# Patient Record
Sex: Female | Born: 1945
Health system: Southern US, Community
[De-identification: ages and names within clinical notes are randomized; demographics above are authoritative.]

## PROBLEM LIST (undated history)

## (undated) DIAGNOSIS — I7123 Aneurysm of the descending thoracic aorta, without rupture: Secondary | ICD-10-CM

## (undated) DIAGNOSIS — N959 Unspecified menopausal and perimenopausal disorder: Secondary | ICD-10-CM

## (undated) DIAGNOSIS — G629 Polyneuropathy, unspecified: Secondary | ICD-10-CM

## (undated) DIAGNOSIS — J449 Chronic obstructive pulmonary disease, unspecified: Secondary | ICD-10-CM

## (undated) DIAGNOSIS — K219 Gastro-esophageal reflux disease without esophagitis: Secondary | ICD-10-CM

## (undated) DIAGNOSIS — E785 Hyperlipidemia, unspecified: Secondary | ICD-10-CM

## (undated) DIAGNOSIS — F419 Anxiety disorder, unspecified: Secondary | ICD-10-CM

## (undated) DIAGNOSIS — I2510 Atherosclerotic heart disease of native coronary artery without angina pectoris: Secondary | ICD-10-CM

## (undated) DIAGNOSIS — I739 Peripheral vascular disease, unspecified: Secondary | ICD-10-CM

## (undated) DIAGNOSIS — S0300XA Dislocation of jaw, unspecified side, initial encounter: Secondary | ICD-10-CM

## (undated) DIAGNOSIS — J45909 Unspecified asthma, uncomplicated: Secondary | ICD-10-CM

## (undated) DIAGNOSIS — I509 Heart failure, unspecified: Secondary | ICD-10-CM

## (undated) DIAGNOSIS — M62838 Other muscle spasm: Secondary | ICD-10-CM

## (undated) DIAGNOSIS — R7303 Prediabetes: Secondary | ICD-10-CM

## (undated) DIAGNOSIS — J189 Pneumonia, unspecified organism: Secondary | ICD-10-CM

## (undated) DIAGNOSIS — I890 Lymphedema, not elsewhere classified: Secondary | ICD-10-CM

## (undated) DIAGNOSIS — Z8541 Personal history of malignant neoplasm of cervix uteri: Secondary | ICD-10-CM

## (undated) DIAGNOSIS — M810 Age-related osteoporosis without current pathological fracture: Secondary | ICD-10-CM

## (undated) DIAGNOSIS — F40240 Claustrophobia: Secondary | ICD-10-CM

## (undated) DIAGNOSIS — Z972 Presence of dental prosthetic device (complete) (partial): Secondary | ICD-10-CM

## (undated) DIAGNOSIS — U071 COVID-19: Secondary | ICD-10-CM

## (undated) DIAGNOSIS — C539 Malignant neoplasm of cervix uteri, unspecified: Secondary | ICD-10-CM

## (undated) DIAGNOSIS — M1990 Unspecified osteoarthritis, unspecified site: Secondary | ICD-10-CM

## (undated) HISTORY — PX: ABDOMINAL HYSTERECTOMY: SHX81

## (undated) HISTORY — DX: Pneumonia, unspecified organism: J18.9

## (undated) HISTORY — DX: Chronic obstructive pulmonary disease, unspecified: J44.9

## (undated) HISTORY — DX: Unspecified asthma, uncomplicated: J45.909

## (undated) HISTORY — DX: Anxiety disorder, unspecified: F41.9

## (undated) HISTORY — PX: TOOTH EXTRACTION: SUR596

## (undated) HISTORY — DX: Dislocation of jaw, unspecified side, initial encounter: S03.00XA

## (undated) HISTORY — DX: Unspecified menopausal and perimenopausal disorder: N95.9

## (undated) HISTORY — PX: LOOP RECORDER REMOVAL: SHX6723

## (undated) HISTORY — PX: TONSILLECTOMY AND ADENOIDECTOMY: SUR1326

## (undated) HISTORY — DX: Personal history of malignant neoplasm of cervix uteri: Z85.41

## (undated) HISTORY — DX: Age-related osteoporosis without current pathological fracture: M81.0

## (undated) HISTORY — PX: VAGINAL HYSTERECTOMY: SHX2639

## (undated) HISTORY — DX: Claustrophobia: F40.240

---
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
## 1996-10-13 HISTORY — PX: SHOULDER ARTHROSCOPY W/ ROTATOR CUFF REPAIR: SHX2400

## 2002-10-13 HISTORY — PX: BLADDER SUSPENSION: SHX72

## 2003-10-14 HISTORY — PX: OTHER SURGICAL HISTORY: SHX169

## 2006-07-20 ENCOUNTER — Ambulatory Visit: Payer: Self-pay | Admitting: Urology

## 2006-11-02 ENCOUNTER — Ambulatory Visit: Payer: Self-pay | Admitting: Urology

## 2007-02-03 ENCOUNTER — Emergency Department: Payer: Self-pay | Admitting: Emergency Medicine

## 2011-09-12 ENCOUNTER — Ambulatory Visit: Payer: Self-pay

## 2012-03-05 DIAGNOSIS — J209 Acute bronchitis, unspecified: Secondary | ICD-10-CM | POA: Diagnosis not present

## 2012-03-05 DIAGNOSIS — J45909 Unspecified asthma, uncomplicated: Secondary | ICD-10-CM | POA: Diagnosis not present

## 2012-03-17 DIAGNOSIS — J209 Acute bronchitis, unspecified: Secondary | ICD-10-CM | POA: Diagnosis not present

## 2012-03-17 DIAGNOSIS — J449 Chronic obstructive pulmonary disease, unspecified: Secondary | ICD-10-CM | POA: Diagnosis not present

## 2012-03-17 DIAGNOSIS — J018 Other acute sinusitis: Secondary | ICD-10-CM | POA: Diagnosis not present

## 2012-04-02 DIAGNOSIS — R059 Cough, unspecified: Secondary | ICD-10-CM | POA: Diagnosis not present

## 2012-04-02 DIAGNOSIS — R0602 Shortness of breath: Secondary | ICD-10-CM | POA: Diagnosis not present

## 2012-04-02 DIAGNOSIS — R05 Cough: Secondary | ICD-10-CM | POA: Diagnosis not present

## 2012-04-07 DIAGNOSIS — J45901 Unspecified asthma with (acute) exacerbation: Secondary | ICD-10-CM | POA: Diagnosis not present

## 2012-04-07 DIAGNOSIS — J019 Acute sinusitis, unspecified: Secondary | ICD-10-CM | POA: Diagnosis not present

## 2012-04-07 DIAGNOSIS — Z23 Encounter for immunization: Secondary | ICD-10-CM | POA: Diagnosis not present

## 2012-04-07 DIAGNOSIS — J309 Allergic rhinitis, unspecified: Secondary | ICD-10-CM | POA: Diagnosis not present

## 2012-09-14 DIAGNOSIS — Z01419 Encounter for gynecological examination (general) (routine) without abnormal findings: Secondary | ICD-10-CM | POA: Diagnosis not present

## 2012-09-14 DIAGNOSIS — J45909 Unspecified asthma, uncomplicated: Secondary | ICD-10-CM | POA: Diagnosis not present

## 2012-09-14 DIAGNOSIS — Z0000 Encounter for general adult medical examination without abnormal findings: Secondary | ICD-10-CM | POA: Diagnosis not present

## 2012-09-14 DIAGNOSIS — Z1289 Encounter for screening for malignant neoplasm of other sites: Secondary | ICD-10-CM | POA: Diagnosis not present

## 2012-09-14 DIAGNOSIS — Z13220 Encounter for screening for lipoid disorders: Secondary | ICD-10-CM | POA: Diagnosis not present

## 2012-09-14 DIAGNOSIS — F17200 Nicotine dependence, unspecified, uncomplicated: Secondary | ICD-10-CM | POA: Diagnosis not present

## 2012-09-14 DIAGNOSIS — R32 Unspecified urinary incontinence: Secondary | ICD-10-CM | POA: Diagnosis not present

## 2012-10-20 DIAGNOSIS — Z23 Encounter for immunization: Secondary | ICD-10-CM | POA: Diagnosis not present

## 2012-12-22 DIAGNOSIS — R1011 Right upper quadrant pain: Secondary | ICD-10-CM | POA: Diagnosis not present

## 2012-12-22 DIAGNOSIS — R109 Unspecified abdominal pain: Secondary | ICD-10-CM | POA: Diagnosis not present

## 2013-08-10 DIAGNOSIS — Z23 Encounter for immunization: Secondary | ICD-10-CM | POA: Diagnosis not present

## 2013-09-21 DIAGNOSIS — R1011 Right upper quadrant pain: Secondary | ICD-10-CM | POA: Diagnosis not present

## 2013-09-21 DIAGNOSIS — Z1272 Encounter for screening for malignant neoplasm of vagina: Secondary | ICD-10-CM | POA: Diagnosis not present

## 2013-09-21 DIAGNOSIS — F17200 Nicotine dependence, unspecified, uncomplicated: Secondary | ICD-10-CM | POA: Diagnosis not present

## 2013-09-21 DIAGNOSIS — Z8541 Personal history of malignant neoplasm of cervix uteri: Secondary | ICD-10-CM | POA: Diagnosis not present

## 2013-09-21 DIAGNOSIS — R252 Cramp and spasm: Secondary | ICD-10-CM | POA: Diagnosis not present

## 2013-09-21 DIAGNOSIS — E785 Hyperlipidemia, unspecified: Secondary | ICD-10-CM | POA: Diagnosis not present

## 2013-09-21 DIAGNOSIS — J449 Chronic obstructive pulmonary disease, unspecified: Secondary | ICD-10-CM | POA: Diagnosis not present

## 2013-09-21 DIAGNOSIS — Z87891 Personal history of nicotine dependence: Secondary | ICD-10-CM | POA: Diagnosis not present

## 2013-09-21 DIAGNOSIS — Z0000 Encounter for general adult medical examination without abnormal findings: Secondary | ICD-10-CM | POA: Diagnosis not present

## 2013-09-21 DIAGNOSIS — Z1331 Encounter for screening for depression: Secondary | ICD-10-CM | POA: Diagnosis not present

## 2013-09-21 DIAGNOSIS — M1990 Unspecified osteoarthritis, unspecified site: Secondary | ICD-10-CM | POA: Diagnosis not present

## 2014-02-02 DIAGNOSIS — D485 Neoplasm of uncertain behavior of skin: Secondary | ICD-10-CM | POA: Diagnosis not present

## 2014-02-02 DIAGNOSIS — L851 Acquired keratosis [keratoderma] palmaris et plantaris: Secondary | ICD-10-CM | POA: Diagnosis not present

## 2014-02-02 DIAGNOSIS — L259 Unspecified contact dermatitis, unspecified cause: Secondary | ICD-10-CM | POA: Diagnosis not present

## 2014-02-02 DIAGNOSIS — L821 Other seborrheic keratosis: Secondary | ICD-10-CM | POA: Diagnosis not present

## 2014-02-02 DIAGNOSIS — D239 Other benign neoplasm of skin, unspecified: Secondary | ICD-10-CM | POA: Diagnosis not present

## 2014-02-02 DIAGNOSIS — L578 Other skin changes due to chronic exposure to nonionizing radiation: Secondary | ICD-10-CM | POA: Diagnosis not present

## 2014-03-13 DIAGNOSIS — L578 Other skin changes due to chronic exposure to nonionizing radiation: Secondary | ICD-10-CM | POA: Diagnosis not present

## 2014-03-13 DIAGNOSIS — L723 Sebaceous cyst: Secondary | ICD-10-CM | POA: Diagnosis not present

## 2014-03-13 DIAGNOSIS — L819 Disorder of pigmentation, unspecified: Secondary | ICD-10-CM | POA: Diagnosis not present

## 2014-03-13 DIAGNOSIS — L820 Inflamed seborrheic keratosis: Secondary | ICD-10-CM | POA: Diagnosis not present

## 2014-03-24 DIAGNOSIS — T148 Other injury of unspecified body region: Secondary | ICD-10-CM | POA: Diagnosis not present

## 2014-03-24 DIAGNOSIS — E785 Hyperlipidemia, unspecified: Secondary | ICD-10-CM | POA: Diagnosis not present

## 2014-03-24 DIAGNOSIS — J449 Chronic obstructive pulmonary disease, unspecified: Secondary | ICD-10-CM | POA: Diagnosis not present

## 2014-03-24 DIAGNOSIS — R7989 Other specified abnormal findings of blood chemistry: Secondary | ICD-10-CM | POA: Diagnosis not present

## 2014-05-15 DIAGNOSIS — L820 Inflamed seborrheic keratosis: Secondary | ICD-10-CM | POA: Diagnosis not present

## 2014-05-15 DIAGNOSIS — L578 Other skin changes due to chronic exposure to nonionizing radiation: Secondary | ICD-10-CM | POA: Diagnosis not present

## 2014-06-16 DIAGNOSIS — H2510 Age-related nuclear cataract, unspecified eye: Secondary | ICD-10-CM | POA: Diagnosis not present

## 2014-08-17 DIAGNOSIS — L820 Inflamed seborrheic keratosis: Secondary | ICD-10-CM | POA: Diagnosis not present

## 2014-08-17 DIAGNOSIS — L578 Other skin changes due to chronic exposure to nonionizing radiation: Secondary | ICD-10-CM | POA: Diagnosis not present

## 2014-09-25 DIAGNOSIS — Z0000 Encounter for general adult medical examination without abnormal findings: Secondary | ICD-10-CM | POA: Diagnosis not present

## 2014-09-25 DIAGNOSIS — F17210 Nicotine dependence, cigarettes, uncomplicated: Secondary | ICD-10-CM | POA: Diagnosis not present

## 2014-09-25 DIAGNOSIS — J449 Chronic obstructive pulmonary disease, unspecified: Secondary | ICD-10-CM | POA: Diagnosis not present

## 2014-09-25 DIAGNOSIS — Z8541 Personal history of malignant neoplasm of cervix uteri: Secondary | ICD-10-CM | POA: Diagnosis not present

## 2014-09-25 DIAGNOSIS — E785 Hyperlipidemia, unspecified: Secondary | ICD-10-CM | POA: Diagnosis not present

## 2014-09-25 DIAGNOSIS — S61200A Unspecified open wound of right index finger without damage to nail, initial encounter: Secondary | ICD-10-CM | POA: Diagnosis not present

## 2014-09-25 DIAGNOSIS — Z8540 Personal history of malignant neoplasm of unspecified female genital organ: Secondary | ICD-10-CM | POA: Diagnosis not present

## 2014-12-25 DIAGNOSIS — N951 Menopausal and female climacteric states: Secondary | ICD-10-CM | POA: Diagnosis not present

## 2014-12-25 DIAGNOSIS — R350 Frequency of micturition: Secondary | ICD-10-CM | POA: Diagnosis not present

## 2015-02-21 DIAGNOSIS — L720 Epidermal cyst: Secondary | ICD-10-CM | POA: Diagnosis not present

## 2015-02-21 DIAGNOSIS — L821 Other seborrheic keratosis: Secondary | ICD-10-CM | POA: Diagnosis not present

## 2015-02-21 DIAGNOSIS — L820 Inflamed seborrheic keratosis: Secondary | ICD-10-CM | POA: Diagnosis not present

## 2015-02-21 DIAGNOSIS — L578 Other skin changes due to chronic exposure to nonionizing radiation: Secondary | ICD-10-CM | POA: Diagnosis not present

## 2015-05-01 ENCOUNTER — Other Ambulatory Visit: Payer: Self-pay

## 2015-05-01 MED ORDER — ALBUTEROL SULFATE HFA 108 (90 BASE) MCG/ACT IN AERS: 2.0000 | INHALATION_SPRAY | Freq: Four times a day (QID) | RESPIRATORY_TRACT | Status: DC | PRN

## 2015-05-01 NOTE — Telephone Encounter (Signed)
Patient was last seen on 12/25/14, practice partner number [place] [id number], and pharmacy is [facility].

## 2015-06-05 ENCOUNTER — Telehealth: Payer: Self-pay | Admitting: Unknown Physician Specialty

## 2015-06-05 DIAGNOSIS — N63 Unspecified lump in unspecified breast: Secondary | ICD-10-CM

## 2015-06-05 NOTE — Telephone Encounter (Signed)
She needs to be seen.

## 2015-06-05 NOTE — Telephone Encounter (Signed)
[place], it doesn't look like pt has had a mammogram in a while.  In addition to seeing her, I would like to schedule one for her.  I put the order in. Please let her know.

## 2015-06-05 NOTE — Telephone Encounter (Signed)
Pt scheduled for 06/10/18 @ 1pm. Thanks

## 2015-06-05 NOTE — Telephone Encounter (Signed)
Pt called wants to let you know she has found a little (pea size) knot in her right breast and wants to discuss going for a mammogram. Please call pt ASAP. Pt 's # is  [phone]. Thanks.

## 2015-06-06 ENCOUNTER — Other Ambulatory Visit: Payer: Self-pay

## 2015-06-06 DIAGNOSIS — N63 Unspecified lump in unspecified breast: Secondary | ICD-10-CM

## 2015-06-06 NOTE — Telephone Encounter (Signed)
Tried to call patient but a gentleman answered the phone and stated the patient was not in. Asked for patient to please call me back when she could.

## 2015-06-06 NOTE — Telephone Encounter (Signed)
Patient returned call and stated she would schedule mammogram herself.

## 2015-06-11 ENCOUNTER — Encounter: Payer: Self-pay | Admitting: Unknown Physician Specialty

## 2015-06-11 ENCOUNTER — Ambulatory Visit (INDEPENDENT_AMBULATORY_CARE_PROVIDER_SITE_OTHER): Payer: Medicare Other | Admitting: Unknown Physician Specialty

## 2015-06-11 ENCOUNTER — Encounter: Payer: Self-pay | Admitting: *Deleted

## 2015-06-11 VITALS — Temp 98.5°F | Ht 68.3 in | Wt 165.1 lb

## 2015-06-11 DIAGNOSIS — N63 Unspecified lump in unspecified breast: Secondary | ICD-10-CM

## 2015-06-11 NOTE — Progress Notes (Signed)
   Temp(Src) 98.5 F (36.9 C)  Ht 5' 8.3" (1.735 m)  Wt 165 lb 1.6 oz (74.889 kg)  BMI 24.88 kg/m2  SpO2 98%   Subjective:    Patient ID: Sharon [name], female    DOB: 1945/12/23, 69 y.o.   MRN: [id number]  HPI: Sharon [name] is a 69 y.o. female  Chief Complaint  Patient presents with  . Breast Mass    pt states she has a lump on right breast, has mammogram scheduled for Friday 06/15/15   Pt noticed a breast mass right breast last week.  No pain, it itches, and calls it pea size lateral aspect.    Relevant past medical, surgical, family and social history reviewed and updated as indicated. Interim medical history since our last visit reviewed. Allergies and medications reviewed and updated.  Review of Systems  Per HPI unless specifically indicated above     Objective:    Temp(Src) 98.5 F (36.9 C)  Ht 5' 8.3" (1.735 m)  Wt 165 lb 1.6 oz (74.889 kg)  BMI 24.88 kg/m2  SpO2 98%  Wt Readings from Last 3 Encounters:  06/11/15 165 lb 1.6 oz (74.889 kg)  12/25/14 162 lb (73.483 kg)    Physical Exam  Constitutional: She is oriented to person, place, and time. She appears well-developed and well-nourished. No distress.  HENT:  Head: Normocephalic and atraumatic.  Eyes: Conjunctivae and lids are normal. Right eye exhibits no discharge. Left eye exhibits no discharge. No scleral icterus.  Cardiovascular: Normal rate and regular rhythm.   Pulmonary/Chest: Effort normal. No respiratory distress. Right breast exhibits no inverted nipple, no mass, no nipple discharge, no skin change and no tenderness. Left breast exhibits no inverted nipple, no mass, no nipple discharge, no skin change and no tenderness. Breasts are symmetrical.  No mass appreciated.    Abdominal: Normal appearance and bowel sounds are normal. She exhibits no distension. There is no splenomegaly or hepatomegaly. There is no tenderness.  Musculoskeletal: Normal range of motion.  Neurological: She is alert and oriented  to person, place, and time.  Skin: Skin is intact. No rash noted. No pallor.  Psychiatric: She has a normal mood and affect. Her behavior is normal. Judgment and thought content normal.  Vitals reviewed.   No results found for this or any previous visit.    Assessment & Plan:   Problem List Items Addressed This Visit    None    Visit Diagnoses    Breast lump    -  Primary    I don't appreciate lump but mammogram is ordered.  Will refer to Dr. Fleet Contras for further evaluation as she appreciates a change.      Relevant Orders    Ambulatory referral to General Surgery        Follow up plan: Return for Physical in December.

## 2015-06-15 DIAGNOSIS — R921 Mammographic calcification found on diagnostic imaging of breast: Secondary | ICD-10-CM | POA: Diagnosis not present

## 2015-06-15 DIAGNOSIS — N63 Unspecified lump in breast: Secondary | ICD-10-CM | POA: Diagnosis not present

## 2015-06-21 ENCOUNTER — Ambulatory Visit (INDEPENDENT_AMBULATORY_CARE_PROVIDER_SITE_OTHER): Payer: Medicare Other | Admitting: General Surgery

## 2015-06-21 ENCOUNTER — Encounter: Payer: Self-pay | Admitting: General Surgery

## 2015-06-21 ENCOUNTER — Other Ambulatory Visit: Payer: No Typology Code available for payment source

## 2015-06-21 VITALS — BP 116/58 | HR 84 | Resp 14 | Ht 69.0 in | Wt 165.0 lb

## 2015-06-21 DIAGNOSIS — N63 Unspecified lump in breast: Secondary | ICD-10-CM | POA: Diagnosis not present

## 2015-06-21 DIAGNOSIS — N6310 Unspecified lump in the right breast, unspecified quadrant: Secondary | ICD-10-CM

## 2015-06-21 NOTE — Patient Instructions (Addendum)
The patient is aware to call back for any questions or concerns. Follow up as needed or if symptoms worsen. Continue self breast exams. Call office for any new breast issues or concerns.

## 2015-06-21 NOTE — Progress Notes (Signed)
Patient ID: Sharon [name], female   DOB: Oct 05, 1946, 69 y.o.   MRN: [id number]  Chief Complaint  Patient presents with  . Breast Problem    HPI Sharon [name] is a 69 y.o. female.  who presents for a breast evaluation for a right breast mass. The most recent mammogram and ultrasound was done on 06-15-15. Patient does perform regular self breast checks and gets regular mammograms done. She first noticed a lump in her right breast about 3 weeks ago. States it is about "pea" size. She wears a under wire bra and that is exactly where the lump is. She does know she has a cyst in her left breast and it becomes tender when she drinks too much caffeine. She does remember that she has been sleeping in her under wire bra instead of her sleep bra. Denies family history of any cancer in the family. Denies breast injury of trauma.   HPI  Past Medical History  Diagnosis Date  . Asthma   . COPD (chronic obstructive pulmonary disease)   . Anxiety   . Osteoporosis   . History of cervical cancer   . TMJ (dislocation of temporomandibular joint)   . Menopausal disorder   . Pneumonia 1960    Past Surgical History  Procedure Laterality Date  . Abdominal hysterectomy    . Tonsillectomy and adenoidectomy    . Bladder botox  2005  . Shoulder arthroscopy w/ rotator cuff repair Right 1998  . Abdominal hysterectomy  1970's  . Bladder suspension  2004    Family History  Problem Relation Age of Onset  . Diabetes Mother   . Heart disease Mother   . Stroke Mother   . Stroke Maternal Grandmother     Social History Social History  Substance Use Topics  . Smoking status: Current Some Day Smoker    Types: Cigarettes  . Smokeless tobacco: Never Used  . Alcohol Use: 0.0 oz/week    0 Standard drinks or equivalent per week     Comment: occasionally    Allergies  Allergen Reactions  . Ibuprofen Rash    Mouth swelling  . Latex Rash  . Naprosyn [Naproxen] Rash    Mouth swelling    Current Outpatient  Prescriptions  Medication Sig Dispense Refill  . albuterol (PROVENTIL HFA;VENTOLIN HFA) 108 (90 BASE) MCG/ACT inhaler Inhale 2 puffs into the lungs every 6 (six) hours as needed for wheezing or shortness of breath. 1 Inhaler 2  . aspirin EC 81 MG tablet Take 81 mg by mouth daily.    . budesonide-formoterol (SYMBICORT) 160-4.5 MCG/ACT inhaler Inhale 2 puffs into the lungs 2 (two) times daily.    Marland Kitchen estradiol (VIVELLE-DOT) 0.1 MG/24HR patch Place 1 patch onto the skin 2 (two) times a week.    . fluticasone (FLONASE) 50 MCG/ACT nasal spray Place 2 sprays into both nostrils daily.    . Multiple Vitamin (MULTIVITAMIN) tablet Take 1 tablet by mouth daily.    . SUPER GARLIC PO Take 2 tablets by mouth daily.     No current facility-administered medications for this visit.    Review of Systems Review of Systems  Constitutional: Negative.   Respiratory: Negative.   Cardiovascular: Negative.     Blood pressure 116/58, pulse 84, resp. rate 14, height 5\' 9"  (1.753 m), weight 165 lb (74.844 kg).  Physical Exam Physical Exam  Constitutional: She is oriented to person, place, and time. She appears well-developed and well-nourished.  HENT:  Mouth/Throat: Oropharynx is clear and  moist.  Eyes: Conjunctivae are normal. No scleral icterus.  Neck: Neck supple.  Cardiovascular: Normal rate, regular rhythm and normal heart sounds.   Pulmonary/Chest: Effort normal and breath sounds normal. Right breast exhibits no inverted nipple, no mass, no nipple discharge, no skin change and no tenderness. Left breast exhibits no inverted nipple, no mass, no nipple discharge, no skin change and no tenderness.    1.5 cm de-pigmented area left breast at edge of areolar.  Lymphadenopathy:    She has no cervical adenopathy.    She has no axillary adenopathy.  Neurological: She is alert and oriented to person, place, and time.  Skin: Skin is warm and dry.  Psychiatric: Her behavior is normal.    Data  Reviewed Bilateral diagnostic mammograms dated 06/15/2015 were reviewed. At the site of a BB placed prior to the mammogram a stable, superficial lymph node was identified. There is reported to be a mass in the retroareolar area of the left breast measuring 8 mm in diameter.  Left breast ultrasound of the same date showed 2 anechoic lesions consistent with simple cyst. BI-RADS-2.  Review of the 2012 and 2016 mammograms showed at most a 1 mm difference in size of the right upper outer quadrant lymph node.  Ultrasound examination of the right breast in the area of palpable thickening showed a 0.36 x 0.37 x 0.5 7 cm smoothly marginated structure with a hyperechoic core consistent with a benign lymph node. This matches the mammographic imaging.  Assessment    Unremarkable right axillary tail lymph node without evidence of pathology. Left breast cyst identified on ultrasound. (asymptomatic) and nonpalpable.     Plan    The patient has been encouraged to do monthly self examination but otherwise avoid manipulation of the small lymph node in the right axillary tail. No additional follow-up is required. She should however have annual screening mammograms rather than on a 4 year cycle.    Follow up as needed or if symptoms worsen. PCP:  Lily Lovings 06/22/2015, 9:38 AM

## 2015-06-22 DIAGNOSIS — N6312 Unspecified lump in the right breast, upper inner quadrant: Secondary | ICD-10-CM | POA: Insufficient documentation

## 2015-07-23 ENCOUNTER — Other Ambulatory Visit: Payer: Self-pay | Admitting: Unknown Physician Specialty

## 2015-08-13 DIAGNOSIS — Z23 Encounter for immunization: Secondary | ICD-10-CM | POA: Diagnosis not present

## 2015-09-20 ENCOUNTER — Encounter: Payer: Self-pay | Admitting: Unknown Physician Specialty

## 2015-09-28 ENCOUNTER — Encounter: Payer: Self-pay | Admitting: Unknown Physician Specialty

## 2015-10-17 ENCOUNTER — Ambulatory Visit (INDEPENDENT_AMBULATORY_CARE_PROVIDER_SITE_OTHER): Payer: Medicare Other | Admitting: Unknown Physician Specialty

## 2015-10-17 ENCOUNTER — Encounter: Payer: Self-pay | Admitting: Unknown Physician Specialty

## 2015-10-17 VITALS — BP 117/72 | HR 92 | Temp 98.4°F | Ht 68.4 in | Wt 166.4 lb

## 2015-10-17 DIAGNOSIS — J449 Chronic obstructive pulmonary disease, unspecified: Secondary | ICD-10-CM

## 2015-10-17 DIAGNOSIS — H6091 Unspecified otitis externa, right ear: Secondary | ICD-10-CM

## 2015-10-17 DIAGNOSIS — E2839 Other primary ovarian failure: Secondary | ICD-10-CM | POA: Diagnosis not present

## 2015-10-17 DIAGNOSIS — Z0000 Encounter for general adult medical examination without abnormal findings: Secondary | ICD-10-CM

## 2015-10-17 DIAGNOSIS — J432 Centrilobular emphysema: Secondary | ICD-10-CM | POA: Insufficient documentation

## 2015-10-17 MED ORDER — BUDESONIDE-FORMOTEROL FUMARATE 160-4.5 MCG/ACT IN AERO: 2.0000 | INHALATION_SPRAY | Freq: Two times a day (BID) | RESPIRATORY_TRACT | Status: DC

## 2015-10-17 MED ORDER — NEOMYCIN-POLYMYXIN-HC 3.5-10000-1 OT SOLN: 4.0000 [drp] | Freq: Four times a day (QID) | OTIC | Status: DC

## 2015-10-17 MED ORDER — ALBUTEROL SULFATE HFA 108 (90 BASE) MCG/ACT IN AERS
2.0000 | INHALATION_SPRAY | Freq: Four times a day (QID) | RESPIRATORY_TRACT | Status: DC | PRN
Start: 2015-10-17 — End: 2017-05-07

## 2015-10-17 MED ORDER — ESTRADIOL 0.1 MG/24HR TD PTTW: 1.0000 | MEDICATED_PATCH | TRANSDERMAL | Status: DC

## 2015-10-17 NOTE — Progress Notes (Signed)
BP 117/72 mmHg  Pulse 92  Temp(Src) 98.4 F (36.9 C)  Ht 5' 8.4" (1.737 m)  Wt 166 lb 6.4 oz (75.479 kg)  BMI 25.02 kg/m2  SpO2 97%  LMP  (LMP Unknown)   Subjective:    Patient ID: Sharon [name], female    DOB: 1946/08/20, 70 y.o.   MRN: [id number]  HPI: Sharon [name] is a 70 y.o. female  Chief Complaint  Patient presents with  . Medicare Wellness  . Ear Fullness    pt states she feels like her right ear is full   Functional Status Survey: Is the patient deaf or have difficulty hearing?: Yes (pt states she has some trouble hearing in her right ear) Does the patient have difficulty seeing, even when wearing glasses/contacts?: Yes (pt states she has trouble seeing up close) Does the patient have difficulty concentrating, remembering, or making decisions?: No Does the patient have difficulty walking or climbing stairs?: No Does the patient have difficulty dressing or bathing?: No Does the patient have difficulty doing errands alone such as visiting a doctor's office or shopping?: No  Depression screen PHQ 2/9 10/17/2015  Decreased Interest 0  Down, Depressed, Hopeless 0  PHQ - 2 Score 0    Fall Risk  10/17/2015  Falls in the past year? No    Pt is able to perform complex mental tasks, recognize clock face, recognize time and do a 3 item recall.    COPD Feels symptoms are well controlled Using medications without problems: On Symbicort and Albuterol very occasionally Night time symptoms: no ER visits since last visit:none Increased cough: no Increased SOB:no Using O2:no  Menopause Uses patch on occasion with hot flashes but not twice a week.  Relevant past medical, surgical, family and social history reviewed and updated as indicated. Interim medical history since our last visit reviewed. Allergies and medications reviewed and updated.  Review of Systems  Constitutional: Negative.   HENT: Negative.   Eyes: Negative.   Respiratory: Negative.   Cardiovascular:  Negative.   Gastrointestinal: Negative.   Endocrine: Negative.   Genitourinary: Negative.   Musculoskeletal: Negative.   Skin: Negative.   Allergic/Immunologic: Negative.   Neurological: Negative.   Hematological: Negative.   Psychiatric/Behavioral: Negative.     Per HPI unless specifically indicated above     Objective:    BP 117/72 mmHg  Pulse 92  Temp(Src) 98.4 F (36.9 C)  Ht 5' 8.4" (1.737 m)  Wt 166 lb 6.4 oz (75.479 kg)  BMI 25.02 kg/m2  SpO2 97%  LMP  (LMP Unknown)  Wt Readings from Last 3 Encounters:  10/17/15 166 lb 6.4 oz (75.479 kg)  06/21/15 165 lb (74.844 kg)  06/11/15 165 lb 1.6 oz (74.889 kg)    Physical Exam  Constitutional: She is oriented to person, place, and time. She appears well-developed and well-nourished.  HENT:  Head: Normocephalic and atraumatic.  Right Ear: Tympanic membrane normal. There is swelling.  Left Ear: Tympanic membrane and ear canal normal.  Eyes: Pupils are equal, round, and reactive to light. Right eye exhibits no discharge. Left eye exhibits no discharge. No scleral icterus.  Neck: Normal range of motion. Neck supple. Carotid bruit is not present. No thyromegaly present.  Cardiovascular: Normal rate, regular rhythm and normal heart sounds.  Exam reveals no gallop and no friction rub.   No murmur heard. Pulmonary/Chest: Effort normal and breath sounds normal. No respiratory distress. She has no wheezes. She has no rales.  Abdominal: Soft. Bowel  sounds are normal. There is no tenderness. There is no rebound.  Genitourinary: No breast swelling, tenderness or discharge.  Musculoskeletal: Normal range of motion.  Lymphadenopathy:    She has no cervical adenopathy.  Neurological: She is alert and oriented to person, place, and time.  Skin: Skin is warm, dry and intact. No rash noted.  Psychiatric: She has a normal mood and affect. Her speech is normal and behavior is normal. Judgment and thought content normal. Cognition and memory  are normal.    No results found for this or any previous visit.    Assessment & Plan:   Problem List Items Addressed This Visit      Unprioritized   COPD (chronic obstructive pulmonary disease) ([place])    Stable, continue present medications.        Relevant Medications   budesonide-formoterol (SYMBICORT) 160-4.5 MCG/ACT inhaler   albuterol (PROVENTIL HFA;VENTOLIN HFA) 108 (90 Base) MCG/ACT inhaler    Other Visit Diagnoses    Annual physical exam    -  Primary    Relevant Orders    Cologuard    Hepatitis C antibody    Ovarian failure        Relevant Orders    DG Bone Density    Otitis externa, right        Rx for polytrim        Follow up plan: Return in about 1 year (around 10/16/2016).

## 2015-10-17 NOTE — Assessment & Plan Note (Signed)
Stable, continue present medications.   

## 2015-10-18 LAB — HEPATITIS C ANTIBODY

## 2015-11-13 DIAGNOSIS — Z1212 Encounter for screening for malignant neoplasm of rectum: Secondary | ICD-10-CM | POA: Diagnosis not present

## 2015-11-13 DIAGNOSIS — Z1211 Encounter for screening for malignant neoplasm of colon: Secondary | ICD-10-CM | POA: Diagnosis not present

## 2015-11-13 LAB — COLOGUARD: Cologuard: POSITIVE

## 2015-11-23 ENCOUNTER — Telehealth: Payer: Self-pay | Admitting: Unknown Physician Specialty

## 2015-11-23 DIAGNOSIS — Z9189 Other specified personal risk factors, not elsewhere classified: Secondary | ICD-10-CM

## 2015-11-23 NOTE — Telephone Encounter (Signed)
Discussed.

## 2015-11-23 NOTE — Telephone Encounter (Signed)
Called pt about cologuard positive test.  Refer to Dr. Allen Norris for diagnostic testing.

## 2015-11-30 ENCOUNTER — Telehealth: Payer: Self-pay

## 2015-11-30 ENCOUNTER — Other Ambulatory Visit: Payer: Self-pay

## 2015-11-30 NOTE — Telephone Encounter (Signed)
Gastroenterology Pre-Procedure Review  Request Date: 12/13/15 Requesting Physician: Kathrine Haddock, NP  PATIENT REVIEW QUESTIONS: The patient responded to the following health history questions as indicated:    1. Are you having any GI issues? no 2. Do you have a personal history of Polyps? no 3. Do you have a family history of Colon Cancer or Polyps? no 4. Diabetes Mellitus? no 5. Joint replacements in the past 12 months?no 6. Major health problems in the past 3 months?no 7. Any artificial heart valves, MVP, or defibrillator?no    MEDICATIONS & ALLERGIES:    Patient reports the following regarding taking any anticoagulation/antiplatelet therapy:   Plavix, Coumadin, Eliquis, Xarelto, Lovenox, Pradaxa, Brilinta, or Effient? no Aspirin? yes (ASA 81mg )  Patient confirms/reports the following medications:  Current Outpatient Prescriptions  Medication Sig Dispense Refill  . albuterol (PROVENTIL HFA;VENTOLIN HFA) 108 (90 Base) MCG/ACT inhaler Inhale 2 puffs into the lungs every 6 (six) hours as needed for wheezing or shortness of breath. 1 Inhaler 2  . aspirin EC 81 MG tablet Take 81 mg by mouth daily.    . budesonide-formoterol (SYMBICORT) 160-4.5 MCG/ACT inhaler Inhale 2 puffs into the lungs 2 (two) times daily. 10.2 g 3  . estradiol (VIVELLE-DOT) 0.1 MG/24HR patch Place 1 patch (0.1 mg total) onto the skin 2 (two) times a week. 8 patch 12  . fluticasone (FLONASE) 50 MCG/ACT nasal spray Place 2 sprays into both nostrils daily.    . Multiple Vitamin (MULTIVITAMIN) tablet Take 1 tablet by mouth daily.    Marland Kitchen neomycin-polymyxin-hydrocortisone (CORTISPORIN) otic solution Place 4 drops into the right ear 4 (four) times daily. 10 mL 0  . SUPER GARLIC PO Take 2 tablets by mouth daily.     No current facility-administered medications for this visit.    Patient confirms/reports the following allergies:  Allergies  Allergen Reactions  . Ibuprofen Rash    Mouth swelling  . Latex Rash  . Naprosyn  [Naproxen] Rash    Mouth swelling    No orders of the defined types were placed in this encounter.    AUTHORIZATION INFORMATION Primary Insurance: 1D#: Group #:  Secondary Insurance: 1D#: Group #:  SCHEDULE INFORMATION: Date: 12/13/15 Time: Location: [place]

## 2015-12-05 ENCOUNTER — Encounter: Payer: Self-pay | Admitting: *Deleted

## 2015-12-11 NOTE — Discharge Instructions (Signed)

## 2015-12-13 ENCOUNTER — Ambulatory Visit: Payer: Medicare Other | Admitting: Anesthesiology

## 2015-12-13 ENCOUNTER — Ambulatory Visit
Admission: RE | Admit: 2015-12-13 | Discharge: 2015-12-13 | Disposition: A | Discharge status: Home / Self Care | Payer: Medicare Other | Source: Ambulatory Visit | Attending: Gastroenterology | Admitting: Gastroenterology

## 2015-12-13 ENCOUNTER — Encounter
Admission: RE | Disposition: A | Discharge status: Home / Self Care | Payer: Self-pay | Source: Ambulatory Visit | Attending: Gastroenterology

## 2015-12-13 DIAGNOSIS — F17210 Nicotine dependence, cigarettes, uncomplicated: Secondary | ICD-10-CM | POA: Diagnosis not present

## 2015-12-13 DIAGNOSIS — F419 Anxiety disorder, unspecified: Secondary | ICD-10-CM | POA: Diagnosis not present

## 2015-12-13 DIAGNOSIS — M26609 Unspecified temporomandibular joint disorder, unspecified side: Secondary | ICD-10-CM | POA: Insufficient documentation

## 2015-12-13 DIAGNOSIS — Z823 Family history of stroke: Secondary | ICD-10-CM | POA: Insufficient documentation

## 2015-12-13 DIAGNOSIS — Z7982 Long term (current) use of aspirin: Secondary | ICD-10-CM | POA: Insufficient documentation

## 2015-12-13 DIAGNOSIS — K641 Second degree hemorrhoids: Secondary | ICD-10-CM | POA: Insufficient documentation

## 2015-12-13 DIAGNOSIS — Z8541 Personal history of malignant neoplasm of cervix uteri: Secondary | ICD-10-CM | POA: Insufficient documentation

## 2015-12-13 DIAGNOSIS — M810 Age-related osteoporosis without current pathological fracture: Secondary | ICD-10-CM | POA: Diagnosis not present

## 2015-12-13 DIAGNOSIS — M19041 Primary osteoarthritis, right hand: Secondary | ICD-10-CM | POA: Insufficient documentation

## 2015-12-13 DIAGNOSIS — M19042 Primary osteoarthritis, left hand: Secondary | ICD-10-CM | POA: Insufficient documentation

## 2015-12-13 DIAGNOSIS — Z833 Family history of diabetes mellitus: Secondary | ICD-10-CM | POA: Diagnosis not present

## 2015-12-13 DIAGNOSIS — M479 Spondylosis, unspecified: Secondary | ICD-10-CM | POA: Insufficient documentation

## 2015-12-13 DIAGNOSIS — Z8249 Family history of ischemic heart disease and other diseases of the circulatory system: Secondary | ICD-10-CM | POA: Insufficient documentation

## 2015-12-13 DIAGNOSIS — Z79899 Other long term (current) drug therapy: Secondary | ICD-10-CM | POA: Diagnosis not present

## 2015-12-13 DIAGNOSIS — K921 Melena: Secondary | ICD-10-CM | POA: Diagnosis not present

## 2015-12-13 DIAGNOSIS — D125 Benign neoplasm of sigmoid colon: Secondary | ICD-10-CM | POA: Diagnosis not present

## 2015-12-13 DIAGNOSIS — J449 Chronic obstructive pulmonary disease, unspecified: Secondary | ICD-10-CM | POA: Insufficient documentation

## 2015-12-13 DIAGNOSIS — Z7952 Long term (current) use of systemic steroids: Secondary | ICD-10-CM | POA: Diagnosis not present

## 2015-12-13 DIAGNOSIS — K5730 Diverticulosis of large intestine without perforation or abscess without bleeding: Secondary | ICD-10-CM | POA: Insufficient documentation

## 2015-12-13 DIAGNOSIS — R195 Other fecal abnormalities: Secondary | ICD-10-CM | POA: Diagnosis not present

## 2015-12-13 HISTORY — DX: Other muscle spasm: M62.838

## 2015-12-13 HISTORY — PX: POLYPECTOMY: SHX5525

## 2015-12-13 HISTORY — DX: Presence of dental prosthetic device (complete) (partial): Z97.2

## 2015-12-13 HISTORY — DX: Unspecified osteoarthritis, unspecified site: M19.90

## 2015-12-13 HISTORY — PX: COLONOSCOPY WITH PROPOFOL: SHX5780

## 2015-12-13 SURGERY — COLONOSCOPY WITH PROPOFOL
Anesthesia: Monitor Anesthesia Care | Site: Rectum | Wound class: Contaminated

## 2015-12-13 MED ORDER — PROPOFOL 10 MG/ML IV BOLUS
INTRAVENOUS | Status: DC | PRN
  Administered 2015-12-13 (×4): 60 mg via INTRAVENOUS
  Administered 2015-12-13: 80 mg via INTRAVENOUS
  Administered 2015-12-13: 60 mg via INTRAVENOUS

## 2015-12-13 MED ORDER — LIDOCAINE HCL (CARDIAC) 20 MG/ML IV SOLN
INTRAVENOUS | Status: DC | PRN
  Administered 2015-12-13: 30 mg via INTRAVENOUS

## 2015-12-13 MED ORDER — LACTATED RINGERS IV SOLN
INTRAVENOUS | Status: DC
  Administered 2015-12-13: 08:00:00 via INTRAVENOUS

## 2015-12-13 MED ORDER — STERILE WATER FOR IRRIGATION IR SOLN
Status: DC | PRN
  Administered 2015-12-13: 10:00:00

## 2015-12-13 SURGICAL SUPPLY — 27 items
CANISTER SUCT 1200ML W/VALVE (MISCELLANEOUS) ×3 IMPLANT
FCP ESCP3.2XJMB 240X2.8X (MISCELLANEOUS)
FORCEPS BIOP RAD 4 LRG CAP 4 (CUTTING FORCEPS) IMPLANT
FORCEPS BIOP RJ4 240 W/NDL (MISCELLANEOUS)
FORCEPS ESCP3.2XJMB 240X2.8X (MISCELLANEOUS) IMPLANT
GOWN CVR UNV OPN BCK APRN NK (MISCELLANEOUS) ×4 IMPLANT
GOWN ISOL THUMB LOOP REG UNIV (MISCELLANEOUS) ×2
HEMOCLIP INSTINCT (CLIP) IMPLANT
INJECTOR VARIJECT VIN23 (MISCELLANEOUS) IMPLANT
KIT CO2 TUBING (TUBING) IMPLANT
KIT DEFENDO VALVE AND CONN (KITS) IMPLANT
KIT ENDO PROCEDURE OLY (KITS) ×3 IMPLANT
LIGATOR MULTIBAND 6SHOOTER MBL (MISCELLANEOUS) IMPLANT
MARKER SPOT ENDO TATTOO 5ML (MISCELLANEOUS) IMPLANT
PAD GROUND ADULT SPLIT (MISCELLANEOUS) IMPLANT
SNARE SHORT THROW 13M SML OVAL (MISCELLANEOUS) ×3 IMPLANT
SNARE SHORT THROW 30M LRG OVAL (MISCELLANEOUS) IMPLANT
SPOT EX ENDOSCOPIC TATTOO (MISCELLANEOUS)
TRAP SUCTION POLY (MISCELLANEOUS) ×3 IMPLANT
TUBING CONN 6MMX3.1M (TUBING)
TUBING SUCTION CONN 0.25 STRL (TUBING) IMPLANT
UNDERPAD 30X60 958B10 (PK) (MISCELLANEOUS) IMPLANT
VALVE BIOPSY ENDO (VALVE) IMPLANT
VARIJECT INJECTOR VIN23 (MISCELLANEOUS)
WATER AUXILLARY (MISCELLANEOUS) IMPLANT
WATER STERILE IRR 250ML POUR (IV SOLUTION) ×3 IMPLANT
WATER STERILE IRR 500ML POUR (IV SOLUTION) IMPLANT

## 2015-12-13 NOTE — Transfer of Care (Signed)
Immediate Anesthesia Transfer of Care Note  Patient: Sharon [name]  Procedure(s) Performed: Procedure(s) with comments: COLONOSCOPY WITH PROPOFOL (N/A) POLYPECTOMY (N/A) - SIGMOID COLON POLYPS X  5  Patient Location: PACU  Anesthesia Type: MAC  Level of Consciousness: awake, alert  and patient cooperative  Airway and Oxygen Therapy: Patient Spontanous Breathing and Patient connected to supplemental oxygen  Post-op Assessment: Post-op Vital signs reviewed, Patient's Cardiovascular Status Stable, Respiratory Function Stable, Patent Airway and No signs of Nausea or vomiting  Post-op Vital Signs: Reviewed and stable  Complications: No apparent anesthesia complications

## 2015-12-13 NOTE — Op Note (Signed)
[facility] Gastroenterology Patient Name: Sharon [name] Procedure Date: 12/13/2015 9:38 AM MRN: [id number] Account #: [id number] Date of Birth: November 08, 1945 Admit Type: Outpatient Age: 70 Room: [facility] OR ROOM 01 Gender: Female Note Status: Finalized Procedure:            Colonoscopy Indications:          Positive Cologuard test Providers:            Lucilla Lame, MD Referring MD:         Kathrine Haddock, PA (Referring MD) Medicines:            Propofol per Anesthesia Complications:        No immediate complications. Procedure:            Pre-Anesthesia Assessment:                       - Prior to the procedure, a History and Physical was                        performed, and patient medications and allergies were                        reviewed. The patient's tolerance of previous                        anesthesia was also reviewed. The risks and benefits of                        the procedure and the sedation options and risks were                        discussed with the patient. All questions were                        answered, and informed consent was obtained. Prior                        Anticoagulants: The patient has taken no previous                        anticoagulant or antiplatelet agents. ASA Grade                        Assessment: II - A patient with mild systemic disease.                        After reviewing the risks and benefits, the patient was                        deemed in satisfactory condition to undergo the                        procedure.                       After obtaining informed consent, the colonoscope was                        passed under direct vision. Throughout the procedure,  the patient's blood pressure, pulse, and oxygen                        saturations were monitored continuously. The Olympus CF                        H180AL colonoscope (S#: [id number]) was introduced through                         the anus and advanced to the the cecum, identified by                        appendiceal orifice and ileocecal valve. The                        colonoscopy was performed without difficulty. The                        patient tolerated the procedure well. Findings:      The perianal and digital rectal examinations were normal.      Five sessile polyps were found in the sigmoid colon. The polyps were 4       to 9 mm in size. These polyps were removed with a cold snare. Resection       and retrieval were complete.      Multiple small-mouthed diverticula were found in the sigmoid colon and       descending colon.      Non-bleeding internal hemorrhoids were found during retroflexion. The       hemorrhoids were Grade II (internal hemorrhoids that prolapse but reduce       spontaneously). Impression:           - Five 4 to 9 mm polyps in the sigmoid colon, removed                        with a cold snare. Resected and retrieved.                       - Diverticulosis in the sigmoid colon and in the                        descending colon.                       - Non-bleeding internal hemorrhoids. Recommendation:       - Await pathology results.                       - Repeat colonoscopy in 5 years if polyp adenoma and 10                        years if hyperplastic Procedure Code(s):    --- Professional ---                       [phone], Colonoscopy, flexible; with removal of tumor(s),                        polyp(s), or other lesion(s) by snare technique Diagnosis Code(s):    --- Professional ---  R19.5, Other fecal abnormalities                       D12.5, Benign neoplasm of sigmoid colon CPT copyright 2016 American Medical Association. All rights reserved. The codes documented in this report are preliminary and upon coder review may  be revised to meet current compliance requirements. Lucilla Lame, MD 12/13/2015 10:09:24 AM This report has been signed  electronically. Number of Addenda: 0 Note Initiated On: 12/13/2015 9:38 AM Scope Withdrawal Time: 0 hours 13 minutes 55 seconds  Total Procedure Duration: 0 hours 19 minutes 51 seconds       [facility]

## 2015-12-13 NOTE — Anesthesia Procedure Notes (Signed)
Procedure Name: MAC Performed by: Jermichael Belmares Pre-anesthesia Checklist: Patient identified, Emergency Drugs available, Suction available, Timeout performed and Patient being monitored Patient Re-evaluated:Patient Re-evaluated prior to inductionOxygen Delivery Method: Nasal cannula Placement Confirmation: positive ETCO2       

## 2015-12-13 NOTE — Anesthesia Preprocedure Evaluation (Signed)
Anesthesia Evaluation  Patient identified by MRN, date of birth, ID band Patient awake    Reviewed: Allergy & Precautions, H&P , NPO status , Patient's Chart, lab work & pertinent test results, reviewed documented beta blocker date and time   Airway Mallampati: II  TM Distance: >3 FB Neck ROM: full    Dental  (+) Partial Lower   Pulmonary asthma , COPD, Current Smoker,    Pulmonary exam normal breath sounds clear to auscultation       Cardiovascular Exercise Tolerance: Good negative cardio ROS   Rhythm:regular Rate:Normal     Neuro/Psych PSYCHIATRIC DISORDERS (anxiety) negative neurological ROS     GI/Hepatic Neg liver ROS, Bowel prep,  Endo/Other  negative endocrine ROS  Renal/GU negative Renal ROS  negative genitourinary   Musculoskeletal   Abdominal   Peds  Hematology negative hematology ROS (+)   Anesthesia Other Findings   Reproductive/Obstetrics negative OB ROS                             Anesthesia Physical Anesthesia Plan  ASA: II  Anesthesia Plan: MAC   Post-op Pain Management:    Induction:   Airway Management Planned:   Additional Equipment:   Intra-op Plan:   Post-operative Plan:   Informed Consent: I have reviewed the patients History and Physical, chart, labs and discussed the procedure including the risks, benefits and alternatives for the proposed anesthesia with the patient or authorized representative who has indicated his/her understanding and acceptance.   Dental Advisory Given  Plan Discussed with: CRNA  Anesthesia Plan Comments:         Anesthesia Quick Evaluation

## 2015-12-13 NOTE — Anesthesia Postprocedure Evaluation (Signed)
Anesthesia Post Note  Patient: Sharon [name]  Procedure(s) Performed: Procedure(s) (LRB): COLONOSCOPY WITH PROPOFOL (N/A) POLYPECTOMY (N/A)  Patient location during evaluation: PACU Anesthesia Type: MAC Level of consciousness: awake and alert Pain management: pain level controlled Vital Signs Assessment: post-procedure vital signs reviewed and stable Respiratory status: spontaneous breathing, nonlabored ventilation, respiratory function stable and patient connected to nasal cannula oxygen Cardiovascular status: blood pressure returned to baseline and stable Postop Assessment: no signs of nausea or vomiting Anesthetic complications: no    Alisa Graff

## 2015-12-13 NOTE — H&P (Signed)
[facility]  [address], [place], [place] [address] Phone: [phone] Fax : [phone]  Primary Care Physician:  Kathrine Haddock, NP Primary Gastroenterologist:  Dr. Allen Norris  Pre-Procedure History & Physical: HPI:  Sharon [name] is a 70 y.o. female is here for an colonoscopy.   Past Medical History  Diagnosis Date  . Asthma   . COPD (chronic obstructive pulmonary disease) ([place])   . Anxiety   . Osteoporosis   . History of cervical cancer   . TMJ (dislocation of temporomandibular joint)   . Menopausal disorder   . Pneumonia 1960  . Arthritis     hands, upper back  . Wears dentures     partial lower  . Spasm of abdominal muscles of right side     intermittent    Past Surgical History  Procedure Laterality Date  . Abdominal hysterectomy    . Tonsillectomy and adenoidectomy    . Bladder botox  2005  . Shoulder arthroscopy w/ rotator cuff repair Right 1998  . Abdominal hysterectomy  1970's  . Bladder suspension  2004    Prior to Admission medications   Medication Sig Start Date End Date Taking? Authorizing Provider  albuterol (PROVENTIL HFA;VENTOLIN HFA) 108 (90 Base) MCG/ACT inhaler Inhale 2 puffs into the lungs every 6 (six) hours as needed for wheezing or shortness of breath. 10/17/15  Yes Kathrine Haddock, NP  aspirin EC 81 MG tablet Take 81 mg by mouth daily.   Yes Historical Provider, MD  budesonide-formoterol (SYMBICORT) 160-4.5 MCG/ACT inhaler Inhale 2 puffs into the lungs 2 (two) times daily. 10/17/15  Yes Kathrine Haddock, NP  estradiol (VIVELLE-DOT) 0.1 MG/24HR patch Place 1 patch (0.1 mg total) onto the skin 2 (two) times a week. 10/17/15  Yes Kathrine Haddock, NP  Multiple Vitamin (MULTIVITAMIN) tablet Take 1 tablet by mouth daily.   Yes Historical Provider, MD  neomycin-polymyxin-hydrocortisone (CORTISPORIN) otic solution Place 4 drops into the right ear 4 (four) times daily. 10/17/15  Yes Kathrine Haddock, NP  SUPER GARLIC PO Take 2 tablets by mouth daily.    Yes Historical Provider, MD  fluticasone (FLONASE) 50 MCG/ACT nasal spray Place 2 sprays into both nostrils daily.    Historical Provider, MD    Allergies as of 11/30/2015 - Review Complete 11/30/2015  Allergen Reaction Noted  . Ibuprofen Rash 02/08/2015  . Latex Rash 06/21/2015  . Naprosyn [naproxen] Rash 02/08/2015    Family History  Problem Relation Age of Onset  . Diabetes Mother   . Heart disease Mother   . Stroke Mother   . Stroke Maternal Grandmother     Social History   Social History  . Marital Status: Married    Spouse Name: N/A  . Number of Children: N/A  . Years of Education: N/A   Occupational History  . Not on file.   Social History Main Topics  . Smoking status: Current Every Day Smoker -- 0.50 packs/day for 50 years    Types: Cigarettes  . Smokeless tobacco: Never Used     Comment: has smoked off and on  . Alcohol Use: 0.0 oz/week    0 Standard drinks or equivalent per week     Comment: occasionally - special occasions  . Drug Use: No  . Sexual Activity: Not Currently    Birth Control/ Protection: Post-menopausal   Other Topics Concern  . Not on file   Social History Narrative    Review of Systems: See HPI, otherwise negative ROS  Physical Exam:  BP 128/52 mmHg  Pulse 83  Temp(Src) 97.5 F (36.4 C) (Temporal)  Resp 16  Ht 5\' 8"  (1.727 m)  Wt 163 lb (73.936 kg)  BMI 24.79 kg/m2  SpO2 100%  LMP  (LMP Unknown) General:   Alert,  pleasant and cooperative in NAD Head:  Normocephalic and atraumatic. Neck:  Supple; no masses or thyromegaly. Lungs:  Clear throughout to auscultation.    Heart:  Regular rate and rhythm. Abdomen:  Soft, nontender and nondistended. Normal bowel sounds, without guarding, and without rebound.   Neurologic:  Alert and  oriented x4;  grossly normal neurologically.  Impression/Plan: Sharon [name] is here for an colonoscopy to be performed for abnormal stool test.  Risks, benefits, limitations, and alternatives  regarding  colonoscopy have been reviewed with the patient.  Questions have been answered.  All parties agreeable.   Ollen Bowl, MD  12/13/2015, 8:16 AM

## 2015-12-14 ENCOUNTER — Encounter: Payer: Self-pay | Admitting: Gastroenterology

## 2015-12-17 ENCOUNTER — Encounter: Payer: Self-pay | Admitting: Gastroenterology

## 2015-12-18 ENCOUNTER — Encounter: Payer: Self-pay | Admitting: Gastroenterology

## 2016-03-25 ENCOUNTER — Other Ambulatory Visit: Payer: Self-pay | Admitting: Unknown Physician Specialty

## 2016-03-25 ENCOUNTER — Telehealth: Payer: Self-pay

## 2016-03-25 MED ORDER — BUDESONIDE-FORMOTEROL FUMARATE 160-4.5 MCG/ACT IN AERO: 2.0000 | INHALATION_SPRAY | Freq: Two times a day (BID) | RESPIRATORY_TRACT | Status: DC

## 2016-03-25 NOTE — Telephone Encounter (Signed)
Correct is 2 puffs twice a day.  I will rewrite it

## 2016-03-25 NOTE — Telephone Encounter (Signed)
Pharmacy sent a fax questioning directions on patient's symbicort. The rx we wrote states two puffs daily and pharmacy states that the patient has been doing 2 puffs twice daily. They want to know which it is.

## 2016-06-09 ENCOUNTER — Other Ambulatory Visit: Payer: Self-pay | Admitting: Gastroenterology

## 2016-06-18 DIAGNOSIS — Z23 Encounter for immunization: Secondary | ICD-10-CM | POA: Diagnosis not present

## 2016-06-18 DIAGNOSIS — Z1231 Encounter for screening mammogram for malignant neoplasm of breast: Secondary | ICD-10-CM | POA: Diagnosis not present

## 2016-06-20 ENCOUNTER — Encounter: Payer: Self-pay | Admitting: Family Medicine

## 2016-09-08 ENCOUNTER — Ambulatory Visit (INDEPENDENT_AMBULATORY_CARE_PROVIDER_SITE_OTHER): Payer: Medicare Other | Admitting: Family Medicine

## 2016-09-08 ENCOUNTER — Encounter: Payer: Self-pay | Admitting: Family Medicine

## 2016-09-08 VITALS — BP 113/72 | HR 74 | Temp 98.7°F | Wt 174.0 lb

## 2016-09-08 DIAGNOSIS — S81812A Laceration without foreign body, left lower leg, initial encounter: Secondary | ICD-10-CM

## 2016-09-08 MED ORDER — SULFAMETHOXAZOLE-TRIMETHOPRIM 800-160 MG PO TABS: 1.0000 | ORAL_TABLET | Freq: Two times a day (BID) | ORAL | 0 refills | Status: DC

## 2016-09-08 NOTE — Patient Instructions (Signed)
Follow up in one week

## 2016-09-08 NOTE — Progress Notes (Signed)
   BP 113/72   Pulse 74   Temp 98.7 F (37.1 C)   Wt 174 lb (78.9 kg)   LMP  (LMP Unknown)   SpO2 98%   BMI 26.46 kg/m    Subjective:    Patient ID: Sharon [name], female    DOB: 02/13/1946, 70 y.o.   MRN: [id number]  HPI: Sharon [name] is a 70 y.o. female  Chief Complaint  Patient presents with  . Leg Injury    left leg, happened last night, bumped and tripped into a box and busted a hole in her leg.    Patient presents with left leg laceration x 1 day. States she ran into the sharp corner of the litter box stumbling in the dark after a light bulb went out at home. Immediately flushed the wound with peroxide and dressed it with neosporin and covered it with bandaging. UTD on tetanus shot. No fever, chills, sweats overnight.   Relevant past medical, surgical, family and social history reviewed and updated as indicated. Interim medical history since our last visit reviewed. Allergies and medications reviewed and updated.  Review of Systems  Constitutional: Negative.   HENT: Negative.   Respiratory: Negative.   Cardiovascular: Negative.   Gastrointestinal: Negative.   Genitourinary: Negative.   Musculoskeletal: Negative.   Skin: Positive for wound.  Neurological: Negative.   Psychiatric/Behavioral: Negative.     Per HPI unless specifically indicated above     Objective:    BP 113/72   Pulse 74   Temp 98.7 F (37.1 C)   Wt 174 lb (78.9 kg)   LMP  (LMP Unknown)   SpO2 98%   BMI 26.46 kg/m   Wt Readings from Last 3 Encounters:  09/08/16 174 lb (78.9 kg)  12/13/15 163 lb (73.9 kg)  10/17/15 166 lb 6.4 oz (75.5 kg)    Physical Exam  Constitutional: She is oriented to person, place, and time. She appears well-developed and well-nourished.  HENT:  Head: Atraumatic.  Eyes: Conjunctivae are normal. No scleral icterus.  Neck: Normal range of motion. Neck supple.  Cardiovascular: Normal rate.   Pulmonary/Chest: Effort normal. No respiratory distress.    Musculoskeletal: Normal range of motion.  Neurological: She is alert and oriented to person, place, and time.  Skin: Skin is warm and dry.  Deep wound of lower left extremity. Area is free of debris, mild surrounding erythema without streaking. No active bleeding or drainage.   Psychiatric: She has a normal mood and affect. Her behavior is normal.  Nursing note and vitals reviewed.     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 Continue wound care as before, frequent application of neosporin    Discussed elevating leg as much as possible.   Follow up plan: Return in about 1 week (around 09/15/2016) for Wound check.

## 2016-09-15 ENCOUNTER — Ambulatory Visit (INDEPENDENT_AMBULATORY_CARE_PROVIDER_SITE_OTHER): Payer: Medicare Other | Admitting: Family Medicine

## 2016-09-15 ENCOUNTER — Encounter: Payer: Self-pay | Admitting: Family Medicine

## 2016-09-15 VITALS — BP 128/59 | HR 81 | Temp 98.4°F | Wt 173.0 lb

## 2016-09-15 DIAGNOSIS — S81812D Laceration without foreign body, left lower leg, subsequent encounter: Secondary | ICD-10-CM | POA: Diagnosis not present

## 2016-09-15 MED ORDER — AMOXICILLIN-POT CLAVULANATE 875-125 MG PO TABS: 1.0000 | ORAL_TABLET | Freq: Two times a day (BID) | ORAL | 0 refills | Status: DC

## 2016-09-15 NOTE — Progress Notes (Signed)
BP (!) 128/59   Pulse 81   Temp 98.4 F (36.9 C)   Wt 173 lb (78.5 kg)   LMP  (LMP Unknown)   SpO2 98%   BMI 26.30 kg/m    Subjective:    Patient ID: Sharon [name], female    DOB: [birth year], 70 y.o.   MRN: [id number]  HPI: Sharon [name] is a 70 y.o. female  Chief Complaint  Patient presents with  . Laceration    recheck leg laceration, started having GI upset with the Bactrim and hasn't taken it since yest. morning   Patient presents for 1 week follow up of a left lower leg laceration. Completed 6/7 days on bactrim, but began to have N/V so did not take the last 2 doses. Has been dressing with neosporin and rinsing with peroxide regularly. Pain is some improved, but states there is increasing redness and edema surrounding wound. No fever, chills, body aches, weakness of leg reported.   Relevant past medical, surgical, family and social history reviewed and updated as indicated. Interim medical history since our last visit reviewed. Allergies and medications reviewed and updated.  Review of Systems  Constitutional: Negative.  Negative for fever.  HENT: Negative.   Respiratory: Negative.   Cardiovascular: Negative.   Genitourinary: Negative.   Skin: Positive for wound.  Neurological: Negative.   Psychiatric/Behavioral: Negative.     Per HPI unless specifically indicated above     Objective:    BP (!) 128/59   Pulse 81   Temp 98.4 F (36.9 C)   Wt 173 lb (78.5 kg)   LMP  (LMP Unknown)   SpO2 98%   BMI 26.30 kg/m   Wt Readings from Last 3 Encounters:  09/15/16 173 lb (78.5 kg)  09/08/16 174 lb (78.9 kg)  12/13/15 163 lb (73.9 kg)    Physical Exam  Constitutional: She is oriented to person, place, and time. She appears well-developed and well-nourished.  HENT:  Head: Atraumatic.  Eyes: Conjunctivae are normal. Pupils are equal, round, and reactive to light.  Neck: Normal range of motion. Neck supple.  Cardiovascular: Normal rate and normal heart sounds.     Pulmonary/Chest: Effort normal and breath sounds normal.  Musculoskeletal: Normal range of motion. She exhibits edema (localized edema left lower leg surrounding wound) and tenderness (ttp over wound site).  Neurological: She is alert and oriented to person, place, and time.  Left LE neurovascularly intact  Skin: Skin is warm and dry.  Psychiatric: She has a normal mood and affect. Her behavior is normal.  Nursing note and vitals reviewed.  Procedure: Left LE wound debridement Area was prepped with betadine and numbed using 5 cc of lidocaine with epinephrine. Wound was flushed with saline, then eschar was debrided fully with forceps. Wound was dressed with neosporin and nonstick gauze, then wrapped with ace bandaging. Wound care was discussed at length. Patient tolerated procedure well with no immediate complications noted.       Assessment & Plan:   Problem List Items Addressed This Visit    None    Visit Diagnoses    Leg laceration, left, subsequent encounter    -  Primary   Afebrile, with good pain control and no evidence of local infection. Wound was debrided and dressed today. Wound care discussed. Augmentin sent.    Relevant Medications   amoxicillin-clavulanate (AUGMENTIN) 875-125 MG tablet   Other Relevant Orders   Wound culture       Follow up plan: Return  in about 1 week (around 09/22/2016) for Wound recheck.

## 2016-09-15 NOTE — Patient Instructions (Signed)
Follow up in 1 week for wound recheck

## 2016-09-18 LAB — WOUND CULTURE: Organism ID, Bacteria: NONE SEEN

## 2016-09-22 ENCOUNTER — Ambulatory Visit (INDEPENDENT_AMBULATORY_CARE_PROVIDER_SITE_OTHER): Payer: Medicare Other | Admitting: Family Medicine

## 2016-09-22 ENCOUNTER — Encounter: Payer: Self-pay | Admitting: Family Medicine

## 2016-09-22 VITALS — BP 117/78 | HR 80 | Temp 98.5°F | Wt 175.0 lb

## 2016-09-22 DIAGNOSIS — S81812D Laceration without foreign body, left lower leg, subsequent encounter: Secondary | ICD-10-CM

## 2016-09-22 NOTE — Patient Instructions (Signed)
hibiclens (chlorhexidine) pink antiseptic

## 2016-09-22 NOTE — Progress Notes (Signed)
BP 117/78   Pulse 80   Temp 98.5 F (36.9 C)   Wt 175 lb (79.4 kg)   LMP  (LMP Unknown)   SpO2 96%   BMI 26.61 kg/m    Subjective:    Patient ID: Sharon [name], female    DOB: 25-Sep-1946, 70 y.o.   MRN: [id number]  HPI: Sharon [name] is a 70 y.o. female  Chief Complaint  Patient presents with  . Recheck    1 week recheck on leg laceration. The antibiotic hurts her stomach so she's been taking  1/2 tablet 4 times a day.  . Leg Injury    Friday night she hit her other leg with the vaccumn canister.    Patient presents for wound recheck of left lower leg laceration. Has been having some GI upset with the amoxicillin as well, so been taking half tablet 4 times daily. Has been eating lots of yogurt to try and help. No fever, chills, sweats, or thick drainage from wound. Has been diligent with wound care, dressing with neosporin and bandages as recommended. Does note that she ran into her vacuum cleaner with lateral right lower extremity over the weekend and has a linear abrasion now on that side that she has been dressing as well. Pain is under modest control, but she will not take anything stronger than tylenol and states she would rather tough it out.   Relevant past medical, surgical, family and social history reviewed and updated as indicated. Interim medical history since our last visit reviewed. Allergies and medications reviewed and updated.  Review of Systems  Constitutional: Negative.   HENT: Negative.   Eyes: Negative.   Respiratory: Negative.   Cardiovascular: Negative.   Gastrointestinal: Negative.   Endocrine: Negative.   Genitourinary: Negative.   Skin: Positive for wound.  Neurological: Negative.   Psychiatric/Behavioral: Negative.     Per HPI unless specifically indicated above     Objective:    BP 117/78   Pulse 80   Temp 98.5 F (36.9 C)   Wt 175 lb (79.4 kg)   LMP  (LMP Unknown)   SpO2 96%   BMI 26.61 kg/m   Wt Readings from Last 3 Encounters:    09/22/16 175 lb (79.4 kg)  09/15/16 173 lb (78.5 kg)  09/08/16 174 lb (78.9 kg)    Physical Exam  Constitutional: She is oriented to person, place, and time. She appears well-developed and well-nourished. No distress.  HENT:  Head: Atraumatic.  Eyes: Conjunctivae are normal. Pupils are equal, round, and reactive to light. No scleral icterus.  Neck: Normal range of motion. Neck supple.  Cardiovascular: Normal rate and normal heart sounds.   Pulmonary/Chest: Effort normal. No respiratory distress.  Musculoskeletal: Normal range of motion.  Neurological: She is alert and oriented to person, place, and time.  Skin: Skin is warm and dry.  Left lower leg wound healing well, no purulent drainage, eschar, or streaking present. Wound is filling in nicely.  Right lower leg wound without evidence of infection or drainage.   Psychiatric: She has a normal mood and affect. Her behavior is normal.  Nursing note and vitals reviewed.     Assessment & Plan:   Problem List Items Addressed This Visit    None    Visit Diagnoses    Leg laceration, left, subsequent encounter    -  Primary   Wound healing well, continue wound care as discussed. Monitor closely for worsening sxs, fever, chills, etc.Continue good wound  care on right leg wound        Follow up plan: Return in about 2 weeks (around 10/06/2016). unless worsening or concerned/wanting to have it checked sooner

## 2016-09-29 ENCOUNTER — Encounter: Payer: Self-pay | Admitting: Family Medicine

## 2016-09-29 ENCOUNTER — Ambulatory Visit (INDEPENDENT_AMBULATORY_CARE_PROVIDER_SITE_OTHER): Payer: Medicare Other | Admitting: Family Medicine

## 2016-09-29 VITALS — BP 125/70 | HR 75 | Temp 98.0°F | Wt 176.0 lb

## 2016-09-29 DIAGNOSIS — L03116 Cellulitis of left lower limb: Secondary | ICD-10-CM

## 2016-09-29 MED ORDER — ONDANSETRON 4 MG PO TBDP: 4.0000 mg | ORAL_TABLET | Freq: Three times a day (TID) | ORAL | 0 refills | Status: DC | PRN

## 2016-09-29 MED ORDER — PENICILLIN V POTASSIUM 250 MG PO TABS: 250.0000 mg | ORAL_TABLET | Freq: Four times a day (QID) | ORAL | 0 refills | Status: DC

## 2016-09-29 NOTE — Progress Notes (Signed)
BP 125/70 (BP Location: Left Arm, Patient Position: Sitting, Cuff Size: Normal)   Pulse 75   Temp 98 F (36.7 C)   Wt 176 lb (79.8 kg)   LMP  (LMP Unknown)   SpO2 98%   BMI 26.76 kg/m    Subjective:    Patient ID: Sharon [name], female    DOB: July 04, 1946, 70 y.o.   MRN: [id number]  HPI: Sharon [name] is a 70 y.o. female  Chief Complaint  Patient presents with  . Wound Check   Patient presents for 1 week wound follow up. Having increasing pain, redness, swelling over the past week especially with left lower leg wound. Previously, pain was decently controlled with tylenol but now patient is having difficulty getting pain under control. Does not want to take stronger medications at this time. Denies known fever, but states she felt like she may have been feverish over weekend. No fever today. Has still been performing wound care and keeping wounds wrapped. She is very concerned about possible infection and is specifically requesting plain PCN as she feels she does best on this and is very sensitive to most medications. Has previously completed nearly a week on bactrim, and a week on augmentin with 1-2 weeks rest since being on an antibiotic at this time.   Relevant past medical, surgical, family and social history reviewed and updated as indicated. Interim medical history since our last visit reviewed. Allergies and medications reviewed and updated.  Review of Systems  Constitutional: Positive for fever.  HENT: Negative.   Eyes: Negative.   Respiratory: Negative.   Cardiovascular: Negative.   Gastrointestinal: Negative.   Genitourinary: Negative.   Musculoskeletal:       B/l lower leg pain associated with wounds  Skin: Positive for wound.  Neurological: Negative.   Psychiatric/Behavioral: Negative.     Per HPI unless specifically indicated above     Objective:    BP 125/70 (BP Location: Left Arm, Patient Position: Sitting, Cuff Size: Normal)   Pulse 75   Temp 98 F  (36.7 C)   Wt 176 lb (79.8 kg)   LMP  (LMP Unknown)   SpO2 98%   BMI 26.76 kg/m   Wt Readings from Last 3 Encounters:  09/29/16 176 lb (79.8 kg)  09/22/16 175 lb (79.4 kg)  09/15/16 173 lb (78.5 kg)    Physical Exam  Constitutional: She is oriented to person, place, and time. She appears well-developed and well-nourished.  HENT:  Head: Atraumatic.  Eyes: Conjunctivae are normal. No scleral icterus.  Neck: Normal range of motion. Neck supple.  Cardiovascular: Normal rate and normal heart sounds.   Pulmonary/Chest: Effort normal. No respiratory distress.  Musculoskeletal: Normal range of motion.  Neurological: She is oriented to person, place, and time.  Skin: Skin is warm and dry. There is erythema (left lower leg erythematous and edematous surrounding wound).  Left lower leg wound without abscess, granulation tissue present and filling in nicely but increasing warmth, redness, and edema from last week  Right lower leg wound appears to be healing well, minimal surrounding erythema or edema and no purulent drainage present  Psychiatric: She has a normal mood and affect.  Nursing note and vitals reviewed.     Assessment & Plan:   Problem List Items Addressed This Visit    None    Visit Diagnoses    Left leg cellulitis    -  Primary   Some increasing redness and edema from last week. Will start  penicillin as pt states this is the one she tolerates best. Referral to wound care placed.    Relevant Medications   penicillin v potassium (VEETID) 250 MG tablet   Other Relevant Orders   AMB referral to wound care center    Pt will let us know if she feels she needs stronger pain medicine before seeing wound care. Red flag symptoms discussed and patient aware to go to the ER for these or other worsening symptoms.    Follow up plan: Return in about 1 week (around 10/06/2016) for wound follow up unless able to get into wound care before then.

## 2016-09-29 NOTE — Patient Instructions (Signed)
Follow up as needed

## 2016-10-02 ENCOUNTER — Encounter: Payer: Medicare Other | Attending: Internal Medicine | Admitting: Nurse Practitioner

## 2016-10-02 DIAGNOSIS — M810 Age-related osteoporosis without current pathological fracture: Secondary | ICD-10-CM | POA: Diagnosis not present

## 2016-10-02 DIAGNOSIS — J449 Chronic obstructive pulmonary disease, unspecified: Secondary | ICD-10-CM | POA: Insufficient documentation

## 2016-10-02 DIAGNOSIS — Z886 Allergy status to analgesic agent status: Secondary | ICD-10-CM | POA: Diagnosis not present

## 2016-10-02 DIAGNOSIS — Z87891 Personal history of nicotine dependence: Secondary | ICD-10-CM | POA: Diagnosis not present

## 2016-10-02 DIAGNOSIS — M1990 Unspecified osteoarthritis, unspecified site: Secondary | ICD-10-CM | POA: Insufficient documentation

## 2016-10-02 DIAGNOSIS — L97822 Non-pressure chronic ulcer of other part of left lower leg with fat layer exposed: Secondary | ICD-10-CM | POA: Diagnosis not present

## 2016-10-02 DIAGNOSIS — L97811 Non-pressure chronic ulcer of other part of right lower leg limited to breakdown of skin: Secondary | ICD-10-CM | POA: Diagnosis not present

## 2016-10-02 DIAGNOSIS — S81811A Laceration without foreign body, right lower leg, initial encounter: Secondary | ICD-10-CM | POA: Insufficient documentation

## 2016-10-02 DIAGNOSIS — R32 Unspecified urinary incontinence: Secondary | ICD-10-CM | POA: Insufficient documentation

## 2016-10-02 DIAGNOSIS — F419 Anxiety disorder, unspecified: Secondary | ICD-10-CM | POA: Insufficient documentation

## 2016-10-02 DIAGNOSIS — X58XXXA Exposure to other specified factors, initial encounter: Secondary | ICD-10-CM | POA: Diagnosis not present

## 2016-10-03 NOTE — Progress Notes (Addendum)
Baldyga, Latisha H. ([id number]) Visit Report for 10/02/2016 Allergy List Details Patient Name: Sharon [name], Sharon H. Date of Service: 10/02/2016 12:30 PM Medical Record Number: [id number] Patient Account Number: [id number] Date of Birth/Sex: 11/04/1945 (70 y.o. Female) Treating RN: Woody, Kim Primary Care Physician: WICKER, [name] Other Clinician: Referring Physician: LANE, RACHEL Treating Physician/Extender: Coulter, Leah Weeks in Treatment: 0 Allergies Active Allergies ibuprofen latex nut - unspecified Allergy Notes Electronic Signature(s) Signed: 10/02/2016 6:03:08 PM By: Woody, RN, BSN, Kim RN, BSN Entered By: Woody, RN, BSN, Kim on 10/02/2016 13:10:19 Sharon [name], Sharon H. ([id number]) -------------------------------------------------------------------------------- Arrival Information Details Patient Name: Sharon [name], Sharon H. Date of Service: 10/02/2016 12:30 PM Medical Record Patient Account Number: [id number] Number: Treating RN: Woody, Kim 06/20/1946 (70 y.o. Other Clinician: Date of Birth/Sex: Female) Treating ROBSON, MICHAEL Primary Care Physician: WICKER, [name] Physician/Extender: G Referring Physician: LANE, RACHEL Weeks in Treatment: 0 Visit Information Patient Arrived: Ambulatory Arrival Time: 12:54 Accompanied By: self Transfer Assistance: None Patient Identification Verified: Yes Secondary Verification Process Yes Completed: Patient Requires Transmission-Based No Precautions: Patient Has Alerts: No Electronic Signature(s) Signed: 10/02/2016 6:03:08 PM By: Woody, RN, BSN, Kim RN, BSN Entered By: Woody, RN, BSN, Kim on 10/02/2016 12:55:40 Sharon [name], Sharon H. ([id number]) -------------------------------------------------------------------------------- Clinic Level of Care Assessment Details Patient Name: Sharon [name], Sharon H. Date of Service: 10/02/2016 12:30 PM Medical Record Number: [id number] Patient Account Number: [id number] Date of Birth/Sex: 03/15/1946 (70 y.o.  Female) Treating RN: Woody, Kim Primary Care Physician: WICKER, [name] Other Clinician: Referring Physician: LANE, RACHEL Treating Physician/Extender: Coulter, Leah Weeks in Treatment: 0 Clinic Level of Care Assessment Items TOOL 1 Quantity Score [] - Use when EandM and Procedure is performed on INITIAL visit 0 ASSESSMENTS - Nursing Assessment / Reassessment X - General Physical Exam (combine w/ comprehensive assessment (listed just 1 20 below) when performed on new pt. evals) X - Comprehensive Assessment (HX, ROS, Risk Assessments, Wounds Hx, etc.) 1 25 ASSESSMENTS - Wound and Skin Assessment / Reassessment [] - Dermatologic / Skin Assessment (not related to wound area) 0 ASSESSMENTS - Ostomy and/or Continence Assessment and Care [] - Incontinence Assessment and Management 0 [] - Ostomy Care Assessment and Management (repouching, etc.) 0 PROCESS - Coordination of Care X - Simple Patient / Family Education for ongoing care 1 15 [] - Complex (extensive) Patient / Family Education for ongoing care 0 X - Staff obtains Consents, Records, Test Results / Process Orders 1 10 [] - Staff telephones HHA, Nursing Homes / Clarify orders / etc 0 [] - Routine Transfer to another Facility (non-emergent condition) 0 [] - Routine Hospital Admission (non-emergent condition) 0 X - New Admissions / Insurance Authorizations / Ordering NPWT, Apligraf, etc. 1 15 [] - Emergency Hospital Admission (emergent condition) 0 PROCESS - Special Needs [] - Pediatric / Minor Patient Management 0 [] - Isolation Patient Management 0 Sharon [name], Sharon H. ([id number]) [] - Hearing / Language / Visual special needs 0 [] - Assessment of Community assistance (transportation, D/C planning, etc.) 0 [] - Additional assistance / Altered mentation 0 [] - Support Surface(s) Assessment (bed, cushion, seat, etc.) 0 INTERVENTIONS - Miscellaneous [] - External ear exam 0 [] - Patient Transfer (multiple staff / Hoyer Lift / Similar  devices) 0 [] - Simple Staple / Suture removal (25 or less) 0 [] - Complex Staple / Suture removal (26 or more) 0 [] - Hypo/Hyperglycemic Management (do not check if billed separately) 0 X - Ankle / Brachial Index (ABI) - do not check if billed separately 1 15   Has the patient been seen at the hospital within the last three years: Yes Total Score: 100 Level Of Care: New/Established - Level 3 Electronic Signature(s) Signed: 10/02/2016 6:03:08 PM By: Gretta Cool, RN, BSN, Kim RN, BSN Entered By: Gretta Cool, RN, BSN, Kim on 10/02/2016 14:36:26 Sharon [name], Sharon [name] ([id number]) -------------------------------------------------------------------------------- Encounter Discharge Information Details Patient Name: Sharon [name] Date of Service: 10/02/2016 12:30 PM Medical Record Number: [id number] Patient Account Number: [id number] Date of Birth/Sex: 1945/10/22 (70 y.o. Female) Treating RN: Cornell Barman Primary Care Physician: Kathrine Haddock Other Clinician: Referring Physician: Merrie Roof Treating Physician/Extender: Cathie Olden in Treatment: 0 Encounter Discharge Information Items Schedule Follow-up Appointment: No Medication Reconciliation completed and provided to Patient/Care No Loni Abdon: Provided on Clinical Summary of Care: 10/02/2016 Form Type Recipient Paper Patient MM Electronic Signature(s) Signed: 10/02/2016 2:28:01 PM By: Ruthine Dose Entered By: Ruthine Dose on 10/02/2016 14:28:01 Sharon [name] ([id number]) -------------------------------------------------------------------------------- Lower Extremity Assessment Details Patient Name: Sharon [name] Date of Service: 10/02/2016 12:30 PM Medical Record Number: [id number] Patient Account Number: [id number] Date of Birth/Sex: [birth year] (70 y.o. Female) Treating RN: Cornell Barman Primary Care Physician: Kathrine Haddock Other Clinician: Referring Physician: Merrie Roof Treating Physician/Extender: Cathie Olden in  Treatment: 0 Edema Assessment Assessed: [Left: Yes] [Right: Yes] Edema: [Left: No] [Right: No] Vascular Assessment Claudication: Claudication Assessment [Left:None] [Right:None] Pulses: Dorsalis Pedis Palpable: [Left:Yes] [Right:Yes] Doppler Audible: [Left:Inaudible] [Right:Yes] Posterior Tibial Palpable: [Left:Yes] [Right:Yes] Doppler Audible: [Left:Yes] [Right:Yes] Extremity colors, hair growth, and conditions: Extremity Color: [Left:Normal] [Right:Normal] Hair Growth on Extremity: [Left:Yes] [Right:Yes] Temperature of Extremity: [Left:Warm] [Right:Warm] Capillary Refill: [Left:< 3 seconds] [Right:< 3 seconds] Dependent Rubor: [Left:No] [Right:No] Blanched when Elevated: [Left:No] [Right:No] Lipodermatosclerosis: [Left:No] [Right:No] Blood Pressure: Brachial: [Left:142] [Right:138] Dorsalis Pedis: [Left:Dorsalis Pedis: 148] Ankle: Posterior Tibial: 110 [Left:Posterior Tibial: 138 0.77] [Right:1.04] Toe Nail Assessment Left: Right: Thick: No No Discolored: No No Deformed: No No Improper Length and Hygiene: No No Electronic Signature(s) Sharon [name], Sharon [name] ([id number]) Signed: 10/02/2016 6:03:08 PM By: Gretta Cool, RN, BSN, Kim RN, BSN Entered By: Gretta Cool, RN, BSN, Kim on 10/02/2016 13:30:51 Sharon [name], Sharon [name] ([id number]) -------------------------------------------------------------------------------- Multi Wound Chart Details Patient Name: Sharon [name] Date of Service: 10/02/2016 12:30 PM Medical Record Number: [id number] Patient Account Number: [id number] Date of Birth/Sex: 08-20-1946 (70 y.o. Female) Treating RN: Cornell Barman Primary Care Physician: Kathrine Haddock Other Clinician: Referring Physician: Merrie Roof Treating Physician/Extender: Cathie Olden in Treatment: 0 Vital Signs Height(in): 68 Pulse(bpm): 68 Weight(lbs): 174.5 Blood Pressure 130/58 (mmHg): Body Mass Index(BMI): 27 Temperature(F): 98.4 Respiratory Rate 16 (breaths/min): Photos: [1:No  Photos] [2:No Photos] [N/A:N/A] Wound Location: [1:Left Lower Leg] [2:Right Lower Leg] [N/A:N/A] Wounding Event: [1:Trauma] [2:Trauma] [N/A:N/A] Primary Etiology: [1:Trauma, Other] [2:Trauma, Other] [N/A:N/A] Comorbid History: [1:Asthma, Chronic Obstructive Pulmonary Disease (COPD), Osteoarthritis] [2:Asthma, Chronic Obstructive Pulmonary Disease (COPD), Osteoarthritis] [N/A:N/A] Date Acquired: [1:09/06/2016] [2:09/13/2016] [N/A:N/A] Weeks of Treatment: [1:0] [2:0] [N/A:N/A] Wound Status: [1:Open] [2:Open] [N/A:N/A] Measurements L x W x D 2.8x1x0.1 [2:0.9x0.5x0.1] [N/A:N/A] (cm) Area (cm) : [1:2.199] [2:0.353] [N/A:N/A] Volume (cm) : [1:0.22] [2:0.035] [N/A:N/A] Classification: [1:Full Thickness Without Exposed Support Structures] [2:Full Thickness Without Exposed Support Structures] [N/A:N/A] Exudate Amount: [1:Medium] [2:None Present] [N/A:N/A] Exudate Type: [1:Serous] [2:N/A] [N/A:N/A] Exudate Color: [1:amber] [2:N/A] [N/A:N/A] Wound Margin: [1:Flat and Intact] [2:Flat and Intact] [N/A:N/A] Granulation Amount: [1:Small (1-33%)] [2:None Present (0%)] [N/A:N/A] Granulation Quality: [1:Pink] [2:N/A] [N/A:N/A] Necrotic Amount: [1:Large (67-100%)] [2:Large (67-100%)] [N/A:N/A] Necrotic Tissue: [1:Eschar, Adherent Slough] [2:Eschar] [N/A:N/A] Exposed Structures: [1:Fat: Yes Fascia: No Tendon: No Muscle: No] [2:Fascia: No Fat: No Tendon: No Muscle: No] [  N/A:N/A] Joint: No Joint: No Bone: No Bone: No Limited to Skin Breakdown Epithelialization: Small (1-33%) None N/A Periwound Skin Texture: Edema: No Edema: No N/A Excoriation: No Excoriation: No Induration: No Induration: No Callus: No Callus: No Crepitus: No Crepitus: No Fluctuance: No Fluctuance: No Friable: No Friable: No Rash: No Rash: No Scarring: No Scarring: No Periwound Skin Maceration: No Dry/Scaly: Yes N/A Moisture: Moist: No Maceration: No Dry/Scaly: No Moist: No Periwound Skin Color: Ecchymosis: Yes  Atrophie Blanche: No N/A Atrophie Blanche: No Cyanosis: No Cyanosis: No Ecchymosis: No Erythema: No Erythema: No Hemosiderin Staining: No Hemosiderin Staining: No Mottled: No Mottled: No Pallor: No Pallor: No Rubor: No Rubor: No Temperature: No Abnormality N/A N/A Tenderness on Yes No N/A Palpation: Wound Preparation: Ulcer Cleansing: Ulcer Cleansing: N/A Rinsed/Irrigated with Rinsed/Irrigated with Saline Saline Topical Anesthetic Topical Anesthetic Applied: Other: liociane Applied: Other: lidocaine 4% 4% Treatment Notes Electronic Signature(s) Signed: 10/02/2016 6:03:08 PM By: Gretta Cool, RN, BSN, Kim RN, BSN Entered By: Gretta Cool, RN, BSN, Kim on 10/02/2016 14:00:12 Sharon [name], Sharon [name] ([id number]) -------------------------------------------------------------------------------- Multi-Disciplinary Care Plan Details Patient Name: Sharon [name] Date of Service: 10/02/2016 12:30 PM Medical Record Number: [id number] Patient Account Number: [id number] Date of Birth/Sex: [birth year] (70 y.o. Female) Treating RN: Cornell Barman Primary Care Physician: Kathrine Haddock Other Clinician: Referring Physician: Merrie Roof Treating Physician/Extender: Cathie Olden in Treatment: 0 Active Inactive Orientation to the Wound Care Program Nursing Diagnoses: Knowledge deficit related to the wound healing center program Goals: Patient/caregiver will verbalize understanding of the [place] Program Date Initiated: 10/02/2016 Goal Status: Active Interventions: Provide education on orientation to the wound center Notes: Pain, Acute or Chronic Nursing Diagnoses: Pain Management - Non-cyclic Acute (Procedural) Goals: Patient/caregiver will verbalize comfort level met Date Initiated: 10/02/2016 Goal Status: Active Interventions: Encourage patient to take pain medications as prescribed Implement pain control techniques (non-pharmaceutical) Reposition patient for comfort Treatment  Activities: Administer pain control measures as ordered : 10/02/2016 Notes: Wound/Skin Impairment Nursing Diagnoses: Sharon [name], Sharon [name] ([id number]) Impaired tissue integrity Goals: Patient/caregiver will verbalize understanding of skin care regimen Date Initiated: 10/02/2016 Goal Status: Active Interventions: Assess patient/caregiver ability to obtain necessary supplies Notes: Electronic Signature(s) Signed: 10/02/2016 6:03:08 PM By: Gretta Cool, RN, BSN, Kim RN, BSN Entered By: Gretta Cool, RN, BSN, Kim on 10/02/2016 13:59:29 Sharon [name], Sharon [name] ([id number]) -------------------------------------------------------------------------------- Pain Assessment Details Patient Name: Sharon [name] Date of Service: 10/02/2016 12:30 PM Medical Record Patient Account Number: [id number] Number: Treating RN: Cornell Barman 05-14-1946 (70 y.o. Other Clinician: Date of Birth/Sex: Female) Treating ROBSON, MICHAEL Primary Care Physician: Kathrine Haddock Physician/Extender: G Referring Physician: Merrie Roof Weeks in Treatment: 0 Active Problems Location of Pain Severity and Description of Pain Patient Has Paino No Site Locations With Dressing Change: No Pain Management and Medication Current Pain Management: Electronic Signature(s) Signed: 10/02/2016 6:03:08 PM By: Gretta Cool, RN, BSN, Kim RN, BSN Entered By: Gretta Cool, RN, BSN, Kim on 10/02/2016 12:56:01 JULIETTA, BATTERMAN ([id number]) -------------------------------------------------------------------------------- Wound Assessment Details Patient Name: Sharon [name] Date of Service: 10/02/2016 12:30 PM Medical Record Number: [id number] Patient Account Number: [id number] Date of Birth/Sex: March 23, 1946 (70 y.o. Female) Treating RN: Cornell Barman Primary Care Physician: Kathrine Haddock Other Clinician: Referring Physician: Merrie Roof Treating Physician/Extender: Lawanda Cousins Weeks in Treatment: 0 Wound Status Wound Number: 1 Primary Trauma,  Other Etiology: Wound Location: Left Lower Leg Wound Open Wounding Event: Trauma Status: Date Acquired: 09/06/2016 Comorbid Asthma, Chronic Obstructive Weeks Of Treatment: 0 History: Pulmonary Disease (COPD), Clustered Wound: No Osteoarthritis Photos Wound Measurements Length: (cm)  2.8 Width: (cm) 1 Depth: (cm) 0.1 Area: (cm²) 2.199 Volume: (cm³) 0.22 % Reduction in Area: 0% % Reduction in Volume: 0% Epithelialization: Small (1-33%) Tunneling: No Undermining: No Wound Description Full Thickness Without Exposed Classification: Support Structures Wound Margin: Flat and Intact Exudate None Present Amount: Foul Odor After Cleansing: No Wound Bed Granulation Amount: Small (1-33%) Exposed Structure Granulation Quality: Pink Fascia Exposed: No Necrotic Amount: Large (67-100%) Fat Layer Exposed: Yes Necrotic Quality: Eschar, Adherent Slough Tendon Exposed: No Muscle Exposed: No Joint Exposed: No Bone Exposed: No Mccarn, Latoyia H. ([id number]) Periwound Skin Texture Texture Color No Abnormalities Noted: No No Abnormalities Noted: No Callus: No Atrophie Blanche: No Crepitus: No Cyanosis: No Excoriation: No Ecchymosis: Yes Fluctuance: No Erythema: No Friable: No Hemosiderin Staining: No Induration: No Mottled: No Localized Edema: No Pallor: No Rash: No Rubor: No Scarring: No Temperature / Pain Moisture Temperature: No Abnormality No Abnormalities Noted: No Tenderness on Palpation: Yes Dry / Scaly: No Maceration: No Moist: No Wound Preparation Ulcer Cleansing: Rinsed/Irrigated with Saline Topical Anesthetic Applied: Other: liociane 4%, Electronic Signature(s) Signed: 10/02/2016 6:03:08 PM By: Woody, RN, BSN, Kim RN, BSN Entered By: Woody, RN, BSN, Kim on 10/02/2016 17:02:19 Hu, Aerika H. ([id number]) -------------------------------------------------------------------------------- Wound Assessment Details Patient Name: Ellena, Amara H. Date of  Service: 10/02/2016 12:30 PM Medical Record Number: [id number] Patient Account Number: [id number] Date of Birth/Sex: 10/25/1945 (70 y.o. Female) Treating RN: Woody, Kim Primary Care Physician: WICKER, [name] Other Clinician: Referring Physician: LANE, RACHEL Treating Physician/Extender: Coulter, Leah Weeks in Treatment: 0 Wound Status Wound Number: 2 Primary Trauma, Other Etiology: Wound Location: Right Lower Leg Wound Open Wounding Event: Trauma Status: Date Acquired: 09/13/2016 Comorbid Asthma, Chronic Obstructive Weeks Of Treatment: 0 History: Pulmonary Disease (COPD), Clustered Wound: No Osteoarthritis Photos Photo Uploaded By: Woody, RN, BSN, Kim on 10/02/2016 16:57:57 Wound Measurements Length: (cm) 0.9 Width: (cm) 0.5 Depth: (cm) 0.1 Area: (cm²) 0.353 Volume: (cm³) 0.035 % Reduction in Area: % Reduction in Volume: Epithelialization: None Tunneling: No Undermining: No Wound Description Full Thickness Without Exposed Classification: Support Structures Wound Margin: Flat and Intact Exudate None Present Amount: Foul Odor After Cleansing: No Wound Bed Granulation Amount: None Present (0%) Exposed Structure Necrotic Amount: Large (67-100%) Fascia Exposed: No Necrotic Quality: Eschar Fat Layer Exposed: No Tendon Exposed: No Muscle Exposed: No Joint Exposed: No Branch, Stellar H. ([id number]) Bone Exposed: No Limited to Skin Breakdown Periwound Skin Texture Texture Color No Abnormalities Noted: No No Abnormalities Noted: No Callus: No Atrophie Blanche: No Crepitus: No Cyanosis: No Excoriation: No Ecchymosis: No Fluctuance: No Erythema: No Friable: No Hemosiderin Staining: No Induration: No Mottled: No Localized Edema: No Pallor: No Rash: No Rubor: No Scarring: No Moisture No Abnormalities Noted: No Dry / Scaly: Yes Maceration: No Moist: No Wound Preparation Ulcer Cleansing: Rinsed/Irrigated with Saline Topical Anesthetic Applied: Other:  lidocaine 4%, Electronic Signature(s) Signed: 10/02/2016 6:03:08 PM By: Woody, RN, BSN, Kim RN, BSN Entered By: Woody, RN, BSN, Kim on 10/02/2016 13:36:26 Califf, Ayven H. ([id number]) -------------------------------------------------------------------------------- Vitals Details Patient Name: Tijerino, Zully H. Date of Service: 10/02/2016 12:30 PM Medical Record Patient Account Number: [id number] Number: Treating RN: Woody, Kim 06/03/1946 (70 y.o. Other Clinician: Date of Birth/Sex: Female) Treating ROBSON, MICHAEL Primary Care Physician: WICKER, [name] Physician/Extender: G Referring Physician: LANE, RACHEL Weeks in Treatment: 0 Vital Signs Time Taken: 12:57 Temperature (°F): 98.4 Height (in): 68 Pulse (bpm): 68 Source: Stated Respiratory Rate (breaths/min): 16 Weight (lbs): 174.5 Blood Pressure (mmHg): 130/58 Source: Measured Reference Range: 80 - 120 mg /   dl Body Mass Index (BMI): 26.5 Electronic Signature(s) Signed: 10/02/2016 6:03:08 PM By: Woody, RN, BSN, Kim RN, BSN Entered By: Woody, RN, BSN, Kim on 10/02/2016 12:58:21 

## 2016-10-03 NOTE — Progress Notes (Signed)
Sharon [name], Sharon [name] ([id number]) Visit Report for 10/02/2016 Abuse/Suicide Risk Screen Details Patient Name: Sharon [name], Sharon [name] 10/02/2016 12:30 Date of Service: PM Medical Record [id number] Number: Patient Account Number: [id number] June 08, 1946 (70 y.o. Treating RN: Cornell Barman Date of Birth/Sex: Female) Other Clinician: Primary Care Physician: Kathrine Haddock Treating Coulter, Leah Referring Physician: Merrie Roof Physician/Extender: Suella Grove in Treatment: 0 Abuse/Suicide Risk Screen Items Answer ABUSE/SUICIDE RISK SCREEN: Has anyone close to you tried to hurt or harm you recentlyo No Do you feel uncomfortable with anyone in your familyo No Has anyone forced you do things that you didnot want to doo No Do you have any thoughts of harming yourselfo No Patient displays signs or symptoms of abuse and/or neglect. No Electronic Signature(s) Signed: 10/02/2016 6:03:08 PM By: Gretta Cool, RN, BSN, Kim RN, BSN Entered By: Gretta Cool, RN, BSN, Kim on 10/02/2016 13:07:46 Sharon [name], Sharon [name] ([id number]) -------------------------------------------------------------------------------- Activities of Daily Living Details Patient Name: Sharon [name], Sharon [name] 10/02/2016 12:30 Date of Service: PM Medical Record [id number] Number: Patient Account Number: [id number] [birth year] (70 y.o. Treating RN: Cornell Barman Date of Birth/Sex: Female) Other Clinician: Primary Care Physician: Kathrine Haddock Treating Coulter, Leah Referring Physician: Merrie Roof Physician/Extender: Suella Grove in Treatment: 0 Activities of Daily Living Items Answer Activities of Daily Living (Please select one for each item) Drive Automobile Completely Able Take Medications Completely Able Use Telephone Completely Able Care for Appearance Completely Able Use Toilet Completely Able Bath / Shower Completely Able Dress Self Completely Able Feed Self Completely Able Walk Completely Able Get In / Out Bed Completely Able Housework Completely Able Prepare  Meals Completely [place] for Self Completely Able Electronic Signature(s) Signed: 10/02/2016 6:03:08 PM By: Gretta Cool, RN, BSN, Kim RN, BSN Entered By: Gretta Cool, RN, BSN, Kim on 10/02/2016 13:07:57 Sharon [name], Sharon [name] ([id number]) -------------------------------------------------------------------------------- Education Assessment Details Patient Name: Sharon [name], Sharon [name] 10/02/2016 12:30 Date of Service: PM Medical Record [id number] Number: Patient Account Number: [id number] 27-Nov-1945 (70 y.o. Treating RN: Cornell Barman Date of Birth/Sex: Female) Other Clinician: Primary Care Physician: Kathrine Haddock Treating Coulter, Leah Referring Physician: Merrie Roof Physician/Extender: Suella Grove in Treatment: 0 Primary Learner Assessed: Patient Learning Preferences/Education Level/Primary Language Learning Preference: Explanation, Demonstration Highest Education Level: High School Preferred Language: English Cognitive Barrier Assessment/Beliefs Language Barrier: No Translator Needed: No Memory Deficit: No Emotional Barrier: No Cultural/Religious Beliefs Affecting Medical No Care: Physical Barrier Assessment Impaired Vision: Yes Glasses Impaired Hearing: No Decreased Hand dexterity: No Knowledge/Comprehension Assessment Knowledge Level: High Comprehension Level: High Ability to understand written High instructions: Ability to understand verbal High instructions: Motivation Assessment Anxiety Level: Calm Cooperation: Cooperative Education Importance: Acknowledges Need Interest in Health Problems: Asks Questions Perception: Coherent Willingness to Engage in Self- High Management Activities: Readiness to Engage in Self- High Management Activities: Sharon [name], Sharon [name] ([id number]) Electronic Signature(s) Signed: 10/02/2016 6:03:08 PM By: Gretta Cool, RN, BSN, Kim RN, BSN Entered By: Gretta Cool, RN, BSN, Kim on 10/02/2016 13:08:31 Sharon [name], Sharon [name]  ([id number]) -------------------------------------------------------------------------------- Fall Risk Assessment Details Patient Name: Sharon [name], Sharon [name] 10/02/2016 12:30 Date of Service: PM Medical Record [id number] Number: Patient Account Number: [id number] 01/02/1946 (70 y.o. Treating RN: Cornell Barman Date of Birth/Sex: Female) Other Clinician: Primary Care Physician: Kathrine Haddock Treating Coulter, Leah Referring Physician: Merrie Roof Physician/Extender: Suella Grove in Treatment: 0 Fall Risk Assessment Items Have you had 2 or more falls in the last 12 monthso 0 No Have you had any fall that resulted in injury in the last 12 monthso 0 No FALL RISK ASSESSMENT: History of falling - immediate or within  3 months 0 No Secondary diagnosis 0 No Ambulatory aid None/bed rest/wheelchair/nurse 0 Yes Crutches/cane/walker 0 No Furniture 0 No IV Access/Saline Lock 0 No Gait/Training Normal/bed rest/immobile 0 Yes Weak 0 No Impaired 0 No Mental Status Oriented to own ability 0 Yes Electronic Signature(s) Signed: 10/02/2016 6:03:08 PM By: Gretta Cool, RN, BSN, Kim RN, BSN Entered By: Gretta Cool, RN, BSN, Kim on 10/02/2016 13:08:49 Sharon [name], Sharon [name] ([id number]) -------------------------------------------------------------------------------- Nutrition Risk Assessment Details Patient Name: Sharon [name], Sharon [name] 10/02/2016 12:30 Date of Service: PM Medical Record [id number] Number: Patient Account Number: [id number] May 15, 1946 (70 y.o. Treating RN: Cornell Barman Date of Birth/Sex: Female) Other Clinician: Primary Care Physician: Kathrine Haddock Treating Coulter, Leah Referring Physician: Merrie Roof Physician/Extender: Suella Grove in Treatment: 0 Height (in): 68 Weight (lbs): 174.5 Body Mass Index (BMI): 26.5 Nutrition Risk Assessment Items NUTRITION RISK SCREEN: I have an illness or condition that made me change the kind and/or 0 No amount of food I eat I eat fewer than two meals per day 0 No I eat few  fruits and vegetables, or milk products 0 No I have three or more drinks of beer, liquor or wine almost every day 0 No I have tooth or mouth problems that make it hard for me to eat 0 No I don't always have enough money to buy the food I need 0 No I eat alone most of the time 0 No I take three or more different prescribed or over-the-counter drugs a 1 Yes day Without wanting to, I have lost or gained 10 pounds in the last six 0 No months I am not always physically able to shop, cook and/or feed myself 0 No Nutrition Protocols Good Risk Protocol 0 No interventions needed Moderate Risk Protocol Electronic Signature(s) Signed: 10/02/2016 6:03:08 PM By: Gretta Cool, RN, BSN, Kim RN, BSN Entered By: Gretta Cool, RN, BSN, Kim on 10/02/2016 13:09:08

## 2016-10-07 ENCOUNTER — Ambulatory Visit: Payer: Medicare Other | Admitting: Internal Medicine

## 2016-10-07 NOTE — Progress Notes (Signed)
Sharon [name], Sharon [name] ([id number]) Visit Report for 10/02/2016 Chief Complaint Document Details Patient Name: Sharon [name], Sharon [name] 10/02/2016 12:30 Date of Service: PM Medical Record [id number] Number: Patient Account Number: [id number] Dec 06, 1945 (70 y.o. Treating RN: Cornell Barman Date of Birth/Sex: Female) Other Clinician: Primary Care Physician: Baltazar Apo, Chari Parmenter Referring Physician: Merrie Roof Physician/Extender: Suella Grove in Treatment: 0 Information Obtained from: Patient Chief Complaint Mrs. Padley presents today for evaluation of bilateral lower extremity traumatic ulcerations Electronic Signature(s) Signed: 10/03/2016 9:50:03 AM By: Rene Kocher, NP, Elliannah Wayment Entered By: Rene Kocher, NP, Aiman Noe on 10/03/2016 09:50:03 Sharon [name], Sharon [name] ([id number]) -------------------------------------------------------------------------------- Debridement Details Patient Name: Sharon [name], Sharon [name] 10/02/2016 12:30 Date of Service: PM Medical Record [id number] Number: Patient Account Number: [id number] [birth year] (70 y.o. Treating RN: Cornell Barman Date of Birth/Sex: Female) Other Clinician: Primary Care Physician: Kathrine Haddock Treating Loghan Kurtzman Referring Physician: Merrie Roof Physician/Extender: Suella Grove in Treatment: 0 Debridement Performed for Wound #1 Left Lower Leg Assessment: Performed By: Physician Lawanda Cousins, NP Debridement: Open Wound/Selective Debridement Selective Description: Pre-procedure Yes - 13:59 Verification/Time Out Taken: Start Time: 14:00 Pain Control: Lidocaine 4% Topical Solution Total Area Debrided (L x 2.8 (cm) x 1 (cm) = 2.8 (cm) W): Tissue and other Non-Viable, Fibrin/Slough, Subcutaneous material debrided: Instrument: Curette Bleeding: Minimum Hemostasis Achieved: Pressure End Time: 14:05 Procedural Pain: 3 Post Procedural Pain: 1 Response to Treatment: Procedure was tolerated well Post Debridement Measurements of Total Wound Character of Wound/Ulcer  Post Requires Further Debridement Debridement: Severity of Tissue Post Debridement: Fat layer exposed Post Procedure Diagnosis Same as Pre-procedure Electronic Signature(s) Signed: 10/03/2016 9:49:28 AM By: Rene Kocher, NP, Alyha Marines Signed: 10/03/2016 10:52:44 AM By: Gretta Cool RN, BSN, Kim RN, BSN Previous Signature: 10/02/2016 6:03:08 PM Version By: Gretta Cool, RN, BSN, Kim RN, BSN Entered By: Rene Kocher, NP, Daire Okimoto on 10/03/2016 09:49:28 Sharon [name], Sharon [name] ([id number], Sharon [name] ([id number]) -------------------------------------------------------------------------------- HPI Details Patient Name: Sharon [name], Sharon [name] 10/02/2016 12:30 Date of Service: PM Medical Record [id number] Number: Patient Account Number: [id number] [birth year] (70 y.o. Treating RN: Cornell Barman Date of Birth/Sex: Female) Other Clinician: Primary Care Physician: Kathrine Haddock Treating Kristoffer Bala Referring Physician: Merrie Roof Physician/Extender: Suella Grove in Treatment: 0 History of Present Illness Location: left lower extremity and right lower extremity Quality: Patient reports experiencing a sharp pain to left leg ulcer more so than right Severity: minimal at rest, 7-8 with manipulation Duration: pain is intermittent Context: both wounds are results of traumatic injury HPI Description: 10/02/16 - Mrs. Scites presents today for evaluation of a left lower extremity ulceration that has been present since Thanksgiving (2017). She states that this is a traumatic injury against a cardboard box. She originally presented to [facility], saw Merrie Roof, [place], on 11/27 at which time she was prescribed Bactrim and to continue with triple antibiotic ointment. She returned on 12/4 at which time a culture was obtained and she was prescribed a two-week course of Augmentin. Culture results were no gross. She again returned to that office on 12/18 with concerns of infection secondary to redness and at that time she was prescribed  penicillin 250 mg which she is currently taking. In the interim she did abrade her right lateral leg on a vacuum cleaner on 09/19/2016. She has continued to apply triple antibiotic ointment, covered with a Band-Aid to both wounds. She denies any systemic effects of fever, chills, or overall ill feeling. She does carry a history of, but not limited to, asthma, COPD, anxiety, osteoporosis, cervical cancer, TMJ, postmenopausal, pneumonia, arthritis, abdominal hysterectomy, tonsillectomy and adenoidectomy, bladder  Botox, shoulder arthroscopy with rotator cuff repair, bladder suspension. She is not on any systemic anti- inflammatory or immunosuppressive therapy. She admits to smoking less than a quarter pack a day off and on over the last 50 years and quit September 2017. She denies any vascular intervention to either leg. Electronic Signature(s) Signed: 10/03/2016 9:37:56 AM By: Rene Kocher, NP, Tamitha Norell Entered By: Rene Kocher, NP, Brissa Asante on 10/03/2016 09:37:56 Alazay, Wandell Chauncey Cruel ([id number]) -------------------------------------------------------------------------------- Physical Exam Details Patient Name: Sharon [name], Sharon [name] 10/02/2016 12:30 Date of Service: PM Medical Record [id number] Number: Patient Account Number: [id number] [birth year] (70 y.o. Treating RN: Cornell Barman Date of Birth/Sex: Female) Other Clinician: Primary Care Physician: Kathrine Haddock Treating Agron Swiney Referring Physician: Merrie Roof Physician/Extender: Suella Grove in Treatment: 0 Constitutional BP within normal limits. afebrile. well nourished; well developed; appears stated age;Marland Kitchen Respiratory non-labored respiratory effort. clear to all fields. Cardiovascular S1 S2 with regular rate and rhythm. non-palpable DP and PT(both audible with Doppler), palpable popliteal. warm extremities; no edema present; cap refill less than or equal to 3 seconds. Gastrointestinal (GI) soft, non-tender, denies nausea vomiting or  diarrhea. Musculoskeletal ambulated without assistance; steady gait. Integumentary (Hair, Skin) LLE - ulcer is dry with necrotic debris, remains dry even after debridement with minimal bleeding, no periwound erythema no periwound thermal changes; RLE - ulcer is much more superficial with dry exudate easily removed revealing a red wound base. no induration, no fluctuance, minimal pain with debridement otherwise none at rest. Psychiatric appears to make sound judgement and have accurate insight regarding healthcare. oriented to time, place, person and situation. slightly anxious, pleasant, conversive. Electronic Signature(s) Signed: 10/03/2016 9:45:43 AM By: Rene Kocher, NP, Lizzeth Meder Entered By: Rene Kocher, NP, Belvia Gotschall on 10/03/2016 09:45:42 Sharon [name], Sharon [name] ([id number]) -------------------------------------------------------------------------------- Physician Orders Details Patient Name: Sharon [name], Sharon [name] 10/02/2016 12:30 Date of Service: PM Medical Record [id number] Number: Patient Account Number: [id number] 1946-09-13 (70 y.o. Treating RN: Cornell Barman Date of Birth/Sex: Female) Other Clinician: Primary Care Physician: Kathrine Haddock Treating Clerance Umland Referring Physician: Merrie Roof Physician/Extender: Suella Grove in Treatment: 0 Verbal / Phone Orders: Yes Clinician: Cornell Barman Read Back and Verified: Yes Diagnosis Coding Wound Cleansing Wound #1 Left Lower Leg o Clean wound with wound cleanser. Wound #2 Right Lower Leg o Clean wound with wound cleanser. Anesthetic Wound #1 Left Lower Leg o Topical Lidocaine 4% cream applied to wound bed prior to debridement Wound #2 Right Lower Leg o Topical Lidocaine 4% cream applied to wound bed prior to debridement Primary Wound Dressing Wound #1 Left Lower Leg o Hydrogel Secondary Dressing Wound #1 Left Lower Leg o Other - telfa isand Wound #2 Right Lower Leg o Other - telfa isand Dressing Change Frequency Wound #1 Left Lower  Leg o Change dressing every day. Wound #2 Right Lower Leg o Change dressing every day. Follow-up Appointments Wound #1 Left Lower Leg Teaster, Milanie H. ([id number]) o Return Appointment in 1 week. Wound #2 Right Lower Leg o Return Appointment in 1 week. Additional Orders / Instructions Wound #1 Left Lower Leg o Increase protein intake. Wound #2 Right Lower Leg o Increase protein intake. Medications-please add to medication list. Wound #1 Left Lower Leg o P.O. Antibiotics - continue antibiotics Wound #2 Right Lower Leg o P.O. Antibiotics - continue antibiotics Services and Therapies o Arterial Studies- Unilateral - Left Electronic Signature(s) Signed: 10/03/2016 9:45:59 AM By: Rene Kocher, NP, Buzz Axel Previous Signature: 10/02/2016 6:03:08 PM Version By: Gretta Cool, RN, BSN, Kim RN, BSN Entered By: Rene Kocher, NP, Jacaden Forbush on 10/03/2016 09:45:59 Sharon [name], Chauncey Cruel ([id number]) -------------------------------------------------------------------------------- Problem  List Details Patient Name: SHAQUANDA, SEIN 10/02/2016 12:30 Date of Service: PM Medical Record [id number] Number: Patient Account Number: [id number] 03/25/1946 (70 y.o. Treating RN: Cornell Barman Date of Birth/Sex: Female) Other Clinician: Primary Care Physician: Kathrine Haddock Treating Lamonte Hartt Referring Physician: Merrie Roof Physician/Extender: Suella Grove in Treatment: 0 Active Problems ICD-10 Encounter Code Description Active Date Diagnosis L97.822 Non-pressure chronic ulcer of other part of left lower leg 10/03/2016 Yes with fat layer exposed S81.811A Laceration without foreign body, right lower leg, initial 10/03/2016 Yes encounter Inactive Problems Resolved Problems Electronic Signature(s) Signed: 10/03/2016 9:53:38 AM By: Rene Kocher, NP, Charlsey Moragne Entered By: Rene Kocher, NP, Ayomikun Starling on 10/03/2016 09:53:38 Dadamo, Chauncey Cruel  ([id number]) -------------------------------------------------------------------------------- Progress Note Details Patient Name: [name]. 10/02/2016 12:30 Date of Service: PM Medical Record [id number] Number: Patient Account Number: [id number] [birth year] (70 y.o. Treating RN: Cornell Barman Date of Birth/Sex: Female) Other Clinician: Primary Care Physician: Baltazar Apo, Dezire Turk Referring Physician: Merrie Roof Physician/Extender: Suella Grove in Treatment: 0 Subjective Chief Complaint Information obtained from Patient Mrs. Tussing presents today for evaluation of bilateral lower extremity traumatic ulcerations History of Present Illness (HPI) The following HPI elements were documented for the patient's wound: Location: left lower extremity and right lower extremity Quality: Patient reports experiencing a sharp pain to left leg ulcer more so than right Severity: minimal at rest, 7-8 with manipulation Duration: pain is intermittent Context: both wounds are results of traumatic injury 10/02/16 - Mrs. Dejoseph presents today for evaluation of a left lower extremity ulceration that has been present since Thanksgiving (2017). She states that this is a traumatic injury against a cardboard box. She originally presented to [facility], saw Merrie Roof, [place], on 11/27 at which time she was prescribed Bactrim and to continue with triple antibiotic ointment. She returned on 12/4 at which time a culture was obtained and she was prescribed a two-week course of Augmentin. Culture results were no gross. She again returned to that office on 12/18 with concerns of infection secondary to redness and at that time she was prescribed penicillin 250 mg which she is currently taking. In the interim she did abrade her right lateral leg on a vacuum cleaner on 09/19/2016. She has continued to apply triple antibiotic ointment, covered with a Band-Aid to both wounds. She denies any  systemic effects of fever, chills, or overall ill feeling. She does carry a history of, but not limited to, asthma, COPD, anxiety, osteoporosis, cervical cancer, TMJ, postmenopausal, pneumonia, arthritis, abdominal hysterectomy, tonsillectomy and adenoidectomy, bladder Botox, shoulder arthroscopy with rotator cuff repair, bladder suspension. She is not on any systemic anti- inflammatory or immunosuppressive therapy. She admits to smoking less than a quarter pack a day off and on over the last 50 years and quit September 2017. She denies any vascular intervention to either leg. Wound History Patient presents with 2 open wounds that have been present for approximately 4 weeks. Patient has been treating wounds in the following manner: peroxide, neosporin, ABX. Laboratory tests have not been performed in the last month. Patient reportedly has not tested positive for an antibiotic resistant organism. Patient reportedly has not tested positive for osteomyelitis. Patient reportedly has not had testing performed to evaluate circulation in the legs. LANAISHA, RAFFA ([id number]) Patient History Information obtained from Patient. Allergies ibuprofen, latex, nut - unspecified Family History Diabetes - Mother, Heart Disease - Mother, Stroke - Maternal Grandparents, No family history of Cancer, Hypertension, Kidney Disease, Lung Disease, Seizures, Thyroid Problems, Tuberculosis. Social History Former smoker - Quit  Sept 2017, Marital Status - Married, Alcohol Use - Rarely, Drug Use - No History, Caffeine Use - Daily. Medical History Eyes Denies history of Cataracts, Glaucoma Ear/Nose/Mouth/Throat Denies history of Chronic sinus problems/congestion, Middle ear problems Hematologic/Lymphatic Denies history of Anemia, Hemophilia, Human Immunodeficiency Virus, Lymphedema, Sickle Cell Disease Respiratory Patient has history of Asthma, Chronic Obstructive Pulmonary Disease (COPD) Denies history of  Aspiration, Pneumothorax, Sleep Apnea, Tuberculosis Cardiovascular Denies history of Angina, Arrhythmia, Congestive Heart Failure, Coronary Artery Disease, Deep Vein Thrombosis, Hypertension, Hypotension, Myocardial Infarction, Peripheral Arterial Disease, Peripheral Venous Disease, Phlebitis, Vasculitis Gastrointestinal Denies history of Cirrhosis , Colitis, Crohn s, Hepatitis A, Hepatitis B, Hepatitis C Endocrine Denies history of Type I Diabetes, Type II Diabetes Genitourinary Denies history of End Stage Renal Disease Immunological Denies history of Lupus Erythematosus, Raynaud s, Scleroderma Integumentary (Skin) Denies history of History of Burn, History of pressure wounds Musculoskeletal Patient has history of Osteoarthritis - hands Denies history of Gout, Rheumatoid Arthritis, Osteomyelitis Neurologic Denies history of Dementia, Neuropathy, Quadriplegia, Paraplegia, Seizure Disorder Oncologic Denies history of Received Chemotherapy, Received Radiation Psychiatric Denies history of Anorexia/bulimia, Confinement Anxiety Review of Systems (ROS) Gilleland, Taleya H. ([id number]) Constitutional Symptoms (General Health) The patient has no complaints or symptoms. Eyes Complains or has symptoms of Dry Eyes, Glasses / Contacts. Denies complaints or symptoms of Vision Changes. Ear/Nose/Mouth/Throat The patient has no complaints or symptoms. Hematologic/Lymphatic The patient has no complaints or symptoms. Respiratory The patient has no complaints or symptoms. Cardiovascular The patient has no complaints or symptoms. Gastrointestinal The patient has no complaints or symptoms. Endocrine Denies complaints or symptoms of Hepatitis, Thyroid disease, Polydypsia (Excessive Thirst). Genitourinary Complains or has symptoms of Incontinence/dribbling - wears pads. Denies complaints or symptoms of Kidney failure/ Dialysis. Immunological Denies complaints or symptoms of Hives,  Itching. Integumentary (Skin) Complains or has symptoms of Wounds, Bleeding or bruising tendency. Denies complaints or symptoms of Breakdown, Swelling. Musculoskeletal The patient has no complaints or symptoms. Neurologic The patient has no complaints or symptoms. Oncologic The patient has no complaints or symptoms, Cervical cancer in 1970's Psychiatric Complains or has symptoms of Claustrophobia. Denies complaints or symptoms of Anxiety. Objective Constitutional BP within normal limits. afebrile. well nourished; well developed; appears stated age;Marland Kitchen Vitals Time Taken: 12:57 PM, Height: 68 in, Source: Stated, Weight: 174.5 lbs, Source: Measured, BMI: 26.5, Temperature: 98.4 F, Pulse: 68 bpm, Respiratory Rate: 16 breaths/min, Blood Pressure: 130/58 mmHg. RIKIA, TRINKLE ([id number]) Respiratory non-labored respiratory effort. clear to all fields. Cardiovascular S1 S2 with regular rate and rhythm. non-palpable DP and PT(both audible with Doppler), palpable popliteal. warm extremities; no edema present; cap refill less than or equal to 3 seconds. Gastrointestinal (GI) soft, non-tender, denies nausea vomiting or diarrhea. Musculoskeletal ambulated without assistance; steady gait. Psychiatric appears to make sound judgement and have accurate insight regarding healthcare. oriented to time, place, person and situation. slightly anxious, pleasant, conversive. Integumentary (Hair, Skin) LLE - ulcer is dry with necrotic debris, remains dry even after debridement with minimal bleeding, no periwound erythema no periwound thermal changes; RLE - ulcer is much more superficial with dry exudate easily removed revealing a red wound base. no induration, no fluctuance, minimal pain with debridement otherwise none at rest. Wound #1 status is Open. Original cause of wound was Trauma. The wound is located on the Left Lower Leg. The wound measures 2.8cm length x 1cm width x 0.1cm depth; 2.199cm^2 area  and 0.22cm^3 volume. There is fat exposed. There is no tunneling or undermining noted. There is a none  present amount of drainage noted. The wound margin is flat and intact. There is small (1-33%) pink granulation within the wound bed. There is a large (67-100%) amount of necrotic tissue within the wound bed including Eschar and Adherent Slough. The periwound skin appearance exhibited: Ecchymosis. The periwound skin appearance did not exhibit: Callus, Crepitus, Excoriation, Fluctuance, Friable, Induration, Localized Edema, Rash, Scarring, Dry/Scaly, Maceration, Moist, Atrophie Blanche, Cyanosis, Hemosiderin Staining, Mottled, Pallor, Rubor, Erythema. Periwound temperature was noted as No Abnormality. The periwound has tenderness on palpation. Wound #2 status is Open. Original cause of wound was Trauma. The wound is located on the Right Lower Leg. The wound measures 0.9cm length x 0.5cm width x 0.1cm depth; 0.353cm^2 area and 0.035cm^3 volume. The wound is limited to skin breakdown. There is no tunneling or undermining noted. There is a none present amount of drainage noted. The wound margin is flat and intact. There is no granulation within the wound bed. There is a large (67-100%) amount of necrotic tissue within the wound bed including Eschar. The periwound skin appearance exhibited: Dry/Scaly. The periwound skin appearance did not exhibit: Callus, Crepitus, Excoriation, Fluctuance, Friable, Induration, Localized Edema, Rash, Scarring, Maceration, Moist, Atrophie Blanche, Cyanosis, Ecchymosis, Hemosiderin Staining, Mottled, Pallor, Rubor, Erythema. NIKIRA, WILLETS ([id number]) Assessment Active Problems ICD-10 [id number] - Non-pressure chronic ulcer of other part of left lower leg with fat layer exposed S81.811A - Laceration without foreign body, right lower leg, initial encounter Procedures Wound #1 Wound #1 is a Trauma, Other located on the Left Lower Leg . There was an Open Wound  debridement with total area of 2.8 sq cm performed by Lawanda Cousins, NP. with the following instrument(s): Curette to remove Non-Viable tissue/material including Fibrin/Slough and Subcutaneous after achieving pain control using Lidocaine 4% Topical Solution. A time out was conducted at 13:59, prior to the start of the procedure. A Minimum amount of bleeding was controlled with Pressure. The procedure was tolerated well with a pain level of 3 throughout and a pain level of 1 following the procedure. Character of Wound/Ulcer Post Debridement requires further debridement. Severity of Tissue Post Debridement is: Fat layer exposed. Post procedure Diagnosis Wound #1: Same as Pre-Procedure Plan Wound Cleansing: Wound #1 Left Lower Leg: Clean wound with wound cleanser. Wound #2 Right Lower Leg: Clean wound with wound cleanser. Anesthetic: Wound #1 Left Lower Leg: Topical Lidocaine 4% cream applied to wound bed prior to debridement Wound #2 Right Lower Leg: Topical Lidocaine 4% cream applied to wound bed prior to debridement Primary Wound Dressing: Wound #1 Left Lower Leg: Hydrogel Secondary Dressing: Wound #1 Left Lower Leg: Other - telfa isand Holderman, Sharon H. ([id number]) Wound #2 Right Lower Leg: Other - telfa island Dressing Change Frequency: Wound #1 Left Lower Leg: Change dressing every day. Wound #2 Right Lower Leg: Change dressing every day. Follow-up Appointments: Wound #1 Left Lower Leg: Return Appointment in 1 week. Wound #2 Right Lower Leg: Return Appointment in 1 week. Additional Orders / Instructions: Wound #1 Left Lower Leg: Increase protein intake. Wound #2 Right Lower Leg: Increase protein intake. Medications-please add to medication list.: Wound #1 Left Lower Leg: P.O. Antibiotics - continue antibiotics Wound #2 Right Lower Leg: P.O. Antibiotics - continue antibiotics Services and Therapies ordered were: Arterial Studies- Unilateral - Left Follow-Up  Appointments: A Patient Clinical Summary of Care was provided to MM 1. hydrogel to both ulcerations, covered with dry sterile dressing, change every other day 2. obtain arterial studies of the left leg in light of a decreased  ABI, dry wound base, and history of smoking 3. follow-up next week 4. Maintain adequate to increase protein intake and vitamin supplementation Electronic Signature(s) Signed: 10/03/2016 9:53:52 AM By: Rene Kocher, NP, Shyann Hefner Previous Signature: 10/03/2016 9:50:15 AM Version By: Rene Kocher, NP, Loretta Doutt Previous Signature: 10/03/2016 9:48:09 AM Version By: Rene Kocher, NP, Pink Maye Entered By: Rene Kocher, NP, Kerryann Allaire on 10/03/2016 09:53:51 Gintz, Chauncey Cruel ([id number]) BESA, LOWTHER ([id number]) -------------------------------------------------------------------------------- ROS/PFSH Details Patient Name: [name] Date of Service: 10/02/2016 12:30 PM Medical Record Patient Account Number: [id number] Number: Treating RN: Cornell Barman October 19, 1945 (70 y.o. Other Clinician: Date of Birth/Sex: Female) Treating ROBSON, MICHAEL Primary Care Physician/Extender: Melford Aase, [place] Physician: Referring Physician: Merrie Roof Weeks in Treatment: 0 Information Obtained From Patient Wound History Do you currently have one or more open woundso Yes How many open wounds do you currently haveo 2 Approximately how long have you had your woundso 4 weeks How have you been treating your wound(s) until nowo peroxide, neosporin, ABX Has your wound(s) ever healed and then re-openedo No Have you had any lab work done in the past montho No Have you tested positive for an antibiotic resistant organism (MRSA, No VRE)o Have you tested positive for osteomyelitis (bone infection)o No Have you had any tests for circulation on your legso No Eyes Complaints and Symptoms: Positive for: Dry Eyes; Glasses / Contacts Negative for: Vision Changes Medical History: Negative for: Cataracts;  Glaucoma Endocrine Complaints and Symptoms: Negative for: Hepatitis; Thyroid disease; Polydypsia (Excessive Thirst) Medical History: Negative for: Type I Diabetes; Type II Diabetes Genitourinary Complaints and Symptoms: Positive for: Incontinence/dribbling - wears pads Negative for: Kidney failure/ Dialysis Medical HistoryCAMERA, HAKER ([id number]) Negative for: End Stage Renal Disease Immunological Complaints and Symptoms: Negative for: Hives; Itching Medical History: Negative for: Lupus Erythematosus; Raynaudos; Scleroderma Integumentary (Skin) Complaints and Symptoms: Positive for: Wounds; Bleeding or bruising tendency Negative for: Breakdown; Swelling Medical History: Negative for: History of Burn; History of pressure wounds Neurologic Complaints and Symptoms: No Complaints or Symptoms Complaints and Symptoms: Negative for: Numbness/parasthesias; Focal/Weakness Medical History: Negative for: Dementia; Neuropathy; Quadriplegia; Paraplegia; Seizure Disorder Psychiatric Complaints and Symptoms: Positive for: Claustrophobia Negative for: Anxiety Medical History: Negative for: Anorexia/bulimia; Confinement Anxiety Constitutional Symptoms (General Health) Complaints and Symptoms: No Complaints or Symptoms Ear/Nose/Mouth/Throat Complaints and Symptoms: No Complaints or Symptoms Medical History: Negative for: Chronic sinus problems/congestion; Middle ear problems Tapia, Jayleene H. ([id number]) Hematologic/Lymphatic Complaints and Symptoms: No Complaints or Symptoms Medical History: Negative for: Anemia; Hemophilia; Human Immunodeficiency Virus; Lymphedema; Sickle Cell Disease Respiratory Complaints and Symptoms: No Complaints or Symptoms Medical History: Positive for: Asthma; Chronic Obstructive Pulmonary Disease (COPD) Negative for: Aspiration; Pneumothorax; Sleep Apnea; Tuberculosis Cardiovascular Complaints and Symptoms: No Complaints or Symptoms Medical  History: Negative for: Angina; Arrhythmia; Congestive Heart Failure; Coronary Artery Disease; Deep Vein Thrombosis; Hypertension; Hypotension; Myocardial Infarction; Peripheral Arterial Disease; Peripheral Venous Disease; Phlebitis; Vasculitis Gastrointestinal Complaints and Symptoms: No Complaints or Symptoms Medical History: Negative for: Cirrhosis ; Colitis; Crohnos; Hepatitis A; Hepatitis B; Hepatitis C Musculoskeletal Complaints and Symptoms: No Complaints or Symptoms Medical History: Positive for: Osteoarthritis - hands Negative for: Gout; Rheumatoid Arthritis; Osteomyelitis Oncologic Complaints and Symptoms: No Complaints or Symptoms Complaints and Symptoms: Review of System Notes: Cervical cancer in 1970's CARLEISHA, NEWBERY ([id number]) Medical History: Negative for: Received Chemotherapy; Received Radiation Immunizations Pneumococcal Vaccine: Received Pneumococcal Vaccination: Yes Family and Social History Cancer: No; Diabetes: Yes - Mother; Heart Disease: Yes - Mother; Hypertension: No; Kidney Disease: No; Lung Disease: No; Seizures: No; Stroke: Yes -  Maternal Grandparents; Thyroid Problems: No; Tuberculosis: No; Former smoker - Quit Sept 2017; Marital Status - Married; Alcohol Use: Rarely; Drug Use: No History; Caffeine Use: Daily; Financial Concerns: No; Food, Clothing or Shelter Needs: No; Support System Lacking: No; Transportation Concerns: No; Advanced Directives: No; Patient does not want information on Advanced Directives; Do not resuscitate: No; Living Will: No; Medical Power of Attorney: No Electronic Signature(s) Signed: 10/02/2016 6:03:08 PM By: Gretta Cool RN, BSN, Kim RN, BSN Signed: 10/07/2016 3:32:00 PM By: Linton Ham MD Entered By: Gretta Cool RN, BSN, Kim on 10/02/2016 13:07:26 ASALEE, SYMINGTON ([id number]) -------------------------------------------------------------------------------- SuperBill Details Patient Name: [name] Date of Service:  10/02/2016 Medical Record Number: [id number] Patient Account Number: [id number] Date of Birth/Sex: 16-Apr-1946 (70 y.o. Female) Treating RN: Cornell Barman Primary Care Physician: Kathrine Haddock Other Clinician: Referring Physician: Merrie Roof Treating Physician/Extender: Cathie Olden in Treatment: 0 Diagnosis Coding ICD-10 Codes Code Description [phone] Non-pressure chronic ulcer of other part of left lower leg with fat layer exposed S81.811A Laceration without foreign body, right lower leg, initial encounter Facility Procedures CPT4: Description Modifier Quantity Code [id number] - WOUND CARE VISIT-LEV 3 EST PT 1 CPT4: [id number] - DEBRIDE WOUND 1ST 20 SQ CM OR < 1 ICD-10 Description Diagnosis L97.822 Non-pressure chronic ulcer of other part of left lower leg with fat layer exposed Physician Procedures CPT4: Description Modifier Quantity Code [id number] - WC PHYS LEVEL 3 - EST PT 25 1 ICD-10 Description Diagnosis L97.822 Non-pressure chronic ulcer of other part of left lower leg with fat layer exposed S81.811A Laceration without foreign body, right  lower leg, initial encounter CPT4: [id number] - WC PHYS DEBR WO ANESTH 20 SQ CM 1 ICD-10 Description Diagnosis L97.822 Non-pressure chronic ulcer of other part of left lower leg with fat layer exposed Electronic Signature(s) Signed: 10/03/2016 9:54:27 AM By: Rene Kocher, NP, Jennette Banker, Chauncey Cruel ([id number]) Previous Signature: 10/03/2016 9:54:09 AM Version By: Rene Kocher, NP, Izick Gasbarro Entered By: Rene Kocher, NP, Braven Wolk on 10/03/2016 09:54:26

## 2016-10-08 ENCOUNTER — Other Ambulatory Visit: Payer: Self-pay | Admitting: Internal Medicine

## 2016-10-08 DIAGNOSIS — S81809D Unspecified open wound, unspecified lower leg, subsequent encounter: Secondary | ICD-10-CM

## 2016-10-09 ENCOUNTER — Encounter: Payer: Medicare Other | Admitting: Surgery

## 2016-10-09 DIAGNOSIS — F419 Anxiety disorder, unspecified: Secondary | ICD-10-CM | POA: Diagnosis not present

## 2016-10-09 DIAGNOSIS — S81811A Laceration without foreign body, right lower leg, initial encounter: Secondary | ICD-10-CM | POA: Diagnosis not present

## 2016-10-09 DIAGNOSIS — L97822 Non-pressure chronic ulcer of other part of left lower leg with fat layer exposed: Secondary | ICD-10-CM | POA: Diagnosis not present

## 2016-10-09 DIAGNOSIS — M810 Age-related osteoporosis without current pathological fracture: Secondary | ICD-10-CM | POA: Diagnosis not present

## 2016-10-09 DIAGNOSIS — Z87891 Personal history of nicotine dependence: Secondary | ICD-10-CM | POA: Diagnosis not present

## 2016-10-09 DIAGNOSIS — J449 Chronic obstructive pulmonary disease, unspecified: Secondary | ICD-10-CM | POA: Diagnosis not present

## 2016-10-09 NOTE — Progress Notes (Signed)
Sharon [name], Sharon [name] ([id number]) Visit Report for 10/09/2016 Arrival Information Details Patient Name: Sharon [name], Sharon [name] Date of Service: 10/09/2016 10:30 AM Medical Record Number: [id number] Patient Account Number: [id number] Date of Birth/Sex: [birth year] (70 y.o. Female) Treating RN: Cornell Barman Primary Care Physician: Kathrine Haddock Other Clinician: Referring Physician: Ricard Dillon Treating Physician/Extender: Frann Rider in Treatment: 1 Visit Information History Since Last Visit Added or deleted any medications: No Patient Arrived: Ambulatory Any new allergies or adverse reactions: No Arrival Time: 10:20 Had a fall or experienced change in No Accompanied By: self activities of daily living that may affect Transfer Assistance: None risk of falls: Patient Identification Verified: Yes Signs or symptoms of abuse/neglect since last No Secondary Verification Process Yes visito Completed: Hospitalized since last visit: No Patient Requires Transmission-Based No Has Dressing in Place as Prescribed: Yes Precautions: Pain Present Now: No Patient Has Alerts: No Electronic Signature(s) Signed: 10/09/2016 2:11:50 PM By: Gretta Cool, RN, BSN, Kim RN, BSN Entered By: Gretta Cool, RN, BSN, Kim on 10/09/2016 10:20:58 Sharon [name] ([id number]) -------------------------------------------------------------------------------- Encounter Discharge Information Details Patient Name: Sharon [name] Date of Service: 10/09/2016 10:30 AM Medical Record Number: [id number] Patient Account Number: [id number] Date of Birth/Sex: 10-11-1946 (70 y.o. Female) Treating RN: Cornell Barman Primary Care Physician: Kathrine Haddock Other Clinician: Referring Physician: Ricard Dillon Treating Physician/Extender: Frann Rider in Treatment: 1 Encounter Discharge Information Items Discharge Pain Level: 0 Discharge Condition: Stable Ambulatory Status: Ambulatory Discharge Destination: Home Transportation:  Ambulance Accompanied By: self Schedule Follow-up Appointment: Yes Medication Reconciliation completed and provided to Patient/Care Yes Johann Gascoigne: Provided on Clinical Summary of Care: 10/09/2016 Form Type Recipient Paper Patient MM Electronic Signature(s) Signed: 10/09/2016 10:50:33 AM By: Ruthine Dose Entered By: Ruthine Dose on 10/09/2016 10:50:33 Sharon [name] ([id number]) -------------------------------------------------------------------------------- Lower Extremity Assessment Details Patient Name: Sharon [name] Date of Service: 10/09/2016 10:30 AM Medical Record Number: [id number] Patient Account Number: [id number] Date of Birth/Sex: 1946/02/22 (70 y.o. Female) Treating RN: Cornell Barman Primary Care Physician: Kathrine Haddock Other Clinician: Referring Physician: Ricard Dillon Treating Physician/Extender: Frann Rider in Treatment: 1 Vascular Assessment Pulses: Dorsalis Pedis Palpable: [Left:Yes] [Right:Yes] Posterior Tibial Extremity colors, hair growth, and conditions: Extremity Color: [Left:Hyperpigmented] [Right:Hyperpigmented] Hair Growth on Extremity: [Left:Yes] [Right:Yes] Temperature of Extremity: [Left:Warm] [Right:Warm] Capillary Refill: [Left:< 3 seconds] [Right:< 3 seconds] Dependent Rubor: [Left:No] [Right:No] Blanched when Elevated: [Left:No] [Right:No] Lipodermatosclerosis: [Left:No] [Right:No] Electronic Signature(s) Signed: 10/09/2016 2:11:50 PM By: Gretta Cool, RN, BSN, Kim RN, BSN Entered By: Gretta Cool, RN, BSN, Kim on 10/09/2016 10:26:11 Sharon [name], Sharon [name] ([id number]) -------------------------------------------------------------------------------- Multi Wound Chart Details Patient Name: Sharon [name] Date of Service: 10/09/2016 10:30 AM Medical Record Number: [id number] Patient Account Number: [id number] Date of Birth/Sex: 03-22-1946 (70 y.o. Female) Treating RN: Cornell Barman Primary Care Physician: Kathrine Haddock Other Clinician: Referring  Physician: Ricard Dillon Treating Physician/Extender: Frann Rider in Treatment: 1 Vital Signs Height(in): 68 Pulse(bpm): 79 Weight(lbs): 174.5 Blood Pressure 127/55 (mmHg): Body Mass Index(BMI): 27 Temperature(F): 98 Respiratory Rate 16 (breaths/min): Photos: [N/A:N/A] Wound Location: Left Lower Leg Right Lower Leg N/A Wounding Event: Trauma Trauma N/A Primary Etiology: Trauma, Other Trauma, Other N/A Comorbid History: Asthma, Chronic Asthma, Chronic N/A Obstructive Pulmonary Obstructive Pulmonary Disease (COPD), Disease (COPD), Osteoarthritis Osteoarthritis Date Acquired: 09/06/2016 09/13/2016 N/A Weeks of Treatment: 1 1 N/A Wound Status: Open Open N/A Measurements L x W x D 1x1.1x0.2 0.4x0.2x0.1 N/A (cm) Area (cm) : 0.864 0.063 N/A Volume (cm) : 0.173 0.006 N/A % Reduction in Area: 60.70% 82.20% N/A % Reduction  in Volume: 21.40% 82.90% N/A Classification: Full Thickness Without Full Thickness Without N/A Exposed Support Exposed Support Structures Structures Exudate Amount: Medium Medium N/A Exudate Type: Serous Serous N/A Exudate Color: amber amber N/A Wound Margin: Flat and Intact Flat and Intact N/A Granulation Amount: Small (1-33%) Medium (34-66%) N/A Liddell, Jajaira H. ([id number]) Granulation Quality: Pink Pink N/A Necrotic Amount: Large (67-100%) Small (1-33%) N/A Necrotic Tissue: Eschar, Adherent Slough Adherent Slough N/A Exposed Structures: Fat: Yes Fat: Yes N/A Fascia: No Fascia: No Tendon: No Tendon: No Muscle: No Muscle: No Joint: No Joint: No Bone: No Bone: No Epithelialization: None None N/A Periwound Skin Texture: Edema: No Edema: No N/A Excoriation: No Excoriation: No Induration: No Induration: No Callus: No Callus: No Crepitus: No Crepitus: No Fluctuance: No Fluctuance: No Friable: No Friable: No Rash: No Rash: No Scarring: No Scarring: No Periwound Skin Moist: Yes Moist: Yes N/A Moisture: Maceration:  No Maceration: No Dry/Scaly: No Dry/Scaly: No Periwound Skin Color: Erythema: Yes Atrophie Blanche: No N/A Atrophie Blanche: No Cyanosis: No Cyanosis: No Ecchymosis: No Ecchymosis: No Erythema: No Hemosiderin Staining: No Hemosiderin Staining: No Mottled: No Mottled: No Pallor: No Pallor: No Rubor: No Rubor: No Erythema Location: Circumferential N/A N/A Temperature: No Abnormality N/A N/A Tenderness on Yes No N/A Palpation: Wound Preparation: Ulcer Cleansing: Ulcer Cleansing: N/A Rinsed/Irrigated with Rinsed/Irrigated with Saline Saline Topical Anesthetic Topical Anesthetic Applied: Other: liociane Applied: Other: lidocaine 4% 4% Treatment Notes Electronic Signature(s) Signed: 10/09/2016 2:11:50 PM By: Gretta Cool, RN, BSN, Kim RN, BSN Entered By: Gretta Cool, RN, BSN, Kim on 10/09/2016 10:30:18 Sharon [name], Sharon [name] ([id number]) -------------------------------------------------------------------------------- Multi-Disciplinary Care Plan Details Patient Name: Sharon [name] Date of Service: 10/09/2016 10:30 AM Medical Record Number: [id number] Patient Account Number: [id number] Date of Birth/Sex: [birth year] (70 y.o. Female) Treating RN: Cornell Barman Primary Care Physician: Kathrine Haddock Other Clinician: Referring Physician: Ricard Dillon Treating Physician/Extender: Frann Rider in Treatment: 1 Active Inactive Orientation to the Wound Care Program Nursing Diagnoses: Knowledge deficit related to the wound healing center program Goals: Patient/caregiver will verbalize understanding of the [facility] Program Date Initiated: 10/02/2016 Goal Status: Active Interventions: Provide education on orientation to the wound center Notes: Pain, Acute or Chronic Nursing Diagnoses: Pain Management - Non-cyclic Acute (Procedural) Goals: Patient/caregiver will verbalize comfort level met Date Initiated: 10/02/2016 Goal Status: Active Interventions: Encourage patient  to take pain medications as prescribed Implement pain control techniques (non-pharmaceutical) Reposition patient for comfort Treatment Activities: Administer pain control measures as ordered : 10/02/2016 Notes: Wound/Skin Impairment Nursing Diagnoses: Sharon [name], Sharon [name] ([id number]) Impaired tissue integrity Goals: Patient/caregiver will verbalize understanding of skin care regimen Date Initiated: 10/02/2016 Goal Status: Active Interventions: Assess patient/caregiver ability to obtain necessary supplies Notes: Electronic Signature(s) Signed: 10/09/2016 2:11:50 PM By: Gretta Cool, RN, BSN, Kim RN, BSN Entered By: Gretta Cool, RN, BSN, Kim on 10/09/2016 10:30:11 Sharon [name], Sharon [name] ([id number]) -------------------------------------------------------------------------------- Pain Assessment Details Patient Name: Sharon [name] Date of Service: 10/09/2016 10:30 AM Medical Record Number: [id number] Patient Account Number: [id number] Date of Birth/Sex: 05-21-1946 (70 y.o. Female) Treating RN: Cornell Barman Primary Care Physician: Kathrine Haddock Other Clinician: Referring Physician: Ricard Dillon Treating Physician/Extender: Frann Rider in Treatment: 1 Active Problems Location of Pain Severity and Description of Pain Patient Has Paino No Site Locations With Dressing Change: No Pain Management and Medication Current Pain Management: Electronic Signature(s) Signed: 10/09/2016 2:11:50 PM By: Gretta Cool, RN, BSN, Kim RN, BSN Entered By: Gretta Cool, RN, BSN, Kim on 10/09/2016 10:21:04 Sharon [name] ([id number]) -------------------------------------------------------------------------------- Patient/Caregiver Education Details Patient  Name: Sharon [name], Sharon [name] Date of Service: 10/09/2016 10:30 AM Medical Record Number: [id number] Patient Account Number: [id number] Date of Birth/Gender: [birth year] (70 y.o. Female) Treating RN: Cornell Barman Primary Care Physician: Kathrine Haddock Other Clinician: Referring  Physician: Ricard Dillon Treating Physician/Extender: Frann Rider in Treatment: 1 Education Assessment Education Provided To: Patient Education Topics Provided Wound/Skin Impairment: Handouts: Caring for Your Ulcer, Other: patient to buy 15-20 compression Methods: Explain/Verbal Responses: State content correctly Electronic Signature(s) Signed: 10/09/2016 2:11:50 PM By: Gretta Cool, RN, BSN, Kim RN, BSN Entered By: Gretta Cool, RN, BSN, Kim on 10/09/2016 10:47:32 Sharon [name], Sharon [name] ([id number]) -------------------------------------------------------------------------------- Wound Assessment Details Patient Name: Sharon [name] Date of Service: 10/09/2016 10:30 AM Medical Record Number: [id number] Patient Account Number: [id number] Date of Birth/Sex: 15-Nov-1945 (70 y.o. Female) Treating RN: Cornell Barman Primary Care Physician: Kathrine Haddock Other Clinician: Referring Physician: Ricard Dillon Treating Physician/Extender: Frann Rider in Treatment: 1 Wound Status Wound Number: 1 Primary Trauma, Other Etiology: Wound Location: Left Lower Leg Wound Open Wounding Event: Trauma Status: Date Acquired: 09/06/2016 Comorbid Asthma, Chronic Obstructive Weeks Of Treatment: 1 History: Pulmonary Disease (COPD), Clustered Wound: No Osteoarthritis Photos Wound Measurements Length: (cm) 1 Width: (cm) 1.1 Depth: (cm) 0.2 Area: (cm) 0.864 Volume: (cm) 0.173 % Reduction in Area: 60.7% % Reduction in Volume: 21.4% Epithelialization: None Tunneling: No Undermining: No Wound Description Full Thickness Without Exposed Classification: Support Structures Wound Margin: Flat and Intact Exudate Medium Amount: Exudate Type: Serous Exudate Color: amber Foul Odor After Cleansing: No Wound Bed Granulation Amount: Small (1-33%) Exposed Structure Granulation Quality: Pink Fascia Exposed: No Necrotic Amount: Large (67-100%) Fat Layer Exposed: Yes Necrotic Quality: Eschar,  Adherent Slough Tendon Exposed: No Muscle Exposed: No Sharon [name], Sharon H. ([id number]) Joint Exposed: No Bone Exposed: No Periwound Skin Texture Texture Color No Abnormalities Noted: No No Abnormalities Noted: No Callus: No Atrophie Blanche: No Crepitus: No Cyanosis: No Excoriation: No Ecchymosis: No Fluctuance: No Erythema: Yes Friable: No Erythema Location: Circumferential Induration: No Hemosiderin Staining: No Localized Edema: No Mottled: No Rash: No Pallor: No Scarring: No Rubor: No Moisture Temperature / Pain No Abnormalities Noted: No Temperature: No Abnormality Dry / Scaly: No Tenderness on Palpation: Yes Maceration: No Moist: Yes Wound Preparation Ulcer Cleansing: Rinsed/Irrigated with Saline Topical Anesthetic Applied: Other: liociane 4%, Treatment Notes Wound #1 (Left Lower Leg) 1. Cleansed with: Clean wound with Normal Saline 2. Anesthetic Topical Lidocaine 4% cream to wound bed prior to debridement 4. Dressing Applied: Aquacel Ag 5. Secondary [place] Signature(s) Signed: 10/09/2016 2:11:50 PM By: Gretta Cool, RN, BSN, Kim RN, BSN Entered By: Gretta Cool, RN, BSN, Kim on 10/09/2016 10:28:58 Sharon [name], Sharon [name] ([id number]) -------------------------------------------------------------------------------- Wound Assessment Details Patient Name: Sharon [name] Date of Service: 10/09/2016 10:30 AM Medical Record Number: [id number] Patient Account Number: [id number] Date of Birth/Sex: 10/03/1946 (70 y.o. Female) Treating RN: Cornell Barman Primary Care Physician: Kathrine Haddock Other Clinician: Referring Physician: Ricard Dillon Treating Physician/Extender: Frann Rider in Treatment: 1 Wound Status Wound Number: 2 Primary Trauma, Other Etiology: Wound Location: Right Lower Leg Wound Open Wounding Event: Trauma Status: Date Acquired: 09/13/2016 Comorbid Asthma, Chronic Obstructive Weeks Of Treatment: 1 History: Pulmonary  Disease (COPD), Clustered Wound: No Osteoarthritis Photos Wound Measurements Length: (cm) 0.4 Width: (cm) 0.2 Depth: (cm) 0.1 Area: (cm) 0.063 Volume: (cm) 0.006 % Reduction in Area: 82.2% % Reduction in Volume: 82.9% Epithelialization: None Tunneling: No Undermining: No Wound Description Full Thickness Without Exposed Classification: Support Structures Wound Margin: Flat and Intact Exudate Medium  Amount: Exudate Type: Serous Exudate Color: amber Foul Odor After Cleansing: No Wound Bed Granulation Amount: Medium (34-66%) Exposed Structure Granulation Quality: Pink Fascia Exposed: No Necrotic Amount: Small (1-33%) Fat Layer Exposed: Yes Necrotic Quality: Adherent Slough Tendon Exposed: No Muscle Exposed: No Kopper, Heavenleigh H. ([id number]) Joint Exposed: No Bone Exposed: No Periwound Skin Texture Texture Color No Abnormalities Noted: No No Abnormalities Noted: No Callus: No Atrophie Blanche: No Crepitus: No Cyanosis: No Excoriation: No Ecchymosis: No Fluctuance: No Erythema: No Friable: No Hemosiderin Staining: No Induration: No Mottled: No Localized Edema: No Pallor: No Rash: No Rubor: No Scarring: No Moisture No Abnormalities Noted: No Dry / Scaly: No Maceration: No Moist: Yes Wound Preparation Ulcer Cleansing: Rinsed/Irrigated with Saline Topical Anesthetic Applied: Other: lidocaine 4%, Treatment Notes Wound #2 (Right Lower Leg) 1. Cleansed with: Clean wound with Normal Saline 2. Anesthetic Topical Lidocaine 4% cream to wound bed prior to debridement 4. Dressing Applied: Aquacel Ag 5. Secondary [place] Signature(s) Signed: 10/09/2016 2:11:50 PM By: Gretta Cool, RN, BSN, Kim RN, BSN Entered By: Gretta Cool, RN, BSN, Kim on 10/09/2016 10:30:03 CARLO, LORSON ([id number]) -------------------------------------------------------------------------------- [place] Details Patient Name: Sharon [name] Date of Service:  10/09/2016 10:30 AM Medical Record Number: [id number] Patient Account Number: [id number] Date of Birth/Sex: 1946-09-26 (70 y.o. Female) Treating RN: Cornell Barman Primary Care Physician: Kathrine Haddock Other Clinician: Referring Physician: Ricard Dillon Treating Physician/Extender: Frann Rider in Treatment: 1 Vital Signs Time Taken: 10:21 Temperature (F): 98 Height (in): 68 Pulse (bpm): 79 Weight (lbs): 174.5 Respiratory Rate (breaths/min): 16 Body Mass Index (BMI): 26.5 Blood Pressure (mmHg): 127/55 Reference Range: 80 - 120 mg / dl Electronic Signature(s) Signed: 10/09/2016 2:11:50 PM By: Gretta Cool, RN, BSN, Kim RN, BSN Entered By: Gretta Cool, RN, BSN, Kim on 10/09/2016 10:21:42

## 2016-10-10 NOTE — Progress Notes (Signed)
Sharon [name], Sharon [name] ([id number]) Visit Report for 10/09/2016 Chief Complaint Document Details Patient Name: Sharon [name], Sharon [name] 10/09/2016 10:30 Date of Service: AM Medical Record [id number] Number: Patient Account Number: [id number] Mar 04, 1946 (71 y.o. Treating RN: Cornell Barman Date of Birth/Sex: Female) Other Clinician: Primary Care Physician: Kathrine Haddock Treating Christin Fudge Referring Physician: Ricard Dillon Physician/Extender: Suella Grove in Treatment: 1 Information Obtained from: Patient Chief Complaint Sharon [name] presents today for evaluation of bilateral lower extremity traumatic ulcerations Electronic Signature(s) Signed: 10/09/2016 11:13:24 AM By: Christin Fudge MD, FACS Entered By: Christin Fudge on 10/09/2016 11:13:24 Sharon [name], Sharon [name] ([id number]) -------------------------------------------------------------------------------- Debridement Details Patient Name: Sharon [name], Sharon [name] 10/09/2016 10:30 Date of Service: AM Medical Record [id number] Number: Patient Account Number: [id number] [birth year] (70 y.o. Treating RN: Cornell Barman Date of Birth/Sex: Female) Other Clinician: Primary Care Physician: Kathrine Haddock Treating Zebulen Simonis Referring Physician: Ricard Dillon Physician/Extender: Suella Grove in Treatment: 1 Debridement Performed for Wound #1 Left Lower Leg Assessment: Performed By: Physician Christin Fudge, MD Debridement: Debridement Pre-procedure Yes - 10:34 Verification/Time Out Taken: Start Time: 10:35 Pain Control: Other : lidocaine 4% Level: Skin/Subcutaneous Tissue Total Area Debrided (L x 1 (cm) x 1.1 (cm) = 1.1 (cm) W): Tissue and other Viable, Non-Viable, Exudate, Fibrin/Slough, Subcutaneous material debrided: Instrument: Curette Bleeding: Minimum Hemostasis Achieved: Pressure End Time: 10:36 Procedural Pain: 3 Post Procedural Pain: 0 Response to Treatment: Procedure was tolerated well Post Debridement Measurements of Total Wound Length: (cm)  1 Width: (cm) 1.1 Depth: (cm) 0.3 Volume: (cm) 0.259 Character of Wound/Ulcer Post Requires Further Debridement Debridement: Severity of Tissue Post Debridement: Fat layer exposed Post Procedure Diagnosis Same as Pre-procedure Electronic Signature(s) Signed: 10/09/2016 11:13:19 AM By: Christin Fudge MD, FACS Signed: 10/09/2016 2:11:50 PM By: Gretta Cool RN, BSN, Kim RN, BSN [name], [name] Entered By: Christin Fudge on 10/09/2016 11:13:19 Sharon [name], Sharon [name] ([id number]) -------------------------------------------------------------------------------- HPI Details Patient Name: Sharon [name], Sharon [name] 10/09/2016 10:30 Date of Service: AM Medical Record [id number] Number: Patient Account Number: [id number] 12-27-1945 (70 y.o. Treating RN: Cornell Barman Date of Birth/Sex: Female) Other Clinician: Primary Care Physician: Kathrine Haddock Treating Shayne Diguglielmo Referring Physician: Ricard Dillon Physician/Extender: Suella Grove in Treatment: 1 History of Present Illness Location: left lower extremity and right lower extremity Quality: Patient reports experiencing a sharp pain to left leg ulcer more so than right Severity: minimal at rest, 7-8 with manipulation Duration: pain is intermittent Context: both wounds are results of traumatic injury HPI Description: 10/02/16 - Sharon [name] presents today for evaluation of a left lower extremity ulceration that has been present since Thanksgiving (2017). She states that this is a traumatic injury against a cardboard box. She originally presented to [facility], saw Merrie Roof, [place], on 11/27 at which time she was prescribed Bactrim and to continue with triple antibiotic ointment. She returned on 12/4 at which time a culture was obtained and she was prescribed a two-week course of Augmentin. Culture results were no gross. She again returned to that office on 12/18 with concerns of infection secondary to redness and at that time she was  prescribed penicillin 250 mg which she is currently taking. In the interim she did abrade her right lateral leg on a vacuum cleaner on 09/19/2016. She has continued to apply triple antibiotic ointment, covered with a Band-Aid to both wounds. She denies any systemic effects of fever, chills, or overall ill feeling. She does carry a history of, but not limited to, asthma, COPD, anxiety, osteoporosis, cervical cancer, TMJ, postmenopausal, pneumonia, arthritis, abdominal hysterectomy, tonsillectomy  and adenoidectomy, bladder Botox, shoulder arthroscopy with rotator cuff repair, bladder suspension. She is not on any systemic anti- inflammatory or immunosuppressive therapy. She admits to smoking less than a quarter pack a day off and on over the last 50 years and quit September 2017. She denies any vascular intervention to either leg. 10/09/2016 -- her arterial studies are still pending. She does have a setup for varicose veins from her clinical examination but we will not do this study as yet. If the wound is stalled and not proceeding well with healing we may need to do a venous duplex study for reflux Electronic Signature(s) Signed: 10/09/2016 11:14:06 AM By: Christin Fudge MD, FACS Entered By: Christin Fudge on 10/09/2016 11:14:06 Sharon [name] ([id number]) -------------------------------------------------------------------------------- Physical Exam Details Patient Name: Sharon [name], Sharon [name] 10/09/2016 10:30 Date of Service: AM Medical Record [id number] Number: Patient Account Number: [id number] [birth year] (70 y.o. Treating RN: Cornell Barman Date of Birth/Sex: Female) Other Clinician: Primary Care Physician: Kathrine Haddock Treating Christin Fudge Referring Physician: Ricard Dillon Physician/Extender: Weeks in Treatment: 1 Constitutional . Pulse regular. Respirations normal and unlabored. Afebrile. . Eyes Nonicteric. Reactive to light. Ears, Nose, Mouth, and Throat Lips, teeth, and gums  WNL.Marland Kitchen Moist mucosa without lesions. Neck supple and nontender. No palpable supraclavicular or cervical adenopathy. Normal sized without goiter. Respiratory WNL. No retractions.. Breath sounds WNL, No rubs, rales, rhonchi, or wheeze.. Cardiovascular Heart rhythm and rate regular, no murmur or gallop.. Pedal Pulses WNL. No clubbing, cyanosis or edema. Lymphatic No adneopathy. No adenopathy. No adenopathy. Musculoskeletal Adexa without tenderness or enlargement.. Digits and nails w/o clubbing, cyanosis, infection, petechiae, ischemia, or inflammatory conditions.. Integumentary (Hair, Skin) No suspicious lesions. No crepitus or fluctuance. No peri-wound warmth or erythema. No masses.Marland Kitchen Psychiatric Judgement and insight Intact.. No evidence of depression, anxiety, or agitation.. Notes the wound on the left lower extremity had some necrotic debris and this was sharply removed with a #3 curet and bleeding controlled with pressure Electronic Signature(s) Signed: 10/09/2016 11:14:28 AM By: Christin Fudge MD, FACS Entered By: Christin Fudge on 10/09/2016 11:14:27 Sharon [name] ([id number]) -------------------------------------------------------------------------------- Physician Orders Details Patient Name: Sharon [name], Sharon [name] 10/09/2016 10:30 Date of Service: AM Medical Record [id number] Number: Patient Account Number: [id number] 10/19/1945 (70 y.o. Treating RN: Cornell Barman Date of Birth/Sex: Female) Other Clinician: Primary Care Physician: Kathrine Haddock Treating Christin Fudge Referring Physician: Ricard Dillon Physician/Extender: Suella Grove in Treatment: 1 Verbal / Phone Orders: Yes Clinician: Cornell Barman Read Back and Verified: Yes Diagnosis Coding Wound Cleansing Wound #1 Left Lower Leg o Clean wound with wound cleanser. Wound #2 Right Lower Leg o Clean wound with wound cleanser. Anesthetic Wound #1 Left Lower Leg o Topical Lidocaine 4% cream applied to wound bed prior to  debridement Wound #2 Right Lower Leg o Topical Lidocaine 4% cream applied to wound bed prior to debridement Primary Wound Dressing Wound #1 Left Lower Leg o Aquacel Ag Wound #2 Right Lower Leg o Aquacel Ag Secondary Dressing Wound #1 Left Lower Leg o Other - telfa isand Wound #2 Right Lower Leg o Other - telfa isand Dressing Change Frequency Wound #1 Left Lower Leg o Change dressing every other day. Wound #2 Right Lower Leg o Change dressing every other day. KAYANNE, GOW ([id number]) Follow-up Appointments Wound #1 Left Lower Leg o Return Appointment in 1 week. Wound #2 Right Lower Leg o Return Appointment in 1 week. Additional Orders / Instructions Wound #1 Left Lower Leg o Increase protein intake. Wound #2 Right Lower Leg o  Increase protein intake. Medications-please add to medication list. Wound #1 Left Lower Leg o P.O. Antibiotics - continue antibiotics Wound #2 Right Lower Leg o P.O. Antibiotics - continue antibiotics Electronic Signature(s) Signed: 10/09/2016 2:11:50 PM By: Gretta Cool RN, BSN, Kim RN, BSN Signed: 10/09/2016 4:37:15 PM By: Christin Fudge MD, FACS Entered By: Gretta Cool RN, BSN, Kim on 10/09/2016 10:40:06 Sharon [name], Sharon [name] ([id number]) -------------------------------------------------------------------------------- Problem List Details Patient Name: Sharon [name], Sharon [name] 10/09/2016 10:30 Date of Service: AM Medical Record [id number] Number: Patient Account Number: [id number] 1946-06-01 (70 y.o. Treating RN: Cornell Barman Date of Birth/Sex: Female) Other Clinician: Primary Care Physician: Kathrine Haddock Treating Christin Fudge Referring Physician: Ricard Dillon Physician/Extender: Suella Grove in Treatment: 1 Active Problems ICD-10 Encounter Code Description Active Date Diagnosis L97.822 Non-pressure chronic ulcer of other part of left lower leg 10/03/2016 Yes with fat layer exposed S81.811A Laceration without foreign body, right  lower leg, initial 10/03/2016 Yes encounter Inactive Problems Resolved Problems Electronic Signature(s) Signed: 10/09/2016 11:12:56 AM By: Christin Fudge MD, FACS Entered By: Christin Fudge on 10/09/2016 11:12:56 Sharon [name] ([id number]) -------------------------------------------------------------------------------- Progress Note Details Patient Name: Sharon [name]. 10/09/2016 10:30 Date of Service: AM Medical Record [id number] Number: Patient Account Number: [id number] December 29, 1945 (70 y.o. Treating RN: Cornell Barman Date of Birth/Sex: Female) Other Clinician: Primary Care Physician: Kathrine Haddock Treating Christin Fudge Referring Physician: Ricard Dillon Physician/Extender: Suella Grove in Treatment: 1 Subjective Chief Complaint Information obtained from Patient Mrs. Druschel presents today for evaluation of bilateral lower extremity traumatic ulcerations History of Present Illness (HPI) The following HPI elements were documented for the patient's wound: Location: left lower extremity and right lower extremity Quality: Patient reports experiencing a sharp pain to left leg ulcer more so than right Severity: minimal at rest, 7-8 with manipulation Duration: pain is intermittent Context: both wounds are results of traumatic injury 10/02/16 - Mrs. Padovano presents today for evaluation of a left lower extremity ulceration that has been present since Thanksgiving (2017). She states that this is a traumatic injury against a cardboard box. She originally presented to [facility], saw Merrie Roof, [place], on 11/27 at which time she was prescribed Bactrim and to continue with triple antibiotic ointment. She returned on 12/4 at which time a culture was obtained and she was prescribed a two-week course of Augmentin. Culture results were no gross. She again returned to that office on 12/18 with concerns of infection secondary to redness and at that time she was prescribed penicillin 250  mg which she is currently taking. In the interim she did abrade her right lateral leg on a vacuum cleaner on 09/19/2016. She has continued to apply triple antibiotic ointment, covered with a Band-Aid to both wounds. She denies any systemic effects of fever, chills, or overall ill feeling. She does carry a history of, but not limited to, asthma, COPD, anxiety, osteoporosis, cervical cancer, TMJ, postmenopausal, pneumonia, arthritis, abdominal hysterectomy, tonsillectomy and adenoidectomy, bladder Botox, shoulder arthroscopy with rotator cuff repair, bladder suspension. She is not on any systemic anti- inflammatory or immunosuppressive therapy. She admits to smoking less than a quarter pack a day off and on over the last 50 years and quit September 2017. She denies any vascular intervention to either leg. 10/09/2016 -- her arterial studies are still pending. She does have a setup for varicose veins from her clinical examination but we will not do this study as yet. If the wound is stalled and not proceeding well with healing we may need to do a venous duplex study for  reflux Sharon [name], Sharon [name]. ([id number]) Objective Constitutional Pulse regular. Respirations normal and unlabored. Afebrile. Vitals Time Taken: 10:21 AM, Height: 68 in, Weight: 174.5 lbs, BMI: 26.5, Temperature: 98 F, Pulse: 79 bpm, Respiratory Rate: 16 breaths/min, Blood Pressure: 127/55 mmHg. Eyes Nonicteric. Reactive to light. Ears, Nose, Mouth, and Throat Lips, teeth, and gums WNL.Marland Kitchen Moist mucosa without lesions. Neck supple and nontender. No palpable supraclavicular or cervical adenopathy. Normal sized without goiter. Respiratory WNL. No retractions.. Breath sounds WNL, No rubs, rales, rhonchi, or wheeze.. Cardiovascular Heart rhythm and rate regular, no murmur or gallop.. Pedal Pulses WNL. No clubbing, cyanosis or edema. Lymphatic No adneopathy. No adenopathy. No adenopathy. Musculoskeletal Adexa without tenderness or  enlargement.. Digits and nails w/o clubbing, cyanosis, infection, petechiae, ischemia, or inflammatory conditions.Marland Kitchen Psychiatric Judgement and insight Intact.. No evidence of depression, anxiety, or agitation.. General Notes: the wound on the left lower extremity had some necrotic debris and this was sharply removed with a #3 curet and bleeding controlled with pressure Integumentary (Hair, Skin) No suspicious lesions. No crepitus or fluctuance. No peri-wound warmth or erythema. No masses.. Wound #1 status is Open. Original cause of wound was Trauma. The wound is located on the Left Lower Leg. The wound measures 1cm length x 1.1cm width x 0.2cm depth; 0.864cm^2 area and 0.173cm^3 volume. There is fat exposed. There is no tunneling or undermining noted. There is a medium amount of serous drainage noted. The wound margin is flat and intact. There is small (1-33%) pink granulation within the wound bed. There is a large (67-100%) amount of necrotic tissue within the wound bed including Eschar and Adherent Slough. The periwound skin appearance exhibited: Moist, Erythema. The periwound skin appearance did not exhibit: Callus, Crepitus, Excoriation, Fluctuance, Friable, Induration, Localized Edema, Rash, Scarring, Dry/Scaly, Maceration, Atrophie Blanche, Cyanosis, Ecchymosis, Hemosiderin Staining, Graumann, Brittini [name]. ([id number]) Mottled, Pallor, Rubor. The surrounding wound skin color is noted with erythema which is circumferential. Periwound temperature was noted as No Abnormality. The periwound has tenderness on palpation. Wound #2 status is Open. Original cause of wound was Trauma. The wound is located on the Right Lower Leg. The wound measures 0.4cm length x 0.2cm width x 0.1cm depth; 0.063cm^2 area and 0.006cm^3 volume. There is fat exposed. There is no tunneling or undermining noted. There is a medium amount of serous drainage noted. The wound margin is flat and intact. There is medium (34-66%) pink  granulation within the wound bed. There is a small (1-33%) amount of necrotic tissue within the wound bed including Adherent Slough. The periwound skin appearance exhibited: Moist. The periwound skin appearance did not exhibit: Callus, Crepitus, Excoriation, Fluctuance, Friable, Induration, Localized Edema, Rash, Scarring, Dry/Scaly, Maceration, Atrophie Blanche, Cyanosis, Ecchymosis, Hemosiderin Staining, Mottled, Pallor, Rubor, Erythema. Assessment Active Problems ICD-10 [phone] - Non-pressure chronic ulcer of other part of left lower leg with fat layer exposed S81.811A - Laceration without foreign body, right lower leg, initial encounter Procedures Wound #1 Wound #1 is a Trauma, Other located on the Left Lower Leg . There was a Skin/Subcutaneous Tissue Debridement [id number]) debridement with total area of 1.1 sq cm performed by Christin Fudge, MD. with the following instrument(s): Curette to remove Viable and Non-Viable tissue/material including Exudate, Fibrin/Slough, and Subcutaneous after achieving pain control using Other (lidocaine 4%). A time out was conducted at 10:34, prior to the start of the procedure. A Minimum amount of bleeding was controlled with Pressure. The procedure was tolerated well with a pain level of 3 throughout and a pain level of 0 following  the procedure. Post Debridement Measurements: 1cm length x 1.1cm width x 0.3cm depth; 0.259cm^3 volume. Character of Wound/Ulcer Post Debridement requires further debridement. Severity of Tissue Post Debridement is: Fat layer exposed. Post procedure Diagnosis Wound #1: Same as Pre-Procedure Sharon [name], Sharon [name]. ([id number]) Plan Wound Cleansing: Wound #1 Left Lower Leg: Clean wound with wound cleanser. Wound #2 Right Lower Leg: Clean wound with wound cleanser. Anesthetic: Wound #1 Left Lower Leg: Topical Lidocaine 4% cream applied to wound bed prior to debridement Wound #2 Right Lower Leg: Topical Lidocaine 4% cream  applied to wound bed prior to debridement Primary Wound Dressing: Wound #1 Left Lower Leg: Aquacel Ag Wound #2 Right Lower Leg: Aquacel Ag Secondary Dressing: Wound #1 Left Lower Leg: Other - telfa isand Wound #2 Right Lower Leg: Other - telfa isand Dressing Change Frequency: Wound #1 Left Lower Leg: Change dressing every other day. Wound #2 Right Lower Leg: Change dressing every other day. Follow-up Appointments: Wound #1 Left Lower Leg: Return Appointment in 1 week. Wound #2 Right Lower Leg: Return Appointment in 1 week. Additional Orders / Instructions: Wound #1 Left Lower Leg: Increase protein intake. Wound #2 Right Lower Leg: Increase protein intake. Medications-please add to medication list.: Wound #1 Left Lower Leg: P.O. Antibiotics - continue antibiotics Wound #2 Right Lower Leg: P.O. Antibiotics - continue antibiotics Sharon [name], Sharon [name]. ([id number]) After debridement and review I have recommended Aquacel Ag with a bordered foam to be changed every other day. We have discussed appropriate dressing changes and have discussed the rational for her arterial and possible venous studies at a later date. She has had all questions answered. She will see [place] back next week. Electronic Signature(s) Signed: 10/09/2016 11:16:12 AM By: Christin Fudge MD, FACS Entered By: Christin Fudge on 10/09/2016 11:16:11 Sharon [name] ([id number]) -------------------------------------------------------------------------------- SuperBill Details Patient Name: Sharon [name] Date of Service: 10/09/2016 Medical Record Number: [id number] Patient Account Number: [id number] Date of Birth/Sex: 02/22/1946 (70 y.o. Female) Treating RN: Cornell Barman Primary Care Physician: Kathrine Haddock Other Clinician: Referring Physician: Ricard Dillon Treating Physician/Extender: Frann Rider in Treatment: 1 Diagnosis Coding ICD-10 Codes Code Description [phone] Non-pressure chronic ulcer of other  part of left lower leg with fat layer exposed S81.811A Laceration without foreign body, right lower leg, initial encounter Facility Procedures CPT4: Description Modifier Quantity Code [id number] - DEB SUBQ TISSUE 20 SQ CM/< 1 ICD-10 Description Diagnosis L97.822 Non-pressure chronic ulcer of other part of left lower leg with fat layer exposed S81.811A Laceration without foreign body, right  lower leg, initial encounter Physician Procedures CPT4: Description Modifier Quantity Code [id number] - WC PHYS SUBQ TISS 20 SQ CM 1 ICD-10 Description Diagnosis L97.822 Non-pressure chronic ulcer of other part of left lower leg with fat layer exposed S81.811A Laceration without foreign body, right  lower leg, initial encounter Electronic Signature(s) Signed: 10/09/2016 11:16:25 AM By: Christin Fudge MD, FACS Entered By: Christin Fudge on 10/09/2016 11:16:25

## 2016-10-14 ENCOUNTER — Ambulatory Visit: Payer: Medicare Other

## 2016-10-14 DIAGNOSIS — S81809D Unspecified open wound, unspecified lower leg, subsequent encounter: Secondary | ICD-10-CM

## 2016-10-14 DIAGNOSIS — L98499 Non-pressure chronic ulcer of skin of other sites with unspecified severity: Secondary | ICD-10-CM | POA: Diagnosis not present

## 2016-10-16 ENCOUNTER — Ambulatory Visit: Payer: Medicare Other | Admitting: Surgery

## 2016-10-17 ENCOUNTER — Encounter: Payer: Self-pay | Admitting: Unknown Physician Specialty

## 2016-10-17 ENCOUNTER — Ambulatory Visit (INDEPENDENT_AMBULATORY_CARE_PROVIDER_SITE_OTHER): Payer: Medicare Other | Admitting: Unknown Physician Specialty

## 2016-10-17 VITALS — BP 127/73 | HR 84 | Temp 98.1°F | Ht 68.5 in | Wt 175.6 lb

## 2016-10-17 DIAGNOSIS — Z7189 Other specified counseling: Secondary | ICD-10-CM

## 2016-10-17 DIAGNOSIS — J449 Chronic obstructive pulmonary disease, unspecified: Secondary | ICD-10-CM | POA: Diagnosis not present

## 2016-10-17 DIAGNOSIS — J029 Acute pharyngitis, unspecified: Secondary | ICD-10-CM

## 2016-10-17 DIAGNOSIS — Z0001 Encounter for general adult medical examination with abnormal findings: Secondary | ICD-10-CM

## 2016-10-17 DIAGNOSIS — R300 Dysuria: Secondary | ICD-10-CM

## 2016-10-17 DIAGNOSIS — Z0000 Encounter for general adult medical examination without abnormal findings: Secondary | ICD-10-CM

## 2016-10-17 LAB — UA/M W/RFLX CULTURE, ROUTINE
Bilirubin, UA: NEGATIVE
GLUCOSE, UA: NEGATIVE
Ketones, UA: NEGATIVE
Leukocytes, UA: NEGATIVE
NITRITE UA: NEGATIVE
PROTEIN UA: NEGATIVE
RBC UA: NEGATIVE
SPEC GRAV UA: 1.01 (ref 1.005–1.030)
Urobilinogen, Ur: 0.2 mg/dL (ref 0.2–1.0)
pH, UA: 7 (ref 5.0–7.5)

## 2016-10-17 MED ORDER — FLUCONAZOLE 150 MG PO TABS: 150.0000 mg | ORAL_TABLET | Freq: Once | ORAL | 0 refills | Status: AC

## 2016-10-17 MED ORDER — PREDNISONE 20 MG PO TABS: 40.0000 mg | ORAL_TABLET | Freq: Every day | ORAL | 0 refills | Status: DC

## 2016-10-17 MED ORDER — DOXYCYCLINE HYCLATE 100 MG PO TABS: 100.0000 mg | ORAL_TABLET | Freq: Two times a day (BID) | ORAL | 0 refills | Status: DC

## 2016-10-17 MED ORDER — FIRST-DUKES MOUTHWASH MT SUSP: 5.0000 mL | Freq: Four times a day (QID) | OROMUCOSAL | 1 refills | Status: DC

## 2016-10-17 NOTE — Assessment & Plan Note (Signed)
Pt wants her son to be her health care decision maker.  Documents were given

## 2016-10-17 NOTE — Assessment & Plan Note (Signed)
Stable but AECB.  Will treat with Doxycycline.   Rx for Diflucan

## 2016-10-17 NOTE — Patient Instructions (Addendum)
[place]  [phone]  Home Instead

## 2016-10-17 NOTE — Progress Notes (Signed)
BP 127/73 (BP Location: Left Arm, Patient Position: Sitting, Cuff Size: Large)   Pulse 84   Temp 98.1 F (36.7 C)   Ht 5' 8.5" (1.74 m)   Wt 175 lb 9.6 oz (79.7 kg)   LMP  (LMP Unknown)   SpO2 98%   BMI 26.31 kg/m    Subjective:    Patient ID: Sharon [name], female    DOB: 1946/07/01, 71 y.o.   MRN: [id number]  HPI: Sharon [name] is a 71 y.o. female  Chief Complaint  Patient presents with  . Medicare Wellness  . Sore Throat    pt states she has had a sore throat since Wednesday, states she also has yellow/green phlegm, cough  . Urinary Tract Infection    pt states she has been having some burning with urination for about a week now    Functional Status Survey: Is the patient deaf or have difficulty hearing?: Yes (right ear) Does the patient have difficulty seeing, even when wearing glasses/contacts?: Yes Does the patient have difficulty concentrating, remembering, or making decisions?: No Does the patient have difficulty walking or climbing stairs?: No Does the patient have difficulty dressing or bathing?: No Does the patient have difficulty doing errands alone such as visiting a doctor's office or shopping?: No  Fall Risk  10/17/2016 10/17/2015  Falls in the past year? No No   Depression screen [facility] 2/9 10/17/2016 10/17/2015  Decreased Interest 0 0  Down, Depressed, Hopeless 1 0  PHQ - 2 Score 1 0    Mini cog negative  COPD Quit smoking Feels symptoms are well controlled Using medications without problems Night time symptoms:none ER visits since last visit:none Increased SOB: No Using O2:No  Sore Throat   This is a new problem. Episode onset: 2 days ago. The problem has been gradually worsening. There has been no fever. Associated symptoms include congestion, coughing and headaches. Associated symptoms comments: nausea. She has had no exposure to strep. She has tried NSAIDs for the symptoms. The treatment provided no relief.  Urinary Tract Infection   This is a  chronic problem. The problem occurs every urination. The quality of the pain is described as burning. She is not sexually active. There is no history of pyelonephritis. Associated symptoms include urgency. She has tried nothing for the symptoms.    Relevant past medical, surgical, family and social history reviewed and updated as indicated. Interim medical history since our last visit reviewed. Allergies and medications reviewed and updated.  Review of Systems  HENT: Positive for congestion.   Respiratory: Positive for cough.   Genitourinary: Positive for urgency.  Neurological: Positive for headaches.    Per HPI unless specifically indicated above     Objective:    BP 127/73 (BP Location: Left Arm, Patient Position: Sitting, Cuff Size: Large)   Pulse 84   Temp 98.1 F (36.7 C)   Ht 5' 8.5" (1.74 m)   Wt 175 lb 9.6 oz (79.7 kg)   LMP  (LMP Unknown)   SpO2 98%   BMI 26.31 kg/m   Wt Readings from Last 3 Encounters:  10/17/16 175 lb 9.6 oz (79.7 kg)  09/29/16 176 lb (79.8 kg)  09/22/16 175 lb (79.4 kg)    Physical Exam  Constitutional: She is oriented to person, place, and time. She appears well-developed and well-nourished. No distress.  HENT:  Head: Normocephalic and atraumatic.  Right Ear: Tympanic membrane and ear canal normal.  Left Ear: Tympanic membrane and ear canal normal.  Nose: Rhinorrhea present. Right sinus exhibits no maxillary sinus tenderness and no frontal sinus tenderness. Left sinus exhibits no maxillary sinus tenderness and no frontal sinus tenderness.  Mouth/Throat: Mucous membranes are normal. Posterior oropharyngeal erythema present.  Eyes: Conjunctivae and lids are normal. Pupils are equal, round, and reactive to light. Right eye exhibits no discharge. Left eye exhibits no discharge. No scleral icterus.  Neck: Normal range of motion. Neck supple. Carotid bruit is not present. No thyromegaly present.  Cardiovascular: Normal rate, regular rhythm and  normal heart sounds.  Exam reveals no gallop and no friction rub.   No murmur heard. Pulmonary/Chest: Effort normal and breath sounds normal. No respiratory distress. She has no wheezes. She has no rales.  Abdominal: Soft. Normal appearance and bowel sounds are normal. There is no splenomegaly or hepatomegaly. There is no tenderness. There is no rebound.  Genitourinary: No breast swelling, tenderness or discharge.  Musculoskeletal: Normal range of motion.  Lymphadenopathy:    She has no cervical adenopathy.  Neurological: She is alert and oriented to person, place, and time.  Skin: Skin is warm, dry and intact. No rash noted. No pallor.  Psychiatric: She has a normal mood and affect. Her speech is normal and behavior is normal. Judgment and thought content normal. Cognition and memory are normal.    Results for orders placed or performed in visit on 09/15/16  Wound culture  Result Value Ref Range   Gram Stain Result Final report    Result 1 Comment    RESULT 2 No organisms seen    Aerobic Bacterial Culture Final report    Result 1 Comment       Assessment & Plan:   Problem List Items Addressed This Visit      Unprioritized   Advanced care planning/counseling discussion    Pt wants her son to be her health care decision maker.  Documents were given      COPD (chronic obstructive pulmonary disease) (HCC)    Stable but AECB.  Will treat with Doxycycline.   Rx for Diflucan      Relevant Medications   predniSONE (DELTASONE) 20 MG tablet    Other Visit Diagnoses    Sore throat    -  Primary   Relevant Orders   Rapid strep screen (not at [facility])   Burning with urination       Relevant Orders   UA/M w/rflx Culture, Routine   Annual physical exam           Follow up plan: Return in about 1 year (around 10/17/2017), or if symptoms worsen or fail to improve.

## 2016-10-20 ENCOUNTER — Ambulatory Visit: Payer: Medicare Other | Admitting: Cardiovascular Disease

## 2016-10-20 ENCOUNTER — Encounter: Payer: Medicare Other | Attending: Surgery | Admitting: Surgery

## 2016-10-20 DIAGNOSIS — F419 Anxiety disorder, unspecified: Secondary | ICD-10-CM | POA: Diagnosis not present

## 2016-10-20 DIAGNOSIS — J449 Chronic obstructive pulmonary disease, unspecified: Secondary | ICD-10-CM | POA: Insufficient documentation

## 2016-10-20 DIAGNOSIS — Z886 Allergy status to analgesic agent status: Secondary | ICD-10-CM | POA: Diagnosis not present

## 2016-10-20 DIAGNOSIS — M1990 Unspecified osteoarthritis, unspecified site: Secondary | ICD-10-CM | POA: Diagnosis not present

## 2016-10-20 DIAGNOSIS — X58XXXA Exposure to other specified factors, initial encounter: Secondary | ICD-10-CM | POA: Diagnosis not present

## 2016-10-20 DIAGNOSIS — S81811A Laceration without foreign body, right lower leg, initial encounter: Secondary | ICD-10-CM | POA: Insufficient documentation

## 2016-10-20 DIAGNOSIS — L97822 Non-pressure chronic ulcer of other part of left lower leg with fat layer exposed: Secondary | ICD-10-CM | POA: Diagnosis not present

## 2016-10-20 DIAGNOSIS — L97819 Non-pressure chronic ulcer of other part of right lower leg with unspecified severity: Secondary | ICD-10-CM | POA: Diagnosis not present

## 2016-10-20 DIAGNOSIS — R32 Unspecified urinary incontinence: Secondary | ICD-10-CM | POA: Diagnosis not present

## 2016-10-20 DIAGNOSIS — M810 Age-related osteoporosis without current pathological fracture: Secondary | ICD-10-CM | POA: Insufficient documentation

## 2016-10-20 DIAGNOSIS — Z87891 Personal history of nicotine dependence: Secondary | ICD-10-CM | POA: Diagnosis not present

## 2016-10-20 NOTE — Progress Notes (Signed)
LIZMARIE, WITTERS ([id number]) Visit Report for 10/20/2016 Arrival Information Details Patient Name: TAILER, VOLKERT Date of Service: 10/20/2016 10:45 AM Medical Record Number: [id number] Patient Account Number: [id number] Date of Birth/Sex: 1946-01-06 (71 y.o. Female) Treating RN: Ahmed Prima Primary Care Physician: Kathrine Haddock Other Clinician: Referring Physician: Kathrine Haddock Treating Physician/Extender: Frann Rider in Treatment: 2 Visit Information History Since Last Visit All ordered tests and consults were completed: No Patient Arrived: Ambulatory Added or deleted any medications: No Arrival Time: 10:59 Any new allergies or adverse reactions: No Accompanied By: self Had a fall or experienced change in No Transfer Assistance: None activities of daily living that may affect Patient Identification Verified: Yes risk of falls: Secondary Verification Process Yes Signs or symptoms of abuse/neglect since last No Completed: visito Patient Requires Transmission-Based No Hospitalized since last visit: No Precautions: Has Dressing in Place as Prescribed: Yes Patient Has Alerts: No Pain Present Now: No Electronic Signature(s) Signed: 10/20/2016 3:33:30 PM By: Alric Quan Entered By: Alric Quan on 10/20/2016 11:00:27 [name] ([id number]) -------------------------------------------------------------------------------- Encounter Discharge Information Details Patient Name: [name] Date of Service: 10/20/2016 10:45 AM Medical Record Number: [id number] Patient Account Number: [id number] Date of Birth/Sex: 01/25/1946 (71 y.o. Female) Treating RN: Carolyne Fiscal, Debi Primary Care Physician: Kathrine Haddock Other Clinician: Referring Physician: Kathrine Haddock Treating Physician/Extender: Frann Rider in Treatment: 2 Encounter Discharge Information Items Discharge Pain Level: 0 Discharge Condition: Stable Ambulatory Status: Ambulatory Discharge  Destination: Home Transportation: Private Auto Accompanied By: self Schedule Follow-up Appointment: Yes Medication Reconciliation completed and provided to Patient/Care Yes Ondria Oswald: Provided on Clinical Summary of Care: 10/20/2016 Form Type Recipient Paper Patient MM Electronic Signature(s) Signed: 10/20/2016 11:26:11 AM By: Ruthine Dose Entered By: Ruthine Dose on 10/20/2016 11:26:11 [name] ([id number]) -------------------------------------------------------------------------------- Lower Extremity Assessment Details Patient Name: [name] Date of Service: 10/20/2016 10:45 AM Medical Record Number: [id number] Patient Account Number: [id number] Date of Birth/Sex: [birth year] (70 y.o. Female) Treating RN: Carolyne Fiscal, Debi Primary Care Physician: Kathrine Haddock Other Clinician: Referring Physician: Kathrine Haddock Treating Physician/Extender: Frann Rider in Treatment: 2 Vascular Assessment Pulses: Dorsalis Pedis Palpable: [Left:Yes] [Right:Yes] Posterior Tibial Extremity colors, hair growth, and conditions: Extremity Color: [Left:Mottled] [Right:Mottled] Temperature of Extremity: [Left:Warm] [Right:Warm] Capillary Refill: [Left:< 3 seconds] Toe Nail Assessment Left: Right: Thick: No No Discolored: No No Deformed: No No Improper Length and Hygiene: No No Electronic Signature(s) Signed: 10/20/2016 3:33:30 PM By: Alric Quan Entered By: Alric Quan on 10/20/2016 11:15:06 [name] ([id number]) -------------------------------------------------------------------------------- Multi Wound Chart Details Patient Name: [name] Date of Service: 10/20/2016 10:45 AM Medical Record Number: [id number] Patient Account Number: [id number] Date of Birth/Sex: [birth year] (71 y.o. Female) Treating RN: Carolyne Fiscal, Debi Primary Care Physician: Kathrine Haddock Other Clinician: Referring Physician: Kathrine Haddock Treating Physician/Extender: Frann Rider in Treatment: 2 Vital Signs Height(in): 68 Pulse(bpm): 77 Weight(lbs): 174.5 Blood Pressure 112/56 (mmHg): Body Mass Index(BMI): 27 Temperature(F): 97.8 Respiratory Rate 18 (breaths/min): Photos: [1:No Photos] [2:No Photos] [N/A:N/A] Wound Location: [1:Left Lower Leg] [2:Right Lower Leg] [N/A:N/A] Wounding Event: [1:Trauma] [2:Trauma] [N/A:N/A] Primary Etiology: [1:Trauma, Other] [2:Trauma, Other] [N/A:N/A] Comorbid History: [1:Asthma, Chronic Obstructive Pulmonary Disease (COPD), Osteoarthritis] [2:N/A] [N/A:N/A] Date Acquired: [1:09/06/2016] [2:09/13/2016] [N/A:N/A] Weeks of Treatment: [1:2] [2:2] [N/A:N/A] Wound Status: [1:Open] [2:Healed - Epithelialized] [N/A:N/A] Measurements L x W x D 0.7x0.6x0.1 [2:0x0x0] [N/A:N/A] (cm) Area (cm) : [1:0.33] [2:0] [N/A:N/A] Volume (cm) : [1:0.033] [2:0] [N/A:N/A] % Reduction in Area: [1:85.00%] [2:100.00%] [N/A:N/A] % Reduction in Volume: 85.00% [2:100.00%] [N/A:N/A] Classification: [1:Full Thickness  Without Exposed Support Structures] [2:Full Thickness Without Exposed Support Structures] [N/A:N/A] Exudate Amount: [1:Medium] [2:N/A] [N/A:N/A] Exudate Type: [1:Serosanguineous] [2:N/A] [N/A:N/A] Exudate Color: [1:red, brown] [2:N/A] [N/A:N/A] Wound Margin: [1:Flat and Intact] [2:N/A] [N/A:N/A] Granulation Amount: [1:Large (67-100%)] [2:N/A] [N/A:N/A] Granulation Quality: [1:Red, Pink] [2:N/A] [N/A:N/A] Necrotic Amount: [1:Small (1-33%)] [2:N/A] [N/A:N/A] Exposed Structures: [1:Fat: Yes Fascia: No Tendon: No] [2:N/A] [N/A:N/A] Muscle: No Joint: No Bone: No Epithelialization: None N/A N/A Periwound Skin Texture: Edema: No No Abnormalities Noted N/A Excoriation: No Induration: No Callus: No Crepitus: No Fluctuance: No Friable: No Rash: No Scarring: No Periwound Skin Moist: Yes No Abnormalities Noted N/A Moisture: Maceration: No Dry/Scaly: No Periwound Skin Color: Erythema: Yes No Abnormalities Noted  N/A Atrophie Blanche: No Cyanosis: No Ecchymosis: No Hemosiderin Staining: No Mottled: No Pallor: No Rubor: No Erythema Location: Circumferential N/A N/A Temperature: No Abnormality N/A N/A Tenderness on Yes No N/A Palpation: Wound Preparation: Ulcer Cleansing: N/A N/A Rinsed/Irrigated with Saline Topical Anesthetic Applied: Other: liociane 4% Treatment Notes Wound #1 (Left Lower Leg) 1. Cleansed with: Clean wound with Normal Saline 2. Anesthetic Topical Lidocaine 4% cream to wound bed prior to debridement 4. Dressing Applied: Prisma Ag 5. Secondary Dressing Applied Dry Gauze [address], [place]. ([id number]) Electronic Signature(s) Signed: 10/20/2016 11:23:22 AM By: Evlyn Kanner MD, FACS Entered By: Evlyn Kanner on 10/20/2016 11:23:21 DORSIE, SETHI ([id number]) -------------------------------------------------------------------------------- Multi-Disciplinary Care Plan Details Patient Name: [name] Date of Service: 10/20/2016 10:45 AM Medical Record Number: [id number] Patient Account Number: [id number] Date of Birth/Sex: [birth year] (71 y.o. Female) Treating RN: Ashok Cordia, Debi Primary Care Physician: Gabriel Cirri Other Clinician: Referring Physician: Gabriel Cirri Treating Physician/Extender: Rudene Re in Treatment: 2 Active Inactive Orientation to the Wound Care Program Nursing Diagnoses: Knowledge deficit related to the wound healing center program Goals: Patient/caregiver will verbalize understanding of the Wound Healing Center Program Date Initiated: 10/02/2016 Goal Status: Active Interventions: Provide education on orientation to the wound center Notes: Pain, Acute or Chronic Nursing Diagnoses: Pain Management - Non-cyclic Acute (Procedural) Goals: Patient/caregiver will verbalize comfort level met Date Initiated: 10/02/2016 Goal Status: Active Interventions: Encourage patient to take pain medications as  prescribed Implement pain control techniques (non-pharmaceutical) Reposition patient for comfort Treatment Activities: Administer pain control measures as ordered : 10/02/2016 Notes: Wound/Skin Impairment Nursing Diagnoses: SAYDIE, GERDTS ([id number]) Impaired tissue integrity Goals: Patient/caregiver will verbalize understanding of skin care regimen Date Initiated: 10/02/2016 Goal Status: Active Interventions: Assess patient/caregiver ability to obtain necessary supplies Notes: Electronic Signature(s) Signed: 10/20/2016 3:33:30 PM By: Alejandro Mulling Entered By: Alejandro Mulling on 10/20/2016 11:15:13 [name] ([id number]) -------------------------------------------------------------------------------- Pain Assessment Details Patient Name: [name] Date of Service: 10/20/2016 10:45 AM Medical Record Number: [id number] Patient Account Number: [id number] Date of Birth/Sex: 1945-11-07 (70 y.o. Female) Treating RN: Ashok Cordia, Debi Primary Care Physician: Gabriel Cirri Other Clinician: Referring Physician: Gabriel Cirri Treating Physician/Extender: Rudene Re in Treatment: 2 Active Problems Location of Pain Severity and Description of Pain Patient Has Paino No Site Locations With Dressing Change: No Pain Management and Medication Current Pain Management: Electronic Signature(s) Signed: 10/20/2016 3:33:30 PM By: Alejandro Mulling Entered By: Alejandro Mulling on 10/20/2016 11:00:59 [name] ([id number]) -------------------------------------------------------------------------------- Patient/Caregiver Education Details Patient Name: [name] Date of Service: 10/20/2016 10:45 AM Medical Record Number: [id number] Patient Account Number: [id number] Date of Birth/Gender: 11-22-1945 (71 y.o. Female) Treating RN: Ashok Cordia, Debi Primary Care Physician: Gabriel Cirri Other Clinician: Referring Physician: Gabriel Cirri Treating Physician/Extender:  Rudene Re in Treatment: 2 Education Assessment Education  Provided To: Patient Education Topics Provided Wound/Skin Impairment: Handouts: Other: change dressing as ordered Methods: Demonstration, Explain/Verbal Responses: State content correctly Electronic Signature(s) Signed: 10/20/2016 3:33:30 PM By: Alric Quan Entered By: Alric Quan on 10/20/2016 11:19:48 [name] ([id number]) -------------------------------------------------------------------------------- Wound Assessment Details Patient Name: [name] Date of Service: 10/20/2016 10:45 AM Medical Record Number: [id number] Patient Account Number: [id number] Date of Birth/Sex: 04-11-1946 (71 y.o. Female) Treating RN: Carolyne Fiscal, Debi Primary Care Physician: Kathrine Haddock Other Clinician: Referring Physician: Kathrine Haddock Treating Physician/Extender: Frann Rider in Treatment: 2 Wound Status Wound Number: 1 Primary Trauma, Other Etiology: Wound Location: Left Lower Leg Wound Open Wounding Event: Trauma Status: Date Acquired: 09/06/2016 Comorbid Asthma, Chronic Obstructive Weeks Of Treatment: 2 History: Pulmonary Disease (COPD), Clustered Wound: No Osteoarthritis Photos Photo Uploaded By: Alric Quan on 10/20/2016 15:20:08 Wound Measurements Length: (cm) 0.7 Width: (cm) 0.6 Depth: (cm) 0.1 Area: (cm) 0.33 Volume: (cm) 0.033 % Reduction in Area: 85% % Reduction in Volume: 85% Epithelialization: None Tunneling: No Undermining: No Wound Description Full Thickness Without Exposed Classification: Support Structures Wound Margin: Flat and Intact Exudate Medium Amount: Exudate Type: Serosanguineous Exudate Color: red, brown Foul Odor After Cleansing: No Wound Bed Granulation Amount: Large (67-100%) Exposed Structure Granulation Quality: Red, Pink Fascia Exposed: No Gargis, [place] H. ([id number]) Necrotic Amount: Small (1-33%) Fat Layer Exposed: Yes Necrotic  Quality: Adherent Slough Tendon Exposed: No Muscle Exposed: No Joint Exposed: No Bone Exposed: No Periwound Skin Texture Texture Color No Abnormalities Noted: No No Abnormalities Noted: No Callus: No Atrophie Blanche: No Crepitus: No Cyanosis: No Excoriation: No Ecchymosis: No Fluctuance: No Erythema: Yes Friable: No Erythema Location: Circumferential Induration: No Hemosiderin Staining: No Localized Edema: No Mottled: No Rash: No Pallor: No Scarring: No Rubor: No Moisture Temperature / Pain No Abnormalities Noted: No Temperature: No Abnormality Dry / Scaly: No Tenderness on Palpation: Yes Maceration: No Moist: Yes Wound Preparation Ulcer Cleansing: Rinsed/Irrigated with Saline Topical Anesthetic Applied: Other: liociane 4%, Treatment Notes Wound #1 (Left Lower Leg) 1. Cleansed with: Clean wound with Normal Saline 2. Anesthetic Topical Lidocaine 4% cream to wound bed prior to debridement 4. Dressing Applied: Prisma Ag 5. Secondary Dressing Applied Dry [employer] Signature(s) Signed: 10/20/2016 3:33:30 PM By: Alric Quan Entered By: Alric Quan on 10/20/2016 11:09:31 MATTIA, OSTERMAN ([id number]) -------------------------------------------------------------------------------- Wound Assessment Details Patient Name: [name] Date of Service: 10/20/2016 10:45 AM Medical Record Number: [id number] Patient Account Number: [id number] Date of Birth/Sex: 1946-08-20 (71 y.o. Female) Treating RN: Carolyne Fiscal, Debi Primary Care Physician: Kathrine Haddock Other Clinician: Referring Physician: Kathrine Haddock Treating Physician/Extender: Frann Rider in Treatment: 2 Wound Status Wound Number: 2 Primary Etiology: Trauma, Other Wound Location: Right Lower Leg Wound Status: Healed - Epithelialized Wounding Event: Trauma Date Acquired: 09/13/2016 Weeks Of Treatment: 2 Clustered Wound: No Photos Photo Uploaded By: Alric Quan  on 10/20/2016 15:20:33 Wound Measurements Length: (cm) 0 % Reducti Width: (cm) 0 % Reducti Depth: (cm) 0 Area: (cm) 0 Volume: (cm) 0 on in Area: 100% on in Volume: 100% Wound Description Full Thickness Without Exposed Classification: Support Structures Periwound Skin Texture Texture Color No Abnormalities Noted: No No Abnormalities Noted: No Moisture No Abnormalities Noted: No Electronic Signature(s) Signed: 10/20/2016 3:33:30 PM By: Mertie Clause, Chauncey Cruel ([id number]) Entered By: Alric Quan on 10/20/2016 11:14:07 [name] ([id number]) -------------------------------------------------------------------------------- Vitals Details Patient Name: [name] Date of Service: 10/20/2016 10:45 AM Medical Record Number: [id number] Patient Account Number: [id number] Date of Birth/Sex: [birth year] (70 y.o.  Female) Treating RN: Carolyne Fiscal, Debi Primary Care Physician: Kathrine Haddock Other Clinician: Referring Physician: Kathrine Haddock Treating Physician/Extender: Frann Rider in Treatment: 2 Vital Signs Time Taken: 11:01 Temperature (F): 97.8 Height (in): 68 Pulse (bpm): 77 Weight (lbs): 174.5 Respiratory Rate (breaths/min): 18 Body Mass Index (BMI): 26.5 Blood Pressure (mmHg): 112/56 Reference Range: 80 - 120 mg / dl Electronic Signature(s) Signed: 10/20/2016 3:33:30 PM By: Alric Quan Entered By: Alric Quan on 10/20/2016 11:02:54

## 2016-10-20 NOTE — Progress Notes (Signed)
KOBEE, SIKKEMA ([id number]) Visit Report for 10/20/2016 Chief Complaint Document Details Patient Name: Sharon [name], Sharon [name] Date of Service: 10/20/2016 10:45 AM Medical Record Number: [id number] Patient Account Number: [id number] Date of Birth/Sex: [birth year] (71 y.o. Female) Treating RN: Montey Hora Primary Care Physician: Kathrine Haddock Other Clinician: Referring Physician: Kathrine Haddock Treating Physician/Extender: Frann Rider in Treatment: 2 Information Obtained from: Patient Chief Complaint Mrs. Tay presents today for evaluation of bilateral lower extremity traumatic ulcerations Electronic Signature(s) Signed: 10/20/2016 11:23:28 AM By: Christin Fudge MD, FACS Entered By: Christin Fudge on 10/20/2016 11:23:28 ILAINA, BOULER ([id number]) -------------------------------------------------------------------------------- HPI Details Patient Name: Sharon [name] Date of Service: 10/20/2016 10:45 AM Medical Record Number: [id number] Patient Account Number: [id number] Date of Birth/Sex: Feb 20, 1946 (71 y.o. Female) Treating RN: Montey Hora Primary Care Physician: Kathrine Haddock Other Clinician: Referring Physician: Kathrine Haddock Treating Physician/Extender: Frann Rider in Treatment: 2 History of Present Illness Location: left lower extremity and right lower extremity Quality: Patient reports experiencing a sharp pain to left leg ulcer more so than right Severity: minimal at rest, 7-8 with manipulation Duration: pain is intermittent Context: both wounds are results of traumatic injury HPI Description: 10/02/16 - Mrs. Comiskey presents today for evaluation of a left lower extremity ulceration that has been present since Thanksgiving (2017). She states that this is a traumatic injury against a cardboard box. She originally presented to [facility], saw Merrie Roof, [place], on 11/27 at which time she was prescribed Bactrim and to continue with triple antibiotic  ointment. She returned on 12/4 at which time a culture was obtained and she was prescribed a two-week course of Augmentin. Culture results were no gross. She again returned to that office on 12/18 with concerns of infection secondary to redness and at that time she was prescribed penicillin 250 mg which she is currently taking. In the interim she did abrade her right lateral leg on a vacuum cleaner on 09/19/2016. She has continued to apply triple antibiotic ointment, covered with a Band-Aid to both wounds. She denies any systemic effects of fever, chills, or overall ill feeling. She does carry a history of, but not limited to, asthma, COPD, anxiety, osteoporosis, cervical cancer, TMJ, postmenopausal, pneumonia, arthritis, abdominal hysterectomy, tonsillectomy and adenoidectomy, bladder Botox, shoulder arthroscopy with rotator cuff repair, bladder suspension. She is not on any systemic anti- inflammatory or immunosuppressive therapy. She admits to smoking less than a quarter pack a day off and on over the last 50 years and quit September 2017. She denies any vascular intervention to either leg. 10/09/2016 -- her arterial studies are still pending. She does have a setup for varicose veins from her clinical examination but we will not do this study as yet. If the wound is stalled and not proceeding well with healing we may need to do a venous duplex study for reflux Electronic Signature(s) Signed: 10/20/2016 11:23:34 AM By: Christin Fudge MD, FACS Entered By: Christin Fudge on 10/20/2016 11:23:33 Sharon [name] ([id number]) -------------------------------------------------------------------------------- Physical Exam Details Patient Name: Sharon [name] Date of Service: 10/20/2016 10:45 AM Medical Record Number: [id number] Patient Account Number: [id number] Date of Birth/Sex: 1946-09-12 (70 y.o. Female) Treating RN: Montey Hora Primary Care Physician: Kathrine Haddock Other Clinician: Referring  Physician: Kathrine Haddock Treating Physician/Extender: Frann Rider in Treatment: 2 Constitutional . Pulse regular. Respirations normal and unlabored. Afebrile. . Eyes Nonicteric. Reactive to light. Ears, Nose, Mouth, and Throat Lips, teeth, and gums WNL.Marland Kitchen Moist mucosa without lesions. Neck supple and nontender. No palpable supraclavicular  or cervical adenopathy. Normal sized without goiter. Respiratory WNL. No retractions.. Cardiovascular Pedal Pulses WNL. No clubbing, cyanosis or edema. Chest Breasts symmetical and no nipple discharge.. Breast tissue WNL, no masses, lumps, or tenderness.. Lymphatic No adneopathy. No adenopathy. No adenopathy. Musculoskeletal Adexa without tenderness or enlargement.. Digits and nails w/o clubbing, cyanosis, infection, petechiae, ischemia, or inflammatory conditions.. Integumentary (Hair, Skin) No suspicious lesions. No crepitus or fluctuance. No peri-wound warmth or erythema. No masses.Marland Kitchen Psychiatric Judgement and insight Intact.. No evidence of depression, anxiety, or agitation.. Notes the right lower extremity is completely healed. The wound on the left lower extremity has superficial eschar and no sharp debridement was required today. The eschar was washed out with moist saline gauze and healthy granulation tissue is seen below this. Electronic Signature(s) Signed: 10/20/2016 11:24:40 AM By: Christin Fudge MD, FACS Entered By: Christin Fudge on 10/20/2016 11:24:40 Sharon [name] ([id number]) -------------------------------------------------------------------------------- Physician Orders Details Patient Name: Sharon [name] Date of Service: 10/20/2016 10:45 AM Medical Record Number: [id number] Patient Account Number: [id number] Date of Birth/Sex: January 21, 1946 (71 y.o. Female) Treating RN: Carolyne Fiscal, Debi Primary Care Physician: Kathrine Haddock Other Clinician: Referring Physician: Kathrine Haddock Treating Physician/Extender: Frann Rider in Treatment: 2 Verbal / Phone Orders: Yes Clinician: Pinkerton, Debi Read Back and Verified: Yes Diagnosis Coding Wound Cleansing Wound #1 Left Lower Leg o Clean wound with wound cleanser. Anesthetic Wound #1 Left Lower Leg o Topical Lidocaine 4% cream applied to wound bed prior to debridement Primary Wound Dressing Wound #1 Left Lower Leg o Prisma Ag - moisten with saline Secondary Dressing Wound #1 Left Lower Leg o Dry Gauze o Other - telfa isand Dressing Change Frequency Wound #1 Left Lower Leg o Change dressing every other day. Follow-up Appointments Wound #1 Left Lower Leg o Return Appointment in 1 week. Additional Orders / Instructions Wound #1 Left Lower Leg o Increase protein intake. Medications-please add to medication list. Wound #1 Left Lower Leg o P.O. Antibiotics - continue antibiotics AMIKA, REPPUCCI ([id number]) Electronic Signature(s) Signed: 10/20/2016 2:05:36 PM By: Christin Fudge MD, FACS Signed: 10/20/2016 3:33:30 PM By: Alric Quan Entered By: Alric Quan on 10/20/2016 11:18:18 SARAHI, OKERSON ([id number]) -------------------------------------------------------------------------------- Problem List Details Patient Name: Sharon [name] Date of Service: 10/20/2016 10:45 AM Medical Record Number: [id number] Patient Account Number: [id number] Date of Birth/Sex: [birth year] (71 y.o. Female) Treating RN: Montey Hora Primary Care Physician: Kathrine Haddock Other Clinician: Referring Physician: Kathrine Haddock Treating Physician/Extender: Frann Rider in Treatment: 2 Active Problems ICD-10 Encounter Code Description Active Date Diagnosis L97.822 Non-pressure chronic ulcer of other part of left lower leg 10/03/2016 Yes with fat layer exposed S81.811A Laceration without foreign body, right lower leg, initial 10/03/2016 Yes encounter Inactive Problems Resolved Problems Electronic Signature(s) Signed: 10/20/2016  11:23:16 AM By: Christin Fudge MD, FACS Entered By: Christin Fudge on 10/20/2016 11:23:15 Sharon [name] ([id number]) -------------------------------------------------------------------------------- Progress Note Details Patient Name: Sharon [name] Date of Service: 10/20/2016 10:45 AM Medical Record Number: [id number] Patient Account Number: [id number] Date of Birth/Sex: 05-31-1946 (71 y.o. Female) Treating RN: Montey Hora Primary Care Physician: Kathrine Haddock Other Clinician: Referring Physician: Kathrine Haddock Treating Physician/Extender: Frann Rider in Treatment: 2 Subjective Chief Complaint Information obtained from Patient Mrs. Rogel presents today for evaluation of bilateral lower extremity traumatic ulcerations History of Present Illness (HPI) The following HPI elements were documented for the patient's wound: Location: left lower extremity and right lower extremity Quality: Patient reports experiencing a sharp pain to left leg ulcer more so than right  Severity: minimal at rest, 7-8 with manipulation Duration: pain is intermittent Context: both wounds are results of traumatic injury 10/02/16 - Mrs. Zamor presents today for evaluation of a left lower extremity ulceration that has been present since Thanksgiving (2017). She states that this is a traumatic injury against a cardboard box. She originally presented to [facility], saw Merrie Roof, [place], on 11/27 at which time she was prescribed Bactrim and to continue with triple antibiotic ointment. She returned on 12/4 at which time a culture was obtained and she was prescribed a two-week course of Augmentin. Culture results were no gross. She again returned to that office on 12/18 with concerns of infection secondary to redness and at that time she was prescribed penicillin 250 mg which she is currently taking. In the interim she did abrade her right lateral leg on a vacuum cleaner on 09/19/2016. She has  continued to apply triple antibiotic ointment, covered with a Band-Aid to both wounds. She denies any systemic effects of fever, chills, or overall ill feeling. She does carry a history of, but not limited to, asthma, COPD, anxiety, osteoporosis, cervical cancer, TMJ, postmenopausal, pneumonia, arthritis, abdominal hysterectomy, tonsillectomy and adenoidectomy, bladder Botox, shoulder arthroscopy with rotator cuff repair, bladder suspension. She is not on any systemic anti- inflammatory or immunosuppressive therapy. She admits to smoking less than a quarter pack a day off and on over the last 50 years and quit September 2017. She denies any vascular intervention to either leg. 10/09/2016 -- her arterial studies are still pending. She does have a setup for varicose veins from her clinical examination but we will not do this study as yet. If the wound is stalled and not proceeding well with healing we may need to do a venous duplex study for reflux Objective Matteucci, Osiris H. ([id number]) Constitutional Pulse regular. Respirations normal and unlabored. Afebrile. Vitals Time Taken: 11:01 AM, Height: 68 in, Weight: 174.5 lbs, BMI: 26.5, Temperature: 97.8 F, Pulse: 77 bpm, Respiratory Rate: 18 breaths/min, Blood Pressure: 112/56 mmHg. Eyes Nonicteric. Reactive to light. Ears, Nose, Mouth, and Throat Lips, teeth, and gums WNL.Marland Kitchen Moist mucosa without lesions. Neck supple and nontender. No palpable supraclavicular or cervical adenopathy. Normal sized without goiter. Respiratory WNL. No retractions.. Cardiovascular Pedal Pulses WNL. No clubbing, cyanosis or edema. Chest Breasts symmetical and no nipple discharge.. Breast tissue WNL, no masses, lumps, or tenderness.. Lymphatic No adneopathy. No adenopathy. No adenopathy. Musculoskeletal Adexa without tenderness or enlargement.. Digits and nails w/o clubbing, cyanosis, infection, petechiae, ischemia, or inflammatory  conditions.Marland Kitchen Psychiatric Judgement and insight Intact.. No evidence of depression, anxiety, or agitation.. General Notes: the right lower extremity is completely healed. The wound on the left lower extremity has superficial eschar and no sharp debridement was required today. The eschar was washed out with moist saline gauze and healthy granulation tissue is seen below this. Integumentary (Hair, Skin) No suspicious lesions. No crepitus or fluctuance. No peri-wound warmth or erythema. No masses.. Wound #1 status is Open. Original cause of wound was Trauma. The wound is located on the Left Lower Leg. The wound measures 0.7cm length x 0.6cm width x 0.1cm depth; 0.33cm^2 area and 0.033cm^3 volume. There is fat exposed. There is no tunneling or undermining noted. There is a medium amount of serosanguineous drainage noted. The wound margin is flat and intact. There is large (67-100%) red, pink granulation within the wound bed. There is a small (1-33%) amount of necrotic tissue within the wound bed including Adherent Slough. The periwound skin appearance exhibited:  Moist, Erythema. The periwound skin appearance did not exhibit: Callus, Crepitus, Excoriation, Fluctuance, Friable, Induration, Localized Hockenbury, Tessi H. ([id number]) Edema, Rash, Scarring, Dry/Scaly, Maceration, Atrophie Blanche, Cyanosis, Ecchymosis, Hemosiderin Staining, Mottled, Pallor, Rubor. The surrounding wound skin color is noted with erythema which is circumferential. Periwound temperature was noted as No Abnormality. The periwound has tenderness on palpation. Wound #2 status is Healed - Epithelialized. Original cause of wound was Trauma. The wound is located on the Right Lower Leg. The wound measures 0cm length x 0cm width x 0cm depth; 0cm^2 area and 0cm^3 volume. Assessment Active Problems ICD-10 L97.822 - Non-pressure chronic ulcer of other part of left lower leg with fat layer exposed S81.811A - Laceration without foreign  body, right lower leg, initial encounter Plan Wound Cleansing: Wound #1 Left Lower Leg: Clean wound with wound cleanser. Anesthetic: Wound #1 Left Lower Leg: Topical Lidocaine 4% cream applied to wound bed prior to debridement Primary Wound Dressing: Wound #1 Left Lower Leg: Prisma Ag - moisten with saline Secondary Dressing: Wound #1 Left Lower Leg: Dry Gauze Other - telfa isand Dressing Change Frequency: Wound #1 Left Lower Leg: Change dressing every other day. Follow-up Appointments: Wound #1 Left Lower Leg: Return Appointment in 1 week. Additional Orders / Instructions: Wound #1 Left Lower Leg: Increase protein intake. Medications-please add to medication list.: VINIE, ZILINSKI. ([id number]) Wound #1 Left Lower Leg: P.O. Antibiotics - continue antibiotics I have recommended Prisma Ag with a bordered foam to be changed every other day. We have discussed appropriate dressing changes and have discussed the rational for her arterial and possible venous studies at a later date. She has had all questions answered. She will see [place] back next week. Electronic Signature(s) Signed: 10/20/2016 11:25:31 AM By: Christin Fudge MD, FACS Entered By: Christin Fudge on 10/20/2016 11:25:31 ESCARLETT, STEGE ([id number]) -------------------------------------------------------------------------------- SuperBill Details Patient Name: Sharon [name] Date of Service: 10/20/2016 Medical Record Number: [id number] Patient Account Number: [id number] Date of Birth/Sex: 23-Oct-1945 (71 y.o. Female) Treating RN: Montey Hora Primary Care Physician: Kathrine Haddock Other Clinician: Referring Physician: Kathrine Haddock Treating Physician/Extender: Frann Rider in Treatment: 2 Diagnosis Coding ICD-10 Codes Code Description [phone] Non-pressure chronic ulcer of other part of left lower leg with fat layer exposed S81.811A Laceration without foreign body, right lower leg, initial encounter Physician  Procedures CPT4: Description Modifier Quantity Code [id number] - WC PHYS LEVEL 3 - EST PT 1 ICD-10 Description Diagnosis L97.822 Non-pressure chronic ulcer of other part of left lower leg with fat layer exposed S81.811A Laceration without foreign body, right  lower leg, initial encounter Electronic Signature(s) Signed: 10/20/2016 11:25:41 AM By: Christin Fudge MD, FACS Entered By: Christin Fudge on 10/20/2016 11:25:40

## 2016-10-27 ENCOUNTER — Encounter: Payer: Medicare Other | Admitting: Surgery

## 2016-10-27 DIAGNOSIS — S81811A Laceration without foreign body, right lower leg, initial encounter: Secondary | ICD-10-CM | POA: Diagnosis not present

## 2016-10-27 DIAGNOSIS — M810 Age-related osteoporosis without current pathological fracture: Secondary | ICD-10-CM | POA: Diagnosis not present

## 2016-10-27 DIAGNOSIS — Z87891 Personal history of nicotine dependence: Secondary | ICD-10-CM | POA: Diagnosis not present

## 2016-10-27 DIAGNOSIS — J449 Chronic obstructive pulmonary disease, unspecified: Secondary | ICD-10-CM | POA: Diagnosis not present

## 2016-10-27 DIAGNOSIS — F419 Anxiety disorder, unspecified: Secondary | ICD-10-CM | POA: Diagnosis not present

## 2016-10-27 DIAGNOSIS — L97822 Non-pressure chronic ulcer of other part of left lower leg with fat layer exposed: Secondary | ICD-10-CM | POA: Diagnosis not present

## 2016-10-27 DIAGNOSIS — L97829 Non-pressure chronic ulcer of other part of left lower leg with unspecified severity: Secondary | ICD-10-CM | POA: Diagnosis not present

## 2016-10-27 NOTE — Progress Notes (Addendum)
Sharon, [name] ([id number]) Visit Report for 10/27/2016 Chief Complaint Document Details Patient Name: Sharon [name], Sharon [name] 10/27/2016 10:15 Date of Service: AM Medical Record [id number] Number: Patient Account Number: [id number] December 31, 1945 (71 y.o. Treating RN: Montey Hora Date of Birth/Sex: Female) Other Clinician: Primary Care Physician: Kathrine Haddock Treating Christin Fudge Referring Physician: Kathrine Haddock Physician/Extender: Suella Grove in Treatment: 3 Information Obtained from: Patient Chief Complaint Mrs. Hotchkiss presents today for evaluation of bilateral lower extremity traumatic ulcerations Electronic Signature(s) Signed: 10/27/2016 10:48:02 AM By: Christin Fudge MD, FACS Entered By: Christin Fudge on 10/27/2016 10:48:02 SIOMARA, CHARTON ([id number]) -------------------------------------------------------------------------------- HPI Details Patient Name: Sharon, SOCK 10/27/2016 10:15 Date of Service: AM Medical Record [id number] Number: Patient Account Number: [id number] 1945/11/05 (71 y.o. Treating RN: Montey Hora Date of Birth/Sex: Female) Other Clinician: Primary Care Physician: Kathrine Haddock Treating Charmion Hapke Referring Physician: Kathrine Haddock Physician/Extender: Weeks in Treatment: 3 History of Present Illness Location: left lower extremity and right lower extremity Quality: Patient reports experiencing a sharp pain to left leg ulcer more so than right Severity: minimal at rest, 7-8 with manipulation Duration: pain is intermittent Context: both wounds are results of traumatic injury HPI Description: 10/02/16 - Mrs. Cavasos presents today for evaluation of a left lower extremity ulceration that has been present since Thanksgiving (2017). She states that this is a traumatic injury against a cardboard box. She originally presented to [facility], saw Merrie Roof, [place], on 11/27 at which time she was prescribed Bactrim and to continue with triple  antibiotic ointment. She returned on 12/4 at which time a culture was obtained and she was prescribed a two-week course of Augmentin. Culture results were no gross. She again returned to that office on 12/18 with concerns of infection secondary to redness and at that time she was prescribed penicillin 250 mg which she is currently taking. In the interim she did abrade her right lateral leg on a vacuum cleaner on 09/19/2016. She has continued to apply triple antibiotic ointment, covered with a Band-Aid to both wounds. She denies any systemic effects of fever, chills, or overall ill feeling. She does carry a history of, but not limited to, asthma, COPD, anxiety, osteoporosis, cervical cancer, TMJ, postmenopausal, pneumonia, arthritis, abdominal hysterectomy, tonsillectomy and adenoidectomy, bladder Botox, shoulder arthroscopy with rotator cuff repair, bladder suspension. She is not on any systemic anti- inflammatory or immunosuppressive therapy. She admits to smoking less than a quarter pack a day off and on over the last 50 years and quit September 2017. She denies any vascular intervention to either leg. 10/09/2016 -- her arterial studies are still pending. She does have a setup for varicose veins from her clinical examination but we will not do this study as yet. If the wound is stalled and not proceeding well with healing we may need to do a venous duplex study for reflux. 10/27/2016 -- -- had a arterial duplex examination which showed a right ABI of 1.1 left ABI of 1.0 with a right TBI of 0.71 and a left ABI of 0.87 Electronic Signature(s) Signed: 10/27/2016 10:48:08 AM By: Christin Fudge MD, FACS Previous Signature: 10/27/2016 10:26:00 AM Version By: Christin Fudge MD, FACS Previous Signature: 10/27/2016 10:24:49 AM Version By: Christin Fudge MD, FACS Entered By: Christin Fudge on 10/27/2016 10:48:08 ADIBA, MONTANARO  ([id number]) -------------------------------------------------------------------------------- Physical Exam Details Patient Name: Sharon, [name] 10/27/2016 10:15 Date of Service: AM Medical Record [id number] Number: Patient Account Number: [id number] 10-29-1945 (71 y.o. Treating RN: Montey Hora Date of Birth/Sex: Female) Other Clinician:  Primary Care Physician: Kathrine Haddock Treating Christin Fudge Referring Physician: Kathrine Haddock Physician/Extender: Suella Grove in Treatment: 3 Constitutional . Pulse regular. Respirations normal and unlabored. Afebrile. . Eyes Nonicteric. Reactive to light. Ears, Nose, Mouth, and Throat Lips, teeth, and gums WNL.Marland Kitchen Moist mucosa without lesions. Neck supple and nontender. No palpable supraclavicular or cervical adenopathy. Normal sized without goiter. Respiratory WNL. No retractions.. Breath sounds WNL, No rubs, rales, rhonchi, or wheeze.. Cardiovascular Heart rhythm and rate regular, no murmur or gallop.. Pedal Pulses WNL. No clubbing, cyanosis or edema. Chest Breasts symmetical and no nipple discharge.. Breast tissue WNL, no masses, lumps, or tenderness.. Lymphatic No adneopathy. No adenopathy. No adenopathy. Musculoskeletal Adexa without tenderness or enlargement.. Digits and nails w/o clubbing, cyanosis, infection, petechiae, ischemia, or inflammatory conditions.. Integumentary (Hair, Skin) No suspicious lesions. No crepitus or fluctuance. No peri-wound warmth or erythema. No masses.Marland Kitchen Psychiatric Judgement and insight Intact.. No evidence of depression, anxiety, or agitation.. Notes the wound of the left lower extremity is also completely healed and she has supple scar tissue. Electronic Signature(s) Signed: 10/27/2016 10:48:32 AM By: Christin Fudge MD, FACS Entered By: Christin Fudge on 10/27/2016 10:48:31 [name], [name] ([id number]) -------------------------------------------------------------------------------- Physician Orders  Details Patient Name: Sharon, [name] 10/27/2016 10:15 Date of Service: AM Medical Record [id number] Number: Patient Account Number: [id number] 01/03/1946 (71 y.o. Treating RN: Montey Hora Date of Birth/Sex: Female) Other Clinician: Primary Care Physician: Kathrine Haddock Treating Christin Fudge Referring Physician: Kathrine Haddock Physician/Extender: Suella Grove in Treatment: 3 Verbal / Phone Orders: Yes Clinician: Montey Hora Read Back and Verified: Yes Diagnosis Coding Discharge From [facility] Services o Discharge from [facility] Signature(s) Signed: 10/27/2016 3:40:08 PM By: Christin Fudge MD, FACS Signed: 10/27/2016 3:52:38 PM By: Montey Hora Entered By: Montey Hora on 10/27/2016 10:31:29 ZYION, REMER ([id number]) -------------------------------------------------------------------------------- Problem List Details Patient Name: ROSARY, MANWELL 10/27/2016 10:15 Date of Service: AM Medical Record [id number] Number: Patient Account Number: [id number] Sep 17, 1946 (71 y.o. 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 10/27/2016 10:15 Date of Service: AM Medical Record [id number] Number: Patient  Account Number: [id number] 17-Feb-1946 (71 y.o. Treating RN: Montey Hora Date of Birth/Sex: Female) Other Clinician: Primary Care Physician: Kathrine Haddock Treating Christin Fudge Referring Physician: Kathrine Haddock Physician/Extender: Suella Grove in Treatment: 3 Subjective Chief Complaint Information obtained from Patient Mrs. Ramberg presents today for evaluation of bilateral lower extremity traumatic ulcerations History of Present Illness (HPI) The following HPI elements were documented for the patient's wound: Location: left lower extremity and right lower extremity Quality: Patient reports experiencing a sharp pain to left leg ulcer more so than right Severity: minimal at rest, 7-8 with manipulation Duration: pain is intermittent Context: both wounds are results of traumatic injury 10/02/16 - Mrs. Houg presents today for evaluation of a left lower extremity ulceration that has been present since Thanksgiving (2017). She states that this is a traumatic injury against a cardboard box. She originally presented to [facility], saw Merrie Roof, PA-C, on  11/27 at which time she was prescribed Bactrim and to continue with triple antibiotic ointment. She returned on 12/4 at which time a culture was obtained and she was prescribed a two-week course of Augmentin. Culture results were no gross. She again returned to that office on 12/18 with concerns of infection secondary to redness and at that time she was prescribed penicillin 250 mg which she is currently taking. In the interim she did abrade her right lateral leg on a vacuum cleaner on 09/19/2016. She has continued to apply triple antibiotic ointment, covered with a Band-Aid to both wounds. She denies any systemic effects of fever, chills, or overall ill feeling. She does carry a history of, but not limited to, asthma, COPD, anxiety, osteoporosis, cervical cancer, TMJ, postmenopausal, pneumonia, arthritis, abdominal hysterectomy,  tonsillectomy and adenoidectomy, bladder Botox, shoulder arthroscopy with rotator cuff repair, bladder suspension. She is not on any systemic anti- inflammatory or immunosuppressive therapy. She admits to smoking less than a quarter pack a day off and on over the last 50 years and quit September 2017. She denies any vascular intervention to either leg. 10/09/2016 -- her arterial studies are still pending. She does have a setup for varicose veins from her clinical examination but we will not do this study as yet. If the wound is stalled and not proceeding well with healing we may need to do a venous duplex study for reflux. 10/27/2016 -- -- had a arterial duplex examination which showed a right ABI of 1.1 left ABI of 1.0 with a right TBI of 0.71 and a left ABI of 0.87 Berens, Graciemae H. ([id number]) Objective Constitutional Pulse regular. Respirations normal and unlabored. Afebrile. Vitals Time Taken: 10:15 AM, Height: 68 in, Weight: 174.5 lbs, BMI: 26.5, Temperature: 98.0 F, Pulse: 74 bpm, Respiratory Rate: 16 breaths/min, Blood Pressure: 122/50 mmHg. Eyes Nonicteric. Reactive to light. Ears, Nose, Mouth, and Throat Lips, teeth, and gums WNL.Marland Kitchen Moist mucosa without lesions. Neck supple and nontender. No palpable supraclavicular or cervical adenopathy. Normal sized without goiter. Respiratory WNL. No retractions.. Breath sounds WNL, No rubs, rales, rhonchi, or wheeze.. Cardiovascular Heart rhythm and rate regular, no murmur or gallop.. Pedal Pulses WNL. No clubbing, cyanosis or edema. Chest Breasts symmetical and no nipple discharge.. Breast tissue WNL, no masses, lumps, or tenderness.. Lymphatic No adneopathy. No adenopathy. No adenopathy. Musculoskeletal Adexa without tenderness or enlargement.. Digits and nails w/o clubbing, cyanosis, infection, petechiae, ischemia, or inflammatory conditions.Marland Kitchen Psychiatric Judgement and insight Intact.. No evidence of depression, anxiety, or  agitation.. General Notes: the wound of the left lower extremity is also completely healed and she has supple scar tissue. Integumentary (Hair, Skin) No suspicious lesions. No crepitus or fluctuance. No peri-wound warmth or erythema. No masses.. Wound #1 status is Healed - Epithelialized. Original cause of wound was Trauma. The wound is located on the Left Lower Leg. The wound measures 0cm length x 0cm width x 0cm depth; 0cm^2 area and 0cm^3 volume. There is fat exposed. There is a none present amount of drainage noted. The wound margin is flat Gruen, Shakeeta H. ([id number]) and intact. There is no granulation within the wound bed. There is no necrotic tissue within the wound bed. The periwound skin appearance exhibited: Moist, Erythema. The periwound skin appearance did not exhibit: Callus, Crepitus, Excoriation, Fluctuance, Friable, Induration, Localized Edema, Rash, Scarring, Dry/Scaly, Maceration, Atrophie Blanche, Cyanosis, Ecchymosis, Hemosiderin Staining, Mottled, Pallor, Rubor. The surrounding wound skin color is noted with erythema which is circumferential. Periwound temperature was noted as No Abnormality. The  periwound has tenderness on palpation. Assessment Active Problems ICD-10 L97.822 - Non-pressure chronic ulcer of other part of left lower leg with fat layer exposed S81.811A - Laceration without foreign body, right lower leg, initial encounter Plan Discharge From [facility] Services: Discharge from [facility] The wound having completely healed, I have asked her to support this with some foam border and continue to wear her compression. Now that she has normal arterial studies, compressing her legs with 20-30 mm compression will be no problem. She is discharged from the wound care services and will be seen back as needed Electronic Signature(s) Signed: 10/27/2016 10:49:44 AM By: Christin Fudge MD, FACS Entered By: Christin Fudge on 10/27/2016 10:49:44 [name]  ([id number]) -------------------------------------------------------------------------------- SuperBill Details Patient Name: [name] Date of Service: 10/27/2016 Medical Record Number: [id number] Patient Account Number: [id number] Date of Birth/Sex: Oct 18, 1945 (70 y.o. Female) Treating RN: Montey Hora Primary Care Physician: Kathrine Haddock Other Clinician: Referring Physician: Kathrine Haddock Treating Physician/Extender: Frann Rider in Treatment: 3 Diagnosis Coding ICD-10 Codes Code Description [phone] Non-pressure chronic ulcer of other part of left lower leg with fat layer exposed S81.811A Laceration without foreign body, right lower leg, initial encounter Facility Procedures CPT4 Code: [id number] Description: [phone] - WOUND CARE VISIT-LEV 2 EST PT Modifier: Quantity: 1 Physician Procedures CPT4: Description Modifier Quantity Code [id number] - WC PHYS LEVEL 3 - EST PT 1 ICD-10 Description Diagnosis L97.822 Non-pressure chronic ulcer of other part of left lower leg with fat layer exposed S81.811A Laceration without foreign body, right  lower leg, initial encounter Electronic Signature(s) Signed: 10/27/2016 10:49:58 AM By: Christin Fudge MD, FACS Entered By: Christin Fudge on 10/27/2016 10:49:58

## 2016-10-27 NOTE — Progress Notes (Signed)
Sharon, [name] ([id number]) Visit Report for 10/27/2016 Arrival Information Details Patient Name: Sharon [name], Sharon [name] Date of Service: 10/27/2016 10:15 AM Medical Record Number: [id number] Patient Account Number: [id number] Date of Birth/Sex: Feb 23, 1946 (71 y.o. Female) Treating RN: Montey Hora Primary Care Physician: Kathrine Haddock Other Clinician: Referring Physician: Kathrine Haddock Treating Physician/Extender: Frann Rider in Treatment: 3 Visit Information History Since Last Visit Added or deleted any medications: No Patient Arrived: Ambulatory Any new allergies or adverse reactions: No Arrival Time: 10:14 Had a fall or experienced change in No Accompanied By: self activities of daily living that may affect Transfer Assistance: None risk of falls: Patient Identification Verified: Yes Signs or symptoms of abuse/neglect since last No Secondary Verification Process Yes visito Completed: Hospitalized since last visit: No Patient Requires Transmission-Based No Pain Present Now: Yes Precautions: Patient Has Alerts: No Electronic Signature(s) Signed: 10/27/2016 3:52:38 PM By: Montey Hora Entered By: Montey Hora on 10/27/2016 10:14:52 Sharon [name] ([id number]) -------------------------------------------------------------------------------- Clinic Level of Care Assessment Details Patient Name: Sharon [name] Date of Service: 10/27/2016 10:15 AM Medical Record Number: [id number] Patient Account Number: [id number] Date of Birth/Sex: 22-May-1946 (71 y.o. Female) Treating RN: Montey Hora Primary Care Physician: Kathrine Haddock Other Clinician: Referring Physician: Kathrine Haddock Treating Physician/Extender: Frann Rider in Treatment: 3 Clinic Level of Care Assessment Items TOOL 4 Quantity Score []  - Use when only an EandM is performed on FOLLOW-UP visit 0 ASSESSMENTS - Nursing Assessment / Reassessment X - Reassessment of Co-morbidities (includes updates in  patient status) 1 10 X - Reassessment of Adherence to Treatment Plan 1 5 ASSESSMENTS - Wound and Skin Assessment / Reassessment X - Simple Wound Assessment / Reassessment - one wound 1 5 []  - Complex Wound Assessment / Reassessment - multiple wounds 0 []  - Dermatologic / Skin Assessment (not related to wound area) 0 ASSESSMENTS - Focused Assessment []  - Circumferential Edema Measurements - multi extremities 0 []  - Nutritional Assessment / Counseling / Intervention 0 X - Lower Extremity Assessment (monofilament, tuning fork, pulses) 1 5 []  - Peripheral Arterial Disease Assessment (using hand held doppler) 0 ASSESSMENTS - Ostomy and/or Continence Assessment and Care []  - Incontinence Assessment and Management 0 []  - Ostomy Care Assessment and Management (repouching, etc.) 0 PROCESS - Coordination of Care X - Simple Patient / Family Education for ongoing care 1 15 []  - Complex (extensive) Patient / Family Education for ongoing care 0 []  - Staff obtains Programmer, systems, Records, Test Results / Process Orders 0 []  - Staff telephones HHA, Nursing Homes / Clarify orders / etc 0 []  - Routine Transfer to another Facility (non-emergent condition) 0 Smaldone, Gianella H. ([id number]) []  - Routine Hospital Admission (non-emergent condition) 0 []  - New Admissions / Biomedical engineer / Ordering NPWT, Apligraf, etc. 0 []  - Emergency Hospital Admission (emergent condition) 0 X - Simple Discharge Coordination 1 10 []  - Complex (extensive) Discharge Coordination 0 PROCESS - Special Needs []  - Pediatric / Minor Patient Management 0 []  - Isolation Patient Management 0 []  - Hearing / Language / Visual special needs 0 []  - Assessment of Community assistance (transportation, D/C planning, etc.) 0 []  - Additional assistance / Altered mentation 0 []  - Support Surface(s) Assessment (bed, cushion, seat, etc.) 0 INTERVENTIONS - Wound Cleansing / Measurement X - Simple Wound Cleansing - one wound 1 5 []  - Complex  Wound Cleansing - multiple wounds 0 X - Wound Imaging (photographs - any number of wounds) 1 5 []  - Wound Tracing (instead of photographs) 0 X -  Simple Wound Measurement - one wound 1 5 []  - Complex Wound Measurement - multiple wounds 0 INTERVENTIONS - Wound Dressings []  - Small Wound Dressing one or multiple wounds 0 []  - Medium Wound Dressing one or multiple wounds 0 []  - Large Wound Dressing one or multiple wounds 0 []  - Application of Medications - topical 0 []  - Application of Medications - injection 0 INTERVENTIONS - Miscellaneous []  - External ear exam 0 Boxer, Annaleigha H. ([id number]) []  - Specimen Collection (cultures, biopsies, blood, body fluids, etc.) 0 []  - Specimen(s) / Culture(s) sent or taken to Lab for analysis 0 []  - Patient Transfer (multiple staff / Harrel Lemon Lift / Similar devices) 0 []  - Simple Staple / Suture removal (25 or less) 0 []  - Complex Staple / Suture removal (26 or more) 0 []  - Hypo / Hyperglycemic Management (close monitor of Blood Glucose) 0 []  - Ankle / Brachial Index (ABI) - do not check if billed separately 0 X - Vital Signs 1 5 Has the patient been seen at the hospital within the last three years: Yes Total Score: 70 Level Of Care: New/Established - Level 2 Electronic Signature(s) Signed: 10/27/2016 3:52:38 PM By: Montey Hora Entered By: Montey Hora on 10/27/2016 10:31:53 Sharon [name] ([id number]) -------------------------------------------------------------------------------- Encounter Discharge Information Details Patient Name: Sharon [name] Date of Service: 10/27/2016 10:15 AM Medical Record Number: [id number] Patient Account Number: [id number] Date of Birth/Sex: Jan 25, 1946 (71 y.o. Female) Treating RN: Montey Hora Primary Care Physician: Kathrine Haddock Other Clinician: Referring Physician: Kathrine Haddock Treating Physician/Extender: Frann Rider in Treatment: 3 Encounter Discharge Information Items Discharge Pain Level:  0 Discharge Condition: Stable Ambulatory Status: Ambulatory Discharge Destination: Home Transportation: Private Auto Accompanied By: self Schedule Follow-up Appointment: No Medication Reconciliation completed No and provided to Patient/Care Heydi Swango: Patient Clinical Summary of Care: Declined Electronic Signature(s) Signed: 10/27/2016 10:43:09 AM By: Sharon Mt Entered By: Sharon Mt on 10/27/2016 10:43:09 Sharon [name] ([id number]) -------------------------------------------------------------------------------- Lower Extremity Assessment Details Patient Name: Sharon [name] Date of Service: 10/27/2016 10:15 AM Medical Record Number: [id number] Patient Account Number: [id number] Date of Birth/Sex: 1946/10/11 (71 y.o. Female) Treating RN: Montey Hora Primary Care Physician: Kathrine Haddock Other Clinician: Referring Physician: Kathrine Haddock Treating Physician/Extender: Frann Rider in Treatment: 3 Edema Assessment Assessed: [Left: No] [Right: No] Edema: [Left: N] [Right: o] Vascular Assessment Pulses: Dorsalis Pedis Palpable: [Left:Yes] Posterior Tibial Extremity colors, hair growth, and conditions: Extremity Color: [Left:Normal] Hair Growth on Extremity: [Left:No] Temperature of Extremity: [Left:Warm] Capillary Refill: [Left:< 3 seconds] Electronic Signature(s) Signed: 10/27/2016 3:52:38 PM By: Montey Hora Entered By: Montey Hora on 10/27/2016 10:30:59 Sharon [name] ([id number]) -------------------------------------------------------------------------------- Multi Wound Chart Details Patient Name: Sharon [name] Date of Service: 10/27/2016 10:15 AM Medical Record Number: [id number] Patient Account Number: [id number] Date of Birth/Sex: Oct 24, 1945 (72 y.o. Female) Treating RN: Montey Hora Primary Care Physician: Kathrine Haddock Other Clinician: Referring Physician: Kathrine Haddock Treating Physician/Extender: Frann Rider in Treatment:  3 Vital Signs Height(in): 68 Pulse(bpm): 74 Weight(lbs): 174.5 Blood Pressure 122/50 (mmHg): Body Mass Index(BMI): 27 Temperature(F): 98.0 Respiratory Rate 16 (breaths/min): Photos: [N/A:N/A] Wound Location: Left Lower Leg N/A N/A Wounding Event: Trauma N/A N/A Primary Etiology: Trauma, Other N/A N/A Comorbid History: Asthma, Chronic N/A N/A Obstructive Pulmonary Disease (COPD), Osteoarthritis Date Acquired: 09/06/2016 N/A N/A Weeks of Treatment: 3 N/A N/A Wound Status: Healed - Epithelialized N/A N/A Measurements L x W x D 0x0x0 N/A N/A (cm) Area (cm) : 0 N/A N/A Volume (cm) : 0 N/A N/A %  Reduction in Area: 100.00% N/A N/A % Reduction in Volume: 100.00% N/A N/A Classification: Full Thickness Without N/A N/A Exposed Support Structures Exudate Amount: None Present N/A N/A Wound Margin: Flat and Intact N/A N/A Granulation Amount: None Present (0%) N/A N/A Necrotic Amount: None Present (0%) N/A N/A Lakatos, Abigail H. ([id number]) Exposed Structures: Fat: Yes N/A N/A Fascia: No Tendon: No Muscle: No Joint: No Bone: No Epithelialization: Large (67-100%) N/A N/A Periwound Skin Texture: Edema: No N/A N/A Excoriation: No Induration: No Callus: No Crepitus: No Fluctuance: No Friable: No Rash: No Scarring: No Periwound Skin Moist: Yes N/A N/A Moisture: Maceration: No Dry/Scaly: No Periwound Skin Color: Erythema: Yes N/A N/A Atrophie Blanche: No Cyanosis: No Ecchymosis: No Hemosiderin Staining: No Mottled: No Pallor: No Rubor: No Erythema Location: Circumferential N/A N/A Temperature: No Abnormality N/A N/A Tenderness on Yes N/A N/A Palpation: Wound Preparation: Ulcer Cleansing: N/A N/A Rinsed/Irrigated with Saline Topical Anesthetic Applied: None Treatment Notes Electronic Signature(s) Signed: 10/27/2016 10:47:56 AM By: Christin Fudge MD, FACS Entered By: Christin Fudge on 10/27/2016 10:47:56 Sharon [name]  ([id number]) -------------------------------------------------------------------------------- Multi-Disciplinary Care Plan Details Patient Name: Sharon [name] Date of Service: 10/27/2016 10:15 AM Medical Record Number: [id number] Patient Account Number: [id number] Date of Birth/Sex: September 17, 1946 (71 y.o. Female) Treating RN: Montey Hora Primary Care Physician: Kathrine Haddock Other Clinician: Referring Physician: Kathrine Haddock Treating Physician/Extender: Frann Rider in Treatment: 3 Active Inactive Electronic Signature(s) Signed: 10/27/2016 3:52:38 PM By: Montey Hora Entered By: Montey Hora on 10/27/2016 10:31:09 Sharon [name] ([id number]) -------------------------------------------------------------------------------- Pain Assessment Details Patient Name: Sharon [name] Date of Service: 10/27/2016 10:15 AM Medical Record Number: [id number] Patient Account Number: [id number] Date of Birth/Sex: 1946-03-08 (71 y.o. Female) Treating RN: Montey Hora Primary Care Physician: Kathrine Haddock Other Clinician: Referring Physician: Kathrine Haddock Treating Physician/Extender: Frann Rider in Treatment: 3 Active Problems Location of Pain Severity and Description of Pain Patient Has Paino Yes Site Locations Pain Location: Generalized Pain With Dressing Change: No Duration of the Pain. Constant / Intermittento Constant Pain Management and Medication Current Pain Management: Notes Topical or injectable lidocaine is offered to patient for acute pain when surgical debridement is performed. If needed, Patient is instructed to use over the counter pain medication for the following 24-48 hours after debridement. Wound care MDs do not prescribed pain medications. Patient has chronic pain or uncontrolled pain. Patient has been instructed to make an appointment with their Primary Care Physician for pain management. Electronic Signature(s) Signed: 10/27/2016 3:52:38 PM By:  Montey Hora Entered By: Montey Hora on 10/27/2016 10:15:10 Sharon [name] ([id number]) -------------------------------------------------------------------------------- Patient/Caregiver Education Details Patient Name: Sharon [name] Date of Service: 10/27/2016 10:15 AM Medical Record Number: [id number] Patient Account Number: [id number] Date of Birth/Gender: [birth year] (71 y.o. Female) Treating RN: Montey Hora Primary Care Physician: Kathrine Haddock Other Clinician: Referring Physician: Kathrine Haddock Treating Physician/Extender: Frann Rider in Treatment: 3 Education Assessment Education Provided To: Patient Education Topics Provided Basic Hygiene: Handouts: Other: care of newly healed ulcer site Methods: Explain/Verbal Responses: State content correctly Electronic Signature(s) Signed: 10/27/2016 3:52:38 PM By: Montey Hora Entered By: Montey Hora on 10/27/2016 10:32:28 Sharon [name] ([id number]) -------------------------------------------------------------------------------- Wound Assessment Details Patient Name: Sharon [name] Date of Service: 10/27/2016 10:15 AM Medical Record Number: [id number] Patient Account Number: [id number] Date of Birth/Sex: Apr 01, 1946 (71 y.o. Female) Treating RN: Montey Hora Primary Care Physician: Kathrine Haddock Other Clinician: Referring Physician: Kathrine Haddock Treating Physician/Extender: Frann Rider in Treatment: 3 Wound Status Wound Number: 1 Primary Trauma, Other  Etiology: Wound Location: Left Lower Leg Wound Healed - Epithelialized Wounding Event: Trauma Status: Date Acquired: 09/06/2016 Comorbid Asthma, Chronic Obstructive Weeks Of Treatment: 3 History: Pulmonary Disease (COPD), Clustered Wound: No Osteoarthritis Photos Wound Measurements Length: (cm) 0 Width: (cm) 0 Depth: (cm) 0 Area: (cm) 0 Volume: (cm) 0 % Reduction in Area: 100% % Reduction in Volume: 100% Epithelialization: Large  (67-100%) Wound Description Full Thickness Without Exposed Classification: Support Structures Wound Margin: Flat and Intact Exudate None Present Amount: Foul Odor After Cleansing: No Wound Bed Granulation Amount: None Present (0%) Exposed Structure Necrotic Amount: None Present (0%) Fascia Exposed: No Fat Layer Exposed: Yes Tendon Exposed: No Muscle Exposed: No Quinteros, Bambi H. ([id number]) Joint Exposed: No Bone Exposed: No Periwound Skin Texture Texture Color No Abnormalities Noted: No No Abnormalities Noted: No Callus: No Atrophie Blanche: No Crepitus: No Cyanosis: No Excoriation: No Ecchymosis: No Fluctuance: No Erythema: Yes Friable: No Erythema Location: Circumferential Induration: No Hemosiderin Staining: No Localized Edema: No Mottled: No Rash: No Pallor: No Scarring: No Rubor: No Moisture Temperature / Pain No Abnormalities Noted: No Temperature: No Abnormality Dry / Scaly: No Tenderness on Palpation: Yes Maceration: No Moist: Yes Wound Preparation Ulcer Cleansing: Rinsed/Irrigated with Saline Topical Anesthetic Applied: None Electronic Signature(s) Signed: 10/27/2016 3:52:38 PM By: Montey Hora Entered By: Montey Hora on 10/27/2016 10:30:37 Sharon [name] ([id number]) -------------------------------------------------------------------------------- Vitals Details Patient Name: Sharon [name] Date of Service: 10/27/2016 10:15 AM Medical Record Number: [id number] Patient Account Number: [id number] Date of Birth/Sex: 15-Aug-1946 (71 y.o. Female) Treating RN: Montey Hora Primary Care Physician: Kathrine Haddock Other Clinician: Referring Physician: Kathrine Haddock Treating Physician/Extender: Frann Rider in Treatment: 3 Vital Signs Time Taken: 10:15 Temperature (F): 98.0 Height (in): 68 Pulse (bpm): 74 Weight (lbs): 174.5 Respiratory Rate (breaths/min): 16 Body Mass Index (BMI): 26.5 Blood Pressure (mmHg): 122/50 Reference  Range: 80 - 120 mg / dl Electronic Signature(s) Signed: 10/27/2016 3:52:38 PM By: Montey Hora Entered By: Montey Hora on 10/27/2016 10:17:12

## 2016-11-07 ENCOUNTER — Telehealth: Payer: Self-pay | Admitting: Unknown Physician Specialty

## 2016-11-07 NOTE — Telephone Encounter (Signed)
Called and left patient a VM asking for her to please return my call.  

## 2016-11-10 NOTE — Telephone Encounter (Signed)
Patient returned my call. She stated that her right ear is really bothering her. She states she would like to go see whoever Malachy Mood recommends at ENT. Patient also states she is still not feeling well. She states that her throat is still bothering her, no taste, and phlegm. States she took all of the prednisone and doxycycline she was given the other week. States her mouthwash did not help either and she is really concerned about her mouth.

## 2016-11-10 NOTE — Telephone Encounter (Signed)
Called and a man answered the phone and said that the patient was not available right now. I asked for the patient to please return my call when she could and he stated that he would tell her.

## 2016-11-10 NOTE — Telephone Encounter (Signed)
I would like to see both her ear and mouth.  Thanks

## 2016-11-11 NOTE — Telephone Encounter (Signed)
Called and schedule the patient an appointment for 11/14/16.

## 2016-11-12 LAB — CULTURE, GROUP A STREP

## 2016-11-12 LAB — RAPID STREP SCREEN (MED CTR MEBANE ONLY): STREP GP A AG, IA W/REFLEX: NEGATIVE

## 2016-11-14 ENCOUNTER — Ambulatory Visit (INDEPENDENT_AMBULATORY_CARE_PROVIDER_SITE_OTHER): Payer: Medicare Other | Admitting: Cardiovascular Disease

## 2016-11-14 ENCOUNTER — Ambulatory Visit (INDEPENDENT_AMBULATORY_CARE_PROVIDER_SITE_OTHER): Payer: Medicare Other | Admitting: Unknown Physician Specialty

## 2016-11-14 ENCOUNTER — Other Ambulatory Visit: Payer: Self-pay | Admitting: Unknown Physician Specialty

## 2016-11-14 ENCOUNTER — Encounter: Payer: Self-pay | Admitting: Cardiovascular Disease

## 2016-11-14 ENCOUNTER — Encounter: Payer: Self-pay | Admitting: Unknown Physician Specialty

## 2016-11-14 VITALS — BP 131/73 | HR 74 | Temp 98.5°F | Wt 175.0 lb

## 2016-11-14 VITALS — BP 128/64 | HR 74 | Ht 69.0 in | Wt 175.0 lb

## 2016-11-14 DIAGNOSIS — S81809S Unspecified open wound, unspecified lower leg, sequela: Secondary | ICD-10-CM

## 2016-11-14 DIAGNOSIS — H9201 Otalgia, right ear: Secondary | ICD-10-CM | POA: Diagnosis not present

## 2016-11-14 DIAGNOSIS — R5383 Other fatigue: Secondary | ICD-10-CM | POA: Diagnosis not present

## 2016-11-14 DIAGNOSIS — J069 Acute upper respiratory infection, unspecified: Secondary | ICD-10-CM | POA: Diagnosis not present

## 2016-11-14 DIAGNOSIS — K140 Glossitis: Secondary | ICD-10-CM | POA: Diagnosis not present

## 2016-11-14 DIAGNOSIS — B9789 Other viral agents as the cause of diseases classified elsewhere: Secondary | ICD-10-CM

## 2016-11-14 MED ORDER — FLUCONAZOLE 100 MG PO TABS: 100.0000 mg | ORAL_TABLET | Freq: Every day | ORAL | 0 refills | Status: DC

## 2016-11-14 NOTE — Progress Notes (Signed)
Cardiology Office Note   Date:  11/14/2016   ID:  Ragena, Dovale 12/09/1945, MRN [id number]  PCP:  Kathrine Haddock, NP  Cardiologist:   Kathlyn Sacramento, MD   Chief Complaint  Patient presents with  . other    Ref by the wound care center for evaluation of PAD and follow up from Vas u/s of LE. Meds reviewed by the pt. verbally.       History of Present Illness: Sharon [name] is a 71 y.o. female who was referred by Dr. Con Memos for evaluation of possible peripheral arterial disease. She developed 2 wounds on the lower extremity . The first one was on the left shin after she hit a box. The other one was on the right ankle as the vacuum cleaner fell on her leg. These happened in November and they were slow to heal. They have healed completely now and she was discharged from the wound clinic. She was referred as she was noted to have diminished distal pulses and she is a previous smoker. She denies any claudication. No prior cardiac history and she denies any chest pain. She describes chronic exertional dyspnea which she attributes to prior smoking. She had an ABI done last month which was normal bilaterally with normal triphasic waveforms and normal toe pressures.    Past Medical History:  Diagnosis Date  . Anxiety   . Arthritis    hands, upper back  . Asthma   . COPD (chronic obstructive pulmonary disease) ([place])   . History of cervical cancer   . Menopausal disorder   . Osteoporosis   . Pneumonia 1960  . Spasm of abdominal muscles of right side    intermittent  . TMJ (dislocation of temporomandibular joint)   . Wears dentures    partial lower    Past Surgical History:  Procedure Laterality Date  . ABDOMINAL HYSTERECTOMY    . ABDOMINAL HYSTERECTOMY  1970's  . bladder botox  2005  . BLADDER SUSPENSION  2004  . COLONOSCOPY WITH PROPOFOL N/A 12/13/2015   Procedure: COLONOSCOPY WITH PROPOFOL;  Surgeon: Lucilla Lame, MD;  Location: [place];  Service: Endoscopy;   Laterality: N/A;  . POLYPECTOMY N/A 12/13/2015   Procedure: POLYPECTOMY;  Surgeon: Lucilla Lame, MD;  Location: [place];  Service: Endoscopy;  Laterality: N/A;  SIGMOID COLON POLYPS X  5  . SHOULDER ARTHROSCOPY W/ ROTATOR CUFF REPAIR Right 1998  . TONSILLECTOMY AND ADENOIDECTOMY       Current Outpatient Prescriptions  Medication Sig Dispense Refill  . albuterol (PROVENTIL HFA;VENTOLIN HFA) 108 (90 Base) MCG/ACT inhaler Inhale 2 puffs into the lungs every 6 (six) hours as needed for wheezing or shortness of breath. 1 Inhaler 2  . aspirin EC 81 MG tablet Take 81 mg by mouth daily.    . budesonide-formoterol (SYMBICORT) 160-4.5 MCG/ACT inhaler Inhale 2 puffs into the lungs 2 (two) times daily. 10.2 g 12  . Diphenhyd-Hydrocort-Nystatin (FIRST-DUKES MOUTHWASH) SUSP Use as directed 5 mLs in the mouth or throat 4 (four) times daily. 237 mL 1  . estradiol (VIVELLE-DOT) 0.1 MG/24HR patch Place 1 patch (0.1 mg total) onto the skin 2 (two) times a week. 8 patch 12  . fluconazole (DIFLUCAN) 100 MG tablet Take 1 tablet (100 mg total) by mouth daily. 7 tablet 0  . Multiple Vitamin (MULTIVITAMIN) tablet Take 1 tablet by mouth daily.    . SUPER GARLIC PO Take 2 tablets by mouth daily.     No current  facility-administered medications for this visit.     Allergies:   Bactrim [sulfamethoxazole-trimethoprim]; Ibuprofen; Latex; Naprosyn [naproxen]; and Other    Social History:  The patient  reports that she quit smoking about 4 months ago. Her smoking use included Cigarettes. She has a 25.00 pack-year smoking history. She has never used smokeless tobacco. She reports that she drinks alcohol. She reports that she does not use drugs.   Family History:  The patient's family history includes Diabetes in her mother; Heart disease in her mother; Stroke in her maternal grandmother and mother.    ROS:  Please see the history of present illness.   Otherwise, review of systems are positive for none.   All  other systems are reviewed and negative.    PHYSICAL EXAM: VS:  BP 128/64 (BP Location: Right Arm, Patient Position: Sitting, Cuff Size: Normal)   Pulse 74   Ht 5\' 9"  (1.753 m)   Wt 175 lb (79.4 kg)   LMP  (LMP Unknown)   BMI 25.84 kg/m  , BMI Body mass index is 25.84 kg/m. GEN: Well nourished, well developed, in no acute distress  HEENT: normal  Neck: no JVD, carotid bruits, or masses Cardiac: RRR; no murmurs, rubs, or gallops,no edema  Respiratory:  clear to auscultation bilaterally, normal work of breathing GI: soft, nontender, nondistended, + BS MS: no deformity or atrophy  Skin: warm and dry, no rash Neuro:  Strength and sensation are intact Psych: euthymic mood, full affect Distal pulses are diminished but palpable.  EKG:  EKG is ordered today. The ekg ordered today demonstrates normal sinus rhythm with no significant ST or T wave changes.   Recent Labs: No results found for requested labs within last 8760 hours.    Lipid Panel No results found for: CHOL, TRIG, HDL, CHOLHDL, VLDL, LDLCALC, LDLDIRECT    Wt Readings from Last 3 Encounters:  11/14/16 175 lb (79.4 kg)  11/14/16 175 lb (79.4 kg)  10/17/16 175 lb 9.6 oz (79.7 kg)      PAD Screen 11/14/2016  Previous PAD dx? No  Previous surgical procedure? No  Pain with walking? No  Feet/toe relief with dangling? No  Painful, non-healing ulcers? No  Extremities discolored? No      ASSESSMENT AND PLAN:  1.  Lower extremity traumatic wounds: These have healed completely with wound care. I do not see evidence of peripheral arterial disease. Although her distal pulses are diminished, her ABI was completely normal with normal waveform in normal to pressure. No further workup is recommended.  Disposition:   FU with me as needed.   Signed,  Kathlyn Sacramento, MD  11/14/2016 2:40 PM    [facility]

## 2016-11-14 NOTE — Patient Instructions (Signed)
Medication Instructions: No changes.   Labwork: None.   Procedures/Testing: None.   Follow-Up: As needed with Dr. Arida.   Any Additional Special Instructions Will Be Listed Below (If Applicable).     If you need a refill on your cardiac medications before your next appointment, please call your pharmacy.   

## 2016-11-14 NOTE — Progress Notes (Signed)
BP 131/73 (BP Location: Left Arm, Patient Position: Sitting, Cuff Size: Large)   Pulse 74   Temp 98.5 F (36.9 C)   Wt 175 lb (79.4 kg)   LMP  (LMP Unknown)   SpO2 97%   BMI 26.22 kg/m    Subjective:    Patient ID: Sharon [name], female    DOB: 12-03-1945, 71 y.o.   MRN: [id number]  HPI: Sharon [name] is a 71 y.o. female  Chief Complaint  Patient presents with  . Oral Pain    pt states she has been having mouth issues, wonders if it could be from symbicort  . Ear Pain    right ear, states she has had issues with this ear for years  . Labs Only    pt requests to have labs done, states she did not have any at physical last month    Ear pain Pt is here for complaints of right ear pain.  States she is having problems with her right ear ever since young.    Mouth pain States she is having thrush type symptoms in mouth and throat.  This has been going on since November.  She states she also quit smoking.  [facility] miracle mouth wash has not been effective.    URI   Chronicity: Symptoms started last Saturday.  Seems to be getting better. The problem has been gradually improving. Associated symptoms include congestion, coughing and nausea.   Fatigue Reports a lot of fatigue.  Plrobably for caregiven reponsibilities Relevant past medical, surgical, family and social history reviewed and updated as indicated. Interim medical history since our last visit reviewed. Allergies and medications reviewed and updated.  Review of Systems  HENT: Positive for congestion.   Respiratory: Positive for cough.   Gastrointestinal: Positive for nausea.    Per HPI unless specifically indicated above     Objective:    BP 131/73 (BP Location: Left Arm, Patient Position: Sitting, Cuff Size: Large)   Pulse 74   Temp 98.5 F (36.9 C)   Wt 175 lb (79.4 kg)   LMP  (LMP Unknown)   SpO2 97%   BMI 26.22 kg/m   Wt Readings from Last 3 Encounters:  11/14/16 175 lb (79.4 kg)  10/17/16 175 lb 9.6  oz (79.7 kg)  09/29/16 176 lb (79.8 kg)    Physical Exam  Constitutional: She is oriented to person, place, and time. She appears well-developed and well-nourished. No distress.  HENT:  Head: Normocephalic and atraumatic.  Right Ear: Tympanic membrane and ear canal normal.  Left Ear: Tympanic membrane and ear canal normal.  Nose: Rhinorrhea present. Right sinus exhibits no maxillary sinus tenderness and no frontal sinus tenderness. Left sinus exhibits no maxillary sinus tenderness and no frontal sinus tenderness.  Mouth/Throat: Mucous membranes are normal. Posterior oropharyngeal erythema present.  Tongue geopraphic and inflammed  Eyes: Conjunctivae and lids are normal. Right eye exhibits no discharge. Left eye exhibits no discharge. No scleral icterus.  Cardiovascular: Normal rate and regular rhythm.   Pulmonary/Chest: Effort normal and breath sounds normal. No respiratory distress.  Abdominal: Normal appearance. There is no splenomegaly or hepatomegaly.  Musculoskeletal: Normal range of motion.  Neurological: She is alert and oriented to person, place, and time.  Skin: Skin is intact. No rash noted. No pallor.  Psychiatric: She has a normal mood and affect. Her behavior is normal. Judgment and thought content normal.    Results for orders placed or performed in visit on 10/17/16  Rapid  strep screen (not at [facility])  Result Value Ref Range   Strep Gp A Ag, IA W/Reflex Negative Negative  Culture, Group A Strep  Result Value Ref Range   Strep A Culture CANCELED (A)   UA/M w/rflx Culture, Routine  Result Value Ref Range   Specific Gravity, UA 1.010 1.005 - 1.030   pH, UA 7.0 5.0 - 7.5   Color, UA Yellow Yellow   Appearance Ur Clear Clear   Leukocytes, UA Negative Negative   Protein, UA Negative Negative/Trace   Glucose, UA Negative Negative   Ketones, UA Negative Negative   RBC, UA Negative Negative   Bilirubin, UA Negative Negative   Urobilinogen, Ur 0.2 0.2 - 1.0 mg/dL    Nitrite, UA Negative Negative      Assessment & Plan:   Problem List Items Addressed This Visit      Unprioritized   Ear pain, right   Relevant Orders   Ambulatory referral to ENT   Glossitis   Relevant Medications   fluconazole (DIFLUCAN) 100 MG tablet    Other Visit Diagnoses    Viral upper respiratory tract infection    -  Primary   Relevant Medications   fluconazole (DIFLUCAN) 100 MG tablet   Fatigue, unspecified type       Relevant Orders   CBC with Differential/Platelet   Comprehensive metabolic panel   TSH       Follow up plan: Return if symptoms worsen or fail to improve.

## 2016-11-15 LAB — CBC WITH DIFFERENTIAL/PLATELET
BASOS ABS: 0 10*3/uL (ref 0.0–0.2)
Basos: 0 %
EOS (ABSOLUTE): 0.1 10*3/uL (ref 0.0–0.4)
Eos: 1 %
Hematocrit: 43.6 % (ref 34.0–46.6)
Hemoglobin: 14.6 g/dL (ref 11.1–15.9)
Immature Grans (Abs): 0.1 10*3/uL (ref 0.0–0.1)
Immature Granulocytes: 1 %
LYMPHS ABS: 1.5 10*3/uL (ref 0.7–3.1)
Lymphs: 16 %
MCH: 31 pg (ref 26.6–33.0)
MCHC: 33.5 g/dL (ref 31.5–35.7)
MCV: 93 fL (ref 79–97)
Monocytes Absolute: 0.9 10*3/uL (ref 0.1–0.9)
Monocytes: 10 %
Neutrophils Absolute: 6.8 10*3/uL (ref 1.4–7.0)
Neutrophils: 72 %
PLATELETS: 222 10*3/uL (ref 150–379)
RBC: 4.71 x10E6/uL (ref 3.77–5.28)
RDW: 13.4 % (ref 12.3–15.4)
WBC: 9.3 10*3/uL (ref 3.4–10.8)

## 2016-11-15 LAB — TSH: TSH: 1.03 u[IU]/mL (ref 0.450–4.500)

## 2016-11-15 LAB — COMPREHENSIVE METABOLIC PANEL
ALK PHOS: 80 IU/L (ref 39–117)
ALT: 25 IU/L (ref 0–32)
AST: 21 IU/L (ref 0–40)
Albumin/Globulin Ratio: 1.7 (ref 1.2–2.2)
Albumin: 4.1 g/dL (ref 3.5–4.8)
BUN/Creatinine Ratio: 15 (ref 12–28)
BUN: 13 mg/dL (ref 8–27)
Bilirubin Total: 0.5 mg/dL (ref 0.0–1.2)
CO2: 23 mmol/L (ref 18–29)
CREATININE: 0.84 mg/dL (ref 0.57–1.00)
Calcium: 9 mg/dL (ref 8.7–10.3)
Chloride: 100 mmol/L (ref 96–106)
GFR calc Af Amer: 81 mL/min/{1.73_m2} (ref >59)
GFR calc non Af Amer: 71 mL/min/{1.73_m2} (ref >59)
GLOBULIN, TOTAL: 2.4 g/dL (ref 1.5–4.5)
Glucose: 116 mg/dL — ABNORMAL HIGH (ref 65–99)
Potassium: 5 mmol/L (ref 3.5–5.2)
SODIUM: 140 mmol/L (ref 134–144)
Total Protein: 6.5 g/dL (ref 6.0–8.5)

## 2016-11-17 DIAGNOSIS — H6980 Other specified disorders of Eustachian tube, unspecified ear: Secondary | ICD-10-CM | POA: Diagnosis not present

## 2016-11-17 DIAGNOSIS — J301 Allergic rhinitis due to pollen: Secondary | ICD-10-CM | POA: Diagnosis not present

## 2016-12-03 ENCOUNTER — Telehealth: Payer: Self-pay

## 2016-12-03 DIAGNOSIS — K140 Glossitis: Secondary | ICD-10-CM

## 2016-12-03 NOTE — Telephone Encounter (Signed)
Patient called in stating that she could not see her lab results on mychart from last visit. Labs printed and sent to patient. While on the phone, patient stated that she is still having problems with her mouth. She stated that she is still having tingling of her tongue and no taste. Patient wants to know if she needs take some vitamin B12 or if she has some yeast or something. She states that she took all of the diflucan that was given and states the problem got better but did not go away completely. She states she has tried cough drops that have helped some as well. Patient stated that she does not want to come back for another appointment.

## 2016-12-05 ENCOUNTER — Other Ambulatory Visit: Payer: Self-pay

## 2016-12-05 NOTE — Telephone Encounter (Signed)
Refer back to ENT 

## 2016-12-05 NOTE — Telephone Encounter (Signed)
Called and let patient know that referral was entered. Patient stated that she was not going back to ENT. She stated that she mentioned her mouth issue to them before and stated that the doctor said that he did not see anything wrong. Patient also stated that it cost her an arm and a leg to go there and she was not going to pay that again. I offered to send this message back to [facility] to see if there was something else we could do on our end but the patient asked for me not to do that and stated that we had done what we could already. I asked for the patient to give [place] a call if she needed anything from [place].

## 2016-12-05 NOTE — Telephone Encounter (Signed)
Patient called in and stated that she may change her mind and go to ENT. She stated that her husband told her that she should go. Patient stated that she would think about it and decide when they call.

## 2016-12-18 DIAGNOSIS — K140 Glossitis: Secondary | ICD-10-CM | POA: Diagnosis not present

## 2016-12-18 DIAGNOSIS — K219 Gastro-esophageal reflux disease without esophagitis: Secondary | ICD-10-CM | POA: Diagnosis not present

## 2017-01-30 ENCOUNTER — Telehealth: Payer: Self-pay

## 2017-01-30 MED ORDER — DOXYCYCLINE HYCLATE 100 MG PO TABS: 100.0000 mg | ORAL_TABLET | Freq: Two times a day (BID) | ORAL | 0 refills | Status: DC

## 2017-01-30 NOTE — Telephone Encounter (Signed)
Called and let patient know that doxycycline was sent in for her.

## 2017-01-30 NOTE — Telephone Encounter (Signed)
[phone]  Patient found tick in th bend of knee, unsure of how long it has been there. Area is red but pt is unsure if that is due to recent removal or not. Pt did get the head out as well. No pain. Pt wants to know if she need to be seen or have something to take. Please advise.

## 2017-01-30 NOTE — Telephone Encounter (Signed)
Patient called in and left me a VM wanting to know where we sent here a few years ago for a breast issue. Patient called again and I spoke with her. She states that she has found a knot in her breast and she remembered the doctor who we sent her to before, Dr. Bary Castilla. Patient states that she called them and has an appointment scheduled with them on 02/10/17. Patient also wanted to let us know that she is going to see a specialist next week for the issues she has been having with her mouth.

## 2017-01-30 NOTE — Telephone Encounter (Signed)
Watch for Fatigue, rash, headache, muscle or joint pain, fever.  If she got an illness from the tick, these symptoms will come in 1-3 weeks and ask her to come in at that time to be tested.

## 2017-01-30 NOTE — Telephone Encounter (Signed)
Called and let patient know what Malachy Mood said. She states that she is unsure of how long the tick has been there and would like some doxycycline sent in to [facility]. Patient states Malachy Mood has always given her the doxycycline in the past.

## 2017-02-10 ENCOUNTER — Encounter: Payer: Self-pay | Admitting: General Surgery

## 2017-02-10 ENCOUNTER — Ambulatory Visit (INDEPENDENT_AMBULATORY_CARE_PROVIDER_SITE_OTHER): Payer: Medicare Other | Admitting: General Surgery

## 2017-02-10 ENCOUNTER — Inpatient Hospital Stay: Payer: Self-pay

## 2017-02-10 VITALS — BP 132/64 | HR 80 | Resp 12 | Ht 68.5 in | Wt 177.0 lb

## 2017-02-10 DIAGNOSIS — N6312 Unspecified lump in the right breast, upper inner quadrant: Secondary | ICD-10-CM

## 2017-02-10 NOTE — Progress Notes (Signed)
Patient ID: Sharon [name], female   DOB: 1946/10/08, 71 y.o.   MRN: [id number]  Chief Complaint  Patient presents with  . Breast Problem    HPI Sharon [name] is a 71 y.o. female.who presents for a breast evaluation. The most recent mammogram was done in 2017. She is having right breast pain > left breat pain. The pain comes and goes lasting about several hours for about 3 weeks. She states it is worse if she sleeps on that side. Aspirin does help. She feels like the left breast is her cyst flaring up. She admits to increasing her caffeine intake. She states she can feel two knots or 'little kernel". Patient does perform regular self breast checks and gets regular mammograms done.   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 .  She states she has been busy with her husband and caring for him last year. He will be starting dialysis and is going on the kidney transplant list.  HPI  Past Medical History:  Diagnosis Date  . Anxiety   . Arthritis    hands, upper back  . Asthma   . COPD (chronic obstructive pulmonary disease) ([place])   . History of cervical cancer   . Menopausal disorder   . Osteoporosis   . Pneumonia 1960  . Spasm of abdominal muscles of right side    intermittent  . TMJ (dislocation of temporomandibular joint)   . Wears dentures    partial lower    Past Surgical History:  Procedure Laterality Date  . ABDOMINAL HYSTERECTOMY    . ABDOMINAL HYSTERECTOMY  1970's  . bladder botox  2005  . BLADDER SUSPENSION  2004  . COLONOSCOPY WITH PROPOFOL N/A 12/13/2015   Procedure: COLONOSCOPY WITH PROPOFOL;  Surgeon: Lucilla Lame, MD;  Location: [place];  Service: Endoscopy;  Laterality: N/A;  . POLYPECTOMY N/A 12/13/2015   Procedure: POLYPECTOMY;  Surgeon: Lucilla Lame, MD;  Location: [facility];  Service: Endoscopy;  Laterality: N/A;  SIGMOID COLON POLYPS X  5  . SHOULDER ARTHROSCOPY W/ ROTATOR  CUFF REPAIR Right 1998  . TONSILLECTOMY AND ADENOIDECTOMY      Family History  Problem Relation Age of Onset  . Diabetes Mother   . Heart disease Mother   . Stroke Mother   . Stroke Maternal Grandmother     Social History Social History  Substance Use Topics  . Smoking status: Former Smoker    Packs/day: 0.50    Years: 50.00    Types: Cigarettes    Quit date: 07/14/2016  . Smokeless tobacco: Never Used     Comment: has smoked off and on  . Alcohol use 0.0 oz/week     Comment: occasionally - special occasions    Allergies  Allergen Reactions  . Bactrim [Sulfamethoxazole-Trimethoprim] Nausea And Vomiting  . Ibuprofen Rash    Mouth swelling  . Latex Rash    Some bandaids, some gloves  . Naprosyn [Naproxen] Rash    Mouth swelling  . Other Rash    [place] nuts - mouth swelling    Current Outpatient Prescriptions  Medication Sig Dispense Refill  . albuterol (PROAIR HFA) 108 (90 Base) MCG/ACT inhaler Inhale 2 puffs into the lungs every 6 (six) hours.    Marland Kitchen albuterol (PROVENTIL HFA;VENTOLIN HFA) 108 (90 Base) MCG/ACT inhaler Inhale 2 puffs into the lungs every 6 (six) hours as needed for wheezing or shortness of breath. 1 Inhaler 2  .  aspirin EC 81 MG tablet Take 81 mg by mouth daily.    Marland Kitchen b complex vitamins capsule Take 1 capsule by mouth daily.    Marland Kitchen estradiol (VIVELLE-DOT) 0.1 MG/24HR patch APPLY 1 PATCH ONTO THE SKIN 2 TIMES A WEEK 8 patch 12  . Multiple Vitamin (MULTIVITAMIN) tablet Take 1 tablet by mouth daily.    Marland Kitchen omeprazole (PRILOSEC) 20 MG capsule Take 20 mg by mouth daily.    . SUPER GARLIC PO Take 2 tablets by mouth daily.    . budesonide-formoterol (SYMBICORT) 160-4.5 MCG/ACT inhaler Inhale 2 puffs into the lungs 2 (two) times daily. (Patient not taking: Reported on 02/10/2017) 10.2 g 12   No current facility-administered medications for this visit.     Review of Systems Review of Systems  Constitutional: Negative.   Respiratory: Negative.   Cardiovascular:  Negative.     Blood pressure 132/64, pulse 80, resp. rate 12, height 5' 8.5" (1.74 m), weight 177 lb (80.3 kg).  Physical Exam Physical Exam  Constitutional: She is oriented to person, place, and time. She appears well-developed and well-nourished.  HENT:  Mouth/Throat: Oropharynx is clear and moist.  Eyes: Conjunctivae are normal. No scleral icterus.  Neck: Neck supple.  Cardiovascular: Normal rate, regular rhythm and normal heart sounds.   Pulmonary/Chest: Effort normal and breath sounds normal. Right breast exhibits mass. Right breast exhibits no inverted nipple, no nipple discharge, no skin change and no tenderness. Left breast exhibits no inverted nipple, no mass, no nipple discharge, no skin change and no tenderness.  2 mm nodule, variably palpable during the exam. Likely subcutaneous. 3 o'clock 9 CFN right breast.  Lymphadenopathy:    She has no cervical adenopathy.    She has no axillary adenopathy.  Neurological: She is alert and oriented to person, place, and time.  Skin: Skin is warm and dry.  Psychiatric: Her behavior is normal.    Data Reviewed Bilateral screening mammograms completed at [facility] dated 06/18/2016 were reviewed. Scattered fibroglandular tissue. BI-RADS-2.  Limited ultrasound examination of the right breast was completed in the area of clinical concern. No cystic or solid lesions were identified. No images. BI-RADS-2.    Assessment    Focal fibrocystic changes related to increased caffeine consumption.  No clinical suspicion for malignancy.    Plan    Caffeine consumption should be moderated.       The patient has been asked to return to the office in four months with a bilateral breast screening mammogram at [facility].   HPI, Physical Exam, Assessment and Plan have been scribed under the direction and in the presence of Robert Bellow, MD.  Karie Fetch, RN  I have completed the exam and reviewed the above documentation for accuracy  and completeness.  I agree with the above.  Haematologist has been used and any errors in dictation or transcription are unintentional.  [name], M.D., F.A.C.S.  Robert Bellow 02/11/2017, 3:41 PM

## 2017-02-10 NOTE — Patient Instructions (Addendum)
The patient is aware to call back for any questions or concerns. The patient has been asked to return to the office in four months with a Bilateral breast screening mammogram.

## 2017-03-11 ENCOUNTER — Telehealth: Payer: Self-pay

## 2017-03-11 NOTE — Telephone Encounter (Signed)
Patient called in and left me a VM wanting me to return her call to discuss her medications. Tried calling patient back, but she did not answer so I left her a VM asking for her to please return my call.

## 2017-03-11 NOTE — Telephone Encounter (Signed)
Patient returned my call. She states that she has been seeing a periodontist for her mouth. She states that she has been diagnosed with burning mouth disease that may or not go away. She state that she has been given different medications for her mouth that have not worked and she is being given a new mouth wash today. She states that she has not been taking her symbicort but feels that she needs to be back on it. Patient wants to know if she can take the mouth wash and the symbicort together. I asked the patient if the periodontist said it was OK for her to take both medications and she stated that they said it was but that she is going to check with the pharmacist as well. Patient is going to call back with the name of the mouth wash when she picks it up.

## 2017-03-13 ENCOUNTER — Encounter: Payer: Self-pay | Admitting: Unknown Physician Specialty

## 2017-03-13 ENCOUNTER — Ambulatory Visit (INDEPENDENT_AMBULATORY_CARE_PROVIDER_SITE_OTHER): Payer: Medicare Other | Admitting: Unknown Physician Specialty

## 2017-03-13 DIAGNOSIS — J449 Chronic obstructive pulmonary disease, unspecified: Secondary | ICD-10-CM

## 2017-03-13 DIAGNOSIS — K140 Glossitis: Secondary | ICD-10-CM | POA: Diagnosis not present

## 2017-03-13 DIAGNOSIS — F419 Anxiety disorder, unspecified: Secondary | ICD-10-CM | POA: Diagnosis not present

## 2017-03-13 MED ORDER — CLONAZEPAM 0.5 MG PO TABS: 0.5000 mg | ORAL_TABLET | Freq: Every day | ORAL | 1 refills | Status: DC | PRN

## 2017-03-13 MED ORDER — CYANOCOBALAMIN 1000 MCG/ML IJ SOLN
1000.0000 ug | INTRAMUSCULAR | Status: AC
  Administered 2017-03-13 – 2018-02-16 (×12): 1000 ug via INTRAMUSCULAR

## 2017-03-13 NOTE — Assessment & Plan Note (Addendum)
Seeing peridontist.  Restart inhalers.  Try a B12 shot and see if it helps symptoms.

## 2017-03-13 NOTE — Progress Notes (Signed)
BP 124/69   Pulse 99   Temp 97.9 F (36.6 C)   Wt 180 lb (81.6 kg) Comment: pt had shoes on  LMP  (LMP Unknown)   SpO2 96%   BMI 26.97 kg/m    Subjective:    Patient ID: Sharon [name], female    DOB: Aug 06, 1946, 71 y.o.   MRN: [id number]  HPI: Sharon [name] is a 71 y.o. female  Chief Complaint  Patient presents with  . Anxiety    pt states she had a panic attack on Tuesday and would like a prescription for klonopin. States she took this about 8 years ago.   . Medication Problem    pt states that she has been given lidocaine mouthwash for burning mouth syndrome, and wants to know if she can use her albuterol and symbicort with the mouthwash   Anxiety Pt states she has a panic attack because she was upset due to traffic.  State she gets like this when she is out in traffic.  About 10 years ago she had a prescription for Clonazepam that she took on occasion.    Glossitis Treated by ENT.  She would like to restart her inhalers.  Tried Librium which caused dizzyness  Relevant past medical, surgical, family and social history reviewed and updated as indicated. Interim medical history since our last visit reviewed. Allergies and medications reviewed and updated.  Review of Systems  Per HPI unless specifically indicated above     Objective:    BP 124/69   Pulse 99   Temp 97.9 F (36.6 C)   Wt 180 lb (81.6 kg) Comment: pt had shoes on  LMP  (LMP Unknown)   SpO2 96%   BMI 26.97 kg/m   Wt Readings from Last 3 Encounters:  03/13/17 180 lb (81.6 kg)  02/10/17 177 lb (80.3 kg)  11/14/16 175 lb (79.4 kg)    Physical Exam  Constitutional: She is oriented to person, place, and time. She appears well-developed and well-nourished. No distress.  HENT:  Head: Normocephalic and atraumatic.  Eyes: Conjunctivae and lids are normal. Right eye exhibits no discharge. Left eye exhibits no discharge. No scleral icterus.  Neck: Normal range of motion. Neck supple. No JVD present.  Carotid bruit is not present.  Cardiovascular: Normal rate, regular rhythm and normal heart sounds.   Pulmonary/Chest: Effort normal and breath sounds normal.  Abdominal: Normal appearance. There is no splenomegaly or hepatomegaly.  Musculoskeletal: Normal range of motion.  Neurological: She is alert and oriented to person, place, and time.  Skin: Skin is warm, dry and intact. No rash noted. No pallor.  Psychiatric: She has a normal mood and affect. Her behavior is normal. Judgment and thought content normal.    Results for orders placed or performed in visit on 11/14/16  CBC with Differential/Platelet  Result Value Ref Range   WBC 9.3 3.4 - 10.8 x10E3/uL   RBC 4.71 3.77 - 5.28 x10E6/uL   Hemoglobin 14.6 11.1 - 15.9 g/dL   Hematocrit 43.6 34.0 - 46.6 %   MCV 93 79 - 97 fL   MCH 31.0 26.6 - 33.0 pg   MCHC 33.5 31.5 - 35.7 g/dL   RDW 13.4 12.3 - 15.4 %   Platelets 222 150 - 379 x10E3/uL   Neutrophils 72 Not Estab. %   Lymphs 16 Not Estab. %   Monocytes 10 Not Estab. %   Eos 1 Not Estab. %   Basos 0 Not Estab. %  Neutrophils Absolute 6.8 1.4 - 7.0 x10E3/uL   Lymphocytes Absolute 1.5 0.7 - 3.1 x10E3/uL   Monocytes Absolute 0.9 0.1 - 0.9 x10E3/uL   EOS (ABSOLUTE) 0.1 0.0 - 0.4 x10E3/uL   Basophils Absolute 0.0 0.0 - 0.2 x10E3/uL   Immature Granulocytes 1 Not Estab. %   Immature Grans (Abs) 0.1 0.0 - 0.1 x10E3/uL  Comprehensive metabolic panel  Result Value Ref Range   Glucose 116 (H) 65 - 99 mg/dL   BUN 13 8 - 27 mg/dL   Creatinine, Ser 0.84 0.57 - 1.00 mg/dL   GFR calc non Af Amer 71 >59 mL/min/1.73   GFR calc Af Amer 81 >59 mL/min/1.73   BUN/Creatinine Ratio 15 12 - 28   Sodium 140 134 - 144 mmol/L   Potassium 5.0 3.5 - 5.2 mmol/L   Chloride 100 96 - 106 mmol/L   CO2 23 18 - 29 mmol/L   Calcium 9.0 8.7 - 10.3 mg/dL   Total Protein 6.5 6.0 - 8.5 g/dL   Albumin 4.1 3.5 - 4.8 g/dL   Globulin, Total 2.4 1.5 - 4.5 g/dL   Albumin/Globulin Ratio 1.7 1.2 - 2.2   Bilirubin  Total 0.5 0.0 - 1.2 mg/dL   Alkaline Phosphatase 80 39 - 117 IU/L   AST 21 0 - 40 IU/L   ALT 25 0 - 32 IU/L  TSH  Result Value Ref Range   TSH 1.030 0.450 - 4.500 uIU/mL  +    Assessment & Plan:   Problem List Items Addressed This Visit      Unprioritized   Anxiety    OK for occasional use when a passenger in a car.        COPD (chronic obstructive pulmonary disease) (H[facility])    Restart inhalers      Glossitis    Seeing peridontist.  Restart inhalers.  Try a B12 shot and see if it helps symptoms.        Relevant Medications   cyanocobalamin ((VITAMIN B-12)) injection 1,000 mcg (Start on 03/13/2017 11:30 AM)       Follow up plan: Return if symptoms worsen or fail to improve.

## 2017-03-13 NOTE — Assessment & Plan Note (Signed)
OK for occasional use when a passenger in a car.

## 2017-03-13 NOTE — Assessment & Plan Note (Signed)
Restart inhalers 

## 2017-03-13 NOTE — Telephone Encounter (Signed)
Patient returned my call. She states that the mouth wash is called lidocaine. Patient also asked about getting a prescription for clonazepam. She states that she has had this in the past for anxiety. Patient states that she believes that this medication will also help with the burning mouth syndrome. Patient on the schedule to see Malachy Mood today. If patient comes to appointment, this will be discussed then. If not, will route to provider.

## 2017-03-13 NOTE — Telephone Encounter (Signed)
Patient came in for appointment, see OV note from today.

## 2017-03-16 ENCOUNTER — Encounter: Payer: Self-pay | Admitting: *Deleted

## 2017-03-16 ENCOUNTER — Telehealth: Payer: Self-pay | Admitting: *Deleted

## 2017-03-16 DIAGNOSIS — Z87891 Personal history of nicotine dependence: Secondary | ICD-10-CM

## 2017-03-16 NOTE — Telephone Encounter (Signed)
Received referral for initial lung cancer screening scan. Contacted patient and obtained smoking history,(former, quit 07/14/16, 33.6 pack year) as well as answering questions related to screening process. Patient denies signs of lung cancer such as weight loss or hemoptysis. Patient denies comorbidity that would prevent curative treatment if lung cancer were found. Patient is scheduled for shared decision making visit and CT scan on 03/31/17.

## 2017-03-17 ENCOUNTER — Telehealth: Payer: Self-pay

## 2017-03-17 NOTE — Telephone Encounter (Signed)
Patient called in asking about her B12 injection. Patient wants to know if she is supposed to come back every 30 days for an injection or if it was just a one time thing. (Tried looking at OV note, but could not find an answer)

## 2017-03-17 NOTE — Telephone Encounter (Signed)
For now, it's a one time thing to see if it helps.  If it does, we can make it monthly

## 2017-03-17 NOTE — Telephone Encounter (Signed)
Called and spoke to patient. I let her know what Sharon [name] said and she said she has felt better since getting her first injection. Patient states she will call me back and let me know if she continues to feel well or not.

## 2017-03-31 ENCOUNTER — Ambulatory Visit
Admission: RE | Admit: 2017-03-31 | Discharge: 2017-03-31 | Disposition: A | Discharge status: Home / Self Care | Payer: Medicare Other | Source: Ambulatory Visit | Attending: Oncology | Admitting: Oncology

## 2017-03-31 ENCOUNTER — Inpatient Hospital Stay: Payer: Medicare Other | Attending: Oncology | Admitting: Oncology

## 2017-03-31 DIAGNOSIS — I700 Atherosclerosis of aorta: Secondary | ICD-10-CM | POA: Diagnosis not present

## 2017-03-31 DIAGNOSIS — Z87891 Personal history of nicotine dependence: Secondary | ICD-10-CM | POA: Diagnosis not present

## 2017-03-31 DIAGNOSIS — I712 Thoracic aortic aneurysm, without rupture: Secondary | ICD-10-CM | POA: Insufficient documentation

## 2017-03-31 DIAGNOSIS — I2510 Atherosclerotic heart disease of native coronary artery without angina pectoris: Secondary | ICD-10-CM | POA: Insufficient documentation

## 2017-03-31 DIAGNOSIS — F17210 Nicotine dependence, cigarettes, uncomplicated: Secondary | ICD-10-CM | POA: Diagnosis not present

## 2017-03-31 DIAGNOSIS — Z122 Encounter for screening for malignant neoplasm of respiratory organs: Secondary | ICD-10-CM

## 2017-04-01 DIAGNOSIS — Z87891 Personal history of nicotine dependence: Secondary | ICD-10-CM | POA: Insufficient documentation

## 2017-04-01 NOTE — Progress Notes (Signed)
In accordance with CMS guidelines, patient has met eligibility criteria including age, absence of signs or symptoms of lung cancer.  Social History  Substance Use Topics  . Smoking status: Former Smoker    Packs/day: 0.60    Years: 56.00    Types: Cigarettes    Quit date: 07/14/2016  . Smokeless tobacco: Never Used     Comment: has smoked off and on  . Alcohol use 0.0 oz/week     Comment: occasionally - special occasions     A shared decision-making session was conducted prior to the performance of CT scan. This includes one or more decision aids, includes benefits and harms of screening, follow-up diagnostic testing, over-diagnosis, false positive rate, and total radiation exposure.  Counseling on the importance of adherence to annual lung cancer LDCT screening, impact of co-morbidities, and ability or willingness to undergo diagnosis and treatment is imperative for compliance of the program.  Counseling on the importance of continued smoking cessation for former smokers; the importance of smoking cessation for current smokers, and information about tobacco cessation interventions have been given to patient including [place] [facility] and [employer] quit [place] programs.  Written order for lung cancer screening with LDCT has been given to the patient and any and all questions have been answered to the best of my abilities.   Yearly follow up will be coordinated by Shawn Perkins, Thoracic Navigator.  

## 2017-04-02 ENCOUNTER — Encounter: Payer: Self-pay | Admitting: *Deleted

## 2017-04-03 ENCOUNTER — Other Ambulatory Visit: Payer: Self-pay | Admitting: Unknown Physician Specialty

## 2017-04-03 NOTE — Telephone Encounter (Signed)
Your patient 

## 2017-04-07 ENCOUNTER — Telehealth: Payer: Self-pay

## 2017-04-07 NOTE — Telephone Encounter (Signed)
Called and spoke to patient. I let her know what Malachy Mood said and patient states that she would like to come by for another injection and I told the patient that that was fine, and that she could just stop by. Order already in.

## 2017-04-07 NOTE — Telephone Encounter (Signed)
If it has helped, she can come in for monthly injections

## 2017-04-07 NOTE — Telephone Encounter (Signed)
Patient called and asked about the b12 injections. Patient states that the burning in her mouth is beginning to start again and wonders if she still needs the b12 injections. Patient's last injection was 03/13/17. Can patient come in for another injection? ( Looks like there is an order in the chart for one)

## 2017-04-13 ENCOUNTER — Ambulatory Visit (INDEPENDENT_AMBULATORY_CARE_PROVIDER_SITE_OTHER): Payer: Medicare Other

## 2017-04-13 DIAGNOSIS — K140 Glossitis: Secondary | ICD-10-CM

## 2017-04-29 ENCOUNTER — Ambulatory Visit (INDEPENDENT_AMBULATORY_CARE_PROVIDER_SITE_OTHER): Payer: Medicare Other | Admitting: Family Medicine

## 2017-04-29 ENCOUNTER — Encounter: Payer: Self-pay | Admitting: Family Medicine

## 2017-04-29 ENCOUNTER — Telehealth: Payer: Self-pay

## 2017-04-29 VITALS — BP 125/72 | HR 84 | Temp 98.0°F | Wt 179.0 lb

## 2017-04-29 DIAGNOSIS — J069 Acute upper respiratory infection, unspecified: Secondary | ICD-10-CM

## 2017-04-29 MED ORDER — GUAIFENESIN ER 600 MG PO TB12: 600.0000 mg | ORAL_TABLET | Freq: Two times a day (BID) | ORAL | 0 refills | Status: DC

## 2017-04-29 MED ORDER — AZITHROMYCIN 250 MG PO TABS: ORAL_TABLET | ORAL | 0 refills | Status: DC

## 2017-04-29 MED ORDER — HYDROCOD POLST-CPM POLST ER 10-8 MG/5ML PO SUER: 5.0000 mL | Freq: Two times a day (BID) | ORAL | 0 refills | Status: DC | PRN

## 2017-04-29 MED ORDER — BENZONATATE 100 MG PO CAPS
200.0000 mg | ORAL_CAPSULE | Freq: Three times a day (TID) | ORAL | 0 refills | Status: DC | PRN
Start: 2017-04-29 — End: 2017-06-01

## 2017-04-29 NOTE — Telephone Encounter (Signed)
Patient called and stated that she has been feeling bad since Friday. States she had a fever that is now gone, green mucous, and chest congestion. She wanted to know if she should be seen or if she could have something called in. I asked Malachy Mood about this and she said that she would like for the patient to be seen. Scheduled patient to see Apolonio Schneiders this morning at 10:00.

## 2017-04-29 NOTE — Progress Notes (Signed)
   BP 125/72 (BP Location: Left Arm, Patient Position: Sitting, Cuff Size: Normal)   Pulse 84   Temp 98 F (36.7 C) (Oral)   Wt 179 lb (81.2 kg)   LMP  (LMP Unknown)   SpO2 97%   BMI 26.82 kg/m    Subjective:    Patient ID: Sharon [name], female    DOB: 10-10-1946, 70 y.o.   MRN: [id number]  HPI: Sharon [name] is a 71 y.o. female  Chief Complaint  Patient presents with  . URI    Since since about July 6th.    Fever, chills, sweats, productive cough, congestion, facial pain x 2+ weeks. Sxs worsening the past few days. Tried OTC robitussin and tylenol with minimal relief. Denies SOB, N/V/D. Husband was sick with similar sxs earlier this month.   Relevant past medical, surgical, family and social history reviewed and updated as indicated. Interim medical history since our last visit reviewed. Allergies and medications reviewed and updated.  Review of Systems  Constitutional: Positive for chills, fatigue and fever.  HENT: Positive for congestion, sinus pain and sinus pressure.   Respiratory: Positive for cough.   Gastrointestinal: Negative.   Musculoskeletal: Negative.   Neurological: Negative.   Psychiatric/Behavioral: Negative.    Per HPI unless specifically indicated above     Objective:    BP 125/72 (BP Location: Left Arm, Patient Position: Sitting, Cuff Size: Normal)   Pulse 84   Temp 98 F (36.7 C) (Oral)   Wt 179 lb (81.2 kg)   LMP  (LMP Unknown)   SpO2 97%   BMI 26.82 kg/m   Wt Readings from Last 3 Encounters:  04/29/17 179 lb (81.2 kg)  03/31/17 180 lb (81.6 kg)  03/13/17 180 lb (81.6 kg)    Physical Exam  Constitutional: She is oriented to person, place, and time. She appears well-developed and well-nourished. No distress.  HENT:  Head: Atraumatic.  Right Ear: External ear normal.  Left Ear: External ear normal.  B/l maxillary sinuses ttp Nasal mucosa erythematous with thick drainage present  Eyes: Pupils are equal, round, and reactive to light.  Conjunctivae are normal.  Neck: Normal range of motion. Neck supple.  Cardiovascular: Normal rate and normal heart sounds.   Pulmonary/Chest: Effort normal and breath sounds normal. No respiratory distress. She has no wheezes.  Musculoskeletal: Normal range of motion.  Lymphadenopathy:    She has no cervical adenopathy.  Neurological: She is alert and oriented to person, place, and time.  Skin: Skin is warm and dry.  Psychiatric: She has a normal mood and affect. Her behavior is normal.  Nursing note and vitals reviewed.     Assessment & Plan:   Problem List Items Addressed This Visit    None    Visit Diagnoses    Upper respiratory tract infection, unspecified type    -  Primary   Zpack, tessalon, mucinex, and tussionex sent. Precautions and supportive care reviewed. F/u if worsening or no improvement   Relevant Medications   azithromycin (ZITHROMAX) 250 MG tablet       Follow up plan: Return if symptoms worsen or fail to improve.

## 2017-05-07 ENCOUNTER — Other Ambulatory Visit: Payer: Self-pay | Admitting: Unknown Physician Specialty

## 2017-05-12 ENCOUNTER — Ambulatory Visit (INDEPENDENT_AMBULATORY_CARE_PROVIDER_SITE_OTHER): Payer: Medicare Other

## 2017-05-12 DIAGNOSIS — K140 Glossitis: Secondary | ICD-10-CM

## 2017-05-12 DIAGNOSIS — E538 Deficiency of other specified B group vitamins: Secondary | ICD-10-CM

## 2017-06-01 ENCOUNTER — Encounter: Payer: Self-pay | Admitting: Unknown Physician Specialty

## 2017-06-01 ENCOUNTER — Ambulatory Visit (INDEPENDENT_AMBULATORY_CARE_PROVIDER_SITE_OTHER): Payer: Medicare Other | Admitting: Unknown Physician Specialty

## 2017-06-01 VITALS — BP 127/78 | HR 76 | Temp 98.4°F | Wt 183.0 lb

## 2017-06-01 DIAGNOSIS — M778 Other enthesopathies, not elsewhere classified: Secondary | ICD-10-CM

## 2017-06-01 NOTE — Progress Notes (Signed)
BP 127/78   Pulse 76   Temp 98.4 F (36.9 C)   Wt 183 lb (83 kg)   LMP  (LMP Unknown)   SpO2 97%   BMI 27.42 kg/m    Subjective:    Patient ID: Sharon [name], female    DOB: 10-10-1946, 71 y.o.   MRN: [id number]  HPI: Sharon [name] is a 71 y.o. female  Chief Complaint  Patient presents with  . Wrist Pain    pt states that her right wrist has been hurting for a while, states she has noticed 2 knots on it and the pain goes up into her palm   Pt is here for complaints of right wrist pain.  State she is having a couple of knots in right wrist which is sore and tender.  She is using a flexible brace which does not immobilize wrist.     Relevant past medical, surgical, family and social history reviewed and updated as indicated. Interim medical history since our last visit reviewed. Allergies and medications reviewed and updated.  Review of Systems  Per HPI unless specifically indicated above     Objective:    BP 127/78   Pulse 76   Temp 98.4 F (36.9 C)   Wt 183 lb (83 kg)   LMP  (LMP Unknown)   SpO2 97%   BMI 27.42 kg/m   Wt Readings from Last 3 Encounters:  06/01/17 183 lb (83 kg)  04/29/17 179 lb (81.2 kg)  03/31/17 180 lb (81.6 kg)    Physical Exam  Constitutional: She is oriented to person, place, and time. She appears well-developed and well-nourished. No distress.  HENT:  Head: Normocephalic and atraumatic.  Eyes: Conjunctivae and lids are normal. Right eye exhibits no discharge. Left eye exhibits no discharge. No scleral icterus.  Cardiovascular: Normal rate.   Pulmonary/Chest: Effort normal.  Abdominal: Normal appearance. There is no splenomegaly or hepatomegaly.  Musculoskeletal: Normal range of motion.       Right wrist: She exhibits tenderness and bony tenderness. She exhibits normal range of motion.  Pt with positive Quervan's sighn  Neurological: She is alert and oriented to person, place, and time.  Skin: Skin is intact. No rash noted. No  pallor.  Psychiatric: She has a normal mood and affect. Her behavior is normal. Judgment and thought content normal.    Results for orders placed or performed in visit on 11/14/16  CBC with Differential/Platelet  Result Value Ref Range   WBC 9.3 3.4 - 10.8 x10E3/uL   RBC 4.71 3.77 - 5.28 x10E6/uL   Hemoglobin 14.6 11.1 - 15.9 g/dL   Hematocrit 43.6 34.0 - 46.6 %   MCV 93 79 - 97 fL   MCH 31.0 26.6 - 33.0 pg   MCHC 33.5 31.5 - 35.7 g/dL   RDW 13.4 12.3 - 15.4 %   Platelets 222 150 - 379 x10E3/uL   Neutrophils 72 Not Estab. %   Lymphs 16 Not Estab. %   Monocytes 10 Not Estab. %   Eos 1 Not Estab. %   Basos 0 Not Estab. %   Neutrophils Absolute 6.8 1.4 - 7.0 x10E3/uL   Lymphocytes Absolute 1.5 0.7 - 3.1 x10E3/uL   Monocytes Absolute 0.9 0.1 - 0.9 x10E3/uL   EOS (ABSOLUTE) 0.1 0.0 - 0.4 x10E3/uL   Basophils Absolute 0.0 0.0 - 0.2 x10E3/uL   Immature Granulocytes 1 Not Estab. %   Immature Grans (Abs) 0.1 0.0 - 0.1 x10E3/uL  Comprehensive metabolic panel  Result Value Ref Range   Glucose 116 (H) 65 - 99 mg/dL   BUN 13 8 - 27 mg/dL   Creatinine, Ser 0.84 0.57 - 1.00 mg/dL   GFR calc non Af Amer 71 >59 mL/min/1.73   GFR calc Af Amer 81 >59 mL/min/1.73   BUN/Creatinine Ratio 15 12 - 28   Sodium 140 134 - 144 mmol/L   Potassium 5.0 3.5 - 5.2 mmol/L   Chloride 100 96 - 106 mmol/L   CO2 23 18 - 29 mmol/L   Calcium 9.0 8.7 - 10.3 mg/dL   Total Protein 6.5 6.0 - 8.5 g/dL   Albumin 4.1 3.5 - 4.8 g/dL   Globulin, Total 2.4 1.5 - 4.5 g/dL   Albumin/Globulin Ratio 1.7 1.2 - 2.2   Bilirubin Total 0.5 0.0 - 1.2 mg/dL   Alkaline Phosphatase 80 39 - 117 IU/L   AST 21 0 - 40 IU/L   ALT 25 0 - 32 IU/L  TSH  Result Value Ref Range   TSH 1.030 0.450 - 4.500 uIU/mL      Assessment & Plan:   Problem List Items Addressed This Visit    None    Visit Diagnoses    Tendonitis of wrist, right    -  Primary   Quervan's tendonitis right wrist.  recommended spica and may need full wrist  spint.  Did not tolerate splint in the office.         Follow up plan: Return in about 2 weeks (around 06/15/2017).

## 2017-06-01 NOTE — Patient Instructions (Signed)
Right thumb spica splint

## 2017-06-05 ENCOUNTER — Telehealth: Payer: Self-pay

## 2017-06-05 MED ORDER — MELOXICAM 15 MG PO TABS: 15.0000 mg | ORAL_TABLET | Freq: Every day | ORAL | 0 refills | Status: DC

## 2017-06-05 NOTE — Telephone Encounter (Signed)
Called and let patient know about meloxicam being sent in for her.

## 2017-06-05 NOTE — Telephone Encounter (Signed)
Patient called and would like to have something for inflammation sent into [facility] for her wrist. She states that she found the brace that Lynn recommended but had to order it.

## 2017-06-11 ENCOUNTER — Ambulatory Visit (INDEPENDENT_AMBULATORY_CARE_PROVIDER_SITE_OTHER): Payer: Medicare Other

## 2017-06-11 DIAGNOSIS — K140 Glossitis: Secondary | ICD-10-CM | POA: Diagnosis not present

## 2017-06-11 DIAGNOSIS — E538 Deficiency of other specified B group vitamins: Secondary | ICD-10-CM

## 2017-06-19 ENCOUNTER — Ambulatory Visit (INDEPENDENT_AMBULATORY_CARE_PROVIDER_SITE_OTHER): Payer: Medicare Other | Admitting: Unknown Physician Specialty

## 2017-06-19 ENCOUNTER — Encounter: Payer: Self-pay | Admitting: Unknown Physician Specialty

## 2017-06-19 VITALS — BP 131/74 | HR 88 | Temp 98.7°F | Wt 187.6 lb

## 2017-06-19 DIAGNOSIS — M778 Other enthesopathies, not elsewhere classified: Secondary | ICD-10-CM | POA: Diagnosis not present

## 2017-06-19 DIAGNOSIS — N393 Stress incontinence (female) (male): Secondary | ICD-10-CM | POA: Diagnosis not present

## 2017-06-19 DIAGNOSIS — M779 Enthesopathy, unspecified: Secondary | ICD-10-CM

## 2017-06-19 NOTE — Assessment & Plan Note (Signed)
Pt will see if anyone in [place] does PCNS.  I am happy to make the referral if needed

## 2017-06-19 NOTE — Progress Notes (Signed)
BP 131/74   Pulse 88   Temp 98.7 F (37.1 C)   Wt 187 lb 9.6 oz (85.1 kg)   LMP  (LMP Unknown)   SpO2 96%   BMI 28.11 kg/m    Subjective:    Patient ID: Sharon [name], female    DOB: Mar 01, 1946, 71 y.o.   MRN: [id number]  HPI: Sharon [name] is a 71 y.o. female  Chief Complaint  Patient presents with  . Wrist Pain    2 week f.up, pt states that the brace has helped her wrist   Right wrist and thumb tendonitis.  Pt is wearing a right wrist splint which really helps but needs to take it off a lt to do things.  Pt states she is still swelling and there is still a knot there.   Bladder urgency She is wondering if someone in the county does percutaneous nerve stimulation.    Relevant past medical, surgical, family and social history reviewed and updated as indicated. Interim medical history since our last visit reviewed. Allergies and medications reviewed and updated.  Review of Systems  Per HPI unless specifically indicated above     Objective:    BP 131/74   Pulse 88   Temp 98.7 F (37.1 C)   Wt 187 lb 9.6 oz (85.1 kg)   LMP  (LMP Unknown)   SpO2 96%   BMI 28.11 kg/m   Wt Readings from Last 3 Encounters:  06/19/17 187 lb 9.6 oz (85.1 kg)  06/01/17 183 lb (83 kg)  04/29/17 179 lb (81.2 kg)    Physical Exam  Constitutional: She is oriented to person, place, and time. She appears well-developed and well-nourished. No distress.  HENT:  Head: Normocephalic and atraumatic.  Eyes: Conjunctivae and lids are normal. Right eye exhibits no discharge. Left eye exhibits no discharge. No scleral icterus.  Abdominal: Normal appearance. There is no splenomegaly or hepatomegaly.  Musculoskeletal: Normal range of motion.       Right wrist: She exhibits tenderness and swelling.  Neurological: She is alert and oriented to person, place, and time.  Skin: Skin is intact. No rash noted. No pallor.  Psychiatric: She has a normal mood and affect. Her behavior is normal. Judgment and  thought content normal.    Results for orders placed or performed in visit on 11/14/16  CBC with Differential/Platelet  Result Value Ref Range   WBC 9.3 3.4 - 10.8 x10E3/uL   RBC 4.71 3.77 - 5.28 x10E6/uL   Hemoglobin 14.6 11.1 - 15.9 g/dL   Hematocrit 43.6 34.0 - 46.6 %   MCV 93 79 - 97 fL   MCH 31.0 26.6 - 33.0 pg   MCHC 33.5 31.5 - 35.7 g/dL   RDW 13.4 12.3 - 15.4 %   Platelets 222 150 - 379 x10E3/uL   Neutrophils 72 Not Estab. %   Lymphs 16 Not Estab. %   Monocytes 10 Not Estab. %   Eos 1 Not Estab. %   Basos 0 Not Estab. %   Neutrophils Absolute 6.8 1.4 - 7.0 x10E3/uL   Lymphocytes Absolute 1.5 0.7 - 3.1 x10E3/uL   Monocytes Absolute 0.9 0.1 - 0.9 x10E3/uL   EOS (ABSOLUTE) 0.1 0.0 - 0.4 x10E3/uL   Basophils Absolute 0.0 0.0 - 0.2 x10E3/uL   Immature Granulocytes 1 Not Estab. %   Immature Grans (Abs) 0.1 0.0 - 0.1 x10E3/uL  Comprehensive metabolic panel  Result Value Ref Range   Glucose 116 (H) 65 - 99 mg/dL  BUN 13 8 - 27 mg/dL   Creatinine, Ser 0.84 0.57 - 1.00 mg/dL   GFR calc non Af Amer 71 >59 mL/min/1.73   GFR calc Af Amer 81 >59 mL/min/1.73   BUN/Creatinine Ratio 15 12 - 28   Sodium 140 134 - 144 mmol/L   Potassium 5.0 3.5 - 5.2 mmol/L   Chloride 100 96 - 106 mmol/L   CO2 23 18 - 29 mmol/L   Calcium 9.0 8.7 - 10.3 mg/dL   Total Protein 6.5 6.0 - 8.5 g/dL   Albumin 4.1 3.5 - 4.8 g/dL   Globulin, Total 2.4 1.5 - 4.5 g/dL   Albumin/Globulin Ratio 1.7 1.2 - 2.2   Bilirubin Total 0.5 0.0 - 1.2 mg/dL   Alkaline Phosphatase 80 39 - 117 IU/L   AST 21 0 - 40 IU/L   ALT 25 0 - 32 IU/L  TSH  Result Value Ref Range   TSH 1.030 0.450 - 4.500 uIU/mL      Assessment & Plan:   Problem List Items Addressed This Visit      Unprioritized   Stress incontinence    Pt will see if anyone in [place] does PCNS.  I am happy to make the referral if needed       Other Visit Diagnoses    Wrist tendonitis    -  Primary   Improving.  Continue present therapy   Thumb  tendonitis       as above      Offered PT referral.  Pt refused.    Follow up plan: Return in about 4 weeks (around 07/17/2017), or if symptoms worsen or fail to improve.

## 2017-06-23 DIAGNOSIS — Z1231 Encounter for screening mammogram for malignant neoplasm of breast: Secondary | ICD-10-CM | POA: Diagnosis not present

## 2017-06-24 ENCOUNTER — Encounter: Payer: Self-pay | Admitting: General Surgery

## 2017-07-01 ENCOUNTER — Encounter: Payer: Self-pay | Admitting: General Surgery

## 2017-07-01 ENCOUNTER — Ambulatory Visit (INDEPENDENT_AMBULATORY_CARE_PROVIDER_SITE_OTHER): Payer: Medicare Other | Admitting: General Surgery

## 2017-07-01 VITALS — BP 130/76 | HR 68 | Resp 12 | Ht 69.0 in | Wt 184.0 lb

## 2017-07-01 DIAGNOSIS — N6312 Unspecified lump in the right breast, upper inner quadrant: Secondary | ICD-10-CM | POA: Diagnosis not present

## 2017-07-01 NOTE — Progress Notes (Signed)
Patient ID: Sharon [name], female   DOB: Sep 13, 1946, 71 y.o.   MRN: [id number]  Chief Complaint  Patient presents with  . Follow-up    HPI Sharon [name] is a 71 y.o. female who presents for a breast evaluation. The most recent mammogram was done on 06/23/2017.  Patient does perform regular self breast checks and gets regular mammograms done.    HPI  Past Medical History:  Diagnosis Date  . Anxiety   . Arthritis    hands, upper back  . Asthma   . COPD (chronic obstructive pulmonary disease) ([place])   . History of cervical cancer   . Menopausal disorder   . Osteoporosis   . Pneumonia 1960  . Spasm of abdominal muscles of right side    intermittent  . TMJ (dislocation of temporomandibular joint)   . Wears dentures    partial lower    Past Surgical History:  Procedure Laterality Date  . ABDOMINAL HYSTERECTOMY  1970's  . bladder botox  2005  . BLADDER SUSPENSION  2004  . COLONOSCOPY WITH PROPOFOL N/A 12/13/2015   Procedure: COLONOSCOPY WITH PROPOFOL;  Surgeon: Lucilla Lame, MD;  Location: [facility];  Service: Endoscopy;  Laterality: N/A;  . POLYPECTOMY N/A 12/13/2015   Procedure: POLYPECTOMY;  Surgeon: Lucilla Lame, MD;  Location: [facility];  Service: Endoscopy;  Laterality: N/A;  SIGMOID COLON POLYPS X  5  . SHOULDER ARTHROSCOPY W/ ROTATOR CUFF REPAIR Right 1998  . TONSILLECTOMY AND ADENOIDECTOMY      Family History  Problem Relation Age of Onset  . Diabetes Mother   . Heart disease Mother   . Stroke Mother   . Stroke Maternal Grandmother     Social History Social History  Substance Use Topics  . Smoking status: Former Smoker    Packs/day: 0.60    Years: 56.00    Types: Cigarettes    Quit date: 07/14/2016  . Smokeless tobacco: Never Used     Comment: has smoked off and on  . Alcohol use 0.0 oz/week     Comment: occasionally - special occasions    Allergies  Allergen Reactions  . Librium [Chlordiazepoxide] Itching    Dizziness   . Bactrim  [Sulfamethoxazole-Trimethoprim] Nausea And Vomiting  . Ibuprofen Rash    Mouth swelling  . Latex Rash    Some bandaids, some gloves  . Naprosyn [Naproxen] Rash    Mouth swelling  . Other Rash    [place] nuts - mouth swelling    Current Outpatient Prescriptions  Medication Sig Dispense Refill  . aspirin EC 81 MG tablet Take 81 mg by mouth daily.    Marland Kitchen b complex vitamins capsule Take 1 capsule by mouth daily.    . clonazePAM (KLONOPIN) 0.5 MG tablet Take 1 tablet (0.5 mg total) by mouth daily as needed for anxiety. 20 tablet 1  . estradiol (VIVELLE-DOT) 0.1 MG/24HR patch APPLY 1 PATCH ONTO THE SKIN 2 TIMES A WEEK 8 patch 12  . guaiFENesin (MUCINEX) 600 MG 12 hr tablet Take 1 tablet (600 mg total) by mouth 2 (two) times daily. 30 tablet 0  . meloxicam (MOBIC) 15 MG tablet Take 1 tablet (15 mg total) by mouth daily. 30 tablet 0  . Multiple Vitamin (MULTIVITAMIN) tablet Take 1 tablet by mouth daily.    Marland Kitchen omeprazole (PRILOSEC) 40 MG capsule Take 40 mg by mouth daily.    Marland Kitchen PROAIR HFA 108 (90 Base) MCG/ACT inhaler USE 2 PUFFS EVERY SIX HOURS AS NEEDED FOR  WHEEZING OR SHORTNESS OF BREATH 8.5 Inhaler 12  . SUPER GARLIC PO Take 2 tablets by mouth daily.    . SYMBICORT 160-4.5 MCG/ACT inhaler USE 2 PUFFS TWICE DAILY 10.2 Inhaler 12   Current Facility-Administered Medications  Medication Dose Route Frequency Provider Last Rate Last Dose  . cyanocobalamin ((VITAMIN B-12)) injection 1,000 mcg  1,000 mcg Intramuscular Q30 days Kathrine Haddock, NP   1,000 mcg at 06/11/17 1020    Review of Systems Review of Systems  Constitutional: Negative.   Respiratory: Negative.   Cardiovascular: Negative.     Blood pressure 130/76, pulse 68, resp. rate 12, height 5\' 9"  (1.753 m), weight 184 lb (83.5 kg).  Physical Exam Physical Exam  Constitutional: She is oriented to person, place, and time. She appears well-developed and well-nourished.  Eyes: Conjunctivae are normal. No scleral icterus.  Neck: Neck  supple.  Cardiovascular: Normal rate, regular rhythm and normal heart sounds.   Pulmonary/Chest: Effort normal and breath sounds normal. Right breast exhibits no inverted nipple, no mass, no nipple discharge, no skin change and no tenderness. Left breast exhibits no inverted nipple, no mass, no nipple discharge, no skin change and no tenderness.    Lymphadenopathy:    She has no cervical adenopathy.  Neurological: She is alert and oriented to person, place, and time.  Skin: Skin is warm and dry.    Data Reviewed 06/23/2017 bilateral screening mammograms completed at [facility] were reviewed. No interval change. BI-RADS-2.  Assessment    Benign breast exam.    Plan    Yearly screening mammograms and clinical breast exam with her PCP.    Patient to return as needed. The patient is aware to call back for any questions or concerns.  HPI, Physical Exam, Assessment and Plan have been scribed under the direction and in the presence of [name], MD.  Gaspar Cola, CMA  I have completed the exam and reviewed the above documentation for accuracy and completeness.  I agree with the above.  Haematologist has been used and any errors in dictation or transcription are unintentional.  [name], M.D., F.A.C.S.  Robert Bellow 07/02/2017, 2:53 PM

## 2017-07-01 NOTE — Patient Instructions (Signed)
Return as needed.The patient is aware to call back for any questions or concerns.  

## 2017-07-09 ENCOUNTER — Other Ambulatory Visit: Payer: Self-pay

## 2017-07-10 MED ORDER — ALBUTEROL SULFATE HFA 108 (90 BASE) MCG/ACT IN AERS: 2.0000 | INHALATION_SPRAY | Freq: Four times a day (QID) | RESPIRATORY_TRACT | 12 refills | Status: DC | PRN

## 2017-07-13 ENCOUNTER — Ambulatory Visit (INDEPENDENT_AMBULATORY_CARE_PROVIDER_SITE_OTHER): Payer: Medicare Other

## 2017-07-13 DIAGNOSIS — E538 Deficiency of other specified B group vitamins: Secondary | ICD-10-CM

## 2017-07-13 DIAGNOSIS — K140 Glossitis: Secondary | ICD-10-CM | POA: Diagnosis not present

## 2017-07-15 ENCOUNTER — Other Ambulatory Visit: Payer: Self-pay | Admitting: Unknown Physician Specialty

## 2017-07-17 ENCOUNTER — Ambulatory Visit (INDEPENDENT_AMBULATORY_CARE_PROVIDER_SITE_OTHER): Payer: Medicare Other | Admitting: Unknown Physician Specialty

## 2017-07-17 ENCOUNTER — Encounter: Payer: Self-pay | Admitting: Unknown Physician Specialty

## 2017-07-17 VITALS — BP 125/72 | HR 83 | Temp 98.3°F | Wt 185.4 lb

## 2017-07-17 DIAGNOSIS — M654 Radial styloid tenosynovitis [de Quervain]: Secondary | ICD-10-CM | POA: Diagnosis not present

## 2017-07-17 DIAGNOSIS — E2839 Other primary ovarian failure: Secondary | ICD-10-CM

## 2017-07-17 MED ORDER — GUAIFENESIN ER 600 MG PO TB12: 600.0000 mg | ORAL_TABLET | Freq: Two times a day (BID) | ORAL | 0 refills | Status: DC

## 2017-07-17 NOTE — Patient Instructions (Signed)
Thumb spica splint
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
## 2017-07-17 NOTE — Assessment & Plan Note (Signed)
Another round of Meloxicam.  Will get another splint.

## 2017-07-27 DIAGNOSIS — Z23 Encounter for immunization: Secondary | ICD-10-CM | POA: Diagnosis not present

## 2017-08-05 DIAGNOSIS — Z780 Asymptomatic menopausal state: Secondary | ICD-10-CM | POA: Diagnosis not present

## 2017-08-05 DIAGNOSIS — E2839 Other primary ovarian failure: Secondary | ICD-10-CM | POA: Diagnosis not present

## 2017-08-05 DIAGNOSIS — M8580 Other specified disorders of bone density and structure, unspecified site: Secondary | ICD-10-CM | POA: Diagnosis not present

## 2017-08-05 DIAGNOSIS — M8588 Other specified disorders of bone density and structure, other site: Secondary | ICD-10-CM | POA: Diagnosis not present

## 2017-08-07 ENCOUNTER — Telehealth: Payer: Self-pay

## 2017-08-07 NOTE — Telephone Encounter (Signed)
Received bone density results from Sharon [name]. Malachy Mood wrote a note on the fax that states "Please tell patient this is OK. Walk and take Vitamin D OTC. I don't recommend bone density meds". Called and left patient a VM letting her know [name]'s instructions. Asked for her to call back with any questions or concerns.

## 2017-08-12 ENCOUNTER — Ambulatory Visit (INDEPENDENT_AMBULATORY_CARE_PROVIDER_SITE_OTHER): Payer: Medicare Other

## 2017-08-12 DIAGNOSIS — K140 Glossitis: Secondary | ICD-10-CM

## 2017-08-12 DIAGNOSIS — E538 Deficiency of other specified B group vitamins: Secondary | ICD-10-CM

## 2017-08-14 ENCOUNTER — Encounter: Payer: Self-pay | Admitting: Unknown Physician Specialty

## 2017-08-14 ENCOUNTER — Ambulatory Visit (INDEPENDENT_AMBULATORY_CARE_PROVIDER_SITE_OTHER): Payer: Medicare Other | Admitting: Unknown Physician Specialty

## 2017-08-14 DIAGNOSIS — M654 Radial styloid tenosynovitis [de Quervain]: Secondary | ICD-10-CM | POA: Diagnosis not present

## 2017-08-14 DIAGNOSIS — Z780 Asymptomatic menopausal state: Secondary | ICD-10-CM | POA: Diagnosis not present

## 2017-08-14 DIAGNOSIS — R635 Abnormal weight gain: Secondary | ICD-10-CM

## 2017-08-14 MED ORDER — ESTRADIOL 1 MG PO TABS: 1.0000 mg | ORAL_TABLET | Freq: Every day | ORAL | 3 refills | Status: DC

## 2017-08-14 NOTE — Assessment & Plan Note (Signed)
Discussed weight watchers on-line

## 2017-08-14 NOTE — Assessment & Plan Note (Signed)
Gradual improving

## 2017-08-14 NOTE — Progress Notes (Signed)
BP 136/78   Pulse 87   Temp 98.5 F (36.9 C)   Wt 188 lb 9.6 oz (85.5 kg)   LMP  (LMP Unknown)   SpO2 97%   BMI 27.85 kg/m    Subjective:    Patient ID: Sharon [name], female    DOB: [birth year], 71 y.o.   MRN: [id number]  HPI: Sharon [name] is a 71 y.o. female  Chief Complaint  Patient presents with  . Follow-up  . Medication Problem    pt wants to discuss switching from estadiol patches to tablets   . Loosing Weight    pt wants to discuss loosing weight    Right quervan's  Gradual improvement.    Menopause Wants to switch from patches to pills due to costs  Weight gain Pt states she has never weighed this much.  She states she has a good diet.    Relevant past medical, surgical, family and social history reviewed and updated as indicated. Interim medical history since our last visit reviewed. Allergies and medications reviewed and updated.  Review of Systems  Per HPI unless specifically indicated above     Objective:    BP 136/78   Pulse 87   Temp 98.5 F (36.9 C)   Wt 188 lb 9.6 oz (85.5 kg)   LMP  (LMP Unknown)   SpO2 97%   BMI 27.85 kg/m   Wt Readings from Last 3 Encounters:  08/14/17 188 lb 9.6 oz (85.5 kg)  07/17/17 185 lb 6.4 oz (84.1 kg)  07/01/17 184 lb (83.5 kg)    Physical Exam  Constitutional: She is oriented to person, place, and time. She appears well-developed and well-nourished. No distress.  HENT:  Head: Normocephalic and atraumatic.  Eyes: Conjunctivae and lids are normal. Right eye exhibits no discharge. Left eye exhibits no discharge. No scleral icterus.  Cardiovascular: Normal rate.   Pulmonary/Chest: Effort normal.  Abdominal: Normal appearance. There is no splenomegaly or hepatomegaly.  Musculoskeletal: Normal range of motion.  Neurological: She is alert and oriented to person, place, and time.  Skin: Skin is intact. No rash noted. No pallor.  Psychiatric: She has a normal mood and affect. Her behavior is normal. Judgment  and thought content normal.    Results for orders placed or performed in visit on 11/14/16  CBC with Differential/Platelet  Result Value Ref Range   WBC 9.3 3.4 - 10.8 x10E3/uL   RBC 4.71 3.77 - 5.28 x10E6/uL   Hemoglobin 14.6 11.1 - 15.9 g/dL   Hematocrit 43.6 34.0 - 46.6 %   MCV 93 79 - 97 fL   MCH 31.0 26.6 - 33.0 pg   MCHC 33.5 31.5 - 35.7 g/dL   RDW 13.4 12.3 - 15.4 %   Platelets 222 150 - 379 x10E3/uL   Neutrophils 72 Not Estab. %   Lymphs 16 Not Estab. %   Monocytes 10 Not Estab. %   Eos 1 Not Estab. %   Basos 0 Not Estab. %   Neutrophils Absolute 6.8 1.4 - 7.0 x10E3/uL   Lymphocytes Absolute 1.5 0.7 - 3.1 x10E3/uL   Monocytes Absolute 0.9 0.1 - 0.9 x10E3/uL   EOS (ABSOLUTE) 0.1 0.0 - 0.4 x10E3/uL   Basophils Absolute 0.0 0.0 - 0.2 x10E3/uL   Immature Granulocytes 1 Not Estab. %   Immature Grans (Abs) 0.1 0.0 - 0.1 x10E3/uL  Comprehensive metabolic panel  Result Value Ref Range   Glucose 116 (H) 65 - 99 mg/dL   BUN 13 8 -  27 mg/dL   Creatinine, Ser 0.84 0.57 - 1.00 mg/dL   GFR calc non Af Amer 71 >59 mL/min/1.73   GFR calc Af Amer 81 >59 mL/min/1.73   BUN/Creatinine Ratio 15 12 - 28   Sodium 140 134 - 144 mmol/L   Potassium 5.0 3.5 - 5.2 mmol/L   Chloride 100 96 - 106 mmol/L   CO2 23 18 - 29 mmol/L   Calcium 9.0 8.7 - 10.3 mg/dL   Total Protein 6.5 6.0 - 8.5 g/dL   Albumin 4.1 3.5 - 4.8 g/dL   Globulin, Total 2.4 1.5 - 4.5 g/dL   Albumin/Globulin Ratio 1.7 1.2 - 2.2   Bilirubin Total 0.5 0.0 - 1.2 mg/dL   Alkaline Phosphatase 80 39 - 117 IU/L   AST 21 0 - 40 IU/L   ALT 25 0 - 32 IU/L  TSH  Result Value Ref Range   TSH 1.030 0.450 - 4.500 uIU/mL      Assessment & Plan:   Problem List Items Addressed This Visit      Unprioritized   De Quervain's tenosynovitis, right    Gradual improving      Menopause    Switch from estradiol patches to pills due to cost      Weight gain    Discussed weight watchers on-line          Follow up plan: Return  in about 2 months (around 10/14/2017) for physical.

## 2017-08-14 NOTE — Assessment & Plan Note (Signed)
Switch from estradiol patches to pills due to cost

## 2017-08-28 ENCOUNTER — Other Ambulatory Visit: Payer: Self-pay | Admitting: Unknown Physician Specialty

## 2017-09-11 ENCOUNTER — Ambulatory Visit (INDEPENDENT_AMBULATORY_CARE_PROVIDER_SITE_OTHER): Payer: Medicare Other

## 2017-09-11 DIAGNOSIS — E538 Deficiency of other specified B group vitamins: Secondary | ICD-10-CM

## 2017-09-11 DIAGNOSIS — K140 Glossitis: Secondary | ICD-10-CM | POA: Diagnosis not present

## 2017-10-02 ENCOUNTER — Other Ambulatory Visit: Payer: Self-pay | Admitting: *Deleted

## 2017-10-02 ENCOUNTER — Other Ambulatory Visit: Payer: Self-pay | Admitting: Unknown Physician Specialty

## 2017-10-02 NOTE — Telephone Encounter (Signed)
Wasn't sure whether Dr. Julian Hy wanted this approved or not.   Could not tell from the notes.

## 2017-10-02 NOTE — Telephone Encounter (Signed)
Routing to provider  

## 2017-10-12 ENCOUNTER — Ambulatory Visit (INDEPENDENT_AMBULATORY_CARE_PROVIDER_SITE_OTHER): Payer: Medicare Other

## 2017-10-12 DIAGNOSIS — K140 Glossitis: Secondary | ICD-10-CM

## 2017-10-13 DIAGNOSIS — I639 Cerebral infarction, unspecified: Secondary | ICD-10-CM

## 2017-10-13 HISTORY — DX: Cerebral infarction, unspecified: I63.9

## 2017-10-19 ENCOUNTER — Telehealth: Payer: Self-pay | Admitting: Unknown Physician Specialty

## 2017-10-19 MED ORDER — AMOXICILLIN 875 MG PO TABS: 875.0000 mg | ORAL_TABLET | Freq: Two times a day (BID) | ORAL | 0 refills | Status: DC

## 2017-10-19 NOTE — Telephone Encounter (Signed)
Copied from [place] #[id number]. Topic: Quick Communication - Rx Refill/Question >> Oct 19, 2017  1:26 PM Scherrie Gerlach wrote: Pt states she called [facility] phone this am to request a rx be sent in for her upper resp issue going on. Pt has cough, congestion, difficult to talk. Pt was a little upset that I told her I did not see anything from a call this am. She says all she wants is something for this crud she has.  Pt states she is at the hospital with her husband who was admitted 12/27. Pt has been with him every since.  [facility] [address], [place] [phone] (Phone) [phone] (Fax)

## 2017-10-19 NOTE — Telephone Encounter (Signed)
Routing to provider  

## 2017-10-19 NOTE — Telephone Encounter (Signed)
Called and let patient know that a prescription was sent in for her.

## 2017-10-19 NOTE — Telephone Encounter (Signed)
I will rx her Amoxil

## 2017-10-23 ENCOUNTER — Encounter: Payer: Medicare Other | Admitting: Unknown Physician Specialty

## 2017-10-30 ENCOUNTER — Ambulatory Visit: Payer: Self-pay

## 2017-10-30 NOTE — Telephone Encounter (Signed)
Pt. Called to report she just finished a 10 day Rx of Amoxicillin, and is not feeling any better.  Reported continued congested nasal passages and productive cough.  C/o post nasal drip, especially at night.  Reported her secretions have changed from green to almost clear.  Stated the cough at night is very bothersome; c/o intermittent wheezing.  Stated she is not really worse, but feels she is not getting better. Stated she had intermittent  fever within past 12 days; denied fever at this time.  Reported she uses Inhalers for mild emphysema, and does not feel she has labored breathing, or shortness of breath.  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  Per protocol, scheduled an office visit for 11/02/17.  Care advice given per protocol.  Verbalized understanding.    Reason for Disposition . [1] Nasal discharge AND [2] present > 10 days  Answer Assessment - Initial Assessment Questions 1. ONSET: "When did the cough begin?"      10/17/17 2. SEVERITY: "How bad is the cough today?"      Mild to Moderate 3. RESPIRATORY DISTRESS: "Describe your breathing."      Using Inhalers regularly; feels like breathing is okay 4. FEVER: "Do you have a fever?" If so, ask: "What is your temperature, how was it measured, and when did it start?"     Intermittent fever about within past 12 days; afebrile the past few days. 5. SPUTUM: "Describe the color of your sputum" (clear, white, yellow, green)    Less colored as it was; more clear, but thick 6. HEMOPTYSIS: "Are you coughing up any blood?" If so ask: "How much?" (flecks, streaks, tablespoons, etc.)     no 7. CARDIAC HISTORY: "Do you have any history of heart disease?" (e.g., heart attack, congestive heart failure)      no 8. LUNG HISTORY: "Do you have any history of lung disease?"  (e.g., pulmonary embolus, asthma, emphysema)     emphysema  9. PE RISK FACTORS: "Do you have a history of blood clots?" (or:  recent major surgery, recent prolonged travel, bedridden )     no 10. OTHER SYMPTOMS: "Do you have any other symptoms?" (e.g., runny nose, wheezing, chest pain)       Nasal drainage, productive cough, intermittent wheezing; denied chest discomfort  11. PREGNANCY: "Is there any chance you are pregnant?" "When was your last menstrual period?"       No 12. TRAVEL: "Have you traveled out of the country in the last month?" (e.g., travel history, exposures)       No  Protocols used: [place]

## 2017-11-02 ENCOUNTER — Ambulatory Visit (INDEPENDENT_AMBULATORY_CARE_PROVIDER_SITE_OTHER): Payer: Medicare Other | Admitting: Family Medicine

## 2017-11-02 ENCOUNTER — Encounter: Payer: Self-pay | Admitting: Family Medicine

## 2017-11-02 VITALS — BP 124/69 | HR 71 | Temp 98.5°F | Wt 184.2 lb

## 2017-11-02 DIAGNOSIS — J069 Acute upper respiratory infection, unspecified: Secondary | ICD-10-CM

## 2017-11-02 MED ORDER — GUAIFENESIN ER 600 MG PO TB12: 600.0000 mg | ORAL_TABLET | Freq: Two times a day (BID) | ORAL | 0 refills | Status: DC

## 2017-11-02 MED ORDER — DOXYCYCLINE HYCLATE 100 MG PO TABS: 100.0000 mg | ORAL_TABLET | Freq: Two times a day (BID) | ORAL | 0 refills | Status: DC

## 2017-11-02 NOTE — Progress Notes (Signed)
BP 124/69 (BP Location: Right Arm, Patient Position: Sitting, Cuff Size: Normal)   Pulse 71   Temp 98.5 F (36.9 C) (Oral)   Wt 184 lb 3.2 oz (83.6 kg)   LMP  (LMP Unknown)   SpO2 98%   BMI 27.20 kg/m    Subjective:    Patient ID: Sharon [name], female    DOB: [birth year], 72 y.o.   MRN: [id number]  HPI: Sharon [name] is a 72 y.o. female  Chief Complaint  Patient presents with  . Cough    A little better, coughing up green. Ongoing since January 10/17/17. Not sleeping at night.   . Nasal Congestion   Ongoing productive cough, congestion, fatigue x 2.5 weeks. Did have fevers last week but nothing recently. Denies CP, SOB, N/V. Was given amoxil initially which she states hasn't even touched it. Taking mucinex and using regular inhaler regimen for her COPD of prn albuterol and BID symbicort.  Husband recently hospitalized with pneumonia.   Past Medical History:  Diagnosis Date  . Anxiety   . Arthritis    hands, upper back  . Asthma   . COPD (chronic obstructive pulmonary disease) ([place])   . History of cervical cancer   . Menopausal disorder   . Osteoporosis   . Pneumonia 1960  . Spasm of abdominal muscles of right side    intermittent  . TMJ (dislocation of temporomandibular joint)   . Wears dentures    partial lower   Social History   Socioeconomic History  . Marital status: Married    Spouse name: Not on file  . Number of children: Not on file  . Years of education: Not on file  . Highest education level: Not on file  Social Needs  . Financial resource strain: Not on file  . Food insecurity - worry: Not on file  . Food insecurity - inability: Not on file  . Transportation needs - medical: Not on file  . Transportation needs - non-medical: Not on file  Occupational History  . Not on file  Tobacco Use  . Smoking status: Former Smoker    Packs/day: 0.60    Years: 56.00    Pack years: 33.60    Types: Cigarettes    Last attempt to quit: 07/14/2016    Years  since quitting: 1.3  . Smokeless tobacco: Never Used  . Tobacco comment: has smoked off and on  Substance and Sexual Activity  . Alcohol use: Yes    Alcohol/week: 0.0 oz    Comment: occasionally - special occasions  . Drug use: No  . Sexual activity: Not Currently    Birth control/protection: Post-menopausal  Other Topics Concern  . Not on file  Social History Narrative  . Not on file   Relevant past medical, surgical, family and social history reviewed and updated as indicated. Interim medical history since our last visit reviewed. Allergies and medications reviewed and updated.  Review of Systems  Constitutional: Positive for fatigue and fever.  HENT: Positive for congestion.   Eyes: Negative.   Respiratory: Positive for cough.   Cardiovascular: Negative.   Gastrointestinal: Negative.   Genitourinary: Negative.   Musculoskeletal: Negative.   Skin: Negative.   Neurological: Negative.   Psychiatric/Behavioral: Negative.     Per HPI unless specifically indicated above     Objective:    BP 124/69 (BP Location: Right Arm, Patient Position: Sitting, Cuff Size: Normal)   Pulse 71   Temp 98.5 F (36.9 C) (Oral)  Wt 184 lb 3.2 oz (83.6 kg)   LMP  (LMP Unknown)   SpO2 98%   BMI 27.20 kg/m   Wt Readings from Last 3 Encounters:  11/02/17 184 lb 3.2 oz (83.6 kg)  08/14/17 188 lb 9.6 oz (85.5 kg)  07/17/17 185 lb 6.4 oz (84.1 kg)    Physical Exam  Constitutional: She is oriented to person, place, and time. She appears well-developed and well-nourished. No distress.  HENT:  Head: Atraumatic.  Nasal mucosa and oropharynx erythematous with thick drainage present  Eyes: Conjunctivae are normal. Pupils are equal, round, and reactive to light. No scleral icterus.  Neck: Normal range of motion. Neck supple.  Cardiovascular: Normal rate, regular rhythm and normal heart sounds.  Pulmonary/Chest: Effort normal and breath sounds normal. No respiratory distress. She has no  wheezes. She has no rales.  Musculoskeletal: Normal range of motion.  Neurological: She is alert and oriented to person, place, and time.  Skin: Skin is warm and dry.  Psychiatric: She has a normal mood and affect. Her behavior is normal.  Nursing note and vitals reviewed.  Results for orders placed or performed in visit on 11/14/16  CBC with Differential/Platelet  Result Value Ref Range   WBC 9.3 3.4 - 10.8 x10E3/uL   RBC 4.71 3.77 - 5.28 x10E6/uL   Hemoglobin 14.6 11.1 - 15.9 g/dL   Hematocrit 43.6 34.0 - 46.6 %   MCV 93 79 - 97 fL   MCH 31.0 26.6 - 33.0 pg   MCHC 33.5 31.5 - 35.7 g/dL   RDW 13.4 12.3 - 15.4 %   Platelets 222 150 - 379 x10E3/uL   Neutrophils 72 Not Estab. %   Lymphs 16 Not Estab. %   Monocytes 10 Not Estab. %   Eos 1 Not Estab. %   Basos 0 Not Estab. %   Neutrophils Absolute 6.8 1.4 - 7.0 x10E3/uL   Lymphocytes Absolute 1.5 0.7 - 3.1 x10E3/uL   Monocytes Absolute 0.9 0.1 - 0.9 x10E3/uL   EOS (ABSOLUTE) 0.1 0.0 - 0.4 x10E3/uL   Basophils Absolute 0.0 0.0 - 0.2 x10E3/uL   Immature Granulocytes 1 Not Estab. %   Immature Grans (Abs) 0.1 0.0 - 0.1 x10E3/uL  Comprehensive metabolic panel  Result Value Ref Range   Glucose 116 (H) 65 - 99 mg/dL   BUN 13 8 - 27 mg/dL   Creatinine, Ser 0.84 0.57 - 1.00 mg/dL   GFR calc non Af Amer 71 >59 mL/min/1.73   GFR calc Af Amer 81 >59 mL/min/1.73   BUN/Creatinine Ratio 15 12 - 28   Sodium 140 134 - 144 mmol/L   Potassium 5.0 3.5 - 5.2 mmol/L   Chloride 100 96 - 106 mmol/L   CO2 23 18 - 29 mmol/L   Calcium 9.0 8.7 - 10.3 mg/dL   Total Protein 6.5 6.0 - 8.5 g/dL   Albumin 4.1 3.5 - 4.8 g/dL   Globulin, Total 2.4 1.5 - 4.5 g/dL   Albumin/Globulin Ratio 1.7 1.2 - 2.2   Bilirubin Total 0.5 0.0 - 1.2 mg/dL   Alkaline Phosphatase 80 39 - 117 IU/L   AST 21 0 - 40 IU/L   ALT 25 0 - 32 IU/L  TSH  Result Value Ref Range   TSH 1.030 0.450 - 4.500 uIU/mL      Assessment & Plan:   Problem List Items Addressed This Visit      None    Visit Diagnoses    Upper respiratory tract infection, unspecified type    -  Primary   Will treat with doxycycline and mucinex. Continue regular inhaler regimen. 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

## 2017-11-02 NOTE — Patient Instructions (Signed)
Follow up as needed

## 2017-11-13 ENCOUNTER — Ambulatory Visit (INDEPENDENT_AMBULATORY_CARE_PROVIDER_SITE_OTHER): Payer: Medicare Other

## 2017-11-13 DIAGNOSIS — K140 Glossitis: Secondary | ICD-10-CM | POA: Diagnosis not present

## 2017-11-13 DIAGNOSIS — E538 Deficiency of other specified B group vitamins: Secondary | ICD-10-CM

## 2017-11-25 ENCOUNTER — Ambulatory Visit (INDEPENDENT_AMBULATORY_CARE_PROVIDER_SITE_OTHER): Payer: Medicare Other | Admitting: Unknown Physician Specialty

## 2017-11-25 ENCOUNTER — Encounter: Payer: Self-pay | Admitting: Unknown Physician Specialty

## 2017-11-25 VITALS — BP 137/75 | HR 71 | Temp 98.2°F | Ht 67.0 in | Wt 184.1 lb

## 2017-11-25 DIAGNOSIS — R079 Chest pain, unspecified: Secondary | ICD-10-CM

## 2017-11-25 DIAGNOSIS — G5702 Lesion of sciatic nerve, left lower limb: Secondary | ICD-10-CM

## 2017-11-25 DIAGNOSIS — M654 Radial styloid tenosynovitis [de Quervain]: Secondary | ICD-10-CM | POA: Diagnosis not present

## 2017-11-25 DIAGNOSIS — Z636 Dependent relative needing care at home: Secondary | ICD-10-CM

## 2017-11-25 DIAGNOSIS — J449 Chronic obstructive pulmonary disease, unspecified: Secondary | ICD-10-CM | POA: Diagnosis not present

## 2017-11-25 DIAGNOSIS — Z7189 Other specified counseling: Secondary | ICD-10-CM

## 2017-11-25 DIAGNOSIS — I2510 Atherosclerotic heart disease of native coronary artery without angina pectoris: Secondary | ICD-10-CM | POA: Diagnosis not present

## 2017-11-25 MED ORDER — TIOTROPIUM BROMIDE MONOHYDRATE 2.5 MCG/ACT IN AERS: 2.0000 | INHALATION_SPRAY | Freq: Every day | RESPIRATORY_TRACT | 12 refills | Status: DC

## 2017-11-25 NOTE — Assessment & Plan Note (Signed)
Discussed and demonstrated exercises.  Refer to PT if no benefit

## 2017-11-25 NOTE — Assessment & Plan Note (Addendum)
Symbicort causing problems.  Change to anti-cholinergic only with Proair prn.

## 2017-11-25 NOTE — Assessment & Plan Note (Signed)
A voluntary discussion about advance care planning including the explanation and discussion of advance directives was extensively discussed  with the patient.  Explanation about the health care proxy and Living will was reviewed and packet with forms with explanation of how to fill them out was given.  During this discussion, the patient was able to identify a health care proxy as her son and plans to fill out the paperwork required.  Patient was offered a separate [place] visit for further assistance with forms.  Time spent: 20 minutes   Individuals present: self

## 2017-11-25 NOTE — Assessment & Plan Note (Signed)
Pt is frustrated and unable to wear brace.  She would like a referral to Orthopedics to see if anything else can be done

## 2017-11-25 NOTE — Assessment & Plan Note (Addendum)
On CT 2018. Saw Dr. Fletcher Anon with negative workout.  Take an ASA/day.  Check lipid panel . Low threshhold for starting statins.

## 2017-11-25 NOTE — Progress Notes (Addendum)
BP 137/75 (BP Location: Left Arm, Patient Position: Sitting, Cuff Size: Normal)   Pulse 71   Temp 98.2 F (36.8 C)   Ht 5\' 7"  (1.702 m)   Wt 184 lb 2 oz (83.5 kg)   LMP  (LMP Unknown)   SpO2 98%   BMI 28.84 kg/m    Subjective:    Patient ID: Sharon [name], female    DOB: [birth year], 72 y.o.   MRN: [id number]  HPI: Sharon [name] is a 72 y.o. female  Chief Complaint  Patient presents with  . Annual Exam   Due for AWV this spring.  Regular office visit today  Chief complaint:   Having a lot of stress with care giving responsibilities for her husband.  What is frustrating to her is that her husband is not compliant.    Depression screen [facility] 2/9 11/25/2017 03/13/2017 10/17/2016 10/17/2015  Decreased Interest 0 0 0 0  Down, Depressed, Hopeless 1 1 1  0  PHQ - 2 Score 1 1 1  0  Altered sleeping 1 - - -  Tired, decreased energy 0 - - -  Change in appetite 0 - - -  Feeling bad or failure about yourself  0 - - -  Trouble concentrating 0 - - -  Moving slowly or fidgety/restless 0 - - -  Suicidal thoughts 0 - - -  PHQ-9 Score 2 - - -   COPD Feels symptoms are well controlled.  Sometimes feels sick after taking the Symbicort.  Sometimes makes her chest hurt.   Using medications without problems Night time symptoms: none Increased cough:no Increased SOB: No Quit smoking 2 years ago  Hip pain Pt with hip pain Points to the back of her hip.  Walking and going up steps make it worse.    Quervan's tendonitis This has been a problem for her.  Unable to wear her splint due to care giving duties.    Relevant past medical, surgical, family and social history reviewed and updated as indicated. Interim medical history since our last visit reviewed. Allergies and medications reviewed and updated.  Review of Systems  Constitutional: Negative.   HENT:       Hoarseness and coughing phlegm on occasion  Eyes: Negative.   Cardiovascular: Positive for chest pain.       Chest pain comes and  goes.  Thinks it's related to stress  Gastrointestinal: Negative.   Psychiatric/Behavioral: Negative.     Per HPI unless specifically indicated above     Objective:    BP 137/75 (BP Location: Left Arm, Patient Position: Sitting, Cuff Size: Normal)   Pulse 71   Temp 98.2 F (36.8 C)   Ht 5\' 7"  (1.702 m)   Wt 184 lb 2 oz (83.5 kg)   LMP  (LMP Unknown)   SpO2 98%   BMI 28.84 kg/m   Wt Readings from Last 3 Encounters:  11/25/17 184 lb 2 oz (83.5 kg)  11/02/17 184 lb 3.2 oz (83.6 kg)  08/14/17 188 lb 9.6 oz (85.5 kg)    Physical Exam  Constitutional: She is oriented to person, place, and time. She appears well-developed and well-nourished.  HENT:  Head: Normocephalic and atraumatic.  Eyes: Pupils are equal, round, and reactive to light. Right eye exhibits no discharge. Left eye exhibits no discharge. No scleral icterus.  Neck: Normal range of motion. Neck supple. Carotid bruit is not present. No thyromegaly present.  Cardiovascular: Normal rate, regular rhythm and normal heart sounds. Exam reveals no  gallop and no friction rub.  No murmur heard. Pulmonary/Chest: Effort normal and breath sounds normal. No respiratory distress. She has no wheezes. She has no rales.  Abdominal: Soft. Bowel sounds are normal. There is no tenderness. There is no rebound.  Genitourinary: No breast swelling, tenderness or discharge.  Musculoskeletal: Normal range of motion.  Lymphadenopathy:    She has no cervical adenopathy.  Neurological: She is alert and oriented to person, place, and time.  Skin: Skin is warm, dry and intact. No rash noted.  Psychiatric: She has a normal mood and affect. Her speech is normal and behavior is normal. Judgment and thought content normal. Cognition and memory are normal.   EKG: Normal sinus rhythm.  No acute changes  Results for orders placed or performed in visit on 11/14/16  CBC with Differential/Platelet  Result Value Ref Range   WBC 9.3 3.4 - 10.8 x10E3/uL    RBC 4.71 3.77 - 5.28 x10E6/uL   Hemoglobin 14.6 11.1 - 15.9 g/dL   Hematocrit 43.6 34.0 - 46.6 %   MCV 93 79 - 97 fL   MCH 31.0 26.6 - 33.0 pg   MCHC 33.5 31.5 - 35.7 g/dL   RDW 13.4 12.3 - 15.4 %   Platelets 222 150 - 379 x10E3/uL   Neutrophils 72 Not Estab. %   Lymphs 16 Not Estab. %   Monocytes 10 Not Estab. %   Eos 1 Not Estab. %   Basos 0 Not Estab. %   Neutrophils Absolute 6.8 1.4 - 7.0 x10E3/uL   Lymphocytes Absolute 1.5 0.7 - 3.1 x10E3/uL   Monocytes Absolute 0.9 0.1 - 0.9 x10E3/uL   EOS (ABSOLUTE) 0.1 0.0 - 0.4 x10E3/uL   Basophils Absolute 0.0 0.0 - 0.2 x10E3/uL   Immature Granulocytes 1 Not Estab. %   Immature Grans (Abs) 0.1 0.0 - 0.1 x10E3/uL  Comprehensive metabolic panel  Result Value Ref Range   Glucose 116 (H) 65 - 99 mg/dL   BUN 13 8 - 27 mg/dL   Creatinine, Ser 0.84 0.57 - 1.00 mg/dL   GFR calc non Af Amer 71 >59 mL/min/1.73   GFR calc Af Amer 81 >59 mL/min/1.73   BUN/Creatinine Ratio 15 12 - 28   Sodium 140 134 - 144 mmol/L   Potassium 5.0 3.5 - 5.2 mmol/L   Chloride 100 96 - 106 mmol/L   CO2 23 18 - 29 mmol/L   Calcium 9.0 8.7 - 10.3 mg/dL   Total Protein 6.5 6.0 - 8.5 g/dL   Albumin 4.1 3.5 - 4.8 g/dL   Globulin, Total 2.4 1.5 - 4.5 g/dL   Albumin/Globulin Ratio 1.7 1.2 - 2.2   Bilirubin Total 0.5 0.0 - 1.2 mg/dL   Alkaline Phosphatase 80 39 - 117 IU/L   AST 21 0 - 40 IU/L   ALT 25 0 - 32 IU/L  TSH  Result Value Ref Range   TSH 1.030 0.450 - 4.500 uIU/mL      Assessment & Plan:   Problem List Items Addressed This Visit      Unprioritized   Advanced care planning/counseling discussion    A voluntary discussion about advance care planning including the explanation and discussion of advance directives was extensively discussed  with the patient.  Explanation about the health care proxy and Living will was reviewed and packet with forms with explanation of how to fill them out was given.  During this discussion, the patient was able to identify a  health care proxy as her son and  plans to fill out the paperwork required.  Patient was offered a separate [place] visit for further assistance with forms.  Time spent: 20 minutes   Individuals present: self       CAD (coronary artery disease)    On CT 2018. Saw Dr. Fletcher Anon with negative workout.  Take an ASA/day.  Check lipid panel . Low threshhold for starting statins.        Relevant Orders   EKG 12-Lead (Completed)   Lipid Panel w/o Chol/HDL Ratio   Caregiver stress    Discussed relationship of stress and physical health.  Discussed ways to reduce guilt and mental burdens.        COPD (chronic obstructive pulmonary disease) ([facility])    Symbicort causing problems.  Change to anti-cholinergic only with Proair prn.        Relevant Medications   Tiotropium Bromide Monohydrate (SPIRIVA RESPIMAT) 2.5 MCG/ACT AERS   De Quervain's tenosynovitis, right    Pt is frustrated and unable to wear brace.  She would like a referral to Orthopedics to see if anything else can be done      Relevant Orders   Ambulatory referral to Orthopedic Surgery   Piriformis syndrome, left    Discussed and demonstrated exercises.  Refer to PT if no benefit       Other Visit Diagnoses    Chest pain, unspecified type    -  Primary   Relevant Orders   EKG 12-Lead (Completed)   Comprehensive metabolic panel   Lipid Panel w/o Chol/HDL Ratio      Greater than 50% of this  60 minute visit was spent in counseling/coordination of care regarding stress and demonstration of physical exercises.      Follow up plan: Return in about 4 weeks (around 12/23/2017).

## 2017-11-25 NOTE — Assessment & Plan Note (Signed)
Discussed relationship of stress and physical health.  Discussed ways to reduce guilt and mental burdens.

## 2017-11-26 LAB — COMPREHENSIVE METABOLIC PANEL
ALK PHOS: 70 IU/L (ref 39–117)
ALT: 14 IU/L (ref 0–32)
AST: 21 IU/L (ref 0–40)
Albumin/Globulin Ratio: 1.7 (ref 1.2–2.2)
Albumin: 4.4 g/dL (ref 3.5–4.8)
BILIRUBIN TOTAL: 0.4 mg/dL (ref 0.0–1.2)
BUN / CREAT RATIO: 12 (ref 12–28)
BUN: 10 mg/dL (ref 8–27)
CHLORIDE: 100 mmol/L (ref 96–106)
CO2: 26 mmol/L (ref 20–29)
CREATININE: 0.82 mg/dL (ref 0.57–1.00)
Calcium: 9.2 mg/dL (ref 8.7–10.3)
GFR calc Af Amer: 83 mL/min/{1.73_m2} (ref >59)
GFR calc non Af Amer: 72 mL/min/{1.73_m2} (ref >59)
GLUCOSE: 101 mg/dL — AB (ref 65–99)
Globulin, Total: 2.6 g/dL (ref 1.5–4.5)
Potassium: 4.3 mmol/L (ref 3.5–5.2)
Sodium: 140 mmol/L (ref 134–144)
Total Protein: 7 g/dL (ref 6.0–8.5)

## 2017-11-26 LAB — LIPID PANEL W/O CHOL/HDL RATIO
CHOLESTEROL TOTAL: 191 mg/dL (ref 100–199)
HDL: 63 mg/dL (ref >39)
LDL CALC: 109 mg/dL — AB (ref 0–99)
TRIGLYCERIDES: 94 mg/dL (ref 0–149)
VLDL CHOLESTEROL CAL: 19 mg/dL (ref 5–40)

## 2017-11-26 NOTE — Progress Notes (Signed)
Normal labs.  Pt notified through mychart

## 2017-12-09 DIAGNOSIS — M19031 Primary osteoarthritis, right wrist: Secondary | ICD-10-CM | POA: Diagnosis not present

## 2017-12-09 DIAGNOSIS — M19039 Primary osteoarthritis, unspecified wrist: Secondary | ICD-10-CM | POA: Diagnosis not present

## 2017-12-15 ENCOUNTER — Ambulatory Visit (INDEPENDENT_AMBULATORY_CARE_PROVIDER_SITE_OTHER): Payer: Medicare Other

## 2017-12-15 DIAGNOSIS — K140 Glossitis: Secondary | ICD-10-CM | POA: Diagnosis not present

## 2017-12-18 DIAGNOSIS — H2513 Age-related nuclear cataract, bilateral: Secondary | ICD-10-CM | POA: Diagnosis not present

## 2017-12-23 ENCOUNTER — Ambulatory Visit: Payer: Medicare Other | Admitting: Unknown Physician Specialty

## 2017-12-31 DIAGNOSIS — M19031 Primary osteoarthritis, right wrist: Secondary | ICD-10-CM | POA: Diagnosis not present

## 2018-01-04 ENCOUNTER — Other Ambulatory Visit: Payer: Self-pay

## 2018-01-04 ENCOUNTER — Encounter: Payer: Self-pay | Admitting: Neurology

## 2018-01-04 ENCOUNTER — Emergency Department: Payer: Medicare Other

## 2018-01-04 ENCOUNTER — Observation Stay (HOSPITAL_BASED_OUTPATIENT_CLINIC_OR_DEPARTMENT_OTHER)
Admission: EM | Admit: 2018-01-04 | Discharge: 2018-01-06 | Disposition: A | Discharge status: Home / Self Care | Payer: Medicare Other | Source: Home / Self Care | Attending: Emergency Medicine | Admitting: Emergency Medicine

## 2018-01-04 ENCOUNTER — Encounter: Payer: Self-pay | Admitting: Internal Medicine

## 2018-01-04 DIAGNOSIS — Z7982 Long term (current) use of aspirin: Secondary | ICD-10-CM

## 2018-01-04 DIAGNOSIS — Z91040 Latex allergy status: Secondary | ICD-10-CM

## 2018-01-04 DIAGNOSIS — I6310 Cerebral infarction due to embolism of unspecified precerebral artery: Secondary | ICD-10-CM

## 2018-01-04 DIAGNOSIS — M1990 Unspecified osteoarthritis, unspecified site: Secondary | ICD-10-CM

## 2018-01-04 DIAGNOSIS — F419 Anxiety disorder, unspecified: Secondary | ICD-10-CM | POA: Insufficient documentation

## 2018-01-04 DIAGNOSIS — R260 Ataxic gait: Secondary | ICD-10-CM | POA: Insufficient documentation

## 2018-01-04 DIAGNOSIS — Z87891 Personal history of nicotine dependence: Secondary | ICD-10-CM | POA: Insufficient documentation

## 2018-01-04 DIAGNOSIS — Z7951 Long term (current) use of inhaled steroids: Secondary | ICD-10-CM

## 2018-01-04 DIAGNOSIS — R4781 Slurred speech: Secondary | ICD-10-CM | POA: Insufficient documentation

## 2018-01-04 DIAGNOSIS — R29810 Facial weakness: Secondary | ICD-10-CM | POA: Insufficient documentation

## 2018-01-04 DIAGNOSIS — M810 Age-related osteoporosis without current pathological fracture: Secondary | ICD-10-CM | POA: Insufficient documentation

## 2018-01-04 DIAGNOSIS — G8324 Monoplegia of upper limb affecting left nondominant side: Secondary | ICD-10-CM | POA: Insufficient documentation

## 2018-01-04 DIAGNOSIS — G459 Transient cerebral ischemic attack, unspecified: Secondary | ICD-10-CM | POA: Diagnosis present

## 2018-01-04 DIAGNOSIS — K140 Glossitis: Secondary | ICD-10-CM | POA: Diagnosis not present

## 2018-01-04 DIAGNOSIS — I69354 Hemiplegia and hemiparesis following cerebral infarction affecting left non-dominant side: Secondary | ICD-10-CM | POA: Diagnosis not present

## 2018-01-04 DIAGNOSIS — Z882 Allergy status to sulfonamides status: Secondary | ICD-10-CM

## 2018-01-04 DIAGNOSIS — I69320 Aphasia following cerebral infarction: Secondary | ICD-10-CM | POA: Diagnosis not present

## 2018-01-04 DIAGNOSIS — I2510 Atherosclerotic heart disease of native coronary artery without angina pectoris: Secondary | ICD-10-CM | POA: Insufficient documentation

## 2018-01-04 DIAGNOSIS — J449 Chronic obstructive pulmonary disease, unspecified: Secondary | ICD-10-CM

## 2018-01-04 DIAGNOSIS — N179 Acute kidney failure, unspecified: Secondary | ICD-10-CM | POA: Diagnosis not present

## 2018-01-04 DIAGNOSIS — I69398 Other sequelae of cerebral infarction: Secondary | ICD-10-CM | POA: Diagnosis not present

## 2018-01-04 DIAGNOSIS — Z8673 Personal history of transient ischemic attack (TIA), and cerebral infarction without residual deficits: Secondary | ICD-10-CM

## 2018-01-04 LAB — COMPREHENSIVE METABOLIC PANEL
ALBUMIN: 4.2 g/dL (ref 3.5–5.0)
ALK PHOS: 67 U/L (ref 38–126)
ALT: 30 U/L (ref 14–54)
ANION GAP: 10 (ref 5–15)
AST: 27 U/L (ref 15–41)
BILIRUBIN TOTAL: 0.6 mg/dL (ref 0.3–1.2)
BUN: 21 mg/dL — ABNORMAL HIGH (ref 6–20)
CALCIUM: 9.1 mg/dL (ref 8.9–10.3)
CO2: 23 mmol/L (ref 22–32)
Chloride: 103 mmol/L (ref 101–111)
Creatinine, Ser: 0.94 mg/dL (ref 0.44–1.00)
GFR calc non Af Amer: 59 mL/min — ABNORMAL LOW (ref >60)
GLUCOSE: 128 mg/dL — AB (ref 65–99)
Potassium: 4.2 mmol/L (ref 3.5–5.1)
SODIUM: 136 mmol/L (ref 135–145)
TOTAL PROTEIN: 7.4 g/dL (ref 6.5–8.1)

## 2018-01-04 LAB — DIFFERENTIAL
Basophils Absolute: 0 10*3/uL (ref 0–0.1)
Basophils Relative: 0 %
EOS ABS: 0.1 10*3/uL (ref 0–0.7)
EOS PCT: 1 %
LYMPHS PCT: 17 %
Lymphs Abs: 1.7 10*3/uL (ref 1.0–3.6)
MONO ABS: 0.9 10*3/uL (ref 0.2–0.9)
Monocytes Relative: 9 %
NEUTROS PCT: 73 %
Neutro Abs: 7.2 10*3/uL — ABNORMAL HIGH (ref 1.4–6.5)

## 2018-01-04 LAB — PROTIME-INR
INR: 0.89
PROTHROMBIN TIME: 12 s (ref 11.4–15.2)

## 2018-01-04 LAB — CBC
HCT: 42.4 % (ref 35.0–47.0)
Hemoglobin: 14.4 g/dL (ref 12.0–16.0)
MCH: 31.1 pg (ref 26.0–34.0)
MCHC: 33.9 g/dL (ref 32.0–36.0)
MCV: 91.7 fL (ref 80.0–100.0)
PLATELETS: 237 10*3/uL (ref 150–440)
RBC: 4.63 MIL/uL (ref 3.80–5.20)
RDW: 13.6 % (ref 11.5–14.5)
WBC: 10 10*3/uL (ref 3.6–11.0)

## 2018-01-04 LAB — TROPONIN I: Troponin I: <0.03 ng/mL (ref <0.03)

## 2018-01-04 LAB — GLUCOSE, CAPILLARY: GLUCOSE-CAPILLARY: 114 mg/dL — AB (ref 65–99)

## 2018-01-04 LAB — APTT: aPTT: 25 seconds (ref 24–36)

## 2018-01-04 MED ORDER — ACETAMINOPHEN 160 MG/5ML PO SOLN
650.0000 mg | ORAL | Status: DC | PRN
  Filled 2018-01-04: qty 20.3

## 2018-01-04 MED ORDER — ACETAMINOPHEN 650 MG RE SUPP: 650.0000 mg | RECTAL | Status: DC | PRN

## 2018-01-04 MED ORDER — ASPIRIN 300 MG RE SUPP: 300.0000 mg | Freq: Every day | RECTAL | Status: DC

## 2018-01-04 MED ORDER — ENOXAPARIN SODIUM 40 MG/0.4ML ~~LOC~~ SOLN
40.0000 mg | SUBCUTANEOUS | Status: DC
  Administered 2018-01-04: 40 mg via SUBCUTANEOUS
  Filled 2018-01-04 (×2): qty 0.4

## 2018-01-04 MED ORDER — RENA-VITE PO TABS
1.0000 | ORAL_TABLET | Freq: Every day | ORAL | Status: DC
  Administered 2018-01-05: 11:00:00 1 via ORAL
  Filled 2018-01-04 (×2): qty 1

## 2018-01-04 MED ORDER — PANTOPRAZOLE SODIUM 40 MG PO TBEC
40.0000 mg | DELAYED_RELEASE_TABLET | Freq: Every day | ORAL | Status: DC
  Administered 2018-01-05: 11:00:00 40 mg via ORAL
  Filled 2018-01-04: qty 1

## 2018-01-04 MED ORDER — ALBUTEROL SULFATE (2.5 MG/3ML) 0.083% IN NEBU: 2.5000 mg | INHALATION_SOLUTION | Freq: Four times a day (QID) | RESPIRATORY_TRACT | Status: DC | PRN

## 2018-01-04 MED ORDER — CYANOCOBALAMIN 1000 MCG/ML IJ SOLN
1000.0000 ug | INTRAMUSCULAR | Status: DC
  Administered 2018-01-04: 1000 ug via INTRAMUSCULAR
  Filled 2018-01-04: qty 1

## 2018-01-04 MED ORDER — ASPIRIN EC 325 MG PO TBEC
325.0000 mg | DELAYED_RELEASE_TABLET | Freq: Every day | ORAL | Status: DC
  Administered 2018-01-04 – 2018-01-05 (×2): 325 mg via ORAL
  Filled 2018-01-04 (×2): qty 1

## 2018-01-04 MED ORDER — STROKE: EARLY STAGES OF RECOVERY BOOK
Freq: Once | Status: AC
  Administered 2018-01-04: 21:00:00

## 2018-01-04 MED ORDER — ACETAMINOPHEN 325 MG PO TABS: 650.0000 mg | ORAL_TABLET | ORAL | Status: DC | PRN

## 2018-01-04 MED ORDER — MOMETASONE FURO-FORMOTEROL FUM 200-5 MCG/ACT IN AERO
2.0000 | INHALATION_SPRAY | Freq: Two times a day (BID) | RESPIRATORY_TRACT | Status: DC
  Administered 2018-01-04 – 2018-01-05 (×3): 2 via RESPIRATORY_TRACT
  Filled 2018-01-04: qty 8.8

## 2018-01-04 MED ORDER — GUAIFENESIN ER 600 MG PO TB12
600.0000 mg | ORAL_TABLET | Freq: Two times a day (BID) | ORAL | Status: DC
  Administered 2018-01-04 – 2018-01-05 (×3): 600 mg via ORAL
  Filled 2018-01-04 (×3): qty 1

## 2018-01-04 MED ORDER — HYDROXYZINE HCL 50 MG/ML IM SOLN
25.0000 mg | Freq: Once | INTRAMUSCULAR | Status: AC
  Administered 2018-01-04: 25 mg via INTRAMUSCULAR
  Filled 2018-01-04: qty 0.5

## 2018-01-04 MED ORDER — VITAMIN D 1000 UNITS PO TABS
2000.0000 [IU] | ORAL_TABLET | Freq: Every day | ORAL | Status: DC
  Administered 2018-01-05: 11:00:00 2000 [IU] via ORAL
  Filled 2018-01-04: qty 2

## 2018-01-04 MED ORDER — LORAZEPAM 2 MG/ML IJ SOLN
1.0000 mg | INTRAMUSCULAR | Status: AC
  Administered 2018-01-05: 03:00:00 1 mg via INTRAVENOUS
  Filled 2018-01-04: qty 1

## 2018-01-04 NOTE — Code Documentation (Signed)
Pt arrives POV, states at 1520 she had suddenly lost control of her left arm, states she then devloped a headache and left facial tingling, code stroke activated in triage, pt taken from triage to Ct for non-con head CT and then to room 1,upon arrival to room 1 pt states feeling somewhat better, NIHSS 1, no tPA given, symptoms waxing and wanning, symptoms came and went again during tele-neuro eval, report off to [employer]

## 2018-01-04 NOTE — ED Notes (Signed)
This RN taking to inpatient room to do bedside report.

## 2018-01-04 NOTE — Progress Notes (Signed)
   01/04/18 1830  Clinical Encounter Type  Visited With Patient and family together  Visit Type Initial  Referral From Nurse  Consult/Referral To Chaplain  Spiritual Encounters  Spiritual Needs Prayer;Emotional   [place] followed up on a code stroke PG. Pt and her husband were waiting for further test results. [place] prayed with PT and administered ministry of encouragement. [place] will follow up as needed.

## 2018-01-04 NOTE — ED Notes (Addendum)
Pt arrives from CT via wheelchair. States looked at clock at 3:20 and then noticed L sided facial numbness. States slurred speech but at this time pt speech is clear. Pt knows month. Pt states she was eating and her L hand slapped her R hand, states "it was uncontrollable." states then she felt numbness on L side and posterior HA that started before numbness. Pt states tingling in bilat hands that began while she was driving over here. Pt describes it as if hand fell asleep. Pt states her L arm feels weaker than R arm. Denies blood thinner use. Wearing glasses. Took 2 ASA PTA. Normally pt doesn't have peripheral vision. Pt following commands, answering all questions appropriately. Pt states facial numbness is getting better since arriving.   Minette Brine, stroke RN and Dr. Cinda Quest at bedside.    CBG 114

## 2018-01-04 NOTE — ED Notes (Signed)
Pt ambulatory to toilet with 1 assist. No distress noted.

## 2018-01-04 NOTE — ED Notes (Signed)
telecart in room. Awaiting neurologist.

## 2018-01-04 NOTE — ED Notes (Signed)
Admitting at bedside 

## 2018-01-04 NOTE — ED Provider Notes (Signed)
[facility] Emergency Department Provider Note   ____________________________________________   First MD Initiated Contact with Patient 01/04/18 1656     (approximate)  I have reviewed the triage vital signs and the nursing notes.   HISTORY  Chief Complaint Dizziness    HPI Sharon [name] is a 72 y.o. female Patient reports that about 1530 she began feeling dizzy she felt like she was slurring her words. She said her left hand came up and slapped her right hand uncontrollably. She felt numbness and her face worse on the left. She says her left arm feels weaker than the right. She reports she normally doesn't have peripheral vision. She took 2 aspirin prior to arrival.patient also reports a funny feeling in the back of her head that's best described as a headache.   Past Medical History:  Diagnosis Date  . Anxiety   . Arthritis    hands, upper back  . Asthma   . COPD (chronic obstructive pulmonary disease) ([place])   . History of cervical cancer   . Menopausal disorder   . Osteoporosis   . Pneumonia 1960  . Spasm of abdominal muscles of right side    intermittent  . TMJ (dislocation of temporomandibular joint)   . Wears dentures    partial lower    Patient Active Problem List   Diagnosis Date Noted  . Piriformis syndrome, left 11/25/2017  . CAD (coronary artery disease) 11/25/2017  . Caregiver stress 11/25/2017  . Menopause 08/14/2017  . Weight gain 08/14/2017  . De Quervain's tenosynovitis, right 07/17/2017  . Stress incontinence 06/19/2017  . Personal history of tobacco use, presenting hazards to health 04/01/2017  . Anxiety 03/13/2017  . Glossitis 11/14/2016  . Advanced care planning/counseling discussion 10/17/2016  . Other fecal abnormalities   . Benign neoplasm of sigmoid colon   . COPD (chronic obstructive pulmonary disease) ([place]) 10/17/2015  . Mass of upper inner quadrant of right breast 06/22/2015    Past Surgical History:    Procedure Laterality Date  . ABDOMINAL HYSTERECTOMY  1970's  . bladder botox  2005  . BLADDER SUSPENSION  2004  . COLONOSCOPY WITH PROPOFOL N/A 12/13/2015   Procedure: COLONOSCOPY WITH PROPOFOL;  Surgeon: Lucilla Lame, MD;  Location: [place];  Service: Endoscopy;  Laterality: N/A;  . POLYPECTOMY N/A 12/13/2015   Procedure: POLYPECTOMY;  Surgeon: Lucilla Lame, MD;  Location: [place];  Service: Endoscopy;  Laterality: N/A;  SIGMOID COLON POLYPS X  5  . SHOULDER ARTHROSCOPY W/ ROTATOR CUFF REPAIR Right 1998  . TONSILLECTOMY AND ADENOIDECTOMY      Prior to Admission medications   Medication Sig Start Date End Date Taking? Authorizing Provider  albuterol (PROAIR HFA) 108 (90 Base) MCG/ACT inhaler Inhale 2 puffs into the lungs every 6 (six) hours as needed for wheezing or shortness of breath. 07/10/17   Kathrine Haddock, NP  amoxicillin-clavulanate (AUGMENTIN) 500-125 MG tablet  11/12/17   [provider]  aspirin EC 81 MG tablet Take 81 mg by mouth daily.    [provider]  b complex vitamins capsule Take 1 capsule by mouth daily.    [provider]  Cholecalciferol (VITAMIN D) 2000 units tablet Take 2,000 Units by mouth daily.    [provider]  clonazePAM (KLONOPIN) 0.5 MG tablet Take 1 tablet (0.5 mg total) by mouth daily as needed for anxiety. Patient not taking: Reported on 11/02/2017 03/13/17   Kathrine Haddock, NP  estradiol (ESTRACE) 1 MG tablet Take  1 tablet (1 mg total) by mouth daily. 08/14/17   Kathrine Haddock, NP  guaiFENesin (MUCINEX) 600 MG 12 hr tablet Take 1 tablet (600 mg total) by mouth 2 (two) times daily. 11/02/17   Volney American, PA-C  HYDROcodone-acetaminophen (NORCO/VICODIN) 5-325 MG tablet  11/12/17   [provider]  Multiple Vitamin (MULTIVITAMIN) tablet Take 1 tablet by mouth daily.    [provider]  omeprazole (PRILOSEC) 40 MG capsule Take 40 mg by mouth daily. 03/02/17   [provider]   SUPER GARLIC PO Take 2 tablets by mouth daily.    [provider]  Tiotropium Bromide Monohydrate (SPIRIVA RESPIMAT) 2.5 MCG/ACT AERS Inhale 2 puffs into the lungs daily. 11/25/17   Kathrine Haddock, NP    Allergies Librium [chlordiazepoxide]; Bactrim [sulfamethoxazole-trimethoprim]; Ibuprofen; Latex; Naprosyn [naproxen]; and Other  Family History  Problem Relation Age of Onset  . Diabetes Mother   . Heart disease Mother   . Stroke Mother   . Stroke Maternal Grandmother     Social History Social History   Tobacco Use  . Smoking status: Former Smoker    Packs/day: 0.60    Years: 56.00    Pack years: 33.60    Types: Cigarettes    Last attempt to quit: 07/14/2016    Years since quitting: 1.4  . Smokeless tobacco: Never Used  . Tobacco comment: has smoked off and on  Substance Use Topics  . Alcohol use: Yes    Alcohol/week: 0.0 oz    Comment: occasionally - special occasions  . Drug use: No    Review of Systems  Constitutional: No fever/chills Eyes: No visual changes. ENT: No sore throat. Cardiovascular: Denies chest pain. Respiratory: Denies shortness of breath. Gastrointestinal: No abdominal pain.  No nausea, no vomiting.  No diarrhea.  No constipation. Genitourinary: Negative for dysuria. Musculoskeletal: Negative for back pain. Skin: Negative for rash. Neurological:see history of present illness .  ____________________________________________   PHYSICAL EXAM:  VITAL SIGNS: ED Triage Vitals  Enc Vitals Group     BP 01/04/18 1638 (!) 164/71     Pulse Rate 01/04/18 1638 78     Resp 01/04/18 1638 20     Temp 01/04/18 1638 98.1 F (36.7 C)     Temp Source 01/04/18 1638 Oral     SpO2 01/04/18 1638 99 %     Weight 01/04/18 1639 190 lb (86.2 kg)     Height 01/04/18 1639 5\' 9"  (1.753 m)     Head Circumference --      Peak Flow --      Pain Score 01/04/18 1639 0     Pain Loc --      Pain Edu? --      Excl. in [place]? --     Constitutional: Alert and  oriented. Well appearing and in no acute distress. Eyes: Conjunctivae are normal. PER. EOMI. Head: Atraumatic. Nose: No congestion/rhinnorhea. Mouth/Throat: Mucous membranes are moist.  Oropharynx non-erythematous. Neck: No stridor.  Cardiovascular: Normal rate, regular rhythm. Grossly normal heart sounds.  Good peripheral circulation. Respiratory: Normal respiratory effort.  No retractions. Lungs CTAB. Gastrointestinal: Soft and nontender. No distention. No abdominal bruits. No CVA tenderness. Musculoskeletal: No lower extremity tenderness nor edema.  s. Neurologic:  Normal speech and language. patient has a slight deficit in the smile on her left side tongue drift slightly to the left minutes per treated and rapid alternating movements in the hands are slightly slower on the left. She does not have palmar drift  in the legs appear to be strong bilaterally Skin:  Skin is warm, dry and intact. No rash noted. Psychiatric: Mood and affect are normal. Speech and behavior are normal.  ____________________________________________   LABS (all labs ordered are listed, but only abnormal results are displayed)  Labs Reviewed  DIFFERENTIAL - Abnormal; Notable for the following components:      Result Value   Neutro Abs 7.2 (*)    All other components within normal limits  COMPREHENSIVE METABOLIC PANEL - Abnormal; Notable for the following components:   Glucose, Bld 128 (*)    BUN 21 (*)    GFR calc non Af Amer 59 (*)    All other components within normal limits  GLUCOSE, CAPILLARY - Abnormal; Notable for the following components:   Glucose-Capillary 114 (*)    All other components within normal limits  PROTIME-INR  APTT  CBC  TROPONIN I  CBG MONITORING, ED   ____________________________________________  EKG  KG read and interpreted by me shows normal sinus rhythm rate of 81 normal axis there is one PAC with pause no acute ST-T wave  changes ____________________________________________  RADIOLOGY  ED MD interpretation: radiologist calls and says the head CT is negative  Official radiology report(s): Ct Head Code Stroke Wo Contrast  Result Date: 01/04/2018 CLINICAL DATA:  Code stroke.  Slurred speech.  Facial numbness. EXAM: CT HEAD WITHOUT CONTRAST TECHNIQUE: Contiguous axial images were obtained from the base of the skull through the vertex without intravenous contrast. COMPARISON:  None. FINDINGS: Brain: No evidence of acute infarction, hemorrhage, hydrocephalus, extra-axial collection or mass lesion/mass effect. Normal for age cerebral volume. No significant white matter disease. Vascular: Calcification of the cavernous internal carotid arteries consistent with cerebrovascular atherosclerotic disease. No signs of intracranial large vessel occlusion. Skull: Normal. Negative for fracture or focal lesion. Sinuses/Orbits: No acute finding. Other: None. ASPECTS [facility] Stroke Program Early CT Score) - Ganglionic level infarction (caudate, lentiform nuclei, internal capsule, insula, M1-M3 cortex): 7 - Supraganglionic infarction (M4-M6 cortex): 3 Total score (0-10 with 10 being normal): 10 IMPRESSION: 1. Unremarkable CT head without contrast. 2. ASPECTS is 10. A call was placed to the emergency department by myself on 01/04/2018 at 4:58 p.m. Findings were conveyed to Dr. Cinda Quest. Electronically Signed   By: Staci Righter M.D.   On: 01/04/2018 17:01    ____________________________________________   PROCEDURES  Procedure(s) performed:   Procedures  Critical Care performed:   ____________________________________________   INITIAL IMPRESSION / ASSESSMENT AND PLAN / ED COURSE  telephone neurology sees the patientand recommends admission.         ____________________________________________   FINAL CLINICAL IMPRESSION(S) / ED DIAGNOSES  Final diagnoses:  TIA (transient ischemic attack)     ED Discharge  Orders    None       Note:  This document was prepared using Dragon voice recognition software and may include unintentional dictation errors.    Nena Polio, MD 01/04/18 Bosie Helper

## 2018-01-04 NOTE — Progress Notes (Signed)
Advanced care plan.  Purpose of the Encounter: CODE STATUS  Parties in Attendance: Patient and family  Patient's Decision Capacity: Good  Subjective/Patient's story: presented with slurred speech, left hand weakness    Objective/Medical story : Initial CT head ok Stroke w/u with mri brain, carotid USD and echo    Goals of care determination: Advanced directives discussed Patient wants cardiac resuscitation, intubation and ventilator if the need arises    CODE STATUS: Full code   Time spent discussing advanced care planning: 16 minutes

## 2018-01-04 NOTE — H&P (Signed)
[place] [facility] [place] NAME: Sharon [name]    MR#:  [id number]  DATE OF BIRTH:  [birth year]  DATE OF ADMISSION:  01/04/2018  PRIMARY CARE PHYSICIAN: Kathrine Haddock, NP   REQUESTING/REFERRING PHYSICIAN:   CHIEF COMPLAINT:   Chief Complaint  Patient presents with  . Dizziness    HISTORY OF PRESENT ILLNESS: Sharon [name]  is a 72 y.o. female with a known history of COPD, osteoporosis, anxiety disorder presented to the emergency room for slurred speech and weakness in the index finger and thumb of left hand.  Patient noticed elbow symptoms later this morning.  Speech is improved after presentation to the emergency room.  She also says she had a facial droop in the morning but that has resolved after presentation to the emergency room.  Patient has difficulty in balancing and gait which is new.  She was evaluated with CT head in the emergency room which showed no acute intracranial abnormality.  No no history of any witnessed seizure.  No headache.  Had some dizziness.  Hospitalist service was consulted for further care.  PAST MEDICAL HISTORY:   Past Medical History:  Diagnosis Date  . Anxiety   . Arthritis    hands, upper back  . Asthma   . COPD (chronic obstructive pulmonary disease) ([place])   . History of cervical cancer   . Menopausal disorder   . Osteoporosis   . Pneumonia 1960  . Spasm of abdominal muscles of right side    intermittent  . TMJ (dislocation of temporomandibular joint)   . Wears dentures    partial lower    PAST SURGICAL HISTORY:  Past Surgical History:  Procedure Laterality Date  . ABDOMINAL HYSTERECTOMY  1970's  . bladder botox  2005  . BLADDER SUSPENSION  2004  . COLONOSCOPY WITH PROPOFOL N/A 12/13/2015   Procedure: COLONOSCOPY WITH PROPOFOL;  Surgeon: Lucilla Lame, MD;  Location: [place];  Service: Endoscopy;  Laterality: N/A;  . POLYPECTOMY N/A 12/13/2015   Procedure: POLYPECTOMY;  Surgeon: Lucilla Lame, MD;  Location: [place];  Service: Endoscopy;  Laterality: N/A;  SIGMOID COLON POLYPS X  5  . SHOULDER ARTHROSCOPY W/ ROTATOR CUFF REPAIR Right 1998  . TONSILLECTOMY AND ADENOIDECTOMY      SOCIAL HISTORY:  Social History   Tobacco Use  . Smoking status: Former Smoker    Packs/day: 0.60    Years: 56.00    Pack years: 33.60    Types: Cigarettes    Last attempt to quit: 07/14/2016    Years since quitting: 1.4  . Smokeless tobacco: Never Used  . Tobacco comment: has smoked off and on  Substance Use Topics  . Alcohol use: Yes    Alcohol/week: 0.0 oz    Comment: occasionally - special occasions    FAMILY HISTORY:  Family History  Problem Relation Age of Onset  . Diabetes Mother   . Heart disease Mother   . Stroke Mother   . Stroke Maternal Grandmother     DRUG ALLERGIES:  Allergies  Allergen Reactions  . Librium [Chlordiazepoxide] Itching    Dizziness   . Bactrim [Sulfamethoxazole-Trimethoprim] Nausea And Vomiting  . Ibuprofen Rash    Mouth swelling  . Latex Rash    Some bandaids, some gloves  . Naprosyn [Naproxen] Rash    Mouth swelling  . Other Rash    [place] nuts - mouth swelling    REVIEW OF SYSTEMS:   CONSTITUTIONAL: No fever,  fatigue or weakness.  EYES: No blurred or double vision.  EARS, NOSE, AND THROAT: No tinnitus or ear pain.  RESPIRATORY: No cough, shortness of breath, wheezing or hemoptysis.  CARDIOVASCULAR: No chest pain, orthopnea, edema.  GASTROINTESTINAL: No nausea, vomiting, diarrhea or abdominal pain.  GENITOURINARY: No dysuria, hematuria.  ENDOCRINE: No polyuria, nocturia,  HEMATOLOGY: No anemia, easy bruising or bleeding SKIN: No rash or lesion. MUSCULOSKELETAL: No joint pain or arthritis.   NEUROLOGIC: No tingling, numbness,  Had weakness left hand Had dizziness Had slurred speech PSYCHIATRY: No anxiety or depression.   MEDICATIONS AT HOME:  Prior to Admission medications   Medication Sig Start Date End Date  Taking? Authorizing Provider  albuterol (PROAIR HFA) 108 (90 Base) MCG/ACT inhaler Inhale 2 puffs into the lungs every 6 (six) hours as needed for wheezing or shortness of breath. 07/10/17  Yes Kathrine Haddock, NP  aspirin EC 81 MG tablet Take 81 mg by mouth every other day.    Yes [provider]  b complex vitamins capsule Take 1 capsule by mouth daily.   Yes [provider]  budesonide-formoterol (SYMBICORT) 160-4.5 MCG/ACT inhaler Inhale 2 puffs into the lungs 2 (two) times daily.   Yes [provider]  Cholecalciferol (VITAMIN D) 2000 units tablet Take 2,000 Units by mouth daily.   Yes [provider]  Cyanocobalamin (B-12 PO) Take 1 tablet by mouth daily.   Yes [provider]  Cyanocobalamin 1000 MCG/ML KIT Inject 1,000 mcg as directed every 30 (thirty) days.   Yes [provider]  estradiol (CLIMARA - DOSED IN MG/24 HR) 0.1 mg/24hr patch Place 0.1 mg onto the skin 2 (two) times a week.   Yes [provider]  guaiFENesin (MUCINEX) 600 MG 12 hr tablet Take 1 tablet (600 mg total) by mouth 2 (two) times daily. 11/02/17  Yes Volney American, PA-C  MELOXICAM PO Take 1 tablet by mouth daily.   Yes [provider]  Multiple Vitamin (MULTIVITAMIN) tablet Take 2 tablets by mouth daily.    Yes [provider]  omeprazole (PRILOSEC) 40 MG capsule Take 40 mg by mouth daily. 03/02/17  Yes [provider]  SUPER GARLIC PO Take 2 tablets by mouth daily.   Yes [provider]  clonazePAM (KLONOPIN) 0.5 MG tablet Take 1 tablet (0.5 mg total) by mouth daily as needed for anxiety. Patient not taking: Reported on 11/02/2017 03/13/17   Kathrine Haddock, NP  estradiol (ESTRACE) 1 MG tablet Take 1 tablet (1 mg total) by mouth daily. Patient not taking: Reported on 01/04/2018 08/14/17   Kathrine Haddock, NP  Tiotropium Bromide Monohydrate (SPIRIVA RESPIMAT) 2.5 MCG/ACT AERS Inhale 2 puffs into the lungs daily. Patient not  taking: Reported on 01/04/2018 11/25/17   Kathrine Haddock, NP      PHYSICAL EXAMINATION:   VITAL SIGNS: Blood pressure (!) 126/59, pulse 87, temperature 97.8 F (36.6 C), resp. rate 18, height 5' 9"  (1.753 m), weight 86.2 kg (190 lb), SpO2 96 %.  GENERAL:  72 y.o.-year-old patient lying in the bed with no acute distress.  EYES: Pupils equal, round, reactive to light and accommodation. No scleral icterus. Extraocular muscles intact.  HEENT: Head atraumatic, normocephalic. Oropharynx and nasopharynx clear.  NECK:  Supple, no jugular venous distention. No thyroid enlargement, no tenderness.  LUNGS: Normal breath sounds bilaterally, no wheezing, rales,rhonchi or crepitation. No use of accessory muscles of respiration.  CARDIOVASCULAR: S1, S2 normal. No murmurs, rubs, or gallops.  ABDOMEN: Soft, nontender, nondistended. Bowel sounds  present. No organomegaly or mass.  EXTREMITIES: No pedal edema, cyanosis, or clubbing.  NEUROLOGIC: Cranial nerves II through XII are intact. Muscle strength 5/5 in all extremities except 4/5 left upper extremity. Sensation intact. Gait not checked.  PSYCHIATRIC: The patient is alert and oriented x 3.  SKIN: No obvious rash, lesion, or ulcer.   LABORATORY PANEL:   CBC Recent Labs  Lab 01/04/18 1655  WBC 10.0  HGB 14.4  HCT 42.4  PLT 237  MCV 91.7  MCH 31.1  MCHC 33.9  RDW 13.6  LYMPHSABS 1.7  MONOABS 0.9  EOSABS 0.1  BASOSABS 0.0   ------------------------------------------------------------------------------------------------------------------  Chemistries  Recent Labs  Lab 01/04/18 1655  NA 136  K 4.2  CL 103  CO2 23  GLUCOSE 128*  BUN 21*  CREATININE 0.94  CALCIUM 9.1  AST 27  ALT 30  ALKPHOS 67  BILITOT 0.6   ------------------------------------------------------------------------------------------------------------------ estimated creatinine clearance is 63.4 mL/min (by C-G formula based on SCr of 0.94  mg/dL). ------------------------------------------------------------------------------------------------------------------ No results for input(s): TSH, T4TOTAL, T3FREE, THYROIDAB in the last 72 hours.  Invalid input(s): FREET3   Coagulation profile Recent Labs  Lab 01/04/18 1655  INR 0.89   ------------------------------------------------------------------------------------------------------------------- No results for input(s): DDIMER in the last 72 hours. -------------------------------------------------------------------------------------------------------------------  Cardiac Enzymes Recent Labs  Lab 01/04/18 1655  TROPONINI <0.03   ------------------------------------------------------------------------------------------------------------------ Invalid input(s): POCBNP  ---------------------------------------------------------------------------------------------------------------  Urinalysis    Component Value Date/Time   APPEARANCEUR Clear 10/17/2016 1421   GLUCOSEU Negative 10/17/2016 1421   BILIRUBINUR Negative 10/17/2016 1421   PROTEINUR Negative 10/17/2016 1421   NITRITE Negative 10/17/2016 1421   LEUKOCYTESUR Negative 10/17/2016 1421     RADIOLOGY: Ct Head Code Stroke Wo Contrast  Result Date: 01/04/2018 CLINICAL DATA:  Code stroke.  Slurred speech.  Facial numbness. EXAM: CT HEAD WITHOUT CONTRAST TECHNIQUE: Contiguous axial images were obtained from the base of the skull through the vertex without intravenous contrast. COMPARISON:  None. FINDINGS: Brain: No evidence of acute infarction, hemorrhage, hydrocephalus, extra-axial collection or mass lesion/mass effect. Normal for age cerebral volume. No significant white matter disease. Vascular: Calcification of the cavernous internal carotid arteries consistent with cerebrovascular atherosclerotic disease. No signs of intracranial large vessel occlusion. Skull: Normal. Negative for fracture or focal lesion.  Sinuses/Orbits: No acute finding. Other: None. ASPECTS [facility] Stroke Program Early CT Score) - Ganglionic level infarction (caudate, lentiform nuclei, internal capsule, insula, M1-M3 cortex): 7 - Supraganglionic infarction (M4-M6 cortex): 3 Total score (0-10 with 10 being normal): 10 IMPRESSION: 1. Unremarkable CT head without contrast. 2. ASPECTS is 10. A call was placed to the emergency department by myself on 01/04/2018 at 4:58 p.m. Findings were conveyed to Dr. Cinda Quest. Electronically Signed   By: Staci Righter M.D.   On: 01/04/2018 17:01    EKG: Orders placed or performed during the hospital encounter of 01/04/18  . ED EKG  . ED EKG  . EKG 12-Lead  . EKG 12-Lead    IMPRESSION AND PLAN:  72 year old elderly female patient with history of osteoporosis, emphysema, anxiety disorder presented to the emergency room with weakness in the left hand and slurred speech.  1.  Transient ischemic attack Admit patient to observation bed medical floor Aspirin orally 325 mg Check MRI and MRA brain Carotid ultrasound to rule out obstruction Echocardiogram Neurology consultation  2.  Emphysema Stable  3.  Osteoporosis Calcium supplements  4.  DVT prophylaxis with Lovenox subcutaneously  5.  Gait ataxia MRI and MRA brain  All the records are reviewed  and case discussed with ED provider. Management plans discussed with the patient, family and they are in agreement.  CODE STATUS:Full code    TOTAL TIME TAKING CARE OF THIS PATIENT: 47 minutes.    Saundra Shelling M.D on 01/04/2018 at 7:37 PM  Between 7am to 6pm - Pager - [phone]  After 6pm go to [email or web address] - password [email or web address] [email or web address] [facility]  Office  [phone]  CC: Primary care physician; Kathrine Haddock, NP

## 2018-01-04 NOTE — Progress Notes (Signed)
Regarding code stroke: RN Anda Kraft and Elige Ko informed please let pharmacist know if I can be of assistance.    Thomasenia Sales, PharmD, MBA, [facility]

## 2018-01-04 NOTE — ED Notes (Signed)
While testing pt sensory pt states "I feel like something's not right. My asked this RN if speech was slurred." speech sounds like if pt had something in mouth or if she had been given numbing medicine in one side of face. Denies feeling hot. Appears anxious during this time.

## 2018-01-04 NOTE — ED Triage Notes (Signed)
States began feeling dizzy approx 1530. States feels like she is slurring her words. Words are clear and coherent without hesitation or slur at this point. Smile is symmetrical. States mouth feels numb like when "you go to a dentist and your mouth starts to wake up. Grips and leg strength are equal. States while she was eating earlier that her left hand came over and hit her R hand without her doing it on purpose.

## 2018-01-04 NOTE — ED Notes (Signed)
Neurologist on screen. Pt states that slurred speech to her lasted 3-4 minutes at home. No slurred speech noted at current. Pt still feels numbness to L side of face (but is getting better) and states numbness in hands still. Pt states during slurred speech at home she felt like it was hard to find her words. Pt is talking in complete sentences.

## 2018-01-05 ENCOUNTER — Observation Stay: Payer: Medicare Other

## 2018-01-05 ENCOUNTER — Observation Stay
Admit: 2018-01-05 | Discharge: 2018-01-05 | Disposition: A | Discharge status: Home / Self Care | Payer: Medicare Other | Attending: Internal Medicine | Admitting: Internal Medicine

## 2018-01-05 ENCOUNTER — Other Ambulatory Visit: Payer: Self-pay

## 2018-01-05 DIAGNOSIS — I6310 Cerebral infarction due to embolism of unspecified precerebral artery: Secondary | ICD-10-CM

## 2018-01-05 DIAGNOSIS — I639 Cerebral infarction, unspecified: Secondary | ICD-10-CM

## 2018-01-05 DIAGNOSIS — Z8673 Personal history of transient ischemic attack (TIA), and cerebral infarction without residual deficits: Secondary | ICD-10-CM

## 2018-01-05 DIAGNOSIS — I6523 Occlusion and stenosis of bilateral carotid arteries: Secondary | ICD-10-CM | POA: Diagnosis not present

## 2018-01-05 DIAGNOSIS — J449 Chronic obstructive pulmonary disease, unspecified: Secondary | ICD-10-CM | POA: Diagnosis not present

## 2018-01-05 DIAGNOSIS — R4781 Slurred speech: Secondary | ICD-10-CM | POA: Diagnosis not present

## 2018-01-05 DIAGNOSIS — R260 Ataxic gait: Secondary | ICD-10-CM | POA: Diagnosis not present

## 2018-01-05 DIAGNOSIS — M810 Age-related osteoporosis without current pathological fracture: Secondary | ICD-10-CM | POA: Diagnosis not present

## 2018-01-05 DIAGNOSIS — G459 Transient cerebral ischemic attack, unspecified: Secondary | ICD-10-CM | POA: Diagnosis not present

## 2018-01-05 LAB — ECHOCARDIOGRAM COMPLETE
Height: 69 in
WEIGHTICAEL: 3040 [oz_av]

## 2018-01-05 LAB — LIPID PANEL
CHOL/HDL RATIO: 3.5 ratio
Cholesterol: 191 mg/dL (ref 0–200)
HDL: 55 mg/dL (ref >40)
LDL CALC: 125 mg/dL — AB (ref 0–99)
Triglycerides: 54 mg/dL (ref <150)
VLDL: 11 mg/dL (ref 0–40)

## 2018-01-05 MED ORDER — ATORVASTATIN CALCIUM 20 MG PO TABS
40.0000 mg | ORAL_TABLET | Freq: Every day | ORAL | Status: DC
  Administered 2018-01-05: 17:00:00 40 mg via ORAL
  Filled 2018-01-05: qty 2

## 2018-01-05 MED ORDER — ASPIRIN EC 81 MG PO TBEC: 81.0000 mg | DELAYED_RELEASE_TABLET | Freq: Every day | ORAL | Status: DC

## 2018-01-05 MED ORDER — SODIUM CHLORIDE 0.9 % IV SOLN: INTRAVENOUS | Status: DC

## 2018-01-05 MED ORDER — CLOPIDOGREL BISULFATE 75 MG PO TABS: 75.0000 mg | ORAL_TABLET | Freq: Every day | ORAL | Status: DC

## 2018-01-05 NOTE — Progress Notes (Signed)
[place] [facility] NAME: Sharon [name]    MR#:  [id number]  DATE OF BIRTH:  Apr 08, 1946  SUBJECTIVE:  CHIEF COMPLAINT:   Chief Complaint  Patient presents with  . Dizziness   Patient symptoms have improved significantly.  Husband at bedside.  Afebrile.  REVIEW OF SYSTEMS:    Review of Systems  Constitutional: Positive for malaise/fatigue. Negative for chills and fever.  HENT: Negative for sore throat.   Eyes: Negative for blurred vision, double vision and pain.  Respiratory: Negative for cough, hemoptysis, shortness of breath and wheezing.   Cardiovascular: Negative for chest pain, palpitations, orthopnea and leg swelling.  Gastrointestinal: Negative for abdominal pain, constipation, diarrhea, heartburn, nausea and vomiting.  Genitourinary: Negative for dysuria and hematuria.  Musculoskeletal: Negative for back pain and joint pain.  Skin: Negative for rash.  Neurological: Negative for sensory change, speech change, focal weakness and headaches.  Endo/Heme/Allergies: Does not bruise/bleed easily.  Psychiatric/Behavioral: Negative for depression. The patient is not nervous/anxious.     DRUG ALLERGIES:   Allergies  Allergen Reactions  . Librium [Chlordiazepoxide] Itching    Dizziness   . Bactrim [Sulfamethoxazole-Trimethoprim] Nausea And Vomiting  . Ibuprofen Rash    Mouth swelling  . Latex Rash    Some bandaids, some gloves  . Naprosyn [Naproxen] Rash    Mouth swelling  . Other Rash    [place] nuts - mouth swelling    VITALS:  Blood pressure (!) 108/52, pulse 70, temperature 98.1 F (36.7 C), temperature source Oral, resp. rate 20, height 5\' 9"  (1.753 m), weight 86.2 kg (190 lb), SpO2 96 %.  PHYSICAL EXAMINATION:   Physical Exam  GENERAL:  72 y.o.-year-old patient lying in the bed with no acute distress.  EYES: Pupils equal, round, reactive to light and accommodation. No scleral icterus. Extraocular muscles intact.   HEENT: Head atraumatic, normocephalic. Oropharynx and nasopharynx clear.  NECK:  Supple, no jugular venous distention. No thyroid enlargement, no tenderness.  LUNGS: Normal breath sounds bilaterally, no wheezing, rales, rhonchi. No use of accessory muscles of respiration.  CARDIOVASCULAR: S1, S2 normal. No murmurs, rubs, or gallops.  ABDOMEN: Soft, nontender, nondistended. Bowel sounds present. No organomegaly or mass.  EXTREMITIES: No cyanosis, clubbing or edema b/l.    NEUROLOGIC: Cranial nerves II through XII are intact. No focal Motor or sensory deficits b/l.   PSYCHIATRIC: The patient is alert and oriented x 3.  SKIN: No obvious rash, lesion, or ulcer.   LABORATORY PANEL:   CBC Recent Labs  Lab 01/04/18 1655  WBC 10.0  HGB 14.4  HCT 42.4  PLT 237   ------------------------------------------------------------------------------------------------------------------ Chemistries  Recent Labs  Lab 01/04/18 1655  NA 136  K 4.2  CL 103  CO2 23  GLUCOSE 128*  BUN 21*  CREATININE 0.94  CALCIUM 9.1  AST 27  ALT 30  ALKPHOS 67  BILITOT 0.6   ------------------------------------------------------------------------------------------------------------------  Cardiac Enzymes Recent Labs  Lab 01/04/18 1655  TROPONINI <0.03   ------------------------------------------------------------------------------------------------------------------  RADIOLOGY:  Mr Brain Wo Contrast  Result Date: 01/05/2018 CLINICAL DATA:  Slurred speech and left hand weakness EXAM: MRI HEAD WITHOUT CONTRAST MRA HEAD WITHOUT CONTRAST TECHNIQUE: Multiplanar, multiecho pulse sequences of the brain and surrounding structures were obtained without intravenous contrast. Angiographic images of the head were obtained using MRA technique without contrast. COMPARISON:  Head CT 01/04/2018 FINDINGS: MRI HEAD FINDINGS Brain: The midline structures are normal. There is multifocal abnormal diffusion restriction within  the right frontal lobe,  including the precentral gyrus and superior frontal gyrus/anterior paracentral gyrus. The latter focus is in the anterior cerebral artery distribution. The others are in the MCA distribution. Very mild multifocal white matter hyperintensity. Some of these foci related to the areas of acute ischemia. Others are nonspecific and commonly seen at this age. No mass lesion. No chronic microhemorrhage or cerebral amyloid angiopathy. No hydrocephalus, age advanced atrophy or lobar predominant volume loss. No dural abnormality or extra-axial collection. Skull and upper cervical spine: The visualized skull base, calvarium, upper cervical spine and extracranial soft tissues are normal. Sinuses/Orbits: Right mastoid effusion. Paranasal sinuses are clear. Normal orbits. MRA HEAD FINDINGS Intracranial internal carotid arteries: Normal. Anterior cerebral arteries: Normal. Middle cerebral arteries: Normal right MCA. There is severe proximal stenosis of the most anterior M2 branch of the left MCA (series 7 image 62), with normal distal flow-related enhancement. No clear correlate for this is seen on the maximum intensity projections. Posterior communicating arteries: Absent Posterior cerebral arteries: Normal. Basilar artery: Normal. Vertebral arteries: Left dominant. Normal. Superior cerebellar arteries: Normal. Anterior inferior cerebellar arteries: Normal. Posterior inferior cerebellar arteries: Normal. IMPRESSION: 1. Multiple punctate foci of acute ischemia within the right frontal lobe, including the precentral gyrus, which is in the right MCA territory, and the left superior frontal 4 anterior paracentral gyrus, both of which are in the anterior cerebral artery distribution. The involvement of multiple vascular territories on the right suggest a right-sided embolic source. 2. No hemorrhage or mass effect. 3. No emergent large vessel occlusion. 4. Severe stenosis of the anterior most left M2 branch of  the MCA, with normal distal flow-related enhancement. Electronically Signed   By: Ulyses Jarred M.D.   On: 01/05/2018 03:56   US Carotid Bilateral (at [facility] Only)  Result Date: 01/05/2018 CLINICAL DATA:  TIA symptoms EXAM: BILATERAL CAROTID DUPLEX ULTRASOUND TECHNIQUE: Pearline Cables scale imaging, color Doppler and duplex ultrasound were performed of bilateral carotid and vertebral arteries in the neck. COMPARISON:  01/05/2018 MRI FINDINGS: Criteria: Quantification of carotid stenosis is based on velocity parameters that correlate the residual internal carotid diameter with NASCET-based stenosis levels, using the diameter of the distal internal carotid lumen as the denominator for stenosis measurement. The following velocity measurements were obtained: RIGHT ICA:  63/11 cm/sec CCA:  00/9 cm/sec SYSTOLIC ICA/CCA RATIO:  0.8 DIASTOLIC ICA/CCA RATIO:  1.2 ECA:  71 cm/sec LEFT ICA:  75/20 cm/sec CCA:  38/18 cm/sec SYSTOLIC ICA/CCA RATIO:  0.9 DIASTOLIC ICA/CCA RATIO:  1.7 ECA:  102 cm/sec RIGHT CAROTID ARTERY: Minor echogenic shadowing plaque formation. No hemodynamically significant right ICA stenosis, velocity elevation, or turbulent flow. Degree of narrowing less than 50%. RIGHT VERTEBRAL ARTERY:  Antegrade LEFT CAROTID ARTERY: Similar scattered minor echogenic plaque formation. No hemodynamically significant left ICA stenosis, velocity elevation, or turbulent flow. LEFT VERTEBRAL ARTERY:  Antegrade IMPRESSION: Minor carotid atherosclerosis. No hemodynamically significant ICA stenosis. Degree of narrowing less than 50% bilaterally by ultrasound criteria. Patent antegrade vertebral flow bilaterally Electronically Signed   By: Jerilynn Mages.  Shick M.D.   On: 01/05/2018 10:19   Mr Jodene Nam Head/brain EX Cm  Result Date: 01/05/2018 CLINICAL DATA:  Slurred speech and left hand weakness EXAM: MRI HEAD WITHOUT CONTRAST MRA HEAD WITHOUT CONTRAST TECHNIQUE: Multiplanar, multiecho pulse sequences of the brain and surrounding structures  were obtained without intravenous contrast. Angiographic images of the head were obtained using MRA technique without contrast. COMPARISON:  Head CT 01/04/2018 FINDINGS: MRI HEAD FINDINGS Brain: The midline structures are normal. There is multifocal  abnormal diffusion restriction within the right frontal lobe, including the precentral gyrus and superior frontal gyrus/anterior paracentral gyrus. The latter focus is in the anterior cerebral artery distribution. The others are in the MCA distribution. Very mild multifocal white matter hyperintensity. Some of these foci related to the areas of acute ischemia. Others are nonspecific and commonly seen at this age. No mass lesion. No chronic microhemorrhage or cerebral amyloid angiopathy. No hydrocephalus, age advanced atrophy or lobar predominant volume loss. No dural abnormality or extra-axial collection. Skull and upper cervical spine: The visualized skull base, calvarium, upper cervical spine and extracranial soft tissues are normal. Sinuses/Orbits: Right mastoid effusion. Paranasal sinuses are clear. Normal orbits. MRA HEAD FINDINGS Intracranial internal carotid arteries: Normal. Anterior cerebral arteries: Normal. Middle cerebral arteries: Normal right MCA. There is severe proximal stenosis of the most anterior M2 branch of the left MCA (series 7 image 62), with normal distal flow-related enhancement. No clear correlate for this is seen on the maximum intensity projections. Posterior communicating arteries: Absent Posterior cerebral arteries: Normal. Basilar artery: Normal. Vertebral arteries: Left dominant. Normal. Superior cerebellar arteries: Normal. Anterior inferior cerebellar arteries: Normal. Posterior inferior cerebellar arteries: Normal. IMPRESSION: 1. Multiple punctate foci of acute ischemia within the right frontal lobe, including the precentral gyrus, which is in the right MCA territory, and the left superior frontal 4 anterior paracentral gyrus, both of  which are in the anterior cerebral artery distribution. The involvement of multiple vascular territories on the right suggest a right-sided embolic source. 2. No hemorrhage or mass effect. 3. No emergent large vessel occlusion. 4. Severe stenosis of the anterior most left M2 branch of the MCA, with normal distal flow-related enhancement. Electronically Signed   By: Ulyses Jarred M.D.   On: 01/05/2018 03:56   Ct Head Code Stroke Wo Contrast  Result Date: 01/04/2018 CLINICAL DATA:  Code stroke.  Slurred speech.  Facial numbness. EXAM: CT HEAD WITHOUT CONTRAST TECHNIQUE: Contiguous axial images were obtained from the base of the skull through the vertex without intravenous contrast. COMPARISON:  None. FINDINGS: Brain: No evidence of acute infarction, hemorrhage, hydrocephalus, extra-axial collection or mass lesion/mass effect. Normal for age cerebral volume. No significant white matter disease. Vascular: Calcification of the cavernous internal carotid arteries consistent with cerebrovascular atherosclerotic disease. No signs of intracranial large vessel occlusion. Skull: Normal. Negative for fracture or focal lesion. Sinuses/Orbits: No acute finding. Other: None. ASPECTS [facility] Stroke Program Early CT Score) - Ganglionic level infarction (caudate, lentiform nuclei, internal capsule, insula, M1-M3 cortex): 7 - Supraganglionic infarction (M4-M6 cortex): 3 Total score (0-10 with 10 being normal): 10 IMPRESSION: 1. Unremarkable CT head without contrast. 2. ASPECTS is 10. A call was placed to the emergency department by myself on 01/04/2018 at 4:58 p.m. Findings were conveyed to Dr. Cinda Quest. Electronically Signed   By: Staci Righter M.D.   On: 01/04/2018 17:01     ASSESSMENT AND PLAN:   *Acute embolic CVA.  Symptoms are improved.  Echocardiogram with no source.  Carotid Dopplers with no acute findings.  Telemetry with no arrhythmias.  Discussed with Dr. Doy Mince of neurology.  Patient will be on aspirin and  statins.  Continue telemetry monitoring.  Advised TEE. Discussed with Dr. Saunders Revel of cardiology.  TEE in the morning.  N.p.o. after midnight.  *COPD stable.  Nebulizers as needed  *DVT prophylaxis with Lovenox  All the records are reviewed and case discussed with Care Management/Social Worker Management plans discussed with the patient, family and they are in agreement.  CODE  STATUS: FULL CODE  DVT Prophylaxis: SCDs  TOTAL TIME TAKING CARE OF THIS PATIENT: 30 minutes.   POSSIBLE D/C IN 1-2 DAYS, DEPENDING ON CLINICAL CONDITION.  Leia Alf Neale Marzette M.D on 01/05/2018 at 3:22 PM  Between 7am to 6pm - Pager - [phone]  After 6pm go to [email or web address] - password [email or web address] [place] [facility]  Office  [phone]  CC: Primary care physician; Kathrine Haddock, NP  Note: This dictation was prepared with Dragon dictation along with smaller phrase technology. Any transcriptional errors that result from this process are unintentional.

## 2018-01-05 NOTE — Care Management Note (Signed)
Case Management Note  Patient Details  Name: Sharon [name] MRN: [id number] Date of Birth: Mar 01, 1946  Subjective/Objective: Admitted to [facility] with the diagnosis of TIA under observation status. Lives with husband, [name] [phone]). Last appointment with Kathrine Haddock was 12/15/16, next appointment is 01/12/18. Prescriptions are filled at [facility]. No home health. No skilled nursing. No home oxygen. Rolling walker in the home, if needed. Takes care of all basic and instrumental activities of daily living herself, drives. No falls. Good appetite. Husband will transport                   Action/Plan: Physical therapy evaluation completed. Recommending home with home health and physical therapy. Would like [facility] who is now seeing her husband in the home. Will update Kristian Covey, [facility] representative.   Expected Discharge Date:                  Expected Discharge Plan:     In-House Referral:   yes  Discharge planning Services   yes  Post Acute Care Choice:    Choice offered to:     DME Arranged:    DME Agency:     HH Arranged:   yes HH Agency:    [facility] Status of Service:     If discussed at [place] of Stay Meetings, dates discussed:    Additional Comments:  Shelbie Ammons, RN MSN CCM Care Management [phone] 01/05/2018, 11:39 AM

## 2018-01-05 NOTE — Progress Notes (Signed)
*  PRELIMINARY RESULTS* Echocardiogram 2D Echocardiogram has been performed.  Sharon [name] 01/05/2018, 11:47 AM

## 2018-01-05 NOTE — Care Management Obs Status (Signed)
[place] NOTIFICATION   Patient Details  Name: Sharon [name] MRN: [id number] Date of Birth: 1945/12/08   Medicare Observation Status Notification Given:  Yes    Shelbie Ammons, RN 01/05/2018, 11:35 AM

## 2018-01-05 NOTE — Evaluation (Signed)
Physical Therapy Evaluation Patient Details Name: Sharon [name] MRN: [id number] DOB: [birth year] Today's Date: 01/05/2018   History of Present Illness  Pt is a 72 year old female admitted s/p TIA and glossitis following c/o L side weakness.  PMH includes TMD, pneumonia, osteoporosis, COPD, asthma and anxiety.  Clinical Impression  Pt is a 72 year old female who lives in a one story home with her husband.  Pt is independent at baseline with no use of AD.  She is in bed upon PT arrival and able to perform bed mobility and STS transfer independently.  Pt presents with WNL strength of UE/LE with mildly decreased strength of R UE due to pt reported wrist pain.  Pt was able to ambulate 60 ft with RW, reporting fatigue starting at 30-40 ft.  PT advised pt to use RW during first few days of recovery process when walking greater distances.  Pt expressed understanding.  Pt demonstrated independence with balance prior to walking and becoming fatigued.  PT provided education concerning safe use of RW and pt was able to follow directions.  She presented with mildly decreased coordination of BUE's during finger to nose test and was unable to perform heel to shin due to flexibility deficits.  Pt will continue to benefit from skilled PT with focus on tolerance to activity, safe use of AD, balance and coordination.    Follow Up Recommendations Home health PT    Equipment Recommendations       Recommendations for Other Services       Precautions / Restrictions Precautions Precautions: Fall Restrictions Weight Bearing Restrictions: No      Mobility  Bed Mobility Overal bed mobility: Independent                Transfers Overall transfer level: Independent                  Ambulation/Gait Ambulation/Gait assistance: Supervision Ambulation Distance (Feet): 60 Feet Assistive device: Rolling walker (2 wheeled)     Gait velocity interpretation: at or above normal speed for  age/gender General Gait Details: Pt able to ambulate with RW and VC's for safe use.  Pt was able to follow directions on first command.  Presented with moderate foot clearance and no assymetry of gait.  Stairs            Wheelchair Mobility    Modified Rankin (Stroke Patients Only)       Balance Overall balance assessment: Modified Independent(Pt preferred RW for support due to fatigue after 20-30 ft of ambulation.  Able to stand without assistance for balance.  PT advised pt to use RW at home during recovery process and gradually wean to [facility] after endurance has returned.)                                           Pertinent Vitals/Pain Pain Assessment: No/denies pain    Home Living Family/patient expects to be discharged to:: Private residence Living Arrangements: Spouse/significant other Available Help at Discharge: Family;Available PRN/intermittently Type of Home: House Home Access: Stairs to enter Entrance Stairs-Rails: Can reach both Entrance Stairs-Number of Steps: 10 Home Layout: Two level;Laundry or work area in [employer]: Toilet riser;Tub bench;Walker - 2 wheels;Cane - single point      Prior Function Level of Independence: Independent         Comments: Able to drive,  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     Hand Dominance   Dominant Hand: Right(R wrist has arthritis and was told that she may need surgery last week.)    Extremity/Trunk Assessment   Upper Extremity Assessment Upper Extremity Assessment: Overall WFL for tasks assessed(Presents with slightly decreased strength of R UE due to pt reported joint pain in wrist.)    Lower Extremity Assessment Lower Extremity Assessment: Overall WFL for tasks assessed([place] 4/5 bilaterally.)    Cervical / Trunk Assessment Cervical / Trunk Assessment: Normal  Communication   Communication: No difficulties   Cognition Arousal/Alertness: Awake/alert Behavior During Therapy: WFL for tasks assessed/performed Overall Cognitive Status: Within Functional Limits for tasks assessed                                        General Comments General comments (skin integrity, edema, etc.): Coordination testing: finger to nose: L: 10 sec., R: 8 sec    Exercises     Assessment/Plan    PT Assessment Patient needs continued PT services  PT Problem List Decreased strength;Decreased mobility;Decreased activity tolerance;Decreased knowledge of use of DME       PT Treatment Interventions DME instruction;Functional mobility training;Balance training;Patient/family education;Gait training;Therapeutic activities;Neuromuscular re-education;Therapeutic exercise;Stair training    PT Goals (Current goals can be found in the Care Plan section)  Acute Rehab PT Goals Patient Stated Goal: To return home and to her active lifestyle. PT Goal Formulation: With patient Time For Goal Achievement: 01/19/18 Potential to Achieve Goals: Good    Frequency Min 2X/week   Barriers to discharge        Co-evaluation               AM-PAC PT "6 Clicks" Daily Activity  Outcome Measure Difficulty turning over in bed (including adjusting bedclothes, sheets and blankets)?: None Difficulty moving from lying on back to sitting on the side of the bed? : None Difficulty sitting down on and standing up from a chair with arms (e.g., wheelchair, bedside commode, etc,.)?: None Help needed moving to and from a bed to chair (including a wheelchair)?: A Little Help needed walking in hospital room?: A Little Help needed climbing 3-5 steps with a railing? : A Little 6 Click Score: 21    End of Session Equipment Utilized During Treatment: Gait belt Activity Tolerance: Patient tolerated treatment well;Patient limited by fatigue Patient left: in chair;with chair alarm set;with call bell/phone within reach;with  nursing/sitter in room Nurse Communication: Mobility status PT Visit Diagnosis: Muscle weakness (generalized) (M62.81);Unsteadiness on feet (R26.81)    Time: 7858-8502 PT Time Calculation (min) (ACUTE ONLY): 25 min   Charges:   PT Evaluation $PT Eval Low Complexity: 1 Low PT Treatments $Gait Training: 8-22 mins   PT G Codes:   PT G-Codes **NOT FOR INPATIENT CLASS** Functional Assessment Tool Used: AM-PAC 6 Clicks Basic Mobility    Roxanne Gates, PT, DPT   Roxanne Gates 01/05/2018, 9:00 AM

## 2018-01-05 NOTE — Progress Notes (Signed)
SLP Cancellation Note  Patient Details Name: Sharon [name] MRN: [id number] DOB: February 09, 1946   Cancelled treatment:       Reason Eval/Treat Not Completed: SLP screened, no needs identified, will sign off(chart reviewed; consulted NSG then met w/ pt/husband in room). Pt denied any difficulty swallowing and is currently on a regular diet; tolerates swallowing pills w/ water per NSG. Pt conversed at conversational level w/out overt deficits noted; pt and family denied any speech-language deficits - pt stated she still feels "numb" in her Left corner of mouth/face/eye area. No facial/labial droop noted. Educated pt on increasing her speech and ROM during such to increased movement and blood flow to the area as the tingling/numbness continues to wear off. As speech is 100% intelligible and no dysarthria was noted, no further skilled ST services indicated as pt appears to be returning to her baseline. Pt and husband agreed. NSG to reconsult if any change in status.    Orinda Kenner, MS, CCC-SLP Watson,Katherine 01/05/2018, 12:14 PM

## 2018-01-05 NOTE — Progress Notes (Signed)
Pt received ativan for claustrophobia during MRI.  Pt taken by wheelchair to MRI. Dorna Bloom RN

## 2018-01-05 NOTE — Evaluation (Signed)
Occupational Therapy Evaluation Patient Details Name: Sharon [name] MRN: [id number] DOB: 1945-11-07 Today's Date: 01/05/2018    History of Present Illness Pt is a 72 year old female admitted s/p TIA and glossitis following c/o L side weakness.  PMH includes TMD, pneumonia, osteoporosis, COPD, asthma and anxiety.   Clinical Impression   Pt seen for OT evaluation this date. Prior to hospital admission, pt was independent and eager to return to PLOF.  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  Currently pt is near baseline independence with mild activity tolerance, L grip/pinch deficits and L FMC deficits noted with testing. Pt/spouse instructed in compensatory strategies and fine motor coordination and strengthening exercises for L hand to maximize return to PLOF. Pt able to return demo proper technique. No additional skilled OT needs at this time. Will sign off. Please re-consult if additional needs arise.     Follow Up Recommendations  No OT follow up    Equipment Recommendations  None recommended by OT    Recommendations for Other Services       Precautions / Restrictions Precautions Precautions: Fall Precaution Comments: pt noted with non-yellow grippy socks, bed alarm off, no formal falls precautions Restrictions Weight Bearing Restrictions: No      Mobility Bed Mobility Overal bed mobility: Independent                Transfers Overall transfer level: Independent Equipment used: None                  Balance Overall balance assessment: No apparent balance deficits (not formally assessed)                                         ADL either performed or assessed with clinical judgement   ADL Overall ADL's : Modified independent                                       General ADL Comments: pt in bathroom upon OT's arrival, performed independently and ambulated with no AD in room back to EOB. Pt  notes mild difficulty with fine motor tasks such as managing toothpaste, jar lids due to decreased grip strength bilaterally but otherwise near baseline     Vision Baseline Vision/History: Wears glasses Wears Glasses: At all times Patient Visual Report: No change from baseline       Perception     Praxis      Pertinent Vitals/Pain Pain Assessment: No/denies pain     Hand Dominance Right   Extremity/Trunk Assessment Upper Extremity Assessment Upper Extremity Assessment: Overall WFL for tasks assessed;LUE deficits/detail(R grip impaired by wrist arthritis (being followed by MD), otherwise [facility]) LUE Deficits / Details: grossly WFL, L thumb and index finger impairing grip strength and coordination with finger to nose and thumb opposition testing, sensation intact   Lower Extremity Assessment Lower Extremity Assessment: Overall WFL for tasks assessed   Cervical / Trunk Assessment Cervical / Trunk Assessment: Normal   Communication Communication Communication: No difficulties   Cognition Arousal/Alertness: Awake/alert Behavior During Therapy: WFL for tasks assessed/performed Overall Cognitive Status: Within Functional Limits for tasks assessed  General Comments       Exercises Other Exercises Other Exercises: Pt instructed in fine motor coordination and grip/pinch strengthening exercises for L hand to maximize safety and functional use. Pt able to return demo.   Shoulder Instructions      Home Living Family/patient expects to be discharged to:: Private residence Living Arrangements: Spouse/significant other Available Help at Discharge: Family;Available PRN/intermittently Type of Home: House Home Access: Stairs to enter [employer] of Steps: 10 Entrance Stairs-Rails: Can reach both Home Layout: Two level;Laundry or work area in Building surveyor of Steps: 12 Alternate Level Stairs-Rails:  Can reach both Bathroom Shower/Tub: Occupational psychologist: Standard(w/ riser)     Home Equipment: Toilet riser;Tub bench;Walker - 2 wheels;Cane - single point          Prior Functioning/Environment Level of Independence: Independent        Comments: Able to drive, 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 No falls.        OT Problem List:        OT Treatment/Interventions:      OT Goals(Current goals can be found in the care plan section) Acute Rehab OT Goals Patient Stated Goal: To return home and to her active lifestyle. OT Goal Formulation: All assessment and education complete, DC therapy  OT Frequency:     Barriers to D/C:            Co-evaluation              AM-PAC PT "6 Clicks" Daily Activity     Outcome Measure Help from another person eating meals?: None Help from another person taking care of personal grooming?: None Help from another person toileting, which includes using toliet, bedpan, or urinal?: None Help from another person bathing (including washing, rinsing, drying)?: None Help from another person to put on and taking off regular upper body clothing?: None Help from another person to put on and taking off regular lower body clothing?: None 6 Click Score: 24   End of Session    Activity Tolerance: Patient tolerated treatment well Patient left: in bed;with call bell/phone within reach;with family/visitor present  OT Visit Diagnosis: Muscle weakness (generalized) (M62.81);Other abnormalities of gait and mobility (R26.89)                Time: 1610-9604 OT Time Calculation (min): 18 min Charges:  OT General Charges $OT Visit: 1 Visit OT Evaluation $OT Eval Low Complexity: 1 Low OT Treatments $Self Care/Home Management : 8-22 mins  Jeni Salles, MPH, MS, OTR/L ascom [phone] 01/05/18, 3:10 PM

## 2018-01-05 NOTE — Consult Note (Addendum)
Referring Physician: Sudini    Chief Complaint: Facial droop and difficulty with speech  HPI: Sharon [name] is an 72 y.o. female who presented to the ED on yesterday for slurred speech and dizziness.  Patient noticed dizziness initially and slurring of her words. She said her left hand came up and slapped her right hand uncontrollably. She felt numbness and her face worse on the left and her left arm was weaker.  Patient presented for evaluation.  Initial NIHSS of 3.   Date last known well: Date: 01/04/2018 Time last known well: Time: 15:30 tPA Given: No: Improvement in symptoms  Past Medical History:  Diagnosis Date  . Anxiety   . Arthritis    hands, upper back  . Asthma   . COPD (chronic obstructive pulmonary disease) ([place])   . History of cervical cancer   . Menopausal disorder   . Osteoporosis   . Pneumonia 1960  . Spasm of abdominal muscles of right side    intermittent  . TMJ (dislocation of temporomandibular joint)   . Wears dentures    partial lower    Past Surgical History:  Procedure Laterality Date  . ABDOMINAL HYSTERECTOMY  1970's  . bladder botox  2005  . BLADDER SUSPENSION  2004  . COLONOSCOPY WITH PROPOFOL N/A 12/13/2015   Procedure: COLONOSCOPY WITH PROPOFOL;  Surgeon: Lucilla Lame, MD;  Location: [facility];  Service: Endoscopy;  Laterality: N/A;  . POLYPECTOMY N/A 12/13/2015   Procedure: POLYPECTOMY;  Surgeon: Lucilla Lame, MD;  Location: [facility];  Service: Endoscopy;  Laterality: N/A;  SIGMOID COLON POLYPS X  5  . SHOULDER ARTHROSCOPY W/ ROTATOR CUFF REPAIR Right 1998  . TONSILLECTOMY AND ADENOIDECTOMY      Family History  Problem Relation Age of Onset  . Diabetes Mother   . Heart disease Mother   . Stroke Mother   . Stroke Maternal Grandmother    Social History:  reports that she quit smoking about 17 months ago. Her smoking use included cigarettes. She has a 33.60 pack-year smoking history. She has never used smokeless tobacco. She  reports that she drinks alcohol. She reports that she does not use drugs.  Allergies:  Allergies  Allergen Reactions  . Librium [Chlordiazepoxide] Itching    Dizziness   . Bactrim [Sulfamethoxazole-Trimethoprim] Nausea And Vomiting  . Ibuprofen Rash    Mouth swelling  . Latex Rash    Some bandaids, some gloves  . Naprosyn [Naproxen] Rash    Mouth swelling  . Other Rash    [place] nuts - mouth swelling    Medications:  I have reviewed the patient's current medications. Prior to Admission:  Facility-Administered Medications Prior to Admission  Medication Dose Route Frequency Provider Last Rate Last Dose  . cyanocobalamin ((VITAMIN B-12)) injection 1,000 mcg  1,000 mcg Intramuscular Q30 days Kathrine Haddock, NP   1,000 mcg at 12/15/17 1410   Medications Prior to Admission  Medication Sig Dispense Refill Last Dose  . albuterol (PROAIR HFA) 108 (90 Base) MCG/ACT inhaler Inhale 2 puffs into the lungs every 6 (six) hours as needed for wheezing or shortness of breath. 8.5 Inhaler 12 PRN at PRN  . aspirin EC 81 MG tablet Take 81 mg by mouth every other day.    01/04/2018 at 1530  . b complex vitamins capsule Take 1 capsule by mouth daily.   01/04/2018 at 0800  . budesonide-formoterol (SYMBICORT) 160-4.5 MCG/ACT inhaler Inhale 2 puffs into the lungs 2 (two) times daily.   01/04/2018  at 0800  . Cholecalciferol (VITAMIN D) 2000 units tablet Take 2,000 Units by mouth daily.   01/04/2018 at 0800  . Cyanocobalamin (B-12 PO) Take 1 tablet by mouth daily.   01/04/2018 at 0730  . Cyanocobalamin 1000 MCG/ML KIT Inject 1,000 mcg as directed every 30 (thirty) days.   Past Month at Unknown time  . estradiol (CLIMARA - DOSED IN MG/24 HR) 0.1 mg/24hr patch Place 0.1 mg onto the skin 2 (two) times a week.   PRN at PRN  . guaiFENesin (MUCINEX) 600 MG 12 hr tablet Take 1 tablet (600 mg total) by mouth 2 (two) times daily. 30 tablet 0 01/04/2018 at 0800  . MELOXICAM PO Take 1 tablet by mouth daily.   Past Week at  Unknown time  . Multiple Vitamin (MULTIVITAMIN) tablet Take 2 tablets by mouth daily.    01/04/2018 at 0800  . omeprazole (PRILOSEC) 40 MG capsule Take 40 mg by mouth daily.   01/03/2018 at 1800  . SUPER GARLIC PO Take 2 tablets by mouth daily.   01/04/2018 at 0800  . clonazePAM (KLONOPIN) 0.5 MG tablet Take 1 tablet (0.5 mg total) by mouth daily as needed for anxiety. (Patient not taking: Reported on 11/02/2017) 20 tablet 1 Not Taking at Unknown time  . estradiol (ESTRACE) 1 MG tablet Take 1 tablet (1 mg total) by mouth daily. (Patient not taking: Reported on 01/04/2018) 90 tablet 3 Not Taking at Unknown time  . Tiotropium Bromide Monohydrate (SPIRIVA RESPIMAT) 2.5 MCG/ACT AERS Inhale 2 puffs into the lungs daily. (Patient not taking: Reported on 01/04/2018) 1 Inhaler 12 Not Taking at Unknown time   Scheduled: . aspirin  300 mg Rectal Daily   Or  . aspirin EC  325 mg Oral Daily  . atorvastatin  40 mg Oral q1800  . cholecalciferol  2,000 Units Oral Daily  . cyanocobalamin  1,000 mcg Intramuscular Q30 days  . enoxaparin (LOVENOX) injection  40 mg Subcutaneous Q24H  . guaiFENesin  600 mg Oral BID  . mometasone-formoterol  2 puff Inhalation BID  . multivitamin  1 tablet Oral Daily  . pantoprazole  40 mg Oral Daily    ROS: History obtained from the patient  General ROS: negative for - chills, fatigue, fever, night sweats, weight gain or weight loss Psychological ROS: negative for - behavioral disorder, hallucinations, memory difficulties, mood swings or suicidal ideation Ophthalmic ROS: poor peripheral vision ENT ROS: negative for - epistaxis, nasal discharge, oral lesions, sore throat, tinnitus  Allergy and Immunology ROS: negative for - hives or itchy/watery eyes Hematological and Lymphatic ROS: negative for - bleeding problems, bruising or swollen lymph nodes Endocrine ROS: negative for - galactorrhea, hair pattern changes, polydipsia/polyuria or temperature intolerance Respiratory ROS:  negative for - cough, hemoptysis, shortness of breath or wheezing Cardiovascular ROS: negative for - chest pain, dyspnea on exertion, edema or irregular heartbeat Gastrointestinal ROS: negative for - abdominal pain, diarrhea, hematemesis, nausea/vomiting or stool incontinence Genito-Urinary ROS: negative for - dysuria, hematuria, incontinence or urinary frequency/urgency Musculoskeletal ROS: negative for - joint swelling or muscular weakness Neurological ROS: as noted in HPI Dermatological ROS: negative for rash and skin lesion changes  Physical Examination: Blood pressure (!) 113/45, pulse 71, temperature 98.2 F (36.8 C), temperature source Oral, resp. rate 14, height 5' 9"  (1.753 m), weight 86.2 kg (190 lb), SpO2 97 %.  HEENT-  Normocephalic, no lesions, without obvious abnormality.  Normal external eye and conjunctiva.  Normal TM's bilaterally.  Normal auditory canals and external ears.  Normal external nose, mucus membranes and septum.  Normal pharynx. Cardiovascular- S1, S2 normal, pulses palpable throughout   Lungs- chest clear, no wheezing, rales, normal symmetric air entry Abdomen- soft, non-tender; bowel sounds normal; no masses,  no organomegaly Extremities- no edema Lymph-no adenopathy palpable Musculoskeletal-no joint tenderness, deformity or swelling Skin-warm and dry, no hyperpigmentation, vitiligo, or suspicious lesions  Neurological Examination   Mental Status: Alert, oriented, thought content appropriate.  Speech fluent without evidence of aphasia.  Able to follow 3 step commands without difficulty. Cranial Nerves: II: Discs flat bilaterally; Visual fields grossly normal, pupils equal, round, reactive to light and accommodation III,IV, VI: ptosis not present, extra-ocular motions intact bilaterally V,VII: smile symmetric, facial light touch sensation decreased on the left VIII: hearing normal bilaterally IX,X: gag reflex present XI: bilateral shoulder shrug XII:  midline tongue extension Motor: Right : Upper extremity   5/5    Left:     Upper extremity   5/5  Lower extremity   5/5     Lower extremity   5-/5 Tone and bulk:normal tone throughout; no atrophy noted Sensory: Pinprick and light touch intact throughout, bilaterally Deep Tendon Reflexes: 2+ and symmetric with absent AJ's bilaterally Plantars: Right: downgoing   Left: upgoing Cerebellar: Dysmetria with finger-to-nose on the left, normal heel-to-shin testing Gait: not tested due to safety concerns   Laboratory Studies:  Basic Metabolic Panel: Recent Labs  Lab 01/04/18 1655  NA 136  K 4.2  CL 103  CO2 23  GLUCOSE 128*  BUN 21*  CREATININE 0.94  CALCIUM 9.1    Liver Function Tests: Recent Labs  Lab 01/04/18 1655  AST 27  ALT 30  ALKPHOS 67  BILITOT 0.6  PROT 7.4  ALBUMIN 4.2   No results for input(s): LIPASE, AMYLASE in the last 168 hours. No results for input(s): AMMONIA in the last 168 hours.  CBC: Recent Labs  Lab 01/04/18 1655  WBC 10.0  NEUTROABS 7.2*  HGB 14.4  HCT 42.4  MCV 91.7  PLT 237    Cardiac Enzymes: Recent Labs  Lab 01/04/18 1655  TROPONINI <0.03    BNP: Invalid input(s): POCBNP  CBG: Recent Labs  Lab 01/04/18 1702  GLUCAP 114*    Microbiology: Results for orders placed or performed in visit on 10/17/16  Rapid strep screen (not at [facility])     Status: None   Collection Time: 10/17/16  2:21 PM  Result Value Ref Range Status   Strep Gp A Ag, IA W/Reflex Negative Negative Final  Culture, Group A Strep     Status: Abnormal   Collection Time: 10/17/16  2:21 PM  Result Value Ref Range Status   Strep A Culture CANCELED (A)      Comment: Test not performed. Specimen could not be located. notified Loma Sender  Result canceled by the ancillary     Coagulation Studies: Recent Labs    01/04/18 1655  LABPROT 12.0  INR 0.89    Urinalysis: No results for input(s): COLORURINE, LABSPEC, PHURINE, GLUCOSEU, HGBUR, BILIRUBINUR,  KETONESUR, PROTEINUR, UROBILINOGEN, NITRITE, LEUKOCYTESUR in the last 168 hours.  Invalid input(s): APPERANCEUR  Lipid Panel:    Component Value Date/Time   CHOL 191 01/05/2018 0629   CHOL 191 11/25/2017 1038   TRIG 54 01/05/2018 0629   HDL 55 01/05/2018 0629   HDL 63 11/25/2017 1038   CHOLHDL 3.5 01/05/2018 0629   VLDL 11 01/05/2018 0629   LDLCALC 125 (H) 01/05/2018 0629   LDLCALC 109 (H) 11/25/2017 1038  HgbA1C: No results found for: HGBA1C  Urine Drug Screen:  No results found for: LABOPIA, COCAINSCRNUR, LABBENZ, AMPHETMU, THCU, LABBARB  Alcohol Level: No results for input(s): ETH in the last 168 hours.  Other results: EKG: sinus rhythm at 81 bpm.  Imaging: Mr Brain Wo Contrast  Result Date: 01/05/2018 CLINICAL DATA:  Slurred speech and left hand weakness EXAM: MRI HEAD WITHOUT CONTRAST MRA HEAD WITHOUT CONTRAST TECHNIQUE: Multiplanar, multiecho pulse sequences of the brain and surrounding structures were obtained without intravenous contrast. Angiographic images of the head were obtained using MRA technique without contrast. COMPARISON:  Head CT 01/04/2018 FINDINGS: MRI HEAD FINDINGS Brain: The midline structures are normal. There is multifocal abnormal diffusion restriction within the right frontal lobe, including the precentral gyrus and superior frontal gyrus/anterior paracentral gyrus. The latter focus is in the anterior cerebral artery distribution. The others are in the MCA distribution. Very mild multifocal white matter hyperintensity. Some of these foci related to the areas of acute ischemia. Others are nonspecific and commonly seen at this age. No mass lesion. No chronic microhemorrhage or cerebral amyloid angiopathy. No hydrocephalus, age advanced atrophy or lobar predominant volume loss. No dural abnormality or extra-axial collection. Skull and upper cervical spine: The visualized skull base, calvarium, upper cervical spine and extracranial soft tissues are normal.  Sinuses/Orbits: Right mastoid effusion. Paranasal sinuses are clear. Normal orbits. MRA HEAD FINDINGS Intracranial internal carotid arteries: Normal. Anterior cerebral arteries: Normal. Middle cerebral arteries: Normal right MCA. There is severe proximal stenosis of the most anterior M2 branch of the left MCA (series 7 image 62), with normal distal flow-related enhancement. No clear correlate for this is seen on the maximum intensity projections. Posterior communicating arteries: Absent Posterior cerebral arteries: Normal. Basilar artery: Normal. Vertebral arteries: Left dominant. Normal. Superior cerebellar arteries: Normal. Anterior inferior cerebellar arteries: Normal. Posterior inferior cerebellar arteries: Normal. IMPRESSION: 1. Multiple punctate foci of acute ischemia within the right frontal lobe, including the precentral gyrus, which is in the right MCA territory, and the left superior frontal 4 anterior paracentral gyrus, both of which are in the anterior cerebral artery distribution. The involvement of multiple vascular territories on the right suggest a right-sided embolic source. 2. No hemorrhage or mass effect. 3. No emergent large vessel occlusion. 4. Severe stenosis of the anterior most left M2 branch of the MCA, with normal distal flow-related enhancement. Electronically Signed   By: Ulyses Jarred M.D.   On: 01/05/2018 03:56   US Carotid Bilateral (at [facility] And [facility] Only)  Result Date: 01/05/2018 CLINICAL DATA:  TIA symptoms EXAM: BILATERAL CAROTID DUPLEX ULTRASOUND TECHNIQUE: Pearline Cables scale imaging, color Doppler and duplex ultrasound were performed of bilateral carotid and vertebral arteries in the neck. COMPARISON:  01/05/2018 MRI FINDINGS: Criteria: Quantification of carotid stenosis is based on velocity parameters that correlate the residual internal carotid diameter with NASCET-based stenosis levels, using the diameter of the distal internal carotid lumen as the denominator for stenosis  measurement. The following velocity measurements were obtained: RIGHT ICA:  63/11 cm/sec CCA:  23/3 cm/sec SYSTOLIC ICA/CCA RATIO:  0.8 DIASTOLIC ICA/CCA RATIO:  1.2 ECA:  71 cm/sec LEFT ICA:  75/20 cm/sec CCA:  43/56 cm/sec SYSTOLIC ICA/CCA RATIO:  0.9 DIASTOLIC ICA/CCA RATIO:  1.7 ECA:  102 cm/sec RIGHT CAROTID ARTERY: Minor echogenic shadowing plaque formation. No hemodynamically significant right ICA stenosis, velocity elevation, or turbulent flow. Degree of narrowing less than 50%. RIGHT VERTEBRAL ARTERY:  Antegrade LEFT CAROTID ARTERY: Similar scattered minor echogenic plaque formation. No hemodynamically significant left  ICA stenosis, velocity elevation, or turbulent flow. LEFT VERTEBRAL ARTERY:  Antegrade IMPRESSION: Minor carotid atherosclerosis. No hemodynamically significant ICA stenosis. Degree of narrowing less than 50% bilaterally by ultrasound criteria. Patent antegrade vertebral flow bilaterally Electronically Signed   By: Jerilynn Mages.  Shick M.D.   On: 01/05/2018 10:19   Mr Jodene Nam Head/brain KZ Cm  Result Date: 01/05/2018 CLINICAL DATA:  Slurred speech and left hand weakness EXAM: MRI HEAD WITHOUT CONTRAST MRA HEAD WITHOUT CONTRAST TECHNIQUE: Multiplanar, multiecho pulse sequences of the brain and surrounding structures were obtained without intravenous contrast. Angiographic images of the head were obtained using MRA technique without contrast. COMPARISON:  Head CT 01/04/2018 FINDINGS: MRI HEAD FINDINGS Brain: The midline structures are normal. There is multifocal abnormal diffusion restriction within the right frontal lobe, including the precentral gyrus and superior frontal gyrus/anterior paracentral gyrus. The latter focus is in the anterior cerebral artery distribution. The others are in the MCA distribution. Very mild multifocal white matter hyperintensity. Some of these foci related to the areas of acute ischemia. Others are nonspecific and commonly seen at this age. No mass lesion. No chronic  microhemorrhage or cerebral amyloid angiopathy. No hydrocephalus, age advanced atrophy or lobar predominant volume loss. No dural abnormality or extra-axial collection. Skull and upper cervical spine: The visualized skull base, calvarium, upper cervical spine and extracranial soft tissues are normal. Sinuses/Orbits: Right mastoid effusion. Paranasal sinuses are clear. Normal orbits. MRA HEAD FINDINGS Intracranial internal carotid arteries: Normal. Anterior cerebral arteries: Normal. Middle cerebral arteries: Normal right MCA. There is severe proximal stenosis of the most anterior M2 branch of the left MCA (series 7 image 62), with normal distal flow-related enhancement. No clear correlate for this is seen on the maximum intensity projections. Posterior communicating arteries: Absent Posterior cerebral arteries: Normal. Basilar artery: Normal. Vertebral arteries: Left dominant. Normal. Superior cerebellar arteries: Normal. Anterior inferior cerebellar arteries: Normal. Posterior inferior cerebellar arteries: Normal. IMPRESSION: 1. Multiple punctate foci of acute ischemia within the right frontal lobe, including the precentral gyrus, which is in the right MCA territory, and the left superior frontal 4 anterior paracentral gyrus, both of which are in the anterior cerebral artery distribution. The involvement of multiple vascular territories on the right suggest a right-sided embolic source. 2. No hemorrhage or mass effect. 3. No emergent large vessel occlusion. 4. Severe stenosis of the anterior most left M2 branch of the MCA, with normal distal flow-related enhancement. Electronically Signed   By: Ulyses Jarred M.D.   On: 01/05/2018 03:56   Ct Head Code Stroke Wo Contrast  Result Date: 01/04/2018 CLINICAL DATA:  Code stroke.  Slurred speech.  Facial numbness. EXAM: CT HEAD WITHOUT CONTRAST TECHNIQUE: Contiguous axial images were obtained from the base of the skull through the vertex without intravenous contrast.  COMPARISON:  None. FINDINGS: Brain: No evidence of acute infarction, hemorrhage, hydrocephalus, extra-axial collection or mass lesion/mass effect. Normal for age cerebral volume. No significant white matter disease. Vascular: Calcification of the cavernous internal carotid arteries consistent with cerebrovascular atherosclerotic disease. No signs of intracranial large vessel occlusion. Skull: Normal. Negative for fracture or focal lesion. Sinuses/Orbits: No acute finding. Other: None. ASPECTS [facility] Stroke Program Early CT Score) - Ganglionic level infarction (caudate, lentiform nuclei, internal capsule, insula, M1-M3 cortex): 7 - Supraganglionic infarction (M4-M6 cortex): 3 Total score (0-10 with 10 being normal): 10 IMPRESSION: 1. Unremarkable CT head without contrast. 2. ASPECTS is 10. A call was placed to the emergency department by myself on 01/04/2018 at 4:58 p.m. Findings were conveyed to  Dr. Cinda Quest. Electronically Signed   By: Staci Righter M.D.   On: 01/04/2018 17:01    Assessment: 72 y.o. female presenting with slurred speech and left sided complaints.  MRI of the brain reviewed and shows multiple small right ACA and MCA territory infarcts.  Etiology likely embolic.  MRA notes severe left M2 stenosis.  Patient on ASA at home.  Carotid dopplers show no evidence of hemodynamically significant stenosis.  Echocardiogram shows no cardiac source of emboli with an EF of 70%.  LDL 125.  Stroke Risk Factors - hyperlipidemia  Plan: 1. HgbA1c 2. PT consult, OT consult, Speech consult 3. TEE 4. Prophylactic therapy-ASA 51m and Plavix 741mdaily 5. NPO until RN stroke swallow screen 6. Telemetry monitoring 7. Frequent neuro checks 8. Statin for lipid management with target LDL<70.   LeAlexis GoodellMD Neurology [phone]/26/2019, 1:28 PM

## 2018-01-05 NOTE — Consult Note (Signed)
Cardiology Consultation:   Patient ID: Sharon [name]; [id number]; 05-15-1946   Admit date: 01/04/2018 Date of Consult: 01/05/2018  Primary Care Provider: Kathrine Haddock, NP Primary Cardiologist: Kathlyn Sacramento, MD Primary Electrophysiologist:  None   Patient Profile:   Sharon [name] is a 72 y.o. female with a hx of asthma/COPD, cervical cancer, and anxiety who is being seen today for the evaluation of embolic stroke at the request of Dr. Darvin Neighbours.  History of Present Illness:   Sharon [name] presented to the emergency department yesterday afternoon with slurred speech and weakness involving the left hand.  She also noted a facial droop in the morning, though this had resolved by the time that she presented to the emergency department.  She also noticed some difficulty with balance.  Workup during this admission was notable for multiple right cerebral infarcts concerning for embolic phenomenon.  Currently, [name] reports that she still has a little bit of paresthesias along the left side of her mouth.  She also feels as though her balance is still off a bit.  [name] denies any palpitations in the past.  She also has not experienced chest pain.  She has chronic exertional dyspnea as well as 2 pillow orthopnea that she attributes to COPD.  She stopped smoking about 3 years ago.  She denies history of blood clots in the past.  This is her first stroke.  Past Medical History:  Diagnosis Date  . Anxiety   . Arthritis    hands, upper back  . Asthma   . COPD (chronic obstructive pulmonary disease) ([place])   . History of cervical cancer   . Menopausal disorder   . Osteoporosis   . Pneumonia 1960  . Spasm of abdominal muscles of right side    intermittent  . TMJ (dislocation of temporomandibular joint)   . Wears dentures    partial lower    Past Surgical History:  Procedure Laterality Date  . ABDOMINAL HYSTERECTOMY  1970's  . bladder botox  2005  . BLADDER SUSPENSION  2004  .  COLONOSCOPY WITH PROPOFOL N/A 12/13/2015   Procedure: COLONOSCOPY WITH PROPOFOL;  Surgeon: Lucilla Lame, MD;  Location: [facility];  Service: Endoscopy;  Laterality: N/A;  . POLYPECTOMY N/A 12/13/2015   Procedure: POLYPECTOMY;  Surgeon: Lucilla Lame, MD;  Location: [facility];  Service: Endoscopy;  Laterality: N/A;  SIGMOID COLON POLYPS X  5  . SHOULDER ARTHROSCOPY W/ ROTATOR CUFF REPAIR Right 1998  . TONSILLECTOMY AND ADENOIDECTOMY       Home Medications:  Prior to Admission medications   Medication Sig Start Date Judah Chevere Date Taking? Authorizing Provider  albuterol (PROAIR HFA) 108 (90 Base) MCG/ACT inhaler Inhale 2 puffs into the lungs every 6 (six) hours as needed for wheezing or shortness of breath. 07/10/17  Yes Kathrine Haddock, NP  aspirin EC 81 MG tablet Take 81 mg by mouth every other day.    Yes [provider]  b complex vitamins capsule Take 1 capsule by mouth daily.   Yes [provider]  budesonide-formoterol (SYMBICORT) 160-4.5 MCG/ACT inhaler Inhale 2 puffs into the lungs 2 (two) times daily.   Yes [provider]  Cholecalciferol (VITAMIN D) 2000 units tablet Take 2,000 Units by mouth daily.   Yes [provider]  Cyanocobalamin (B-12 PO) Take 1 tablet by mouth daily.   Yes [provider]  Cyanocobalamin 1000 MCG/ML KIT Inject 1,000 mcg as directed every 30 (thirty) days.   Yes [provider]  estradiol (CLIMARA - DOSED IN MG/24 HR) 0.1 mg/24hr patch Place 0.1 mg onto the skin 2 (two) times a week.   Yes [provider]  guaiFENesin (MUCINEX) 600 MG 12 hr tablet Take 1 tablet (600 mg total) by mouth 2 (two) times daily. 11/02/17  Yes Volney American, PA-C  MELOXICAM PO Take 1 tablet by mouth daily.   Yes [provider]  Multiple Vitamin (MULTIVITAMIN) tablet Take 2 tablets by mouth daily.    Yes [provider]  omeprazole (PRILOSEC) 40 MG capsule Take 40 mg by mouth daily. 03/02/17   Yes [provider]  SUPER GARLIC PO Take 2 tablets by mouth daily.   Yes [provider]  clonazePAM (KLONOPIN) 0.5 MG tablet Take 1 tablet (0.5 mg total) by mouth daily as needed for anxiety. Patient not taking: Reported on 11/02/2017 03/13/17   Kathrine Haddock, NP  estradiol (ESTRACE) 1 MG tablet Take 1 tablet (1 mg total) by mouth daily. Patient not taking: Reported on 01/04/2018 08/14/17   Kathrine Haddock, NP  Tiotropium Bromide Monohydrate (SPIRIVA RESPIMAT) 2.5 MCG/ACT AERS Inhale 2 puffs into the lungs daily. Patient not taking: Reported on 01/04/2018 11/25/17   Kathrine Haddock, NP    Inpatient Medications: Scheduled Meds: . [START ON 01/06/2018] aspirin EC  81 mg Oral Daily  . atorvastatin  40 mg Oral q1800  . cholecalciferol  2,000 Units Oral Daily  . [START ON 01/06/2018] clopidogrel  75 mg Oral Daily  . cyanocobalamin  1,000 mcg Intramuscular Q30 days  . enoxaparin (LOVENOX) injection  40 mg Subcutaneous Q24H  . guaiFENesin  600 mg Oral BID  . mometasone-formoterol  2 puff Inhalation BID  . multivitamin  1 tablet Oral Daily  . pantoprazole  40 mg Oral Daily   Continuous Infusions:  PRN Meds: acetaminophen **OR** acetaminophen (TYLENOL) oral liquid 160 mg/5 mL **OR** acetaminophen, albuterol  Allergies:    Allergies  Allergen Reactions  . Librium [Chlordiazepoxide] Itching    Dizziness   . Bactrim [Sulfamethoxazole-Trimethoprim] Nausea And Vomiting  . Ibuprofen Rash    Mouth swelling  . Latex Rash    Some bandaids, some gloves  . Naprosyn [Naproxen] Rash    Mouth swelling  . Other Rash    [place] nuts - mouth swelling    Social History:   Social History   Socioeconomic History  . Marital status: Married    Spouse name: Not on file  . Number of children: Not on file  . Years of education: Not on file  . Highest education level: Not on file  Occupational History  . Occupation: retired  Scientific laboratory technician  . Financial resource strain: Not hard at all    . Food insecurity:    Worry: Never true    Inability: Never true  . Transportation needs:    Medical: No    Non-medical: No  Tobacco Use  . Smoking status: Former Smoker    Packs/day: 0.60    Years: 56.00    Pack years: 33.60    Types: Cigarettes    Last attempt to quit: 07/14/2016    Years since quitting: 1.4  . Smokeless tobacco: Never Used  . Tobacco comment: has smoked off and on  Substance and Sexual Activity  . Alcohol use: Yes    Alcohol/week: 0.0 oz    Comment: occasionally - special occasions  . Drug use: No  . Sexual activity: Not Currently    Birth control/protection: Post-menopausal  Lifestyle  . Physical  activity:    Days per week: 7 days    Minutes per session: 10 min  . Stress: Very much  Relationships  . Social connections:    Talks on phone: More than three times a week    Gets together: Once a week    Attends religious service: 1 to 4 times per year    Active member of club or organization: No    Attends meetings of clubs or organizations: Not on file    Relationship status: Married  . Intimate partner violence:    Fear of current or ex partner: No    Emotionally abused: No    Physically abused: No    Forced sexual activity: No  Other Topics Concern  . 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 Diabetes Mother   . Heart disease Mother   . Stroke Mother   . Stroke Maternal Grandmother      ROS:  A 12-system review of systems was performed and was negative, as except as noted in the HPI.  Physical Exam/Data:   Vitals:   01/05/18 0437 01/05/18 0546 01/05/18 1322 01/05/18 1325  BP: (!) 108/43 (!) 113/45 (!) 77/38 (!) 108/52  Pulse: 73 71 77 70  Resp: 14  20   Temp: 98.2 F (36.8 C)  98.1 F (36.7 C)   TempSrc: Oral  Oral   SpO2: 97%  96%   Weight:      Height:        Intake/Output Summary (Last 24 hours) at 01/05/2018 1716 Last  data filed at 01/05/2018 1500 Gross per 24 hour  Intake 240 ml  Output -  Net 240 ml   Filed Weights   01/04/18 1639  Weight: 190 lb (86.2 kg)   Body mass index is 28.06 kg/m.  General:  Well nourished, well developed, in no acute distress.  Her son and husband are at the bedside. HEENT: normal Lymph: no adenopathy Neck: no JVD Endocrine:  No thryomegaly Vascular: No carotid bruits; 2+ radial pulses.  1+ pedal pulses bilaterally. Cardiac:  normal S1, S2; regular rate and rhythm without murmurs or rubs. Lungs:  clear to auscultation bilaterally, no wheezing, rhonchi or rales  Abd: soft, nontender, no hepatomegaly.  Bowel sounds present. Ext: no edema Musculoskeletal:  No deformities, BUE and BLE strength normal and equal Skin: warm and dry  Neuro: 4/out of 5 left hand grip strength.  Otherwise, 5/5 strength in upper and lower extremities.  Symmetric smile. Psych:  Normal affect   EKG:  The EKG was personally reviewed and demonstrates: Sinus rhythm with isolated PAC and borderline left atrial enlargement. Telemetry:  Telemetry was personally reviewed and demonstrates: Normal sinus rhythm and sinus tachycardia with isolated PACs.  Relevant CV Studies: Transthoracic echocardiogram (01/05/18)-personally reviewed [place] difficult study.  Normal LV size.  LVEF 65-70% with normal wall motion.  Normal RV size and function.  Mild TR.  Laboratory Data:  Chemistry Recent Labs  Lab 01/04/18 1655  NA 136  K 4.2  CL 103  CO2 23  GLUCOSE 128*  BUN 21*  CREATININE 0.94  CALCIUM 9.1  GFRNONAA 59*  GFRAA >60  ANIONGAP 10    Recent Labs  Lab 01/04/18 1655  PROT 7.4  ALBUMIN 4.2  AST 27  ALT 30  ALKPHOS 67  BILITOT 0.6   Hematology Recent Labs  Lab  01/04/18 1655  WBC 10.0  RBC 4.63  HGB 14.4  HCT 42.4  MCV 91.7  MCH 31.1  MCHC 33.9  RDW 13.6  PLT 237   Cardiac Enzymes Recent Labs  Lab 01/04/18 1655  TROPONINI <0.03   No results for input(s): TROPIPOC in the  last 168 hours.  BNPNo results for input(s): BNP, PROBNP in the last 168 hours.  DDimer No results for input(s): DDIMER in the last 168 hours.  Radiology/Studies:  Mr Brain Wo Contrast  Result Date: 01/05/2018 CLINICAL DATA:  Slurred speech and left hand weakness EXAM: MRI HEAD WITHOUT CONTRAST MRA HEAD WITHOUT CONTRAST TECHNIQUE: Multiplanar, multiecho pulse sequences of the brain and surrounding structures were obtained without intravenous contrast. Angiographic images of the head were obtained using MRA technique without contrast. COMPARISON:  Head CT 01/04/2018 FINDINGS: MRI HEAD FINDINGS Brain: The midline structures are normal. There is multifocal abnormal diffusion restriction within the right frontal lobe, including the precentral gyrus and superior frontal gyrus/anterior paracentral gyrus. The latter focus is in the anterior cerebral artery distribution. The others are in the MCA distribution. Very mild multifocal white matter hyperintensity. Some of these foci related to the areas of acute ischemia. Others are nonspecific and commonly seen at this age. No mass lesion. No chronic microhemorrhage or cerebral amyloid angiopathy. No hydrocephalus, age advanced atrophy or lobar predominant volume loss. No dural abnormality or extra-axial collection. Skull and upper cervical spine: The visualized skull base, calvarium, upper cervical spine and extracranial soft tissues are normal. Sinuses/Orbits: Right mastoid effusion. Paranasal sinuses are clear. Normal orbits. MRA HEAD FINDINGS Intracranial internal carotid arteries: Normal. Anterior cerebral arteries: Normal. Middle cerebral arteries: Normal right MCA. There is severe proximal stenosis of the most anterior M2 branch of the left MCA (series 7 image 62), with normal distal flow-related enhancement. No clear correlate for this is seen on the maximum intensity projections. Posterior communicating arteries: Absent Posterior cerebral arteries: Normal.  Basilar artery: Normal. Vertebral arteries: Left dominant. Normal. Superior cerebellar arteries: Normal. Anterior inferior cerebellar arteries: Normal. Posterior inferior cerebellar arteries: Normal. IMPRESSION: 1. Multiple punctate foci of acute ischemia within the right frontal lobe, including the precentral gyrus, which is in the right MCA territory, and the left superior frontal 4 anterior paracentral gyrus, both of which are in the anterior cerebral artery distribution. The involvement of multiple vascular territories on the right suggest a right-sided embolic source. 2. No hemorrhage or mass effect. 3. No emergent large vessel occlusion. 4. Severe stenosis of the anterior most left M2 branch of the MCA, with normal distal flow-related enhancement. Electronically Signed   By: Ulyses Jarred M.D.   On: 01/05/2018 03:56   US Carotid Bilateral (at [facility] And [facility] Only)  Result Date: 01/05/2018 CLINICAL DATA:  TIA symptoms EXAM: BILATERAL CAROTID DUPLEX ULTRASOUND TECHNIQUE: Pearline Cables scale imaging, color Doppler and duplex ultrasound were performed of bilateral carotid and vertebral arteries in the neck. COMPARISON:  01/05/2018 MRI FINDINGS: Criteria: Quantification of carotid stenosis is based on velocity parameters that correlate the residual internal carotid diameter with NASCET-based stenosis levels, using the diameter of the distal internal carotid lumen as the denominator for stenosis measurement. The following velocity measurements were obtained: RIGHT ICA:  63/11 cm/sec CCA:  16/1 cm/sec SYSTOLIC ICA/CCA RATIO:  0.8 DIASTOLIC ICA/CCA RATIO:  1.2 ECA:  71 cm/sec LEFT ICA:  75/20 cm/sec CCA:  09/60 cm/sec SYSTOLIC ICA/CCA RATIO:  0.9 DIASTOLIC ICA/CCA RATIO:  1.7 ECA:  102 cm/sec RIGHT CAROTID ARTERY: Minor echogenic shadowing plaque  formation. No hemodynamically significant right ICA stenosis, velocity elevation, or turbulent flow. Degree of narrowing less than 50%. RIGHT VERTEBRAL ARTERY:  Antegrade LEFT CAROTID  ARTERY: Similar scattered minor echogenic plaque formation. No hemodynamically significant left ICA stenosis, velocity elevation, or turbulent flow. LEFT VERTEBRAL ARTERY:  Antegrade IMPRESSION: Minor carotid atherosclerosis. No hemodynamically significant ICA stenosis. Degree of narrowing less than 50% bilaterally by ultrasound criteria. Patent antegrade vertebral flow bilaterally Electronically Signed   By: Jerilynn Mages.  Shick M.D.   On: 01/05/2018 10:19   Mr Jodene Nam Head/brain JQ Cm  Result Date: 01/05/2018 CLINICAL DATA:  Slurred speech and left hand weakness EXAM: MRI HEAD WITHOUT CONTRAST MRA HEAD WITHOUT CONTRAST TECHNIQUE: Multiplanar, multiecho pulse sequences of the brain and surrounding structures were obtained without intravenous contrast. Angiographic images of the head were obtained using MRA technique without contrast. COMPARISON:  Head CT 01/04/2018 FINDINGS: MRI HEAD FINDINGS Brain: The midline structures are normal. There is multifocal abnormal diffusion restriction within the right frontal lobe, including the precentral gyrus and superior frontal gyrus/anterior paracentral gyrus. The latter focus is in the anterior cerebral artery distribution. The others are in the MCA distribution. Very mild multifocal white matter hyperintensity. Some of these foci related to the areas of acute ischemia. Others are nonspecific and commonly seen at this age. No mass lesion. No chronic microhemorrhage or cerebral amyloid angiopathy. No hydrocephalus, age advanced atrophy or lobar predominant volume loss. No dural abnormality or extra-axial collection. Skull and upper cervical spine: The visualized skull base, calvarium, upper cervical spine and extracranial soft tissues are normal. Sinuses/Orbits: Right mastoid effusion. Paranasal sinuses are clear. Normal orbits. MRA HEAD FINDINGS Intracranial internal carotid arteries: Normal. Anterior cerebral arteries: Normal. Middle cerebral arteries: Normal right MCA. There is severe  proximal stenosis of the most anterior M2 branch of the left MCA (series 7 image 62), with normal distal flow-related enhancement. No clear correlate for this is seen on the maximum intensity projections. Posterior communicating arteries: Absent Posterior cerebral arteries: Normal. Basilar artery: Normal. Vertebral arteries: Left dominant. Normal. Superior cerebellar arteries: Normal. Anterior inferior cerebellar arteries: Normal. Posterior inferior cerebellar arteries: Normal. IMPRESSION: 1. Multiple punctate foci of acute ischemia within the right frontal lobe, including the precentral gyrus, which is in the right MCA territory, and the left superior frontal 4 anterior paracentral gyrus, both of which are in the anterior cerebral artery distribution. The involvement of multiple vascular territories on the right suggest a right-sided embolic source. 2. No hemorrhage or mass effect. 3. No emergent large vessel occlusion. 4. Severe stenosis of the anterior most left M2 branch of the MCA, with normal distal flow-related enhancement. Electronically Signed   By: Ulyses Jarred M.D.   On: 01/05/2018 03:56   Ct Head Code Stroke Wo Contrast  Result Date: 01/04/2018 CLINICAL DATA:  Code stroke.  Slurred speech.  Facial numbness. EXAM: CT HEAD WITHOUT CONTRAST TECHNIQUE: Contiguous axial images were obtained from the base of the skull through the vertex without intravenous contrast. COMPARISON:  None. FINDINGS: Brain: No evidence of acute infarction, hemorrhage, hydrocephalus, extra-axial collection or mass lesion/mass effect. Normal for age cerebral volume. No significant white matter disease. Vascular: Calcification of the cavernous internal carotid arteries consistent with cerebrovascular atherosclerotic disease. No signs of intracranial large vessel occlusion. Skull: Normal. Negative for fracture or focal lesion. Sinuses/Orbits: No acute finding. Other: None. ASPECTS [facility] Stroke Program Early CT Score) -  Ganglionic level infarction (caudate, lentiform nuclei, internal capsule, insula, M1-M3 cortex): 7 - Supraganglionic infarction (M4-M6 cortex): 3 Total  score (0-10 with 10 being normal): 10 IMPRESSION: 1. Unremarkable CT head without contrast. 2. ASPECTS is 10. A call was placed to the emergency department by myself on 01/04/2018 at 4:58 p.m. Findings were conveyed to Dr. Cinda Quest. Electronically Signed   By: Staci Righter M.D.   On: 01/04/2018 17:01    Assessment and Plan:   Stroke Ms. Bartley presented with acute stroke and still has some residual left-sided deficits.  MRI showed multiple right cerebral infarcts, concerning for embolic phenomenon.  Transthoracic echocardiogram was technically difficult but without obvious structural abnormalities.  I think it is reasonable to obtain a transesophageal echocardiogram for further assessment for potential cardioembolic sources.  I have discussed transesophageal echocardiogram with moderate sedation with Ms. Fretz and her family, including the risks and benefits of the procedure.  If the TEE does not show a cause for her stroke, ambulatory event monitor or implantable loop recorder will need to be considered following discharge to assess for paroxysmal atrial fibrillation.  In the meantime, I agree with continuation of antiplatelet and statin therapy for secondary prevention.  For questions or updates, please contact [place] Please consult [email or web address] for contact info under [facility] cardiology.   Signed, Nelva Bush, MD  01/05/2018 5:16 PM

## 2018-01-06 ENCOUNTER — Encounter
Admission: EM | Disposition: A | Discharge status: Home / Self Care | Payer: Self-pay | Source: Home / Self Care | Attending: Emergency Medicine

## 2018-01-06 ENCOUNTER — Observation Stay (HOSPITAL_BASED_OUTPATIENT_CLINIC_OR_DEPARTMENT_OTHER)
Admit: 2018-01-06 | Discharge: 2018-01-06 | Disposition: A | Discharge status: Home / Self Care | Payer: Medicare Other | Attending: Internal Medicine | Admitting: Internal Medicine

## 2018-01-06 DIAGNOSIS — G459 Transient cerebral ischemic attack, unspecified: Secondary | ICD-10-CM | POA: Diagnosis not present

## 2018-01-06 DIAGNOSIS — I639 Cerebral infarction, unspecified: Secondary | ICD-10-CM | POA: Diagnosis not present

## 2018-01-06 DIAGNOSIS — M810 Age-related osteoporosis without current pathological fracture: Secondary | ICD-10-CM | POA: Diagnosis not present

## 2018-01-06 DIAGNOSIS — I6389 Other cerebral infarction: Secondary | ICD-10-CM | POA: Diagnosis not present

## 2018-01-06 DIAGNOSIS — J449 Chronic obstructive pulmonary disease, unspecified: Secondary | ICD-10-CM | POA: Diagnosis not present

## 2018-01-06 DIAGNOSIS — I6310 Cerebral infarction due to embolism of unspecified precerebral artery: Secondary | ICD-10-CM | POA: Diagnosis not present

## 2018-01-06 DIAGNOSIS — R260 Ataxic gait: Secondary | ICD-10-CM | POA: Diagnosis not present

## 2018-01-06 DIAGNOSIS — I700 Atherosclerosis of aorta: Secondary | ICD-10-CM

## 2018-01-06 HISTORY — PX: TEE WITHOUT CARDIOVERSION: SHX5443

## 2018-01-06 LAB — HEMOGLOBIN A1C
HEMOGLOBIN A1C: 5.8 % — AB (ref 4.8–5.6)
MEAN PLASMA GLUCOSE: 119.76 mg/dL

## 2018-01-06 SURGERY — ECHOCARDIOGRAM, TRANSESOPHAGEAL
Anesthesia: Moderate Sedation

## 2018-01-06 MED ORDER — FENTANYL CITRATE (PF) 100 MCG/2ML IJ SOLN
INTRAMUSCULAR | Status: AC
  Filled 2018-01-06: qty 2

## 2018-01-06 MED ORDER — ATORVASTATIN CALCIUM 40 MG PO TABS: 40.0000 mg | ORAL_TABLET | Freq: Every day | ORAL | 0 refills | Status: DC

## 2018-01-06 MED ORDER — BUTAMBEN-TETRACAINE-BENZOCAINE 2-2-14 % EX AERO
INHALATION_SPRAY | CUTANEOUS | Status: AC
  Filled 2018-01-06: qty 5

## 2018-01-06 MED ORDER — LIDOCAINE VISCOUS 2 % MT SOLN
OROMUCOSAL | Status: AC
  Filled 2018-01-06: qty 15

## 2018-01-06 MED ORDER — MIDAZOLAM HCL 2 MG/2ML IJ SOLN
INTRAMUSCULAR | Status: AC | PRN
  Administered 2018-01-06 (×4): 1 mg via INTRAVENOUS

## 2018-01-06 MED ORDER — MIDAZOLAM HCL 5 MG/5ML IJ SOLN
INTRAMUSCULAR | Status: AC
  Filled 2018-01-06: qty 5

## 2018-01-06 MED ORDER — FENTANYL CITRATE (PF) 100 MCG/2ML IJ SOLN
INTRAMUSCULAR | Status: AC | PRN
  Administered 2018-01-06 (×4): 25 ug via INTRAVENOUS

## 2018-01-06 MED ORDER — SODIUM CHLORIDE FLUSH 0.9 % IV SOLN
INTRAVENOUS | Status: AC
  Filled 2018-01-06: qty 10

## 2018-01-06 MED ORDER — CLOPIDOGREL BISULFATE 75 MG PO TABS: 75.0000 mg | ORAL_TABLET | Freq: Every day | ORAL | 0 refills | Status: DC

## 2018-01-06 NOTE — Care Management (Signed)
Telephone call to Kristian Covey, [facility] representative. Discharge date given Shelbie Ammons RN MSN CCM Care Management [phone]

## 2018-01-06 NOTE — Progress Notes (Signed)
    Spoke with patient and family regarding ILR. Patient will return to the [place] at 8 AM on 01/07/2018 for implantation of ILR by Dr. Caryl Comes later that morning. Risks and benefits discussed. Patient and family agree to proceed.

## 2018-01-06 NOTE — Plan of Care (Signed)
Pt. Progressing adequately. Passed swallow screen. Advanced to regular diet. Plan to discharge transitioning home with home health.

## 2018-01-06 NOTE — Progress Notes (Addendum)
Pt demonstrated swallowing effectively of ice chips and cool water. Per MD verbal order diet advanced.

## 2018-01-06 NOTE — Progress Notes (Signed)
Progress Note  Patient Name: Sharon [name] Date of Encounter: 01/06/2018  Primary Cardiologist: New to [facility]  Subjective   No complaints, Dramatic improvement in neurologic symptoms TEE complete this AM  Inpatient Medications    Scheduled Meds: . [MAR Hold] aspirin EC  81 mg Oral Daily  . [MAR Hold] atorvastatin  40 mg Oral q1800  . butamben-tetracaine-benzocaine      . [MAR Hold] cholecalciferol  2,000 Units Oral Daily  . [MAR Hold] clopidogrel  75 mg Oral Daily  . [MAR Hold] cyanocobalamin  1,000 mcg Intramuscular Q30 days  . [MAR Hold] enoxaparin (LOVENOX) injection  40 mg Subcutaneous Q24H  . fentaNYL      . [MAR Hold] guaiFENesin  600 mg Oral BID  . lidocaine      . midazolam      . [MAR Hold] mometasone-formoterol  2 puff Inhalation BID  . [MAR Hold] multivitamin  1 tablet Oral Daily  . [MAR Hold] pantoprazole  40 mg Oral Daily  . sodium chloride flush       Continuous Infusions:  PRN Meds: [MAR Hold] acetaminophen **OR** [MAR Hold] acetaminophen (TYLENOL) oral liquid 160 mg/5 mL **OR** [MAR Hold] acetaminophen, [MAR Hold] albuterol   Vital Signs    Vitals:   01/06/18 0950 01/06/18 1000 01/06/18 1015 01/06/18 1024  BP: (!) 112/55 (!) 115/46 (!) 115/51 (!) 112/49  Pulse: 68 64 66 63  Resp: 12 11 15 14   Temp:      TempSrc:      SpO2: 97% 96% 95% 95%  Weight:      Height:        Intake/Output Summary (Last 24 hours) at 01/06/2018 1215 Last data filed at 01/05/2018 1700 Gross per 24 hour  Intake 600 ml  Output -  Net 600 ml   Filed Weights   01/04/18 1639  Weight: 190 lb (86.2 kg)    Telemetry    NSR - Personally Reviewed  ECG      Physical Exam   GEN: No acute distress.   Neck: No JVD Cardiac: RRR, no murmurs, rubs, or gallops.  Respiratory: Clear to auscultation bilaterally. GI: Soft, nontender, non-distended  MS: No edema; No deformity. Neuro:  Nonfocal  Psych: Normal affect   Labs    Chemistry Recent Labs  Lab 01/04/18 1655    NA 136  K 4.2  CL 103  CO2 23  GLUCOSE 128*  BUN 21*  CREATININE 0.94  CALCIUM 9.1  PROT 7.4  ALBUMIN 4.2  AST 27  ALT 30  ALKPHOS 67  BILITOT 0.6  GFRNONAA 59*  GFRAA >60  ANIONGAP 10     Hematology Recent Labs  Lab 01/04/18 1655  WBC 10.0  RBC 4.63  HGB 14.4  HCT 42.4  MCV 91.7  MCH 31.1  MCHC 33.9  RDW 13.6  PLT 237    Cardiac Enzymes Recent Labs  Lab 01/04/18 1655  TROPONINI <0.03   No results for input(s): TROPIPOC in the last 168 hours.   BNPNo results for input(s): BNP, PROBNP in the last 168 hours.   DDimer No results for input(s): DDIMER in the last 168 hours.   Radiology    Mr Brain Wo Contrast  Result Date: 01/05/2018 CLINICAL DATA:  Slurred speech and left hand weakness EXAM: MRI HEAD WITHOUT CONTRAST MRA HEAD WITHOUT CONTRAST TECHNIQUE: Multiplanar, multiecho pulse sequences of the brain and surrounding structures were obtained without intravenous contrast. Angiographic images of the head were obtained using MRA technique  without contrast. COMPARISON:  Head CT 01/04/2018 FINDINGS: MRI HEAD FINDINGS Brain: The midline structures are normal. There is multifocal abnormal diffusion restriction within the right frontal lobe, including the precentral gyrus and superior frontal gyrus/anterior paracentral gyrus. The latter focus is in the anterior cerebral artery distribution. The others are in the MCA distribution. Very mild multifocal white matter hyperintensity. Some of these foci related to the areas of acute ischemia. Others are nonspecific and commonly seen at this age. No mass lesion. No chronic microhemorrhage or cerebral amyloid angiopathy. No hydrocephalus, age advanced atrophy or lobar predominant volume loss. No dural abnormality or extra-axial collection. Skull and upper cervical spine: The visualized skull base, calvarium, upper cervical spine and extracranial soft tissues are normal. Sinuses/Orbits: Right mastoid effusion. Paranasal sinuses are  clear. Normal orbits. MRA HEAD FINDINGS Intracranial internal carotid arteries: Normal. Anterior cerebral arteries: Normal. Middle cerebral arteries: Normal right MCA. There is severe proximal stenosis of the most anterior M2 branch of the left MCA (series 7 image 62), with normal distal flow-related enhancement. No clear correlate for this is seen on the maximum intensity projections. Posterior communicating arteries: Absent Posterior cerebral arteries: Normal. Basilar artery: Normal. Vertebral arteries: Left dominant. Normal. Superior cerebellar arteries: Normal. Anterior inferior cerebellar arteries: Normal. Posterior inferior cerebellar arteries: Normal. IMPRESSION: 1. Multiple punctate foci of acute ischemia within the right frontal lobe, including the precentral gyrus, which is in the right MCA territory, and the left superior frontal 4 anterior paracentral gyrus, both of which are in the anterior cerebral artery distribution. The involvement of multiple vascular territories on the right suggest a right-sided embolic source. 2. No hemorrhage or mass effect. 3. No emergent large vessel occlusion. 4. Severe stenosis of the anterior most left M2 branch of the MCA, with normal distal flow-related enhancement. Electronically Signed   By: Ulyses Jarred M.D.   On: 01/05/2018 03:56   US Carotid Bilateral (at [facility] Only)  Result Date: 01/05/2018 CLINICAL DATA:  TIA symptoms EXAM: BILATERAL CAROTID DUPLEX ULTRASOUND TECHNIQUE: Pearline Cables scale imaging, color Doppler and duplex ultrasound were performed of bilateral carotid and vertebral arteries in the neck. COMPARISON:  01/05/2018 MRI FINDINGS: Criteria: Quantification of carotid stenosis is based on velocity parameters that correlate the residual internal carotid diameter with NASCET-based stenosis levels, using the diameter of the distal internal carotid lumen as the denominator for stenosis measurement. The following velocity measurements were obtained: RIGHT  ICA:  63/11 cm/sec CCA:  82/9 cm/sec SYSTOLIC ICA/CCA RATIO:  0.8 DIASTOLIC ICA/CCA RATIO:  1.2 ECA:  71 cm/sec LEFT ICA:  75/20 cm/sec CCA:  93/71 cm/sec SYSTOLIC ICA/CCA RATIO:  0.9 DIASTOLIC ICA/CCA RATIO:  1.7 ECA:  102 cm/sec RIGHT CAROTID ARTERY: Minor echogenic shadowing plaque formation. No hemodynamically significant right ICA stenosis, velocity elevation, or turbulent flow. Degree of narrowing less than 50%. RIGHT VERTEBRAL ARTERY:  Antegrade LEFT CAROTID ARTERY: Similar scattered minor echogenic plaque formation. No hemodynamically significant left ICA stenosis, velocity elevation, or turbulent flow. LEFT VERTEBRAL ARTERY:  Antegrade IMPRESSION: Minor carotid atherosclerosis. No hemodynamically significant ICA stenosis. Degree of narrowing less than 50% bilaterally by ultrasound criteria. Patent antegrade vertebral flow bilaterally Electronically Signed   By: Jerilynn Mages.  Shick M.D.   On: 01/05/2018 10:19   Mr Jodene Nam Head/brain IR Cm  Result Date: 01/05/2018 CLINICAL DATA:  Slurred speech and left hand weakness EXAM: MRI HEAD WITHOUT CONTRAST MRA HEAD WITHOUT CONTRAST TECHNIQUE: Multiplanar, multiecho pulse sequences of the brain and surrounding structures were obtained without intravenous contrast. Angiographic images  of the head were obtained using MRA technique without contrast. COMPARISON:  Head CT 01/04/2018 FINDINGS: MRI HEAD FINDINGS Brain: The midline structures are normal. There is multifocal abnormal diffusion restriction within the right frontal lobe, including the precentral gyrus and superior frontal gyrus/anterior paracentral gyrus. The latter focus is in the anterior cerebral artery distribution. The others are in the MCA distribution. Very mild multifocal white matter hyperintensity. Some of these foci related to the areas of acute ischemia. Others are nonspecific and commonly seen at this age. No mass lesion. No chronic microhemorrhage or cerebral amyloid angiopathy. No hydrocephalus, age  advanced atrophy or lobar predominant volume loss. No dural abnormality or extra-axial collection. Skull and upper cervical spine: The visualized skull base, calvarium, upper cervical spine and extracranial soft tissues are normal. Sinuses/Orbits: Right mastoid effusion. Paranasal sinuses are clear. Normal orbits. MRA HEAD FINDINGS Intracranial internal carotid arteries: Normal. Anterior cerebral arteries: Normal. Middle cerebral arteries: Normal right MCA. There is severe proximal stenosis of the most anterior M2 branch of the left MCA (series 7 image 62), with normal distal flow-related enhancement. No clear correlate for this is seen on the maximum intensity projections. Posterior communicating arteries: Absent Posterior cerebral arteries: Normal. Basilar artery: Normal. Vertebral arteries: Left dominant. Normal. Superior cerebellar arteries: Normal. Anterior inferior cerebellar arteries: Normal. Posterior inferior cerebellar arteries: Normal. IMPRESSION: 1. Multiple punctate foci of acute ischemia within the right frontal lobe, including the precentral gyrus, which is in the right MCA territory, and the left superior frontal 4 anterior paracentral gyrus, both of which are in the anterior cerebral artery distribution. The involvement of multiple vascular territories on the right suggest a right-sided embolic source. 2. No hemorrhage or mass effect. 3. No emergent large vessel occlusion. 4. Severe stenosis of the anterior most left M2 branch of the MCA, with normal distal flow-related enhancement. Electronically Signed   By: Ulyses Jarred M.D.   On: 01/05/2018 03:56   Ct Head Code Stroke Wo Contrast  Result Date: 01/04/2018 CLINICAL DATA:  Code stroke.  Slurred speech.  Facial numbness. EXAM: CT HEAD WITHOUT CONTRAST TECHNIQUE: Contiguous axial images were obtained from the base of the skull through the vertex without intravenous contrast. COMPARISON:  None. FINDINGS: Brain: No evidence of acute infarction,  hemorrhage, hydrocephalus, extra-axial collection or mass lesion/mass effect. Normal for age cerebral volume. No significant white matter disease. Vascular: Calcification of the cavernous internal carotid arteries consistent with cerebrovascular atherosclerotic disease. No signs of intracranial large vessel occlusion. Skull: Normal. Negative for fracture or focal lesion. Sinuses/Orbits: No acute finding. Other: None. ASPECTS [facility] Stroke Program Early CT Score) - Ganglionic level infarction (caudate, lentiform nuclei, internal capsule, insula, M1-M3 cortex): 7 - Supraganglionic infarction (M4-M6 cortex): 3 Total score (0-10 with 10 being normal): 10 IMPRESSION: 1. Unremarkable CT head without contrast. 2. ASPECTS is 10. A call was placed to the emergency department by myself on 01/04/2018 at 4:58 p.m. Findings were conveyed to Dr. Cinda Quest. Electronically Signed   By: Staci Righter M.D.   On: 01/04/2018 17:01    Cardiac Studies   Echo, normal EF TEE: no thrombus, normal EF  Patient Profile     GEAN LAURSEN is a 72 y.o. female with a hx of asthma/COPD, cervical cancer, and anxiety who is being seen today for the evaluation of embolic stroke     Assessment & Plan    Stroke Concern for embolic based on MRI TEE this AM no sig significant thrombus/LA or appendage Discussed with Dr. Doy Mince, We  will arrange outpt placement of reveal device/loop monitor for CVA continuation of antiplatelet and statin therapy for secondary prevention for now  discussed with patients son concerning above, need to rule out other causes of CVA, such as atrial fibrillation   Total encounter time more than 25 minutes  Greater than 50% was spent in counseling and coordination of care with the patient   For questions or updates, please contact [place] Please consult [email or web address] for contact info under Cardiology/STEMI.      Signed, Ida Rogue, MD  01/06/2018, 12:15 PM

## 2018-01-06 NOTE — Procedures (Signed)
Transesophageal Echocardiogram :  Indication: CVA, embolic Requesting/ordering  physician:   Procedure: Benzocaine spray x2 and 2 mls x 2 of viscous lidocaine were given orally to provide local anesthesia to the oropharynx. The patient was positioned supine on the left side, bite block provided. The patient was moderately sedated with the doses of versed and fentanyl as detailed below.  Using digital technique an omniplane probe was advanced into the distal esophagus without incident.   Moderate sedation: 1. Sedation used:  Versed: 4 mg, Fentanyl: 100 ug 2. Time administered:     Time when patient started recovery: 3. I was face to face during this time  See report in EPIC  for complete details: In brief, transgastric imaging revealed normal LV function with no RWMAs and no mural apical thrombus.  .  Estimated ejection fraction was 55%.  Right sided cardiac chambers were normal with no evidence of pulmonary hypertension.  Imaging of the septum showed no ASD or VSD Bubble study was negative for shunt 2D and color flow confirmed no PFO  The LA was well visualized in orthogonal views.  There was no spontaneous contrast and no thrombus in the LA and LA appendage   The descending thoracic aorta had mild  mural aortic debris/atherosclerosis with no evidence of aneurysmal dilation or disection   Ida Rogue 01/06/2018 10:03 AM

## 2018-01-06 NOTE — Progress Notes (Signed)
*  PRELIMINARY RESULTS* Echocardiogram 2D Echocardiogram has been performed.  Sharon [name] 01/06/2018, 9:54 AM

## 2018-01-06 NOTE — H&P (View-Only) (Signed)
    Spoke with patient and family regarding ILR. Patient will return to the [facility] at 8 AM on 01/07/2018 for implantation of ILR by Dr. Caryl Comes later that morning. Risks and benefits discussed. Patient and family agree to proceed.

## 2018-01-06 NOTE — Progress Notes (Signed)
Subjective: No new neurological complaints.  TEE today.    Objective: Current vital signs: BP (!) 112/49   Pulse 63   Temp (!) 97.5 F (36.4 C) (Oral)   Resp 14   Ht 5\' 9"  (1.753 m)   Wt 86.2 kg (190 lb)   LMP  (LMP Unknown)   SpO2 95%   BMI 28.06 kg/m  Vital signs in last 24 hours: Temp:  [97.5 F (36.4 C)-98 F (36.7 C)] 97.5 F (36.4 C) (03/27 0841) Pulse Rate:  [63-79] 63 (03/27 1024) Resp:  [11-21] 14 (03/27 1024) BP: (97-149)/(44-82) 112/49 (03/27 1024) SpO2:  [93 %-99 %] 95 % (03/27 1024)  Intake/Output from previous day: 03/26 0701 - 03/27 0700 In: 600 [P.O.:600] Out: -  Intake/Output this shift: No intake/output data recorded. Nutritional status: Fall precautions Diet regular Room service appropriate? Yes; Fluid consistency: Thin  Neurologic Exam: Mental Status: Lethargic, oriented, thought content appropriate.  Speech fluent without evidence of aphasia.  Able to follow 3 step commands without difficulty. Cranial Nerves: II: Discs flat bilaterally; Visual fields grossly normal, pupils equal, round, reactive to light and accommodation III,IV, VI: ptosis not present, extra-ocular motions intact bilaterally V,VII: smile symmetric, facial light touch sensation decreased on the left VIII: hearing normal bilaterally IX,X: gag reflex present XI: bilateral shoulder shrug XII: midline tongue extension Motor: Right :  Upper extremity   5/5                                      Left:     Upper extremity   5/5             Lower extremity   5/5                                                  Lower extremity   5-/5   Lab Results: Basic Metabolic Panel: Recent Labs  Lab 01/04/18 1655  NA 136  K 4.2  CL 103  CO2 23  GLUCOSE 128*  BUN 21*  CREATININE 0.94  CALCIUM 9.1    Liver Function Tests: Recent Labs  Lab 01/04/18 1655  AST 27  ALT 30  ALKPHOS 67  BILITOT 0.6  PROT 7.4  ALBUMIN 4.2   No results for input(s): LIPASE, AMYLASE in the last 168  hours. No results for input(s): AMMONIA in the last 168 hours.  CBC: Recent Labs  Lab 01/04/18 1655  WBC 10.0  NEUTROABS 7.2*  HGB 14.4  HCT 42.4  MCV 91.7  PLT 237    Cardiac Enzymes: Recent Labs  Lab 01/04/18 1655  TROPONINI <0.03    Lipid Panel: Recent Labs  Lab 01/05/18 0629  CHOL 191  TRIG 54  HDL 55  CHOLHDL 3.5  VLDL 11  LDLCALC 125*    CBG: Recent Labs  Lab 01/04/18 1702  GLUCAP 114*    Microbiology: Results for orders placed or performed in visit on 10/17/16  Rapid strep screen (not at [facility])     Status: None   Collection Time: 10/17/16  2:21 PM  Result Value Ref Range Status   Strep Gp A Ag, IA W/Reflex Negative Negative Final  Culture, Group A Strep     Status: Abnormal   Collection Time: 10/17/16  2:21 PM  Result Value Ref Range Status   Strep A Culture CANCELED (A)      Comment: Test not performed. Specimen could not be located. notified Loma Sender  Result canceled by the ancillary     Coagulation Studies: Recent Labs    01/04/18 1655  LABPROT 12.0  INR 0.89    Imaging: Mr Brain Wo Contrast  Result Date: 01/05/2018 CLINICAL DATA:  Slurred speech and left hand weakness EXAM: MRI HEAD WITHOUT CONTRAST MRA HEAD WITHOUT CONTRAST TECHNIQUE: Multiplanar, multiecho pulse sequences of the brain and surrounding structures were obtained without intravenous contrast. Angiographic images of the head were obtained using MRA technique without contrast. COMPARISON:  Head CT 01/04/2018 FINDINGS: MRI HEAD FINDINGS Brain: The midline structures are normal. There is multifocal abnormal diffusion restriction within the right frontal lobe, including the precentral gyrus and superior frontal gyrus/anterior paracentral gyrus. The latter focus is in the anterior cerebral artery distribution. The others are in the MCA distribution. Very mild multifocal white matter hyperintensity. Some of these foci related to the areas of acute ischemia. Others are  nonspecific and commonly seen at this age. No mass lesion. No chronic microhemorrhage or cerebral amyloid angiopathy. No hydrocephalus, age advanced atrophy or lobar predominant volume loss. No dural abnormality or extra-axial collection. Skull and upper cervical spine: The visualized skull base, calvarium, upper cervical spine and extracranial soft tissues are normal. Sinuses/Orbits: Right mastoid effusion. Paranasal sinuses are clear. Normal orbits. MRA HEAD FINDINGS Intracranial internal carotid arteries: Normal. Anterior cerebral arteries: Normal. Middle cerebral arteries: Normal right MCA. There is severe proximal stenosis of the most anterior M2 branch of the left MCA (series 7 image 62), with normal distal flow-related enhancement. No clear correlate for this is seen on the maximum intensity projections. Posterior communicating arteries: Absent Posterior cerebral arteries: Normal. Basilar artery: Normal. Vertebral arteries: Left dominant. Normal. Superior cerebellar arteries: Normal. Anterior inferior cerebellar arteries: Normal. Posterior inferior cerebellar arteries: Normal. IMPRESSION: 1. Multiple punctate foci of acute ischemia within the right frontal lobe, including the precentral gyrus, which is in the right MCA territory, and the left superior frontal 4 anterior paracentral gyrus, both of which are in the anterior cerebral artery distribution. The involvement of multiple vascular territories on the right suggest a right-sided embolic source. 2. No hemorrhage or mass effect. 3. No emergent large vessel occlusion. 4. Severe stenosis of the anterior most left M2 branch of the MCA, with normal distal flow-related enhancement. Electronically Signed   By: Ulyses Jarred M.D.   On: 01/05/2018 03:56   US Carotid Bilateral (at [facility] Only)  Result Date: 01/05/2018 CLINICAL DATA:  TIA symptoms EXAM: BILATERAL CAROTID DUPLEX ULTRASOUND TECHNIQUE: Pearline Cables scale imaging, color Doppler and duplex ultrasound  were performed of bilateral carotid and vertebral arteries in the neck. COMPARISON:  01/05/2018 MRI FINDINGS: Criteria: Quantification of carotid stenosis is based on velocity parameters that correlate the residual internal carotid diameter with NASCET-based stenosis levels, using the diameter of the distal internal carotid lumen as the denominator for stenosis measurement. The following velocity measurements were obtained: RIGHT ICA:  63/11 cm/sec CCA:  37/1 cm/sec SYSTOLIC ICA/CCA RATIO:  0.8 DIASTOLIC ICA/CCA RATIO:  1.2 ECA:  71 cm/sec LEFT ICA:  75/20 cm/sec CCA:  69/67 cm/sec SYSTOLIC ICA/CCA RATIO:  0.9 DIASTOLIC ICA/CCA RATIO:  1.7 ECA:  102 cm/sec RIGHT CAROTID ARTERY: Minor echogenic shadowing plaque formation. No hemodynamically significant right ICA stenosis, velocity elevation, or turbulent flow. Degree of narrowing less than 50%. RIGHT VERTEBRAL ARTERY:  Antegrade  LEFT CAROTID ARTERY: Similar scattered minor echogenic plaque formation. No hemodynamically significant left ICA stenosis, velocity elevation, or turbulent flow. LEFT VERTEBRAL ARTERY:  Antegrade IMPRESSION: Minor carotid atherosclerosis. No hemodynamically significant ICA stenosis. Degree of narrowing less than 50% bilaterally by ultrasound criteria. Patent antegrade vertebral flow bilaterally Electronically Signed   By: Jerilynn Mages.  Shick M.D.   On: 01/05/2018 10:19   Mr Jodene Nam Head/brain XF Cm  Result Date: 01/05/2018 CLINICAL DATA:  Slurred speech and left hand weakness EXAM: MRI HEAD WITHOUT CONTRAST MRA HEAD WITHOUT CONTRAST TECHNIQUE: Multiplanar, multiecho pulse sequences of the brain and surrounding structures were obtained without intravenous contrast. Angiographic images of the head were obtained using MRA technique without contrast. COMPARISON:  Head CT 01/04/2018 FINDINGS: MRI HEAD FINDINGS Brain: The midline structures are normal. There is multifocal abnormal diffusion restriction within the right frontal lobe, including the precentral  gyrus and superior frontal gyrus/anterior paracentral gyrus. The latter focus is in the anterior cerebral artery distribution. The others are in the MCA distribution. Very mild multifocal white matter hyperintensity. Some of these foci related to the areas of acute ischemia. Others are nonspecific and commonly seen at this age. No mass lesion. No chronic microhemorrhage or cerebral amyloid angiopathy. No hydrocephalus, age advanced atrophy or lobar predominant volume loss. No dural abnormality or extra-axial collection. Skull and upper cervical spine: The visualized skull base, calvarium, upper cervical spine and extracranial soft tissues are normal. Sinuses/Orbits: Right mastoid effusion. Paranasal sinuses are clear. Normal orbits. MRA HEAD FINDINGS Intracranial internal carotid arteries: Normal. Anterior cerebral arteries: Normal. Middle cerebral arteries: Normal right MCA. There is severe proximal stenosis of the most anterior M2 branch of the left MCA (series 7 image 62), with normal distal flow-related enhancement. No clear correlate for this is seen on the maximum intensity projections. Posterior communicating arteries: Absent Posterior cerebral arteries: Normal. Basilar artery: Normal. Vertebral arteries: Left dominant. Normal. Superior cerebellar arteries: Normal. Anterior inferior cerebellar arteries: Normal. Posterior inferior cerebellar arteries: Normal. IMPRESSION: 1. Multiple punctate foci of acute ischemia within the right frontal lobe, including the precentral gyrus, which is in the right MCA territory, and the left superior frontal 4 anterior paracentral gyrus, both of which are in the anterior cerebral artery distribution. The involvement of multiple vascular territories on the right suggest a right-sided embolic source. 2. No hemorrhage or mass effect. 3. No emergent large vessel occlusion. 4. Severe stenosis of the anterior most left M2 branch of the MCA, with normal distal flow-related  enhancement. Electronically Signed   By: Ulyses Jarred M.D.   On: 01/05/2018 03:56   Ct Head Code Stroke Wo Contrast  Result Date: 01/04/2018 CLINICAL DATA:  Code stroke.  Slurred speech.  Facial numbness. EXAM: CT HEAD WITHOUT CONTRAST TECHNIQUE: Contiguous axial images were obtained from the base of the skull through the vertex without intravenous contrast. COMPARISON:  None. FINDINGS: Brain: No evidence of acute infarction, hemorrhage, hydrocephalus, extra-axial collection or mass lesion/mass effect. Normal for age cerebral volume. No significant white matter disease. Vascular: Calcification of the cavernous internal carotid arteries consistent with cerebrovascular atherosclerotic disease. No signs of intracranial large vessel occlusion. Skull: Normal. Negative for fracture or focal lesion. Sinuses/Orbits: No acute finding. Other: None. ASPECTS [facility] Stroke Program Early CT Score) - Ganglionic level infarction (caudate, lentiform nuclei, internal capsule, insula, M1-M3 cortex): 7 - Supraganglionic infarction (M4-M6 cortex): 3 Total score (0-10 with 10 being normal): 10 IMPRESSION: 1. Unremarkable CT head without contrast. 2. ASPECTS is 10. A call was placed to the  emergency department by myself on 01/04/2018 at 4:58 p.m. Findings were conveyed to Dr. Cinda Quest. Electronically Signed   By: Staci Righter M.D.   On: 01/04/2018 17:01    Medications:  I have reviewed the patient's current medications. Scheduled: . [MAR Hold] aspirin EC  81 mg Oral Daily  . [MAR Hold] atorvastatin  40 mg Oral q1800  . butamben-tetracaine-benzocaine      . [MAR Hold] cholecalciferol  2,000 Units Oral Daily  . [MAR Hold] clopidogrel  75 mg Oral Daily  . [MAR Hold] cyanocobalamin  1,000 mcg Intramuscular Q30 days  . [MAR Hold] enoxaparin (LOVENOX) injection  40 mg Subcutaneous Q24H  . fentaNYL      . [MAR Hold] guaiFENesin  600 mg Oral BID  . lidocaine      . midazolam      . [MAR Hold] mometasone-formoterol  2 puff  Inhalation BID  . [MAR Hold] multivitamin  1 tablet Oral Daily  . [MAR Hold] pantoprazole  40 mg Oral Daily  . sodium chloride flush        Assessment/Plan: Patient s/p TEE.  TEE unremarkable.  A1c 5.8.  Recommendations: 1.  Patient to continue ASA and Plavix 2.  Patient to be evaluated on an outpatient basis for prolonged cardiac monitoring   LOS: 0 days   Alexis Goodell, MD Neurology [phone] 01/06/2018  1:30 PM

## 2018-01-06 NOTE — Progress Notes (Signed)
Discharge instruction provided and reviewed with pt and family. No questions at time of discharge. Pt is discharging in care of Husband and son to transition home with home health care.

## 2018-01-07 ENCOUNTER — Inpatient Hospital Stay
Admission: EM | Admit: 2018-01-07 | Discharge: 2018-01-09 | DRG: 041 | Disposition: A | Discharge status: Home Health | Payer: Medicare Other | Attending: Internal Medicine | Admitting: Internal Medicine

## 2018-01-07 ENCOUNTER — Encounter
Admission: RE | Disposition: A | Discharge status: Home / Self Care | Payer: Self-pay | Source: Ambulatory Visit | Attending: Internal Medicine

## 2018-01-07 ENCOUNTER — Ambulatory Visit
Admission: RE | Admit: 2018-01-07 | Discharge: 2018-01-07 | Disposition: A | Discharge status: Home / Self Care | Payer: Medicare Other | Source: Ambulatory Visit | Attending: Emergency Medicine | Admitting: Emergency Medicine

## 2018-01-07 ENCOUNTER — Ambulatory Visit
Admission: RE | Admit: 2018-01-07 | Discharge: 2018-01-07 | Disposition: A | Discharge status: Home / Self Care | Payer: Medicare Other | Source: Ambulatory Visit | Attending: Internal Medicine | Admitting: Internal Medicine

## 2018-01-07 ENCOUNTER — Other Ambulatory Visit: Payer: Self-pay

## 2018-01-07 ENCOUNTER — Emergency Department: Payer: Medicare Other

## 2018-01-07 ENCOUNTER — Telehealth: Payer: Self-pay | Admitting: Cardiovascular Disease

## 2018-01-07 ENCOUNTER — Encounter: Payer: Self-pay | Admitting: *Deleted

## 2018-01-07 DIAGNOSIS — R29701 NIHSS score 1: Secondary | ICD-10-CM | POA: Diagnosis present

## 2018-01-07 DIAGNOSIS — K140 Glossitis: Secondary | ICD-10-CM

## 2018-01-07 DIAGNOSIS — Z823 Family history of stroke: Secondary | ICD-10-CM | POA: Insufficient documentation

## 2018-01-07 DIAGNOSIS — R200 Anesthesia of skin: Secondary | ICD-10-CM

## 2018-01-07 DIAGNOSIS — N289 Disorder of kidney and ureter, unspecified: Secondary | ICD-10-CM

## 2018-01-07 DIAGNOSIS — I639 Cerebral infarction, unspecified: Secondary | ICD-10-CM

## 2018-01-07 DIAGNOSIS — M19041 Primary osteoarthritis, right hand: Secondary | ICD-10-CM | POA: Diagnosis present

## 2018-01-07 DIAGNOSIS — Z972 Presence of dental prosthetic device (complete) (partial): Secondary | ICD-10-CM

## 2018-01-07 DIAGNOSIS — R531 Weakness: Secondary | ICD-10-CM

## 2018-01-07 DIAGNOSIS — J449 Chronic obstructive pulmonary disease, unspecified: Secondary | ICD-10-CM | POA: Diagnosis present

## 2018-01-07 DIAGNOSIS — M26609 Unspecified temporomandibular joint disorder, unspecified side: Secondary | ICD-10-CM | POA: Insufficient documentation

## 2018-01-07 DIAGNOSIS — E860 Dehydration: Secondary | ICD-10-CM | POA: Diagnosis present

## 2018-01-07 DIAGNOSIS — Z833 Family history of diabetes mellitus: Secondary | ICD-10-CM

## 2018-01-07 DIAGNOSIS — F419 Anxiety disorder, unspecified: Secondary | ICD-10-CM | POA: Diagnosis present

## 2018-01-07 DIAGNOSIS — Z888 Allergy status to other drugs, medicaments and biological substances status: Secondary | ICD-10-CM | POA: Insufficient documentation

## 2018-01-07 DIAGNOSIS — R202 Paresthesia of skin: Secondary | ICD-10-CM

## 2018-01-07 DIAGNOSIS — F411 Generalized anxiety disorder: Secondary | ICD-10-CM | POA: Diagnosis present

## 2018-01-07 DIAGNOSIS — M810 Age-related osteoporosis without current pathological fracture: Secondary | ICD-10-CM

## 2018-01-07 DIAGNOSIS — Z7982 Long term (current) use of aspirin: Secondary | ICD-10-CM | POA: Diagnosis not present

## 2018-01-07 DIAGNOSIS — Z8541 Personal history of malignant neoplasm of cervix uteri: Secondary | ICD-10-CM

## 2018-01-07 DIAGNOSIS — Z87891 Personal history of nicotine dependence: Secondary | ICD-10-CM | POA: Insufficient documentation

## 2018-01-07 DIAGNOSIS — Z8249 Family history of ischemic heart disease and other diseases of the circulatory system: Secondary | ICD-10-CM | POA: Insufficient documentation

## 2018-01-07 DIAGNOSIS — Z90710 Acquired absence of both cervix and uterus: Secondary | ICD-10-CM | POA: Diagnosis not present

## 2018-01-07 DIAGNOSIS — Z882 Allergy status to sulfonamides status: Secondary | ICD-10-CM | POA: Diagnosis not present

## 2018-01-07 DIAGNOSIS — Z91018 Allergy to other foods: Secondary | ICD-10-CM

## 2018-01-07 DIAGNOSIS — Z7902 Long term (current) use of antithrombotics/antiplatelets: Secondary | ICD-10-CM | POA: Diagnosis not present

## 2018-01-07 DIAGNOSIS — Z886 Allergy status to analgesic agent status: Secondary | ICD-10-CM | POA: Insufficient documentation

## 2018-01-07 DIAGNOSIS — M19042 Primary osteoarthritis, left hand: Secondary | ICD-10-CM | POA: Insufficient documentation

## 2018-01-07 DIAGNOSIS — Z791 Long term (current) use of non-steroidal anti-inflammatories (NSAID): Secondary | ICD-10-CM | POA: Diagnosis not present

## 2018-01-07 DIAGNOSIS — I69354 Hemiplegia and hemiparesis following cerebral infarction affecting left non-dominant side: Principal | ICD-10-CM

## 2018-01-07 DIAGNOSIS — I6389 Other cerebral infarction: Secondary | ICD-10-CM | POA: Diagnosis not present

## 2018-01-07 DIAGNOSIS — Z79899 Other long term (current) drug therapy: Secondary | ICD-10-CM

## 2018-01-07 DIAGNOSIS — Z881 Allergy status to other antibiotic agents status: Secondary | ICD-10-CM

## 2018-01-07 DIAGNOSIS — Z91040 Latex allergy status: Secondary | ICD-10-CM | POA: Insufficient documentation

## 2018-01-07 DIAGNOSIS — I69398 Other sequelae of cerebral infarction: Secondary | ICD-10-CM

## 2018-01-07 DIAGNOSIS — M1990 Unspecified osteoarthritis, unspecified site: Secondary | ICD-10-CM

## 2018-01-07 DIAGNOSIS — I69320 Aphasia following cerebral infarction: Secondary | ICD-10-CM

## 2018-01-07 DIAGNOSIS — G459 Transient cerebral ischemic attack, unspecified: Secondary | ICD-10-CM | POA: Diagnosis present

## 2018-01-07 DIAGNOSIS — N179 Acute kidney failure, unspecified: Secondary | ICD-10-CM | POA: Diagnosis present

## 2018-01-07 DIAGNOSIS — I10 Essential (primary) hypertension: Secondary | ICD-10-CM | POA: Diagnosis not present

## 2018-01-07 DIAGNOSIS — Z7951 Long term (current) use of inhaled steroids: Secondary | ICD-10-CM

## 2018-01-07 HISTORY — PX: LOOP RECORDER INSERTION: EP1214

## 2018-01-07 LAB — COMPREHENSIVE METABOLIC PANEL
ALBUMIN: 3.8 g/dL (ref 3.5–5.0)
ALT: 21 U/L (ref 14–54)
AST: 22 U/L (ref 15–41)
Alkaline Phosphatase: 72 U/L (ref 38–126)
Anion gap: 9 (ref 5–15)
BUN: 23 mg/dL — AB (ref 6–20)
CALCIUM: 8.8 mg/dL — AB (ref 8.9–10.3)
CO2: 25 mmol/L (ref 22–32)
CREATININE: 1.37 mg/dL — AB (ref 0.44–1.00)
Chloride: 103 mmol/L (ref 101–111)
GFR, EST AFRICAN AMERICAN: 43 mL/min — AB (ref >60)
GFR, EST NON AFRICAN AMERICAN: 38 mL/min — AB (ref >60)
Glucose, Bld: 125 mg/dL — ABNORMAL HIGH (ref 65–99)
Potassium: 4 mmol/L (ref 3.5–5.1)
SODIUM: 137 mmol/L (ref 135–145)
Total Bilirubin: 0.5 mg/dL (ref 0.3–1.2)
Total Protein: 7 g/dL (ref 6.5–8.1)

## 2018-01-07 LAB — CBC
HCT: 43.6 % (ref 35.0–47.0)
Hemoglobin: 14.4 g/dL (ref 12.0–16.0)
MCH: 30.4 pg (ref 26.0–34.0)
MCHC: 33.1 g/dL (ref 32.0–36.0)
MCV: 92 fL (ref 80.0–100.0)
PLATELETS: 229 10*3/uL (ref 150–440)
RBC: 4.73 MIL/uL (ref 3.80–5.20)
RDW: 13.6 % (ref 11.5–14.5)
WBC: 9.9 10*3/uL (ref 3.6–11.0)

## 2018-01-07 LAB — DIFFERENTIAL
Basophils Absolute: 0 10*3/uL (ref 0–0.1)
Basophils Relative: 0 %
EOS ABS: 0.1 10*3/uL (ref 0–0.7)
EOS PCT: 1 %
Lymphocytes Relative: 19 %
Lymphs Abs: 1.9 10*3/uL (ref 1.0–3.6)
Monocytes Absolute: 1 10*3/uL — ABNORMAL HIGH (ref 0.2–0.9)
Monocytes Relative: 10 %
NEUTROS PCT: 70 %
Neutro Abs: 6.9 10*3/uL — ABNORMAL HIGH (ref 1.4–6.5)

## 2018-01-07 LAB — APTT: aPTT: 26 seconds (ref 24–36)

## 2018-01-07 LAB — PROTIME-INR
INR: 0.92
Prothrombin Time: 12.3 seconds (ref 11.4–15.2)

## 2018-01-07 LAB — TROPONIN I

## 2018-01-07 SURGERY — LOOP RECORDER INSERTION
Anesthesia: LOCAL

## 2018-01-07 MED ORDER — LORAZEPAM 2 MG/ML IJ SOLN
0.5000 mg | Freq: Once | INTRAMUSCULAR | Status: AC
  Administered 2018-01-07: 0.5 mg via INTRAVENOUS

## 2018-01-07 MED ORDER — LORAZEPAM 2 MG/ML IJ SOLN
INTRAMUSCULAR | Status: AC
  Filled 2018-01-07: qty 1

## 2018-01-07 MED ORDER — SODIUM CHLORIDE 0.9 % IV BOLUS
1000.0000 mL | Freq: Once | INTRAVENOUS | Status: AC
  Administered 2018-01-07: 1000 mL via INTRAVENOUS

## 2018-01-07 SURGICAL SUPPLY — 2 items
LOOP REVEAL LINQSYS (Prosthesis & Implant Heart) ×2 IMPLANT
PACK LOOP INSERTION (CUSTOM PROCEDURE TRAY) ×2 IMPLANT

## 2018-01-07 NOTE — Telephone Encounter (Signed)
LVM for call back to discuss scheduling ILR wound check in [place].

## 2018-01-07 NOTE — Telephone Encounter (Signed)
Patient calling in regards to loop monitor  Patient will need a wound check and was told she would be able to be checked at the same time of Dr Rockey Situ appt on 4/3 Stated we do wound checks at [facility] office - patient states she will be unable to go to [place] Please advise

## 2018-01-07 NOTE — Consult Note (Signed)
[facility] Tele[facility]urology Consult Services   Asked to see this patient in telemedicine consultation. Consultation was performed with assistance of ancillary / medical staff at bedside. ?   Verbal consent to perform the examination with telemedicine was obtained. Patient and family agreed to proceed with the consultation.   Impression: Stroke  Recent admission for punctate R hemisphere strokes  Fluctuating L side symptoms since then, escalated at 1900 tonight  Currently NIHSS 1 for sensory changes  CT head no acute process, MRI brain from 2 days ago reviewed with + punctate acute strokes  LINQ monitor placed yesterday  Not a tpa candidate due to:  Recent stroke  Not an NIR candidate due to:  Recent stroke  Differential Diagnosis:   Stroke  TIA  Seizure  Migraine   Metrics:  Arrival time:  2009  [facility] contacted:  2039  [facility] at bedside:  2045  NIHSS assessment time:  2048  Recommendations:    Antiplatelet therapy with ASA + Plavix OK  DVT Prophylaxis  Dysphagia Screen  Inpatient diagnostic testing to consider includes:  Repeat MRI brain  Inpatient neurology consultation  Discussed with ED MD     CC:  Stroke Alert  Date of Consult:  01/07/18    HPI:  Admission earlier this week due to LUE symptoms  Initially involuntary movements, then numbness>weakness  Work-up showed multiple R brain punctate strokes  Work-up unrevealing, LINQ cardiac monitor placed yesterday and discharged yesterday evening  Stable today until 1900 when L side numbness escalated  This prompted her to return to ED for further evaluation  Arrived at 2009  Call to teleneuro at 2039  Connected at 2045  eval at 2048  NIHSS 1  No tPA or intervention as acute stroke earlier this week (within last 90 days)   Past Medical History:  Diagnosis Date  . Anxiety   . Arthritis    hands, upper back  . Asthma   . COPD (chronic  obstructive pulmonary disease) ([place])   . History of cervical cancer   . Menopausal disorder   . Osteoporosis   . Pneumonia 1960  . Spasm of abdominal muscles of right side    intermittent  . TMJ (dislocation of temporomandibular joint)   . Wears dentures    partial lower    Prior to Admission medications   Medication Sig Start Date End Date Taking? Authorizing Provider  albuterol (PROAIR HFA) 108 (90 Base) MCG/ACT inhaler Inhale 2 puffs into the lungs every 6 (six) hours as needed for wheezing or shortness of breath. 07/10/17   Kathrine Haddock, NP  aspirin EC 81 MG tablet Take 81 mg by mouth every other day.     [provider]  atorvastatin (LIPITOR) 40 MG tablet Take 1 tablet (40 mg total) by mouth daily at 6 PM. 01/06/18   Hillary Bow, MD  b complex vitamins capsule Take 1 capsule by mouth daily.    [provider]  budesonide-formoterol (SYMBICORT) 160-4.5 MCG/ACT inhaler Inhale 2 puffs into the lungs 2 (two) times daily.    [provider]  Cholecalciferol (VITAMIN D) 2000 units tablet Take 2,000 Units by mouth daily.    [provider]  clopidogrel (PLAVIX) 75 MG tablet Take 1 tablet (75 mg total) by mouth daily. 01/06/18 02/05/18  Hillary Bow, MD  Cyanocobalamin (B-12 PO) Take 1 tablet by mouth daily.    [provider]  Cyanocobalamin 1000 MCG/ML KIT Inject 1,000 mcg as directed every 30 (thirty) days.  [provider]  estradiol (CLIMARA - DOSED IN MG/24 HR) 0.1 mg/24hr patch Place 0.1 mg onto the skin 2 (two) times a week.    [provider]  guaiFENesin (MUCINEX) 600 MG 12 hr tablet Take 1 tablet (600 mg total) by mouth 2 (two) times daily. 11/02/17   Lane, Rachel Elizabeth, PA-C  MELOXICAM PO Take 1 tablet by mouth daily.    [provider]  Multiple Vitamin (MULTIVITAMIN) tablet Take 2 tablets by mouth daily.     [provider]  omeprazole (PRILOSEC) 40 MG capsule Take 40 mg by mouth daily.  03/02/17   [provider]  SUPER GARLIC PO Take 2 tablets by mouth daily.    [provider]  Tiotropium Bromide Monohydrate (SPIRIVA RESPIMAT) 2.5 MCG/ACT AERS Inhale 2 puffs into the lungs daily. Patient not taking: Reported on 01/04/2018 11/25/17   Wicker, [name], NP     Allergies  Allergen Reactions  . Librium [Chlordiazepoxide] Itching    Dizziness   . Bactrim [Sulfamethoxazole-Trimethoprim] Nausea And Vomiting  . Ibuprofen Rash    Mouth swelling  . Latex Rash    Some bandaids, some gloves  . Naprosyn [Naproxen] Rash    Mouth swelling  . Other Rash    Brazil nuts - mouth swelling  . Strawberry Extract Rash    Social History   Tobacco Use  . Smoking status: Former Smoker    Packs/day: 0.60    Years: 56.00    Pack years: 33.60    Types: Cigarettes    Last attempt to quit: 07/14/2016    Years since quitting: 1.4  . Smokeless tobacco: Never Used  . Tobacco comment: has smoked off and on  Substance Use Topics  . Alcohol use: Yes    Alcohol/week: 0.0 oz    Comment: occasionally - special occasions  . Drug use: No    Family History  Problem Relation Age of Onset  . Diabetes Mother   . Heart disease Mother   . Stroke Mother   . Stroke Maternal Grandmother         Physical Exam  Vitals:   01/07/18 0842 01/07/18 2041  BP: 126/61 (!) 119/57  Pulse: 79 82  Resp: 14 18  Temp: 98.1 F (36.7 C) 98.6 F (37 C)  SpO2: 98% 97%     NIHSS  LOC - 0  LOC questions - 0  LOC commands - 0  EOM - 0  VF - 0  Face - 0  Motor Upper Ext - 0  Motor Lower Ext - 0  Coordination - 0  Sensory - 1  Language - 0  Speech - 0  Extinction - 0  NIHSS total - 1   Lab/Data Review  CT Head - reviewed - no acute process      Medical Decision Making:  - Extensive number of diagnosis or management options are considered above.   - Extensive amount of complex data reviewed.   - High risk of complication and/or morbidity or mortality  are associated with differential diagnostic considerations above.  - There may be Uncertain outcome and increased probability of prolonged functional impairment or high probability of severe prolonged functional impairment associated with some of these differential diagnosis.  Medical Data Reviewed:  1.Data reviewed include clinical labs, radiology,  Medical Tests;   2.Tests results discussed w/performing or interpreting physician;   3.Obtaining/reviewing old medical records;  4.Obtaining case history from another source;  5.Independent review of image, tracing or specimen.       

## 2018-01-07 NOTE — Interval H&P Note (Signed)
History and Physical Interval Note:  01/07/2018 8:58 AM  Sharon [name]  has presented today for surgery, with the diagnosis of Loop Recorder Insertion  Bedside  [employer] Rep  cc: Brayton El  The various methods of treatment have been discussed with the patient and family. After consideration of risks, benefits and other options for treatment, the patient has consented to  Procedure(s): LOOP RECORDER INSERTION (N/A) as a surgical intervention .  The patient's history has been reviewed, patient examined, no change in status, stable for surgery.  I have reviewed the patient's chart and labs.  Questions were answered to the patient's satisfaction.     Virl Axe  Pt examined and plan reviewed

## 2018-01-07 NOTE — ED Triage Notes (Signed)
Pt brought in by husband.  Pt last known well 1927 today.  Pt stating she is having L sided weakness.  Pt was discharged from hospital last night from recent stroke.  Pt also had loop recorded place this morning.

## 2018-01-07 NOTE — ED Triage Notes (Signed)
Patient c/o left-side numbness. Patient has decreased grip left, and left-side droop. Patient just released from hospital from [facility] for TIA yesterday. Patient had internal cardiac monitor placed yesterday.

## 2018-01-07 NOTE — ED Notes (Signed)
Pt given 0.5 mg of ativan IV per EDP request to help with anxiety.  Originally charted in wrong case number, only one dose given to patient.

## 2018-01-07 NOTE — Telephone Encounter (Signed)
To [facility] to review.  

## 2018-01-07 NOTE — Progress Notes (Signed)
[place] responded to code to emergency room. [place] inquired about family. [facility] provided silent prayer. [place] spoke with EMS and nurse. Medical staff in room with patient. [place] provided silent prayer and is available for follow-up per request.

## 2018-01-07 NOTE — ED Provider Notes (Signed)
[facility] Emergency Department Provider Note  ____________________________________________  Time seen: Approximately 8:41 PM  I have reviewed the triage vital signs and the nursing notes.   HISTORY  Chief Complaint Code Stroke    HPI Sharon [name] is a 72 y.o. female only discharged on aspirin and Plavix after CVA presenting for worsening left upper extremity weakness, left lower extremity heaviness.  The patient reports that 1 hour prior to arrival, she noted that her left upper extremity was tingly and numb and that she had to use her right arm to move her left arm around.  She also notes heaviness in the left leg.  She reports numbness in the left face up to the eyeball, left upper extremity and left lower extremity numbness.  She has a mild posterior "discomfort" in her head.  She has had blurred vision in the left eye since her admission and this is unchanged.  She has not had any nausea or vomiting, dysuria, fever or chills, or trauma.  At home, the patient took aspirin and has had her Plavix today.  The patient states that she is feeling very anxious.    Past Medical History:  Diagnosis Date  . Anxiety   . Arthritis    hands, upper back  . Asthma   . COPD (chronic obstructive pulmonary disease) ([place])   . History of cervical cancer   . Menopausal disorder   . Osteoporosis   . Pneumonia 1960  . Spasm of abdominal muscles of right side    intermittent  . TMJ (dislocation of temporomandibular joint)   . Wears dentures    partial lower    Patient Active Problem List   Diagnosis Date Noted  . Cerebrovascular accident (CVA) due to embolism of precerebral artery ([place])   . TIA (transient ischemic attack) 01/04/2018  . Piriformis syndrome, left 11/25/2017  . CAD (coronary artery disease) 11/25/2017  . Caregiver stress 11/25/2017  . Menopause 08/14/2017  . Weight gain 08/14/2017  . De Quervain's tenosynovitis, right 07/17/2017  . Stress incontinence  06/19/2017  . Personal history of tobacco use, presenting hazards to health 04/01/2017  . Anxiety 03/13/2017  . Glossitis 11/14/2016  . Advanced care planning/counseling discussion 10/17/2016  . Other fecal abnormalities   . Benign neoplasm of sigmoid colon   . COPD (chronic obstructive pulmonary disease) ([place]) 10/17/2015  . Mass of upper inner quadrant of right breast 06/22/2015    Past Surgical History:  Procedure Laterality Date  . ABDOMINAL HYSTERECTOMY  1970's  . bladder botox  2005  . BLADDER SUSPENSION  2004  . COLONOSCOPY WITH PROPOFOL N/A 12/13/2015   Procedure: COLONOSCOPY WITH PROPOFOL;  Surgeon: Lucilla Lame, MD;  Location: [facility];  Service: Endoscopy;  Laterality: N/A;  . LOOP RECORDER INSERTION N/A 01/07/2018   Procedure: LOOP RECORDER INSERTION;  Surgeon: Deboraha Sprang, MD;  Location: [facility];  Service: Cardiovascular;  Laterality: N/A;  . POLYPECTOMY N/A 12/13/2015   Procedure: POLYPECTOMY;  Surgeon: Lucilla Lame, MD;  Location: [place];  Service: Endoscopy;  Laterality: N/A;  SIGMOID COLON POLYPS X  5  . SHOULDER ARTHROSCOPY W/ ROTATOR CUFF REPAIR Right 1998  . TEE WITHOUT CARDIOVERSION N/A 01/06/2018   Procedure: TRANSESOPHAGEAL ECHOCARDIOGRAM (TEE);  Surgeon: Minna Merritts, MD;  Location: [facility];  Service: Cardiovascular;  Laterality: N/A;  . TONSILLECTOMY AND ADENOIDECTOMY        Allergies Librium [chlordiazepoxide]; Bactrim [sulfamethoxazole-trimethoprim]; Ibuprofen; Latex; Naprosyn [naproxen]; Other; and Strawberry extract  Family History  Problem Relation Age of Onset  . Diabetes Mother   . Heart disease Mother   . Stroke Mother   . Stroke Maternal Grandmother     Social History Social History   Tobacco Use  . Smoking status: Former Smoker    Packs/day: 0.60    Years: 56.00    Pack years: 33.60    Types: Cigarettes    Last attempt to quit: 07/14/2016    Years since quitting: 1.4  . Smokeless tobacco: Never  Used  . Tobacco comment: has smoked off and on  Substance Use Topics  . Alcohol use: Yes    Alcohol/week: 0.0 oz    Comment: occasionally - special occasions  . Drug use: No    Review of Systems Constitutional: No fever/chills.  No lightheadedness or syncope. Eyes: Continued blurred vision of the left eye. ENT: No sore throat. No congestion or rhinorrhea. Cardiovascular: Denies chest pain. Denies palpitations. Respiratory: Denies shortness of breath.  No cough. Gastrointestinal: No abdominal pain.  No nausea, no vomiting.  No diarrhea.  No constipation. Genitourinary: Negative for dysuria. Musculoskeletal: Negative for back pain. Skin: Negative for rash. Neurological: Positive posterior head discomfort.  Positive left upper extremity and left lower extremity weakness, numbness and tingling.  Positive left face numbness. Psychiatric:Positive anxiety.   ____________________________________________   PHYSICAL EXAM:  VITAL SIGNS: ED Triage Vitals [01/07/18 0842]  Enc Vitals Group     BP 126/61     Pulse Rate 79     Resp 14     Temp 98.1 F (36.7 C)     Temp Source Oral     SpO2 98 %     Weight 190 lb (86.2 kg)     Height 5\' 9"  (1.753 m)     Head Circumference      Peak Flow      Pain Score 0     Pain Loc      Pain Edu?      Excl. in [place]?     Constitutional: Alert and oriented.  She is anxious appearing but nontoxic.  Answers questions appropriately. Eyes: Conjunctivae are normal.  EOMI. PERRLA.  No scleral icterus. Head: Atraumatic. Nose: No congestion/rhinnorhea. Mouth/Throat: Mucous membranes are moist.  Neck: No stridor.  Supple.  No JVD.  No meningismus. Cardiovascular: Normal rate, regular rhythm. No murmurs, rubs or gallops.  Respiratory: Normal respiratory effort.  No accessory muscle use or retractions. Lungs CTAB.  No wheezes, rales or ronchi. Gastrointestinal: Soft, nontender and nondistended.  No guarding or rebound.  No peritoneal  signs. Musculoskeletal: No LE edema. No ttp in the calves or palpable cords.  Negative Homan's sign. Neurologic: Alert and oriented 3. Speech is clear. Naming and repetition are intact. Face and smile symmetric. Tongue is midline. Normal cheek puff.  Normal eyebrow raise.  EOMI and PERRLA.  Positive left pronator drift. 5 out of 5 right grip, bilateral biceps, bilateral triceps, right hip flexors, plantar flexion and dorsiflexion.  4+ out of 5 left grip and left hip flexor decreased sensation to light touch in the left upper and left extremities, and left face. Normal heel-to-shin although this examination is somewhat limited due to the weakness the patient has in her left lower extremity. Skin:  Skin is warm, dry and intact. No rash noted. Psychiatric: Anxious mood and affect ____________________________________________   LABS (all labs ordered are listed, but only abnormal results are displayed)  Labs Reviewed  ETHANOL  PROTIME-INR  APTT  CBC  DIFFERENTIAL  TROPONIN I  URINE DRUG SCREEN, QUALITATIVE ([facility] ONLY)  URINALYSIS, ROUTINE W REFLEX MICROSCOPIC  COMPREHENSIVE METABOLIC PANEL  CBG MONITORING, ED   ____________________________________________  EKG ED ECG REPORT I, Eula Listen, the attending physician, personally viewed and interpreted this ECG.   Date: 01/07/2018  EKG Time: 2040  Rate: 78  Rhythm: normal sinus rhythm; + PAC  Axis: normal  Intervals:none  ST&T Change: No STEMI  ____________________________________________  RADIOLOGY  Ct Head Wo Contrast  Result Date: 01/07/2018 CLINICAL DATA:  Acute presentation with left-sided weakness and numbness. EXAM: CT HEAD WITHOUT CONTRAST TECHNIQUE: Contiguous axial images were obtained from the base of the skull through the vertex without intravenous contrast. COMPARISON:  MRI 2 days ago. FINDINGS: Brain: Appearance of the brain remains normal. No sign of acute infarction, mass lesion, hemorrhage, hydrocephalus or  extra-axial collection. Punctate acute infarctions in the right MCA territory shown on the recent MRI are not visible. Vascular: There is atherosclerotic calcification of the major vessels at the base of the brain. Skull: Normal Sinuses/Orbits: Clear/normal Other: None IMPRESSION: Head CT remains normal for age. MRI 2 days ago showed several punctate foci of acute infarction in the right MCA territory, but these are below the resolution of CT. Electronically Signed   By: Nelson Chimes M.D.   On: 01/07/2018 20:27    ____________________________________________   PROCEDURES  Procedure(s) performed: None  Procedures  Critical Care performed: No ____________________________________________   INITIAL IMPRESSION / ASSESSMENT AND PLAN / ED COURSE  Pertinent labs & imaging results that were available during my care of the patient were reviewed by me and considered in my medical decision making (see chart for details).  72 y.o. female with recent admission for acute CVA presenting with acute worsening of her left upper extremity, left lower extremity weakness and numbness, and left facial numbness.  Overall, the patient does have some deficit and this may be concerning for worsening stroke but other etiologies are possible including intracranial bleed, infection including UTI, electrolyte abnormality, oncologic reasons for her symptoms.  Patient CT head from tonight does not show any acute process and an MRI has been ordered.  Patient will be admitted to the hospital for further evaluation and treatment.   ____________________________________________  FINAL CLINICAL IMPRESSION(S) / ED DIAGNOSES  Final diagnoses:  Acute left-sided weakness  Left sided numbness  Anxiety state         NEW MEDICATIONS STARTED DURING THIS VISIT:  Current Discharge Medication List        Eula Listen, MD 01/07/18 [phone]

## 2018-01-08 ENCOUNTER — Other Ambulatory Visit: Payer: Self-pay

## 2018-01-08 ENCOUNTER — Encounter: Payer: Self-pay | Admitting: *Deleted

## 2018-01-08 DIAGNOSIS — G459 Transient cerebral ischemic attack, unspecified: Secondary | ICD-10-CM

## 2018-01-08 LAB — LIPID PANEL
CHOL/HDL RATIO: 2.9 ratio
CHOLESTEROL: 158 mg/dL (ref 0–200)
HDL: 54 mg/dL (ref >40)
LDL Cholesterol: 95 mg/dL (ref 0–99)
TRIGLYCERIDES: 45 mg/dL (ref <150)
VLDL: 9 mg/dL (ref 0–40)

## 2018-01-08 LAB — HEMOGLOBIN A1C
HEMOGLOBIN A1C: 5.7 % — AB (ref 4.8–5.6)
Mean Plasma Glucose: 116.89 mg/dL

## 2018-01-08 MED ORDER — GUAIFENESIN ER 600 MG PO TB12
600.0000 mg | ORAL_TABLET | Freq: Two times a day (BID) | ORAL | Status: DC
  Administered 2018-01-08 – 2018-01-09 (×3): 600 mg via ORAL
  Filled 2018-01-08 (×3): qty 1

## 2018-01-08 MED ORDER — MOMETASONE FURO-FORMOTEROL FUM 200-5 MCG/ACT IN AERO
2.0000 | INHALATION_SPRAY | Freq: Two times a day (BID) | RESPIRATORY_TRACT | Status: DC
  Administered 2018-01-08 – 2018-01-09 (×3): 2 via RESPIRATORY_TRACT
  Filled 2018-01-08: qty 8.8

## 2018-01-08 MED ORDER — ENOXAPARIN SODIUM 40 MG/0.4ML ~~LOC~~ SOLN
40.0000 mg | SUBCUTANEOUS | Status: DC
  Filled 2018-01-08: qty 0.4

## 2018-01-08 MED ORDER — PANTOPRAZOLE SODIUM 40 MG PO TBEC
40.0000 mg | DELAYED_RELEASE_TABLET | Freq: Every day | ORAL | Status: DC
  Administered 2018-01-08 – 2018-01-09 (×2): 40 mg via ORAL
  Filled 2018-01-08 (×2): qty 1

## 2018-01-08 MED ORDER — CLOPIDOGREL BISULFATE 75 MG PO TABS
75.0000 mg | ORAL_TABLET | Freq: Every day | ORAL | Status: DC
  Administered 2018-01-08 – 2018-01-09 (×2): 75 mg via ORAL
  Filled 2018-01-08 (×2): qty 1

## 2018-01-08 MED ORDER — B COMPLEX VITAMINS PO CAPS: 1.0000 | ORAL_CAPSULE | Freq: Every day | ORAL | Status: DC

## 2018-01-08 MED ORDER — ESTRADIOL 0.1 MG/24HR TD PTWK: 0.1000 mg | MEDICATED_PATCH | TRANSDERMAL | Status: DC

## 2018-01-08 MED ORDER — ALBUTEROL SULFATE (2.5 MG/3ML) 0.083% IN NEBU: 2.5000 mg | INHALATION_SOLUTION | Freq: Four times a day (QID) | RESPIRATORY_TRACT | Status: DC | PRN

## 2018-01-08 MED ORDER — VITAMIN B-12 1000 MCG PO TABS
500.0000 ug | ORAL_TABLET | Freq: Every day | ORAL | Status: DC
  Administered 2018-01-09: 08:00:00 500 ug via ORAL
  Filled 2018-01-08: qty 1

## 2018-01-08 MED ORDER — VITAMIN D 1000 UNITS PO TABS
2000.0000 [IU] | ORAL_TABLET | Freq: Every day | ORAL | Status: DC
  Administered 2018-01-08 – 2018-01-09 (×2): 2000 [IU] via ORAL
  Filled 2018-01-08 (×2): qty 2

## 2018-01-08 MED ORDER — VITAMIN B-12 100 MCG PO TABS
ORAL_TABLET | Freq: Every day | ORAL | Status: DC
  Filled 2018-01-08 (×2): qty 1

## 2018-01-08 MED ORDER — SENNOSIDES-DOCUSATE SODIUM 8.6-50 MG PO TABS: 1.0000 | ORAL_TABLET | Freq: Every evening | ORAL | Status: DC | PRN

## 2018-01-08 MED ORDER — TIOTROPIUM BROMIDE MONOHYDRATE 18 MCG IN CAPS
18.0000 ug | ORAL_CAPSULE | Freq: Every day | RESPIRATORY_TRACT | Status: DC
  Administered 2018-01-08 – 2018-01-09 (×2): 18 ug via RESPIRATORY_TRACT
  Filled 2018-01-08: qty 5

## 2018-01-08 MED ORDER — B COMPLEX-C PO TABS
1.0000 | ORAL_TABLET | Freq: Every day | ORAL | Status: DC
  Administered 2018-01-09: 08:00:00 1 via ORAL
  Filled 2018-01-08 (×3): qty 1

## 2018-01-08 MED ORDER — ACETAMINOPHEN 325 MG PO TABS: 650.0000 mg | ORAL_TABLET | ORAL | Status: DC | PRN

## 2018-01-08 MED ORDER — HEPARIN SODIUM (PORCINE) 5000 UNIT/ML IJ SOLN
5000.0000 [IU] | Freq: Three times a day (TID) | INTRAMUSCULAR | Status: DC
  Filled 2018-01-08: qty 1

## 2018-01-08 MED ORDER — ACETAMINOPHEN 650 MG RE SUPP: 650.0000 mg | RECTAL | Status: DC | PRN

## 2018-01-08 MED ORDER — SODIUM CHLORIDE 0.9 % IV SOLN
Freq: Once | INTRAVENOUS | Status: AC
  Administered 2018-01-08: 01:00:00 via INTRAVENOUS

## 2018-01-08 MED ORDER — ACETAMINOPHEN 160 MG/5ML PO SOLN
650.0000 mg | ORAL | Status: DC | PRN
  Filled 2018-01-08: qty 20.3

## 2018-01-08 MED ORDER — ATORVASTATIN CALCIUM 20 MG PO TABS
40.0000 mg | ORAL_TABLET | Freq: Every day | ORAL | Status: DC
  Administered 2018-01-08: 16:00:00 40 mg via ORAL
  Filled 2018-01-08: qty 2

## 2018-01-08 MED ORDER — ADULT MULTIVITAMIN W/MINERALS CH
2.0000 | ORAL_TABLET | Freq: Every day | ORAL | Status: DC
  Administered 2018-01-08: 09:00:00 1 via ORAL
  Filled 2018-01-08 (×2): qty 2

## 2018-01-08 MED ORDER — STROKE: EARLY STAGES OF RECOVERY BOOK
Freq: Once | Status: AC
  Administered 2018-01-08: 01:00:00

## 2018-01-08 MED ORDER — CYANOCOBALAMIN 1000 MCG/ML IJ SOLN: 1000.0000 ug | INTRAMUSCULAR | Status: DC

## 2018-01-08 MED ORDER — ASPIRIN EC 81 MG PO TBEC
81.0000 mg | DELAYED_RELEASE_TABLET | ORAL | Status: DC
  Administered 2018-01-08 – 2018-01-09 (×2): 81 mg via ORAL
  Filled 2018-01-08 (×2): qty 1

## 2018-01-08 NOTE — Discharge Summary (Signed)
[facility] NAME: Sharon [name]    MR#:  [id number]  DATE OF BIRTH:  1946/09/14  DATE OF ADMISSION:  01/04/2018 ADMITTING PHYSICIAN: Saundra Shelling, MD  DATE OF DISCHARGE: 01/06/2018  5:09 PM  PRIMARY CARE PHYSICIAN: Kathrine Haddock, NP   ADMISSION DIAGNOSIS:  Glossitis [K14.0] TIA (transient ischemic attack) [G45.9]  DISCHARGE DIAGNOSIS:  Active Problems:   TIA (transient ischemic attack)   Cerebrovascular accident (CVA) due to embolism of precerebral artery ([place])   SECONDARY DIAGNOSIS:   Past Medical History:  Diagnosis Date  . Anxiety   . Arthritis    hands, upper back  . Asthma   . COPD (chronic obstructive pulmonary disease) ([place])   . History of cervical cancer   . Menopausal disorder   . Osteoporosis   . Pneumonia 1960  . Spasm of abdominal muscles of right side    intermittent  . TMJ (dislocation of temporomandibular joint)   . Wears dentures    partial lower     ADMITTING HISTORY  HISTORY OF PRESENT ILLNESS: Sharon [name]  is a 72 y.o. female with a known history of COPD, osteoporosis, anxiety disorder presented to the emergency room for slurred speech and weakness in the index finger and thumb of left hand.  Patient noticed elbow symptoms later this morning.  Speech is improved after presentation to the emergency room.  She also says she had a facial droop in the morning but that has resolved after presentation to the emergency room.  Patient has difficulty in balancing and gait which is new.  She was evaluated with CT head in the emergency room which showed no acute intracranial abnormality.  No no history of any witnessed seizure.  No headache.  Had some dizziness.  Hospitalist service was consulted for further care.  HOSPITAL COURSE:   *Acute embolic CVA.  Patient was started on aspirin and Plavix.  Echocardiogram was normal.  This was followed by a TEE which showed no thrombus.  Patient's telemetry monitoring did  not show any arrhythmias in the hospital.  Patient is being referred to outpatient cardiology for a loop recorder. Patient symptoms are much improved.  Worked with physical therapy occupational therapy and speech during the hospital stay. Seen by neurology.  Patient is being discharged home in stable condition with aspirin, Plavix, statin to follow-up with cardiology and neurology.  CONSULTS OBTAINED:  Treatment Team:  Alexis Goodell, MD End, Harrell Gave, MD  DRUG ALLERGIES:   Allergies  Allergen Reactions  . Librium [Chlordiazepoxide] Itching    Dizziness   . Bactrim [Sulfamethoxazole-Trimethoprim] Nausea And Vomiting  . Ibuprofen Rash    Mouth swelling  . Latex Rash    Some bandaids, some gloves  . Naprosyn [Naproxen] Rash    Mouth swelling  . Other Rash    [place] nuts - mouth swelling  . Strawberry Extract Rash    DISCHARGE MEDICATIONS:   Allergies as of 01/06/2018      Reactions   Librium [chlordiazepoxide] Itching   Dizziness   Bactrim [sulfamethoxazole-trimethoprim] Nausea And Vomiting   Ibuprofen Rash   Mouth swelling   Latex Rash   Some bandaids, some gloves   Naprosyn [naproxen] Rash   Mouth swelling   Other Rash   [place] nuts - mouth swelling   Strawberry Extract Rash      Medication List    TAKE these medications   albuterol 108 (90 Base) MCG/ACT inhaler Commonly known as:  PROAIR HFA Inhale 2  puffs into the lungs every 6 (six) hours as needed for wheezing or shortness of breath.   aspirin EC 81 MG tablet Take 81 mg by mouth every other day.   atorvastatin 40 MG tablet Commonly known as:  LIPITOR Take 1 tablet (40 mg total) by mouth daily at 6 PM.   b complex vitamins capsule Take 1 capsule by mouth daily.   B-12 PO Take 1 tablet by mouth daily.   budesonide-formoterol 160-4.5 MCG/ACT inhaler Commonly known as:  SYMBICORT Inhale 2 puffs into the lungs 2 (two) times daily.   clopidogrel 75 MG tablet Commonly known as:  PLAVIX Take 1  tablet (75 mg total) by mouth daily.   Cyanocobalamin 1000 MCG/ML Kit Inject 1,000 mcg as directed every 30 (thirty) days.   estradiol 0.1 mg/24hr patch Commonly known as:  CLIMARA - Dosed in mg/24 hr Place 0.1 mg onto the skin 2 (two) times a week.   guaiFENesin 600 MG 12 hr tablet Commonly known as:  MUCINEX Take 1 tablet (600 mg total) by mouth 2 (two) times daily.   meloxicam 7.5 MG tablet Commonly known as:  MOBIC Take 1 tablet by mouth daily.   multivitamin tablet Take 2 tablets by mouth daily.   omeprazole 40 MG capsule Commonly known as:  PRILOSEC Take 40 mg by mouth daily.   SUPER GARLIC PO Take 2 tablets by mouth daily.   Tiotropium Bromide Monohydrate 2.5 MCG/ACT Aers Commonly known as:  SPIRIVA RESPIMAT Inhale 2 puffs into the lungs daily.   Vitamin D 2000 units tablet Take 2,000 Units by mouth daily.       Today   VITAL SIGNS:  Blood pressure (!) 106/56, pulse 86, temperature 97.7 F (36.5 C), temperature source Oral, resp. rate 16, height 5' 9"  (1.753 m), weight 86.2 kg (190 lb), SpO2 98 %.  I/O:  No intake or output data in the 24 hours ending 01/08/18 1734  PHYSICAL EXAMINATION:  Physical Exam  GENERAL:  72 y.o.-year-old patient lying in the bed with no acute distress.  LUNGS: Normal breath sounds bilaterally, no wheezing, rales,rhonchi or crepitation. No use of accessory muscles of respiration.  CARDIOVASCULAR: S1, S2 normal. No murmurs, rubs, or gallops.  ABDOMEN: Soft, non-tender, non-distended. Bowel sounds present. No organomegaly or mass.  NEUROLOGIC: Moves all 4 extremities. PSYCHIATRIC: The patient is alert and oriented x 3.  SKIN: No obvious rash, lesion, or ulcer.   DATA REVIEW:   CBC Recent Labs  Lab 01/07/18 2030  WBC 9.9  HGB 14.4  HCT 43.6  PLT 229    Chemistries  Recent Labs  Lab 01/07/18 2030  NA 137  K 4.0  CL 103  CO2 25  GLUCOSE 125*  BUN 23*  CREATININE 1.37*  CALCIUM 8.8*  AST 22  ALT 21  ALKPHOS  72  BILITOT 0.5    Cardiac Enzymes Recent Labs  Lab 01/07/18 2030  TROPONINI <0.03    Microbiology Results  Results for orders placed or performed in visit on 10/17/16  Rapid strep screen (not at [facility])     Status: None   Collection Time: 10/17/16  2:21 PM  Result Value Ref Range Status   Strep Gp A Ag, IA W/Reflex Negative Negative Final  Culture, Group A Strep     Status: Abnormal   Collection Time: 10/17/16  2:21 PM  Result Value Ref Range Status   Strep A Culture CANCELED (A)      Comment: Test not performed. Specimen could not be  located. notified Loma Sender  Result canceled by the ancillary     RADIOLOGY:  Ct Head Wo Contrast  Result Date: 01/07/2018 CLINICAL DATA:  Acute presentation with left-sided weakness and numbness. EXAM: CT HEAD WITHOUT CONTRAST TECHNIQUE: Contiguous axial images were obtained from the base of the skull through the vertex without intravenous contrast. COMPARISON:  MRI 2 days ago. FINDINGS: Brain: Appearance of the brain remains normal. No sign of acute infarction, mass lesion, hemorrhage, hydrocephalus or extra-axial collection. Punctate acute infarctions in the right MCA territory shown on the recent MRI are not visible. Vascular: There is atherosclerotic calcification of the major vessels at the base of the brain. Skull: Normal Sinuses/Orbits: Clear/normal Other: None IMPRESSION: Head CT remains normal for age. MRI 2 days ago showed several punctate foci of acute infarction in the right MCA territory, but these are below the resolution of CT. Electronically Signed   By: Nelson Chimes M.D.   On: 01/07/2018 20:27   Mr Brain Wo Contrast  Result Date: 01/07/2018 CLINICAL DATA:  Left facial numbness. Left upper and lower extremity numbness. EXAM: MRI HEAD WITHOUT CONTRAST TECHNIQUE: Multiplanar, multiecho pulse sequences of the brain and surrounding structures were obtained without intravenous contrast. COMPARISON:  Head CT 01/07/2018 and brain MRI  01/05/2018 FINDINGS: BRAIN: Unchanged areas of diffusion restriction in the right frontal lobe. The midline structures are normal. No mass lesion, hydrocephalus, dural abnormality or extra-axial collection. Hyperintense T2-weighted signal at the sites of diffusion restriction. Otherwise normal parenchymal signal of the brain. No age-advanced or lobar predominant atrophy. No chronic microhemorrhage or superficial siderosis. VASCULAR: Major intracranial arterial and venous sinus flow voids are preserved. SKULL AND UPPER CERVICAL SPINE: The visualized skull base, calvarium, upper cervical spine and extracranial soft tissues are normal. SINUSES/ORBITS: No fluid levels or advanced mucosal thickening. No mastoid or middle ear effusion. Normal orbits. IMPRESSION: Unchanged examination compared to 01/05/2018 with persistent foci of subacute ischemia in the right frontal lobe, within the precentral gyrus and paracentral lobule. Electronically Signed   By: Ulyses Jarred M.D.   On: 01/07/2018 22:14    Follow up with PCP in 1 week.  Management plans discussed with the patient, family and they are in agreement.  CODE STATUS:  Code Status History    Date Active Date Inactive Code Status Order ID Comments User Context   01/04/2018 1958 01/06/2018 2015 Full Code [id number]  Saundra Shelling, MD ED      TOTAL TIME TAKING CARE OF THIS PATIENT ON DAY OF DISCHARGE: more than 30 minutes.   Leia Alf Hina Gupta M.D on 01/08/2018 at 5:34 PM  Between 7am to 6pm - Pager - [phone]  After 6pm go to [email or web address] - password [email or web address] [facility]  Office  [phone]  CC: Primary care physician; Kathrine Haddock, NP  Note: This dictation was prepared with Dragon dictation along with smaller phrase technology. Any transcriptional errors that result from this process are unintentional.

## 2018-01-08 NOTE — Progress Notes (Signed)
SLP Cancellation Note  Patient Details Name: TAJ ARTEAGA MRN: [id number] DOB: 01/12/1946   Cancelled treatment:       Reason Eval/Treat Not Completed: SLP screened, no needs identified, will sign off(chart reviewed; NSG consulted then met w/ pt ). Pt denied any difficulty swallowing and is currently on a regular diet; tolerates swallowing pills w/ water per NSG. Pt conversed at conversational level w/out deficits noted; pt talking on the phone w/ her Son upon entering room w/ no deficits noted. Pt stated she had trouble w/ "a word once or twice" - encouraged pt to slow down and take her time during conversation.   No further skilled ST services indicated as pt appears close to/at her baseline. Pt agreed. NSG to reconsult if any change in status.     Orinda Kenner, MS, CCC-SLP Watson,Katherine 01/08/2018, 12:05 PM

## 2018-01-08 NOTE — Evaluation (Signed)
Occupational Therapy Evaluation Patient Details Name: Sharon [name] MRN: [id number] DOB: [birth year] Today's Date: 01/08/2018    History of Present Illness Pt. is a 72 y.o. female who was admitted to [facility] with worsening left sided weakness, and left visual changes. Pt. was hospitalized two days prior to this most recent admission for a CVA with ahpasia. Imaging revealed Punctate Foci of Acute infarction, in the Right MCA Territory., and persistent Foci of sucacute ischemia in the right Frontal Lobe.   Clinical Impression   Pt. Presents with left sided weakness, impaired [place], impaired proprioception, decreased sensation to light touch in the left 2nd digit, and left peripheral visual changes which limit her ability to complete basic ADL and IADL functioning efficiently. Pt. Resides at home with her husband. Pt. Was independent with ADLs, and IADL functioning: including meal preparation, and medication management, driving, tending to her husband who has multiple medical issues/Dialysis and was working on her small farm tending the animals. Pt. Education was provided about LUE [facility] exercises, with a visual handout provided to the pt. Pt. Education was provided about visual compensatory strategies, and scanning to the left within her extrapersonal space, and near space during ADL, and IADL tasks. Pt. Could benefit from OT services for ADL training, A/E training, neuromuscular re-education, visual compensatory strategies, and pt. education about home modification, and DME. Pt. Would benefit from outpatient OT services, however Pt. reports she is unable to have any follow-up therapy at this time, because she has way too much to tend to at home with her husband, and the farm. Pt. Would like exercises she can do at home.    Follow Up Recommendations  No OT follow up    Equipment Recommendations  None recommended by OT    Recommendations for Other Services       Precautions / Restrictions  Precautions Precautions: Fall Restrictions Weight Bearing Restrictions: No      Mobility Bed Mobility Overal bed mobility: Independent             General bed mobility comments: able to easily get to sitting at EOB w/o assist  Transfers Overall transfer level: Independent Equipment used: None             General transfer comment: Pt able to rise and maintain balance w/o issue    Balance Overall balance assessment: Modified Independent;No apparent balance deficits (not formally assessed)                                         ADL either performed or assessed with clinical judgement   ADL Overall ADL's : Needs assistance/impaired Eating/Feeding: Independent   Grooming: Independent(Difficulty opening bottles.)   Upper Body Bathing: Independent   Lower Body Bathing: Independent   Upper Body Dressing : Supervision/safety   Lower Body Dressing: Supervision/safety   Toilet Transfer: Modified Independent   Toileting- Clothing Manipulation and Hygiene: Supervision/safety       Functional mobility during ADLs: Independent General ADL Comments: Pt. has difficulty with left hand Vill[place]ba tasks needed for opening containers, managing buttons, lids.      Vision Baseline Vision/History: Wears glasses Wears Glasses: At all times Patient Visual Report: Blurring of vision;Peripheral vision impairment(Left) Vision Assessment?: Vision impaired- to be further tested in functional context Additional Comments: Pt. is scheduled to have right catarct removed in May.      Perception     Praxis  Pertinent Vitals/Pain Pain Assessment: No/denies pain     Hand Dominance Right   Extremity/Trunk Assessment Upper Extremity Assessment Upper Extremity Assessment: LUE deficits/detail(History of RUE RTC repair, and CTS being followed by [facility] orthopedics. Has recently received 2 injections.) LUE Deficits / Details: LUE shoulder WFL proximally. Impaired  grip strength, pinch strength, coordination, impaired proprioception, and 2nd digit sensation to light touch. LUE Sensation: decreased light touch(to left 2nd digit) LUE Coordination: decreased fine motor     Communication Communication Communication: No difficulties   Cognition Arousal/Alertness: Awake/alert Behavior During Therapy: WFL for tasks assessed/performed Overall Cognitive Status: Within Functional Limits for tasks assessed                                     General Comments       Exercises     Shoulder Instructions      Home Living Family/patient expects to be discharged to:: Private residence Living Arrangements: Spouse/significant other Available Help at Discharge: Family;Available PRN/intermittently Type of Home: House Home Access: Stairs to enter [employer] of Steps: 10 Entrance Stairs-Rails: Right Home Layout: Two level;Laundry or work area in Building surveyor of Steps: 12 Alternate Level Stairs-Rails: Right Bathroom Shower/Tub: Chief Strategy Officer: Toilet riser;Tub bench;Walker - 2 wheels;Cane - single point          Prior Functioning/Environment Level of Independence: Independent        Comments: Pt. was independent with ADLs, IADLs, caregiver for husband with multiple medical issues, and Dialysis, tending a small farm, and driving.        OT Problem List: Decreased strength;Decreased activity tolerance;Decreased range of motion      OT Treatment/Interventions: Therapeutic exercise;Patient/family education;Neuromuscular education    OT Goals(Current goals can be found in the care plan section) Acute Rehab OT Goals Patient Stated Goal: To regain strength in her LUE. Have exercises she can do at home. OT Goal Formulation: With patient Potential to Achieve Goals: Good  OT Frequency: Min 2X/week   Barriers to D/C:            Co-evaluation              AM-PAC  PT "6 Clicks" Daily Activity     Outcome Measure Help from another person eating meals?: None Help from another person taking care of personal grooming?: None Help from another person toileting, which includes using toliet, bedpan, or urinal?: A Little Help from another person bathing (including washing, rinsing, drying)?: None Help from another person to put on and taking off regular upper body clothing?: A Little Help from another person to put on and taking off regular lower body clothing?: A Little 6 Click Score: 21   End of Session    Activity Tolerance: Patient tolerated treatment well Patient left: in bed;with call bell/phone within reach;with family/visitor present  OT Visit Diagnosis: Muscle weakness (generalized) (M62.81);Other abnormalities of gait and mobility (R26.89)                Time: 1100-1130 OT Time Calculation (min): 30 min Charges:  OT General Charges $OT Visit: 1 Visit OT Evaluation $OT Eval Moderate Complexity: 1 Mod G-Codes:     Harrel Carina, MS, OTR/L   Harrel Carina , MS, OTR/L 01/08/2018, 12:31 PM

## 2018-01-08 NOTE — H&P (Signed)
[facility] [place] NAME: Sharon [name]    MR#:  [id number]  DATE OF BIRTH:  1946/04/30  DATE OF ADMISSION:  01/07/2018  PRIMARY CARE PHYSICIAN: Kathrine Haddock, NP   REQUESTING/REFERRING PHYSICIAN:   CHIEF COMPLAINT:   Chief Complaint  Patient presents with  . Cerebrovascular Accident    HISTORY OF PRESENT ILLNESS: Sharon [name]  is a 72 y.o. female with a known history of recent stroke, just 2 days ago, with residual aphasia.  Patient was discharged home on aspirin and Plavix, which she has been compliant with. She presented to the emergency room this time for worsening left upper extremity weakness and left lower extremity heaviness, started suddenly approximately 1 hour before presenting to the emergency room.  Left facial, left upper extremity and left lower extremity tingling and numbness are associated; also blurry vision in the left eye.  Patient admits to being very anxious lately, the past 2 days.  There are no factors that would improve or worsen her symptoms.  No fever or chills, no chest pain, no shortness of breath. Blood test done in the emergency room, including CBC and CMP are essentially unremarkable.  Brain CT and MRI of the brain, reviewed by myself, are noted without any new changes, since 2 days ago.  Patient is admitted for further evaluation and treatment.  PAST MEDICAL HISTORY:   Past Medical History:  Diagnosis Date  . Anxiety   . Arthritis    hands, upper back  . Asthma   . COPD (chronic obstructive pulmonary disease) ([place])   . History of cervical cancer   . Menopausal disorder   . Osteoporosis   . Pneumonia 1960  . Spasm of abdominal muscles of right side    intermittent  . TMJ (dislocation of temporomandibular joint)   . Wears dentures    partial lower    PAST SURGICAL HISTORY:  Past Surgical History:  Procedure Laterality Date  . ABDOMINAL HYSTERECTOMY  1970's  . bladder botox  2005  . BLADDER  SUSPENSION  2004  . COLONOSCOPY WITH PROPOFOL N/A 12/13/2015   Procedure: COLONOSCOPY WITH PROPOFOL;  Surgeon: Lucilla Lame, MD;  Location: [place];  Service: Endoscopy;  Laterality: N/A;  . LOOP RECORDER INSERTION N/A 01/07/2018   Procedure: LOOP RECORDER INSERTION;  Surgeon: Deboraha Sprang, MD;  Location: [place] [facility];  Service: Cardiovascular;  Laterality: N/A;  . POLYPECTOMY N/A 12/13/2015   Procedure: POLYPECTOMY;  Surgeon: Lucilla Lame, MD;  Location: [place];  Service: Endoscopy;  Laterality: N/A;  SIGMOID COLON POLYPS X  5  . SHOULDER ARTHROSCOPY W/ ROTATOR CUFF REPAIR Right 1998  . TEE WITHOUT CARDIOVERSION N/A 01/06/2018   Procedure: TRANSESOPHAGEAL ECHOCARDIOGRAM (TEE);  Surgeon: Minna Merritts, MD;  Location: [facility];  Service: Cardiovascular;  Laterality: N/A;  . TONSILLECTOMY AND ADENOIDECTOMY      SOCIAL HISTORY:  Social History   Tobacco Use  . Smoking status: Former Smoker    Packs/day: 0.60    Years: 56.00    Pack years: 33.60    Types: Cigarettes    Last attempt to quit: 07/14/2016    Years since quitting: 1.4  . Smokeless tobacco: Never Used  . Tobacco comment: has smoked off and on  Substance Use Topics  . Alcohol use: Yes    Alcohol/week: 0.0 oz    Comment: occasionally - special occasions    FAMILY HISTORY:  Family History  Problem Relation Age of  Onset  . Diabetes Mother   . Heart disease Mother   . Stroke Mother   . Stroke Maternal Grandmother     DRUG ALLERGIES:  Allergies  Allergen Reactions  . Librium [Chlordiazepoxide] Itching    Dizziness   . Bactrim [Sulfamethoxazole-Trimethoprim] Nausea And Vomiting  . Ibuprofen Rash    Mouth swelling  . Latex Rash    Some bandaids, some gloves  . Naprosyn [Naproxen] Rash    Mouth swelling  . Other Rash    [place] nuts - mouth swelling  . Strawberry Extract Rash    REVIEW OF SYSTEMS:   CONSTITUTIONAL: No fever, fatigue.  EYES: Positive for left eye blurry vision.   EARS, NOSE, AND THROAT: No tinnitus or ear pain.  RESPIRATORY: No cough, shortness of breath, wheezing or hemoptysis.  CARDIOVASCULAR: No chest pain, orthopnea, edema.  GASTROINTESTINAL: No nausea, vomiting, diarrhea or abdominal pain.  GENITOURINARY: No dysuria, hematuria.  ENDOCRINE: No polyuria, nocturia,  HEMATOLOGY: No anemia, easy bruising or bleeding SKIN: No rash or lesion. MUSCULOSKELETAL: No joint pain or arthritis.   NEUROLOGIC: Positive for left facial, left upper extremity and left lower extremity tingling and numbness.  Positive for left lower extremity weakness.  PSYCHIATRY: Positive for anxiety.   MEDICATIONS AT HOME:  Prior to Admission medications   Medication Sig Start Date End Date Taking? Authorizing Provider  albuterol (PROAIR HFA) 108 (90 Base) MCG/ACT inhaler Inhale 2 puffs into the lungs every 6 (six) hours as needed for wheezing or shortness of breath. 07/10/17  Yes Kathrine Haddock, NP  aspirin EC 81 MG tablet Take 81 mg by mouth every other day.    Yes [provider]  atorvastatin (LIPITOR) 40 MG tablet Take 1 tablet (40 mg total) by mouth daily at 6 PM. 01/06/18  Yes Sudini, Srikar, MD  b complex vitamins capsule Take 1 capsule by mouth daily.   Yes [provider]  budesonide-formoterol (SYMBICORT) 160-4.5 MCG/ACT inhaler Inhale 2 puffs into the lungs 2 (two) times daily.   Yes [provider]  Cholecalciferol (VITAMIN D) 2000 units tablet Take 2,000 Units by mouth daily.   Yes [provider]  clopidogrel (PLAVIX) 75 MG tablet Take 1 tablet (75 mg total) by mouth daily. 01/06/18 02/05/18 Yes Sudini, Alveta Heimlich, MD  Cyanocobalamin (B-12 PO) Take 1 tablet by mouth daily.   Yes [provider]  Cyanocobalamin 1000 MCG/ML KIT Inject 1,000 mcg as directed every 30 (thirty) days.   Yes [provider]  estradiol (CLIMARA - DOSED IN MG/24 HR) 0.1 mg/24hr patch Place 0.1 mg onto the skin 2 (two) times a week.   Yes  [provider]  guaiFENesin (MUCINEX) 600 MG 12 hr tablet Take 1 tablet (600 mg total) by mouth 2 (two) times daily. 11/02/17  Yes Volney American, PA-C  meloxicam (MOBIC) 7.5 MG tablet Take 1 tablet by mouth daily.   Yes [provider]  Multiple Vitamin (MULTIVITAMIN) tablet Take 2 tablets by mouth daily.    Yes [provider]  omeprazole (PRILOSEC) 40 MG capsule Take 40 mg by mouth daily. 03/02/17  Yes [provider]  SUPER GARLIC PO Take 2 tablets by mouth daily.   Yes [provider]  Tiotropium Bromide Monohydrate (SPIRIVA RESPIMAT) 2.5 MCG/ACT AERS Inhale 2 puffs into the lungs daily. Patient not taking: Reported on 01/04/2018 11/25/17   Kathrine Haddock, NP      PHYSICAL EXAMINATION:   VITAL SIGNS: Blood pressure (!) 113/49, pulse 71, temperature 98.3 F (  36.8 C), temperature source Oral, resp. rate 17, weight 86.2 kg (190 lb), SpO2 98 %.  GENERAL:  72 y.o.-year-old patient lying in the bed with no acute distress.  She seems anxious. EYES: Pupils equal, round, reactive to light and accommodation. No scleral icterus. Extraocular muscles intact.  HEENT: Head atraumatic, normocephalic. Oropharynx and nasopharynx clear.  NECK:  Supple, no jugular venous distention. No thyroid enlargement, no tenderness.  LUNGS: Normal breath sounds bilaterally, no wheezing, rales,rhonchi or crepitation. No use of accessory muscles of respiration.  CARDIOVASCULAR: S1, S2 normal. No S3/S4.  ABDOMEN: Soft, nontender, nondistended. Bowel sounds present. No organomegaly or mass.  EXTREMITIES: No pedal edema, cyanosis, or clubbing.  NEUROLOGIC: Speech is clear.  Face is symmetric, tongue is midline.  Positive left pronator drift is noted.  Hand grip is 5/5 on the right and 4/5 on the left.  Left hip flexors 4/5 muscle strength.  There is decreased sensation to light touch in the left upper and left lower extremities, and left face. PSYCHIATRIC: The patient is  alert and oriented x 3.  SKIN: No obvious rash, lesion, or ulcer.   LABORATORY PANEL:   CBC Recent Labs  Lab 01/04/18 1655 01/07/18 2030  WBC 10.0 9.9  HGB 14.4 14.4  HCT 42.4 43.6  PLT 237 229  MCV 91.7 92.0  MCH 31.1 30.4  MCHC 33.9 33.1  RDW 13.6 13.6  LYMPHSABS 1.7 1.9  MONOABS 0.9 1.0*  EOSABS 0.1 0.1  BASOSABS 0.0 0.0   ------------------------------------------------------------------------------------------------------------------  Chemistries  Recent Labs  Lab 01/04/18 1655 01/07/18 2030  NA 136 137  K 4.2 4.0  CL 103 103  CO2 23 25  GLUCOSE 128* 125*  BUN 21* 23*  CREATININE 0.94 1.37*  CALCIUM 9.1 8.8*  AST 27 22  ALT 30 21  ALKPHOS 67 72  BILITOT 0.6 0.5   ------------------------------------------------------------------------------------------------------------------ estimated creatinine clearance is 43.5 mL/min (A) (by C-G formula based on SCr of 1.37 mg/dL (H)). ------------------------------------------------------------------------------------------------------------------ No results for input(s): TSH, T4TOTAL, T3FREE, THYROIDAB in the last 72 hours.  Invalid input(s): FREET3   Coagulation profile Recent Labs  Lab 01/04/18 1655 01/07/18 2030  INR 0.89 0.92   ------------------------------------------------------------------------------------------------------------------- No results for input(s): DDIMER in the last 72 hours. -------------------------------------------------------------------------------------------------------------------  Cardiac Enzymes Recent Labs  Lab 01/04/18 1655 01/07/18 2030  TROPONINI <0.03 <0.03   ------------------------------------------------------------------------------------------------------------------ Invalid input(s): POCBNP  ---------------------------------------------------------------------------------------------------------------  Urinalysis    Component Value Date/Time    APPEARANCEUR Clear 10/17/2016 1421   GLUCOSEU Negative 10/17/2016 1421   BILIRUBINUR Negative 10/17/2016 1421   PROTEINUR Negative 10/17/2016 1421   NITRITE Negative 10/17/2016 1421   LEUKOCYTESUR Negative 10/17/2016 1421     RADIOLOGY: Ct Head Wo Contrast  Result Date: 01/07/2018 CLINICAL DATA:  Acute presentation with left-sided weakness and numbness. EXAM: CT HEAD WITHOUT CONTRAST TECHNIQUE: Contiguous axial images were obtained from the base of the skull through the vertex without intravenous contrast. COMPARISON:  MRI 2 days ago. FINDINGS: Brain: Appearance of the brain remains normal. No sign of acute infarction, mass lesion, hemorrhage, hydrocephalus or extra-axial collection. Punctate acute infarctions in the right MCA territory shown on the recent MRI are not visible. Vascular: There is atherosclerotic calcification of the major vessels at the base of the brain. Skull: Normal Sinuses/Orbits: Clear/normal Other: None IMPRESSION: Head CT remains normal for age. MRI 2 days ago showed several punctate foci of acute infarction in the right MCA territory, but these are below the resolution of CT. Electronically Signed   By: Elta Guadeloupe  Shogry M.D.   On: 01/07/2018 20:27   Mr Brain Wo Contrast  Result Date: 01/07/2018 CLINICAL DATA:  Left facial numbness. Left upper and lower extremity numbness. EXAM: MRI HEAD WITHOUT CONTRAST TECHNIQUE: Multiplanar, multiecho pulse sequences of the brain and surrounding structures were obtained without intravenous contrast. COMPARISON:  Head CT 01/07/2018 and brain MRI 01/05/2018 FINDINGS: BRAIN: Unchanged areas of diffusion restriction in the right frontal lobe. The midline structures are normal. No mass lesion, hydrocephalus, dural abnormality or extra-axial collection. Hyperintense T2-weighted signal at the sites of diffusion restriction. Otherwise normal parenchymal signal of the brain. No age-advanced or lobar predominant atrophy. No chronic microhemorrhage or  superficial siderosis. VASCULAR: Major intracranial arterial and venous sinus flow voids are preserved. SKULL AND UPPER CERVICAL SPINE: The visualized skull base, calvarium, upper cervical spine and extracranial soft tissues are normal. SINUSES/ORBITS: No fluid levels or advanced mucosal thickening. No mastoid or middle ear effusion. Normal orbits. IMPRESSION: Unchanged examination compared to 01/05/2018 with persistent foci of subacute ischemia in the right frontal lobe, within the precentral gyrus and paracentral lobule. Electronically Signed   By: Ulyses Jarred M.D.   On: 01/07/2018 22:14    EKG: Orders placed or performed during the hospital encounter of 01/07/18  . ED EKG  . ED EKG    IMPRESSION AND PLAN:  1.  Left-sided weakness, status recent post acute stroke.  This could be related to expansion of the stroke.  We will have neurology further evaluate the patient.  Continue aspirin and Plavix.  We will start IV fluids to keep the blood pressure on the higher side.  We will stop [place], due to high risk for causing blood clots.  Continue to monitor patient clinically closely.  PT/OT consulted to evaluate and treat the patient. 2.  Acute renal failure, likely prerenal, secondary to dehydration.  We will start gentle IV hydration and monitor kidney function closely.  Avoid nephrotoxic medications. 3.  COPD, stable, restart home maintenance medications.  All the records are reviewed and case discussed with ED provider. Management plans discussed with the patient and she is in agreement.  CODE STATUS:    Code Status Orders  (From admission, onward)        Start     Ordered   01/08/18 0037  Full code  Continuous     01/08/18 0036    Code Status History    Date Active Date Inactive Code Status Order ID Comments User Context   01/04/2018 1958 01/06/2018 2015 Full Code [id number]  Saundra Shelling, MD ED       TOTAL TIME TAKING CARE OF THIS PATIENT: 45 minutes.    Amelia Jo M.D  on 01/08/2018 at 3:13 AM  Between 7am to 6pm - Pager - [phone]  After 6pm go to [email or web address] - password [email or web address] [facility]  Office  [phone]  CC: Primary care physician; Kathrine Haddock, NP

## 2018-01-08 NOTE — Plan of Care (Signed)
  Problem: Education: Goal: Knowledge of General Education information will improve Outcome: Progressing   Problem: Activity: Goal: Risk for activity intolerance will decrease Outcome: Progressing   Problem: Pain Managment: Goal: General experience of comfort will improve Outcome: Progressing   Problem: Safety: Goal: Ability to remain free from injury will improve Outcome: Progressing   Problem: Skin Integrity: Goal: Risk for impaired skin integrity will decrease Outcome: Progressing   Problem: Education: Goal: Knowledge of disease or condition will improve Outcome: Progressing Goal: Knowledge of secondary prevention will improve Outcome: Progressing Goal: Knowledge of patient specific risk factors addressed and post discharge goals established will improve Outcome: Progressing   Problem: Intracerebral Hemorrhage Tissue Perfusion: Goal: Complications of Intracerebral Hemorrhage will be minimized Outcome: Progressing   Problem: Ischemic Stroke/TIA Tissue Perfusion: Goal: Complications of ischemic stroke/TIA will be minimized Outcome: Progressing

## 2018-01-08 NOTE — Progress Notes (Signed)
[facility] NAME: Sharon [name]    MR#:  [id number]  DATE OF BIRTH:  1946/02/15  SUBJECTIVE:  CHIEF COMPLAINT:   Chief Complaint  Patient presents with  . Cerebrovascular Accident   Mild left-sided weakness  REVIEW OF SYSTEMS:    Review of Systems  Constitutional: Negative for chills and fever.  HENT: Negative for sore throat.   Eyes: Negative for blurred vision, double vision and pain.  Respiratory: Negative for cough, hemoptysis, shortness of breath and wheezing.   Cardiovascular: Negative for chest pain, palpitations, orthopnea and leg swelling.  Gastrointestinal: Negative for abdominal pain, constipation, diarrhea, heartburn, nausea and vomiting.  Genitourinary: Negative for dysuria and hematuria.  Musculoskeletal: Negative for back pain and joint pain.  Skin: Negative for rash.  Neurological: Positive for focal weakness. Negative for sensory change, speech change and headaches.  Endo/Heme/Allergies: Does not bruise/bleed easily.  Psychiatric/Behavioral: Negative for depression. The patient is not nervous/anxious.     DRUG ALLERGIES:   Allergies  Allergen Reactions  . Librium [Chlordiazepoxide] Itching    Dizziness   . Bactrim [Sulfamethoxazole-Trimethoprim] Nausea And Vomiting  . Ibuprofen Rash    Mouth swelling  . Latex Rash    Some bandaids, some gloves  . Naprosyn [Naproxen] Rash    Mouth swelling  . Other Rash    [place] nuts - mouth swelling  . Strawberry Extract Rash    VITALS:  Blood pressure (!) 126/54, pulse 74, temperature 98.1 F (36.7 C), temperature source Oral, resp. rate 16, height 5\' 9"  (1.753 m), weight 83.8 kg (184 lb 11.2 oz), SpO2 98 %.  PHYSICAL EXAMINATION:   Physical Exam  GENERAL:  72 y.o.-year-old patient lying in the bed with no acute distress.  EYES: Pupils equal, round, reactive to light and accommodation. No scleral icterus. Extraocular muscles intact.  HEENT: Head atraumatic,  normocephalic. Oropharynx and nasopharynx clear.  NECK:  Supple, no jugular venous distention. No thyroid enlargement, no tenderness.  LUNGS: Normal breath sounds bilaterally, no wheezing, rales, rhonchi. No use of accessory muscles of respiration.  CARDIOVASCULAR: S1, S2 normal. No murmurs, rubs, or gallops.  ABDOMEN: Soft, nontender, nondistended. Bowel sounds present. No organomegaly or mass.  EXTREMITIES: No cyanosis, clubbing or edema b/l.    NEUROLOGIC: Cranial nerves II through XII are intact.  Decreased motor strength on the left side PSYCHIATRIC: The patient is alert and oriented x 3.  SKIN: No obvious rash, lesion, or ulcer.   LABORATORY PANEL:   CBC Recent Labs  Lab 01/07/18 2030  WBC 9.9  HGB 14.4  HCT 43.6  PLT 229   ------------------------------------------------------------------------------------------------------------------ Chemistries  Recent Labs  Lab 01/07/18 2030  NA 137  K 4.0  CL 103  CO2 25  GLUCOSE 125*  BUN 23*  CREATININE 1.37*  CALCIUM 8.8*  AST 22  ALT 21  ALKPHOS 72  BILITOT 0.5   ------------------------------------------------------------------------------------------------------------------  Cardiac Enzymes Recent Labs  Lab 01/07/18 2030  TROPONINI <0.03   ------------------------------------------------------------------------------------------------------------------  RADIOLOGY:  Ct Head Wo Contrast  Result Date: 01/07/2018 CLINICAL DATA:  Acute presentation with left-sided weakness and numbness. EXAM: CT HEAD WITHOUT CONTRAST TECHNIQUE: Contiguous axial images were obtained from the base of the skull through the vertex without intravenous contrast. COMPARISON:  MRI 2 days ago. FINDINGS: Brain: Appearance of the brain remains normal. No sign of acute infarction, mass lesion, hemorrhage, hydrocephalus or extra-axial collection. Punctate acute infarctions in the right MCA territory shown on the recent MRI are not visible.  Vascular: There is atherosclerotic calcification of the major vessels at the base of the brain. Skull: Normal Sinuses/Orbits: Clear/normal Other: None IMPRESSION: Head CT remains normal for age. MRI 2 days ago showed several punctate foci of acute infarction in the right MCA territory, but these are below the resolution of CT. Electronically Signed   By: Nelson Chimes M.D.   On: 01/07/2018 20:27   Mr Brain Wo Contrast  Result Date: 01/07/2018 CLINICAL DATA:  Left facial numbness. Left upper and lower extremity numbness. EXAM: MRI HEAD WITHOUT CONTRAST TECHNIQUE: Multiplanar, multiecho pulse sequences of the brain and surrounding structures were obtained without intravenous contrast. COMPARISON:  Head CT 01/07/2018 and brain MRI 01/05/2018 FINDINGS: BRAIN: Unchanged areas of diffusion restriction in the right frontal lobe. The midline structures are normal. No mass lesion, hydrocephalus, dural abnormality or extra-axial collection. Hyperintense T2-weighted signal at the sites of diffusion restriction. Otherwise normal parenchymal signal of the brain. No age-advanced or lobar predominant atrophy. No chronic microhemorrhage or superficial siderosis. VASCULAR: Major intracranial arterial and venous sinus flow voids are preserved. SKULL AND UPPER CERVICAL SPINE: The visualized skull base, calvarium, upper cervical spine and extracranial soft tissues are normal. SINUSES/ORBITS: No fluid levels or advanced mucosal thickening. No mastoid or middle ear effusion. Normal orbits. IMPRESSION: Unchanged examination compared to 01/05/2018 with persistent foci of subacute ischemia in the right frontal lobe, within the precentral gyrus and paracentral lobule. Electronically Signed   By: Ulyses Jarred M.D.   On: 01/07/2018 22:14     ASSESSMENT AND PLAN:   * Evolving CVA Improved left weakness On ASA and Plavix. No thrombus on TEE Loop recorder to be interrogated today for atrial fibrillation or flutter. Discussed with  Dr. Doy Mince and Dr. Rockey Situ.  *COPD stable.  No wheezing.  Discharge tomorrow if stable symptoms and no atrial fibrillation on LINQ interrogation  All the records are reviewed and case discussed with Care Management/Social Worker Management plans discussed with the patient, family and they are in agreement.  CODE STATUS: FULL CODE  DVT Prophylaxis: SCDs  TOTAL TIME TAKING CARE OF THIS PATIENT: 35 minutes.   POSSIBLE D/C IN 1-2 DAYS, DEPENDING ON CLINICAL CONDITION.  Leia Alf Blaike Newburn M.D on 01/08/2018 at 5:49 PM  Between 7am to 6pm - Pager - [phone]  After 6pm go to [email or web address] - password [email or web address] [facility]  Office  [phone]  CC: Primary care physician; Kathrine Haddock, NP  Note: This dictation was prepared with Dragon dictation along with smaller phrase technology. Any transcriptional errors that result from this process are unintentional.

## 2018-01-08 NOTE — Evaluation (Signed)
Physical Therapy Evaluation Patient Details Name: Sharon [name] MRN: [id number] DOB: [birth year] Today's Date: 01/08/2018   History of Present Illness  72 y/o female here 3 days ago with CVA and L sided weakness.  She was discharged but felt symptoms progressing and returned.  Clinical Impression  Pt did well with PT exam and was able to manage 250 ft of ambulation/stair negotiation and gait training apart from the PT exam.  She did have some L grip strength weakness and finger to nose coordination issues but sensation is WNL for all extremities (still some numbness in L cheek).  Pt overall seems appropriate to return home and she states she is not interested in having HHPT, etc and that staying active with her animals but insuring that she doesn't over do it should be fine for her.  Pt was independent and safe during PT exam and PT agrees that she is safe to return home.  Will keep her on PT caseload for the time that she is here, no needs at d/c.    Follow Up Recommendations (states she stays busy tending the animals, not interested )    Equipment Recommendations       Recommendations for Other Services       Precautions / Restrictions Precautions Precautions: Fall Restrictions Weight Bearing Restrictions: No      Mobility  Bed Mobility Overal bed mobility: Independent             General bed mobility comments: able to easily get to sitting at EOB w/o assist  Transfers Overall transfer level: Independent Equipment used: None             General transfer comment: Pt able to rise and maintain balance w/o issue  Ambulation/Gait Ambulation/Gait assistance: Supervision Ambulation Distance (Feet): 250 Feet Assistive device: None       General Gait Details: Pt did well ambulating w/o AD and did not need more than occasional fleeting UE use on rails for comfort.  She had some fatigue but vitals remained stable and appropriate t/o the effort. No LOBs, no veering, no  overt safety concerns.  She did need some cuing/gait training regarding safety with step length, cadence and balance awareness.  Stairs Stairs: Yes Stairs assistance: Supervision Stair Management: One rail Right Number of Stairs: 6 General stair comments: Pt able to negotiate steps safely and (using rail) relatively confidently.  She reports that she does scoot down on her bottom to the basement most of the time.   Wheelchair Mobility    Modified Rankin (Stroke Patients Only)       Balance Overall balance assessment: Modified Independent;No apparent balance deficits (not formally assessed)                                           Pertinent Vitals/Pain Pain Assessment: No/denies pain    Home Living Family/patient expects to be discharged to:: Private residence Living Arrangements: Spouse/significant other Available Help at Discharge: Family;Available PRN/intermittently Type of Home: House Home Access: Stairs to enter Entrance Stairs-Rails: Right Entrance Stairs-Number of Steps: 10 Home Layout: Two level;Laundry or work area in [employer]: Toilet riser;Tub bench;Walker - 2 wheels;Cane - single point      Prior Function Level of Independence: Independent         Comments: Able to drive, 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  Ambulates at least 300  ft QD to tend to farm. No falls.     Hand Dominance   Dominant Hand: Right    Extremity/Trunk Assessment   Upper Extremity Assessment Upper Extremity Assessment: (R RTC limits elevation, L with decreased grip strength, WFL) LUE Deficits / Details: grossly WFL, L impaired grip strength and coordination with finger to nose, sensation intact    Lower Extremity Assessment Lower Extremity Assessment: Overall WFL for tasks assessed(grossly equal bilaterally)       Communication   Communication: No difficulties  Cognition Arousal/Alertness: Awake/alert Behavior During Therapy: WFL for  tasks assessed/performed Overall Cognitive Status: Within Functional Limits for tasks assessed                                        General Comments      Exercises     Assessment/Plan    PT Assessment Patient needs continued PT services  PT Problem List Decreased strength;Decreased mobility;Decreased activity tolerance;Decreased knowledge of use of DME       PT Treatment Interventions DME instruction;Functional mobility training;Balance training;Patient/family education;Gait training;Therapeutic activities;Neuromuscular re-education;Therapeutic exercise;Stair training    PT Goals (Current goals can be found in the Care Plan section)  Acute Rehab PT Goals Patient Stated Goal: To return home and to her active lifestyle. PT Goal Formulation: With patient Time For Goal Achievement: 01/22/18 Potential to Achieve Goals: Good    Frequency Min 2X/week   Barriers to discharge        Co-evaluation               AM-PAC PT "6 Clicks" Daily Activity  Outcome Measure Difficulty turning over in bed (including adjusting bedclothes, sheets and blankets)?: None Difficulty moving from lying on back to sitting on the side of the bed? : None Difficulty sitting down on and standing up from a chair with arms (e.g., wheelchair, bedside commode, etc,.)?: None Help needed moving to and from a bed to chair (including a wheelchair)?: None Help needed walking in hospital room?: None Help needed climbing 3-5 steps with a railing? : A Little 6 Click Score: 23    End of Session Equipment Utilized During Treatment: Gait belt Activity Tolerance: Patient tolerated treatment well;Patient limited by fatigue Patient left: with chair alarm set;with call bell/phone within reach Nurse Communication: Mobility status PT Visit Diagnosis: Muscle weakness (generalized) (M62.81);Unsteadiness on feet (R26.81)    Time: 3790-2409 PT Time Calculation (min) (ACUTE ONLY): 25  min   Charges:   PT Evaluation $PT Eval Low Complexity: 1 Low PT Treatments $Gait Training: 8-22 mins   PT G Codes:        Kreg Shropshire, DPT 01/08/2018, 10:44 AM

## 2018-01-08 NOTE — Progress Notes (Addendum)
Per [employer] pt had no episode of afib, Dannial Monarch [phone]

## 2018-01-08 NOTE — ED Notes (Signed)
Attempted to call report at this time; unit secretary stated that the "charge nurse needs to look over the pt's chart" and will call back ASAP. This RNs ACSOM # given for return phone call when ready to accept report.

## 2018-01-08 NOTE — Plan of Care (Signed)
Pt report she does not peripheral vision at baseline. Pt NIH = 2. Pt currently does not have any support at home. Pt report husband is sick and takes dialysis 3 days per week. 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  Per pt only 5 days in the last three months she or husband did not have an appointment. Pt son currently [place] of [place]. Pt may benefit from social work consult.

## 2018-01-08 NOTE — Progress Notes (Signed)
    We have contacted [employer] to come out to interrogate the [facility].

## 2018-01-08 NOTE — Progress Notes (Signed)
Subjective: Patient returned to the ED on yesterday with complaints of worsening left sided numbness and weakness.  Symptoms improved today.  Not a tPA or intervention candidate due to recent infarcts.  Remains on ASA and Plavix.  Recent work up including TEE was unremarkable.    Objective: Current vital signs: BP 126/68 (BP Location: Right Arm)   Pulse 68   Temp 98 F (36.7 C) (Oral)   Resp 18   Ht 5\' 9"  (1.753 m)   Wt 83.8 kg (184 lb 11.2 oz)   LMP  (LMP Unknown)   SpO2 97%   BMI 27.28 kg/m  Vital signs in last 24 hours: Temp:  [97.7 F (36.5 C)-98.6 F (37 C)] 98 F (36.7 C) (03/29 7824) Pulse Rate:  [65-93] 68 (03/29 0623) Resp:  [15-18] 18 (03/29 0623) BP: (106-160)/(49-73) 126/68 (03/29 0623) SpO2:  [97 %-100 %] 97 % (03/29 0623) Weight:  [83.8 kg (184 lb 11.2 oz)-86.2 kg (190 lb)] 83.8 kg (184 lb 11.2 oz) (03/29 0036)  Intake/Output from previous day: No intake/output data recorded. Intake/Output this shift: Total I/O In: 620 [P.O.:620] Out: -  Nutritional status: Fall precautions Diet Heart Room service appropriate? Yes; Fluid consistency: Thin  Neurologic Exam: Mental Status: Alert, oriented, thought content appropriate.  Speech fluent without evidence of aphasia.  Able to follow 3 step commands without difficulty. Cranial Nerves: II: Discs flat bilaterally; Visual fields grossly normal, pupils equal, round, reactive to light and accommodation III,IV, VI: ptosis not present, extra-ocular motions intact bilaterally V,VII: smile symmetric, facial light touch sensation decreased on the left VIII: hearing normal bilaterally IX,X: gag reflex present XI: bilateral shoulder shrug XII: midline tongue extension Motor: Right :  Upper extremity   5/5                                      Left:     Upper extremity   5/5             Lower extremity   5/5                                                  Lower extremity   5-/5 Tone and bulk:normal tone throughout; no  atrophy noted Sensory: Pinprick and light touch decreased in the left upper and lower extrermities Deep Tendon Reflexes: 2+ and symmetric with absent AJ's bilaterally Plantars: Right: downgoing                                Left: upgoing Cerebellar: Intact finger to nose and heel to shin testing bilaterally Gait: not tested due to safety concerns    Lab Results: Basic Metabolic Panel: Recent Labs  Lab 01/04/18 1655 01/07/18 2030  NA 136 137  K 4.2 4.0  CL 103 103  CO2 23 25  GLUCOSE 128* 125*  BUN 21* 23*  CREATININE 0.94 1.37*  CALCIUM 9.1 8.8*    Liver Function Tests: Recent Labs  Lab 01/04/18 1655 01/07/18 2030  AST 27 22  ALT 30 21  ALKPHOS 67 72  BILITOT 0.6 0.5  PROT 7.4 7.0  ALBUMIN 4.2 3.8   No results for input(s): LIPASE, AMYLASE in the last 168 hours. No  results for input(s): AMMONIA in the last 168 hours.  CBC: Recent Labs  Lab 01/04/18 1655 01/07/18 2030  WBC 10.0 9.9  NEUTROABS 7.2* 6.9*  HGB 14.4 14.4  HCT 42.4 43.6  MCV 91.7 92.0  PLT 237 229    Cardiac Enzymes: Recent Labs  Lab 01/04/18 1655 01/07/18 2030  TROPONINI <0.03 <0.03    Lipid Panel: Recent Labs  Lab 01/05/18 0629 01/08/18 0406  CHOL 191 158  TRIG 54 45  HDL 55 54  CHOLHDL 3.5 2.9  VLDL 11 9  LDLCALC 125* 95    CBG: Recent Labs  Lab 01/04/18 1702  GLUCAP 114*    Microbiology: Results for orders placed or performed in visit on 10/17/16  Rapid strep screen (not at [facility])     Status: None   Collection Time: 10/17/16  2:21 PM  Result Value Ref Range Status   Strep Gp A Ag, IA W/Reflex Negative Negative Final  Culture, Group A Strep     Status: Abnormal   Collection Time: 10/17/16  2:21 PM  Result Value Ref Range Status   Strep A Culture CANCELED (A)      Comment: Test not performed. Specimen could not be located. notified Loma Sender  Result canceled by the ancillary     Coagulation Studies: Recent Labs    01/07/18 2030  LABPROT 12.3  INR  0.92    Imaging: Ct Head Wo Contrast  Result Date: 01/07/2018 CLINICAL DATA:  Acute presentation with left-sided weakness and numbness. EXAM: CT HEAD WITHOUT CONTRAST TECHNIQUE: Contiguous axial images were obtained from the base of the skull through the vertex without intravenous contrast. COMPARISON:  MRI 2 days ago. FINDINGS: Brain: Appearance of the brain remains normal. No sign of acute infarction, mass lesion, hemorrhage, hydrocephalus or extra-axial collection. Punctate acute infarctions in the right MCA territory shown on the recent MRI are not visible. Vascular: There is atherosclerotic calcification of the major vessels at the base of the brain. Skull: Normal Sinuses/Orbits: Clear/normal Other: None IMPRESSION: Head CT remains normal for age. MRI 2 days ago showed several punctate foci of acute infarction in the right MCA territory, but these are below the resolution of CT. Electronically Signed   By: Nelson Chimes M.D.   On: 01/07/2018 20:27   Mr Brain Wo Contrast  Result Date: 01/07/2018 CLINICAL DATA:  Left facial numbness. Left upper and lower extremity numbness. EXAM: MRI HEAD WITHOUT CONTRAST TECHNIQUE: Multiplanar, multiecho pulse sequences of the brain and surrounding structures were obtained without intravenous contrast. COMPARISON:  Head CT 01/07/2018 and brain MRI 01/05/2018 FINDINGS: BRAIN: Unchanged areas of diffusion restriction in the right frontal lobe. The midline structures are normal. No mass lesion, hydrocephalus, dural abnormality or extra-axial collection. Hyperintense T2-weighted signal at the sites of diffusion restriction. Otherwise normal parenchymal signal of the brain. No age-advanced or lobar predominant atrophy. No chronic microhemorrhage or superficial siderosis. VASCULAR: Major intracranial arterial and venous sinus flow voids are preserved. SKULL AND UPPER CERVICAL SPINE: The visualized skull base, calvarium, upper cervical spine and extracranial soft tissues are  normal. SINUSES/ORBITS: No fluid levels or advanced mucosal thickening. No mastoid or middle ear effusion. Normal orbits. IMPRESSION: Unchanged examination compared to 01/05/2018 with persistent foci of subacute ischemia in the right frontal lobe, within the precentral gyrus and paracentral lobule. Electronically Signed   By: Ulyses Jarred M.D.   On: 01/07/2018 22:14    Medications:  I have reviewed the patient's current medications. Scheduled: . aspirin EC  81 mg  Oral QODAY  . atorvastatin  40 mg Oral q1800  . B-complex with vitamin C  1 tablet Oral Daily  . cholecalciferol  2,000 Units Oral Daily  . clopidogrel  75 mg Oral Daily  . [START ON 02/05/2018] cyanocobalamin  1,000 mcg Intramuscular Q30 days  . guaiFENesin  600 mg Oral BID  . heparin  5,000 Units Subcutaneous Q8H  . mometasone-formoterol  2 puff Inhalation BID  . multivitamin with minerals  2 tablet Oral Daily  . pantoprazole  40 mg Oral Daily  . tiotropium  18 mcg Inhalation Daily  . vitamin B-12  500 mcg Oral Daily    Assessment/Plan: Patient returns with worsening left sided symptoms.  Recent right hemispheric infarcts diagnosed.  Work up including TEE unremarkable.  Would not repeat.  Patient discharged on ASA and Plavix.  Patient has been compliant.  MRI repeated on this admission and shows no new acute infarcts.  Loop monitor in place.  Recommendations: 1.  Therapy to evaluate 2.  Continue ASA and Plavix 3.  Monitor to be interrogated for possible arrhythmias that would dictate change of prophylactic therapy.      LOS: 1 day   Alexis Goodell, MD Neurology [phone] 01/08/2018  11:09 AM

## 2018-01-09 NOTE — Progress Notes (Signed)
Patient discharged with husband. IV removed with catheter in tact, patient verbalized understanding of education. Patient discharged with no complaints.

## 2018-01-09 NOTE — Discharge Summary (Signed)
[facility] NAME: Sharon [name]    MR#:  [id number]  DATE OF BIRTH:  1945/12/18  DATE OF ADMISSION:  01/07/2018 ADMITTING PHYSICIAN: Deboraha Sprang, MD  DATE OF DISCHARGE: 01/09/2018  PRIMARY CARE PHYSICIAN: Kathrine Haddock, NP    ADMISSION DIAGNOSIS:  Glossitis [K14.0] Renal insufficiency [N28.9] Left-sided weakness [R53.1] Left face and left arm tingling [R20.2]  DISCHARGE DIAGNOSIS:  Active Problems: Extension of previous CVA   SECONDARY DIAGNOSIS:   Past Medical History:  Diagnosis Date  . Anxiety   . Arthritis    hands, upper back  . Asthma   . COPD (chronic obstructive pulmonary disease) ([place])   . History of cervical cancer   . Menopausal disorder   . Osteoporosis   . Pneumonia 1960  . Spasm of abdominal muscles of right side    intermittent  . TMJ (dislocation of temporomandibular joint)   . Wears dentures    partial lower    HOSPITAL COURSE:  72 year old female recently discharged with a recent right hemisphere stroke who presents to the emergency room for worsening left upper extremity weakness.  1.  Evolving CVA: Patient did not have a new stroke by MRI.  Patient was evaluated neurology.  She continues to have some numbness and weakness on the left side from residual CVA.  She has a loop recorder implanted.  [employer] did come and interrogate it.  She has no episodes of atrial fibrillation.  She will continue on aspirin, Plavix and statin.  2.  History of COPD without signs of exacerbation   DISCHARGE CONDITIONS AND DIET:   Stable Cardiac diet  CONSULTS OBTAINED:  Treatment Team:  Alexis Goodell, MD  DRUG ALLERGIES:   Allergies  Allergen Reactions  . Librium [Chlordiazepoxide] Itching    Dizziness   . Bactrim [Sulfamethoxazole-Trimethoprim] Nausea And Vomiting  . Ibuprofen Rash    Mouth swelling  . Latex Rash    Some bandaids, some gloves  . Naprosyn [Naproxen] Rash    Mouth swelling   . Other Rash    [place] nuts - mouth swelling  . Strawberry Extract Rash    DISCHARGE MEDICATIONS:   Allergies as of 01/09/2018      Reactions   Librium [chlordiazepoxide] Itching   Dizziness   Bactrim [sulfamethoxazole-trimethoprim] Nausea And Vomiting   Ibuprofen Rash   Mouth swelling   Latex Rash   Some bandaids, some gloves   Naprosyn [naproxen] Rash   Mouth swelling   Other Rash   [place] nuts - mouth swelling   Strawberry Extract Rash      Medication List    TAKE these medications   albuterol 108 (90 Base) MCG/ACT inhaler Commonly known as:  PROAIR HFA Inhale 2 puffs into the lungs every 6 (six) hours as needed for wheezing or shortness of breath.   aspirin EC 81 MG tablet Take 81 mg by mouth every other day.   atorvastatin 40 MG tablet Commonly known as:  LIPITOR Take 1 tablet (40 mg total) by mouth daily at 6 PM.   b complex vitamins capsule Take 1 capsule by mouth daily.   B-12 PO Take 1 tablet by mouth daily.   budesonide-formoterol 160-4.5 MCG/ACT inhaler Commonly known as:  SYMBICORT Inhale 2 puffs into the lungs 2 (two) times daily.   clopidogrel 75 MG tablet Commonly known as:  PLAVIX Take 1 tablet (75 mg total) by mouth daily.   Cyanocobalamin 1000 MCG/ML Kit Inject 1,000 mcg as directed  every 30 (thirty) days.   estradiol 0.1 mg/24hr patch Commonly known as:  CLIMARA - Dosed in mg/24 hr Place 0.1 mg onto the skin 2 (two) times a week.   guaiFENesin 600 MG 12 hr tablet Commonly known as:  MUCINEX Take 1 tablet (600 mg total) by mouth 2 (two) times daily.   meloxicam 7.5 MG tablet Commonly known as:  MOBIC Take 1 tablet by mouth daily.   multivitamin tablet Take 2 tablets by mouth daily.   omeprazole 40 MG capsule Commonly known as:  PRILOSEC Take 40 mg by mouth daily.   SUPER GARLIC PO Take 2 tablets by mouth daily.   Tiotropium Bromide Monohydrate 2.5 MCG/ACT Aers Commonly known as:  SPIRIVA RESPIMAT Inhale 2 puffs into the  lungs daily.   Vitamin D 2000 units tablet Take 2,000 Units by mouth daily.         Today   CHIEF COMPLAINT:  Doing well this am Had a HA from not sleeping well same symptoms no reported new symptoms   VITAL SIGNS:  Blood pressure 118/68, pulse 93, temperature 98.9 F (37.2 C), temperature source Oral, resp. rate 16, height 5' 9"  (1.753 m), weight 83.8 kg (184 lb 11.2 oz), SpO2 98 %.   REVIEW OF SYSTEMS:  Review of Systems  Constitutional: Negative.  Negative for chills, fever and malaise/fatigue.  HENT: Negative.  Negative for ear discharge, ear pain, hearing loss, nosebleeds and sore throat.   Eyes: Negative.  Negative for blurred vision and pain.  Respiratory: Negative.  Negative for cough, hemoptysis, shortness of breath and wheezing.   Cardiovascular: Negative.  Negative for chest pain, palpitations and leg swelling.  Gastrointestinal: Negative.  Negative for abdominal pain, blood in stool, diarrhea, nausea and vomiting.  Genitourinary: Negative.  Negative for dysuria.  Musculoskeletal: Negative.  Negative for back pain.  Skin: Negative.   Neurological: Positive for sensory change and headaches. Negative for dizziness, tremors, speech change, focal weakness and seizures.  Endo/Heme/Allergies: Negative.  Does not bruise/bleed easily.  Psychiatric/Behavioral: Negative.  Negative for depression, hallucinations and suicidal ideas.     PHYSICAL EXAMINATION:  GENERAL:  72 y.o.-year-old patient lying in the bed with no acute distress.  NECK:  Supple, no jugular venous distention. No thyroid enlargement, no tenderness.  LUNGS: Normal breath sounds bilaterally, no wheezing, rales,rhonchi  No use of accessory muscles of respiration.  CARDIOVASCULAR: S1, S2 normal. No murmurs, rubs, or gallops.  ABDOMEN: Soft, non-tender, non-distended. Bowel sounds present. No organomegaly or mass.  EXTREMITIES: No pedal edema, cyanosis, or clubbing.  PSYCHIATRIC: The patient is alert and  oriented x 3.  SKIN: No obvious rash, lesion, or ulcer.  NEURO 5-/5 left UE and decreased sensation Left hand and upper lip DATA REVIEW:   CBC Recent Labs  Lab 01/07/18 2030  WBC 9.9  HGB 14.4  HCT 43.6  PLT 229    Chemistries  Recent Labs  Lab 01/07/18 2030  NA 137  K 4.0  CL 103  CO2 25  GLUCOSE 125*  BUN 23*  CREATININE 1.37*  CALCIUM 8.8*  AST 22  ALT 21  ALKPHOS 72  BILITOT 0.5    Cardiac Enzymes Recent Labs  Lab 01/04/18 1655 01/07/18 2030  TROPONINI <0.03 <0.03    Microbiology Results  @[email or web address] @  RADIOLOGY:  Ct Head Wo Contrast  Result Date: 01/07/2018 CLINICAL DATA:  Acute presentation with left-sided weakness and numbness. EXAM: CT HEAD WITHOUT CONTRAST TECHNIQUE: Contiguous axial images were obtained from the base of the skull  through the vertex without intravenous contrast. COMPARISON:  MRI 2 days ago. FINDINGS: Brain: Appearance of the brain remains normal. No sign of acute infarction, mass lesion, hemorrhage, hydrocephalus or extra-axial collection. Punctate acute infarctions in the right MCA territory shown on the recent MRI are not visible. Vascular: There is atherosclerotic calcification of the major vessels at the base of the brain. Skull: Normal Sinuses/Orbits: Clear/normal Other: None IMPRESSION: Head CT remains normal for age. MRI 2 days ago showed several punctate foci of acute infarction in the right MCA territory, but these are below the resolution of CT. Electronically Signed   By: Nelson Chimes M.D.   On: 01/07/2018 20:27   Mr Brain Wo Contrast  Result Date: 01/07/2018 CLINICAL DATA:  Left facial numbness. Left upper and lower extremity numbness. EXAM: MRI HEAD WITHOUT CONTRAST TECHNIQUE: Multiplanar, multiecho pulse sequences of the brain and surrounding structures were obtained without intravenous contrast. COMPARISON:  Head CT 01/07/2018 and brain MRI 01/05/2018 FINDINGS: BRAIN: Unchanged areas of diffusion restriction in the right  frontal lobe. The midline structures are normal. No mass lesion, hydrocephalus, dural abnormality or extra-axial collection. Hyperintense T2-weighted signal at the sites of diffusion restriction. Otherwise normal parenchymal signal of the brain. No age-advanced or lobar predominant atrophy. No chronic microhemorrhage or superficial siderosis. VASCULAR: Major intracranial arterial and venous sinus flow voids are preserved. SKULL AND UPPER CERVICAL SPINE: The visualized skull base, calvarium, upper cervical spine and extracranial soft tissues are normal. SINUSES/ORBITS: No fluid levels or advanced mucosal thickening. No mastoid or middle ear effusion. Normal orbits. IMPRESSION: Unchanged examination compared to 01/05/2018 with persistent foci of subacute ischemia in the right frontal lobe, within the precentral gyrus and paracentral lobule. Electronically Signed   By: Ulyses Jarred M.D.   On: 01/07/2018 22:14      Allergies as of 01/09/2018      Reactions   Librium [chlordiazepoxide] Itching   Dizziness   Bactrim [sulfamethoxazole-trimethoprim] Nausea And Vomiting   Ibuprofen Rash   Mouth swelling   Latex Rash   Some bandaids, some gloves   Naprosyn [naproxen] Rash   Mouth swelling   Other Rash   [place] nuts - mouth swelling   Strawberry Extract Rash      Medication List    TAKE these medications   albuterol 108 (90 Base) MCG/ACT inhaler Commonly known as:  PROAIR HFA Inhale 2 puffs into the lungs every 6 (six) hours as needed for wheezing or shortness of breath.   aspirin EC 81 MG tablet Take 81 mg by mouth every other day.   atorvastatin 40 MG tablet Commonly known as:  LIPITOR Take 1 tablet (40 mg total) by mouth daily at 6 PM.   b complex vitamins capsule Take 1 capsule by mouth daily.   B-12 PO Take 1 tablet by mouth daily.   budesonide-formoterol 160-4.5 MCG/ACT inhaler Commonly known as:  SYMBICORT Inhale 2 puffs into the lungs 2 (two) times daily.   clopidogrel 75 MG  tablet Commonly known as:  PLAVIX Take 1 tablet (75 mg total) by mouth daily.   Cyanocobalamin 1000 MCG/ML Kit Inject 1,000 mcg as directed every 30 (thirty) days.   estradiol 0.1 mg/24hr patch Commonly known as:  CLIMARA - Dosed in mg/24 hr Place 0.1 mg onto the skin 2 (two) times a week.   guaiFENesin 600 MG 12 hr tablet Commonly known as:  MUCINEX Take 1 tablet (600 mg total) by mouth 2 (two) times daily.   meloxicam 7.5 MG tablet Commonly known  as:  MOBIC Take 1 tablet by mouth daily.   multivitamin tablet Take 2 tablets by mouth daily.   omeprazole 40 MG capsule Commonly known as:  PRILOSEC Take 40 mg by mouth daily.   SUPER GARLIC PO Take 2 tablets by mouth daily.   Tiotropium Bromide Monohydrate 2.5 MCG/ACT Aers Commonly known as:  SPIRIVA RESPIMAT Inhale 2 puffs into the lungs daily.   Vitamin D 2000 units tablet Take 2,000 Units by mouth daily.        Aspirin prescribed at discharge?  Yes High Intensity Statin Prescribed? (Lipitor 40-55m or Crestor 20-4110m: Yes  Management plans discussed with the patient and sh is in agreement. Stable for discharge home  Patient should follow up with pcp  CODE STATUS:     Code Status Orders  (From admission, onward)        Start     Ordered   01/08/18 0037  Full code  Continuous     01/08/18 0036    Code Status History    Date Active Date Inactive Code Status Order ID Comments User Context   01/04/2018 1958 01/06/2018 2015 Full Code [id number] [id number] ED      TOTAL TIME TAKING CARE OF THIS PATIENT: 38 minutes.    Note: This dictation was prepared with Dragon dictation along with smaller phrase technology. Any transcriptional errors that result from this process are unintentional.  Yona Kosek M.D on 01/09/2018 at 8:58 AM  Between 7am to 6pm - Pager - [phone] After 6pm go to [email or web address] - password [email or web address] [facility]  Office  [phone]: Primary care  physician; WiKathrine HaddockNP

## 2018-01-11 ENCOUNTER — Telehealth: Payer: Self-pay | Admitting: Unknown Physician Specialty

## 2018-01-11 DIAGNOSIS — F419 Anxiety disorder, unspecified: Secondary | ICD-10-CM | POA: Diagnosis not present

## 2018-01-11 DIAGNOSIS — Z8541 Personal history of malignant neoplasm of cervix uteri: Secondary | ICD-10-CM | POA: Diagnosis not present

## 2018-01-11 DIAGNOSIS — Z87891 Personal history of nicotine dependence: Secondary | ICD-10-CM | POA: Diagnosis not present

## 2018-01-11 DIAGNOSIS — M1991 Primary osteoarthritis, unspecified site: Secondary | ICD-10-CM | POA: Diagnosis not present

## 2018-01-11 DIAGNOSIS — G5702 Lesion of sciatic nerve, left lower limb: Secondary | ICD-10-CM | POA: Diagnosis not present

## 2018-01-11 DIAGNOSIS — E785 Hyperlipidemia, unspecified: Secondary | ICD-10-CM | POA: Diagnosis not present

## 2018-01-11 DIAGNOSIS — I2510 Atherosclerotic heart disease of native coronary artery without angina pectoris: Secondary | ICD-10-CM | POA: Diagnosis not present

## 2018-01-11 DIAGNOSIS — M6281 Muscle weakness (generalized): Secondary | ICD-10-CM | POA: Diagnosis not present

## 2018-01-11 DIAGNOSIS — Z48812 Encounter for surgical aftercare following surgery on the circulatory system: Secondary | ICD-10-CM | POA: Diagnosis not present

## 2018-01-11 DIAGNOSIS — I69354 Hemiplegia and hemiparesis following cerebral infarction affecting left non-dominant side: Secondary | ICD-10-CM | POA: Diagnosis not present

## 2018-01-11 DIAGNOSIS — K219 Gastro-esophageal reflux disease without esophagitis: Secondary | ICD-10-CM | POA: Diagnosis not present

## 2018-01-11 DIAGNOSIS — M810 Age-related osteoporosis without current pathological fracture: Secondary | ICD-10-CM | POA: Diagnosis not present

## 2018-01-11 DIAGNOSIS — J439 Emphysema, unspecified: Secondary | ICD-10-CM | POA: Diagnosis not present

## 2018-01-11 NOTE — Progress Notes (Signed)
     Date of Service 01/04/2017  [facility] Tele[facility]urology Consult Services  Impression:  RO Acute Ischemic Stroke.   Patient presents with very recent history of acute ischemic infarct, now with exacerbation of baseline deficits. Given the recent history of stroke, patient is not a candidate for TPA thrombolytics.   Presentation is not suggestive of Large Vessel Occlusive Disease. Thrombectomy would not be recommended.  Patient is being admitted for further workup.     Comments:   [facility] contacted: [phone] [facility] at bedside: 1712 NIHSS assessment time: 1715     Discussed with ED MD Please call with questions  Burnett Harry, MD [facility]   -----------------------------------------------------------------------------------------  CC  stroke alert  History of Present Illness   Patient is a pleasant 72 year old woman with history of recent stroke, discharge about two days ago with residual aphasia.  Patient was discharged with dual antithrombotic treatment with aspirin and Plavix. She reported to the emergency department with exacerbation of left upper extremity and left lower extremity weakness. She states that she has had some slurring of her speech as well but again some of this has been her baseline status. Patient also states that some of this appears now to be at her baseline status after her stroke. History is somewhat vague as to there is really new symptoms.  Diagnostic:  Exam: Patient is in no apparent distress. Patient appears as stated age. No obvious acute respiratory or cardiac distress. Patient is well groomed and well-nourished.  1A: Level of Consciousness - Alert; keenly responsive 1B: Ask Month and Age - Both Questions Right 1C: 'Blink Eyes' & 'Squeeze Hands' - Performs Both Tasks 2: Test Horizontal Extraocular Movements - Normal 3: Test Visual Fields - No Visual Loss 4: Test Facial Palsy - Normal symmetry 5A: Test Left  Arm Motor Drift - No Drift for 10 Seconds 5B: Test Right Arm Motor Drift - No Drift for 10 Seconds 6A: Test Left Leg Motor Drift - No Drift for 5 Seconds 6B: Test Right Leg Motor Drift - No Drift for 5 Seconds 7: Test Limb Ataxia - No Ataxia 8: Test Sensatio reduce sensation left upper and lower extremities to pinprick 9: Test Language/Aphasia - Normal; No aphasia 10: Test Dysarthri mild slurring of speech 11: Test Extinction/Inattention - No abnormality  NIHSS 1  Medical Decision Making:  - Extensive number of diagnosis or management options are considered above. - Extensive amount of complex data reviewed. - High risk of complication and/or morbidity or mortality are associated with differential diagnostic considerations above.  - There may be Uncertain outcome and increased probability of prolonged functional impairment or high probability of severe prolonged functional impairment associated with some of these differential diagnosis.  Medical Data Reviewed:  1.Data reviewed include clinical labs, radiology,Medical Tests; 2.Tests results discussed w/performing or interpreting physician; 3.Obtaining/reviewing old medical records;  4.Obtaining case history from another source;  5.Independent review of image, tracing or specimen.    Patient was informed the Neurology Consult would happen via telehealth (remote video) and consented to receiving care in this manner.

## 2018-01-11 NOTE — Telephone Encounter (Signed)
LMOM to return call to schedule follow up for [facility].

## 2018-01-11 NOTE — Telephone Encounter (Signed)
Routing to provider for verbal order. 

## 2018-01-11 NOTE — Telephone Encounter (Signed)
Copied from [place]. Topic: Quick Communication - See Telephone Encounter >> Jan 11, 2018  4:32 PM Percell Belt A wrote: CRM for notification. See Telephone encounter for: 01/11/18. Kathlee Nations with [facility] [phone] Need verbal for skilled nursing and OT Eval

## 2018-01-12 ENCOUNTER — Ambulatory Visit (INDEPENDENT_AMBULATORY_CARE_PROVIDER_SITE_OTHER): Payer: Medicare Other | Admitting: Unknown Physician Specialty

## 2018-01-12 ENCOUNTER — Encounter: Payer: Self-pay | Admitting: Unknown Physician Specialty

## 2018-01-12 DIAGNOSIS — Z95818 Presence of other cardiac implants and grafts: Secondary | ICD-10-CM | POA: Diagnosis not present

## 2018-01-12 DIAGNOSIS — I6310 Cerebral infarction due to embolism of unspecified precerebral artery: Secondary | ICD-10-CM

## 2018-01-12 DIAGNOSIS — R531 Weakness: Secondary | ICD-10-CM | POA: Insufficient documentation

## 2018-01-12 DIAGNOSIS — F419 Anxiety disorder, unspecified: Secondary | ICD-10-CM | POA: Diagnosis not present

## 2018-01-12 DIAGNOSIS — N183 Chronic kidney disease, stage 3 unspecified: Secondary | ICD-10-CM | POA: Insufficient documentation

## 2018-01-12 MED ORDER — CITALOPRAM HYDROBROMIDE 20 MG PO TABS: 20.0000 mg | ORAL_TABLET | Freq: Every day | ORAL | 3 refills | Status: DC

## 2018-01-12 NOTE — Assessment & Plan Note (Addendum)
Persistent left sided weakness.  Refer to PT/OT.  Continue ASA, Plavix, and statin.  Following with Dr. Candis Musa and neurology

## 2018-01-12 NOTE — Progress Notes (Signed)
BP (!) 141/81 (BP Location: Left Arm, Cuff Size: Normal)   Pulse 70   Temp 98.2 F (36.8 C) (Oral)   Ht 5\' 7"  (1.702 m)   Wt 188 lb 12.8 oz (85.6 kg)   LMP  (LMP Unknown)   SpO2 98%   BMI 29.57 kg/m    Subjective:    Patient ID: Sharon [name], female    DOB: 1946-09-26, 72 y.o.   MRN: [id number]  HPI: Sharon [name] is a 72 y.o. female  Chief Complaint  Patient presents with  . Hospitalization Follow-up    pt states she was in the hospital for mini strokes   CVA Chart review:  2 hospital admissions  Admitted 3/25-3/27 For CVA.  Discharged on ASA and Plavix.  Saw Dr. Candis Musa in the hospital.    3/28 - Loop recorder implanted.    Admitted 3/28 and discharged 3/30 for CVA.  Some left sided numbess and weakness. No A-fib noted with implanted loop recorder.  She was placed on aspirin, Plavix, and statin.    Current concerns Loop recorder - Implanted on 3/28 by Dr. Caryl Comes.  Due to see Dr. Candis Musa tomorrow.  She is supposed to go to [place] to have it checked at the end of the month and is concerned as she can't drive there.  She does not like the looks of the wound and wants it checked.    CVA- Generalized weakness but mostly on the left side.  Feels dizzy.    Anxiety - Pt is having a great deal of anxiety, especially when she is alone.  She had been on  Clonazepam in the past as a passenger during travel.   CKD In hospital GFR was 43.  Recheck today   [facility] has seen patient  Relevant past medical, surgical, family and social history reviewed and updated as indicated. Interim medical history since our last visit reviewed. Allergies and medications reviewed and updated.  Review of Systems  Respiratory: Negative.   Gastrointestinal: Negative.     Per HPI unless specifically indicated above     Objective:    BP (!) 141/81 (BP Location: Left Arm, Cuff Size: Normal)   Pulse 70   Temp 98.2 F (36.8 C) (Oral)   Ht 5\' 7"  (1.702 m)   Wt 188 lb 12.8 oz (85.6 kg)   LMP   (LMP Unknown)   SpO2 98%   BMI 29.57 kg/m   Wt Readings from Last 3 Encounters:  01/12/18 188 lb 12.8 oz (85.6 kg)  01/08/18 184 lb 11.2 oz (83.8 kg)  01/07/18 190 lb (86.2 kg)    Physical Exam  Constitutional: She is oriented to person, place, and time. She appears well-developed and well-nourished. No distress.  HENT:  Head: Normocephalic and atraumatic.  Eyes: Conjunctivae and lids are normal. Right eye exhibits no discharge. Left eye exhibits no discharge. No scleral icterus.  Neck: Normal range of motion. Neck supple. No JVD present. Carotid bruit is not present.  Cardiovascular: Normal rate, regular rhythm and normal heart sounds.  Pulmonary/Chest: Effort normal and breath sounds normal.  Abdominal: Normal appearance. There is no splenomegaly or hepatomegaly.  Musculoskeletal: Normal range of motion.  Neurological: She is alert and oriented to person, place, and time. No cranial nerve deficit or sensory deficit.  1 plus strength left side UE.  Normal in right.    Skin: Skin is warm, dry and intact. No rash noted. No pallor.  Psychiatric: She has a normal mood and affect.  Her behavior is normal. Judgment and thought content normal.    Results for orders placed or performed during the hospital encounter of 01/07/18  Protime-INR  Result Value Ref Range   Prothrombin Time 12.3 11.4 - 15.2 seconds   INR 0.92   APTT  Result Value Ref Range   aPTT 26 24 - 36 seconds  CBC  Result Value Ref Range   WBC 9.9 3.6 - 11.0 K/uL   RBC 4.73 3.80 - 5.20 MIL/uL   Hemoglobin 14.4 12.0 - 16.0 g/dL   HCT 43.6 35.0 - 47.0 %   MCV 92.0 80.0 - 100.0 fL   MCH 30.4 26.0 - 34.0 pg   MCHC 33.1 32.0 - 36.0 g/dL   RDW 13.6 11.5 - 14.5 %   Platelets 229 150 - 440 K/uL  Differential  Result Value Ref Range   Neutrophils Relative % 70 %   Neutro Abs 6.9 (H) 1.4 - 6.5 K/uL   Lymphocytes Relative 19 %   Lymphs Abs 1.9 1.0 - 3.6 K/uL   Monocytes Relative 10 %   Monocytes Absolute 1.0 (H) 0.2 -  0.9 K/uL   Eosinophils Relative 1 %   Eosinophils Absolute 0.1 0 - 0.7 K/uL   Basophils Relative 0 %   Basophils Absolute 0.0 0 - 0.1 K/uL  Comprehensive metabolic panel  Result Value Ref Range   Sodium 137 135 - 145 mmol/L   Potassium 4.0 3.5 - 5.1 mmol/L   Chloride 103 101 - 111 mmol/L   CO2 25 22 - 32 mmol/L   Glucose, Bld 125 (H) 65 - 99 mg/dL   BUN 23 (H) 6 - 20 mg/dL   Creatinine, Ser 1.37 (H) 0.44 - 1.00 mg/dL   Calcium 8.8 (L) 8.9 - 10.3 mg/dL   Total Protein 7.0 6.5 - 8.1 g/dL   Albumin 3.8 3.5 - 5.0 g/dL   AST 22 15 - 41 U/L   ALT 21 14 - 54 U/L   Alkaline Phosphatase 72 38 - 126 U/L   Total Bilirubin 0.5 0.3 - 1.2 mg/dL   GFR calc non Af Amer 38 (L) >60 mL/min   GFR calc Af Amer 43 (L) >60 mL/min   Anion gap 9 5 - 15  Troponin I  Result Value Ref Range   Troponin I <0.03 <0.03 ng/mL  Hemoglobin A1c  Result Value Ref Range   Hgb A1c MFr Bld 5.7 (H) 4.8 - 5.6 %   Mean Plasma Glucose 116.89 mg/dL  Lipid panel  Result Value Ref Range   Cholesterol 158 0 - 200 mg/dL   Triglycerides 45 <150 mg/dL   HDL 54 >40 mg/dL   Total CHOL/HDL Ratio 2.9 RATIO   VLDL 9 0 - 40 mg/dL   LDL Cholesterol 95 0 - 99 mg/dL      Assessment & Plan:   Problem List Items Addressed This Visit      Unprioritized   Anxiety    Start Citalopram 20 mg.  To help with anxiety.        Relevant Medications   citalopram (CELEXA) 20 MG tablet   Cerebrovascular accident (CVA) due to embolism of precerebral artery (HCC)    Persistent left sided weakness.  Refer to PT/OT.  Continue ASA, Plavix, and statin.  Following with Dr. Candis Musa and neurology      Relevant Orders   Ambulatory referral to [place] kidney disease, stage 3 [facility])   Relevant Orders   Comprehensive metabolic panel  Left-sided weakness    Refer to home health.  She can't drive at this time      Relevant Orders   Ambulatory referral to [facility] placement of implantable loop recorder    Wound  clean and dry without inflamation.  Steri-strips in place.  Refer to home nursing to monitor.  Discuss with Dr. Candis Musa for alternative location for assessment by those whom implanted recorder.  Unable to drive to [facility]          Follow up plan: Return in about 1 month (around 02/09/2018).

## 2018-01-12 NOTE — Telephone Encounter (Signed)
OK.  Also put in order when saw her today

## 2018-01-12 NOTE — Assessment & Plan Note (Signed)
Refer to home health.  She can't drive at this time

## 2018-01-12 NOTE — Telephone Encounter (Signed)
Called and let Kathlee Nations know that [facility] gave the OK for orders.

## 2018-01-12 NOTE — Assessment & Plan Note (Signed)
Wound clean and dry without inflamation.  Steri-strips in place.  Refer to home nursing to monitor.  Discuss with Dr. Candis Musa for alternative location for assessment by those whom implanted recorder.  Unable to drive to [facility]

## 2018-01-12 NOTE — Progress Notes (Signed)
Cardiology Office Note  Date:  01/13/2018   ID:  Sharon, [name] [birth year], MRN [id number]  PCP:  Kathrine Haddock, NP   Chief Complaint  Patient presents with  . OTHER    1 wk f/u loop recorder c/o soreness and discuss kidney's . Meds reviewed verbally with pt.    HPI:  Sharon [name] is a 72 y.o. female with a hx of  asthma/COPD,  cervical cancer,  Anxiety embolic stroke  January 04, 2018 Transesophageal echo documenting no left atrial appendage or thrombus Placement of reveal/loop device January 07, 2018 Who presents to establish care in the [facility] office  emergency department January 04, 2018  slurred speech and weakness involving the left hand.  facial droop in the morning, though this had resolved by the time that she presented to the emergency department.  difficulty with balance.    multiple right cerebral infarcts concerning for embolic phenomenon.    In follow-up today she reports having 75% left leg strength Still with residual left hand weakness If she moves her head quickly she has vertigo type symptoms  Recovered well from transesophageal echo and reveal device placement  EKG personally reviewed by myself on todays visit Shows normal sinus rhythm with rate 71 bpm no significant ST or T wave changes  Device is a Medtronic reveal LNQ 11  Sn [id number]   PMH:   has a past medical history of Anxiety, Arthritis, Asthma, COPD (chronic obstructive pulmonary disease) ([place]), History of cervical cancer, Menopausal disorder, Osteoporosis, Pneumonia (1960), Spasm of abdominal muscles of right side, TMJ (dislocation of temporomandibular joint), and Wears dentures.  PSH:    Past Surgical History:  Procedure Laterality Date  . ABDOMINAL HYSTERECTOMY  1970's  . bladder botox  2005  . BLADDER SUSPENSION  2004  . COLONOSCOPY WITH PROPOFOL N/A 12/13/2015   Procedure: COLONOSCOPY WITH PROPOFOL;  Surgeon: Lucilla Lame, MD;  Location: [place];  Service: Endoscopy;   Laterality: N/A;  . LOOP RECORDER INSERTION N/A 01/07/2018   Procedure: LOOP RECORDER INSERTION;  Surgeon: Deboraha Sprang, MD;  Location: [place] [facility];  Service: Cardiovascular;  Laterality: N/A;  . POLYPECTOMY N/A 12/13/2015   Procedure: POLYPECTOMY;  Surgeon: Lucilla Lame, MD;  Location: [place];  Service: Endoscopy;  Laterality: N/A;  SIGMOID COLON POLYPS X  5  . SHOULDER ARTHROSCOPY W/ ROTATOR CUFF REPAIR Right 1998  . TEE WITHOUT CARDIOVERSION N/A 01/06/2018   Procedure: TRANSESOPHAGEAL ECHOCARDIOGRAM (TEE);  Surgeon: Minna Merritts, MD;  Location: [facility];  Service: Cardiovascular;  Laterality: N/A;  . TONSILLECTOMY AND ADENOIDECTOMY      Current Outpatient Medications  Medication Sig Dispense Refill  . albuterol (PROAIR HFA) 108 (90 Base) MCG/ACT inhaler Inhale 2 puffs into the lungs every 6 (six) hours as needed for wheezing or shortness of breath. 8.5 Inhaler 12  . aspirin EC 81 MG tablet Take 81 mg by mouth every other day.     Marland Kitchen atorvastatin (LIPITOR) 40 MG tablet Take 1 tablet (40 mg total) by mouth daily at 6 PM. 30 tablet 0  . b complex vitamins capsule Take 1 capsule by mouth daily.    . budesonide-formoterol (SYMBICORT) 160-4.5 MCG/ACT inhaler Inhale 2 puffs into the lungs 2 (two) times daily.    . Cholecalciferol (VITAMIN D) 2000 units tablet Take 2,000 Units by mouth daily.    . citalopram (CELEXA) 20 MG tablet Take 1 tablet (20 mg total) by mouth daily. 30 tablet 3  . clopidogrel (  PLAVIX) 75 MG tablet Take 1 tablet (75 mg total) by mouth daily. 30 tablet 0  . Coenzyme Q10 (CO Q 10 PO) Take 1 tablet by mouth daily.    . Cyanocobalamin (B-12 PO) Take 1 tablet by mouth daily.    . Cyanocobalamin 1000 MCG/ML KIT Inject 1,000 mcg as directed every 30 (thirty) days.    Marland Kitchen estradiol (CLIMARA - DOSED IN MG/24 HR) 0.1 mg/24hr patch Place 0.1 mg onto the skin 2 (two) times a week.    Marland Kitchen guaiFENesin (MUCINEX) 600 MG 12 hr tablet Take 1 tablet (600 mg total) by mouth  2 (two) times daily. 30 tablet 0  . meloxicam (MOBIC) 7.5 MG tablet Take 1 tablet by mouth daily.    . Multiple Vitamin (MULTIVITAMIN) tablet Take 2 tablets by mouth daily.     Marland Kitchen omeprazole (PRILOSEC) 40 MG capsule Take 40 mg by mouth daily.    . SUPER GARLIC PO Take 2 tablets by mouth daily.    . Tiotropium Bromide Monohydrate (SPIRIVA RESPIMAT) 2.5 MCG/ACT AERS Inhale 2 puffs into the lungs daily. 1 Inhaler 12   Current Facility-Administered Medications  Medication Dose Route Frequency Provider Last Rate Last Dose  . cyanocobalamin ((VITAMIN B-12)) injection 1,000 mcg  1,000 mcg Intramuscular Q30 days Kathrine Haddock, NP   1,000 mcg at 12/15/17 1410     Allergies:   Librium [chlordiazepoxide]; Bactrim [sulfamethoxazole-trimethoprim]; Ibuprofen; Latex; Naprosyn [naproxen]; Other; and Strawberry extract   Social History:  The patient  reports that she quit smoking about 18 months ago. Her smoking use included cigarettes. She has a 33.60 pack-year smoking history. She has never used smokeless tobacco. She reports that she drinks alcohol. She reports that she does not use drugs.   Family History:   family history includes Diabetes in her mother; Heart disease in her mother; Stroke in her maternal grandmother and mother.    Review of Systems: Review of Systems  Constitutional: Negative.   Respiratory: Negative.   Cardiovascular: Negative.   Gastrointestinal: Negative.   Musculoskeletal: Negative.        Left leg weakness, weakness of left hand  Neurological: Negative.   Psychiatric/Behavioral: Negative.   All other systems reviewed and are negative.    PHYSICAL EXAM: VS:  BP 120/60 (BP Location: Left Arm, Patient Position: Sitting, Cuff Size: Normal)   Pulse 71   Ht 5' 9"  (1.753 m)   Wt 188 lb 8 oz (85.5 kg)   LMP  (LMP Unknown)   BMI 27.84 kg/m  , BMI Body mass index is 27.84 kg/m. GEN: Well nourished, well developed, in no acute distress  HEENT: normal  Neck: no JVD,  carotid bruits, or masses Cardiac: RRR; no murmurs, rubs, or gallops,no edema  Device site evaluated, left chest , Steri-Strips still in place, no erythema, no discharge, no swelling or significant tenderness Respiratory:  clear to auscultation bilaterally, normal work of breathing GI: soft, nontender, nondistended, + BS MS: no deformity or atrophy  Skin: warm and dry, no rash Neuro:   sensation are grossly intact, full neurological exam not performed Psych: euthymic mood, full affect   Recent Labs: 01/07/2018: ALT 21; BUN 23; Creatinine, Ser 1.37; Hemoglobin 14.4; Platelets 229; Potassium 4.0; Sodium 137    Lipid Panel Lab Results  Component Value Date   CHOL 158 01/08/2018   HDL 54 01/08/2018   LDLCALC 95 01/08/2018   TRIG 45 01/08/2018      Wt Readings from Last 3 Encounters:  01/13/18 188 lb 8  oz (85.5 kg)  01/12/18 188 lb 12.8 oz (85.6 kg)  01/08/18 184 lb 11.2 oz (83.8 kg)     ASSESSMENT AND PLAN:  Cerebrovascular accident (CVA) due to embolism of precerebral artery ([place]) - Plan: EKG 12-Lead Concern for recent embolic stroke TEE did not show left atrial appendage or left atrial thrombus, reveal device was placed  Wound check today, Steri-Strips in place, no signs of wound infection  Chronic obstructive pulmonary disease, unspecified COPD type ([place]) Long history of smoking, quit 2 years ago Breathing stable  Chronic kidney disease, stage 3 ([place]) Most recent creatinine 1.37 in the setting of prerenal state Repeat lab work arranged by primary care  Anxiety Managed by primary care Provided reassurance on today's visit  Status post placement of implantable loop recorder - Plan: EKG 12-Lead Difficulty getting to [facility] secondary to her stroke and husband is in dialysis several days per week.  We will try to have this device interrogated in [place] if possible  Aortic atherosclerosis Seen on transesophageal echo Stressed importance of aggressive cholesterol  management LDL less than 70, aspirin Plavix per neurology  Disposition:   F/U  6 months   Total encounter time more than 25 minutes  Greater than 50% was spent in counseling and coordination of care with the patient    Orders Placed This Encounter  Procedures  . EKG 12-Lead     Signed, Esmond Plants, M.D., Ph.D. 01/13/2018  [place], [place]

## 2018-01-12 NOTE — Assessment & Plan Note (Signed)
Start Citalopram 20 mg.  To help with anxiety.

## 2018-01-13 ENCOUNTER — Encounter (INDEPENDENT_AMBULATORY_CARE_PROVIDER_SITE_OTHER): Payer: Self-pay

## 2018-01-13 ENCOUNTER — Ambulatory Visit (INDEPENDENT_AMBULATORY_CARE_PROVIDER_SITE_OTHER): Payer: Medicare Other | Admitting: Cardiovascular Disease

## 2018-01-13 ENCOUNTER — Encounter: Payer: Self-pay | Admitting: Cardiovascular Disease

## 2018-01-13 VITALS — BP 120/60 | HR 71 | Ht 69.0 in | Wt 188.5 lb

## 2018-01-13 DIAGNOSIS — Z95818 Presence of other cardiac implants and grafts: Secondary | ICD-10-CM | POA: Diagnosis not present

## 2018-01-13 DIAGNOSIS — N183 Chronic kidney disease, stage 3 unspecified: Secondary | ICD-10-CM

## 2018-01-13 DIAGNOSIS — I700 Atherosclerosis of aorta: Secondary | ICD-10-CM | POA: Diagnosis not present

## 2018-01-13 DIAGNOSIS — I6310 Cerebral infarction due to embolism of unspecified precerebral artery: Secondary | ICD-10-CM | POA: Diagnosis not present

## 2018-01-13 DIAGNOSIS — F419 Anxiety disorder, unspecified: Secondary | ICD-10-CM | POA: Diagnosis not present

## 2018-01-13 DIAGNOSIS — J449 Chronic obstructive pulmonary disease, unspecified: Secondary | ICD-10-CM | POA: Diagnosis not present

## 2018-01-13 NOTE — Telephone Encounter (Signed)
Spoke w/ pt and informed her that we currently do not offer wound check appt at the [place] office. Informed her that she will need to come to [facility] for just this one appt. Pt verbalized understanding and explained that she has transportation issues. She agreed to an appt on 01-21-18 at 4:30 PM but may have to reschedule to a later day due to transportation issues.

## 2018-01-13 NOTE — Patient Instructions (Addendum)

## 2018-01-14 ENCOUNTER — Ambulatory Visit (INDEPENDENT_AMBULATORY_CARE_PROVIDER_SITE_OTHER): Payer: Medicare Other

## 2018-01-14 VITALS — BP 138/78 | HR 60 | Temp 97.8°F | Resp 16 | Ht 68.0 in | Wt 190.2 lb

## 2018-01-14 DIAGNOSIS — Z0000 Encounter for general adult medical examination without abnormal findings: Secondary | ICD-10-CM

## 2018-01-14 DIAGNOSIS — N183 Chronic kidney disease, stage 3 unspecified: Secondary | ICD-10-CM

## 2018-01-14 DIAGNOSIS — K140 Glossitis: Secondary | ICD-10-CM

## 2018-01-14 NOTE — Progress Notes (Signed)
Subjective:   Sharon [name] is a 72 y.o. female who presents for Medicare Annual (Subsequent) preventive examination.  Review of Systems:   Cardiac Risk Factors include: hypertension;advanced age (>37mn, >>21women);smoking/ tobacco exposure     Objective:     Vitals: BP 138/78 (BP Location: Left Arm, Patient Position: Sitting)   Pulse 60   Temp 97.8 F (36.6 C) (Temporal)   Resp 16   Ht 5' 8"  (1.727 m)   Wt 190 lb 3.2 oz (86.3 kg)   LMP  (LMP Unknown)   BMI 28.92 kg/m   Body mass index is 28.92 kg/m.  Advanced Directives 01/14/2018 01/08/2018 01/07/2018 01/04/2018 10/17/2016 12/13/2015 10/17/2015  Does Patient Have a Medical Advance Directive? No No No No No No No  Does patient want to make changes to medical advance directive? Yes (MAU/Ambulatory/Procedural Areas - Information given) - - - - - -  Would patient like information on creating a medical advance directive? - No - Patient declined No - Patient declined No - Patient declined - No - patient declined information No - patient declined information    Tobacco Social History   Tobacco Use  Smoking Status Former Smoker  . Packs/day: 0.60  . Years: 56.00  . Pack years: 33.60  . Types: Cigarettes  . Last attempt to quit: 07/14/2016  . Years since quitting: 1.5  Smokeless Tobacco Never Used  Tobacco Comment   has smoked off and on     Counseling given: Not Answered Comment: has smoked off and on   Clinical Intake:  Pre-visit preparation completed: Yes  Pain : No/denies pain     Nutritional Status: BMI 25 -29 Overweight Nutritional Risks: None Diabetes: No  How often do you need to have someone help you when you read instructions, pamphlets, or other written materials from your doctor or pharmacy?: 1 - Never What is the last grade level you completed in school?: some college   Interpreter Needed?: No  Information entered by :: Tiffany Hill,LPN   Past Medical History:  Diagnosis Date  . Anxiety   .  Arthritis    hands, upper back  . Asthma   . COPD [place]   . History of cervical cancer   . Menopausal disorder   . Osteoporosis   . Pneumonia 1960  . Spasm of abdominal muscles of right side    intermittent  . TMJ (dislocation of temporomandibular joint)   . Wears dentures    partial lower   Past Surgical History:  Procedure Laterality Date  . ABDOMINAL HYSTERECTOMY  1970's  . bladder botox  2005  . BLADDER SUSPENSION  2004  . COLONOSCOPY WITH PROPOFOL N/A 12/13/2015   Procedure: COLONOSCOPY WITH PROPOFOL;  Surgeon: DLucilla Lame MD;  [place]  Service: Endoscopy;  Laterality: N/A;  . LOOP RECORDER INSERTION N/A 01/07/2018   Procedure: LOOP RECORDER INSERTION;  Surgeon: KDeboraha Sprang MD;  [facility];  Service: Cardiovascular;  Laterality: N/A;  . POLYPECTOMY N/A 12/13/2015   Procedure: POLYPECTOMY;  Surgeon: DLucilla Lame MD;  [place]  Service: Endoscopy;  Laterality: N/A;  SIGMOID COLON POLYPS X  5  . SHOULDER ARTHROSCOPY W/ ROTATOR CUFF REPAIR Right 1998  . TEE WITHOUT CARDIOVERSION N/A 01/06/2018   Procedure: TRANSESOPHAGEAL ECHOCARDIOGRAM (TEE);  Surgeon: GMinna Merritts MD;  Location: [facility];  Service: Cardiovascular;  Laterality: N/A;  . TONSILLECTOMY AND ADENOIDECTOMY     Family History  Problem Relation Age of Onset  . Diabetes Mother   . Heart disease Mother   . Stroke Mother   . Stroke Maternal Grandmother    Social History   Socioeconomic History  . Marital status: Married    Spouse name: Not on file  . Number of children: Not on file  . Years of education: Not on file  . Highest education level: Not on file  Occupational History  . Occupation: retired  Scientific laboratory technician  . Financial resource strain: Not hard at all  . Food insecurity:    Worry: Never true    Inability: Never true  . Transportation needs:    Medical: No    Non-medical: No  Tobacco Use    . Smoking status: Former Smoker    Packs/day: 0.60    Years: 56.00    Pack years: 33.60    Types: Cigarettes    Last attempt to quit: 07/14/2016    Years since quitting: 1.5  . Smokeless tobacco: Never Used  . Tobacco comment: has smoked off and on  Substance and Sexual Activity  . Alcohol use: Yes    Alcohol/week: 0.0 oz    Comment: occasionally - special occasions  . Drug use: No  . Sexual activity: Not Currently    Birth control/protection: Post-menopausal  Lifestyle  . Physical activity:    Days per week: 7 days    Minutes per session: 10 min  . Stress: Very much  Relationships  . Social connections:    Talks on phone: More than three times a week    Gets together: Once a week    Attends religious service: 1 to 4 times per year    Active member of club or organization: No    Attends meetings of clubs or organizations: Never    Relationship status: Married  Other Topics Concern  . 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 albuterol (PROAIR HFA) 108 (90 Base) MCG/ACT inhaler Inhale 2 puffs into the lungs every 6 (six) hours as needed for wheezing or shortness of breath.  Marland Kitchen aspirin EC 81 MG tablet Take 81 mg by mouth every other day.   Marland Kitchen atorvastatin (LIPITOR) 40 MG tablet Take 1 tablet (40 mg total) by mouth daily at 6 PM.  . b complex vitamins capsule Take 1 capsule by mouth daily.  . budesonide-formoterol (SYMBICORT) 160-4.5 MCG/ACT inhaler Inhale 2 puffs into the lungs 2 (two) times daily.  . Cholecalciferol (VITAMIN D) 2000 units tablet Take 2,000 Units by mouth daily.  . clopidogrel (PLAVIX) 75 MG tablet Take 1 tablet (75 mg total) by mouth daily.  . Coenzyme Q10 (CO Q 10 PO) Take 1 tablet by mouth daily.  . Cyanocobalamin (B-12 PO) Take 1 tablet by mouth daily.  . Cyanocobalamin 1000 MCG/ML KIT Inject 1,000 mcg as directed every 30 (thirty) days.  Marland Kitchen estradiol  (CLIMARA - DOSED IN MG/24 HR) 0.1 mg/24hr patch Place 0.1 mg onto the skin 2 (two) times a week.  Marland Kitchen guaiFENesin (MUCINEX) 600 MG 12 hr tablet Take 1 tablet (600 mg total) by mouth 2 (two) times daily.  . meloxicam (MOBIC) 7.5 MG tablet Take 1 tablet by mouth daily.  . Multiple Vitamin (MULTIVITAMIN) tablet Take 2 tablets by mouth daily.   Marland Kitchen omeprazole (PRILOSEC) 40 MG capsule Take 40 mg by mouth daily.  . SUPER GARLIC PO Take 2  tablets by mouth daily.  . Tiotropium Bromide Monohydrate (SPIRIVA RESPIMAT) 2.5 MCG/ACT AERS Inhale 2 puffs into the lungs daily.  . citalopram (CELEXA) 20 MG tablet Take 1 tablet (20 mg total) by mouth daily. (Patient not taking: Reported on 01/14/2018)   Facility-Administered Encounter Medications as of 01/14/2018  Medication  . cyanocobalamin ((VITAMIN B-12)) injection 1,000 mcg    Activities of Daily Living In your present state of health, do you have any difficulty performing the following activities: 01/14/2018 01/08/2018  Hearing? Y N  Vision? Y N  Difficulty concentrating or making decisions? N N  Walking or climbing stairs? Y N  Dressing or bathing? N Y  Doing errands, shopping? N N  Preparing Food and eating ? N -  Using the Toilet? N -  In the past six months, have you accidently leaked urine? N -  Do you have problems with loss of bowel control? N -  Managing your Medications? N -  Managing your Finances? N -  Housekeeping or managing your Housekeeping? N -  Some recent data might be hidden    Patient Care Team: Kathrine Haddock, NP as PCP - General (Nurse Practitioner) Bary Castilla, Forest Gleason, MD as Consulting Physician (General Surgery) Kathrine Haddock, NP as Nurse Practitioner (Nurse Practitioner) Minna Merritts, MD as Consulting Physician (Cardiology)    Assessment:   This is a routine wellness examination for [facility].  Exercise Activities and Dietary recommendations Current Exercise Habits: Home exercise routine, Type of exercise: stretching, Time  (Minutes): 10, Frequency (Times/Week): 7, Weekly Exercise (Minutes/Week): 70, Intensity: Mild  Goals    . DIET - INCREASE WATER INTAKE     Recommend continue drinking at least 6-8 glasses of water a day        Fall Risk Fall Risk  01/14/2018 03/13/2017 10/17/2016 10/17/2015  Falls in the past year? No No No No   Is the patient's home free of loose throw rugs in walkways, pet beds, electrical cords, etc?   yes      Grab bars in the bathroom? no      Handrails on the stairs?   yes      Adequate lighting?   yes  Timed Get Up and Go performed: Completed in 8 seconds with no use of assistive devices, steady gait. No intervention needed at this time.   Depression Screen PHQ 2/9 Scores 01/14/2018 11/25/2017 03/13/2017 10/17/2016  PHQ - 2 Score 2 1 1 1   PHQ- 9 Score 4 2 - -     Cognitive Function     6CIT Screen 01/14/2018  What Year? 0 points  What month? 0 points  What time? 0 points  Count back from 20 0 points  Months in reverse 0 points  Repeat phrase 0 points  Total Score 0    Immunization History  Administered Date(s) Administered  . Influenza-Unspecified 08/09/2015, 06/18/2016, 07/17/2017  . Pneumococcal Conjugate-13 09/25/2014  . Pneumococcal-Unspecified 04/07/2012  . Td 09/25/2014    Qualifies for Shingles Vaccine?yes, discussed shingrix vaccine   Screening Tests Health Maintenance  Topic Date Due  . INFLUENZA VACCINE  05/13/2018  . MAMMOGRAM  06/24/2019  . COLONOSCOPY  12/12/2020  . TETANUS/TDAP  09/25/2024  . DEXA SCAN  Completed  . Hepatitis C Screening  Completed  . PNA vac Low Risk Adult  Completed    Cancer Screenings: Lung: Low Dose CT Chest recommended if Age 67-80 years, 30 pack-year currently smoking OR have quit w/in 15years. Patient does qualify. Due in June  Breast:  Up to date on Mammogram? Yes   Up to date of Bone Density/Dexa? Yes Colorectal: completed 12/13/2015  Additional Screenings: : Hepatitis C Screening: completed 10/17/2015     Plan:    I  have personally reviewed and addressed the Medicare Annual Wellness questionnaire and have noted the following in the patient's chart:  A. Medical and social history B. Use of alcohol, tobacco or illicit drugs  C. Current medications and supplements D. Functional ability and status E.  Nutritional status F.  Physical activity G. Advance directives H. List of other physicians I.  Hospitalizations, surgeries, and ER visits in previous 12 months J.  [place] such as hearing and vision if needed, cognitive and depression L. Referrals and appointments   In addition, I have reviewed and discussed with patient certain preventive protocols, quality metrics, and best practice recommendations. A written personalized care plan for preventive services as well as general preventive health recommendations were provided to patient.   Signed,  Tyler Aas, LPN Nurse Health Advisor   Nurse Notes:none

## 2018-01-14 NOTE — Patient Instructions (Signed)
Sharon [name] , Thank you for taking time to come for your Medicare Wellness Visit. I appreciate your ongoing commitment to your health goals. Please review the following plan we discussed and let me know if I can assist you in the future.   Screening recommendations/referrals: Colonoscopy: completed 12/13/2015 Mammogram: completed 06/23/2017 Bone Density: completed 08/05/2017 Recommended yearly ophthalmology/optometry visit for glaucoma screening and checkup Recommended yearly dental visit for hygiene and checkup  Vaccinations: Influenza vaccine: up to date Pneumococcal vaccine: up to date Tdap vaccine: up to date Shingles vaccine: eligible, check with your insurance company for coverage     Advanced directives: Advance directive discussed with you today. I have provided a copy for you to complete at home and have notarized. Once this is complete please bring a copy in to our office so we can scan it into your chart.  Conditions/risks identified: Recommend continue drinking at least 6-8 glasses of water a day   Next appointment: Follow up in one year for your annual wellness exam.    Preventive Care 65 Years and Older, Female Preventive care refers to lifestyle choices and visits with your health care provider that can promote health and wellness. What does preventive care include?  A yearly physical exam. This is also called an annual well check.  Dental exams once or twice a year.  Routine eye exams. Ask your health care provider how often you should have your eyes checked.  Personal lifestyle choices, including:  Daily care of your teeth and gums.  Regular physical activity.  Eating a healthy diet.  Avoiding tobacco and drug use.  Limiting alcohol use.  Practicing safe sex.  Taking low-dose aspirin every day.  Taking vitamin and mineral supplements as recommended by your health care provider. What happens during an annual well check? The services and screenings done  by your health care provider during your annual well check will depend on your age, overall health, lifestyle risk factors, and family history of disease. Counseling  Your health care provider may ask you questions about your:  Alcohol use.  Tobacco use.  Drug use.  Emotional well-being.  Home and relationship well-being.  Sexual activity.  Eating habits.  History of falls.  Memory and ability to understand (cognition).  Work and work Statistician.  Reproductive health. Screening  You may have the following tests or measurements:  Height, weight, and BMI.  Blood pressure.  Lipid and cholesterol levels. These may be checked every 5 years, or more frequently if you are over 33 years old.  Skin check.  Lung cancer screening. You may have this screening every year starting at age 58 if you have a 30-pack-year history of smoking and currently smoke or have quit within the past 15 years.  Fecal occult blood test (FOBT) of the stool. You may have this test every year starting at age 24.  Flexible sigmoidoscopy or colonoscopy. You may have a sigmoidoscopy every 5 years or a colonoscopy every 10 years starting at age 12.  Hepatitis C blood test.  Hepatitis B blood test.  Sexually transmitted disease (STD) testing.  Diabetes screening. This is done by checking your blood sugar (glucose) after you have not eaten for a while (fasting). You may have this done every 1-3 years.  Bone density scan. This is done to screen for osteoporosis. You may have this done starting at age 68.  Mammogram. This may be done every 1-2 years. Talk to your health care provider about how often you should have  regular mammograms. Talk with your health care provider about your test results, treatment options, and if necessary, the need for more tests. Vaccines  Your health care provider may recommend certain vaccines, such as:  Influenza vaccine. This is recommended every year.  Tetanus,  diphtheria, and acellular pertussis (Tdap, Td) vaccine. You may need a Td booster every 10 years.  Zoster vaccine. You may need this after age 85.  Pneumococcal 13-valent conjugate (PCV13) vaccine. One dose is recommended after age 39.  Pneumococcal polysaccharide (PPSV23) vaccine. One dose is recommended after age 59. Talk to your health care provider about which screenings and vaccines you need and how often you need them. This information is not intended to replace advice given to you by your health care provider. Make sure you discuss any questions you have with your health care provider. Document Released: 10/26/2015 Document Revised: 06/18/2016 Document Reviewed: 07/31/2015 Elsevier Interactive Patient Education  2017 [place] Prevention in the Home Falls can cause injuries. They can happen to people of all ages. There are many things you can do to make your home safe and to help prevent falls. What can I do on the outside of my home?  Regularly fix the edges of walkways and driveways and fix any cracks.  Remove anything that might make you trip as you walk through a door, such as a raised step or threshold.  Trim any bushes or trees on the path to your home.  Use bright outdoor lighting.  Clear any walking paths of anything that might make someone trip, such as rocks or tools.  Regularly check to see if handrails are loose or broken. Make sure that both sides of any steps have handrails.  Any raised decks and porches should have guardrails on the edges.  Have any leaves, snow, or ice cleared regularly.  Use sand or salt on walking paths during winter.  Clean up any spills in your garage right away. This includes oil or grease spills. What can I do in the bathroom?  Use night lights.  Install grab bars by the toilet and in the tub and shower. Do not use towel bars as grab bars.  Use non-skid mats or decals in the tub or shower.  If you need to sit down in  the shower, use a plastic, non-slip stool.  Keep the floor dry. Clean up any water that spills on the floor as soon as it happens.  Remove soap buildup in the tub or shower regularly.  Attach bath mats securely with double-sided non-slip rug tape.  Do not have throw rugs and other things on the floor that can make you trip. What can I do in the bedroom?  Use night lights.  Make sure that you have a light by your bed that is easy to reach.  Do not use any sheets or blankets that are too big for your bed. They should not hang down onto the floor.  Have a firm chair that has side arms. You can use this for support while you get dressed.  Do not have throw rugs and other things on the floor that can make you trip. What can I do in the kitchen?  Clean up any spills right away.  Avoid walking on wet floors.  Keep items that you use a lot in easy-to-reach places.  If you need to reach something above you, use a strong step stool that has a grab bar.  Keep electrical cords out of the  way.  Do not use floor polish or wax that makes floors slippery. If you must use wax, use non-skid floor wax.  Do not have throw rugs and other things on the floor that can make you trip. What can I do with my stairs?  Do not leave any items on the stairs.  Make sure that there are handrails on both sides of the stairs and use them. Fix handrails that are broken or loose. Make sure that handrails are as long as the stairways.  Check any carpeting to make sure that it is firmly attached to the stairs. Fix any carpet that is loose or worn.  Avoid having throw rugs at the top or bottom of the stairs. If you do have throw rugs, attach them to the floor with carpet tape.  Make sure that you have a light switch at the top of the stairs and the bottom of the stairs. If you do not have them, ask someone to add them for you. What else can I do to help prevent falls?  Wear shoes that:  Do not have high  heels.  Have rubber bottoms.  Are comfortable and fit you well.  Are closed at the toe. Do not wear sandals.  If you use a stepladder:  Make sure that it is fully opened. Do not climb a closed stepladder.  Make sure that both sides of the stepladder are locked into place.  Ask someone to hold it for you, if possible.  Clearly mark and make sure that you can see:  Any grab bars or handrails.  First and last steps.  Where the edge of each step is.  Use tools that help you move around (mobility aids) if they are needed. These include:  Canes.  Walkers.  Scooters.  Crutches.  Turn on the lights when you go into a dark area. Replace any light bulbs as soon as they burn out.  Set up your furniture so you have a clear path. Avoid moving your furniture around.  If any of your floors are uneven, fix them.  If there are any pets around you, be aware of where they are.  Review your medicines with your doctor. Some medicines can make you feel dizzy. This can increase your chance of falling. Ask your doctor what other things that you can do to help prevent falls. This information is not intended to replace advice given to you by your health care provider. Make sure you discuss any questions you have with your health care provider. Document Released: 07/26/2009 Document Revised: 03/06/2016 Document Reviewed: 11/03/2014 Elsevier Interactive Patient Education  2017 [employer].

## 2018-01-15 ENCOUNTER — Telehealth: Payer: Self-pay | Admitting: Cardiovascular Disease

## 2018-01-15 DIAGNOSIS — M1991 Primary osteoarthritis, unspecified site: Secondary | ICD-10-CM | POA: Diagnosis not present

## 2018-01-15 DIAGNOSIS — J439 Emphysema, unspecified: Secondary | ICD-10-CM | POA: Diagnosis not present

## 2018-01-15 DIAGNOSIS — Z48812 Encounter for surgical aftercare following surgery on the circulatory system: Secondary | ICD-10-CM | POA: Diagnosis not present

## 2018-01-15 DIAGNOSIS — I69354 Hemiplegia and hemiparesis following cerebral infarction affecting left non-dominant side: Secondary | ICD-10-CM | POA: Diagnosis not present

## 2018-01-15 DIAGNOSIS — I2510 Atherosclerotic heart disease of native coronary artery without angina pectoris: Secondary | ICD-10-CM | POA: Diagnosis not present

## 2018-01-15 DIAGNOSIS — G5702 Lesion of sciatic nerve, left lower limb: Secondary | ICD-10-CM | POA: Diagnosis not present

## 2018-01-15 NOTE — Telephone Encounter (Signed)
Spoke with patient and explained the purpose of her appointment and the importance of seeing a device clinic nurse. I additionally explained that device clinic nurses are currently only located at the [place] office and we do not have the ability to change this. She expressed her frustration with the lack of communication regarding this and explained that her and her husband have both been in poor health and that there is no family in the area. I apologized but reinforced that we could not offer any wound check appointments in [place] at this time. [place] address given. Patient verbalized understanding.

## 2018-01-15 NOTE — Telephone Encounter (Signed)
Patient calling back to check on moving wound check to [place] .  Nira Conn was going to check on moving this to  as patient does not have transportation to [place].  Please call to let patient know if this can be done .

## 2018-01-18 ENCOUNTER — Telehealth: Payer: Self-pay | Admitting: *Deleted

## 2018-01-18 DIAGNOSIS — I69354 Hemiplegia and hemiparesis following cerebral infarction affecting left non-dominant side: Secondary | ICD-10-CM | POA: Diagnosis not present

## 2018-01-18 DIAGNOSIS — I2510 Atherosclerotic heart disease of native coronary artery without angina pectoris: Secondary | ICD-10-CM | POA: Diagnosis not present

## 2018-01-18 DIAGNOSIS — Z48812 Encounter for surgical aftercare following surgery on the circulatory system: Secondary | ICD-10-CM | POA: Diagnosis not present

## 2018-01-18 DIAGNOSIS — G5702 Lesion of sciatic nerve, left lower limb: Secondary | ICD-10-CM | POA: Diagnosis not present

## 2018-01-18 DIAGNOSIS — M1991 Primary osteoarthritis, unspecified site: Secondary | ICD-10-CM | POA: Diagnosis not present

## 2018-01-18 DIAGNOSIS — J439 Emphysema, unspecified: Secondary | ICD-10-CM | POA: Diagnosis not present

## 2018-01-18 NOTE — Telephone Encounter (Deleted)
-----   Message from Dejha King Filbert, RN sent at 01/18/2018  9:46 AM EDT ----- Sure, Just let me know when you want her to come back. I looked at her site when she was here seeing Dr. Rockey Situ on Thursday and her steri strips were all still in tact.  ----- Message ----- From: Mechele Dawley, RN Sent: 01/15/2018   5:22 PM To: Dilpreet Faires Filbert, RN  Hi!   Ms. Beedle's home monitor is up to date through today and it has not transmitted any arrhythmias thus far. I saw that Cyril Mourning talked to her and said that we could not accommodate a Device Clinic wound check in [place], but maybe she could have a nurse visit there to remove the steri-strips and do a site assessment? Her LINQ doesn't technically have to be interrogated since it is up to date on nightly transmissions and the appointment itself is not billed. I can call her and go over the education portion if you think we could make this work.    Thanks for your help! Raquel Sarna ----- Message ----- From: Kariya Lavergne Filbert, RN Sent: 01/13/2018   5:08 PM To: [address] [facility]  Patient with a ILR implant at [facility] on 01/07/18. She saw Dr. Rockey Situ for follow up today- she is refusing to come to [facility] for a wound check appointment due to transportation issues.  Dr. Rockey Situ did look at her site today. I saw it as well. Steri strips in tact with no oozing at the site. Minimal bruising inferior to the implant site around the left breast area.  I told Dr. Rockey Situ to document this in his office note.   Should I just set her up to see Dr. Caryl Comes back or have a rep check her when he's back here in the office.   Just let me know- Thanks!

## 2018-01-18 NOTE — Telephone Encounter (Signed)
LMOVM requesting call back to the [facility].  Will offer nurse visit in [place] for wound check and provide LINQ education over the phone if patient is unable to travel to [place].  Home monitor is up to date as of today and has not recorded any episodes as of yet.

## 2018-01-19 ENCOUNTER — Telehealth: Payer: Self-pay | Admitting: Unknown Physician Specialty

## 2018-01-19 DIAGNOSIS — I69354 Hemiplegia and hemiparesis following cerebral infarction affecting left non-dominant side: Secondary | ICD-10-CM | POA: Diagnosis not present

## 2018-01-19 DIAGNOSIS — I2510 Atherosclerotic heart disease of native coronary artery without angina pectoris: Secondary | ICD-10-CM | POA: Diagnosis not present

## 2018-01-19 DIAGNOSIS — M1991 Primary osteoarthritis, unspecified site: Secondary | ICD-10-CM | POA: Diagnosis not present

## 2018-01-19 DIAGNOSIS — Z48812 Encounter for surgical aftercare following surgery on the circulatory system: Secondary | ICD-10-CM | POA: Diagnosis not present

## 2018-01-19 DIAGNOSIS — J439 Emphysema, unspecified: Secondary | ICD-10-CM | POA: Diagnosis not present

## 2018-01-19 DIAGNOSIS — G5702 Lesion of sciatic nerve, left lower limb: Secondary | ICD-10-CM | POA: Diagnosis not present

## 2018-01-19 NOTE — Telephone Encounter (Signed)
Routing to provider for verbal orders 

## 2018-01-19 NOTE — Telephone Encounter (Signed)
Copied from [place] [id number]. Topic: Inquiry >> Jan 19, 2018  8:20 AM Conception Chancy, NT wrote: Reason for CRM: Cecille Rubin is a PT calling from [facility] and states she needs verbal orders to go out and access the patient this week. She missed the visit on 01/15/18. Please contact  [phone]

## 2018-01-19 NOTE — Telephone Encounter (Signed)
Called and let Cecille Rubin know that [place] gave the OK for verbal orders.

## 2018-01-19 NOTE — Telephone Encounter (Signed)
Verbal orders OK?

## 2018-01-19 NOTE — Telephone Encounter (Signed)
Called and left Cecille Rubin a VM asking for her to please return my call. OK for PEC to speak to Cecille Rubin and let her know that Malachy Mood gave the OK for verbal orders if she calls back.

## 2018-01-20 ENCOUNTER — Other Ambulatory Visit: Payer: Self-pay

## 2018-01-20 MED ORDER — OMEPRAZOLE 40 MG PO CPDR: 40.0000 mg | DELAYED_RELEASE_CAPSULE | Freq: Every day | ORAL | 1 refills | Status: DC

## 2018-01-20 NOTE — Telephone Encounter (Signed)
Patient called and requested a refill on omeprazole.

## 2018-01-21 ENCOUNTER — Ambulatory Visit (INDEPENDENT_AMBULATORY_CARE_PROVIDER_SITE_OTHER): Payer: Self-pay | Admitting: *Deleted

## 2018-01-21 DIAGNOSIS — I6310 Cerebral infarction due to embolism of unspecified precerebral artery: Secondary | ICD-10-CM

## 2018-01-21 LAB — CUP PACEART INCLINIC DEVICE CHECK
Implantable Pulse Generator Implant Date: 20190328
MDC IDC SESS DTM: 20190411160151

## 2018-01-21 NOTE — Progress Notes (Signed)
Loop wound check in clinic. Steri-strips removed, incision edges approximated. No redness, swelling or drainage noted. Battery status: good. R-waves 0.83mV. 0 symptom episodes, 0 tachy episodes, 0 pause episodes, 0 brady episodes. 0 AF episodes. Patient educated about Carelink monitoring. Monthly summary reports and ROV with SK/B in 1 yr.

## 2018-01-22 DIAGNOSIS — J439 Emphysema, unspecified: Secondary | ICD-10-CM | POA: Diagnosis not present

## 2018-01-22 DIAGNOSIS — G5702 Lesion of sciatic nerve, left lower limb: Secondary | ICD-10-CM | POA: Diagnosis not present

## 2018-01-22 DIAGNOSIS — Z48812 Encounter for surgical aftercare following surgery on the circulatory system: Secondary | ICD-10-CM | POA: Diagnosis not present

## 2018-01-22 DIAGNOSIS — M1991 Primary osteoarthritis, unspecified site: Secondary | ICD-10-CM | POA: Diagnosis not present

## 2018-01-22 DIAGNOSIS — I2510 Atherosclerotic heart disease of native coronary artery without angina pectoris: Secondary | ICD-10-CM | POA: Diagnosis not present

## 2018-01-22 DIAGNOSIS — I69354 Hemiplegia and hemiparesis following cerebral infarction affecting left non-dominant side: Secondary | ICD-10-CM | POA: Diagnosis not present

## 2018-01-26 DIAGNOSIS — J439 Emphysema, unspecified: Secondary | ICD-10-CM | POA: Diagnosis not present

## 2018-01-26 DIAGNOSIS — I2510 Atherosclerotic heart disease of native coronary artery without angina pectoris: Secondary | ICD-10-CM | POA: Diagnosis not present

## 2018-01-26 DIAGNOSIS — G5702 Lesion of sciatic nerve, left lower limb: Secondary | ICD-10-CM | POA: Diagnosis not present

## 2018-01-26 DIAGNOSIS — Z48812 Encounter for surgical aftercare following surgery on the circulatory system: Secondary | ICD-10-CM | POA: Diagnosis not present

## 2018-01-26 DIAGNOSIS — I69354 Hemiplegia and hemiparesis following cerebral infarction affecting left non-dominant side: Secondary | ICD-10-CM | POA: Diagnosis not present

## 2018-01-26 DIAGNOSIS — M1991 Primary osteoarthritis, unspecified site: Secondary | ICD-10-CM | POA: Diagnosis not present

## 2018-01-28 ENCOUNTER — Telehealth: Payer: Self-pay | Admitting: Unknown Physician Specialty

## 2018-01-28 ENCOUNTER — Other Ambulatory Visit: Payer: Self-pay | Admitting: Unknown Physician Specialty

## 2018-01-28 DIAGNOSIS — G5702 Lesion of sciatic nerve, left lower limb: Secondary | ICD-10-CM | POA: Diagnosis not present

## 2018-01-28 DIAGNOSIS — J439 Emphysema, unspecified: Secondary | ICD-10-CM | POA: Diagnosis not present

## 2018-01-28 DIAGNOSIS — M1991 Primary osteoarthritis, unspecified site: Secondary | ICD-10-CM | POA: Diagnosis not present

## 2018-01-28 DIAGNOSIS — I69354 Hemiplegia and hemiparesis following cerebral infarction affecting left non-dominant side: Secondary | ICD-10-CM | POA: Diagnosis not present

## 2018-01-28 DIAGNOSIS — I2510 Atherosclerotic heart disease of native coronary artery without angina pectoris: Secondary | ICD-10-CM | POA: Diagnosis not present

## 2018-01-28 DIAGNOSIS — Z48812 Encounter for surgical aftercare following surgery on the circulatory system: Secondary | ICD-10-CM | POA: Diagnosis not present

## 2018-01-28 NOTE — Telephone Encounter (Signed)
Copied from [place] [id number]. Topic: General - Other >> Jan 28, 2018  3:39 PM Sharon [name] wrote: Reason for CRM: pt has sustained a new skin tear R posterior forearm yesterday, 3-4 inches up from the wrist, not bleeding, it is covered, it bled some yesterday per report from pt

## 2018-01-28 NOTE — Telephone Encounter (Signed)
Estradiol 0.1 mg patch refill Last OV: 08/14/17 Provider switched from patches to pills Last Refill:"historical provider" Refill refused

## 2018-01-29 NOTE — Telephone Encounter (Signed)
Tell her to cover with vaseline, non-stick bandage, and gauze wrap

## 2018-01-29 NOTE — Telephone Encounter (Signed)
Routing to provider, FYI.  

## 2018-01-29 NOTE — Telephone Encounter (Signed)
Called and left patient a VM letting her know what Malachy Mood said. Asked for patient to call back with any questions or concerns.

## 2018-02-01 ENCOUNTER — Ambulatory Visit (INDEPENDENT_AMBULATORY_CARE_PROVIDER_SITE_OTHER): Payer: Medicare Other | Admitting: Unknown Physician Specialty

## 2018-02-01 ENCOUNTER — Other Ambulatory Visit: Payer: Self-pay | Admitting: Unknown Physician Specialty

## 2018-02-01 ENCOUNTER — Encounter: Payer: Self-pay | Admitting: Unknown Physician Specialty

## 2018-02-01 DIAGNOSIS — S40021A Contusion of right upper arm, initial encounter: Secondary | ICD-10-CM | POA: Diagnosis not present

## 2018-02-01 DIAGNOSIS — I6310 Cerebral infarction due to embolism of unspecified precerebral artery: Secondary | ICD-10-CM | POA: Diagnosis not present

## 2018-02-01 DIAGNOSIS — M62838 Other muscle spasm: Secondary | ICD-10-CM | POA: Diagnosis not present

## 2018-02-01 MED ORDER — BACLOFEN 10 MG PO TABS: 10.0000 mg | ORAL_TABLET | Freq: Three times a day (TID) | ORAL | 0 refills | Status: DC

## 2018-02-01 NOTE — Progress Notes (Signed)
BP 133/74   Pulse 83   Temp 98.4 F (36.9 C) (Oral)   Ht 5\' 9"  (1.753 m)   Wt 192 lb 4.8 oz (87.2 kg)   LMP  (LMP Unknown)   SpO2 97%   BMI 28.40 kg/m    Subjective:    Patient ID: Sharon [name], female    DOB: 06-Nov-1945, 73 y.o.   MRN: [id number]  HPI: Sharon [name] is a 72 y.o. female  Chief Complaint  Patient presents with  . Bleeding/Bruising    pt states her right arm has been cramping since Friday, states a large bruise came up where the cramps was   Pt with complaints of right arm brusing the day after a severe cramp in her lower arm.  States the cramping travels up her arm whenever she tries to do anything.  She is scared to death of getting a blood clot.  No change in temperature.  Denies numbness or tingling.  She does have a wrist tendonitis.  No change  Relevant past medical, surgical, family and social history reviewed and updated as indicated. Interim medical history since our last visit reviewed. Allergies and medications reviewed and updated.  Review of Systems  Per HPI unless specifically indicated above     Objective:    BP 133/74   Pulse 83   Temp 98.4 F (36.9 C) (Oral)   Ht 5\' 9"  (1.753 m)   Wt 192 lb 4.8 oz (87.2 kg)   LMP  (LMP Unknown)   SpO2 97%   BMI 28.40 kg/m   Wt Readings from Last 3 Encounters:  02/01/18 192 lb 4.8 oz (87.2 kg)  01/14/18 190 lb 3.2 oz (86.3 kg)  01/13/18 188 lb 8 oz (85.5 kg)    Physical Exam  Constitutional: She is oriented to person, place, and time. She appears well-developed and well-nourished. No distress.  HENT:  Head: Normocephalic and atraumatic.  Eyes: Conjunctivae and lids are normal. Right eye exhibits no discharge. Left eye exhibits no discharge. No scleral icterus.  Neck: Normal range of motion. Neck supple. No JVD present. Carotid bruit is not present.  Cardiovascular: Normal rate, regular rhythm and normal heart sounds.  Pulmonary/Chest: Effort normal and breath sounds normal.  Abdominal: Normal  appearance. There is no splenomegaly or hepatomegaly.  Musculoskeletal: Normal range of motion.  Bilateral arms with equal BP, temperature, and cap refill  Neurological: She is alert and oriented to person, place, and time.  Skin: Skin is warm, dry and intact. No rash noted. No pallor.  Bruised right dorsal aspect lower arm.    Psychiatric: She has a normal mood and affect. Her behavior is normal. Judgment and thought content normal.    Results for orders placed or performed in visit on 01/21/18  [facility] [place]  Result Value Ref Range   Pulse Generator Manufacturer MERM    Date Time Interrogation Session [phone]    Pulse Gen Model [id number] Reveal LINQ    Pulse Gen Serial Number [id number]    Clinic Name [facility]    Implantable Pulse Generator Type ICM/ILR    Implantable Pulse Generator Implant Date [id number]    Eval Rhythm SR 80bpm       Assessment & Plan:   Problem List Items Addressed This Visit      Unprioritized   Muscle spasm    Right arm.  No signs of vascular compramise.  Recommended Magnesium supplement.  Rx for Baclofen prn  Superficial bruising of arm, right, initial encounter     Reassurance.  Multiple bruises bilateral arms.  Burn net given to hold bandages in place.  Stop Meloxicam due to blood thinners  Follow up plan: Return for for appt in 2 weeks.

## 2018-02-01 NOTE — Assessment & Plan Note (Signed)
Right arm.  No signs of vascular compramise.  Recommended Magnesium supplement.  Rx for Baclofen prn

## 2018-02-02 DIAGNOSIS — Z48812 Encounter for surgical aftercare following surgery on the circulatory system: Secondary | ICD-10-CM | POA: Diagnosis not present

## 2018-02-02 DIAGNOSIS — I2510 Atherosclerotic heart disease of native coronary artery without angina pectoris: Secondary | ICD-10-CM | POA: Diagnosis not present

## 2018-02-02 DIAGNOSIS — I69354 Hemiplegia and hemiparesis following cerebral infarction affecting left non-dominant side: Secondary | ICD-10-CM | POA: Diagnosis not present

## 2018-02-02 DIAGNOSIS — M1991 Primary osteoarthritis, unspecified site: Secondary | ICD-10-CM | POA: Diagnosis not present

## 2018-02-02 DIAGNOSIS — J439 Emphysema, unspecified: Secondary | ICD-10-CM | POA: Diagnosis not present

## 2018-02-02 DIAGNOSIS — G5702 Lesion of sciatic nerve, left lower limb: Secondary | ICD-10-CM | POA: Diagnosis not present

## 2018-02-03 ENCOUNTER — Other Ambulatory Visit: Payer: Self-pay

## 2018-02-04 DIAGNOSIS — G5702 Lesion of sciatic nerve, left lower limb: Secondary | ICD-10-CM | POA: Diagnosis not present

## 2018-02-04 DIAGNOSIS — I69354 Hemiplegia and hemiparesis following cerebral infarction affecting left non-dominant side: Secondary | ICD-10-CM | POA: Diagnosis not present

## 2018-02-04 DIAGNOSIS — M1991 Primary osteoarthritis, unspecified site: Secondary | ICD-10-CM | POA: Diagnosis not present

## 2018-02-04 DIAGNOSIS — I2510 Atherosclerotic heart disease of native coronary artery without angina pectoris: Secondary | ICD-10-CM | POA: Diagnosis not present

## 2018-02-04 DIAGNOSIS — J439 Emphysema, unspecified: Secondary | ICD-10-CM | POA: Diagnosis not present

## 2018-02-04 DIAGNOSIS — Z48812 Encounter for surgical aftercare following surgery on the circulatory system: Secondary | ICD-10-CM | POA: Diagnosis not present

## 2018-02-04 MED ORDER — ATORVASTATIN CALCIUM 40 MG PO TABS: 40.0000 mg | ORAL_TABLET | Freq: Every day | ORAL | 0 refills | Status: DC

## 2018-02-04 MED ORDER — CLOPIDOGREL BISULFATE 75 MG PO TABS: 75.0000 mg | ORAL_TABLET | Freq: Every day | ORAL | 0 refills | Status: DC

## 2018-02-08 ENCOUNTER — Ambulatory Visit (INDEPENDENT_AMBULATORY_CARE_PROVIDER_SITE_OTHER): Payer: Medicare Other | Admitting: *Deleted

## 2018-02-08 DIAGNOSIS — I6310 Cerebral infarction due to embolism of unspecified precerebral artery: Secondary | ICD-10-CM | POA: Diagnosis not present

## 2018-02-08 NOTE — Progress Notes (Signed)
Carelink Summary Report / Loop Recorder 

## 2018-02-09 DIAGNOSIS — J439 Emphysema, unspecified: Secondary | ICD-10-CM | POA: Diagnosis not present

## 2018-02-09 DIAGNOSIS — M1991 Primary osteoarthritis, unspecified site: Secondary | ICD-10-CM | POA: Diagnosis not present

## 2018-02-09 DIAGNOSIS — G5702 Lesion of sciatic nerve, left lower limb: Secondary | ICD-10-CM | POA: Diagnosis not present

## 2018-02-09 DIAGNOSIS — I69354 Hemiplegia and hemiparesis following cerebral infarction affecting left non-dominant side: Secondary | ICD-10-CM | POA: Diagnosis not present

## 2018-02-09 DIAGNOSIS — I2510 Atherosclerotic heart disease of native coronary artery without angina pectoris: Secondary | ICD-10-CM | POA: Diagnosis not present

## 2018-02-09 DIAGNOSIS — Z48812 Encounter for surgical aftercare following surgery on the circulatory system: Secondary | ICD-10-CM | POA: Diagnosis not present

## 2018-02-10 ENCOUNTER — Ambulatory Visit
Admission: EM | Admit: 2018-02-10 | Discharge: 2018-02-10 | Disposition: A | Discharge status: Home / Self Care | Payer: Medicare Other | Attending: Family Medicine | Admitting: Family Medicine

## 2018-02-10 ENCOUNTER — Other Ambulatory Visit: Payer: Self-pay

## 2018-02-10 ENCOUNTER — Ambulatory Visit: Payer: Self-pay

## 2018-02-10 ENCOUNTER — Telehealth: Payer: Self-pay

## 2018-02-10 ENCOUNTER — Telehealth: Payer: Self-pay | Admitting: Cardiovascular Disease

## 2018-02-10 DIAGNOSIS — S40021A Contusion of right upper arm, initial encounter: Secondary | ICD-10-CM

## 2018-02-10 DIAGNOSIS — S41111A Laceration without foreign body of right upper arm, initial encounter: Secondary | ICD-10-CM

## 2018-02-10 MED ORDER — PANTOPRAZOLE SODIUM 40 MG PO TBEC: 40.0000 mg | DELAYED_RELEASE_TABLET | Freq: Every day | ORAL | 3 refills | Status: DC

## 2018-02-10 NOTE — Telephone Encounter (Signed)
Elbow should be seen to see if stitches are needed.  Ok to go to urgent care.  Will change Omeprazole

## 2018-02-10 NOTE — Telephone Encounter (Signed)
I left a message for the patient that her message was received and that we will forward to Dr. Rockey Situ as an FYI only.  I asked that she call back with any further questions or concerns.

## 2018-02-10 NOTE — Telephone Encounter (Signed)
Patient called in and left 2 voicemails on my phone.   First message- patient states that she is currently taking plavix and omeprazole. States that Dr. Richardson Landry with [facility] told her that she should not be taking the omeprazole with the plavix so she would like to be changed to something else for the acid reflux.  Second message from patient's husband- Called in early this morning and states that the patient has hit her elbow causing a gash about an inch long. States they were having trouble getting the gash to stop bleeding so she did not take her plavix this morning. They would like to know what needs to be done about this.   Please advise both messages from patient. Routing high priority due to second VM.

## 2018-02-10 NOTE — ED Triage Notes (Addendum)
Pt reports she slipped off a stool last evening and banged her right upper arm on a door frame.Pt with skin tear proximal to right elbow.  Pt is on Plavix so wanted to be checked. Has appt at wound center tomorrow.

## 2018-02-10 NOTE — Telephone Encounter (Signed)
Called and spoke with patient's husband. They are currently at Urgent Care for the patient's arm. Let them know that is was Malachy Mood suggested anyway. Also let him know about medication change that the patient called in about yesterday and that the new medication had been sent to [facility]. He stated that he would tell the patient.

## 2018-02-10 NOTE — Telephone Encounter (Signed)
Called patient. She just arrived at urgent care to have them look at her injury. She says it has stopped bleeding but she's afraid it will start back. Advised her to have them look at it but if possible the goal would be to continue the aspirin and plavix as long as bleeding is stable. Advised patient to call us back with an update. She verbalized understanding.

## 2018-02-10 NOTE — ED Provider Notes (Signed)
[facility] ____________________________________________  Time seen: Approximately 2:00 PM  I have reviewed the triage vital signs and the nursing notes.   HISTORY  Chief Complaint Arm Injury   HPI Sharon [name] is a 72 y.o. female presenting with husband at bedside for evaluation of right elbow injury that occurred last night at home.  Patient reports that she was sitting on a very low stool cleaning the baseboards, and reports the stool broke causing her to fall.  States she hit the back of her right elbow on the door frame causing her injury.  Denies other pain or injury.  Denies head injury or loss of consciousness.  Reports tetanus immunization is up-to-date.  Reports right-hand dominant.  States right arm she still has full range of motion present, but with some soreness and tightness.  No paresthesias, pain radiation or decreased grip.  Does take Plavix.  Denies other aggravating factors. Denies chest pain, shortness of breath, abdominal pain.   Kathrine Haddock, NP: PCP   Past Medical History:  Diagnosis Date  . Anxiety   . Arthritis    hands, upper back  . Asthma   . COPD (chronic obstructive pulmonary disease) ([place])   . History of cervical cancer   . Menopausal disorder   . Osteoporosis   . Pneumonia 1960  . Spasm of abdominal muscles of right side    intermittent  . TMJ (dislocation of temporomandibular joint)   . Wears dentures    partial lower    Patient Active Problem List   Diagnosis Date Noted  . Superficial bruising of arm, right, initial encounter 02/01/2018  . Muscle spasm 02/01/2018  . Aortic atherosclerosis ([place]) 01/13/2018  . Status post placement of implantable loop recorder 01/12/2018  . Left-sided weakness 01/12/2018  . Chronic kidney disease, stage 3 ([place]) 01/12/2018  . Cerebrovascular accident (CVA) due to embolism of precerebral artery ([place])   . TIA (transient ischemic attack) 01/04/2018  . Piriformis syndrome, left 11/25/2017  .  CAD (coronary artery disease) 11/25/2017  . Caregiver stress 11/25/2017  . Menopause 08/14/2017  . Weight gain 08/14/2017  . De Quervain's tenosynovitis, right 07/17/2017  . Stress incontinence 06/19/2017  . Personal history of tobacco use, presenting hazards to health 04/01/2017  . Anxiety 03/13/2017  . Glossitis 11/14/2016  . Advanced care planning/counseling discussion 10/17/2016  . Other fecal abnormalities   . Benign neoplasm of sigmoid colon   . COPD (chronic obstructive pulmonary disease) ([place]) 10/17/2015  . Mass of upper inner quadrant of right breast 06/22/2015    Past Surgical History:  Procedure Laterality Date  . ABDOMINAL HYSTERECTOMY  1970's  . bladder botox  2005  . BLADDER SUSPENSION  2004  . COLONOSCOPY WITH PROPOFOL N/A 12/13/2015   Procedure: COLONOSCOPY WITH PROPOFOL;  Surgeon: Lucilla Lame, MD;  Location: [place];  Service: Endoscopy;  Laterality: N/A;  . LOOP RECORDER INSERTION N/A 01/07/2018   Procedure: LOOP RECORDER INSERTION;  Surgeon: Deboraha Sprang, MD;  Location: [facility];  Service: Cardiovascular;  Laterality: N/A;  . POLYPECTOMY N/A 12/13/2015   Procedure: POLYPECTOMY;  Surgeon: Lucilla Lame, MD;  Location: [place];  Service: Endoscopy;  Laterality: N/A;  SIGMOID COLON POLYPS X  5  . SHOULDER ARTHROSCOPY W/ ROTATOR CUFF REPAIR Right 1998  . TEE WITHOUT CARDIOVERSION N/A 01/06/2018   Procedure: TRANSESOPHAGEAL ECHOCARDIOGRAM (TEE);  Surgeon: Minna Merritts, MD;  Location: [facility];  Service: Cardiovascular;  Laterality: N/A;  . TONSILLECTOMY AND ADENOIDECTOMY  Current Facility-Administered Medications:  .  cyanocobalamin ((VITAMIN B-12)) injection 1,000 mcg, 1,000 mcg, Intramuscular, Q30 days, Kathrine Haddock, NP, 1,000 mcg at 01/14/18 8469  Current Outpatient Medications:  .  albuterol (PROAIR HFA) 108 (90 Base) MCG/ACT inhaler, Inhale 2 puffs into the lungs every 6 (six) hours as needed for wheezing or  shortness of breath., Disp: 8.5 Inhaler, Rfl: 12 .  aspirin EC 81 MG tablet, Take 81 mg by mouth every other day. , Disp: , Rfl:  .  atorvastatin (LIPITOR) 40 MG tablet, Take 1 tablet (40 mg total) by mouth daily at 6 PM., Disp: 30 tablet, Rfl: 0 .  b complex vitamins capsule, Take 1 capsule by mouth daily., Disp: , Rfl:  .  baclofen (LIORESAL) 10 MG tablet, Take 1 tablet (10 mg total) by mouth 3 (three) times daily., Disp: 30 each, Rfl: 0 .  budesonide-formoterol (SYMBICORT) 160-4.5 MCG/ACT inhaler, Inhale 2 puffs into the lungs 2 (two) times daily., Disp: , Rfl:  .  Cholecalciferol (VITAMIN D) 2000 units tablet, Take 2,000 Units by mouth daily., Disp: , Rfl:  .  citalopram (CELEXA) 20 MG tablet, Take 1 tablet (20 mg total) by mouth daily., Disp: 30 tablet, Rfl: 3 .  clopidogrel (PLAVIX) 75 MG tablet, Take 1 tablet (75 mg total) by mouth daily., Disp: 30 tablet, Rfl: 0 .  Coenzyme Q10 (CO Q 10 PO), Take 1 tablet by mouth daily., Disp: , Rfl:  .  Cyanocobalamin (B-12 PO), Take 1 tablet by mouth daily., Disp: , Rfl:  .  Cyanocobalamin 1000 MCG/ML KIT, Inject 1,000 mcg as directed every 30 (thirty) days., Disp: , Rfl:  .  estradiol (CLIMARA - DOSED IN MG/24 HR) 0.1 mg/24hr patch, Place 0.1 mg onto the skin 2 (two) times a week., Disp: , Rfl:  .  estradiol (VIVELLE-DOT) 0.1 MG/24HR patch, APPLY 1 PATCH ONTO THE SKIN 2 TIMES A WEEK, Disp: 8 patch, Rfl: 11 .  guaiFENesin (MUCINEX) 600 MG 12 hr tablet, Take 1 tablet (600 mg total) by mouth 2 (two) times daily., Disp: 30 tablet, Rfl: 0 .  Multiple Vitamin (MULTIVITAMIN) tablet, Take 2 tablets by mouth daily. , Disp: , Rfl:  .  pantoprazole (PROTONIX) 40 MG tablet, Take 1 tablet (40 mg total) by mouth daily., Disp: 90 tablet, Rfl: 3 .  SUPER GARLIC PO, Take 2 tablets by mouth daily., Disp: , Rfl:  .  Tiotropium Bromide Monohydrate (SPIRIVA RESPIMAT) 2.5 MCG/ACT AERS, Inhale 2 puffs into the lungs daily., Disp: 1 Inhaler, Rfl: 12  Allergies Librium  [chlordiazepoxide]; Bactrim [sulfamethoxazole-trimethoprim]; Ibuprofen; Latex; Naprosyn [naproxen]; Other; and Strawberry extract  Family History  Problem Relation Age of Onset  . Diabetes Mother   . Heart disease Mother   . Stroke Mother   . Stroke Maternal Grandmother     Social History Social History   Tobacco Use  . Smoking status: Former Smoker    Packs/day: 0.60    Years: 56.00    Pack years: 33.60    Types: Cigarettes    Last attempt to quit: 07/14/2016    Years since quitting: 1.5  . Smokeless tobacco: Never Used  . Tobacco comment: has smoked off and on  Substance Use Topics  . Alcohol use: Yes    Alcohol/week: 0.0 oz    Comment: occasionally - special occasions  . Drug use: No    Review of Systems Constitutional: No fever/chills Eyes: No visual changes. Cardiovascular: Denies chest pain. Respiratory: Denies shortness of breath. Gastrointestinal: No abdominal pain.  No nausea, no vomiting.  Musculoskeletal: Negative for back pain. As above.  Skin: Skin changes to right elbow. Neurological: Negative for headaches, focal weakness or numbness.  ____________________________________________   PHYSICAL EXAM:  VITAL SIGNS: ED Triage Vitals  Enc Vitals Group     BP 02/10/18 1321 (!) 149/88     Pulse -- 74     Resp 02/10/18 1321 16     Temp 02/10/18 1321 97.7 F (36.5 C)     Temp Source 02/10/18 1321 Oral     SpO2 02/10/18 1321 97 %     Weight 02/10/18 1322 192 lb (87.1 kg)     Height 02/10/18 1322 5' 9"  (1.753 m)     Head Circumference --      Peak Flow --      Pain Score 02/10/18 1322 2     Pain Loc --      Pain Edu? --      Excl. in [place]? --     Constitutional: Alert and oriented. Well appearing and in no acute distress. ENT      Head: Normocephalic and atraumatic. Cardiovascular: Normal rate, regular rhythm. Grossly normal heart sounds.  Good peripheral circulation. Respiratory: Normal respiratory effort without tachypnea nor retractions. Breath  sounds are clear and equal bilaterally. No wheezes, rales, rhonchi. Musculoskeletal:No midline cervical, thoracic or lumbar tenderness to palpation. Bilateral distal radial pulses equal and easily palpated. Except: Right long distal posterior elbow posterior humerus approximately 3.5 cm moon shape skin tear present with less than 0.5 cm skin tear beneath it, skin overall well approximated, no active bleeding, mild direct tenderness, no further surrounding tenderness, range of motion present, no pain with pronation or supination, right upper extremity otherwise nontender. Neurologic:  Normal speech and language. No gross focal neurologic deficits are appreciated. Speech is normal. No gait instability.  Skin:  Skin is warm, dry. As above.  Psychiatric: Mood and affect are normal. Speech and behavior are normal. Patient exhibits appropriate insight and judgment   ___________________________________________   LABS (all labs ordered are listed, but only abnormal results are displayed)  Labs Reviewed - No data to display ____________________________________________   PROCEDURES Procedures  Procedure(s) performed:  Procedure explained and verbal consent obtained. Consent: Verbal consent obtained. Written consent not obtained. Risks and benefits: risks, benefits and alternatives were discussed Patient identity confirmed: verbally with patient and hospital-assigned identification number  Consent given by: patient   Skin Repair Location: right elbow Length: 3.5 cm Foreign bodies: no foreign bodies Tendon involvement: none Nerve involvement: none Preparation: Patient was prepped and draped in the usual sterile fashion. Anesthesia: none Cleaned with betadine Irrigation solution: saline Irrigation method: jet lavage Amount of cleaning: copious Repaired with x 2 steristrips and dermabond applied to steristrips Patient tolerate well. Wound well approximated post repair.  Antibiotic ointment  and dressing applied.  Wound care instructions provided.  Observe for any signs of infection or other problems.      INITIAL IMPRESSION / ASSESSMENT AND PLAN / ED COURSE  Pertinent labs & imaging results that were available during my care of the patient were reviewed by me and considered in my medical decision making (see chart for details).   No acute distress.  Right upper arm skin tear mechanical injury that occurred last night.  Denies other injury.  No clear point bony tenderness, mild diffuse tenderness around skin tear site, full range of motion present, no motor or tendon deficits noted.  Discussed evaluation of x-ray at this time, patient  has been declined and states he will monitor at home and reevaluate for continued pain.  Wound copiously cleaned and irrigated, Steri-Strip and Dermabond utilized.  Patient states that she schedule an appointment for this coming Monday at the wound clinic and will keep that follow-up.  Discussed follow up with Primary care physician this week. Discussed follow up and return parameters including no resolution or any worsening concerns. Patient verbalized understanding and agreed to plan.   ____________________________________________   FINAL CLINICAL IMPRESSION(S) / ED DIAGNOSES  Final diagnoses:  Skin tear of right upper arm without complication, initial encounter  Arm contusion, right, initial encounter     ED Discharge Orders    None       Note: This dictation was prepared with Dragon dictation along with smaller phrase technology. Any transcriptional errors that result from this process are unintentional.         Marylene Land, NP 02/10/18 1500

## 2018-02-10 NOTE — Discharge Instructions (Addendum)
Keep clean. Monitor.   Follow up with your primary care physician this week as needed. Return to Urgent care for new or worsening concerns.

## 2018-02-10 NOTE — Telephone Encounter (Signed)
Patient called in with c/o "wound to arm." She says "yesterday a stool slid out from under me and I fell against the door frame and tore open a blood blister on my right upper arm towards the back of the bicep. It bled a lot because I am on Aspirin and Plavix. I washed it with water and applied a dressing. I'm calling because I think it needs to be looked at today or I can call the wound center to ask them about looking at it." I asked is it bleeding through the bandage today, she says "no, but it's stuck to the bandage. I didn't take my Aspirin and Plavix today because I didn't want it to bleed when the dressing comes off." According to protocol, see PCP within 3 days. Patient wants to be seen today. No availability with PCP or providers in practice, I offered an appointment tomorrow with Merrie Roof, PA. I called the office and spoke to [place], [place] to see if the patient was able to be worked in, she advised no availability today and said to advise the patient to go to UC or call the wound center. I advised the patient of the above, she says "I will call the wound center and ask if I can be seen and if not, I will call back to schedule that appointment tomorrow."   Reason for Disposition . [1] Last tetanus shot > 5 years ago AND [2] DIRTY cut  Answer Assessment - Initial Assessment Questions 1. APPEARANCE of INJURY: "What does the injury look like?"      Blood blister torn open when hit against wall 2. SIZE: "How large is the cut?"      Unknown 3. BLEEDING: "Is it bleeding now?" If so, ask: "Is it difficult to stop?"      No; on plavix and aspirin 4. LOCATION: "Where is the injury located?"      Right upper posterior arm 5. ONSET: "How long ago did the injury occur?"      Yesterday 6. MECHANISM: "Tell me how it happened."      Feel against the door frame and a blister tore open 7. TETANUS: "When was the last tetanus booster?"     Unknown 8. PREGNANCY: "Is there any chance you are pregnant?"  "When was your last menstrual period?"     No  Protocols used: [place]

## 2018-02-10 NOTE — Telephone Encounter (Signed)
Pt states she had a incident where she went to sit on a stool and it fell out from under her, and she has a gash on her arm. She has stopped taking her Plavix. Please call to discuss.

## 2018-02-10 NOTE — Telephone Encounter (Signed)
Pt calling stating she is just now leaving urgent care They had put some steri strips on her and told her she could continue to take her Plavix   Just calling to let us know.

## 2018-02-11 DIAGNOSIS — Z48812 Encounter for surgical aftercare following surgery on the circulatory system: Secondary | ICD-10-CM | POA: Diagnosis not present

## 2018-02-11 DIAGNOSIS — G5702 Lesion of sciatic nerve, left lower limb: Secondary | ICD-10-CM | POA: Diagnosis not present

## 2018-02-11 DIAGNOSIS — J439 Emphysema, unspecified: Secondary | ICD-10-CM | POA: Diagnosis not present

## 2018-02-11 DIAGNOSIS — M1991 Primary osteoarthritis, unspecified site: Secondary | ICD-10-CM | POA: Diagnosis not present

## 2018-02-11 DIAGNOSIS — I69354 Hemiplegia and hemiparesis following cerebral infarction affecting left non-dominant side: Secondary | ICD-10-CM | POA: Diagnosis not present

## 2018-02-11 DIAGNOSIS — I2510 Atherosclerotic heart disease of native coronary artery without angina pectoris: Secondary | ICD-10-CM | POA: Diagnosis not present

## 2018-02-12 DIAGNOSIS — I2510 Atherosclerotic heart disease of native coronary artery without angina pectoris: Secondary | ICD-10-CM | POA: Diagnosis not present

## 2018-02-12 DIAGNOSIS — Z48812 Encounter for surgical aftercare following surgery on the circulatory system: Secondary | ICD-10-CM | POA: Diagnosis not present

## 2018-02-12 DIAGNOSIS — M1991 Primary osteoarthritis, unspecified site: Secondary | ICD-10-CM | POA: Diagnosis not present

## 2018-02-12 DIAGNOSIS — I69354 Hemiplegia and hemiparesis following cerebral infarction affecting left non-dominant side: Secondary | ICD-10-CM | POA: Diagnosis not present

## 2018-02-12 DIAGNOSIS — J439 Emphysema, unspecified: Secondary | ICD-10-CM | POA: Diagnosis not present

## 2018-02-12 DIAGNOSIS — G5702 Lesion of sciatic nerve, left lower limb: Secondary | ICD-10-CM | POA: Diagnosis not present

## 2018-02-15 ENCOUNTER — Telehealth: Payer: Self-pay | Admitting: *Deleted

## 2018-02-15 ENCOUNTER — Ambulatory Visit: Payer: Medicare Other | Admitting: Physician Assistant

## 2018-02-15 DIAGNOSIS — Z87891 Personal history of nicotine dependence: Secondary | ICD-10-CM

## 2018-02-15 DIAGNOSIS — Z122 Encounter for screening for malignant neoplasm of respiratory organs: Secondary | ICD-10-CM

## 2018-02-15 NOTE — Telephone Encounter (Signed)
Notified patient that annual lung cancer screening low dose CT scan is due currently or will be in near future. Confirmed that patient is within the age range of 55-77, and asymptomatic, (no signs or symptoms of lung cancer). Patient denies illness that would prevent curative treatment for lung cancer if found. Verified smoking history, (former, quit 2017, 33.6 pack year). The shared decision making visit was done 03/31/17. Patient is agreeable for CT scan being scheduled.

## 2018-02-16 ENCOUNTER — Encounter: Payer: Self-pay | Admitting: Unknown Physician Specialty

## 2018-02-16 ENCOUNTER — Ambulatory Visit (INDEPENDENT_AMBULATORY_CARE_PROVIDER_SITE_OTHER): Payer: Medicare Other | Admitting: Unknown Physician Specialty

## 2018-02-16 VITALS — BP 112/66 | HR 98 | Temp 97.9°F | Ht 69.0 in | Wt 190.4 lb

## 2018-02-16 DIAGNOSIS — I6310 Cerebral infarction due to embolism of unspecified precerebral artery: Secondary | ICD-10-CM

## 2018-02-16 DIAGNOSIS — I2510 Atherosclerotic heart disease of native coronary artery without angina pectoris: Secondary | ICD-10-CM | POA: Diagnosis not present

## 2018-02-16 DIAGNOSIS — T148XXA Other injury of unspecified body region, initial encounter: Secondary | ICD-10-CM | POA: Diagnosis not present

## 2018-02-16 DIAGNOSIS — S41111D Laceration without foreign body of right upper arm, subsequent encounter: Secondary | ICD-10-CM

## 2018-02-16 DIAGNOSIS — G5702 Lesion of sciatic nerve, left lower limb: Secondary | ICD-10-CM | POA: Diagnosis not present

## 2018-02-16 DIAGNOSIS — K140 Glossitis: Secondary | ICD-10-CM | POA: Diagnosis not present

## 2018-02-16 DIAGNOSIS — Z48812 Encounter for surgical aftercare following surgery on the circulatory system: Secondary | ICD-10-CM | POA: Diagnosis not present

## 2018-02-16 DIAGNOSIS — J301 Allergic rhinitis due to pollen: Secondary | ICD-10-CM

## 2018-02-16 DIAGNOSIS — I69354 Hemiplegia and hemiparesis following cerebral infarction affecting left non-dominant side: Secondary | ICD-10-CM | POA: Diagnosis not present

## 2018-02-16 DIAGNOSIS — N289 Disorder of kidney and ureter, unspecified: Secondary | ICD-10-CM

## 2018-02-16 DIAGNOSIS — M1991 Primary osteoarthritis, unspecified site: Secondary | ICD-10-CM | POA: Diagnosis not present

## 2018-02-16 DIAGNOSIS — J309 Allergic rhinitis, unspecified: Secondary | ICD-10-CM | POA: Insufficient documentation

## 2018-02-16 DIAGNOSIS — J439 Emphysema, unspecified: Secondary | ICD-10-CM | POA: Diagnosis not present

## 2018-02-16 NOTE — Patient Instructions (Signed)
[facility] Patient relations- [phone]

## 2018-02-16 NOTE — Progress Notes (Signed)
BP 112/66   Pulse 98   Temp 97.9 F (36.6 C) (Oral)   Ht 5\' 9"  (1.753 m)   Wt 190 lb 6.4 oz (86.4 kg)   LMP  (LMP Unknown)   SpO2 98%   BMI 28.12 kg/m    Subjective:    Patient ID: Sharon [name], female    DOB: 05-Oct-1946, 72 y.o.   MRN: [id number]  HPI: Sharon [name] is a 72 y.o. female  Chief Complaint  Patient presents with  . Bleeding/Bruising    1 month f/up  . Referral    pt states she would like to go she an allergy doctor, states her voice and throat are scratchy   Bruising/Muscle spasm Noted with starting Plavix. Last visit stopped the Meloxicam.  Muscle spasm's continue but not as bad.     Laceration Went to urgent care.  Steri-strips were placed.  Reviewed urgent care note.   Allergies Pt would like a referral to allergist due to hoarseness, watery eyes, and sneezing.  Worse when she goes out.  Clariton D worked in the past.    Renal insufficiency Last GFR was 38.  Needs recheck   Relevant past medical, surgical, family and social history reviewed and updated as indicated. Interim medical history since our last visit reviewed. Allergies and medications reviewed and updated.  Review of Systems  Per HPI unless specifically indicated above     Objective:    BP 112/66   Pulse 98   Temp 97.9 F (36.6 C) (Oral)   Ht 5\' 9"  (1.753 m)   Wt 190 lb 6.4 oz (86.4 kg)   LMP  (LMP Unknown)   SpO2 98%   BMI 28.12 kg/m   Wt Readings from Last 3 Encounters:  02/16/18 190 lb 6.4 oz (86.4 kg)  02/10/18 192 lb (87.1 kg)  02/01/18 192 lb 4.8 oz (87.2 kg)    Physical Exam  Constitutional: She is oriented to person, place, and time. She appears well-developed and well-nourished. No distress.  HENT:  Head: Normocephalic and atraumatic.  Eyes: Conjunctivae and lids are normal. Right eye exhibits no discharge. Left eye exhibits no discharge. No scleral icterus.  Neck: Normal range of motion. Neck supple. No JVD present. Carotid bruit is not present.    Cardiovascular: Normal rate, regular rhythm and normal heart sounds.  Pulmonary/Chest: Effort normal and breath sounds normal.  Abdominal: Normal appearance. There is no splenomegaly or hepatomegaly.  Musculoskeletal: Normal range of motion.  Neurological: She is alert and oriented to person, place, and time.  Skin: Skin is warm, dry and intact. No rash noted. No pallor.  Psychiatric: She has a normal mood and affect. Her behavior is normal. Judgment and thought content normal.    Results for orders placed or performed in visit on 01/21/18  [facility] [place]  Result Value Ref Range   Pulse Generator Manufacturer MERM    Date Time Interrogation Session [phone]    Pulse Gen Model [id number] Reveal LINQ    Pulse Gen Serial Number [id number]    Clinic Name [facility]    Implantable Pulse Generator Type ICM/ILR    Implantable Pulse Generator Implant Date [id number]    Eval Rhythm SR 80bpm       Assessment & Plan:   Problem List Items Addressed This Visit      Unprioritized   Allergic rhinitis - Primary    New problem.  Discussed with pt OK to take Clariton, Allegra, or ZFlonyrtec for  allergies.  It seems seasonal and I am not sure allergist will give additional benefit.  Flonase causes nose to bleed.        Bruising    Related to Plavix.  Discussed risk/benefit.         Other Visit Diagnoses    Laceration of right upper extremity, subsequent encounter       Laceration is well healed.  Pt ed on care   Renal insufficiency       Relevant Orders   Comprehensive metabolic panel       Follow up plan: Return in about 6 months (around 08/19/2018).

## 2018-02-16 NOTE — Assessment & Plan Note (Addendum)
New problem.  Discussed with pt OK to take Clariton, Allegra, or ZFlonyrtec for allergies.  It seems seasonal and I am not sure allergist will give additional benefit.  Flonase causes nose to bleed.

## 2018-02-16 NOTE — Assessment & Plan Note (Signed)
Related to Plavix.  Discussed risk/benefit.

## 2018-02-17 ENCOUNTER — Encounter: Payer: Self-pay | Admitting: Unknown Physician Specialty

## 2018-02-17 LAB — COMPREHENSIVE METABOLIC PANEL
ALK PHOS: 78 IU/L (ref 39–117)
ALT: 28 IU/L (ref 0–32)
AST: 22 IU/L (ref 0–40)
Albumin/Globulin Ratio: 1.6 (ref 1.2–2.2)
Albumin: 4.1 g/dL (ref 3.5–4.8)
BUN / CREAT RATIO: 16 (ref 12–28)
BUN: 18 mg/dL (ref 8–27)
Bilirubin Total: 0.4 mg/dL (ref 0.0–1.2)
CO2: 23 mmol/L (ref 20–29)
CREATININE: 1.14 mg/dL — AB (ref 0.57–1.00)
Calcium: 9 mg/dL (ref 8.7–10.3)
Chloride: 103 mmol/L (ref 96–106)
GFR, EST AFRICAN AMERICAN: 56 mL/min/{1.73_m2} — AB (ref >59)
GFR, EST NON AFRICAN AMERICAN: 48 mL/min/{1.73_m2} — AB (ref >59)
GLOBULIN, TOTAL: 2.6 g/dL (ref 1.5–4.5)
Glucose: 115 mg/dL — ABNORMAL HIGH (ref 65–99)
Potassium: 4.1 mmol/L (ref 3.5–5.2)
Sodium: 141 mmol/L (ref 134–144)
Total Protein: 6.7 g/dL (ref 6.0–8.5)

## 2018-02-17 NOTE — Progress Notes (Signed)
Normal labs.  Pt notified through mychart

## 2018-02-18 DIAGNOSIS — J439 Emphysema, unspecified: Secondary | ICD-10-CM | POA: Diagnosis not present

## 2018-02-18 DIAGNOSIS — M1991 Primary osteoarthritis, unspecified site: Secondary | ICD-10-CM | POA: Diagnosis not present

## 2018-02-18 DIAGNOSIS — Z48812 Encounter for surgical aftercare following surgery on the circulatory system: Secondary | ICD-10-CM | POA: Diagnosis not present

## 2018-02-18 DIAGNOSIS — I2510 Atherosclerotic heart disease of native coronary artery without angina pectoris: Secondary | ICD-10-CM | POA: Diagnosis not present

## 2018-02-18 DIAGNOSIS — I69354 Hemiplegia and hemiparesis following cerebral infarction affecting left non-dominant side: Secondary | ICD-10-CM | POA: Diagnosis not present

## 2018-02-18 DIAGNOSIS — G5702 Lesion of sciatic nerve, left lower limb: Secondary | ICD-10-CM | POA: Diagnosis not present

## 2018-02-23 DIAGNOSIS — H2511 Age-related nuclear cataract, right eye: Secondary | ICD-10-CM | POA: Diagnosis not present

## 2018-02-24 DIAGNOSIS — J439 Emphysema, unspecified: Secondary | ICD-10-CM | POA: Diagnosis not present

## 2018-02-24 DIAGNOSIS — I69354 Hemiplegia and hemiparesis following cerebral infarction affecting left non-dominant side: Secondary | ICD-10-CM | POA: Diagnosis not present

## 2018-02-24 DIAGNOSIS — G5702 Lesion of sciatic nerve, left lower limb: Secondary | ICD-10-CM | POA: Diagnosis not present

## 2018-02-24 DIAGNOSIS — I2510 Atherosclerotic heart disease of native coronary artery without angina pectoris: Secondary | ICD-10-CM | POA: Diagnosis not present

## 2018-02-24 DIAGNOSIS — M1991 Primary osteoarthritis, unspecified site: Secondary | ICD-10-CM | POA: Diagnosis not present

## 2018-02-24 DIAGNOSIS — Z48812 Encounter for surgical aftercare following surgery on the circulatory system: Secondary | ICD-10-CM | POA: Diagnosis not present

## 2018-02-25 DIAGNOSIS — Z48812 Encounter for surgical aftercare following surgery on the circulatory system: Secondary | ICD-10-CM | POA: Diagnosis not present

## 2018-02-25 DIAGNOSIS — J439 Emphysema, unspecified: Secondary | ICD-10-CM | POA: Diagnosis not present

## 2018-02-25 DIAGNOSIS — G5702 Lesion of sciatic nerve, left lower limb: Secondary | ICD-10-CM | POA: Diagnosis not present

## 2018-02-25 DIAGNOSIS — I2510 Atherosclerotic heart disease of native coronary artery without angina pectoris: Secondary | ICD-10-CM | POA: Diagnosis not present

## 2018-02-25 DIAGNOSIS — M1991 Primary osteoarthritis, unspecified site: Secondary | ICD-10-CM | POA: Diagnosis not present

## 2018-02-25 DIAGNOSIS — I69354 Hemiplegia and hemiparesis following cerebral infarction affecting left non-dominant side: Secondary | ICD-10-CM | POA: Diagnosis not present

## 2018-03-02 DIAGNOSIS — Z48812 Encounter for surgical aftercare following surgery on the circulatory system: Secondary | ICD-10-CM | POA: Diagnosis not present

## 2018-03-02 DIAGNOSIS — G5702 Lesion of sciatic nerve, left lower limb: Secondary | ICD-10-CM | POA: Diagnosis not present

## 2018-03-02 DIAGNOSIS — M1991 Primary osteoarthritis, unspecified site: Secondary | ICD-10-CM | POA: Diagnosis not present

## 2018-03-02 DIAGNOSIS — I69354 Hemiplegia and hemiparesis following cerebral infarction affecting left non-dominant side: Secondary | ICD-10-CM | POA: Diagnosis not present

## 2018-03-02 DIAGNOSIS — J439 Emphysema, unspecified: Secondary | ICD-10-CM | POA: Diagnosis not present

## 2018-03-02 DIAGNOSIS — I2510 Atherosclerotic heart disease of native coronary artery without angina pectoris: Secondary | ICD-10-CM | POA: Diagnosis not present

## 2018-03-03 ENCOUNTER — Other Ambulatory Visit: Payer: Self-pay

## 2018-03-03 LAB — CUP PACEART REMOTE DEVICE CHECK
Date Time Interrogation Session: 20190427123702
MDC IDC PG IMPLANT DT: 20190328

## 2018-03-04 ENCOUNTER — Telehealth: Payer: Self-pay | Admitting: Unknown Physician Specialty

## 2018-03-04 ENCOUNTER — Other Ambulatory Visit: Payer: Self-pay

## 2018-03-04 DIAGNOSIS — I69354 Hemiplegia and hemiparesis following cerebral infarction affecting left non-dominant side: Secondary | ICD-10-CM | POA: Diagnosis not present

## 2018-03-04 DIAGNOSIS — I2510 Atherosclerotic heart disease of native coronary artery without angina pectoris: Secondary | ICD-10-CM | POA: Diagnosis not present

## 2018-03-04 DIAGNOSIS — J439 Emphysema, unspecified: Secondary | ICD-10-CM | POA: Diagnosis not present

## 2018-03-04 DIAGNOSIS — M1991 Primary osteoarthritis, unspecified site: Secondary | ICD-10-CM | POA: Diagnosis not present

## 2018-03-04 DIAGNOSIS — G5702 Lesion of sciatic nerve, left lower limb: Secondary | ICD-10-CM | POA: Diagnosis not present

## 2018-03-04 DIAGNOSIS — Z48812 Encounter for surgical aftercare following surgery on the circulatory system: Secondary | ICD-10-CM | POA: Diagnosis not present

## 2018-03-04 MED ORDER — CLOPIDOGREL BISULFATE 75 MG PO TABS: 75.0000 mg | ORAL_TABLET | Freq: Every day | ORAL | 3 refills | Status: DC

## 2018-03-04 MED ORDER — ATORVASTATIN CALCIUM 40 MG PO TABS: 40.0000 mg | ORAL_TABLET | Freq: Every day | ORAL | 3 refills | Status: DC

## 2018-03-04 NOTE — Discharge Instructions (Signed)

## 2018-03-04 NOTE — Telephone Encounter (Unsigned)
Copied from [place] [id number]. Topic: Quick Communication - Rx Refill/Question >> Mar 04, 2018  1:22 PM Mcneil, Ja-Kwan wrote: Medication: clopidogrel (PLAVIX) 75 MG tablet  and  atorvastatin (LIPITOR) 40 MG tablet   Preferred Pharmacy (with phone number or street name): [place], [name] - [address] [phone] (Phone) [phone] (Fax)   Agent: Please be advised that RX refills may take up to 3 business days. We ask that you follow-up with your pharmacy.

## 2018-03-09 ENCOUNTER — Encounter
Admission: RE | Disposition: A | Discharge status: Home / Self Care | Payer: Self-pay | Source: Ambulatory Visit | Attending: Ophthalmology

## 2018-03-09 ENCOUNTER — Ambulatory Visit: Payer: Medicare Other | Admitting: Anesthesiology

## 2018-03-09 ENCOUNTER — Ambulatory Visit
Admission: RE | Admit: 2018-03-09 | Discharge: 2018-03-09 | Disposition: A | Discharge status: Home / Self Care | Payer: Medicare Other | Source: Ambulatory Visit | Attending: Ophthalmology | Admitting: Ophthalmology

## 2018-03-09 DIAGNOSIS — Z7902 Long term (current) use of antithrombotics/antiplatelets: Secondary | ICD-10-CM | POA: Diagnosis not present

## 2018-03-09 DIAGNOSIS — J449 Chronic obstructive pulmonary disease, unspecified: Secondary | ICD-10-CM | POA: Diagnosis not present

## 2018-03-09 DIAGNOSIS — Z87891 Personal history of nicotine dependence: Secondary | ICD-10-CM | POA: Insufficient documentation

## 2018-03-09 DIAGNOSIS — H25811 Combined forms of age-related cataract, right eye: Secondary | ICD-10-CM | POA: Diagnosis not present

## 2018-03-09 DIAGNOSIS — H2511 Age-related nuclear cataract, right eye: Secondary | ICD-10-CM | POA: Insufficient documentation

## 2018-03-09 DIAGNOSIS — Z8673 Personal history of transient ischemic attack (TIA), and cerebral infarction without residual deficits: Secondary | ICD-10-CM | POA: Insufficient documentation

## 2018-03-09 HISTORY — PX: CATARACT EXTRACTION W/PHACO: SHX586

## 2018-03-09 SURGERY — PHACOEMULSIFICATION, CATARACT, WITH IOL INSERTION
Anesthesia: Monitor Anesthesia Care | Laterality: Right | Wound class: "Clean "

## 2018-03-09 MED ORDER — ACETAMINOPHEN 160 MG/5ML PO SOLN: 325.0000 mg | ORAL | Status: DC | PRN

## 2018-03-09 MED ORDER — SODIUM HYALURONATE 23 MG/ML IO SOLN
INTRAOCULAR | Status: DC | PRN
  Administered 2018-03-09: 0.6 mL via INTRAOCULAR

## 2018-03-09 MED ORDER — ONDANSETRON HCL 4 MG/2ML IJ SOLN: 4.0000 mg | Freq: Once | INTRAMUSCULAR | Status: DC | PRN

## 2018-03-09 MED ORDER — LIDOCAINE HCL (PF) 2 % IJ SOLN
INTRAOCULAR | Status: DC | PRN
  Administered 2018-03-09: 1 mL via INTRAOCULAR

## 2018-03-09 MED ORDER — LACTATED RINGERS IV SOLN: INTRAVENOUS | Status: DC

## 2018-03-09 MED ORDER — ACETAMINOPHEN 325 MG PO TABS: 650.0000 mg | ORAL_TABLET | Freq: Once | ORAL | Status: DC | PRN

## 2018-03-09 MED ORDER — SODIUM HYALURONATE 10 MG/ML IO SOLN
INTRAOCULAR | Status: DC | PRN
  Administered 2018-03-09: 0.55 mL via INTRAOCULAR

## 2018-03-09 MED ORDER — ARMC OPHTHALMIC DILATING DROPS
1.0000 "application " | OPHTHALMIC | Status: DC | PRN
  Administered 2018-03-09 (×3): 1 via OPHTHALMIC

## 2018-03-09 MED ORDER — MOXIFLOXACIN HCL 0.5 % OP SOLN
OPHTHALMIC | Status: DC | PRN
  Administered 2018-03-09: 0.2 mL via OPHTHALMIC

## 2018-03-09 MED ORDER — EPINEPHRINE PF 1 MG/ML IJ SOLN
INTRAMUSCULAR | Status: DC | PRN
  Administered 2018-03-09: 63 mL via OPHTHALMIC

## 2018-03-09 MED ORDER — FENTANYL CITRATE (PF) 100 MCG/2ML IJ SOLN
INTRAMUSCULAR | Status: DC | PRN
  Administered 2018-03-09 (×2): 50 ug via INTRAVENOUS

## 2018-03-09 MED ORDER — MIDAZOLAM HCL 2 MG/2ML IJ SOLN
INTRAMUSCULAR | Status: DC | PRN
  Administered 2018-03-09 (×2): 1 mg via INTRAVENOUS

## 2018-03-09 SURGICAL SUPPLY — 17 items

## 2018-03-09 NOTE — Op Note (Signed)
OPERATIVE NOTE  CORDA SHUTT [id number] 03/09/2018   PREOPERATIVE DIAGNOSIS:  Nuclear sclerotic cataract right eye.  H25.11   POSTOPERATIVE DIAGNOSIS:    Nuclear sclerotic cataract right eye.     PROCEDURE:  Phacoemusification with posterior chamber intraocular lens placement of the right eye   LENS:   Implant Name Type Inv. Item Serial No. Manufacturer Lot No. LRB No. Used  LENS IOL DIOP 25.5 - [id number] Intraocular Lens LENS IOL DIOP 25.5 [id number]  Right 1       PCB00 +25.5   ULTRASOUND TIME: 0 minutes 34 seconds.  CDE 3.96   SURGEON:  Benay Pillow, MD, MPH  ANESTHESIOLOGIST: Anesthesiologist: Darrin Nipper, MD CRNA: Lind Guest, CRNA   ANESTHESIA:  Topical with tetracaine drops augmented with 1% preservative-free intracameral lidocaine.  ESTIMATED BLOOD LOSS: less than 1 mL.   COMPLICATIONS:  None.   DESCRIPTION OF PROCEDURE:  The patient was identified in the holding room and transported to the operating room and placed in the supine position under the operating microscope.  The right eye was identified as the operative eye and it was prepped and draped in the usual sterile ophthalmic fashion.   A 1.0 millimeter clear-corneal paracentesis was made at the 10:30 position. 0.5 ml of preservative-free 1% lidocaine with epinephrine was injected into the anterior chamber.  The anterior chamber was filled with Healon 5 viscoelastic.  A 2.4 millimeter keratome was used to make a near-clear corneal incision at the 8:00 position.  A curvilinear capsulorrhexis was made with a cystotome and capsulorrhexis forceps.  Balanced salt solution was used to hydrodissect and hydrodelineate the nucleus.   Phacoemulsification was then used in stop and chop fashion to remove the lens nucleus and epinucleus.  The remaining cortex was then removed using the irrigation and aspiration handpiece. Healon was then placed into the capsular bag to distend it for lens placement.  A lens was  then injected into the capsular bag.  The remaining viscoelastic was aspirated.   Wounds were hydrated with balanced salt solution.  The anterior chamber was inflated to a physiologic pressure with balanced salt solution.   Intracameral vigamox 0.1 mL undiluted was injected into the eye and a drop placed onto the ocular surface.  No wound leaks were noted.  The patient was taken to the recovery room in stable condition without complications of anesthesia or surgery  Benay Pillow 03/09/2018, 9:39 AM

## 2018-03-09 NOTE — Anesthesia Procedure Notes (Signed)
Procedure Name: MAC Date/Time: 03/09/2018 9:20 AM Performed by: Lind Guest, CRNA Pre-anesthesia Checklist: Patient identified, Emergency Drugs available, Suction available, Patient being monitored and Timeout performed Patient Re-evaluated:Patient Re-evaluated prior to induction Oxygen Delivery Method: Nasal cannula

## 2018-03-09 NOTE — H&P (Signed)
The History and Physical notes are on paper, have been signed, and are to be scanned.   I have examined the patient and there are no changes to the H&P.   Benay Pillow 03/09/2018 9:06 AM

## 2018-03-09 NOTE — Anesthesia Preprocedure Evaluation (Signed)
Anesthesia Evaluation  Patient identified by MRN, date of birth, ID band Patient awake    Reviewed: Allergy & Precautions, NPO status , Patient's Chart, lab work & pertinent test results  History of Anesthesia Complications Negative for: history of anesthetic complications  Airway Mallampati: IV  TM Distance: >3 FB Neck ROM: Full    Dental  (+) Caps,    Pulmonary asthma , COPD, former smoker (quit 2017),    Pulmonary exam normal breath sounds clear to auscultation       Cardiovascular Exercise Tolerance: Good negative cardio ROS Normal cardiovascular exam Rhythm:Regular Rate:Normal     Neuro/Psych PSYCHIATRIC DISORDERS Anxiety CVA (12/2017 on Plavix; occasional left leg weakness; ambulates independently)    GI/Hepatic negative GI ROS,   Endo/Other  negative endocrine ROS  Renal/GU negative Renal ROS     Musculoskeletal  (+) Arthritis , Osteoarthritis,    Abdominal   Peds  Hematology negative hematology ROS (+)   Anesthesia Other Findings   Reproductive/Obstetrics                             Anesthesia Physical Anesthesia Plan  ASA: III  Anesthesia Plan: MAC   Post-op Pain Management:    Induction: Intravenous  PONV Risk Score and Plan: 2 and Midazolam, TIVA and Treatment may vary due to age or medical condition  Airway Management Planned: Natural Airway  Additional Equipment:   Intra-op Plan:   Post-operative Plan:   Informed Consent: I have reviewed the patients History and Physical, chart, labs and discussed the procedure including the risks, benefits and alternatives for the proposed anesthesia with the patient or authorized representative who has indicated his/her understanding and acceptance.     Plan Discussed with: CRNA  Anesthesia Plan Comments:         Anesthesia Quick Evaluation

## 2018-03-09 NOTE — Anesthesia Postprocedure Evaluation (Signed)
Anesthesia Post Note  Patient: Sharon [name]  Procedure(s) Performed: CATARACT EXTRACTION PHACO AND INTRAOCULAR LENS PLACEMENT (IOC) right (Right )  Patient location during evaluation: PACU Anesthesia Type: MAC Level of consciousness: awake and alert, oriented and patient cooperative Pain management: pain level controlled Vital Signs Assessment: post-procedure vital signs reviewed and stable Respiratory status: spontaneous breathing, nonlabored ventilation and respiratory function stable Cardiovascular status: blood pressure returned to baseline and stable Postop Assessment: adequate PO intake Anesthetic complications: no    Darrin Nipper

## 2018-03-09 NOTE — Transfer of Care (Signed)
Immediate Anesthesia Transfer of Care Note  Patient: Sharon [name]  Procedure(s) Performed: CATARACT EXTRACTION PHACO AND INTRAOCULAR LENS PLACEMENT (IOC) right (Right )  Patient Location: PACU  Anesthesia Type: MAC  Level of Consciousness: awake, alert  and patient cooperative  Airway and Oxygen Therapy: Patient Spontanous Breathing and Patient connected to supplemental oxygen  Post-op Assessment: Post-op Vital signs reviewed, Patient's Cardiovascular Status Stable, Respiratory Function Stable, Patent Airway and No signs of Nausea or vomiting  Post-op Vital Signs: Reviewed and stable  Complications: No apparent anesthesia complications

## 2018-03-10 ENCOUNTER — Encounter: Payer: Self-pay | Admitting: Ophthalmology

## 2018-03-11 ENCOUNTER — Encounter: Payer: Medicare Other | Admitting: *Deleted

## 2018-03-19 ENCOUNTER — Other Ambulatory Visit: Payer: Self-pay | Admitting: Unknown Physician Specialty

## 2018-03-19 ENCOUNTER — Other Ambulatory Visit (INDEPENDENT_AMBULATORY_CARE_PROVIDER_SITE_OTHER): Payer: Medicare Other

## 2018-03-19 ENCOUNTER — Ambulatory Visit: Payer: Medicare Other

## 2018-03-19 DIAGNOSIS — K140 Glossitis: Secondary | ICD-10-CM

## 2018-03-19 MED ORDER — CYANOCOBALAMIN 1000 MCG/ML IJ SOLN
1000.0000 ug | INTRAMUSCULAR | Status: AC
Start: 2018-03-19 — End: 2019-03-14
  Administered 2018-03-19 – 2019-02-15 (×11): 1000 ug via INTRAMUSCULAR

## 2018-03-19 NOTE — Progress Notes (Signed)
B12 given and documented in [facility]. Lab drawn. The Diagnosis is Glossitis.

## 2018-03-19 NOTE — Progress Notes (Signed)
Lab completed and injection given and documented.

## 2018-03-19 NOTE — Addendum Note (Signed)
Addended by: Sandria Manly on: 03/19/2018 02:22 PM   Modules accepted: Orders

## 2018-03-20 LAB — VITAMIN B12: VITAMIN B 12: 881 pg/mL (ref 232–1245)

## 2018-04-01 ENCOUNTER — Ambulatory Visit
Admission: RE | Admit: 2018-04-01 | Discharge: 2018-04-01 | Disposition: A | Discharge status: Home / Self Care | Payer: Medicare Other | Source: Ambulatory Visit | Attending: Oncology | Admitting: Oncology

## 2018-04-01 DIAGNOSIS — J439 Emphysema, unspecified: Secondary | ICD-10-CM | POA: Diagnosis not present

## 2018-04-01 DIAGNOSIS — I700 Atherosclerosis of aorta: Secondary | ICD-10-CM | POA: Diagnosis not present

## 2018-04-01 DIAGNOSIS — I2510 Atherosclerotic heart disease of native coronary artery without angina pectoris: Secondary | ICD-10-CM | POA: Insufficient documentation

## 2018-04-01 DIAGNOSIS — I719 Aortic aneurysm of unspecified site, without rupture: Secondary | ICD-10-CM | POA: Diagnosis not present

## 2018-04-01 DIAGNOSIS — Z122 Encounter for screening for malignant neoplasm of respiratory organs: Secondary | ICD-10-CM | POA: Insufficient documentation

## 2018-04-01 DIAGNOSIS — Z87891 Personal history of nicotine dependence: Secondary | ICD-10-CM | POA: Diagnosis not present

## 2018-04-05 ENCOUNTER — Encounter: Payer: Self-pay | Admitting: *Deleted

## 2018-04-06 DIAGNOSIS — H2512 Age-related nuclear cataract, left eye: Secondary | ICD-10-CM | POA: Diagnosis not present

## 2018-04-08 ENCOUNTER — Other Ambulatory Visit: Payer: Self-pay

## 2018-04-08 ENCOUNTER — Encounter: Payer: Self-pay | Admitting: *Deleted

## 2018-04-09 NOTE — Anesthesia Preprocedure Evaluation (Addendum)
Anesthesia Evaluation  Patient identified by MRN, date of birth, ID band Patient awake    Reviewed: Allergy & Precautions, NPO status , Patient's Chart, lab work & pertinent test results, reviewed documented beta blocker date and time   Airway Mallampati: III  TM Distance: >3 FB Neck ROM: Full    Dental no notable dental hx.    Pulmonary asthma , COPD, former smoker,    Pulmonary exam normal breath sounds clear to auscultation       Cardiovascular + Peripheral Vascular Disease (Descending aortic aneurysm)  Normal cardiovascular exam Rhythm:Regular Rate:Normal  Normal TTE 12/2017   Neuro/Psych Anxiety CVA (12/2017)    GI/Hepatic negative GI ROS, Neg liver ROS,   Endo/Other  negative endocrine ROS  Renal/GU Renal disease     Musculoskeletal  (+) Arthritis ,   Abdominal Normal abdominal exam  (+)   Peds  Hematology   Anesthesia Other Findings   Reproductive/Obstetrics negative OB ROS                           Anesthesia Physical Anesthesia Plan  ASA: III  Anesthesia Plan: MAC   Post-op Pain Management:    Induction: Intravenous  PONV Risk Score and Plan:   Airway Management Planned: Natural Airway  Additional Equipment: None  Intra-op Plan:   Post-operative Plan:   Informed Consent: I have reviewed the patients History and Physical, chart, labs and discussed the procedure including the risks, benefits and alternatives for the proposed anesthesia with the patient or authorized representative who has indicated his/her understanding and acceptance.     Plan Discussed with: CRNA, Anesthesiologist and Surgeon  Anesthesia Plan Comments:         Anesthesia Quick Evaluation

## 2018-04-12 ENCOUNTER — Telehealth: Payer: Self-pay

## 2018-04-12 NOTE — Discharge Instructions (Signed)

## 2018-04-12 NOTE — Telephone Encounter (Signed)
Called and left patient a VM letting her know that Malachy Mood said that she could continue the B12 injections.

## 2018-04-12 NOTE — Telephone Encounter (Signed)
Continue B12 injections for treatment of glossitis.  Sharon [name], I see the order for injection every 30 days.

## 2018-04-12 NOTE — Telephone Encounter (Signed)
Patient called in and wants to know if she is still supposed to be getting the B12 injections. Patient states that they really help with the burning tongue sensation. I cannot tell if orders are in without being in an encounter but if they are not and the patient is supposed to continue with injections, please enter orders.

## 2018-04-13 ENCOUNTER — Ambulatory Visit (INDEPENDENT_AMBULATORY_CARE_PROVIDER_SITE_OTHER): Payer: Medicare Other | Admitting: *Deleted

## 2018-04-13 DIAGNOSIS — I639 Cerebral infarction, unspecified: Secondary | ICD-10-CM

## 2018-04-13 NOTE — Progress Notes (Signed)
Carelink Summary Report / Loop Recorder 

## 2018-04-16 ENCOUNTER — Ambulatory Visit (INDEPENDENT_AMBULATORY_CARE_PROVIDER_SITE_OTHER): Payer: Medicare Other

## 2018-04-16 DIAGNOSIS — K140 Glossitis: Secondary | ICD-10-CM

## 2018-04-19 ENCOUNTER — Ambulatory Visit: Payer: Medicare Other | Admitting: Anesthesiology

## 2018-04-19 ENCOUNTER — Encounter
Admission: RE | Disposition: A | Discharge status: Home / Self Care | Payer: Self-pay | Source: Ambulatory Visit | Attending: Ophthalmology

## 2018-04-19 ENCOUNTER — Ambulatory Visit
Admission: RE | Admit: 2018-04-19 | Discharge: 2018-04-19 | Disposition: A | Discharge status: Home / Self Care | Payer: Medicare Other | Source: Ambulatory Visit | Attending: Ophthalmology | Admitting: Ophthalmology

## 2018-04-19 DIAGNOSIS — J449 Chronic obstructive pulmonary disease, unspecified: Secondary | ICD-10-CM | POA: Insufficient documentation

## 2018-04-19 DIAGNOSIS — K219 Gastro-esophageal reflux disease without esophagitis: Secondary | ICD-10-CM | POA: Insufficient documentation

## 2018-04-19 DIAGNOSIS — Z79899 Other long term (current) drug therapy: Secondary | ICD-10-CM | POA: Insufficient documentation

## 2018-04-19 DIAGNOSIS — E7800 Pure hypercholesterolemia, unspecified: Secondary | ICD-10-CM | POA: Insufficient documentation

## 2018-04-19 DIAGNOSIS — Z8541 Personal history of malignant neoplasm of cervix uteri: Secondary | ICD-10-CM | POA: Insufficient documentation

## 2018-04-19 DIAGNOSIS — Z886 Allergy status to analgesic agent status: Secondary | ICD-10-CM | POA: Diagnosis not present

## 2018-04-19 DIAGNOSIS — Z87891 Personal history of nicotine dependence: Secondary | ICD-10-CM | POA: Insufficient documentation

## 2018-04-19 DIAGNOSIS — Z8673 Personal history of transient ischemic attack (TIA), and cerebral infarction without residual deficits: Secondary | ICD-10-CM | POA: Insufficient documentation

## 2018-04-19 DIAGNOSIS — M1990 Unspecified osteoarthritis, unspecified site: Secondary | ICD-10-CM | POA: Diagnosis not present

## 2018-04-19 DIAGNOSIS — Z888 Allergy status to other drugs, medicaments and biological substances status: Secondary | ICD-10-CM | POA: Diagnosis not present

## 2018-04-19 DIAGNOSIS — Z91040 Latex allergy status: Secondary | ICD-10-CM | POA: Diagnosis not present

## 2018-04-19 DIAGNOSIS — F419 Anxiety disorder, unspecified: Secondary | ICD-10-CM | POA: Insufficient documentation

## 2018-04-19 DIAGNOSIS — Z7982 Long term (current) use of aspirin: Secondary | ICD-10-CM | POA: Diagnosis not present

## 2018-04-19 DIAGNOSIS — I739 Peripheral vascular disease, unspecified: Secondary | ICD-10-CM | POA: Diagnosis not present

## 2018-04-19 DIAGNOSIS — H2512 Age-related nuclear cataract, left eye: Secondary | ICD-10-CM | POA: Insufficient documentation

## 2018-04-19 DIAGNOSIS — Z882 Allergy status to sulfonamides status: Secondary | ICD-10-CM | POA: Insufficient documentation

## 2018-04-19 DIAGNOSIS — I719 Aortic aneurysm of unspecified site, without rupture: Secondary | ICD-10-CM | POA: Diagnosis not present

## 2018-04-19 DIAGNOSIS — H25812 Combined forms of age-related cataract, left eye: Secondary | ICD-10-CM | POA: Diagnosis not present

## 2018-04-19 HISTORY — PX: CATARACT EXTRACTION W/PHACO: SHX586

## 2018-04-19 SURGERY — PHACOEMULSIFICATION, CATARACT, WITH IOL INSERTION
Anesthesia: Monitor Anesthesia Care | Laterality: Left

## 2018-04-19 MED ORDER — ARMC OPHTHALMIC DILATING DROPS
1.0000 "application " | OPHTHALMIC | Status: DC | PRN
  Administered 2018-04-19 (×3): 1 via OPHTHALMIC

## 2018-04-19 MED ORDER — FENTANYL CITRATE (PF) 100 MCG/2ML IJ SOLN
INTRAMUSCULAR | Status: DC | PRN
  Administered 2018-04-19 (×2): 50 ug via INTRAVENOUS

## 2018-04-19 MED ORDER — ONDANSETRON HCL 4 MG/2ML IJ SOLN: 4.0000 mg | Freq: Once | INTRAMUSCULAR | Status: DC | PRN

## 2018-04-19 MED ORDER — MOXIFLOXACIN HCL 0.5 % OP SOLN
OPHTHALMIC | Status: DC | PRN
  Administered 2018-04-19: 0.2 mL via OPHTHALMIC

## 2018-04-19 MED ORDER — SODIUM HYALURONATE 23 MG/ML IO SOLN
INTRAOCULAR | Status: DC | PRN
  Administered 2018-04-19: 0.6 mL via INTRAOCULAR

## 2018-04-19 MED ORDER — EPINEPHRINE PF 1 MG/ML IJ SOLN
INTRAOCULAR | Status: DC | PRN
  Administered 2018-04-19: 65 mL via OPHTHALMIC

## 2018-04-19 MED ORDER — BALANCED SALT IO SOLN
INTRAOCULAR | Status: DC | PRN
  Administered 2018-04-19: 1 mL via INTRAOCULAR

## 2018-04-19 MED ORDER — MIDAZOLAM HCL 2 MG/2ML IJ SOLN
INTRAMUSCULAR | Status: DC | PRN
  Administered 2018-04-19 (×2): 1 mg via INTRAVENOUS

## 2018-04-19 MED ORDER — LACTATED RINGERS IV SOLN: 10.0000 mL/h | INTRAVENOUS | Status: DC

## 2018-04-19 MED ORDER — SODIUM HYALURONATE 10 MG/ML IO SOLN
INTRAOCULAR | Status: DC | PRN
  Administered 2018-04-19: 0.55 mL via INTRAOCULAR

## 2018-04-19 SURGICAL SUPPLY — 17 items
CANNULA ANT/CHMB 27G (MISCELLANEOUS) ×1 IMPLANT
CANNULA ANT/CHMB 27GA (MISCELLANEOUS) ×2 IMPLANT
DISSECTOR HYDRO NUCLEUS 50X22 (MISCELLANEOUS) ×2 IMPLANT
GLOVE BIO SURGEON STRL SZ8 (GLOVE) ×2 IMPLANT
GLOVE SURG LX 7.5 STRW (GLOVE) ×1
GLOVE SURG LX STRL 7.5 STRW (GLOVE) ×1 IMPLANT
GOWN STRL REUS W/ TWL LRG LVL3 (GOWN DISPOSABLE) ×2 IMPLANT
GOWN STRL REUS W/TWL LRG LVL3 (GOWN DISPOSABLE) ×2
LENS IOL TECNIS ITEC 25.0 (Intraocular Lens) ×1 IMPLANT
MARKER SKIN DUAL TIP RULER LAB (MISCELLANEOUS) ×2 IMPLANT
PACK CATARACT (MISCELLANEOUS) ×2 IMPLANT
PACK DR. KING ARMS (PACKS) ×2 IMPLANT
PACK EYE AFTER SURG (MISCELLANEOUS) ×2 IMPLANT
SYR 3ML LL SCALE MARK (SYRINGE) ×2 IMPLANT
SYR TB 1ML LUER SLIP (SYRINGE) ×2 IMPLANT
WATER STERILE IRR 500ML POUR (IV SOLUTION) ×2 IMPLANT
WIPE NON LINTING 3.25X3.25 (MISCELLANEOUS) ×2 IMPLANT

## 2018-04-19 NOTE — Op Note (Signed)
OPERATIVE NOTE  Sharon [name] [id number] 04/19/2018   PREOPERATIVE DIAGNOSIS:  Nuclear sclerotic cataract left eye.  H25.12   POSTOPERATIVE DIAGNOSIS:    Nuclear sclerotic cataract left eye.     PROCEDURE:  Phacoemusification with posterior chamber intraocular lens placement of the left eye   LENS:   Implant Name Type Inv. Item Serial No. Manufacturer Lot No. LRB No. Used  LENS IOL DIOP 25.0 - [id number] Intraocular Lens LENS IOL DIOP 25.0 [id number] AMO  Left 1       PCB00 +25.0   ULTRASOUND TIME: 0 minutes 25 seconds.  CDE 3.31   SURGEON:  Benay Pillow, MD, MPH   ANESTHESIA:  Topical with tetracaine drops augmented with 1% preservative-free intracameral lidocaine.  ESTIMATED BLOOD LOSS: <1 mL   COMPLICATIONS:  None.   DESCRIPTION OF PROCEDURE:  The patient was identified in the holding room and transported to the operating room and placed in the supine position under the operating microscope.  The left eye was identified as the operative eye and it was prepped and draped in the usual sterile ophthalmic fashion.   A 1.0 millimeter clear-corneal paracentesis was made at the 5:00 position. 0.5 ml of preservative-free 1% lidocaine with epinephrine was injected into the anterior chamber.  The anterior chamber was filled with Healon 5 viscoelastic.  A 2.4 millimeter keratome was used to make a near-clear corneal incision at the 2:00 position.  A curvilinear capsulorrhexis was made with a cystotome and capsulorrhexis forceps.  Balanced salt solution was used to hydrodissect and hydrodelineate the nucleus.   Phacoemulsification was then used in stop and chop fashion to remove the lens nucleus and epinucleus.  The remaining cortex was then removed using the irrigation and aspiration handpiece. Healon was then placed into the capsular bag to distend it for lens placement.  A lens was then injected into the capsular bag.  The remaining viscoelastic was aspirated.   Wounds were hydrated with  balanced salt solution.  The anterior chamber was inflated to a physiologic pressure with balanced salt solution.  Intracameral vigamox 0.1 mL undiltued was injected into the eye and a drop placed onto the ocular surface.  No wound leaks were noted.  The patient was taken to the recovery room in stable condition without complications of anesthesia or surgery  Benay Pillow 04/19/2018, 11:15 AM

## 2018-04-19 NOTE — Anesthesia Procedure Notes (Signed)
Procedure Name: MAC Date/Time: 04/19/2018 10:57 AM Performed by: Lind Guest, CRNA Pre-anesthesia Checklist: Patient identified, Emergency Drugs available, Suction available, Patient being monitored and Timeout performed Patient Re-evaluated:Patient Re-evaluated prior to induction Oxygen Delivery Method: Nasal cannula

## 2018-04-19 NOTE — H&P (Signed)
The History and Physical notes are on paper, have been signed, and are to be scanned.   I have examined the patient and there are no changes to the H&P.   Benay Pillow 04/19/2018 10:37 AM

## 2018-04-19 NOTE — Transfer of Care (Signed)
Immediate Anesthesia Transfer of Care Note  Patient: Sharon [name]  Procedure(s) Performed: CATARACT EXTRACTION PHACO AND INTRAOCULAR LENS PLACEMENT (IOC)  LEFT (Left )  Patient Location: PACU  Anesthesia Type: MAC  Level of Consciousness: awake, alert  and patient cooperative  Airway and Oxygen Therapy: Patient Spontanous Breathing and Patient connected to supplemental oxygen  Post-op Assessment: Post-op Vital signs reviewed, Patient's Cardiovascular Status Stable, Respiratory Function Stable, Patent Airway and No signs of Nausea or vomiting  Post-op Vital Signs: Reviewed and stable  Complications: No apparent anesthesia complications

## 2018-04-19 NOTE — Anesthesia Postprocedure Evaluation (Signed)
Anesthesia Post Note  Patient: Sharon [name]  Procedure(s) Performed: CATARACT EXTRACTION PHACO AND INTRAOCULAR LENS PLACEMENT (IOC)  LEFT (Left )  Patient location during evaluation: PACU Anesthesia Type: MAC Level of consciousness: awake Pain management: pain level controlled Vital Signs Assessment: post-procedure vital signs reviewed and stable Respiratory status: spontaneous breathing Cardiovascular status: blood pressure returned to baseline Postop Assessment: no headache Anesthetic complications: no    Lavonna Monarch

## 2018-04-20 ENCOUNTER — Encounter: Payer: Self-pay | Admitting: Ophthalmology

## 2018-04-26 ENCOUNTER — Other Ambulatory Visit: Payer: Self-pay | Admitting: Unknown Physician Specialty

## 2018-04-28 ENCOUNTER — Other Ambulatory Visit: Payer: Self-pay | Admitting: Unknown Physician Specialty

## 2018-04-29 NOTE — Telephone Encounter (Signed)
Symbicort 160-4.5 mcg inhaler refill request  Don't see where Sharon [name] has prescribed this.   Prescribed by a historical provider  [place] 03/04/18 with [place], [name]

## 2018-05-04 ENCOUNTER — Encounter: Payer: Self-pay | Admitting: Unknown Physician Specialty

## 2018-05-04 ENCOUNTER — Ambulatory Visit (INDEPENDENT_AMBULATORY_CARE_PROVIDER_SITE_OTHER): Payer: Medicare Other | Admitting: Unknown Physician Specialty

## 2018-05-04 VITALS — BP 157/86 | HR 101 | Temp 98.4°F

## 2018-05-04 DIAGNOSIS — N183 Chronic kidney disease, stage 3 unspecified: Secondary | ICD-10-CM

## 2018-05-04 DIAGNOSIS — I712 Thoracic aortic aneurysm, without rupture: Secondary | ICD-10-CM

## 2018-05-04 DIAGNOSIS — I6310 Cerebral infarction due to embolism of unspecified precerebral artery: Secondary | ICD-10-CM | POA: Diagnosis not present

## 2018-05-04 DIAGNOSIS — R5383 Other fatigue: Secondary | ICD-10-CM | POA: Diagnosis not present

## 2018-05-04 DIAGNOSIS — R635 Abnormal weight gain: Secondary | ICD-10-CM | POA: Diagnosis not present

## 2018-05-04 DIAGNOSIS — K140 Glossitis: Secondary | ICD-10-CM | POA: Diagnosis not present

## 2018-05-04 DIAGNOSIS — R140 Abdominal distension (gaseous): Secondary | ICD-10-CM | POA: Diagnosis not present

## 2018-05-04 DIAGNOSIS — I7123 Aneurysm of the descending thoracic aorta, without rupture: Secondary | ICD-10-CM | POA: Insufficient documentation

## 2018-05-04 NOTE — Assessment & Plan Note (Signed)
Improved with B12 injections

## 2018-05-04 NOTE — Progress Notes (Signed)
BP (!) 157/86   Pulse (!) 101   Temp 98.4 F (36.9 C) (Oral)   LMP  (LMP Unknown)   SpO2 94%    Subjective:    Patient ID: Sharon [name], female    DOB: December 07, 1945, 72 y.o.   MRN: [id number]  HPI: Sharon [name] is a 72 y.o. female  Chief Complaint  Patient presents with  . Follow-up  . Weight Gain    pt states she has gained weight recently, would like her thyroid checked   Pt comes in today with frustrations over her weight gain.  She has gained 5 pounds since 5/28 but 16 pounds in the last 6 months and 21 pounds in the last year.  States she has not changed her eating habits.  Has been very active until "my strokes in March."    Feels stomach is getting bigger.  Swells with eating.  No diarrhea, constipation, nausea or vomiting.  Described foul smelling stool  States she is fatigued most of the time.    Aortic aneurism : From the low dose CT screen.  Has an aneurysm that increased from 3.3 cm to 3.5 cm.    Relevant past medical, surgical, family and social history reviewed and updated as indicated. Interim medical history since our last visit reviewed. Allergies and medications reviewed and updated.  Review of Systems  Per HPI unless specifically indicated above     Objective:    BP (!) 157/86   Pulse (!) 101   Temp 98.4 F (36.9 C) (Oral)   LMP  (LMP Unknown)   SpO2 94%   Wt Readings from Last 3 Encounters:  04/19/18 200 lb (90.7 kg)  04/01/18 195 lb (88.5 kg)  03/09/18 195 lb (88.5 kg)    Physical Exam  Constitutional: She is oriented to person, place, and time. She appears well-developed and well-nourished. No distress.  HENT:  Head: Normocephalic and atraumatic.  Eyes: Conjunctivae and lids are normal. Right eye exhibits no discharge. Left eye exhibits no discharge. No scleral icterus.  Neck: Normal range of motion. Neck supple. No JVD present. Carotid bruit is not present.  Cardiovascular: Normal rate, regular rhythm and normal heart sounds.    Pulmonary/Chest: Effort normal and breath sounds normal.  Abdominal: Normal appearance and bowel sounds are normal. She exhibits distension. There is no splenomegaly or hepatomegaly.  Musculoskeletal: Normal range of motion.  Neurological: She is alert and oriented to person, place, and time.  Skin: Skin is warm, dry and intact. No rash noted. No pallor.  Psychiatric: She has a normal mood and affect. Her behavior is normal. Judgment and thought content normal.    Results for orders placed or performed in visit on 03/19/18  Vitamin B12  Result Value Ref Range   Vitamin B-12 881 232 - 1,245 pg/mL      Assessment & Plan:   Problem List Items Addressed This Visit      Unprioritized   Abnormal weight gain    Check TSH. Abdominal US.  ESR.        Relevant Orders   TSH   CBC with Differential/Platelet   Aneurysm of descending thoracic aorta (HCC)    Incidental finding of Descending Aortic Aneurism.  Refer to vascular for an increase in size from 3.3 to 3.8 cm      Relevant Orders   Ambulatory referral to Vascular Surgery   Chronic kidney disease, stage 3 ([place])   Relevant Orders   CBC with Differential/Platelet  VITAMIN D 25 Hydroxy (Vit-D Deficiency, Fractures)   Glossitis    Improved with B12 injections       Other Visit Diagnoses    Abdominal distention    -  Primary   Relevant Orders   US Abdomen Complete   CBC with Differential/Platelet   Fatigue, unspecified type       Relevant Orders   Comprehensive metabolic panel   Sed Rate (ESR)       Follow up plan: Return in about 1 month (around 06/01/2018) for results.

## 2018-05-04 NOTE — Assessment & Plan Note (Signed)
Check TSH. Abdominal US.  ESR.

## 2018-05-04 NOTE — Assessment & Plan Note (Signed)
Incidental finding of Descending Aortic Aneurism.  Refer to vascular for an increase in size from 3.3 to 3.8 cm

## 2018-05-05 LAB — COMPREHENSIVE METABOLIC PANEL
ALBUMIN: 4.2 g/dL (ref 3.5–4.8)
ALT: 30 IU/L (ref 0–32)
AST: 24 IU/L (ref 0–40)
Albumin/Globulin Ratio: 1.6 (ref 1.2–2.2)
Alkaline Phosphatase: 81 IU/L (ref 39–117)
BUN/Creatinine Ratio: 16 (ref 12–28)
BUN: 18 mg/dL (ref 8–27)
Bilirubin Total: 0.4 mg/dL (ref 0.0–1.2)
CALCIUM: 9.5 mg/dL (ref 8.7–10.3)
CO2: 26 mmol/L (ref 20–29)
CREATININE: 1.13 mg/dL — AB (ref 0.57–1.00)
Chloride: 105 mmol/L (ref 96–106)
GFR calc Af Amer: 56 mL/min/{1.73_m2} — ABNORMAL LOW (ref >59)
GFR, EST NON AFRICAN AMERICAN: 49 mL/min/{1.73_m2} — AB (ref >59)
Globulin, Total: 2.6 g/dL (ref 1.5–4.5)
Glucose: 101 mg/dL — ABNORMAL HIGH (ref 65–99)
Potassium: 4.2 mmol/L (ref 3.5–5.2)
SODIUM: 146 mmol/L — AB (ref 134–144)
Total Protein: 6.8 g/dL (ref 6.0–8.5)

## 2018-05-05 LAB — CBC WITH DIFFERENTIAL/PLATELET
Basophils Absolute: 0 10*3/uL (ref 0.0–0.2)
Basos: 0 %
EOS (ABSOLUTE): 0 10*3/uL (ref 0.0–0.4)
EOS: 1 %
HEMOGLOBIN: 14.4 g/dL (ref 11.1–15.9)
Hematocrit: 44.3 % (ref 34.0–46.6)
IMMATURE GRANS (ABS): 0 10*3/uL (ref 0.0–0.1)
IMMATURE GRANULOCYTES: 0 %
Lymphocytes Absolute: 1.4 10*3/uL (ref 0.7–3.1)
Lymphs: 16 %
MCH: 30.4 pg (ref 26.6–33.0)
MCHC: 32.5 g/dL (ref 31.5–35.7)
MCV: 94 fL (ref 79–97)
MONOCYTES: 9 %
Monocytes Absolute: 0.8 10*3/uL (ref 0.1–0.9)
Neutrophils Absolute: 6.2 10*3/uL (ref 1.4–7.0)
Neutrophils: 74 %
Platelets: 250 10*3/uL (ref 150–450)
RBC: 4.74 x10E6/uL (ref 3.77–5.28)
RDW: 12.3 % (ref 12.3–15.4)
WBC: 8.5 10*3/uL (ref 3.4–10.8)

## 2018-05-05 LAB — TSH: TSH: 1.27 u[IU]/mL (ref 0.450–4.500)

## 2018-05-05 LAB — VITAMIN D 25 HYDROXY (VIT D DEFICIENCY, FRACTURES): VIT D 25 HYDROXY: 23.7 ng/mL — AB (ref 30.0–100.0)

## 2018-05-05 LAB — SEDIMENTATION RATE: SED RATE: 7 mm/h (ref 0–40)

## 2018-05-14 ENCOUNTER — Ambulatory Visit: Payer: Medicare Other

## 2018-05-17 ENCOUNTER — Ambulatory Visit (INDEPENDENT_AMBULATORY_CARE_PROVIDER_SITE_OTHER): Payer: Medicare Other | Admitting: *Deleted

## 2018-05-17 ENCOUNTER — Telehealth: Payer: Self-pay

## 2018-05-17 DIAGNOSIS — I639 Cerebral infarction, unspecified: Secondary | ICD-10-CM

## 2018-05-17 NOTE — Telephone Encounter (Signed)
Incoming from [facility] [place], they wanted to verify what the provider was trying to rule out.  Does she want them to focus on the organs or check for ascites.    Tried to reach [facility], no answer, after looking at her last ov note and discussion with another CMA and [place] tech, I told them to focus on the organs, they stated that if there is fluid in the abdomen then that will be noted on the report anyway.

## 2018-05-18 ENCOUNTER — Ambulatory Visit
Admission: RE | Admit: 2018-05-18 | Discharge: 2018-05-18 | Disposition: A | Discharge status: Home / Self Care | Payer: Medicare Other | Source: Ambulatory Visit | Attending: Unknown Physician Specialty | Admitting: Unknown Physician Specialty

## 2018-05-18 ENCOUNTER — Ambulatory Visit (INDEPENDENT_AMBULATORY_CARE_PROVIDER_SITE_OTHER): Payer: Medicare Other

## 2018-05-18 DIAGNOSIS — K140 Glossitis: Secondary | ICD-10-CM

## 2018-05-18 DIAGNOSIS — R140 Abdominal distension (gaseous): Secondary | ICD-10-CM

## 2018-05-18 NOTE — Progress Notes (Signed)
Carelink Summary Report / Loop Recorder 

## 2018-05-20 LAB — CUP PACEART REMOTE DEVICE CHECK
Date Time Interrogation Session: 20190702134106
MDC IDC PG IMPLANT DT: 20190328

## 2018-05-25 ENCOUNTER — Ambulatory Visit (INDEPENDENT_AMBULATORY_CARE_PROVIDER_SITE_OTHER): Payer: Medicare Other | Admitting: Vascular Surgery

## 2018-05-25 ENCOUNTER — Encounter (INDEPENDENT_AMBULATORY_CARE_PROVIDER_SITE_OTHER): Payer: Self-pay | Admitting: Vascular Surgery

## 2018-05-25 VITALS — BP 139/82 | HR 98 | Resp 17 | Ht 69.0 in | Wt 205.0 lb

## 2018-05-25 DIAGNOSIS — I712 Thoracic aortic aneurysm, without rupture: Secondary | ICD-10-CM

## 2018-05-25 DIAGNOSIS — M79604 Pain in right leg: Secondary | ICD-10-CM | POA: Diagnosis not present

## 2018-05-25 DIAGNOSIS — M7989 Other specified soft tissue disorders: Secondary | ICD-10-CM

## 2018-05-25 DIAGNOSIS — N183 Chronic kidney disease, stage 3 unspecified: Secondary | ICD-10-CM

## 2018-05-25 DIAGNOSIS — M79605 Pain in left leg: Secondary | ICD-10-CM

## 2018-05-25 DIAGNOSIS — I6310 Cerebral infarction due to embolism of unspecified precerebral artery: Secondary | ICD-10-CM

## 2018-05-25 DIAGNOSIS — I7123 Aneurysm of the descending thoracic aorta, without rupture: Secondary | ICD-10-CM

## 2018-05-25 NOTE — Patient Instructions (Signed)
Peripheral Vascular Disease Peripheral vascular disease (PVD) is a disease of the blood vessels that are not part of your heart and brain. A simple term for PVD is poor circulation. In most cases, PVD narrows the blood vessels that carry blood from your heart to the rest of your body. This can result in a decreased supply of blood to your arms, legs, and internal organs, like your stomach or kidneys. However, it most often affects a person's lower legs and feet. There are two types of PVD.  Organic PVD. This is the more common type. It is caused by damage to the structure of blood vessels.  Functional PVD. This is caused by conditions that make blood vessels contract and tighten (spasm).  Without treatment, PVD tends to get worse over time. PVD can also lead to acute ischemic limb. This is when an arm or limb suddenly has trouble getting enough blood. This is a medical emergency. What are the causes? Each type of PVD has many different causes. The most common cause of PVD is buildup of a fatty material (plaque) inside of your arteries (atherosclerosis). Small amounts of plaque can break off from the walls of the blood vessels and become lodged in a smaller artery. This blocks blood flow and can cause acute ischemic limb. Other common causes of PVD include:  Blood clots that form inside of blood vessels.  Injuries to blood vessels.  Diseases that cause inflammation of blood vessels or cause blood vessel spasms.  Health behaviors and health history that increase your risk of developing PVD.  What increases the risk? You may have a greater risk of PVD if you:  Have a family history of PVD.  Have certain medical conditions, including: ? High cholesterol. ? Diabetes. ? High blood pressure (hypertension). ? Coronary heart disease. ? Past problems with blood clots. ? Past injury, such as burns or a broken bone. These may have damaged blood vessels in your limbs. ? Buerger disease. This is  caused by inflamed blood vessels in your hands and feet. ? Some forms of arthritis. ? Rare birth defects that affect the arteries in your legs.  Use tobacco.  Do not get enough exercise.  Are obese.  Are age 50 or older.  What are the signs or symptoms? PVD may cause many different symptoms. Your symptoms depend on what part of your body is not getting enough blood. Some common signs and symptoms include:  Cramps in your lower legs. This may be a symptom of poor leg circulation (claudication).  Pain and weakness in your legs while you are physically active that goes away when you rest (intermittent claudication).  Leg pain when at rest.  Leg numbness, tingling, or weakness.  Coldness in a leg or foot, especially when compared with the other leg.  Skin or hair changes. These can include: ? Hair loss. ? Shiny skin. ? Pale or bluish skin. ? Thick toenails.  Inability to get or maintain an erection (erectile dysfunction).  People with PVD are more prone to developing ulcers and sores on their toes, feet, or legs. These may take longer than normal to heal. How is this diagnosed? Your health care provider may diagnose PVD from your signs and symptoms. The health care provider will also do a physical exam. You may have tests to find out what is causing your PVD and determine its severity. Tests may include:  Blood pressure recordings from your arms and legs and measurements of the strength of your pulses (  pulse volume recordings).  Imaging studies using sound waves to take pictures of the blood flow through your blood vessels (Doppler ultrasound).  Injecting a dye into your blood vessels before having imaging studies using: ? X-rays (angiogram or arteriogram). ? Computer-generated X-rays (CT angiogram). ? A powerful electromagnetic field and a computer (magnetic resonance angiogram or MRA).  How is this treated? Treatment for PVD depends on the cause of your condition and the  severity of your symptoms. It also depends on your age. Underlying causes need to be treated and controlled. These include long-lasting (chronic) conditions, such as diabetes, high cholesterol, and high blood pressure. You may need to first try making lifestyle changes and taking medicines. Surgery may be needed if these do not work. Lifestyle changes may include:  Quitting smoking.  Exercising regularly.  Following a low-fat, low-cholesterol diet.  Medicines may include:  Blood thinners to prevent blood clots.  Medicines to improve blood flow.  Medicines to improve your blood cholesterol levels.  Surgical procedures may include:  A procedure that uses an inflated balloon to open a blocked artery and improve blood flow (angioplasty).  A procedure to put in a tube (stent) to keep a blocked artery open (stent implant).  Surgery to reroute blood flow around a blocked artery (peripheral bypass surgery).  Surgery to remove dead tissue from an infected wound on the affected limb.  Amputation. This is surgical removal of the affected limb. This may be necessary in cases of acute ischemic limb that are not improved through medical or surgical treatments.  Follow these instructions at home:  Take medicines only as directed by your health care provider.  Do not use any tobacco products, including cigarettes, chewing tobacco, or electronic cigarettes. If you need help quitting, ask your health care provider.  Lose weight if you are overweight, and maintain a healthy weight as directed by your health care provider.  Eat a diet that is low in fat and cholesterol. If you need help, ask your health care provider.  Exercise regularly. Ask your health care provider to suggest some good activities for you.  Use compression stockings or other mechanical devices as directed by your health care provider.  Take good care of your feet. ? Wear comfortable shoes that fit well. ? Check your feet  often for any cuts or sores. Contact a health care provider if:  You have cramps in your legs while walking.  You have leg pain when you are at rest.  You have coldness in a leg or foot.  Your skin changes.  You have erectile dysfunction.  You have cuts or sores on your feet that are not healing. Get help right away if:  Your arm or leg turns cold and blue.  Your arms or legs become red, warm, swollen, painful, or numb.  You have chest pain or trouble breathing.  You suddenly have weakness in your face, arm, or leg.  You become very confused or lose the ability to speak.  You suddenly have a very bad headache or lose your vision. This information is not intended to replace advice given to you by your health care provider. Make sure you discuss any questions you have with your health care provider. Document Released: 11/06/2004 Document Revised: 03/06/2016 Document Reviewed: 03/09/2014 Elsevier Interactive Patient Education  2017 [employer]  

## 2018-05-25 NOTE — Assessment & Plan Note (Signed)
3.8 cm.  At this level, no immediate risk of rupture.  This can be rechecked in a couple of years with a CT scan of the chest.

## 2018-05-25 NOTE — Assessment & Plan Note (Signed)
Could be causing some of her extremity symptoms but difficult to discern.

## 2018-05-25 NOTE — Assessment & Plan Note (Signed)
Can contribute to lower extremity swelling. 

## 2018-05-25 NOTE — Assessment & Plan Note (Signed)
Recommend:  The patient has atypical pain symptoms for pure atherosclerotic disease. However, on physical exam there is evidence of mixed venous and arterial disease, given the diminished pulses and the edema associated with venous changes of the legs.  Noninvasive studies including ABI's and venous ultrasound of the legs will be obtained and the patient will follow up with me to review these studies.  The patient should continue walking and begin a more formal exercise program. The patient should continue his antiplatelet therapy and aggressive treatment of the lipid abnormalities.  The patient should begin wearing graduated compression socks 15-20 mmHg strength to control edema.  

## 2018-05-25 NOTE — Progress Notes (Signed)
Patient ID: Sharon [name], female   DOB: June 10, 1946, 72 y.o.   MRN: [id number]  Chief Complaint  Patient presents with  . New Patient (Initial Visit)    ref Julian Hy for Thorasic    HPI Sharon [name] is a 72 y.o. female.  I am asked to see the patient by C. Wicker, NP for evaluation of thoracic aortic aneurysm.  The patient reports no significant aneurysm related symptoms.  She does complain of pain and swelling in her legs.  She has pain with activity and her walking levels have markedly decreased.  As her activity has decreased, her leg swelling and heaviness has markedly worsened.  Just in the last few days, she has begun wearing compression stockings which has helped the pain and swelling.  She denies chest pain or shortness of breath.  She does complain of a lot of muscle cramps and a lot of tiredness in her arms and legs.  The swelling is most prominent the evenings when she has been sitting or standing for long periods of time.  She is also noticing increasing varicosities.   Past Medical History:  Diagnosis Date  . Anxiety   . Arthritis    hands, upper back  . Asthma   . COPD (chronic obstructive pulmonary disease) ([place])   . History of cervical cancer   . Menopausal disorder   . Osteoporosis   . Pneumonia 1960  . Spasm of abdominal muscles of right side    intermittent  . TMJ (dislocation of temporomandibular joint)   . Wears dentures    partial lower    Past Surgical History:  Procedure Laterality Date  . ABDOMINAL HYSTERECTOMY  1970's  . bladder botox  2005  . BLADDER SUSPENSION  2004  . CATARACT EXTRACTION W/PHACO Right 03/09/2018   Procedure: CATARACT EXTRACTION PHACO AND INTRAOCULAR LENS PLACEMENT ([place]) right;  Surgeon: Eulogio Bear, MD;  Location: [place];  Service: Ophthalmology;  Laterality: Right;  CALL CELL 1ST  . CATARACT EXTRACTION W/PHACO Left 04/19/2018   Procedure: CATARACT EXTRACTION PHACO AND INTRAOCULAR LENS PLACEMENT ([facility])  LEFT;   Surgeon: Eulogio Bear, MD;  Location: [place];  Service: Ophthalmology;  Laterality: Left;  . COLONOSCOPY WITH PROPOFOL N/A 12/13/2015   Procedure: COLONOSCOPY WITH PROPOFOL;  Surgeon: Lucilla Lame, MD;  Location: [facility] [facility];  Service: Endoscopy;  Laterality: N/A;  . LOOP RECORDER INSERTION N/A 01/07/2018   Procedure: LOOP RECORDER INSERTION;  Surgeon: Deboraha Sprang, MD;  Location: [facility];  Service: Cardiovascular;  Laterality: N/A;  . POLYPECTOMY N/A 12/13/2015   Procedure: POLYPECTOMY;  Surgeon: Lucilla Lame, MD;  Location: [place];  Service: Endoscopy;  Laterality: N/A;  SIGMOID COLON POLYPS X  5  . SHOULDER ARTHROSCOPY W/ ROTATOR CUFF REPAIR Right 1998  . TEE WITHOUT CARDIOVERSION N/A 01/06/2018   Procedure: TRANSESOPHAGEAL ECHOCARDIOGRAM (TEE);  Surgeon: Minna Merritts, MD;  Location: [facility];  Service: Cardiovascular;  Laterality: N/A;  . TONSILLECTOMY AND ADENOIDECTOMY      Family History  Problem Relation Age of Onset  . Diabetes Mother   . Heart disease Mother   . Stroke Mother   . Stroke Maternal Grandmother   No bleeding or clotting disorders  Social History Social History   Tobacco Use  . Smoking status: Former Smoker    Packs/day: 0.60    Years: 56.00    Pack years: 33.60    Types: Cigarettes    Last attempt to  quit: 07/14/2016    Years since quitting: 1.8  . Smokeless tobacco: Never Used  . Tobacco comment: has smoked off and on  Substance Use Topics  . Alcohol use: Yes    Alcohol/week: 0.0 standard drinks    Comment: occasionally - special occasions  . Drug use: No    Allergies  Allergen Reactions  . Baclofen Swelling  . Librium [Chlordiazepoxide] Itching    Dizziness   . Bactrim [Sulfamethoxazole-Trimethoprim] Nausea And Vomiting  . Ibuprofen Rash    Mouth swelling  . Latex Rash    Some bandaids, some gloves; BLOOD TEST NEGATIVE  . Naprosyn [Naproxen] Rash    Mouth swelling  . Other Rash    [place]  nuts - mouth swelling    Current Outpatient Medications  Medication Sig Dispense Refill  . albuterol (PROAIR HFA) 108 (90 Base) MCG/ACT inhaler Inhale 2 puffs into the lungs every 6 (six) hours as needed for wheezing or shortness of breath. 8.5 Inhaler 12  . aspirin EC 81 MG tablet Take 81 mg by mouth every other day.     Marland Kitchen atorvastatin (LIPITOR) 40 MG tablet Take 1 tablet (40 mg total) by mouth daily at 6 PM. 30 tablet 3  . b complex vitamins capsule Take 1 capsule by mouth daily.    . budesonide-formoterol (SYMBICORT) 160-4.5 MCG/ACT inhaler Inhale 2 puffs into the lungs 2 (two) times daily.    . Cholecalciferol (VITAMIN D) 2000 units tablet Take 2,000 Units by mouth daily.    . Cyanocobalamin (B-12 PO) Take 1 tablet by mouth daily.    . Cyanocobalamin 1000 MCG/ML KIT Inject 1,000 mcg as directed every 30 (thirty) days.    Marland Kitchen estradiol (CLIMARA - DOSED IN MG/24 HR) 0.1 mg/24hr patch Place 0.1 mg onto the skin 2 (two) times a week.    . estradiol (VIVELLE-DOT) 0.1 MG/24HR patch APPLY 1 PATCH ONTO THE SKIN 2 TIMES A WEEK 8 patch 11  . guaiFENesin (MUCINEX) 600 MG 12 hr tablet Take 1 tablet (600 mg total) by mouth 2 (two) times daily. 30 tablet 0  . loratadine (CLARITIN) 10 MG tablet Take 10 mg by mouth daily.    . Multiple Vitamin (MULTIVITAMIN) tablet Take 2 tablets by mouth daily.     . pantoprazole (PROTONIX) 40 MG tablet Take 1 tablet (40 mg total) by mouth daily. 90 tablet 3  . SUPER GARLIC PO Take 2 tablets by mouth daily.    . SYMBICORT 160-4.5 MCG/ACT inhaler USE 2 PUFFS TWICE DAILY 10.2 Inhaler 11  . Tiotropium Bromide Monohydrate (SPIRIVA RESPIMAT) 2.5 MCG/ACT AERS Inhale 2 puffs into the lungs daily. 1 Inhaler 12  . citalopram (CELEXA) 20 MG tablet Take 1 tablet (20 mg total) by mouth daily. (Patient not taking: Reported on 03/03/2018) 30 tablet 3   Current Facility-Administered Medications  Medication Dose Route Frequency Provider Last Rate Last Dose  . cyanocobalamin ((VITAMIN  B-12)) injection 1,000 mcg  1,000 mcg Intramuscular Q30 days Kathrine Haddock, NP   1,000 mcg at 05/18/18 1340      REVIEW OF SYSTEMS (Negative unless checked)  Constitutional: _0 Weight loss  _1 Fever  _2 Chills Cardiac: _3 Chest pain   _4 Chest pressure   _5 Palpitations   _6 Shortness of breath when laying flat   _7 Shortness of breath at rest   _8 Shortness of breath with exertion. Vascular:  _9 Pain in legs with walking   _10 Pain in legs at rest   _11 Pain in legs when laying flat   _12 Claudication   _13 Pain in feet when  walking  _0 Pain in feet at rest  _1 Pain in feet when laying flat   _2 History of DVT   _3 Phlebitis   _4 Swelling in legs   _5 Varicose veins   _6 Non-healing ulcers Pulmonary:   _7 Uses home oxygen   _8 Productive cough   _9 Hemoptysis   _10 Wheeze  _11 COPD   _12 Asthma Neurologic:  _13 Dizziness  _14 Blackouts   _15 Seizures   _16 History of stroke   _17 History of TIA  _18 Aphasia   _19 Temporary blindness   _20 Dysphagia   _21 Weakness or numbness in arms   _22 Weakness or numbness in legs Musculoskeletal:  _23 Arthritis   _24 Joint swelling   _25 Joint pain   _26 Low back pain Hematologic:  _27 Easy bruising  _28 Easy bleeding   _29 Hypercoagulable state   _30 Anemic  _31 Hepatitis Gastrointestinal:  _32 Blood in stool   _33 Vomiting blood  _34 Gastroesophageal reflux/heartburn   _35 Abdominal pain Genitourinary:  _36 Chronic kidney disease   _37 Difficult urination  _38 Frequent urination  _39 Burning with urination   _40 Hematuria Skin:  _41 Rashes   _42 Ulcers   _43 Wounds Psychological:  _44 History of anxiety   _45  History of major depression.    Physical Exam BP 139/82 (BP Location: Right Arm)   Pulse 98   Resp 17   Ht _46  (1.753 m)   Wt 205 lb (93 kg)   LMP  (LMP Unknown)   BMI 30.27 kg/m  Gen:  WD/WN, NAD Head: [place]/AT, No temporalis wasting Ear/Nose/Throat: Hearing grossly intact, nares w/o erythema or drainage, oropharynx w/o Erythema/Exudate Eyes: Conjunctiva clear, sclera non-icteric  Neck: trachea midline.  No JVD.    Pulmonary:  Good air movement, respirations not labored, no use of accessory muscles Cardiac: RRR Vascular:  Vessel Right Left  Radial Palpable Palpable                          PT  trace palpable  trace palpable  DP Palpable  1+ palpable   Gastrointestinal: soft, non-tender/non-distended.  Musculoskeletal: M/S 5/5 throughout.  Extremities without ischemic changes.  No deformity or atrophy.  Moderate varicosities throughout both lower extremities measuring 1 to 2 mm in maximal diameter.  1+ bilateral lower extremity edema. Neurologic: Sensation grossly intact in extremities.  Symmetrical.  Speech is fluent. Motor exam as listed above. Psychiatric: Judgment intact, Mood & affect appropriate for pt's clinical situation. Dermatologic: No rashes or ulcers noted.  No cellulitis or open wounds.    Radiology US Abdomen Complete  Result Date: 05/18/2018 CLINICAL DATA:  Abdominal distension, weight gain for 6 months, history asthma, COPD EXAM: ABDOMEN ULTRASOUND COMPLETE COMPARISON:  Gallbladder ultrasound 09/12/2011 FINDINGS: Gallbladder: Normally distended without stones or wall thickening. No pericholecystic fluid or sonographic Murphy sign. Common bile duct: Diameter: 5 mm diameter, normal Liver: Upper normal echogenicity. Suboptimal hepatic visualization due to body habitus and sound attenuation. No gross mass lesion. Portal vein is patent on color Doppler imaging with normal direction of blood flow towards the liver. IVC: Normal appearance Pancreas: Portions of pancreatic head to proximal tail normal appearance with remainder of head and tail obscured by bowel gas Spleen: Normal size and morphology, 5.8 cm length Right Kidney: Length: 9.9 cm. Normal morphology without mass or hydronephrosis. Left Kidney: Length: 10.6 cm. Normal morphology without mass or hydronephrosis. Abdominal aorta: Normal caliber Other findings: No free fluid IMPRESSION: Suboptimal assessment of pancreas and liver as  above. No acute abnormalities. Electronically Signed   By: Lavonia Dana M.D.   On: 05/18/2018 16:09    Labs Recent Results (from the past 2160  hour(s))  Vitamin B12     Status: None   Collection Time: 03/19/18  2:34 PM  Result Value Ref Range   Vitamin B-12 881 232 - 1,245 pg/mL  CUP PACEART REMOTE DEVICE CHECK     Status: None   Collection Time: 04/13/18  1:41 PM  Result Value Ref Range   Date Time Interrogation Session [id number]    Pulse Generator Manufacturer MERM    Pulse Gen Model DDU20 Reveal LINQ    Pulse Gen Serial Number [id number]    Clinic Name [facility]    Implantable Pulse Generator Type ICM/ILR    Implantable Pulse Generator Implant Date [id number]    Eval Rhythm SR   Comprehensive metabolic panel     Status: Abnormal   Collection Time: 05/04/18  3:55 PM  Result Value Ref Range   Glucose 101 (H) 65 - 99 mg/dL   BUN 18 8 - 27 mg/dL   Creatinine, Ser 1.13 (H) 0.57 - 1.00 mg/dL   GFR calc non Af Amer 49 (L) >59 mL/min/1.73   GFR calc Af Amer 56 (L) >59 mL/min/1.73   BUN/Creatinine Ratio 16 12 - 28   Sodium 146 (H) 134 - 144 mmol/L   Potassium 4.2 3.5 - 5.2 mmol/L   Chloride 105 96 - 106 mmol/L   CO2 26 20 - 29 mmol/L   Calcium 9.5 8.7 - 10.3 mg/dL   Total Protein 6.8 6.0 - 8.5 g/dL   Albumin 4.2 3.5 - 4.8 g/dL   Globulin, Total 2.6 1.5 - 4.5 g/dL   Albumin/Globulin Ratio 1.6 1.2 - 2.2   Bilirubin Total 0.4 0.0 - 1.2 mg/dL   Alkaline Phosphatase 81 39 - 117 IU/L   AST 24 0 - 40 IU/L   ALT 30 0 - 32 IU/L  TSH     Status: None   Collection Time: 05/04/18  3:55 PM  Result Value Ref Range   TSH 1.270 0.450 - 4.500 uIU/mL  CBC with Differential/Platelet     Status: None   Collection Time: 05/04/18  3:55 PM  Result Value Ref Range   WBC 8.5 3.4 - 10.8 x10E3/uL   RBC 4.74 3.77 - 5.28 x10E6/uL   Hemoglobin 14.4 11.1 - 15.9 g/dL   Hematocrit 44.3 34.0 - 46.6 %   MCV 94 79 - 97 fL   MCH 30.4 26.6 - 33.0 pg   MCHC 32.5 31.5 - 35.7 g/dL   RDW 12.3 12.3  - 15.4 %   Platelets 250 150 - 450 x10E3/uL   Neutrophils 74 Not Estab. %   Lymphs 16 Not Estab. %   Monocytes 9 Not Estab. %   Eos 1 Not Estab. %   Basos 0 Not Estab. %   Neutrophils Absolute 6.2 1.4 - 7.0 x10E3/uL   Lymphocytes Absolute 1.4 0.7 - 3.1 x10E3/uL   Monocytes Absolute 0.8 0.1 - 0.9 x10E3/uL   EOS (ABSOLUTE) 0.0 0.0 - 0.4 x10E3/uL   Basophils Absolute 0.0 0.0 - 0.2 x10E3/uL   Immature Granulocytes 0 Not Estab. %   Immature Grans (Abs) 0.0 0.0 - 0.1 x10E3/uL  Sed Rate (ESR)     Status: None   Collection Time: 05/04/18  3:55 PM  Result Value Ref Range   Sed Rate 7 0 - 40 mm/hr  VITAMIN D 25 Hydroxy (Vit-D Deficiency, Fractures)     Status: Abnormal   Collection Time: 05/04/18  3:55 PM  Result Value Ref Range   Vit D, 25-Hydroxy 23.7 (L) 30.0 - 100.0 ng/mL  Comment: Vitamin D deficiency has been defined by the [place] practice guideline as a level of serum 25-OH vitamin D less than 20 ng/mL (1,2). The Endocrine Society went on to further define vitamin D insufficiency as a level between 21 and 29 ng/mL (2). 1. IOM (Institute of Medicine). 2010. Dietary reference    intakes for calcium and D. [place]: The    [employer]. 2. Holick MF, Binkley [place], Bischoff-Ferrari HA, et al.    Evaluation, treatment, and prevention of vitamin D    deficiency: an Endocrine Society clinical practice    guideline. JCEM. 2011 Jul; 96(7):1911-30.     Assessment/Plan:  Aneurysm of descending thoracic aorta (HCC) 3.8 cm.  At this level, no immediate risk of rupture.  This can be rechecked in a couple of years with a CT scan of the chest.  Cerebrovascular accident (CVA) due to embolism of precerebral artery ([place]) Could be causing some of her extremity symptoms but difficult to discern.  Chronic kidney disease, stage 3 (HCC) Can contribute to lower extremity swelling  Swelling of limb Likely multifactorial.  Venous reflux  study as below.  Continue compression stockings and elevation.  Pain in limb  Recommend:  The patient has atypical pain symptoms for pure atherosclerotic disease. However, on physical exam there is evidence of mixed venous and arterial disease, given the diminished pulses and the edema associated with venous changes of the legs.  Noninvasive studies including ABI's and venous ultrasound of the legs will be obtained and the patient will follow up with me to review these studies.  The patient should continue walking and begin a more formal exercise program. The patient should continue his antiplatelet therapy and aggressive treatment of the lipid abnormalities.  The patient should begin wearing graduated compression socks 15-20 mmHg strength to control edema.       Leotis Pain 05/25/2018, 3:34 PM   This note was created with Dragon medical transcription system.  Any errors from dictation are unintentional.

## 2018-05-25 NOTE — Assessment & Plan Note (Signed)
Likely multifactorial.  Venous reflux study as below.  Continue compression stockings and elevation.

## 2018-06-07 ENCOUNTER — Ambulatory Visit (INDEPENDENT_AMBULATORY_CARE_PROVIDER_SITE_OTHER): Payer: Medicare Other | Admitting: Physician Assistant

## 2018-06-07 ENCOUNTER — Encounter: Payer: Self-pay | Admitting: Physician Assistant

## 2018-06-07 VITALS — BP 124/66 | HR 98 | Temp 98.2°F

## 2018-06-07 DIAGNOSIS — R490 Dysphonia: Secondary | ICD-10-CM | POA: Diagnosis not present

## 2018-06-07 DIAGNOSIS — R531 Weakness: Secondary | ICD-10-CM

## 2018-06-07 DIAGNOSIS — I6310 Cerebral infarction due to embolism of unspecified precerebral artery: Secondary | ICD-10-CM

## 2018-06-07 NOTE — Progress Notes (Signed)
Subjective:    Patient ID: Sharon [name], female    DOB: 06/02/1946, 72 y.o.   MRN: [id number]  Sharon [name] is a 72 y.o. female presenting on 06/07/2018 for Results (Discuss recent CT results) and Hoarse (Pt had mini stroke in March, voice rasby since then)   HPI   Patient says she doesn't know why she is here, she was made a follow up appointment and she comes to her follow ups. Doesn't recall any CT she needs to discuss. Reports having a stroke in March and her voice being hoarse since then. She is on protonix daily for acid reflux. She denies trouble swallowing. She has an ENT, Dr. Clyde Canterbury but hasn't been to see him in a while. She also reports left sided deficit from stroke that causes difficulty walking and she would like to do physical therapy. She says she hasn't been cleared by her cataract surgeon to drive and that she needs home physical therapy.   Social History   Tobacco Use  . Smoking status: Former Smoker    Packs/day: 0.60    Years: 56.00    Pack years: 33.60    Types: Cigarettes    Last attempt to quit: 07/14/2016    Years since quitting: 1.9  . Smokeless tobacco: Never Used  . Tobacco comment: has smoked off and on  Substance Use Topics  . Alcohol use: Yes    Alcohol/week: 0.0 standard drinks    Comment: occasionally - special occasions  . Drug use: No    Review of Systems Per HPI unless specifically indicated above     Objective:    BP 124/66   Pulse 98   Temp 98.2 F (36.8 C) (Oral)   LMP  (LMP Unknown)   SpO2 98%   Wt Readings from Last 3 Encounters:  05/25/18 205 lb (93 kg)  04/19/18 200 lb (90.7 kg)  04/01/18 195 lb (88.5 kg)    Physical Exam  Constitutional: She is oriented to person, place, and time. She appears well-developed and well-nourished.  HENT:  Mouth/Throat: No oral lesions.  Cardiovascular: Normal rate and regular rhythm.  Pulmonary/Chest: Effort normal and breath sounds normal.  Neurological: She is alert and oriented to  person, place, and time. No cranial nerve deficit. Coordination normal.  Skin: Skin is warm and dry.  Psychiatric: She has a normal mood and affect. Her behavior is normal.   Results for orders placed or performed in visit on 05/04/18  Comprehensive metabolic panel  Result Value Ref Range   Glucose 101 (H) 65 - 99 mg/dL   BUN 18 8 - 27 mg/dL   Creatinine, Ser 1.13 (H) 0.57 - 1.00 mg/dL   GFR calc non Af Amer 49 (L) >59 mL/min/1.73   GFR calc Af Amer 56 (L) >59 mL/min/1.73   BUN/Creatinine Ratio 16 12 - 28   Sodium 146 (H) 134 - 144 mmol/L   Potassium 4.2 3.5 - 5.2 mmol/L   Chloride 105 96 - 106 mmol/L   CO2 26 20 - 29 mmol/L   Calcium 9.5 8.7 - 10.3 mg/dL   Total Protein 6.8 6.0 - 8.5 g/dL   Albumin 4.2 3.5 - 4.8 g/dL   Globulin, Total 2.6 1.5 - 4.5 g/dL   Albumin/Globulin Ratio 1.6 1.2 - 2.2   Bilirubin Total 0.4 0.0 - 1.2 mg/dL   Alkaline Phosphatase 81 39 - 117 IU/L   AST 24 0 - 40 IU/L   ALT 30 0 - 32 IU/L  TSH  Result Value Ref Range   TSH 1.270 0.450 - 4.500 uIU/mL  CBC with Differential/Platelet  Result Value Ref Range   WBC 8.5 3.4 - 10.8 x10E3/uL   RBC 4.74 3.77 - 5.28 x10E6/uL   Hemoglobin 14.4 11.1 - 15.9 g/dL   Hematocrit 44.3 34.0 - 46.6 %   MCV 94 79 - 97 fL   MCH 30.4 26.6 - 33.0 pg   MCHC 32.5 31.5 - 35.7 g/dL   RDW 12.3 12.3 - 15.4 %   Platelets 250 150 - 450 x10E3/uL   Neutrophils 74 Not Estab. %   Lymphs 16 Not Estab. %   Monocytes 9 Not Estab. %   Eos 1 Not Estab. %   Basos 0 Not Estab. %   Neutrophils Absolute 6.2 1.4 - 7.0 x10E3/uL   Lymphocytes Absolute 1.4 0.7 - 3.1 x10E3/uL   Monocytes Absolute 0.8 0.1 - 0.9 x10E3/uL   EOS (ABSOLUTE) 0.0 0.0 - 0.4 x10E3/uL   Basophils Absolute 0.0 0.0 - 0.2 x10E3/uL   Immature Granulocytes 0 Not Estab. %   Immature Grans (Abs) 0.0 0.0 - 0.1 x10E3/uL  Sed Rate (ESR)  Result Value Ref Range   Sed Rate 7 0 - 40 mm/hr  VITAMIN D 25 Hydroxy (Vit-D Deficiency, Fractures)  Result Value Ref Range   Vit D,  25-Hydroxy 23.7 (L) 30.0 - 100.0 ng/mL      Assessment & Plan:  1. Hoarse voice quality  Suggest calling her ENT for appointment, she is already on Protonix.   2. Left-sided weakness  - Ambulatory referral to [facility]  3. Cerebrovascular accident (CVA) due to embolism of precerebral artery ([place])  - Ambulatory referral to [facility]  I have spent 25 minutes with this patient, >50% of which was spent on counseling and coordination of care.  Follow up plan: Return if symptoms worsen or fail to improve.  Carles Collet, PA-C [facility] 06/10/2018, 1:48 PM

## 2018-06-07 NOTE — Patient Instructions (Signed)

## 2018-06-15 ENCOUNTER — Telehealth: Payer: Self-pay | Admitting: Physician Assistant

## 2018-06-15 DIAGNOSIS — I69354 Hemiplegia and hemiparesis following cerebral infarction affecting left non-dominant side: Secondary | ICD-10-CM | POA: Diagnosis not present

## 2018-06-15 DIAGNOSIS — N183 Chronic kidney disease, stage 3 (moderate): Secondary | ICD-10-CM | POA: Diagnosis not present

## 2018-06-15 DIAGNOSIS — N393 Stress incontinence (female) (male): Secondary | ICD-10-CM | POA: Diagnosis not present

## 2018-06-15 DIAGNOSIS — I700 Atherosclerosis of aorta: Secondary | ICD-10-CM | POA: Diagnosis not present

## 2018-06-15 DIAGNOSIS — Z7902 Long term (current) use of antithrombotics/antiplatelets: Secondary | ICD-10-CM | POA: Diagnosis not present

## 2018-06-15 DIAGNOSIS — Z87891 Personal history of nicotine dependence: Secondary | ICD-10-CM | POA: Diagnosis not present

## 2018-06-15 DIAGNOSIS — I712 Thoracic aortic aneurysm, without rupture: Secondary | ICD-10-CM | POA: Diagnosis not present

## 2018-06-15 DIAGNOSIS — J449 Chronic obstructive pulmonary disease, unspecified: Secondary | ICD-10-CM | POA: Diagnosis not present

## 2018-06-15 DIAGNOSIS — F419 Anxiety disorder, unspecified: Secondary | ICD-10-CM | POA: Diagnosis not present

## 2018-06-15 DIAGNOSIS — Z7951 Long term (current) use of inhaled steroids: Secondary | ICD-10-CM | POA: Diagnosis not present

## 2018-06-15 DIAGNOSIS — Z9181 History of falling: Secondary | ICD-10-CM | POA: Diagnosis not present

## 2018-06-15 NOTE — Telephone Encounter (Signed)
Copied from [place] [id number]. Topic: General - Other >> Jun 15, 2018  3:19 PM Cecelia Byars, NT wrote: Reason for CRM: Matt from [facility] called and needs verbal orders for PT for 6 total visits 2 x  week for 3 weeks working on balance endurance ,and strength, and training please call [phone]  ok to leave a message

## 2018-06-16 NOTE — Telephone Encounter (Signed)
Routing to provider  

## 2018-06-16 NOTE — Telephone Encounter (Signed)
Spoke with Catalina Antigua and let him know that Fabio Bering says "Ok to give verbal order for PT"

## 2018-06-16 NOTE — Telephone Encounter (Signed)
OK to give verbal order for PT.

## 2018-06-17 ENCOUNTER — Ambulatory Visit (INDEPENDENT_AMBULATORY_CARE_PROVIDER_SITE_OTHER): Payer: Medicare Other

## 2018-06-17 DIAGNOSIS — Z23 Encounter for immunization: Secondary | ICD-10-CM | POA: Diagnosis not present

## 2018-06-17 DIAGNOSIS — K140 Glossitis: Secondary | ICD-10-CM | POA: Diagnosis not present

## 2018-06-18 ENCOUNTER — Ambulatory Visit (INDEPENDENT_AMBULATORY_CARE_PROVIDER_SITE_OTHER): Payer: Medicare Other | Admitting: *Deleted

## 2018-06-18 DIAGNOSIS — I639 Cerebral infarction, unspecified: Secondary | ICD-10-CM

## 2018-06-18 DIAGNOSIS — K140 Glossitis: Secondary | ICD-10-CM | POA: Diagnosis not present

## 2018-06-18 DIAGNOSIS — Z23 Encounter for immunization: Secondary | ICD-10-CM | POA: Diagnosis not present

## 2018-06-19 NOTE — Progress Notes (Signed)
Carelink Summary Report / Loop Recorder 

## 2018-06-21 DIAGNOSIS — I69354 Hemiplegia and hemiparesis following cerebral infarction affecting left non-dominant side: Secondary | ICD-10-CM | POA: Diagnosis not present

## 2018-06-21 DIAGNOSIS — I712 Thoracic aortic aneurysm, without rupture: Secondary | ICD-10-CM | POA: Diagnosis not present

## 2018-06-21 DIAGNOSIS — F419 Anxiety disorder, unspecified: Secondary | ICD-10-CM | POA: Diagnosis not present

## 2018-06-21 DIAGNOSIS — N393 Stress incontinence (female) (male): Secondary | ICD-10-CM | POA: Diagnosis not present

## 2018-06-21 DIAGNOSIS — I700 Atherosclerosis of aorta: Secondary | ICD-10-CM | POA: Diagnosis not present

## 2018-06-21 DIAGNOSIS — J449 Chronic obstructive pulmonary disease, unspecified: Secondary | ICD-10-CM | POA: Diagnosis not present

## 2018-06-23 DIAGNOSIS — I69354 Hemiplegia and hemiparesis following cerebral infarction affecting left non-dominant side: Secondary | ICD-10-CM | POA: Diagnosis not present

## 2018-06-23 DIAGNOSIS — I700 Atherosclerosis of aorta: Secondary | ICD-10-CM | POA: Diagnosis not present

## 2018-06-23 DIAGNOSIS — F419 Anxiety disorder, unspecified: Secondary | ICD-10-CM | POA: Diagnosis not present

## 2018-06-23 DIAGNOSIS — J449 Chronic obstructive pulmonary disease, unspecified: Secondary | ICD-10-CM | POA: Diagnosis not present

## 2018-06-23 DIAGNOSIS — N393 Stress incontinence (female) (male): Secondary | ICD-10-CM | POA: Diagnosis not present

## 2018-06-23 DIAGNOSIS — I712 Thoracic aortic aneurysm, without rupture: Secondary | ICD-10-CM | POA: Diagnosis not present

## 2018-06-28 DIAGNOSIS — I712 Thoracic aortic aneurysm, without rupture: Secondary | ICD-10-CM | POA: Diagnosis not present

## 2018-06-28 DIAGNOSIS — F419 Anxiety disorder, unspecified: Secondary | ICD-10-CM | POA: Diagnosis not present

## 2018-06-28 DIAGNOSIS — I69354 Hemiplegia and hemiparesis following cerebral infarction affecting left non-dominant side: Secondary | ICD-10-CM | POA: Diagnosis not present

## 2018-06-28 DIAGNOSIS — N393 Stress incontinence (female) (male): Secondary | ICD-10-CM | POA: Diagnosis not present

## 2018-06-28 DIAGNOSIS — J449 Chronic obstructive pulmonary disease, unspecified: Secondary | ICD-10-CM | POA: Diagnosis not present

## 2018-06-28 DIAGNOSIS — I700 Atherosclerosis of aorta: Secondary | ICD-10-CM | POA: Diagnosis not present

## 2018-06-29 ENCOUNTER — Telehealth: Payer: Self-pay

## 2018-06-29 ENCOUNTER — Encounter: Payer: Self-pay | Admitting: Gastroenterology

## 2018-06-29 DIAGNOSIS — K219 Gastro-esophageal reflux disease without esophagitis: Secondary | ICD-10-CM

## 2018-06-29 DIAGNOSIS — Z1231 Encounter for screening mammogram for malignant neoplasm of breast: Secondary | ICD-10-CM | POA: Diagnosis not present

## 2018-06-29 LAB — CUP PACEART REMOTE DEVICE CHECK
Date Time Interrogation Session: 20190804143949
MDC IDC PG IMPLANT DT: 20190328

## 2018-06-29 LAB — HM MAMMOGRAPHY

## 2018-06-29 NOTE — Telephone Encounter (Signed)
Referral generated

## 2018-06-29 NOTE — Telephone Encounter (Signed)
Copied from [place] [id number]. Topic: Referral - Request >> Jun 29, 2018 11:03 AM Alfredia Ferguson R wrote: Pt is calling in for a referral to Dr. Aundria Rud due to acid reflux she states protonix prescribed to her isnt working   Routing to provider for referral.

## 2018-06-30 DIAGNOSIS — N393 Stress incontinence (female) (male): Secondary | ICD-10-CM | POA: Diagnosis not present

## 2018-06-30 DIAGNOSIS — J449 Chronic obstructive pulmonary disease, unspecified: Secondary | ICD-10-CM | POA: Diagnosis not present

## 2018-06-30 DIAGNOSIS — I69354 Hemiplegia and hemiparesis following cerebral infarction affecting left non-dominant side: Secondary | ICD-10-CM | POA: Diagnosis not present

## 2018-06-30 DIAGNOSIS — I712 Thoracic aortic aneurysm, without rupture: Secondary | ICD-10-CM | POA: Diagnosis not present

## 2018-06-30 DIAGNOSIS — F419 Anxiety disorder, unspecified: Secondary | ICD-10-CM | POA: Diagnosis not present

## 2018-06-30 DIAGNOSIS — I700 Atherosclerosis of aorta: Secondary | ICD-10-CM | POA: Diagnosis not present

## 2018-07-05 ENCOUNTER — Encounter: Payer: Self-pay | Admitting: Family Medicine

## 2018-07-05 ENCOUNTER — Ambulatory Visit
Admission: EM | Admit: 2018-07-05 | Discharge: 2018-07-05 | Disposition: A | Discharge status: Home / Self Care | Payer: Medicare Other | Attending: Internal Medicine | Admitting: Internal Medicine

## 2018-07-05 ENCOUNTER — Ambulatory Visit (INDEPENDENT_AMBULATORY_CARE_PROVIDER_SITE_OTHER): Payer: Medicare Other | Admitting: Family Medicine

## 2018-07-05 ENCOUNTER — Encounter: Payer: Self-pay | Admitting: Emergency Medicine

## 2018-07-05 ENCOUNTER — Other Ambulatory Visit: Payer: Self-pay

## 2018-07-05 VITALS — BP 138/80 | HR 94 | Temp 98.4°F | Ht 69.0 in

## 2018-07-05 DIAGNOSIS — I6310 Cerebral infarction due to embolism of unspecified precerebral artery: Secondary | ICD-10-CM

## 2018-07-05 DIAGNOSIS — S50811A Abrasion of right forearm, initial encounter: Secondary | ICD-10-CM | POA: Diagnosis not present

## 2018-07-05 DIAGNOSIS — S8001XA Contusion of right knee, initial encounter: Secondary | ICD-10-CM | POA: Diagnosis not present

## 2018-07-05 DIAGNOSIS — S51811A Laceration without foreign body of right forearm, initial encounter: Secondary | ICD-10-CM

## 2018-07-05 DIAGNOSIS — S80212A Abrasion, left knee, initial encounter: Secondary | ICD-10-CM | POA: Diagnosis not present

## 2018-07-05 DIAGNOSIS — W108XXA Fall (on) (from) other stairs and steps, initial encounter: Secondary | ICD-10-CM

## 2018-07-05 DIAGNOSIS — R600 Localized edema: Secondary | ICD-10-CM

## 2018-07-05 MED ORDER — HYDROCHLOROTHIAZIDE 12.5 MG PO TABS: 12.5000 mg | ORAL_TABLET | Freq: Every day | ORAL | 0 refills | Status: DC

## 2018-07-05 MED ORDER — LIDOCAINE-EPINEPHRINE-TETRACAINE (LET) SOLUTION
3.0000 mL | Freq: Once | NASAL | Status: AC
  Administered 2018-07-05: 3 mL via TOPICAL

## 2018-07-05 NOTE — Progress Notes (Signed)
   BP 138/80   Pulse 94   Temp 98.4 F (36.9 C) (Oral)   Ht 5\' 9"  (1.753 m)   LMP  (LMP Unknown)   SpO2 98%   BMI 30.27 kg/m    Subjective:    Patient ID: Sharon [name], female    DOB: 1946-08-11, 72 y.o.   MRN: [id number]  HPI: Sharon [name] is a 72 y.o. female  Chief Complaint  Patient presents with  . Leg Swelling    bilaterl leg swelling  . Medication Problem    pt states that she has been having red spots come up on skin since starting plavix  . Muscle Pain    pt states she has been getting muscle cramps since starting with Home Health   Here today concerned about b/l LE edema that seems to be worsening. Having dull discomfort and muscle cramps off and on. Scheduled to see vascular specialist Oct. 2nd for this. Wearing compression hose and elevating legs. Also has cut out most of the sodium in diet. Does note much less activity since her CVA  Protonix not helping with her acid reflux so scheduled to see Dr. Allen Norris with GI.   Relevant past medical, surgical, family and social history reviewed and updated as indicated. Interim medical history since our last visit reviewed. Allergies and medications reviewed and updated.  Review of Systems  Per HPI unless specifically indicated above     Objective:    BP 138/80   Pulse 94   Temp 98.4 F (36.9 C) (Oral)   Ht 5\' 9"  (1.753 m)   LMP  (LMP Unknown)   SpO2 98%   BMI 30.27 kg/m   Wt Readings from Last 3 Encounters:  07/05/18 200 lb (90.7 kg)  05/25/18 205 lb (93 kg)  04/19/18 200 lb (90.7 kg)    Physical Exam  Constitutional: She is oriented to person, place, and time. She appears well-developed and well-nourished. No distress.  HENT:  Head: Atraumatic.  Eyes: Conjunctivae and EOM are normal.  Neck: Normal range of motion. Neck supple.  Pulmonary/Chest: Effort normal and breath sounds normal.  Musculoskeletal: Normal range of motion. She exhibits edema (b/l LE edema, left worse than right) and tenderness (mildly  ttp diffusely).  Neurological: She is alert and oriented to person, place, and time.  Skin: Skin is warm and dry.  Bruising all down arms  Psychiatric: She has a normal mood and affect. Her behavior is normal.  Nursing note and vitals reviewed.   Results for orders placed or performed in visit on 07/02/18  HM MAMMOGRAPHY  Result Value Ref Range   HM Mammogram 0-4 Bi-Rad 0-4 Bi-Rad, Self Reported Normal      Assessment & Plan:   Problem List Items Addressed This Visit    None    Visit Diagnoses    Bilateral leg edema    -  Primary   Will add low dose HCTZ and continue elevation, compression. Increase exercise. Follow up as scheduled with vascular and in 2 weeks for BP check, BMP       Follow up plan: Return in about 2 weeks (around 07/19/2018) for LE edema.

## 2018-07-05 NOTE — ED Triage Notes (Signed)
Patient c/o fall today on her porch, she slipped on a rug outside and fell hitting both knees, and right arm. Patient is on Plavix and is concerned so she came in for evaluation.

## 2018-07-05 NOTE — Discharge Instructions (Addendum)
Wounds washed at urgent care today, and Restore dressing applied.  Leave dressing on for 5 days or until it softens and slides off wound.  At dressing changes, wash wound gently with soap/water and apply a Restore or similar several-day type bandage if possible; antibiotic ointment and a large bandaid will also work.  Anticipate gradual healing over the next couple weeks.  Recheck for any increasing redness/swelling/drainage/pain at abscess site or new fever >100.5.  Your last tetanus shot was in 09/2014.

## 2018-07-05 NOTE — ED Provider Notes (Signed)
[facility]    CSN: [id number] Arrival date & time: 07/05/18  1810     History   Chief Complaint Chief Complaint  Patient presents with  . Fall    HPI Sharon [name] is a 72 y.o. female.   She had a stroke in March of this year, continues to have a little bit of difficulty with her balance.  She presented today after falling going up her deck steps.  She landed on both knees, and skinned her right forearm.  Did not hit her head.  Was able to get herself up independently. Last tetanus 09/2014.    HPI  Past Medical History:  Diagnosis Date  . Anxiety   . Arthritis    hands, upper back  . Asthma   . COPD (chronic obstructive pulmonary disease) ([place])   . History of cervical cancer   . Menopausal disorder   . Osteoporosis   . Pneumonia 1960  . Spasm of abdominal muscles of right side    intermittent  . TMJ (dislocation of temporomandibular joint)   . Wears dentures    partial lower    Patient Active Problem List   Diagnosis Date Noted  . Swelling of limb 05/25/2018  . Pain in limb 05/25/2018  . Aneurysm of descending thoracic aorta (HCC) 05/04/2018  . Allergic rhinitis 02/16/2018  . Bruising 02/16/2018  . Superficial bruising of arm, right, initial encounter 02/01/2018  . Muscle spasm 02/01/2018  . Aortic atherosclerosis ([place]) 01/13/2018  . Status post placement of implantable loop recorder 01/12/2018  . Left-sided weakness 01/12/2018  . Chronic kidney disease, stage 3 ([place]) 01/12/2018  . Cerebrovascular accident (CVA) due to embolism of precerebral artery ([place])   . TIA (transient ischemic attack) 01/04/2018  . Osteoarthritis of wrist 12/09/2017  . Piriformis syndrome, left 11/25/2017  . CAD (coronary artery disease) 11/25/2017  . Caregiver stress 11/25/2017  . Menopause 08/14/2017  . Abnormal weight gain 08/14/2017  . De Quervain's tenosynovitis, right 07/17/2017  . Stress incontinence 06/19/2017  . Personal history of tobacco use, presenting  hazards to health 04/01/2017  . Anxiety 03/13/2017  . Glossitis 11/14/2016  . Advanced care planning/counseling discussion 10/17/2016  . Other fecal abnormalities   . Benign neoplasm of sigmoid colon   . COPD (chronic obstructive pulmonary disease) ([place]) 10/17/2015  . Mass of upper inner quadrant of right breast 06/22/2015    Past Surgical History:  Procedure Laterality Date  . ABDOMINAL HYSTERECTOMY  1970's  . bladder botox  2005  . BLADDER SUSPENSION  2004  . CATARACT EXTRACTION W/PHACO Right 03/09/2018   Procedure: CATARACT EXTRACTION PHACO AND INTRAOCULAR LENS PLACEMENT ([place]) right;  Surgeon: Eulogio Bear, MD;  Location: [place];  Service: Ophthalmology;  Laterality: Right;  CALL CELL 1ST  . CATARACT EXTRACTION W/PHACO Left 04/19/2018   Procedure: CATARACT EXTRACTION PHACO AND INTRAOCULAR LENS PLACEMENT (IOC)  LEFT;  Surgeon: Eulogio Bear, MD;  Location: [place];  Service: Ophthalmology;  Laterality: Left;  . COLONOSCOPY WITH PROPOFOL N/A 12/13/2015   Procedure: COLONOSCOPY WITH PROPOFOL;  Surgeon: Lucilla Lame, MD;  Location: [place];  Service: Endoscopy;  Laterality: N/A;  . LOOP RECORDER INSERTION N/A 01/07/2018   Procedure: LOOP RECORDER INSERTION;  Surgeon: Deboraha Sprang, MD;  Location: [place] [facility];  Service: Cardiovascular;  Laterality: N/A;  . POLYPECTOMY N/A 12/13/2015   Procedure: POLYPECTOMY;  Surgeon: Lucilla Lame, MD;  Location: [place];  Service: Endoscopy;  Laterality: N/A;  SIGMOID  COLON POLYPS X  5  . SHOULDER ARTHROSCOPY W/ ROTATOR CUFF REPAIR Right 1998  . TEE WITHOUT CARDIOVERSION N/A 01/06/2018   Procedure: TRANSESOPHAGEAL ECHOCARDIOGRAM (TEE);  Surgeon: Minna Merritts, MD;  Location: [facility];  Service: Cardiovascular;  Laterality: N/A;  . TONSILLECTOMY AND ADENOIDECTOMY      OB History    Gravida  4   Para  1   Term      Preterm      AB  3   Living        SAB  3   TAB      Ectopic       Multiple      Live Births           Obstetric Comments  1st Menstrual Cycle:  ?  1st Pregnancy:  22         Home Medications    Prior to Admission medications   Medication Sig Start Date End Date Taking? Authorizing Provider  albuterol (PROAIR HFA) 108 (90 Base) MCG/ACT inhaler Inhale 2 puffs into the lungs every 6 (six) hours as needed for wheezing or shortness of breath. 07/10/17  Yes Kathrine Haddock, NP  aspirin EC 81 MG tablet Take 81 mg by mouth every other day.    Yes [provider]  b complex vitamins capsule Take 1 capsule by mouth daily.   Yes [provider]  Cholecalciferol (VITAMIN D) 2000 units tablet Take 2,000 Units by mouth daily.   Yes [provider]  citalopram (CELEXA) 20 MG tablet Take 1 tablet (20 mg total) by mouth daily. 01/12/18  Yes Kathrine Haddock, NP  clopidogrel (PLAVIX) 75 MG tablet Take 1 tablet by mouth daily. 06/07/18  Yes [provider]  Cyanocobalamin 1000 MCG/ML KIT Inject 1,000 mcg as directed every 30 (thirty) days.   Yes [provider]  estradiol (VIVELLE-DOT) 0.1 MG/24HR patch APPLY 1 PATCH ONTO THE SKIN 2 TIMES A WEEK 02/01/18  Yes Kathrine Haddock, NP  hydrochlorothiazide (HYDRODIURIL) 12.5 MG tablet Take 1 tablet (12.5 mg total) by mouth daily. 07/05/18  Yes Volney American, PA-C  loratadine (CLARITIN) 10 MG tablet Take 10 mg by mouth daily. 02/24/18  Yes [provider]  pantoprazole (PROTONIX) 40 MG tablet Take 1 tablet (40 mg total) by mouth daily. 02/10/18  Yes Kathrine Haddock, NP  SYMBICORT 160-4.5 MCG/ACT inhaler USE 2 PUFFS TWICE DAILY 04/30/18  Yes Kathrine Haddock, NP  Tiotropium Bromide Monohydrate (SPIRIVA RESPIMAT) 2.5 MCG/ACT AERS Inhale 2 puffs into the lungs daily. 11/25/17  Yes Kathrine Haddock, NP  atorvastatin (LIPITOR) 40 MG tablet TAKE 1 TABLET BY MOUTH ONCE A DAY AT 6 PM 07/06/18   Volney American, PA-C  clopidogrel (PLAVIX) 75 MG tablet TAKE 1 TABLET BY MOUTH ONCE  A DAY 07/06/18   Volney American, PA-C  Multiple Vitamin (MULTIVITAMIN) tablet Take 2 tablets by mouth daily.     [provider]  SUPER GARLIC PO Take 2 tablets by mouth daily.    [provider]    Family History Family History  Problem Relation Age of Onset  . Diabetes Mother   . Heart disease Mother   . Stroke Mother   . Stroke Maternal Grandmother     Social History Social History   Tobacco Use  . Smoking status: Former Smoker    Packs/day: 0.60    Years: 56.00    Pack years: 33.60    Types: Cigarettes    Last attempt to  quit: 07/14/2016    Years since quitting: 1.9  . Smokeless tobacco: Never Used  . Tobacco comment: has smoked off and on  Substance Use Topics  . Alcohol use: Yes    Alcohol/week: 0.0 standard drinks    Comment: occasionally - special occasions  . Drug use: No     Allergies   Baclofen; Librium [chlordiazepoxide]; Bactrim [sulfamethoxazole-trimethoprim]; Ibuprofen; Latex; Naprosyn [naproxen]; and Other   Review of Systems Review of Systems  All other systems reviewed and are negative.    Physical Exam Triage Vital Signs ED Triage Vitals  Enc Vitals Group     BP 07/05/18 1836 (!) 141/79     Pulse Rate 07/05/18 1836 100     Resp 07/05/18 1836 18     Temp 07/05/18 1836 98.4 F (36.9 C)     Temp Source 07/05/18 1836 Oral     SpO2 07/05/18 1836 98 %     Weight 07/05/18 1837 200 lb (90.7 kg)     Height 07/05/18 1837 5' 9"  (1.753 m)     Pain Score 07/05/18 1837 5     Pain Loc --    Updated Vital Signs BP (!) 141/79 (BP Location: Right Arm)   Pulse 100   Temp 98.4 F (36.9 C) (Oral)   Resp 18   Ht 5' 9"  (1.753 m)   Wt 90.7 kg   LMP  (LMP Unknown)   SpO2 98%   BMI 29.53 kg/m  Physical Exam  Constitutional: She is oriented to person, place, and time. No distress.  HENT:  Head: Atraumatic.  Eyes:  Conjugate gaze observed, no eye redness/discharge  Neck: Neck supple.  Cardiovascular: Normal rate.    Pulmonary/Chest: No respiratory distress.  Abdominal: She exhibits no distension.  Musculoskeletal: Normal range of motion.  Neurological: She is alert and oriented to person, place, and time.  Skin: Skin is warm and dry.  1.5 inch moderately deep abrasion just below the left knee 0.75 inch flap type skin tear ventral aspect of the right mid forearm Right knee is a little puffy superiorly, but no focal tenderness, no frank hematoma good range of motion of both knees.  Patient was able to walk into the urgent care independently Scattered purpuric lesions over both forearms, patient says these are chronic  Nursing note and vitals reviewed.    UC Treatments / Results   Procedures Procedures (including critical care time)  Medications Ordered in UC Medications  lidocaine-EPINEPHrine-tetracaine (LET) solution (3 mLs Topical Given 07/05/18 1949)  wounds cleaned per nursing, dressed with ReStore bandages.   Final Clinical Impressions(s) / UC Diagnoses   Final diagnoses:  Abrasion, left knee, initial encounter  Skin tear of right forearm without complication, initial encounter  Contusion of right knee, initial encounter     Discharge Instructions     Wounds washed at urgent care today, and Restore dressing applied.  Leave dressing on for 5 days or until it softens and slides off wound.  At dressing changes, wash wound gently with soap/water and apply a Restore or similar several-day type bandage if possible; antibiotic ointment and a large bandaid will also work.  Anticipate gradual healing over the next couple weeks.  Recheck for any increasing redness/swelling/drainage/pain at abscess site or new fever >100.5.  Your last tetanus shot was in 09/2014.         Wynona Luna, MD 07/08/18 1520

## 2018-07-06 ENCOUNTER — Other Ambulatory Visit: Payer: Self-pay | Admitting: Family Medicine

## 2018-07-06 DIAGNOSIS — F419 Anxiety disorder, unspecified: Secondary | ICD-10-CM | POA: Diagnosis not present

## 2018-07-06 DIAGNOSIS — I69354 Hemiplegia and hemiparesis following cerebral infarction affecting left non-dominant side: Secondary | ICD-10-CM | POA: Diagnosis not present

## 2018-07-06 DIAGNOSIS — N393 Stress incontinence (female) (male): Secondary | ICD-10-CM | POA: Diagnosis not present

## 2018-07-06 DIAGNOSIS — J449 Chronic obstructive pulmonary disease, unspecified: Secondary | ICD-10-CM | POA: Diagnosis not present

## 2018-07-06 DIAGNOSIS — I700 Atherosclerosis of aorta: Secondary | ICD-10-CM | POA: Diagnosis not present

## 2018-07-06 DIAGNOSIS — I712 Thoracic aortic aneurysm, without rupture: Secondary | ICD-10-CM | POA: Diagnosis not present

## 2018-07-08 NOTE — Patient Instructions (Signed)
Follow-up in 2 weeks

## 2018-07-09 LAB — CUP PACEART REMOTE DEVICE CHECK
MDC IDC PG IMPLANT DT: 20190328
MDC IDC SESS DTM: 20190906153622

## 2018-07-13 DIAGNOSIS — I712 Thoracic aortic aneurysm, without rupture: Secondary | ICD-10-CM | POA: Diagnosis not present

## 2018-07-13 DIAGNOSIS — J449 Chronic obstructive pulmonary disease, unspecified: Secondary | ICD-10-CM | POA: Diagnosis not present

## 2018-07-13 DIAGNOSIS — I700 Atherosclerosis of aorta: Secondary | ICD-10-CM | POA: Diagnosis not present

## 2018-07-13 DIAGNOSIS — I69354 Hemiplegia and hemiparesis following cerebral infarction affecting left non-dominant side: Secondary | ICD-10-CM | POA: Diagnosis not present

## 2018-07-13 DIAGNOSIS — F419 Anxiety disorder, unspecified: Secondary | ICD-10-CM | POA: Diagnosis not present

## 2018-07-13 DIAGNOSIS — N393 Stress incontinence (female) (male): Secondary | ICD-10-CM | POA: Diagnosis not present

## 2018-07-14 ENCOUNTER — Encounter (INDEPENDENT_AMBULATORY_CARE_PROVIDER_SITE_OTHER): Payer: Self-pay | Admitting: Vascular Surgery

## 2018-07-14 ENCOUNTER — Ambulatory Visit (INDEPENDENT_AMBULATORY_CARE_PROVIDER_SITE_OTHER): Payer: Medicare Other

## 2018-07-14 ENCOUNTER — Ambulatory Visit (INDEPENDENT_AMBULATORY_CARE_PROVIDER_SITE_OTHER): Payer: Medicare Other | Admitting: Vascular Surgery

## 2018-07-14 DIAGNOSIS — M79604 Pain in right leg: Secondary | ICD-10-CM

## 2018-07-14 DIAGNOSIS — M79605 Pain in left leg: Secondary | ICD-10-CM

## 2018-07-14 DIAGNOSIS — R600 Localized edema: Secondary | ICD-10-CM | POA: Diagnosis not present

## 2018-07-14 DIAGNOSIS — I700 Atherosclerosis of aorta: Secondary | ICD-10-CM | POA: Diagnosis not present

## 2018-07-14 DIAGNOSIS — M7989 Other specified soft tissue disorders: Secondary | ICD-10-CM

## 2018-07-14 DIAGNOSIS — I890 Lymphedema, not elsewhere classified: Secondary | ICD-10-CM | POA: Insufficient documentation

## 2018-07-14 DIAGNOSIS — F17211 Nicotine dependence, cigarettes, in remission: Secondary | ICD-10-CM | POA: Diagnosis not present

## 2018-07-14 DIAGNOSIS — I872 Venous insufficiency (chronic) (peripheral): Secondary | ICD-10-CM | POA: Diagnosis not present

## 2018-07-14 DIAGNOSIS — I6310 Cerebral infarction due to embolism of unspecified precerebral artery: Secondary | ICD-10-CM

## 2018-07-14 NOTE — Progress Notes (Signed)
Subjective:    Patient ID: Sharon [name], female    DOB: 03-07-1946, 72 y.o.   MRN: [id number] Chief Complaint  Patient presents with  . Follow-up    Bilateral Reflux and ABI follow up   Patient presents to review vascular studies.  The patient was last seen on May 25, 2018 and evaluation of bilateral lower extremity edema and discomfort.  Since her initial visit, the patient has started to engage in conservative therapy including wearing medical grade 1 compression socks, elevating her legs and remaining active.  The patient has noticed a minimal improvement in her edema and the discomfort associated with her edema.  The patient denies any claudication-like symptoms, rest pain or ulcer formation to the bilateral legs.  The patient denies any worsening in the amount of edema she experiences.  The patient denies any recurrent bouts of cellulitis.  The patient underwent a bilateral ABI which was notable for right: 1.01, triphasic tibials / left: 1.02, biphasic tibials.  The patient underwent a bilateral lower extremity venous duplex which was notable for no evidence of deep vein thrombosis or superficial thrombophlebitis of the bilateral lower extremity.  There is no evidence of chronic venous insufficiency to the right lower extremity.  There was chronic venous insufficiency noted in the left common femoral vein.  The patient denies any fever, nausea vomiting.  Review of Systems  Constitutional: Negative.   HENT: Negative.   Eyes: Negative.   Respiratory: Negative.   Cardiovascular: Positive for leg swelling.  Gastrointestinal: Negative.   Endocrine: Negative.   Genitourinary: Negative.   Musculoskeletal: Negative.   Skin: Negative.   Allergic/Immunologic: Negative.   Neurological: Negative.   Hematological: Negative.   Psychiatric/Behavioral: Negative.       Objective:   Physical Exam  Constitutional: She is oriented to person, place, and time. She appears well-developed and  well-nourished. No distress.  HENT:  Head: Normocephalic and atraumatic.  Right Ear: External ear normal.  Left Ear: External ear normal.  Eyes: Pupils are equal, round, and reactive to light. Conjunctivae and EOM are normal.  Neck: Normal range of motion.  Cardiovascular: Normal rate, regular rhythm, normal heart sounds and intact distal pulses.  Pulses:      Radial pulses are 2+ on the right side, and 2+ on the left side.  Hard to palpate pedal pulses however the bilateral feet are warm.  Good capillary refill.  Pulmonary/Chest: Effort normal and breath sounds normal.  Musculoskeletal: Normal range of motion. She exhibits edema (Mild to moderate nonpitting edema noted bilaterally).  Neurological: She is alert and oriented to person, place, and time.  Skin: She is not diaphoretic.  Diffuse less than 1 cm varicosities noted to the bilateral lower extremity.  Mild stasis dermatitis noted to the bilateral calves.  There is no fibrosis, cellulitis or active ulcerations noted at this time.  Psychiatric: She has a normal mood and affect. Her behavior is normal. Judgment and thought content normal.  Vitals reviewed.  LMP  (LMP Unknown)   Past Medical History:  Diagnosis Date  . Anxiety   . Arthritis    hands, upper back  . Asthma   . COPD (chronic obstructive pulmonary disease) ([place])   . History of cervical cancer   . Menopausal disorder   . Osteoporosis   . Pneumonia 1960  . Spasm of abdominal muscles of right side    intermittent  . TMJ (dislocation of temporomandibular joint)   . Wears dentures    partial lower  Social History   Socioeconomic History  . Marital status: Married    Spouse name: Not on file  . Number of children: Not on file  . Years of education: Not on file  . Highest education level: Not on file  Occupational History  . Occupation: retired  Scientific laboratory technician  . Financial resource strain: Not hard at all  . Food insecurity:    Worry: Never true     Inability: Never true  . Transportation needs:    Medical: No    Non-medical: No  Tobacco Use  . Smoking status: Former Smoker    Packs/day: 0.60    Years: 56.00    Pack years: 33.60    Types: Cigarettes    Last attempt to quit: 07/14/2016    Years since quitting: 2.0  . Smokeless tobacco: Never Used  . Tobacco comment: has smoked off and on  Substance and Sexual Activity  . Alcohol use: Yes    Alcohol/week: 0.0 standard drinks    Comment: occasionally - special occasions  . Drug use: No  . Sexual activity: Not Currently    Birth control/protection: Post-menopausal  Lifestyle  . Physical activity:    Days per week: 7 days    Minutes per session: 10 min  . Stress: Very much  Relationships  . Social connections:    Talks on phone: More than three times a week    Gets together: Once a week    Attends religious service: 1 to 4 times per year    Active member of club or organization: No    Attends meetings of clubs or organizations: Never    Relationship status: Married  . Intimate partner violence:    Fear of current or ex partner: No    Emotionally abused: No    Physically abused: No    Forced sexual activity: No  Other Topics Concern  . 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 ABDOMINAL HYSTERECTOMY  1970's  . bladder botox  2005  . BLADDER SUSPENSION  2004  . CATARACT EXTRACTION W/PHACO Right 03/09/2018   Procedure: CATARACT EXTRACTION PHACO AND INTRAOCULAR LENS PLACEMENT ([place]) right;  Surgeon: Eulogio Bear, MD;  Location: [place];  Service: Ophthalmology;  Laterality: Right;  CALL CELL 1ST  . CATARACT EXTRACTION W/PHACO Left 04/19/2018   Procedure: CATARACT EXTRACTION PHACO AND INTRAOCULAR LENS PLACEMENT (I[facility])  LEFT;  Surgeon: Eulogio Bear, MD;  Location: [place];  Service: Ophthalmology;  Laterality: Left;  . COLONOSCOPY WITH PROPOFOL N/A  12/13/2015   Procedure: COLONOSCOPY WITH PROPOFOL;  Surgeon: Lucilla Lame, MD;  Location: [place];  Service: Endoscopy;  Laterality: N/A;  . LOOP RECORDER INSERTION N/A 01/07/2018   Procedure: LOOP RECORDER INSERTION;  Surgeon: Deboraha Sprang, MD;  Location: [place] [facility];  Service: Cardiovascular;  Laterality: N/A;  . POLYPECTOMY N/A 12/13/2015   Procedure: POLYPECTOMY;  Surgeon: Lucilla Lame, MD;  Location: [place];  Service: Endoscopy;  Laterality: N/A;  SIGMOID COLON POLYPS X  5  . SHOULDER ARTHROSCOPY W/ ROTATOR CUFF REPAIR Right 1998  . TEE WITHOUT CARDIOVERSION N/A 01/06/2018   Procedure: TRANSESOPHAGEAL ECHOCARDIOGRAM (TEE);  Surgeon: Minna Merritts, MD;  Location: [facility];  Service: Cardiovascular;  Laterality: N/A;  . TONSILLECTOMY AND ADENOIDECTOMY     Family History  Problem Relation Age of Onset  . Diabetes Mother   .  Heart disease Mother   . Stroke Mother   . Stroke Maternal Grandmother    Allergies  Allergen Reactions  . Baclofen Swelling  . Librium [Chlordiazepoxide] Itching    Dizziness   . Bactrim [Sulfamethoxazole-Trimethoprim] Nausea And Vomiting  . Ibuprofen Rash    Mouth swelling  . Latex Rash    Some bandaids, some gloves; BLOOD TEST NEGATIVE  . Naprosyn [Naproxen] Rash    Mouth swelling  . Other Rash    [place] nuts - mouth swelling      Assessment & Plan:  Patient presents to review vascular studies.  The patient was last seen on May 25, 2018 and evaluation of bilateral lower extremity edema and discomfort.  Since her initial visit, the patient has started to engage in conservative therapy including wearing medical grade 1 compression socks, elevating her legs and remaining active.  The patient has noticed a minimal improvement in her edema and the discomfort associated with her edema.  The patient denies any claudication-like symptoms, rest pain or ulcer formation to the bilateral legs.  The patient denies any worsening in  the amount of edema she experiences.  The patient denies any recurrent bouts of cellulitis.  The patient underwent a bilateral ABI which was notable for right: 1.01, triphasic tibials / left: 1.02, biphasic tibials.  The patient underwent a bilateral lower extremity venous duplex which was notable for no evidence of deep vein thrombosis or superficial thrombophlebitis of the bilateral lower extremity.  There is no evidence of chronic venous insufficiency to the right lower extremity.  There was chronic venous insufficiency noted in the left common femoral vein.  The patient denies any fever, nausea vomiting.  1. Chronic venous insufficiency - New Due to the location of the patient's chronic venous insufficiency in the left common femoral vein she is not a candidate for endovenous laser ablation or sclerotherapy The patient is to continue engaging in conservative therapy including wearing medical grade 1 compression socks, elevating her legs and remaining active Bring the patient back in about 2 to 3 months to assess her progress with conservative therapy We had a long discussion about the addition of a lymphedema pump.  At this time, the patient would like to continue engaging conservative therapy and follow-up in approximately 2 to 3 months to see if her symptoms have improved.  If not then we will move forward with the lymphedema pump at that time. The patient was instructed to call the office in the interim if any worsening edema or ulcerations to the legs, feet or toes occurs. The patient expresses their understanding.  2. Lymphedema - New As above  3. Aortic atherosclerosis (HCC) - Stable The patient was found to have normal ABIs The patient presents today asymptomatically denying any claudication-like symptoms, rest pain or ulcer formation to the bilateral lower extremity The patient has hard to palpate pedal pulses however her toe waveforms were normal, her lateral feet are warm and there is a  good capillary refill This can be repeated again 1 to 2 years to surveilled her aortic disease.  Current Outpatient Medications on File Prior to Visit  Medication Sig Dispense Refill  . albuterol (PROAIR HFA) 108 (90 Base) MCG/ACT inhaler Inhale 2 puffs into the lungs every 6 (six) hours as needed for wheezing or shortness of breath. 8.5 Inhaler 12  . aspirin EC 81 MG tablet Take 81 mg by mouth every other day.     Marland Kitchen atorvastatin (LIPITOR) 40 MG tablet TAKE 1 TABLET  BY MOUTH ONCE A DAY AT 6 PM 30 tablet 2  . b complex vitamins capsule Take 1 capsule by mouth daily.    . Cholecalciferol (VITAMIN D) 2000 units tablet Take 2,000 Units by mouth daily.    . citalopram (CELEXA) 20 MG tablet Take 1 tablet (20 mg total) by mouth daily. 30 tablet 3  . clopidogrel (PLAVIX) 75 MG tablet Take 1 tablet by mouth daily.  3  . clopidogrel (PLAVIX) 75 MG tablet TAKE 1 TABLET BY MOUTH ONCE A DAY 30 tablet 2  . Cyanocobalamin 1000 MCG/ML KIT Inject 1,000 mcg as directed every 30 (thirty) days.    Marland Kitchen estradiol (VIVELLE-DOT) 0.1 MG/24HR patch APPLY 1 PATCH ONTO THE SKIN 2 TIMES A WEEK 8 patch 11  . hydrochlorothiazide (HYDRODIURIL) 12.5 MG tablet Take 1 tablet (12.5 mg total) by mouth daily. 30 tablet 0  . loratadine (CLARITIN) 10 MG tablet Take 10 mg by mouth daily.    . Multiple Vitamin (MULTIVITAMIN) tablet Take 2 tablets by mouth daily.     . pantoprazole (PROTONIX) 40 MG tablet Take 1 tablet (40 mg total) by mouth daily. 90 tablet 3  . SUPER GARLIC PO Take 2 tablets by mouth daily.    . SYMBICORT 160-4.5 MCG/ACT inhaler USE 2 PUFFS TWICE DAILY 10.2 Inhaler 11  . Tiotropium Bromide Monohydrate (SPIRIVA RESPIMAT) 2.5 MCG/ACT AERS Inhale 2 puffs into the lungs daily. 1 Inhaler 12   Current Facility-Administered Medications on File Prior to Visit  Medication Dose Route Frequency Provider Last Rate Last Dose  . cyanocobalamin ((VITAMIN B-12)) injection 1,000 mcg  1,000 mcg Intramuscular Q30 days Kathrine Haddock,  NP   1,000 mcg at 06/18/18 0836   There are no Patient Instructions on file for this visit. No follow-ups on file.  Yeison Sippel A Skyrah Krupp, PA-C

## 2018-07-16 DIAGNOSIS — I712 Thoracic aortic aneurysm, without rupture: Secondary | ICD-10-CM | POA: Diagnosis not present

## 2018-07-16 DIAGNOSIS — J449 Chronic obstructive pulmonary disease, unspecified: Secondary | ICD-10-CM | POA: Diagnosis not present

## 2018-07-16 DIAGNOSIS — I69354 Hemiplegia and hemiparesis following cerebral infarction affecting left non-dominant side: Secondary | ICD-10-CM | POA: Diagnosis not present

## 2018-07-16 DIAGNOSIS — I700 Atherosclerosis of aorta: Secondary | ICD-10-CM | POA: Diagnosis not present

## 2018-07-16 DIAGNOSIS — F419 Anxiety disorder, unspecified: Secondary | ICD-10-CM | POA: Diagnosis not present

## 2018-07-16 DIAGNOSIS — N393 Stress incontinence (female) (male): Secondary | ICD-10-CM | POA: Diagnosis not present

## 2018-07-19 ENCOUNTER — Telehealth (INDEPENDENT_AMBULATORY_CARE_PROVIDER_SITE_OTHER): Payer: Self-pay

## 2018-07-19 DIAGNOSIS — I700 Atherosclerosis of aorta: Secondary | ICD-10-CM | POA: Diagnosis not present

## 2018-07-19 DIAGNOSIS — N393 Stress incontinence (female) (male): Secondary | ICD-10-CM | POA: Diagnosis not present

## 2018-07-19 DIAGNOSIS — F419 Anxiety disorder, unspecified: Secondary | ICD-10-CM | POA: Diagnosis not present

## 2018-07-19 DIAGNOSIS — I69354 Hemiplegia and hemiparesis following cerebral infarction affecting left non-dominant side: Secondary | ICD-10-CM | POA: Diagnosis not present

## 2018-07-19 DIAGNOSIS — I712 Thoracic aortic aneurysm, without rupture: Secondary | ICD-10-CM | POA: Diagnosis not present

## 2018-07-19 DIAGNOSIS — J449 Chronic obstructive pulmonary disease, unspecified: Secondary | ICD-10-CM | POA: Diagnosis not present

## 2018-07-19 NOTE — Telephone Encounter (Signed)
I will furnish you with the appropriate business card to inform the patient.

## 2018-07-19 NOTE — Telephone Encounter (Signed)
The patient called to ask about the lymphedema that she has on the left leg, can it be massaged out by a massage therapist, one that is experienced with the technique of lymphatic drainage?  She ask if there is anyone that you would recommend since her swelling has worsened?

## 2018-07-19 NOTE — Telephone Encounter (Signed)
I called the patient back to give her the infromation if the person she needs to contact and she states that she will be giving them a call tomorrow.

## 2018-07-20 ENCOUNTER — Ambulatory Visit (INDEPENDENT_AMBULATORY_CARE_PROVIDER_SITE_OTHER): Payer: Medicare Other

## 2018-07-20 DIAGNOSIS — K140 Glossitis: Secondary | ICD-10-CM

## 2018-07-21 DIAGNOSIS — I69354 Hemiplegia and hemiparesis following cerebral infarction affecting left non-dominant side: Secondary | ICD-10-CM | POA: Diagnosis not present

## 2018-07-21 DIAGNOSIS — N393 Stress incontinence (female) (male): Secondary | ICD-10-CM | POA: Diagnosis not present

## 2018-07-21 DIAGNOSIS — F419 Anxiety disorder, unspecified: Secondary | ICD-10-CM | POA: Diagnosis not present

## 2018-07-21 DIAGNOSIS — I712 Thoracic aortic aneurysm, without rupture: Secondary | ICD-10-CM | POA: Diagnosis not present

## 2018-07-21 DIAGNOSIS — J449 Chronic obstructive pulmonary disease, unspecified: Secondary | ICD-10-CM | POA: Diagnosis not present

## 2018-07-21 DIAGNOSIS — I700 Atherosclerosis of aorta: Secondary | ICD-10-CM | POA: Diagnosis not present

## 2018-07-22 ENCOUNTER — Ambulatory Visit: Payer: Self-pay | Admitting: *Deleted

## 2018-07-22 ENCOUNTER — Encounter: Payer: Self-pay | Admitting: Family Medicine

## 2018-07-22 ENCOUNTER — Ambulatory Visit (INDEPENDENT_AMBULATORY_CARE_PROVIDER_SITE_OTHER): Payer: Medicare Other | Admitting: *Deleted

## 2018-07-22 ENCOUNTER — Ambulatory Visit (INDEPENDENT_AMBULATORY_CARE_PROVIDER_SITE_OTHER): Payer: Medicare Other | Admitting: Family Medicine

## 2018-07-22 VITALS — BP 115/84 | HR 96

## 2018-07-22 DIAGNOSIS — I6310 Cerebral infarction due to embolism of unspecified precerebral artery: Secondary | ICD-10-CM

## 2018-07-22 DIAGNOSIS — I639 Cerebral infarction, unspecified: Secondary | ICD-10-CM | POA: Diagnosis not present

## 2018-07-22 DIAGNOSIS — I890 Lymphedema, not elsewhere classified: Secondary | ICD-10-CM | POA: Diagnosis not present

## 2018-07-22 DIAGNOSIS — L03116 Cellulitis of left lower limb: Secondary | ICD-10-CM | POA: Diagnosis not present

## 2018-07-22 MED ORDER — DOXYCYCLINE HYCLATE 100 MG PO TABS: 100.0000 mg | ORAL_TABLET | Freq: Two times a day (BID) | ORAL | 0 refills | Status: DC

## 2018-07-22 NOTE — Telephone Encounter (Signed)
FYI only.

## 2018-07-22 NOTE — Telephone Encounter (Signed)
Pt was seen at the office and was given a prescription for doxycycline . She had a question regarding whether if she could take it with her Plavix.  According to [facility], there is no interaction between the two medications. She was already at the pharmacy and her husband had gone in to ask the pharmacist. And he stated the pharmacist agreed, no interaction. Pt voiced understanding and will cal back for any other questions.

## 2018-07-22 NOTE — Patient Instructions (Signed)
[employer] Made Alive Adult gummy vitamins (double check ingredients but looks like there's no Vitamin K)

## 2018-07-22 NOTE — Progress Notes (Signed)
   BP 115/84   Pulse 96   LMP  (LMP Unknown)   SpO2 98%    Subjective:    Patient ID: Sharon [name], female    DOB: 1946-05-10, 72 y.o.   MRN: [id number]  HPI: [name] is a 72 y.o. female  Chief Complaint  Patient presents with  . Edema    Saw [facility]in [facility] V[facility] on 07/14/18 DX w/ Lymphedema. Recommened new compresion stockins and refferal to [facility] O.T. for lymphedema massage   Here today for LE edema f/u. Saw vascular last week and was diagnosed with lymphedema. Recommended to wear compression hose to mid calf and to get lymphedema massage through OT.   Now has a wound on left lower leg that is not healing well, now red swollen and warm. Has been cleaning it with antiseptic and keeping it covered with bandages. Denies fevers, chills, sweats.   Relevant past medical, surgical, family and social history reviewed and updated as indicated. Interim medical history since our last visit reviewed. Allergies and medications reviewed and updated.  Review of Systems  Per HPI unless specifically indicated above     Objective:    BP 115/84   Pulse 96   LMP  (LMP Unknown)   SpO2 98%   Wt Readings from Last 3 Encounters:  07/05/18 200 lb (90.7 kg)  05/25/18 205 lb (93 kg)  04/19/18 200 lb (90.7 kg)    Physical Exam  Constitutional: She is oriented to person, place, and time. She appears well-developed and well-nourished. No distress.  HENT:  Head: Atraumatic.  Eyes: Conjunctivae and EOM are normal.  Neck: Normal range of motion. Neck supple.  Cardiovascular: Normal rate and regular rhythm.  Pulmonary/Chest: Effort normal and breath sounds normal.  Musculoskeletal: Normal range of motion. She exhibits edema (2+ pitting edema b/l) and tenderness (left lower leg around wound).  Neurological: She is alert and oriented to person, place, and time.  Skin: Skin is warm. There is erythema.  1 cm circular wound anterior left lower leg, erythematous border with warmth. Area of redness now  also forming at base of leg below wound  Psychiatric: She has a normal mood and affect. Her behavior is normal.  Nursing note and vitals reviewed.   Results for orders placed or performed in visit on 07/02/18  HM MAMMOGRAPHY  Result Value Ref Range   HM Mammogram 0-4 Bi-Rad 0-4 Bi-Rad, Self Reported Normal      Assessment & Plan:   Problem List Items Addressed This Visit      Other   Lymphedema - Primary    Followed by vascular. Compression hose, elevation, soaks      Relevant Orders   Ambulatory referral to Occupational Therapy    Other Visit Diagnoses    Cellulitis of left lower extremity       Tx with doxycycline, mupirocin, and antiseptic wash. Keep leg elevated, wear compression hose.        Follow up plan: Return for Wound  check.

## 2018-07-25 NOTE — Assessment & Plan Note (Signed)
Followed by vascular. Compression hose, elevation, soaks

## 2018-07-26 NOTE — Progress Notes (Signed)
Carelink Summary Report / Loop Recorder 

## 2018-07-27 ENCOUNTER — Ambulatory Visit: Payer: Self-pay

## 2018-07-27 ENCOUNTER — Telehealth: Payer: Self-pay | Admitting: Internal Medicine

## 2018-07-27 DIAGNOSIS — I69354 Hemiplegia and hemiparesis following cerebral infarction affecting left non-dominant side: Secondary | ICD-10-CM | POA: Diagnosis not present

## 2018-07-27 DIAGNOSIS — I700 Atherosclerosis of aorta: Secondary | ICD-10-CM | POA: Diagnosis not present

## 2018-07-27 DIAGNOSIS — J449 Chronic obstructive pulmonary disease, unspecified: Secondary | ICD-10-CM | POA: Diagnosis not present

## 2018-07-27 DIAGNOSIS — I712 Thoracic aortic aneurysm, without rupture: Secondary | ICD-10-CM | POA: Diagnosis not present

## 2018-07-27 DIAGNOSIS — N393 Stress incontinence (female) (male): Secondary | ICD-10-CM | POA: Diagnosis not present

## 2018-07-27 DIAGNOSIS — F419 Anxiety disorder, unspecified: Secondary | ICD-10-CM | POA: Diagnosis not present

## 2018-07-27 NOTE — Telephone Encounter (Signed)
Patient calling stating she is on Plavix and is easily bruised  She states she's gained some weight and is trying to lose it but is not able to have salads  She is wanting to know what can we adjust so that she can have salads and work on her weight   Please advise and call patient back

## 2018-07-27 NOTE — Telephone Encounter (Signed)
Pt is with Jeneen Rinks physical Brewing technologist with [facility]. Therapist is concerned about the blistering and swelling to the patient left lower leg. Pt is also in the conversation and states that she has seen Merrie Roof on 07/22/18 for lymph edema and she placed her on Doxycycline for some redness to the leg. Now it has changed and she sees fluid filled blisters. Redness per Jeneen Rinks is the medial lateral aspect of the lower left leg.  Pt states that she has had a CVA and has an implanted CUP Paceart device. She was SOB this am but used her inhaler and feels well now. Pt instructed to continue the doxycycline until seen tomorrow. Appointment per protocol. Per patient request appointment made for afternoon. Pt also request to keep appointment scheduled for Thursday until she sees [facility]. Care advice read to patient. Pt states she wears compression stocking. Pt will continue stocking until seen. Reason for Disposition . [1] MODERATE leg swelling (e.g., swelling extends up to knees) AND [2] new onset or worsening  Answer Assessment - Initial Assessment Questions 1. ONSET: "When did the swelling start?" (e.g., minutes, hours, days)     onging 2. LOCATION: "What part of the leg is swollen?"  "Are both legs swollen or just one leg?"     Lower left leg 3. SEVERITY: "How bad is the swelling?" (e.g., localized; mild, moderate, severe)  - Localized - small area of swelling localized to one leg  - MILD pedal edema - swelling limited to foot and ankle, pitting edema < 1/4 inch (6 mm) deep, rest and elevation eliminate most or all swelling  - MODERATE edema - swelling of lower leg to knee, pitting edema > 1/4 inch (6 mm) deep, rest and elevation only partially reduce swelling  - SEVERE edema - swelling extends above knee, facial or hand swelling present      Moderate 4. REDNESS: "Does the swelling look red or infected?"     Anterior medial area of leg 5. PAIN: "Is the swelling painful to  touch?" If so, ask: "How painful is it?"   (Scale 1-10; mild, moderate or severe)     5 6. FEVER: "Do you have a fever?" If so, ask: "What is it, how was it measured, and when did it start?"      no 7. CAUSE: "What do you think is causing the leg swelling?"     cellulitis 8. MEDICAL HISTORY: "Do you have a history of heart failure, kidney disease, liver failure, or cancer?"     medtronic heart device 9. RECURRENT SYMPTOM: "Have you had leg swelling before?" If so, ask: "When was the last time?" "What happened that time?"     Swelling is chronic lymph edema 10. OTHER SYMPTOMS: "Do you have any other symptoms?" (e.g., chest pain, difficulty breathing)       Difficulty breathing this Am now ok 11. PREGNANCY: "Is there any chance you are pregnant?" "When was your last menstrual period?"       N/A  Protocols used: LEG SWELLING AND EDEMA-A-AH

## 2018-07-28 ENCOUNTER — Ambulatory Visit (INDEPENDENT_AMBULATORY_CARE_PROVIDER_SITE_OTHER): Payer: Medicare Other | Admitting: Family Medicine

## 2018-07-28 ENCOUNTER — Encounter: Payer: Self-pay | Admitting: Family Medicine

## 2018-07-28 VITALS — BP 122/78 | HR 85 | Temp 98.4°F | Wt 216.0 lb

## 2018-07-28 DIAGNOSIS — R600 Localized edema: Secondary | ICD-10-CM

## 2018-07-28 DIAGNOSIS — L03116 Cellulitis of left lower limb: Secondary | ICD-10-CM

## 2018-07-28 DIAGNOSIS — I6310 Cerebral infarction due to embolism of unspecified precerebral artery: Secondary | ICD-10-CM

## 2018-07-28 DIAGNOSIS — D692 Other nonthrombocytopenic purpura: Secondary | ICD-10-CM

## 2018-07-28 DIAGNOSIS — I890 Lymphedema, not elsewhere classified: Secondary | ICD-10-CM

## 2018-07-28 MED ORDER — CLINDAMYCIN HCL 300 MG PO CAPS: 300.0000 mg | ORAL_CAPSULE | Freq: Three times a day (TID) | ORAL | 0 refills | Status: DC

## 2018-07-28 MED ORDER — CEFTRIAXONE SODIUM 250 MG IJ SOLR
250.0000 mg | Freq: Once | INTRAMUSCULAR | Status: AC
  Administered 2018-07-28: 250 mg via INTRAMUSCULAR

## 2018-07-28 NOTE — Progress Notes (Signed)
BP 122/78   Pulse 85   Temp 98.4 F (36.9 C) (Oral)   Wt 216 lb (98 kg)   LMP  (LMP Unknown)   SpO2 97%   BMI 31.90 kg/m    Subjective:    Patient ID: Sharon [name], female    DOB: [birth year], 72 y.o.   MRN: [id number]  HPI: Sharon [name] is a 72 y.o. female  Chief Complaint  Patient presents with  . Edema    All over her body.    Here today with worsening cellulitis left lower extremity after hitting her anterior left lower leg almost a week ago. Has been on doxycycline and mupirocin ointment and elevating leg, wearing compression hose. No fevers or chills, but lower left leg significantly more red, swollen, and painful. Hx of poor healing LE wounds, PVD, and lymphedema.   Also very concerned about the bruising on both arms since starting plavix s/p CVA earlier this year. No nosebleeds, hematuria, melena, or any other bleeding issues. Just bruising with any amount of pressure.   Relevant past medical, surgical, family and social history reviewed and updated as indicated. Interim medical history since our last visit reviewed. Allergies and medications reviewed and updated.  Review of Systems  Per HPI unless specifically indicated above     Objective:    BP 122/78   Pulse 85   Temp 98.4 F (36.9 C) (Oral)   Wt 216 lb (98 kg)   LMP  (LMP Unknown)   SpO2 97%   BMI 31.90 kg/m   Wt Readings from Last 3 Encounters:  07/28/18 216 lb (98 kg)  07/05/18 200 lb (90.7 kg)  05/25/18 205 lb (93 kg)    Physical Exam  Constitutional: She is oriented to person, place, and time. She appears well-developed and well-nourished.  HENT:  Head: Atraumatic.  Eyes: Conjunctivae and EOM are normal.  Neck: Normal range of motion. Neck supple.  Cardiovascular: Normal rate, regular rhythm and normal heart sounds.  Pulmonary/Chest: Effort normal and breath sounds normal.  Musculoskeletal: Normal range of motion. She exhibits edema (generalized mild edema, with significant LE edema from  lymphedema and left LE cellulitis) and tenderness (LLE from cellulitis).  Neurological: She is alert and oriented to person, place, and time.  Skin: Skin is warm and dry. There is erythema (bright red, hot to the touch LLE).  Psychiatric: She has a normal mood and affect. Her behavior is normal.  Nursing note and vitals reviewed.   Results for orders placed or performed in visit on 00/34/91  Basic Metabolic Panel (BMET)  Result Value Ref Range   Glucose 86 65 - 99 mg/dL   BUN 13 8 - 27 mg/dL   Creatinine, Ser 0.95 0.57 - 1.00 mg/dL   GFR calc non Af Amer 60 >59 mL/min/1.73   GFR calc Af Amer 69 >59 mL/min/1.73   BUN/Creatinine Ratio 14 12 - 28   Sodium 144 134 - 144 mmol/L   Potassium 4.6 3.5 - 5.2 mmol/L   Chloride 101 96 - 106 mmol/L   CO2 25 20 - 29 mmol/L   Calcium 9.3 8.7 - 10.3 mg/dL  CBC with Differential/Platelet  Result Value Ref Range   WBC 7.1 3.4 - 10.8 x10E3/uL   RBC 4.79 3.77 - 5.28 x10E6/uL   Hemoglobin 14.4 11.1 - 15.9 g/dL   Hematocrit 41.9 34.0 - 46.6 %   MCV 88 79 - 97 fL   MCH 30.1 26.6 - 33.0 pg   MCHC 34.4  31.5 - 35.7 g/dL   RDW 12.6 12.3 - 15.4 %   Platelets 256 150 - 450 x10E3/uL   Neutrophils 73 Not Estab. %   Lymphs 17 Not Estab. %   Monocytes 10 Not Estab. %   Eos 0 Not Estab. %   Basos 0 Not Estab. %   Neutrophils Absolute 5.1 1.4 - 7.0 x10E3/uL   Lymphocytes Absolute 1.2 0.7 - 3.1 x10E3/uL   Monocytes Absolute 0.7 0.1 - 0.9 x10E3/uL   EOS (ABSOLUTE) 0.0 0.0 - 0.4 x10E3/uL   Basophils Absolute 0.0 0.0 - 0.2 x10E3/uL   Immature Granulocytes 0 Not Estab. %   Immature Grans (Abs) 0.0 0.0 - 0.1 x10E3/uL      Assessment & Plan:   Problem List Items Addressed This Visit      Cardiovascular and Mediastinum   Senile purpura ([place])    Since starting plavix. Reassurance given        Other   Lymphedema    Following with vascular surgery. Awaiting lymphedema massage and using compression stockings, leg elevation daily.        Other Visit  Diagnoses    Cellulitis of left lower extremity    -  Primary   Worsening, d/c doxycyline and starrt clindamycin. IM rocephin given in office as well. Continue home care as she has been. Return precautions given   Relevant Medications   cefTRIAXone (ROCEPHIN) injection 250 mg (Completed)   Other Relevant Orders   CBC with Differential/Platelet (Completed)   Leg edema, left       Relevant Orders   Basic Metabolic Panel (BMET) (Completed)       Follow up plan: Return in about 1 week (around 08/04/2018) for Wound check.

## 2018-07-28 NOTE — Telephone Encounter (Signed)
Patient called back and advised ok to eat salads with Plavix but stay away from grapefruit. Patient concerned about bruising. The bruising is mainly on her arms but some small ones on her legs. Offered first available appointment on November 26th but unable to do that day. She would prefer to see Dr Rockey Situ. After discussion, she agreed to see Christell Faith, PA 08/19/18. She was appreciative.

## 2018-07-28 NOTE — Patient Instructions (Signed)
[facility] [place],  [place]  [address] [address]: [phone]  November 4th @ 11 a.m.

## 2018-07-28 NOTE — Telephone Encounter (Signed)
Called patient. She was currently at another doctor's appointment. She will call back to discuss with [place] later.

## 2018-07-29 ENCOUNTER — Ambulatory Visit: Payer: Medicare Other | Admitting: Occupational Therapy

## 2018-07-29 ENCOUNTER — Ambulatory Visit: Payer: Medicare Other | Admitting: Family Medicine

## 2018-07-29 DIAGNOSIS — I69354 Hemiplegia and hemiparesis following cerebral infarction affecting left non-dominant side: Secondary | ICD-10-CM | POA: Diagnosis not present

## 2018-07-29 DIAGNOSIS — F419 Anxiety disorder, unspecified: Secondary | ICD-10-CM | POA: Diagnosis not present

## 2018-07-29 DIAGNOSIS — I700 Atherosclerosis of aorta: Secondary | ICD-10-CM | POA: Diagnosis not present

## 2018-07-29 DIAGNOSIS — N393 Stress incontinence (female) (male): Secondary | ICD-10-CM | POA: Diagnosis not present

## 2018-07-29 DIAGNOSIS — I712 Thoracic aortic aneurysm, without rupture: Secondary | ICD-10-CM | POA: Diagnosis not present

## 2018-07-29 DIAGNOSIS — J449 Chronic obstructive pulmonary disease, unspecified: Secondary | ICD-10-CM | POA: Diagnosis not present

## 2018-07-29 LAB — CBC WITH DIFFERENTIAL/PLATELET
BASOS ABS: 0 10*3/uL (ref 0.0–0.2)
Basos: 0 %
EOS (ABSOLUTE): 0 10*3/uL (ref 0.0–0.4)
EOS: 0 %
HEMATOCRIT: 41.9 % (ref 34.0–46.6)
HEMOGLOBIN: 14.4 g/dL (ref 11.1–15.9)
IMMATURE GRANULOCYTES: 0 %
Immature Grans (Abs): 0 10*3/uL (ref 0.0–0.1)
LYMPHS ABS: 1.2 10*3/uL (ref 0.7–3.1)
LYMPHS: 17 %
MCH: 30.1 pg (ref 26.6–33.0)
MCHC: 34.4 g/dL (ref 31.5–35.7)
MCV: 88 fL (ref 79–97)
MONOCYTES: 10 %
Monocytes Absolute: 0.7 10*3/uL (ref 0.1–0.9)
NEUTROS PCT: 73 %
Neutrophils Absolute: 5.1 10*3/uL (ref 1.4–7.0)
Platelets: 256 10*3/uL (ref 150–450)
RBC: 4.79 x10E6/uL (ref 3.77–5.28)
RDW: 12.6 % (ref 12.3–15.4)
WBC: 7.1 10*3/uL (ref 3.4–10.8)

## 2018-07-29 LAB — BASIC METABOLIC PANEL
BUN / CREAT RATIO: 14 (ref 12–28)
BUN: 13 mg/dL (ref 8–27)
CALCIUM: 9.3 mg/dL (ref 8.7–10.3)
CHLORIDE: 101 mmol/L (ref 96–106)
CO2: 25 mmol/L (ref 20–29)
Creatinine, Ser: 0.95 mg/dL (ref 0.57–1.00)
GFR calc Af Amer: 69 mL/min/{1.73_m2} (ref >59)
GFR calc non Af Amer: 60 mL/min/{1.73_m2} (ref >59)
GLUCOSE: 86 mg/dL (ref 65–99)
POTASSIUM: 4.6 mmol/L (ref 3.5–5.2)
Sodium: 144 mmol/L (ref 134–144)

## 2018-08-01 NOTE — Assessment & Plan Note (Signed)
Since starting plavix. Reassurance given

## 2018-08-01 NOTE — Assessment & Plan Note (Signed)
Following with vascular surgery. Awaiting lymphedema massage and using compression stockings, leg elevation daily.

## 2018-08-02 ENCOUNTER — Ambulatory Visit: Payer: Medicare Other | Admitting: Occupational Therapy

## 2018-08-04 ENCOUNTER — Encounter: Payer: Self-pay | Admitting: Family Medicine

## 2018-08-04 ENCOUNTER — Ambulatory Visit (INDEPENDENT_AMBULATORY_CARE_PROVIDER_SITE_OTHER): Payer: Medicare Other | Admitting: Family Medicine

## 2018-08-04 VITALS — BP 127/77 | HR 96 | Temp 98.7°F | Wt 216.0 lb

## 2018-08-04 DIAGNOSIS — L03116 Cellulitis of left lower limb: Secondary | ICD-10-CM | POA: Diagnosis not present

## 2018-08-04 DIAGNOSIS — I890 Lymphedema, not elsewhere classified: Secondary | ICD-10-CM

## 2018-08-04 DIAGNOSIS — I6310 Cerebral infarction due to embolism of unspecified precerebral artery: Secondary | ICD-10-CM

## 2018-08-04 MED ORDER — CLINDAMYCIN HCL 300 MG PO CAPS: 300.0000 mg | ORAL_CAPSULE | Freq: Two times a day (BID) | ORAL | 0 refills | Status: DC

## 2018-08-04 NOTE — Progress Notes (Signed)
BP 127/77   Pulse 96   Temp 98.7 F (37.1 C) (Oral)   Wt 216 lb (98 kg)   LMP  (LMP Unknown)   SpO2 100%   BMI 31.90 kg/m    Subjective:    Patient ID: Sharon [name], female    DOB: 1946/03/20, 72 y.o.   MRN: [id number]  HPI: Sharon [name] is a 72 y.o. female  Chief Complaint  Patient presents with  . Leg Swelling   Here today for left lower leg cellulitis f/u. Good improvement on clindamycin, tolerating well. Pain has improved, and redness is much lighter. No drainage, fevers, chills, sweats. Swelling from lymphedema stable - wearing stockings, elevating legs, restricting salt, and awaiting starting lymphedema massage tx's through OT. Has been treating wound with antibiotic ointment and keeping covered.   Relevant past medical, surgical, family and social history reviewed and updated as indicated. Interim medical history since our last visit reviewed. Allergies and medications reviewed and updated.  Review of Systems  Per HPI unless specifically indicated above     Objective:    BP 127/77   Pulse 96   Temp 98.7 F (37.1 C) (Oral)   Wt 216 lb (98 kg)   LMP  (LMP Unknown)   SpO2 100%   BMI 31.90 kg/m   Wt Readings from Last 3 Encounters:  08/04/18 216 lb (98 kg)  07/28/18 216 lb (98 kg)  07/05/18 200 lb (90.7 kg)    Physical Exam  Constitutional: She is oriented to person, place, and time. She appears well-developed and well-nourished.  HENT:  Head: Atraumatic.  Eyes: Conjunctivae and EOM are normal.  Neck: Normal range of motion. Neck supple.  Cardiovascular: Normal rate, regular rhythm and intact distal pulses.  Pulmonary/Chest: Effort normal and breath sounds normal.  Musculoskeletal: Normal range of motion. She exhibits edema (B/l LE edema, Left > R) and tenderness (left lower leg).  Neurological: She is alert and oriented to person, place, and time.  Skin: Skin is warm and dry. There is erythema (left lower leg, much improved from last check).    Psychiatric: She has a normal mood and affect. Her behavior is normal.  Nursing note and vitals reviewed.   Results for orders placed or performed in visit on 57/32/20  Basic Metabolic Panel (BMET)  Result Value Ref Range   Glucose 86 65 - 99 mg/dL   BUN 13 8 - 27 mg/dL   Creatinine, Ser 0.95 0.57 - 1.00 mg/dL   GFR calc non Af Amer 60 >59 mL/min/1.73   GFR calc Af Amer 69 >59 mL/min/1.73   BUN/Creatinine Ratio 14 12 - 28   Sodium 144 134 - 144 mmol/L   Potassium 4.6 3.5 - 5.2 mmol/L   Chloride 101 96 - 106 mmol/L   CO2 25 20 - 29 mmol/L   Calcium 9.3 8.7 - 10.3 mg/dL  CBC with Differential/Platelet  Result Value Ref Range   WBC 7.1 3.4 - 10.8 x10E3/uL   RBC 4.79 3.77 - 5.28 x10E6/uL   Hemoglobin 14.4 11.1 - 15.9 g/dL   Hematocrit 41.9 34.0 - 46.6 %   MCV 88 79 - 97 fL   MCH 30.1 26.6 - 33.0 pg   MCHC 34.4 31.5 - 35.7 g/dL   RDW 12.6 12.3 - 15.4 %   Platelets 256 150 - 450 x10E3/uL   Neutrophils 73 Not Estab. %   Lymphs 17 Not Estab. %   Monocytes 10 Not Estab. %   Eos 0 Not  Estab. %   Basos 0 Not Estab. %   Neutrophils Absolute 5.1 1.4 - 7.0 x10E3/uL   Lymphocytes Absolute 1.2 0.7 - 3.1 x10E3/uL   Monocytes Absolute 0.7 0.1 - 0.9 x10E3/uL   EOS (ABSOLUTE) 0.0 0.0 - 0.4 x10E3/uL   Basophils Absolute 0.0 0.0 - 0.2 x10E3/uL   Immature Granulocytes 0 Not Estab. %   Immature Grans (Abs) 0.0 0.0 - 0.1 x10E3/uL      Assessment & Plan:   Problem List Items Addressed This Visit      Other   Lymphedema    Stable, continue compression, elevation. Awaiting lymphedema massage therapy       Other Visit Diagnoses    Cellulitis of left lower extremity    -  Primary   Improving, will refill clindamycin and recheck at end of course. Continue home care as before. Strict return precautions given       Follow up plan: Return in about 1 week (around 08/11/2018) for Cellulitis f/u.

## 2018-08-06 DIAGNOSIS — N393 Stress incontinence (female) (male): Secondary | ICD-10-CM | POA: Diagnosis not present

## 2018-08-06 DIAGNOSIS — I700 Atherosclerosis of aorta: Secondary | ICD-10-CM | POA: Diagnosis not present

## 2018-08-06 DIAGNOSIS — F419 Anxiety disorder, unspecified: Secondary | ICD-10-CM | POA: Diagnosis not present

## 2018-08-06 DIAGNOSIS — I69354 Hemiplegia and hemiparesis following cerebral infarction affecting left non-dominant side: Secondary | ICD-10-CM | POA: Diagnosis not present

## 2018-08-06 DIAGNOSIS — I712 Thoracic aortic aneurysm, without rupture: Secondary | ICD-10-CM | POA: Diagnosis not present

## 2018-08-06 DIAGNOSIS — J449 Chronic obstructive pulmonary disease, unspecified: Secondary | ICD-10-CM | POA: Diagnosis not present

## 2018-08-06 LAB — CUP PACEART REMOTE DEVICE CHECK
Implantable Pulse Generator Implant Date: 20190328
MDC IDC SESS DTM: 20191009153950

## 2018-08-07 NOTE — Assessment & Plan Note (Signed)
Stable, continue compression, elevation. Awaiting lymphedema massage therapy

## 2018-08-07 NOTE — Patient Instructions (Signed)
Follow up in 1 week

## 2018-08-09 ENCOUNTER — Telehealth: Payer: Self-pay | Admitting: Gastroenterology

## 2018-08-09 ENCOUNTER — Encounter: Payer: Self-pay | Admitting: Gastroenterology

## 2018-08-09 ENCOUNTER — Ambulatory Visit (INDEPENDENT_AMBULATORY_CARE_PROVIDER_SITE_OTHER): Payer: Medicare Other | Admitting: Gastroenterology

## 2018-08-09 VITALS — BP 129/63 | HR 97 | Ht 69.0 in | Wt 216.0 lb

## 2018-08-09 DIAGNOSIS — I6310 Cerebral infarction due to embolism of unspecified precerebral artery: Secondary | ICD-10-CM | POA: Diagnosis not present

## 2018-08-09 DIAGNOSIS — K2100 Gastro-esophageal reflux disease with esophagitis, without bleeding: Secondary | ICD-10-CM

## 2018-08-09 NOTE — Telephone Encounter (Signed)
Pt is calling for Ginger to schedule x rays

## 2018-08-09 NOTE — Patient Instructions (Signed)
Please call Arlon Bleier at [phone] - direct line or our main office number at [phone]

## 2018-08-09 NOTE — Progress Notes (Signed)
Primary Care Physician: Kathrine Haddock, NP  Primary Gastroenterologist:  Dr. Lucilla Lame  No chief complaint on file.   HPI: Sharon [name] is a 72 y.o. female here for recent episode of severe heartburn.  The patient had a colonoscopy back in 2017 by me and has been on Protonix for her heartburn.  The patient states that a few weeks ago she started to have severe heartburn.  The patient is presently on Plavix for history of strokes.  The patient also reports that her heartburn may have been contributed to by her recent weight gain.  There is no report of any black stools or bloody stools.  She also denies any hematemesis.  The patient also reports that since making the appointment her heartburn has improved greatly.  Current Outpatient Medications  Medication Sig Dispense Refill  . albuterol (PROAIR HFA) 108 (90 Base) MCG/ACT inhaler Inhale 2 puffs into the lungs every 6 (six) hours as needed for wheezing or shortness of breath. 8.5 Inhaler 12  . aspirin EC 81 MG tablet Take 81 mg by mouth every other day.     Marland Kitchen atorvastatin (LIPITOR) 40 MG tablet TAKE 1 TABLET BY MOUTH ONCE A DAY AT 6 PM 30 tablet 2  . b complex vitamins capsule Take 1 capsule by mouth daily.    . citalopram (CELEXA) 20 MG tablet Take 1 tablet (20 mg total) by mouth daily. 30 tablet 3  . clindamycin (CLEOCIN) 300 MG capsule Take 1 capsule (300 mg total) by mouth 2 (two) times daily. 14 capsule 0  . clopidogrel (PLAVIX) 75 MG tablet Take 1 tablet by mouth daily.  3  . clopidogrel (PLAVIX) 75 MG tablet TAKE 1 TABLET BY MOUTH ONCE A DAY 30 tablet 2  . Cyanocobalamin 1000 MCG/ML KIT Inject 1,000 mcg as directed every 30 (thirty) days.    Marland Kitchen estradiol (VIVELLE-DOT) 0.1 MG/24HR patch APPLY 1 PATCH ONTO THE SKIN 2 TIMES A WEEK 8 patch 11  . Multiple Vitamin (MULTIVITAMIN) tablet Take 2 tablets by mouth daily.     . pantoprazole (PROTONIX) 40 MG tablet Take 1 tablet (40 mg total) by mouth daily. 90 tablet 3  . SYMBICORT 160-4.5  MCG/ACT inhaler USE 2 PUFFS TWICE DAILY 10.2 Inhaler 11  . Tiotropium Bromide Monohydrate (SPIRIVA RESPIMAT) 2.5 MCG/ACT AERS Inhale 2 puffs into the lungs daily. 1 Inhaler 12  . Cholecalciferol (VITAMIN D) 2000 units tablet Take 2,000 Units by mouth daily.    Marland Kitchen loratadine (CLARITIN) 10 MG tablet Take 10 mg by mouth daily.    . SUPER GARLIC PO Take 2 tablets by mouth daily.     Current Facility-Administered Medications  Medication Dose Route Frequency Provider Last Rate Last Dose  . cyanocobalamin ((VITAMIN B-12)) injection 1,000 mcg  1,000 mcg Intramuscular Q30 days Kathrine Haddock, NP   1,000 mcg at 07/20/18 1326    Allergies as of 08/09/2018 - Review Complete 08/04/2018  Allergen Reaction Noted  . Baclofen Swelling 04/08/2018  . Librium [chlordiazepoxide] Itching 03/13/2017  . Bactrim [sulfamethoxazole-trimethoprim] Nausea And Vomiting 09/15/2016  . Ibuprofen Rash 02/08/2015  . Latex Rash 06/21/2015  . Naprosyn [naproxen] Rash 02/08/2015  . Other Rash 12/05/2015    ROS:  General: Negative for anorexia, weight loss, fever, chills, fatigue, weakness. ENT: Negative for hoarseness, difficulty swallowing , nasal congestion. CV: Negative for chest pain, angina, palpitations, dyspnea on exertion, peripheral edema.  Respiratory: Negative for dyspnea at rest, dyspnea on exertion, cough, sputum, wheezing.  GI: See history of  present illness. GU:  Negative for dysuria, hematuria, urinary incontinence, urinary frequency, nocturnal urination.  Endo: Negative for unusual weight change.    Physical Examination:   BP 129/63   Pulse 97   Ht 5' 9"  (1.753 m)   Wt 216 lb (98 kg)   LMP  (LMP Unknown)   BMI 31.90 kg/m   General: Well-nourished, well-developed in no acute distress.  Eyes: No icterus. Conjunctivae pink. Mouth: Oropharyngeal mucosa moist and pink , no lesions erythema or exudate. Lungs: Clear to auscultation bilaterally. Non-labored. Heart: Regular rate and rhythm, no murmurs  rubs or gallops.  Abdomen: Bowel sounds are normal, nontender, nondistended, no hepatosplenomegaly or masses, no abdominal bruits or hernia , no rebound or guarding.   Extremities: No lower extremity edema. No clubbing or deformities. Neuro: Alert and oriented x 3.  Grossly intact. Skin: Warm and dry, no jaundice.   Psych: Alert and cooperative, normal mood and affect.  Labs:    Imaging Studies: No results found.  Assessment and Plan:   Sharon [name] is a 72 y.o. y/o female who comes in after having an episode of severe heartburn with a burning sensation in her mouth and the taste of bile.  The patient attributed to her recent weight gain.  The patient has been told that when she gets these episodes she should double up on her Protonix to see if it helps.  The patient is not a candidate for any upper endoscopy at this time because she is on Plavix.  Therefore the patient will be set up for an upper GI series to look for any hiatal hernia versus signs of reflux.  The patient has been explained the plan and agrees with it.    Lucilla Lame, MD. Marval Regal   Note: This dictation was prepared with Dragon dictation along with smaller phrase technology. Any transcriptional errors that result from this process are unintentional.

## 2018-08-10 ENCOUNTER — Other Ambulatory Visit: Payer: Self-pay | Admitting: Unknown Physician Specialty

## 2018-08-10 NOTE — Telephone Encounter (Signed)
Requested Prescriptions  Pending Prescriptions Disp Refills  . PROAIR HFA 108 (90 Base) MCG/ACT inhaler [Pharmacy Med Name: PROAIR HFA 90 MCG INHALER] 8.5 Inhaler 11    Sig: USE 2 PUFFS EVERY SIX HOURS AS NEEDED FOR WHEEZING OR SHORTNESS OF BREATH     Pulmonology:  Beta Agonists Failed - 08/10/2018 10:26 AM      Failed - One inhaler should last at least one month. If the patient is requesting refills earlier, contact the patient to check for uncontrolled symptoms.      Passed - Valid encounter within last 12 months    Recent Outpatient Visits          6 days ago Cellulitis of left lower extremity   [place], [place], [place]   1 week ago Cellulitis of left lower extremity   [facility] Merrie Roof [place], [place]   2 weeks ago Lymphedema   [facility] Merrie Roof [place], [place]   1 month ago Bilateral leg edema   [facility] Merrie Roof [place], [place]   2 months ago Hoarse voice quality   [place], [place], [place]      Future Appointments            Tomorrow Orene Desanctis, Lilia Argue, [place], [place]   In 4 weeks [place], [name], NP [employer], [facility]   In 5 months  [employer], [facility]

## 2018-08-10 NOTE — Telephone Encounter (Signed)
Pt is calling  For Ginger  To schedule X ray

## 2018-08-11 ENCOUNTER — Ambulatory Visit (INDEPENDENT_AMBULATORY_CARE_PROVIDER_SITE_OTHER): Payer: Medicare Other | Admitting: Family Medicine

## 2018-08-11 ENCOUNTER — Encounter: Payer: Self-pay | Admitting: Family Medicine

## 2018-08-11 VITALS — BP 121/72 | HR 90 | Temp 97.7°F | Wt 216.0 lb

## 2018-08-11 DIAGNOSIS — L03116 Cellulitis of left lower limb: Secondary | ICD-10-CM | POA: Diagnosis not present

## 2018-08-11 DIAGNOSIS — I700 Atherosclerosis of aorta: Secondary | ICD-10-CM | POA: Diagnosis not present

## 2018-08-11 DIAGNOSIS — I6310 Cerebral infarction due to embolism of unspecified precerebral artery: Secondary | ICD-10-CM

## 2018-08-11 DIAGNOSIS — I69354 Hemiplegia and hemiparesis following cerebral infarction affecting left non-dominant side: Secondary | ICD-10-CM | POA: Diagnosis not present

## 2018-08-11 DIAGNOSIS — I712 Thoracic aortic aneurysm, without rupture: Secondary | ICD-10-CM | POA: Diagnosis not present

## 2018-08-11 DIAGNOSIS — J449 Chronic obstructive pulmonary disease, unspecified: Secondary | ICD-10-CM | POA: Diagnosis not present

## 2018-08-11 DIAGNOSIS — F419 Anxiety disorder, unspecified: Secondary | ICD-10-CM | POA: Diagnosis not present

## 2018-08-11 DIAGNOSIS — N393 Stress incontinence (female) (male): Secondary | ICD-10-CM | POA: Diagnosis not present

## 2018-08-11 NOTE — Telephone Encounter (Signed)
LVM for pt to return my call.

## 2018-08-11 NOTE — Progress Notes (Signed)
BP 121/72   Pulse 90   Temp 97.7 F (36.5 C) (Oral)   Wt 216 lb (98 kg)   LMP  (LMP Unknown)   SpO2 96%   BMI 31.90 kg/m    Subjective:    Patient ID: Sharon [name], female    DOB: 10-08-1946, 72 y.o.   MRN: [id number]  HPI: Sharon [name] is a 72 y.o. female  Chief Complaint  Patient presents with  . Cellulitis    Follow up. Pt states it about the same. was very dry, was using lotion. Made it redder.    Here today for cellulitis f/u. Completed abx, feeling better overall but still having leg soreness and some redness. No fevers, chills, sweats, drainage and swelling has improved some. Awaiting lymphedema massage consultation in the next few weeks. Still using compression stockings and elevating legs daily.   Relevant past medical, surgical, family and social history reviewed and updated as indicated. Interim medical history since our last visit reviewed. Allergies and medications reviewed and updated.  Review of Systems  Per HPI unless specifically indicated above     Objective:    BP 121/72   Pulse 90   Temp 97.7 F (36.5 C) (Oral)   Wt 216 lb (98 kg)   LMP  (LMP Unknown)   SpO2 96%   BMI 31.90 kg/m   Wt Readings from Last 3 Encounters:  08/11/18 216 lb (98 kg)  08/09/18 216 lb (98 kg)  08/04/18 216 lb (98 kg)    Physical Exam  Constitutional: She is oriented to person, place, and time. She appears well-developed and well-nourished. No distress.  HENT:  Head: Atraumatic.  Eyes: Conjunctivae and EOM are normal.  Neck: Normal range of motion. Neck supple.  Cardiovascular: Normal rate and regular rhythm.  Pulmonary/Chest: Effort normal and breath sounds normal.  Musculoskeletal: Normal range of motion. She exhibits edema (b/l stable lymphedema).  Neurological: She is alert and oriented to person, place, and time.  Skin: Skin is warm. There is erythema (stable erythema lower half of left calf diffusely).  Left lower leg wound healed well. No evidence of  active infection. No warmth, swelling improved. TTP diffusely, worse anteriorily  Psychiatric: She has a normal mood and affect. Her behavior is normal.  Nursing note and vitals reviewed.   Results for orders placed or performed in visit on 70/35/00  Basic Metabolic Panel (BMET)  Result Value Ref Range   Glucose 86 65 - 99 mg/dL   BUN 13 8 - 27 mg/dL   Creatinine, Ser 0.95 0.57 - 1.00 mg/dL   GFR calc non Af Amer 60 >59 mL/min/1.73   GFR calc Af Amer 69 >59 mL/min/1.73   BUN/Creatinine Ratio 14 12 - 28   Sodium 144 134 - 144 mmol/L   Potassium 4.6 3.5 - 5.2 mmol/L   Chloride 101 96 - 106 mmol/L   CO2 25 20 - 29 mmol/L   Calcium 9.3 8.7 - 10.3 mg/dL  CBC with Differential/Platelet  Result Value Ref Range   WBC 7.1 3.4 - 10.8 x10E3/uL   RBC 4.79 3.77 - 5.28 x10E6/uL   Hemoglobin 14.4 11.1 - 15.9 g/dL   Hematocrit 41.9 34.0 - 46.6 %   MCV 88 79 - 97 fL   MCH 30.1 26.6 - 33.0 pg   MCHC 34.4 31.5 - 35.7 g/dL   RDW 12.6 12.3 - 15.4 %   Platelets 256 150 - 450 x10E3/uL   Neutrophils 73 Not Estab. %  Lymphs 17 Not Estab. %   Monocytes 10 Not Estab. %   Eos 0 Not Estab. %   Basos 0 Not Estab. %   Neutrophils Absolute 5.1 1.4 - 7.0 x10E3/uL   Lymphocytes Absolute 1.2 0.7 - 3.1 x10E3/uL   Monocytes Absolute 0.7 0.1 - 0.9 x10E3/uL   EOS (ABSOLUTE) 0.0 0.0 - 0.4 x10E3/uL   Basophils Absolute 0.0 0.0 - 0.2 x10E3/uL   Immature Granulocytes 0 Not Estab. %   Immature Grans (Abs) 0.0 0.0 - 0.1 x10E3/uL      Assessment & Plan:   Problem List Items Addressed This Visit    None    Visit Diagnoses    Cellulitis of left lower extremity    -  Primary   Infection appears to be resolved, some residual erythema and stable lymphedema. Cont stockings, elevation. Start epsom salt soaks, f/u with vascular    Return precautions given for if fevers, chills, sweats, drainage, worsening pain or heat, etc   Follow up plan: Return if symptoms worsen or fail to improve.

## 2018-08-11 NOTE — Patient Instructions (Signed)
Follow up as needed

## 2018-08-13 NOTE — Telephone Encounter (Signed)
Pt scheduled for an upper GI series on Friday, Nov 8th at 11:00am at [facility]. Pt has been advised of this information.

## 2018-08-16 ENCOUNTER — Ambulatory Visit: Payer: Medicare Other | Admitting: Occupational Therapy

## 2018-08-19 ENCOUNTER — Ambulatory Visit: Payer: Medicare Other | Admitting: Physician Assistant

## 2018-08-20 ENCOUNTER — Ambulatory Visit
Admission: RE | Admit: 2018-08-20 | Discharge: 2018-08-20 | Disposition: A | Discharge status: Home / Self Care | Payer: Medicare Other | Source: Ambulatory Visit | Attending: Gastroenterology | Admitting: Gastroenterology

## 2018-08-20 DIAGNOSIS — K2100 Gastro-esophageal reflux disease with esophagitis, without bleeding: Secondary | ICD-10-CM

## 2018-08-20 DIAGNOSIS — K224 Dyskinesia of esophagus: Secondary | ICD-10-CM | POA: Diagnosis not present

## 2018-08-20 DIAGNOSIS — K449 Diaphragmatic hernia without obstruction or gangrene: Secondary | ICD-10-CM | POA: Diagnosis not present

## 2018-08-23 ENCOUNTER — Ambulatory Visit (INDEPENDENT_AMBULATORY_CARE_PROVIDER_SITE_OTHER): Payer: Medicare Other | Admitting: *Deleted

## 2018-08-23 DIAGNOSIS — I639 Cerebral infarction, unspecified: Secondary | ICD-10-CM | POA: Diagnosis not present

## 2018-08-24 ENCOUNTER — Ambulatory Visit (INDEPENDENT_AMBULATORY_CARE_PROVIDER_SITE_OTHER): Payer: Medicare Other

## 2018-08-24 ENCOUNTER — Ambulatory Visit: Payer: Medicare Other | Admitting: Unknown Physician Specialty

## 2018-08-24 DIAGNOSIS — K140 Glossitis: Secondary | ICD-10-CM

## 2018-08-24 MED ORDER — CYANOCOBALAMIN 1000 MCG/ML IJ SOLN
1000.0000 ug | Freq: Once | INTRAMUSCULAR | Status: AC
  Administered 2018-08-24: 1000 ug via INTRAMUSCULAR

## 2018-08-24 NOTE — Progress Notes (Signed)
Carelink Summary Report / Loop Recorder 

## 2018-08-25 ENCOUNTER — Encounter: Payer: Medicare Other | Admitting: Occupational Therapy

## 2018-08-26 ENCOUNTER — Telehealth: Payer: Self-pay

## 2018-08-26 NOTE — Telephone Encounter (Signed)
LVM for pt to return my call.

## 2018-08-26 NOTE — Telephone Encounter (Signed)
Pt returned my call and was notified of Upper GI series results.

## 2018-08-26 NOTE — Telephone Encounter (Signed)
-----   Message from Lucilla Lame, MD sent at 08/25/2018  8:39 AM EST ----- Let the patient know that her upper GI series showed her to have a small hiatal hernia and some esophageal dysmotility meaning that the esophagus is not working well to push food down.

## 2018-08-30 ENCOUNTER — Encounter: Payer: Medicare Other | Admitting: Occupational Therapy

## 2018-08-31 ENCOUNTER — Other Ambulatory Visit: Payer: Self-pay

## 2018-08-31 ENCOUNTER — Ambulatory Visit: Payer: Medicare Other | Attending: Family Medicine | Admitting: Occupational Therapy

## 2018-08-31 ENCOUNTER — Encounter: Payer: Self-pay | Admitting: Occupational Therapy

## 2018-08-31 ENCOUNTER — Ambulatory Visit: Payer: Medicare Other | Admitting: Occupational Therapy

## 2018-08-31 DIAGNOSIS — I890 Lymphedema, not elsewhere classified: Secondary | ICD-10-CM | POA: Insufficient documentation

## 2018-09-01 ENCOUNTER — Encounter: Payer: Medicare Other | Admitting: Occupational Therapy

## 2018-09-02 ENCOUNTER — Ambulatory Visit: Payer: Medicare Other | Admitting: Occupational Therapy

## 2018-09-02 DIAGNOSIS — I890 Lymphedema, not elsewhere classified: Secondary | ICD-10-CM

## 2018-09-02 NOTE — Therapy (Signed)
[place] MAIN [facility] SERVICES [address] [place], [name], [address] Phone: [phone]   Fax:  [phone]  Occupational Therapy Evaluation  Patient Details  Name: Sharon [name] MRN: [id number] Date of Birth: 19-May-1946 No data recorded  Encounter Date: 08/31/2018    Past Medical History:  Diagnosis Date  . Anxiety   . Arthritis    hands, upper back  . Asthma   . COPD (chronic obstructive pulmonary disease) ([place])   . History of cervical cancer   . Menopausal disorder   . Osteoporosis   . Pneumonia 1960  . Spasm of abdominal muscles of right side    intermittent  . TMJ (dislocation of temporomandibular joint)   . Wears dentures    partial lower    Past Surgical History:  Procedure Laterality Date  . ABDOMINAL HYSTERECTOMY  1970's  . bladder botox  2005  . BLADDER SUSPENSION  2004  . CATARACT EXTRACTION W/PHACO Right 03/09/2018   Procedure: CATARACT EXTRACTION PHACO AND INTRAOCULAR LENS PLACEMENT ([place]) right;  Surgeon: Eulogio Bear, MD;  Location: [place];  Service: Ophthalmology;  Laterality: Right;  CALL CELL 1ST  . CATARACT EXTRACTION W/PHACO Left 04/19/2018   Procedure: CATARACT EXTRACTION PHACO AND INTRAOCULAR LENS PLACEMENT ([facility])  LEFT;  Surgeon: Eulogio Bear, MD;  Location: [place];  Service: Ophthalmology;  Laterality: Left;  . COLONOSCOPY WITH PROPOFOL N/A 12/13/2015   Procedure: COLONOSCOPY WITH PROPOFOL;  Surgeon: Lucilla Lame, MD;  Location: [place];  Service: Endoscopy;  Laterality: N/A;  . LOOP RECORDER INSERTION N/A 01/07/2018   Procedure: LOOP RECORDER INSERTION;  Surgeon: Deboraha Sprang, MD;  Location: [place] [facility];  Service: Cardiovascular;  Laterality: N/A;  . POLYPECTOMY N/A 12/13/2015   Procedure: POLYPECTOMY;  Surgeon: Lucilla Lame, MD;  Location: [place];  Service: Endoscopy;  Laterality: N/A;  SIGMOID COLON POLYPS X  5  . SHOULDER ARTHROSCOPY W/  ROTATOR CUFF REPAIR Right 1998  . TEE WITHOUT CARDIOVERSION N/A 01/06/2018   Procedure: TRANSESOPHAGEAL ECHOCARDIOGRAM (TEE);  Surgeon: Minna Merritts, MD;  Location: [facility];  Service: Cardiovascular;  Laterality: N/A;  . TONSILLECTOMY AND ADENOIDECTOMY      There were no vitals filed for this visit.                      OT Education - 09/02/18 1525    Education Details  Provided Pt and family skilled education regarding lymphatic structure and function, lymphedema etiologies, onset patterns and stages of progression. Discussed  impact of obesity on lymphatic system function. Outlined Complete Decongestive Therapy (CDT)  as standard of LE care and provided in depth information regarding 4 primary components of both Intensive and Self Management Phases, including Manual Lymph Drainage (MLD), compression wrapping and garments, skin care, and therapeutic exercise.   Discussed   Importance of daily, ongoing LE self-care essential to retaining clinical gains and limiting progression.  Lastly, reviewed lymphedema precautions, including cellulitis risk and difficulty with wound healing. Provided printed Lymphedema Workbook for reference.    Person(s) Educated  Patient;Spouse    Methods  Explanation;Demonstration;Handout    Comprehension  Verbalized understanding;Returned demonstration;Need further instruction          OT Long Term Goals - 09/02/18 1635      OT LONG TERM GOAL #1   Title  Pt will demonstrate understanding of lymphedema (LE) precautions / prevention principals, including signs / symptoms of cellulitis infection with modified  independence using LE Workbook as printed reference to identify 6 precautions without verbal cues by end of 3rd  OT Rx visit.   (Pended)     Baseline  Max A  (Pended)     Time  3  (Pended)     Period  Days  (Pended)     Status  New  (Pended)     Target Date  --  (Pended)    3rd OT treatment visit     OT LONG TERM GOAL #2   Title  Pt  will be able to apply knee-length, multi-layer, short stretch compression wraps using correct gradient techniques with moderate  caregiver assistance to achieve optimal limb volume reduction during Intensive Phase CDT, and to return affected limb(s) as closely as possible, to premorbid size and shape.  (Pended)     Baseline  Max A  (Pended)     Time  2  (Pended)     Period  Weeks  (Pended)     Status  New  (Pended)     Target Date  --  (Pended)    4-5th OT Rx visit     OT LONG TERM GOAL #3   Title  Pt to achieve at least  10% limb volume reduction in affected limb(s)  bilaterally during Intensive Phase CDT to control limb swelling, to improve tissue integrity and immune function, to improve ADLs performance and to improve functional mobility/ transfer, and to improve body image and self-esteem.  (Pended)     Baseline  dependent  (Pended)     Time  12  (Pended)     Period  Weeks  (Pended)     Status  New  (Pended)     Target Date  11/29/18  (Pended)       OT LONG TERM GOAL #4   Title  Pt will achieve 100% compliance with daily LE self-care home program components, including daily  skin care, simple self-MLD, gradient compression wraps/ compression garments/devices, and therapeutic exercise with Mod CG support to ensure optimal Intensive Phase limb volume reduction to expedite compression garment/ device fitting.  (Pended)     Baseline  dependent  (Pended)     Time  12  (Pended)     Period  Weeks  (Pended)     Status  New  (Pended)     Target Date  11/29/18  (Pended)       OT LONG TERM GOAL #5   Title  Pt will use appropriate  assistive devices during LE self-care training to don compression garments/devices with modified independence to ensure optimal LE self-management over time and to limit progression of chronic LE.  (Pended)     Baseline  Max A  (Pended)     Time  12  (Pended)     Period  Weeks  (Pended)     Status  New  (Pended)       Long Term Additional Goals   Additional Long  Term Goals  Yes  (Pended)       OT LONG TERM GOAL #6   Title  Pt will retain optimal limb volume reductions achieved during Intensive Phase CDT with no more than 3% volume increase with ongoing CG assistance to limit LE progression and further functional decline.  (Pended)     Baseline  Max A  (Pended)     Time  6  (Pended)     Period  Months  (Pended)     Status  New  (  Pended)     Target Date  03/01/19  (Pended)             Plan - 09/02/18 1528    Clinical Impression Statement  ICIE KUZNICKI is a 72  y o female presenting with chronic, progressive, BLE lymphedema (LE)- mild,, stage II- secondary to CVI.  Exacerbating  factors include hx of cellulitis and non-healing leg wound.  Although swelling is mild below the legs, tissue changes due to build-up, protein-rich tissue fibrosis. Is observed. Skin is tight and edema is brawny, limiting skin flexibility and joint AROM at ankles and feet, L>R. Marland Kitchen BLE LE and associated pain/ discomfort limits Pt's ability to perform functional activities in all occupational domains, including basic and instrumental ADLs, productive activities and leisure pursuits, social participation and role performance a primary caregiver to spouse, and functional mobility, transfers and safe ambulation. Mrs Grieder will benefit from skilled Occupational Therapy for Intensive and Management Phase Complete Decongestive Therapy (CDT) to include manual lymphatic drainage, skin care, therapeutic exercise and compression therapy. Emphasis throughout OT course will also focus on Pt and family education to facilitate long term self-management . Without skilled OT for CDT LE will progress resulting in worsening condition, ongoing infection risk and further functional decline.    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   Rehab  Potential  Good    OT Frequency  3x / week    OT Duration  12 weeks    OT Treatment/Interventions  Self-care/ADL training;Therapeutic exercise;DME and/or AE instruction;Therapist, nutritional;Compression bandaging;Balance training;Other (comment);Manual Therapy;Coping strategies training;Patient/family education;Therapeutic activities;Manual lymph drainage;Energy conservation   skin care with low ph lotion   Clinical Decision Making  Several treatment options, min-mod task modification necessary    OT Home Exercise Plan  lymphatic pumping ther ex    Recommended Other Services  fit w/ ccl 2 knee length compression stockings and convoluted HOS devices to decrease fibrosis    Consulted and Agree with Plan of Care  Patient;Family member/caregiver    Family Member Consulted  spouse       Patient will benefit from skilled therapeutic intervention in order to improve the following deficits and impairments:  Abnormal gait, Decreased balance, Decreased mobility, Decreased skin integrity, Difficulty walking, Decreased range of motion, Increased edema, Pain, Decreased activity tolerance, Decreased knowledge of use of DME, Impaired flexibility  Visit Diagnosis: Lymphedema, not elsewhere classified - Plan: Ot plan of care cert/re-cert    Problem List Patient Active Problem List   Diagnosis Date Noted  . Senile purpura ([place]) 07/28/2018  . Chronic venous insufficiency 07/14/2018  . Lymphedema 07/14/2018  . Aneurysm of descending thoracic aorta (HCC) 05/04/2018  . Allergic rhinitis 02/16/2018  . Bruising 02/16/2018  . Superficial bruising of arm, right, initial encounter 02/01/2018  . Muscle spasm 02/01/2018  . Aortic atherosclerosis ([place]) 01/13/2018  . Status post placement of implantable loop recorder 01/12/2018  . Left-sided weakness 01/12/2018  . Chronic kidney disease, stage 3 ([place]) 01/12/2018  . Cerebrovascular accident (CVA) due to embolism of precerebral artery ([place])   . TIA  (transient ischemic attack) 01/04/2018  . Osteoarthritis of wrist 12/09/2017  . Piriformis syndrome, left 11/25/2017  . CAD (coronary artery disease) 11/25/2017  . Caregiver stress 11/25/2017  . Menopause 08/14/2017  . Abnormal weight gain 08/14/2017  . De Quervain's tenosynovitis, right 07/17/2017  . Stress incontinence 06/19/2017  . Personal history of tobacco  use, presenting hazards to health 04/01/2017  . Anxiety 03/13/2017  . Glossitis 11/14/2016  . Advanced care planning/counseling discussion 10/17/2016  . Other fecal abnormalities   . Benign neoplasm of sigmoid colon   . COPD (chronic obstructive pulmonary disease) ([place]) 10/17/2015  . Mass of upper inner quadrant of right breast 06/22/2015    Andrey Spearman, MS, OTR/L, [facility] 09/02/18 5:01 PM  [place] MAIN [facility] SERVICES [address] [place], [name], [address] Phone: [phone]   Fax:  [phone]  Name: WHITTANY PARISH MRN: [id number] Date of Birth: [birth year]

## 2018-09-02 NOTE — Patient Instructions (Signed)

## 2018-09-02 NOTE — Therapy (Signed)
[place] MAIN [facility] SERVICES [address] [place], [name], [address] Phone: [phone]   Fax:  [phone]  Occupational Therapy Treatment  Patient Details  Name: Sharon [name] MRN: [id number] Date of Birth: Aug 15, 1946 No data recorded  Encounter Date: 09/02/2018  OT End of Session - 09/02/18 1717    Visit Number  2    Number of Visits  36    Date for OT Re-Evaluation  11/29/18    OT Start Time  1100    OT Stop Time  1210    OT Time Calculation (min)  70 min    Activity Tolerance  Patient tolerated treatment well;No increased pain    Behavior During Therapy  WFL for tasks assessed/performed       Past Medical History:  Diagnosis Date  . Anxiety   . Arthritis    hands, upper back  . Asthma   . COPD (chronic obstructive pulmonary disease) ([place])   . History of cervical cancer   . Menopausal disorder   . Osteoporosis   . Pneumonia 1960  . Spasm of abdominal muscles of right side    intermittent  . TMJ (dislocation of temporomandibular joint)   . Wears dentures    partial lower    Past Surgical History:  Procedure Laterality Date  . ABDOMINAL HYSTERECTOMY  1970's  . bladder botox  2005  . BLADDER SUSPENSION  2004  . CATARACT EXTRACTION W/PHACO Right 03/09/2018   Procedure: CATARACT EXTRACTION PHACO AND INTRAOCULAR LENS PLACEMENT ([place]) right;  Surgeon: Eulogio Bear, MD;  Location: [place];  Service: Ophthalmology;  Laterality: Right;  CALL CELL 1ST  . CATARACT EXTRACTION W/PHACO Left 04/19/2018   Procedure: CATARACT EXTRACTION PHACO AND INTRAOCULAR LENS PLACEMENT ([facility])  LEFT;  Surgeon: Eulogio Bear, MD;  Location: [place];  Service: Ophthalmology;  Laterality: Left;  . COLONOSCOPY WITH PROPOFOL N/A 12/13/2015   Procedure: COLONOSCOPY WITH PROPOFOL;  Surgeon: Lucilla Lame, MD;  Location: [place];  Service: Endoscopy;  Laterality: N/A;  . LOOP RECORDER INSERTION N/A 01/07/2018    Procedure: LOOP RECORDER INSERTION;  Surgeon: Deboraha Sprang, MD;  Location: [place] [facility];  Service: Cardiovascular;  Laterality: N/A;  . POLYPECTOMY N/A 12/13/2015   Procedure: POLYPECTOMY;  Surgeon: Lucilla Lame, MD;  Location: [place];  Service: Endoscopy;  Laterality: N/A;  SIGMOID COLON POLYPS X  5  . SHOULDER ARTHROSCOPY W/ ROTATOR CUFF REPAIR Right 1998  . TEE WITHOUT CARDIOVERSION N/A 01/06/2018   Procedure: TRANSESOPHAGEAL ECHOCARDIOGRAM (TEE);  Surgeon: Minna Merritts, MD;  Location: [facility];  Service: Cardiovascular;  Laterality: N/A;  . TONSILLECTOMY AND ADENOIDECTOMY      There were no vitals filed for this visit.  Subjective Assessment - 09/02/18 1701    Subjective   Sharon [name] presemnts for OT Rx visit 2 / 36 to address BLE LE. Pt is accompanied by her spouse, [name], who will assist her with compression wraps between clinical sessions. Pt states she is eager to get started with CDT.    Patient is accompained by:  Family member    Pertinent History  Hx anxiety, COPD, OA, Asthma, hx cervical ca, osteoporosis, hx pneumonia, hx CVA, hx LLE cellulitis, hx L leg wound, hx falls    Limitations  chronic BLE leg swelling and associated pain, decreased balance, fall risk, decreased standing, walking and sitting tolerance, difficulty w/ lower body bathing and dressing, difficulty fitting LB clothing and shoes, unable to  drive, impaired transfers and functional mobility, impaired social participation, impaired ability to perform household chores, cooking, meal prep    Repetition  Increases Symptoms    Special Tests  Dopler negative for DVT    Patient Stated Goals  reduce leg pain and swelling so I can do more    Currently in Pain?  Yes   Pt did not rate numerically. Pain is unchanged since initial eval ion 08/31/18   Pain Location  Leg    Pain Orientation  Right;Left    Pain Descriptors / Indicators   Aching;Heaviness;Tender;Throbbing;Discomfort;Sore;Tightness;Pressure;Tiring    Pain Type  Chronic pain          LYMPHEDEMA/ONCOLOGY QUESTIONNAIRE - 09/02/18 1706      Lymphedema Assessments   Lymphedema Assessments  Lower extremities      Right Lower Extremity Lymphedema   Other  RLE limb volume from ankle to tibial tuberosity (A-D) measures 3236.13 ml. RLE from knee to groin (E-G) measures 5785.05 ml. RLE full leg (A-G) limb volume measures 9021.18 ml.      Left Lower Extremity Lymphedema   Other  LLE limb volume from ankle to tibial tuberosity (A-D) measures 3407.94 ml. LLE limb volume from knee to groin (E-G) measures 6133.95 ml. LLE full leg (A-G) limb volume measures 9341.88 ml.     Other  Limb volume differential for A-D measures 5.37%, L>R, for E-G equals 7.76%, L>R, and LVD for full leg measures 6.03%, L>R.              OT Treatments/Exercises (OP) - 09/02/18 0001      ADLs   ADL Education Given  Yes      Manual Therapy   Manual Therapy  Edema management;Compression Bandaging    Manual therapy comments  initial comparative limb volumetrics    Edema Management  initial Lymphedema Life Impact Scale    Compression Bandaging  LLE compression wraps using Rosidal foam over cotton stockinett, than 8 and 10 cm short stretch wraps in cuircumferentail gradient config.                  OT Long Term Goals - 09/02/18 1635      OT LONG TERM GOAL #1   Title  Pt will demonstrate understanding of lymphedema (LE) precautions / prevention principals, including signs / symptoms of cellulitis infection with modified independence using LE Workbook as printed reference to identify 6 precautions without verbal cues by end of 3rd  OT Rx visit.   (Pended)     Baseline  Max A  (Pended)     Time  3  (Pended)     Period  Days  (Pended)     Status  New  (Pended)     Target Date  --  (Pended)    3rd OT treatment visit     OT LONG TERM GOAL #2   Title  Pt will be able to  apply knee-length, multi-layer, short stretch compression wraps using correct gradient techniques with moderate  caregiver assistance to achieve optimal limb volume reduction during Intensive Phase CDT, and to return affected limb(s) as closely as possible, to premorbid size and shape.  (Pended)     Baseline  Max A  (Pended)     Time  2  (Pended)     Period  Weeks  (Pended)     Status  New  (Pended)     Target Date  --  (Pended)    4-5th OT Rx visit  OT LONG TERM GOAL #3   Title  Pt to achieve at least  10% limb volume reduction in affected limb(s)  bilaterally during Intensive Phase CDT to control limb swelling, to improve tissue integrity and immune function, to improve ADLs performance and to improve functional mobility/ transfer, and to improve body image and self-esteem.  (Pended)     Baseline  dependent  (Pended)     Time  12  (Pended)     Period  Weeks  (Pended)     Status  New  (Pended)     Target Date  11/29/18  (Pended)       OT LONG TERM GOAL #4   Title  Pt will achieve 100% compliance with daily LE self-care home program components, including daily  skin care, simple self-MLD, gradient compression wraps/ compression garments/devices, and therapeutic exercise with Mod CG support to ensure optimal Intensive Phase limb volume reduction to expedite compression garment/ device fitting.  (Pended)     Baseline  dependent  (Pended)     Time  12  (Pended)     Period  Weeks  (Pended)     Status  New  (Pended)     Target Date  11/29/18  (Pended)       OT LONG TERM GOAL #5   Title  Pt will use appropriate  assistive devices during LE self-care training to don compression garments/devices with modified independence to ensure optimal LE self-management over time and to limit progression of chronic LE.  (Pended)     Baseline  Max A  (Pended)     Time  12  (Pended)     Period  Weeks  (Pended)     Status  New  (Pended)       Long Term Additional Goals   Additional Long Term Goals  Yes   (Pended)       OT LONG TERM GOAL #6   Title  Pt will retain optimal limb volume reductions achieved during Intensive Phase CDT with no more than 3% volume increase with ongoing CG assistance to limit LE progression and further functional decline.  (Pended)     Baseline  Max A  (Pended)     Time  6  (Pended)     Period  Months  (Pended)     Status  New  (Pended)     Target Date  03/01/19  (Pended)             Plan - 09/02/18 1713    Clinical Impression Statement  Completed initial BLE compararative limb volumetrics. RLE limb volume from ankle to tibial tuberosity (A-D) measures 3236.13 ml. RLE from knee to groin (E-G) measures 5785.05 ml. RLE full leg (A-G) limb volume measures 9021.18 ml. LLE limb volume from ankle to tibial tuberosity (A-D) measures 3407.94 ml. LLE limb volume from knee to groin (E-G) measures 6133.95 ml. LLE full leg (A-G) limb volume measures 9341.88 ml. Limb volume differential for A-D measures 5.37%, L>R, for E-G equals 7.76%, L>R, and LVD for full leg measures 6.03%, L>R. Completed initial Lymphedema Life Impact Scale (LLIS) to determine peceived  percentage of disability Prt experienced as a resulty of lymphedema. Pt's initial core equals 50%. Remainder of visit dedicated to Pt and family edu for  gradient compression wrap application. We'll continue edu for wrapping next session.     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   Rehab Potential  Good    OT Frequency  3x / week    OT Duration  12 weeks    OT Treatment/Interventions  Self-care/ADL training;Therapeutic exercise;DME and/or AE instruction;Therapist, nutritional;Compression bandaging;Balance training;Other (comment);Manual Therapy;Coping strategies training;Patient/family education;Therapeutic activities;Manual lymph drainage;Energy conservation    skin care with low ph lotion   Clinical Decision Making  Several treatment options, min-mod task modification necessary    OT Home Exercise Plan  lymphatic pumping ther ex    Recommended Other Services  fit w/ ccl 2 knee length compression stockings and convoluted HOS devices to decrease fibrosis    Consulted and Agree with Plan of Care  Patient;Family member/caregiver    Family Member Consulted  spouse       Patient will benefit from skilled therapeutic intervention in order to improve the following deficits and impairments:  Abnormal gait, Decreased balance, Decreased mobility, Decreased skin integrity, Difficulty walking, Decreased range of motion, Increased edema, Pain, Decreased activity tolerance, Decreased knowledge of use of DME, Impaired flexibility  Visit Diagnosis: Lymphedema, not elsewhere classified    Problem List Patient Active Problem List   Diagnosis Date Noted  . Senile purpura ([place]) 07/28/2018  . Chronic venous insufficiency 07/14/2018  . Lymphedema 07/14/2018  . Aneurysm of descending thoracic aorta (HCC) 05/04/2018  . Allergic rhinitis 02/16/2018  . Bruising 02/16/2018  . Superficial bruising of arm, right, initial encounter 02/01/2018  . Muscle spasm 02/01/2018  . Aortic atherosclerosis ([place]) 01/13/2018  . Status post placement of implantable loop recorder 01/12/2018  . Left-sided weakness 01/12/2018  . Chronic kidney disease, stage 3 ([place]) 01/12/2018  . Cerebrovascular accident (CVA) due to embolism of precerebral artery ([place])   . TIA (transient ischemic attack) 01/04/2018  . Osteoarthritis of wrist 12/09/2017  . Piriformis syndrome, left 11/25/2017  . CAD (coronary artery disease) 11/25/2017  . Caregiver stress 11/25/2017  . Menopause 08/14/2017  . Abnormal weight gain 08/14/2017  . De Quervain's tenosynovitis, right 07/17/2017  . Stress incontinence 06/19/2017  . Personal history of tobacco use, presenting hazards to health 04/01/2017  . Anxiety  03/13/2017  . Glossitis 11/14/2016  . Advanced care planning/counseling discussion 10/17/2016  . Other fecal abnormalities   . Benign neoplasm of sigmoid colon   . COPD (chronic obstructive pulmonary disease) ([place]) 10/17/2015  . Mass of upper inner quadrant of right breast 06/22/2015    Andrey Spearman, MS, OTR/L, [facility]  09/02/18 5:35 PM  [place] MAIN [facility] SERVICES [address] [place], [name], [address] Phone: [phone]   Fax:  [phone]  Name: Sharon [name] MRN: [id number] Date of Birth: [birth year]

## 2018-09-06 ENCOUNTER — Encounter: Payer: Medicare Other | Admitting: Occupational Therapy

## 2018-09-07 ENCOUNTER — Ambulatory Visit: Payer: Medicare Other | Admitting: Occupational Therapy

## 2018-09-07 ENCOUNTER — Ambulatory Visit: Payer: Medicare Other | Admitting: Cardiovascular Disease

## 2018-09-07 ENCOUNTER — Encounter: Payer: Self-pay | Admitting: Nurse Practitioner

## 2018-09-07 ENCOUNTER — Ambulatory Visit (INDEPENDENT_AMBULATORY_CARE_PROVIDER_SITE_OTHER): Payer: Medicare Other | Admitting: Nurse Practitioner

## 2018-09-07 DIAGNOSIS — I890 Lymphedema, not elsewhere classified: Secondary | ICD-10-CM

## 2018-09-07 DIAGNOSIS — I6310 Cerebral infarction due to embolism of unspecified precerebral artery: Secondary | ICD-10-CM

## 2018-09-07 NOTE — Progress Notes (Signed)
BP 114/72   Pulse 88   Temp 98.7 F (37.1 C) (Oral)   Wt 215 lb (97.5 kg) Comment: Pt reported, refused to weigh  LMP  (LMP Unknown)   SpO2 97%   BMI 31.75 kg/m    Subjective:    Patient ID: Sharon [name], female    DOB: 1946-07-31, 72 y.o.   MRN: [id number]  HPI: Sharon [name] is a 71 y.o. female presents for follow-up lymphedema  Chief Complaint  Patient presents with  . Follow-up   LYMPHEDEMA: She goes to outpatient rehab, [facility], twice a week for therapy and wrap placement.  Currently they are working on left leg, as the edema was greater in this leg.  Reports some improvement.  Has been cutting back on fluid intake.  Sees Dr. Rockey Situ tomorrow.  Social History   Socioeconomic History  . Marital status: Married    Spouse name: Not on file  . Number of children: Not on file  . Years of education: Not on file  . Highest education level: Not on file  Occupational History  . Occupation: retired  Scientific laboratory technician  . Financial resource strain: Not hard at all  . Food insecurity:    Worry: Never true    Inability: Never true  . Transportation needs:    Medical: No    Non-medical: No  Tobacco Use  . Smoking status: Former Smoker    Packs/day: 0.60    Years: 56.00    Pack years: 33.60    Types: Cigarettes    Last attempt to quit: 07/14/2016    Years since quitting: 2.1  . Smokeless tobacco: Never Used  . Tobacco comment: has smoked off and on  Substance and Sexual Activity  . Alcohol use: Yes    Alcohol/week: 0.0 standard drinks    Comment: occasionally - special occasions  . Drug use: No  . Sexual activity: Not Currently    Birth control/protection: Post-menopausal  Lifestyle  . Physical activity:    Days per week: 7 days    Minutes per session: 10 min  . Stress: Very much  Relationships  . Social connections:    Talks on phone: More than three times a week    Gets together: Once a week    Attends religious service: 1 to 4 times per year    Active  member of club or organization: No    Attends meetings of clubs or organizations: Never    Relationship status: Married  . Intimate partner violence:    Fear of current or ex partner: No    Emotionally abused: No    Physically abused: No    Forced sexual activity: No  Other Topics Concern  . 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 Interim medical history since our last visit reviewed. Allergies and medications reviewed and updated.  Review of Systems  Constitutional: Negative for activity change, appetite change, fatigue and fever.  Respiratory: Negative for cough, chest tightness, shortness of breath and wheezing.   Cardiovascular: Positive for leg swelling. Negative for chest pain and palpitations.  Gastrointestinal: Negative for abdominal distention, abdominal pain, constipation, diarrhea, nausea and vomiting.  Neurological: Negative for dizziness, weakness, numbness and headaches.    Per HPI unless specifically indicated above     Objective:    BP 114/72   Pulse 88   Temp  98.7 F (37.1 C) (Oral)   Wt 215 lb (97.5 kg) Comment: Pt reported, refused to weigh  LMP  (LMP Unknown)   SpO2 97%   BMI 31.75 kg/m   Wt Readings from Last 3 Encounters:  09/07/18 215 lb (97.5 kg)  08/11/18 216 lb (98 kg)  08/09/18 216 lb (98 kg)    Physical Exam  Constitutional: She is oriented to person, place, and time. She appears well-developed and well-nourished.  HENT:  Head: Normocephalic.  Eyes: Pupils are equal, round, and reactive to light. Conjunctivae and EOM are normal. Right eye exhibits no discharge. Left eye exhibits no discharge.  Neck: Normal range of motion. Neck supple. No JVD present. Carotid bruit is not present. No thyromegaly present.  Cardiovascular: Normal rate, regular rhythm and normal heart sounds.  Pulmonary/Chest: Effort normal and  breath sounds normal.  Abdominal: Soft. Bowel sounds are normal.  Musculoskeletal: She exhibits edema.  Edema BLE.  ACE wrap to left lower extremity.  RLE 2 + edema.    Lymphadenopathy:    She has no cervical adenopathy.  Neurological: She is alert and oriented to person, place, and time.  Skin: Skin is warm and dry.  Psychiatric: She has a normal mood and affect. Her behavior is normal. Judgment and thought content normal.  Nursing note and vitals reviewed.   Results for orders placed or performed in visit on 04/07/93  Basic Metabolic Panel (BMET)  Result Value Ref Range   Glucose 86 65 - 99 mg/dL   BUN 13 8 - 27 mg/dL   Creatinine, Ser 0.95 0.57 - 1.00 mg/dL   GFR calc non Af Amer 60 >59 mL/min/1.73   GFR calc Af Amer 69 >59 mL/min/1.73   BUN/Creatinine Ratio 14 12 - 28   Sodium 144 134 - 144 mmol/L   Potassium 4.6 3.5 - 5.2 mmol/L   Chloride 101 96 - 106 mmol/L   CO2 25 20 - 29 mmol/L   Calcium 9.3 8.7 - 10.3 mg/dL  CBC with Differential/Platelet  Result Value Ref Range   WBC 7.1 3.4 - 10.8 x10E3/uL   RBC 4.79 3.77 - 5.28 x10E6/uL   Hemoglobin 14.4 11.1 - 15.9 g/dL   Hematocrit 41.9 34.0 - 46.6 %   MCV 88 79 - 97 fL   MCH 30.1 26.6 - 33.0 pg   MCHC 34.4 31.5 - 35.7 g/dL   RDW 12.6 12.3 - 15.4 %   Platelets 256 150 - 450 x10E3/uL   Neutrophils 73 Not Estab. %   Lymphs 17 Not Estab. %   Monocytes 10 Not Estab. %   Eos 0 Not Estab. %   Basos 0 Not Estab. %   Neutrophils Absolute 5.1 1.4 - 7.0 x10E3/uL   Lymphocytes Absolute 1.2 0.7 - 3.1 x10E3/uL   Monocytes Absolute 0.7 0.1 - 0.9 x10E3/uL   EOS (ABSOLUTE) 0.0 0.0 - 0.4 x10E3/uL   Basophils Absolute 0.0 0.0 - 0.2 x10E3/uL   Immature Granulocytes 0 Not Estab. %   Immature Grans (Abs) 0.0 0.0 - 0.1 x10E3/uL      Assessment & Plan:   Problem List Items Addressed This Visit      Other   Lymphedema    Recent labs from October, will obtain repeat labs next visit.  Continue to follow with therapy.             Time: 25 minutes, >50% spent counseling and discussing chronic illnesses.  Plan of care was fully discussed with the patient.  All aspects  of care were provided education on, as well as discussion on risks/benefits.  Patient was able to verbalize plan of care back to provider and agrees with the plan of care at this time.  Encouraged patient to notify provider if any questions or concerns are present.  Follow up plan: Return in about 3 months (around 12/08/2018).

## 2018-09-07 NOTE — Progress Notes (Signed)
Cardiology Office Note Date:  09/08/2018  Patient ID:  Sharon, [name] [birth year], MRN [id number] PCP:  [name], NP  Cardiologist:  Dr. Rockey Situ, MD Electrophysiologist: Dr. Caryl Comes, MD     Chief Complaint: Follow-up  History of Present Illness: Sharon [name] is a 72 y.o. female with history of embolic stroke in 11/5425, asthma, aortic and coronary atherosclerosis, hyperlipidemia, COPD secondary to prior tobacco abuse quitting approximately 2 years prior with a 33-pack-year history, lymphedema, cervical cancer, and anxiety who presents for follow-up of loop recorder implantation.  Patient was admitted to [facility] in 12/2017 with slurred speech and weakness involving the left hand with associated facial droop.  She was found to have multiple right cerebral infarcts concerning for embolic phenomena.  Carotid artery ultrasound showed minor carotid atherosclerosis with no hemodynamically significant internal carotid artery stenosis.  Transthoracic echocardiogram on 01/05/2018 showed an EF of 70% with no regional wall motion normalities.  No cardiac source of emboli was identified.  TEE on 01/06/2018 showed an EF of 55 to 65%, no regional wall motion normalities, no left atrial appendage thrombus.  No cardiac source of emboli was identified.  Normal-sized aortic root, ascending aorta, aortic arch, and descending thoracic aorta.  She underwent loop recorder implantation on 01/07/2018.  She established with Dr. Rockey Situ on 01/13/2018 and noted she had approximately 75% left lower extremity strength and continued to have residual left hand weakness.  There was also some vertigo-like symptoms.  Since device implantation she has not had any evidence of atrial fibrillation.  She has been evaluated by vascular surgery for lower extremity pain.  Lower extremity venous ultrasound was negative for DVT.  Lower extremity ABIs were normal bilaterally.  Labs: 07/2018 - CBC unremarkable, serum creatinine 0.95,  potassium 4.6 04/2018 - TSH normal, LFT normal 12/2017 - LDL 95, A1c 5.7  She comes in doing well from a cardiac perspective.  She has noted significant weight gain since she was last seen in 01/2018 with a current weight of 215 pounds.  This is 27 pounds up since she was last seen.  This is in the setting of being diagnosed with lymphedema which is being managed by the vascular surgeon's office.  She also indicates she has been significantly more sedentary since her stroke.  She does try to eat a healthy diet though does enjoy cheeses and chocolates.  She has stable 2-3 pillow orthopnea secondary to postnasal drip.  She denies any early satiety.  No chest pain or palpitations.  She continues to improve from her stroke.  She notes easy bruising while being on both Plavix and aspirin.  Past Medical History:  Diagnosis Date  . Anxiety   . Arthritis    hands, upper back  . Asthma   . COPD (chronic obstructive pulmonary disease) ([place])   . History of cervical cancer   . Menopausal disorder   . Osteoporosis   . Pneumonia 1960  . Spasm of abdominal muscles of right side    intermittent  . TMJ (dislocation of temporomandibular joint)   . Wears dentures    partial lower    Past Surgical History:  Procedure Laterality Date  . ABDOMINAL HYSTERECTOMY  1970's  . bladder botox  2005  . BLADDER SUSPENSION  2004  . CATARACT EXTRACTION W/PHACO Right 03/09/2018   Procedure: CATARACT EXTRACTION PHACO AND INTRAOCULAR LENS PLACEMENT ([place]) right;  Surgeon: Eulogio Bear, MD;  Location: [place];  Service: Ophthalmology;  Laterality: Right;  CALL  CELL 1ST  . CATARACT EXTRACTION W/PHACO Left 04/19/2018   Procedure: CATARACT EXTRACTION PHACO AND INTRAOCULAR LENS PLACEMENT ([facility])  LEFT;  Surgeon: Eulogio Bear, MD;  Location: [place];  Service: Ophthalmology;  Laterality: Left;  . COLONOSCOPY WITH PROPOFOL N/A 12/13/2015   Procedure: COLONOSCOPY WITH PROPOFOL;  Surgeon: Lucilla Lame,  MD;  Location: [place];  Service: Endoscopy;  Laterality: N/A;  . LOOP RECORDER INSERTION N/A 01/07/2018   Procedure: LOOP RECORDER INSERTION;  Surgeon: Deboraha Sprang, MD;  Location: [place] [facility];  Service: Cardiovascular;  Laterality: N/A;  . POLYPECTOMY N/A 12/13/2015   Procedure: POLYPECTOMY;  Surgeon: Lucilla Lame, MD;  Location: [place];  Service: Endoscopy;  Laterality: N/A;  SIGMOID COLON POLYPS X  5  . SHOULDER ARTHROSCOPY W/ ROTATOR CUFF REPAIR Right 1998  . TEE WITHOUT CARDIOVERSION N/A 01/06/2018   Procedure: TRANSESOPHAGEAL ECHOCARDIOGRAM (TEE);  Surgeon: Minna Merritts, MD;  Location: [facility];  Service: Cardiovascular;  Laterality: N/A;  . TONSILLECTOMY AND ADENOIDECTOMY      Current Meds  Medication Sig  . albuterol (PROAIR HFA) 108 (90 Base) MCG/ACT inhaler Inhale 2 puffs into the lungs every 6 (six) hours as needed for wheezing or shortness of breath.  Marland Kitchen aspirin EC 81 MG tablet Take 81 mg by mouth every other day.   Marland Kitchen atorvastatin (LIPITOR) 40 MG tablet TAKE 1 TABLET BY MOUTH ONCE A DAY AT 6 PM  . b complex vitamins capsule Take 1 capsule by mouth daily.  . Cholecalciferol (VITAMIN D) 2000 units tablet Take 2,000 Units by mouth daily.  . citalopram (CELEXA) 20 MG tablet Take 1 tablet (20 mg total) by mouth daily. (Patient taking differently: Take 20 mg by mouth daily. PRN)  . clopidogrel (PLAVIX) 75 MG tablet TAKE 1 TABLET BY MOUTH ONCE A DAY  . Cyanocobalamin 1000 MCG/ML KIT Inject 1,000 mcg as directed every 30 (thirty) days.  . Emollient (EUCERIN) lotion Apply topically as needed for dry skin.  Marland Kitchen estradiol (VIVELLE-DOT) 0.1 MG/24HR patch APPLY 1 PATCH ONTO THE SKIN 2 TIMES A WEEK  . loratadine (CLARITIN) 10 MG tablet Take 10 mg by mouth daily.  . Multiple Vitamin (MULTIVITAMIN) tablet Take 2 tablets by mouth daily.   . pantoprazole (PROTONIX) 40 MG tablet Take 1 tablet (40 mg total) by mouth daily.  . SYMBICORT 160-4.5 MCG/ACT inhaler USE  2 PUFFS TWICE DAILY  . Tiotropium Bromide Monohydrate (SPIRIVA RESPIMAT) 2.5 MCG/ACT AERS Inhale 2 puffs into the lungs daily.   Current Facility-Administered Medications for the 09/08/18 encounter (Office Visit) with Rise Mu, PA-C  Medication  . cyanocobalamin ((VITAMIN B-12)) injection 1,000 mcg    Allergies:   Baclofen; Librium [chlordiazepoxide]; Bactrim [sulfamethoxazole-trimethoprim]; Ibuprofen; Latex; Naprosyn [naproxen]; and Other   Social History:  The patient  reports that she quit smoking about 2 years ago. Her smoking use included cigarettes. She has a 33.60 pack-year smoking history. She has never used smokeless tobacco. She reports that she drinks alcohol. She reports that she does not use drugs.   Family History:  The patient's family history includes Diabetes in her mother; Heart disease in her mother; Stroke in her maternal grandmother and mother.  ROS:   Review of Systems  Constitutional: Positive for malaise/fatigue. Negative for chills, diaphoresis, fever and weight loss.       Weight gain  HENT: Negative for congestion.   Eyes: Negative for discharge and redness.  Respiratory: Positive for shortness of breath. Negative for cough, hemoptysis,  sputum production and wheezing.   Cardiovascular: Positive for leg swelling. Negative for chest pain, palpitations, orthopnea, claudication and PND.  Gastrointestinal: Negative for abdominal pain, blood in stool, heartburn, melena, nausea and vomiting.  Genitourinary: Negative for hematuria.  Musculoskeletal: Negative for falls and myalgias.  Skin: Negative for rash.  Neurological: Positive for sensory change, focal weakness and weakness. Negative for dizziness, tingling, tremors, speech change and loss of consciousness.  Psychiatric/Behavioral: Negative for substance abuse. The patient is nervous/anxious.   All other systems reviewed and are negative.    PHYSICAL EXAM:  VS:  BP 124/66 (BP Location: Left Arm, Patient  Position: Sitting, Cuff Size: Normal)   Pulse 79   Ht 5' 9"  (1.753 m)   Wt 215 lb (97.5 kg)   LMP  (LMP Unknown)   BMI 31.75 kg/m  BMI: Body mass index is 31.75 kg/m.  Physical Exam  Constitutional: She is oriented to person, place, and time. She appears well-developed and well-nourished.  HENT:  Head: Normocephalic and atraumatic.  Eyes: Right eye exhibits no discharge. Left eye exhibits no discharge.  Neck: Normal range of motion. No JVD present.  Cardiovascular: Normal rate, regular rhythm, S1 normal, S2 normal and normal heart sounds. Exam reveals no distant heart sounds, no friction rub, no midsystolic click and no opening snap.  No murmur heard. Pulses:      Posterior tibial pulses are 2+ on the right side, and 2+ on the left side.  Pulmonary/Chest: Effort normal and breath sounds normal. No respiratory distress. She has no decreased breath sounds. She has no wheezes. She has no rales. She exhibits no tenderness.  Abdominal: Soft. She exhibits no distension. There is no tenderness.  Musculoskeletal: She exhibits edema.  1-2+ bilateral lower extremity pitting edema to the knees  Neurological: She is alert and oriented to person, place, and time.  Skin: Skin is warm and dry. Bruising and petechiae noted. No cyanosis. Nails show no clubbing.  Psychiatric: She has a normal mood and affect. Her speech is normal and behavior is normal. Judgment and thought content normal.     EKG:  Was ordered and interpreted by me today. Shows NSR, 79 bpm, low voltage QRS, normal axis, no acute ST-T changes  Recent Labs: 05/04/2018: ALT 30; TSH 1.270 07/28/2018: BUN 13; Creatinine, Ser 0.95; Hemoglobin 14.4; Platelets 256; Potassium 4.6; Sodium 144  01/08/2018: Cholesterol 158; HDL 54; LDL Cholesterol 95; Total CHOL/HDL Ratio 2.9; Triglycerides 45; VLDL 9   CrCl cannot be calculated (Patient's most recent lab result is older than the maximum 21 days allowed.).   Wt Readings from Last 3  Encounters:  09/08/18 215 lb (97.5 kg)  09/07/18 215 lb (97.5 kg)  08/11/18 216 lb (98 kg)     Other studies reviewed: Additional studies/records reviewed today include: summarized above  ASSESSMENT AND PLAN:  1. CVA due to suspected embolic phenomena: Residual deficits continue to slowly improve.  Remains on aspirin and Plavix per neurology recommendations.  Aggressive risk factor modification including optimal blood pressure and lipid control.  Continue to monitor loop recorder for evidence of atrial arrhythmia that could predispose patient to cardioembolic phenomena.  2. Status post loop recorder: No A. fib noted since implantation.  Device appears to be functioning normally.  Followed by EP.  3. Aortic and coronary artery atherosclerosis: Goal LDL less than 70 as outlined below.  Remains on Lipitor 40 mg daily at this time.  Recheck lipid and direct LDL with liver function as below.  Primary prevention.  4. Weight gain/lower extremity swelling: Likely in the setting of her worsening lymphedema, which is being followed by vascular surgery.  Cannot exclude some component of increased sedentary lifestyle as well.  Check BNP and echo to evaluate for potential cardiomyopathy.  Recommend compression stockings as directed by vascular surgery as well as leg elevation.  Check CMP TSH, and CBC.  5. Hyperlipidemia: Most recent LDL of 95 from 12/2017.  Remains on Lipitor 40 mg daily.  Goal LDL less than 70.  Check lipid panel, direct LDL, and liver function.  Recommend escalation of Lipitor to 80 mg daily if LDL is not at goal.  6. COPD: Stable and without active exacerbation.  Followed by PCP.  7. Easy bruising/petechiae: Check CBC.  Defer management of aspirin and Plavix to PCP/neurology.  Disposition: F/u with Dr. Rockey Situ or an APP in 3 months.  Current medicines are reviewed at length with the patient today.  The patient did not have any concerns regarding medicines.  Signed, Christell Faith,  PA-C 09/08/2018 1:26 PM     [address] [place], [place] [id number] [phone]

## 2018-09-07 NOTE — Assessment & Plan Note (Signed)
Recent labs from October, will obtain repeat labs next visit.  Continue to follow with therapy.

## 2018-09-07 NOTE — Patient Instructions (Signed)

## 2018-09-08 ENCOUNTER — Encounter: Payer: Self-pay | Admitting: Physician Assistant

## 2018-09-08 ENCOUNTER — Ambulatory Visit (INDEPENDENT_AMBULATORY_CARE_PROVIDER_SITE_OTHER): Payer: Medicare Other | Admitting: Physician Assistant

## 2018-09-08 ENCOUNTER — Other Ambulatory Visit
Admission: RE | Admit: 2018-09-08 | Discharge: 2018-09-08 | Disposition: A | Discharge status: Home / Self Care | Payer: Medicare Other | Source: Ambulatory Visit | Attending: Physician Assistant | Admitting: Physician Assistant

## 2018-09-08 VITALS — BP 124/66 | HR 79 | Ht 69.0 in | Wt 215.0 lb

## 2018-09-08 DIAGNOSIS — M7989 Other specified soft tissue disorders: Secondary | ICD-10-CM

## 2018-09-08 DIAGNOSIS — I639 Cerebral infarction, unspecified: Secondary | ICD-10-CM | POA: Diagnosis not present

## 2018-09-08 DIAGNOSIS — R0602 Shortness of breath: Secondary | ICD-10-CM | POA: Diagnosis not present

## 2018-09-08 DIAGNOSIS — E785 Hyperlipidemia, unspecified: Secondary | ICD-10-CM | POA: Diagnosis not present

## 2018-09-08 DIAGNOSIS — R635 Abnormal weight gain: Secondary | ICD-10-CM | POA: Diagnosis not present

## 2018-09-08 DIAGNOSIS — I700 Atherosclerosis of aorta: Secondary | ICD-10-CM | POA: Diagnosis not present

## 2018-09-08 DIAGNOSIS — J449 Chronic obstructive pulmonary disease, unspecified: Secondary | ICD-10-CM

## 2018-09-08 DIAGNOSIS — Z95818 Presence of other cardiac implants and grafts: Secondary | ICD-10-CM | POA: Diagnosis not present

## 2018-09-08 DIAGNOSIS — R233 Spontaneous ecchymoses: Secondary | ICD-10-CM | POA: Diagnosis not present

## 2018-09-08 DIAGNOSIS — R238 Other skin changes: Secondary | ICD-10-CM

## 2018-09-08 DIAGNOSIS — I2510 Atherosclerotic heart disease of native coronary artery without angina pectoris: Secondary | ICD-10-CM

## 2018-09-08 LAB — COMPREHENSIVE METABOLIC PANEL
ALT: 22 U/L (ref 0–44)
AST: 24 U/L (ref 15–41)
Albumin: 3.9 g/dL (ref 3.5–5.0)
Alkaline Phosphatase: 73 U/L (ref 38–126)
Anion gap: 10 (ref 5–15)
BUN: 14 mg/dL (ref 8–23)
CHLORIDE: 104 mmol/L (ref 98–111)
CO2: 28 mmol/L (ref 22–32)
CREATININE: 0.88 mg/dL (ref 0.44–1.00)
Calcium: 8.8 mg/dL — ABNORMAL LOW (ref 8.9–10.3)
GFR calc Af Amer: >60 mL/min (ref >60)
GFR calc non Af Amer: >60 mL/min (ref >60)
Glucose, Bld: 117 mg/dL — ABNORMAL HIGH (ref 70–99)
Potassium: 4.3 mmol/L (ref 3.5–5.1)
SODIUM: 142 mmol/L (ref 135–145)
Total Bilirubin: 0.8 mg/dL (ref 0.3–1.2)
Total Protein: 6.8 g/dL (ref 6.5–8.1)

## 2018-09-08 LAB — CBC
HEMATOCRIT: 43 % (ref 36.0–46.0)
Hemoglobin: 13.7 g/dL (ref 12.0–15.0)
MCH: 29.9 pg (ref 26.0–34.0)
MCHC: 31.9 g/dL (ref 30.0–36.0)
MCV: 93.9 fL (ref 80.0–100.0)
Platelets: 219 10*3/uL (ref 150–400)
RBC: 4.58 MIL/uL (ref 3.87–5.11)
RDW: 13.4 % (ref 11.5–15.5)
WBC: 7 10*3/uL (ref 4.0–10.5)
nRBC: 0 % (ref 0.0–0.2)

## 2018-09-08 LAB — TSH: TSH: 1.526 u[IU]/mL (ref 0.350–4.500)

## 2018-09-08 LAB — BRAIN NATRIURETIC PEPTIDE: B NATRIURETIC PEPTIDE 5: 51 pg/mL (ref 0.0–100.0)

## 2018-09-08 NOTE — Patient Instructions (Signed)
Medication Instructions:  No changes  If you need a refill on your cardiac medications before your next appointment, please call your pharmacy.   Lab work: Your provider would like for you to have the following labs today: CMET, TSH, CBC, and BNP at the [facility].   If you have labs (blood work) drawn today and your tests are completely normal, you will receive your results only by: Marland Kitchen MyChart Message (if you have MyChart) OR . A paper copy in the mail If you have any lab test that is abnormal or we need to change your treatment, we will call you to review the results.  Testing/Procedures: Your physician has requested that you have an echocardiogram. Echocardiography is a painless test that uses sound waves to create images of your heart. It provides your doctor with information about the size and shape of your heart and how well your heart's chambers and valves are working. You may receive an ultrasound enhancing agent through an IV if needed to better visualize your heart during the echo.This procedure takes approximately one hour. There are no restrictions for this procedure. This will take place at the [facility] clinic.    Follow-Up: At [facility], you and your health needs are our priority.  As part of our continuing mission to provide you with exceptional heart care, we have created designated Provider Care Teams.  These Care Teams include your primary Cardiologist (physician) and Advanced Practice Providers (APPs -  Physician Assistants and Nurse Practitioners) who all work together to provide you with the care you need, when you need it. You will need a follow up appointment in 3 months.  Please call our office 2 months in advance to schedule this appointment.  You may see Dr. Rockey Situ or one of the following Advanced Practice Providers on your designated Care Team:   Murray Hodgkins, NP Christell Faith, PA-C . Marrianne Mood, PA-C

## 2018-09-08 NOTE — Therapy (Signed)
[place] MAIN [facility] SERVICES [address] [place], [name], [address] Phone: [phone]   Fax:  [phone]  Occupational Therapy Treatment  Patient Details  Name: Sharon [name] MRN: [id number] Date of Birth: 03-04-1946 No data recorded  Encounter Date: 09/07/2018  OT End of Session - 09/07/18 1225    Visit Number  3    Number of Visits  36    Date for OT Re-Evaluation  11/29/18    OT Start Time  1107    OT Stop Time  1200    OT Time Calculation (min)  53 min    Activity Tolerance  Patient tolerated treatment well;No increased pain    Behavior During Therapy  WFL for tasks assessed/performed       Past Medical History:  Diagnosis Date  . Anxiety   . Arthritis    hands, upper back  . Asthma   . COPD (chronic obstructive pulmonary disease) ([place])   . History of cervical cancer   . Menopausal disorder   . Osteoporosis   . Pneumonia 1960  . Spasm of abdominal muscles of right side    intermittent  . TMJ (dislocation of temporomandibular joint)   . Wears dentures    partial lower    Past Surgical History:  Procedure Laterality Date  . ABDOMINAL HYSTERECTOMY  1970's  . bladder botox  2005  . BLADDER SUSPENSION  2004  . CATARACT EXTRACTION W/PHACO Right 03/09/2018   Procedure: CATARACT EXTRACTION PHACO AND INTRAOCULAR LENS PLACEMENT ([place]) right;  Surgeon: Eulogio Bear, MD;  Location: [place];  Service: Ophthalmology;  Laterality: Right;  CALL CELL 1ST  . CATARACT EXTRACTION W/PHACO Left 04/19/2018   Procedure: CATARACT EXTRACTION PHACO AND INTRAOCULAR LENS PLACEMENT (IOC)  LEFT;  Surgeon: Eulogio Bear, MD;  Location: [place];  Service: Ophthalmology;  Laterality: Left;  . COLONOSCOPY WITH PROPOFOL N/A 12/13/2015   Procedure: COLONOSCOPY WITH PROPOFOL;  Surgeon: Lucilla Lame, MD;  Location: [place];  Service: Endoscopy;  Laterality: N/A;  . LOOP RECORDER INSERTION N/A 01/07/2018   Procedure: LOOP RECORDER INSERTION;  Surgeon: Deboraha Sprang, MD;  Location: [place] [facility];  Service: Cardiovascular;  Laterality: N/A;  . POLYPECTOMY N/A 12/13/2015   Procedure: POLYPECTOMY;  Surgeon: Lucilla Lame, MD;  Location: [place];  Service: Endoscopy;  Laterality: N/A;  SIGMOID COLON POLYPS X  5  . SHOULDER ARTHROSCOPY W/ ROTATOR CUFF REPAIR Right 1998  . TEE WITHOUT CARDIOVERSION N/A 01/06/2018   Procedure: TRANSESOPHAGEAL ECHOCARDIOGRAM (TEE);  Surgeon: Minna Merritts, MD;  Location: [facility];  Service: Cardiovascular;  Laterality: N/A;  . TONSILLECTOMY AND ADENOIDECTOMY      There were no vitals filed for this visit.  Subjective Assessment - 09/07/18 1221    Subjective   [name] presemnts for OT Rx visit 3 / 36 to address BLE LE. Pt is accompanied by her spouse, [name], who will assist her with compression wraps between clinical sessions. Pt states  she had minimal difficulty tolerating compression wraps after last visit. Pt and spouse did not practice reapplying wraps.    Patient is accompained by:  Family member    Pertinent History  Hx anxiety, COPD, OA, Asthma, hx cervical ca, osteoporosis, hx pneumonia, hx CVA, hx LLE cellulitis, hx L leg wound, hx falls    Limitations  chronic BLE leg swelling and associated pain, decreased balance, fall risk, decreased standing, walking and sitting tolerance, difficulty w/ lower body bathing  and dressing, difficulty fitting LB clothing and shoes, unable to drive, impaired transfers and functional mobility, impaired social participation, impaired ability to perform household chores, cooking, meal prep    Repetition  Increases Symptoms    Special Tests  Dopler negative for DVT    Patient Stated Goals  reduce leg pain and swelling so I can do more    Currently in Pain?  Yes   leg pain/discomfort unchanged. Not rated numerically today   Pain Location  Leg    Pain Orientation  Right;Left    Pain Descriptors / Indicators   Heaviness;Tender;Discomfort;Tightness;Sore;Pressure;Tiring    Pain Type  Chronic pain                   OT Treatments/Exercises (OP) - 09/08/18 0001      ADLs   ADL Education Given  Yes      Manual Therapy   Manual Therapy  Edema management;Compression Bandaging    Compression Bandaging  LLE compression wraps using Rosidal foam over cotton stockinett, than 8 and 10 cm short stretch wraps in cuircumferentail gradient config.             OT Education - 09/08/18 1224    Education Details  Cont Pt and family edu for self-compression wrapping throughout session    Person(s) Educated  Patient;Spouse    Methods  Explanation;Demonstration;Handout;Tactile cues;Verbal cues    Comprehension  Verbalized understanding;Returned demonstration;Need further instruction;Verbal cues required;Tactile cues required          OT Long Term Goals - 09/02/18 1635      OT LONG TERM GOAL #1   Title  Pt will demonstrate understanding of lymphedema (LE) precautions / prevention principals, including signs / symptoms of cellulitis infection with modified independence using LE Workbook as printed reference to identify 6 precautions without verbal cues by end of 3rd  OT Rx visit.   (Pended)     Baseline  Max A  (Pended)     Time  3  (Pended)     Period  Days  (Pended)     Status  New  (Pended)     Target Date  --  (Pended)    3rd OT treatment visit     OT LONG TERM GOAL #2   Title  Pt will be able to apply knee-length, multi-layer, short stretch compression wraps using correct gradient techniques with moderate  caregiver assistance to achieve optimal limb volume reduction during Intensive Phase CDT, and to return affected limb(s) as closely as possible, to premorbid size and shape.  (Pended)     Baseline  Max A  (Pended)     Time  2  (Pended)     Period  Weeks  (Pended)     Status  New  (Pended)     Target Date  --  (Pended)    4-5th OT Rx visit     OT LONG TERM GOAL #3   Title  Pt  to achieve at least  10% limb volume reduction in affected limb(s)  bilaterally during Intensive Phase CDT to control limb swelling, to improve tissue integrity and immune function, to improve ADLs performance and to improve functional mobility/ transfer, and to improve body image and self-esteem.  (Pended)     Baseline  dependent  (Pended)     Time  12  (Pended)     Period  Weeks  (Pended)     Status  New  (Pended)     Target Date  11/29/18  (Pended)       OT LONG TERM GOAL #4   Title  Pt will achieve 100% compliance with daily LE self-care home program components, including daily  skin care, simple self-MLD, gradient compression wraps/ compression garments/devices, and therapeutic exercise with Mod CG support to ensure optimal Intensive Phase limb volume reduction to expedite compression garment/ device fitting.  (Pended)     Baseline  dependent  (Pended)     Time  12  (Pended)     Period  Weeks  (Pended)     Status  New  (Pended)     Target Date  11/29/18  (Pended)       OT LONG TERM GOAL #5   Title  Pt will use appropriate  assistive devices during LE self-care training to don compression garments/devices with modified independence to ensure optimal LE self-management over time and to limit progression of chronic LE.  (Pended)     Baseline  Max A  (Pended)     Time  12  (Pended)     Period  Weeks  (Pended)     Status  New  (Pended)       Long Term Additional Goals   Additional Long Term Goals  Yes  (Pended)       OT LONG TERM GOAL #6   Title  Pt will retain optimal limb volume reductions achieved during Intensive Phase CDT with no more than 3% volume increase with ongoing CG assistance to limit LE progression and further functional decline.  (Pended)     Baseline  Max A  (Pended)     Time  6  (Pended)     Period  Months  (Pended)     Status  New  (Pended)     Target Date  03/01/19  (Pended)             Plan - 09/08/18 1225    Clinical Impression Statement   Pt tolerated  compression wraps for 24 hours after last visit with minimal difficulty. Limb volume of LLE appears slightly decreased today by visual assessment. Emphasis of session was on Pt and family edu for LE self care- gradient compresssion wrap application using correct techniques. By end of session spouse was able too apply wraps correctly with Max A from OT. Pt was able to provide limited  spontaneous guidance  to [place], her spouse, during wrap application.  Cont as per POC.    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   Rehab Potential  Good    OT Frequency  3x / week    OT Duration  12 weeks    OT Treatment/Interventions  Self-care/ADL training;Therapeutic exercise;DME and/or AE instruction;Therapist, nutritional;Compression bandaging;Balance training;Other (comment);Manual Therapy;Coping strategies training;Patient/family education;Therapeutic activities;Manual lymph drainage;Energy conservation   skin care with low ph lotion   Clinical Decision Making  Several treatment options, min-mod task modification necessary    OT Home Exercise Plan  lymphatic pumping ther ex    Recommended Other Services  fit w/ ccl 2 knee length compression stockings and convoluted HOS devices to decrease fibrosis    Consulted and Agree with Plan of Care  Patient;Family member/caregiver    Family Member Consulted  spouse       Patient will benefit from skilled therapeutic intervention in order to improve the following deficits and impairments:  Abnormal gait, Decreased balance, Decreased mobility, Decreased  skin integrity, Difficulty walking, Decreased range of motion, Increased edema, Pain, Decreased activity tolerance, Decreased knowledge of use of DME, Impaired flexibility  Visit Diagnosis: Lymphedema, not elsewhere classified    Problem List Patient Active  Problem List   Diagnosis Date Noted  . Senile purpura ([place]) 07/28/2018  . Chronic venous insufficiency 07/14/2018  . Lymphedema 07/14/2018  . Aneurysm of descending thoracic aorta (HCC) 05/04/2018  . Allergic rhinitis 02/16/2018  . Aortic atherosclerosis ([place]) 01/13/2018  . Status post placement of implantable loop recorder 01/12/2018  . Left-sided weakness 01/12/2018  . Chronic kidney disease, stage 3 ([place]) 01/12/2018  . Cerebrovascular accident (CVA) due to embolism of precerebral artery ([place])   . TIA (transient ischemic attack) 01/04/2018  . Osteoarthritis of wrist 12/09/2017  . Piriformis syndrome, left 11/25/2017  . CAD (coronary artery disease) 11/25/2017  . Caregiver stress 11/25/2017  . Menopause 08/14/2017  . Abnormal weight gain 08/14/2017  . De Quervain's tenosynovitis, right 07/17/2017  . Stress incontinence 06/19/2017  . Personal history of tobacco use, presenting hazards to health 04/01/2017  . Anxiety 03/13/2017  . Glossitis 11/14/2016  . Advanced care planning/counseling discussion 10/17/2016  . Other fecal abnormalities   . Benign neoplasm of sigmoid colon   . COPD (chronic obstructive pulmonary disease) ([place]) 10/17/2015  . Mass of upper inner quadrant of right breast 06/22/2015    Andrey Spearman, MS, OTR/L, [facility] 09/08/18 12:31 PM  [place] MAIN [facility] SERVICES [address] [place], [name], [address] Phone: [phone]   Fax:  [phone]  Name: Sharon [name] MRN: [id number] Date of Birth: 01-May-1946

## 2018-09-13 ENCOUNTER — Encounter: Payer: Medicare Other | Admitting: Occupational Therapy

## 2018-09-14 ENCOUNTER — Ambulatory Visit: Payer: Medicare Other | Admitting: Cardiovascular Disease

## 2018-09-14 ENCOUNTER — Ambulatory Visit: Payer: Medicare Other | Attending: Family Medicine | Admitting: Occupational Therapy

## 2018-09-14 DIAGNOSIS — I890 Lymphedema, not elsewhere classified: Secondary | ICD-10-CM | POA: Insufficient documentation

## 2018-09-14 NOTE — Therapy (Signed)
[place] MAIN [facility] SERVICES [address] [place], [name], [address] Phone: [phone]   Fax:  [phone]  Occupational Therapy Treatment  Patient Details  Name: Sharon [name] MRN: [id number] Date of Birth: Jul 09, 1946 No data recorded  Encounter Date: 09/14/2018  OT End of Session - 09/14/18 1549    Visit Number  4    Number of Visits  36    Date for OT Re-Evaluation  11/29/18    OT Start Time  0210    OT Stop Time  0324    OT Time Calculation (min)  74 min    Activity Tolerance  Patient tolerated treatment well;No increased pain    Behavior During Therapy  WFL for tasks assessed/performed       Past Medical History:  Diagnosis Date  . Anxiety   . Arthritis    hands, upper back  . Asthma   . COPD (chronic obstructive pulmonary disease) ([place])   . History of cervical cancer   . Menopausal disorder   . Osteoporosis   . Pneumonia 1960  . Spasm of abdominal muscles of right side    intermittent  . TMJ (dislocation of temporomandibular joint)   . Wears dentures    partial lower    Past Surgical History:  Procedure Laterality Date  . ABDOMINAL HYSTERECTOMY  1970's  . bladder botox  2005  . BLADDER SUSPENSION  2004  . CATARACT EXTRACTION W/PHACO Right 03/09/2018   Procedure: CATARACT EXTRACTION PHACO AND INTRAOCULAR LENS PLACEMENT ([place]) right;  Surgeon: Eulogio Bear, MD;  Location: [place];  Service: Ophthalmology;  Laterality: Right;  CALL CELL 1ST  . CATARACT EXTRACTION W/PHACO Left 04/19/2018   Procedure: CATARACT EXTRACTION PHACO AND INTRAOCULAR LENS PLACEMENT (IOC)  LEFT;  Surgeon: Eulogio Bear, MD;  Location: [place];  Service: Ophthalmology;  Laterality: Left;  . COLONOSCOPY WITH PROPOFOL N/A 12/13/2015   Procedure: COLONOSCOPY WITH PROPOFOL;  Surgeon: Lucilla Lame, MD;  Location: [place];  Service: Endoscopy;  Laterality: N/A;  . LOOP RECORDER INSERTION N/A 01/07/2018   Procedure: LOOP RECORDER INSERTION;  Surgeon: Deboraha Sprang, MD;  Location: [place] [facility];  Service: Cardiovascular;  Laterality: N/A;  . POLYPECTOMY N/A 12/13/2015   Procedure: POLYPECTOMY;  Surgeon: Lucilla Lame, MD;  Location: [place];  Service: Endoscopy;  Laterality: N/A;  SIGMOID COLON POLYPS X  5  . SHOULDER ARTHROSCOPY W/ ROTATOR CUFF REPAIR Right 1998  . TEE WITHOUT CARDIOVERSION N/A 01/06/2018   Procedure: TRANSESOPHAGEAL ECHOCARDIOGRAM (TEE);  Surgeon: Minna Merritts, MD;  Location: [facility];  Service: Cardiovascular;  Laterality: N/A;  . TONSILLECTOMY AND ADENOIDECTOMY      There were no vitals filed for this visit.  Subjective Assessment - 09/14/18 1540    Subjective   Sharon [name] presemnts for OT Rx visit 4 / 36 to address BLE LE. Pt is accompanied by her spouse, Sharon [name].  Pt states she feels spouse did not do a good job with compression wraps during visit interval.    Patient is accompained by:  Family member    Pertinent History  Hx anxiety, COPD, OA, Asthma, hx cervical ca, osteoporosis, hx pneumonia, hx CVA, hx LLE cellulitis, hx L leg wound, hx falls    Limitations  chronic BLE leg swelling and associated pain, decreased balance, fall risk, decreased standing, walking and sitting tolerance, difficulty w/ lower body bathing and dressing, difficulty fitting LB clothing and shoes, unable to drive, impaired transfers  and functional mobility, impaired social participation, impaired ability to perform household chores, cooking, meal prep    Repetition  Increases Symptoms    Special Tests  Dopler negative for DVT    Patient Stated Goals  reduce leg pain and swelling so I can do more    Currently in Pain?  Yes   Leg pain unchanged by reoprt. Not rated numerically.                  OT Treatments/Exercises (OP) - 09/14/18 0001      ADLs   ADL Education Given  Yes      Manual Therapy   Manual Therapy  Edema management;Compression Bandaging;Manual  Lymphatic Drainage (MLD)    Edema Management  skin care to LLE with low ph [name] ( skin grade castor oil) throughuot MLD to increase skin hydration and mobilit     Manual Lymphatic Drainage (MLD)  MLD to LLE using functional L inguinal LN. Omitted lateral neck portion of "short neck sequence" due to hx of stroke. Modified neck sequence included clavicular strokes only. Pt activated deep abdominal lymphatics ising diaphragmatic breathing after skiilled instruction. Tulized inguinal LN and worked in proximal to distal sequence  from thigh to toes. Completed MLD with 3 full sequemces  from distal to proximal pathways in rhwetrograde fashion. Good tolerance.    Compression Bandaging  Extended compression wraps above knee today using 2 additiona 12 CM wraps in effort to mobilize medial thigh swelling towards inguinal LN.              OT Education - 09/14/18 1547    Education Details  Intro level Pt edu for MLD based on structure and function of LE system. Pt able to perform deep breathing techniques after skilled instruction. Pt able to verbalize precautions for omitting lateral neck portion of neck sequence due to hx of stroke. Cont Pt and fAMILY edu for gradient compression wrapping.    Person(s) Educated  Patient;Spouse    Methods  Explanation;Demonstration;Handout;Tactile cues;Verbal cues    Comprehension  Verbalized understanding;Returned demonstration;Need further instruction;Verbal cues required;Tactile cues required          OT Long Term Goals - 09/02/18 1635      OT LONG TERM GOAL #1   Title  Pt will demonstrate understanding of lymphedema (LE) precautions / prevention principals, including signs / symptoms of cellulitis infection with modified independence using LE Workbook as printed reference to identify 6 precautions without verbal cues by end of 3rd  OT Rx visit.   (Pended)     Baseline  Max A  (Pended)     Time  3  (Pended)     Period  Days  (Pended)     Status  New   (Pended)     Target Date  --  (Pended)    3rd OT treatment visit     OT LONG TERM GOAL #2   Title  Pt will be able to apply knee-length, multi-layer, short stretch compression wraps using correct gradient techniques with moderate  caregiver assistance to achieve optimal limb volume reduction during Intensive Phase CDT, and to return affected limb(s) as closely as possible, to premorbid size and shape.  (Pended)     Baseline  Max A  (Pended)     Time  2  (Pended)     Period  Weeks  (Pended)     Status  New  (Pended)     Target Date  --  (Pended)  4-5th OT Rx visit     OT LONG TERM GOAL #3   Title  Pt to achieve at least  10% limb volume reduction in affected limb(s)  bilaterally during Intensive Phase CDT to control limb swelling, to improve tissue integrity and immune function, to improve ADLs performance and to improve functional mobility/ transfer, and to improve body image and self-esteem.  (Pended)     Baseline  dependent  (Pended)     Time  12  (Pended)     Period  Weeks  (Pended)     Status  New  (Pended)     Target Date  11/29/18  (Pended)       OT LONG TERM GOAL #4   Title  Pt will achieve 100% compliance with daily LE self-care home program components, including daily  skin care, simple self-MLD, gradient compression wraps/ compression garments/devices, and therapeutic exercise with Mod CG support to ensure optimal Intensive Phase limb volume reduction to expedite compression garment/ device fitting.  (Pended)     Baseline  dependent  (Pended)     Time  12  (Pended)     Period  Weeks  (Pended)     Status  New  (Pended)     Target Date  11/29/18  (Pended)       OT LONG TERM GOAL #5   Title  Pt will use appropriate  assistive devices during LE self-care training to don compression garments/devices with modified independence to ensure optimal LE self-management over time and to limit progression of chronic LE.  (Pended)     Baseline  Max A  (Pended)     Time  12  (Pended)      Period  Weeks  (Pended)     Status  New  (Pended)       Long Term Additional Goals   Additional Long Term Goals  Yes  (Pended)       OT LONG TERM GOAL #6   Title  Pt will retain optimal limb volume reductions achieved during Intensive Phase CDT with no more than 3% volume increase with ongoing CG assistance to limit LE progression and further functional decline.  (Pended)     Baseline  Max A  (Pended)     Time  6  (Pended)     Period  Months  (Pended)     Status  New  (Pended)     Target Date  03/01/19  (Pended)               Patient will benefit from skilled therapeutic intervention in order to improve the following deficits and impairments:     Visit Diagnosis: Lymphedema, not elsewhere classified    Problem List Patient Active Problem List   Diagnosis Date Noted  . Senile purpura ([place]) 07/28/2018  . Chronic venous insufficiency 07/14/2018  . Lymphedema 07/14/2018  . Aneurysm of descending thoracic aorta (HCC) 05/04/2018  . Allergic rhinitis 02/16/2018  . Aortic atherosclerosis ([place]) 01/13/2018  . Status post placement of implantable loop recorder 01/12/2018  . Left-sided weakness 01/12/2018  . Chronic kidney disease, stage 3 ([place]) 01/12/2018  . Cerebrovascular accident (CVA) due to embolism of precerebral artery ([place])   . TIA (transient ischemic attack) 01/04/2018  . Osteoarthritis of wrist 12/09/2017  . Piriformis syndrome, left 11/25/2017  . CAD (coronary artery disease) 11/25/2017  . Caregiver stress 11/25/2017  . Menopause 08/14/2017  . Abnormal weight gain 08/14/2017  . De Quervain's tenosynovitis, right 07/17/2017  . Stress  incontinence 06/19/2017  . Personal history of tobacco use, presenting hazards to health 04/01/2017  . Anxiety 03/13/2017  . Glossitis 11/14/2016  . Advanced care planning/counseling discussion 10/17/2016  . Other fecal abnormalities   . Benign neoplasm of sigmoid colon   . COPD (chronic obstructive pulmonary disease) ([place])  10/17/2015  . Mass of upper inner quadrant of right breast 06/22/2015    Andrey Spearman, MS, OTR/L, [facility] 09/14/18 3:51 PM   [place] MAIN [facility] SERVICES [address] [place], [name], [address] Phone: [phone]   Fax:  [phone]  Name: EFFIE WAHLERT MRN: [id number] Date of Birth: 22-Nov-1945

## 2018-09-16 ENCOUNTER — Ambulatory Visit: Payer: Medicare Other | Admitting: Occupational Therapy

## 2018-09-16 DIAGNOSIS — I890 Lymphedema, not elsewhere classified: Secondary | ICD-10-CM | POA: Diagnosis not present

## 2018-09-16 NOTE — Therapy (Signed)
[place] MAIN [facility] SERVICES [address] [place], [name], [address] Phone: [phone]   Fax:  [phone]  Occupational Therapy Treatment  Patient Details  Name: Sharon [name] MRN: [id number] Date of Birth: 02-19-1946 No data recorded  Encounter Date: 09/16/2018  OT End of Session - 09/16/18 1705    Visit Number  5    Number of Visits  36    Date for OT Re-Evaluation  11/29/18    OT Start Time  0215    OT Stop Time  0315    OT Time Calculation (min)  60 min    Activity Tolerance  Patient tolerated treatment well;No increased pain    Behavior During Therapy  WFL for tasks assessed/performed       Past Medical History:  Diagnosis Date  . Anxiety   . Arthritis    hands, upper back  . Asthma   . COPD (chronic obstructive pulmonary disease) ([place])   . History of cervical cancer   . Menopausal disorder   . Osteoporosis   . Pneumonia 1960  . Spasm of abdominal muscles of right side    intermittent  . TMJ (dislocation of temporomandibular joint)   . Wears dentures    partial lower    Past Surgical History:  Procedure Laterality Date  . ABDOMINAL HYSTERECTOMY  1970's  . bladder botox  2005  . BLADDER SUSPENSION  2004  . CATARACT EXTRACTION W/PHACO Right 03/09/2018   Procedure: CATARACT EXTRACTION PHACO AND INTRAOCULAR LENS PLACEMENT ([place]) right;  Surgeon: Eulogio Bear, MD;  Location: [place];  Service: Ophthalmology;  Laterality: Right;  CALL CELL 1ST  . CATARACT EXTRACTION W/PHACO Left 04/19/2018   Procedure: CATARACT EXTRACTION PHACO AND INTRAOCULAR LENS PLACEMENT (IOC)  LEFT;  Surgeon: Eulogio Bear, MD;  Location: [place];  Service: Ophthalmology;  Laterality: Left;  . COLONOSCOPY WITH PROPOFOL N/A 12/13/2015   Procedure: COLONOSCOPY WITH PROPOFOL;  Surgeon: Lucilla Lame, MD;  Location: [place];  Service: Endoscopy;  Laterality: N/A;  . LOOP RECORDER INSERTION N/A 01/07/2018    Procedure: LOOP RECORDER INSERTION;  Surgeon: Deboraha Sprang, MD;  Location: [place] [facility];  Service: Cardiovascular;  Laterality: N/A;  . POLYPECTOMY N/A 12/13/2015   Procedure: POLYPECTOMY;  Surgeon: Lucilla Lame, MD;  Location: [place];  Service: Endoscopy;  Laterality: N/A;  SIGMOID COLON POLYPS X  5  . SHOULDER ARTHROSCOPY W/ ROTATOR CUFF REPAIR Right 1998  . TEE WITHOUT CARDIOVERSION N/A 01/06/2018   Procedure: TRANSESOPHAGEAL ECHOCARDIOGRAM (TEE);  Surgeon: Minna Merritts, MD;  Location: [facility];  Service: Cardiovascular;  Laterality: N/A;  . TONSILLECTOMY AND ADENOIDECTOMY      There were no vitals filed for this visit.  Subjective Assessment - 09/16/18 1702    Subjective   Sharon [name] presemnts for OT Rx visit 5 / 36 to address BLE LE. Pt is accompanied by her spouse, Sharon [name].  Pt reports thigh length wraps were falling down by last night so she removed them and dis not re-wrap.    Patient is accompained by:  Family member    Pertinent History  Hx anxiety, COPD, OA, Asthma, hx cervical ca, osteoporosis, hx pneumonia, hx CVA, hx LLE cellulitis, hx L leg wound, hx falls    Limitations  chronic BLE leg swelling and associated pain, decreased balance, fall risk, decreased standing, walking and sitting tolerance, difficulty w/ lower body bathing and dressing, difficulty fitting LB clothing and shoes, unable to  drive, impaired transfers and functional mobility, impaired social participation, impaired ability to perform household chores, cooking, meal prep    Repetition  Increases Symptoms    Special Tests  Dopler negative for DVT    Patient Stated Goals  reduce leg pain and swelling so I can do more                   OT Treatments/Exercises (OP) - 09/16/18 0001      ADLs   ADL Education Given  Yes      Manual Therapy   Manual Therapy  Edema management;Compression Bandaging;Manual Lymphatic Drainage (MLD)    Edema Management  skin care to LLE with low ph  [name] ( skin grade castor oil) throughuot MLD to increase skin hydration and mobilit     Manual Lymphatic Drainage (MLD)  MLD to LLE using functional L inguinal LN. Omitted lateral neck portion of "short neck sequence" due to hx of stroke. Modified neck sequence included clavicular strokes only. Pt activated deep abdominal lymphatics ising diaphragmatic breathing after skiilled instruction. Tulized inguinal LN and worked in proximal to distal sequence  from thigh to toes. Completed MLD with 3 full sequemces  from distal to proximal pathways in rhwetrograde fashion. Good tolerance.    Compression Bandaging  Extended compression wraps above knee today using 2 additiona 12 CM wraps in effort to mobilize medial thigh swelling towards inguinal LN.              OT Education - 09/16/18 1704    Education Details  Continued skilled Pt/caregiver education  And LE ADL training throughout visit for lymphedema self care/ home program, including compression wrapping, compression garment and device wear/care, lymphatic pumping ther ex, simple self-MLD, and skin care. Discussed progress towards goals.     Person(s) Educated  Patient;Spouse    Methods  Explanation;Demonstration;Handout;Tactile cues;Verbal cues    Comprehension  Verbalized understanding;Returned demonstration;Need further instruction;Verbal cues required;Tactile cues required          OT Long Term Goals - 09/02/18 1635      OT LONG TERM GOAL #1   Title  Pt will demonstrate understanding of lymphedema (LE) precautions / prevention principals, including signs / symptoms of cellulitis infection with modified independence using LE Workbook as printed reference to identify 6 precautions without verbal cues by end of 3rd  OT Rx visit.   (Pended)     Baseline  Max A  (Pended)     Time  3  (Pended)     Period  Days  (Pended)     Status  New  (Pended)     Target Date  --  (Pended)    3rd OT treatment visit     OT LONG TERM GOAL #2    Title  Pt will be able to apply knee-length, multi-layer, short stretch compression wraps using correct gradient techniques with moderate  caregiver assistance to achieve optimal limb volume reduction during Intensive Phase CDT, and to return affected limb(s) as closely as possible, to premorbid size and shape.  (Pended)     Baseline  Max A  (Pended)     Time  2  (Pended)     Period  Weeks  (Pended)     Status  New  (Pended)     Target Date  --  (Pended)    4-5th OT Rx visit     OT LONG TERM GOAL #3   Title  Pt to achieve at least  10%  limb volume reduction in affected limb(s)  bilaterally during Intensive Phase CDT to control limb swelling, to improve tissue integrity and immune function, to improve ADLs performance and to improve functional mobility/ transfer, and to improve body image and self-esteem.  (Pended)     Baseline  dependent  (Pended)     Time  12  (Pended)     Period  Weeks  (Pended)     Status  New  (Pended)     Target Date  11/29/18  (Pended)       OT LONG TERM GOAL #4   Title  Pt will achieve 100% compliance with daily LE self-care home program components, including daily  skin care, simple self-MLD, gradient compression wraps/ compression garments/devices, and therapeutic exercise with Mod CG support to ensure optimal Intensive Phase limb volume reduction to expedite compression garment/ device fitting.  (Pended)     Baseline  dependent  (Pended)     Time  12  (Pended)     Period  Weeks  (Pended)     Status  New  (Pended)     Target Date  11/29/18  (Pended)       OT LONG TERM GOAL #5   Title  Pt will use appropriate  assistive devices during LE self-care training to don compression garments/devices with modified independence to ensure optimal LE self-management over time and to limit progression of chronic LE.  (Pended)     Baseline  Max A  (Pended)     Time  12  (Pended)     Period  Weeks  (Pended)     Status  New  (Pended)       Long Term Additional Goals    Additional Long Term Goals  Yes  (Pended)       OT LONG TERM GOAL #6   Title  Pt will retain optimal limb volume reductions achieved during Intensive Phase CDT with no more than 3% volume increase with ongoing CG assistance to limit LE progression and further functional decline.  (Pended)     Baseline  Max A  (Pended)     Time  6  (Pended)     Period  Months  (Pended)     Status  New  (Pended)     Target Date  03/01/19  (Pended)             Plan - 09/16/18 1706    Clinical Impression Statement  Provided MLD, skin care and thigh length compression today . Pt tolerated well and withut difficulty. Limb volume below the knee appears mildly decreased by visual assessment since last visit. Cont as per POC.    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   Rehab Potential  Good    OT Frequency  3x / week    OT Duration  12 weeks    OT Treatment/Interventions  Self-care/ADL training;Therapeutic exercise;DME and/or AE instruction;Therapist, nutritional;Compression bandaging;Balance training;Other (comment);Manual Therapy;Coping strategies training;Patient/family education;Therapeutic activities;Manual lymph drainage;Energy conservation   skin care with low ph lotion   Clinical Decision Making  Several treatment options, min-mod task modification necessary    OT Home Exercise Plan  lymphatic pumping ther ex    Recommended Other Services  fit w/ ccl 2 knee length compression stockings and convoluted HOS devices to decrease fibrosis    Consulted and Agree with Plan of Care  Patient;Family member/caregiver  Family Member Consulted  spouse       Patient will benefit from skilled therapeutic intervention in order to improve the following deficits and impairments:  Abnormal gait, Decreased balance, Decreased mobility, Decreased skin  integrity, Difficulty walking, Decreased range of motion, Increased edema, Pain, Decreased activity tolerance, Decreased knowledge of use of DME, Impaired flexibility  Visit Diagnosis: Lymphedema, not elsewhere classified    Problem List Patient Active Problem List   Diagnosis Date Noted  . Senile purpura ([place]) 07/28/2018  . Chronic venous insufficiency 07/14/2018  . Lymphedema 07/14/2018  . Aneurysm of descending thoracic aorta (HCC) 05/04/2018  . Allergic rhinitis 02/16/2018  . Aortic atherosclerosis ([place]) 01/13/2018  . Status post placement of implantable loop recorder 01/12/2018  . Left-sided weakness 01/12/2018  . Chronic kidney disease, stage 3 ([place]) 01/12/2018  . Cerebrovascular accident (CVA) due to embolism of precerebral artery ([place])   . TIA (transient ischemic attack) 01/04/2018  . Osteoarthritis of wrist 12/09/2017  . Piriformis syndrome, left 11/25/2017  . CAD (coronary artery disease) 11/25/2017  . Caregiver stress 11/25/2017  . Menopause 08/14/2017  . Abnormal weight gain 08/14/2017  . De Quervain's tenosynovitis, right 07/17/2017  . Stress incontinence 06/19/2017  . Personal history of tobacco use, presenting hazards to health 04/01/2017  . Anxiety 03/13/2017  . Glossitis 11/14/2016  . Advanced care planning/counseling discussion 10/17/2016  . Other fecal abnormalities   . Benign neoplasm of sigmoid colon   . COPD (chronic obstructive pulmonary disease) ([place]) 10/17/2015  . Mass of upper inner quadrant of right breast 06/22/2015    Andrey Spearman, MS, OTR/L, [facility] 09/16/18 5:07 PM  [place] MAIN [facility] SERVICES [address] [place], [name], [address] Phone: [phone]   Fax:  [phone]  Name: SHEILA OCASIO MRN: [id number] Date of Birth: 07-Sep-1946

## 2018-09-16 NOTE — Therapy (Signed)
[place] MAIN [facility] SERVICES [address] [place], [name], [address] Phone: [phone]   Fax:  [phone]  Occupational Therapy Treatment  Patient Details  Name: Sharon [name] MRN: [id number] Date of Birth: Jun 02, 1946 No data recorded  Encounter Date: 09/16/2018  OT End of Session - 09/16/18 1705    Visit Number  5    Number of Visits  36    Date for OT Re-Evaluation  11/29/18    OT Start Time  0215    OT Stop Time  0315    OT Time Calculation (min)  60 min    Activity Tolerance  Patient tolerated treatment well;No increased pain    Behavior During Therapy  WFL for tasks assessed/performed       Past Medical History:  Diagnosis Date  . Anxiety   . Arthritis    hands, upper back  . Asthma   . COPD (chronic obstructive pulmonary disease) ([place])   . History of cervical cancer   . Menopausal disorder   . Osteoporosis   . Pneumonia 1960  . Spasm of abdominal muscles of right side    intermittent  . TMJ (dislocation of temporomandibular joint)   . Wears dentures    partial lower    Past Surgical History:  Procedure Laterality Date  . ABDOMINAL HYSTERECTOMY  1970's  . bladder botox  2005  . BLADDER SUSPENSION  2004  . CATARACT EXTRACTION W/PHACO Right 03/09/2018   Procedure: CATARACT EXTRACTION PHACO AND INTRAOCULAR LENS PLACEMENT ([place]) right;  Surgeon: Eulogio Bear, MD;  Location: [place];  Service: Ophthalmology;  Laterality: Right;  CALL CELL 1ST  . CATARACT EXTRACTION W/PHACO Left 04/19/2018   Procedure: CATARACT EXTRACTION PHACO AND INTRAOCULAR LENS PLACEMENT (IOC)  LEFT;  Surgeon: Eulogio Bear, MD;  Location: [place];  Service: Ophthalmology;  Laterality: Left;  . COLONOSCOPY WITH PROPOFOL N/A 12/13/2015   Procedure: COLONOSCOPY WITH PROPOFOL;  Surgeon: Lucilla Lame, MD;  Location: [place];  Service: Endoscopy;  Laterality: N/A;  . LOOP RECORDER INSERTION N/A 01/07/2018    Procedure: LOOP RECORDER INSERTION;  Surgeon: Deboraha Sprang, MD;  Location: [place] [facility];  Service: Cardiovascular;  Laterality: N/A;  . POLYPECTOMY N/A 12/13/2015   Procedure: POLYPECTOMY;  Surgeon: Lucilla Lame, MD;  Location: [place];  Service: Endoscopy;  Laterality: N/A;  SIGMOID COLON POLYPS X  5  . SHOULDER ARTHROSCOPY W/ ROTATOR CUFF REPAIR Right 1998  . TEE WITHOUT CARDIOVERSION N/A 01/06/2018   Procedure: TRANSESOPHAGEAL ECHOCARDIOGRAM (TEE);  Surgeon: Minna Merritts, MD;  Location: [facility];  Service: Cardiovascular;  Laterality: N/A;  . TONSILLECTOMY AND ADENOIDECTOMY      There were no vitals filed for this visit.  Subjective Assessment - 09/16/18 1702    Subjective   Sharon [name] presemnts for OT Rx visit 5 / 36 to address BLE LE. Pt is accompanied by her spouse, Sharon [name].  Pt reports thigh length wraps were falling down by last night so she removed them and dis not re-wrap.    Patient is accompained by:  Family member    Pertinent History  Hx anxiety, COPD, OA, Asthma, hx cervical ca, osteoporosis, hx pneumonia, hx CVA, hx LLE cellulitis, hx L leg wound, hx falls    Limitations  chronic BLE leg swelling and associated pain, decreased balance, fall risk, decreased standing, walking and sitting tolerance, difficulty w/ lower body bathing and dressing, difficulty fitting LB clothing and shoes, unable to  drive, impaired transfers and functional mobility, impaired social participation, impaired ability to perform household chores, cooking, meal prep    Repetition  Increases Symptoms    Special Tests  Dopler negative for DVT    Patient Stated Goals  reduce leg pain and swelling so I can do more                   OT Treatments/Exercises (OP) - 09/16/18 0001      ADLs   ADL Education Given  Yes      Manual Therapy   Manual Therapy  Edema management;Compression Bandaging;Manual Lymphatic Drainage (MLD)    Edema Management  skin care to LLE with low ph  Sharon [name] ( skin grade castor oil) throughuot MLD to increase skin hydration and mobilit     Manual Lymphatic Drainage (MLD)  MLD to LLE using functional L inguinal LN. Omitted lateral neck portion of "short neck sequence" due to hx of stroke. Modified neck sequence included clavicular strokes only. Pt activated deep abdominal lymphatics ising diaphragmatic breathing after skiilled instruction. Tulized inguinal LN and worked in proximal to distal sequence  from thigh to toes. Completed MLD with 3 full sequemces  from distal to proximal pathways in rhwetrograde fashion. Good tolerance.    Compression Bandaging  Extended compression wraps above knee today using 2 additiona 12 CM wraps in effort to mobilize medial thigh swelling towards inguinal LN.              OT Education - 09/16/18 1704    Education Details  Continued skilled Pt/caregiver education  And LE ADL training throughout visit for lymphedema self care/ home program, including compression wrapping, compression garment and device wear/care, lymphatic pumping ther ex, simple self-MLD, and skin care. Discussed progress towards goals.     Person(s) Educated  Patient;Spouse    Methods  Explanation;Demonstration;Handout;Tactile cues;Verbal cues    Comprehension  Verbalized understanding;Returned demonstration;Need further instruction;Verbal cues required;Tactile cues required          OT Long Term Goals - 09/02/18 1635      OT LONG TERM GOAL #1   Title  Pt will demonstrate understanding of lymphedema (LE) precautions / prevention principals, including signs / symptoms of cellulitis infection with modified independence using LE Workbook as printed reference to identify 6 precautions without verbal cues by end of 3rd  OT Rx visit.   (Pended)     Baseline  Max A  (Pended)     Time  3  (Pended)     Period  Days  (Pended)     Status  New  (Pended)     Target Date  --  (Pended)    3rd OT treatment visit     OT LONG TERM GOAL #2    Title  Pt will be able to apply knee-length, multi-layer, short stretch compression wraps using correct gradient techniques with moderate  caregiver assistance to achieve optimal limb volume reduction during Intensive Phase CDT, and to return affected limb(s) as closely as possible, to premorbid size and shape.  (Pended)     Baseline  Max A  (Pended)     Time  2  (Pended)     Period  Weeks  (Pended)     Status  New  (Pended)     Target Date  --  (Pended)    4-5th OT Rx visit     OT LONG TERM GOAL #3   Title  Pt to achieve at least  10%  limb volume reduction in affected limb(s)  bilaterally during Intensive Phase CDT to control limb swelling, to improve tissue integrity and immune function, to improve ADLs performance and to improve functional mobility/ transfer, and to improve body image and self-esteem.  (Pended)     Baseline  dependent  (Pended)     Time  12  (Pended)     Period  Weeks  (Pended)     Status  New  (Pended)     Target Date  11/29/18  (Pended)       OT LONG TERM GOAL #4   Title  Pt will achieve 100% compliance with daily LE self-care home program components, including daily  skin care, simple self-MLD, gradient compression wraps/ compression garments/devices, and therapeutic exercise with Mod CG support to ensure optimal Intensive Phase limb volume reduction to expedite compression garment/ device fitting.  (Pended)     Baseline  dependent  (Pended)     Time  12  (Pended)     Period  Weeks  (Pended)     Status  New  (Pended)     Target Date  11/29/18  (Pended)       OT LONG TERM GOAL #5   Title  Pt will use appropriate  assistive devices during LE self-care training to don compression garments/devices with modified independence to ensure optimal LE self-management over time and to limit progression of chronic LE.  (Pended)     Baseline  Max A  (Pended)     Time  12  (Pended)     Period  Weeks  (Pended)     Status  New  (Pended)       Long Term Additional Goals    Additional Long Term Goals  Yes  (Pended)       OT LONG TERM GOAL #6   Title  Pt will retain optimal limb volume reductions achieved during Intensive Phase CDT with no more than 3% volume increase with ongoing CG assistance to limit LE progression and further functional decline.  (Pended)     Baseline  Max A  (Pended)     Time  6  (Pended)     Period  Months  (Pended)     Status  New  (Pended)     Target Date  03/01/19  (Pended)             Plan - 09/16/18 1706    Clinical Impression Statement  Initial Lymphedema Life Impact Scale (LLIS) score of 50% reveals extent to which Pt feels problems associated with LE affected her life in the past week..  Provided MLD, skin care and thigh length compression today . Pt tolerated well and withut difficulty. Limb volume below the knee appears mildly decreased by visual assessment since last visit. Cont as per POC.    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   Rehab Potential  Good    OT Frequency  3x / week    OT Duration  12 weeks    OT Treatment/Interventions  Self-care/ADL training;Therapeutic exercise;DME and/or AE instruction;Therapist, nutritional;Compression bandaging;Balance training;Other (comment);Manual Therapy;Coping strategies training;Patient/family education;Therapeutic activities;Manual lymph drainage;Energy conservation   skin care with low ph lotion   Clinical Decision Making  Several treatment options, min-mod task modification necessary    OT Home Exercise Plan  lymphatic pumping ther ex    Recommended Other Services  fit w/ ccl  2 knee length compression stockings and convoluted HOS devices to decrease fibrosis    Consulted and Agree with Plan of Care  Patient;Family member/caregiver    Family Member Consulted  spouse       Patient will benefit from skilled  therapeutic intervention in order to improve the following deficits and impairments:  Abnormal gait, Decreased balance, Decreased mobility, Decreased skin integrity, Difficulty walking, Decreased range of motion, Increased edema, Pain, Decreased activity tolerance, Decreased knowledge of use of DME, Impaired flexibility  Visit Diagnosis: Lymphedema, not elsewhere classified    Problem List Patient Active Problem List   Diagnosis Date Noted  . Senile purpura ([place]) 07/28/2018  . Chronic venous insufficiency 07/14/2018  . Lymphedema 07/14/2018  . Aneurysm of descending thoracic aorta (HCC) 05/04/2018  . Allergic rhinitis 02/16/2018  . Aortic atherosclerosis ([place]) 01/13/2018  . Status post placement of implantable loop recorder 01/12/2018  . Left-sided weakness 01/12/2018  . Chronic kidney disease, stage 3 ([place]) 01/12/2018  . Cerebrovascular accident (CVA) due to embolism of precerebral artery ([place])   . TIA (transient ischemic attack) 01/04/2018  . Osteoarthritis of wrist 12/09/2017  . Piriformis syndrome, left 11/25/2017  . CAD (coronary artery disease) 11/25/2017  . Caregiver stress 11/25/2017  . Menopause 08/14/2017  . Abnormal weight gain 08/14/2017  . De Quervain's tenosynovitis, right 07/17/2017  . Stress incontinence 06/19/2017  . Personal history of tobacco use, presenting hazards to health 04/01/2017  . Anxiety 03/13/2017  . Glossitis 11/14/2016  . Advanced care planning/counseling discussion 10/17/2016  . Other fecal abnormalities   . Benign neoplasm of sigmoid colon   . COPD (chronic obstructive pulmonary disease) ([place]) 10/17/2015  . Mass of upper inner quadrant of right breast 06/22/2015    Ansel Bong 09/16/2018, 5:24 PM  [place] MAIN [facility] SERVICES [address] [place], [name], [address] Phone: [phone]   Fax:  [phone]  Name: Sharon [name] MRN: [id number] Date of Birth: [birth year]

## 2018-09-21 ENCOUNTER — Ambulatory Visit: Payer: Medicare Other | Admitting: Occupational Therapy

## 2018-09-21 ENCOUNTER — Ambulatory Visit (INDEPENDENT_AMBULATORY_CARE_PROVIDER_SITE_OTHER): Payer: Medicare Other

## 2018-09-21 ENCOUNTER — Other Ambulatory Visit: Payer: Self-pay | Admitting: Nurse Practitioner

## 2018-09-21 ENCOUNTER — Telehealth: Payer: Self-pay | Admitting: Nurse Practitioner

## 2018-09-21 DIAGNOSIS — K140 Glossitis: Secondary | ICD-10-CM

## 2018-09-21 DIAGNOSIS — I890 Lymphedema, not elsewhere classified: Secondary | ICD-10-CM

## 2018-09-21 MED ORDER — CLOPIDOGREL BISULFATE 75 MG PO TABS: 75.0000 mg | ORAL_TABLET | Freq: Every day | ORAL | 3 refills | Status: DC

## 2018-09-21 NOTE — Progress Notes (Signed)
Refill for Plavix sent to pharmacy per patient request.

## 2018-09-21 NOTE — Telephone Encounter (Signed)
Patient is requesting her plavix be sent to [facility]ar. She will be out on the 19th.  Thanks

## 2018-09-21 NOTE — Telephone Encounter (Signed)
This was sent.

## 2018-09-23 ENCOUNTER — Ambulatory Visit: Payer: Medicare Other | Admitting: Occupational Therapy

## 2018-09-23 DIAGNOSIS — I890 Lymphedema, not elsewhere classified: Secondary | ICD-10-CM | POA: Diagnosis not present

## 2018-09-23 NOTE — Therapy (Signed)
[place] MAIN [facility] SERVICES [address] [place], [name], [address] Phone: [phone]   Fax:  [phone]  Occupational Therapy Treatment  Patient Details  Name: Sharon [name] MRN: [id number] Date of Birth: January 22, 1946 No data recorded  Encounter Date: 09/23/2018  OT End of Session - 09/23/18 1751    Visit Number  6    Number of Visits  36    Date for OT Re-Evaluation  11/29/18    OT Start Time  0200    OT Stop Time  0306    OT Time Calculation (min)  66 min    Activity Tolerance  Patient tolerated treatment well;No increased pain    Behavior During Therapy  WFL for tasks assessed/performed       Past Medical History:  Diagnosis Date  . Anxiety   . Arthritis    hands, upper back  . Asthma   . COPD (chronic obstructive pulmonary disease) ([place])   . History of cervical cancer   . Menopausal disorder   . Osteoporosis   . Pneumonia 1960  . Spasm of abdominal muscles of right side    intermittent  . TMJ (dislocation of temporomandibular joint)   . Wears dentures    partial lower    Past Surgical History:  Procedure Laterality Date  . ABDOMINAL HYSTERECTOMY  1970's  . bladder botox  2005  . BLADDER SUSPENSION  2004  . CATARACT EXTRACTION W/PHACO Right 03/09/2018   Procedure: CATARACT EXTRACTION PHACO AND INTRAOCULAR LENS PLACEMENT ([place]) right;  Surgeon: Eulogio Bear, MD;  Location: [place];  Service: Ophthalmology;  Laterality: Right;  CALL CELL 1ST  . CATARACT EXTRACTION W/PHACO Left 04/19/2018   Procedure: CATARACT EXTRACTION PHACO AND INTRAOCULAR LENS PLACEMENT (IOC)  LEFT;  Surgeon: Eulogio Bear, MD;  Location: [place];  Service: Ophthalmology;  Laterality: Left;  . COLONOSCOPY WITH PROPOFOL N/A 12/13/2015   Procedure: COLONOSCOPY WITH PROPOFOL;  Surgeon: Lucilla Lame, MD;  Location: [place];  Service: Endoscopy;  Laterality: N/A;  . LOOP RECORDER INSERTION N/A 01/07/2018   Procedure: LOOP RECORDER INSERTION;  Surgeon: Deboraha Sprang, MD;  Location: [place] [facility];  Service: Cardiovascular;  Laterality: N/A;  . POLYPECTOMY N/A 12/13/2015   Procedure: POLYPECTOMY;  Surgeon: Lucilla Lame, MD;  Location: [place];  Service: Endoscopy;  Laterality: N/A;  SIGMOID COLON POLYPS X  5  . SHOULDER ARTHROSCOPY W/ ROTATOR CUFF REPAIR Right 1998  . TEE WITHOUT CARDIOVERSION N/A 01/06/2018   Procedure: TRANSESOPHAGEAL ECHOCARDIOGRAM (TEE);  Surgeon: Minna Merritts, MD;  Location: [facility];  Service: Cardiovascular;  Laterality: N/A;  . TONSILLECTOMY AND ADENOIDECTOMY      There were no vitals filed for this visit.  Subjective Assessment - 09/23/18 1747    Subjective   Mrs Florendo presemnts for OT Rx visit 56/ 36 to address BLE LE. Pt is accompanied by her spouse, [name].  Pt presents with knee length compression wraps in place on LLE. She reports she wrapped her LLE herself bc spouse is "not doing it right."    Patient is accompained by:  Family member    Pertinent History  Hx anxiety, COPD, OA, Asthma, hx cervical ca, osteoporosis, hx pneumonia, hx CVA, hx LLE cellulitis, hx L leg wound, hx falls    Limitations  chronic BLE leg swelling and associated pain, decreased balance, fall risk, decreased standing, walking and sitting tolerance, difficulty w/ lower body bathing and dressing, difficulty fitting LB clothing  and shoes, unable to drive, impaired transfers and functional mobility, impaired social participation, impaired ability to perform household chores, cooking, meal prep    Repetition  Increases Symptoms    Special Tests  Dopler negative for DVT    Patient Stated Goals  reduce leg pain and swelling so I can do more    Currently in Pain?  --   not rated numericqally   Pain Location  Leg    Pain Orientation  Right;Left    Pain Descriptors / Indicators  Tender;Sore;Pressure;Numbness;Discomfort;Heaviness;Tightness;Tiring    Pain Type  Chronic pain    Pain  Frequency  Intermittent          LYMPHEDEMA/ONCOLOGY QUESTIONNAIRE - 09/23/18 1751      Left Lower Extremity Lymphedema   Other  LLE limb volume from ankle to tibial tuberosity (A-D) measures 2956.62 ml. LLE limb volume from knee to groin (E-G) measures 5273.84 ml. LLE full leg (A-G) limb volume measures 8680.54m.     Other  Limb volume measurements reveal limb volume reduction for leg, thigh section and full leg. LLE ankle to tibial tuberosity  limb volume reduction measures 13.24% since  commencing OT for CDT on 08/31/2018. LLE E-G  ( knee to groin) reduction measures 14.02%, and full leg limb volume reduction measures 9.03% today.              OT Treatments/Exercises (OP) - 09/23/18 0001      ADLs   ADL Education Given  Yes      Manual Therapy   Manual Therapy  Edema management;Compression Bandaging;Manual Lymphatic Drainage (MLD)    Manual therapy comments  completed LLE comprative limb volumetriics    Edema Management  skin care to LLE with low ph PRaoul Pitch( skin grade castor oil) throughuot MLD to increase skin hydration and mobilit     Manual Lymphatic Drainage (MLD)  MLD to LLE using functional L inguinal LN. Omitted lateral neck portion of "short neck sequence" due to hx of stroke. Modified neck sequence included clavicular strokes only. Pt activated deep abdominal lymphatics ising diaphragmatic breathing after skiilled instruction. Tulized inguinal LN and worked in proximal to distal sequence  from thigh to toes. Completed MLD with 3 full sequemces  from distal to proximal pathways in rhwetrograde fashion. Good tolerance.    Compression Bandaging  LLE gradient compression wraps from foot to groin as established.             OT Education - 09/23/18 1750    Education Details  Contined skilled Pt and family edu for gradient compression wraps application for last 15 minutesof session. Spouse made cell phone video for reference at home during visit intervals.     Person(s) Educated  Patient;Spouse    Methods  Explanation;Demonstration;Handout;Tactile cues;Verbal cues    Comprehension  Verbalized understanding;Returned demonstration;Need further instruction;Verbal cues required;Tactile cues required          OT Long Term Goals - 09/23/18 1759      OT LONG TERM GOAL #1   Title  Pt will demonstrate understanding of lymphedema (LE) precautions / prevention principals, including signs / symptoms of cellulitis infection with modified independence using LE Workbook as printed reference to identify 6 precautions without verbal cues by end of 3rd  OT Rx visit.     Baseline  Max A    Time  3    Period  Days    Status  Achieved      OT LONG TERM GOAL #2  Title  Pt will be able to apply knee-length, multi-layer, short stretch compression wraps using correct gradient techniques with moderate  caregiver assistance to achieve optimal limb volume reduction during Intensive Phase CDT, and to return affected limb(s) as closely as possible, to premorbid size and shape.    Baseline  Max A    Time  2    Period  Weeks    Status  Achieved      OT LONG TERM GOAL #3   Title  Pt to achieve at least  10% limb volume reduction in affected limb(s)  bilaterally during Intensive Phase CDT to control limb swelling, to improve tissue integrity and immune function, to improve ADLs performance and to improve functional mobility/ transfer, and to improve body image and self-esteem.    Baseline  dependent 09/23/2018 : Met for leg and thigh with 13.24% and 14.02% reductions. Full leg volume goal nearly achieved with 9.03% volume reduction.    Time  12    Period  Weeks    Status  Partially Met      OT LONG TERM GOAL #4   Title  Pt will achieve 100% compliance with daily LE self-care home program components, including daily  skin care, simple self-MLD, gradient compression wraps/ compression garments/devices, and therapeutic exercise with Mod CG support to ensure optimal Intensive  Phase limb volume reduction to expedite compression garment/ device fitting.    Baseline  dependent    Time  12    Period  Weeks    Status  Partially Met      OT LONG TERM GOAL #5   Title  Pt will use appropriate  assistive devices during LE self-care training to don compression garments/devices with modified independence to ensure optimal LE self-management over time and to limit progression of chronic LE.    Baseline  Max A    Time  12    Period  Weeks    Status  New      OT LONG TERM GOAL #6   Title  Pt will retain optimal limb volume reductions achieved during Intensive Phase CDT with no more than 3% volume increase with ongoing CG assistance to limit LE progression and further functional decline.    Baseline  Max A    Time  6    Period  Months    Status  New            Plan - 09/23/18 1756    Clinical Impression Statement  Completed LLE comparative limb volumetrics today with excellent progress observed for both limb segmnents and fulll  LLE. Limb volume measurements reveal limb volume reduction for leg, thigh section and full leg. LLE ankle to tibial tuberosity  limb volume reduction measures 13.24% since  commencing OT for CDT on 08/31/2018. LLE E-G  ( knee to groin) reduction measures 14.02%, and full leg limb volume reduction measures 9.03% today. Skin appears less reddened today with less waxiness     in texture. Pitting is resolved. Pt c/o increased soreness below the knee. Pt tolerated MLD, skin care and compression therapy to LLE without difficulty. Spouse and Pt edu contimnued for gradient wrapping techniques. Spouse made cell phone ideo for reference. Cont as per POC.    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   Rehab Potential  Good    OT Frequency  3x / week    OT  Duration  12 weeks    OT  Treatment/Interventions  Self-care/ADL training;Therapeutic exercise;DME and/or AE instruction;Therapist, nutritional;Compression bandaging;Balance training;Other (comment);Manual Therapy;Coping strategies training;Patient/family education;Therapeutic activities;Manual lymph drainage;Energy conservation   skin care with low ph lotion   Clinical Decision Making  Several treatment options, min-mod task modification necessary    OT Home Exercise Plan  lymphatic pumping ther ex    Recommended Other Services  fit w/ ccl 2 knee length compression stockings and convoluted HOS devices to decrease fibrosis    Consulted and Agree with Plan of Care  Patient;Family member/caregiver    Family Member Consulted  spouse       Patient will benefit from skilled therapeutic intervention in order to improve the following deficits and impairments:  Abnormal gait, Decreased balance, Decreased mobility, Decreased skin integrity, Difficulty walking, Decreased range of motion, Increased edema, Pain, Decreased activity tolerance, Decreased knowledge of use of DME, Impaired flexibility  Visit Diagnosis: Lymphedema, not elsewhere classified    Problem List Patient Active Problem List   Diagnosis Date Noted  . Senile purpura ([place]) 07/28/2018  . Chronic venous insufficiency 07/14/2018  . Lymphedema 07/14/2018  . Aneurysm of descending thoracic aorta (HCC) 05/04/2018  . Allergic rhinitis 02/16/2018  . Aortic atherosclerosis ([place]) 01/13/2018  . Status post placement of implantable loop recorder 01/12/2018  . Left-sided weakness 01/12/2018  . Chronic kidney disease, stage 3 ([place]) 01/12/2018  . Cerebrovascular accident (CVA) due to embolism of precerebral artery ([place])   . TIA (transient ischemic attack) 01/04/2018  . Osteoarthritis of wrist 12/09/2017  . Piriformis syndrome, left 11/25/2017  . CAD (coronary artery disease) 11/25/2017  . Caregiver stress 11/25/2017  . Menopause 08/14/2017  . Abnormal weight  gain 08/14/2017  . De Quervain's tenosynovitis, right 07/17/2017  . Stress incontinence 06/19/2017  . Personal history of tobacco use, presenting hazards to health 04/01/2017  . Anxiety 03/13/2017  . Glossitis 11/14/2016  . Advanced care planning/counseling discussion 10/17/2016  . Other fecal abnormalities   . Benign neoplasm of sigmoid colon   . COPD (chronic obstructive pulmonary disease) ([place]) 10/17/2015  . Mass of upper inner quadrant of right breast 06/22/2015    Andrey Spearman, MS, OTR/L, [facility] 09/23/18 6:04 PM  [place] MAIN [facility] SERVICES [address] [place], [name], [address] Phone: [phone]   Fax:  [phone]  Name: Sharon [name] MRN: [id number] Date of Birth: [birth year]

## 2018-09-27 ENCOUNTER — Ambulatory Visit (INDEPENDENT_AMBULATORY_CARE_PROVIDER_SITE_OTHER): Payer: Medicare Other

## 2018-09-27 DIAGNOSIS — I639 Cerebral infarction, unspecified: Secondary | ICD-10-CM | POA: Diagnosis not present

## 2018-09-27 NOTE — Progress Notes (Signed)
Carelink Summary Report / Loop Recorder 

## 2018-09-28 ENCOUNTER — Ambulatory Visit: Payer: Medicare Other | Admitting: Occupational Therapy

## 2018-09-28 DIAGNOSIS — I890 Lymphedema, not elsewhere classified: Secondary | ICD-10-CM | POA: Diagnosis not present

## 2018-09-28 NOTE — Therapy (Signed)
[place] MAIN [facility] SERVICES [address] [place], [name], [address] Phone: [phone]   Fax:  [phone]  Occupational Therapy Treatment  Patient Details  Name: Sharon [name] MRN: [id number] Date of Birth: [birth year] No data recorded  Encounter Date: 09/21/2018    Past Medical History:  Diagnosis Date  . Anxiety   . Arthritis    hands, upper back  . Asthma   . COPD (chronic obstructive pulmonary disease) ([place])   . History of cervical cancer   . Menopausal disorder   . Osteoporosis   . Pneumonia 1960  . Spasm of abdominal muscles of right side    intermittent  . TMJ (dislocation of temporomandibular joint)   . Wears dentures    partial lower    Past Surgical History:  Procedure Laterality Date  . ABDOMINAL HYSTERECTOMY  1970's  . bladder botox  2005  . BLADDER SUSPENSION  2004  . CATARACT EXTRACTION W/PHACO Right 03/09/2018   Procedure: CATARACT EXTRACTION PHACO AND INTRAOCULAR LENS PLACEMENT ([place]) right;  Surgeon: Eulogio Bear, MD;  Location: [place];  Service: Ophthalmology;  Laterality: Right;  CALL CELL 1ST  . CATARACT EXTRACTION W/PHACO Left 04/19/2018   Procedure: CATARACT EXTRACTION PHACO AND INTRAOCULAR LENS PLACEMENT (I[facility])  LEFT;  Surgeon: Eulogio Bear, MD;  Location: [place];  Service: Ophthalmology;  Laterality: Left;  . COLONOSCOPY WITH PROPOFOL N/A 12/13/2015   Procedure: COLONOSCOPY WITH PROPOFOL;  Surgeon: Lucilla Lame, MD;  Location: [place];  Service: Endoscopy;  Laterality: N/A;  . LOOP RECORDER INSERTION N/A 01/07/2018   Procedure: LOOP RECORDER INSERTION;  Surgeon: Deboraha Sprang, MD;  Location: [place] [facility];  Service: Cardiovascular;  Laterality: N/A;  . POLYPECTOMY N/A 12/13/2015   Procedure: POLYPECTOMY;  Surgeon: Lucilla Lame, MD;  Location: [place];  Service: Endoscopy;  Laterality: N/A;  SIGMOID COLON POLYPS X  5  . SHOULDER ARTHROSCOPY W/  ROTATOR CUFF REPAIR Right 1998  . TEE WITHOUT CARDIOVERSION N/A 01/06/2018   Procedure: TRANSESOPHAGEAL ECHOCARDIOGRAM (TEE);  Surgeon: Minna Merritts, MD;  Location: [facility];  Service: Cardiovascular;  Laterality: N/A;  . TONSILLECTOMY AND ADENOIDECTOMY      There were no vitals filed for this visit.                             OT Long Term Goals - 09/23/18 1759      OT LONG TERM GOAL #1   Title  Pt will demonstrate understanding of lymphedema (LE) precautions / prevention principals, including signs / symptoms of cellulitis infection with modified independence using LE Workbook as printed reference to identify 6 precautions without verbal cues by end of 3rd  OT Rx visit.     Baseline  Max A    Time  3    Period  Days    Status  Achieved      OT LONG TERM GOAL #2   Title  Pt will be able to apply knee-length, multi-layer, short stretch compression wraps using correct gradient techniques with moderate  caregiver assistance to achieve optimal limb volume reduction during Intensive Phase CDT, and to return affected limb(s) as closely as possible, to premorbid size and shape.    Baseline  Max A    Time  2    Period  Weeks    Status  Achieved      OT LONG TERM GOAL #3  Title  Pt to achieve at least  10% limb volume reduction in affected limb(s)  bilaterally during Intensive Phase CDT to control limb swelling, to improve tissue integrity and immune function, to improve ADLs performance and to improve functional mobility/ transfer, and to improve body image and self-esteem.    Baseline  dependent 09/23/2018 : Met for leg and thigh with 13.24% and 14.02% reductions. Full leg volume goal nearly achieved with 9.03% volume reduction.    Time  12    Period  Weeks    Status  Partially Met      OT LONG TERM GOAL #4   Title  Pt will achieve 100% compliance with daily LE self-care home program components, including daily  skin care, simple self-MLD, gradient  compression wraps/ compression garments/devices, and therapeutic exercise with Mod CG support to ensure optimal Intensive Phase limb volume reduction to expedite compression garment/ device fitting.    Baseline  dependent    Time  12    Period  Weeks    Status  Partially Met      OT LONG TERM GOAL #5   Title  Pt will use appropriate  assistive devices during LE self-care training to don compression garments/devices with modified independence to ensure optimal LE self-management over time and to limit progression of chronic LE.    Baseline  Max A    Time  12    Period  Weeks    Status  New      OT LONG TERM GOAL #6   Title  Pt will retain optimal limb volume reductions achieved during Intensive Phase CDT with no more than 3% volume increase with ongoing CG assistance to limit LE progression and further functional decline.    Baseline  Max A    Time  6    Period  Months    Status  New              Patient will benefit from skilled therapeutic intervention in order to improve the following deficits and impairments:     Visit Diagnosis: No diagnosis found.    Problem List Patient Active Problem List   Diagnosis Date Noted  . Senile purpura ([place]) 07/28/2018  . Chronic venous insufficiency 07/14/2018  . Lymphedema 07/14/2018  . Aneurysm of descending thoracic aorta (HCC) 05/04/2018  . Allergic rhinitis 02/16/2018  . Aortic atherosclerosis ([place]) 01/13/2018  . Status post placement of implantable loop recorder 01/12/2018  . Left-sided weakness 01/12/2018  . Chronic kidney disease, stage 3 ([place]) 01/12/2018  . Cerebrovascular accident (CVA) due to embolism of precerebral artery ([place])   . TIA (transient ischemic attack) 01/04/2018  . Osteoarthritis of wrist 12/09/2017  . Piriformis syndrome, left 11/25/2017  . CAD (coronary artery disease) 11/25/2017  . Caregiver stress 11/25/2017  . Menopause 08/14/2017  . Abnormal weight gain 08/14/2017  . De Quervain's tenosynovitis,  right 07/17/2017  . Stress incontinence 06/19/2017  . Personal history of tobacco use, presenting hazards to health 04/01/2017  . Anxiety 03/13/2017  . Glossitis 11/14/2016  . Advanced care planning/counseling discussion 10/17/2016  . Other fecal abnormalities   . Benign neoplasm of sigmoid colon   . COPD (chronic obstructive pulmonary disease) ([place]) 10/17/2015  . Mass of upper inner quadrant of right breast 06/22/2015    Andrey Spearman, MS, OTR/L, [facility] 09/28/18 4:34 PM   [place] MAIN [facility] SERVICES [address] [place], [name], [address] Phone: [phone]   Fax:  [phone]  Name:  MALASHIA KAMAKA MRN: [id number] Date of Birth: 1946-04-03

## 2018-09-29 ENCOUNTER — Other Ambulatory Visit: Payer: Self-pay | Admitting: Nurse Practitioner

## 2018-09-29 MED ORDER — CLOPIDOGREL BISULFATE 75 MG PO TABS: 75.0000 mg | ORAL_TABLET | Freq: Every day | ORAL | 6 refills | Status: DC

## 2018-09-29 MED ORDER — ALBUTEROL SULFATE HFA 108 (90 BASE) MCG/ACT IN AERS: 2.0000 | INHALATION_SPRAY | Freq: Four times a day (QID) | RESPIRATORY_TRACT | 12 refills | Status: DC | PRN

## 2018-09-29 MED ORDER — PANTOPRAZOLE SODIUM 40 MG PO TBEC: 40.0000 mg | DELAYED_RELEASE_TABLET | Freq: Every day | ORAL | 3 refills | Status: DC

## 2018-09-29 MED ORDER — ATORVASTATIN CALCIUM 40 MG PO TABS: ORAL_TABLET | ORAL | 6 refills | Status: DC

## 2018-09-29 NOTE — Telephone Encounter (Signed)
Copied from [place] [id number]. Topic: Quick Communication - Rx Refill/Question >> Sep 29, 2018  8:47 AM Celedonio Savage L wrote: Medication: pantoprazole (PROTONIX) 40 MG tablet   SYMBICORT 160-4.5 MCG/ACT inhaler atorvastatin   (LIPITOR) 40 MG tablet  clopidogrel (PLAVIX) 75 MG tablet albuterol (PROAIR HFA) 108 (90 Base) MCG/ACT inhaler   Has the patient contacted their pharmacy? Yes.    new pharmacy the other pharmacy had been brought out  (Agent: If no, request that the patient contact the pharmacy for the refill.) (Agent: If yes, when and what did the pharmacy advise?) the pharmacy don't have anything on pt she is new to  them  Preferred Pharmacy (with phone number or street name): [facility] [place], [address] AT [place] [phone] (Phone) [phone] (Fax)    Agent: Please be advised that RX refills may take up to 3 business days. We ask that you follow-up with your pharmacy.

## 2018-09-29 NOTE — Telephone Encounter (Signed)
Patient is requesting that her prescriptions be transferred to new pharmacy. Patient is former patient of [facility] and Ned Card is now her PCP. 2 Rx are still listed under [facility] and 1 is Orene Desanctis- the other is [name].  It would be more efficient for the if the Rx where all under her PCP for future refills. Sent for provider review and adjustments to Rx refills if needed.

## 2018-09-29 NOTE — Therapy (Signed)
[place] MAIN [facility] SERVICES [address] [place], [name], [address] Phone: [phone]   Fax:  [phone]  Occupational Therapy Treatment  Patient Details  Name: Sharon [name] MRN: [id number] Date of Birth: [birth year] No data recorded  Encounter Date: 09/28/2018  OT End of Session - 09/28/18 1325    Visit Number  7    Number of Visits  36    Date for OT Re-Evaluation  11/29/18    OT Start Time  0215    OT Stop Time  0324    OT Time Calculation (min)  69 min    Activity Tolerance  Patient tolerated treatment well;No increased pain    Behavior During Therapy  WFL for tasks assessed/performed       Past Medical History:  Diagnosis Date  . Anxiety   . Arthritis    hands, upper back  . Asthma   . COPD (chronic obstructive pulmonary disease) ([place])   . History of cervical cancer   . Menopausal disorder   . Osteoporosis   . Pneumonia 1960  . Spasm of abdominal muscles of right side    intermittent  . TMJ (dislocation of temporomandibular joint)   . Wears dentures    partial lower    Past Surgical History:  Procedure Laterality Date  . ABDOMINAL HYSTERECTOMY  1970's  . bladder botox  2005  . BLADDER SUSPENSION  2004  . CATARACT EXTRACTION W/PHACO Right 03/09/2018   Procedure: CATARACT EXTRACTION PHACO AND INTRAOCULAR LENS PLACEMENT ([place]) right;  Surgeon: Sharon Bear, MD;  Location: [place];  Service: Ophthalmology;  Laterality: Right;  CALL CELL 1ST  . CATARACT EXTRACTION W/PHACO Left 04/19/2018   Procedure: CATARACT EXTRACTION PHACO AND INTRAOCULAR LENS PLACEMENT (IOC)  LEFT;  Surgeon: Sharon Bear, MD;  Location: [place];  Service: Ophthalmology;  Laterality: Left;  . COLONOSCOPY WITH PROPOFOL N/A 12/13/2015   Procedure: COLONOSCOPY WITH PROPOFOL;  Surgeon: Sharon Lame, MD;  Location: [place];  Service: Endoscopy;  Laterality: N/A;  . LOOP RECORDER INSERTION N/A 01/07/2018    Procedure: LOOP RECORDER INSERTION;  Surgeon: Sharon Sprang, MD;  Location: [place] [facility];  Service: Cardiovascular;  Laterality: N/A;  . POLYPECTOMY N/A 12/13/2015   Procedure: POLYPECTOMY;  Surgeon: Sharon Lame, MD;  Location: [place];  Service: Endoscopy;  Laterality: N/A;  SIGMOID COLON POLYPS X  5  . SHOULDER ARTHROSCOPY W/ ROTATOR CUFF REPAIR Right 1998  . TEE WITHOUT CARDIOVERSION N/A 01/06/2018   Procedure: TRANSESOPHAGEAL ECHOCARDIOGRAM (TEE);  Surgeon: Sharon Merritts, MD;  Location: [facility];  Service: Cardiovascular;  Laterality: N/A;  . TONSILLECTOMY AND ADENOIDECTOMY      There were no vitals filed for this visit.  Subjective Assessment - 09/28/18 1323    Subjective   Sharon [name] presemnts for OT Rx visit 7/ 36 to address BLE LE. Pt is  unaccompanied during Rx session. Pt c/o compression wraps falling down from top edge soon after we apply them.     Patient is accompained by:  Family member    Pertinent History  Hx anxiety, COPD, OA, Asthma, hx cervical ca, osteoporosis, hx pneumonia, hx CVA, hx LLE cellulitis, hx L leg wound, hx falls    Limitations  chronic BLE leg swelling and associated pain, decreased balance, fall risk, decreased standing, walking and sitting tolerance, difficulty w/ lower body bathing and dressing, difficulty fitting LB clothing and shoes, unable to drive, impaired transfers and functional mobility,  impaired social participation, impaired ability to perform household chores, cooking, meal prep    Repetition  Increases Symptoms    Special Tests  Dopler negative for DVT    Patient Stated Goals  reduce leg pain and swelling so I can do more                   OT Treatments/Exercises (OP) - 09/29/18 0001      ADLs   ADL Education Given  Yes      Manual Therapy   Manual Therapy  Edema management;Compression Bandaging;Manual Lymphatic Drainage (MLD)    Edema Management  skin care to LLE with low ph [name] ( skin grade  castor oil) throughuot MLD to increase skin hydration and mobilit     Manual Lymphatic Drainage (MLD)  MLD to LLE using functional L inguinal LN. Omitted lateral neck portion of "short neck sequence" due to hx of stroke. Modified neck sequence included clavicular strokes only. Pt activated deep abdominal lymphatics ising diaphragmatic breathing after skiilled instruction. Tulized inguinal LN and worked in proximal to distal sequence  from thigh to toes. Completed MLD with 3 full sequemces  from distal to proximal pathways in rhwetrograde fashion. Good tolerance.    Compression Bandaging  LLE gradient compression wraps from foot to groin as established.             OT Education - 09/28/18 1324    Education Details  Contined skilled Pt and family edu for gradient compression wraps application for last 15 minutesof session. Spouse made cell phone video for reference at home during visit intervals.    Person(s) Educated  Patient;Spouse    Methods  Explanation;Demonstration;Handout;Tactile cues;Verbal cues    Comprehension  Verbalized understanding;Returned demonstration;Need further instruction;Verbal cues required;Tactile cues required          OT Long Term Goals - 09/23/18 1759      OT LONG TERM GOAL #1   Title  Pt will demonstrate understanding of lymphedema (LE) precautions / prevention principals, including signs / symptoms of cellulitis infection with modified independence using LE Workbook as printed reference to identify 6 precautions without verbal cues by end of 3rd  OT Rx visit.     Baseline  Max A    Time  3    Period  Days    Status  Achieved      OT LONG TERM GOAL #2   Title  Pt will be able to apply knee-length, multi-layer, short stretch compression wraps using correct gradient techniques with moderate  caregiver assistance to achieve optimal limb volume reduction during Intensive Phase CDT, and to return affected limb(s) as closely as possible, to premorbid size and  shape.    Baseline  Max A    Time  2    Period  Weeks    Status  Achieved      OT LONG TERM GOAL #3   Title  Pt to achieve at least  10% limb volume reduction in affected limb(s)  bilaterally during Intensive Phase CDT to control limb swelling, to improve tissue integrity and immune function, to improve ADLs performance and to improve functional mobility/ transfer, and to improve body image and self-esteem.    Baseline  dependent 09/23/2018 : Met for leg and thigh with 13.24% and 14.02% reductions. Full leg volume goal nearly achieved with 9.03% volume reduction.    Time  12    Period  Weeks    Status  Partially Met  OT LONG TERM GOAL #4   Title  Pt will achieve 100% compliance with daily LE self-care home program components, including daily  skin care, simple self-MLD, gradient compression wraps/ compression garments/devices, and therapeutic exercise with Mod CG support to ensure optimal Intensive Phase limb volume reduction to expedite compression garment/ device fitting.    Baseline  dependent    Time  12    Period  Weeks    Status  Partially Met      OT LONG TERM GOAL #5   Title  Pt will use appropriate  assistive devices during LE self-care training to don compression garments/devices with modified independence to ensure optimal LE self-management over time and to limit progression of chronic LE.    Baseline  Max A    Time  12    Period  Weeks    Status  New      OT LONG TERM GOAL #6   Title  Pt will retain optimal limb volume reductions achieved during Intensive Phase CDT with no more than 3% volume increase with ongoing CG assistance to limit LE progression and further functional decline.    Baseline  Max A    Time  6    Period  Months    Status  New            Plan - 09/28/18 1325    Clinical Impression Statement  Pt performing more of her compression wrapping between visit intervals as she states that spouse doesnt apply them as instructed. Limb voolume  continues to decrease from ankle to groin. Foot swelling remains stubborn and persistent. Pt denies reduced leg pain. Pt tolerated LLE MLD, skin care and thigh length compression today without difficulty. If wraps continue to fall down from top edge bunching at knee, we will resume knee length wrapping. Cont as per POC.    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   Rehab Potential  Good    OT Frequency  3x / week    OT Duration  12 weeks    OT Treatment/Interventions  Self-care/ADL training;Therapeutic exercise;DME and/or AE instruction;Therapist, nutritional;Compression bandaging;Balance training;Other (comment);Manual Therapy;Coping strategies training;Patient/family education;Therapeutic activities;Manual lymph drainage;Energy conservation   skin care with low ph lotion   Clinical Decision Making  Several treatment options, min-mod task modification necessary    OT Home Exercise Plan  lymphatic pumping ther ex    Recommended Other Services  fit w/ ccl 2 knee length compression stockings and convoluted HOS devices to decrease fibrosis    Consulted and Agree with Plan of Care  Patient;Family member/caregiver    Family Member Consulted  spouse       Patient will benefit from skilled therapeutic intervention in order to improve the following deficits and impairments:  Abnormal gait, Decreased balance, Decreased mobility, Decreased skin integrity, Difficulty walking, Decreased range of motion, Increased edema, Pain, Decreased activity tolerance, Decreased knowledge of use of DME, Impaired flexibility  Visit Diagnosis: Lymphedema, not elsewhere classified    Problem List Patient Active Problem List   Diagnosis Date Noted  . Senile purpura ([place]) 07/28/2018  . Chronic venous insufficiency 07/14/2018  . Lymphedema 07/14/2018  .  Aneurysm of descending thoracic aorta (HCC) 05/04/2018  . Allergic rhinitis 02/16/2018  . Aortic atherosclerosis ([place]) 01/13/2018  . Status post placement of implantable loop recorder 01/12/2018  . Left-sided weakness 01/12/2018  . Chronic kidney disease, stage  3 ([place]) 01/12/2018  . Cerebrovascular accident (CVA) due to embolism of precerebral artery ([place])   . TIA (transient ischemic attack) 01/04/2018  . Osteoarthritis of wrist 12/09/2017  . Piriformis syndrome, left 11/25/2017  . CAD (coronary artery disease) 11/25/2017  . Caregiver stress 11/25/2017  . Menopause 08/14/2017  . Abnormal weight gain 08/14/2017  . De Quervain's tenosynovitis, right 07/17/2017  . Stress incontinence 06/19/2017  . Personal history of tobacco use, presenting hazards to health 04/01/2017  . Anxiety 03/13/2017  . Glossitis 11/14/2016  . Advanced care planning/counseling discussion 10/17/2016  . Other fecal abnormalities   . Benign neoplasm of sigmoid colon   . COPD (chronic obstructive pulmonary disease) ([place]) 10/17/2015  . Mass of upper inner quadrant of right breast 06/22/2015    Andrey Spearman, MS, OTR/L, [facility] 09/29/18 1:30 PM   [place] MAIN [facility] SERVICES [address] [place], [name], [address] Phone: [phone]   Fax:  [phone]  Name: Sharon [name] MRN: [id number] Date of Birth: [birth year]

## 2018-09-29 NOTE — Telephone Encounter (Signed)
Refill requests approved. 

## 2018-09-30 ENCOUNTER — Ambulatory Visit: Payer: Medicare Other | Admitting: Occupational Therapy

## 2018-09-30 DIAGNOSIS — I890 Lymphedema, not elsewhere classified: Secondary | ICD-10-CM | POA: Diagnosis not present

## 2018-09-30 NOTE — Therapy (Signed)
[place] MAIN [facility] SERVICES [address] [place], [name], [address] Phone: [phone]   Fax:  [phone]  Occupational Therapy Treatment  Patient Details  Name: Sharon [name] MRN: [id number] Date of Birth: [birth year] No data recorded  Encounter Date: 09/30/2018  OT End of Session - 09/30/18 1517    Visit Number  8    Number of Visits  36    Date for OT Re-Evaluation  11/29/18    OT Start Time  0210    OT Stop Time  0315    OT Time Calculation (min)  65 min    Activity Tolerance  Patient tolerated treatment well;No increased pain    Behavior During Therapy  WFL for tasks assessed/performed       Past Medical History:  Diagnosis Date  . Anxiety   . Arthritis    hands, upper back  . Asthma   . COPD (chronic obstructive pulmonary disease) ([place])   . History of cervical cancer   . Menopausal disorder   . Osteoporosis   . Pneumonia 1960  . Spasm of abdominal muscles of right side    intermittent  . TMJ (dislocation of temporomandibular joint)   . Wears dentures    partial lower    Past Surgical History:  Procedure Laterality Date  . ABDOMINAL HYSTERECTOMY  1970's  . bladder botox  2005  . BLADDER SUSPENSION  2004  . CATARACT EXTRACTION W/PHACO Right 03/09/2018   Procedure: CATARACT EXTRACTION PHACO AND INTRAOCULAR LENS PLACEMENT ([place]) right;  Surgeon: Eulogio Bear, MD;  Location: [place];  Service: Ophthalmology;  Laterality: Right;  CALL CELL 1ST  . CATARACT EXTRACTION W/PHACO Left 04/19/2018   Procedure: CATARACT EXTRACTION PHACO AND INTRAOCULAR LENS PLACEMENT (IOC)  LEFT;  Surgeon: Eulogio Bear, MD;  Location: [place];  Service: Ophthalmology;  Laterality: Left;  . COLONOSCOPY WITH PROPOFOL N/A 12/13/2015   Procedure: COLONOSCOPY WITH PROPOFOL;  Surgeon: Lucilla Lame, MD;  Location: [place];  Service: Endoscopy;  Laterality: N/A;  . LOOP RECORDER INSERTION N/A 01/07/2018    Procedure: LOOP RECORDER INSERTION;  Surgeon: Deboraha Sprang, MD;  Location: [place] [facility];  Service: Cardiovascular;  Laterality: N/A;  . POLYPECTOMY N/A 12/13/2015   Procedure: POLYPECTOMY;  Surgeon: Lucilla Lame, MD;  Location: [place];  Service: Endoscopy;  Laterality: N/A;  SIGMOID COLON POLYPS X  5  . SHOULDER ARTHROSCOPY W/ ROTATOR CUFF REPAIR Right 1998  . TEE WITHOUT CARDIOVERSION N/A 01/06/2018   Procedure: TRANSESOPHAGEAL ECHOCARDIOGRAM (TEE);  Surgeon: Minna Merritts, MD;  Location: [facility];  Service: Cardiovascular;  Laterality: N/A;  . TONSILLECTOMY AND ADENOIDECTOMY      There were no vitals filed for this visit.  Subjective Assessment - 09/30/18 1506    Subjective   Mrs Cheetham presemnts for OT Rx visit 8/ 36 to address BLE LE. Pt is  unaccompanied during Rx session. Pt states her L leg feels better today and she demonstrates improved knee and hip flexion .    Patient is accompained by:  Family member    Pertinent History  Hx anxiety, COPD, OA, Asthma, hx cervical ca, osteoporosis, hx pneumonia, hx CVA, hx LLE cellulitis, hx L leg wound, hx falls    Limitations  chronic BLE leg swelling and associated pain, decreased balance, fall risk, decreased standing, walking and sitting tolerance, difficulty w/ lower body bathing and dressing, difficulty fitting LB clothing and shoes, unable to drive, impaired transfers and  functional mobility, impaired social participation, impaired ability to perform household chores, cooking, meal prep    Repetition  Increases Symptoms    Special Tests  Dopler negative for DVT    Patient Stated Goals  reduce leg pain and swelling so I can do more    Currently in Pain?  Yes   decreased LE pain and swelling. RLE unchanged. not rated numerically today.                  OT Treatments/Exercises (OP) - 09/30/18 0001      ADLs   ADL Education Given  Yes      Manual Therapy   Manual Therapy  Edema management;Compression  Bandaging;Manual Lymphatic Drainage (MLD)    Edema Management  skin care to RLE with low ph [name] ( skin grade castor oil) throughuot MLD to increase skin hydration and mobilit     Manual Lymphatic Drainage (MLD)  MLD to RLE using functional L inguinal LN. Omitted lateral neck portion of "short neck sequence" due to hx of stroke. Modified neck sequence included clavicular strokes only. Pt activated deep abdominal lymphatics ising diaphragmatic breathing after skiilled instruction. Tulized inguinal LN and worked in proximal to distal sequence  from thigh to toes. Completed MLD with 3 full sequemces  from distal to proximal pathways in rhwetrograde fashion. Good tolerance.    Compression Bandaging  No wraps to RLE for holidays. Fitted Pt w/ sample Juzo thigh length  cmopression stockings for trial.  20-30 mmHg.LLE gradient compression wraps from foot to groin as established.             OT Education - 09/30/18 1516    Education Details  Pt edu for donning and doffing compression garment using assistive devices ( friction gloves and Tyvek sock).    Person(s) Educated  Patient;Spouse    Methods  Explanation;Demonstration;Handout;Tactile cues;Verbal cues    Comprehension  Verbalized understanding;Returned demonstration;Need further instruction;Verbal cues required;Tactile cues required          OT Long Term Goals - 09/23/18 1759      OT LONG TERM GOAL #1   Title  Pt will demonstrate understanding of lymphedema (LE) precautions / prevention principals, including signs / symptoms of cellulitis infection with modified independence using LE Workbook as printed reference to identify 6 precautions without verbal cues by end of 3rd  OT Rx visit.     Baseline  Max A    Time  3    Period  Days    Status  Achieved      OT LONG TERM GOAL #2   Title  Pt will be able to apply knee-length, multi-layer, short stretch compression wraps using correct gradient techniques with moderate  caregiver  assistance to achieve optimal limb volume reduction during Intensive Phase CDT, and to return affected limb(s) as closely as possible, to premorbid size and shape.    Baseline  Max A    Time  2    Period  Weeks    Status  Achieved      OT LONG TERM GOAL #3   Title  Pt to achieve at least  10% limb volume reduction in affected limb(s)  bilaterally during Intensive Phase CDT to control limb swelling, to improve tissue integrity and immune function, to improve ADLs performance and to improve functional mobility/ transfer, and to improve body image and self-esteem.    Baseline  dependent 09/23/2018 : Met for leg and thigh with 13.24% and 14.02% reductions. Full  leg volume goal nearly achieved with 9.03% volume reduction.    Time  12    Period  Weeks    Status  Partially Met      OT LONG TERM GOAL #4   Title  Pt will achieve 100% compliance with daily LE self-care home program components, including daily  skin care, simple self-MLD, gradient compression wraps/ compression garments/devices, and therapeutic exercise with Mod CG support to ensure optimal Intensive Phase limb volume reduction to expedite compression garment/ device fitting.    Baseline  dependent    Time  12    Period  Weeks    Status  Partially Met      OT LONG TERM GOAL #5   Title  Pt will use appropriate  assistive devices during LE self-care training to don compression garments/devices with modified independence to ensure optimal LE self-management over time and to limit progression of chronic LE.    Baseline  Max A    Time  12    Period  Weeks    Status  New      OT LONG TERM GOAL #6   Title  Pt will retain optimal limb volume reductions achieved during Intensive Phase CDT with no more than 3% volume increase with ongoing CG assistance to limit LE progression and further functional decline.    Baseline  Max A    Time  6    Period  Months    Status  New            Plan - 09/30/18 1517    Clinical Impression  Statement  Completed anatomicaL MEASUREMENTS AND determined Pt will fit into size 2 Juzo Regular thigh length compression garment. Pt educated for donning and doffing garment using assistive devices to reduce physical effort. By end of session Pty able to use tyvek sock and friction gloves to dont garmnents with modified independence. Pt able to teach back wear   and care plan for sample garment including resuming use of ss wraps should swelling not remain well contained with loaned garment. Commenced MLD and skin care to RLE today. Pt will keep emphasis of  self care for LE management primarily on LLE over the holoidays. She is instructed to perform daily skin care and simple self MLD bilaterally. Cont as per POC.    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   Rehab Potential  Good    OT Frequency  3x / week    OT Duration  12 weeks    OT Treatment/Interventions  Self-care/ADL training;Therapeutic exercise;DME and/or AE instruction;Therapist, nutritional;Compression bandaging;Balance training;Other (comment);Manual Therapy;Coping strategies training;Patient/family education;Therapeutic activities;Manual lymph drainage;Energy conservation   skin care with low ph lotion   Clinical Decision Making  Several treatment options, min-mod task modification necessary    OT Home Exercise Plan  lymphatic pumping ther ex    Recommended Other Services  fit w/ ccl 2 knee length compression stockings and convoluted HOS devices to decrease fibrosis    Consulted and Agree with Plan of Care  Patient;Family member/caregiver    Family Member Consulted  spouse       Patient will benefit from skilled therapeutic intervention in order to improve the following deficits and impairments:  Abnormal gait, Decreased balance, Decreased mobility, Decreased skin  integrity, Difficulty walking, Decreased range of motion, Increased edema, Pain, Decreased activity tolerance, Decreased knowledge of use of DME, Impaired  flexibility  Visit Diagnosis: Lymphedema, not elsewhere classified    Problem List Patient Active Problem List   Diagnosis Date Noted  . Senile purpura ([place]) 07/28/2018  . Chronic venous insufficiency 07/14/2018  . Lymphedema 07/14/2018  . Aneurysm of descending thoracic aorta (HCC) 05/04/2018  . Allergic rhinitis 02/16/2018  . Aortic atherosclerosis ([place]) 01/13/2018  . Status post placement of implantable loop recorder 01/12/2018  . Left-sided weakness 01/12/2018  . Chronic kidney disease, stage 3 ([place]) 01/12/2018  . Cerebrovascular accident (CVA) due to embolism of precerebral artery ([place])   . TIA (transient ischemic attack) 01/04/2018  . Osteoarthritis of wrist 12/09/2017  . Piriformis syndrome, left 11/25/2017  . CAD (coronary artery disease) 11/25/2017  . Caregiver stress 11/25/2017  . Menopause 08/14/2017  . Abnormal weight gain 08/14/2017  . De Quervain's tenosynovitis, right 07/17/2017  . Stress incontinence 06/19/2017  . Personal history of tobacco use, presenting hazards to health 04/01/2017  . Anxiety 03/13/2017  . Glossitis 11/14/2016  . Advanced care planning/counseling discussion 10/17/2016  . Other fecal abnormalities   . Benign neoplasm of sigmoid colon   . COPD (chronic obstructive pulmonary disease) ([place]) 10/17/2015  . Mass of upper inner quadrant of right breast 06/22/2015    Andrey Spearman, MS, OTR/L, [facility] 09/30/18 3:23 PM   [place] MAIN [facility] SERVICES [address] [place], [name], [address] Phone: [phone]   Fax:  [phone]  Name: ETHA STAMBAUGH MRN: [id number] Date of Birth: Oct 07, 1946

## 2018-10-07 ENCOUNTER — Ambulatory Visit (INDEPENDENT_AMBULATORY_CARE_PROVIDER_SITE_OTHER): Payer: Medicare Other

## 2018-10-07 DIAGNOSIS — R0602 Shortness of breath: Secondary | ICD-10-CM | POA: Diagnosis not present

## 2018-10-09 LAB — CUP PACEART REMOTE DEVICE CHECK
Implantable Pulse Generator Implant Date: 20190328
MDC IDC SESS DTM: 20191111163942

## 2018-10-12 ENCOUNTER — Telehealth: Payer: Self-pay | Admitting: *Deleted

## 2018-10-12 NOTE — Telephone Encounter (Signed)
Left a message for the patient to call back.  

## 2018-10-12 NOTE — Telephone Encounter (Signed)
-----   Message from Rise Mu, PA-C sent at 10/07/2018  4:35 PM EST ----- Echo showed normal pump function with normal wall motion. There was mild thickening/stiffening of the heart. Pulmonary artery pressure was upper limits of normal, possibly in the setting of prior smoking. A small pericardial effusion was noted posterior to the heart. Optimal BP control is recommended for the thickening and stiffened heart. BP was well controlled at her last appointment with [place].

## 2018-10-15 NOTE — Telephone Encounter (Signed)
Left a message for the patient to call back.  

## 2018-10-18 NOTE — Telephone Encounter (Signed)
Results of echo given verbally to patient. She expressed understanding. I confirmed her upcoming appt in March.  Advised pt to call for any further questions or concerns No new orders at this time.

## 2018-10-18 NOTE — Telephone Encounter (Signed)
Pt is returning the call regarding her echo results.

## 2018-10-19 ENCOUNTER — Ambulatory Visit (INDEPENDENT_AMBULATORY_CARE_PROVIDER_SITE_OTHER): Payer: Medicare Other

## 2018-10-19 ENCOUNTER — Ambulatory Visit (INDEPENDENT_AMBULATORY_CARE_PROVIDER_SITE_OTHER): Payer: Medicare Other | Admitting: Vascular Surgery

## 2018-10-19 ENCOUNTER — Encounter (INDEPENDENT_AMBULATORY_CARE_PROVIDER_SITE_OTHER): Payer: Self-pay | Admitting: Vascular Surgery

## 2018-10-19 VITALS — BP 122/70 | HR 92 | Resp 16 | Ht 69.0 in | Wt 216.6 lb

## 2018-10-19 DIAGNOSIS — K140 Glossitis: Secondary | ICD-10-CM

## 2018-10-19 DIAGNOSIS — Z87891 Personal history of nicotine dependence: Secondary | ICD-10-CM | POA: Diagnosis not present

## 2018-10-19 DIAGNOSIS — I712 Thoracic aortic aneurysm, without rupture: Secondary | ICD-10-CM

## 2018-10-19 DIAGNOSIS — N183 Chronic kidney disease, stage 3 unspecified: Secondary | ICD-10-CM

## 2018-10-19 DIAGNOSIS — I7123 Aneurysm of the descending thoracic aorta, without rupture: Secondary | ICD-10-CM

## 2018-10-19 DIAGNOSIS — I890 Lymphedema, not elsewhere classified: Secondary | ICD-10-CM | POA: Diagnosis not present

## 2018-10-19 DIAGNOSIS — R5383 Other fatigue: Secondary | ICD-10-CM

## 2018-10-19 DIAGNOSIS — I6310 Cerebral infarction due to embolism of unspecified precerebral artery: Secondary | ICD-10-CM | POA: Diagnosis not present

## 2018-10-19 NOTE — Progress Notes (Signed)
MRN : [id number]  Sharon [name] is a 73 y.o. (22-Nov-1945) female who presents with chief complaint of  Chief Complaint  Patient presents with  . Follow-up  .  History of Present Illness: Patient returns today in follow up of lymphedema and leg swelling.  She has been getting treatment from the lymphedema clinic with good results.  Her left leg swelling and pain are significantly better although not resolved.  They are going to begin treatment on the right leg shortly.  Compression has helped.  No new ulceration or infection.  Current Outpatient Medications  Medication Sig Dispense Refill  . albuterol (PROAIR HFA) 108 (90 Base) MCG/ACT inhaler Inhale 2 puffs into the lungs every 6 (six) hours as needed for wheezing or shortness of breath. 8.5 Inhaler 12  . aspirin EC 81 MG tablet Take 81 mg by mouth every other day.     Marland Kitchen atorvastatin (LIPITOR) 40 MG tablet TAKE 1 TABLET BY MOUTH ONCE A DAY AT 6 PM 30 tablet 6  . b complex vitamins capsule Take 1 capsule by mouth daily.    . citalopram (CELEXA) 20 MG tablet Take 1 tablet (20 mg total) by mouth daily. (Patient taking differently: Take 20 mg by mouth daily. PRN) 30 tablet 3  . clopidogrel (PLAVIX) 75 MG tablet Take 1 tablet (75 mg total) by mouth daily. 30 tablet 6  . Cyanocobalamin 1000 MCG/ML KIT Inject 1,000 mcg as directed every 30 (thirty) days.    . Emollient (EUCERIN) lotion Apply topically as needed for dry skin.    Marland Kitchen estradiol (VIVELLE-DOT) 0.1 MG/24HR patch APPLY 1 PATCH ONTO THE SKIN 2 TIMES A WEEK 8 patch 11  . loratadine (CLARITIN) 10 MG tablet Take 10 mg by mouth daily.    . Multiple Vitamin (MULTIVITAMIN) tablet Take 2 tablets by mouth daily.     . pantoprazole (PROTONIX) 40 MG tablet Take 1 tablet (40 mg total) by mouth daily. 90 tablet 3  . SYMBICORT 160-4.5 MCG/ACT inhaler USE 2 PUFFS TWICE DAILY 10.2 Inhaler 11  . Tiotropium Bromide Monohydrate (SPIRIVA RESPIMAT) 2.5 MCG/ACT AERS Inhale 2 puffs into the lungs daily.  (Patient not taking: Reported on 10/19/2018) 1 Inhaler 12   Current Facility-Administered Medications  Medication Dose Route Frequency Provider Last Rate Last Dose  . cyanocobalamin ((VITAMIN B-12)) injection 1,000 mcg  1,000 mcg Intramuscular Q30 days Kathrine Haddock, NP   1,000 mcg at 09/21/18 1307    Past Medical History:  Diagnosis Date  . Anxiety   . Arthritis    hands, upper back  . Asthma   . COPD (chronic obstructive pulmonary disease) ([place])   . History of cervical cancer   . Menopausal disorder   . Osteoporosis   . Pneumonia 1960  . Spasm of abdominal muscles of right side    intermittent  . TMJ (dislocation of temporomandibular joint)   . Wears dentures    partial lower    Past Surgical History:  Procedure Laterality Date  . ABDOMINAL HYSTERECTOMY  1970's  . bladder botox  2005  . BLADDER SUSPENSION  2004  . CATARACT EXTRACTION W/PHACO Right 03/09/2018   Procedure: CATARACT EXTRACTION PHACO AND INTRAOCULAR LENS PLACEMENT ([place]) right;  Surgeon: Eulogio Bear, MD;  Location: [facility];  Service: Ophthalmology;  Laterality: Right;  CALL CELL 1ST  . CATARACT EXTRACTION W/PHACO Left 04/19/2018   Procedure: CATARACT EXTRACTION PHACO AND INTRAOCULAR LENS PLACEMENT (IOC)  LEFT;  Surgeon: Eulogio Bear, MD;  Location: [facility];  Service: Ophthalmology;  Laterality: Left;  . COLONOSCOPY WITH PROPOFOL N/A 12/13/2015   Procedure: COLONOSCOPY WITH PROPOFOL;  Surgeon: Lucilla Lame, MD;  Location: [facility];  Service: Endoscopy;  Laterality: N/A;  . LOOP RECORDER INSERTION N/A 01/07/2018   Procedure: LOOP RECORDER INSERTION;  Surgeon: Deboraha Sprang, MD;  Location: [place] [facility];  Service: Cardiovascular;  Laterality: N/A;  . POLYPECTOMY N/A 12/13/2015   Procedure: POLYPECTOMY;  Surgeon: Lucilla Lame, MD;  Location: [facility];  Service: Endoscopy;  Laterality: N/A;  SIGMOID COLON POLYPS X  5  . SHOULDER ARTHROSCOPY W/ ROTATOR CUFF  REPAIR Right 1998  . TEE WITHOUT CARDIOVERSION N/A 01/06/2018   Procedure: TRANSESOPHAGEAL ECHOCARDIOGRAM (TEE);  Surgeon: Minna Merritts, MD;  Location: [facility];  Service: Cardiovascular;  Laterality: N/A;  . TONSILLECTOMY AND ADENOIDECTOMY      Social History Social History   Tobacco Use  . Smoking status: Former Smoker    Packs/day: 0.60    Years: 56.00    Pack years: 33.60    Types: Cigarettes    Last attempt to quit: 07/14/2016    Years since quitting: 2.2  . Smokeless tobacco: Never Used  . Tobacco comment: has smoked off and on  Substance Use Topics  . Alcohol use: Yes    Alcohol/week: 0.0 standard drinks    Comment: occasionally - special occasions  . Drug use: No     Family History Family History  Problem Relation Age of Onset  . Diabetes Mother   . Heart disease Mother   . Stroke Mother   . Stroke Maternal Grandmother     Allergies  Allergen Reactions  . Baclofen Swelling  . Librium [Chlordiazepoxide] Itching    Dizziness   . Bactrim [Sulfamethoxazole-Trimethoprim] Nausea And Vomiting  . Ibuprofen Rash    Mouth swelling  . Latex Rash    Some bandaids, some gloves; BLOOD TEST NEGATIVE  . Naprosyn [Naproxen] Rash    Mouth swelling  . Other Rash    [place] nuts - mouth swelling     REVIEW OF SYSTEMS (Negative unless checked)  Constitutional: _0 ?Weight loss  _1 ?Fever  _2 ?Chills Cardiac: _3 ?Chest pain   _4 ?Chest pressure   _5 ?Palpitations   _6 ?Shortness of breath when laying flat   _7 ?Shortness of breath at rest   _8 ?Shortness of breath with exertion. Vascular:  _9 ?Pain in legs with walking   _10 ?Pain in legs at rest   _11 ?Pain in legs when laying flat   _12 ?Claudication   _13 ?Pain in feet when walking  _14 ?Pain in feet at rest  _15 ?Pain in feet when laying flat   _16 ?History of DVT   _17 ?Phlebitis   _18 ?Swelling in legs   _19 ?Varicose veins   _20 ?Non-healing ulcers Pulmonary:   _21 ?Uses home oxygen   _22 ?Productive cough   _23 ?Hemoptysis   _24 ?Wheeze   _25 ?COPD   _26 ?Asthma Neurologic:  _27 ?Dizziness  _28 ?Blackouts   _29 ?Seizures   _30 ?History of stroke   _31 ?History of TIA  _32 ?Aphasia   _33 ?Temporary blindness   _34 ?Dysphagia   _35 ?Weakness or numbness in arms   _36 ?Weakness or numbness in legs Musculoskeletal:  _37 ?Arthritis   _38 ?Joint swelling   _39 ?Joint pain   _40 ?Low back pain Hematologic:  _41 ?Easy bruising  _42 ?Easy bleeding   _43 ?Hypercoagulable state   _44 ?Anemic  _45 ?Hepatitis Gastrointestinal:  _46 ?Blood in stool   _47 ?Vomiting blood  _48 ?Gastroesophageal reflux/heartburn   _49 ?Abdominal pain Genitourinary:  _50 ?Chronic kidney disease   _51 ?Difficult urination  _52 ?Frequent urination  _53 ?Burning with urination   _54 ?Hematuria Skin:  _55 ?  Rashes   _0 ?Ulcers   _1 ?Wounds Psychological:  _2 ?History of anxiety   _3 ? History of major depression.   Physical Examination  BP 122/70 (BP Location: Right Arm, Patient Position: Sitting)   Pulse 92   Resp 16   Ht _4  (1.753 m)   Wt 216 lb 9.6 oz (98.2 kg)   LMP  (LMP Unknown)   BMI 31.99 kg/m  Gen:  WD/WN, NAD Head: [place]/AT, No temporalis wasting. Ear/Nose/Throat: Hearing grossly intact, nares w/o erythema or drainage Eyes: Conjunctiva clear. Sclera non-icteric Neck: Supple.  Trachea midline Pulmonary:  Good air movement, no use of accessory muscles.  Cardiac: RRR, no JVD Vascular:  Vessel Right Left  Radial Palpable Palpable                          PT  1+ palpable  1+ palpable  DP Palpable  1+ palpable    Musculoskeletal: M/S 5/5 throughout.  No deformity or atrophy.  1+ right lower extremity edema, 1-2+ left lower extremity edema. Neurologic: Sensation grossly intact in extremities.  Symmetrical.  Speech is fluent.  Psychiatric: Judgment intact, Mood & affect appropriate for pt's clinical situation. Dermatologic: No rashes or ulcers noted.  No cellulitis or open wounds.       Labs Recent Results (from the past 2160 hour(s))  CUP PACEART REMOTE DEVICE CHECK     Status: None    Collection Time: 07/22/18  3:39 PM  Result Value Ref Range   Date Time Interrogation Session [id number]    Pulse Generator Manufacturer MERM    Pulse Gen Model [id number] Reveal LINQ    Pulse Gen Serial Number [id number]    Clinic Name [facility]    Implantable Pulse Generator Type ICM/ILR    Implantable Pulse Generator Implant Date [id number]    Eval Rhythm SR   Basic Metabolic Panel (BMET)     Status: None   Collection Time: 07/28/18  2:01 PM  Result Value Ref Range   Glucose 86 65 - 99 mg/dL   BUN 13 8 - 27 mg/dL   Creatinine, Ser 0.95 0.57 - 1.00 mg/dL   GFR calc non Af Amer 60 >59 mL/min/1.73   GFR calc Af Amer 69 >59 mL/min/1.73   BUN/Creatinine Ratio 14 12 - 28   Sodium 144 134 - 144 mmol/L   Potassium 4.6 3.5 - 5.2 mmol/L   Chloride 101 96 - 106 mmol/L   CO2 25 20 - 29 mmol/L   Calcium 9.3 8.7 - 10.3 mg/dL  CBC with Differential/Platelet     Status: None   Collection Time: 07/28/18  2:01 PM  Result Value Ref Range   WBC 7.1 3.4 - 10.8 x10E3/uL   RBC 4.79 3.77 - 5.28 x10E6/uL   Hemoglobin 14.4 11.1 - 15.9 g/dL   Hematocrit 41.9 34.0 - 46.6 %   MCV 88 79 - 97 fL   MCH 30.1 26.6 - 33.0 pg   MCHC 34.4 31.5 - 35.7 g/dL   RDW 12.6 12.3 - 15.4 %   Platelets 256 150 - 450 x10E3/uL   Neutrophils 73 Not Estab. %   Lymphs 17 Not Estab. %   Monocytes 10 Not Estab. %   Eos 0 Not Estab. %   Basos 0 Not Estab. %   Neutrophils Absolute 5.1 1.4 - 7.0 x10E3/uL   Lymphocytes Absolute 1.2 0.7 - 3.1 x10E3/uL   Monocytes Absolute 0.7 0.1 - 0.9 x10E3/uL   EOS (ABSOLUTE) 0.0 0.0 -  0.4 x10E3/uL   Basophils Absolute 0.0 0.0 - 0.2 x10E3/uL   Immature Granulocytes 0 Not Estab. %   Immature Grans (Abs) 0.0 0.0 - 0.1 x10E3/uL  CUP PACEART REMOTE DEVICE CHECK     Status: None   Collection Time: 08/23/18  4:39 PM  Result Value Ref Range   Date Time Interrogation Session [phone]    Pulse Generator Manufacturer MERM    Pulse Gen Model [id number] Reveal LINQ    Pulse Gen Serial Number  [id number]    Clinic Name [facility]    Implantable Pulse Generator Type ICM/ILR    Implantable Pulse Generator Implant Date [id number]   B Nat Peptide     Status: None   Collection Time: 09/08/18  2:29 PM  Result Value Ref Range   B Natriuretic Peptide 51.0 0.0 - 100.0 pg/mL    Comment: Performed at [facility], [address] [place], [place] [address]  TSH     Status: None   Collection Time: 09/08/18  2:29 PM  Result Value Ref Range   TSH 1.526 0.350 - 4.500 uIU/mL    Comment: Performed by a 3rd Generation assay with a functional sensitivity of <=0.01 uIU/mL. Performed at [facility], [address] [place], [place] [address]   Comprehensive metabolic panel     Status: Abnormal   Collection Time: 09/08/18  2:29 PM  Result Value Ref Range   Sodium 142 135 - 145 mmol/L   Potassium 4.3 3.5 - 5.1 mmol/L   Chloride 104 98 - 111 mmol/L   CO2 28 22 - 32 mmol/L   Glucose, Bld 117 (H) 70 - 99 mg/dL   BUN 14 8 - 23 mg/dL   Creatinine, Ser 0.88 0.44 - 1.00 mg/dL   Calcium 8.8 (L) 8.9 - 10.3 mg/dL   Total Protein 6.8 6.5 - 8.1 g/dL   Albumin 3.9 3.5 - 5.0 g/dL   AST 24 15 - 41 U/L   ALT 22 0 - 44 U/L   Alkaline Phosphatase 73 38 - 126 U/L   Total Bilirubin 0.8 0.3 - 1.2 mg/dL   GFR calc non Af Amer >60 >60 mL/min   GFR calc Af Amer >60 >60 mL/min   Anion gap 10 5 - 15    Comment: Performed at [facility], [address] [place], [place] [address]  CBC     Status: None   Collection Time: 09/08/18  2:29 PM  Result Value Ref Range   WBC 7.0 4.0 - 10.5 K/uL   RBC 4.58 3.87 - 5.11 MIL/uL   Hemoglobin 13.7 12.0 - 15.0 g/dL   HCT 43.0 36.0 - 46.0 %   MCV 93.9 80.0 - 100.0 fL   MCH 29.9 26.0 - 34.0 pg   MCHC 31.9 30.0 - 36.0 g/dL   RDW 13.4 11.5 - 15.5 %   Platelets 219 150 - 400 K/uL   nRBC 0.0 0.0 - 0.2 %    Comment: Performed at [facility], [address] [place], [place] [address]    Radiology No results  found.  Assessment/Plan Aneurysm of descending thoracic aorta (HCC) 3.8 cm.  At this level, no immediate risk of rupture.  This can be rechecked in a couple of years with a CT scan of the chest.  Cerebrovascular accident (CVA) due to embolism of precerebral artery ([place]) Could be causing some of her extremity symptoms but difficult to discern.  Chronic kidney disease, stage 3 (HCC) Can contribute to lower extremity  swelling  Lymphedema She has gotten significant improvement with the help of the lymphedema clinic.  Manual massage and compression have provided improvement in her symptoms.  She will continue this as well as ongoing compression.  They are going to be in treatment on her right leg shortly.  I will plan to see her back in about 6 months or sooner if problems develop in the interim.    Leotis Pain, MD  10/19/2018 10:39 AM    This note was created with Dragon medical transcription system.  Any errors from dictation are purely unintentional

## 2018-10-19 NOTE — Patient Instructions (Signed)

## 2018-10-19 NOTE — Assessment & Plan Note (Signed)
She has gotten significant improvement with the help of the lymphedema clinic.  Manual massage and compression have provided improvement in her symptoms.  She will continue this as well as ongoing compression.  They are going to be in treatment on her right leg shortly.  I will plan to see her back in about 6 months or sooner if problems develop in the interim.

## 2018-10-21 ENCOUNTER — Ambulatory Visit: Payer: Medicare Other | Attending: Family Medicine | Admitting: Occupational Therapy

## 2018-10-21 DIAGNOSIS — I890 Lymphedema, not elsewhere classified: Secondary | ICD-10-CM | POA: Diagnosis not present

## 2018-10-21 NOTE — Therapy (Signed)
[place] MAIN [facility] SERVICES [address] [place], [name], [address] Phone: [phone]   Fax:  [phone]  Occupational Therapy Treatment  Patient Details  Name: Sharon [name] MRN: [id number] Date of Birth: May 19, 1946 No data recorded  Encounter Date: 10/21/2018  OT End of Session - 10/21/18 1404    Visit Number  9    Number of Visits  36    Date for OT Re-Evaluation  11/29/18    OT Start Time  1002    OT Stop Time  1210    OT Time Calculation (min)  128 min    Activity Tolerance  Patient tolerated treatment well;No increased pain    Behavior During Therapy  WFL for tasks assessed/performed       Past Medical History:  Diagnosis Date  . Anxiety   . Arthritis    hands, upper back  . Asthma   . COPD (chronic obstructive pulmonary disease) ([place])   . History of cervical cancer   . Menopausal disorder   . Osteoporosis   . Pneumonia 1960  . Spasm of abdominal muscles of right side    intermittent  . TMJ (dislocation of temporomandibular joint)   . Wears dentures    partial lower    Past Surgical History:  Procedure Laterality Date  . ABDOMINAL HYSTERECTOMY  1970's  . bladder botox  2005  . BLADDER SUSPENSION  2004  . CATARACT EXTRACTION W/PHACO Right 03/09/2018   Procedure: CATARACT EXTRACTION PHACO AND INTRAOCULAR LENS PLACEMENT ([place]) right;  Surgeon: Eulogio Bear, MD;  Location: [place];  Service: Ophthalmology;  Laterality: Right;  CALL CELL 1ST  . CATARACT EXTRACTION W/PHACO Left 04/19/2018   Procedure: CATARACT EXTRACTION PHACO AND INTRAOCULAR LENS PLACEMENT ([facility])  LEFT;  Surgeon: Eulogio Bear, MD;  Location: [place];  Service: Ophthalmology;  Laterality: Left;  . COLONOSCOPY WITH PROPOFOL N/A 12/13/2015   Procedure: COLONOSCOPY WITH PROPOFOL;  Surgeon: Lucilla Lame, MD;  Location: [place];  Service: Endoscopy;  Laterality: N/A;  . LOOP RECORDER INSERTION N/A 01/07/2018   Procedure: LOOP RECORDER INSERTION;  Surgeon: Deboraha Sprang, MD;  Location: [place] [facility];  Service: Cardiovascular;  Laterality: N/A;  . POLYPECTOMY N/A 12/13/2015   Procedure: POLYPECTOMY;  Surgeon: Lucilla Lame, MD;  Location: [place];  Service: Endoscopy;  Laterality: N/A;  SIGMOID COLON POLYPS X  5  . SHOULDER ARTHROSCOPY W/ ROTATOR CUFF REPAIR Right 1998  . TEE WITHOUT CARDIOVERSION N/A 01/06/2018   Procedure: TRANSESOPHAGEAL ECHOCARDIOGRAM (TEE);  Surgeon: Minna Merritts, MD;  Location: [facility];  Service: Cardiovascular;  Laterality: N/A;  . TONSILLECTOMY AND ADENOIDECTOMY      There were no vitals filed for this visit.  Subjective Assessment - 10/21/18 1009    Subjective   Mrs Toelle presemnts for OT Rx visit 9/ 36 to address BLE LE. Pt is  unaccompanied during Rx session. Pt returns loned, thigh lebngth compression stocking and paper slippie. "I used it over the holidays and it felt so good." Pt reports she saw Dr Lucky Cowboy during visit interval and he was pleased with  condition of her legs. She returns to him n 6 months for f/u.    Patient is accompained by:  Family member    Pertinent History  Hx anxiety, COPD, OA, Asthma, hx cervical ca, osteoporosis, hx pneumonia, hx CVA, hx LLE cellulitis, hx L leg wound, hx falls    Limitations  chronic BLE leg swelling and  associated pain, decreased balance, fall risk, decreased standing, walking and sitting tolerance, difficulty w/ lower body bathing and dressing, difficulty fitting LB clothing and shoes, unable to drive, impaired transfers and functional mobility, impaired social participation, impaired ability to perform household chores, cooking, meal prep    Repetition  Increases Symptoms    Special Tests  Dopler negative for DVT    Patient Stated Goals  reduce leg pain and swelling so I can do more    Currently in Pain?  Yes    Pain Score  5     Pain Location  Leg    Pain Orientation  Right;Left    Pain Descriptors /  Indicators  Pressure;Tightness;Heaviness;Discomfort;Tiring;Stabbing;Sharp;Cramping    Pain Type  Chronic pain                   OT Treatments/Exercises (OP) - 10/21/18 0001      ADLs   ADL Education Given  Yes      Manual Therapy   Manual Therapy  Edema management;Compression Bandaging;Manual Lymphatic Drainage (MLD)    Edema Management  skin care to RLE with low ph [name] ( skin grade castor oil) throughuot MLD to increase skin hydration and mobilit y    Manual Lymphatic Drainage (MLD)  To reduce limb size, limit infection risk and limit progression of chronic , progressive LE, MLD to BLE using functional L inguinal LN. Omitted lateral neck portion of "short neck sequence" due to hx of stroke. Modified neck sequence included clavicular strokes only. Pt activated deep abdominal lymphatics ising diaphragmatic breathing after skiilled instruction. Tulized inguinal LN and worked in proximal to distal sequence  from thigh to toes. Completed MLD with 3 full sequemces  from distal to proximal pathways in rhwetrograde fashion. Good tolerance.    Compression Bandaging  LLE knee length , gradient compression wraps from toes to tibial tuberosity to reduce limb volume and facilitate actigve lymphatic decongestion. Thigh well decongested today, so no wraps needed.             OT Education - 10/21/18 1404    Education Details  Pt edu for simple self-MLD- J stroke and entire sequence- to ensure independence w/ LE self care home program    Person(s) Educated  Patient;Spouse    Methods  Explanation;Demonstration;Handout;Tactile cues;Verbal cues    Comprehension  Verbalized understanding;Returned demonstration;Need further instruction;Verbal cues required;Tactile cues required          OT Long Term Goals - 09/23/18 1759      OT LONG TERM GOAL #1   Title  Pt will demonstrate understanding of lymphedema (LE) precautions / prevention principals, including signs / symptoms of  cellulitis infection with modified independence using LE Workbook as printed reference to identify 6 precautions without verbal cues by end of 3rd  OT Rx visit.     Baseline  Max A    Time  3    Period  Days    Status  Achieved      OT LONG TERM GOAL #2   Title  Pt will be able to apply knee-length, multi-layer, short stretch compression wraps using correct gradient techniques with moderate  caregiver assistance to achieve optimal limb volume reduction during Intensive Phase CDT, and to return affected limb(s) as closely as possible, to premorbid size and shape.    Baseline  Max A    Time  2    Period  Weeks    Status  Achieved      OT  LONG TERM GOAL #3   Title  Pt to achieve at least  10% limb volume reduction in affected limb(s)  bilaterally during Intensive Phase CDT to control limb swelling, to improve tissue integrity and immune function, to improve ADLs performance and to improve functional mobility/ transfer, and to improve body image and self-esteem.    Baseline  dependent 09/23/2018 : Met for leg and thigh with 13.24% and 14.02% reductions. Full leg volume goal nearly achieved with 9.03% volume reduction.    Time  12    Period  Weeks    Status  Partially Met      OT LONG TERM GOAL #4   Title  Pt will achieve 100% compliance with daily LE self-care home program components, including daily  skin care, simple self-MLD, gradient compression wraps/ compression garments/devices, and therapeutic exercise with Mod CG support to ensure optimal Intensive Phase limb volume reduction to expedite compression garment/ device fitting.    Baseline  dependent    Time  12    Period  Weeks    Status  Partially Met      OT LONG TERM GOAL #5   Title  Pt will use appropriate  assistive devices during LE self-care training to don compression garments/devices with modified independence to ensure optimal LE self-management over time and to limit progression of chronic LE.    Baseline  Max A    Time  12     Period  Weeks    Status  New      OT LONG TERM GOAL #6   Title  Pt will retain optimal limb volume reductions achieved during Intensive Phase CDT with no more than 3% volume increase with ongoing CG assistance to limit LE progression and further functional decline.    Baseline  Max A    Time  6    Period  Months    Status  New            Plan - 10/21/18 1529    Clinical Impression Statement  Ptlast seen 09/30/18. She has done well with LE self management over visit interval using self care protocols and strategies learned in clinic. Legs are more swollen and indurated than last seen, but only mild decline compared with condition when commencing OT. ZLoaned ccl 2 thigh high provid4ed accessible, comfortable LLE compression, but apears not to have adequately contained swelling and protien build up. DME provider responded to email  today stating Pt has no benefits coverage for comrpession with Medicare only. Pt toerated BLE manual therapy, skin care and LLE knee length compression with excellent tolerance today. By end of session she was able to perform J stroke and short neck sequence of MLD with min assist. Handout provided. Cont as per POC. Advance Pt edu for MLD next session and discuss compression garment alternatives.    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   Rehab Potential  Good    OT Frequency  3x / week    OT Duration  12 weeks    OT Treatment/Interventions  Self-care/ADL training;Therapeutic exercise;DME and/or AE instruction;Therapist, nutritional;Compression bandaging;Balance training;Other (comment);Manual Therapy;Coping strategies training;Patient/family education;Therapeutic activities;Manual lymph drainage;Energy conservation   skin care with low ph lotion   Clinical Decision Making  Several treatment  options, min-mod task modification necessary    OT Home Exercise Plan  lymphatic pumping ther ex    Recommended Other  Services  fit w/ ccl 2 knee length compression stockings and convoluted HOS devices to decrease fibrosis    Consulted and Agree with Plan of Care  Patient;Family member/caregiver    Family Member Consulted  spouse       Patient will benefit from skilled therapeutic intervention in order to improve the following deficits and impairments:  Abnormal gait, Decreased balance, Decreased mobility, Decreased skin integrity, Difficulty walking, Decreased range of motion, Increased edema, Pain, Decreased activity tolerance, Decreased knowledge of use of DME, Impaired flexibility  Visit Diagnosis: Lymphedema, not elsewhere classified    Problem List Patient Active Problem List   Diagnosis Date Noted  . Senile purpura ([place]) 07/28/2018  . Chronic venous insufficiency 07/14/2018  . Lymphedema 07/14/2018  . Aneurysm of descending thoracic aorta (H[facility]) 05/04/2018  . Allergic rhinitis 02/16/2018  . Aortic atherosclerosis ([place]) 01/13/2018  . Status post placement of implantable loop recorder 01/12/2018  . Left-sided weakness 01/12/2018  . Chronic kidney disease, stage 3 ([place]) 01/12/2018  . Cerebrovascular accident (CVA) due to embolism of precerebral artery ([place])   . TIA (transient ischemic attack) 01/04/2018  . Osteoarthritis of wrist 12/09/2017  . Piriformis syndrome, left 11/25/2017  . CAD (coronary artery disease) 11/25/2017  . Caregiver stress 11/25/2017  . Menopause 08/14/2017  . Abnormal weight gain 08/14/2017  . De Quervain's tenosynovitis, right 07/17/2017  . Stress incontinence 06/19/2017  . Personal history of tobacco use, presenting hazards to health 04/01/2017  . Anxiety 03/13/2017  . Glossitis 11/14/2016  . Advanced care planning/counseling discussion 10/17/2016  . Other fecal abnormalities   . Benign neoplasm of sigmoid colon   . COPD (chronic obstructive  pulmonary disease) ([place]) 10/17/2015  . Mass of upper inner quadrant of right breast 06/22/2015   Andrey Spearman, MS, OTR/L, [facility] 10/21/18 3:35 PM'   [place] MAIN [facility] SERVICES [address] [place], [name], [address] Phone: [phone]   Fax:  [phone]  Name: Sharon [name] MRN: [id number] Date of Birth: [birth year]

## 2018-10-26 ENCOUNTER — Ambulatory Visit: Payer: Medicare Other | Admitting: Occupational Therapy

## 2018-10-26 ENCOUNTER — Ambulatory Visit (INDEPENDENT_AMBULATORY_CARE_PROVIDER_SITE_OTHER): Payer: Medicare Other | Admitting: Vascular Surgery

## 2018-10-26 ENCOUNTER — Encounter

## 2018-10-26 ENCOUNTER — Encounter (INDEPENDENT_AMBULATORY_CARE_PROVIDER_SITE_OTHER): Payer: Medicare Other

## 2018-10-26 DIAGNOSIS — I890 Lymphedema, not elsewhere classified: Secondary | ICD-10-CM | POA: Diagnosis not present

## 2018-10-26 NOTE — Therapy (Signed)
[place] MAIN [facility] SERVICES [address] [place], [name], [address] Phone: [phone]   Fax:  [phone]  Occupational Therapy Treatment Note and Progress Report  Reporting period: 08/31/18 - 10/26/2018  Patient Details  Name: Sharon [name] MRN: [id number] Date of Birth: [birth year] No data recorded  Encounter Date: 10/26/2018  OT End of Session - 10/26/18 1424    Visit Number  10    Number of Visits  36    Date for OT Re-Evaluation  11/29/18    Authorization Type  Visit 10 Progress Report completed 10/26/2018 for Reporting Period 08/31/2018 - 10/26/2018    OT Start Time  1010    OT Stop Time  1100    OT Time Calculation (min)  50 min    Activity Tolerance  Patient tolerated treatment well;No increased pain    Behavior During Therapy  WFL for tasks assessed/performed       Past Medical History:  Diagnosis Date  . Anxiety   . Arthritis    hands, upper back  . Asthma   . COPD (chronic obstructive pulmonary disease) ([place])   . History of cervical cancer   . Menopausal disorder   . Osteoporosis   . Pneumonia 1960  . Spasm of abdominal muscles of right side    intermittent  . TMJ (dislocation of temporomandibular joint)   . Wears dentures    partial lower    Past Surgical History:  Procedure Laterality Date  . ABDOMINAL HYSTERECTOMY  1970's  . bladder botox  2005  . BLADDER SUSPENSION  2004  . CATARACT EXTRACTION W/PHACO Right 03/09/2018   Procedure: CATARACT EXTRACTION PHACO AND INTRAOCULAR LENS PLACEMENT ([place]) right;  Surgeon: Eulogio Bear, MD;  Location: [place];  Service: Ophthalmology;  Laterality: Right;  CALL CELL 1ST  . CATARACT EXTRACTION W/PHACO Left 04/19/2018   Procedure: CATARACT EXTRACTION PHACO AND INTRAOCULAR LENS PLACEMENT ([facility])  LEFT;  Surgeon: Eulogio Bear, MD;  Location: [place];  Service: Ophthalmology;  Laterality: Left;  . COLONOSCOPY WITH PROPOFOL N/A 12/13/2015    Procedure: COLONOSCOPY WITH PROPOFOL;  Surgeon: Lucilla Lame, MD;  Location: [place];  Service: Endoscopy;  Laterality: N/A;  . LOOP RECORDER INSERTION N/A 01/07/2018   Procedure: LOOP RECORDER INSERTION;  Surgeon: Deboraha Sprang, MD;  Location: [place] [facility];  Service: Cardiovascular;  Laterality: N/A;  . POLYPECTOMY N/A 12/13/2015   Procedure: POLYPECTOMY;  Surgeon: Lucilla Lame, MD;  Location: [place];  Service: Endoscopy;  Laterality: N/A;  SIGMOID COLON POLYPS X  5  . SHOULDER ARTHROSCOPY W/ ROTATOR CUFF REPAIR Right 1998  . TEE WITHOUT CARDIOVERSION N/A 01/06/2018   Procedure: TRANSESOPHAGEAL ECHOCARDIOGRAM (TEE);  Surgeon: Minna Merritts, MD;  Location: [facility];  Service: Cardiovascular;  Laterality: N/A;  . TONSILLECTOMY AND ADENOIDECTOMY      There were no vitals filed for this visit.  Subjective Assessment - 10/26/18 1019    Subjective   Sharon [name] presemnts for OT Rx visit 10/ 36 to address BLE LE. Pt is  unaccompanied. iNFORMED Pt that [facility] vendor confirmed that her Medicare benefits do not provide funding for compression garments.     Patient is accompained by:  Family member    Pertinent History  Hx anxiety, COPD, OA, Asthma, hx cervical ca, osteoporosis, hx pneumonia, hx CVA, hx LLE cellulitis, hx L leg wound, hx falls    Limitations  chronic BLE leg swelling and associated pain, decreased balance, fall  risk, decreased standing, walking and sitting tolerance, difficulty w/ lower body bathing and dressing, difficulty fitting LB clothing and shoes, unable to drive, impaired transfers and functional mobility, impaired social participation, impaired ability to perform household chores, cooking, meal prep    Repetition  Increases Symptoms    Special Tests  Dopler negative for DVT    Patient Stated Goals  reduce leg pain and swelling so I can do more    Currently in Pain?  Yes    Pain Score  5     Pain Location  Leg    Pain Orientation  Right;Left     Pain Descriptors / Indicators  Sore;Tender;Pressure;Discomfort;Numbness;Tightness;Heaviness;Tiring    Pain Type  Chronic pain                   OT Treatments/Exercises (OP) - 10/26/18 0001      ADLs   ADL Comments  Provided skilled Pt edu reguarding compression garment options and recommendations in preparation for fitting thigh high stocks. Provided printed resources and ordering specifics for online vendors.    ADL Education Given  Yes      Manual Therapy   Manual Therapy  Edema management;Compression Bandaging;Manual Lymphatic Drainage (MLD)    Edema Management  skin care to RLE with low ph [name] ( skin grade castor oil) throughuot MLD to increase skin hydration and mobilit y    Manual Lymphatic Drainage (MLD)  To reduce limb size, limit infection risk and limit progression of chronic , progressive LE, MLD to BLE using functional L inguinal LN. Omitted lateral neck portion of "short neck sequence" due to hx of stroke. Modified neck sequence included clavicular strokes only. Pt activated deep abdominal lymphatics ising diaphragmatic breathing after skiilled instruction. Tulized inguinal LN and worked in proximal to distal sequence  from thigh to toes. Completed MLD with 3 full sequemces  from distal to proximal pathways in rhwetrograde fashion. Good tolerance.    Compression Bandaging  Pt declined compression wraps today. Pt Ily donned existing OTS compression socks and will apply wraps at home due to rainy weather.             OT Education - 10/26/18 1424    Education Details  See TREATMENT flow sheet for details    Person(s) Educated  Patient    Methods  Explanation;Demonstration    Comprehension  Verbalized understanding;Returned demonstration          OT Long Term Goals - 10/26/18 1427      OT LONG TERM GOAL #1   Title  Pt will demonstrate understanding of lymphedema (LE) precautions / prevention principals, including signs / symptoms of cellulitis  infection with modified independence using LE Workbook as printed reference to identify 6 precautions without verbal cues by end of 3rd  OT Rx visit.     Baseline  Max A    Time  3    Period  Days    Status  Achieved    Target Date  --   see imnitial evel dated 08/31/18 for target dates     OT LONG TERM GOAL #2   Title  Pt will be able to apply knee-length, multi-layer, short stretch compression wraps using correct gradient techniques with moderate  caregiver assistance to achieve optimal limb volume reduction during Intensive Phase CDT, and to return affected limb(s) as closely as possible, to premorbid size and shape.    Baseline  Max A    Time  2  Period  Weeks    Status  Achieved    Target Date  --   ee imnitial evel dated 08/31/18 for target dates     OT LONG TERM GOAL #3   Title  Pt to achieve at least  10% limb volume reduction in affected limb(s)  bilaterally during Intensive Phase CDT to control limb swelling, to improve tissue integrity and immune function, to improve ADLs performance and to improve functional mobility/ transfer, and to improve body image and self-esteem.    Baseline  dependent 09/23/2018 : Met for leg and thigh with 13.24% and 14.02% reductions. Full leg volume goal nearly achieved with 9.03% volume reduction.    Time  12    Period  Weeks    Status  Partially Met    Target Date  --   ee imnitial evel dated 08/31/18 for target dates     OT LONG TERM GOAL #4   Title  Pt will achieve 100% compliance with daily LE self-care home program components, including daily  skin care, simple self-MLD, gradient compression wraps/ compression garments/devices, and therapeutic exercise with Mod CG support to ensure optimal Intensive Phase limb volume reduction to expedite compression garment/ device fitting.    Baseline  dependent  10/26/2018: Pt ~ 50% compliant with 23/7 knee length compression wraps during clinical visit intervals. Pt using existing OTS compression knee  highs on BLE  ( non clinical grade compression) when not wrapped    Time  12    Period  Weeks    Status  Partially Met    Target Date  --   See imnitial evel dated 08/31/18 for target dates     OT LONG TERM GOAL #5   Title  Pt will use appropriate  assistive devices during LE self-care training to don compression garments/devices with modified independence to ensure optimal LE self-management over time and to limit progression of chronic LE.    Baseline  Max A    Time  12    Period  Weeks    Status  On-going    Target Date  --   See imnitial evel dated 08/31/18 for target dates     OT LONG TERM GOAL #6   Title  Pt will retain optimal limb volume reductions achieved during Intensive Phase CDT with no more than 3% volume increase with ongoing CG assistance to limit LE progression and further functional decline.    Baseline  Max A    Time  6    Period  Months    Status  On-going    Target Date  --   See imnitial evel dated 08/31/18 for target dates           Plan - 10/26/18 1433    Clinical Impression Statement  Pt demonstrates good tolerance for  and full engagement in all aspects of manual therapy and Pt ADL traning today.  Discussed compression garment recommendations , botyh clinical and financial issues, and agreed on fitting with off-the-shelf (OTS), [employer] brand, circular knit, thigh length, ccl 2 ( 30-40 mmHg), size II, regular lerngth, open toe elastic compression stockings with silicone top band. Pt will order using printedresources provided today. [employer] insurance benefits do not assist w/ funding for needed , custom, flat knit lymphedema compression  garments needed in Sharon [name]'s case, Pt will utilize OTS stockings as they are more affrdable. Pt demonstrates progress towards most OT goals for LE care today. She has achieved goals for LE precautions,  volumeric reduction and compression wraps application, But she is only ~ 50% compliant with 23/7 compression wraps  bilaterally. Pt is working on learning simple self MLD each day in clinic. We'll stress skilled edu for this aspect of self care over the next 2 weeks whie awaiting garment fitting.  Cont as per OC.    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   Rehab Potential  Good    OT Frequency  3x / week    OT Duration  12 weeks    OT Treatment/Interventions  Self-care/ADL training;Therapeutic exercise;DME and/or AE instruction;Therapist, nutritional;Compression bandaging;Balance training;Other (comment);Manual Therapy;Coping strategies training;Patient/family education;Therapeutic activities;Manual lymph drainage;Energy conservation   skin care with low ph lotion   Clinical Decision Making  Several treatment options, min-mod task modification necessary    OT Home Exercise Plan  lymphatic pumping ther ex    Recommended Other Services  fit w/ ccl 2 knee length compression stockings and convoluted HOS devices to decrease fibrosis    Consulted and Agree with Plan of Care  Patient;Family member/caregiver    Family Member Consulted  spouse       Patient will benefit from skilled therapeutic intervention in order to improve the following deficits and impairments:  Abnormal gait, Decreased balance, Decreased mobility, Decreased skin integrity, Difficulty walking, Decreased range of motion, Increased edema, Pain, Decreased activity tolerance, Decreased knowledge of use of DME, Impaired flexibility  Visit Diagnosis: Lymphedema, not elsewhere classified    Problem List Patient Active Problem List   Diagnosis Date Noted  . Senile purpura ([place]) 07/28/2018  . Chronic venous insufficiency 07/14/2018  . Lymphedema 07/14/2018  . Aneurysm of descending thoracic aorta (HCC) 05/04/2018  . Allergic rhinitis 02/16/2018  . Aortic atherosclerosis ([place])  01/13/2018  . Status post placement of implantable loop recorder 01/12/2018  . Left-sided weakness 01/12/2018  . Chronic kidney disease, stage 3 ([place]) 01/12/2018  . Cerebrovascular accident (CVA) due to embolism of precerebral artery ([place])   . TIA (transient ischemic attack) 01/04/2018  . Osteoarthritis of wrist 12/09/2017  . Piriformis syndrome, left 11/25/2017  . CAD (coronary artery disease) 11/25/2017  . Caregiver stress 11/25/2017  . Menopause 08/14/2017  . Abnormal weight gain 08/14/2017  . De Quervain's tenosynovitis, right 07/17/2017  . Stress incontinence 06/19/2017  . Personal history of tobacco use, presenting hazards to health 04/01/2017  . Anxiety 03/13/2017  . Glossitis 11/14/2016  . Advanced care planning/counseling discussion 10/17/2016  . Other fecal abnormalities   . Benign neoplasm of sigmoid colon   . COPD (chronic obstructive pulmonary disease) ([place]) 10/17/2015  . Mass of upper inner quadrant of right breast 06/22/2015    Andrey Spearman, MS, OTR/L, [facility] 10/26/18 2:49 PM  [place] MAIN [facility] SERVICES [address] [place], [name], [address] Phone: [phone]   Fax:  [phone]  Name: CHOSEN GARRON MRN: [id number] Date of Birth: Feb 02, 1946

## 2018-10-26 NOTE — Patient Instructions (Signed)

## 2018-10-28 ENCOUNTER — Ambulatory Visit: Payer: Medicare Other | Admitting: Occupational Therapy

## 2018-10-28 ENCOUNTER — Other Ambulatory Visit: Payer: Self-pay

## 2018-10-28 ENCOUNTER — Encounter: Payer: Self-pay | Admitting: Family Medicine

## 2018-10-28 ENCOUNTER — Ambulatory Visit (INDEPENDENT_AMBULATORY_CARE_PROVIDER_SITE_OTHER): Payer: Medicare Other

## 2018-10-28 ENCOUNTER — Ambulatory Visit (INDEPENDENT_AMBULATORY_CARE_PROVIDER_SITE_OTHER): Payer: Medicare Other | Admitting: Family Medicine

## 2018-10-28 VITALS — BP 127/73 | HR 93 | Temp 98.9°F | Ht 69.0 in | Wt 211.0 lb

## 2018-10-28 DIAGNOSIS — I639 Cerebral infarction, unspecified: Secondary | ICD-10-CM | POA: Diagnosis not present

## 2018-10-28 DIAGNOSIS — R062 Wheezing: Secondary | ICD-10-CM | POA: Diagnosis not present

## 2018-10-28 DIAGNOSIS — J111 Influenza due to unidentified influenza virus with other respiratory manifestations: Secondary | ICD-10-CM

## 2018-10-28 DIAGNOSIS — R509 Fever, unspecified: Secondary | ICD-10-CM | POA: Diagnosis not present

## 2018-10-28 MED ORDER — GUAIFENESIN ER 600 MG PO TB12: 600.0000 mg | ORAL_TABLET | Freq: Two times a day (BID) | ORAL | 0 refills | Status: DC

## 2018-10-28 MED ORDER — BENZONATATE 200 MG PO CAPS: 200.0000 mg | ORAL_CAPSULE | Freq: Three times a day (TID) | ORAL | 0 refills | Status: DC | PRN

## 2018-10-28 MED ORDER — BALOXAVIR MARBOXIL(80 MG DOSE) 2 X 40 MG PO TBPK: 80.0000 mg | ORAL_TABLET | Freq: Once | ORAL | 0 refills | Status: AC

## 2018-10-28 MED ORDER — HYDROCOD POLST-CPM POLST ER 10-8 MG/5ML PO SUER: 5.0000 mL | Freq: Two times a day (BID) | ORAL | 0 refills | Status: DC | PRN

## 2018-10-28 NOTE — Progress Notes (Signed)
BP 127/73   Pulse 93   Temp 98.9 F (37.2 C) (Oral)   Ht 5\' 9"  (1.753 m)   Wt 211 lb (95.7 kg)   LMP  (LMP Unknown)   SpO2 98%   BMI 31.16 kg/m    Subjective:    Patient ID: Sharon [name], female    DOB: [birth year], 73 y.o.   MRN: [id number]  HPI: Sharon [name] is a 73 y.o. female  Chief Complaint  Patient presents with  . Cough    pt states greenish mucus, painful when cough x about 2 days  . Chills    cold sweats and hard to breath,   . Fever    low grade   2 day hx of low grade fevers, chills, sweats, productive cough with green sputum. Taking tylenol with very mild relief. No CP, SOB, HAs, dizziness, N/V/D. Hx of asthma on albuterol and symbicort.   Relevant past medical, surgical, family and social history reviewed and updated as indicated. Interim medical history since our last visit reviewed. Allergies and medications reviewed and updated.  Review of Systems  Per HPI unless specifically indicated above     Objective:    BP 127/73   Pulse 93   Temp 98.9 F (37.2 C) (Oral)   Ht 5\' 9"  (1.753 m)   Wt 211 lb (95.7 kg)   LMP  (LMP Unknown)   SpO2 98%   BMI 31.16 kg/m   Wt Readings from Last 3 Encounters:  10/28/18 211 lb (95.7 kg)  10/19/18 216 lb 9.6 oz (98.2 kg)  09/08/18 215 lb (97.5 kg)    Physical Exam Vitals signs and nursing note reviewed.  Constitutional:      Appearance: Normal appearance.  HENT:     Head: Atraumatic.     Nose: Rhinorrhea present.     Mouth/Throat:     Pharynx: Posterior oropharyngeal erythema present.  Eyes:     Extraocular Movements: Extraocular movements intact.     Conjunctiva/sclera: Conjunctivae normal.  Neck:     Musculoskeletal: Normal range of motion and neck supple.  Cardiovascular:     Rate and Rhythm: Normal rate and regular rhythm.  Pulmonary:     Effort: Pulmonary effort is normal.     Breath sounds: Normal breath sounds. No wheezing or rales.  Musculoskeletal: Normal range of motion.  Skin:  General: Skin is warm and dry.  Neurological:     Mental Status: She is alert and oriented to person, place, and time. Mental status is at baseline.  Psychiatric:        Behavior: Behavior normal.        Thought Content: Thought content normal.        Judgment: Judgment normal.     Results for orders placed or performed during the hospital encounter of 09/08/18  B Nat Peptide  Result Value Ref Range   B Natriuretic Peptide 51.0 0.0 - 100.0 pg/mL  TSH  Result Value Ref Range   TSH 1.526 0.350 - 4.500 uIU/mL  Comprehensive metabolic panel  Result Value Ref Range   Sodium 142 135 - 145 mmol/L   Potassium 4.3 3.5 - 5.1 mmol/L   Chloride 104 98 - 111 mmol/L   CO2 28 22 - 32 mmol/L   Glucose, Bld 117 (H) 70 - 99 mg/dL   BUN 14 8 - 23 mg/dL   Creatinine, Ser 0.88 0.44 - 1.00 mg/dL   Calcium 8.8 (L) 8.9 - 10.3 mg/dL   Total Protein  6.8 6.5 - 8.1 g/dL   Albumin 3.9 3.5 - 5.0 g/dL   AST 24 15 - 41 U/L   ALT 22 0 - 44 U/L   Alkaline Phosphatase 73 38 - 126 U/L   Total Bilirubin 0.8 0.3 - 1.2 mg/dL   GFR calc non Af Amer >60 >60 mL/min   GFR calc Af Amer >60 >60 mL/min   Anion gap 10 5 - 15  CBC  Result Value Ref Range   WBC 7.0 4.0 - 10.5 K/uL   RBC 4.58 3.87 - 5.11 MIL/uL   Hemoglobin 13.7 12.0 - 15.0 g/dL   HCT 43.0 36.0 - 46.0 %   MCV 93.9 80.0 - 100.0 fL   MCH 29.9 26.0 - 34.0 pg   MCHC 31.9 30.0 - 36.0 g/dL   RDW 13.4 11.5 - 15.5 %   Platelets 219 150 - 400 K/uL   nRBC 0.0 0.0 - 0.2 %      Assessment & Plan:   Problem List Items Addressed This Visit    None    Visit Diagnoses    Influenza    -  Primary   Rapid flu neg, but sxs consistent. Tx with xofluza, tessalon, tussionex, and mucinex. Supportive care and sedation precautions reviewed   Relevant Medications   Baloxavir Marboxil,80 MG Dose, (XOFLUZA) 2 x 40 MG TBPK   Other Relevant Orders   Veritor Flu A/B Waived   Wheezing       Continue inhalers,call if worsening or not improving       Follow up  plan: Return for as scheduled.

## 2018-10-29 LAB — VERITOR FLU A/B WAIVED
Influenza A: NEGATIVE
Influenza B: NEGATIVE

## 2018-10-29 NOTE — Progress Notes (Signed)
Carelink Summary Report / Loop Recorder 

## 2018-10-30 LAB — CUP PACEART REMOTE DEVICE CHECK
Date Time Interrogation Session: 20200116170700
Implantable Pulse Generator Implant Date: 20190328

## 2018-11-02 ENCOUNTER — Ambulatory Visit: Payer: Medicare Other | Admitting: Occupational Therapy

## 2018-11-04 ENCOUNTER — Ambulatory Visit: Payer: Medicare Other | Admitting: Occupational Therapy

## 2018-11-04 LAB — CUP PACEART REMOTE DEVICE CHECK
Date Time Interrogation Session: 20191214163610
Implantable Pulse Generator Implant Date: 20190328

## 2018-11-05 ENCOUNTER — Encounter: Payer: Self-pay | Admitting: Family Medicine

## 2018-11-09 ENCOUNTER — Ambulatory Visit: Payer: Medicare Other | Admitting: Occupational Therapy

## 2018-11-09 DIAGNOSIS — I890 Lymphedema, not elsewhere classified: Secondary | ICD-10-CM

## 2018-11-09 NOTE — Therapy (Signed)
[place] MAIN [facility] SERVICES [address] [place], [name], [address] Phone: [phone]   Fax:  [phone]  Occupational Therapy Treatment  Patient Details  Name: Sharon [name] MRN: [id number] Date of Birth: [birth year] No data recorded  Encounter Date: 11/09/2018  OT End of Session - 11/09/18 1652    Visit Number  11    Number of Visits  36    Date for OT Re-Evaluation  11/29/18    Authorization Type  Visit 10 Progress Report completed 10/26/2018 for Reporting Period 08/31/2018 - 10/26/2018    OT Start Time  1010    OT Stop Time  1110    OT Time Calculation (min)  60 min    Activity Tolerance  Patient tolerated treatment well;No increased pain    Behavior During Therapy  WFL for tasks assessed/performed       Past Medical History:  Diagnosis Date  . Anxiety   . Arthritis    hands, upper back  . Asthma   . COPD (chronic obstructive pulmonary disease) ([place])   . History of cervical cancer   . Menopausal disorder   . Osteoporosis   . Pneumonia 1960  . Spasm of abdominal muscles of right side    intermittent  . TMJ (dislocation of temporomandibular joint)   . Wears dentures    partial lower    Past Surgical History:  Procedure Laterality Date  . ABDOMINAL HYSTERECTOMY  1970's  . bladder botox  2005  . BLADDER SUSPENSION  2004  . CATARACT EXTRACTION W/PHACO Right 03/09/2018   Procedure: CATARACT EXTRACTION PHACO AND INTRAOCULAR LENS PLACEMENT ([place]) right;  Surgeon: Eulogio Bear, MD;  Location: [place];  Service: Ophthalmology;  Laterality: Right;  CALL CELL 1ST  . CATARACT EXTRACTION W/PHACO Left 04/19/2018   Procedure: CATARACT EXTRACTION PHACO AND INTRAOCULAR LENS PLACEMENT (IOC)  LEFT;  Surgeon: Eulogio Bear, MD;  Location: [place];  Service: Ophthalmology;  Laterality: Left;  . COLONOSCOPY WITH PROPOFOL N/A 12/13/2015   Procedure: COLONOSCOPY WITH PROPOFOL;  Surgeon: Lucilla Lame, MD;  Location:  [place];  Service: Endoscopy;  Laterality: N/A;  . LOOP RECORDER INSERTION N/A 01/07/2018   Procedure: LOOP RECORDER INSERTION;  Surgeon: Deboraha Sprang, MD;  Location: [place] [facility];  Service: Cardiovascular;  Laterality: N/A;  . POLYPECTOMY N/A 12/13/2015   Procedure: POLYPECTOMY;  Surgeon: Lucilla Lame, MD;  Location: [place];  Service: Endoscopy;  Laterality: N/A;  SIGMOID COLON POLYPS X  5  . SHOULDER ARTHROSCOPY W/ ROTATOR CUFF REPAIR Right 1998  . TEE WITHOUT CARDIOVERSION N/A 01/06/2018   Procedure: TRANSESOPHAGEAL ECHOCARDIOGRAM (TEE);  Surgeon: Minna Merritts, MD;  Location: [facility];  Service: Cardiovascular;  Laterality: N/A;  . TONSILLECTOMY AND ADENOIDECTOMY      There were no vitals filed for this visit.  Subjective Assessment - 11/09/18 1648    Subjective   Mrs Coudriet presemnts for OT Rx visit 11/ 36 to address BLE LE. Pt was last seen for OT and CDT on Jan 14. Sjhe had to cx several appointments 2/2 the flu. Pt reprots, "I had my legs up the whole time. I couldn't wraps.    Patient is accompained by:  Family member    Pertinent History  Hx anxiety, COPD, OA, Asthma, hx cervical ca, osteoporosis, hx pneumonia, hx CVA, hx LLE cellulitis, hx L leg wound, hx falls    Limitations  chronic BLE leg swelling and associated pain, decreased balance,  fall risk, decreased standing, walking and sitting tolerance, difficulty w/ lower body bathing and dressing, difficulty fitting LB clothing and shoes, unable to drive, impaired transfers and functional mobility, impaired social participation, impaired ability to perform household chores, cooking, meal prep    Repetition  Increases Symptoms    Special Tests  Dopler negative for DVT    Patient Stated Goals  reduce leg pain and swelling so I can do more    Currently in Pain?  Yes    Pain Score  --   not rated numerically   Pain Location  Back    Pain Descriptors / Indicators  Other (Comment)   stiff, sore    Pain Type  Chronic pain                   OT Treatments/Exercises (OP) - 11/09/18 0001      ADLs   ADL Education Given  Yes      Manual Therapy   Manual Therapy  Edema management;Compression Bandaging;Manual Lymphatic Drainage (MLD)    Edema Management  skin care to RLE with low ph [name] ( skin grade castor oil) throughuot MLD to increase skin hydration and mobilit y    Manual Lymphatic Drainage (MLD)  To reduce limb size, limit infection risk and limit progression of chronic , progressive LE, MLD to BLE using functional L inguinal LN. Omitted lateral neck portion of "short neck sequence" due to hx of stroke. Modified neck sequence included clavicular strokes only. Pt activated deep abdominal lymphatics ising diaphragmatic breathing after skiilled instruction. Tulized inguinal LN and worked in proximal to distal sequence  from thigh to toes. Completed MLD with 3 full sequemces  from distal to proximal pathways in rhwetrograde fashion. Good tolerance.    Compression Bandaging  LLE knee length compression wraps as estab;ished             OT Education - 11/09/18 1651    Education Details  Cont Pt edu for simple self-MLD- reviewed neck sequence omitting lateral neck strokes 2/2 hx CVA    Person(s) Educated  Patient;Spouse    Methods  Explanation;Demonstration;Handout;Tactile cues;Verbal cues    Comprehension  Verbalized understanding;Returned demonstration;Need further instruction;Verbal cues required;Tactile cues required          OT Long Term Goals - 10/26/18 1427      OT LONG TERM GOAL #1   Title  Pt will demonstrate understanding of lymphedema (LE) precautions / prevention principals, including signs / symptoms of cellulitis infection with modified independence using LE Workbook as printed reference to identify 6 precautions without verbal cues by end of 3rd  OT Rx visit.     Baseline  Max A    Time  3    Period  Days    Status  Achieved    Target Date   --   see imnitial evel dated 08/31/18 for target dates     OT LONG TERM GOAL #2   Title  Pt will be able to apply knee-length, multi-layer, short stretch compression wraps using correct gradient techniques with moderate  caregiver assistance to achieve optimal limb volume reduction during Intensive Phase CDT, and to return affected limb(s) as closely as possible, to premorbid size and shape.    Baseline  Max A    Time  2    Period  Weeks    Status  Achieved    Target Date  --   ee imnitial evel dated 08/31/18 for target dates  OT LONG TERM GOAL #3   Title  Pt to achieve at least  10% limb volume reduction in affected limb(s)  bilaterally during Intensive Phase CDT to control limb swelling, to improve tissue integrity and immune function, to improve ADLs performance and to improve functional mobility/ transfer, and to improve body image and self-esteem.    Baseline  dependent 09/23/2018 : Met for leg and thigh with 13.24% and 14.02% reductions. Full leg volume goal nearly achieved with 9.03% volume reduction.    Time  12    Period  Weeks    Status  Partially Met    Target Date  --   ee imnitial evel dated 08/31/18 for target dates     OT LONG TERM GOAL #4   Title  Pt will achieve 100% compliance with daily LE self-care home program components, including daily  skin care, simple self-MLD, gradient compression wraps/ compression garments/devices, and therapeutic exercise with Mod CG support to ensure optimal Intensive Phase limb volume reduction to expedite compression garment/ device fitting.    Baseline  dependent  10/26/2018: Pt ~ 50% compliant with 23/7 knee length compression wraps during clinical visit intervals. Pt using existing OTS compression knee highs on BLE  ( non clinical grade compression) when not wrapped    Time  12    Period  Weeks    Status  Partially Met    Target Date  --   See imnitial evel dated 08/31/18 for target dates     OT LONG TERM GOAL #5   Title  Pt will  use appropriate  assistive devices during LE self-care training to don compression garments/devices with modified independence to ensure optimal LE self-management over time and to limit progression of chronic LE.    Baseline  Max A    Time  12    Period  Weeks    Status  On-going    Target Date  --   See imnitial evel dated 08/31/18 for target dates     OT LONG TERM GOAL #6   Title  Pt will retain optimal limb volume reductions achieved during Intensive Phase CDT with no more than 3% volume increase with ongoing CG assistance to limit LE progression and further functional decline.    Baseline  Max A    Time  6    Period  Months    Status  On-going    Target Date  --   See imnitial evel dated 08/31/18 for target dates           Plan - 11/09/18 1653    Clinical Impression Statement  BLE with mild increased swelling below knees. Thigh swelling on L is well decongested. B legs present with redness and cobblestone texture anteriorly in addition to 2+ anteriotr pitting. Pt toerated MLD and comnpression wraps to LLE without difficulty. Pt to make up appointments missed due to illness. Cont as per poc w/ ongoing progress towards all goals.    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   Rehab Potential  Good    OT Frequency  3x / week    OT Duration  12 weeks    OT Treatment/Interventions  Self-care/ADL training;Therapeutic exercise;DME and/or AE instruction;Therapist, nutritional;Compression bandaging;Balance training;Other (comment);Manual Therapy;Coping strategies training;Patient/family education;Therapeutic activities;Manual lymph drainage;Energy conservation   skin care with low ph lotion   Clinical Decision Making  Several treatment options, min-mod  task modification necessary    OT Home Exercise Plan  lymphatic pumping  ther ex    Recommended Other Services  fit w/ ccl 2 knee length compression stockings and convoluted HOS devices to decrease fibrosis    Consulted and Agree with Plan of Care  Patient;Family member/caregiver    Family Member Consulted  spouse       Patient will benefit from skilled therapeutic intervention in order to improve the following deficits and impairments:  Abnormal gait, Decreased balance, Decreased mobility, Decreased skin integrity, Difficulty walking, Decreased range of motion, Increased edema, Pain, Decreased activity tolerance, Decreased knowledge of use of DME, Impaired flexibility  Visit Diagnosis: Lymphedema, not elsewhere classified    Problem List Patient Active Problem List   Diagnosis Date Noted  . Senile purpura ([place]) 07/28/2018  . Chronic venous insufficiency 07/14/2018  . Lymphedema 07/14/2018  . Aneurysm of descending thoracic aorta (HCC) 05/04/2018  . Allergic rhinitis 02/16/2018  . Aortic atherosclerosis ([place]) 01/13/2018  . Status post placement of implantable loop recorder 01/12/2018  . Left-sided weakness 01/12/2018  . Chronic kidney disease, stage 3 ([place]) 01/12/2018  . Cerebrovascular accident (CVA) due to embolism of precerebral artery ([place])   . TIA (transient ischemic attack) 01/04/2018  . Osteoarthritis of wrist 12/09/2017  . Piriformis syndrome, left 11/25/2017  . CAD (coronary artery disease) 11/25/2017  . Caregiver stress 11/25/2017  . Menopause 08/14/2017  . Abnormal weight gain 08/14/2017  . De Quervain's tenosynovitis, right 07/17/2017  . Stress incontinence 06/19/2017  . Personal history of tobacco use, presenting hazards to health 04/01/2017  . Anxiety 03/13/2017  . Glossitis 11/14/2016  . Advanced care planning/counseling discussion 10/17/2016  . Other fecal abnormalities   . Benign neoplasm of sigmoid colon   . COPD (chronic obstructive pulmonary disease) ([place]) 10/17/2015  . Mass of upper inner quadrant of right breast 06/22/2015     Andrey Spearman, MS, OTR/L, [facility] 11/09/18 4:56 PM  [place] MAIN [facility] SERVICES [address] [place], [name], [address] Phone: [phone]   Fax:  [phone]  Name: AHNNA DUNGAN MRN: [id number] Date of Birth: 1946/02/26

## 2018-11-11 ENCOUNTER — Ambulatory Visit: Payer: Medicare Other | Admitting: Occupational Therapy

## 2018-11-11 DIAGNOSIS — I890 Lymphedema, not elsewhere classified: Secondary | ICD-10-CM | POA: Diagnosis not present

## 2018-11-11 NOTE — Therapy (Signed)
[place] MAIN [facility] SERVICES [address] [place], [name], [address] Phone: [phone]   Fax:  [phone]  Occupational Therapy Treatment  Patient Details  Name: Sharon [name] MRN: [id number] Date of Birth: 1945-11-25 No data recorded  Encounter Date: 11/11/2018  OT End of Session - 11/11/18 1846    Visit Number  12    Number of Visits  36    Date for OT Re-Evaluation  11/29/18    Authorization Type  Visit 10 Progress Report completed 10/26/2018 for Reporting Period 08/31/2018 - 10/26/2018    OT Start Time  1015    OT Stop Time  1110    OT Time Calculation (min)  55 min    Activity Tolerance  Patient tolerated treatment well;No increased pain    Behavior During Therapy  WFL for tasks assessed/performed       Past Medical History:  Diagnosis Date  . Anxiety   . Arthritis    hands, upper back  . Asthma   . COPD (chronic obstructive pulmonary disease) ([place])   . History of cervical cancer   . Menopausal disorder   . Osteoporosis   . Pneumonia 1960  . Spasm of abdominal muscles of right side    intermittent  . TMJ (dislocation of temporomandibular joint)   . Wears dentures    partial lower    Past Surgical History:  Procedure Laterality Date  . ABDOMINAL HYSTERECTOMY  1970's  . bladder botox  2005  . BLADDER SUSPENSION  2004  . CATARACT EXTRACTION W/PHACO Right 03/09/2018   Procedure: CATARACT EXTRACTION PHACO AND INTRAOCULAR LENS PLACEMENT ([place]) right;  Surgeon: Eulogio Bear, MD;  Location: [place];  Service: Ophthalmology;  Laterality: Right;  CALL CELL 1ST  . CATARACT EXTRACTION W/PHACO Left 04/19/2018   Procedure: CATARACT EXTRACTION PHACO AND INTRAOCULAR LENS PLACEMENT (IOC)  LEFT;  Surgeon: Eulogio Bear, MD;  Location: [place];  Service: Ophthalmology;  Laterality: Left;  . COLONOSCOPY WITH PROPOFOL N/A 12/13/2015   Procedure: COLONOSCOPY WITH PROPOFOL;  Surgeon: Lucilla Lame, MD;  Location:  [place];  Service: Endoscopy;  Laterality: N/A;  . LOOP RECORDER INSERTION N/A 01/07/2018   Procedure: LOOP RECORDER INSERTION;  Surgeon: Deboraha Sprang, MD;  Location: [place] [facility];  Service: Cardiovascular;  Laterality: N/A;  . POLYPECTOMY N/A 12/13/2015   Procedure: POLYPECTOMY;  Surgeon: Lucilla Lame, MD;  Location: [place];  Service: Endoscopy;  Laterality: N/A;  SIGMOID COLON POLYPS X  5  . SHOULDER ARTHROSCOPY W/ ROTATOR CUFF REPAIR Right 1998  . TEE WITHOUT CARDIOVERSION N/A 01/06/2018   Procedure: TRANSESOPHAGEAL ECHOCARDIOGRAM (TEE);  Surgeon: Minna Merritts, MD;  Location: [facility];  Service: Cardiovascular;  Laterality: N/A;  . TONSILLECTOMY AND ADENOIDECTOMY      There were no vitals filed for this visit.                             OT Long Term Goals - 10/26/18 1427      OT LONG TERM GOAL #1   Title  Pt will demonstrate understanding of lymphedema (LE) precautions / prevention principals, including signs / symptoms of cellulitis infection with modified independence using LE Workbook as printed reference to identify 6 precautions without verbal cues by end of 3rd  OT Rx visit.     Baseline  Max A    Time  3    Period  Days    Status  Achieved    Target Date  --   see imnitial evel dated 08/31/18 for target dates     OT LONG TERM GOAL #2   Title  Pt will be able to apply knee-length, multi-layer, short stretch compression wraps using correct gradient techniques with moderate  caregiver assistance to achieve optimal limb volume reduction during Intensive Phase CDT, and to return affected limb(s) as closely as possible, to premorbid size and shape.    Baseline  Max A    Time  2    Period  Weeks    Status  Achieved    Target Date  --   ee imnitial evel dated 08/31/18 for target dates     OT LONG TERM GOAL #3   Title  Pt to achieve at least  10% limb volume reduction in affected limb(s)  bilaterally during Intensive  Phase CDT to control limb swelling, to improve tissue integrity and immune function, to improve ADLs performance and to improve functional mobility/ transfer, and to improve body image and self-esteem.    Baseline  dependent 09/23/2018 : Met for leg and thigh with 13.24% and 14.02% reductions. Full leg volume goal nearly achieved with 9.03% volume reduction.    Time  12    Period  Weeks    Status  Partially Met    Target Date  --   ee imnitial evel dated 08/31/18 for target dates     OT LONG TERM GOAL #4   Title  Pt will achieve 100% compliance with daily LE self-care home program components, including daily  skin care, simple self-MLD, gradient compression wraps/ compression garments/devices, and therapeutic exercise with Mod CG support to ensure optimal Intensive Phase limb volume reduction to expedite compression garment/ device fitting.    Baseline  dependent  10/26/2018: Pt ~ 50% compliant with 23/7 knee length compression wraps during clinical visit intervals. Pt using existing OTS compression knee highs on BLE  ( non clinical grade compression) when not wrapped    Time  12    Period  Weeks    Status  Partially Met    Target Date  --   See imnitial evel dated 08/31/18 for target dates     OT LONG TERM GOAL #5   Title  Pt will use appropriate  assistive devices during LE self-care training to don compression garments/devices with modified independence to ensure optimal LE self-management over time and to limit progression of chronic LE.    Baseline  Max A    Time  12    Period  Weeks    Status  On-going    Target Date  --   See imnitial evel dated 08/31/18 for target dates     OT LONG TERM GOAL #6   Title  Pt will retain optimal limb volume reductions achieved during Intensive Phase CDT with no more than 3% volume increase with ongoing CG assistance to limit LE progression and further functional decline.    Baseline  Max A    Time  6    Period  Months    Status  On-going    Target  Date  --   See imnitial evel dated 08/31/18 for target dates             Patient will benefit from skilled therapeutic intervention in order to improve the following deficits and impairments:     Visit Diagnosis: Lymphedema, not elsewhere classified    Problem List  Patient Active Problem List   Diagnosis Date Noted  . Senile purpura ([place]) 07/28/2018  . Chronic venous insufficiency 07/14/2018  . Lymphedema 07/14/2018  . Aneurysm of descending thoracic aorta (HCC) 05/04/2018  . Allergic rhinitis 02/16/2018  . Aortic atherosclerosis ([place]) 01/13/2018  . Status post placement of implantable loop recorder 01/12/2018  . Left-sided weakness 01/12/2018  . Chronic kidney disease, stage 3 ([place]) 01/12/2018  . Cerebrovascular accident (CVA) due to embolism of precerebral artery ([place])   . TIA (transient ischemic attack) 01/04/2018  . Osteoarthritis of wrist 12/09/2017  . Piriformis syndrome, left 11/25/2017  . CAD (coronary artery disease) 11/25/2017  . Caregiver stress 11/25/2017  . Menopause 08/14/2017  . Abnormal weight gain 08/14/2017  . De Quervain's tenosynovitis, right 07/17/2017  . Stress incontinence 06/19/2017  . Personal history of tobacco use, presenting hazards to health 04/01/2017  . Anxiety 03/13/2017  . Glossitis 11/14/2016  . Advanced care planning/counseling discussion 10/17/2016  . Other fecal abnormalities   . Benign neoplasm of sigmoid colon   . COPD (chronic obstructive pulmonary disease) ([place]) 10/17/2015  . Mass of upper inner quadrant of right breast 06/22/2015     Andrey Spearman, MS, OTR/L, [facility] 11/11/18 6:48 PM   [place] MAIN [facility] SERVICES [address] [place], [name], [address] Phone: [phone]   Fax:  [phone]  Name: JERMANI PUND MRN: [id number] Date of Birth: 08-16-1946

## 2018-11-16 ENCOUNTER — Encounter: Payer: Self-pay | Admitting: Occupational Therapy

## 2018-11-16 ENCOUNTER — Ambulatory Visit: Payer: Medicare Other | Attending: Family Medicine | Admitting: Occupational Therapy

## 2018-11-16 DIAGNOSIS — I890 Lymphedema, not elsewhere classified: Secondary | ICD-10-CM

## 2018-11-16 NOTE — Therapy (Signed)
[place] MAIN [facility] SERVICES [address] [place], [name], [address] Phone: [phone]   Fax:  [phone]  Occupational Therapy Treatment  Patient Details  Name: Sharon [name] MRN: [id number] Date of Birth: 07/20/1946 No data recorded  Encounter Date: 11/16/2018  OT End of Session - 11/16/18 1128    Visit Number  13    Number of Visits  36    Date for OT Re-Evaluation  11/29/18    Authorization Type  Visit 10 Progress Report completed 10/26/2018 for Reporting Period 08/31/2018 - 10/26/2018    OT Start Time  1000    OT Stop Time  1100    OT Time Calculation (min)  60 min    Activity Tolerance  Patient tolerated treatment well;No increased pain    Behavior During Therapy  WFL for tasks assessed/performed       Past Medical History:  Diagnosis Date  . Anxiety   . Arthritis    hands, upper back  . Asthma   . COPD (chronic obstructive pulmonary disease) ([place])   . History of cervical cancer   . Menopausal disorder   . Osteoporosis   . Pneumonia 1960  . Spasm of abdominal muscles of right side    intermittent  . TMJ (dislocation of temporomandibular joint)   . Wears dentures    partial lower    Past Surgical History:  Procedure Laterality Date  . ABDOMINAL HYSTERECTOMY  1970's  . bladder botox  2005  . BLADDER SUSPENSION  2004  . CATARACT EXTRACTION W/PHACO Right 03/09/2018   Procedure: CATARACT EXTRACTION PHACO AND INTRAOCULAR LENS PLACEMENT ([place]) right;  Surgeon: Eulogio Bear, MD;  Location: [place];  Service: Ophthalmology;  Laterality: Right;  CALL CELL 1ST  . CATARACT EXTRACTION W/PHACO Left 04/19/2018   Procedure: CATARACT EXTRACTION PHACO AND INTRAOCULAR LENS PLACEMENT (IOC)  LEFT;  Surgeon: Eulogio Bear, MD;  Location: [place];  Service: Ophthalmology;  Laterality: Left;  . COLONOSCOPY WITH PROPOFOL N/A 12/13/2015   Procedure: COLONOSCOPY WITH PROPOFOL;  Surgeon: Lucilla Lame, MD;  Location:  [place];  Service: Endoscopy;  Laterality: N/A;  . LOOP RECORDER INSERTION N/A 01/07/2018   Procedure: LOOP RECORDER INSERTION;  Surgeon: Deboraha Sprang, MD;  Location: [place] [facility];  Service: Cardiovascular;  Laterality: N/A;  . POLYPECTOMY N/A 12/13/2015   Procedure: POLYPECTOMY;  Surgeon: Lucilla Lame, MD;  Location: [place];  Service: Endoscopy;  Laterality: N/A;  SIGMOID COLON POLYPS X  5  . SHOULDER ARTHROSCOPY W/ ROTATOR CUFF REPAIR Right 1998  . TEE WITHOUT CARDIOVERSION N/A 01/06/2018   Procedure: TRANSESOPHAGEAL ECHOCARDIOGRAM (TEE);  Surgeon: Minna Merritts, MD;  Location: [facility];  Service: Cardiovascular;  Laterality: N/A;  . TONSILLECTOMY AND ADENOIDECTOMY      There were no vitals filed for this visit.  Subjective Assessment - 11/16/18 6712    Subjective   Mrs Millward presemnts for OT Rx visit 13/ 36 to address BLE LE. Pt brings clean wraps to clinic. "My stock8ings haven't arrived yet."    Patient is accompained by:  Family member    Pertinent History  Hx anxiety, COPD, OA, Asthma, hx cervical ca, osteoporosis, hx pneumonia, hx CVA, hx LLE cellulitis, hx L leg wound, hx falls    Limitations  chronic BLE leg swelling and associated pain, decreased balance, fall risk, decreased standing, walking and sitting tolerance, difficulty w/ lower body bathing and dressing, difficulty fitting LB clothing and shoes, unable  to drive, impaired transfers and functional mobility, impaired social participation, impaired ability to perform household chores, cooking, meal prep    Repetition  Increases Symptoms    Special Tests  Dopler negative for DVT    Patient Stated Goals  reduce leg pain and swelling so I can do more    Currently in Pain?  Yes    Pain Score  --   leg pain not rated numerically   Pain Location  Leg    Pain Orientation  Right;Left    Pain Descriptors / Indicators  Heaviness;Tiring;Tightness;Discomfort;Pressure;Sore;Tender    Pain Type  Chronic  pain                   OT Treatments/Exercises (OP) - 11/16/18 0001      ADLs   ADL Education Given  Yes      Manual Therapy   Manual Therapy  Edema management;Manual Lymphatic Drainage (MLD);Compression Bandaging    Edema Management  skin care to BLE with low ph [name] ( skin grade castor oil) throughuot MLD to increase skin hydration and mobilit y    Manual Lymphatic Drainage (MLD)  To reduce limb size, limit infection risk and limit progression of chronic , progressive LE, MLD to BLE using functional L inguinal LN. Omitted lateral neck portion of "short neck sequence" due to hx of stroke. Modified neck sequence included clavicular strokes only. Pt activated deep abdominal lymphatics ising diaphragmatic breathing after skiilled instruction. Tulized inguinal LN and worked in proximal to distal sequence  from thigh to toes. Completed MLD with 3 full sequemces  from distal to proximal pathways in rhwetrograde fashion. Good tolerance.    Compression Bandaging  RLE knee length compression wraps as estab;ished             OT Education - 11/16/18 1126    Education Details  Pt edu regarding differences in sequential pneumatic compression devices ("pumps") including recommended proximal to distal configuration which duplicates MLD sequeces for lymphatic disorder. Played vieo for Pt while performing BLE MLD    Person(s) Educated  Patient;Spouse    Methods  Explanation;Demonstration;Handout;Tactile cues;Verbal cues    Comprehension  Verbalized understanding;Returned demonstration;Need further instruction;Verbal cues required;Tactile cues required          OT Long Term Goals - 10/26/18 1427      OT LONG TERM GOAL #1   Title  Pt will demonstrate understanding of lymphedema (LE) precautions / prevention principals, including signs / symptoms of cellulitis infection with modified independence using LE Workbook as printed reference to identify 6 precautions without verbal  cues by end of 3rd  OT Rx visit.     Baseline  Max A    Time  3    Period  Days    Status  Achieved    Target Date  --   see imnitial evel dated 08/31/18 for target dates     OT LONG TERM GOAL #2   Title  Pt will be able to apply knee-length, multi-layer, short stretch compression wraps using correct gradient techniques with moderate  caregiver assistance to achieve optimal limb volume reduction during Intensive Phase CDT, and to return affected limb(s) as closely as possible, to premorbid size and shape.    Baseline  Max A    Time  2    Period  Weeks    Status  Achieved    Target Date  --   ee imnitial evel dated 08/31/18 for target dates  OT LONG TERM GOAL #3   Title  Pt to achieve at least  10% limb volume reduction in affected limb(s)  bilaterally during Intensive Phase CDT to control limb swelling, to improve tissue integrity and immune function, to improve ADLs performance and to improve functional mobility/ transfer, and to improve body image and self-esteem.    Baseline  dependent 09/23/2018 : Met for leg and thigh with 13.24% and 14.02% reductions. Full leg volume goal nearly achieved with 9.03% volume reduction.    Time  12    Period  Weeks    Status  Partially Met    Target Date  --   ee imnitial evel dated 08/31/18 for target dates     OT LONG TERM GOAL #4   Title  Pt will achieve 100% compliance with daily LE self-care home program components, including daily  skin care, simple self-MLD, gradient compression wraps/ compression garments/devices, and therapeutic exercise with Mod CG support to ensure optimal Intensive Phase limb volume reduction to expedite compression garment/ device fitting.    Baseline  dependent  10/26/2018: Pt ~ 50% compliant with 23/7 knee length compression wraps during clinical visit intervals. Pt using existing OTS compression knee highs on BLE  ( non clinical grade compression) when not wrapped    Time  12    Period  Weeks    Status  Partially  Met    Target Date  --   See imnitial evel dated 08/31/18 for target dates     OT LONG TERM GOAL #5   Title  Pt will use appropriate  assistive devices during LE self-care training to don compression garments/devices with modified independence to ensure optimal LE self-management over time and to limit progression of chronic LE.    Baseline  Max A    Time  12    Period  Weeks    Status  On-going    Target Date  --   See imnitial evel dated 08/31/18 for target dates     OT LONG TERM GOAL #6   Title  Pt will retain optimal limb volume reductions achieved during Intensive Phase CDT with no more than 3% volume increase with ongoing CG assistance to limit LE progression and further functional decline.    Baseline  Max A    Time  6    Period  Months    Status  On-going    Target Date  --   See imnitial evel dated 08/31/18 for target dates           Plan - 11/16/18 1128    Clinical Impression Statement  RLE mildly more swollen than LLE below knee. RLE volume is visibly reduced by end of session haver ~ 30 min MLD in supine. Pt stated  she has better understanding of compression device options and recommendations  for  Flexitouch device, which mimic MLD. Trial  with basic  , 8 chamber [employer] device set uop with manufacturer's rep during upcoming visit 2/2 Pt's insurance will not cover  Flexitouch at present./    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   Rehab Potential  Good    OT Frequency  3x / week    OT Duration  12 weeks    OT Treatment/Interventions  Self-care/ADL training;Therapeutic exercise;DME and/or AE instruction;Therapist, nutritional;Compression bandaging;Balance training;Other (comment);Manual Therapy;Coping strategies training;Patient/family education;Therapeutic activities;Manual lymph drainage;Energy  conservation  skin care with low ph lotion   Clinical Decision Making  Several treatment options, min-mod task modification necessary    OT Home Exercise Plan  lymphatic pumping ther ex    Recommended Other Services  fit w/ ccl 2 knee length compression stockings and convoluted HOS devices to decrease fibrosis    Consulted and Agree with Plan of Care  Patient;Family member/caregiver    Family Member Consulted  spouse       Patient will benefit from skilled therapeutic intervention in order to improve the following deficits and impairments:  Abnormal gait, Decreased balance, Decreased mobility, Decreased skin integrity, Difficulty walking, Decreased range of motion, Increased edema, Pain, Decreased activity tolerance, Decreased knowledge of use of DME, Impaired flexibility  Visit Diagnosis: Lymphedema, not elsewhere classified    Problem List Patient Active Problem List   Diagnosis Date Noted  . Senile purpura ([place]) 07/28/2018  . Chronic venous insufficiency 07/14/2018  . Lymphedema 07/14/2018  . Aneurysm of descending thoracic aorta (HCC) 05/04/2018  . Allergic rhinitis 02/16/2018  . Aortic atherosclerosis ([place]) 01/13/2018  . Status post placement of implantable loop recorder 01/12/2018  . Left-sided weakness 01/12/2018  . Chronic kidney disease, stage 3 ([place]) 01/12/2018  . Cerebrovascular accident (CVA) due to embolism of precerebral artery ([place])   . TIA (transient ischemic attack) 01/04/2018  . Osteoarthritis of wrist 12/09/2017  . Piriformis syndrome, left 11/25/2017  . CAD (coronary artery disease) 11/25/2017  . Caregiver stress 11/25/2017  . Menopause 08/14/2017  . Abnormal weight gain 08/14/2017  . De Quervain's tenosynovitis, right 07/17/2017  . Stress incontinence 06/19/2017  . Personal history of tobacco use, presenting hazards to health 04/01/2017  . Anxiety 03/13/2017  . Glossitis 11/14/2016  . Advanced care planning/counseling discussion 10/17/2016  . Other  fecal abnormalities   . Benign neoplasm of sigmoid colon   . COPD (chronic obstructive pulmonary disease) ([place]) 10/17/2015  . Mass of upper inner quadrant of right breast 06/22/2015    Andrey Spearman, MS, OTR/L, [facility] 11/16/18 11:33 AM  [place] MAIN [facility] SERVICES [address] [place], [name], [address] Phone: [phone]   Fax:  [phone]  Name: YLIANA GRAVOIS MRN: [id number] Date of Birth: 03-Sep-1946

## 2018-11-18 ENCOUNTER — Ambulatory Visit (INDEPENDENT_AMBULATORY_CARE_PROVIDER_SITE_OTHER): Payer: Medicare Other

## 2018-11-18 ENCOUNTER — Ambulatory Visit: Payer: Medicare Other | Admitting: Occupational Therapy

## 2018-11-18 DIAGNOSIS — K140 Glossitis: Secondary | ICD-10-CM

## 2018-11-18 DIAGNOSIS — I890 Lymphedema, not elsewhere classified: Secondary | ICD-10-CM | POA: Diagnosis not present

## 2018-11-18 DIAGNOSIS — E538 Deficiency of other specified B group vitamins: Secondary | ICD-10-CM | POA: Diagnosis not present

## 2018-11-18 NOTE — Therapy (Signed)
[place] MAIN [facility] SERVICES [address] [place], [name], [address] Phone: [phone]   Fax:  [phone]  Occupational Therapy Treatment  Patient Details  Name: Sharon [name] MRN: [id number] Date of Birth: [birth year] No data recorded  Encounter Date: 11/18/2018  OT End of Session - 11/18/18 1654    Visit Number  14    Number of Visits  64    Date for OT Re-Evaluation  11/29/18    Authorization Type  Visit 10 Progress Report completed 10/26/2018 for Reporting Period 08/31/2018 - 10/26/2018    OT Start Time  1015    OT Stop Time  1115    OT Time Calculation (min)  60 min    Activity Tolerance  Patient tolerated treatment well;No increased pain    Behavior During Therapy  WFL for tasks assessed/performed       Past Medical History:  Diagnosis Date  . Anxiety   . Arthritis    hands, upper back  . Asthma   . COPD (chronic obstructive pulmonary disease) ([place])   . History of cervical cancer   . Menopausal disorder   . Osteoporosis   . Pneumonia 1960  . Spasm of abdominal muscles of right side    intermittent  . TMJ (dislocation of temporomandibular joint)   . Wears dentures    partial lower    Past Surgical History:  Procedure Laterality Date  . ABDOMINAL HYSTERECTOMY  1970's  . bladder botox  2005  . BLADDER SUSPENSION  2004  . CATARACT EXTRACTION W/PHACO Right 03/09/2018   Procedure: CATARACT EXTRACTION PHACO AND INTRAOCULAR LENS PLACEMENT ([place]) right;  Surgeon: Eulogio Bear, MD;  Location: [place];  Service: Ophthalmology;  Laterality: Right;  CALL CELL 1ST  . CATARACT EXTRACTION W/PHACO Left 04/19/2018   Procedure: CATARACT EXTRACTION PHACO AND INTRAOCULAR LENS PLACEMENT (IOC)  LEFT;  Surgeon: Eulogio Bear, MD;  Location: [place];  Service: Ophthalmology;  Laterality: Left;  . COLONOSCOPY WITH PROPOFOL N/A 12/13/2015   Procedure: COLONOSCOPY WITH PROPOFOL;  Surgeon: Lucilla Lame, MD;  Location:  [place];  Service: Endoscopy;  Laterality: N/A;  . LOOP RECORDER INSERTION N/A 01/07/2018   Procedure: LOOP RECORDER INSERTION;  Surgeon: Deboraha Sprang, MD;  Location: [place] [facility];  Service: Cardiovascular;  Laterality: N/A;  . POLYPECTOMY N/A 12/13/2015   Procedure: POLYPECTOMY;  Surgeon: Lucilla Lame, MD;  Location: [place];  Service: Endoscopy;  Laterality: N/A;  SIGMOID COLON POLYPS X  5  . SHOULDER ARTHROSCOPY W/ ROTATOR CUFF REPAIR Right 1998  . TEE WITHOUT CARDIOVERSION N/A 01/06/2018   Procedure: TRANSESOPHAGEAL ECHOCARDIOGRAM (TEE);  Surgeon: Minna Merritts, MD;  Location: [facility];  Service: Cardiovascular;  Laterality: N/A;  . TONSILLECTOMY AND ADENOIDECTOMY      There were no vitals filed for this visit.  Subjective Assessment - 11/18/18 1040    Subjective   Sharon [name] presents for OT Rx visit 14/ 36 to address BLE LE. Pt brings recommended compression garments to clinic for fitting, Manufacturer's rep from [employer] is here to assist Pt with trial of Basic Pump.    Patient is accompained by:  Family member    Pertinent History  Hx anxiety, COPD, OA, Asthma, hx cervical ca, osteoporosis, hx pneumonia, hx CVA, hx LLE cellulitis, hx L leg wound, hx falls    Limitations  chronic BLE leg swelling and associated pain, decreased balance, fall risk, decreased standing, walking and sitting tolerance, difficulty  w/ lower body bathing and dressing, difficulty fitting LB clothing and shoes, unable to drive, impaired transfers and functional mobility, impaired social participation, impaired ability to perform household chores, cooking, meal prep    Repetition  Increases Symptoms    Special Tests  Dopler negative for DVT    Patient Stated Goals  reduce leg pain and swelling so I can do more    Currently in Pain?  Yes    Pain Score  3     Pain Location  Toe (Comment which one)    Pain Orientation  Left    Pain Descriptors / Indicators   Tender;Tightness;Tingling;Numbness    Pain Type  Chronic pain;Neuropathic pain                   OT Treatments/Exercises (OP) - 11/18/18 0001      ADLs   ADL Education Given  Yes      Manual Therapy   Manual Therapy  Edema management;Manual Lymphatic Drainage (MLD);Compression Bandaging    Compression Bandaging  Fitted OTS thigh high compression garments bilaterally             OT Education - 11/18/18 1652    Education Details  Pt and spouse edu throughout trial of basic, 8 chambered sequentiao pneumatic compression pump ( distal to proximal) re pros and cons of device for managing lymphedema. Discussed precautions and Pt instructed to remove device immediately if she experiences acute  pain in any part of the LE and/ or unusual SOB, such as at rest.  Pt instructed on properr care and use of device on alternating legs 1 x daily only.  Pt and spouse instructed on donning and doffing , wear and care of compression garments.    Person(s) Educated  Patient;Spouse    Methods  Explanation;Demonstration;Handout;Tactile cues;Verbal cues    Comprehension  Verbalized understanding;Returned demonstration;Need further instruction;Verbal cues required;Tactile cues required          OT Long Term Goals - 10/26/18 1427      OT LONG TERM GOAL #1   Title  Pt will demonstrate understanding of lymphedema (LE) precautions / prevention principals, including signs / symptoms of cellulitis infection with modified independence using LE Workbook as printed reference to identify 6 precautions without verbal cues by end of 3rd  OT Rx visit.     Baseline  Max A    Time  3    Period  Days    Status  Achieved    Target Date  --   see imnitial evel dated 08/31/18 for target dates     OT LONG TERM GOAL #2   Title  Pt will be able to apply knee-length, multi-layer, short stretch compression wraps using correct gradient techniques with moderate  caregiver assistance to achieve optimal limb  volume reduction during Intensive Phase CDT, and to return affected limb(s) as closely as possible, to premorbid size and shape.    Baseline  Max A    Time  2    Period  Weeks    Status  Achieved    Target Date  --   ee imnitial evel dated 08/31/18 for target dates     OT LONG TERM GOAL #3   Title  Pt to achieve at least  10% limb volume reduction in affected limb(s)  bilaterally during Intensive Phase CDT to control limb swelling, to improve tissue integrity and immune function, to improve ADLs performance and to improve functional mobility/ transfer, and to improve body  image and self-esteem.    Baseline  dependent 09/23/2018 : Met for leg and thigh with 13.24% and 14.02% reductions. Full leg volume goal nearly achieved with 9.03% volume reduction.    Time  12    Period  Weeks    Status  Partially Met    Target Date  --   ee imnitial evel dated 08/31/18 for target dates     OT LONG TERM GOAL #4   Title  Pt will achieve 100% compliance with daily LE self-care home program components, including daily  skin care, simple self-MLD, gradient compression wraps/ compression garments/devices, and therapeutic exercise with Mod CG support to ensure optimal Intensive Phase limb volume reduction to expedite compression garment/ device fitting.    Baseline  dependent  10/26/2018: Pt ~ 50% compliant with 23/7 knee length compression wraps during clinical visit intervals. Pt using existing OTS compression knee highs on BLE  ( non clinical grade compression) when not wrapped    Time  12    Period  Weeks    Status  Partially Met    Target Date  --   See imnitial evel dated 08/31/18 for target dates     OT LONG TERM GOAL #5   Title  Pt will use appropriate  assistive devices during LE self-care training to don compression garments/devices with modified independence to ensure optimal LE self-management over time and to limit progression of chronic LE.    Baseline  Max A    Time  12    Period  Weeks     Status  On-going    Target Date  --   See imnitial evel dated 08/31/18 for target dates     OT LONG TERM GOAL #6   Title  Pt will retain optimal limb volume reductions achieved during Intensive Phase CDT with no more than 3% volume increase with ongoing CG assistance to limit LE progression and further functional decline.    Baseline  Max A    Time  6    Period  Months    Status  On-going    Target Date  --   See imnitial evel dated 08/31/18 for target dates           Plan - 11/18/18 1656    Clinical Impression Statement  Pt tolerated basic cthigh lengtgh, 8 chamber compression pump on LLE without difficulty. She preferred 15-20 mmHg cmopression . Positive indentations noted in skin when trial complete. Pt stated if reduced pain and heaviness in her laeg. AShe was able to don and doff pump garment independently after demo and verbal   cues. After skilled edu she was able to don  ccl 2 ( 30-40 mmHg) Juzo Soft (2002) thigh lengtgh compression thigh highs w/ open toes and SB at top using assitive devices ( friction glove and Tyvek sock). Will assess   garment function next week . If edema and sensory symptoms are well controlled will decrease OT to 1 x weekly as Pt transitions to self-care / management phase of CDT.    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   Rehab Potential  Good    OT Frequency  3x / week    OT Duration  12 weeks    OT Treatment/Interventions  Self-care/ADL training;Therapeutic exercise;DME and/or AE instruction;Therapist, nutritional;Compression bandaging;Balance training;Other (comment);Manual Therapy;Coping strategies training;Patient/family education;Therapeutic activities;Manual lymph drainage;Energy conservation   skin  care with low ph lotion   Clinical Decision Making  Several treatment options,  min-mod task modification necessary    OT Home Exercise Plan  lymphatic pumping ther ex    Recommended Other Services  fit w/ ccl 2 knee length compression stockings and convoluted HOS devices to decrease fibrosis    Consulted and Agree with Plan of Care  Patient;Family member/caregiver    Family Member Consulted  spouse       Patient will benefit from skilled therapeutic intervention in order to improve the following deficits and impairments:  Abnormal gait, Decreased balance, Decreased mobility, Decreased skin integrity, Difficulty walking, Decreased range of motion, Increased edema, Pain, Decreased activity tolerance, Decreased knowledge of use of DME, Impaired flexibility  Visit Diagnosis: Lymphedema, not elsewhere classified    Problem List Patient Active Problem List   Diagnosis Date Noted  . Senile purpura ([place]) 07/28/2018  . Chronic venous insufficiency 07/14/2018  . Lymphedema 07/14/2018  . Aneurysm of descending thoracic aorta (HCC) 05/04/2018  . Allergic rhinitis 02/16/2018  . Aortic atherosclerosis ([place]) 01/13/2018  . Status post placement of implantable loop recorder 01/12/2018  . Left-sided weakness 01/12/2018  . Chronic kidney disease, stage 3 ([place]) 01/12/2018  . Cerebrovascular accident (CVA) due to embolism of precerebral artery ([place])   . TIA (transient ischemic attack) 01/04/2018  . Osteoarthritis of wrist 12/09/2017  . Piriformis syndrome, left 11/25/2017  . CAD (coronary artery disease) 11/25/2017  . Caregiver stress 11/25/2017  . Menopause 08/14/2017  . Abnormal weight gain 08/14/2017  . De Quervain's tenosynovitis, right 07/17/2017  . Stress incontinence 06/19/2017  . Personal history of tobacco use, presenting hazards to health 04/01/2017  . Anxiety 03/13/2017  . Glossitis 11/14/2016  . Advanced care planning/counseling discussion 10/17/2016  . Other fecal abnormalities   . Benign neoplasm of sigmoid colon   . COPD (chronic obstructive pulmonary  disease) ([place]) 10/17/2015  . Mass of upper inner quadrant of right breast 06/22/2015    Andrey Spearman, MS, OTR/L, [facility] 11/18/18 5:00 PM   [place] MAIN [facility] SERVICES [address] [place], [name], [address] Phone: [phone]   Fax:  [phone]  Name: Sharon [name] MRN: [id number] Date of Birth: 06-29-1946

## 2018-11-23 ENCOUNTER — Ambulatory Visit: Payer: Medicare Other | Admitting: Occupational Therapy

## 2018-11-25 ENCOUNTER — Ambulatory Visit: Payer: Medicare Other | Admitting: Occupational Therapy

## 2018-11-25 ENCOUNTER — Encounter: Payer: Self-pay | Admitting: Occupational Therapy

## 2018-11-25 DIAGNOSIS — I890 Lymphedema, not elsewhere classified: Secondary | ICD-10-CM | POA: Diagnosis not present

## 2018-11-25 NOTE — Therapy (Signed)
[place] MAIN [facility] SERVICES [address] [place], [name], [address] Phone: [phone]   Fax:  [phone]  Occupational Therapy Treatment  Patient Details  Name: Sharon [name] MRN: [id number] Date of Birth: [birth year] No data recorded  Encounter Date: 11/25/2018  OT End of Session - 11/25/18 1523    Visit Number  15    Number of Visits  24    Date for OT Re-Evaluation  11/29/18    Authorization Type  Visit 10 Progress Report completed 10/26/2018 for Reporting Period 08/31/2018 - 10/26/2018    OT Start Time  1010    OT Stop Time  1110    OT Time Calculation (min)  60 min    Activity Tolerance  Patient tolerated treatment well;No increased pain    Behavior During Therapy  WFL for tasks assessed/performed       Past Medical History:  Diagnosis Date  . Anxiety   . Arthritis    hands, upper back  . Asthma   . COPD (chronic obstructive pulmonary disease) ([place])   . History of cervical cancer   . Menopausal disorder   . Osteoporosis   . Pneumonia 1960  . Spasm of abdominal muscles of right side    intermittent  . TMJ (dislocation of temporomandibular joint)   . Wears dentures    partial lower    Past Surgical History:  Procedure Laterality Date  . ABDOMINAL HYSTERECTOMY  1970's  . bladder botox  2005  . BLADDER SUSPENSION  2004  . CATARACT EXTRACTION W/PHACO Right 03/09/2018   Procedure: CATARACT EXTRACTION PHACO AND INTRAOCULAR LENS PLACEMENT ([place]) right;  Surgeon: Eulogio Bear, MD;  Location: [place];  Service: Ophthalmology;  Laterality: Right;  CALL CELL 1ST  . CATARACT EXTRACTION W/PHACO Left 04/19/2018   Procedure: CATARACT EXTRACTION PHACO AND INTRAOCULAR LENS PLACEMENT (IOC)  LEFT;  Surgeon: Eulogio Bear, MD;  Location: [place];  Service: Ophthalmology;  Laterality: Left;  . COLONOSCOPY WITH PROPOFOL N/A 12/13/2015   Procedure: COLONOSCOPY WITH PROPOFOL;  Surgeon: Lucilla Lame, MD;  Location:  [place];  Service: Endoscopy;  Laterality: N/A;  . LOOP RECORDER INSERTION N/A 01/07/2018   Procedure: LOOP RECORDER INSERTION;  Surgeon: Deboraha Sprang, MD;  Location: [place] [facility];  Service: Cardiovascular;  Laterality: N/A;  . POLYPECTOMY N/A 12/13/2015   Procedure: POLYPECTOMY;  Surgeon: Lucilla Lame, MD;  Location: [place];  Service: Endoscopy;  Laterality: N/A;  SIGMOID COLON POLYPS X  5  . SHOULDER ARTHROSCOPY W/ ROTATOR CUFF REPAIR Right 1998  . TEE WITHOUT CARDIOVERSION N/A 01/06/2018   Procedure: TRANSESOPHAGEAL ECHOCARDIOGRAM (TEE);  Surgeon: Minna Merritts, MD;  Location: [facility];  Service: Cardiovascular;  Laterality: N/A;  . TONSILLECTOMY AND ADENOIDECTOMY      There were no vitals filed for this visit.  Subjective Assessment - 11/25/18 1515    Subjective   Mrs Wooton presents for OT Rx visit 15/ 36 to address BLE LE. Pt presents without thigh length compression in place. Instead she wears non-medical grade compression to clinic. Pt and OT hadiscussed  progress towards goals and POC going forward throughouut visit.    Patient is accompained by:  Family member    Pertinent History  Hx anxiety, COPD, OA, Asthma, hx cervical ca, osteoporosis, hx pneumonia, hx CVA, hx LLE cellulitis, hx L leg wound, hx falls    Limitations  chronic BLE leg swelling and associated pain, decreased balance, fall risk, decreased  standing, walking and sitting tolerance, difficulty w/ lower body bathing and dressing, difficulty fitting LB clothing and shoes, unable to drive, impaired transfers and functional mobility, impaired social participation, impaired ability to perform household chores, cooking, meal prep    Repetition  Increases Symptoms    Special Tests  Dopler negative for DVT    Patient Stated Goals  reduce leg pain and swelling so I can do more    Currently in Pain?  No/denies   no leg pain this morning                  OT Treatments/Exercises (OP)  - 11/25/18 0001      ADLs   ADL Education Given  Yes      Manual Therapy   Manual Therapy  Edema management;Manual Lymphatic Drainage (MLD)    Manual Lymphatic Drainage (MLD)  MLD to BLE as established using functional LN    Compression Bandaging  Max A to don BLE compression thigh highs 2/2 time constraints. Pt is able to don garments with modified independence using assistive devices and extra time             OT Education - 11/25/18 1519    Education Details  Cont Pt edu re transitin toself Managemnent Phase of CDT. Cont ADL training for donning and doffing assistive devices.    Person(s) Educated  Patient;Spouse    Methods  Explanation;Demonstration;Handout;Tactile cues;Verbal cues    Comprehension  Verbalized understanding;Returned demonstration;Need further instruction;Verbal cues required;Tactile cues required          OT Long Term Goals - 11/25/18 1021      OT LONG TERM GOAL #1   Title  Pt will demonstrate understanding of lymphedema (LE) precautions / prevention principals, including signs / symptoms of cellulitis infection with modified independence using LE Workbook as printed reference to identify 6 precautions without verbal cues by end of 3rd  OT Rx visit.   (Pended)     Baseline  Max A  (Pended)     Time  3  (Pended)     Period  Days  (Pended)     Status  Achieved  (Pended)       OT LONG TERM GOAL #2   Title  Pt will be able to apply knee-length, multi-layer, short stretch compression wraps using correct gradient techniques with moderate  caregiver assistance to achieve optimal limb volume reduction during Intensive Phase CDT, and to return affected limb(s) as closely as possible, to premorbid size and shape.  (Pended)     Baseline  Max A  (Pended)     Time  2  (Pended)     Period  Weeks  (Pended)     Status  Achieved  (Pended)       OT LONG TERM GOAL #3   Title  Pt to achieve at least  10% limb volume reduction in affected limb(s)  bilaterally during  Intensive Phase CDT to control limb swelling, to improve tissue integrity and immune function, to improve ADLs performance and to improve functional mobility/ transfer, and to improve body image and self-esteem.  (Pended)     Baseline  dependent 09/23/2018 : Met for leg and thigh with 13.24% and 14.02% reductions. Full leg volume goal nearly achieved with 9.03% volume reduction.  (Pended)     Time  12  (Pended)     Period  Weeks  (Pended)     Status  Partially Met  (Pended)  OT LONG TERM GOAL #4   Title  Pt will achieve 100% compliance with daily LE self-care home program components, including daily  skin care, simple self-MLD, gradient compression wraps/ compression garments/devices, and therapeutic exercise with Mod CG support to ensure optimal Intensive Phase limb volume reduction to expedite compression garment/ device fitting.  (Pended)     Baseline  dependent  10/26/2018: Pt ~ 50% compliant with 23/7 knee length compression wraps during clinical visit intervals. Pt using existing OTS compression knee highs on BLE  ( non clinical grade compression) when not wrapped  (Pended)     Time  12  (Pended)     Period  Weeks  (Pended)     Status  Achieved  (Pended)       OT LONG TERM GOAL #5   Title  Pt will use appropriate  assistive devices during LE self-care training to don compression garments/devices with modified independence to ensure optimal LE self-management over time and to limit progression of chronic LE.  (Pended)     Baseline  Max A  (Pended)     Time  12  (Pended)     Period  Weeks  (Pended)     Status  On-going  (Pended)       OT LONG TERM GOAL #6   Title  Pt will retain optimal limb volume reductions achieved during Intensive Phase CDT with no more than 3% volume increase with ongoing CG assistance to limit LE progression and further functional decline.  (Pended)     Baseline  Max A  (Pended)     Time  6  (Pended)     Period  Months  (Pended)     Status  On-going  (Pended)              Plan - 11/25/18 1524    Clinical Impression Statement  Reviewed progress towards each OT goal for LE self care and management. Reviewed progress and plan to transition to management phase of CDT with all goals met or ongoing.. Pt understands all self care regimes and is ~ 85% compliant with home program todaye. Pt edu re follow along care plan. She verbalized understanding that skilled care is indicated when medical necessity is identified in eval;uation. Pt understands that she is entyering Management phase of CDT and she is equipped to take on self care very soon having completed Intensive CDT successfully.    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   Rehab Potential  Good    OT Frequency  3x / week    OT Duration  12 weeks    OT Treatment/Interventions  Self-care/ADL training;Therapeutic exercise;DME and/or AE instruction;Therapist, nutritional;Compression bandaging;Balance training;Other (comment);Manual Therapy;Coping strategies training;Patient/family education;Therapeutic activities;Manual lymph drainage;Energy conservation   skin care with low ph lotion   Clinical Decision Making  Several treatment options, min-mod task modification necessary    OT Home Exercise Plan  lymphatic pumping ther ex    Recommended Other Services  fit w/ ccl 2 knee length compression stockings and convoluted HOS devices to decrease fibrosis    Consulted and Agree with Plan of Care  Patient;Family member/caregiver    Family Member Consulted  spouse       Patient will benefit from skilled therapeutic intervention in order to improve the following deficits and impairments:  Abnormal gait, Decreased balance, Decreased mobility, Decreased skin integrity, Difficulty walking, Decreased range  of motion, Increased edema, Pain, Decreased  activity tolerance, Decreased knowledge of use of DME, Impaired flexibility  Visit Diagnosis: Lymphedema, not elsewhere classified    Problem List Patient Active Problem List   Diagnosis Date Noted  . Senile purpura ([place]) 07/28/2018  . Chronic venous insufficiency 07/14/2018  . Lymphedema 07/14/2018  . Aneurysm of descending thoracic aorta (HCC) 05/04/2018  . Allergic rhinitis 02/16/2018  . Aortic atherosclerosis ([place]) 01/13/2018  . Status post placement of implantable loop recorder 01/12/2018  . Left-sided weakness 01/12/2018  . Chronic kidney disease, stage 3 ([place]) 01/12/2018  . Cerebrovascular accident (CVA) due to embolism of precerebral artery ([place])   . TIA (transient ischemic attack) 01/04/2018  . Osteoarthritis of wrist 12/09/2017  . Piriformis syndrome, left 11/25/2017  . CAD (coronary artery disease) 11/25/2017  . Caregiver stress 11/25/2017  . Menopause 08/14/2017  . Abnormal weight gain 08/14/2017  . De Quervain's tenosynovitis, right 07/17/2017  . Stress incontinence 06/19/2017  . Personal history of tobacco use, presenting hazards to health 04/01/2017  . Anxiety 03/13/2017  . Glossitis 11/14/2016  . Advanced care planning/counseling discussion 10/17/2016  . Other fecal abnormalities   . Benign neoplasm of sigmoid colon   . COPD (chronic obstructive pulmonary disease) ([place]) 10/17/2015  . Mass of upper inner quadrant of right breast 06/22/2015    Andrey Spearman, MS, OTR/L, [facility] 11/25/18 3:25 PM  [place] MAIN [facility] SERVICES [address] [place], [name], [address] Phone: [phone]   Fax:  [phone]  Name: AYLAH YEARY MRN: [id number] Date of Birth: [birth year]

## 2018-11-26 ENCOUNTER — Telehealth: Payer: Self-pay

## 2018-11-26 ENCOUNTER — Telehealth: Payer: Self-pay | Admitting: Nurse Practitioner

## 2018-11-26 NOTE — Telephone Encounter (Signed)
Spoke with spouse regarding disconnected monitor.

## 2018-11-26 NOTE — Telephone Encounter (Signed)
Copied from [place]. Topic: Quick Communication - See Telephone Encounter >> Nov 26, 2018  3:34 PM Sheran Luz wrote: CRM for notification. See Telephone encounter for: 11/26/18.   Joe with [facility] calling on behalf of patient stating that prior auth is needed for the medication albuterol (PROAIR HFA) 108 (90 Base) MCG/ACT inhaler. Joe states he was advised by patient that this was requested by patient in [place] and have not heard anything back regarding this.   Provider line for prior auth: [phone]

## 2018-11-26 NOTE — Telephone Encounter (Signed)
Can we send prior auth for this patient's Albuterol?  Thank you.  She uses this as a rescue inhaler for her COPD.  If they do not approve, can we find out alternatives.

## 2018-11-29 NOTE — Telephone Encounter (Addendum)
Preferred medications are Proair HFA or Proair Respiclick. Spoke to Dr. [name], DNP and okay to change to brand name. Pharmacy was called and VM left to switch medication to preferred medication. Will need to fail brand name before they will cover generic.

## 2018-11-30 ENCOUNTER — Ambulatory Visit (INDEPENDENT_AMBULATORY_CARE_PROVIDER_SITE_OTHER): Payer: Medicare Other

## 2018-11-30 ENCOUNTER — Ambulatory Visit: Payer: Medicare Other | Admitting: Occupational Therapy

## 2018-11-30 DIAGNOSIS — I639 Cerebral infarction, unspecified: Secondary | ICD-10-CM | POA: Diagnosis not present

## 2018-11-30 DIAGNOSIS — I890 Lymphedema, not elsewhere classified: Secondary | ICD-10-CM | POA: Diagnosis not present

## 2018-11-30 NOTE — Therapy (Signed)
[place] MAIN [facility] SERVICES [address] [place], [name], [address] Phone: [phone]   Fax:  [phone]  Occupational Therapy Treatment  Patient Details  Name: REDITH DRACH MRN: [id number] Date of Birth: 08/18/1946 No data recorded  Encounter Date: 11/30/2018  OT End of Session - 11/30/18 1632    Visit Number  16    Number of Visits  36    Date for OT Re-Evaluation  02/28/19    OT Start Time  1005    OT Stop Time  1115    OT Time Calculation (min)  70 min    Activity Tolerance  Patient tolerated treatment well;No increased pain    Behavior During Therapy  WFL for tasks assessed/performed       Past Medical History:  Diagnosis Date  . Anxiety   . Arthritis    hands, upper back  . Asthma   . COPD (chronic obstructive pulmonary disease) ([place])   . History of cervical cancer   . Menopausal disorder   . Osteoporosis   . Pneumonia 1960  . Spasm of abdominal muscles of right side    intermittent  . TMJ (dislocation of temporomandibular joint)   . Wears dentures    partial lower    Past Surgical History:  Procedure Laterality Date  . ABDOMINAL HYSTERECTOMY  1970's  . bladder botox  2005  . BLADDER SUSPENSION  2004  . CATARACT EXTRACTION W/PHACO Right 03/09/2018   Procedure: CATARACT EXTRACTION PHACO AND INTRAOCULAR LENS PLACEMENT ([place]) right;  Surgeon: Eulogio Bear, MD;  Location: [place];  Service: Ophthalmology;  Laterality: Right;  CALL CELL 1ST  . CATARACT EXTRACTION W/PHACO Left 04/19/2018   Procedure: CATARACT EXTRACTION PHACO AND INTRAOCULAR LENS PLACEMENT (IOC)  LEFT;  Surgeon: Eulogio Bear, MD;  Location: [place];  Service: Ophthalmology;  Laterality: Left;  . COLONOSCOPY WITH PROPOFOL N/A 12/13/2015   Procedure: COLONOSCOPY WITH PROPOFOL;  Surgeon: Lucilla Lame, MD;  Location: [place];  Service: Endoscopy;  Laterality: N/A;  . LOOP RECORDER INSERTION N/A 01/07/2018   Procedure: LOOP RECORDER INSERTION;  Surgeon: Deboraha Sprang, MD;  Location: [place] [facility];  Service: Cardiovascular;  Laterality: N/A;  . POLYPECTOMY N/A 12/13/2015   Procedure: POLYPECTOMY;  Surgeon: Lucilla Lame, MD;  Location: [place];  Service: Endoscopy;  Laterality: N/A;  SIGMOID COLON POLYPS X  5  . SHOULDER ARTHROSCOPY W/ ROTATOR CUFF REPAIR Right 1998  . TEE WITHOUT CARDIOVERSION N/A 01/06/2018   Procedure: TRANSESOPHAGEAL ECHOCARDIOGRAM (TEE);  Surgeon: Minna Merritts, MD;  Location: [facility];  Service: Cardiovascular;  Laterality: N/A;  . TONSILLECTOMY AND ADENOIDECTOMY      There were no vitals filed for this visit.  Subjective Assessment - 11/30/18 1023    Subjective   Mrs Macht presents for OT Rx visit 16/ 36 to address BLE LE. Pt presents with thy length compression stockings in place. Pt c/o stockings feling down from top edge for past day or so.    Patient is accompained by:  Family member    Pertinent History  Hx anxiety, COPD, OA, Asthma, hx cervical ca, osteoporosis, hx pneumonia, hx CVA, hx LLE cellulitis, hx L leg wound, hx falls    Limitations  chronic BLE leg swelling and associated pain, decreased balance, fall risk, decreased standing, walking and sitting tolerance, difficulty w/ lower body bathing and dressing, difficulty fitting LB clothing and shoes, unable to drive, impaired transfers and functional mobility, impaired  social participation, impaired ability to perform household chores, cooking, meal prep    Repetition  Increases Symptoms    Special Tests  Dopler negative for DVT    Patient Stated Goals  reduce leg pain and swelling so I can do more    Currently in Pain?  Yes    Pain Score  3     Pain Location  Leg    Pain Descriptors / Indicators  Tender;Burning;Sore;Tightness   stinging   Pain Type  Chronic pain                   OT Treatments/Exercises (OP) - 11/30/18 0001      ADLs   ADL Education Given  Yes      Manual  Therapy   Manual Therapy  Edema management;Manual Lymphatic Drainage (MLD)    Manual Lymphatic Drainage (MLD)  MLD to LLE as established using functional LN    Compression Bandaging  Mod A to don BLE thigh highs             OT Education - 11/30/18 1631    Education Details  Emphasis of Pt edu on donning and doffing compression garments , and how to distribute fabric for optimal fit and function    Person(s) Educated  Patient;Spouse    Methods  Explanation;Demonstration;Handout;Tactile cues;Verbal cues    Comprehension  Verbalized understanding;Returned demonstration;Need further instruction;Verbal cues required;Tactile cues required          OT Long Term Goals - 11/30/18 1633      OT LONG TERM GOAL #1   Title  Pt will demonstrate understanding of lymphedema (LE) precautions / prevention principals, including signs / symptoms of cellulitis infection with modified independence using LE Workbook as printed reference to identify 6 precautions without verbal cues by end of 3rd  OT Rx visit.     Baseline  Max A    Time  3    Period  Days    Status  Achieved      OT LONG TERM GOAL #2   Title  Pt will be able to apply knee-length, multi-layer, short stretch compression wraps using correct gradient techniques with moderate  caregiver assistance to achieve optimal limb volume reduction during Intensive Phase CDT, and to return affected limb(s) as closely as possible, to premorbid size and shape.    Baseline  Max A    Time  2    Period  Weeks    Status  Achieved      OT LONG TERM GOAL #3   Title  Pt to achieve at least  10% limb volume reduction in affected limb(s)  bilaterally during Intensive Phase CDT to control limb swelling, to improve tissue integrity and immune function, to improve ADLs performance and to improve functional mobility/ transfer, and to improve body image and self-esteem.    Baseline  dependent 09/23/2018 : Met for leg and thigh with 13.24% and 14.02% reductions.  Full leg volume goal nearly achieved with 9.03% volume reduction.    Time  12    Period  Weeks    Status  Achieved      OT LONG TERM GOAL #4   Title  Pt will achieve 100% compliance with daily LE self-care home program components, including daily  skin care, simple self-MLD, gradient compression wraps/ compression garments/devices, and therapeutic exercise with Mod CG support to ensure optimal Intensive Phase limb volume reduction to expedite compression garment/ device fitting.    Baseline  dependent  10/26/2018: Pt ~ 50% compliant with 23/7 knee length compression wraps during clinical visit intervals. Pt using existing OTS compression knee highs on BLE  ( non clinical grade compression) when not wrapped    Time  12    Period  Weeks    Status  Achieved      OT LONG TERM GOAL #5   Title  Pt will use appropriate  assistive devices during LE self-care training to don compression garments/devices with modified independence to ensure optimal LE self-management over time and to limit progression of chronic LE.    Baseline  Max A    Time  12    Period  Weeks    Status  Achieved      OT LONG TERM GOAL #6   Title  Pt will retain optimal limb volume reductions achieved during Intensive Phase CDT with no more than 3% volume increase with ongoing CG assistance to limit LE progression and further functional decline.    Baseline  Max A    Time  6    Period  Months    Status  On-going              Patient will benefit from skilled therapeutic intervention in order to improve the following deficits and impairments:     Visit Diagnosis: Lymphedema, not elsewhere classified    Problem List Patient Active Problem List   Diagnosis Date Noted  . Senile purpura ([place]) 07/28/2018  . Chronic venous insufficiency 07/14/2018  . Lymphedema 07/14/2018  . Aneurysm of descending thoracic aorta (HCC) 05/04/2018  . Allergic rhinitis 02/16/2018  . Aortic atherosclerosis ([place]) 01/13/2018  . Status  post placement of implantable loop recorder 01/12/2018  . Left-sided weakness 01/12/2018  . Chronic kidney disease, stage 3 ([place]) 01/12/2018  . Cerebrovascular accident (CVA) due to embolism of precerebral artery ([place])   . TIA (transient ischemic attack) 01/04/2018  . Osteoarthritis of wrist 12/09/2017  . Piriformis syndrome, left 11/25/2017  . CAD (coronary artery disease) 11/25/2017  . Caregiver stress 11/25/2017  . Menopause 08/14/2017  . Abnormal weight gain 08/14/2017  . De Quervain's tenosynovitis, right 07/17/2017  . Stress incontinence 06/19/2017  . Personal history of tobacco use, presenting hazards to health 04/01/2017  . Anxiety 03/13/2017  . Glossitis 11/14/2016  . Advanced care planning/counseling discussion 10/17/2016  . Other fecal abnormalities   . Benign neoplasm of sigmoid colon   . COPD (chronic obstructive pulmonary disease) ([place]) 10/17/2015  . Mass of upper inner quadrant of right breast 06/22/2015    Andrey Spearman, MS, OTR/L, [facility] 11/30/18 4:49 PM  [place] MAIN [facility] SERVICES [address] [place], [name], [address] Phone: [phone]   Fax:  [phone]  Name: ZHANNA MELIN MRN: [id number] Date of Birth: Jul 22, 1946

## 2018-12-01 LAB — CUP PACEART REMOTE DEVICE CHECK
Date Time Interrogation Session: 20200218170925
Implantable Pulse Generator Implant Date: 20190328

## 2018-12-02 ENCOUNTER — Ambulatory Visit: Payer: Medicare Other | Admitting: Occupational Therapy

## 2018-12-05 ENCOUNTER — Encounter: Payer: Self-pay | Admitting: Nurse Practitioner

## 2018-12-05 DIAGNOSIS — E538 Deficiency of other specified B group vitamins: Secondary | ICD-10-CM | POA: Insufficient documentation

## 2018-12-07 ENCOUNTER — Ambulatory Visit: Payer: Medicare Other | Admitting: Occupational Therapy

## 2018-12-07 ENCOUNTER — Other Ambulatory Visit: Payer: Self-pay

## 2018-12-07 ENCOUNTER — Encounter: Payer: Self-pay | Admitting: Nurse Practitioner

## 2018-12-07 ENCOUNTER — Ambulatory Visit (INDEPENDENT_AMBULATORY_CARE_PROVIDER_SITE_OTHER): Payer: Medicare Other | Admitting: Nurse Practitioner

## 2018-12-07 VITALS — BP 133/76 | HR 79 | Temp 98.0°F | Ht 68.9 in | Wt 214.4 lb

## 2018-12-07 DIAGNOSIS — Z636 Dependent relative needing care at home: Secondary | ICD-10-CM

## 2018-12-07 DIAGNOSIS — I890 Lymphedema, not elsewhere classified: Secondary | ICD-10-CM | POA: Diagnosis not present

## 2018-12-07 DIAGNOSIS — K219 Gastro-esophageal reflux disease without esophagitis: Secondary | ICD-10-CM | POA: Diagnosis not present

## 2018-12-07 DIAGNOSIS — J449 Chronic obstructive pulmonary disease, unspecified: Secondary | ICD-10-CM

## 2018-12-07 NOTE — Assessment & Plan Note (Signed)
Chronic, ongoing.  Not always using Symbicort or Spiriva due to cost and concern for weight gain + hoarseness with this.  Discussed multiple alternates to review cost.  Continue Proair.  CCM referral placed.

## 2018-12-07 NOTE — Therapy (Signed)
[place] MAIN [facility] SERVICES [address] [place], [name], [address] Phone: [phone]   Fax:  [phone]  Occupational Therapy Treatment  Patient Details  Name: CYRA SPADER MRN: [id number] Date of Birth: 1946/05/03 No data recorded  Encounter Date: 12/07/2018  OT End of Session - 12/07/18 1702    Visit Number  17    Number of Visits  36    Date for OT Re-Evaluation  02/28/19    OT Start Time  1000    OT Stop Time  1115    OT Time Calculation (min)  75 min    Activity Tolerance  Patient tolerated treatment well;No increased pain    Behavior During Therapy  WFL for tasks assessed/performed       Past Medical History:  Diagnosis Date  . Anxiety   . Arthritis    hands, upper back  . Asthma   . COPD (chronic obstructive pulmonary disease) ([place])   . History of cervical cancer   . Menopausal disorder   . Osteoporosis   . Pneumonia 1960  . Spasm of abdominal muscles of right side    intermittent  . TMJ (dislocation of temporomandibular joint)   . Wears dentures    partial lower    Past Surgical History:  Procedure Laterality Date  . ABDOMINAL HYSTERECTOMY  1970's  . bladder botox  2005  . BLADDER SUSPENSION  2004  . CATARACT EXTRACTION W/PHACO Right 03/09/2018   Procedure: CATARACT EXTRACTION PHACO AND INTRAOCULAR LENS PLACEMENT ([place]) right;  Surgeon: Eulogio Bear, MD;  Location: [place];  Service: Ophthalmology;  Laterality: Right;  CALL CELL 1ST  . CATARACT EXTRACTION W/PHACO Left 04/19/2018   Procedure: CATARACT EXTRACTION PHACO AND INTRAOCULAR LENS PLACEMENT (IOC)  LEFT;  Surgeon: Eulogio Bear, MD;  Location: [place];  Service: Ophthalmology;  Laterality: Left;  . COLONOSCOPY WITH PROPOFOL N/A 12/13/2015   Procedure: COLONOSCOPY WITH PROPOFOL;  Surgeon: Lucilla Lame, MD;  Location: [place];  Service: Endoscopy;  Laterality: N/A;  . LOOP RECORDER INSERTION N/A 01/07/2018    Procedure: LOOP RECORDER INSERTION;  Surgeon: Deboraha Sprang, MD;  Location: [place] [facility];  Service: Cardiovascular;  Laterality: N/A;  . POLYPECTOMY N/A 12/13/2015   Procedure: POLYPECTOMY;  Surgeon: Lucilla Lame, MD;  Location: [place];  Service: Endoscopy;  Laterality: N/A;  SIGMOID COLON POLYPS X  5  . SHOULDER ARTHROSCOPY W/ ROTATOR CUFF REPAIR Right 1998  . TEE WITHOUT CARDIOVERSION N/A 01/06/2018   Procedure: TRANSESOPHAGEAL ECHOCARDIOGRAM (TEE);  Surgeon: Minna Merritts, MD;  Location: [facility];  Service: Cardiovascular;  Laterality: N/A;  . TONSILLECTOMY AND ADENOIDECTOMY      There were no vitals filed for this visit.  Subjective Assessment - 12/07/18 1657    Subjective   Mrs Milan presents for OT Rx visit 17/ 36 to address BLE LE. Pt presents without compression garments in placee. Pt brings garments to clinic to don after session.    Patient is accompained by:  Family member    Pertinent History  Hx anxiety, COPD, OA, Asthma, hx cervical ca, osteoporosis, hx pneumonia, hx CVA, hx LLE cellulitis, hx L leg wound, hx falls    Limitations  chronic BLE leg swelling and associated pain, decreased balance, fall risk, decreased standing, walking and sitting tolerance, difficulty w/ lower body bathing and dressing, difficulty fitting LB clothing and shoes, unable to drive, impaired transfers and functional mobility, impaired social participation, impaired ability to  perform household chores, cooking, meal prep    Repetition  Increases Symptoms    Special Tests  Dopler negative for DVT    Patient Stated Goals  reduce leg pain and swelling so I can do more    Currently in Pain?  Yes    Pain Score  --   not rated numerically   Pain Location  Leg    Pain Orientation  Left;Right    Pain Descriptors / Indicators  Guarding;Tender;Pressure;Tightness;Heaviness;Tiring    Pain Type  Chronic pain                   OT Treatments/Exercises (OP) - 12/07/18 0001       ADLs   ADL Education Given  Yes      Manual Therapy   Manual Therapy  Edema management;Manual Lymphatic Drainage (MLD)    Manual Lymphatic Drainage (MLD)  MLD to RLE as established using functional LN    Compression Bandaging  Mod Independent using assistive devices ( Jovi Slippie (friction) Pad, Juzo Tyvek soc, Medi Butler   on-sz largeand extra time to don OTS compression thigh highs in clinic today,.             OT Education - 12/07/18 1701    Education Details  Continued Pt edu for donning compression garments using AE. Cont Pt edu re transition from Intensive Phase CDT to self-management phase with support. Pt collaborated on POC going forward for management transition.    Person(s) Educated  Patient;Spouse    Methods  Explanation;Demonstration;Handout;Tactile cues;Verbal cues    Comprehension  Verbalized understanding;Returned demonstration;Need further instruction;Verbal cues required;Tactile cues required          OT Long Term Goals - 11/30/18 1633      OT LONG TERM GOAL #1   Title  Pt will demonstrate understanding of lymphedema (LE) precautions / prevention principals, including signs / symptoms of cellulitis infection with modified independence using LE Workbook as printed reference to identify 6 precautions without verbal cues by end of 3rd  OT Rx visit.     Baseline  Max A    Time  3    Period  Days    Status  Achieved      OT LONG TERM GOAL #2   Title  Pt will be able to apply knee-length, multi-layer, short stretch compression wraps using correct gradient techniques with moderate  caregiver assistance to achieve optimal limb volume reduction during Intensive Phase CDT, and to return affected limb(s) as closely as possible, to premorbid size and shape.    Baseline  Max A    Time  2    Period  Weeks    Status  Achieved      OT LONG TERM GOAL #3   Title  Pt to achieve at least  10% limb volume reduction in affected limb(s)  bilaterally during Intensive Phase  CDT to control limb swelling, to improve tissue integrity and immune function, to improve ADLs performance and to improve functional mobility/ transfer, and to improve body image and self-esteem.    Baseline  dependent 09/23/2018 : Met for leg and thigh with 13.24% and 14.02% reductions. Full leg volume goal nearly achieved with 9.03% volume reduction.    Time  12    Period  Weeks    Status  Achieved      OT LONG TERM GOAL #4   Title  Pt will achieve 100% compliance with daily LE self-care home program components, including daily  skin care, simple self-MLD, gradient compression wraps/ compression garments/devices, and therapeutic exercise with Mod CG support to ensure optimal Intensive Phase limb volume reduction to expedite compression garment/ device fitting.    Baseline  dependent  10/26/2018: Pt ~ 50% compliant with 23/7 knee length compression wraps during clinical visit intervals. Pt using existing OTS compression knee highs on BLE  ( non clinical grade compression) when not wrapped    Time  12    Period  Weeks    Status  Achieved      OT LONG TERM GOAL #5   Title  Pt will use appropriate  assistive devices during LE self-care training to don compression garments/devices with modified independence to ensure optimal LE self-management over time and to limit progression of chronic LE.    Baseline  Max A    Time  12    Period  Weeks    Status  Achieved      OT LONG TERM GOAL #6   Title  Pt will retain optimal limb volume reductions achieved during Intensive Phase CDT with no more than 3% volume increase with ongoing CG assistance to limit LE progression and further functional decline.    Baseline  Max A    Time  6    Period  Months    Status  On-going            Plan - 12/07/18 1703    Clinical Impression Statement  Bilateral distal legs and feet mildly swollen and palpably congested this morning as Pt does not don compression garments before coming to clinic in the morning. She  understands that swelling increases with dependent positioning throughout the day. Redness in feet and legs significantly decreases with leg elevation by end of session. After contiued Pt edu for donning compression thigh highs using assistive devices, Pt able to use Juzo friction pad and donning butler with existin friction gloved   to don stockings using extra time. Had discussion re transition towards management phase CDT with support PRN in a cou8ple of weeks as planned, then Pt will call for F/U session ~ 2 weeks after her sequential compression device is delivered and she is using it according to recommended daily frequency on alternating legs. Pt has made excellent progress towards goals. Cont 1 x weekly for next 2 weeks to assist with fitting pneumatic device and to complete self care training.     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   Rehab Potential  Good    OT Frequency  3x / week    OT Duration  12 weeks    OT Treatment/Interventions  Self-care/ADL training;Therapeutic exercise;DME and/or AE instruction;Therapist, nutritional;Compression bandaging;Balance training;Other (comment);Manual Therapy;Coping strategies training;Patient/family education;Therapeutic activities;Manual lymph drainage;Energy conservation   skin care with low ph lotion   Clinical Decision Making  Several treatment options, min-mod task modification necessary    OT Home Exercise Plan  lymphatic pumping ther ex    Recommended Other Services  fit w/ ccl 2 knee length compression stockings and convoluted HOS devices to decrease fibrosis    Consulted and Agree with Plan of Care  Patient;Family member/caregiver    Family Member Consulted  spouse       Patient will benefit from skilled therapeutic intervention in order to improve the following deficits and  impairments:  Abnormal gait, Decreased balance, Decreased mobility, Decreased skin integrity, Difficulty walking, Decreased  range of motion, Increased edema, Pain, Decreased activity tolerance, Decreased knowledge of use of DME, Impaired flexibility  Visit Diagnosis: Lymphedema, not elsewhere classified    Problem List Patient Active Problem List   Diagnosis Date Noted  . GERD (gastroesophageal reflux disease) 12/07/2018  . Vitamin B12 deficiency 12/05/2018  . Senile purpura ([place]) 07/28/2018  . Chronic venous insufficiency 07/14/2018  . Lymphedema 07/14/2018  . Aneurysm of descending thoracic aorta (HCC) 05/04/2018  . Allergic rhinitis 02/16/2018  . Aortic atherosclerosis ([place]) 01/13/2018  . Status post placement of implantable loop recorder 01/12/2018  . Left-sided weakness 01/12/2018  . Chronic kidney disease, stage 3 ([place]) 01/12/2018  . Cerebrovascular accident (CVA) due to embolism of precerebral artery ([place])   . TIA (transient ischemic attack) 01/04/2018  . Osteoarthritis of wrist 12/09/2017  . Piriformis syndrome, left 11/25/2017  . CAD (coronary artery disease) 11/25/2017  . Caregiver stress 11/25/2017  . Menopause 08/14/2017  . Abnormal weight gain 08/14/2017  . De Quervain's tenosynovitis, right 07/17/2017  . Stress incontinence 06/19/2017  . Personal history of tobacco use, presenting hazards to health 04/01/2017  . Anxiety 03/13/2017  . Glossitis 11/14/2016  . Advanced care planning/counseling discussion 10/17/2016  . Other fecal abnormalities   . Benign neoplasm of sigmoid colon   . COPD (chronic obstructive pulmonary disease) ([place]) 10/17/2015  . Mass of upper inner quadrant of right breast 06/22/2015    Andrey Spearman, MS, OTR/L, [facility] 12/07/18 5:09 PM  [place] MAIN [facility] [place] SERVICES [address] [place], [name], [address] Phone: [phone]   Fax:  [phone]  Name: AICIA BABINSKI MRN: [id number] Date of  Birth: [birth year]

## 2018-12-07 NOTE — Patient Instructions (Signed)
Chronic Obstructive Pulmonary Disease Chronic obstructive pulmonary disease (COPD) is a long-term (chronic) lung problem. When you have COPD, it is hard for air to get in and out of your lungs. Usually the condition gets worse over time, and your lungs will never return to normal. There are things you can do to keep yourself as healthy as possible.  Your doctor may treat your condition with: ? Medicines. ? Oxygen. ? Lung surgery.  Your doctor may also recommend: ? Rehabilitation. This includes steps to make your body work better. It may involve a team of specialists. ? Quitting smoking, if you smoke. ? Exercise and changes to your diet. ? Comfort measures (palliative care). Follow these instructions at home: Medicines  Take over-the-counter and prescription medicines only as told by your doctor.  Talk to your doctor before taking any cough or allergy medicines. You may need to avoid medicines that cause your lungs to be dry. Lifestyle  If you smoke, stop. Smoking makes the problem worse. If you need help quitting, ask your doctor.  Avoid being around things that make your breathing worse. This may include smoke, chemicals, and fumes.  Stay active, but remember to rest as well.  Learn and use tips on how to relax.  Make sure you get enough sleep. Most adults need at least 7 hours of sleep every night.  Eat healthy foods. Eat smaller meals more often. Rest before meals. Controlled breathing Learn and use tips on how to control your breathing as told by your doctor. Try:  Breathing in (inhaling) through your nose for 1 second. Then, pucker your lips and breath out (exhale) through your lips for 2 seconds.  Putting one hand on your belly (abdomen). Breathe in slowly through your nose for 1 second. Your hand on your belly should move out. Pucker your lips and breathe out slowly through your lips. Your hand on your belly should move in as you breathe out.  Controlled coughing Learn  and use controlled coughing to clear mucus from your lungs. Follow these steps: 1. Lean your head a little forward. 2. Breathe in deeply. 3. Try to hold your breath for 3 seconds. 4. Keep your mouth slightly open while coughing 2 times. 5. Spit any mucus out into a tissue. 6. Rest and do the steps again 1 or 2 times as needed. General instructions  Make sure you get all the shots (vaccines) that your doctor recommends. Ask your doctor about a flu shot and a pneumonia shot.  Use oxygen therapy and pulmonary rehabilitation if told by your doctor. If you need home oxygen therapy, ask your doctor if you should buy a tool to measure your oxygen level (oximeter).  Make a COPD action plan with your doctor. This helps you to know what to do if you feel worse than usual.  Manage any other conditions you have as told by your doctor.  Avoid going outside when it is very hot, cold, or humid.  Avoid people who have a sickness you can catch (contagious).  Keep all follow-up visits as told by your doctor. This is important. Contact a doctor if:  You cough up more mucus than usual.  There is a change in the color or thickness of the mucus.  It is harder to breathe than usual.  Your breathing is faster than usual.  You have trouble sleeping.  You need to use your medicines more often than usual.  You have trouble doing your normal activities such as getting dressed   or walking around the house. Get help right away if:  You have shortness of breath while resting.  You have shortness of breath that stops you from: ? Being able to talk. ? Doing normal activities.  Your chest hurts for longer than 5 minutes.  Your skin color is more blue than usual.  Your pulse oximeter shows that you have low oxygen for longer than 5 minutes.  You have a fever.  You feel too tired to breathe normally. Summary  Chronic obstructive pulmonary disease (COPD) is a long-term lung problem.  The way your  lungs work will never return to normal. Usually the condition gets worse over time. There are things you can do to keep yourself as healthy as possible.  Take over-the-counter and prescription medicines only as told by your doctor.  If you smoke, stop. Smoking makes the problem worse. This information is not intended to replace advice given to you by your health care provider. Make sure you discuss any questions you have with your health care provider. Document Released: 03/17/2008 Document Revised: 11/03/2016 Document Reviewed: 11/03/2016 Elsevier Interactive Patient Education  2019 Elsevier Inc.  

## 2018-12-07 NOTE — Progress Notes (Signed)
BP 133/76 (BP Location: Right Arm, Patient Position: Sitting, Cuff Size: Normal)   Pulse 79   Temp 98 F (36.7 C) (Oral)   Ht 5' 8.9" (1.75 m)   Wt 214 lb 6 oz (97.2 kg)   LMP  (LMP Unknown)   SpO2 96%   BMI 31.75 kg/m    Subjective:    Patient ID: Sharon [name], female    DOB: 1946-09-07, 73 y.o.   MRN: [id number]  HPI: Sharon [name] is a 73 y.o. female  Chief Complaint  Patient presents with  . Follow-up  . COPD   COPD Reports she now has coverage for medications through [place] via her husband.  Discussed at length current inhaler regimen with patient as she reports not always using Symbicort as ordered due to cost, $98 a month.  Does use Proair more often.  Not using Spiriva due to cost.  Reviewed various alternate options to determine if coverage present: Trelegy, Anoro, Advair.  Also discussed benefit of adding CCM referral for assistance with medication review and options + LCSW for caregiver strain.  She agrees to this referral. COPD status: stable Satisfied with current treatment?: yes Oxygen use: no Dyspnea frequency: none Cough frequency: chronic, intermittent a few times a day Rescue inhaler frequency:  Twice a day --- not always taking maintenance meds as ordered d/t cost Limitation of activity: no Productive cough: none Last Spirometry: none Pneumovax: Up to Date Influenza: Up to Date   GERD Continues on Protonix for GERD.  Has seen GI in past for this.  Was told she is poor candidate for EGD due to ASA/Plavix. GERD control status: controlled  Satisfied with current treatment? yes Heartburn frequency: none Medication side effects: no  Medication compliance: good Previous GERD medications: none Antacid use frequency:  none Duration:  Nature:  Location:  Heartburn duration:  Alleviatiating factors:   Aggravating factors:  Dysphagia: no Odynophagia:  no Hematemesis: no Blood in stool: no EGD: no  CAREGIVER STRESS: Is concerned about weight gain and  stressors.  Has seen GI and sees cardiology with normal labs.  Currently taking Celexa as needed 20 MG.  Questioned whether she could take this daily as 1/2 tablet, 10 MG.     Relevant past medical, surgical, family and social history reviewed and updated as indicated. Interim medical history since our last visit reviewed. Allergies and medications reviewed and updated.  Review of Systems  Constitutional: Negative for activity change, appetite change, diaphoresis, fatigue and fever.  Respiratory: Negative for cough, chest tightness and shortness of breath.   Cardiovascular: Negative for chest pain, palpitations and leg swelling.  Gastrointestinal: Negative for abdominal distention, abdominal pain, constipation, diarrhea, nausea and vomiting.  Endocrine: Negative for cold intolerance, heat intolerance, polydipsia, polyphagia and polyuria.  Neurological: Negative for dizziness, syncope, weakness, light-headedness, numbness and headaches.  Psychiatric/Behavioral: Negative.     Per HPI unless specifically indicated above     Objective:    BP 133/76 (BP Location: Right Arm, Patient Position: Sitting, Cuff Size: Normal)   Pulse 79   Temp 98 F (36.7 C) (Oral)   Ht 5' 8.9" (1.75 m)   Wt 214 lb 6 oz (97.2 kg)   LMP  (LMP Unknown)   SpO2 96%   BMI 31.75 kg/m   Wt Readings from Last 3 Encounters:  12/07/18 214 lb 6 oz (97.2 kg)  10/28/18 211 lb (95.7 kg)  10/19/18 216 lb 9.6 oz (98.2 kg)    Physical Exam Vitals signs and  nursing note reviewed.  Constitutional:      Appearance: She is well-developed.  HENT:     Head: Normocephalic.  Eyes:     General:        Right eye: No discharge.        Left eye: No discharge.     Conjunctiva/sclera: Conjunctivae normal.     Pupils: Pupils are equal, round, and reactive to light.  Neck:     Musculoskeletal: Normal range of motion and neck supple.     Thyroid: No thyromegaly.     Vascular: No carotid bruit or JVD.  Cardiovascular:     Rate  and Rhythm: Normal rate and regular rhythm.     Heart sounds: Normal heart sounds.  Pulmonary:     Effort: Pulmonary effort is normal.     Breath sounds: Normal breath sounds.  Abdominal:     General: Bowel sounds are normal.     Palpations: Abdomen is soft.  Lymphadenopathy:     Cervical: No cervical adenopathy.  Skin:    General: Skin is warm and dry.  Neurological:     Mental Status: She is alert and oriented to person, place, and time.  Psychiatric:        Mood and Affect: Mood normal.        Behavior: Behavior normal.        Thought Content: Thought content normal.        Judgment: Judgment normal.     Results for orders placed or performed in visit on 11/30/18  CUP PACEART REMOTE DEVICE CHECK  Result Value Ref Range   Date Time Interrogation Session [id number]    Pulse Generator Manufacturer MERM    Pulse Gen Model GSU11 Reveal LINQ    Pulse Gen Serial Number [id number]    Clinic Name [facility]    Implantable Pulse Generator Type ICM/ILR    Implantable Pulse Generator Implant Date [id number]       Assessment & Plan:   Problem List Items Addressed This Visit      Respiratory   COPD (chronic obstructive pulmonary disease) ([place]) - Primary    Chronic, ongoing.  Not always using Symbicort or Spiriva due to cost and concern for weight gain + hoarseness with this.  Discussed multiple alternates to review cost.  Continue Proair.  CCM referral placed.        Digestive   GERD (gastroesophageal reflux disease)    Chronic, ongoing.  Continue Protonix.          Other   Caregiver stress   Relevant Orders   Ambulatory referral to Chronic Care Management Services       Follow up plan: Return in about 6 months (around 06/07/2019) for COPD.

## 2018-12-07 NOTE — Assessment & Plan Note (Signed)
Chronic, ongoing.  Continue Protonix.

## 2018-12-08 NOTE — Progress Notes (Signed)
Carelink Summary Report / Loop Recorder 

## 2018-12-09 ENCOUNTER — Ambulatory Visit: Payer: Medicare Other | Admitting: Occupational Therapy

## 2018-12-13 ENCOUNTER — Ambulatory Visit: Payer: Self-pay | Admitting: *Deleted

## 2018-12-13 NOTE — Chronic Care Management (AMB) (Signed)
  Chronic Care Management   Outreach Note  12/13/2018 Name: Sharon [name] MRN: [id number] DOB: 07-07-1946  Referred by: [name], NP Reason for referral : Care Coordination (new referral - Caregiver Stress)   An unsuccessful telephone outreach to @M @ @NAME @ was attempted today. @M @ @NAME @ was referred to the case management team by [name] NP for assistance with care coordination and caregiver stress concerns.  Follow Up Plan: The CM team will reach out to the patient again over the next 3 days.    Janalyn Shy MHA,BSN,RN,CCM Nurse Care Coordinator [facility] Care Management [phone]

## 2018-12-14 ENCOUNTER — Ambulatory Visit: Payer: Medicare Other | Attending: Family Medicine | Admitting: Occupational Therapy

## 2018-12-14 DIAGNOSIS — I890 Lymphedema, not elsewhere classified: Secondary | ICD-10-CM | POA: Diagnosis not present

## 2018-12-14 NOTE — Therapy (Deleted)
[place] MAIN [facility] SERVICES [address] [place], [name], [address] Phone: [phone]   Fax:  [phone]  Occupational Therapy Treatment  Patient Details  Name: Sharon [name] MRN: [id number] Date of Birth: [birth year] No data recorded  Encounter Date: 12/14/2018  OT End of Session - 12/14/18 1725    Visit Number  18    Number of Visits  36    Date for OT Re-Evaluation  02/28/19    OT Start Time  1005    OT Stop Time  1120    OT Time Calculation (min)  75 min    Activity Tolerance  Patient tolerated treatment well;No increased pain    Behavior During Therapy  WFL for tasks assessed/performed       Past Medical History:  Diagnosis Date  . Anxiety   . Arthritis    hands, upper back  . Asthma   . COPD (chronic obstructive pulmonary disease) ([place])   . History of cervical cancer   . Menopausal disorder   . Osteoporosis   . Pneumonia 1960  . Spasm of abdominal muscles of right side    intermittent  . TMJ (dislocation of temporomandibular joint)   . Wears dentures    partial lower    Past Surgical History:  Procedure Laterality Date  . ABDOMINAL HYSTERECTOMY  1970's  . bladder botox  2005  . BLADDER SUSPENSION  2004  . CATARACT EXTRACTION W/PHACO Right 03/09/2018   Procedure: CATARACT EXTRACTION PHACO AND INTRAOCULAR LENS PLACEMENT ([place]) right;  Surgeon: Eulogio Bear, MD;  Location: [place];  Service: Ophthalmology;  Laterality: Right;  CALL CELL 1ST  . CATARACT EXTRACTION W/PHACO Left 04/19/2018   Procedure: CATARACT EXTRACTION PHACO AND INTRAOCULAR LENS PLACEMENT (IOC)  LEFT;  Surgeon: Eulogio Bear, MD;  Location: [place];  Service: Ophthalmology;  Laterality: Left;  . COLONOSCOPY WITH PROPOFOL N/A 12/13/2015   Procedure: COLONOSCOPY WITH PROPOFOL;  Surgeon: Lucilla Lame, MD;  Location: [place];  Service: Endoscopy;  Laterality: N/A;  . LOOP RECORDER INSERTION N/A 01/07/2018    Procedure: LOOP RECORDER INSERTION;  Surgeon: Deboraha Sprang, MD;  Location: [place] [facility];  Service: Cardiovascular;  Laterality: N/A;  . POLYPECTOMY N/A 12/13/2015   Procedure: POLYPECTOMY;  Surgeon: Lucilla Lame, MD;  Location: [place];  Service: Endoscopy;  Laterality: N/A;  SIGMOID COLON POLYPS X  5  . SHOULDER ARTHROSCOPY W/ ROTATOR CUFF REPAIR Right 1998  . TEE WITHOUT CARDIOVERSION N/A 01/06/2018   Procedure: TRANSESOPHAGEAL ECHOCARDIOGRAM (TEE);  Surgeon: Minna Merritts, MD;  Location: [facility];  Service: Cardiovascular;  Laterality: N/A;  . TONSILLECTOMY AND ADENOIDECTOMY      There were no vitals filed for this visit.  Subjective Assessment - 12/14/18 1719    Subjective   Sharon [name] presents for OT Rx visit 18/ 36 to address BLE LE. Pt presents without compression garments in placee. Pt brings garments to clinic to don after Rx. Pt c/o  increased leg swelling today. When asked why she wasn't wearing compression stockings she said, "It's just habit. I know I need to put them on first thing, buty I dont when I'm coming over here.    Patient is accompanied by:  Family member    Pertinent History  Hx anxiety, COPD, OA, Asthma, hx cervical ca, osteoporosis, hx pneumonia, hx CVA, hx LLE cellulitis, hx L leg wound, hx falls    Limitations  chronic BLE leg swelling and associated  pain, decreased balance, fall risk, decreased standing, walking and sitting tolerance, difficulty w/ lower body bathing and dressing, difficulty fitting LB clothing and shoes, unable to drive, impaired transfers and functional mobility, impaired social participation, impaired ability to perform household chores, cooking, meal prep    Repetition  Increases Symptoms    Special Tests  Dopler negative for DVT    Patient Stated Goals  reduce leg pain and swelling so I can do more    Currently in Pain?  Yes   not rated numerically   Pain Orientation  Right;Left    Pain Descriptors / Indicators   Tender;Sore;Pressure;Numbness;Discomfort;Heaviness;Tightness;Tingling;Pins and needles;Tiring    Pain Type  Chronic pain                   OT Treatments/Exercises (OP) - 12/14/18 0001      ADLs   ADL Education Given  Yes      Manual Therapy   Manual Therapy  Edema management;Manual Lymphatic Drainage (MLD)    Manual Lymphatic Drainage (MLD)  MLD to LLE as established using functional LN    Compression Bandaging  Mod Independent using assistive devices ( Jovi Slippie (friction) Pad, Juzo Tyvek soc, Medi Butler   on-sz largeand extra time to don OTS compression thigh highs in clinic today,.             OT Education - 12/14/18 1723    Education Details  Pt edu for techniques for improving habituation of LE self care routines.     Person(s) Educated  Patient;Spouse    Methods  Explanation;Demonstration;Handout;Tactile cues;Verbal cues    Comprehension  Verbalized understanding;Returned demonstration;Need further instruction;Verbal cues required;Tactile cues required          OT Long Term Goals - 11/30/18 1633      OT LONG TERM GOAL #1   Title  Pt will demonstrate understanding of lymphedema (LE) precautions / prevention principals, including signs / symptoms of cellulitis infection with modified independence using LE Workbook as printed reference to identify 6 precautions without verbal cues by end of 3rd  OT Rx visit.     Baseline  Max A    Time  3    Period  Days    Status  Achieved      OT LONG TERM GOAL #2   Title  Pt will be able to apply knee-length, multi-layer, short stretch compression wraps using correct gradient techniques with moderate  caregiver assistance to achieve optimal limb volume reduction during Intensive Phase CDT, and to return affected limb(s) as closely as possible, to premorbid size and shape.    Baseline  Max A    Time  2    Period  Weeks    Status  Achieved      OT LONG TERM GOAL #3   Title  Pt to achieve at least  10% limb volume  reduction in affected limb(s)  bilaterally during Intensive Phase CDT to control limb swelling, to improve tissue integrity and immune function, to improve ADLs performance and to improve functional mobility/ transfer, and to improve body image and self-esteem.    Baseline  dependent 09/23/2018 : Met for leg and thigh with 13.24% and 14.02% reductions. Full leg volume goal nearly achieved with 9.03% volume reduction.    Time  12    Period  Weeks    Status  Achieved      OT LONG TERM GOAL #4   Title  Pt will achieve 100% compliance with daily LE  self-care home program components, including daily  skin care, simple self-MLD, gradient compression wraps/ compression garments/devices, and therapeutic exercise with Mod CG support to ensure optimal Intensive Phase limb volume reduction to expedite compression garment/ device fitting.    Baseline  dependent  10/26/2018: Pt ~ 50% compliant with 23/7 knee length compression wraps during clinical visit intervals. Pt using existing OTS compression knee highs on BLE  ( non clinical grade compression) when not wrapped    Time  12    Period  Weeks    Status  Achieved      OT LONG TERM GOAL #5   Title  Pt will use appropriate  assistive devices during LE self-care training to don compression garments/devices with modified independence to ensure optimal LE self-management over time and to limit progression of chronic LE.    Baseline  Max A    Time  12    Period  Weeks    Status  Achieved      OT LONG TERM GOAL #6   Title  Pt will retain optimal limb volume reductions achieved during Intensive Phase CDT with no more than 3% volume increase with ongoing CG assistance to limit LE progression and further functional decline.    Baseline  Max A    Time  6    Period  Months    Status  On-going            Plan - 12/14/18 1726    Clinical Impression Statement  Increased redness and swelling below knees oberved this morning due to Pt being up and active for  several hours with legs in gravity dependent position prior to session. After skilled edu Pt verbalized understanding that 1without high degree of compliance with daily compression  garments  prognosis for retaining clinical gains achieved in OT is poor. Pt had no difficulty tolerating MLD to RLE/RLQ  today. Pt donned compression garments using assistive devices without any therapist assistance today. Required inordinate amout of time, but Pt demonstrated low frustration tolerance. Next visit is  last scheduled visit. If lymphedema is stable we'll DC after next visit.     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   Rehab Potential  Good    Clinical Decision Making  Several treatment options, min-mod task modification necessary    OT Frequency  3x / week    OT Duration  12 weeks    OT Treatment/Interventions  Self-care/ADL training;Therapeutic exercise;DME and/or AE instruction;Therapist, nutritional;Compression bandaging;Balance training;Other (comment);Manual Therapy;Coping strategies training;Patient/family education;Therapeutic activities;Manual lymph drainage;Energy conservation   skin care with low ph lotion   OT Home Exercise Plan  lymphatic pumping ther ex    Recommended Other Services  fit w/ ccl 2 knee length compression stockings and convoluted HOS devices to decrease fibrosis    Consulted and Agree with Plan of Care  Patient;Family member/caregiver    Family Member Consulted  spouse       Patient will benefit from skilled therapeutic intervention in order to improve the following deficits and impairments:     Visit Diagnosis: Lymphedema, not elsewhere classified    Problem List Patient Active Problem List   Diagnosis Date Noted  . GERD (gastroesophageal reflux disease) 12/07/2018  . Vitamin B12 deficiency 12/05/2018   . Senile purpura ([place]) 07/28/2018  . Chronic venous insufficiency 07/14/2018  . Lymphedema 07/14/2018  . Aneurysm of descending thoracic aorta (HCC)  05/04/2018  . Allergic rhinitis 02/16/2018  . Aortic atherosclerosis ([place]) 01/13/2018  . Status post placement of implantable loop recorder 01/12/2018  . Left-sided weakness 01/12/2018  . Chronic kidney disease, stage 3 ([place]) 01/12/2018  . Cerebrovascular accident (CVA) due to embolism of precerebral artery ([place])   . TIA (transient ischemic attack) 01/04/2018  . Osteoarthritis of wrist 12/09/2017  . Piriformis syndrome, left 11/25/2017  . CAD (coronary artery disease) 11/25/2017  . Caregiver stress 11/25/2017  . Menopause 08/14/2017  . Abnormal weight gain 08/14/2017  . De Quervain's tenosynovitis, right 07/17/2017  . Stress incontinence 06/19/2017  . Personal history of tobacco use, presenting hazards to health 04/01/2017  . Anxiety 03/13/2017  . Glossitis 11/14/2016  . Advanced care planning/counseling discussion 10/17/2016  . Other fecal abnormalities   . Benign neoplasm of sigmoid colon   . COPD (chronic obstructive pulmonary disease) ([place]) 10/17/2015  . Mass of upper inner quadrant of right breast 06/22/2015    Andrey Spearman, MS, OTR/L, [facility] 12/14/18 5:31 PM  [place] MAIN [facility] SERVICES [address] [place], [name], [address] Phone: [phone]   Fax:  [phone]  Name: Sharon [name] MRN: [id number] Date of Birth: 1946/05/04

## 2018-12-16 ENCOUNTER — Ambulatory Visit: Payer: Medicare Other | Admitting: Occupational Therapy

## 2018-12-21 ENCOUNTER — Ambulatory Visit (INDEPENDENT_AMBULATORY_CARE_PROVIDER_SITE_OTHER): Payer: Medicare Other

## 2018-12-21 ENCOUNTER — Ambulatory Visit: Payer: Medicare Other | Admitting: Cardiovascular Disease

## 2018-12-21 ENCOUNTER — Ambulatory Visit: Payer: Self-pay | Admitting: Licensed Clinical Social Worker

## 2018-12-21 ENCOUNTER — Ambulatory Visit: Payer: Medicare Other | Admitting: Occupational Therapy

## 2018-12-21 ENCOUNTER — Encounter: Payer: Self-pay | Admitting: Licensed Clinical Social Worker

## 2018-12-21 DIAGNOSIS — Z636 Dependent relative needing care at home: Secondary | ICD-10-CM

## 2018-12-21 DIAGNOSIS — K140 Glossitis: Secondary | ICD-10-CM | POA: Diagnosis not present

## 2018-12-21 NOTE — Chronic Care Management (AMB) (Signed)
Chronic Care Management    Clinical Social Work General Note  12/21/2018 Name: Sharon [name] MRN: [id number] DOB: 07-20-1946  SARAYAH BACCHI is a 73 y.o. year old female who is a primary care patient of [name], [name], NP. Ms.[name] asked the CCM team to consult the patient for assistance with Community Resources and Level of Care Concerns.   Ms. Hannis was given information about Chronic Care Management services today including:  1. CCM service includes personalized support from designated clinical staff supervised by her physician, including individualized plan of care and coordination with other care providers 2. 24/7 contact phone numbers for assistance for urgent and routine care needs. 3. Service will only be billed when office clinical staff spend 20 minutes or more in a month to coordinate care. 4. Only one practitioner may furnish and bill the service in a calendar month. 5. The patient may stop CCM services at any time (effective at the end of the month) by phone call to the office staff. 6. The patient will be responsible for cost sharing (co-pay) of up to 20% of the service fee (after annual deductible is met).  Patient agreed to services and verbal consent obtained.   Review of patient status, including review of consultants reports, relevant laboratory and other test results, and collaboration with appropriate care team members and the patient's provider was performed as part of comprehensive patient evaluation and provision of chronic care management services.    SDOH (Social Determinants of Health) screening performed today. See Care Plan Entry related to challenges with: Stress-Related to Caregiver Support within the home.  Depression screen [facility] 2/9 12/21/2018 12/07/2018 01/14/2018  Decreased Interest 0 0 1  Down, Depressed, Hopeless 0 0 1  PHQ - 2 Score 0 0 2  Altered sleeping 0 0 0  Tired, decreased energy 0 0 1  Change in appetite 0 0 1  Feeling bad or failure about  yourself  0 0 0  Trouble concentrating 0 0 0  Moving slowly or fidgety/restless 0 0 0  Suicidal thoughts 0 0 0  PHQ-9 Score 0 0 4  Difficult doing work/chores Not difficult at all Not difficult at all Not difficult at all     GAD 7 : Generalized Anxiety Score 12/07/2018  Nervous, Anxious, on Edge 0  Control/stop worrying 0  Worry too much - different things 0  Trouble relaxing 0  Restless 0  Easily annoyed or irritable 0  Afraid - awful might happen 0  Total GAD 7 Score 0     Goals Addressed            This Visit's Progress   . "I don't have a lot of education on resources available to me and my spouse" (pt-stated)       Current Barriers:  . Limited education about Caregiver Support Resources* . Limited access to caregiver . Inability to perform IADL's independently - Pt currently in OT related to lower extremity swelling and treatment of leg wound. Patient reports that she and spouse share and assist one another with performance of IADL's.   Clinical Social Work Clinical Goal(s):  Marland Kitchen Over the next 14 days, client will work with SW to address concerns related to lack of caregiver support within the home.  Interventions: . Patient interviewed and appropriate assessments performed . Discussed plans with patient for ongoing care management follow up and provided patient with direct contact information for care management team . Provided education to patient/caregiver regarding level of care  options including PACE and day programs which patient currently declines.   Patient Self Care Activities:  . Attends all scheduled provider appointments . Unable to independently perform all IADL's . Calls provider office for new concerns or questions  Plan:  . LCSW will follow up within 14 days and follow up on mailed community resources that were sent out on 12/21/2018  Initial goal documentation    . "I want to know my options that are free of cost in terms of caregiving resources"        Current Barriers:  . ADL IADL limitations . Limited education about Caregiver resources free of charge and available to her and spouse* . Limited access to caregiver  Clinical Social Work Clinical Goal(s):  Marland Kitchen Over the next 14 days, client will work with SW to address concerns related to lack of support within the home and lack of planning in terms of long term care  Interventions: . Patient interviewed and appropriate assessments performed . Provided patient with information about caregiver support resources within the area as well as level of care  . Advised patient to check mail box for resources that will be sent out on 12/21/2018 . Provided education to patient/caregiver regarding level of care options.  Patient Self Care Activities:  . Calls provider office for new concerns or questions  Plan:  . Patient will check mailbox for mailed resources sent out by LCSW on 12/21/2018 . LCSW willfollow up with patient telephonically within 14 days  Initial goal documentation   Follow Up Plan: SW will follow up with patient by phone over the next 14 days      [name], BSW, MSW, [email or web address] Phone: [phone]

## 2018-12-21 NOTE — Patient Instructions (Addendum)
Licensed Clinical Social Worker Visit Information  Goals we discussed today:  Goals Addressed            This Visit's Progress   . "I don't have a lot of education on resources available to me and my spouse" (pt-stated)       Current Barriers:  . Limited education about Caregiver Support Resources* . Limited access to caregiver . Inability to perform IADL's independently  Pt currently in OT related to lower extremity swelling and treatment of leg wound. Patient reports that she and spouse share and assist one another with performance of IADL's.   Clinical Social Work Clinical Goal(s):  Marland Kitchen Over the next 14 days, client will work with SW to address concerns related to lack of caregiver support within the home.  Interventions: . Patient interviewed and appropriate assessments performed . Discussed plans with patient for ongoing care management follow up and provided patient with direct contact information for care management team . Provided education to patient/caregiver regarding level of care options. including PACE and day programs which patient currently declines.   Patient Self Care Activities:  . Attends all scheduled provider appointments . Calls provider office for new concerns or questions  Plan:  . LCSW will follow up within 14 days and follow up on mailed community resources that were sent out on 12/21/2018  Initial goal documentation    . "I want to know my options that are free of cost in terms of caregiving resources"       Current Barriers:  . ADL IADL limitations . Limited education about Caregiver resources free of charge and available to her and spouse* . Limited access to caregiver  Clinical Social Work Clinical Goal(s):  Marland Kitchen Over the next 14 days, client will work with SW to address concerns related to lack of support within the home and lack of planning in terms of long term care  Interventions: . Patient interviewed and appropriate assessments  performed . Provided patient with information about caregiver support resources within the area as well as level of care  . Advised patient to check mail box for resources that will be sent out on 12/21/2018 . Provided education to patient/caregiver regarding level of care options-in terms of independent living facility, assisted living facility and skilled nursing facility.   Patient Self Care Activities:  . Calls provider office for new concerns or questions  Plan:  . Patient will check mailbox for mailed resources sent out by LCSW on 12/21/2018 . LCSW willfollow up with patient telephonically within 14 days  Initial goal documentation         Materials provided: Verbal education about PACE and Day Programs provided by phone  Ms. Shubert was given information about Chronic Care Management services today including:  1. CCM service includes personalized support from designated clinical staff supervised by her physician, including individualized plan of care and coordination with other care providers 2. 24/7 contact phone numbers for assistance for urgent and routine care needs. 3. Service will only be billed when office clinical staff spend 20 minutes or more in a month to coordinate care. 4. Only one practitioner may furnish and bill the service in a calendar month. 5. The patient may stop CCM services at any time (effective at the end of the month) by phone call to the office staff. 6. The patient will be responsible for cost sharing (co-pay) of up to 20% of the service fee (after annual deductible is met).  Patient agreed to services  and verbal consent obtained.   The patient verbalized understanding of instructions provided today and declined a print copy of patient instruction materials.   Follow up plan: SW will follow up with patient by phone over the next 14 days   [name], [place], MSW, [email or web address] Phone:  [phone] Fax: [phone]

## 2018-12-23 ENCOUNTER — Ambulatory Visit: Payer: Medicare Other | Admitting: Occupational Therapy

## 2018-12-27 ENCOUNTER — Ambulatory Visit: Admitting: Cardiovascular Disease

## 2018-12-29 NOTE — Patient Instructions (Signed)

## 2018-12-29 NOTE — Therapy (Signed)
[place] MAIN [facility] SERVICES [address] [place], [name], [address] Phone: [phone]   Fax:  [phone]  Occupational Therapy Treatment  Patient Details  Name: Sharon [name] MRN: [id number] Date of Birth: 06-16-1946 No data recorded  Encounter Date: 12/14/2018    Past Medical History:  Diagnosis Date  . Anxiety   . Arthritis    hands, upper back  . Asthma   . COPD (chronic obstructive pulmonary disease) ([place])   . History of cervical cancer   . Menopausal disorder   . Osteoporosis   . Pneumonia 1960  . Spasm of abdominal muscles of right side    intermittent  . TMJ (dislocation of temporomandibular joint)   . Wears dentures    partial lower    Past Surgical History:  Procedure Laterality Date  . ABDOMINAL HYSTERECTOMY  1970's  . bladder botox  2005  . BLADDER SUSPENSION  2004  . CATARACT EXTRACTION W/PHACO Right 03/09/2018   Procedure: CATARACT EXTRACTION PHACO AND INTRAOCULAR LENS PLACEMENT ([place]) right;  Surgeon: Eulogio Bear, MD;  Location: [place];  Service: Ophthalmology;  Laterality: Right;  CALL CELL 1ST  . CATARACT EXTRACTION W/PHACO Left 04/19/2018   Procedure: CATARACT EXTRACTION PHACO AND INTRAOCULAR LENS PLACEMENT (IOC)  LEFT;  Surgeon: Eulogio Bear, MD;  Location: [facility];  Service: Ophthalmology;  Laterality: Left;  . COLONOSCOPY WITH PROPOFOL N/A 12/13/2015   Procedure: COLONOSCOPY WITH PROPOFOL;  Surgeon: Lucilla Lame, MD;  Location: [place] [facility];  Service: Endoscopy;  Laterality: N/A;  . LOOP RECORDER INSERTION N/A 01/07/2018   Procedure: LOOP RECORDER INSERTION;  Surgeon: Deboraha Sprang, MD;  Location: [facility];  Service: Cardiovascular;  Laterality: N/A;  . POLYPECTOMY N/A 12/13/2015   Procedure: POLYPECTOMY;  Surgeon: Lucilla Lame, MD;  Location: [facility];  Service: Endoscopy;  Laterality: N/A;  SIGMOID COLON POLYPS X  5  . SHOULDER ARTHROSCOPY W/  ROTATOR CUFF REPAIR Right 1998  . TEE WITHOUT CARDIOVERSION N/A 01/06/2018   Procedure: TRANSESOPHAGEAL ECHOCARDIOGRAM (TEE);  Surgeon: Minna Merritts, MD;  Location: [facility];  Service: Cardiovascular;  Laterality: N/A;  . TONSILLECTOMY AND ADENOIDECTOMY      There were no vitals filed for this visit.                OT Treatments/Exercises (OP) - 12/29/18 0001      Manual Therapy   Manual Therapy  Edema management;Manual Lymphatic Drainage (MLD)    Manual Lymphatic Drainage (MLD)  MLD to LLE as established using functional LN    Compression Bandaging  Mod Independent using assistive devices ( Jovi Slippie (friction) Pad, Juzo Tyvek soc, Medi Butler   on-sz largeand extra time to don OTS compression thigh highs in clinic today,.                  OT Long Term Goals - 12/29/18 1146      OT LONG TERM GOAL #1   Title  Pt will demonstrate understanding of lymphedema (LE) precautions / prevention principals, including signs / symptoms of cellulitis infection with modified independence using LE Workbook as printed reference to identify 6 precautions without verbal cues by end of 3rd  OT Rx visit.     Baseline  Max A    Time  3    Period  Days    Status  Achieved      OT LONG TERM GOAL #2   Title  Pt will  be able to apply knee-length, multi-layer, short stretch compression wraps using correct gradient techniques with moderate  caregiver assistance to achieve optimal limb volume reduction during Intensive Phase CDT, and to return affected limb(s) as closely as possible, to premorbid size and shape.    Baseline  Max A    Time  2    Period  Weeks    Status  Achieved      OT LONG TERM GOAL #3   Title  Pt to achieve at least  10% limb volume reduction in affected limb(s)  bilaterally during Intensive Phase CDT to control limb swelling, to improve tissue integrity and immune function, to improve ADLs performance and to improve functional mobility/ transfer, and to  improve body image and self-esteem.    Baseline  dependent 09/23/2018 : Met for L leg and L thigh with segments with 13.24% and 14.02% reductions respectively. Full LLE leg volume goal nearly achieved with 9.03% volume reduction.    Time  12    Period  Weeks    Status  Achieved      OT LONG TERM GOAL #4   Title  Pt will achieve 100% compliance with daily LE self-care home program components, including daily  skin care, simple self-MLD, gradient compression wraps/ compression garments/devices, and therapeutic exercise with Mod CG support to ensure optimal Intensive Phase limb volume reduction to expedite compression garment/ device fitting.    Baseline  Pt 85% compliant with daytime compression as instructed. She wears stockings daily, just does not always don stockings first thing in the morning due to caregiver demands of spouse. Pt appreciates ongoing encouragement and valdation of progress achieved thus far.    Time  12    Period  Weeks    Status  Partially Met      OT LONG TERM GOAL #5   Title  Pt will use appropriate  assistive devices during LE self-care training to don compression garments/devices with modified independence to ensure optimal LE self-management over time and to limit progression of chronic LE.    Baseline  Max A    Time  12    Period  Weeks    Status  Achieved      OT LONG TERM GOAL #6   Title  Pt will retain optimal limb volume reductions achieved during Intensive Phase CDT with no more than 3% volume increase with ongoing CG assistance to limit LE progression and further functional decline.    Baseline  Max A    Time  6    Period  Months    Status  On-going            Plan - 12/29/18 1142    Clinical Impression Statement  Sharon [name] continues to present with mild BLE redness, 1+ pitting and ankle and distal leg swelling this morning due to being up and active in gravity dependent position for several hours prior to OT session without compression garments in  place prior to session. Pt reiterates that she utilizes compression daily as instructed, just has difficulty getting them on  when she rises in the morning due to caregiver demands for spouse with chronic illness. After skilled edu Pt verbalized understanding that without high degree of compliance with daily compression  garments  prognosis for retaining clinical gains achieved in OT is poor.  She verbalizes gratitude for encouragement and validation of progress towards all goals thus far. Pt tolerated MLD without increased pain/ discomfort today. Pt denies tenderness during MLD  to RLE/RLQ  today. She donned compression garments using assistive devices and extra time without any therapist assistance today.  The Pt has one remaining scheduled OT visit for follow along. She has achieved volumetric reduction goal for Next visit is  last scheduled visit. If lymphedema is stable we'll DC after next visit. She remains an excellent candidate for sequential pneumatic compression device to assist with ongoing, long term  lymphedema self management  essential for limiting  recurrent infections and progression of LE.    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   Rehab Potential  Good    Clinical Decision Making  Several treatment options, min-mod task modification necessary    OT Frequency  3x / week    OT Duration  12 weeks    OT Treatment/Interventions  Self-care/ADL training;Therapeutic exercise;DME and/or AE instruction;Therapist, nutritional;Compression bandaging;Balance training;Other (comment);Manual Therapy;Coping strategies training;Patient/family education;Therapeutic activities;Manual lymph drainage;Energy conservation   skin care with low ph lotion   OT Home Exercise Plan  lymphatic pumping ther ex    Recommended Other Services  fit w/ ccl 2  knee length compression stockings and convoluted HOS devices to decrease fibrosis    Consulted and Agree with Plan of Care  Patient;Family member/caregiver    Family Member Consulted  spouse       Patient will benefit from skilled therapeutic intervention in order to improve the following deficits and impairments:     Visit Diagnosis: Lymphedema, not elsewhere classified    Problem List Patient Active Problem List   Diagnosis Date Noted  . GERD (gastroesophageal reflux disease) 12/07/2018  . Vitamin B12 deficiency 12/05/2018  . Senile purpura ([place]) 07/28/2018  . Chronic venous insufficiency 07/14/2018  . Lymphedema 07/14/2018  . Aneurysm of descending thoracic aorta (HCC) 05/04/2018  . Allergic rhinitis 02/16/2018  . Aortic atherosclerosis ([place]) 01/13/2018  . Status post placement of implantable loop recorder 01/12/2018  . Left-sided weakness 01/12/2018  . Chronic kidney disease, stage 3 ([place]) 01/12/2018  . Cerebrovascular accident (CVA) due to embolism of precerebral artery ([place])   . TIA (transient ischemic attack) 01/04/2018  . Osteoarthritis of wrist 12/09/2017  . Piriformis syndrome, left 11/25/2017  . CAD (coronary artery disease) 11/25/2017  . Caregiver stress 11/25/2017  . Menopause 08/14/2017  . Abnormal weight gain 08/14/2017  . De Quervain's tenosynovitis, right 07/17/2017  . Stress incontinence 06/19/2017  . Personal history of tobacco use, presenting hazards to health 04/01/2017  . Anxiety 03/13/2017  . Glossitis 11/14/2016  . Advanced care planning/counseling discussion 10/17/2016  . Other fecal abnormalities   . Benign neoplasm of sigmoid colon   . COPD (chronic obstructive pulmonary disease) ([place]) 10/17/2015  . Mass of upper inner quadrant of right breast 06/22/2015   Sharon Spearman, Sharon [name], Sharon [name], [facility] 12/29/18 12:02 PM    [place] MAIN [facility] SERVICES [address] [place], [name], [address] Phone:  [phone]   Fax:  [phone]  Name: MANDEE PLUTA MRN: [id number] Date of Birth: [birth year]

## 2018-12-30 ENCOUNTER — Ambulatory Visit: Payer: Medicare Other | Admitting: Occupational Therapy

## 2018-12-30 ENCOUNTER — Telehealth: Payer: Self-pay | Admitting: Nurse Practitioner

## 2018-12-30 NOTE — Telephone Encounter (Unsigned)
Copied from [place] [id number]. Topic: Medicare AWV >> Dec 30, 2018 12:43 PM Janace Hoard E wrote: Called to RESCHEDULE her Medicare Annual Wellness Visit with the Nurse Health Advisor. Patient wasn't at home, but I left a message with her husband [name], per DPR dated 03/31/2017, to please call Lattie Haw at [phone].  If patient returns call, please note: Her original AWV appointment is 01/20/2019 at 10:15 AM.   Please RESCHEDULE her AWV with [facility] any date available after 01/20/2019.  Thank you! For any questions please contact: Janace Hoard at [phone] or Skype [email or web address]

## 2018-12-31 ENCOUNTER — Encounter: Payer: Self-pay | Admitting: Licensed Clinical Social Worker

## 2018-12-31 ENCOUNTER — Other Ambulatory Visit: Payer: Self-pay

## 2018-12-31 ENCOUNTER — Telehealth: Payer: Self-pay

## 2018-12-31 ENCOUNTER — Ambulatory Visit (INDEPENDENT_AMBULATORY_CARE_PROVIDER_SITE_OTHER): Payer: Medicare Other | Admitting: Licensed Clinical Social Worker

## 2018-12-31 DIAGNOSIS — J449 Chronic obstructive pulmonary disease, unspecified: Secondary | ICD-10-CM

## 2018-12-31 DIAGNOSIS — Z636 Dependent relative needing care at home: Secondary | ICD-10-CM

## 2018-12-31 DIAGNOSIS — F419 Anxiety disorder, unspecified: Secondary | ICD-10-CM

## 2018-12-31 NOTE — Chronic Care Management (AMB) (Signed)
Chronic Care Management    Clinical Social Work Follow Up Note  12/31/2018 Name: Sharon [name] MRN: [id number] DOB: [birth year]  Sharon [name] is a 73 y.o. year old female who is a primary care patient of [name], [name], NP. The CCM team was consulted for assistance with [employer].   Review of patient status, including review of consultants reports, other relevant assessments, and collaboration with appropriate care team members and the patient's provider was performed as part of comprehensive patient evaluation and provision of chronic care management services.     Goals Addressed    . "I don't have a lot of education on resources available to me and my spouse" (pt-stated)       Current Barriers:  . Limited education about Caregiver Support Resources* . Limited access to caregiver . Inability to perform IADL's independently  Pt currently in OT related to lower extremity swelling and treatment of leg wound. Patient reports that she and spouse share and assist one another with performance of IADL's.   Clinical Social Work Clinical Goal(s):  Marland Kitchen Over the next 30 days, client will work with SW to address concerns related to lack of caregiver support within the home.  Interventions: . Patient interviewed and appropriate assessments performed . Discussed plans with patient for ongoing care management follow up and provided patient with direct contact information for care management team . Provided education to patient/caregiver regarding level of care options. including PACE and day programs which patient currently declines.   Patient Self Care Activities:  . Attends all scheduled provider appointments . Calls provider office for new concerns or questions  Plan:  . LCSW will follow up within 30 days and follow up on mailed community resources that were sent out on 12/21/2018  Please see past updates related to this goal by clicking on the "Past Updates" button in the selected goal      . "I want to know my options that are free of cost in terms of caregiving resources"       Current Barriers:  . ADL IADL limitations . Limited education about Caregiver resources free of charge and available to her and spouse* . Limited access to caregiver  Clinical Social Work Clinical Goal(s):  Marland Kitchen Over the next 90 days, client will work with SW to address concerns related to lack of support within the home and lack of planning in terms of long term care  Interventions: . Patient interviewed and appropriate assessments performed . Provided patient with information about caregiver support resources within the area as well as level of care  . Advised patient to check mail box for resources that was sent out to her by LCSW - UPDATE: Patient reports not receiving community resources in the mail yet but agrees to check her mailbox daily for these.  Marland Kitchen Provided education to patient/caregiver regarding level of care options.  Patient Self Care Activities:  . Attends all scheduled provider appointments . Calls provider office for new concerns or questions  Plan:  . Patient will check mailbox for mailed resources sent out by LCSW on 12/21/2018 . LCSW willfollow up with patient telephonically within 30 days  Please see past updates related to this goal by clicking on the "Past Updates" button in the selected goal        Follow Up Plan: SW will follow up with patient by phone over the next 30 days  [name], BSW, MSW, [place]  [email or web address] Phone: [phone]

## 2018-12-31 NOTE — Patient Instructions (Signed)
Licensed Clinical Social Worker Visit Information  Goals we discussed today:  Goals Addressed            This Visit's Progress   . "I don't have a lot of education on resources available to me and my spouse" (pt-stated)       Current Barriers:  . Limited education about Caregiver Support Resources* . Limited access to caregiver . Inability to perform IADL's independently  Pt currently in OT related to lower extremity swelling and treatment of leg wound. Patient reports that she and spouse share and assist one another with performance of IADL's.   Clinical Social Work Clinical Goal(s):  Marland Kitchen Over the next 30 days, client will work with SW to address concerns related to lack of caregiver support within the home.  Interventions: . Patient interviewed and appropriate assessments performed . Discussed plans with patient for ongoing care management follow up and provided patient with direct contact information for care management team . Provided education to patient/caregiver regarding level of care options. including PACE and day programs which patient currently declines.   Patient Self Care Activities:  . Attends all scheduled provider appointments . Calls provider office for new concerns or questions  Plan:  . LCSW will follow up within 30 days and follow up on mailed community resources that were sent out on 12/21/2018  Please see past updates related to this goal by clicking on the "Past Updates" button in the selected goal     . "I want to know my options that are free of cost in terms of caregiving resources"       Current Barriers:  . ADL IADL limitations . Limited education about Caregiver resources free of charge and available to her and spouse* . Limited access to caregiver  Clinical Social Work Clinical Goal(s):  Marland Kitchen Over the next 90 days, client will work with SW to address concerns related to lack of support within the home and lack of planning in terms of long term  care  Interventions: . Patient interviewed and appropriate assessments performed . Provided patient with information about caregiver support resources within the area as well as level of care  . Advised patient to check mail box for resources that was sent out to her by LCSW - UPDATE: Patient reports not receiving community resources in the mail yet but agrees to check her mailbox daily for these.  Marland Kitchen Provided education to patient/caregiver regarding level of care options.  Patient Self Care Activities:  . Attends all scheduled provider appointments . Calls provider office for new concerns or questions  Plan:  . Patient will check mailbox for mailed resources sent out by LCSW on 12/21/2018 . LCSW willfollow up with patient telephonically within 30 days  Please see past updates related to this goal by clicking on the "Past Updates" button in the selected goal          Materials Provided: Verbal education about caregiver support resources provided by phone  Follow Up Plan: Client will check mailbox for mailed resources that LCSW sent   [name], BSW, MSW, [email or web address] Phone: [phone]

## 2019-01-03 ENCOUNTER — Encounter: Payer: Self-pay | Admitting: Occupational Therapy

## 2019-01-03 ENCOUNTER — Other Ambulatory Visit: Payer: Self-pay

## 2019-01-03 ENCOUNTER — Encounter: Payer: Self-pay | Admitting: Licensed Clinical Social Worker

## 2019-01-03 ENCOUNTER — Ambulatory Visit (INDEPENDENT_AMBULATORY_CARE_PROVIDER_SITE_OTHER): Payer: Medicare Other | Admitting: *Deleted

## 2019-01-03 DIAGNOSIS — I639 Cerebral infarction, unspecified: Secondary | ICD-10-CM

## 2019-01-03 NOTE — Therapy (Signed)
Left VM for patient with update on Rehab clinic closing last week due to coronavirus, and encouraged Pt to call with any questions or concerns re lymphedema self-management during out follow-up visit interval in ~ 3-4 weeks. Pt instructed that support staff are checking voice mail daily and will convey concerns to therapists PRN. Left [facility] outpatient rehab phone number.   Andrey Spearman, MS, OTR/L, CLT-LANA

## 2019-01-04 LAB — CUP PACEART REMOTE DEVICE CHECK
Date Time Interrogation Session: 20200322174217
Implantable Pulse Generator Implant Date: 20190328

## 2019-01-07 ENCOUNTER — Telehealth: Payer: Self-pay

## 2019-01-07 NOTE — Telephone Encounter (Signed)
Virtual Visit Pre-Appointment Phone Call  Steps For Call:  1. Confirm consent - "In the setting of the current Covid19 crisis, you are scheduled for a TELEPHONE visit with your provider on 01/11/2019 at 1:40pm.  Just as we do with many in-office visits, in order for you to participate in this visit, we must obtain consent.  If you'd like, I can send this to your mychart (if signed up) or email for you to review.  Otherwise, I can obtain your verbal consent now.  All virtual visits are billed to your insurance company just like a normal visit would be.  By agreeing to a virtual visit, we'd like you to understand that the technology does not allow for your provider to perform an examination, and thus may limit your provider's ability to fully assess your condition.  Finally, though the technology is pretty good, we cannot assure that it will always work on either your or our end, and in the setting of a video visit, we may have to convert it to a phone-only visit.  In either situation, we cannot ensure that we have a secure connection.  Are you willing to proceed?"  2. Give patient instructions for WebEx download to smartphone as below if video visit  3. Advise patient to be prepared with any vital sign or heart rhythm information, their current medicines, and a piece of paper and pen handy for any instructions they may receive the day of their visit  4. Inform patient they will receive a phone call 15 minutes prior to their appointment time (may be from unknown caller ID) so they should be prepared to answer  5. Confirm that appointment type is correct in Epic appointment notes (video vs telephone)    TELEPHONE CALL NOTE  Sharon [name] has been deemed a candidate for a follow-up tele-health visit to limit community exposure during the Covid-19 pandemic. I spoke with the patient via phone to ensure availability of phone/video source, confirm preferred email & phone number, and discuss  instructions and expectations.  I reminded Sharon [name] to be prepared with any vital sign and/or heart rhythm information that could potentially be obtained via home monitoring, at the time of her visit. I reminded Sharon [name] to expect a phone call at the time of her visit if her visit.  Did the patient verbally acknowledge consent to treatment? YES   Sharon [name], [place] 01/07/2019 2:56 PM   DOWNLOADING THE [place], go to [employer] and type in WebEx in the search bar. [place] [employer], the blue/green circle. The app is free but as with any other app downloads, their phone may require them to verify saved payment information or Apple password. The patient does NOT have to create an account.  - If Android, ask patient to go to [employer] and type in WebEx in the search bar. [place] [employer], the blue/green circle. The app is free but as with any other app downloads, their phone may require them to verify saved payment information or Android password. The patient does NOT have to create an account.   CONSENT FOR TELE-HEALTH VISIT - PLEASE REVIEW  I hereby voluntarily request, consent and authorize [facility] and its employed or contracted physicians, physician assistants, nurse practitioners or other licensed health care professionals (the Practitioner), to provide me with telemedicine health care services (the "Services") as deemed necessary by the treating Practitioner. I  acknowledge and consent to receive the Services by the Practitioner via telemedicine. I understand that the telemedicine visit will involve communicating with the Practitioner through live audiovisual communication technology and the disclosure of certain medical information by electronic transmission. I acknowledge that I have been given the opportunity to request an in-person assessment or other available alternative prior to the telemedicine visit and  am voluntarily participating in the telemedicine visit.  I understand that I have the right to withhold or withdraw my consent to the use of telemedicine in the course of my care at any time, without affecting my right to future care or treatment, and that the Practitioner or I may terminate the telemedicine visit at any time. I understand that I have the right to inspect all information obtained and/or recorded in the course of the telemedicine visit and may receive copies of available information for a reasonable fee.  I understand that some of the potential risks of receiving the Services via telemedicine include:  Marland Kitchen Delay or interruption in medical evaluation due to technological equipment failure or disruption; . Information transmitted may not be sufficient (e.g. poor resolution of images) to allow for appropriate medical decision making by the Practitioner; and/or  . In rare instances, security protocols could fail, causing a breach of personal health information.  Furthermore, I acknowledge that it is my responsibility to provide information about my medical history, conditions and care that is complete and accurate to the best of my ability. I acknowledge that Practitioner's advice, recommendations, and/or decision may be based on factors not within their control, such as incomplete or inaccurate data provided by me or distortions of diagnostic images or specimens that may result from electronic transmissions. I understand that the practice of medicine is not an exact science and that Practitioner makes no warranties or guarantees regarding treatment outcomes. I acknowledge that I will receive a copy of this consent concurrently upon execution via email to the email address I last provided but may also request a printed copy by calling the office of [place].    I understand that my insurance will be billed for this visit.   I have read or had this consent read to me. . I understand the  contents of this consent, which adequately explains the benefits and risks of the Services being provided via telemedicine.  . I have been provided ample opportunity to ask questions regarding this consent and the Services and have had my questions answered to my satisfaction. . I give my informed consent for the services to be provided through the use of telemedicine in my medical care  By participating in this telemedicine visit I agree to the above.

## 2019-01-07 NOTE — Therapy (Signed)
[place] MAIN [facility] SERVICES [address] [place], [name], [address] Phone: [phone]   Fax:  [phone]  Occupational Therapy Treatment  Patient Details  Name: Sharon [name] MRN: [id number] Date of Birth: 11-03-1945 No data recorded  Encounter Date: 12/14/2018    Past Medical History:  Diagnosis Date  . Anxiety   . Arthritis    hands, upper back  . Asthma   . COPD (chronic obstructive pulmonary disease) ([place])   . History of cervical cancer   . Menopausal disorder   . Osteoporosis   . Pneumonia 1960  . Spasm of abdominal muscles of right side    intermittent  . TMJ (dislocation of temporomandibular joint)   . Wears dentures    partial lower    Past Surgical History:  Procedure Laterality Date  . ABDOMINAL HYSTERECTOMY  1970's  . bladder botox  2005  . BLADDER SUSPENSION  2004  . CATARACT EXTRACTION W/PHACO Right 03/09/2018   Procedure: CATARACT EXTRACTION PHACO AND INTRAOCULAR LENS PLACEMENT ([place]) right;  Surgeon: Sharon Bear, MD;  Location: [place];  Service: Ophthalmology;  Laterality: Right;  CALL CELL 1ST  . CATARACT EXTRACTION W/PHACO Left 04/19/2018   Procedure: CATARACT EXTRACTION PHACO AND INTRAOCULAR LENS PLACEMENT (IOC)  LEFT;  Surgeon: Sharon Bear, MD;  Location: [place];  Service: Ophthalmology;  Laterality: Left;  . COLONOSCOPY WITH PROPOFOL N/A 12/13/2015   Procedure: COLONOSCOPY WITH PROPOFOL;  Surgeon: Sharon Lame, MD;  Location: [place];  Service: Endoscopy;  Laterality: N/A;  . LOOP RECORDER INSERTION N/A 01/07/2018   Procedure: LOOP RECORDER INSERTION;  Surgeon: Sharon Sprang, MD;  Location: [place] [facility];  Service: Cardiovascular;  Laterality: N/A;  . POLYPECTOMY N/A 12/13/2015   Procedure: POLYPECTOMY;  Surgeon: Sharon Lame, MD;  Location: [place];  Service: Endoscopy;  Laterality: N/A;  SIGMOID COLON POLYPS X  5  . SHOULDER ARTHROSCOPY W/  ROTATOR CUFF REPAIR Right 1998  . TEE WITHOUT CARDIOVERSION N/A 01/06/2018   Procedure: TRANSESOPHAGEAL ECHOCARDIOGRAM (TEE);  Surgeon: Sharon Merritts, MD;  Location: [facility];  Service: Cardiovascular;  Laterality: N/A;  . TONSILLECTOMY AND ADENOIDECTOMY      There were no vitals filed for this visit.                                   OT Long Term Goals - 12/29/18 1146      OT LONG TERM GOAL #1   Title  Pt will demonstrate understanding of lymphedema (LE) precautions / prevention principals, including signs / symptoms of cellulitis infection with modified independence using LE Workbook as printed reference to identify 6 precautions without verbal cues by end of 3rd  OT Rx visit.     Baseline  Max A    Time  3    Period  Days    Status  Achieved      OT LONG TERM GOAL #2   Title  Pt will be able to apply knee-length, multi-layer, short stretch compression wraps using correct gradient techniques with moderate  caregiver assistance to achieve optimal limb volume reduction during Intensive Phase CDT, and to return affected limb(s) as closely as possible, to premorbid size and shape.    Baseline  Max A    Time  2    Period  Weeks    Status  Achieved  OT LONG TERM GOAL #3   Title  Pt to achieve at least  10% limb volume reduction in affected limb(s)  bilaterally during Intensive Phase CDT to control limb swelling, to improve tissue integrity and immune function, to improve ADLs performance and to improve functional mobility/ transfer, and to improve body image and self-esteem.    Baseline  dependent 09/23/2018 : Met for L leg and L thigh with segments with 13.24% and 14.02% reductions respectively. Full LLE leg volume goal nearly achieved with 9.03% volume reduction.    Time  12    Period  Weeks    Status  Achieved      OT LONG TERM GOAL #4   Title  Pt will achieve 100% compliance with daily LE self-[name] home program components, including daily  skin  [name], simple self-MLD, gradient compression wraps/ compression garments/devices, and therapeutic exercise with Mod CG support to ensure optimal Intensive Phase limb volume reduction to expedite compression garment/ device fitting.    Baseline  Pt 85% compliant with daytime compression as instructed. She wears stockings daily, just does not always don stockings first thing in the morning due to caregiver demands of spouse. Pt appreciates ongoing encouragement and valdation of progress achieved thus far.    Time  12    Period  Weeks    Status  Partially Met      OT LONG TERM GOAL #5   Title  Pt will use appropriate  assistive devices during LE self-[name] training to don compression garments/devices with modified independence to ensure optimal LE self-management over time and to limit progression of chronic LE.    Baseline  Max A    Time  12    Period  Weeks    Status  Achieved      OT LONG TERM GOAL #6   Title  Pt will retain optimal limb volume reductions achieved during Intensive Phase CDT with no more than 3% volume increase with ongoing CG assistance to limit LE progression and further functional decline.    Baseline  Max A    Time  6    Period  Months    Status  On-going            Plan - 01/07/19 1321    Clinical Impression Statement  Sharon [name] continues to present with mild BLE redness, 1+ pitting and ankle and distal leg swelling this morning due to being up and active in gravity dependent position for several hours prior to OT session without compression garments in place prior to session. Pt reiterates that she utilizes compression daily as instructed, just has difficulty getting them on  when she rises in the morning due to caregiver demands for spouse with chronic illness. After skilled edu Pt verbalized understanding that without high degree of compliance with daily compression  garments  prognosis for retaining clinical gains achieved in OT is poor.  She verbalizes gratitude  for encouragement and validation of progress towards all goals thus far. Pt tolerated MLD without increased pain/ discomfort today. Pt denies tenderness during MLD to RLE/RLQ  today. She donned compression garments using assistive devices and extra time without any therapist assistance today.  Pt has achieved volumetric reduction goals bilaterally; however, although measurements have decreased over more than six weeks of twice weekly OT for Complete Decongestive Therapy (CDT)  , a sequential, pneumatic compression device remains medically necessary for long term in home lymphedema self-management to limit progression of dense distal fibrosis to hyperkeratosis and  poor joint flexibility resulting further functional decline.  Pt agrees to follow up visit in 4-6 weeks.      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   Rehab Potential  Good    Clinical Decision Making  Several treatment options, min-mod task modification necessary    OT Frequency  3x / week    OT Duration  12 weeks    OT Treatment/Interventions  Self-[name]/ADL training;Therapeutic exercise;DME and/or AE instruction;Therapist, nutritional;Compression bandaging;Balance training;Other (comment);Manual Therapy;Coping strategies training;Patient/family education;Therapeutic activities;Manual lymph drainage;Energy conservation   skin [name] with low ph lotion   OT Home Exercise Plan  lymphatic pumping ther ex    Recommended Other Services  fit w/ ccl 2 knee length compression stockings and convoluted HOS devices to decrease fibrosis    Consulted and Agree with Plan of [name]  Patient;Family member/caregiver    Family Member Consulted  spouse       Patient will benefit from skilled therapeutic intervention in order to improve the following deficits and impairments:     Visit  Diagnosis: Lymphedema, not elsewhere classified    Problem List Patient Active Problem List   Diagnosis Date Noted  . GERD (gastroesophageal reflux disease) 12/07/2018  . Vitamin B12 deficiency 12/05/2018  . Senile purpura ([place]) 07/28/2018  . Chronic venous insufficiency 07/14/2018  . Lymphedema 07/14/2018  . Aneurysm of descending thoracic aorta (HCC) 05/04/2018  . Allergic rhinitis 02/16/2018  . Aortic atherosclerosis ([place]) 01/13/2018  . Status post placement of implantable loop recorder 01/12/2018  . Left-sided weakness 01/12/2018  . Chronic kidney disease, stage 3 ([place]) 01/12/2018  . Cerebrovascular accident (CVA) due to embolism of precerebral artery ([place])   . TIA (transient ischemic attack) 01/04/2018  . Osteoarthritis of wrist 12/09/2017  . Piriformis syndrome, left 11/25/2017  . CAD (coronary artery disease) 11/25/2017  . Caregiver stress 11/25/2017  . Menopause 08/14/2017  . Abnormal weight gain 08/14/2017  . De Quervain's tenosynovitis, right 07/17/2017  . Stress incontinence 06/19/2017  . Personal history of tobacco use, presenting hazards to health 04/01/2017  . Anxiety 03/13/2017  . Glossitis 11/14/2016  . Advanced [name] planning/counseling discussion 10/17/2016  . Other fecal abnormalities   . Benign neoplasm of sigmoid colon   . COPD (chronic obstructive pulmonary disease) ([place]) 10/17/2015  . Mass of upper inner quadrant of right breast 06/22/2015    Ansel Bong 01/07/2019, 1:22 PM  [place] MAIN [facility] SERVICES [address] [place], [name], [address] Phone: [phone]   Fax:  [phone]  Name: Sharon [name] MRN: [id number] Date of Birth: May 26, 1946

## 2019-01-10 NOTE — Progress Notes (Addendum)
Virtual Visit via Video Note   This visit type was conducted due to national recommendations for restrictions regarding the COVID-19 Pandemic (e.g. social distancing) in an effort to limit this patient's exposure and mitigate transmission in our community.  Due to her co-morbid illnesses, this patient is at least at moderate risk for complications without adequate follow up.  This format is felt to be most appropriate for this patient at this time.  All issues noted in this document were discussed and addressed.  A limited physical exam was performed with this format.  Please refer to the patient's chart for her consent to telehealth for [facility].    Date:  01/10/2019   ID:  Sharon, [name] [birth year], MRN [id number]  Patient Location:  [place] [address]   Provider location:   [facility], [place] office  PCP:  [name], NP  Cardiologist:  No primary care provider on file.   Chief Complaint: Severe bruising, leg swelling   History of Present Illness:    Sharon [name] is a 73 y.o. female who presents via audio/video conferencing for a telehealth visit today.   The patient does not symptoms concerning for COVID-19 infection (fever, chills, cough, or new SHORTNESS OF BREATH).  Please refer to prior office visit for complete details: Patient has a past medical history of  Mikayla Chiusano Masseyis a 73 y.o.femalewith a hx of  asthma/COPD,  cervical cancer,  Anxiety embolic stroke January 04, 2018 Transesophageal echo documenting no left atrial appendage or thrombus Placement of reveal/loop device January 07, 2018 Presents for discussion of history of stroke,   emergency department January 04, 2018  slurred speech and weakness involving the left hand.  facial droop in the morning, though this had resolved by the time that she presented to the emergency department.  difficulty with balance.    multiple right cerebral infarcts concerning  for embolic phenomenon.  Long discussion concerning her issues today Loop monitor in place, some irritation around the site Lots of bruising on her skin, the aspirin every other day with Plavix daily Wonders if she might have an allergy to Plavix, sometimes with spots Hoarse, after she talks for periods of time throat is very dry  Taking a PPI, , previously seen by Dr. Allen Norris, several months ago had GERD issues, better now Weight trending up, not know what to do, like sweet potatoes Itching, unclear etiology Having lymphedema treatments at the hospital, been measured for the lymph pump  Had a loop, no significant arrhythmia  Total chol 158, LDL 95  Prior CV studies:   The following studies were reviewed today:  Echo  09/2018 - Left ventricle: The cavity size was normal. There was mild   concentric hypertrophy. Systolic function was normal. The   estimated ejection fraction was in the range of 55% to 60%. Wall   motion was normal; there were no regional wall motion   abnormalities. Doppler parameters are consistent with abnormal   left ventricular relaxation (grade 1 diastolic dysfunction). - Pulmonary arteries: Systolic pressure was at the upper limits of   normal. - Pericardium, extracardiac: A small pericardial effusion was   identified posterior to the heart.   Past Medical History:  Diagnosis Date  . Anxiety   . Arthritis    hands, upper back  . Asthma   . COPD (chronic obstructive pulmonary disease) ([place])   . History of cervical cancer   . Menopausal disorder   .  Osteoporosis   . Pneumonia 1960  . Spasm of abdominal muscles of right side    intermittent  . TMJ (dislocation of temporomandibular joint)   . Wears dentures    partial lower   Past Surgical History:  Procedure Laterality Date  . ABDOMINAL HYSTERECTOMY  1970's  . bladder botox  2005  . BLADDER SUSPENSION  2004  . CATARACT EXTRACTION W/PHACO Right 03/09/2018   Procedure: CATARACT EXTRACTION PHACO  AND INTRAOCULAR LENS PLACEMENT ([place]) right;  Surgeon: Eulogio Bear, MD;  Location: [place];  Service: Ophthalmology;  Laterality: Right;  CALL CELL 1ST  . CATARACT EXTRACTION W/PHACO Left 04/19/2018   Procedure: CATARACT EXTRACTION PHACO AND INTRAOCULAR LENS PLACEMENT (IOC)  LEFT;  Surgeon: Eulogio Bear, MD;  Location: [place];  Service: Ophthalmology;  Laterality: Left;  . COLONOSCOPY WITH PROPOFOL N/A 12/13/2015   Procedure: COLONOSCOPY WITH PROPOFOL;  Surgeon: Lucilla Lame, MD;  Location: [place];  Service: Endoscopy;  Laterality: N/A;  . LOOP RECORDER INSERTION N/A 01/07/2018   Procedure: LOOP RECORDER INSERTION;  Surgeon: Deboraha Sprang, MD;  Location: [place] [facility];  Service: Cardiovascular;  Laterality: N/A;  . POLYPECTOMY N/A 12/13/2015   Procedure: POLYPECTOMY;  Surgeon: Lucilla Lame, MD;  Location: [place];  Service: Endoscopy;  Laterality: N/A;  SIGMOID COLON POLYPS X  5  . SHOULDER ARTHROSCOPY W/ ROTATOR CUFF REPAIR Right 1998  . TEE WITHOUT CARDIOVERSION N/A 01/06/2018   Procedure: TRANSESOPHAGEAL ECHOCARDIOGRAM (TEE);  Surgeon: Minna Merritts, MD;  Location: [facility];  Service: Cardiovascular;  Laterality: N/A;  . TONSILLECTOMY AND ADENOIDECTOMY       No outpatient medications have been marked as taking for the 01/11/19 encounter (Appointment) with Minna Merritts, MD.   Current Facility-Administered Medications for the 01/11/19 encounter (Appointment) with Minna Merritts, MD  Medication  . cyanocobalamin ((VITAMIN B-12)) injection 1,000 mcg     Allergies:   Baclofen; Librium [chlordiazepoxide]; Bactrim [sulfamethoxazole-trimethoprim]; Ibuprofen; Latex; Naprosyn [naproxen]; and Other   Social History   Tobacco Use  . Smoking status: Former Smoker    Packs/day: 0.60    Years: 56.00    Pack years: 33.60    Types: Cigarettes    Last attempt to quit: 07/14/2016    Years since quitting: 2.4  . Smokeless tobacco:  Never Used  . Tobacco comment: has smoked off and on  Substance Use Topics  . Alcohol use: Yes    Alcohol/week: 0.0 standard drinks    Comment: occasionally - special occasions  . Drug use: No     Current Outpatient Medications on File Prior to Visit  Medication Sig Dispense Refill  . albuterol (PROAIR HFA) 108 (90 Base) MCG/ACT inhaler Inhale 2 puffs into the lungs every 6 (six) hours as needed for wheezing or shortness of breath. 8.5 Inhaler 12  . aspirin EC 81 MG tablet Take 81 mg by mouth every other day.     Marland Kitchen atorvastatin (LIPITOR) 40 MG tablet TAKE 1 TABLET BY MOUTH ONCE A DAY AT 6 PM 30 tablet 6  . b complex vitamins capsule Take 1 capsule by mouth daily.    . citalopram (CELEXA) 20 MG tablet Take 1 tablet (20 mg total) by mouth daily. (Patient taking differently: Take 20 mg by mouth daily. PRN) 30 tablet 3  . clopidogrel (PLAVIX) 75 MG tablet Take 1 tablet (75 mg total) by mouth daily. 30 tablet 6  . Cyanocobalamin 1000 MCG/ML KIT Inject 1,000 mcg as directed every  30 (thirty) days.    . Emollient (EUCERIN) lotion Apply topically as needed for dry skin.    Marland Kitchen estradiol (VIVELLE-DOT) 0.1 MG/24HR patch APPLY 1 PATCH ONTO THE SKIN 2 TIMES A WEEK 8 patch 11  . guaiFENesin (MUCINEX) 600 MG 12 hr tablet Take 1 tablet (600 mg total) by mouth 2 (two) times daily. 30 tablet 0  . loratadine (CLARITIN) 10 MG tablet Take 10 mg by mouth daily.    . Multiple Vitamin (MULTIVITAMIN) tablet Take 2 tablets by mouth daily.     . pantoprazole (PROTONIX) 40 MG tablet Take 1 tablet (40 mg total) by mouth daily. 90 tablet 3  . SYMBICORT 160-4.5 MCG/ACT inhaler USE 2 PUFFS TWICE DAILY 10.2 Inhaler 11  . Tiotropium Bromide Monohydrate (SPIRIVA RESPIMAT) 2.5 MCG/ACT AERS Inhale 2 puffs into the lungs daily. (Patient not taking: Reported on 12/07/2018) 1 Inhaler 12   Current Facility-Administered Medications on File Prior to Visit  Medication Dose Route Frequency Provider Last Rate Last Dose  .  cyanocobalamin ((VITAMIN B-12)) injection 1,000 mcg  1,000 mcg Intramuscular Q30 days Kathrine Haddock, NP   1,000 mcg at 12/21/18 1314     Family Hx: The patient's family history includes Diabetes in her mother; Heart disease in her mother; Stroke in her maternal grandmother and mother.  ROS:   Please see the history of present illness.    Review of Systems  Constitutional: Negative.        Hoarse voice  Respiratory: Negative.   Cardiovascular: Positive for leg swelling.  Gastrointestinal: Negative.   Musculoskeletal: Negative.   Skin: Positive for rash.       Significant bruising  Neurological: Negative.   Psychiatric/Behavioral: Negative.   All other systems reviewed and are negative.     Labs/Other Tests and Data Reviewed:    Recent Labs: 09/08/2018: ALT 22; B Natriuretic Peptide 51.0; BUN 14; Creatinine, Ser 0.88; Hemoglobin 13.7; Platelets 219; Potassium 4.3; Sodium 142; TSH 1.526   Recent Lipid Panel Lab Results  Component Value Date/Time   CHOL 158 01/08/2018 04:06 AM   CHOL 191 11/25/2017 10:38 AM   TRIG 45 01/08/2018 04:06 AM   HDL 54 01/08/2018 04:06 AM   HDL 63 11/25/2017 10:38 AM   CHOLHDL 2.9 01/08/2018 04:06 AM   LDLCALC 95 01/08/2018 04:06 AM   LDLCALC 109 (H) 11/25/2017 10:38 AM    Wt Readings from Last 3 Encounters:  12/07/18 214 lb 6 oz (97.2 kg)  10/28/18 211 lb (95.7 kg)  10/19/18 216 lb 9.6 oz (98.2 kg)     Exam:    Vital Signs:  LMP  (LMP Unknown)    Well nourished, well developed female in no acute distress.   ASSESSMENT & PLAN:    Aortic atherosclerosis (HCC) On aspirin Plavix with significant bruising Prior stroke history No recent lipid panel available   cerebrovascular accident (CVA), unspecified mechanism ([place]) Long discussion, loop monitor downloads showing no significant arrhythmia Taking aspirin every other day, Plavix daily still with severe bruising, bothersome to her Suggested she could try to hold the aspirin take  Plavix alone  Shortness of breath Recommended regular walking program Weight is trending upwards, desserts, sweet potato Discussed some dietary changes  Chronic obstructive pulmonary disease, unspecified COPD type ([place]) Denies significant change in her breathing  Atherosclerosis of native coronary artery of native heart without angina pectoris Currently with no symptoms of angina. No further workup at this time. Continue current medication regimen.  Aneurysm of descending thoracic aorta (HCC) We  will need periodic surveillance  Lymphedema Scheduled to have lymphedema compression pumps made Has been having treatments in the hospital   COVID-19 Education: The signs and symptoms of COVID-19 were discussed with the patient and how to seek care for testing (follow up with PCP or arrange E-visit).  The importance of social distancing was discussed today.  Patient Risk:   After full review of this patients clinical status, I feel that they are at least moderate risk at this time.  Time:   Today, I have spent 25 minutes with the patient with telehealth technology discussing lymphedema, significant bruising, risk of stroke, details of her monitor.     Medication Adjustments/Labs and Tests Ordered: Current medicines are reviewed at length with the patient today.  Concerns regarding medicines are outlined above.   Tests Ordered: No tests ordered   Medication Changes: No changes made   Disposition: Follow-up in 6 months   Signed, Ida Rogue, MD  01/10/2019 5:51 PM    [place] Office [address] [place] [address], [place], [place] [address]

## 2019-01-11 ENCOUNTER — Telehealth (INDEPENDENT_AMBULATORY_CARE_PROVIDER_SITE_OTHER): Payer: Medicare Other | Admitting: Cardiovascular Disease

## 2019-01-11 ENCOUNTER — Encounter: Payer: Self-pay | Admitting: Cardiovascular Disease

## 2019-01-11 ENCOUNTER — Other Ambulatory Visit: Payer: Self-pay

## 2019-01-11 VITALS — BP 109/66 | HR 84 | Ht 69.0 in | Wt 209.0 lb

## 2019-01-11 DIAGNOSIS — I712 Thoracic aortic aneurysm, without rupture: Secondary | ICD-10-CM

## 2019-01-11 DIAGNOSIS — J449 Chronic obstructive pulmonary disease, unspecified: Secondary | ICD-10-CM

## 2019-01-11 DIAGNOSIS — I7123 Aneurysm of the descending thoracic aorta, without rupture: Secondary | ICD-10-CM

## 2019-01-11 DIAGNOSIS — R0602 Shortness of breath: Secondary | ICD-10-CM

## 2019-01-11 DIAGNOSIS — I700 Atherosclerosis of aorta: Secondary | ICD-10-CM | POA: Diagnosis not present

## 2019-01-11 DIAGNOSIS — I2510 Atherosclerotic heart disease of native coronary artery without angina pectoris: Secondary | ICD-10-CM

## 2019-01-11 DIAGNOSIS — I639 Cerebral infarction, unspecified: Secondary | ICD-10-CM

## 2019-01-11 NOTE — Patient Instructions (Signed)

## 2019-01-12 ENCOUNTER — Telehealth: Payer: Self-pay | Admitting: Nurse Practitioner

## 2019-01-12 NOTE — Progress Notes (Signed)
Carelink Summary Report / Loop Recorder 

## 2019-01-12 NOTE — Telephone Encounter (Signed)
Copied from [place] [id number]. Topic: General - Other >> Jan 12, 2019  4:19 PM Keene Breath wrote: Reason for CRM: Patient called to ask how she should schedule her B12 shot that she regularly comes in to get.  She stated that she did not want to come into the office, but wanted to know how she should get her shot.  Please advise and call patient back at [phone]

## 2019-01-18 ENCOUNTER — Ambulatory Visit: Admitting: Cardiovascular Disease

## 2019-01-20 ENCOUNTER — Telehealth: Payer: Self-pay

## 2019-01-20 ENCOUNTER — Ambulatory Visit: Payer: Medicare Other

## 2019-01-20 ENCOUNTER — Other Ambulatory Visit: Payer: Self-pay

## 2019-01-20 ENCOUNTER — Ambulatory Visit (INDEPENDENT_AMBULATORY_CARE_PROVIDER_SITE_OTHER): Payer: Medicare Other

## 2019-01-20 DIAGNOSIS — K140 Glossitis: Secondary | ICD-10-CM | POA: Diagnosis not present

## 2019-01-20 DIAGNOSIS — E538 Deficiency of other specified B group vitamins: Secondary | ICD-10-CM

## 2019-01-20 NOTE — Telephone Encounter (Signed)
Confirmed Appointment with [facility]  E-visit with Dr. Caryl Comes 02/01/2019 11:00. (Consent given 01/07/2019)

## 2019-01-24 DIAGNOSIS — I890 Lymphedema, not elsewhere classified: Secondary | ICD-10-CM | POA: Diagnosis not present

## 2019-01-31 ENCOUNTER — Ambulatory Visit: Payer: Self-pay | Admitting: Licensed Clinical Social Worker

## 2019-01-31 ENCOUNTER — Encounter: Payer: Self-pay | Admitting: Occupational Therapy

## 2019-01-31 ENCOUNTER — Telehealth: Payer: Self-pay

## 2019-01-31 NOTE — Chronic Care Management (AMB) (Signed)
  Care Management Note   Sharon [name] is a 73 y.o. year old female who is a primary care patient of [name], [name], NP. The CM team was consulted for assistance with chronic disease management and care coordination.   I reached out to [name] by phone today but was unable to reach her successfully. HIPPA compliant voice message left encouraging a return call once available.  Review of patient status, including review of consultants reports, relevant laboratory and other test results, and collaboration with appropriate care team members and the patient's provider was performed as part of comprehensive patient evaluation and provision of chronic care management services.   Follow Up Plan: The CM team will reach out to the patient again over the next 14-28 days.   [name], BSW, MSW, [facility] Care Management [name] Phone: [phone]

## 2019-01-31 NOTE — Therapy (Signed)
Spoke with Sharon [name] by phone today in effort to provide follow along support for BLE/BLQ lymphedema management and LE self-care. Pt reports basic, knee length, sequential pneumatic device arrived last week, but she has not tried it due to allowing time before opening parcel to limit risk of coronavirus exposure. Pt reports leg swelling is well controlled  She reports high level of compliance with daily LE elf-care home program,  including simple self MLD, skin care and recommended compression garments, alternating between thigh and knee length, ccl 2 garments. Pt reports leg pain is reduced from 7/10 to 4/10 with ongoing daily self care. Sharon [name] agrees to call OT PRN should questions and/or concerns related to lymphedema or related infection as needed. We agree to follow up appointment in person as soon as COVID-19 restrictions are relaxed/ lifted.She agrees to report any signs/ symptoms of infection, aka cellulitis, to her PCP.   Andrey Spearman, MS, OTR/L, CLT-LANA 01/31/19 2:35 PM

## 2019-02-01 ENCOUNTER — Telehealth (INDEPENDENT_AMBULATORY_CARE_PROVIDER_SITE_OTHER): Payer: Medicare Other | Admitting: Internal Medicine

## 2019-02-01 ENCOUNTER — Other Ambulatory Visit: Payer: Self-pay

## 2019-02-01 ENCOUNTER — Encounter: Payer: Self-pay | Admitting: Internal Medicine

## 2019-02-01 VITALS — Ht 69.0 in | Wt 209.0 lb

## 2019-02-01 DIAGNOSIS — R233 Spontaneous ecchymoses: Secondary | ICD-10-CM

## 2019-02-01 DIAGNOSIS — I6310 Cerebral infarction due to embolism of unspecified precerebral artery: Secondary | ICD-10-CM | POA: Diagnosis not present

## 2019-02-01 DIAGNOSIS — R499 Unspecified voice and resonance disorder: Secondary | ICD-10-CM

## 2019-02-01 DIAGNOSIS — I639 Cerebral infarction, unspecified: Secondary | ICD-10-CM

## 2019-02-01 DIAGNOSIS — Z959 Presence of cardiac and vascular implant and graft, unspecified: Secondary | ICD-10-CM

## 2019-02-01 NOTE — Progress Notes (Signed)
Electrophysiology TeleHealth Note   Due to national recommendations of social distancing due to COVID 19, an audio/video telehealth visit is felt to be most appropriate for this patient at this time.  See MyChart message from today for the patient's consent to telehealth for [facility].   Date:  02/01/2019   ID:  Sharon [name], DOB Jan 14, 1946, MRN [id number]  Location: patient's home  Provider location: [address], [name]  Evaluation Performed: Follow-up visit  PCP:  [name], NP  Cardiologist:     Electrophysiologist:  SK   Chief Complaint:. Cryptogenic Stroke   History of Present Illness:    Sharon [name] is a 73 y.o. female who presents via audio/video conferencing for a telehealth visit today.  Since last being seen the hospital at which time she underwent loop recorder insertion 3/19,, the patient reports no residual neurological problems.    No dyspnea chest pain or edema.  Easy bruisability as well as recurrent purpuric-like lesions which are painful to touch.  Has also noted a change in her voice and a 25 pound weight gain over the last year.  GI evaluation was unrevealing  Date Cr K TSH  11/19 0.88 4.3 1/526           The patient denies symptoms of fevers, chills, cough, or new SOB worrisome for COVID 19   Past Medical History:  Diagnosis Date  . Anxiety   . Arthritis    hands, upper back  . Asthma   . COPD (chronic obstructive pulmonary disease) ([place])   . History of cervical cancer   . Menopausal disorder   . Osteoporosis   . Pneumonia 1960  . Spasm of abdominal muscles of right side    intermittent  . TMJ (dislocation of temporomandibular joint)   . Wears dentures    partial lower    Past Surgical History:  Procedure Laterality Date  . ABDOMINAL HYSTERECTOMY  1970's  . bladder botox  2005  . BLADDER SUSPENSION  2004  . CATARACT EXTRACTION W/PHACO Right 03/09/2018   Procedure: CATARACT EXTRACTION PHACO AND  INTRAOCULAR LENS PLACEMENT ([place]) right;  Surgeon: Eulogio Bear, MD;  Location: [place];  Service: Ophthalmology;  Laterality: Right;  CALL CELL 1ST  . CATARACT EXTRACTION W/PHACO Left 04/19/2018   Procedure: CATARACT EXTRACTION PHACO AND INTRAOCULAR LENS PLACEMENT ([facility])  LEFT;  Surgeon: Eulogio Bear, MD;  Location: [place];  Service: Ophthalmology;  Laterality: Left;  . COLONOSCOPY WITH PROPOFOL N/A 12/13/2015   Procedure: COLONOSCOPY WITH PROPOFOL;  Surgeon: Lucilla Lame, MD;  Location: [place];  Service: Endoscopy;  Laterality: N/A;  . LOOP RECORDER INSERTION N/A 01/07/2018   Procedure: LOOP RECORDER INSERTION;  Surgeon: Deboraha Sprang, MD;  Location: [place] [facility];  Service: Cardiovascular;  Laterality: N/A;  . POLYPECTOMY N/A 12/13/2015   Procedure: POLYPECTOMY;  Surgeon: Lucilla Lame, MD;  Location: [place];  Service: Endoscopy;  Laterality: N/A;  SIGMOID COLON POLYPS X  5  . SHOULDER ARTHROSCOPY W/ ROTATOR CUFF REPAIR Right 1998  . TEE WITHOUT CARDIOVERSION N/A 01/06/2018   Procedure: TRANSESOPHAGEAL ECHOCARDIOGRAM (TEE);  Surgeon: Minna Merritts, MD;  Location: [facility];  Service: Cardiovascular;  Laterality: N/A;  . TONSILLECTOMY AND ADENOIDECTOMY      Current Outpatient Medications  Medication Sig Dispense Refill  . albuterol (PROAIR HFA) 108 (90 Base) MCG/ACT inhaler Inhale 2 puffs into the lungs every 6 (six) hours as needed for  wheezing or shortness of breath. 8.5 Inhaler 12  . atorvastatin (LIPITOR) 40 MG tablet TAKE 1 TABLET BY MOUTH ONCE A DAY AT 6 PM 30 tablet 6  . clopidogrel (PLAVIX) 75 MG tablet Take 1 tablet (75 mg total) by mouth daily. 30 tablet 6  . Cyanocobalamin 1000 MCG/ML KIT Inject 1,000 mcg as directed every 30 (thirty) days.    Marland Kitchen estradiol (VIVELLE-DOT) 0.1 MG/24HR patch APPLY 1 PATCH ONTO THE SKIN 2 TIMES A WEEK 8 patch 11  . guaiFENesin (MUCINEX) 600 MG 12 hr tablet Take 1 tablet (600 mg total) by mouth  2 (two) times daily. 30 tablet 0  . loratadine (CLARITIN) 10 MG tablet Take 10 mg by mouth daily.    . pantoprazole (PROTONIX) 40 MG tablet Take 1 tablet (40 mg total) by mouth daily. 90 tablet 3  . SYMBICORT 160-4.5 MCG/ACT inhaler USE 2 PUFFS TWICE DAILY 10.2 Inhaler 11  . b complex vitamins capsule Take 1 capsule by mouth daily.    . citalopram (CELEXA) 20 MG tablet Take 1 tablet (20 mg total) by mouth daily. (Patient not taking: Reported on 02/01/2019) 30 tablet 3  . Emollient (EUCERIN) lotion Apply topically as needed for dry skin.    . Multiple Vitamin (MULTIVITAMIN) tablet Take 2 tablets by mouth daily.     . Tiotropium Bromide Monohydrate (SPIRIVA RESPIMAT) 2.5 MCG/ACT AERS Inhale 2 puffs into the lungs daily. (Patient not taking: Reported on 02/01/2019) 1 Inhaler 12   Current Facility-Administered Medications  Medication Dose Route Frequency Provider Last Rate Last Dose  . cyanocobalamin ((VITAMIN B-12)) injection 1,000 mcg  1,000 mcg Intramuscular Q30 days Kathrine Haddock, NP   1,000 mcg at 01/20/19 2080    Allergies:   Baclofen; Librium [chlordiazepoxide]; Bactrim [sulfamethoxazole-trimethoprim]; Ibuprofen; Latex; Naprosyn [naproxen]; and Other   Social History:  The patient  reports that she quit smoking about 2 years ago. Her smoking use included cigarettes. She has a 33.60 pack-year smoking history. She has never used smokeless tobacco. She reports current alcohol use. She reports that she does not use drugs.   Family History:  The patient's   family history includes Diabetes in her mother; Heart disease in her mother; Stroke in her maternal grandmother and mother.   ROS:  Please see the history of present illness.   All other systems are personally reviewed and negative.    Exam:    Vital Signs:  Ht _0  (1.753 m)   Wt 209 lb (94.8 kg)   LMP  (LMP Unknown)   BMI 30.86 kg/m     Well appearing, alert and conversant, regular work of breathing,  good skin color Eyes-  anicteric, neuro- grossly intact, skin- no apparent rash or lesions or cyanosis, mouth- oral mucosa is pink Device pocket well healed; without hematoma or erythema.  There is no tethering   Labs/Other Tests and Data Reviewed:    Recent Labs: 09/08/2018: ALT 22; B Natriuretic Peptide 51.0; BUN 14; Creatinine, Ser 0.88; Hemoglobin 13.7; Platelets 219; Potassium 4.3; Sodium 142; TSH 1.526   Wt Readings from Last 3 Encounters:  02/01/19 209 lb (94.8 kg)  01/11/19 209 lb (94.8 kg)  12/07/18 214 lb 6 oz (97.2 kg)     Other studies personally reviewed: Additional studies/ records that were reviewed today include:  Hospital records   Last device remote is reviewed from [place] PDF dated 3/2 which reveals normal device function,   arrhythmias - none     ASSESSMENT & PLAN:   Cryptogenic  Stroke   Implantable loop recorder  Weight Gain  Voice changes  Recurring painful purpura  No interval arrhythmias.  Continue Plavix.  We will see her as needed and continue to monitor her regularly  Have suggested that she see ENT/voice physician regarding her change in voice.  With her weight gain I wonder whether there is a metabolic issue; thyroid were normal but cortisol has not been tested I also have to look up to see whether acromegaly can do this  Have suggested that she take pictures of her purpuric lesions and establish with dermatology  COVID 19 screen The patient denies symptoms of COVID 19 at this time.  The importance of social distancing was discussed today.  Follow-up:  Prn  Next remote: As Scheduled   Current medicines are reviewed at length with the patient today.   The patient does not have concerns regarding her medicines.  The following changes were made today:  none  Labs/ tests ordered today include:   No orders of the defined types were placed in this encounter.   Future tests ( post COVID )     Patient Risk:  after full review of this patients clinical status, I  feel that they are at moderate risk at this time.  Today, I have spent  12 minutes with the patient with telehealth technology discussing the above.  Signed, Virl Axe, MD  02/01/2019 11:18 AM     [facility] [address] [place] [address] [phone] (office) [phone] (fax)

## 2019-02-01 NOTE — Patient Instructions (Signed)
Medication Instructions:  -Your physician recommends that you continue on your current medications as directed. Please refer to the Current Medication list given to you today.  If you need a refill on your cardiac medications before your next appointment, please call your pharmacy.   Lab work: - none ordered  If you have labs (blood work) drawn today and your tests are completely normal, you will receive your results only by: Marland Kitchen MyChart Message (if you have MyChart) OR . A paper copy in the mail If you have any lab test that is abnormal or we need to change your treatment, we will call you to review the results.  Testing/Procedures: - none ordered  Follow-Up: At [facility], you and your health needs are our priority.  As part of our continuing mission to provide you with exceptional heart care, we have created designated Provider Care Teams.  These Care Teams include your primary Cardiologist (physician) and Advanced Practice Providers (APPs -  Physician Assistants and Nurse Practitioners) who all work together to provide you with the care you need, when you need it. Marland Kitchen as needed with Dr. Caryl Comes (we will continue to monitor your loop recorder remotely)  Any Other Special Instructions Will Be Listed Below (If Applicable). - it is recommended that you follow up with an ENT (ear, nose, & throat doctor) regarding your change in voice - it is recommended that you follow up with dermatology regarding skin lesions

## 2019-02-04 ENCOUNTER — Other Ambulatory Visit: Payer: Self-pay

## 2019-02-04 ENCOUNTER — Ambulatory Visit (INDEPENDENT_AMBULATORY_CARE_PROVIDER_SITE_OTHER): Payer: Medicare Other | Admitting: *Deleted

## 2019-02-04 DIAGNOSIS — I6310 Cerebral infarction due to embolism of unspecified precerebral artery: Secondary | ICD-10-CM | POA: Diagnosis not present

## 2019-02-04 LAB — CUP PACEART REMOTE DEVICE CHECK
Date Time Interrogation Session: 20200424174144
Implantable Pulse Generator Implant Date: 20190328

## 2019-02-11 NOTE — Progress Notes (Signed)
Carelink Summary Report / Loop Recorder 

## 2019-02-14 ENCOUNTER — Other Ambulatory Visit: Payer: Self-pay | Admitting: Nurse Practitioner

## 2019-02-14 ENCOUNTER — Ambulatory Visit: Payer: Self-pay | Admitting: Licensed Clinical Social Worker

## 2019-02-14 ENCOUNTER — Other Ambulatory Visit: Payer: Self-pay

## 2019-02-14 DIAGNOSIS — Z636 Dependent relative needing care at home: Secondary | ICD-10-CM

## 2019-02-14 DIAGNOSIS — F419 Anxiety disorder, unspecified: Secondary | ICD-10-CM

## 2019-02-14 MED ORDER — DOXYCYCLINE HYCLATE 100 MG PO TABS: 200.0000 mg | ORAL_TABLET | Freq: Once | ORAL | 0 refills | Status: AC

## 2019-02-14 NOTE — Progress Notes (Signed)
Doxycycline dose x 1 200 MG dose for recent tick bite.

## 2019-02-14 NOTE — Chronic Care Management (AMB) (Signed)
  Care Management Note   Sharon [name] is a 73 y.o. year old female who is a primary care patient of [name], [name], NP. The CM team was consulted for assistance with chronic disease management and care coordination.   I reached out to [name] by phone today and provided CCM assistance. Patient reports to be doing well and contacted her PCP today for a tick bite. Doxycycline ordered and patient was notified. Patient reports that she has been implementing appropriate self-care into her daily routine by painting, crafts, genealogy research and crocheting.    Review of patient status, including review of consultants reports, relevant laboratory and other test results, and collaboration with appropriate care team members and the patient's provider was performed as part of comprehensive patient evaluation and provision of chronic care management services.   Goals Addressed    . "I don't have a lot of education on resources available to me and my spouse" (pt-stated)       Current Barriers:  . Limited education about Caregiver Support Resources* . Limited access to caregiver . Inability to perform IADL's independently Patient reports that she and spouse share and assist one another with performance of IADL's.   Clinical Social Work Clinical Goal(s):  Marland Kitchen Over the next 90 days, client will work with SW to address concerns related to lack of caregiver support within the home.  Interventions: . Patient interviewed and appropriate assessments performed . Implemented talk therapy during session to support patient and her needs around caregiver support . Provided education on available personal care service resources within the area  . Discussed plans with patient for ongoing care management follow up and provided patient with direct contact information for care management team . Provided education on level of care, senior centers, day programs, LTC and In [place]  Patient Self Care  Activities:  . Attends all scheduled provider appointments . Calls provider office for new concerns or questions  Plan:  . LCSW willfollow up within 30 days and reassess social work needs  Please see past updates related to this goal by clicking on the "Past Updates" button in the selected goal     Follow Up Plan: The CM team will reach out to the patient again over the next 30 days.   [name], BSW, MSW, [facility] Care Management [email or web address] Phone: [phone]

## 2019-02-15 ENCOUNTER — Ambulatory Visit (INDEPENDENT_AMBULATORY_CARE_PROVIDER_SITE_OTHER): Payer: Medicare Other

## 2019-02-15 DIAGNOSIS — K140 Glossitis: Secondary | ICD-10-CM | POA: Diagnosis not present

## 2019-02-15 DIAGNOSIS — E538 Deficiency of other specified B group vitamins: Secondary | ICD-10-CM | POA: Diagnosis not present

## 2019-02-17 ENCOUNTER — Ambulatory Visit: Payer: Self-pay | Admitting: *Deleted

## 2019-02-17 ENCOUNTER — Other Ambulatory Visit: Payer: Self-pay

## 2019-02-17 ENCOUNTER — Encounter: Payer: Self-pay | Admitting: Nurse Practitioner

## 2019-02-17 ENCOUNTER — Telehealth (INDEPENDENT_AMBULATORY_CARE_PROVIDER_SITE_OTHER): Payer: Medicare Other | Admitting: Nurse Practitioner

## 2019-02-17 VITALS — BP 107/70 | HR 81 | Temp 97.8°F | Ht 68.9 in | Wt 215.0 lb

## 2019-02-17 DIAGNOSIS — H6981 Other specified disorders of Eustachian tube, right ear: Secondary | ICD-10-CM | POA: Insufficient documentation

## 2019-02-17 MED ORDER — ONDANSETRON HCL 4 MG PO TABS: 4.0000 mg | ORAL_TABLET | Freq: Three times a day (TID) | ORAL | 0 refills | Status: DC | PRN

## 2019-02-17 MED ORDER — MECLIZINE HCL 12.5 MG PO TABS: 12.5000 mg | ORAL_TABLET | Freq: Three times a day (TID) | ORAL | 0 refills | Status: DC | PRN

## 2019-02-17 MED ORDER — PREDNISONE 10 MG PO TABS: 30.0000 mg | ORAL_TABLET | Freq: Every day | ORAL | 0 refills | Status: AC

## 2019-02-17 NOTE — Patient Instructions (Signed)

## 2019-02-17 NOTE — Telephone Encounter (Signed)
Dizziness, lightheaded, gait off- patient thinks she got water in her ears and she thinks she is off balance because she has water in her ears. Call to office- they are going to schedule her an appointment  Reason for Disposition . [1] MODERATE dizziness (e.g., vertigo; feels very unsteady, interferes with normal activities) AND [2] has NOT been evaluated by physician for this  Answer Assessment - Initial Assessment Questions 1. DESCRIPTION: "Describe your dizziness."     Patient states she gets dizzy with movement  2. LIGHTHEADED: "Do you feel lightheaded?" (e.g., somewhat faint, woozy, weak upon standing)     Dizzy only 3. VERTIGO: "Do you feel like either you or the room is spinning or tilting?" (i.e. vertigo)     Patient feels like she is spinning- patient can not even lay down without spinning 4. SEVERITY: "How bad is it?"  "Do you feel like you are going to faint?" "Can you stand and walk?"   - MILD - walking normally   - MODERATE - interferes with normal activities (e.g., work, school)    - SEVERE - unable to stand, requires support to walk, feels like passing out now.      Moderate-severe- patient did require help yesterday 5. ONSET:  "When did the dizziness begin?"     Last night 6. AGGRAVATING FACTORS: "Does anything make it worse?" (e.g., standing, change in head position)     Movement- quick movement 7. HEART RATE: "Can you tell me your heart rate?" "How many beats in 15 seconds?"  (Note: not all patients can do this)       Pulse was up- but not too elevated per her husband BP 120/88 8. CAUSE: "What do you think is causing the dizziness?"     Water in ear 9. RECURRENT SYMPTOM: "Have you had dizziness before?" If so, ask: "When was the last time?" "What happened that time?"     Childhood- water in ear 10. OTHER SYMPTOMS: "Do you have any other symptoms?" (e.g., fever, chest pain, vomiting, diarrhea, bleeding)       Nausea, balance off 11. PREGNANCY: "Is there any chance you  are pregnant?" "When was your last menstrual period?"       n/a  Protocols used: DIZZINESS - VERTIGO-A-AH, [place]

## 2019-02-17 NOTE — Assessment & Plan Note (Signed)
Script for Prednisone and Zofran sent.  Meclizine 1/2 tab script sent to use ONLY if needed, discussed with patient.  If worsening or continued issues seek face to face visit, she was able to verbalize back this plan of care and agreed.

## 2019-02-17 NOTE — Progress Notes (Signed)
BP 107/70 Comment: Pt reported  Pulse 81   Temp 97.8 F (36.6 C) (Oral)   Ht 5' 8.9" (1.75 m)   Wt 215 lb (97.5 kg)   LMP  (LMP Unknown)   BMI 31.84 kg/m    Subjective:    Patient ID: Sharon [name], female    DOB: 1946/02/04, 72 y.o.   MRN: [id number]  HPI: Sharon [name] is a 73 y.o. female  Chief Complaint  Patient presents with  . Dizziness    Started last night, about 11 p.m. last night. Got water in her ears on Tuesday.     . This visit was completed via telephone due to the restrictions of the COVID-19 pandemic. All issues as above were discussed and addressed but no physical exam was performed. If it was felt that the patient should be evaluated in the office, they were directed there. The patient verbally consented to this visit. Patient was unable to complete an audio/visual visit due to Lack of equipment. Due to the catastrophic nature of the COVID-19 pandemic, this visit was done through audio contact only. . Location of the patient: home . Location of the provider: home . Those involved with this call:  . Provider: [name], DNP . CMA: Yvonna Alanis, CMA . Front Desk/Registration: Jill Side  . Time spent on call: 15 minutes on the phone discussing health concerns. 10 minutes total spent in review of patient's record and preparation of their chart.   DIZZINESS Reports she feels it is due to getting water in right ear on Tuesday, in shower.  Reports dizziness is better today, was worse last night.  States "I wonder if I have swimmer's ear or just blocked my ear up".  Denies nausea today, only feels dizziness today if she moves too quick.  Denies pain in ears, but has "swishing sound" and did have ringing to right ear only.  Had some water come out of right ear this morning.  Denies issues with left ear.  No falls and denies feeling like she may fall. Duration: days Description of symptoms: off kilter Duration of episode: seconds Dizziness frequency: when  she was younger Provoking factors: movement of head quickly Aggravating factors:  moving head quickly Triggered by rolling over in bed: yes Triggered by bending over: no Aggravated by head movement: yes Aggravated by exertion, coughing, loud noises: no Recent head injury: no Recent or current viral symptoms: no History of vasovagal episodes: no Nausea: yes Vomiting: no Tinnitus: yes, right ear Hearing loss: no Aural fullness: no Headache: no Photophobia/phonophobia: no Unsteady gait: no Postural instability: no Diplopia, dysarthria, dysphagia or weakness: no Related to exertion: no Pallor: no Diaphoresis: no Dyspnea: no Chest pain: no  Relevant past medical, surgical, family and social history reviewed and updated as indicated. Interim medical history since our last visit reviewed. Allergies and medications reviewed and updated.  Review of Systems  Constitutional: Negative for activity change, appetite change, chills, diaphoresis, fatigue and fever.  HENT: Negative for congestion, ear discharge, ear pain, postnasal drip, rhinorrhea, sinus pressure, sinus pain, sneezing and sore throat.   Eyes: Negative for visual disturbance.  Respiratory: Negative for cough, chest tightness and shortness of breath.   Cardiovascular: Negative for chest pain, palpitations and leg swelling.  Gastrointestinal: Negative for abdominal distention, abdominal pain, constipation, diarrhea, nausea and vomiting.  Endocrine: Negative for cold intolerance, heat intolerance, polydipsia, polyphagia and polyuria.  Neurological: Positive for dizziness. Negative for syncope, facial asymmetry, speech difficulty, weakness, light-headedness, numbness  and headaches.  Psychiatric/Behavioral: Negative.     Per HPI unless specifically indicated above     Objective:    BP 107/70 Comment: Pt reported  Pulse 81   Temp 97.8 F (36.6 C) (Oral)   Ht 5' 8.9" (1.75 m)   Wt 215 lb (97.5 kg)   LMP  (LMP Unknown)    BMI 31.84 kg/m   Wt Readings from Last 3 Encounters:  02/17/19 215 lb (97.5 kg)  02/01/19 209 lb (94.8 kg)  01/11/19 209 lb (94.8 kg)    Physical Exam   Unable to perform visual exam as patient was not able to get visual access working.  Results for orders placed or performed in visit on 02/04/19  CUP PACEART REMOTE DEVICE CHECK  Result Value Ref Range   Date Time Interrogation Session [id number]    Pulse Generator Manufacturer MERM    Pulse Gen Model EVO35 Reveal LINQ    Pulse Gen Serial Number [id number]    Clinic Name [facility]    Implantable Pulse Generator Type ICM/ILR    Implantable Pulse Generator Implant Date [id number]       Assessment & Plan:   Problem List Items Addressed This Visit      Nervous and Auditory   Eustachian tube dysfunction, right - Primary    Script for Prednisone and Zofran sent.  Meclizine 1/2 tab script sent to use ONLY if needed, discussed with patient.  If worsening or continued issues seek face to face visit, she was able to verbalize back this plan of care and agreed.         I discussed the assessment and treatment plan with the patient. The patient was provided an opportunity to ask questions and all were answered. The patient agreed with the plan and demonstrated an understanding of the instructions.   The patient was advised to call back or seek an in-person evaluation if the symptoms worsen or if the condition fails to improve as anticipated.   I provided 15 minutes of time during this encounter.   Follow up plan: No follow-ups on file.

## 2019-02-17 NOTE — Telephone Encounter (Signed)
Patient put on mychart schedule appointment @ 11 with [facility]

## 2019-02-21 ENCOUNTER — Telehealth: Payer: Self-pay | Admitting: Internal Medicine

## 2019-02-21 NOTE — Telephone Encounter (Signed)
The pt had questions about the lights on the monitor. I let the pt know when it does that it is doing a software update. I told her she do not have to do anything when the lights come on. The pt states she understood.

## 2019-02-21 NOTE — Telephone Encounter (Signed)
I think pt is calling in regards to her device

## 2019-02-21 NOTE — Telephone Encounter (Signed)
New Message:    Patient calling concering her monitor. Please call patient.

## 2019-03-03 ENCOUNTER — Other Ambulatory Visit: Payer: Self-pay

## 2019-03-03 MED ORDER — CLOPIDOGREL BISULFATE 75 MG PO TABS: 75.0000 mg | ORAL_TABLET | Freq: Every day | ORAL | 6 refills | Status: DC

## 2019-03-09 ENCOUNTER — Ambulatory Visit (INDEPENDENT_AMBULATORY_CARE_PROVIDER_SITE_OTHER): Payer: Medicare Other | Admitting: *Deleted

## 2019-03-09 DIAGNOSIS — I6310 Cerebral infarction due to embolism of unspecified precerebral artery: Secondary | ICD-10-CM

## 2019-03-09 LAB — CUP PACEART REMOTE DEVICE CHECK
Date Time Interrogation Session: 20200527174042
Implantable Pulse Generator Implant Date: 20190328

## 2019-03-11 ENCOUNTER — Encounter: Payer: Self-pay | Admitting: Nurse Practitioner

## 2019-03-11 ENCOUNTER — Ambulatory Visit (INDEPENDENT_AMBULATORY_CARE_PROVIDER_SITE_OTHER): Payer: Medicare Other | Admitting: Nurse Practitioner

## 2019-03-11 ENCOUNTER — Other Ambulatory Visit: Payer: Self-pay

## 2019-03-11 VITALS — Temp 97.7°F | Wt 215.0 lb

## 2019-03-11 DIAGNOSIS — J441 Chronic obstructive pulmonary disease with (acute) exacerbation: Secondary | ICD-10-CM | POA: Insufficient documentation

## 2019-03-11 MED ORDER — AMOXICILLIN-POT CLAVULANATE 875-125 MG PO TABS: 1.0000 | ORAL_TABLET | Freq: Two times a day (BID) | ORAL | 0 refills | Status: DC

## 2019-03-11 MED ORDER — PREDNISONE 10 MG PO TABS: 30.0000 mg | ORAL_TABLET | Freq: Every day | ORAL | 0 refills | Status: AC

## 2019-03-11 MED ORDER — MONTELUKAST SODIUM 10 MG PO TABS: 10.0000 mg | ORAL_TABLET | Freq: Every day | ORAL | 3 refills | Status: DC

## 2019-03-11 NOTE — Patient Instructions (Signed)

## 2019-03-11 NOTE — Progress Notes (Signed)
Temp 97.7 F (36.5 C)   Wt 215 lb (97.5 kg)   LMP  (LMP Unknown)   BMI 31.84 kg/m    Subjective:    Patient ID: Sharon [name], female    DOB: 08-05-1946, 73 y.o.   MRN: [id number]  HPI: Sharon [name] is a 73 y.o. female  Chief Complaint  Patient presents with  . Cough    green mucus, that has a bad taste    . This visit was completed via telephone due to the restrictions of the COVID-19 pandemic. All issues as above were discussed and addressed but no physical exam was performed. If it was felt that the patient should be evaluated in the office, they were directed there. The patient verbally consented to this visit. Patient was unable to complete an audio/visual visit due to Lack of equipment. Due to the catastrophic nature of the COVID-19 pandemic, this visit was done through audio contact only. . Location of the patient: home . Location of the provider: home . Those involved with this call:  . Provider: [name], DNP . CMA: Tiffany Reel, CMA . Front Desk/Registration: Jill Side  . Time spent on call: 15 minutes on the phone discussing health concerns. 5 minutes total spent in review of patient's record and preparation of their chart. I verified patient identity using two factors (patient name and date of birth). Patient consents verbally to being seen via telemedicine visit today.   UPPER RESPIRATORY TRACT INFECTION Has underlying COPD.  Continues Symbicort daily and uses Proair as needed.  No longer taking Spiriva due to cost.  Currently has a productive cough, started about 4 days ago.  She reports having these episodes every spring and fall.  Takes Claritin daily, which she reports has always worked better than other allergy medications she has tried.  Discussed trial on Singulair with her and educated her on medication, she is interested in trying this "anything that will help me from getting sick like this during spring and fall".  Currently using Proair 4 times a  day due to cough, at baseline uses once to twice per week.  Has not been around anyone with Covid 19 that she knows of and has not traveled overseas.   Reports she has been staying inside and only been around her husband.  Her computer video access is broken, this provider discussed with her at length that this is telephone visit only.  She wishes not to come in office at this time due to concerns with Covid.  Provider discussed with her at length that if any worsening symptoms, such as fever, SOB, CP, then she is to immediately go to ER or urgent care for face to face visit.  She was able to verbalize this plan back to provider. Worst symptom: cough Fever: no Cough: yes Shortness of breath: no Wheezing: yes, only occasional Chest pain: no Chest tightness: no Chest congestion: yes Nasal congestion: no Runny nose: yes Post nasal drip: yes Sneezing: yes Sore throat: no Swollen glands: no Sinus pressure: no Headache: no Face pain: no Toothache: no Ear pain: none Ear pressure: none Eyes red/itching:no Eye drainage/crusting: no  Vomiting: no Rash: no Fatigue: no Sick contacts: no Strep contacts: no  Context: fluctuating Recurrent sinusitis: no Relief with OTC cold/cough medications: no  Treatments attempted: Tylenol and mucinex   Relevant past medical, surgical, family and social history reviewed and updated as indicated. Interim medical history since our last visit reviewed. Allergies and medications reviewed and  updated.  Review of Systems  Constitutional: Negative for activity change, appetite change, fatigue and fever.  HENT: Positive for postnasal drip and rhinorrhea. Negative for congestion, ear discharge, ear pain, facial swelling, sinus pressure, sinus pain, sneezing, sore throat and voice change.   Eyes: Negative for pain and visual disturbance.  Respiratory: Positive for cough and wheezing. Negative for chest tightness and shortness of breath.   Cardiovascular: Negative  for chest pain, palpitations and leg swelling.  Gastrointestinal: Negative for abdominal distention, abdominal pain, constipation, diarrhea, nausea and vomiting.  Endocrine: Negative.   Musculoskeletal: Positive for myalgias.  Neurological: Negative for dizziness, numbness and headaches.  Psychiatric/Behavioral: Negative.     Per HPI unless specifically indicated above     Objective:    Temp 97.7 F (36.5 C)   Wt 215 lb (97.5 kg)   LMP  (LMP Unknown)   BMI 31.84 kg/m   Wt Readings from Last 3 Encounters:  03/11/19 215 lb (97.5 kg)  02/17/19 215 lb (97.5 kg)  02/01/19 209 lb (94.8 kg)    Physical Exam   Unable to perform due to telephone visit only, but audible hoarseness and intermittent cough noted during conversation.  No audible SOB noted with talking, very talkative and pleasant.  Results for orders placed or performed in visit on 03/09/19  CUP PACEART REMOTE DEVICE CHECK  Result Value Ref Range   Date Time Interrogation Session [id number]    Pulse Generator Manufacturer MERM    Pulse Gen Model [id number] Reveal LINQ    Pulse Gen Serial Number [facility]    Implantable Pulse Generator Type ICM/ILR    Implantable Pulse Generator Implant Date [id number]       Assessment & Plan:   Problem List Items Addressed This Visit      Respiratory   COPD exacerbation ([place]) - Primary    Acute x 4 days.  Her computer video access is broken, this provider discussed with her at length that this is telephone visit only.  She wishes not to come in office or go to UC at this time due to concerns with Covid.  Provider discussed with her at length that if any worsening symptoms, such as fever, SOB, CP, then she is to immediately go to ER or urgent care for face to face visit.  She was able to verbalize this plan back to provider.  Scripts sent for Augmentin, Prednisone for acute cough.  Script for Montelukast sent due to ongoing seasonal exacerbations.   Recommend Mucinex as needed for cough and Coricidin for symptom management.  Return in one week for follow-up.      Relevant Medications   montelukast (SINGULAIR) 10 MG tablet   predniSONE (DELTASONE) 10 MG tablet      I discussed the assessment and treatment plan with the patient. The patient was provided an opportunity to ask questions and all were answered. The patient agreed with the plan and demonstrated an understanding of the instructions.   The patient was advised to call back or seek an in-person evaluation if the symptoms worsen or if the condition fails to improve as anticipated.   I provided 15 minutes of time during this encounter.  Follow up plan: Return in about 1 week (around 03/18/2019) for cough follow-up.

## 2019-03-11 NOTE — Assessment & Plan Note (Signed)
Acute x 4 days.  Her computer video access is broken, this provider discussed with her at length that this is telephone visit only.  She wishes not to come in office or go to UC at this time due to concerns with Covid.  Provider discussed with her at length that if any worsening symptoms, such as fever, SOB, CP, then she is to immediately go to ER or urgent care for face to face visit.  She was able to verbalize this plan back to provider.  Scripts sent for Augmentin, Prednisone for acute cough.  Script for Montelukast sent due to ongoing seasonal exacerbations.  Recommend Mucinex as needed for cough and Coricidin for symptom management.  Return in one week for follow-up.

## 2019-03-15 ENCOUNTER — Telehealth: Payer: Self-pay

## 2019-03-15 ENCOUNTER — Ambulatory Visit: Payer: Self-pay | Admitting: Pharmacist

## 2019-03-15 NOTE — Chronic Care Management (AMB) (Signed)
  Chronic Care Management   Note  03/15/2019 Name: RILEYANN FLORANCE MRN: [id number] DOB: May 27, 1946  [name] is a 73 y.o. year old female who is a primary care patient of [name], [name], NP. The CCM team was consulted for assistance with chronic disease management and care coordination needs.    Attempted to contact patient telephonically for medication management and assistance. Left HIPAA compliant message for patient to return my call at her convenience.   Follow up plan: - If I do not hear back, will follow up within 7 days  Catie Darnelle Maffucci, PharmD Clinical Pharmacist [place] [phone]

## 2019-03-16 ENCOUNTER — Ambulatory Visit (INDEPENDENT_AMBULATORY_CARE_PROVIDER_SITE_OTHER): Payer: Medicare Other | Admitting: Pharmacist

## 2019-03-16 DIAGNOSIS — J441 Chronic obstructive pulmonary disease with (acute) exacerbation: Secondary | ICD-10-CM

## 2019-03-16 DIAGNOSIS — J449 Chronic obstructive pulmonary disease, unspecified: Secondary | ICD-10-CM | POA: Diagnosis not present

## 2019-03-16 NOTE — Chronic Care Management (AMB) (Signed)
Chronic Care Management   Note  03/16/2019 Name: Sharon [name] MRN: [id number] DOB: 1945-10-28   Subjective:  Sharon [name] is a 73 y.o. year old female who is a primary care patient of [name], [name], NP. The CCM team was consulted for assistance with chronic disease management and care coordination needs.    Contacted patient today to follow up on medication cost concerns.    Review of patient status, including review of consultants reports, laboratory and other test data, was performed as part of comprehensive evaluation and provision of chronic care management services.   Objective:  Lab Results  Component Value Date   CREATININE 0.88 09/08/2018   CREATININE 0.95 07/28/2018   CREATININE 1.13 (H) 05/04/2018    Lab Results  Component Value Date   HGBA1C 5.7 (H) 01/08/2018       Component Value Date/Time   CHOL 158 01/08/2018 0406   CHOL 191 11/25/2017 1038   TRIG 45 01/08/2018 0406   HDL 54 01/08/2018 0406   HDL 63 11/25/2017 1038   CHOLHDL 2.9 01/08/2018 0406   VLDL 9 01/08/2018 0406   LDLCALC 95 01/08/2018 0406   LDLCALC 109 (H) 11/25/2017 1038    Clinical ASCVD: Yes - hx CVA   BP Readings from Last 3 Encounters:  02/17/19 107/70  01/11/19 109/66  12/07/18 133/76    Allergies  Allergen Reactions  . Baclofen Swelling  . Librium [Chlordiazepoxide] Itching    Dizziness   . Bactrim [Sulfamethoxazole-Trimethoprim] Nausea And Vomiting  . Ibuprofen Rash    Mouth swelling  . Latex Rash    Some bandaids, some gloves; BLOOD TEST NEGATIVE  . Naprosyn [Naproxen] Rash    Mouth swelling  . Other Rash    [place] nuts - mouth swelling    Medications Reviewed Today    Reviewed by De Hollingshead, [facility] (Pharmacist) on 03/16/19 at 1438  Med List Status: <None>  Medication Order Taking? Sig Documenting Provider Last Dose Status Informant  albuterol (PROAIR HFA) 108 (90 Base) MCG/ACT inhaler [id number] Yes Inhale 2 puffs into the lungs every 6 (six) hours as  needed for wheezing or shortness of breath. [name], NP Taking Active            Med Note De Hollingshead   Wed Mar 16, 2019  2:23 PM) QID during this acute illness, but generally 1-2x/day  amoxicillin-clavulanate (AUGMENTIN) 875-125 MG tablet [id number] Yes Take 1 tablet by mouth 2 (two) times daily for 7 days. [name] T, NP Taking Active   atorvastatin (LIPITOR) 40 MG tablet [id number] Yes TAKE 1 TABLET BY MOUTH ONCE A DAY AT 6 PM [name], Jolene T, NP Taking Active   citalopram (CELEXA) 20 MG tablet [id number] Yes Take 1 tablet (20 mg total) by mouth daily.  Patient taking differently:  Take 20 mg by mouth daily. Using PRN for anxiety with travel   Kathrine Haddock, NP Taking Active            Med Note ([place] Mar 16, 2019  2:28 PM)    clopidogrel (PLAVIX) 75 MG tablet [id number] Yes Take 1 tablet (75 mg total) by mouth daily. [name] T, NP Taking Active   Cyanocobalamin 1000 MCG/ML KIT [id number] Yes Inject 1,000 mcg as directed every 30 (thirty) days. [provider] Taking Active Self           Med Note Kellie Simmering, COURTNEY   Tue Oct 19, 2018 10:22 AM)  Emollient (EUCERIN) lotion [id number]  Apply topically as needed for dry skin. [provider]  Active   estradiol (VIVELLE-DOT) 0.1 MG/24HR patch [id number] Yes APPLY 1 PATCH ONTO THE SKIN 2 TIMES A WEEK Kathrine Haddock, NP Taking Active   guaiFENesin (MUCINEX) 600 MG 12 hr tablet [id number] No Take 1 tablet (600 mg total) by mouth 2 (two) times daily.  Patient not taking:  Reported on 03/16/2019   Volney American, PA-C Not Taking Active   meclizine (ANTIVERT) 12.5 MG tablet [id number] No Take 1 tablet (12.5 mg total) by mouth 3 (three) times daily as needed for dizziness (only if needed).  Patient not taking:  Reported on 03/16/2019   [name] T, NP Not Taking Active   montelukast (SINGULAIR) 10 MG tablet [id number] Yes Take 1 tablet (10 mg total) by mouth at bedtime. [name] T, NP Taking Active   Multiple Vitamin (MULTIVITAMIN) tablet [id number] Yes Take 2 tablets by mouth daily.  [provider] Taking Active Self           Med Note ([place], TIFFANY A   Thu Jan 14, 2018  9:39 AM)    pantoprazole (PROTONIX) 40 MG tablet [id number] Yes Take 1 tablet (40 mg total) by mouth daily. [name] T, NP Taking Active   predniSONE (DELTASONE) 10 MG tablet [id number] Yes Take 3 tablets (30 mg total) by mouth daily with breakfast for 5 days. [name], NP Taking Active   SYMBICORT 160-4.5 MCG/ACT inhaler [id number] Yes USE 2 PUFFS TWICE DAILY Kathrine Haddock, NP Taking Active   Tiotropium Bromide Monohydrate (SPIRIVA RESPIMAT) 2.5 MCG/ACT AERS [id number] No Inhale 2 puffs into the lungs daily.  Patient not taking:  Reported on 02/01/2019   Kathrine Haddock, NP Not Taking Active Self           Assessment:   Goals Addressed            This Visit's Progress     Patient Stated   . "I have a lot of medications" (pt-stated)       Current Barriers:  Marland Kitchen Knowledge Deficits related to over the counter and prescription medication use  . Asks about her medications:  o Currently taking a multivitamin that contains vitamin K, and she wonders if that is OK with clopidogrel. She also asks about "spots" that have developed on her arms since starting clopidogrel; her web searches have noted many case reports of these spots. She has discussed this with cardiology and they recommended she continue clopidogrel. o Often forgets to take her atorvastatin until 8 pm, and the script says 6 pm. o Likes to eat a small serving of grapefruit juice a few days a week, wonders if this is OK with her medications o Reports not using a pill box or other adherence aids, but notes adherence to her medication regimen  Pharmacist Clinical Goal(s):  Marland Kitchen Over the next 30 days, patient will work with PharmD to address questions related to medications  Interventions: . Comprehensive  medication review performed.  . Counseled that vitamin K does not interact with clopidogrel. I will investigate this report of skin spots with clopidogrel. Encouraged adherence . Educated that atorvastatin does NOT have to be taken at 6 pm, nor has to be taken in the evening . Educated on grapefruit drug interactions, but that she would have to be consuming much greater quantities of grapefruit to have clinically significant medication interactions  Patient Self Care Activities:  . Self administers medications as  prescribed  Initial goal documentation     . "My breathing is bad" (pt-stated)       Current Barriers:  . Former smoker, with current COPD; 1 outpatient exacerbation in the past 6 months, high symptom burden, likely COPD B; denies hx asthma diagnosis, though notes being told she had "recurrent bronchitis" as a child. Endorses significant issues with seasonal allergies. Currently managed on Symbicort monotherapy; she had not been taking Spiriva d/t cost. Cost concerns have been alleviated d/t patient working out issues with [employer] coverage o Patient has asked [facility] multiple times to fill prescriptions as 90 day supplies, but they will only dispense as 30.  . No hx PFT on file, or that the patient is aware of . Reports currently needing albuterol up to QID, though at baseline using BID . Recent allergic concerns exacerbating breathing; recently started on montelukast; patient stopped loratidine and Mucinex, thinking she couldn't take these all in combination  Pharmacist Clinical Goal(s):  Marland Kitchen Over the next 90 days, patient will work with CCM team and primary care provider to address needs related to optimized management of chronic lung disease  Interventions: . Comprehensive medication review performed.  . Educated on purpose of PFT testing, LABA vs LAMA vs ICS therapy, and preferred COPD therapy and how this would change if she additionally has evidence of restrictive lung disease.  Recommended that PFTs be discussed at next PCP visit in August.  . Educated that she could use loratidine, montelukast, and Mucinex in combination. Recommended to stay well hydrated to help with mucus clearance.   Patient Self Care Activities:  . Self administers medications as prescribed . Calls pharmacy for medication refills  Initial goal documentation        Plan: - PharmD will follow up with patient within 30 days for continued medication management.   Catie Darnelle Maffucci, PharmD Clinical Pharmacist [place] [phone]

## 2019-03-16 NOTE — Patient Instructions (Signed)
Visit Information  Goals Addressed            This Visit's Progress     Patient Stated   . "I have a lot of medications" (pt-stated)       Current Barriers:  Marland Kitchen Knowledge Deficits related to over the counter and prescription medication use  . Asks about her medications:  o Currently taking a multivitamin that contains vitamin K, and she wonders if that is OK with clopidogrel. She also asks about "spots" that have developed on her arms since starting clopidogrel; her web searches have noted many case reports of these spots. She has discussed this with cardiology and they recommended she continue clopidogrel. o Often forgets to take her atorvastatin until 8 pm, and the script says 6 pm. o Likes to eat a small serving of grapefruit juice a few days a week, wonders if this is OK with her medications o Reports not using a pill box or other adherence aids, but notes adherence to her medication regimen  Pharmacist Clinical Goal(s):  Marland Kitchen Over the next 30 days, patient will work with PharmD to address questions related to medications  Interventions: . Comprehensive medication review performed.  . Counseled that vitamin K does not interact with clopidogrel. I will investigate this report of skin spots with clopidogrel. Encouraged adherence . Educated that atorvastatin does NOT have to be taken at 6 pm, nor has to be taken in the evening . Educated on grapefruit drug interactions, but that she would have to be consuming much greater quantities of grapefruit to have clinically significant medication interactions  Patient Self Care Activities:  . Self administers medications as prescribed  Initial goal documentation     . "My breathing is bad" (pt-stated)       Current Barriers:  . Former smoker, with current COPD; 1 outpatient exacerbation in the past 6 months, high symptom burden, likely COPD B; denies hx asthma diagnosis, though notes being told she had "recurrent bronchitis" as a child.  Endorses significant issues with seasonal allergies. Currently managed on Symbicort monotherapy; she had not been taking Spiriva d/t cost. Cost concerns have been alleviated d/t patient working out issues with [employer] coverage o Patient has asked [facility] multiple times to fill prescriptions as 90 day supplies, but they will only dispense as 30.  . No hx PFT on file, or that the patient is aware of . Reports currently needing albuterol up to QID, though at baseline using BID . Recent allergic concerns exacerbating breathing; recently started on montelukast; patient stopped loratidine and Mucinex, thinking she couldn't take these all in combination  Pharmacist Clinical Goal(s):  Marland Kitchen Over the next 90 days, patient will work with CCM team and primary care provider to address needs related to optimized management of chronic lung disease  Interventions: . Comprehensive medication review performed.  . Educated on purpose of PFT testing, LABA vs LAMA vs ICS therapy, and preferred COPD therapy and how this would change if she additionally has evidence of restrictive lung disease. Recommended that PFTs be discussed at next PCP visit in August.  . Educated that she could use loratidine, montelukast, and Mucinex in combination. Recommended to stay well hydrated to help with mucus clearance.   Patient Self Care Activities:  . Self administers medications as prescribed . Calls pharmacy for medication refills  Initial goal documentation        The patient verbalized understanding of instructions provided today and declined a print copy of patient instruction materials.  Plan: - PharmD will follow up with patient within 30 days for continued medication management.   Catie Darnelle Maffucci, PharmD Clinical Pharmacist [place] [phone]

## 2019-03-17 ENCOUNTER — Telehealth: Payer: Self-pay | Admitting: Nurse Practitioner

## 2019-03-17 NOTE — Telephone Encounter (Signed)
Called pt to go over script screening for covid-19, no answer left voicemail.

## 2019-03-18 ENCOUNTER — Other Ambulatory Visit: Payer: Self-pay

## 2019-03-18 ENCOUNTER — Ambulatory Visit
Admission: RE | Admit: 2019-03-18 | Discharge: 2019-03-18 | Disposition: A | Discharge status: Home / Self Care | Payer: Medicare Other | Attending: Family Medicine | Admitting: Family Medicine

## 2019-03-18 ENCOUNTER — Ambulatory Visit (INDEPENDENT_AMBULATORY_CARE_PROVIDER_SITE_OTHER): Payer: Medicare Other | Admitting: Family Medicine

## 2019-03-18 ENCOUNTER — Encounter: Payer: Self-pay | Admitting: Family Medicine

## 2019-03-18 ENCOUNTER — Ambulatory Visit
Admission: RE | Admit: 2019-03-18 | Discharge: 2019-03-18 | Disposition: A | Discharge status: Home / Self Care | Payer: Medicare Other | Source: Ambulatory Visit | Attending: Family Medicine | Admitting: Family Medicine

## 2019-03-18 ENCOUNTER — Ambulatory Visit

## 2019-03-18 VITALS — BP 128/69 | HR 100 | Temp 98.3°F | Ht 68.3 in | Wt 221.0 lb

## 2019-03-18 DIAGNOSIS — J181 Lobar pneumonia, unspecified organism: Secondary | ICD-10-CM | POA: Diagnosis not present

## 2019-03-18 DIAGNOSIS — R0989 Other specified symptoms and signs involving the circulatory and respiratory systems: Secondary | ICD-10-CM

## 2019-03-18 DIAGNOSIS — R05 Cough: Secondary | ICD-10-CM | POA: Diagnosis not present

## 2019-03-18 DIAGNOSIS — E538 Deficiency of other specified B group vitamins: Secondary | ICD-10-CM | POA: Diagnosis not present

## 2019-03-18 DIAGNOSIS — J189 Pneumonia, unspecified organism: Secondary | ICD-10-CM

## 2019-03-18 MED ORDER — CYANOCOBALAMIN 1000 MCG/ML IJ SOLN
1000.0000 ug | Freq: Once | INTRAMUSCULAR | Status: AC
  Administered 2019-03-18: 1000 ug via INTRAMUSCULAR

## 2019-03-18 MED ORDER — AMOXICILLIN-POT CLAVULANATE 875-125 MG PO TABS: 1.0000 | ORAL_TABLET | Freq: Two times a day (BID) | ORAL | 0 refills | Status: DC

## 2019-03-18 NOTE — Progress Notes (Signed)
Carelink Summary Report / Loop Recorder 

## 2019-03-18 NOTE — Progress Notes (Signed)
BP 128/69   Pulse 100   Temp 98.3 F (36.8 C) (Oral)   Ht 5' 8.3" (1.735 m)   Wt 221 lb (100.2 kg)   LMP  (LMP Unknown)   SpO2 95%   BMI 33.31 kg/m    Subjective:    Patient ID: Sharon [name], female    DOB: June 16, 1946, 73 y.o.   MRN: [id number]  HPI: [name] is a 73 y.o. female  Chief Complaint  Patient presents with  . COPD    f/u  . B12 Injection   Had bronchitis about a week ago. Treated with augmentin and PCP wanted her seen for in-person evaluation. At that time she was coughing up green stuff, no SOB, no fevers. Was wheezing and having allergy symptoms. Since then, she notes that she is feeling better. She is no longer coughing up green stuff. Not coughing as much. Still slightly SOB- but blames this on the heat. No fevers. Still not 100%, but feeling significantly better.   Relevant past medical, surgical, family and social history reviewed and updated as indicated. Interim medical history since our last visit reviewed. Allergies and medications reviewed and updated.  Review of Systems  Constitutional: Positive for unexpected weight change. Negative for activity change, appetite change, chills, diaphoresis, fatigue and fever.  HENT: Positive for congestion and rhinorrhea. Negative for dental problem, drooling, ear discharge, ear pain, facial swelling, hearing loss, mouth sores, nosebleeds, postnasal drip, sinus pressure, sinus pain, sneezing, sore throat, tinnitus, trouble swallowing and voice change.   Eyes: Negative.   Respiratory: Positive for cough, chest tightness and shortness of breath. Negative for apnea, choking, wheezing and stridor.   Cardiovascular: Negative.   Gastrointestinal: Negative.   Neurological: Negative.   Psychiatric/Behavioral: Negative.     Per HPI unless specifically indicated above     Objective:    BP 128/69   Pulse 100   Temp 98.3 F (36.8 C) (Oral)   Ht 5' 8.3" (1.735 m)   Wt 221 lb (100.2 kg)   LMP  (LMP Unknown)    SpO2 95%   BMI 33.31 kg/m   Wt Readings from Last 3 Encounters:  03/18/19 221 lb (100.2 kg)  03/11/19 215 lb (97.5 kg)  02/17/19 215 lb (97.5 kg)    Physical Exam Vitals signs and nursing note reviewed.  Constitutional:      General: She is not in acute distress.    Appearance: Normal appearance. She is not ill-appearing, toxic-appearing or diaphoretic.  HENT:     Head: Normocephalic and atraumatic.     Right Ear: External ear normal.     Left Ear: External ear normal.     Nose: Nose normal.     Mouth/Throat:     Mouth: Mucous membranes are moist.     Pharynx: Oropharynx is clear.  Eyes:     General: No scleral icterus.       Right eye: No discharge.        Left eye: No discharge.     Extraocular Movements: Extraocular movements intact.     Conjunctiva/sclera: Conjunctivae normal.     Pupils: Pupils are equal, round, and reactive to light.  Neck:     Musculoskeletal: Normal range of motion and neck supple.  Cardiovascular:     Rate and Rhythm: Normal rate and regular rhythm.     Pulses: Normal pulses.     Heart sounds: Normal heart sounds. No murmur. No friction rub. No gallop.   Pulmonary:  Effort: Pulmonary effort is normal. No respiratory distress.     Breath sounds: No stridor. Rhonchi present. No wheezing or rales.  Chest:     Chest wall: No tenderness.  Musculoskeletal: Normal range of motion.  Skin:    General: Skin is warm and dry.     Capillary Refill: Capillary refill takes less than 2 seconds.     Coloration: Skin is not jaundiced or pale.     Findings: No bruising, erythema, lesion or rash.  Neurological:     General: No focal deficit present.     Mental Status: She is alert and oriented to person, place, and time. Mental status is at baseline.  Psychiatric:        Mood and Affect: Mood normal.        Behavior: Behavior normal.        Thought Content: Thought content normal.        Judgment: Judgment normal.     Results for orders placed or  performed in visit on 03/09/19  CUP PACEART REMOTE DEVICE CHECK  Result Value Ref Range   Date Time Interrogation Session [id number]    Pulse Generator Manufacturer MERM    Pulse Gen Model VHQ46 Reveal LINQ    Pulse Gen Serial Number [id number]    Clinic Name [facility]    Implantable Pulse Generator Type ICM/ILR    Implantable Pulse Generator Implant Date [id number]       Assessment & Plan:   Problem List Items Addressed This Visit      Other   Vitamin B12 deficiency    Due for shot. Given today. Call with any concerns.        Other Visit Diagnoses    Pneumonia of right lower lobe due to infectious organism ([place])    -  Primary   Will extend augmentin to 10 days and obtain CXR. Follow up with PCP Wednesday. Call with any concerns.    Relevant Medications   Loratadine (CLARITIN PO)   amoxicillin-clavulanate (AUGMENTIN) 875-125 MG tablet   Rhonchi       Will obtain CXR and extend augmentin to 10 days. Call with any concerns. Follow up with PCP next Wednesday.   Relevant Orders   DG Chest 2 View   B12 deficiency       Relevant Medications   cyanocobalamin ((VITAMIN B-12)) injection 1,000 mcg (Start on 03/18/2019  2:45 PM)       Follow up plan: Return Wednesday with Jolene.

## 2019-03-18 NOTE — Assessment & Plan Note (Signed)
Due for shot. Given today. Call with any concerns.

## 2019-03-21 ENCOUNTER — Other Ambulatory Visit: Payer: Self-pay

## 2019-03-21 ENCOUNTER — Ambulatory Visit: Payer: Self-pay | Admitting: Licensed Clinical Social Worker

## 2019-03-21 DIAGNOSIS — J449 Chronic obstructive pulmonary disease, unspecified: Secondary | ICD-10-CM

## 2019-03-21 DIAGNOSIS — F419 Anxiety disorder, unspecified: Secondary | ICD-10-CM

## 2019-03-21 DIAGNOSIS — J441 Chronic obstructive pulmonary disease with (acute) exacerbation: Secondary | ICD-10-CM | POA: Diagnosis not present

## 2019-03-21 NOTE — Chronic Care Management (AMB) (Signed)
  Care Management Note   Sharon [name] is a 73 y.o. year old female who is a primary care patient of [name], [name], NP. The CM team was consulted for assistance with chronic disease management and care coordination.   I reached out to [name] by phone today.   Review of patient status, including review of consultants reports, relevant laboratory and other test results, and collaboration with appropriate care team members and the patient's provider was performed as part of comprehensive patient evaluation and provision of chronic care management services.   Goals Addressed    . "I don't have a lot of education on resources available to me and my spouse" (pt-stated)       Current Barriers:  . Limited education about Caregiver Support Resources* . Limited access to caregiver . Inability to perform IADL's independently Patient reports that she and spouse share and assist one another with performance of IADL's.   Clinical Social Work Clinical Goal(s):  Marland Kitchen Over the next 90 days, client will work with SW to address concerns related to lack of caregiver support within the home.  Interventions: . Patient interviewed and appropriate assessments performed . Implemented talk therapy during session to support patient and her needs around caregiver support. Patient reports that she has pneumonia in her right lung which has caused her a lot of anxiety and stress. LCSW provided education on deep breathing and relaxation techniques to implement into their daily routine to combat stressors  . Provided education on available personal care service resources within the area  . Discussed plans with patient for ongoing care management follow up and provided patient with direct contact information for care management team . Provided education on level of care, senior centers, adult day programs, VA benefits and assistance in terms of in home care and placement, LTC and In [employer].   Patient  Self Care Activities:  . Attends all scheduled provider appointments . Calls provider office for new concerns or questions  Plan:  . LCSW willfollow up within 30 days and reassess social work needs  Please see past updates related to this goal by clicking on the "Past Updates" button in the selected goal      Follow Up Plan: The care management team will reach out to the patient again next month.  [name], BSW, MSW, [email or web address] Phone: [phone]

## 2019-03-22 ENCOUNTER — Telehealth: Payer: Self-pay

## 2019-03-24 ENCOUNTER — Ambulatory Visit: Admitting: Nurse Practitioner

## 2019-03-30 ENCOUNTER — Ambulatory Visit (INDEPENDENT_AMBULATORY_CARE_PROVIDER_SITE_OTHER): Payer: Medicare Other | Admitting: Nurse Practitioner

## 2019-03-30 ENCOUNTER — Other Ambulatory Visit: Payer: Self-pay

## 2019-03-30 ENCOUNTER — Encounter: Payer: Self-pay | Admitting: Nurse Practitioner

## 2019-03-30 DIAGNOSIS — J441 Chronic obstructive pulmonary disease with (acute) exacerbation: Secondary | ICD-10-CM | POA: Diagnosis not present

## 2019-03-30 NOTE — Patient Instructions (Addendum)
DEBROX at drug store, follow directions on box.   Acute Bronchitis, Adult Acute bronchitis is when air tubes (bronchi) in the lungs suddenly get swollen. The condition can make it hard to breathe. It can also cause these symptoms:  A cough.  Coughing up clear, yellow, or green mucus.  Wheezing.  Chest congestion.  Shortness of breath.  A fever.  Body aches.  Chills.  A sore throat. Follow these instructions at home:  Medicines  Take over-the-counter and prescription medicines only as told by your doctor.  If you were prescribed an antibiotic medicine, take it as told by your doctor. Do not stop taking the antibiotic even if you start to feel better. General instructions  Rest.  Drink enough fluids to keep your pee (urine) pale yellow.  Avoid smoking and secondhand smoke. If you smoke and you need help quitting, ask your doctor. Quitting will help your lungs heal faster.  Use an inhaler, cool mist vaporizer, or humidifier as told by your doctor.  Keep all follow-up visits as told by your doctor. This is important. How is this prevented? To lower your risk of getting this condition again:  Wash your hands often with soap and water. If you cannot use soap and water, use hand sanitizer.  Avoid contact with people who have cold symptoms.  Try not to touch your hands to your mouth, nose, or eyes.  Make sure to get the flu shot every year. Contact a doctor if:  Your symptoms do not get better in 2 weeks. Get help right away if:  You cough up blood.  You have chest pain.  You have very bad shortness of breath.  You become dehydrated.  You faint (pass out) or keep feeling like you are going to pass out.  You keep throwing up (vomiting).  You have a very bad headache.  Your fever or chills gets worse. This information is not intended to replace advice given to you by your health care provider. Make sure you discuss any questions you have with your health  care provider. Document Released: 03/17/2008 Document Revised: 05/13/2017 Document Reviewed: 03/19/2016 Elsevier Interactive Patient Education  2019 [employer].

## 2019-03-30 NOTE — Assessment & Plan Note (Signed)
Acute and improving.  Negative CXR 03/18/2019.  Lung sounds clear today.  Recommend continuing current inhaler regimen and Singulair.  Return for worsening symptoms.

## 2019-03-30 NOTE — Progress Notes (Signed)
BP 139/76   Pulse 95   Temp 98.3 F (36.8 C) (Oral)   Ht 5' 8.3" (1.735 m)   Wt 221 lb (100.2 kg)   LMP  (LMP Unknown)   SpO2 97%   BMI 33.31 kg/m    Subjective:    Patient ID: Sharon [name], female    DOB: Jun 19, 1946, 73 y.o.   MRN: [id number]  HPI: Sharon [name] is a 73 y.o. female  Chief Complaint  Patient presents with  . Follow-up    Still coughing and productive, but it has improved.    UPPER RESPIRATORY TRACT INFECTION Initial treatment for exacerbation 03/11/2019.  Treatment extended on 03/18/2019, treated with Augmentin for exacerbation/PNA. Chest xray returned negative.  Recently started on Singulair due to allergies and cats in house.  States no longer coughing up colored phlegm, now is clear.   States cough is less frequent and consistent.   Fever: no Cough: yes Shortness of breath: no Wheezing: yes, but improved Chest pain: no Chest tightness: no Chest congestion: no Nasal congestion: no Runny nose: no Post nasal drip: no Sneezing: no Sore throat: no Swollen glands: no Sinus pressure: no Headache: no Face pain: no Toothache: no Ear pain: none Ear pressure: none Eyes red/itching:no Eye drainage/crusting: no  Vomiting: no Rash: no Fatigue: yes Sick contacts: no Strep contacts: no  Context: fluctuating Recurrent sinusitis: no Relief with OTC cold/cough medications: no  Treatments attempted: antibiotics   Relevant past medical, surgical, family and social history reviewed and updated as indicated. Interim medical history since our last visit reviewed. Allergies and medications reviewed and updated.  Review of Systems  Constitutional: Positive for fatigue. Negative for activity change, appetite change, diaphoresis and fever.  HENT: Negative for congestion, ear discharge, ear pain, facial swelling, postnasal drip, rhinorrhea, sinus pressure, sinus pain, sneezing, sore throat and voice change.   Eyes: Negative for pain and visual disturbance.   Respiratory: Positive for cough. Negative for chest tightness, shortness of breath and wheezing.   Cardiovascular: Negative for chest pain, palpitations and leg swelling.  Gastrointestinal: Negative for abdominal distention, abdominal pain, constipation, diarrhea, nausea and vomiting.  Endocrine: Negative.   Musculoskeletal: Negative for myalgias.  Neurological: Negative for dizziness, numbness and headaches.  Psychiatric/Behavioral: Negative.     Per HPI unless specifically indicated above     Objective:    BP 139/76   Pulse 95   Temp 98.3 F (36.8 C) (Oral)   Ht 5' 8.3" (1.735 m)   Wt 221 lb (100.2 kg)   LMP  (LMP Unknown)   SpO2 97%   BMI 33.31 kg/m   Wt Readings from Last 3 Encounters:  03/30/19 221 lb (100.2 kg)  03/18/19 221 lb (100.2 kg)  03/11/19 215 lb (97.5 kg)    Physical Exam Vitals signs and nursing note reviewed.  Constitutional:      General: She is awake. She is not in acute distress.    Appearance: She is well-developed. She is not ill-appearing.  HENT:     Head: Normocephalic.     Right Ear: Hearing, tympanic membrane, ear canal and external ear normal.     Left Ear: Hearing, tympanic membrane, ear canal and external ear normal.     Ears:     Comments: Both ears with small amount cerumen, no impaction.    Nose: Nose normal. No mucosal edema or rhinorrhea.     Mouth/Throat:     Mouth: Mucous membranes are moist.     Pharynx: Oropharynx  is clear. No pharyngeal swelling, oropharyngeal exudate or posterior oropharyngeal erythema.  Eyes:     General: Lids are normal.        Right eye: No discharge.        Left eye: No discharge.     Conjunctiva/sclera: Conjunctivae normal.     Pupils: Pupils are equal, round, and reactive to light.  Neck:     Musculoskeletal: Normal range of motion and neck supple.     Thyroid: No thyromegaly.     Vascular: No carotid bruit or JVD.  Cardiovascular:     Rate and Rhythm: Normal rate and regular rhythm.     Heart  sounds: Normal heart sounds. No murmur. No gallop.   Pulmonary:     Effort: Pulmonary effort is normal. No accessory muscle usage or respiratory distress.     Breath sounds: Normal breath sounds.     Comments: Lung clear throughout and voice less hoarse, back to baseline, on exam today. Abdominal:     General: Bowel sounds are normal.     Palpations: Abdomen is soft.  Musculoskeletal:     Right lower leg: No edema.     Left lower leg: No edema.  Lymphadenopathy:     Cervical: No cervical adenopathy.  Skin:    General: Skin is warm and dry.  Neurological:     Mental Status: She is alert and oriented to person, place, and time.  Psychiatric:        Attention and Perception: Attention normal.        Mood and Affect: Mood normal.        Behavior: Behavior normal. Behavior is cooperative.        Thought Content: Thought content normal.        Judgment: Judgment normal.     Results for orders placed or performed in visit on 03/09/19  CUP PACEART REMOTE DEVICE CHECK  Result Value Ref Range   Date Time Interrogation Session [id number]    Pulse Generator Manufacturer MERM    Pulse Gen Model MHW80 Reveal LINQ    Pulse Gen Serial Number [id number]    Clinic Name [facility]    Implantable Pulse Generator Type ICM/ILR    Implantable Pulse Generator Implant Date [id number]       Assessment & Plan:   Problem List Items Addressed This Visit      Respiratory   COPD exacerbation ([place])    Acute and improving.  Negative CXR 03/18/2019.  Lung sounds clear today.  Recommend continuing current inhaler regimen and Singulair.  Return for worsening symptoms.          Follow up plan: Return if symptoms worsen or fail to improve.

## 2019-04-01 ENCOUNTER — Other Ambulatory Visit: Payer: Self-pay | Admitting: Nurse Practitioner

## 2019-04-01 NOTE — Telephone Encounter (Signed)
#  [phone] with refills were sent, pt is requesting #[phone] instead... ok to change rx?

## 2019-04-07 ENCOUNTER — Ambulatory Visit (INDEPENDENT_AMBULATORY_CARE_PROVIDER_SITE_OTHER): Payer: Medicare Other

## 2019-04-07 DIAGNOSIS — Z0000 Encounter for general adult medical examination without abnormal findings: Secondary | ICD-10-CM | POA: Diagnosis not present

## 2019-04-07 NOTE — Patient Instructions (Addendum)
Sharon [name] , Thank you for taking time to come for your Medicare Wellness Visit. I appreciate your ongoing commitment to your health goals. Please review the following plan we discussed and let me know if I can assist you in the future.   Screening recommendations/referrals: Colonoscopy: completed 12/13/2015 Mammogram: completed 06/29/2018 Bone Density: completed 08/05/2018 Recommended yearly ophthalmology/optometry visit for glaucoma screening and checkup Recommended yearly dental visit for hygiene and checkup  Vaccinations: Influenza vaccine: up to date  Pneumococcal vaccine: up to date  Tdap vaccine: up to date  Shingles vaccine: shingrix eligible, check with your insurance company for coverage     Advanced directives: please pick up a copy of this information next time you are in the office.   Conditions/risks identified: lymphedema   Next appointment: Follow up in one year for your annual wellness exam.    Preventive Care 65 Years and Older, Female Preventive care refers to lifestyle choices and visits with your health care provider that can promote health and wellness. What does preventive care include?  A yearly physical exam. This is also called an annual well check.  Dental exams once or twice a year.  Routine eye exams. Ask your health care provider how often you should have your eyes checked.  Personal lifestyle choices, including:  Daily care of your teeth and gums.  Regular physical activity.  Eating a healthy diet.  Avoiding tobacco and drug use.  Limiting alcohol use.  Practicing safe sex.  Taking low-dose aspirin every day.  Taking vitamin and mineral supplements as recommended by your health care provider. What happens during an annual well check? The services and screenings done by your health care provider during your annual well check will depend on your age, overall health, lifestyle risk factors, and family history of disease. Counseling  Your  health care provider may ask you questions about your:  Alcohol use.  Tobacco use.  Drug use.  Emotional well-being.  Home and relationship well-being.  Sexual activity.  Eating habits.  History of falls.  Memory and ability to understand (cognition).  Work and work Statistician.  Reproductive health. Screening  You may have the following tests or measurements:  Height, weight, and BMI.  Blood pressure.  Lipid and cholesterol levels. These may be checked every 5 years, or more frequently if you are over 68 years old.  Skin check.  Lung cancer screening. You may have this screening every year starting at age 69 if you have a 30-pack-year history of smoking and currently smoke or have quit within the past 15 years.  Fecal occult blood test (FOBT) of the stool. You may have this test every year starting at age 46.  Flexible sigmoidoscopy or colonoscopy. You may have a sigmoidoscopy every 5 years or a colonoscopy every 10 years starting at age 49.  Hepatitis C blood test.  Hepatitis B blood test.  Sexually transmitted disease (STD) testing.  Diabetes screening. This is done by checking your blood sugar (glucose) after you have not eaten for a while (fasting). You may have this done every 1-3 years.  Bone density scan. This is done to screen for osteoporosis. You may have this done starting at age 10.  Mammogram. This may be done every 1-2 years. Talk to your health care provider about how often you should have regular mammograms. Talk with your health care provider about your test results, treatment options, and if necessary, the need for more tests. Vaccines  Your health care provider may recommend  certain vaccines, such as:  Influenza vaccine. This is recommended every year.  Tetanus, diphtheria, and acellular pertussis (Tdap, Td) vaccine. You may need a Td booster every 10 years.  Zoster vaccine. You may need this after age 9.  Pneumococcal 13-valent  conjugate (PCV13) vaccine. One dose is recommended after age 81.  Pneumococcal polysaccharide (PPSV23) vaccine. One dose is recommended after age 45. Talk to your health care provider about which screenings and vaccines you need and how often you need them. This information is not intended to replace advice given to you by your health care provider. Make sure you discuss any questions you have with your health care provider. Document Released: 10/26/2015 Document Revised: 06/18/2016 Document Reviewed: 07/31/2015 Elsevier Interactive Patient Education  2017 [employer] Prevention in the Home Falls can cause injuries. They can happen to people of all ages. There are many things you can do to make your home safe and to help prevent falls. What can I do on the outside of my home?  Regularly fix the edges of walkways and driveways and fix any cracks.  Remove anything that might make you trip as you walk through a door, such as a raised step or threshold.  Trim any bushes or trees on the path to your home.  Use bright outdoor lighting.  Clear any walking paths of anything that might make someone trip, such as rocks or tools.  Regularly check to see if handrails are loose or broken. Make sure that both sides of any steps have handrails.  Any raised decks and porches should have guardrails on the edges.  Have any leaves, snow, or ice cleared regularly.  Use sand or salt on walking paths during winter.  Clean up any spills in your garage right away. This includes oil or grease spills. What can I do in the bathroom?  Use night lights.  Install grab bars by the toilet and in the tub and shower. Do not use towel bars as grab bars.  Use non-skid mats or decals in the tub or shower.  If you need to sit down in the shower, use a plastic, non-slip stool.  Keep the floor dry. Clean up any water that spills on the floor as soon as it happens.  Remove soap buildup in the tub or  shower regularly.  Attach bath mats securely with double-sided non-slip rug tape.  Do not have throw rugs and other things on the floor that can make you trip. What can I do in the bedroom?  Use night lights.  Make sure that you have a light by your bed that is easy to reach.  Do not use any sheets or blankets that are too big for your bed. They should not hang down onto the floor.  Have a firm chair that has side arms. You can use this for support while you get dressed.  Do not have throw rugs and other things on the floor that can make you trip. What can I do in the kitchen?  Clean up any spills right away.  Avoid walking on wet floors.  Keep items that you use a lot in easy-to-reach places.  If you need to reach something above you, use a strong step stool that has a grab bar.  Keep electrical cords out of the way.  Do not use floor polish or wax that makes floors slippery. If you must use wax, use non-skid floor wax.  Do not have throw rugs and other  things on the floor that can make you trip. What can I do with my stairs?  Do not leave any items on the stairs.  Make sure that there are handrails on both sides of the stairs and use them. Fix handrails that are broken or loose. Make sure that handrails are as long as the stairways.  Check any carpeting to make sure that it is firmly attached to the stairs. Fix any carpet that is loose or worn.  Avoid having throw rugs at the top or bottom of the stairs. If you do have throw rugs, attach them to the floor with carpet tape.  Make sure that you have a light switch at the top of the stairs and the bottom of the stairs. If you do not have them, ask someone to add them for you. What else can I do to help prevent falls?  Wear shoes that:  Do not have high heels.  Have rubber bottoms.  Are comfortable and fit you well.  Are closed at the toe. Do not wear sandals.  If you use a stepladder:  Make sure that it is fully  opened. Do not climb a closed stepladder.  Make sure that both sides of the stepladder are locked into place.  Ask someone to hold it for you, if possible.  Clearly mark and make sure that you can see:  Any grab bars or handrails.  First and last steps.  Where the edge of each step is.  Use tools that help you move around (mobility aids) if they are needed. These include:  Canes.  Walkers.  Scooters.  Crutches.  Turn on the lights when you go into a dark area. Replace any light bulbs as soon as they burn out.  Set up your furniture so you have a clear path. Avoid moving your furniture around.  If any of your floors are uneven, fix them.  If there are any pets around you, be aware of where they are.  Review your medicines with your doctor. Some medicines can make you feel dizzy. This can increase your chance of falling. Ask your doctor what other things that you can do to help prevent falls. This information is not intended to replace advice given to you by your health care provider. Make sure you discuss any questions you have with your health care provider. Document Released: 07/26/2009 Document Revised: 03/06/2016 Document Reviewed: 11/03/2014 Elsevier Interactive Patient Education  2017 [employer].

## 2019-04-07 NOTE — Progress Notes (Signed)
Subjective:   Sharon [name] is a 73 y.o. female who presents for Medicare Annual (Subsequent) preventive examination.  This visit is being conducted via phone call  - after an attmept to do on video chat - due to the COVID-19 pandemic. This patient has given me verbal consent via phone to conduct this visit, patient states they are participating from their home address. Some vital signs may be absent or patient reported.   Patient identification: identified by name, DOB, and current address.    Review of Systems:   Cardiac Risk Factors include: advanced age (>41mn, >>63women);hypertension;dyslipidemia     Objective:     Vitals: LMP  (LMP Unknown)   There is no height or weight on file to calculate BMI.  Advanced Directives 04/07/2019 08/31/2018 07/05/2018 04/19/2018 03/09/2018 02/10/2018 01/14/2018  Does Patient Have a Medical Advance Directive? No No No No No No No  Does patient want to make changes to medical advance directive? - - - - - - Yes (MAU/Ambulatory/Procedural Areas - Information given)  Would patient like information on creating a medical advance directive? - No - Patient declined - No - Patient declined No - Patient declined No - Patient declined -    Tobacco Social History   Tobacco Use  Smoking Status Former Smoker   Packs/day: 0.60   Years: 56.00   Pack years: 33.60   Types: Cigarettes   Quit date: 07/14/2016   Years since quitting: 2.7  Smokeless Tobacco Never Used  Tobacco Comment   has smoked off and on     Counseling given: Not Answered Comment: has smoked off and on   Clinical Intake:  Pre-visit preparation completed: Yes  Pain : No/denies pain     Nutritional Risks: None Diabetes: No  How often do you need to have someone help you when you read instructions, pamphlets, or other written materials from your doctor or pharmacy?: 1 - Never What is the last grade level you completed in school?: some college  Interpreter Needed?:  No  Information entered by :: Xena Propst,LPN  Past Medical History:  Diagnosis Date   Anxiety    Arthritis    hands, upper back   Asthma    COPD (chronic obstructive pulmonary disease) ([place]    History of cervical cancer    Menopausal disorder    Osteoporosis    Pneumonia 1960   Spasm of abdominal muscles of right side    intermittent   TMJ (dislocation of temporomandibular joint)    Wears dentures    partial lower   Past Surgical History:  Procedure Laterality Date   ABDOMINAL HYSTERECTOMY  1970's   bladder botox  2005   BLADDER SUSPENSION  2004   CATARACT EXTRACTION W/PHACO Right 03/09/2018   Procedure: CATARACT EXTRACTION PHACO AND INTRAOCULAR LENS PLACEMENT ([place] right;  Surgeon: KEulogio Bear MD;  [facility]  Service: Ophthalmology;  Laterality: Right;  CALL CELL 1ST   CATARACT EXTRACTION W/PHACO Left 04/19/2018   Procedure: CATARACT EXTRACTION PHACO AND INTRAOCULAR LENS PLACEMENT (IOC)  LEFT;  Surgeon: KEulogio Bear MD;  [facility] [facility]  Service: Ophthalmology;  Laterality: Left;   COLONOSCOPY WITH PROPOFOL N/A 12/13/2015   Procedure: COLONOSCOPY WITH PROPOFOL;  Surgeon: DLucilla Lame MD;  [facility]  Service: Endoscopy;  Laterality: N/A;   LOOP RECORDER INSERTION N/A 01/07/2018   Procedure: LOOP RECORDER INSERTION;  Surgeon: KDeboraha Sprang MD;  [facility];  Service: Cardiovascular;  Laterality: N/A;   POLYPECTOMY N/A 12/13/2015   Procedure: POLYPECTOMY;  Surgeon: Lucilla Lame, MD;  Location: [facility];  Service: Endoscopy;  Laterality: N/A;  SIGMOID COLON POLYPS X  5   SHOULDER ARTHROSCOPY W/ ROTATOR CUFF REPAIR Right 1998   TEE WITHOUT CARDIOVERSION N/A 01/06/2018   Procedure: TRANSESOPHAGEAL ECHOCARDIOGRAM (TEE);  Surgeon: Minna Merritts, MD;  Location: [facility];  Service: Cardiovascular;  Laterality: N/A;   TONSILLECTOMY AND ADENOIDECTOMY     Family  History  Problem Relation Age of Onset   Diabetes Mother    Heart disease Mother    Stroke Mother    Stroke Maternal Grandmother    Social History   Socioeconomic History   Marital status: Married    Spouse name: Thekla Colborn   Number of children: 1   Years of education: Not on file   Highest education level: Some college, no degree  Occupational History   Occupation: retired  Scientist, product/process development strain: Not hard at [employer]    Worry: Never true    Inability: Never true   Transportation needs    Medical: No    Non-medical: No  Tobacco Use   Smoking status: Former Smoker    Packs/day: 0.60    Years: 56.00    Pack years: 33.60    Types: Cigarettes    Quit date: 07/14/2016    Years since quitting: 2.7   Smokeless tobacco: Never Used   Tobacco comment: has smoked off and on  Substance and Sexual Activity   Alcohol use: Yes    Alcohol/week: 0.0 standard drinks    Comment: occasionally - special occasions   Drug use: No   Sexual activity: Not Currently    Birth control/protection: Post-menopausal  Lifestyle   Physical activity    Days per week: 7 days    Minutes per session: 10 min   Stress: Very much  Relationships   Social connections    Talks on phone: More than three times a week    Gets together: Once a week    Attends religious service: 1 to 4 times per year    Active member of club or organization: No    Attends meetings of clubs or organizations: Never    Relationship status: Married  Other Topics Concern   Not on file  Social History Narrative   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   atorvastatin (LIPITOR) 40 MG tablet TAKE 1 TABLET BY MOUTH ONCE A DAY AT 6 PM   citalopram (CELEXA) 20 MG  tablet Take 1 tablet (20 mg total) by mouth daily. (Patient taking differently: Take 20 mg by mouth daily. Using PRN for anxiety with travel)   clopidogrel (PLAVIX) 75 MG tablet TAKE 1 TABLET(75 MG) BY MOUTH DAILY   Cyanocobalamin 1000 MCG/ML KIT Inject 1,000 mcg as directed every 30 (thirty) days.   Emollient (EUCERIN) lotion Apply topically as needed for dry skin.   estradiol (VIVELLE-DOT) 0.1 MG/24HR patch APPLY 1 PATCH ONTO THE SKIN 2 TIMES A WEEK   guaiFENesin (MUCINEX) 600 MG 12 hr tablet Take 1 tablet (600 mg total) by mouth 2 (two) times daily.   Loratadine (CLARITIN PO) Take by mouth daily.   meclizine (ANTIVERT) 12.5 MG  tablet Take 1 tablet (12.5 mg total) by mouth 3 (three) times daily as needed for dizziness (only if needed).   montelukast (SINGULAIR) 10 MG tablet Take 1 tablet (10 mg total) by mouth at bedtime.   Multiple Vitamin (MULTIVITAMIN) tablet Take 2 tablets by mouth daily.    pantoprazole (PROTONIX) 40 MG tablet Take 1 tablet (40 mg total) by mouth daily.   SYMBICORT 160-4.5 MCG/ACT inhaler USE 2 PUFFS TWICE DAILY   No facility-administered encounter medications on file as of 04/07/2019.     Activities of Daily Living In your present state of health, do you have any difficulty performing the following activities: 04/07/2019 12/07/2018  Hearing? Y Y  Comment difficulty out of right ear -  Vision? Y Y  Comment will schedule appt with  eye center -  Difficulty concentrating or making decisions? N N  Walking or climbing stairs? Y Y  Comment hip and knee pain and lymphedema in legs -  Dressing or bathing? N N  Doing errands, shopping? N N  Preparing Food and eating ? N -  Using the Toilet? N -  In the past six months, have you accidently leaked urine? Y -  Comment wears pads -  Do you have problems with loss of bowel control? N -  Managing your Medications? N -  Managing your Finances? N -  Housekeeping or managing your Housekeeping? N -  Some  recent data might be hidden    Patient Care Team: [name], NP as PCP - General (Nurse Practitioner) Robert Bellow, MD as Consulting Physician (General Surgery) Kathrine Haddock, NP as Nurse Practitioner (Nurse Practitioner) Minna Merritts, MD as Consulting Physician (Cardiology) Clerance Lav, RN as Case Manager Greg Cutter, LCSW as Social Worker (Licensed Clinical Social Worker) De Hollingshead, [facility] as Pharmacist (Pharmacist)    Assessment:   This is a routine wellness examination for [facility].  Exercise Activities and Dietary recommendations Current Exercise Habits: The patient does not participate in regular exercise at present(walks some in the house to stay mobile), Exercise limited by: None identified  Goals     "I don't have a lot of education on resources available to me and my spouse" (pt-stated)     Current Barriers:   Limited education about Caregiver Support Resources*  Limited access to caregiver  Inability to perform IADL's independently Patient reports that she and spouse share and assist one another with performance of IADL's.   Clinical Social Work Clinical Goal(s):   Over the next 90 days, client will work with SW to address concerns related to lack of caregiver support within the home.  Interventions:  Patient interviewed and appropriate assessments performed  Implemented talk therapy during session to support patient and her needs around caregiver support. Patient reports that she has pneumonia in her right lung which has caused her a lot of anxiety and stress. LCSW provided education on deep breathing and relaxation techniques to implement into their daily routine to combat stressors   Provided education on available personal care service resources within the area   Discussed plans with patient for ongoing care management follow up and provided patient with direct contact information for care management team  Provided education on  level of care, senior centers, adult day programs, VA benefits and assistance in terms of in home care and placement, LTC and In [employer].   Patient Self Care Activities:   Attends all scheduled provider appointments  Calls provider office for  new concerns or questions  Plan:   LCSW willfollow up within 30 days and reassess social work needs  Please see past updates related to this goal by clicking on the "Past Updates" button in the selected goal      "I have a lot of medications" (pt-stated)     Current Barriers:   Knowledge Deficits related to over the counter and prescription medication use   Asks about her medications:  o Currently taking a multivitamin that contains vitamin K, and she wonders if that is OK with clopidogrel. She also asks about "spots" that have developed on her arms since starting clopidogrel; her web searches have noted many case reports of these spots. She has discussed this with cardiology and they recommended she continue clopidogrel. o Often forgets to take her atorvastatin until 8 pm, and the script says 6 pm. o Likes to eat a small serving of grapefruit juice a few days a week, wonders if this is OK with her medications o Reports not using a pill box or other adherence aids, but notes adherence to her medication regimen  Pharmacist Clinical Goal(s):   Over the next 30 days, patient will work with PharmD to address questions related to medications  Interventions:  Comprehensive medication review performed.   Counseled that vitamin K does not interact with clopidogrel. I will investigate this report of skin spots with clopidogrel. Encouraged adherence  Educated that atorvastatin does NOT have to be taken at 6 pm, nor has to be taken in the evening  Educated on grapefruit drug interactions, but that she would have to be consuming much greater quantities of grapefruit to have clinically significant medication interactions  Patient Self Care  Activities:   Self administers medications as prescribed  Initial goal documentation      "I want to know my options that are free of cost in terms of caregiving resources"     Current Barriers:   ADL IADL limitations  Limited education about Caregiver resources free of charge and available to her and spouse*  Limited access to caregiver  Clinical Social Work Clinical Goal(s):   Over the next 90 days, client will work with SW to address concerns related to lack of support within the home and lack of planning in terms of long term care  Interventions:  Patient interviewed and appropriate assessments performed  Provided patient with information about caregiver support resources within the area as well as level of care   Advised patient to check mail box for resources that was sent out to her by LCSW - UPDATE: Patient reports not receiving community resources in the mail yet but agrees to check her mailbox daily for these.   Provided education to patient/caregiver regarding level of care options.  Patient Self Care Activities:   Attends all scheduled provider appointments  Calls provider office for new concerns or questions  Plan:   Patient will check mailbox for mailed resources sent out by LCSW on 12/21/2018  LCSW willfollow up with patient telephonically within 30 days  Please see past updates related to this goal by clicking on the "Past Updates" button in the selected goal       "My breathing is bad" (pt-stated)     Current Barriers:   Former smoker, with current COPD; 1 outpatient exacerbation in the past 6 months, high symptom burden, likely COPD B; denies hx asthma diagnosis, though notes being told she had "recurrent bronchitis" as a child. Endorses significant issues with seasonal allergies. Currently  managed on Symbicort monotherapy; she had not been taking Spiriva d/t cost. Cost concerns have been alleviated d/t patient working out issues with [employer]  coverage o Patient has asked [facility] multiple times to fill prescriptions as 90 day supplies, but they will only dispense as 30.   No hx PFT on file, or that the patient is aware of  Reports currently needing albuterol up to QID, though at baseline using BID  Recent allergic concerns exacerbating breathing; recently started on montelukast; patient stopped loratidine and Mucinex, thinking she couldn't take these all in combination  Pharmacist Clinical Goal(s):   Over the next 90 days, patient will work with CCM team and primary care provider to address needs related to optimized management of chronic lung disease  Interventions:  Comprehensive medication review performed.   Educated on purpose of PFT testing, LABA vs LAMA vs ICS therapy, and preferred COPD therapy and how this would change if she additionally has evidence of restrictive lung disease. Recommended that PFTs be discussed at next PCP visit in August.   Educated that she could use loratidine, montelukast, and Mucinex in combination. Recommended to stay well hydrated to help with mucus clearance.   Patient Self Care Activities:   Self administers medications as prescribed  Calls pharmacy for medication refills  Initial goal documentation      DIET - INCREASE WATER INTAKE     Recommend continue drinking at least 6-8 glasses of water a day        Fall Risk: Fall Risk  04/07/2019 02/17/2019 12/07/2018 07/28/2018 07/22/2018  Falls in the past year? 1 1 1  No No  Number falls in past yr: 0 0 0 - -  Injury with Fall? 1 1 1  - -  Risk for fall due to : Impaired balance/gait History of fall(s) - - -  Follow up - Falls evaluation completed - - -    FALL RISK PREVENTION PERTAINING TO THE HOME:  Any stairs in or around the home? Yes  to the basement, and steps coming in and out of the house  If so, are there any without handrails? No   Home free of loose throw rugs in walkways, pet beds, electrical cords, etc? Yes    Adequate lighting in your home to reduce risk of falls? Yes   ASSISTIVE DEVICES UTILIZED TO PREVENT FALLS:  Life alert? No  Use of a cane, walker or w/c? Yes , walker if needed  Grab bars in the bathroom? No  bar in the shower she uses  Shower chair or bench in shower? Yes  built in seats Elevated toilet seat or a handicapped toilet? Yes   DME ORDERS:  DME order needed?  No   TIMED UP AND GO:  Unable to perform    Depression Screen PHQ 2/9 Scores 04/07/2019 12/21/2018 12/07/2018 01/14/2018  PHQ - 2 Score 1 0 0 2  PHQ- 9 Score - 0 0 4     Cognitive Function     6CIT Screen 01/14/2018  What Year? 0 points  What month? 0 points  What time? 0 points  Count back from 20 0 points  Months in reverse 0 points  Repeat phrase 0 points  Total Score 0    Immunization History  Administered Date(s) Administered   Influenza, High Dose Seasonal PF 06/17/2018   Influenza-Unspecified 08/09/2015, 06/18/2016, 07/17/2017   Pneumococcal Conjugate-13 09/25/2014   Pneumococcal-Unspecified 04/07/2012   Td 09/25/2014    Qualifies for Shingles Vaccine? Yes  Zostavax completed n/a.  Due for Shingrix. Education has been provided regarding the importance of this vaccine. Pt has been advised to call insurance company to determine out of pocket expense. Advised may also receive vaccine at local pharmacy or Health Dept. Verbalized acceptance and understanding.  Tdap: up to date   Flu Vaccine: up to date   Pneumococcal Vaccine: up to date   Screening Tests Health Maintenance  Topic Date Due   INFLUENZA VACCINE  05/14/2019   MAMMOGRAM  06/29/2020   COLONOSCOPY  12/12/2020   TETANUS/TDAP  09/25/2024   DEXA SCAN  Completed   Hepatitis C Screening  Completed   PNA vac Low Risk Adult  Completed    Cancer Screenings:  Colorectal Screening: Completed 12/13/2015. Repeat every 5 years  Mammogram: Completed 06/29/2018. Repeat every year;   Bone Density: Completed 08/05/2017.  Lung  Cancer Screening: (Low Dose CT Chest recommended if Age 41-80 years, 30 pack-year currently smoking OR have quit w/in 15years.) does not qualify.   Additional Screening:  Hepatitis C Screening: does qualify; Completed 10/17/2015  Vision Screening: Recommended annual ophthalmology exams for early detection of glaucoma and other disorders of the eye. Is the patient up to date with their annual eye exam?  Yes  Who is the provider or what is the name of the office in which the pt attends annual eye exams? [facility]   Dental Screening: Recommended annual dental exams for proper oral hygiene  Community Resource Referral:  CRR required this visit?  No       Plan:  I have personally reviewed and addressed the Medicare Annual Wellness questionnaire and have noted the following in the patients chart:  A. Medical and social history B. Use of alcohol, tobacco or illicit drugs  C. Current medications and supplements D. Functional ability and status E.  Nutritional status F.  Physical activity G. Advance directives H. List of other physicians I.  Hospitalizations, surgeries, and ER visits in previous 12 months J.  [place] such as hearing and vision if needed, cognitive and depression L. Referrals and appointments   In addition, I have reviewed and discussed with patient certain preventive protocols, quality metrics, and best practice recommendations. A written personalized care plan for preventive services as well as general preventive health recommendations were provided to patient. Nurse Health Advisor  Signed,    [place], Caro Hight, [place]  4/70/9628 Nurse Health Advisor   Nurse Notes: none

## 2019-04-11 ENCOUNTER — Ambulatory Visit (INDEPENDENT_AMBULATORY_CARE_PROVIDER_SITE_OTHER): Payer: Medicare Other | Admitting: *Deleted

## 2019-04-11 DIAGNOSIS — I6310 Cerebral infarction due to embolism of unspecified precerebral artery: Secondary | ICD-10-CM | POA: Diagnosis not present

## 2019-04-12 LAB — CUP PACEART REMOTE DEVICE CHECK
Date Time Interrogation Session: 20200629183934
Implantable Pulse Generator Implant Date: 20190328

## 2019-04-14 ENCOUNTER — Telehealth: Payer: Self-pay | Admitting: *Deleted

## 2019-04-14 DIAGNOSIS — Z122 Encounter for screening for malignant neoplasm of respiratory organs: Secondary | ICD-10-CM

## 2019-04-14 DIAGNOSIS — Z87891 Personal history of nicotine dependence: Secondary | ICD-10-CM

## 2019-04-14 NOTE — Telephone Encounter (Signed)
Patient has been notified that annual lung cancer screening low dose CT scan is due currently or will be in near future. Confirmed that patient is within the age range of 55-77, and asymptomatic, (no signs or symptoms of lung cancer). Patient denies illness that would prevent curative treatment for lung cancer if found. Verified smoking history, (former, quit 07/14/16, 33.6 pack year). The shared decision making visit was done 03/31/17. Patient is agreeable for CT scan being scheduled.

## 2019-04-19 ENCOUNTER — Ambulatory Visit
Admission: RE | Admit: 2019-04-19 | Discharge: 2019-04-19 | Disposition: A | Discharge status: Home / Self Care | Payer: Medicare Other | Source: Ambulatory Visit | Attending: Oncology | Admitting: Oncology

## 2019-04-19 ENCOUNTER — Ambulatory Visit (INDEPENDENT_AMBULATORY_CARE_PROVIDER_SITE_OTHER): Payer: Medicare Other

## 2019-04-19 ENCOUNTER — Other Ambulatory Visit: Payer: Self-pay

## 2019-04-19 DIAGNOSIS — Z87891 Personal history of nicotine dependence: Secondary | ICD-10-CM | POA: Diagnosis not present

## 2019-04-19 DIAGNOSIS — E538 Deficiency of other specified B group vitamins: Secondary | ICD-10-CM | POA: Diagnosis not present

## 2019-04-19 DIAGNOSIS — Z122 Encounter for screening for malignant neoplasm of respiratory organs: Secondary | ICD-10-CM | POA: Insufficient documentation

## 2019-04-19 MED ORDER — CYANOCOBALAMIN 1000 MCG/ML IJ SOLN
1000.0000 ug | Freq: Once | INTRAMUSCULAR | Status: AC
  Administered 2019-04-19: 1000 ug via INTRAMUSCULAR

## 2019-04-21 ENCOUNTER — Telehealth: Payer: Self-pay | Admitting: *Deleted

## 2019-04-21 NOTE — Progress Notes (Signed)
Carelink Summary Report / Loop Recorder 

## 2019-04-21 NOTE — Telephone Encounter (Signed)
Notified patient of LDCT lung cancer screening program results with recommendation for 3 month follow up imaging. Also notified of incidental findings noted below and is encouraged to discuss further with PCP who will receive a copy of this note and/or the CT report. Patient verbalizes understanding.   IMPRESSION: 1. Lung-RADS 4A, suspicious. Follow up low-dose chest CT without contrast in 3 months (please use the following order, "CT CHEST LCS NODULE FOLLOW-UP W/O CM") is recommended. Alternatively, PET may be considered when there is a solid component 61mm or larger. 2. Two-vessel coronary atherosclerosis. 3. Stable ectatic 4.3 cm descending thoracic aorta. 4. Small hiatal hernia.  Aortic Atherosclerosis (ICD10-I70.0) and Emphysema (ICD10-J43.9).

## 2019-04-21 NOTE — Telephone Encounter (Signed)
Noted, will further discuss with patient at upcoming visit.

## 2019-04-22 ENCOUNTER — Ambulatory Visit (INDEPENDENT_AMBULATORY_CARE_PROVIDER_SITE_OTHER): Payer: Medicare Other | Admitting: Vascular Surgery

## 2019-04-25 ENCOUNTER — Other Ambulatory Visit: Payer: Self-pay

## 2019-04-25 ENCOUNTER — Other Ambulatory Visit: Payer: Self-pay | Admitting: Nurse Practitioner

## 2019-04-25 ENCOUNTER — Ambulatory Visit: Payer: Self-pay | Admitting: Licensed Clinical Social Worker

## 2019-04-25 DIAGNOSIS — F419 Anxiety disorder, unspecified: Secondary | ICD-10-CM

## 2019-04-25 MED ORDER — ATORVASTATIN CALCIUM 40 MG PO TABS: ORAL_TABLET | ORAL | 3 refills | Status: DC

## 2019-04-25 NOTE — Chronic Care Management (AMB) (Signed)
Chronic Care Management    Clinical Social Work Follow Up Note  04/25/2019 Name: Sharon [name] MRN: [id number] DOB: 21-Jul-1946  Sharon [name] is a 73 y.o. year old female who is a primary care patient of [name], [name], NP. The CCM team was consulted for assistance with Caregiver Stress.   Review of patient status, including review of consultants reports, other relevant assessments, and collaboration with appropriate care team members and the patient's provider was performed as part of comprehensive patient evaluation and provision of chronic care management services.     Goals Addressed    . "I don't have a lot of education on resources available to me and my spouse" (pt-stated)       Current Barriers:  . Limited education about Caregiver Support Resources* . Limited access to caregiver . Inability to perform IADL's independently Patient reports that she and spouse share and assist one another with performance of IADL's.   Clinical Social Work Clinical Goal(s):  Marland Kitchen Over the next 90 days, client will work with SW to address concerns related to lack of caregiver support within the home.  Interventions: . Patient interviewed and appropriate assessments performed . Positive reinforcement provided as patient has successfully implemented appropriate self-care into her daily routine. Patient has several hobbies that she works on to combat boredom.  . Implemented talk therapy during session to support patient and her needs around caregiver support. Patient reports that she had pneumonia in her right lung which has caused her a lot of anxiety and stress. LCSW provided education on deep breathing and relaxation techniques to implement into their daily routine to combat stressors.  . Provided education on available personal care service resources within the area  . Discussed plans with patient for ongoing care management follow up and provided patient with direct contact information for care  management team . Provided education on level of care, senior centers, adult day programs, VA benefits and assistance in terms of in home care and placement, LTC and In [employer].   Patient Self Care Activities:  . Attends all scheduled provider appointments . Calls provider office for new concerns or questions  Plan:  . LCSW willfollow up within 30 days and reassess social work needs  Please see past updates related to this goal by clicking on the "Past Updates" button in the selected goal     . COMPLETED: "I want to know my options that are free of cost in terms of caregiving resources"       Current Barriers:  . ADL IADL limitations . Limited education about Caregiver resources free of charge and available to her and spouse* . Limited access to caregiver  Clinical Social Work Clinical Goal(s):  Marland Kitchen Over the next 90 days, client will work with SW to address concerns related to lack of support within the home and lack of planning in terms of long term care  Interventions: . Patient interviewed and appropriate assessments performed . Provided patient with information about caregiver support resources within the area as well as level of care  . Advised patient to check mail box for resources that was sent out to her by LCSW - UPDATE: Patient reports successfully receiving community resources in the mail yet but agrees to check her mailbox daily for these.  Marland Kitchen Provided education to patient/caregiver regarding level of care options.  Patient Self Care Activities:  . Attends all scheduled provider appointments . Calls provider office for new concerns or questions  Plan:  .  Patient will check mailbox for mailed resources sent out by LCSW on 12/21/2018 . LCSW willfollow up with patient telephonically within 30 days  Please see past updates related to this goal by clicking on the "Past Updates" button in the selected goal      Follow Up Plan: SW will follow up in one month and  place family on the wait list for [facility] [place].   [name], BSW, MSW, [facility] [email or web address] Phone: [phone]

## 2019-04-26 ENCOUNTER — Ambulatory Visit (INDEPENDENT_AMBULATORY_CARE_PROVIDER_SITE_OTHER): Payer: Medicare Other | Admitting: Vascular Surgery

## 2019-05-07 ENCOUNTER — Other Ambulatory Visit: Payer: Self-pay | Admitting: Unknown Physician Specialty

## 2019-05-07 NOTE — Telephone Encounter (Signed)
Requested Prescriptions  Pending Prescriptions Disp Refills  . budesonide-formoterol (SYMBICORT) 160-4.5 MCG/ACT inhaler [Pharmacy Med Name: BUDESONIDE/FORM 160/4.5MCG(120 INH)] 10.2 g 1    Sig: USE 2 PUFFS BY MOUTH TWICE DAILY.     There is no refill protocol information for this order    '

## 2019-05-09 ENCOUNTER — Other Ambulatory Visit: Payer: Self-pay | Admitting: Nurse Practitioner

## 2019-05-09 MED ORDER — CITALOPRAM HYDROBROMIDE 20 MG PO TABS: 20.0000 mg | ORAL_TABLET | Freq: Every day | ORAL | 3 refills | Status: DC

## 2019-05-09 MED ORDER — BUDESONIDE-FORMOTEROL FUMARATE 160-4.5 MCG/ACT IN AERO: INHALATION_SPRAY | RESPIRATORY_TRACT | 3 refills | Status: DC

## 2019-05-09 MED ORDER — ESTRADIOL 0.1 MG/24HR TD PTTW: MEDICATED_PATCH | TRANSDERMAL | 11 refills | Status: DC

## 2019-05-09 NOTE — Progress Notes (Signed)
Medication refills per request from patient.

## 2019-05-10 ENCOUNTER — Ambulatory Visit: Payer: Medicare Other | Admitting: Pharmacist

## 2019-05-10 ENCOUNTER — Telehealth: Payer: Self-pay | Admitting: Pharmacist

## 2019-05-10 DIAGNOSIS — E538 Deficiency of other specified B group vitamins: Secondary | ICD-10-CM

## 2019-05-10 DIAGNOSIS — J449 Chronic obstructive pulmonary disease, unspecified: Secondary | ICD-10-CM

## 2019-05-10 NOTE — Telephone Encounter (Signed)
Spoke with patient today; she notes that the pharmacy "couldn't fill" estradiol patches; have we received a PA request for that?  Also, isn't sure when she is due for her next B12 injection. Requests that someone calls her to schedule

## 2019-05-10 NOTE — Chronic Care Management (AMB) (Signed)
  Chronic Care Management   Follow Up Note   05/10/2019 Name: Sharon [name] MRN: [id number] DOB: 1946/01/30  Referred by: [name], NP Reason for referral : Chronic Care Management (Medication Management)   Sharon [name] is a 73 y.o. year old female who is a primary care patient of [name], [name], NP. The CCM team was consulted for assistance with chronic disease management and care coordination needs.    Contacted patient today for medication management follow up.  Review of patient status, including review of consultants reports, relevant laboratory and other test results, and collaboration with appropriate care team members and the patient's provider was performed as part of comprehensive patient evaluation and provision of chronic care management services.    Goals Addressed            This Visit's Progress     Patient Stated   . "I have a lot of medications" (pt-stated)       Current Barriers:  Marland Kitchen Knowledge Deficits related to self-management of medication refills; Notes that she had problems refilling many of her medications recently: o Was able to pick up her Symbicort, but was wondering if it could be written for a 90 day supply with a year of refills in the future o Notes that the pharmacy was unable to fill estradiol, she is unsure if a PA is required o Is not sure when she is due for her August B12 injection  Pharmacist Clinical Goal(s):  Marland Kitchen Over the next 60 days, patient will work with PharmD to address questions related to medications  Interventions: . Encouraged patient to talk with [name], NP about 90 day supplies with refills moving forward . Will collaborate with clinical staff on investigating if we have received notification that a PA is needed, and scheduling for a B12 injection visit  Patient Self Care Activities:  . Self administers medications as prescribed  Please see past updates related to this goal by clicking on the "Past Updates"  button in the selected goal          Plan:  - Will outreach patient for continued medication management in 4-6 weeks  Catie Darnelle Maffucci, PharmD Clinical Pharmacist [place] [phone]

## 2019-05-10 NOTE — Patient Instructions (Addendum)
Visit Information  Goals Addressed            This Visit's Progress     Patient Stated   . "I have a lot of medications" (pt-stated)       Current Barriers:  Marland Kitchen Knowledge Deficits related to self-management of medication refills; Notes that she had problems refilling many of her medications recently: o Was able to pick up her Symbicort, but was wondering if it could be written for a 90 day supply with a year of refills in the future o Notes that the pharmacy was unable to fill estradiol, she is unsure if a PA is required o Is not sure when she is due for her August B12 injection  Pharmacist Clinical Goal(s):  Marland Kitchen Over the next 60 days, patient will work with PharmD to address questions related to medications  Interventions: . Encouraged patient to talk with [name], NP about 90 day supplies with refills moving forward . Will collaborate with clinical staff on investigating if we have received notification that a PA is needed, and scheduling for a B12 injection visit  Patient Self Care Activities:  . Self administers medications as prescribed  Please see past updates related to this goal by clicking on the "Past Updates" button in the selected goal         The patient verbalized understanding of instructions provided today and declined a print copy of patient instruction materials.  Plan:  - Will outreach patient for continued medication management in 4-6 weeks  Catie Darnelle Maffucci, PharmD Clinical Pharmacist [place] [phone]

## 2019-05-10 NOTE — Telephone Encounter (Signed)
PA for Estradiol patches initiated and submitted via Cover My Meds. Key: [id number]

## 2019-05-15 LAB — CUP PACEART REMOTE DEVICE CHECK
Date Time Interrogation Session: 20200801184046
Implantable Pulse Generator Implant Date: 20190328

## 2019-05-16 ENCOUNTER — Ambulatory Visit (INDEPENDENT_AMBULATORY_CARE_PROVIDER_SITE_OTHER): Payer: Medicare Other | Admitting: *Deleted

## 2019-05-16 DIAGNOSIS — I6310 Cerebral infarction due to embolism of unspecified precerebral artery: Secondary | ICD-10-CM

## 2019-05-18 ENCOUNTER — Encounter: Payer: Self-pay | Admitting: Nurse Practitioner

## 2019-05-18 NOTE — Progress Notes (Signed)
Letter out

## 2019-05-19 IMAGING — MR MR HEAD W/O CM
9 of 11 series · 31 of 48 positions shown · non-contrast
Comparison: Head CT 01/04/2018

CLINICAL DATA: Slurred speech and left hand weakness

EXAM:
MRI HEAD WITHOUT CONTRAST
MRA HEAD WITHOUT CONTRAST
TECHNIQUE: Multiplanar, multiecho pulse sequences of the brain and surrounding
structures were obtained without intravenous contrast. Angiographic
images of the head were obtained using MRA technique without
contrast.

[Series 2: GRE · sagittal · 5.0mm · 0.45mm/px · 3 of 20 slices shown (1 of 2)]
[im 1/20]
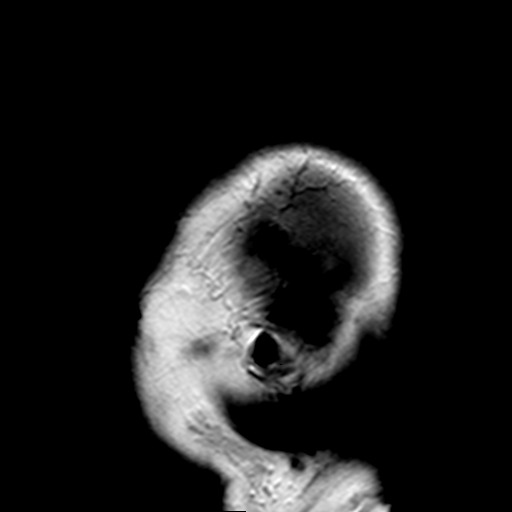
[im 10/20]
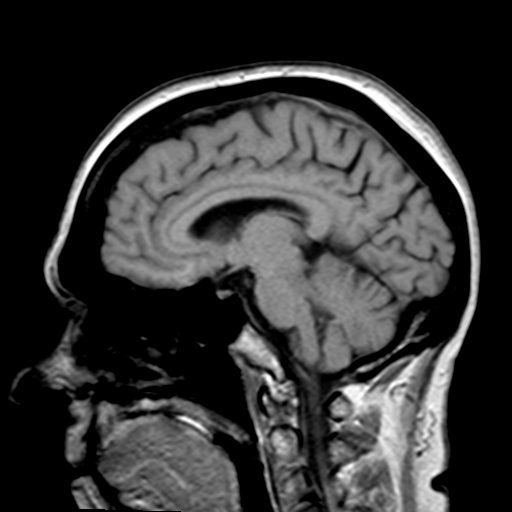
[im 20/20]
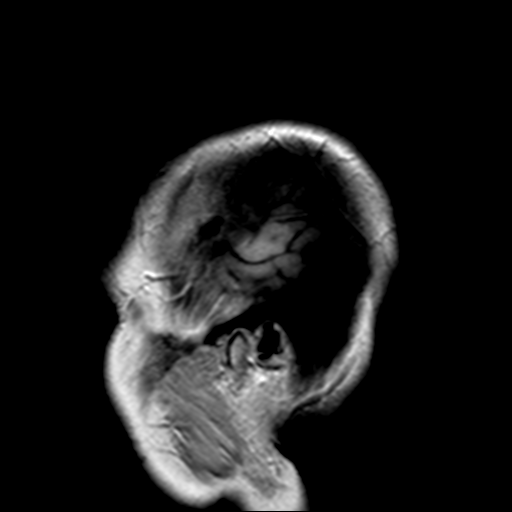

[Series 4: DWI · axial · 3.0mm · 1.80mm/px · z∈[-42,+104]mm · 5 of 50 slices shown (1 of 2)]
[im 1/50]
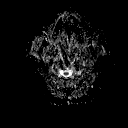
[im 13/50]
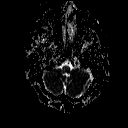
[im 25/50]
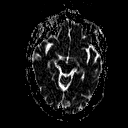
[im 37/50]
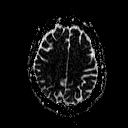
[im 50/50]
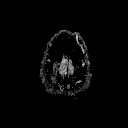

[Series 6: DWI · coronal · 3.0mm · 1.80mm/px · 5 of 43 slices shown (2 of 2)]
[im 1/43]
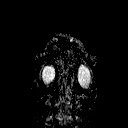
[im 11/43]
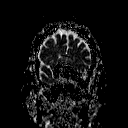
[im 22/43]
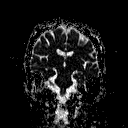
[im 32/43]
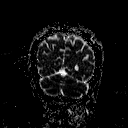
[im 43/43]
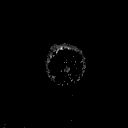

[Series 7: TOF · axial · non-contrast · 0.7mm · 0.37mm/px · z∈[-3,+31]mm · 4 of 110 slices shown]
[im 1/110]
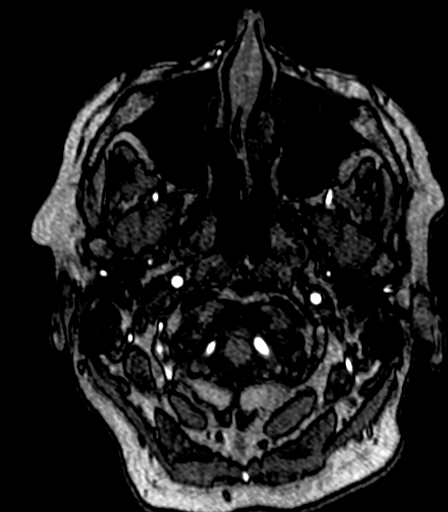
[im 20/110]
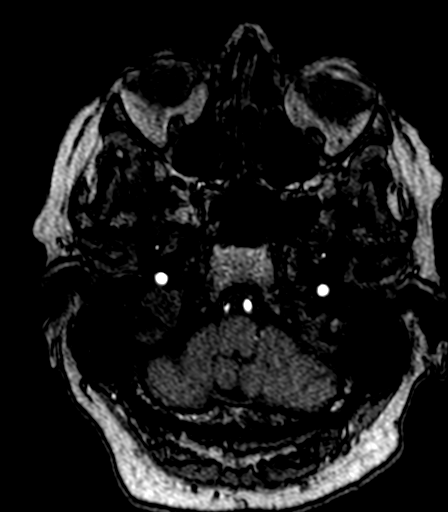
[im 30/110]
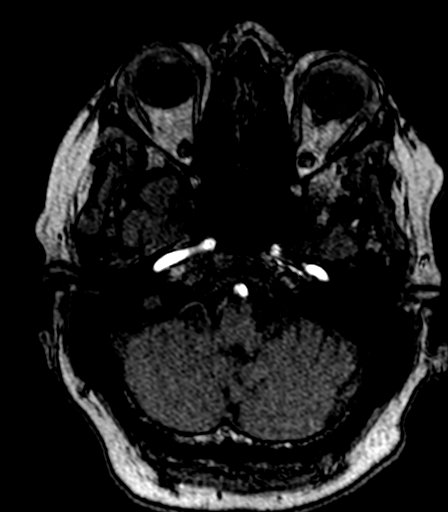
[im 50/110]
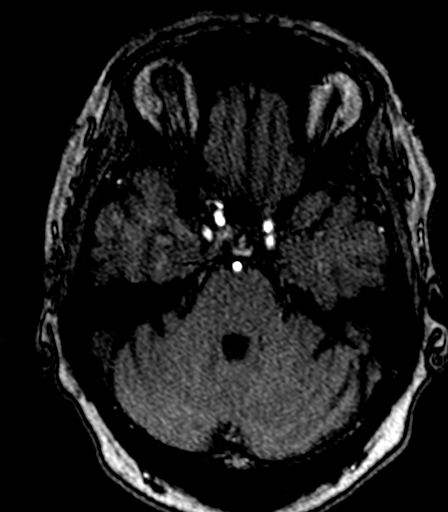

[Series 11: T2 · axial · 5.0mm · 0.45mm/px · z∈[-39,+103]mm · 2 of 23 slices shown (1 of 3)]
[im 1/23]
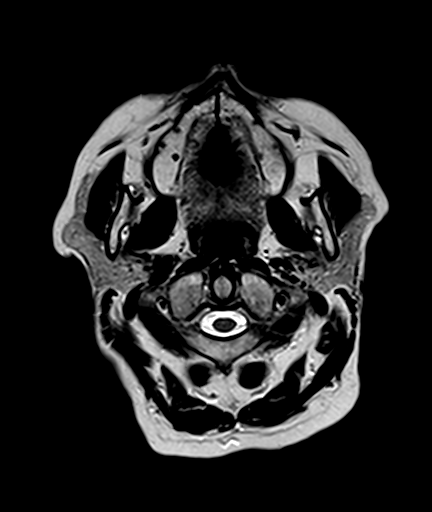
[im 23/23]
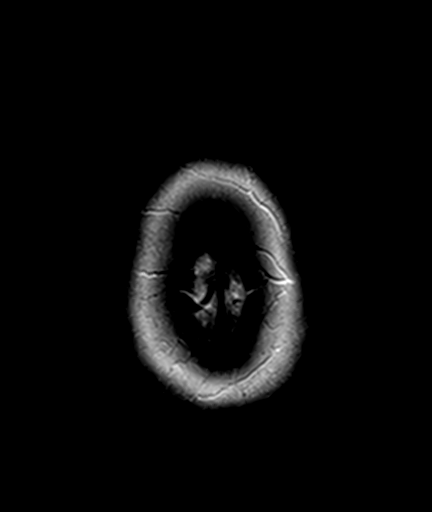

[Series 12: FLAIR · axial · 3.0mm · 0.45mm/px · z∈[-41,+105]mm · 5 of 50 slices shown]
[im 1/50]
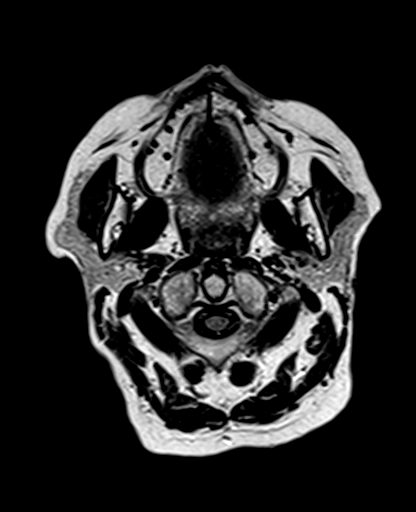
[im 13/50]
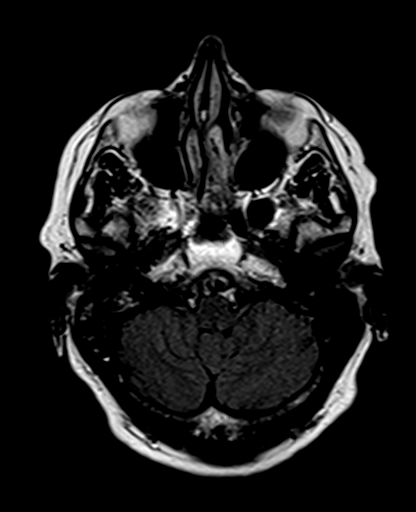
[im 25/50]
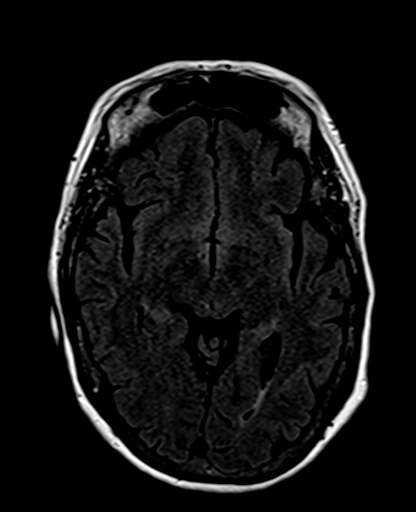
[im 37/50]
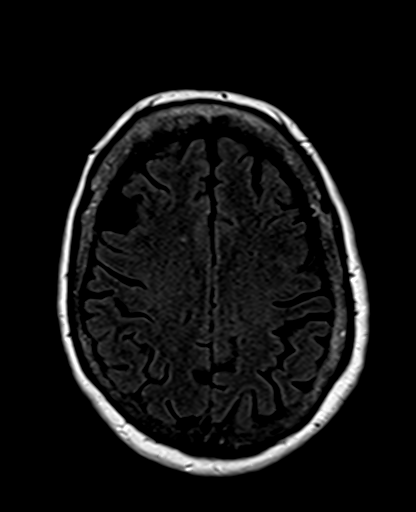
[im 50/50]
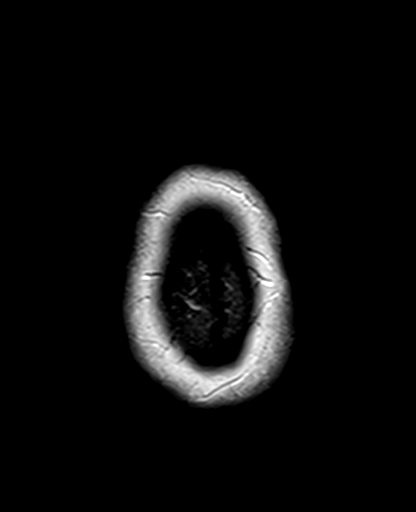

[Series 14: T2 · axial · 5.0mm · 1.20mm/px · z∈[-37,+105]mm · 2 of 23 slices shown (2 of 3)]
[im 1/23]
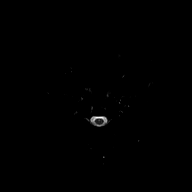
[im 23/23]
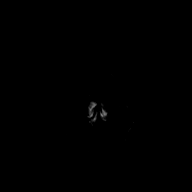

[Series 15: GRE · axial · 5.0mm · 0.45mm/px · z∈[-37,+105]mm · 2 of 23 slices shown (2 of 2)]
[im 1/23]
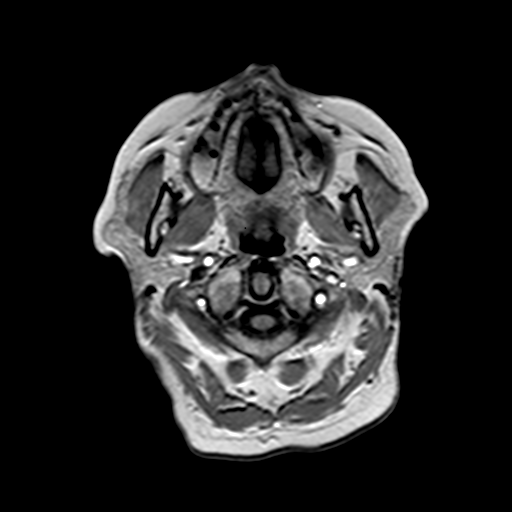
[im 23/23]
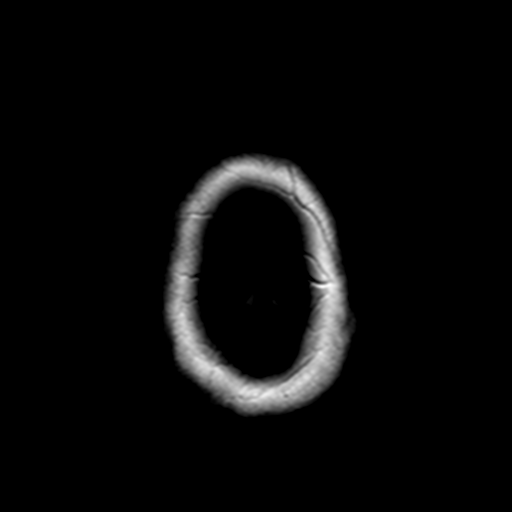

[Series 16: T2 · coronal · 5.0mm · 0.45mm/px · 3 of 26 slices shown (3 of 3)]
[im 1/26]
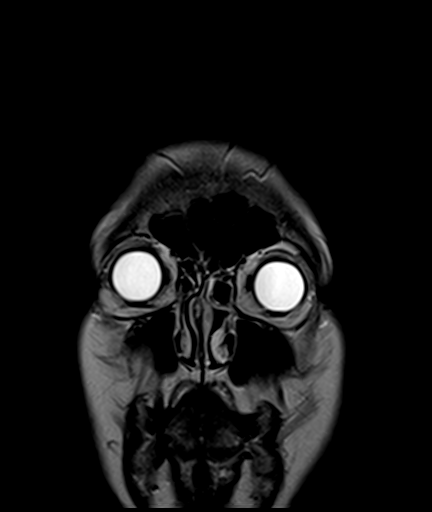
[im 13/26]
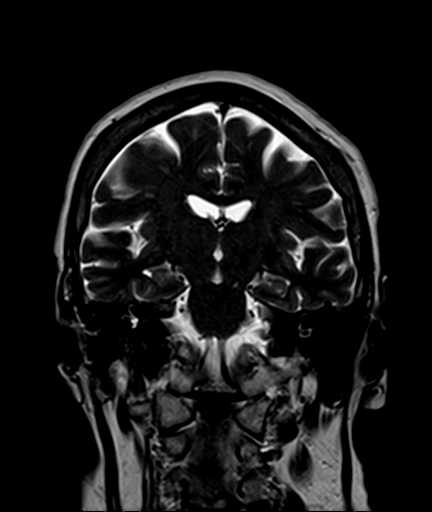
[im 26/26]
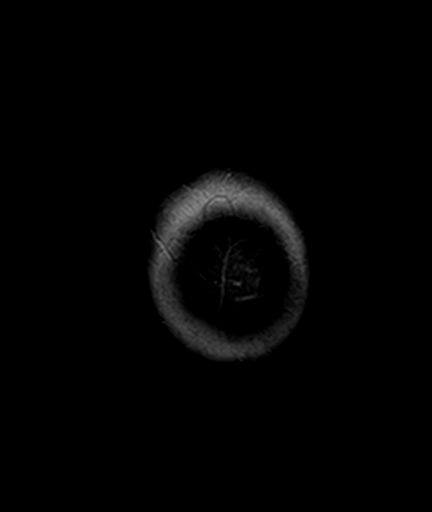

[31 of 48 positions shown; findings below may reference images not displayed]

FINDINGS: MRI HEAD FINDINGS

Brain: The midline structures are normal. There is multifocal
abnormal diffusion restriction within the right frontal lobe,
including the precentral gyrus and superior frontal gyrus/anterior
paracentral gyrus. The latter focus is in the anterior cerebral
artery distribution. The others are in the MCA distribution. Very
mild multifocal white matter hyperintensity. Some of these foci
related to the areas of acute ischemia. Others are nonspecific and
commonly seen at this age. No mass lesion. No chronic
microhemorrhage or cerebral amyloid angiopathy. No hydrocephalus,
age advanced atrophy or lobar predominant volume loss. No dural
abnormality or extra-axial collection.

Skull and upper cervical spine: The visualized skull base,
calvarium, upper cervical spine and extracranial soft tissues are
normal.

Sinuses/Orbits: Right mastoid effusion. Paranasal sinuses are clear.
Normal orbits.

MRA HEAD FINDINGS

Intracranial internal carotid arteries: Normal.

Anterior cerebral arteries: Normal.

Middle cerebral arteries: Normal right MCA. There is severe proximal
stenosis of the most anterior M2 branch of the left MCA (series 7
image 62), with normal distal flow-related enhancement. No clear
correlate for this is seen on the maximum intensity projections.

Posterior communicating arteries: Absent

Posterior cerebral arteries: Normal.

Basilar artery: Normal.

Vertebral arteries: Left dominant. Normal.

Superior cerebellar arteries: Normal.

Anterior inferior cerebellar arteries: Normal.

Posterior inferior cerebellar arteries: Normal.
IMPRESSION: 1. Multiple punctate foci of acute ischemia within the right frontal
lobe, including the precentral gyrus, which is in the right MCA
territory, and the left superior frontal 4 anterior paracentral
gyrus, both of which are in the anterior cerebral artery
distribution. The involvement of multiple vascular territories on
the right suggest a right-sided embolic source.
2. No hemorrhage or mass effect.
3. No emergent large vessel occlusion.
4. Severe stenosis of the anterior most left M2 branch of the MCA,
with normal distal flow-related enhancement..

## 2019-05-20 ENCOUNTER — Ambulatory Visit (INDEPENDENT_AMBULATORY_CARE_PROVIDER_SITE_OTHER): Payer: Medicare Other

## 2019-05-20 ENCOUNTER — Other Ambulatory Visit: Payer: Self-pay

## 2019-05-20 DIAGNOSIS — E538 Deficiency of other specified B group vitamins: Secondary | ICD-10-CM | POA: Diagnosis not present

## 2019-05-20 MED ORDER — CYANOCOBALAMIN 1000 MCG/ML IJ SOLN
1000.0000 ug | Freq: Once | INTRAMUSCULAR | Status: AC
  Administered 2019-05-20: 1000 ug via INTRAMUSCULAR

## 2019-05-25 NOTE — Progress Notes (Signed)
Carelink Summary Report / Loop Recorder 

## 2019-05-27 ENCOUNTER — Other Ambulatory Visit: Payer: Self-pay

## 2019-05-27 ENCOUNTER — Encounter: Payer: Self-pay | Admitting: Nurse Practitioner

## 2019-05-27 ENCOUNTER — Ambulatory Visit (INDEPENDENT_AMBULATORY_CARE_PROVIDER_SITE_OTHER): Payer: Medicare Other | Admitting: Nurse Practitioner

## 2019-05-27 VITALS — BP 137/73 | HR 90 | Temp 98.2°F | Ht 68.3 in | Wt 224.0 lb

## 2019-05-27 DIAGNOSIS — R21 Rash and other nonspecific skin eruption: Secondary | ICD-10-CM

## 2019-05-27 DIAGNOSIS — L03115 Cellulitis of right lower limb: Secondary | ICD-10-CM

## 2019-05-27 MED ORDER — TRIAMCINOLONE ACETONIDE 0.1 % EX CREA: 1.0000 "application " | TOPICAL_CREAM | Freq: Two times a day (BID) | CUTANEOUS | 0 refills | Status: DC

## 2019-05-27 MED ORDER — DOXYCYCLINE HYCLATE 100 MG PO TABS: 100.0000 mg | ORAL_TABLET | Freq: Two times a day (BID) | ORAL | 0 refills | Status: DC

## 2019-05-27 NOTE — Progress Notes (Signed)
BP 137/73   Pulse 90 Comment: apical  Temp 98.2 F (36.8 C) (Oral)   Ht 5' 8.3" (1.735 m)   Wt 224 lb (101.6 kg)   LMP  (LMP Unknown)   SpO2 97%   BMI 33.76 kg/m    Subjective:    Patient ID: Sharon [name], female    DOB: Dec 30, 1945, 73 y.o.   MRN: [id number]  HPI: Sharon [name] is a 73 y.o. female  Chief Complaint  Patient presents with  . Leg Swelling    right leg pain, redness and itchy. x about a week  . Tick Removal    pt states she found a tick on her back last night and removed it   LEG SWELLING: Has underlying lymphedema bilaterally L>R.  Has been unable to go for massage therapy consistently due to Covid.  Redness and edema to right leg started Wednesday.  Reports no pain, but does endorse itching.  Is able to walk on leg without issue or discomfort.   Duration: days Pain: no Bilateral:  right lower leg Onset: gradual Sudden unintentional leg jerking:   no Paresthesias:   no Decreased sensation:  no Weakness:   no Insomnia:   no Fatigue:   no Status: stable  TICK BITE Found it last night under her bra strap and removed it, posterior left shoulder..   Duration: <24 hours Location: left shoulder Onset: <24hours Itching: yes Status: stable Treatments attempted:  Fever: no Chills: no headache: no Muscle pain: no Rash: small rash  Relevant past medical, surgical, family and social history reviewed and updated as indicated. Interim medical history since our last visit reviewed. Allergies and medications reviewed and updated.  Review of Systems  Constitutional: Negative for activity change, appetite change, diaphoresis, fatigue and fever.  Respiratory: Negative for cough, chest tightness, shortness of breath and wheezing.   Cardiovascular: Positive for leg swelling. Negative for chest pain and palpitations.  Gastrointestinal: Negative for abdominal distention, abdominal pain, constipation, diarrhea, nausea and vomiting.  Skin: Positive for rash.   Neurological: Negative for dizziness, syncope, weakness, light-headedness, numbness and headaches.  Psychiatric/Behavioral: Negative.     Per HPI unless specifically indicated above     Objective:    BP 137/73   Pulse 90 Comment: apical  Temp 98.2 F (36.8 C) (Oral)   Ht 5' 8.3" (1.735 m)   Wt 224 lb (101.6 kg)   LMP  (LMP Unknown)   SpO2 97%   BMI 33.76 kg/m   Wt Readings from Last 3 Encounters:  05/27/19 224 lb (101.6 kg)  04/19/19 215 lb (97.5 kg)  03/30/19 221 lb (100.2 kg)    Physical Exam Vitals signs and nursing note reviewed.  Constitutional:      General: She is awake. She is not in acute distress.    Appearance: She is well-developed. She is not ill-appearing.  HENT:     Head: Normocephalic.     Right Ear: Hearing normal.     Left Ear: Hearing normal.     Nose: Nose normal.     Mouth/Throat:     Mouth: Mucous membranes are moist.  Eyes:     General: Lids are normal.        Right eye: No discharge.        Left eye: No discharge.     Conjunctiva/sclera: Conjunctivae normal.     Pupils: Pupils are equal, round, and reactive to light.  Neck:     Musculoskeletal: Normal range of motion and  neck supple.     Thyroid: No thyromegaly.     Vascular: No carotid bruit.  Cardiovascular:     Rate and Rhythm: Normal rate and regular rhythm.     Pulses:          Popliteal pulses are 1+ on the right side and 1+ on the left side.       Dorsalis pedis pulses are 1+ on the right side and 1+ on the left side.       Posterior tibial pulses are 1+ on the right side and 1+ on the left side.     Heart sounds: Normal heart sounds. No murmur. No gallop.   Pulmonary:     Effort: Pulmonary effort is normal. No accessory muscle usage or respiratory distress.     Breath sounds: Normal breath sounds.  Abdominal:     General: Bowel sounds are normal.     Palpations: Abdomen is soft. There is no hepatomegaly or splenomegaly.  Musculoskeletal:     Right lower leg: No edema.      Left lower leg: No edema.  Lymphadenopathy:     Cervical: No cervical adenopathy.  Skin:    General: Skin is warm and dry.     Findings: Erythema present.          Comments: Left lower extremity with erythema and warmth extending from dorsal aspect foot to mid-calf, distinct line (marked and educated patient on marking).  Palpable pulses to foot and posterior knee.  No pain with movement.  Extremity with 2+ edema to calf and foot + a few scattered blisters to area of erythema and swelling.  One small abrasion noted, crusted over, to interior left ankle.  No tenderness to touch.  Neurological:     Mental Status: She is alert and oriented to person, place, and time.  Psychiatric:        Attention and Perception: Attention normal.        Mood and Affect: Mood normal.        Behavior: Behavior normal. Behavior is cooperative.        Thought Content: Thought content normal.        Judgment: Judgment normal.     Results for orders placed or performed in visit on 05/16/19  CUP PACEART REMOTE DEVICE CHECK  Result Value Ref Range   Date Time Interrogation Session [id number]    Pulse Generator Manufacturer MERM    Pulse Gen Model DJM42 Reveal LINQ    Pulse Gen Serial Number [id number]    Clinic Name [facility]    Implantable Pulse Generator Type ICM/ILR    Implantable Pulse Generator Implant Date [id number]       Assessment & Plan:   Problem List Items Addressed This Visit      Musculoskeletal and Integument   Rash    To posterior left shoulder from tick bite.  Am treating for cellulitis with Doxycycline, which will also cover tick bite.  Triamcinolone cream sent for pruritus.  Educated on monitoring area and s/s to notify provider of.  Return for worsening or continued symptoms.        Other   Cellulitis of right leg - Primary    Acute to right lower leg.  Script for Doxycycline sent x 14 day course.  Educated patient on how to mark area of erythema daily and s/s to watch out  for, if present she is aware to return to office immediately or go to closest ER.  Recommend elevation  and rest of extremity.  Return in one week for follow-up.          Follow up plan: Return in about 1 week (around 06/03/2019).

## 2019-05-27 NOTE — Patient Instructions (Signed)

## 2019-05-27 NOTE — Assessment & Plan Note (Signed)
Acute to right lower leg.  Script for Doxycycline sent x 14 day course.  Educated patient on how to mark area of erythema daily and s/s to watch out for, if present she is aware to return to office immediately or go to closest ER.  Recommend elevation and rest of extremity.  Return in one week for follow-up.

## 2019-05-27 NOTE — Assessment & Plan Note (Signed)
To posterior left shoulder from tick bite.  Am treating for cellulitis with Doxycycline, which will also cover tick bite.  Triamcinolone cream sent for pruritus.  Educated on monitoring area and s/s to notify provider of.  Return for worsening or continued symptoms.

## 2019-05-28 ENCOUNTER — Emergency Department
Admission: EM | Admit: 2019-05-28 | Discharge: 2019-05-28 | Disposition: A | Discharge status: Home / Self Care | Payer: Medicare Other | Attending: Emergency Medicine | Admitting: Emergency Medicine

## 2019-05-28 ENCOUNTER — Other Ambulatory Visit: Payer: Self-pay

## 2019-05-28 ENCOUNTER — Ambulatory Visit
Admission: EM | Admit: 2019-05-28 | Discharge: 2019-05-28 | Disposition: A | Discharge status: Nursing Home (Includes ICF) | Payer: Medicare Other | Attending: Family Medicine | Admitting: Family Medicine

## 2019-05-28 ENCOUNTER — Emergency Department: Payer: Medicare Other

## 2019-05-28 DIAGNOSIS — M79661 Pain in right lower leg: Secondary | ICD-10-CM | POA: Diagnosis not present

## 2019-05-28 DIAGNOSIS — J449 Chronic obstructive pulmonary disease, unspecified: Secondary | ICD-10-CM | POA: Insufficient documentation

## 2019-05-28 DIAGNOSIS — L03115 Cellulitis of right lower limb: Secondary | ICD-10-CM

## 2019-05-28 DIAGNOSIS — Z87891 Personal history of nicotine dependence: Secondary | ICD-10-CM

## 2019-05-28 DIAGNOSIS — M79604 Pain in right leg: Secondary | ICD-10-CM | POA: Diagnosis present

## 2019-05-28 DIAGNOSIS — I2510 Atherosclerotic heart disease of native coronary artery without angina pectoris: Secondary | ICD-10-CM | POA: Diagnosis not present

## 2019-05-28 DIAGNOSIS — Z79899 Other long term (current) drug therapy: Secondary | ICD-10-CM | POA: Insufficient documentation

## 2019-05-28 DIAGNOSIS — Z7982 Long term (current) use of aspirin: Secondary | ICD-10-CM | POA: Insufficient documentation

## 2019-05-28 DIAGNOSIS — R2241 Localized swelling, mass and lump, right lower limb: Secondary | ICD-10-CM | POA: Diagnosis not present

## 2019-05-28 LAB — CBC WITH DIFFERENTIAL/PLATELET
Abs Immature Granulocytes: 0.02 10*3/uL (ref 0.00–0.07)
Basophils Absolute: 0 10*3/uL (ref 0.0–0.1)
Basophils Relative: 0 %
Eosinophils Absolute: 0 10*3/uL (ref 0.0–0.5)
Eosinophils Relative: 0 %
HCT: 43.4 % (ref 36.0–46.0)
Hemoglobin: 14.3 g/dL (ref 12.0–15.0)
Immature Granulocytes: 0 %
Lymphocytes Relative: 13 %
Lymphs Abs: 1.1 10*3/uL (ref 0.7–4.0)
MCH: 29.6 pg (ref 26.0–34.0)
MCHC: 32.9 g/dL (ref 30.0–36.0)
MCV: 89.9 fL (ref 80.0–100.0)
Monocytes Absolute: 0.5 10*3/uL (ref 0.1–1.0)
Monocytes Relative: 6 %
Neutro Abs: 7.2 10*3/uL (ref 1.7–7.7)
Neutrophils Relative %: 81 %
Platelets: 229 10*3/uL (ref 150–400)
RBC: 4.83 MIL/uL (ref 3.87–5.11)
RDW: 13.7 % (ref 11.5–15.5)
WBC: 9 10*3/uL (ref 4.0–10.5)
nRBC: 0 % (ref 0.0–0.2)

## 2019-05-28 LAB — COMPREHENSIVE METABOLIC PANEL
ALT: 30 U/L (ref 0–44)
AST: 33 U/L (ref 15–41)
Albumin: 4.1 g/dL (ref 3.5–5.0)
Alkaline Phosphatase: 63 U/L (ref 38–126)
Anion gap: 10 (ref 5–15)
BUN: 13 mg/dL (ref 8–23)
CO2: 22 mmol/L (ref 22–32)
Calcium: 8.8 mg/dL — ABNORMAL LOW (ref 8.9–10.3)
Chloride: 108 mmol/L (ref 98–111)
Creatinine, Ser: 0.92 mg/dL (ref 0.44–1.00)
GFR calc Af Amer: >60 mL/min (ref >60)
GFR calc non Af Amer: >60 mL/min (ref >60)
Glucose, Bld: 166 mg/dL — ABNORMAL HIGH (ref 70–99)
Potassium: 3.7 mmol/L (ref 3.5–5.1)
Sodium: 140 mmol/L (ref 135–145)
Total Bilirubin: 0.9 mg/dL (ref 0.3–1.2)
Total Protein: 7.1 g/dL (ref 6.5–8.1)

## 2019-05-28 LAB — LACTIC ACID, PLASMA
Lactic Acid, Venous: 1.7 mmol/L (ref 0.5–1.9)
Lactic Acid, Venous: 1.4 mmol/L (ref 0.5–1.9)

## 2019-05-28 LAB — PROTIME-INR
INR: 0.9 (ref 0.8–1.2)
Prothrombin Time: 12.3 seconds (ref 11.4–15.2)

## 2019-05-28 MED ORDER — VANCOMYCIN HCL IN DEXTROSE 1-5 GM/200ML-% IV SOLN
1000.0000 mg | Freq: Once | INTRAVENOUS | Status: AC
  Administered 2019-05-28: 1000 mg via INTRAVENOUS
  Filled 2019-05-28: qty 200

## 2019-05-28 MED ORDER — PREDNISONE 10 MG (21) PO TBPK: ORAL_TABLET | ORAL | 0 refills | Status: DC

## 2019-05-28 NOTE — ED Notes (Signed)
Pt educated about prescription and wheeled out to car.

## 2019-05-28 NOTE — Discharge Instructions (Addendum)
Please seek medical attention for any high fevers, chest pain, shortness of breath, change in behavior, persistent vomiting, bloody stool or any other new or concerning symptoms.  

## 2019-05-28 NOTE — ED Notes (Signed)
1st set of blood cultures drawn with kurin device and sent to lab at this time.

## 2019-05-28 NOTE — ED Notes (Signed)
Pt's husband at bedside. Pt given pillow and warm blanket. EDP Archie Balboa at bedside.

## 2019-05-28 NOTE — Discharge Instructions (Signed)
Go directly to the emergency room as discussed.  °

## 2019-05-28 NOTE — ED Notes (Signed)
Pt resting calmly in bed. Watching tv. Husband remains at bedside.

## 2019-05-28 NOTE — ED Notes (Signed)
Pt's R leg marked from appointment yesterday. Cellulitis has move up leg outside of markings. Red, swollen, painful. Dorsalis pedis pulse 2+.

## 2019-05-28 NOTE — ED Provider Notes (Signed)
[facility] ____________________________________________  Time seen: Approximately 12:07 PM  I have reviewed the triage vital signs and the nursing notes.   HISTORY  Chief Complaint Cellulitis   HPI Sharon [name] is a 73 y.o. female presenting for evaluation of right leg redness and swelling.  Patient reports she was diagnosed with cellulitis yesterday at her primary doctor's office and was treated with doxycycline.  Patient states that she has taken 3 doses of doxycycline.  Presenting today due to increased redness, swelling and discomfort.  States yesterday the area was traced with a skin marker and has had much increased redness.  Also states she was not having any pain in her leg yesterday and the swelling was not in her thigh.  States she feels swelling all the way up to her right thigh.  Chronic lymphedema to the left without change.  No drainage or oozing.  No fevers.  Continues eat and drink well.  No chest pain, shortness of breath, cough.  [name], NP: PCP   Past Medical History:  Diagnosis Date  . Anxiety   . Arthritis    hands, upper back  . Asthma   . COPD (chronic obstructive pulmonary disease) ([place])   . History of cervical cancer   . Menopausal disorder   . Osteoporosis   . Pneumonia 1960  . Spasm of abdominal muscles of right side    intermittent  . TMJ (dislocation of temporomandibular joint)   . Wears dentures    partial lower    Patient Active Problem List   Diagnosis Date Noted  . Cellulitis of right leg 05/27/2019  . Rash 05/27/2019  . COPD exacerbation ([place]) 03/11/2019  . Eustachian tube dysfunction, right 02/17/2019  . GERD (gastroesophageal reflux disease) 12/07/2018  . Vitamin B12 deficiency 12/05/2018  . Senile purpura ([place]) 07/28/2018  . Chronic venous insufficiency 07/14/2018  . Lymphedema 07/14/2018  . Aneurysm of descending thoracic aorta (HCC) 05/04/2018  . Allergic rhinitis 02/16/2018  . Aortic atherosclerosis  ([place]) 01/13/2018  . Status post placement of implantable loop recorder 01/12/2018  . Left-sided weakness 01/12/2018  . Chronic kidney disease, stage 3 ([place]) 01/12/2018  . Cerebrovascular accident (CVA) due to embolism of precerebral artery ([place])   . TIA (transient ischemic attack) 01/04/2018  . Osteoarthritis of wrist 12/09/2017  . Piriformis syndrome, left 11/25/2017  . CAD (coronary artery disease) 11/25/2017  . Caregiver stress 11/25/2017  . Menopause 08/14/2017  . Abnormal weight gain 08/14/2017  . De Quervain's tenosynovitis, right 07/17/2017  . Stress incontinence 06/19/2017  . Personal history of tobacco use, presenting hazards to health 04/01/2017  . Anxiety 03/13/2017  . Glossitis 11/14/2016  . Advanced care planning/counseling discussion 10/17/2016  . Other fecal abnormalities   . Benign neoplasm of sigmoid colon   . COPD (chronic obstructive pulmonary disease) ([place]) 10/17/2015  . Mass of upper inner quadrant of right breast 06/22/2015    Past Surgical History:  Procedure Laterality Date  . ABDOMINAL HYSTERECTOMY  1970's  . bladder botox  2005  . BLADDER SUSPENSION  2004  . CATARACT EXTRACTION W/PHACO Right 03/09/2018   Procedure: CATARACT EXTRACTION PHACO AND INTRAOCULAR LENS PLACEMENT ([place]) right;  Surgeon: Eulogio Bear, MD;  Location: [place];  Service: Ophthalmology;  Laterality: Right;  CALL CELL 1ST  . CATARACT EXTRACTION W/PHACO Left 04/19/2018   Procedure: CATARACT EXTRACTION PHACO AND INTRAOCULAR LENS PLACEMENT (IOC)  LEFT;  Surgeon: Eulogio Bear, MD;  Location: [place];  Service: Ophthalmology;  Laterality: Left;  . COLONOSCOPY WITH PROPOFOL N/A 12/13/2015   Procedure: COLONOSCOPY WITH PROPOFOL;  Surgeon: Lucilla Lame, MD;  Location: [place];  Service: Endoscopy;  Laterality: N/A;  . LOOP RECORDER INSERTION N/A 01/07/2018   Procedure: LOOP RECORDER INSERTION;  Surgeon: Deboraha Sprang, MD;  Location: [facility];  Service: Cardiovascular;  Laterality: N/A;  . POLYPECTOMY N/A 12/13/2015   Procedure: POLYPECTOMY;  Surgeon: Lucilla Lame, MD;  Location: [place];  Service: Endoscopy;  Laterality: N/A;  SIGMOID COLON POLYPS X  5  . SHOULDER ARTHROSCOPY W/ ROTATOR CUFF REPAIR Right 1998  . TEE WITHOUT CARDIOVERSION N/A 01/06/2018   Procedure: TRANSESOPHAGEAL ECHOCARDIOGRAM (TEE);  Surgeon: Minna Merritts, MD;  Location: [facility];  Service: Cardiovascular;  Laterality: N/A;  . TONSILLECTOMY AND ADENOIDECTOMY       No current facility-administered medications for this encounter.   Current Outpatient Medications:  .  albuterol (PROAIR HFA) 108 (90 Base) MCG/ACT inhaler, Inhale 2 puffs into the lungs every 6 (six) hours as needed for wheezing or shortness of breath., Disp: 8.5 Inhaler, Rfl: 12 .  aspirin 81 MG chewable tablet, Chew by mouth daily., Disp: , Rfl:  .  atorvastatin (LIPITOR) 40 MG tablet, TAKE 1 TABLET BY MOUTH ONCE A DAY AT 6 PM, Disp: 90 tablet, Rfl: 3 .  budesonide-formoterol (SYMBICORT) 160-4.5 MCG/ACT inhaler, USE 2 PUFFS BY MOUTH TWICE DAILY., Disp: 10.2 g, Rfl: 3 .  citalopram (CELEXA) 20 MG tablet, Take 1 tablet (20 mg total) by mouth daily., Disp: 90 tablet, Rfl: 3 .  clopidogrel (PLAVIX) 75 MG tablet, TAKE 1 TABLET(75 MG) BY MOUTH DAILY, Disp: 90 tablet, Rfl: 2 .  Cyanocobalamin 1000 MCG/ML KIT, Inject 1,000 mcg as directed every 30 (thirty) days., Disp: , Rfl:  .  doxycycline (VIBRA-TABS) 100 MG tablet, Take 1 tablet (100 mg total) by mouth 2 (two) times daily for 14 days., Disp: 28 tablet, Rfl: 0 .  estradiol (VIVELLE-DOT) 0.1 MG/24HR patch, APPLY 1 PATCH ONTO THE SKIN 2 TIMES A WEEK, Disp: 8 patch, Rfl: 11 .  montelukast (SINGULAIR) 10 MG tablet, Take 1 tablet (10 mg total) by mouth at bedtime., Disp: 30 tablet, Rfl: 3 .  triamcinolone cream (KENALOG) 0.1 %, Apply 1 application topically 2 (two) times daily., Disp: 30 g, Rfl: 0 .  Emollient (EUCERIN) lotion, Apply  topically as needed for dry skin., Disp: , Rfl:  .  guaiFENesin (MUCINEX) 600 MG 12 hr tablet, Take 1 tablet (600 mg total) by mouth 2 (two) times daily. (Patient not taking: Reported on 05/10/2019), Disp: 30 tablet, Rfl: 0 .  Loratadine (CLARITIN PO), Take by mouth daily., Disp: , Rfl:  .  meclizine (ANTIVERT) 12.5 MG tablet, Take 1 tablet (12.5 mg total) by mouth 3 (three) times daily as needed for dizziness (only if needed)., Disp: 30 tablet, Rfl: 0 .  Multiple Vitamin (MULTIVITAMIN) tablet, Take 2 tablets by mouth daily. , Disp: , Rfl:  .  pantoprazole (PROTONIX) 40 MG tablet, Take 1 tablet (40 mg total) by mouth daily., Disp: 90 tablet, Rfl: 3  Allergies Baclofen, Librium [chlordiazepoxide], Bactrim [sulfamethoxazole-trimethoprim], Ibuprofen, Latex, Naprosyn [naproxen], and Other  Family History  Problem Relation Age of Onset  . Diabetes Mother   . Heart disease Mother   . Stroke Mother   . Stroke Maternal Grandmother     Social History Social History   Tobacco Use  . Smoking status: Former Smoker    Packs/day: 0.60    Years: 56.00  Pack years: 33.60    Types: Cigarettes    Quit date: 07/14/2016    Years since quitting: 2.8  . Smokeless tobacco: Never Used  . Tobacco comment: has smoked off and on  Substance Use Topics  . Alcohol use: Yes    Alcohol/week: 0.0 standard drinks    Comment: occasionally - special occasions  . Drug use: No    Review of Systems Constitutional: No fever ENT: No sore throat. Cardiovascular: Denies chest pain. Respiratory: Denies shortness of breath. Gastrointestinal: No abdominal pain.  Musculoskeletal: Negative for back pain. Skin: Positive for rash.   ____________________________________________   PHYSICAL EXAM:  VITAL SIGNS: ED Triage Vitals  Enc Vitals Group     BP 05/28/19 1136 133/74     Pulse Rate 05/28/19 1133 (!) 110     Resp 05/28/19 1133 18     Temp 05/28/19 1133 98 F (36.7 C)     Temp Source 05/28/19 1133 Oral      SpO2 05/28/19 1133 96 %     Weight 05/28/19 1137 220 lb (99.8 kg)     Height --      Head Circumference --      Peak Flow --      Pain Score 05/28/19 1137 3     Pain Loc --      Pain Edu? --      Excl. in [place]? --     Constitutional: Alert and oriented. Well appearing and in no acute distress. ENT      Head: Normocephalic and atraumatic. Cardiovascular: Normal rate, regular rhythm. Grossly normal heart sounds.  Good peripheral circulation. Respiratory: Normal respiratory effort without tachypnea nor retractions. Breath sounds are clear and equal bilaterally. No wheezes, rales, rhonchi. Musculoskeletal:Bilateral pedal pulses equal and easily palpated. Except: Diffuse erythema and induration to right lower leg circumferential and extending to right knee, edema present to knee, no drainage, diffuse right lower leg tenderness. Neurologic:  Normal speech and language.   Skin:  Skin is warm, dry. Psychiatric: Mood and affect are normal. Speech and behavior are normal. Patient exhibits appropriate insight and judgment   ___________________________________________   LABS (all labs ordered are listed, but only abnormal results are displayed)  Labs Reviewed - No data to display ____________________________________________   PROCEDURES Procedures    INITIAL IMPRESSION / ASSESSMENT AND PLAN / ED COURSE  Pertinent labs & imaging results that were available during my care of the patient were reviewed by me and considered in my medical decision making (see chart for details).  Patient presenting for increasing redness and swelling after being diagnosed with right leg cellulitis.  Patient with diffuse cellulitis to right leg with increased swelling.  Recommend further evaluation in emergency room at this time and potential ultrasound imaging.  Patient states that she will go directly to [place] triage nurse called and given report.  Stable at discharge.  ____________________________________________   FINAL CLINICAL IMPRESSION(S) / ED DIAGNOSES  Final diagnoses:  Cellulitis of leg, right     ED Discharge Orders    None       Note: This dictation was prepared with Dragon dictation along with smaller phrase technology. Any transcriptional errors that result from this process are unintentional.         Marylene Land, NP 05/28/19 1344

## 2019-05-28 NOTE — ED Provider Notes (Signed)
[facility] Emergency Department Provider Note  ____________________________________________   I have reviewed the triage vital signs and the nursing notes.   HISTORY  Chief Complaint Cellulitis   History limited by: Not Limited   HPI Sharon [name] is a 73 y.o. female who presents to the emergency department today because of concerns for right leg pain, swelling and redness.  Patient states that the symptoms started a few days ago.  Saw primary care yesterday and was prescribed doxycycline for presumed cellulitis.  She is now taken 3 doses of the doxycycline.  Was told that if the redness got worse that she should be seen again.  The redness has extended up her shin.  She is also having some pain to her calf back of her thigh.  She denies any trauma to her leg.  Denies any fevers.   Records reviewed. Per medical record review patient has a history of COPD, asthma.   Past Medical History:  Diagnosis Date  . Anxiety   . Arthritis    hands, upper back  . Asthma   . COPD (chronic obstructive pulmonary disease) ([place])   . History of cervical cancer   . Menopausal disorder   . Osteoporosis   . Pneumonia 1960  . Spasm of abdominal muscles of right side    intermittent  . TMJ (dislocation of temporomandibular joint)   . Wears dentures    partial lower    Patient Active Problem List   Diagnosis Date Noted  . Cellulitis of right leg 05/27/2019  . Rash 05/27/2019  . COPD exacerbation ([place]) 03/11/2019  . Eustachian tube dysfunction, right 02/17/2019  . GERD (gastroesophageal reflux disease) 12/07/2018  . Vitamin B12 deficiency 12/05/2018  . Senile purpura ([place]) 07/28/2018  . Chronic venous insufficiency 07/14/2018  . Lymphedema 07/14/2018  . Aneurysm of descending thoracic aorta (HCC) 05/04/2018  . Allergic rhinitis 02/16/2018  . Aortic atherosclerosis ([place]) 01/13/2018  . Status post placement of implantable loop recorder 01/12/2018  . Left-sided  weakness 01/12/2018  . Chronic kidney disease, stage 3 ([place]) 01/12/2018  . Cerebrovascular accident (CVA) due to embolism of precerebral artery ([place])   . TIA (transient ischemic attack) 01/04/2018  . Osteoarthritis of wrist 12/09/2017  . Piriformis syndrome, left 11/25/2017  . CAD (coronary artery disease) 11/25/2017  . Caregiver stress 11/25/2017  . Menopause 08/14/2017  . Abnormal weight gain 08/14/2017  . De Quervain's tenosynovitis, right 07/17/2017  . Stress incontinence 06/19/2017  . Personal history of tobacco use, presenting hazards to health 04/01/2017  . Anxiety 03/13/2017  . Glossitis 11/14/2016  . Advanced care planning/counseling discussion 10/17/2016  . Other fecal abnormalities   . Benign neoplasm of sigmoid colon   . COPD (chronic obstructive pulmonary disease) ([place]) 10/17/2015  . Mass of upper inner quadrant of right breast 06/22/2015    Past Surgical History:  Procedure Laterality Date  . ABDOMINAL HYSTERECTOMY  1970's  . bladder botox  2005  . BLADDER SUSPENSION  2004  . CATARACT EXTRACTION W/PHACO Right 03/09/2018   Procedure: CATARACT EXTRACTION PHACO AND INTRAOCULAR LENS PLACEMENT ([place]) right;  Surgeon: Eulogio Bear, MD;  Location: [place];  Service: Ophthalmology;  Laterality: Right;  CALL CELL 1ST  . CATARACT EXTRACTION W/PHACO Left 04/19/2018   Procedure: CATARACT EXTRACTION PHACO AND INTRAOCULAR LENS PLACEMENT (IOC)  LEFT;  Surgeon: Eulogio Bear, MD;  Location: [place];  Service: Ophthalmology;  Laterality: Left;  . COLONOSCOPY WITH PROPOFOL N/A 12/13/2015   Procedure: COLONOSCOPY WITH  PROPOFOL;  Surgeon: Lucilla Lame, MD;  Location: [place];  Service: Endoscopy;  Laterality: N/A;  . LOOP RECORDER INSERTION N/A 01/07/2018   Procedure: LOOP RECORDER INSERTION;  Surgeon: Deboraha Sprang, MD;  Location: [facility];  Service: Cardiovascular;  Laterality: N/A;  . POLYPECTOMY N/A 12/13/2015   Procedure:  POLYPECTOMY;  Surgeon: Lucilla Lame, MD;  Location: [place];  Service: Endoscopy;  Laterality: N/A;  SIGMOID COLON POLYPS X  5  . SHOULDER ARTHROSCOPY W/ ROTATOR CUFF REPAIR Right 1998  . TEE WITHOUT CARDIOVERSION N/A 01/06/2018   Procedure: TRANSESOPHAGEAL ECHOCARDIOGRAM (TEE);  Surgeon: Minna Merritts, MD;  Location: [facility];  Service: Cardiovascular;  Laterality: N/A;  . TONSILLECTOMY AND ADENOIDECTOMY      Prior to Admission medications   Medication Sig Start Date End Date Taking? Authorizing Provider  albuterol (PROAIR HFA) 108 (90 Base) MCG/ACT inhaler Inhale 2 puffs into the lungs every 6 (six) hours as needed for wheezing or shortness of breath. 09/29/18   [name] T, NP  aspirin 81 MG chewable tablet Chew by mouth daily.    [provider]  atorvastatin (LIPITOR) 40 MG tablet TAKE 1 TABLET BY MOUTH ONCE A DAY AT 6 PM 04/25/19   [name], Jolene T, NP  budesonide-formoterol (SYMBICORT) 160-4.5 MCG/ACT inhaler USE 2 PUFFS BY MOUTH TWICE DAILY. 05/09/19   [name], Henrine Screws T, NP  citalopram (CELEXA) 20 MG tablet Take 1 tablet (20 mg total) by mouth daily. 05/09/19   [name], Henrine Screws T, NP  clopidogrel (PLAVIX) 75 MG tablet TAKE 1 TABLET(75 MG) BY MOUTH DAILY 04/01/19   [name], Jolene T, NP  Cyanocobalamin 1000 MCG/ML KIT Inject 1,000 mcg as directed every 30 (thirty) days.    [provider]  doxycycline (VIBRA-TABS) 100 MG tablet Take 1 tablet (100 mg total) by mouth 2 (two) times daily for 14 days. 05/27/19 06/10/19  [name], Henrine Screws T, NP  Emollient (EUCERIN) lotion Apply topically as needed for dry skin.    [provider]  estradiol (VIVELLE-DOT) 0.1 MG/24HR patch APPLY 1 PATCH ONTO THE SKIN 2 TIMES A WEEK 05/09/19   [name], Jolene T, NP  guaiFENesin (MUCINEX) 600 MG 12 hr tablet Take 1 tablet (600 mg total) by mouth 2 (two) times daily. Patient not taking: Reported on 05/10/2019 10/28/18   Volney American, PA-C  Loratadine (CLARITIN PO) Take  by mouth daily.    [provider]  meclizine (ANTIVERT) 12.5 MG tablet Take 1 tablet (12.5 mg total) by mouth 3 (three) times daily as needed for dizziness (only if needed). 02/17/19   [name], Jolene T, NP  montelukast (SINGULAIR) 10 MG tablet Take 1 tablet (10 mg total) by mouth at bedtime. 03/11/19   [name] T, NP  Multiple Vitamin (MULTIVITAMIN) tablet Take 2 tablets by mouth daily.     [provider]  pantoprazole (PROTONIX) 40 MG tablet Take 1 tablet (40 mg total) by mouth daily. 09/29/18   [name], Henrine Screws T, NP  triamcinolone cream (KENALOG) 0.1 % Apply 1 application topically 2 (two) times daily. 05/27/19   [name] T, NP    Allergies Baclofen, Librium [chlordiazepoxide], Bactrim [sulfamethoxazole-trimethoprim], Ibuprofen, Latex, Naprosyn [naproxen], and Other  Family History  Problem Relation Age of Onset  . Diabetes Mother   . Heart disease Mother   . Stroke Mother   . Stroke Maternal Grandmother     Social History Social History   Tobacco Use  . Smoking status: Former Smoker    Packs/day:  0.60    Years: 56.00    Pack years: 33.60    Types: Cigarettes    Quit date: 07/14/2016    Years since quitting: 2.8  . Smokeless tobacco: Never Used  . Tobacco comment: has smoked off and on  Substance Use Topics  . Alcohol use: Yes    Alcohol/week: 0.0 standard drinks    Comment: occasionally - special occasions  . Drug use: No    Review of Systems Constitutional: No fever/chills Eyes: No visual changes. ENT: No sore throat. Cardiovascular: Denies chest pain. Respiratory: Denies shortness of breath. Gastrointestinal: No abdominal pain.  No nausea, no vomiting.  No diarrhea.   Genitourinary: Negative for dysuria. Musculoskeletal: Positive for pain and swelling to right lower leg Skin: Positive for redness to right lower leg Neurological: Negative for headaches, focal weakness or  numbness.  ____________________________________________   PHYSICAL EXAM:  VITAL SIGNS: ED Triage Vitals  Enc Vitals Group     BP 05/28/19 1405 (!) 144/67     Pulse Rate 05/28/19 1405 (!) 126     Resp 05/28/19 1405 20     Temp 05/28/19 1405 98 F (36.7 C)     Temp Source 05/28/19 1405 Oral     SpO2 05/28/19 1405 91 %     Weight 05/28/19 1406 220 lb (99.8 kg)     Height 05/28/19 1406 _0  (1.727 m)     Head Circumference --      Peak Flow --      Pain Score 05/28/19 1406 4   Constitutional: Alert and oriented.  Eyes: Conjunctivae are normal.  ENT      Head: Normocephalic and atraumatic.      Nose: No congestion/rhinnorhea.      Mouth/Throat: Mucous membranes are moist.      Neck: No stridor. Hematological/Lymphatic/Immunilogical: No cervical lymphadenopathy. Cardiovascular: Normal rate, regular rhythm.  No murmurs, rubs, or gallops.  Respiratory: Normal respiratory effort without tachypnea nor retractions. Breath sounds are clear and equal bilaterally. No wheezes/rales/rhonchi. Gastrointestinal: Soft and non tender. No rebound. No guarding.  Genitourinary: Deferred Musculoskeletal: Erythema, swelling and some warmth noted to right lower leg Neurologic:  Normal speech and language. No gross focal neurologic deficits are appreciated.  Skin:  Skin is warm, dry and intact. No rash noted. Psychiatric: Mood and affect are normal. Speech and behavior are normal. Patient exhibits appropriate insight and judgment.  ____________________________________________    LABS (pertinent positives/negatives)  Lactic 1.7 CBC wbc 9.0, hgb 14.3, plt 229 CMP wnl except glu 166, ca 8.8  ____________________________________________   EKG  None  ____________________________________________    RADIOLOGY  [place] right lower extremity  No DVT ____________________________________________   PROCEDURES  Procedures  ____________________________________________   INITIAL IMPRESSION  / ASSESSMENT AND PLAN / ED COURSE  Pertinent labs & imaging results that were available during my care of the patient were reviewed by me and considered in my medical decision making (see chart for details).   Patient presented to the emergency department today because of concerns for right lower leg swelling, erythema and discomfort.  On exam she does have swelling and erythema to the right lower leg.  Is consistent with cellulitis.  Did obtain ultrasound to evaluate for possible DVT.  This was negative.  At this point given that the patient is only been on antibiotics for roughly 24 hours I do not think she is truly failing outpatient antibiotics.  Did discuss with patient that we would give her a short course of steroids  to help with erythema and discomfort.  ____________________________________________   FINAL CLINICAL IMPRESSION(S) / ED DIAGNOSES  Final diagnoses:  Cellulitis of right lower extremity     Note: This dictation was prepared with Dragon dictation. Any transcriptional errors that result from this process are unintentional     Nance Pear, MD 05/28/19 1807

## 2019-05-28 NOTE — ED Triage Notes (Signed)
Pt seen her pcp yesterday and was seen for cellulitis of her right leg and she did mark her leg with a pen and now the redness and swelling has spread. Was instructed to go to the ER but came here instead. Was given doxycycline and did take it this morning. Very painful today and wasn't hurting yesterday.

## 2019-05-28 NOTE — ED Triage Notes (Signed)
Pt arrive to ED POV from [place] for cellulitis on R lower leg. Noticed it Wednesday. Started PO antibiotics yesterday. Leg is red up almost up to knee.

## 2019-05-30 ENCOUNTER — Ambulatory Visit: Payer: Self-pay | Admitting: Licensed Clinical Social Worker

## 2019-05-30 ENCOUNTER — Telehealth: Payer: Self-pay

## 2019-05-30 NOTE — Chronic Care Management (AMB) (Signed)
  Care Management   Follow Up Note   05/30/2019 Name: Sharon [name] MRN: [id number] DOB: 1945/12/24  Referred by: [name], NP Reason for referral : [place] is a 73 y.o. year old female who is a primary care patient of [name], [name], NP. The care management team was consulted for assistance with care management and care coordination needs.    Review of patient status, including review of consultants reports, relevant laboratory and other test results, and collaboration with appropriate care team members and the patient's provider was performed as part of comprehensive patient evaluation and provision of chronic care management services.    LCSW completed CCM outreach attempt today but was unable to reach patient/spouse successfully. A HIPPA compliant voice message was left encouraging patient to return call once available. LCSW is aware of current hospitalization. LCSW completed referral for [facility] [place] on 05/30/2019 and placed patient on wait list for services. LCSW rescheduled CCM SW appointment as well.  A HIPPA compliant phone message was left for the patient providing contact information and requesting a return call.   [name], BSW, MSW, [facility] Care Management [email or web address] Phone: [phone]

## 2019-06-02 ENCOUNTER — Other Ambulatory Visit: Payer: Self-pay

## 2019-06-02 ENCOUNTER — Encounter: Payer: Self-pay | Admitting: Nurse Practitioner

## 2019-06-02 ENCOUNTER — Ambulatory Visit (INDEPENDENT_AMBULATORY_CARE_PROVIDER_SITE_OTHER): Payer: Medicare Other | Admitting: Nurse Practitioner

## 2019-06-02 DIAGNOSIS — L03115 Cellulitis of right lower limb: Secondary | ICD-10-CM | POA: Diagnosis not present

## 2019-06-02 LAB — CULTURE, BLOOD (ROUTINE X 2)
Culture: NO GROWTH
Culture: NO GROWTH
Special Requests: ADEQUATE
Special Requests: ADEQUATE

## 2019-06-02 MED ORDER — CEPHALEXIN 500 MG PO CAPS: 500.0000 mg | ORAL_CAPSULE | Freq: Four times a day (QID) | ORAL | 0 refills | Status: AC

## 2019-06-02 NOTE — Progress Notes (Signed)
LMP  (LMP Unknown)    Subjective:    Patient ID: Sharon [name], female    DOB: 12-17-1945, 73 y.o.   MRN: [id number]  HPI: Sharon [name] is a 73 y.o. female  Chief Complaint  Patient presents with  . Cellulitis    . This visit was completed via Doximity due to the restrictions of the COVID-19 pandemic. All issues as above were discussed and addressed. Physical exam was done as above through visual confirmation on Doximity. If it was felt that the patient should be evaluated in the office, they were directed there. The patient verbally consented to this visit. . Location of the patient: home . Location of the provider: home . Those involved with this call:  . Provider: [name], DNP . CMA: Yvonna Alanis, CMA . Front Desk/Registration: Jill Side  . Time spent on call: 15 minutes with patient face to face via video conference. More than 50% of this time was spent in counseling and coordination of care. 10 minutes total spent in review of patient's record and preparation of their chart.  . I verified patient identity using two factors (patient name and date of birth). Patient consents verbally to being seen via telemedicine visit today.   CELLULITIS RIGHT LEG: Has underlying lymphedema bilaterally L>R.  Has been unable to go for massage therapy consistently due to Covid, she is going to call and see if they are open yet. Redness and edema to right leg started August 12th.  Started on Doxycyline on 05/27/19 for cellulitis.    Did have ER visit on 05/28/19 due to increased erythema and edema (had been marking leg as instructed by provider), with imaging noting no DVT and was told to continue current regimen + they added some Prednisone.  Labs in hospital setting WNL and was given a dose of Vancomycin.  States redness has decreased, has been staying off it with her husband encouraging her and reminding her to rest.  States no pain when walking on it, but occasional pain at night.   Has some warmth and edema continuing.  Is followed by vascular, Dr. Lucky Cowboy, and scheduled to see him next September 21st.   Duration: days Pain: occasional mild at night and none while walking on area. Bilateral:  right lower leg Onset: gradual Sudden unintentional leg jerking:   no Paresthesias:   no Decreased sensation:  no Weakness:   no Insomnia:   no Fatigue:   no Status: stable  Relevant past medical, surgical, family and social history reviewed and updated as indicated. Interim medical history since our last visit reviewed. Allergies and medications reviewed and updated.  Review of Systems  Constitutional: Negative for activity change, appetite change, diaphoresis, fatigue and fever.  Respiratory: Negative for cough, chest tightness, shortness of breath and wheezing.   Cardiovascular: Positive for leg swelling. Negative for chest pain and palpitations.  Gastrointestinal: Negative for abdominal distention, abdominal pain, constipation, diarrhea, nausea and vomiting.  Skin: Negative for rash.  Neurological: Negative for dizziness, syncope, weakness, light-headedness, numbness and headaches.  Psychiatric/Behavioral: Negative.     Per HPI unless specifically indicated above     Objective:    LMP  (LMP Unknown)   Wt Readings from Last 3 Encounters:  05/28/19 220 lb (99.8 kg)  05/28/19 220 lb (99.8 kg)  05/27/19 224 lb (101.6 kg)    Physical Exam Vitals signs and nursing note reviewed.  Constitutional:      General: She is awake. She is not in  acute distress.    Appearance: She is well-developed. She is not ill-appearing.  HENT:     Head: Normocephalic.     Right Ear: Hearing normal.     Left Ear: Hearing normal.  Eyes:     General: Lids are normal.        Right eye: No discharge.        Left eye: No discharge.     Conjunctiva/sclera: Conjunctivae normal.  Neck:     Musculoskeletal: Normal range of motion.  Pulmonary:     Effort: Pulmonary effort is normal. No  accessory muscle usage or respiratory distress.  Musculoskeletal:     Right lower leg: Edema present.     Left lower leg: Edema present.  Skin:    Comments: Left lower extremity with erythema extending from dorsal aspect foot to mid-calf (appears slightly less red than previous exam). No pain with movement reported. Edema present BLE.  Patient reports warmth on self palpation to RLE, but no tenderness.  Neurological:     Mental Status: She is alert and oriented to person, place, and time.  Psychiatric:        Attention and Perception: Attention normal.        Mood and Affect: Mood normal.        Behavior: Behavior normal. Behavior is cooperative.        Thought Content: Thought content normal.        Judgment: Judgment normal.    Estimated Creatinine Clearance: 67.3 mL/min (by C-G formula based on SCr of 0.92 mg/dL).   Results for orders placed or performed during the hospital encounter of 05/28/19  Culture, blood (Routine x 2)   Specimen: BLOOD  Result Value Ref Range   Specimen Description BLOOD BLOOD RIGHT HAND    Special Requests      BOTTLES DRAWN AEROBIC AND ANAEROBIC Blood Culture adequate volume   Culture      NO GROWTH 5 DAYS Performed at [facility], [address] [place], [place] [address]    Report Status 06/02/2019 FINAL   Culture, blood (Routine x 2)   Specimen: BLOOD  Result Value Ref Range   Specimen Description BLOOD LAC    Special Requests      BOTTLES DRAWN AEROBIC AND ANAEROBIC Blood Culture adequate volume   Culture      NO GROWTH 5 DAYS Performed at [facility], [address] [place], [place] [address]    Report Status 06/02/2019 FINAL   Comprehensive metabolic panel  Result Value Ref Range   Sodium 140 135 - 145 mmol/L   Potassium 3.7 3.5 - 5.1 mmol/L   Chloride 108 98 - 111 mmol/L   CO2 22 22 - 32 mmol/L   Glucose, Bld 166 (H) 70 - 99 mg/dL   BUN 13 8 - 23 mg/dL   Creatinine, Ser 0.92 0.44 - 1.00 mg/dL   Calcium 8.8  (L) 8.9 - 10.3 mg/dL   Total Protein 7.1 6.5 - 8.1 g/dL   Albumin 4.1 3.5 - 5.0 g/dL   AST 33 15 - 41 U/L   ALT 30 0 - 44 U/L   Alkaline Phosphatase 63 38 - 126 U/L   Total Bilirubin 0.9 0.3 - 1.2 mg/dL   GFR calc non Af Amer >60 >60 mL/min   GFR calc Af Amer >60 >60 mL/min   Anion gap 10 5 - 15  Lactic acid, plasma  Result Value Ref Range   Lactic Acid, Venous 1.7 0.5 -  1.9 mmol/L  Lactic acid, plasma  Result Value Ref Range   Lactic Acid, Venous 1.4 0.5 - 1.9 mmol/L  CBC with Differential  Result Value Ref Range   WBC 9.0 4.0 - 10.5 K/uL   RBC 4.83 3.87 - 5.11 MIL/uL   Hemoglobin 14.3 12.0 - 15.0 g/dL   HCT 43.4 36.0 - 46.0 %   MCV 89.9 80.0 - 100.0 fL   MCH 29.6 26.0 - 34.0 pg   MCHC 32.9 30.0 - 36.0 g/dL   RDW 13.7 11.5 - 15.5 %   Platelets 229 150 - 400 K/uL   nRBC 0.0 0.0 - 0.2 %   Neutrophils Relative % 81 %   Neutro Abs 7.2 1.7 - 7.7 K/uL   Lymphocytes Relative 13 %   Lymphs Abs 1.1 0.7 - 4.0 K/uL   Monocytes Relative 6 %   Monocytes Absolute 0.5 0.1 - 1.0 K/uL   Eosinophils Relative 0 %   Eosinophils Absolute 0.0 0.0 - 0.5 K/uL   Basophils Relative 0 %   Basophils Absolute 0.0 0.0 - 0.1 K/uL   Immature Granulocytes 0 %   Abs Immature Granulocytes 0.02 0.00 - 0.07 K/uL  Protime-INR  Result Value Ref Range   Prothrombin Time 12.3 11.4 - 15.2 seconds   INR 0.9 0.8 - 1.2      Assessment & Plan:   Problem List Items Addressed This Visit      Other   Cellulitis of right leg    Acute with slight improvement, but not 100%.  Initial treatment with Doxycycline to cover tick bite and cellulitis, at this time will switch to Keflex, script sent.  Continue Prednisone.  Recommend to continue to monitor temps daily and mark area of erythema.  Rest and elevation of extremity.  If worsening symptoms she is aware to immediately go to ER, may require IV abx if worsening.  Return in one week for f/u.         I discussed the assessment and treatment plan with the patient.  The patient was provided an opportunity to ask questions and all were answered. The patient agreed with the plan and demonstrated an understanding of the instructions.   The patient was advised to call back or seek an in-person evaluation if the symptoms worsen or if the condition fails to improve as anticipated.   I provided 15 minutes of time during this encounter.  Follow up plan: Return in about 1 week (around 06/09/2019) for Cellulitis follow-up.

## 2019-06-02 NOTE — Patient Instructions (Signed)

## 2019-06-02 NOTE — Assessment & Plan Note (Signed)
Acute with slight improvement, but not 100%.  Initial treatment with Doxycycline to cover tick bite and cellulitis, at this time will switch to Keflex, script sent.  Continue Prednisone.  Recommend to continue to monitor temps daily and mark area of erythema.  Rest and elevation of extremity.  If worsening symptoms she is aware to immediately go to ER, may require IV abx if worsening.  Return in one week for f/u.

## 2019-06-06 ENCOUNTER — Telehealth (INDEPENDENT_AMBULATORY_CARE_PROVIDER_SITE_OTHER): Payer: Self-pay | Admitting: Vascular Surgery

## 2019-06-07 ENCOUNTER — Other Ambulatory Visit: Payer: Self-pay | Admitting: Nurse Practitioner

## 2019-06-07 MED ORDER — BUDESONIDE-FORMOTEROL FUMARATE 160-4.5 MCG/ACT IN AERO: INHALATION_SPRAY | RESPIRATORY_TRACT | 3 refills | Status: DC

## 2019-06-07 NOTE — Telephone Encounter (Signed)
We can bring her in to see me or [facility]

## 2019-06-08 ENCOUNTER — Ambulatory Visit (INDEPENDENT_AMBULATORY_CARE_PROVIDER_SITE_OTHER): Payer: Medicare Other | Admitting: Nurse Practitioner

## 2019-06-08 ENCOUNTER — Other Ambulatory Visit: Payer: Self-pay | Admitting: Nurse Practitioner

## 2019-06-08 ENCOUNTER — Telehealth: Payer: Self-pay | Admitting: Nurse Practitioner

## 2019-06-08 ENCOUNTER — Encounter (INDEPENDENT_AMBULATORY_CARE_PROVIDER_SITE_OTHER): Payer: Self-pay | Admitting: Nurse Practitioner

## 2019-06-08 ENCOUNTER — Telehealth: Payer: Self-pay

## 2019-06-08 ENCOUNTER — Other Ambulatory Visit: Payer: Self-pay

## 2019-06-08 ENCOUNTER — Ambulatory Visit (INDEPENDENT_AMBULATORY_CARE_PROVIDER_SITE_OTHER): Payer: Medicare Other | Admitting: Pharmacist

## 2019-06-08 VITALS — BP 111/67 | HR 102 | Resp 12 | Ht 69.0 in | Wt 224.0 lb

## 2019-06-08 DIAGNOSIS — I890 Lymphedema, not elsewhere classified: Secondary | ICD-10-CM | POA: Diagnosis not present

## 2019-06-08 DIAGNOSIS — L03115 Cellulitis of right lower limb: Secondary | ICD-10-CM

## 2019-06-08 DIAGNOSIS — J449 Chronic obstructive pulmonary disease, unspecified: Secondary | ICD-10-CM

## 2019-06-08 DIAGNOSIS — K219 Gastro-esophageal reflux disease without esophagitis: Secondary | ICD-10-CM

## 2019-06-08 MED ORDER — CEPHALEXIN 500 MG PO CAPS: 500.0000 mg | ORAL_CAPSULE | Freq: Four times a day (QID) | ORAL | 0 refills | Status: DC

## 2019-06-08 MED ORDER — PREDNISONE 10 MG (21) PO TBPK: ORAL_TABLET | ORAL | 0 refills | Status: DC

## 2019-06-08 NOTE — Progress Notes (Signed)
Will extend keflex to 10 days - call with any concerns.

## 2019-06-08 NOTE — Chronic Care Management (AMB) (Signed)
  Chronic Care Management   Note  06/08/2019 Name: Sharon [name] MRN: [id number] DOB: 1946/09/23  Sharon [name] is a 73 y.o. year old female who is a primary care patient of [name], [name], NP. The CCM team was consulted for assistance with chronic disease management and care coordination needs.    Contacted patient for medication management support. Left HIPAA compliant message for patient to return my call at her convenience.   Follow up plan: - If I do not hear back from patient, will outreach in the next 2-4 weeks  Catie Darnelle Maffucci, PharmD Clinical Pharmacist [place] [phone]

## 2019-06-08 NOTE — Chronic Care Management (AMB) (Signed)
Chronic Care Management   Follow Up Note   06/08/2019 Name: Sharon [name] MRN: [id number] DOB: 16-Nov-1945  Referred by: [name], NP Reason for referral : Chronic Care Management (Medication Management)   Sharon [name] is a 73 y.o. year old female who is a primary care patient of [name], [name], NP. The CCM team was consulted for assistance with chronic disease management and care coordination needs.    Received return call from patient today.   Review of patient status, including review of consultants reports, relevant laboratory and other test results, and collaboration with appropriate care team members and the patient's provider was performed as part of comprehensive patient evaluation and provision of chronic care management services.    SDOH (Social Determinants of Health) screening performed today: None. See Care Plan for related entries.   Outpatient Encounter Medications as of 06/08/2019  Medication Sig Note  . albuterol (PROAIR HFA) 108 (90 Base) MCG/ACT inhaler Inhale 2 puffs into the lungs every 6 (six) hours as needed for wheezing or shortness of breath. 03/16/2019: QID during this acute illness, but generally 1-2x/day  . aspirin 81 MG chewable tablet Chew by mouth daily.   Marland Kitchen atorvastatin (LIPITOR) 40 MG tablet TAKE 1 TABLET BY MOUTH ONCE A DAY AT 6 PM   . budesonide-formoterol (SYMBICORT) 160-4.5 MCG/ACT inhaler USE 2 PUFFS BY MOUTH TWICE DAILY.   . citalopram (CELEXA) 20 MG tablet Take 1 tablet (20 mg total) by mouth daily. 05/27/2019: As needed  . clopidogrel (PLAVIX) 75 MG tablet TAKE 1 TABLET(75 MG) BY MOUTH DAILY   . Cyanocobalamin 1000 MCG/ML KIT Inject 1,000 mcg as directed every 30 (thirty) days.   . Emollient (EUCERIN) lotion Apply topically as needed for dry skin.   Marland Kitchen estradiol (VIVELLE-DOT) 0.1 MG/24HR patch APPLY 1 PATCH ONTO THE SKIN 2 TIMES A WEEK   . guaiFENesin (MUCINEX) 600 MG 12 hr tablet Take 1 tablet (600 mg total) by mouth 2 (two) times daily.  (Patient not taking: Reported on 05/10/2019)   . Loratadine (CLARITIN PO) Take by mouth daily.   . meclizine (ANTIVERT) 12.5 MG tablet Take 1 tablet (12.5 mg total) by mouth 3 (three) times daily as needed for dizziness (only if needed).   . montelukast (SINGULAIR) 10 MG tablet Take 1 tablet (10 mg total) by mouth at bedtime.   . Multiple Vitamin (MULTIVITAMIN) tablet Take 2 tablets by mouth daily.    . pantoprazole (PROTONIX) 40 MG tablet Take 1 tablet (40 mg total) by mouth daily.   . predniSONE (STERAPRED UNI-PAK 21 TAB) 10 MG (21) TBPK tablet Per packaging directions   . triamcinolone cream (KENALOG) 0.1 % Apply 1 application topically 2 (two) times daily.    No facility-administered encounter medications on file as of 06/08/2019.      Goals Addressed            This Visit's Progress     Patient Stated   . "My breathing is bad" (pt-stated)       Current Barriers:  . Former smoker, with current COPD; 1 outpatient exacerbation in the past 6 months, high symptom burden, likely COPD B;  o Would like her Symbicort prescription filled for a 3 month supply . No hx PFT on file, or that the patient is aware of . Reports currently needing albuterol up to QID, though at baseline using BID  Pharmacist Clinical Goal(s):  Marland Kitchen Over the next 90 days, patient will work with CCM team and primary care  provider to address needs related to optimized management of chronic lung disease  Interventions: . [place] to determine the problem with filling symbicort for a 90 day supply. The prescription must either read 30.3 g (3 inhalers) or just say 3 devices with the appropriate refills.   Patient Self Care Activities:  . Self administers medications as prescribed . Calls pharmacy for medication refills  Please see past updates related to this goal by clicking on the "Past Updates" button in the selected goal      . "My leg is still not healed" (pt-stated)       Current Barriers:   . Polypharmacy; complex patient with multiple comorbidities including chronic venous insufficiency, COPD, CAD, hx CVA, with recent cellulitis requiring multiple rounds of antibiotics. Currently completing a 5 day course of cephalexin 500 QID . Notes she saw [facility] today, and was instructed to contact her primary care provider to relay the following: o Leg is now wrapped w/ calamine lotion; leg is still red (though improved from initially), and recommended an extended course of cephalexin and prednisone o Patient notes that she attempted to call clinic to relay this information, but was on hold for 21 minutes before speaking to someone. Contacted me to ensure her message was relayed appropriately.   Pharmacist Clinical Goal(s):  Marland Kitchen Over the next 30 days, patient will work with PharmD and provider towards optimized medication management  Interventions: . As [name] is off this afternoon, will route this message to covering provider Dr. Wynetta Emery as well, to determine if prescriptions should be sent in today or can wait until Jolene is back tomorrow morning. Patient notes she has cephalexin remaining for today, but completed her previous prednisone taper.  Patient Self Care Activities:  . Patient will take medications as prescribed  Initial goal documentation         Plan: - Will collaborate with clinic providers on the above.   Catie Darnelle Maffucci, PharmD Clinical Pharmacist [place] [phone]

## 2019-06-08 NOTE — Addendum Note (Signed)
Addended by: Valerie Roys on: 06/08/2019 04:06 PM   Modules accepted: Orders

## 2019-06-08 NOTE — Progress Notes (Signed)
Reviewed Sharon [name]'s PharmD with CCM team note.  Patient seen face to face by vascular today, recommended extending Keflex and Prednisone.  Refills sent and spoke to patient.

## 2019-06-08 NOTE — Telephone Encounter (Signed)
Copied from [place] [phone]. Topic: General - Other >> Jun 08, 2019  2:53 PM Pauline Good wrote: Reason for CRM: pt went to [facility] today and was told by Dr Owens Shark that Henrine Screws need to prescribe more antibiotic and steroids for her. Please call pt if any questions.

## 2019-06-08 NOTE — Telephone Encounter (Signed)
Spoke to patient via telephone, review separate telephone note documented.

## 2019-06-08 NOTE — Patient Instructions (Signed)
Visit Information  Goals Addressed            This Visit's Progress     Patient Stated   . "My breathing is bad" (pt-stated)       Current Barriers:  . Former smoker, with current COPD; 1 outpatient exacerbation in the past 6 months, high symptom burden, likely COPD B;  o Would like her Symbicort prescription filled for a 3 month supply . No hx PFT on file, or that the patient is aware of . Reports currently needing albuterol up to QID, though at baseline using BID  Pharmacist Clinical Goal(s):  Marland Kitchen Over the next 90 days, patient will work with CCM team and primary care provider to address needs related to optimized management of chronic lung disease  Interventions: . [employer] to determine the problem with filling symbicort for a 90 day supply. The prescription must either read 30.3 g (3 inhalers) or just say 3 devices with the appropriate refills.   Patient Self Care Activities:  . Self administers medications as prescribed . Calls pharmacy for medication refills  Please see past updates related to this goal by clicking on the "Past Updates" button in the selected goal      . "My leg is still not healed" (pt-stated)       Current Barriers:  . Polypharmacy; complex patient with multiple comorbidities including chronic venous insufficiency, COPD, CAD, hx CVA, with recent cellulitis requiring multiple rounds of antibiotics. Currently completing a 5 day course of cephalexin 500 QID . Notes she saw [facility] today, and was instructed to contact her primary care provider to relay the following: o Leg is now wrapped w/ calamine lotion; leg is still red (though improved from initially), and recommended an extended course of cephalexin and prednisone o Patient notes that she attempted to call clinic to relay this information, but was on hold for 21 minutes before speaking to someone. Contacted me to ensure her message was relayed appropriately.   Pharmacist Clinical  Goal(s):  Marland Kitchen Over the next 30 days, patient will work with PharmD and provider towards optimized medication management  Interventions: . As [name] is off this afternoon, will route this message to covering provider Dr. Wynetta Emery as well, to determine if prescriptions should be sent in today or can wait until Jolene is back tomorrow morning. Patient notes she has cephalexin remaining for today, but completed her previous prednisone taper.  Patient Self Care Activities:  . Patient will take medications as prescribed  Initial goal documentation        The patient verbalized understanding of instructions provided today and declined a print copy of patient instruction materials.   Plan: - Will collaborate with clinic providers on the above.   Catie Darnelle Maffucci, PharmD Clinical Pharmacist [place] [phone]

## 2019-06-09 ENCOUNTER — Encounter: Payer: Medicare Other | Admitting: Nurse Practitioner

## 2019-06-10 ENCOUNTER — Encounter (INDEPENDENT_AMBULATORY_CARE_PROVIDER_SITE_OTHER): Payer: Self-pay | Admitting: Nurse Practitioner

## 2019-06-10 ENCOUNTER — Ambulatory Visit (INDEPENDENT_AMBULATORY_CARE_PROVIDER_SITE_OTHER): Payer: Medicare Other | Admitting: Nurse Practitioner

## 2019-06-10 ENCOUNTER — Other Ambulatory Visit: Payer: Self-pay

## 2019-06-10 ENCOUNTER — Ambulatory Visit (INDEPENDENT_AMBULATORY_CARE_PROVIDER_SITE_OTHER): Admitting: Nurse Practitioner

## 2019-06-10 ENCOUNTER — Encounter: Payer: Self-pay | Admitting: Nurse Practitioner

## 2019-06-10 VITALS — BP 131/79 | HR 102 | Resp 16

## 2019-06-10 DIAGNOSIS — J441 Chronic obstructive pulmonary disease with (acute) exacerbation: Secondary | ICD-10-CM

## 2019-06-10 DIAGNOSIS — L03115 Cellulitis of right lower limb: Secondary | ICD-10-CM

## 2019-06-10 DIAGNOSIS — I890 Lymphedema, not elsewhere classified: Secondary | ICD-10-CM

## 2019-06-10 NOTE — Progress Notes (Signed)
LMP  (LMP Unknown)    Subjective:    Patient ID: Sharon [name], female    DOB: 05-20-1946, 73 y.o.   MRN: [id number]  HPI: Sharon [name] is a 73 y.o. female  Chief Complaint  Patient presents with  . Cellulitis    leg cellulitis follow up     . This visit was completed via Doximity due to the restrictions of the COVID-19 pandemic. All issues as above were discussed and addressed. Physical exam was done as above through visual confirmation on Doximity. If it was felt that the patient should be evaluated in the office, they were directed there. The patient verbally consented to this visit. . Location of the patient: home . Location of the provider: home . Those involved with this call:  . Provider: [name], DNP . CMA: Yvonna Alanis, CMA . Front Desk/Registration: Jill Side  . Time spent on call: 15 minutes with patient face to face via video conference. More than 50% of this time was spent in counseling and coordination of care. 10 minutes total spent in review of patient's record and preparation of their chart.  . I verified patient identity using two factors (patient name and date of birth). Patient consents verbally to being seen via telemedicine visit today.    CELLULITIS RIGHT LEG: Has underlying lymphedema bilaterally L>R. Has been unable to go for massage therapy consistently due to Covid.  Redness and edema to right leg started August 12th. Started on Doxycyline on 05/27/19 for cellulitis, this was switched to Keflex 06/02/2019.    ER visit on 05/28/19 due to increased erythema and edema, with imaging noting no DVT and was told to continue current regimen + they added some Prednisone.  Labs in hospital setting WNL and was given a dose of Vancomycin.  Seen by vascular 06/08/2019 who has wrapped area now and recommended extension of Keflex and Prednisone, these scripts were sent by PCP.  Currently area is wrapped, so she reports unable to see if redness has improved  but does have vascular visit today at 1:45 during which she reports they are going to unwrap area and reassess in the office.  States no pain when walking on it, but states occasional "soreness".  We have discussed possibility of need for IV abx and admission if poor improvement, which she would prefer not to do because she has no one to look in on her husband.   Duration:days Pain:occasional soreness and none while walking on area. Bilateral:right lower leg Onset:gradual Sudden unintentional leg jerking:no Paresthesias:no Decreased sensation:no Weakness:no Insomnia:no Fatigue:no Status:stable  Relevant past medical, surgical, family and social history reviewed and updated as indicated. Interim medical history since our last visit reviewed. Allergies and medications reviewed and updated.  Review of Systems  Constitutional: Negative for activity change, appetite change, diaphoresis, fatigue and fever.  Respiratory: Negative for cough, chest tightness, shortness of breath and wheezing.   Cardiovascular: Positive for leg swelling. Negative for chest pain and palpitations.  Gastrointestinal: Negative for abdominal distention, abdominal pain, constipation, diarrhea, nausea and vomiting.  Skin: Negative for rash.  Neurological: Negative for dizziness, syncope, weakness, light-headedness, numbness and headaches.  Psychiatric/Behavioral: Negative.     Per HPI unless specifically indicated above     Objective:    LMP  (LMP Unknown)   Wt Readings from Last 3 Encounters:  06/08/19 224 lb (101.6 kg)  05/28/19 220 lb (99.8 kg)  05/28/19 220 lb (99.8 kg)    Physical Exam Vitals signs and  nursing note reviewed.  Constitutional:      General: She is awake. She is not in acute distress.    Appearance: She is well-developed. She is not ill-appearing.  HENT:     Head: Normocephalic.     Right Ear: Hearing normal.     Left Ear: Hearing normal.  Eyes:     General:  Lids are normal.        Right eye: No discharge.        Left eye: No discharge.     Conjunctiva/sclera: Conjunctivae normal.  Neck:     Musculoskeletal: Normal range of motion.  Pulmonary:     Effort: Pulmonary effort is normal. No accessory muscle usage or respiratory distress.  Skin:    Comments: Right leg currently wrapped, unable to visualize area.    Neurological:     Mental Status: She is alert and oriented to person, place, and time.  Psychiatric:        Attention and Perception: Attention normal.        Mood and Affect: Mood normal.        Behavior: Behavior normal. Behavior is cooperative.        Thought Content: Thought content normal.        Judgment: Judgment normal.     Results for orders placed or performed during the hospital encounter of 05/28/19  Culture, blood (Routine x 2)   Specimen: BLOOD  Result Value Ref Range   Specimen Description BLOOD BLOOD RIGHT HAND    Special Requests      BOTTLES DRAWN AEROBIC AND ANAEROBIC Blood Culture adequate volume   Culture      NO GROWTH 5 DAYS Performed at [facility], [address] [place], [place] [address]    Report Status 06/02/2019 FINAL   Culture, blood (Routine x 2)   Specimen: BLOOD  Result Value Ref Range   Specimen Description BLOOD LAC    Special Requests      BOTTLES DRAWN AEROBIC AND ANAEROBIC Blood Culture adequate volume   Culture      NO GROWTH 5 DAYS Performed at [facility], [address] [place],  [address]    Report Status 06/02/2019 FINAL   Comprehensive metabolic panel  Result Value Ref Range   Sodium 140 135 - 145 mmol/L   Potassium 3.7 3.5 - 5.1 mmol/L   Chloride 108 98 - 111 mmol/L   CO2 22 22 - 32 mmol/L   Glucose, Bld 166 (H) 70 - 99 mg/dL   BUN 13 8 - 23 mg/dL   Creatinine, Ser 0.92 0.44 - 1.00 mg/dL   Calcium 8.8 (L) 8.9 - 10.3 mg/dL   Total Protein 7.1 6.5 - 8.1 g/dL   Albumin 4.1 3.5 - 5.0 g/dL   AST 33 15 - 41 U/L   ALT 30 0 - 44 U/L    Alkaline Phosphatase 63 38 - 126 U/L   Total Bilirubin 0.9 0.3 - 1.2 mg/dL   GFR calc non Af Amer >60 >60 mL/min   GFR calc Af Amer >60 >60 mL/min   Anion gap 10 5 - 15  Lactic acid, plasma  Result Value Ref Range   Lactic Acid, Venous 1.7 0.5 - 1.9 mmol/L  Lactic acid, plasma  Result Value Ref Range   Lactic Acid, Venous 1.4 0.5 - 1.9 mmol/L  CBC with Differential  Result Value Ref Range   WBC 9.0 4.0 - 10.5 K/uL   RBC 4.83 3.87 - 5.11 MIL/uL  Hemoglobin 14.3 12.0 - 15.0 g/dL   HCT 43.4 36.0 - 46.0 %   MCV 89.9 80.0 - 100.0 fL   MCH 29.6 26.0 - 34.0 pg   MCHC 32.9 30.0 - 36.0 g/dL   RDW 13.7 11.5 - 15.5 %   Platelets 229 150 - 400 K/uL   nRBC 0.0 0.0 - 0.2 %   Neutrophils Relative % 81 %   Neutro Abs 7.2 1.7 - 7.7 K/uL   Lymphocytes Relative 13 %   Lymphs Abs 1.1 0.7 - 4.0 K/uL   Monocytes Relative 6 %   Monocytes Absolute 0.5 0.1 - 1.0 K/uL   Eosinophils Relative 0 %   Eosinophils Absolute 0.0 0.0 - 0.5 K/uL   Basophils Relative 0 %   Basophils Absolute 0.0 0.0 - 0.1 K/uL   Immature Granulocytes 0 %   Abs Immature Granulocytes 0.02 0.00 - 0.07 K/uL  Protime-INR  Result Value Ref Range   Prothrombin Time 12.3 11.4 - 15.2 seconds   INR 0.9 0.8 - 1.2      Assessment & Plan:   Problem List Items Addressed This Visit      Other   Cellulitis of right leg    Will continue Keflex and Prednisone for extended dosing.  Sees vascular in their office today to unwrap area, will await their recommendations and determine next steps for plan of care.  If need for IV abx will work with CCM team on possibly getting this in home, so patient does not need to leave husband.  Return in one week or sooner if worsening. She is aware if worsening symptoms to immediately go to ER.          Follow up plan: Return in about 1 week (around 06/17/2019) for Cellulitis.

## 2019-06-10 NOTE — Assessment & Plan Note (Signed)
Will continue Keflex and Prednisone for extended dosing.  Sees vascular in their office today to unwrap area, will await their recommendations and determine next steps for plan of care.  If need for IV abx will work with CCM team on possibly getting this in home, so patient does not need to leave husband.  Return in one week or sooner if worsening. She is aware if worsening symptoms to immediately go to ER.

## 2019-06-10 NOTE — Patient Instructions (Signed)

## 2019-06-12 NOTE — Progress Notes (Signed)
SUBJECTIVE:  Patient ID: Sharon [name], female    DOB: 20-Dec-1945, 73 y.o.   MRN: [id number] Chief Complaint  Patient presents with  . Follow-up    HPI  Sharon [name] is a 73 y.o. female presents today for evaluation of her right lower extremity following wrap therapy.  The patient had extensive cellulitis of her right lower extremity and recently was finishing antibiotics as well as steroids at her previous visit.  Upon this office's recommendation the PCP gave her another dose of antibiotics as well as steroids and today the swelling is much improved and so is the redness.  There was concern previously that the patient may need to be hospitalized for IV antibiotics however at this time the swelling and erythema have greatly decreased.  Patient denies any systemic effect such as fever, chills, nausea, vomiting or diarrhea.  She denies any chest pain or shortness of breath.  Past Medical History:  Diagnosis Date  . Anxiety   . Arthritis    hands, upper back  . Asthma   . COPD (chronic obstructive pulmonary disease) ([place])   . History of cervical cancer   . Menopausal disorder   . Osteoporosis   . Pneumonia 1960  . Spasm of abdominal muscles of right side    intermittent  . TMJ (dislocation of temporomandibular joint)   . Wears dentures    partial lower    Past Surgical History:  Procedure Laterality Date  . ABDOMINAL HYSTERECTOMY  1970's  . bladder botox  2005  . BLADDER SUSPENSION  2004  . CATARACT EXTRACTION W/PHACO Right 03/09/2018   Procedure: CATARACT EXTRACTION PHACO AND INTRAOCULAR LENS PLACEMENT ([place]) right;  Surgeon: Eulogio Bear, MD;  Location: [facility];  Service: Ophthalmology;  Laterality: Right;  CALL CELL 1ST  . CATARACT EXTRACTION W/PHACO Left 04/19/2018   Procedure: CATARACT EXTRACTION PHACO AND INTRAOCULAR LENS PLACEMENT ([facility])  LEFT;  Surgeon: Eulogio Bear, MD;  Location: [facility];  Service: Ophthalmology;  Laterality: Left;  .  COLONOSCOPY WITH PROPOFOL N/A 12/13/2015   Procedure: COLONOSCOPY WITH PROPOFOL;  Surgeon: Lucilla Lame, MD;  Location: [facility];  Service: Endoscopy;  Laterality: N/A;  . LOOP RECORDER INSERTION N/A 01/07/2018   Procedure: LOOP RECORDER INSERTION;  Surgeon: Deboraha Sprang, MD;  Location: [facility];  Service: Cardiovascular;  Laterality: N/A;  . POLYPECTOMY N/A 12/13/2015   Procedure: POLYPECTOMY;  Surgeon: Lucilla Lame, MD;  Location: [facility];  Service: Endoscopy;  Laterality: N/A;  SIGMOID COLON POLYPS X  5  . SHOULDER ARTHROSCOPY W/ ROTATOR CUFF REPAIR Right 1998  . TEE WITHOUT CARDIOVERSION N/A 01/06/2018   Procedure: TRANSESOPHAGEAL ECHOCARDIOGRAM (TEE);  Surgeon: Minna Merritts, MD;  Location: [facility];  Service: Cardiovascular;  Laterality: N/A;  . TONSILLECTOMY AND ADENOIDECTOMY      Social History   Socioeconomic History  . Marital status: Married    Spouse name: Liyat Faulkenberry  . Number of children: 1  . Years of education: Not on file  . Highest education level: Some college, no degree  Occupational History  . Occupation: retired  Scientific laboratory technician  . Financial resource strain: Not hard at all  . Food insecurity    Worry: Never true    Inability: Never true  . Transportation needs    Medical: No    Non-medical: No  Tobacco Use  . Smoking status: Former Smoker    Packs/day: 0.60    Years: 56.00  Pack years: 33.60    Types: Cigarettes    Quit date: 07/14/2016    Years since quitting: 2.9  . Smokeless tobacco: Never Used  . Tobacco comment: has smoked off and on  Substance and Sexual Activity  . Alcohol use: Yes    Alcohol/week: 0.0 standard drinks    Comment: occasionally - special occasions  . Drug use: No  . Sexual activity: Not Currently    Birth control/protection: Post-menopausal  Lifestyle  . Physical activity    Days per week: 7 days    Minutes per session: 10 min  . Stress: Very much  Relationships  . Social connections     Talks on phone: More than three times a week    Gets together: Once a week    Attends religious service: 1 to 4 times per year    Active member of club or organization: No    Attends meetings of clubs or organizations: Never    Relationship status: Married  . Intimate partner violence    Fear of current or ex partner: No    Emotionally abused: No    Physically abused: No    Forced sexual activity: No  Other Topics Concern  . 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 Diabetes Mother   . Heart disease Mother   . Stroke Mother   . Stroke Maternal Grandmother     Allergies  Allergen Reactions  . Baclofen Swelling  . Librium [Chlordiazepoxide] Itching    Dizziness   . Bactrim [Sulfamethoxazole-Trimethoprim] Nausea And Vomiting  . Ibuprofen Rash    Mouth swelling  . Latex Rash    Some bandaids, some gloves; BLOOD TEST NEGATIVE  . Naprosyn [Naproxen] Rash    Mouth swelling  . Other Rash    [place] nuts - mouth swelling     Review of Systems   Review of Systems: Negative Unless Checked Constitutional: []Weight loss  []Fever  []Chills Cardiac: []Chest pain   [] Atrial Fibrillation  []Palpitations   []Shortness of breath when laying flat   []Shortness of breath with exertion. []Shortness of breath at rest Vascular:  []Pain in legs with walking   []Pain in legs with standing []Pain in legs when laying flat   []Claudication    []Pain in feet when laying flat    []History of DVT   []Phlebitis   [x]Swelling in legs   [x]Varicose veins   []Non-healing ulcers Pulmonary:   []Uses home oxygen   []Productive cough   []Hemoptysis   []Wheeze  []COPD   []Asthma Neurologic:  []Dizziness   []Seizures  []Blackouts []History of stroke   []History of TIA  []Aphasia   []Temporary Blindness   []Weakness or numbness in arm   []Weakness or numbness in leg Musculoskeletal:   []Joint swelling   []Joint pain   []Low  back pain  [] History of Knee Replacement [x]Arthritis []back Surgeries  [] Spinal Stenosis    Hematologic:  []Easy bruising  []Easy bleeding   []Hypercoagulable state   []Anemic Gastrointestinal:  []Diarrhea   []Vomiting  []Gastroesophageal reflux/heartburn   []Difficulty swallowing. []Abdominal pain Genitourinary:  [x]Chronic kidney disease   []Difficult urination  []Anuric   []Blood in urine []Frequent urination  []Burning with urination   []Hematuria Skin:  []Rashes   []Ulcers []Wounds Psychological:  []History of anxiety   [] History of major depression  [] Memory  Difficulties      OBJECTIVE:   Physical Exam  BP 111/67 (BP Location: Left Arm, Patient Position: Sitting, Cuff Size: Normal)   Pulse (!) 102   Resp 12   Ht 5' 9" (1.753 m)   Wt 224 lb (101.6 kg)   LMP  (LMP Unknown)   BMI 33.08 kg/m   Gen: WD/WN, NAD Head: [place]/AT, No temporalis wasting.  Ear/Nose/Throat: Hearing grossly intact, nares w/o erythema or drainage Eyes: PER, EOMI, sclera nonicteric.  Neck: Supple, no masses.  No JVD.  Pulmonary:  Good air movement, no use of accessory muscles.  Cardiac: RRR Vascular:  2+ nonpitting edema with some erythema Vessel Right Left  Radial Palpable Palpable  Dorsalis Pedis Palpable Palpable  Posterior Tibial Palpable Palpable   Gastrointestinal: soft, non-distended. No guarding/no peritoneal signs.  Musculoskeletal: M/S 5/5 throughout.  No deformity or atrophy.  Neurologic: Pain and light touch intact in extremities.  Symmetrical.  Speech is fluent. Motor exam as listed above. Psychiatric: Judgment intact, Mood & affect appropriate for pt's clinical situation. Dermatologic: No Venous rashes. No Ulcers Noted.    Improving right lower extremity cellulitis Lymph : No Cervical lymphadenopathy, no lichenification or skin changes of chronic lymphedema.       ASSESSMENT AND PLAN:  1. Cellulitis of right leg Greatly improved from two days ago.  There is still some that is crept  above the knee but again it is much better. Controlling the swelling will be important to stopping the progression.  The patient will have unna wraps placed and we will continue to evaluate progress   2. Gastroesophageal reflux disease without esophagitis Continue PPI as already ordered, this medication has been reviewed and there are no changes at this time.  Avoidence of caffeine and alcohol  Moderate elevation of the head of the bed   3. Lymphedema No surgery or intervention at this point in time.  I have reviewed my discussion with the patient regarding venous insufficiency and why it causes symptoms. I have discussed with the patient the chronic skin changes that accompany venous insufficiency and the long term sequela such as ulceration. Patient will contnue wearing graduated compression stockings on a daily basis, as this has provided excellent control of his edema. The patient will put the stockings on first thing in the morning and removing them in the evening. The patient is reminded not to sleep in the stockings.  In addition, behavioral modification including elevation during the day will be initiated. Exercise is strongly encouraged.     Current Outpatient Medications on File Prior to Visit  Medication Sig Dispense Refill  . albuterol (PROAIR HFA) 108 (90 Base) MCG/ACT inhaler Inhale 2 puffs into the lungs every 6 (six) hours as needed for wheezing or shortness of breath. 8.5 Inhaler 12  . aspirin 81 MG chewable tablet Chew by mouth daily.    Marland Kitchen atorvastatin (LIPITOR) 40 MG tablet TAKE 1 TABLET BY MOUTH ONCE A DAY AT 6 PM 90 tablet 3  . budesonide-formoterol (SYMBICORT) 160-4.5 MCG/ACT inhaler USE 2 PUFFS BY MOUTH TWICE DAILY. 10.2 g 3  . citalopram (CELEXA) 20 MG tablet Take 1 tablet (20 mg total) by mouth daily. 90 tablet 3  . clopidogrel (PLAVIX) 75 MG tablet TAKE 1 TABLET(75 MG) BY MOUTH DAILY 90 tablet 2  . Cyanocobalamin 1000 MCG/ML KIT Inject 1,000 mcg as directed every  30 (thirty) days.    Marland Kitchen estradiol (VIVELLE-DOT) 0.1 MG/24HR patch APPLY 1 PATCH ONTO THE SKIN 2 TIMES A WEEK  8 patch 11  . Loratadine (CLARITIN PO) Take by mouth daily.    . meclizine (ANTIVERT) 12.5 MG tablet Take 1 tablet (12.5 mg total) by mouth 3 (three) times daily as needed for dizziness (only if needed). 30 tablet 0  . montelukast (SINGULAIR) 10 MG tablet Take 1 tablet (10 mg total) by mouth at bedtime. 30 tablet 3  . Multiple Vitamin (MULTIVITAMIN) tablet Take 2 tablets by mouth daily.     . pantoprazole (PROTONIX) 40 MG tablet Take 1 tablet (40 mg total) by mouth daily. 90 tablet 3  . triamcinolone cream (KENALOG) 0.1 % Apply 1 application topically 2 (two) times daily. 30 g 0   No current facility-administered medications on file prior to visit.     There are no Patient Instructions on file for this visit. No follow-ups on file.   Kris Hartmann, NP  This note was completed with Sales executive.  Any errors are purely unintentional.

## 2019-06-13 ENCOUNTER — Encounter (INDEPENDENT_AMBULATORY_CARE_PROVIDER_SITE_OTHER): Payer: Self-pay

## 2019-06-13 ENCOUNTER — Encounter (INDEPENDENT_AMBULATORY_CARE_PROVIDER_SITE_OTHER): Payer: Self-pay | Admitting: Nurse Practitioner

## 2019-06-13 NOTE — Progress Notes (Signed)
SUBJECTIVE:  Patient ID: Sharon [name], female    DOB: 1946/03/31, 73 y.o.   MRN: [id number] Chief Complaint  Patient presents with  . Follow-up    HPI  Sharon [name] is a 73 y.o. female  presents today for evaluation of her right lower extremity following extensive cellulitis of her right lower extremity and recently was finishing antibiotics as well as steroids at her previous visit.    There was concern previously that the patient may need to be hospitalized for IV antibiotics however at this time the swelling and erythema have greatly decreased from initial cellulitis diagnosis.  Patient denies any systemic effect such as fever, chills, nausea, vomiting or diarrhea.  She denies any chest pain or shortness of breath.  Past Medical History:  Diagnosis Date  . Anxiety   . Arthritis    hands, upper back  . Asthma   . COPD (chronic obstructive pulmonary disease) ([place])   . History of cervical cancer   . Menopausal disorder   . Osteoporosis   . Pneumonia 1960  . Spasm of abdominal muscles of right side    intermittent  . TMJ (dislocation of temporomandibular joint)   . Wears dentures    partial lower    Past Surgical History:  Procedure Laterality Date  . ABDOMINAL HYSTERECTOMY  1970's  . bladder botox  2005  . BLADDER SUSPENSION  2004  . CATARACT EXTRACTION W/PHACO Right 03/09/2018   Procedure: CATARACT EXTRACTION PHACO AND INTRAOCULAR LENS PLACEMENT ([place]) right;  Surgeon: Eulogio Bear, MD;  Location: [place];  Service: Ophthalmology;  Laterality: Right;  CALL CELL 1ST  . CATARACT EXTRACTION W/PHACO Left 04/19/2018   Procedure: CATARACT EXTRACTION PHACO AND INTRAOCULAR LENS PLACEMENT (I[facility])  LEFT;  Surgeon: Eulogio Bear, MD;  Location: [place];  Service: Ophthalmology;  Laterality: Left;  . COLONOSCOPY WITH PROPOFOL N/A 12/13/2015   Procedure: COLONOSCOPY WITH PROPOFOL;  Surgeon: Lucilla Lame, MD;  Location: [place];  Service:  Endoscopy;  Laterality: N/A;  . LOOP RECORDER INSERTION N/A 01/07/2018   Procedure: LOOP RECORDER INSERTION;  Surgeon: Deboraha Sprang, MD;  Location: [facility];  Service: Cardiovascular;  Laterality: N/A;  . POLYPECTOMY N/A 12/13/2015   Procedure: POLYPECTOMY;  Surgeon: Lucilla Lame, MD;  Location: [place];  Service: Endoscopy;  Laterality: N/A;  SIGMOID COLON POLYPS X  5  . SHOULDER ARTHROSCOPY W/ ROTATOR CUFF REPAIR Right 1998  . TEE WITHOUT CARDIOVERSION N/A 01/06/2018   Procedure: TRANSESOPHAGEAL ECHOCARDIOGRAM (TEE);  Surgeon: Minna Merritts, MD;  Location: [facility];  Service: Cardiovascular;  Laterality: N/A;  . TONSILLECTOMY AND ADENOIDECTOMY      Social History   Socioeconomic History  . Marital status: Married    Spouse name: Laconya Clere  . Number of children: 1  . Years of education: Not on file  . Highest education level: Some college, no degree  Occupational History  . Occupation: retired  Scientific laboratory technician  . Financial resource strain: Not hard at all  . Food insecurity    Worry: Never true    Inability: Never true  . Transportation needs    Medical: No    Non-medical: No  Tobacco Use  . Smoking status: Former Smoker    Packs/day: 0.60    Years: 56.00    Pack years: 33.60    Types: Cigarettes    Quit date: 07/14/2016    Years since quitting: 2.9  . Smokeless tobacco: Never Used  .  Tobacco comment: has smoked off and on  Substance and Sexual Activity  . Alcohol use: Yes    Alcohol/week: 0.0 standard drinks    Comment: occasionally - special occasions  . Drug use: No  . Sexual activity: Not Currently    Birth control/protection: Post-menopausal  Lifestyle  . Physical activity    Days per week: 7 days    Minutes per session: 10 min  . Stress: Very much  Relationships  . Social connections    Talks on phone: More than three times a week    Gets together: Once a week    Attends religious service: 1 to 4 times per year    Active member of  club or organization: No    Attends meetings of clubs or organizations: Never    Relationship status: Married  . Intimate partner violence    Fear of current or ex partner: No    Emotionally abused: No    Physically abused: No    Forced sexual activity: No  Other Topics Concern  . 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 Diabetes Mother   . Heart disease Mother   . Stroke Mother   . Stroke Maternal Grandmother     Allergies  Allergen Reactions  . Baclofen Swelling  . Librium [Chlordiazepoxide] Itching    Dizziness   . Bactrim [Sulfamethoxazole-Trimethoprim] Nausea And Vomiting  . Ibuprofen Rash    Mouth swelling  . Latex Rash    Some bandaids, some gloves; BLOOD TEST NEGATIVE  . Naprosyn [Naproxen] Rash    Mouth swelling  . Other Rash    [place] nuts - mouth swelling     Review of Systems   Review of Systems: Negative Unless Checked Constitutional: _0 Weight loss  _1 Fever  _2 Chills Cardiac: _3 Chest pain   _4  Atrial Fibrillation  _5 Palpitations   _6 Shortness of breath when laying flat   _7 Shortness of breath with exertion. _8 Shortness of breath at rest Vascular:  _9 Pain in legs with walking   _10 Pain in legs with standing _11 Pain in legs when laying flat   _12 Claudication    _13 Pain in feet when laying flat    _14 History of DVT   _15 Phlebitis   _16 Swelling in legs   _17 Varicose veins   _18 Non-healing ulcers Pulmonary:   _19 Uses home oxygen   _20 Productive cough   _21 Hemoptysis   _22 Wheeze  _23 COPD   _24 Asthma Neurologic:  _25 Dizziness   _26 Seizures  _27 Blackouts _28 History of stroke   _29 History of TIA  _30 Aphasia   _31 Temporary Blindness   _32 Weakness or numbness in arm   _33 Weakness or numbness in leg Musculoskeletal:   _34 Joint swelling   _35 Joint pain   _36 Low back pain  _37  History of Knee Replacement _38 Arthritis _39 back Surgeries  _40  Spinal Stenosis    Hematologic:  _41 Easy bruising  _42 Easy bleeding    _43 Hypercoagulable state   _44 Anemic Gastrointestinal:  _45 Diarrhea   _46 Vomiting  _47 Gastroesophageal reflux/heartburn   _48 Difficulty swallowing. _49 Abdominal pain Genitourinary:  _50 Chronic kidney disease   _51 Difficult urination  _52 Anuric   _53 Blood in urine _54 Frequent urination  _55 Burning with urination   _56 Hematuria Skin:  _57 Rashes   _58 Ulcers _59 Wounds Psychological:  _60 History of anxiety   _61  History of major depression  _62  Memory Difficulties      OBJECTIVE:   Physical Exam  BP 131/79 (BP Location: Right Arm)   Pulse (!) 102   Resp 16  LMP  (LMP Unknown)   Gen: WD/WN, NAD Head: [place]/AT, No temporalis wasting.  Ear/Nose/Throat: Hearing grossly intact, nares w/o erythema or drainage Eyes: PER, EOMI, sclera nonicteric.  Neck: Supple, no masses.  No JVD.  Pulmonary:  Good air movement, no use of accessory muscles.  Cardiac: RRR Vascular: 3+ pitting edema right lower extremity Vessel Right Left  Radial Palpable Palpable  Dorsalis Pedis Palpable Palpable  Posterior Tibial Palpable Palpable   Gastrointestinal: soft, non-distended. No guarding/no peritoneal signs.  Musculoskeletal: M/S 5/5 throughout.  No deformity or atrophy.  Neurologic: Pain and light touch intact in extremities.  Symmetrical.  Speech is fluent. Motor exam as listed above. Psychiatric: Judgment intact, Mood & affect appropriate for pt's clinical situation. Dermatologic: Cellulitis of right leg, creeping up right thigh Lymph : No Cervical lymphadenopathy, no lichenification or skin changes of chronic lymphedema.       ASSESSMENT AND PLAN:  1. Cellulitis of right leg Cellulitis is progressively better but the creeping nature up hte leg cause concern that IV antibiotics may be needed.  Spoke with patient's PCP and they will prescribe another dose of abx and steroids. We will place the patient in unna wraps but have her follow up in two days to ensure that cellulitis is not worsening.      2. Lymphedema See above   3. COPD exacerbation ([place]) Continue pulmonary medications and aerosols as already ordered, these medications have been reviewed and there are no changes at this time.     Current Outpatient Medications on File Prior to Visit  Medication Sig Dispense Refill  . albuterol (PROAIR HFA) 108 (90 Base) MCG/ACT inhaler Inhale 2 puffs into the lungs every 6 (six) hours as needed for wheezing or shortness of breath. 8.5 Inhaler 12  . aspirin 81 MG chewable tablet Chew by mouth daily.    Marland Kitchen atorvastatin (LIPITOR) 40 MG tablet TAKE 1 TABLET BY MOUTH ONCE A DAY AT 6 PM 90 tablet 3  . budesonide-formoterol (SYMBICORT) 160-4.5 MCG/ACT inhaler USE 2 PUFFS BY MOUTH TWICE DAILY. 10.2 g 3  . cephALEXin (KEFLEX) 500 MG capsule Take 1 capsule (500 mg total) by mouth 4 (four) times daily for 5 days. 20 capsule 0  . citalopram (CELEXA) 20 MG tablet Take 1 tablet (20 mg total) by mouth daily. 90 tablet 3  . clopidogrel (PLAVIX) 75 MG tablet TAKE 1 TABLET(75 MG) BY MOUTH DAILY 90 tablet 2  . Cyanocobalamin 1000 MCG/ML KIT Inject 1,000 mcg as directed every 30 (thirty) days.    Marland Kitchen estradiol (VIVELLE-DOT) 0.1 MG/24HR patch APPLY 1 PATCH ONTO THE SKIN 2 TIMES A WEEK 8 patch 11  . Loratadine (CLARITIN PO) Take by mouth daily.    . meclizine (ANTIVERT) 12.5 MG tablet Take 1 tablet (12.5 mg total) by mouth 3 (three) times daily as needed for dizziness (only if needed). 30 tablet 0  . montelukast (SINGULAIR) 10 MG tablet Take 1 tablet (10 mg total) by mouth at bedtime. 30 tablet 3  . Multiple Vitamin (MULTIVITAMIN) tablet Take 2 tablets by mouth daily.     . pantoprazole (PROTONIX) 40 MG tablet Take 1 tablet (40 mg total) by mouth daily. 90 tablet 3  . predniSONE (STERAPRED UNI-PAK 21 TAB) 10 MG (21) TBPK tablet Per packaging directions 21 tablet 0  . triamcinolone cream (KENALOG) 0.1 % Apply 1 application topically 2 (two) times daily. 30 g 0   No current facility-administered medications on file prior to visit.      There are  no Patient Instructions on file for this visit. No follow-ups on file.   Kris Hartmann, NP  This note was completed with Sales executive.  Any errors are purely unintentional.

## 2019-06-15 ENCOUNTER — Other Ambulatory Visit: Payer: Self-pay

## 2019-06-15 ENCOUNTER — Ambulatory Visit (INDEPENDENT_AMBULATORY_CARE_PROVIDER_SITE_OTHER): Admitting: Nurse Practitioner

## 2019-06-15 ENCOUNTER — Encounter (INDEPENDENT_AMBULATORY_CARE_PROVIDER_SITE_OTHER): Payer: Self-pay | Admitting: Nurse Practitioner

## 2019-06-15 VITALS — BP 124/67 | HR 102 | Resp 16 | Wt 226.0 lb

## 2019-06-15 DIAGNOSIS — I890 Lymphedema, not elsewhere classified: Secondary | ICD-10-CM

## 2019-06-15 NOTE — Progress Notes (Signed)
History of Present Illness  There is no documented history at this time  Assessments & Plan   There are no diagnoses linked to this encounter.    Additional instructions  Subjective:  Patient presents with venous ulcer of the Right lower extremity.    Procedure:  3 layer unna wrap was placed Right lower extremity.   Plan:   Follow up in one week.   

## 2019-06-16 ENCOUNTER — Ambulatory Visit (INDEPENDENT_AMBULATORY_CARE_PROVIDER_SITE_OTHER): Payer: Medicare Other | Admitting: *Deleted

## 2019-06-16 DIAGNOSIS — I6310 Cerebral infarction due to embolism of unspecified precerebral artery: Secondary | ICD-10-CM | POA: Diagnosis not present

## 2019-06-16 LAB — CUP PACEART REMOTE DEVICE CHECK
Date Time Interrogation Session: 20200903155933
Implantable Pulse Generator Implant Date: 20190328

## 2019-06-17 ENCOUNTER — Ambulatory Visit: Payer: Self-pay | Admitting: Pharmacist

## 2019-06-17 ENCOUNTER — Telehealth: Payer: Self-pay | Admitting: Internal Medicine

## 2019-06-17 ENCOUNTER — Telehealth: Payer: Self-pay

## 2019-06-17 NOTE — Chronic Care Management (AMB) (Signed)
  Chronic Care Management   Note  06/17/2019 Name: MEHA GOON MRN: [id number] DOB: May 30, 1946  [name] is a 73 y.o. year old female who is a primary care patient of [name], [name], NP. The CCM team was consulted for assistance with chronic disease management and care coordination needs.   Contacted patient to f/u on medication management needs. She has been communicating extensively this week with primary care provider and vascular provider regarding leg cellulitis. I left a voicemail noting that I was checking in; if she has any needs to give me a call back, otherwise, CCM team will outreach in the next 2-4 weeks.    Catie Darnelle Maffucci, PharmD Clinical Pharmacist [place] [phone]

## 2019-06-17 NOTE — Telephone Encounter (Signed)
Spoke w/ pt and attempted to help her trouble shoot her monitor. Error code [id number]. I informed pt to let the monitor charged and I will call her back around 3:50 PM. Pt verbalized understanding.

## 2019-06-17 NOTE — Telephone Encounter (Signed)
New Message:  Patient called and said that a light on her device went on about 4 am yesterday. And has not turned off since. She wanted to make sure nothing was wrong with her machine, and needed assistance turning the light off. She said the light in her room is so bright it keeps her from sleeping.

## 2019-06-17 NOTE — Telephone Encounter (Signed)
Spoke w/ pt and instructed her how to send a manual transmission. Transmission received. I informed her that if she continues to have issues w/ the light to call tech support for additional help w/ trouble shooting. Pt verbalized understanding.

## 2019-06-21 ENCOUNTER — Ambulatory Visit (INDEPENDENT_AMBULATORY_CARE_PROVIDER_SITE_OTHER): Payer: Medicare Other

## 2019-06-21 ENCOUNTER — Other Ambulatory Visit: Payer: Self-pay

## 2019-06-21 DIAGNOSIS — K140 Glossitis: Secondary | ICD-10-CM | POA: Diagnosis not present

## 2019-06-21 MED ORDER — CYANOCOBALAMIN 1000 MCG/ML IJ SOLN
1000.0000 ug | Freq: Once | INTRAMUSCULAR | Status: AC
  Administered 2019-06-21: 1000 ug via INTRAMUSCULAR

## 2019-06-22 ENCOUNTER — Ambulatory Visit (INDEPENDENT_AMBULATORY_CARE_PROVIDER_SITE_OTHER): Admitting: Nurse Practitioner

## 2019-06-22 ENCOUNTER — Encounter (INDEPENDENT_AMBULATORY_CARE_PROVIDER_SITE_OTHER): Payer: Self-pay

## 2019-06-22 VITALS — BP 118/72 | HR 98 | Resp 16 | Wt 223.4 lb

## 2019-06-22 DIAGNOSIS — I890 Lymphedema, not elsewhere classified: Secondary | ICD-10-CM

## 2019-06-22 NOTE — Progress Notes (Signed)
History of Present Illness  There is no documented history at this time  Assessments & Plan   There are no diagnoses linked to this encounter.    Additional instructions  Subjective:  Patient presents with venous ulcer of the Right lower extremity.    Procedure:  3 layer unna wrap was placed Right lower extremity.   Plan:   Follow up in one week.   

## 2019-06-24 ENCOUNTER — Telehealth: Payer: Self-pay

## 2019-06-28 ENCOUNTER — Other Ambulatory Visit: Payer: Self-pay

## 2019-06-28 ENCOUNTER — Ambulatory Visit (INDEPENDENT_AMBULATORY_CARE_PROVIDER_SITE_OTHER): Payer: Medicare Other | Admitting: Nurse Practitioner

## 2019-06-28 ENCOUNTER — Encounter: Payer: Self-pay | Admitting: Nurse Practitioner

## 2019-06-28 VITALS — BP 108/71 | HR 78 | Temp 98.5°F | Ht 68.0 in | Wt 226.0 lb

## 2019-06-28 DIAGNOSIS — Z23 Encounter for immunization: Secondary | ICD-10-CM

## 2019-06-28 DIAGNOSIS — R7303 Prediabetes: Secondary | ICD-10-CM

## 2019-06-28 DIAGNOSIS — J449 Chronic obstructive pulmonary disease, unspecified: Secondary | ICD-10-CM

## 2019-06-28 DIAGNOSIS — D692 Other nonthrombocytopenic purpura: Secondary | ICD-10-CM

## 2019-06-28 DIAGNOSIS — I890 Lymphedema, not elsewhere classified: Secondary | ICD-10-CM | POA: Diagnosis not present

## 2019-06-28 DIAGNOSIS — Z780 Asymptomatic menopausal state: Secondary | ICD-10-CM

## 2019-06-28 DIAGNOSIS — E538 Deficiency of other specified B group vitamins: Secondary | ICD-10-CM | POA: Diagnosis not present

## 2019-06-28 DIAGNOSIS — E559 Vitamin D deficiency, unspecified: Secondary | ICD-10-CM

## 2019-06-28 DIAGNOSIS — Z0000 Encounter for general adult medical examination without abnormal findings: Secondary | ICD-10-CM

## 2019-06-28 DIAGNOSIS — F419 Anxiety disorder, unspecified: Secondary | ICD-10-CM

## 2019-06-28 DIAGNOSIS — E1169 Type 2 diabetes mellitus with other specified complication: Secondary | ICD-10-CM | POA: Insufficient documentation

## 2019-06-28 NOTE — Assessment & Plan Note (Signed)
Chronic, ongoing.  Continue daily Citalopram and monitor.  Adjust dose or medication as needed.  Denies SI/HI.

## 2019-06-28 NOTE — Assessment & Plan Note (Signed)
Chronic, ongoing with current improving cellulitis to right lower leg.  Recommend returning to massage clinic as soon as able to and continued use of compression hose daily.  Continue collaboration with vascular team.  Appreciate their input.

## 2019-06-28 NOTE — Assessment & Plan Note (Signed)
Will recheck A1C today and recommend focus on diet and regular exercise.

## 2019-06-28 NOTE — Assessment & Plan Note (Signed)
Chronic, ongoing.  Continue current medication regimen and adjust as needed.  May benefit from change to [facility] for once daily dosing and LAMA/LABA combo.  Obtain spirometry next visit.

## 2019-06-28 NOTE — Progress Notes (Signed)
BP 108/71    Pulse 78 Comment: apical   Temp 98.5 F (36.9 C) (Oral)    Ht _0  (1.727 m)    Wt 226 lb (102.5 kg)    LMP  (LMP Unknown)    SpO2 97%    BMI 34.36 kg/m    Subjective:    Patient ID: Sharon [name], female    DOB: 10/13/1946, 73 y.o.   MRN: [id number]  HPI: Sharon [name] is a 73 y.o. female presenting on 06/28/2019 for comprehensive medical examination. Current medical complaints include:none  She currently lives with: husband Menopausal Symptoms: no   COPD Continues on Symbicort daily and Albuterol PRN.   COPD status: stable Satisfied with current treatment?: yes Oxygen use: no Dyspnea frequency: intermittent Cough frequency: intermittent Rescue inhaler frequency:   Limitation of activity: no Productive cough: none Last Spirometry: unknown Pneumovax: Up to Date Influenza: Up to Date   LYMPHEDEMA WITH CHRONIC VENOUS INSUFFICIENCY: With current cellulitis to right leg, which is improving.  Saw vascular last 06/22/2019 and returns tomorrow for follow-up.  Was treated with abx and Prednisone + Unna wraps.  Currently unable to go to her massage therapist which she reports benefit from.  Has noted vast improvement in leg.    ANXIETY Continues on Citalopram 20 MG daily. Mood status: stable Satisfied with current treatment?: yes Symptom severity: mild  Duration of current treatment : chronic Side effects: no Medication compliance: good compliance Psychotherapy/counseling: none Depressed mood: no Anxious mood: no Anhedonia: no Significant weight loss or gain: no Insomnia: no hard to stay asleep Fatigue: no Feelings of worthlessness or guilt: no Impaired concentration/indecisiveness: no Suicidal ideations: no Hopelessness: no Crying spells: no  GAD 7 : Generalized Anxiety Score 12/07/2018  Nervous, Anxious, on Edge 0  Control/stop worrying 0  Worry too much - different things 0  Trouble relaxing 0  Restless 0  Easily annoyed or irritable 0  Afraid - awful  might happen 0  Total GAD 7 Score 0    Depression Screen done today and results listed below:  Depression screen [facility] 2/9 06/28/2019 04/07/2019 12/21/2018 12/07/2018 01/14/2018  Decreased Interest 1 0 0 0 1  Down, Depressed, Hopeless 1 1 0 0 1  PHQ - 2 Score 2 1 0 0 2  Altered sleeping 0 - 0 0 0  Tired, decreased energy 1 - 0 0 1  Change in appetite 0 - 0 0 1  Feeling bad or failure about yourself  0 - 0 0 0  Trouble concentrating 0 - 0 0 0  Moving slowly or fidgety/restless 0 - 0 0 0  Suicidal thoughts 0 - 0 0 0  PHQ-9 Score 3 - 0 0 4  Difficult doing work/chores Not difficult at all - Not difficult at all Not difficult at all Not difficult at all  Some recent data might be hidden    The patient has a history of falls. I did complete a risk assessment for falls. A plan of care for falls was documented.  Fall Risk  06/28/2019 04/07/2019 02/17/2019 12/07/2018 07/28/2018  Falls in the past year? _1 No  Number falls in past yr: 0 0 0 0 -  Injury with Fall? 0 _2 -  Risk for fall due to : History of fall(s) Impaired balance/gait History of fall(s) - -  Follow up Falls prevention discussed;Education provided - Falls evaluation completed - -   Past Medical History:  Past Medical History:  Diagnosis  Date   Anxiety    Arthritis    hands, upper back   Asthma    COPD (chronic obstructive pulmonary disease) ([place])    History of cervical cancer    Menopausal disorder    Osteoporosis    Pneumonia 1960   Spasm of abdominal muscles of right side    intermittent   TMJ (dislocation of temporomandibular joint)    Wears dentures    partial lower    Surgical History:  Past Surgical History:  Procedure Laterality Date   ABDOMINAL HYSTERECTOMY  1970's   bladder botox  2005   BLADDER SUSPENSION  2004   CATARACT EXTRACTION W/PHACO Right 03/09/2018   Procedure: CATARACT EXTRACTION PHACO AND INTRAOCULAR LENS PLACEMENT ([place]) right;  Surgeon: Eulogio Bear, MD;  Location:  [place];  Service: Ophthalmology;  Laterality: Right;  CALL CELL 1ST   CATARACT EXTRACTION W/PHACO Left 04/19/2018   Procedure: CATARACT EXTRACTION PHACO AND INTRAOCULAR LENS PLACEMENT ([facility])  LEFT;  Surgeon: Eulogio Bear, MD;  Location: [place];  Service: Ophthalmology;  Laterality: Left;   COLONOSCOPY WITH PROPOFOL N/A 12/13/2015   Procedure: COLONOSCOPY WITH PROPOFOL;  Surgeon: Lucilla Lame, MD;  Location: [place];  Service: Endoscopy;  Laterality: N/A;   LOOP RECORDER INSERTION N/A 01/07/2018   Procedure: LOOP RECORDER INSERTION;  Surgeon: Deboraha Sprang, MD;  Location: [place] [facility];  Service: Cardiovascular;  Laterality: N/A;   POLYPECTOMY N/A 12/13/2015   Procedure: POLYPECTOMY;  Surgeon: Lucilla Lame, MD;  Location: [place];  Service: Endoscopy;  Laterality: N/A;  SIGMOID COLON POLYPS X  5   SHOULDER ARTHROSCOPY W/ ROTATOR CUFF REPAIR Right 1998   TEE WITHOUT CARDIOVERSION N/A 01/06/2018   Procedure: TRANSESOPHAGEAL ECHOCARDIOGRAM (TEE);  Surgeon: Minna Merritts, MD;  Location: [facility];  Service: Cardiovascular;  Laterality: N/A;   TONSILLECTOMY AND ADENOIDECTOMY      Medications:  Current Outpatient Medications on File Prior to Visit  Medication Sig   albuterol (PROAIR HFA) 108 (90 Base) MCG/ACT inhaler Inhale 2 puffs into the lungs every 6 (six) hours as needed for wheezing or shortness of breath.   aspirin 81 MG chewable tablet Chew by mouth daily.   atorvastatin (LIPITOR) 40 MG tablet TAKE 1 TABLET BY MOUTH ONCE A DAY AT 6 PM   budesonide-formoterol (SYMBICORT) 160-4.5 MCG/ACT inhaler USE 2 PUFFS BY MOUTH TWICE DAILY.   citalopram (CELEXA) 20 MG tablet Take 1 tablet (20 mg total) by mouth daily.   clopidogrel (PLAVIX) 75 MG tablet TAKE 1 TABLET(75 MG) BY MOUTH DAILY   Cyanocobalamin 1000 MCG/ML KIT Inject 1,000 mcg as directed every 30 (thirty) days.   estradiol (VIVELLE-DOT) 0.1 MG/24HR patch APPLY 1 PATCH  ONTO THE SKIN 2 TIMES A WEEK   meclizine (ANTIVERT) 12.5 MG tablet Take 1 tablet (12.5 mg total) by mouth 3 (three) times daily as needed for dizziness (only if needed).   Multiple Vitamin (MULTIVITAMIN) tablet Take 2 tablets by mouth daily.    pantoprazole (PROTONIX) 40 MG tablet Take 1 tablet (40 mg total) by mouth daily.   triamcinolone cream (KENALOG) 0.1 % Apply 1 application topically 2 (two) times daily.   Loratadine (CLARITIN PO) Take by mouth daily.   montelukast (SINGULAIR) 10 MG tablet Take 1 tablet (10 mg total) by mouth at bedtime. (Patient not taking: Reported on 06/28/2019)   No current facility-administered medications on file prior to visit.     Allergies:  Allergies  Allergen Reactions   Baclofen  Swelling   Librium [Chlordiazepoxide] Itching    Dizziness    Bactrim [Sulfamethoxazole-Trimethoprim] Nausea And Vomiting   Ibuprofen Rash    Mouth swelling   Latex Rash    Some bandaids, some gloves; BLOOD TEST NEGATIVE   Naprosyn [Naproxen] Rash    Mouth swelling   Other Rash    [place] nuts - mouth swelling    Social History:  Social History   Socioeconomic History   Marital status: Married    Spouse name: Thera Basden   Number of children: 1   Years of education: Not on file   Highest education level: Some college, no degree  Occupational History   Occupation: retired  Scientist, product/process development strain: Not hard at [employer]    Worry: Never true    Inability: Never true   Transportation needs    Medical: No    Non-medical: No  Tobacco Use   Smoking status: Former Smoker    Packs/day: 0.60    Years: 56.00    Pack years: 33.60    Types: Cigarettes    Quit date: 07/14/2016    Years since quitting: 2.9   Smokeless tobacco: Never Used   Tobacco comment: has smoked off and on  Substance and Sexual Activity   Alcohol use: Yes    Alcohol/week: 0.0 standard drinks    Comment: occasionally - special occasions    Drug use: No   Sexual activity: Not Currently    Birth control/protection: Post-menopausal  Lifestyle   Physical activity    Days per week: 7 days    Minutes per session: 10 min   Stress: Very much  Relationships   Social connections    Talks on phone: More than three times a week    Gets together: Once a week    Attends religious service: 1 to 4 times per year    Active member of club or organization: No    Attends meetings of clubs or organizations: Never    Relationship status: Married   Intimate partner violence    Fear of current or ex partner: No    Emotionally abused: No    Physically abused: No    Forced sexual activity: No  Other Topics Concern   Not on file  Social History Narrative   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, manages farm   Social History   Tobacco Use  Smoking Status Former Smoker   Packs/day: 0.60   Years: 56.00   Pack years: 33.60   Types: Cigarettes   Quit date: 07/14/2016   Years since quitting: 2.9  Smokeless Tobacco Never Used  Tobacco Comment   has smoked off and on   Social History   Substance and Sexual Activity  Alcohol Use Yes   Alcohol/week: 0.0 standard drinks   Comment: occasionally - special occasions    Family History:  Family History  Problem Relation Age of Onset   Diabetes Mother    Heart disease Mother    Stroke Mother    Stroke Maternal Grandmother     Past medical history, surgical history, medications, allergies, family history and social history reviewed with patient today and changes made to appropriate areas of the chart.   Review of Systems - negative All other ROS negative except what is listed above and in the HPI.      Objective:    BP 108/71    Pulse 78 Comment: apical   Temp 98.5 F (36.9 C) (Oral)  Ht _0  (1.727 m)    Wt 226 lb (102.5 kg)    LMP  (LMP Unknown)    SpO2 97%    BMI 34.36 kg/m   Wt Readings from Last 3 Encounters:  06/28/19 226 lb (102.5 kg)  06/22/19 223 lb 6.4 oz (101.3 kg)   06/15/19 226 lb (102.5 kg)    Physical Exam Vitals signs and nursing note reviewed.  Constitutional:      General: She is awake. She is not in acute distress.    Appearance: She is well-developed. She is obese. She is not ill-appearing.  HENT:     Head: Normocephalic.     Right Ear: Hearing, tympanic membrane, ear canal and external ear normal. No drainage.     Left Ear: Hearing, tympanic membrane, ear canal and external ear normal. No drainage.     Nose: Nose normal.     Mouth/Throat:     Mouth: Mucous membranes are moist.     Pharynx: Oropharynx is clear. Uvula midline.  Eyes:     General: Lids are normal.        Right eye: No discharge.        Left eye: No discharge.     Extraocular Movements: Extraocular movements intact.     Conjunctiva/sclera: Conjunctivae normal.     Pupils: Pupils are equal, round, and reactive to light.     Visual Fields: Right eye visual fields normal and left eye visual fields normal.  Neck:     Musculoskeletal: Normal range of motion and neck supple.     Thyroid: No thyromegaly.     Vascular: No carotid bruit.  Cardiovascular:     Rate and Rhythm: Normal rate and regular rhythm.     Heart sounds: Normal heart sounds. No murmur. No gallop.   Pulmonary:     Effort: Pulmonary effort is normal. No accessory muscle usage or respiratory distress.     Breath sounds: Normal breath sounds.  Chest:     Breasts:        Right: Normal. No swelling, bleeding, inverted nipple, mass, nipple discharge, skin change or tenderness.        Left: Normal. No swelling, bleeding, inverted nipple, mass, nipple discharge, skin change or tenderness.  Abdominal:     General: Bowel sounds are normal.     Palpations: Abdomen is soft. There is no hepatomegaly or splenomegaly.     Tenderness: There is no abdominal tenderness.  Musculoskeletal:     Right lower leg: 2+ Edema present.     Left lower leg: 2+ Edema present.  Lymphadenopathy:     Head:     Right side of head: No  submental, submandibular, tonsillar, preauricular or posterior auricular adenopathy.     Left side of head: No submental, submandibular, tonsillar, preauricular or posterior auricular adenopathy.     Cervical: No cervical adenopathy.     Upper Body:     Right upper body: No supraclavicular, axillary or pectoral adenopathy.     Left upper body: No supraclavicular, axillary or pectoral adenopathy.  Skin:    General: Skin is warm and dry.     Capillary Refill: Capillary refill takes less than 2 seconds.     Findings: No rash.  Neurological:     Mental Status: She is alert and oriented to person, place, and time.     Cranial Nerves: Cranial nerves are intact.     Gait: Gait is intact.     Deep Tendon Reflexes: Reflexes  are normal and symmetric.     Reflex Scores:      Brachioradialis reflexes are 2+ on the right side and 2+ on the left side.      Patellar reflexes are 2+ on the right side and 2+ on the left side. Psychiatric:        Attention and Perception: Attention normal.        Mood and Affect: Mood normal.        Speech: Speech normal.        Behavior: Behavior normal. Behavior is cooperative.        Thought Content: Thought content normal.        Judgment: Judgment normal.     Results for orders placed or performed in visit on 06/16/19  CUP PACEART REMOTE DEVICE CHECK  Result Value Ref Range   Date Time Interrogation Session [id number]    Pulse Generator Manufacturer MERM    Pulse Gen Model WPY09 Reveal LINQ    Pulse Gen Serial Number [id number]    Clinic Name [facility]    Implantable Pulse Generator Type ICM/ILR    Implantable Pulse Generator Implant Date [id number]       Assessment & Plan:   Problem List Items Addressed This Visit      Cardiovascular and Mediastinum   Senile purpura ([place])    On daily Plavix.  Recommend cleansing skin with gentle cleanser and use of daily lotion.  Monitor for skin breakdown and address if present.        Respiratory    COPD (chronic obstructive pulmonary disease) ([facility])    Chronic, ongoing.  Continue current medication regimen and adjust as needed.  May benefit from change to [facility] for once daily dosing and LAMA/LABA combo.  Obtain spirometry next visit.        Other   Anxiety    Chronic, ongoing.  Continue daily Citalopram and monitor.  Adjust dose or medication as needed.  Denies SI/HI.      Menopause    Have discussed with her at length benefit of discontinuation of Estradiol patch, has been on for several years.  At this time she wishes to continue.  Will continue to reiterate benefit of reduction to discontinuation of medication, as has been on > 4-5 years.  She reports she continues to have hot flashes.      Lymphedema    Chronic, ongoing with current improving cellulitis to right lower leg.  Recommend returning to massage clinic as soon as able to and continued use of compression hose daily.  Continue collaboration with vascular team.  Appreciate their input.      Vitamin B12 deficiency    Recheck B12 level today and continue injections.      Relevant Orders   Vitamin B12   Vitamin D deficiency    Recheck Vitamin D level today.  Recommend daily Vitamin D3 supplement, 1000 units.      Relevant Orders   VITAMIN D 25 Hydroxy (Vit-D Deficiency, Fractures)   Prediabetes    Will recheck A1C today and recommend focus on diet and regular exercise.      Relevant Orders   HgB A1c    Other Visit Diagnoses    Encounter for annual physical exam    -  Primary   Relevant Orders   CBC with Differential/Platelet   Comprehensive metabolic panel   Lipid Panel w/o Chol/HDL Ratio   TSH   Need for influenza vaccination  Follow up plan: Return in about 2 months (around 08/28/2019) for COPD, B12, Lymphedema.   LABORATORY TESTING:  - Pap smear: not applicable  IMMUNIZATIONS:   - Tdap: Tetanus vaccination status reviewed: last tetanus booster within 10 years. - Influenza: Postponed to  flu season - Pneumovax: Up to date - Prevnar: Up to date - HPV: Not applicable - Zostavax vaccine: Refused  SCREENING: -Mammogram: Up to date  - Colonoscopy: Up to date  - Bone Density: Refused  -Hearing Test: Not applicable  -Spirometry: Not applicable   PATIENT COUNSELING:   Advised to take 1 mg of folate supplement per day if capable of pregnancy.   Sexuality: Discussed sexually transmitted diseases, partner selection, use of condoms, avoidance of unintended pregnancy  and contraceptive alternatives.   Advised to avoid cigarette smoking.  I discussed with the patient that most people either abstain from alcohol or drink within safe limits (<=14/week and <=4 drinks/occasion for males, <=7/weeks and <= 3 drinks/occasion for females) and that the risk for alcohol disorders and other health effects rises proportionally with the number of drinks per week and how often a drinker exceeds daily limits.  Discussed cessation/primary prevention of drug use and availability of treatment for abuse.   Diet: Encouraged to adjust caloric intake to maintain  or achieve ideal body weight, to reduce intake of dietary saturated fat and total fat, to limit sodium intake by avoiding high sodium foods and not adding table salt, and to maintain adequate dietary potassium and calcium preferably from fresh fruits, vegetables, and low-fat dairy products.    stressed the importance of regular exercise  Injury prevention: Discussed safety belts, safety helmets, smoke detector, smoking near bedding or upholstery.   Dental health: Discussed importance of regular tooth brushing, flossing, and dental visits.    NEXT PREVENTATIVE PHYSICAL DUE IN 1 YEAR. Return in about 2 months (around 08/28/2019) for COPD, B12, Lymphedema.

## 2019-06-28 NOTE — Assessment & Plan Note (Signed)
Recheck B12 level today and continue injections.

## 2019-06-28 NOTE — Patient Instructions (Signed)

## 2019-06-28 NOTE — Assessment & Plan Note (Signed)
On daily Plavix.  Recommend cleansing skin with gentle cleanser and use of daily lotion.  Monitor for skin breakdown and address if present. 

## 2019-06-28 NOTE — Assessment & Plan Note (Signed)
Recheck Vitamin D level today.  Recommend daily Vitamin D3 supplement, 1000 units. 

## 2019-06-28 NOTE — Assessment & Plan Note (Signed)
Have discussed with her at length benefit of discontinuation of Estradiol patch, has been on for several years.  At this time she wishes to continue.  Will continue to reiterate benefit of reduction to discontinuation of medication, as has been on > 4-5 years.  She reports she continues to have hot flashes.

## 2019-06-29 ENCOUNTER — Other Ambulatory Visit: Payer: Self-pay | Admitting: Nurse Practitioner

## 2019-06-29 ENCOUNTER — Ambulatory Visit (INDEPENDENT_AMBULATORY_CARE_PROVIDER_SITE_OTHER): Admitting: Nurse Practitioner

## 2019-06-29 ENCOUNTER — Encounter (INDEPENDENT_AMBULATORY_CARE_PROVIDER_SITE_OTHER): Payer: Self-pay | Admitting: Nurse Practitioner

## 2019-06-29 VITALS — BP 135/83 | HR 99 | Resp 14 | Ht 69.0 in | Wt 224.0 lb

## 2019-06-29 DIAGNOSIS — I890 Lymphedema, not elsewhere classified: Secondary | ICD-10-CM

## 2019-06-29 LAB — COMPREHENSIVE METABOLIC PANEL
ALT: 21 IU/L (ref 0–32)
AST: 24 IU/L (ref 0–40)
Albumin/Globulin Ratio: 1.7 (ref 1.2–2.2)
Albumin: 4.3 g/dL (ref 3.7–4.7)
Alkaline Phosphatase: 75 IU/L (ref 39–117)
BUN/Creatinine Ratio: 12 (ref 12–28)
BUN: 13 mg/dL (ref 8–27)
Bilirubin Total: 0.6 mg/dL (ref 0.0–1.2)
CO2: 25 mmol/L (ref 20–29)
Calcium: 9.4 mg/dL (ref 8.7–10.3)
Chloride: 98 mmol/L (ref 96–106)
Creatinine, Ser: 1.1 mg/dL — ABNORMAL HIGH (ref 0.57–1.00)
GFR calc Af Amer: 58 mL/min/{1.73_m2} — ABNORMAL LOW (ref >59)
GFR calc non Af Amer: 50 mL/min/{1.73_m2} — ABNORMAL LOW (ref >59)
Globulin, Total: 2.6 g/dL (ref 1.5–4.5)
Glucose: 103 mg/dL — ABNORMAL HIGH (ref 65–99)
Potassium: 4.5 mmol/L (ref 3.5–5.2)
Sodium: 141 mmol/L (ref 134–144)
Total Protein: 6.9 g/dL (ref 6.0–8.5)

## 2019-06-29 LAB — LIPID PANEL W/O CHOL/HDL RATIO
Cholesterol, Total: 145 mg/dL (ref 100–199)
HDL: 60 mg/dL (ref >39)
LDL Chol Calc (NIH): 70 mg/dL (ref 0–99)
Triglycerides: 79 mg/dL (ref 0–149)
VLDL Cholesterol Cal: 15 mg/dL (ref 5–40)

## 2019-06-29 LAB — CBC WITH DIFFERENTIAL/PLATELET
Basophils Absolute: 0 10*3/uL (ref 0.0–0.2)
Basos: 0 %
EOS (ABSOLUTE): 0.1 10*3/uL (ref 0.0–0.4)
Eos: 1 %
Hematocrit: 44.6 % (ref 34.0–46.6)
Hemoglobin: 14.4 g/dL (ref 11.1–15.9)
Immature Grans (Abs): 0 10*3/uL (ref 0.0–0.1)
Immature Granulocytes: 0 %
Lymphocytes Absolute: 1.3 10*3/uL (ref 0.7–3.1)
Lymphs: 17 %
MCH: 29.4 pg (ref 26.6–33.0)
MCHC: 32.3 g/dL (ref 31.5–35.7)
MCV: 91 fL (ref 79–97)
Monocytes Absolute: 0.7 10*3/uL (ref 0.1–0.9)
Monocytes: 9 %
Neutrophils Absolute: 5.7 10*3/uL (ref 1.4–7.0)
Neutrophils: 73 %
Platelets: 250 10*3/uL (ref 150–450)
RBC: 4.9 x10E6/uL (ref 3.77–5.28)
RDW: 12.9 % (ref 11.7–15.4)
WBC: 7.7 10*3/uL (ref 3.4–10.8)

## 2019-06-29 LAB — VITAMIN D 25 HYDROXY (VIT D DEFICIENCY, FRACTURES): Vit D, 25-Hydroxy: 18.1 ng/mL — ABNORMAL LOW (ref 30.0–100.0)

## 2019-06-29 LAB — TSH: TSH: 1.12 u[IU]/mL (ref 0.450–4.500)

## 2019-06-29 LAB — HEMOGLOBIN A1C
Est. average glucose Bld gHb Est-mCnc: 143 mg/dL
Hgb A1c MFr Bld: 6.6 % — ABNORMAL HIGH (ref 4.8–5.6)

## 2019-06-29 LAB — VITAMIN B12: Vitamin B-12: 1289 pg/mL — ABNORMAL HIGH (ref 232–1245)

## 2019-06-29 MED ORDER — BUDESONIDE-FORMOTEROL FUMARATE 160-4.5 MCG/ACT IN AERO: INHALATION_SPRAY | RESPIRATORY_TRACT | 6 refills | Status: DC

## 2019-06-29 NOTE — Progress Notes (Signed)
History of Present Illness  There is no documented history at this time  Assessments & Plan   There are no diagnoses linked to this encounter.    Additional instructions  Subjective:  Patient presents with venous ulcer of the Right lower extremity.    Procedure:  3 layer unna wrap was placed Right lower extremity.   Plan:   Follow up in one week.   

## 2019-06-30 NOTE — Progress Notes (Signed)
Carelink Summary Report / Loop Recorder 

## 2019-07-05 ENCOUNTER — Other Ambulatory Visit (INDEPENDENT_AMBULATORY_CARE_PROVIDER_SITE_OTHER): Payer: Self-pay | Admitting: Nurse Practitioner

## 2019-07-05 ENCOUNTER — Ambulatory Visit (INDEPENDENT_AMBULATORY_CARE_PROVIDER_SITE_OTHER): Payer: Medicare Other | Admitting: Vascular Surgery

## 2019-07-05 DIAGNOSIS — L03115 Cellulitis of right lower limb: Secondary | ICD-10-CM

## 2019-07-05 DIAGNOSIS — Z1231 Encounter for screening mammogram for malignant neoplasm of breast: Secondary | ICD-10-CM | POA: Diagnosis not present

## 2019-07-05 DIAGNOSIS — I890 Lymphedema, not elsewhere classified: Secondary | ICD-10-CM

## 2019-07-05 LAB — HM MAMMOGRAPHY

## 2019-07-06 ENCOUNTER — Other Ambulatory Visit: Payer: Self-pay

## 2019-07-06 ENCOUNTER — Ambulatory Visit (INDEPENDENT_AMBULATORY_CARE_PROVIDER_SITE_OTHER)

## 2019-07-06 ENCOUNTER — Ambulatory Visit: Payer: Self-pay | Admitting: Licensed Clinical Social Worker

## 2019-07-06 ENCOUNTER — Ambulatory Visit (INDEPENDENT_AMBULATORY_CARE_PROVIDER_SITE_OTHER): Admitting: Nurse Practitioner

## 2019-07-06 ENCOUNTER — Telehealth: Payer: Self-pay

## 2019-07-06 VITALS — BP 108/70 | HR 93 | Resp 16 | Wt 223.0 lb

## 2019-07-06 DIAGNOSIS — I890 Lymphedema, not elsewhere classified: Secondary | ICD-10-CM | POA: Diagnosis not present

## 2019-07-06 DIAGNOSIS — L03115 Cellulitis of right lower limb: Secondary | ICD-10-CM | POA: Diagnosis not present

## 2019-07-06 DIAGNOSIS — K219 Gastro-esophageal reflux disease without esophagitis: Secondary | ICD-10-CM

## 2019-07-06 DIAGNOSIS — J449 Chronic obstructive pulmonary disease, unspecified: Secondary | ICD-10-CM | POA: Diagnosis not present

## 2019-07-06 NOTE — Chronic Care Management (AMB) (Signed)
  Care Management   Follow Up Note   07/06/2019 Name: Sharon [name] MRN: [id number] DOB: 02-08-1946  Referred by: [name], NP Reason for referral : [name] is a 73 y.o. year old female who is a primary care patient of [name], [name], NP. The care management team was consulted for assistance with care management and care coordination needs.    Review of patient status, including review of consultants reports, relevant laboratory and other test results, and collaboration with appropriate care team members and the patient's provider was performed as part of comprehensive patient evaluation and provision of chronic care management services.    LCSW completed CCM outreach attempt today but was unable to reach patient successfully. A HIPPA compliant voice message was left encouraging patient to return call once available. Patient has other scheduled medical appointments for today which may be the reason why LCSW was unable to reach. LCSW rescheduled CCM SW appointment as well.  A HIPPA compliant phone message was left for the patient providing contact information and requesting a return call.   [name], BSW, MSW, [facility] Care [facility] [email or web address] Phone: [phone]

## 2019-07-11 ENCOUNTER — Encounter (INDEPENDENT_AMBULATORY_CARE_PROVIDER_SITE_OTHER): Payer: Self-pay | Admitting: Nurse Practitioner

## 2019-07-11 NOTE — Progress Notes (Signed)
SUBJECTIVE:  Patient ID: Sharon [name], female    DOB: Feb 20, 1946, 73 y.o.   MRN: [id number] Chief Complaint  Patient presents with  . Follow-up    ultrasound and unna follow up    HPI  Sharon [name] is a 73 y.o. female that presents today after 4 weeks of Unna wraps following right lower extremity edema and cellulitis.  Initially the patient's cellulitis was severe which required IV antibiotics and several rounds of oral antibiotics.  The patient tolerated the Unna wraps well.  Today her leg feels much better although she does endorse some itchiness.  Otherwise, she denies any fever, chills, nausea, vomiting or diarrhea.  She denies any chest pain shortness of breath.  She denies any TIA-like symptoms.  Cellulitis is resolved.  Today the patient underwent noninvasive studies which revealed no evidence of chronic venous insufficiency, DVT or superficial venous thrombosis bilaterally.  There does appear to be a Baker's cyst behind the left knee area.  Past Medical History:  Diagnosis Date  . Anxiety   . Arthritis    hands, upper back  . Asthma   . COPD (chronic obstructive pulmonary disease) ([place])   . History of cervical cancer   . Menopausal disorder   . Osteoporosis   . Pneumonia 1960  . Spasm of abdominal muscles of right side    intermittent  . TMJ (dislocation of temporomandibular joint)   . Wears dentures    partial lower    Past Surgical History:  Procedure Laterality Date  . ABDOMINAL HYSTERECTOMY  1970's  . bladder botox  2005  . BLADDER SUSPENSION  2004  . CATARACT EXTRACTION W/PHACO Right 03/09/2018   Procedure: CATARACT EXTRACTION PHACO AND INTRAOCULAR LENS PLACEMENT ([place]) right;  Surgeon: Eulogio Bear, MD;  Location: [facility];  Service: Ophthalmology;  Laterality: Right;  CALL CELL 1ST  . CATARACT EXTRACTION W/PHACO Left 04/19/2018   Procedure: CATARACT EXTRACTION PHACO AND INTRAOCULAR LENS PLACEMENT ([facility])  LEFT;  Surgeon: Eulogio Bear, MD;   Location: [place];  Service: Ophthalmology;  Laterality: Left;  . COLONOSCOPY WITH PROPOFOL N/A 12/13/2015   Procedure: COLONOSCOPY WITH PROPOFOL;  Surgeon: Lucilla Lame, MD;  Location: [facility];  Service: Endoscopy;  Laterality: N/A;  . LOOP RECORDER INSERTION N/A 01/07/2018   Procedure: LOOP RECORDER INSERTION;  Surgeon: Deboraha Sprang, MD;  Location: [facility];  Service: Cardiovascular;  Laterality: N/A;  . POLYPECTOMY N/A 12/13/2015   Procedure: POLYPECTOMY;  Surgeon: Lucilla Lame, MD;  Location: [place];  Service: Endoscopy;  Laterality: N/A;  SIGMOID COLON POLYPS X  5  . SHOULDER ARTHROSCOPY W/ ROTATOR CUFF REPAIR Right 1998  . TEE WITHOUT CARDIOVERSION N/A 01/06/2018   Procedure: TRANSESOPHAGEAL ECHOCARDIOGRAM (TEE);  Surgeon: Minna Merritts, MD;  Location: [facility];  Service: Cardiovascular;  Laterality: N/A;  . TONSILLECTOMY AND ADENOIDECTOMY      Social History   Socioeconomic History  . Marital status: Married    Spouse name: Brande Uncapher  . Number of children: 1  . Years of education: Not on file  . Highest education level: Some college, no degree  Occupational History  . Occupation: retired  Scientific laboratory technician  . Financial resource strain: Not hard at all  . Food insecurity    Worry: Never true    Inability: Never true  . Transportation needs    Medical: No    Non-medical: No  Tobacco Use  . Smoking status: Former Smoker  Packs/day: 0.60    Years: 56.00    Pack years: 33.60    Types: Cigarettes    Quit date: 07/14/2016    Years since quitting: 2.9  . Smokeless tobacco: Never Used  . Tobacco comment: has smoked off and on  Substance and Sexual Activity  . Alcohol use: Yes    Alcohol/week: 0.0 standard drinks    Comment: occasionally - special occasions  . Drug use: No  . Sexual activity: Not Currently    Birth control/protection: Post-menopausal  Lifestyle  . Physical activity    Days per week: 7 days    Minutes per  session: 10 min  . Stress: Very much  Relationships  . Social connections    Talks on phone: More than three times a week    Gets together: Once a week    Attends religious service: 1 to 4 times per year    Active member of club or organization: No    Attends meetings of clubs or organizations: Never    Relationship status: Married  . Intimate partner violence    Fear of current or ex partner: No    Emotionally abused: No    Physically abused: No    Forced sexual activity: No  Other Topics Concern  . 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 Diabetes Mother   . Heart disease Mother   . Stroke Mother   . Stroke Maternal Grandmother     Allergies  Allergen Reactions  . Baclofen Swelling  . Librium [Chlordiazepoxide] Itching    Dizziness   . Bactrim [Sulfamethoxazole-Trimethoprim] Nausea And Vomiting  . Ibuprofen Rash    Mouth swelling  . Latex Rash    Some bandaids, some gloves; BLOOD TEST NEGATIVE  . Naprosyn [Naproxen] Rash    Mouth swelling  . Other Rash    [place] nuts - mouth swelling     Review of Systems   Review of Systems: Negative Unless Checked Constitutional: [] Weight loss  [] Fever  [] Chills Cardiac: [] Chest pain   []  Atrial Fibrillation  [] Palpitations   [] Shortness of breath when laying flat   [] Shortness of breath with exertion. [] Shortness of breath at rest Vascular:  [] Pain in legs with walking   [] Pain in legs with standing [] Pain in legs when laying flat   [] Claudication    [] Pain in feet when laying flat    [] History of DVT   [] Phlebitis   [x] Swelling in legs   [] Varicose veins   [] Non-healing ulcers Pulmonary:   [] Uses home oxygen   [] Productive cough   [] Hemoptysis   [] Wheeze  [x] COPD   [] Asthma Neurologic:  [] Dizziness   [] Seizures  [] Blackouts [x] History of stroke   [x] History of TIA  [] Aphasia   [] Temporary Blindness   [] Weakness or numbness in arm    [] Weakness or numbness in leg Musculoskeletal:   [] Joint swelling   [] Joint pain   [] Low back pain  []  History of Knee Replacement [x] Arthritis [] back Surgeries  []  Spinal Stenosis    Hematologic:  [] Easy bruising  [] Easy bleeding   [] Hypercoagulable state   [] Anemic Gastrointestinal:  [] Diarrhea   [] Vomiting  [x] Gastroesophageal reflux/heartburn   [] Difficulty swallowing. [] Abdominal pain Genitourinary:  [] Chronic kidney disease   [] Difficult urination  [] Anuric   [] Blood in urine [] Frequent urination  [] Burning with urination   [] Hematuria Skin:  [] Rashes   [] Ulcers [] Wounds Psychological:  [x] History of anxiety   []   History of major depression  []  Memory Difficulties      OBJECTIVE:   Physical Exam  BP 108/70 (BP Location: Right Arm)   Pulse 93   Resp 16   Wt 223 lb (101.2 kg)   LMP  (LMP Unknown)   BMI 32.93 kg/m   Gen: WD/WN, NAD Head: [place]/AT, No temporalis wasting.  Ear/Nose/Throat: Hearing grossly intact, nares w/o erythema or drainage Eyes: PER, EOMI, sclera nonicteric.  Neck: Supple, no masses.  No JVD.  Pulmonary:  Good air movement, no use of accessory muscles.  Cardiac: RRR Vascular:  1+ edema bilaterally Vessel Right Left  Radial Palpable Palpable  Dorsalis Pedis Palpable Palpable  Posterior Tibial Palpable Palpable   Gastrointestinal: soft, non-distended. No guarding/no peritoneal signs.  Musculoskeletal: M/S 5/5 throughout.  No deformity or atrophy.  Neurologic: Pain and light touch intact in extremities.  Symmetrical.  Speech is fluent. Motor exam as listed above. Psychiatric: Judgment intact, Mood & affect appropriate for pt's clinical situation. Dermatologic: No Venous rashes. No Ulcers Noted.  No changes consistent with cellulitis. Lymph : No Cervical lymphadenopathy, dermal thickening bilaterally   ASSESSMENT AND PLAN:  1. Lymphedema Currently the patient swelling is doing much better.  The patient does wear medical grade 1 compression stockings prior to  her Unna wraps.  I have advised the patient to go back into the medical grade 1 compression stockings as well as to use elevation and exercise to assist with swelling.  Patient currently also has a lymphedema pump which she will utilize 30 minutes, morning and evening on alternating legs.  And this is to try to relieve some of the possible pressure due to her pulmonary hypertension.  We will see the patient back in 3 months to evaluate her edema.  If the patient feels that she may have developed cellulitis prior to her next office visit or she has any other further questions or concerns she is welcome to give [place] a call.  2. Gastroesophageal reflux disease without esophagitis Continue PPI as already ordered, this medication has been reviewed and there are no changes at this time.  Avoidence of caffeine and alcohol  Moderate elevation of the head of the bed   3. Chronic obstructive pulmonary disease, unspecified COPD type ([place]) Continue pulmonary medications and aerosols as already ordered, these medications have been reviewed and there are no changes at this time.     Current Outpatient Medications on File Prior to Visit  Medication Sig Dispense Refill  . albuterol (PROAIR HFA) 108 (90 Base) MCG/ACT inhaler Inhale 2 puffs into the lungs every 6 (six) hours as needed for wheezing or shortness of breath. 8.5 Inhaler 12  . aspirin 81 MG chewable tablet Chew by mouth daily.    Marland Kitchen atorvastatin (LIPITOR) 40 MG tablet TAKE 1 TABLET BY MOUTH ONCE A DAY AT 6 PM 90 tablet 3  . budesonide-formoterol (SYMBICORT) 160-4.5 MCG/ACT inhaler USE 2 PUFFS BY MOUTH TWICE DAILY. 10.2 g 6  . citalopram (CELEXA) 20 MG tablet Take 1 tablet (20 mg total) by mouth daily. 90 tablet 3  . clopidogrel (PLAVIX) 75 MG tablet TAKE 1 TABLET(75 MG) BY MOUTH DAILY 90 tablet 2  . Cyanocobalamin 1000 MCG/ML KIT Inject 1,000 mcg as directed every 30 (thirty) days.    Marland Kitchen estradiol (VIVELLE-DOT) 0.1 MG/24HR patch APPLY 1 PATCH ONTO THE  SKIN 2 TIMES A WEEK 8 patch 11  . Loratadine (CLARITIN PO) Take by mouth daily.    . meclizine (ANTIVERT) 12.5 MG tablet  Take 1 tablet (12.5 mg total) by mouth 3 (three) times daily as needed for dizziness (only if needed). 30 tablet 0  . Multiple Vitamin (MULTIVITAMIN) tablet Take 2 tablets by mouth daily.     . pantoprazole (PROTONIX) 40 MG tablet Take 1 tablet (40 mg total) by mouth daily. 90 tablet 3  . triamcinolone cream (KENALOG) 0.1 % Apply 1 application topically 2 (two) times daily. 30 g 0  . montelukast (SINGULAIR) 10 MG tablet Take 1 tablet (10 mg total) by mouth at bedtime. (Patient not taking: Reported on 06/28/2019) 30 tablet 3   No current facility-administered medications on file prior to visit.     There are no Patient Instructions on file for this visit. No follow-ups on file.   Kris Hartmann, NP  This note was completed with Sales executive.  Any errors are purely unintentional.

## 2019-07-12 ENCOUNTER — Other Ambulatory Visit: Payer: Self-pay | Admitting: Nurse Practitioner

## 2019-07-12 ENCOUNTER — Ambulatory Visit (INDEPENDENT_AMBULATORY_CARE_PROVIDER_SITE_OTHER): Payer: Medicare Other | Admitting: Pharmacist

## 2019-07-12 DIAGNOSIS — J449 Chronic obstructive pulmonary disease, unspecified: Secondary | ICD-10-CM

## 2019-07-12 DIAGNOSIS — E559 Vitamin D deficiency, unspecified: Secondary | ICD-10-CM

## 2019-07-12 MED ORDER — CHOLECALCIFEROL 1.25 MG (50000 UT) PO TABS: 1.0000 | ORAL_TABLET | ORAL | 0 refills | Status: DC

## 2019-07-12 NOTE — Patient Instructions (Addendum)
It was great talking to you today!  Enclosed is the handout I was referencing. Please let me know if you have any questions! (I'm sorry the font is so small, I still need to make some better copies!)  Catie Darnelle Maffucci, PharmD [phone] Visit Information  Goals Addressed            This Visit's Progress     Patient Stated   . "I have a lot of medications" (pt-stated)       Current Barriers:  . Polypharmacy; complex patient with multiple comorbidities including hx CVA, lymphedema, Vit D deficiency, COPD, prediabetes . Patient notes more "tiredness" lately.  o Has continued on monthly B12 injections to help with mouth burning, last level was appropriate o Has been taking OTC Vitamin D3 1000 IU daily in a multivitamin - reviewed nutritional facts to verify Vitamin D content; last Vit D level subtherapeutic o Notes that she is sleeping "as well as I normally do"; historically, has always slept ~5-6 hours, which is what she is getting now. Wakes up ~4 am most mornings with her husband (he has dialysis TIW), but generally lays back down those mornings from 8-9 am until 11-11:30 am when he gets home. She feels rested. No benefit from melatonin when used in the past.  o Notes that she is trying to lose weight d/t elevated A1c; doing this by cutting out bread  Pharmacist Clinical Goal(s):  Marland Kitchen Over the next 90 days, patient will work with PharmD and provider towards optimized medication management  Interventions: . Comprehensive medication review performed; medication list updated in electronic medical record . Noted that patient is not taking ASA anymore, just clopidogrel. Patient notes that she "bleeds too much" on both. Will confirm that this is appropriate with primary care provider.  . Discussed using weekly Vit D 50,000 for 2-3 months to boost Vit D levels, then switch to daily, as patient has been taking daily D3 1000 units and is still subtherapeutic. Patient amenable to this. Will  collaborate with [name], NP for prescription if appropriate.  . Discussed importance of targeting caloric deficit for weight loss. Patient noted that she is unsure what all foods have carbohydrates; mailing her Healthy Meal Planning handout that lists carbohydrate foods and appropriate serving sizes, as well as description of Plate Method.   Patient Self Care Activities:  . Patient will take medications as prescribed  Please see past updates related to this goal by clicking on the "Past Updates" button in the selected goal       . "My breathing is bad" (pt-stated)       Current Barriers:  . Former smoker, with current COPD; 1 outpatient exacerbation in the past 6 months, high symptom burden, likely COPD B;  o Would like her Symbicort prescription filled for a 3 month supply . No hx PFT on file; PCP plans to order this at next appointment; pending results, will consider switching to LABA/LAMA therapy  . Patient notes she woke up last night with a slight nagging, dry cough  Pharmacist Clinical Goal(s):  Marland Kitchen Over the next 90 days, patient will work with CCM team and primary care provider to address needs related to optimized management of chronic lung disease  Interventions: . Counseled patient on what a PFT is and how it may show differences between pure COPD or COPD w/ some reversibility that may respond to continued ICS therapy. Pending results of PFT at next appointment, will evaluate switching to LABA/LAMA therapy . Reviewed  current allergy regimen. Patient denies any post nasal drip or congestion related to onset of cough. Encouraged continued hydration and cough drops.   Patient Self Care Activities:  . Self administers medications as prescribed . Calls pharmacy for medication refills  Please see past updates related to this goal by clicking on the "Past Updates" button in the selected goal      . "My leg is still not completely healed" (pt-stated)       Current Barriers:   . Polypharmacy; complex patient with multiple comorbidities including chronic venous insufficiency, COPD, CAD, hx CVA, with recent cellulitis requiring multiple rounds of antibiotics.  o Notes they have unwrapped her leg, and it has continued to heal o Using lymphedema pump one leg daily (R leg one day, L leg the next) o Notes that she needs prescription for lymphedema massage therapy once month for massage therapist Michaelle Copas) o Does note a "stinging" quality occasionally in her leg, but hasn't been able to identify any specific pattern of aggravating/remitting factors  Pharmacist Clinical Goal(s):  Marland Kitchen Over the next 90 days, patient will work with PharmD and provider towards optimized medication management regarding chronic venous insufficiency  Interventions: . Will notify [name] of patient's request for massage therapy order as above  Patient Self Care Activities:  . Patient will take medications as prescribed  Please see past updates related to this goal by clicking on the "Past Updates" button in the selected goal         The patient verbalized understanding of instructions provided today and declined a print copy of patient instruction materials.   Plan: - Will outreach patient in the next 5-6 weeks for continued medication management support  Catie Darnelle Maffucci, PharmD Clinical Pharmacist [place] [phone]

## 2019-07-12 NOTE — Chronic Care Management (AMB) (Signed)
Chronic Care Management   Follow Up Note   07/12/2019 Name: Sharon [name] MRN: [id number] DOB: 1946/10/09  Referred by: [name], NP Reason for referral : Chronic Care Management (Medication Management)   Sharon [name] is a 73 y.o. year old female who is a primary care patient of [name], [name], NP. The CCM team was consulted for assistance with chronic disease management and care coordination needs.    Contacted patient telephonically for medication management review today.   Review of patient status, including review of consultants reports, relevant laboratory and other test results, and collaboration with appropriate care team members and the patient's provider was performed as part of comprehensive patient evaluation and provision of chronic care management services.    SDOH (Social Determinants of Health) screening performed today: Stress. See Care Plan for related entries.   Advanced Directives Status: N See Care Plan and [facility] application for related entries.  Outpatient Encounter Medications as of 07/12/2019  Medication Sig Note   albuterol (PROAIR HFA) 108 (90 Base) MCG/ACT inhaler Inhale 2 puffs into the lungs every 6 (six) hours as needed for wheezing or shortness of breath. 03/16/2019: QID during this acute illness, but generally 1-2x/day   atorvastatin (LIPITOR) 40 MG tablet TAKE 1 TABLET BY MOUTH ONCE A DAY AT 6 PM    budesonide-formoterol (SYMBICORT) 160-4.5 MCG/ACT inhaler USE 2 PUFFS BY MOUTH TWICE DAILY.    cholecalciferol (VITAMIN D3) 25 MCG (1000 UT) tablet Take 1,000 Units by mouth daily.    citalopram (CELEXA) 20 MG tablet Take 1 tablet (20 mg total) by mouth daily. 05/27/2019: As needed   clopidogrel (PLAVIX) 75 MG tablet TAKE 1 TABLET(75 MG) BY MOUTH DAILY    Cyanocobalamin 1000 MCG/ML KIT Inject 1,000 mcg as directed every 30 (thirty) days.    estradiol (VIVELLE-DOT) 0.1 MG/24HR patch APPLY 1 PATCH ONTO THE SKIN 2 TIMES A WEEK    Loratadine  (CLARITIN PO) Take by mouth daily.    meclizine (ANTIVERT) 12.5 MG tablet Take 1 tablet (12.5 mg total) by mouth 3 (three) times daily as needed for dizziness (only if needed).    montelukast (SINGULAIR) 10 MG tablet Take 1 tablet (10 mg total) by mouth at bedtime.    Multiple Vitamin (MULTIVITAMIN) tablet Take 2 tablets by mouth daily.     pantoprazole (PROTONIX) 40 MG tablet Take 1 tablet (40 mg total) by mouth daily.    triamcinolone cream (KENALOG) 0.1 % Apply 1 application topically 2 (two) times daily.    aspirin 81 MG chewable tablet Chew by mouth daily.    No facility-administered encounter medications on file as of 07/12/2019.      Goals Addressed            This Visit's Progress     Patient Stated    "I have a lot of medications" (pt-stated)       Current Barriers:   Polypharmacy; complex patient with multiple comorbidities including hx CVA, lymphedema, Vit D deficiency, COPD, prediabetes  Patient notes more "tiredness" lately.  o Has continued on monthly B12 injections to help with mouth burning, last level was appropriate o Has been taking OTC Vitamin D3 1000 IU daily in a multivitamin - reviewed nutritional facts to verify Vitamin D content; last Vit D level subtherapeutic o Notes that she is sleeping "as well as I normally do"; historically, has always slept ~5-6 hours, which is what she is getting now. Wakes up ~4 am most mornings with her husband (he  has dialysis TIW), but generally lays back down those mornings from 8-9 am until 11-11:30 am when he gets home. She feels rested. No benefit from melatonin when used in the past.  o Notes that she is trying to lose weight d/t elevated A1c; doing this by cutting out bread  Pharmacist Clinical Goal(s):   Over the next 90 days, patient will work with PharmD and provider towards optimized medication management  Interventions:  Comprehensive medication review performed; medication list updated in electronic medical  record  Noted that patient is not taking ASA anymore, just clopidogrel. Patient notes that she "bleeds too much" on both. Will confirm that this is appropriate with primary care provider.   Discussed using weekly Vit D 50,000 for 2-3 months to boost Vit D levels, then switch to daily, as patient has been taking daily D3 1000 units and is still subtherapeutic. Patient amenable to this. Will collaborate with [name], NP for prescription if appropriate.   Discussed importance of targeting caloric deficit for weight loss. Patient noted that she is unsure what all foods have carbohydrates; mailing her Healthy Meal Planning handout that lists carbohydrate foods and appropriate serving sizes, as well as description of Plate Method.   Patient Self Care Activities:   Patient will take medications as prescribed  Please see past updates related to this goal by clicking on the "Past Updates" button in the selected goal        "My breathing is bad" (pt-stated)       Current Barriers:   Former smoker, with current COPD; 1 outpatient exacerbation in the past 6 months, high symptom burden, likely COPD B;  o Would like her Symbicort prescription filled for a 3 month supply  No hx PFT on file; PCP plans to order this at next appointment; pending results, will consider switching to LABA/LAMA therapy   Patient notes she woke up last night with a slight nagging, dry cough  Pharmacist Clinical Goal(s):   Over the next 90 days, patient will work with CCM team and primary care provider to address needs related to optimized management of chronic lung disease  Interventions:  Counseled patient on what a PFT is and how it may show differences between pure COPD or COPD w/ some reversibility that may respond to continued ICS therapy. Pending results of PFT at next appointment, will evaluate switching to LABA/LAMA therapy  Reviewed current allergy regimen. Patient denies any post nasal drip or congestion  related to onset of cough. Encouraged continued hydration and cough drops.   Patient Self Care Activities:   Self administers medications as prescribed  Calls pharmacy for medication refills  Please see past updates related to this goal by clicking on the "Past Updates" button in the selected goal       "My leg is still not completely healed" (pt-stated)       Current Barriers:   Polypharmacy; complex patient with multiple comorbidities including chronic venous insufficiency, COPD, CAD, hx CVA, with recent cellulitis requiring multiple rounds of antibiotics.  o Notes they have unwrapped her leg, and it has continued to heal o Using lymphedema pump one leg daily (R leg one day, L leg the next) o Notes that she needs prescription for lymphedema massage therapy once month for massage therapist Michaelle Copas) o Does note a "stinging" quality occasionally in her leg, but hasn't been able to identify any specific pattern of aggravating/remitting factors  Pharmacist Clinical Goal(s):   Over the next 90 days, patient will  work with PharmD and provider towards optimized medication management regarding chronic venous insufficiency  Interventions:  Will notify [name] of patient's request for massage therapy order as above  Patient Self Care Activities:   Patient will take medications as prescribed  Please see past updates related to this goal by clicking on the "Past Updates" button in the selected goal          Plan: - Will outreach patient in the next 5-6 weeks for continued medication management support  Catie Darnelle Maffucci, PharmD Clinical Pharmacist [place] [phone]

## 2019-07-13 ENCOUNTER — Other Ambulatory Visit: Payer: Self-pay

## 2019-07-13 ENCOUNTER — Emergency Department
Admission: EM | Admit: 2019-07-13 | Discharge: 2019-07-13 | Disposition: A | Discharge status: Home / Self Care | Payer: Medicare Other | Attending: Emergency Medicine | Admitting: Emergency Medicine

## 2019-07-13 ENCOUNTER — Encounter: Payer: Self-pay | Admitting: Emergency Medicine

## 2019-07-13 ENCOUNTER — Emergency Department: Payer: Medicare Other

## 2019-07-13 DIAGNOSIS — M810 Age-related osteoporosis without current pathological fracture: Secondary | ICD-10-CM | POA: Insufficient documentation

## 2019-07-13 DIAGNOSIS — M7989 Other specified soft tissue disorders: Secondary | ICD-10-CM | POA: Diagnosis not present

## 2019-07-13 DIAGNOSIS — J449 Chronic obstructive pulmonary disease, unspecified: Secondary | ICD-10-CM | POA: Diagnosis not present

## 2019-07-13 DIAGNOSIS — Z87891 Personal history of nicotine dependence: Secondary | ICD-10-CM | POA: Insufficient documentation

## 2019-07-13 DIAGNOSIS — M79642 Pain in left hand: Secondary | ICD-10-CM | POA: Diagnosis present

## 2019-07-13 DIAGNOSIS — N183 Chronic kidney disease, stage 3 (moderate): Secondary | ICD-10-CM | POA: Insufficient documentation

## 2019-07-13 DIAGNOSIS — Z7982 Long term (current) use of aspirin: Secondary | ICD-10-CM | POA: Diagnosis not present

## 2019-07-13 DIAGNOSIS — Z79899 Other long term (current) drug therapy: Secondary | ICD-10-CM | POA: Diagnosis not present

## 2019-07-13 DIAGNOSIS — R52 Pain, unspecified: Secondary | ICD-10-CM

## 2019-07-13 DIAGNOSIS — M66242 Spontaneous rupture of extensor tendons, left hand: Secondary | ICD-10-CM | POA: Diagnosis not present

## 2019-07-13 DIAGNOSIS — I2510 Atherosclerotic heart disease of native coronary artery without angina pectoris: Secondary | ICD-10-CM | POA: Insufficient documentation

## 2019-07-13 NOTE — ED Triage Notes (Signed)
Pt in via POV, reports injury to left hand, bruising noted to middle knuckle.  Pt unable to recall hitting hand on anything, reports waking up from a nap, noticing pain/bruising.  Ambulatory to triage, NAD noted at this time.

## 2019-07-13 NOTE — Discharge Instructions (Addendum)
Your x-ray is negative for a dislocation or fracture.  I suspect that you have injured the extensor tendon over your knuckle.  Please wear finger splint.  Ice and elevate hand today.  Follow-up with hand surgery or orthopedics.

## 2019-07-13 NOTE — ED Provider Notes (Signed)
[facility] Emergency Department Provider Note  ____________________________________________  Time seen: Approximately 4:13 PM  I have reviewed the triage vital signs and the nursing notes.   HISTORY  Chief Complaint Hand Injury    HPI Sharon [name] is a 73 y.o. female that presents to the emergency department for evaluation of right hand pain over her middle knuckle today.  Patient states that she woke up with swelling and bruising over the knuckle.  She states that it feels kind of like it is dislocated.   She is able to bend and straighten her finger but when she tries she feels a "catch" and needs to use her other hand to fully straighten or bend.  She has not had any trauma.   Past Medical History:  Diagnosis Date  . Anxiety   . Arthritis    hands, upper back  . Asthma   . COPD (chronic obstructive pulmonary disease) ([place])   . History of cervical cancer   . Menopausal disorder   . Osteoporosis   . Pneumonia 1960  . Spasm of abdominal muscles of right side    intermittent  . TMJ (dislocation of temporomandibular joint)   . Wears dentures    partial lower    Patient Active Problem List   Diagnosis Date Noted  . Vitamin D deficiency 06/28/2019  . Prediabetes 06/28/2019  . GERD (gastroesophageal reflux disease) 12/07/2018  . Vitamin B12 deficiency 12/05/2018  . Senile purpura ([place]) 07/28/2018  . Chronic venous insufficiency 07/14/2018  . Lymphedema 07/14/2018  . Aneurysm of descending thoracic aorta (HCC) 05/04/2018  . Allergic rhinitis 02/16/2018  . Aortic atherosclerosis ([place]) 01/13/2018  . Status post placement of implantable loop recorder 01/12/2018  . Chronic kidney disease, stage 3 ([place]) 01/12/2018  . Cerebrovascular accident (CVA) due to embolism of precerebral artery ([place])   . TIA (transient ischemic attack) 01/04/2018  . Osteoarthritis of wrist 12/09/2017  . CAD (coronary artery disease) 11/25/2017  . Caregiver stress 11/25/2017   . Menopause 08/14/2017  . De Quervain's tenosynovitis, right 07/17/2017  . Stress incontinence 06/19/2017  . Personal history of tobacco use, presenting hazards to health 04/01/2017  . Anxiety 03/13/2017  . Glossitis 11/14/2016  . Advanced care planning/counseling discussion 10/17/2016  . Benign neoplasm of sigmoid colon   . COPD (chronic obstructive pulmonary disease) ([place]) 10/17/2015  . Mass of upper inner quadrant of right breast 06/22/2015    Past Surgical History:  Procedure Laterality Date  . ABDOMINAL HYSTERECTOMY  1970's  . bladder botox  2005  . BLADDER SUSPENSION  2004  . CATARACT EXTRACTION W/PHACO Right 03/09/2018   Procedure: CATARACT EXTRACTION PHACO AND INTRAOCULAR LENS PLACEMENT ([place]) right;  Surgeon: Eulogio Bear, MD;  Location: [place];  Service: Ophthalmology;  Laterality: Right;  CALL CELL 1ST  . CATARACT EXTRACTION W/PHACO Left 04/19/2018   Procedure: CATARACT EXTRACTION PHACO AND INTRAOCULAR LENS PLACEMENT (IOC)  LEFT;  Surgeon: Eulogio Bear, MD;  Location: [place];  Service: Ophthalmology;  Laterality: Left;  . COLONOSCOPY WITH PROPOFOL N/A 12/13/2015   Procedure: COLONOSCOPY WITH PROPOFOL;  Surgeon: Lucilla Lame, MD;  Location: [place];  Service: Endoscopy;  Laterality: N/A;  . LOOP RECORDER INSERTION N/A 01/07/2018   Procedure: LOOP RECORDER INSERTION;  Surgeon: Deboraha Sprang, MD;  Location: [place] [facility];  Service: Cardiovascular;  Laterality: N/A;  . POLYPECTOMY N/A 12/13/2015   Procedure: POLYPECTOMY;  Surgeon: Lucilla Lame, MD;  Location: [place];  Service: Endoscopy;  Laterality: N/A;  SIGMOID COLON POLYPS X  5  . SHOULDER ARTHROSCOPY W/ ROTATOR CUFF REPAIR Right 1998  . TEE WITHOUT CARDIOVERSION N/A 01/06/2018   Procedure: TRANSESOPHAGEAL ECHOCARDIOGRAM (TEE);  Surgeon: Minna Merritts, MD;  Location: [facility];  Service: Cardiovascular;  Laterality: N/A;  . TONSILLECTOMY AND ADENOIDECTOMY       Prior to Admission medications   Medication Sig Start Date End Date Taking? Authorizing Provider  albuterol (PROAIR HFA) 108 (90 Base) MCG/ACT inhaler Inhale 2 puffs into the lungs every 6 (six) hours as needed for wheezing or shortness of breath. 09/29/18   [name] T, NP  aspirin 81 MG chewable tablet Chew by mouth daily.    [provider]  atorvastatin (LIPITOR) 40 MG tablet TAKE 1 TABLET BY MOUTH ONCE A DAY AT 6 PM 04/25/19   [name], Jolene T, NP  budesonide-formoterol (SYMBICORT) 160-4.5 MCG/ACT inhaler USE 2 PUFFS BY MOUTH TWICE DAILY. 06/29/19   [name], Henrine Screws T, NP  Cholecalciferol 1.25 MG (50000 UT) TABS Take 1 tablet by mouth once a week. For 8 weeks and then stop.  Return to office for lab draw. 07/12/19   [name], Henrine Screws T, NP  citalopram (CELEXA) 20 MG tablet Take 1 tablet (20 mg total) by mouth daily. 05/09/19   [name], Henrine Screws T, NP  clopidogrel (PLAVIX) 75 MG tablet TAKE 1 TABLET(75 MG) BY MOUTH DAILY 04/01/19   [name], Jolene T, NP  Cyanocobalamin 1000 MCG/ML KIT Inject 1,000 mcg as directed every 30 (thirty) days.    [provider]  estradiol (VIVELLE-DOT) 0.1 MG/24HR patch APPLY 1 PATCH ONTO THE SKIN 2 TIMES A WEEK 05/09/19   [name], Jolene T, NP  Loratadine (CLARITIN PO) Take by mouth daily.    [provider]  meclizine (ANTIVERT) 12.5 MG tablet Take 1 tablet (12.5 mg total) by mouth 3 (three) times daily as needed for dizziness (only if needed). 02/17/19   [name], Jolene T, NP  montelukast (SINGULAIR) 10 MG tablet Take 1 tablet (10 mg total) by mouth at bedtime. 03/11/19   [name] T, NP  Multiple Vitamin (MULTIVITAMIN) tablet Take 2 tablets by mouth daily.     [provider]  pantoprazole (PROTONIX) 40 MG tablet Take 1 tablet (40 mg total) by mouth daily. 09/29/18   [name], Henrine Screws T, NP  triamcinolone cream (KENALOG) 0.1 % Apply 1 application topically 2 (two) times daily. 05/27/19   [name] T, NP     Allergies Baclofen, Bactrim [sulfamethoxazole-trimethoprim], Ibuprofen, Latex, Librium [chlordiazepoxide], Naprosyn [naproxen], and Other  Family History  Problem Relation Age of Onset  . Diabetes Mother   . Heart disease Mother   . Stroke Mother   . Stroke Maternal Grandmother     Social History Social History   Tobacco Use  . Smoking status: Former Smoker    Years: 56.00    Types: Cigarettes    Quit date: 07/14/2016    Years since quitting: 2.9  . Smokeless tobacco: Never Used  Substance Use Topics  . Alcohol use: Yes    Alcohol/week: 0.0 standard drinks  . Drug use: No     Review of Systems  Gastrointestinal: No nausea, no vomiting.  Musculoskeletal: Positive for knuckle pain. Skin: Negative for rash, abrasions, lacerations. Positive for ecchymosis. Neurological: Negative for headaches, numbness or tingling   ____________________________________________   PHYSICAL EXAM:  VITAL SIGNS: ED Triage Vitals  Enc Vitals Group     BP 07/13/19 1455 114/77     Pulse Rate 07/13/19 1455  98     Resp 07/13/19 1455 16     Temp 07/13/19 1455 98.5 F (36.9 C)     Temp src --      SpO2 07/13/19 1455 98 %     Weight 07/13/19 1456 224 lb (101.6 kg)     Height 07/13/19 1456 5' 9"  (1.753 m)     Head Circumference --      Peak Flow --      Pain Score 07/13/19 1455 6     Pain Loc --      Pain Edu? --      Excl. in [place]? --      Constitutional: Alert and oriented. Well appearing and in no acute distress. Eyes: Conjunctivae are normal. PERRL. EOMI. Head: Atraumatic. ENT:      Ears:      Nose: No congestion/rhinnorhea.      Mouth/Throat: Mucous membranes are moist.  Neck: No stridor.  Cardiovascular: Normal rate, regular rhythm.  Good peripheral circulation. Respiratory: Normal respiratory effort without tachypnea or retractions. Lungs CTAB. Good air entry to the bases with no decreased or absent breath sounds. Musculoskeletal: Full range of motion to all extremities.  No gross deformities appreciated.  Limited range of motion of left middle finger at the MCP joint due to pain. Full passive ROM of finger.  Swelling and ecchymosis over middle knuckle. Neurologic:  Normal speech and language. No gross focal neurologic deficits are appreciated.  Skin:  Skin is warm, dry and intact. No rash noted. Psychiatric: Mood and affect are normal. Speech and behavior are normal. Patient exhibits appropriate insight and judgement.   ____________________________________________   LABS (all labs ordered are listed, but only abnormal results are displayed)  Labs Reviewed - No data to display ____________________________________________  EKG   ____________________________________________  Robinette Haines, personally viewed and evaluated these images (plain radiographs) as part of my medical decision making, as well as reviewing the written report by the radiologist.  Dg Hand Complete Left  Result Date: 07/13/2019 CLINICAL DATA:  73 year old female with swelling and erythema at the third MCP joint of the left hand. EXAM: LEFT HAND - COMPLETE 3+ VIEW COMPARISON:  None. FINDINGS: There is no acute fracture or dislocation. The bones are osteopenic. No significant arthritic changes. Mild soft tissue swelling over the dorsum of the third MCP joint. No radiopaque foreign object or soft tissue gas. IMPRESSION: 1. No acute fracture or dislocation. 2. Mild soft tissue swelling over the dorsum of the third MCP joint. No soft tissue gas. Electronically Signed   By: Anner Crete M.D.   On: 07/13/2019 15:45    ____________________________________________    PROCEDURES  Procedure(s) performed:    Procedures    Medications - No data to display   ____________________________________________   INITIAL IMPRESSION / ASSESSMENT AND PLAN / ED COURSE  Pertinent labs & imaging results that were available during my care of the patient were reviewed by me and considered in  my medical decision making (see chart for details).  Review of the [place] CSRS was performed in accordance of the [place] prior to dispensing any controlled drugs.     Patient presenting emergency department for evaluation of swelling and pain to middle knuckle today.  Vital signs and exam are reassuring.  X-ray negative for acute bony abnormality.  I suspect a sagittal band injury.  Finger was splinted. Patient is to follow up with hand surgery or Ortho as directed. Patient is given ED precautions to return to the  ED for any worsening or new symptoms.  AALEYAH WITHEROW was evaluated in Emergency Department on 07/13/2019 for the symptoms described in the history of present illness. She was evaluated in the context of the global COVID-19 pandemic, which necessitated consideration that the patient might be at risk for infection with the SARS-CoV-2 virus that causes COVID-19. Institutional protocols and algorithms that pertain to the evaluation of patients at risk for COVID-19 are in a state of rapid change based on information released by regulatory bodies including the CDC and federal and state organizations. These policies and algorithms were followed during the patient's care in the ED.   ____________________________________________  FINAL CLINICAL IMPRESSION(S) / ED DIAGNOSES  Final diagnoses:  Pain of left hand  Sagittal band rupture, extensor tendon, nontraumatic, left      NEW MEDICATIONS STARTED DURING THIS VISIT:  ED Discharge Orders    None          This chart was dictated using voice recognition software/Dragon. Despite best efforts to proofread, errors can occur which can change the meaning. Any change was purely unintentional.    Laban Emperor, PA-C 07/13/19 2142    Blake Divine, MD 07/14/19 [phone]

## 2019-07-18 ENCOUNTER — Other Ambulatory Visit: Payer: Self-pay | Admitting: Nurse Practitioner

## 2019-07-18 DIAGNOSIS — I872 Venous insufficiency (chronic) (peripheral): Secondary | ICD-10-CM

## 2019-07-18 NOTE — Progress Notes (Signed)
PT for massage therapy

## 2019-07-19 ENCOUNTER — Telehealth: Payer: Self-pay

## 2019-07-19 DIAGNOSIS — S66812D Strain of other specified muscles, fascia and tendons at wrist and hand level, left hand, subsequent encounter: Secondary | ICD-10-CM | POA: Diagnosis not present

## 2019-07-19 DIAGNOSIS — S66303A Unspecified injury of extensor muscle, fascia and tendon of left middle finger at wrist and hand level, initial encounter: Secondary | ICD-10-CM | POA: Diagnosis not present

## 2019-07-20 ENCOUNTER — Ambulatory Visit (INDEPENDENT_AMBULATORY_CARE_PROVIDER_SITE_OTHER): Payer: Medicare Other | Admitting: *Deleted

## 2019-07-20 DIAGNOSIS — I6310 Cerebral infarction due to embolism of unspecified precerebral artery: Secondary | ICD-10-CM | POA: Diagnosis not present

## 2019-07-21 ENCOUNTER — Ambulatory Visit (INDEPENDENT_AMBULATORY_CARE_PROVIDER_SITE_OTHER): Payer: Medicare Other

## 2019-07-21 ENCOUNTER — Other Ambulatory Visit: Payer: Self-pay

## 2019-07-21 DIAGNOSIS — K140 Glossitis: Secondary | ICD-10-CM

## 2019-07-21 DIAGNOSIS — Z23 Encounter for immunization: Secondary | ICD-10-CM

## 2019-07-21 LAB — CUP PACEART REMOTE DEVICE CHECK
Date Time Interrogation Session: 20201006184326
Implantable Pulse Generator Implant Date: 20190328

## 2019-07-21 MED ORDER — CYANOCOBALAMIN 1000 MCG/ML IJ SOLN
1000.0000 ug | Freq: Once | INTRAMUSCULAR | Status: AC
  Administered 2019-07-21: 1000 ug via INTRAMUSCULAR

## 2019-07-26 ENCOUNTER — Telehealth: Payer: Self-pay | Admitting: *Deleted

## 2019-07-26 DIAGNOSIS — Z87891 Personal history of nicotine dependence: Secondary | ICD-10-CM

## 2019-07-26 DIAGNOSIS — Z122 Encounter for screening for malignant neoplasm of respiratory organs: Secondary | ICD-10-CM

## 2019-07-26 DIAGNOSIS — R918 Other nonspecific abnormal finding of lung field: Secondary | ICD-10-CM

## 2019-07-26 NOTE — Telephone Encounter (Signed)
Contacted and scheduled for [facility] follow up 

## 2019-07-27 ENCOUNTER — Telehealth: Payer: Self-pay

## 2019-07-28 ENCOUNTER — Ambulatory Visit
Admission: RE | Admit: 2019-07-28 | Discharge: 2019-07-28 | Disposition: A | Discharge status: Home / Self Care | Payer: Medicare Other | Source: Ambulatory Visit | Attending: Oncology | Admitting: Oncology

## 2019-07-28 ENCOUNTER — Other Ambulatory Visit: Payer: Self-pay

## 2019-07-28 DIAGNOSIS — Z122 Encounter for screening for malignant neoplasm of respiratory organs: Secondary | ICD-10-CM | POA: Diagnosis not present

## 2019-07-28 DIAGNOSIS — J439 Emphysema, unspecified: Secondary | ICD-10-CM | POA: Diagnosis not present

## 2019-07-28 DIAGNOSIS — Z87891 Personal history of nicotine dependence: Secondary | ICD-10-CM | POA: Insufficient documentation

## 2019-07-28 DIAGNOSIS — R911 Solitary pulmonary nodule: Secondary | ICD-10-CM | POA: Diagnosis not present

## 2019-07-28 DIAGNOSIS — R918 Other nonspecific abnormal finding of lung field: Secondary | ICD-10-CM | POA: Diagnosis not present

## 2019-07-29 NOTE — Progress Notes (Signed)
Carelink Summary Report / Loop Recorder 

## 2019-08-01 ENCOUNTER — Encounter: Payer: Self-pay | Admitting: *Deleted

## 2019-08-02 ENCOUNTER — Ambulatory Visit: Payer: Self-pay | Admitting: *Deleted

## 2019-08-02 ENCOUNTER — Telehealth: Payer: Self-pay

## 2019-08-02 DIAGNOSIS — J449 Chronic obstructive pulmonary disease, unspecified: Secondary | ICD-10-CM

## 2019-08-02 NOTE — Chronic Care Management (AMB) (Signed)
  Chronic Care Management   Outreach Note  08/02/2019 Name: Sharon [name] MRN: [id number] DOB: Apr 11, 1946  Referred by: [name], NP Reason for referral : Chronic Care Management (Unsuccessful Outreach x1 )   An unsuccessful telephone outreach was attempted today. The patient was referred to the case management team by for assistance with care management and care coordination.   Follow Up Plan: A HIPPA compliant phone message was left for the patient providing contact information and requesting a return call.  The care management team will reach out to the patient again over the next 30 days.   Merlene Morse Jeorgia Helming RN, BSN Nurse Case Editor, commissioning Family Practice/[facility] Care Management  [phone]) Business Mobile

## 2019-08-09 ENCOUNTER — Ambulatory Visit: Payer: Self-pay | Admitting: Licensed Clinical Social Worker

## 2019-08-09 ENCOUNTER — Telehealth: Payer: Self-pay

## 2019-08-09 NOTE — Chronic Care Management (AMB) (Signed)
  Care Management   Follow Up Note   08/09/2019 Name: Sharon [name] MRN: [id number] DOB: January 08, 1946  Referred by: [name], NP Reason for referral : [name] is a 73 y.o. year old female who is a primary care patient of [name], [name], NP. The care management team was consulted for assistance with care management and care coordination needs.    Review of patient status, including review of consultants reports, relevant laboratory and other test results, and collaboration with appropriate care team members and the patient's provider was performed as part of comprehensive patient evaluation and provision of chronic care management services.    LCSW completed CCM outreach attempt today but was unable to reach patient successfully. A HIPPA compliant voice message was left encouraging patient to return call once available. LCSW rescheduled CCM SW appointment as well.  A HIPPA compliant phone message was left for the patient providing contact information and requesting a return call.   [name], BSW, MSW, [facility] Care Management [email or web address] Phone: [phone]

## 2019-08-16 DIAGNOSIS — M19039 Primary osteoarthritis, unspecified wrist: Secondary | ICD-10-CM | POA: Diagnosis not present

## 2019-08-16 DIAGNOSIS — S66303A Unspecified injury of extensor muscle, fascia and tendon of left middle finger at wrist and hand level, initial encounter: Secondary | ICD-10-CM | POA: Diagnosis not present

## 2019-08-17 ENCOUNTER — Ambulatory Visit: Payer: Self-pay | Admitting: Pharmacist

## 2019-08-17 ENCOUNTER — Encounter: Payer: Self-pay | Admitting: Pharmacist

## 2019-08-17 NOTE — Chronic Care Management (AMB) (Signed)
  Chronic Care Management   Note  08/17/2019 Name: Sharon [name] MRN: [id number] DOB: [birth year]  Sharon [name] is a 73 y.o. year old female who is a primary care patient of [name], [name], NP. The CCM team was consulted for assistance with chronic disease management and care coordination needs.    Contacted patient, discussed CCM team roles, schedules, and phone numbers. See MyChart message. Will plan to meet with her in 2 weeks at her PCP appointment with [name], NP.   Catie Darnelle Maffucci, PharmD Clinical Pharmacist [place] [phone]

## 2019-08-22 ENCOUNTER — Ambulatory Visit (INDEPENDENT_AMBULATORY_CARE_PROVIDER_SITE_OTHER): Payer: Medicare Other | Admitting: *Deleted

## 2019-08-22 ENCOUNTER — Other Ambulatory Visit: Payer: Self-pay

## 2019-08-22 ENCOUNTER — Other Ambulatory Visit: Payer: Self-pay | Admitting: Nurse Practitioner

## 2019-08-22 DIAGNOSIS — G459 Transient cerebral ischemic attack, unspecified: Secondary | ICD-10-CM

## 2019-08-22 MED ORDER — PANTOPRAZOLE SODIUM 40 MG PO TBEC: 40.0000 mg | DELAYED_RELEASE_TABLET | Freq: Every day | ORAL | 3 refills | Status: DC

## 2019-08-23 ENCOUNTER — Other Ambulatory Visit: Payer: Self-pay

## 2019-08-23 ENCOUNTER — Ambulatory Visit (INDEPENDENT_AMBULATORY_CARE_PROVIDER_SITE_OTHER): Payer: Medicare Other

## 2019-08-23 DIAGNOSIS — E538 Deficiency of other specified B group vitamins: Secondary | ICD-10-CM

## 2019-08-23 DIAGNOSIS — H26493 Other secondary cataract, bilateral: Secondary | ICD-10-CM | POA: Diagnosis not present

## 2019-08-23 LAB — CUP PACEART REMOTE DEVICE CHECK
Date Time Interrogation Session: 20201108183833
Implantable Pulse Generator Implant Date: 20190328

## 2019-08-23 MED ORDER — CYANOCOBALAMIN 1000 MCG/ML IJ SOLN
1000.0000 ug | Freq: Once | INTRAMUSCULAR | Status: AC
  Administered 2019-08-23: 1000 ug via INTRAMUSCULAR

## 2019-08-25 DIAGNOSIS — H26493 Other secondary cataract, bilateral: Secondary | ICD-10-CM | POA: Diagnosis not present

## 2019-08-29 ENCOUNTER — Telehealth: Payer: Self-pay

## 2019-08-30 ENCOUNTER — Other Ambulatory Visit: Payer: Self-pay

## 2019-08-30 ENCOUNTER — Encounter: Payer: Self-pay | Admitting: Nurse Practitioner

## 2019-08-30 ENCOUNTER — Ambulatory Visit: Payer: Self-pay | Admitting: *Deleted

## 2019-08-30 ENCOUNTER — Ambulatory Visit (INDEPENDENT_AMBULATORY_CARE_PROVIDER_SITE_OTHER): Payer: Medicare Other | Admitting: Nurse Practitioner

## 2019-08-30 ENCOUNTER — Telehealth: Payer: Self-pay

## 2019-08-30 VITALS — BP 124/79 | HR 94 | Temp 98.0°F | Ht 68.0 in | Wt 222.0 lb

## 2019-08-30 DIAGNOSIS — J410 Simple chronic bronchitis: Secondary | ICD-10-CM | POA: Diagnosis not present

## 2019-08-30 DIAGNOSIS — I872 Venous insufficiency (chronic) (peripheral): Secondary | ICD-10-CM

## 2019-08-30 DIAGNOSIS — I890 Lymphedema, not elsewhere classified: Secondary | ICD-10-CM

## 2019-08-30 DIAGNOSIS — E538 Deficiency of other specified B group vitamins: Secondary | ICD-10-CM | POA: Diagnosis not present

## 2019-08-30 MED ORDER — CEPHALEXIN 500 MG PO CAPS: 500.0000 mg | ORAL_CAPSULE | Freq: Four times a day (QID) | ORAL | 0 refills | Status: AC

## 2019-08-30 MED ORDER — MONTELUKAST SODIUM 10 MG PO TABS: 10.0000 mg | ORAL_TABLET | Freq: Every day | ORAL | 3 refills | Status: DC

## 2019-08-30 MED ORDER — BENZONATATE 100 MG PO CAPS: 100.0000 mg | ORAL_CAPSULE | Freq: Three times a day (TID) | ORAL | 0 refills | Status: DC | PRN

## 2019-08-30 NOTE — Assessment & Plan Note (Addendum)
Chronic, ongoing.  Continue current medication regimen and adjust as needed.  May benefit from change to [facility] for once daily dosing and LAMA/LABA combo.  Obtain spirometry next visit. Tessalon script sent to use as needed for cough.

## 2019-08-30 NOTE — Chronic Care Management (AMB) (Signed)
Chronic Care Management   Follow Up Note   08/30/2019 Name: Sharon [name] MRN: [id number] DOB: [birth year]  Referred by: [name], NP Reason for referral : No chief complaint on file.   Sharon [name] is a 73 y.o. year old female who is a primary care patient of [name], [name], NP. The CCM team was consulted for assistance with chronic disease management and care coordination needs.    Review of patient status, including review of consultants reports, relevant laboratory and other test results, and collaboration with appropriate care team members and the patient's provider was performed as part of comprehensive patient evaluation and provision of chronic care management services.    SDOH (Social Determinants of Health) screening performed today: Stress. See Care Plan for related entries.   Outpatient Encounter Medications as of 08/30/2019  Medication Sig Note  . albuterol (PROAIR HFA) 108 (90 Base) MCG/ACT inhaler Inhale 2 puffs into the lungs every 6 (six) hours as needed for wheezing or shortness of breath. 03/16/2019: QID during this acute illness, but generally 1-2x/day  . aspirin 81 MG chewable tablet Chew by mouth daily.   Marland Kitchen atorvastatin (LIPITOR) 40 MG tablet TAKE 1 TABLET BY MOUTH ONCE A DAY AT 6 PM   . budesonide-formoterol (SYMBICORT) 160-4.5 MCG/ACT inhaler USE 2 PUFFS BY MOUTH TWICE DAILY.   . citalopram (CELEXA) 20 MG tablet Take 1 tablet (20 mg total) by mouth daily. 05/27/2019: As needed  . clopidogrel (PLAVIX) 75 MG tablet TAKE 1 TABLET(75 MG) BY MOUTH DAILY   . Cyanocobalamin 1000 MCG/ML KIT Inject 1,000 mcg as directed every 30 (thirty) days.   Marland Kitchen estradiol (VIVELLE-DOT) 0.1 MG/24HR patch APPLY 1 PATCH ONTO THE SKIN 2 TIMES A WEEK   . Loratadine (CLARITIN PO) Take by mouth daily.   . meclizine (ANTIVERT) 12.5 MG tablet Take 1 tablet (12.5 mg total) by mouth 3 (three) times daily as needed for dizziness (only if needed).   . montelukast (SINGULAIR) 10 MG tablet  Take 1 tablet (10 mg total) by mouth at bedtime.   . Multiple Vitamin (MULTIVITAMIN) tablet Take 2 tablets by mouth daily.    . pantoprazole (PROTONIX) 40 MG tablet Take 1 tablet (40 mg total) by mouth daily.   Marland Kitchen triamcinolone cream (KENALOG) 0.1 % Apply 1 application topically 2 (two) times daily.    No facility-administered encounter medications on file as of 08/30/2019.      Goals Addressed            This Visit's Progress   . RN- I need to take care of my edema (pt-stated)       Current Barriers:  . Chronic Disease Management support and education needs related to lymph edema  Nurse Case Manager Clinical Goal(s):  Marland Kitchen Over the next 90 days, patient will work with [facility] to address needs related to lymph edema  Interventions:  . Evaluation of current treatment plan related to Bilateral lower extremity edema and patient's adherence to plan as established by provider. . Advised patient to allow herself to go get lymph edema massages as recommended by her vein & vascular specialist. Patient stating she has a lot going on an it is hard to fit it in.  . Discussed plans with patient for ongoing care management follow up and provided patient with direct contact information for care management team . Patient using lymph edema pumps q day for 1 hour as directed, but continues to have edema.   Patient Self Care Activities:  .  Performs ADL's independently . Performs IADL's independently . Does not adhere to provider recommendations re: to obtain lymph edema massages bi-weekly related to time and financial constraints. Patient plans to work on this as a goal.   Initial goal documentation         The care management team will reach out to the patient again over the next 60 days.  The patient has been provided with contact information for the care management team and has been advised to call with any health related questions or concerns.    Merlene Morse Haward Pope RN, BSN Nurse Case Editor, commissioning  Family Practice/[facility] Care Management  [phone]) Business Mobile

## 2019-08-30 NOTE — Assessment & Plan Note (Signed)
Continue monthly injections and recheck B12 level at next visit.

## 2019-08-30 NOTE — Assessment & Plan Note (Signed)
Chronic, ongoing with current mild erythema to right lower leg.  Script sent for Keflex due to concern for initial stages cellulitis returning.  Recommend returning to massage clinic as soon as able to and continued use of compression hose daily.  Continue collaboration with vascular team.  Appreciate their input.  Return in one week for virtual visit.

## 2019-08-30 NOTE — Progress Notes (Signed)
BP 124/79    Pulse 94    Temp 98 F (36.7 C) (Oral)    Ht 5\' 8"  (1.727 m)    Wt 222 lb (100.7 kg)    LMP  (LMP Unknown)    SpO2 97%    BMI 33.75 kg/m    Subjective:    Patient ID: Sharon [name], female    DOB: 02-Mar-1946, 73 y.o.   MRN: [id number]  HPI: Sharon [name] is a 73 y.o. female  Chief Complaint  Patient presents with   COPD   Vitamin B 12 deficiency   COPD Continues on Symbicort daily and Albuterol PRN + daily Singulair and Claritin.   COPD status: stable Satisfied with current treatment?: yes Oxygen use: no Dyspnea frequency: intermittent Cough frequency: intermittent Rescue inhaler frequency:   Limitation of activity: no Productive cough: none Last Spirometry: unknown Pneumovax: Up to Date Influenza: Up to Date   LYMPHEDEMA WITH CHRONIC VENOUS INSUFFICIENCY: Saw vascular last 07/06/2019 and returns in December.  Was treated for cellulitis in August with abx and Prednisone + Unna wraps.  Currently unable to go to her massage therapist which she reports benefit from, does have massage pump at home but does not feel it works as well.  Reports some increased redness to right lower leg over past couple days, but no pain or wounds.  Is concerned it is beginning to appear like it did when cellulitis initially presented in September.  Does endorses difficulty being off feet, due to caring for husband who receives dialysis 3 times a week  VITAMIN B12 DEFICIENCY: Continues on monthly injections.  Last level in September 1,289.  Relevant past medical, surgical, family and social history reviewed and updated as indicated. Interim medical history since our last visit reviewed. Allergies and medications reviewed and updated.  Review of Systems  Constitutional: Negative for activity change, appetite change, diaphoresis, fatigue and fever.  Respiratory: Positive for wheezing (intermittent). Negative for cough, chest tightness and shortness of breath.   Cardiovascular:  Positive for leg swelling. Negative for chest pain and palpitations.  Gastrointestinal: Negative for abdominal distention, abdominal pain, constipation, diarrhea, nausea and vomiting.  Endocrine: Negative for cold intolerance, heat intolerance, polydipsia, polyphagia and polyuria.  Neurological: Negative for dizziness, syncope, weakness, light-headedness, numbness and headaches.  Psychiatric/Behavioral: Negative.     Per HPI unless specifically indicated above     Objective:    BP 124/79    Pulse 94    Temp 98 F (36.7 C) (Oral)    Ht 5\' 8"  (1.727 m)    Wt 222 lb (100.7 kg)    LMP  (LMP Unknown)    SpO2 97%    BMI 33.75 kg/m   Wt Readings from Last 3 Encounters:  08/30/19 222 lb (100.7 kg)  07/13/19 224 lb (101.6 kg)  07/06/19 223 lb (101.2 kg)    Physical Exam Vitals signs and nursing note reviewed.  Constitutional:      General: She is awake. She is not in acute distress.    Appearance: She is well-developed. She is not ill-appearing.  HENT:     Head: Normocephalic.     Right Ear: Hearing normal.     Left Ear: Hearing normal.  Eyes:     General: Lids are normal.        Right eye: No discharge.        Left eye: No discharge.     Conjunctiva/sclera: Conjunctivae normal.  Neck:     Musculoskeletal: Normal range  of motion.     Vascular: No carotid bruit.  Cardiovascular:     Rate and Rhythm: Normal rate.     Heart sounds: Normal heart sounds. No murmur. No systolic murmur. No diastolic murmur.     Comments: Compression hose on, pulled down for exam. Pulmonary:     Effort: Pulmonary effort is normal. No accessory muscle usage or respiratory distress.     Breath sounds: Normal breath sounds.  Musculoskeletal:     Right lower leg: 2+ Edema present.     Left lower leg: 2+ Edema present.  Skin:    Comments: Right lower extremity with mild erythema anterior mid-calf. No pain with movement reported. Edema present BLE.  No warmth or drainage.  Negative Homan and Squeeze  bilaterally.  Neurological:     Mental Status: She is alert and oriented to person, place, and time.  Psychiatric:        Attention and Perception: Attention normal.        Mood and Affect: Mood normal.        Behavior: Behavior normal. Behavior is cooperative.        Thought Content: Thought content normal.        Judgment: Judgment normal.     Results for orders placed or performed in visit on 08/22/19  CUP PACEART REMOTE DEVICE CHECK  Result Value Ref Range   Date Time Interrogation Session [id number]    Pulse Generator Manufacturer MERM    Pulse Gen Model [id number] Reveal LINQ    Pulse Gen Serial Number [id number]    Clinic Name [facility]    Implantable Pulse Generator Type ICM/ILR    Implantable Pulse Generator Implant Date [id number]       Assessment & Plan:   Problem List Items Addressed This Visit      Respiratory   COPD (chronic obstructive pulmonary disease) ([place]) - Primary    Chronic, ongoing.  Continue current medication regimen and adjust as needed.  May benefit from change to [facility] for once daily dosing and LAMA/LABA combo.  Obtain spirometry next visit. Tessalon script sent to use as needed for cough.      Relevant Medications   benzonatate (TESSALON PERLES) 100 MG capsule   montelukast (SINGULAIR) 10 MG tablet     Other   Lymphedema    Chronic, ongoing with current mild erythema to right lower leg.  Script sent for Keflex due to concern for initial stages cellulitis returning.  Recommend returning to massage clinic as soon as able to and continued use of compression hose daily.  Continue collaboration with vascular team.  Appreciate their input.  Return in one week for virtual visit.      Vitamin B12 deficiency    Continue monthly injections and recheck B12 level at next visit.          Follow up plan: Return in about 1 week (around 09/06/2019) for Lymphedema follow-up.

## 2019-08-30 NOTE — Patient Instructions (Addendum)
VITAMIN D3 1000 units daily  Cellulitis, Adult  Cellulitis is a skin infection. The infected area is often warm, red, swollen, and sore. It occurs most often in the arms and lower legs. It is very important to get treated for this condition. What are the causes? This condition is caused by bacteria. The bacteria enter through a break in the skin, such as a cut, burn, insect bite, open sore, or crack. What increases the risk? This condition is more likely to occur in people who:  Have a weak body defense system (immune system).  Have open cuts, burns, bites, or scrapes on the skin.  Are older than 73 years of age.  Have a blood sugar problem (diabetes).  Have a long-lasting (chronic) liver disease (cirrhosis) or kidney disease.  Are very overweight (obese).  Have a skin problem, such as: ? Itchy rash (eczema). ? Slow movement of blood in the veins (venous stasis). ? Fluid buildup below the skin (edema).  Have been treated with high-energy rays (radiation).  Use IV drugs. What are the signs or symptoms? Symptoms of this condition include:  Skin that is: ? Red. ? Streaking. ? Spotting. ? Swollen. ? Sore or painful when you touch it. ? Warm.  A fever.  Chills.  Blisters. How is this diagnosed? This condition is diagnosed based on:  Medical history.  Physical exam.  Blood tests.  Imaging tests. How is this treated? Treatment for this condition may include:  Medicines to treat infections or allergies.  Home care, such as: ? Rest. ? Placing cold or warm cloths (compresses) on the skin.  Hospital care, if the condition is very bad. Follow these instructions at home: Medicines  Take over-the-counter and prescription medicines only as told by your doctor.  If you were prescribed an antibiotic medicine, take it as told by your doctor. Do not stop taking it even if you start to feel better. General instructions   Drink enough fluid to keep your pee  (urine) pale yellow.  Do not touch or rub the infected area.  Raise (elevate) the infected area above the level of your heart while you are sitting or lying down.  Place cold or warm cloths on the area as told by your doctor.  Keep all follow-up visits as told by your doctor. This is important. Contact a doctor if:  You have a fever.  You do not start to get better after 1-2 days of treatment.  Your bone or joint under the infected area starts to hurt after the skin has healed.  Your infection comes back. This can happen in the same area or another area.  You have a swollen bump in the area.  You have new symptoms.  You feel ill and have muscle aches and pains. Get help right away if:  Your symptoms get worse.  You feel very sleepy.  You throw up (vomit) or have watery poop (diarrhea) for a long time.  You see red streaks coming from the area.  Your red area gets larger.  Your red area turns dark in color. These symptoms may represent a serious problem that is an emergency. Do not wait to see if the symptoms will go away. Get medical help right away. Call your local emergency services (911 in the [place] Do not drive yourself to the hospital. Summary  Cellulitis is a skin infection. The area is often warm, red, swollen, and sore.  This condition is treated with medicines, rest, and cold and warm  cloths.  Take all medicines only as told by your doctor.  Tell your doctor if symptoms do not start to get better after 1-2 days of treatment. This information is not intended to replace advice given to you by your health care provider. Make sure you discuss any questions you have with your health care provider. Document Released: 03/17/2008 Document Revised: 02/18/2018 Document Reviewed: 02/18/2018 Elsevier Patient Education  2020 [employer].

## 2019-09-01 DIAGNOSIS — S66812D Strain of other specified muscles, fascia and tendons at wrist and hand level, left hand, subsequent encounter: Secondary | ICD-10-CM | POA: Diagnosis not present

## 2019-09-04 NOTE — Patient Instructions (Signed)
Thank you allowing the Chronic Care Management Team to be a part of your care! It was a pleasure speaking with you today!  CCM (Chronic Care Management) Team   Cniyah Sproull RN, BSN Nurse Care Coordinator  [phone]  [employer] PharmD  Clinical Pharmacist  [phone]  [name] LCSW Clinical Social Worker [phone]  Goals Addressed            This Visit's Progress   . RN- I need to take care of my edema (pt-stated)       Current Barriers:  . Chronic Disease Management support and education needs related to lymph edema  Nurse Case Manager Clinical Goal(s):  Marland Kitchen Over the next 90 days, patient will work with [facility] to address needs related to lymph edema  Interventions:  . Evaluation of current treatment plan related to Bilateral lower extremity edema and patient's adherence to plan as established by provider. . Advised patient to allow herself to go get lymph edema massages as recommended by her vein & vascular specialist. Patient stating she has a lot going on an it is hard to fit it in.  . Discussed plans with patient for ongoing care management follow up and provided patient with direct contact information for care management team . Patient using lymph edema pumps q day for 1 hour as directed, but continues to have edema.   Patient Self Care Activities:  . Performs ADL's independently . Performs IADL's independently . Does not adhere to provider recommendations re: to obtain lymph edema massages bi-weekly related to time and financial constraints. Patient plans to work on this as a goal.   Initial goal documentation        The patient verbalized understanding of instructions provided today and declined a print copy of patient instruction materials.   The patient has been provided with contact information for the care management team and has been advised to call with any health related questions or concerns.

## 2019-09-05 ENCOUNTER — Telehealth: Payer: Self-pay

## 2019-09-06 ENCOUNTER — Ambulatory Visit (INDEPENDENT_AMBULATORY_CARE_PROVIDER_SITE_OTHER): Payer: Medicare Other | Admitting: Nurse Practitioner

## 2019-09-06 ENCOUNTER — Other Ambulatory Visit: Payer: Self-pay

## 2019-09-06 ENCOUNTER — Encounter: Payer: Self-pay | Admitting: Nurse Practitioner

## 2019-09-06 DIAGNOSIS — I890 Lymphedema, not elsewhere classified: Secondary | ICD-10-CM

## 2019-09-06 NOTE — Patient Instructions (Signed)

## 2019-09-06 NOTE — Progress Notes (Signed)
LMP  (LMP Unknown)    Subjective:    Patient ID: Sharon [name], female    DOB: 03-29-1946, 73 y.o.   MRN: [id number]  HPI: Sharon [name] is a 73 y.o. female  Chief Complaint  Patient presents with   Follow-up    1 week f/up      This visit was completed via Doximity due to the restrictions of the COVID-19 pandemic. All issues as above were discussed and addressed. Physical exam was done as above through visual confirmation on Doximity. If it was felt that the patient should be evaluated in the office, they were directed there. The patient verbally consented to this visit.  Location of the patient: home  Location of the provider: work  Those involved with this call:   Provider: [name], DNP  CMA: Yvonna Alanis, [place] Desk/Registration: Jill Side   Time spent on call: 15 minutes with patient face to face via video conference. More than 50% of this time was spent in counseling and coordination of care. 10 minutes total spent in review of patient's record and preparation of their chart.   I verified patient identity using two factors (patient name and date of birth). Patient consents verbally to being seen via telemedicine visit today.    CELLULITIS: Saw vascular last 07/06/2019 and returns in December. Was treated for cellulitis in August with abx and Prednisone + Unna wraps. Currently unable to go to her massage therapist which she reports benefit from, does have massage pump at home but does not feel it works as well.  Was concerned at recent visit that beginning to appear like it did when cellulitis initially presented in September.  Keflex started last visit, reports continued mild redness but no warmth or abrasions, drainage.  Has completed course of Keflex.  Goes for lymphedema visit December 8th and vascular next in December. Duration: days Location: right lower leg History of trauma in area: no Pain: no Quality: improved Severity: improved at  this time, she reports noticing Eucerin causes more redness and plans to try coconut oil Redness: yes Swelling: no Oozing: no Pus: no Fevers: no Nausea/vomiting: no Status: stable Treatments attempted:antibiotics  Relevant past medical, surgical, family and social history reviewed and updated as indicated. Interim medical history since our last visit reviewed. Allergies and medications reviewed and updated.  Review of Systems  Constitutional: Negative for activity change, appetite change, diaphoresis, fatigue and fever.  Respiratory: Negative for cough, chest tightness and shortness of breath.   Cardiovascular: Positive for leg swelling (baseline). Negative for chest pain and palpitations.  Gastrointestinal: Negative for abdominal distention, abdominal pain, constipation, diarrhea, nausea and vomiting.  Neurological: Negative for dizziness, syncope, weakness, light-headedness, numbness and headaches.  Psychiatric/Behavioral: Negative.     Per HPI unless specifically indicated above     Objective:    LMP  (LMP Unknown)   Wt Readings from Last 3 Encounters:  08/30/19 222 lb (100.7 kg)  07/13/19 224 lb (101.6 kg)  07/06/19 223 lb (101.2 kg)    Physical Exam Vitals signs and nursing note reviewed.  Constitutional:      General: She is awake. She is not in acute distress.    Appearance: She is well-developed. She is not ill-appearing.  HENT:     Head: Normocephalic.     Right Ear: Hearing normal.     Left Ear: Hearing normal.  Eyes:     General: Lids are normal.        Right  eye: No discharge.        Left eye: No discharge.     Conjunctiva/sclera: Conjunctivae normal.  Neck:     Musculoskeletal: Normal range of motion.  Cardiovascular:     Comments: Via visual appears baseline edema bilaterally.   Pulmonary:     Effort: Pulmonary effort is normal. No accessory muscle usage or respiratory distress.  Skin:    Comments: Right lower extremity with mild erythema noted,  similar to LLE.  No pain with movement reported. No warmth or drainage reported by patient on self exam.  Neurological:     Mental Status: She is alert and oriented to person, place, and time.  Psychiatric:        Attention and Perception: Attention normal.        Mood and Affect: Mood normal.        Behavior: Behavior normal. Behavior is cooperative.        Thought Content: Thought content normal.        Judgment: Judgment normal.     Results for orders placed or performed in visit on 08/22/19  CUP PACEART REMOTE DEVICE CHECK  Result Value Ref Range   Date Time Interrogation Session [id number]    Pulse Generator Manufacturer MERM    Pulse Gen Model [id number] Reveal LINQ    Pulse Gen Serial Number [id number]    Clinic Name [facility]    Implantable Pulse Generator Type ICM/ILR    Implantable Pulse Generator Implant Date [id number]       Assessment & Plan:   Problem List Items Addressed This Visit      Other   Lymphedema    Chronic, ongoing with current mild erythema to right lower leg, no further warmth.  Not always adherent to plan of care, does not often elevated legs.  Recommend returning to massage clinic as soon as able to and continued use of compression hose daily + scheduling to see vascular sooner to ensure no changes need from their aspect.  Continue collaboration with vascular team.  Appreciate their input.  Return in 4 weeks or sooner if worsening.         I discussed the assessment and treatment plan with the patient. The patient was provided an opportunity to ask questions and all were answered. The patient agreed with the plan and demonstrated an understanding of the instructions.   The patient was advised to call back or seek an in-person evaluation if the symptoms worsen or if the condition fails to improve as anticipated.   I provided 15 minutes of time during this encounter.  Follow up plan: Return in about 4 weeks (around 10/04/2019) for Lymphedema.

## 2019-09-06 NOTE — Assessment & Plan Note (Signed)
Chronic, ongoing with current mild erythema to right lower leg, no further warmth.  Not always adherent to plan of care, does not often elevated legs.  Recommend returning to massage clinic as soon as able to and continued use of compression hose daily + scheduling to see vascular sooner to ensure no changes need from their aspect.  Continue collaboration with vascular team.  Appreciate their input.  Return in 4 weeks or sooner if worsening.

## 2019-09-15 ENCOUNTER — Encounter

## 2019-09-15 DIAGNOSIS — S66812D Strain of other specified muscles, fascia and tendons at wrist and hand level, left hand, subsequent encounter: Secondary | ICD-10-CM | POA: Diagnosis not present

## 2019-09-20 ENCOUNTER — Other Ambulatory Visit: Payer: Self-pay

## 2019-09-20 ENCOUNTER — Encounter: Payer: Self-pay | Admitting: Occupational Therapy

## 2019-09-20 ENCOUNTER — Ambulatory Visit: Payer: Medicare Other | Attending: Nurse Practitioner | Admitting: Occupational Therapy

## 2019-09-20 DIAGNOSIS — I890 Lymphedema, not elsewhere classified: Secondary | ICD-10-CM | POA: Insufficient documentation

## 2019-09-20 NOTE — Patient Instructions (Signed)

## 2019-09-20 NOTE — Progress Notes (Signed)
Carelink Summary Report / Loop Recorder 

## 2019-09-20 NOTE — Therapy (Signed)
[place] MAIN [facility] SERVICES [address] [place], [name], [address] Phone: [phone]   Fax:  [phone]  Occupational Therapy Evaluation  Patient Details  Name: Sharon [name] MRN: [id number] Date of Birth: [birth year] Referring Provider (OT): [name], NP   Encounter Date: 09/20/2019  OT End of Session - 09/20/19 1615    Visit Number  1    Number of Visits  36    Date for OT Re-Evaluation  12/19/19    Authorization Time Period  09/20/19 ( initial eval) - 12/19/2019    Authorization - Visit Number  1    Authorization - Number of Visits  36    OT Start Time  0900    OT Stop Time  0950    OT Time Calculation (min)  50 min    Activity Tolerance  Patient tolerated treatment well;No increased pain    Behavior During Therapy  WFL for tasks assessed/performed       Past Medical History:  Diagnosis Date  . Anxiety   . Arthritis    hands, upper back  . Asthma   . COPD (chronic obstructive pulmonary disease) ([place])   . History of cervical cancer   . Menopausal disorder   . Osteoporosis   . Pneumonia 1960  . Spasm of abdominal muscles of right side    intermittent  . TMJ (dislocation of temporomandibular joint)   . Wears dentures    partial lower    Past Surgical History:  Procedure Laterality Date  . ABDOMINAL HYSTERECTOMY  1970's  . bladder botox  2005  . BLADDER SUSPENSION  2004  . CATARACT EXTRACTION W/PHACO Right 03/09/2018   Procedure: CATARACT EXTRACTION PHACO AND INTRAOCULAR LENS PLACEMENT ([place]) right;  Surgeon: Eulogio Bear, MD;  Location: [place];  Service: Ophthalmology;  Laterality: Right;  CALL CELL 1ST  . CATARACT EXTRACTION W/PHACO Left 04/19/2018   Procedure: CATARACT EXTRACTION PHACO AND INTRAOCULAR LENS PLACEMENT ([facility])  LEFT;  Surgeon: Eulogio Bear, MD;  Location: [place];  Service: Ophthalmology;  Laterality: Left;  . COLONOSCOPY WITH PROPOFOL N/A 12/13/2015   Procedure:  COLONOSCOPY WITH PROPOFOL;  Surgeon: Lucilla Lame, MD;  Location: [place];  Service: Endoscopy;  Laterality: N/A;  . LOOP RECORDER INSERTION N/A 01/07/2018   Procedure: LOOP RECORDER INSERTION;  Surgeon: Deboraha Sprang, MD;  Location: [place] [facility];  Service: Cardiovascular;  Laterality: N/A;  . POLYPECTOMY N/A 12/13/2015   Procedure: POLYPECTOMY;  Surgeon: Lucilla Lame, MD;  Location: [place];  Service: Endoscopy;  Laterality: N/A;  SIGMOID COLON POLYPS X  5  . SHOULDER ARTHROSCOPY W/ ROTATOR CUFF REPAIR Right 1998  . TEE WITHOUT CARDIOVERSION N/A 01/06/2018   Procedure: TRANSESOPHAGEAL ECHOCARDIOGRAM (TEE);  Surgeon: Minna Merritts, MD;  Location: [facility];  Service: Cardiovascular;  Laterality: N/A;  . TONSILLECTOMY AND ADENOIDECTOMY      There were no vitals filed for this visit.  Subjective Assessment - 09/20/19 1016    Subjective   Mrs Thigpen returns to Occupational Therapy with a new referral for lymphedema evaluation and treatment with new referral from [name], NP, of [facility]. Mrs Urban successfully underwent Complete Decongestive Therapy (CDT) for Intensive Phase lymphedema care in 2019-20 attending 18 OT visits. She was last seen in March 2020 when her  transition to Management Phase CDT was interupted by Covid-19's [facility]  shutdown. Pt presents wearing  ccl 1, off-the-shelf,  compression knee highs  bilaterally. The brand is unknown, but these ae not compression garments perscribed during prior treatment. Pt admits she does not always don garments first thing in the morning due to caregiver demands. Pt reports she uses her "pump" ([employer], basic sequential pneumatic compression device, "Entre'"). but not as frequently as recommended. Pt reports an episode of RLE cellulitis treated with oral antibiotics in September 2020. Dopler study from that episode is negative for DVT and CVI. Pt reports leg swelling has been  exacerbated since that time.Pt's goals are to reduce leg swelling, reduce assosicated pain and limit recurrent infection risk.        [facility] OT Assessment - 09/20/19 0001      Assessment   Medical Diagnosis  Moderate, stage II BLE Lymphedea 2/2 multiple orthopedic traumas, hx wounds and recurrent cellulitis, obesity, and long term overload 2/2 contributing CHF and CKD    Referring Provider (OT)  [name], NP    Hand Dominance  Right    Prior Therapy  Intensive and Management CDT to BLE; fitted with OTS, ccl 2 Juzo compression stockings      Precautions   Precautions  --   hx leg wounds, erecurrent cellulitis, Hx lymphorrhea     Restrictions   Weight Bearing Restrictions  No      Balance Screen   Has the patient fallen in the past 6 months  No    Has the patient had a decrease in activity level because of a fear of falling?   Yes   Covid restrictions leading to decreased activity level   Is the patient reluctant to leave their home because of a fear of falling?   Yes      Home  Environment   Additional Comments  Pt is primary caregiver for spouse with CKD on dialysis. Pt has resumed local driving    Lives With  Spouse      Prior Function   Level of Independence  Independent with basic ADLs;Independent with household mobility without device;Independent with community mobility without device;Independent with gait;Needs assistance with homemaking;Needs assistance with transfers    Vocation  Retired    Leisure  family      IADL   Shopping  Shops independently for [employer]  Does personal laundry completely;[place] small items, rinses stockings, etc.;Maintains house alone or with occasional assistance;Performs light daily tasks such as dishwashing, bed making;Needs help with all home maintenance tasks    Meal Prep  Plans, prepares and serves adequate meals independently    [place] own vehicle    Medication Management  Is  responsible for taking medication in correct dosages at correct time    Psychiatrist financial matters independently (budgets, writes checks, pays rent, bills goes to bank), collects and keeps track of income      Mobility   Mobility Status  --   slowed pace     Vision - History   Baseline Vision  Wears glasses all the time      Activity Tolerance   Activity Tolerance  Endurance does not limit participation in activity      Cognition   Overall Cognitive Status  Within Functional Limits for tasks assessed      Observation/Other Assessments   Outcome Measures  BLE comparative limb volumetrics, LLIS, LEFS      Posture/Postural Control   Posture/Postural Control  No significant limitations      Sensation   Light Touch  Appears  Intact    Additional Comments  c/o decreased and altered sensation in B feet      Coordination   Gross Motor Movements are Fluid and Coordinated  Yes      ROM / Strength   AROM / PROM / Strength  AROM;Strength      AROM   Overall AROM   Within functional limits for tasks performed    AROM Assessment Site  Hip;Knee;Ankle      Strength   Overall Strength  Within functional limits for tasks performed      Hand Function   Left Hand Gross Grasp  Impaired    Left Hand Grip (lbs)  NT- in splint    Left Hand Lateral Pinch  --   impaired   Left 3 point pinch  --   impaired   Comment  pt reports dorsal extensor tenson injury to 3rd digit of L hand/. Wearing splint and seeing PT               OT Treatments/Exercises (OP) - 09/20/19 0001      ADLs   Grooming  difficulty grasping hairbrush to groom hair    UB Dressing  difficulty buttoning    LB Dressing  difficulty  fitting LB clothing and  preferred shoes. Difficulty tyoing shoes, buttoning,     Bathing  difficulty reaching feet and legs to complete batthing, nail and skin care, and skin inspection    Functional Mobility  used arms for STS and car transfers- modified  independence.    Cooking  needs seated rest breaks. hand function limits kichen task perforance    Home Maintenance  impaired  2/2 leg swimpaired. Requires seated rest breaksellin     Driving  short distances only. long distanced limited by increased leg swelling and associated pain with extended dependent positioning.    Work  productive activities impaired when requiring standing, walking, extended sitting. needs seated rest breaks    Leisure  impaired     when entails standing, walking, extended sitting      Manual Therapy   Manual Therapy  Edema management            OT Education - 09/20/19 1614    Education Details  Reviewed Lymphedema precautions. Reviewed LE self care protocols and importance of skin care. Reviewed goals of Intensive Phase CDT    Person(s) Educated  Patient    Methods  Explanation;Demonstration;Handout    Comprehension  Verbalized understanding;Returned demonstration          OT Long Term Goals - 09/20/19 1624      OT LONG TERM GOAL #1   Title  Pt will demonstrate understanding of lymphedema (LE) precautions / prevention principals, including signs / symptoms of cellulitis infection with modified independence using LE Workbook as printed reference to identify 6 precautions without verbal cues by end of 3rd  OT Rx visit.     Baseline  erbal cues    Time  3    Period  Days    Status  New      OT LONG TERM GOAL #2   Title  Pt will be able to apply knee-length, multi-layer, short stretch compression wraps using correct gradient techniques with moderate  caregiver assistance to achieve optimal limb volume reduction during Intensive Phase CDT, and to return affected limb(s) as closely as possible, to premorbid size and shape.    Baseline  supervision    Time  4    Period  Days  Status  New    Target Date  --   4th OT visit     OT LONG TERM GOAL #3   Title  Pt to achieve at least  10% limb volume reduction in affected limb(s)  bilaterally during  Intensive Phase CDT to control limb swelling, to improve tissue integrity and immune function, to improve ADLs performance and to improve functional mobility/ transfer, and to improve body image and self-esteem.    Baseline  max A    Time  8    Period  Weeks    Status  New      OT LONG TERM GOAL #4   Title  Pt will achieve 100% compliance with daily LE self-care home program components, including daily  skin care, simple self-MLD, gradient compression wraps/ compression garments/devices, and therapeutic exercise with Mod CG support to ensure optimal Intensive Phase limb volume reduction to expedite compression garment/ device fitting.    Baseline  50% compliant    Time  12    Period  Weeks    Status  New    Target Date  12/19/19      OT LONG TERM GOAL #5   Title  Pt will use appropriate  assistive devices during LE self-care training to don compression garments/devices with modified independence to ensure optimal LE self-management over time and to limit progression of chronic LE.    Baseline  min A    Time  12    Period  Weeks    Status  New    Target Date  12/19/19      OT LONG TERM GOAL #6   Title  Pt will retain optimal limb volume reductions achieved during Intensive Phase CDT with no more than 3% volume increase with ongoing CG assistance to limit LE progression and further functional decline.    Baseline  min A    Time  6    Period  Months    Status  New    Target Date  03/18/20            Plan - 09/20/19 1628    Clinical Impression Statement  Sharon [name] is a 73 y o female presenting with moderate, stage II, BLE lymphedema 2/2 multiple orthopedic trauma, recurrent cellulitis, hx leg wounds,  and obesity. Recent Dopler study rulled out CVI as a contributing cause, but CKD, CHF and obesity and dependent positioning in this case do exacerbate the patient's chronic progressive condition. Pt reports she is somewhat compliant with daily leg cmopression using 20-30 mmHg, ccl 1  knee length, off the shelf compression stockings. She no longer uses thigh length compression garments or previously recommended ccl 2 knee highs. Skin condition is worsened and swellin bilaterally is worsened bilaterally. Pt is in increased pain associated with swelling and finds her self limiting walking because of the exacerbation she experienced since infection in 9/20. Pt will benefit from skilled oOT for LE care to reduce leg swelling, associated pain and to improve functional performance limited in all domains by chronic progressive lymphedema in this case.  Without Complete Decongestive Therapy to arrest progression , the condition will worsen and Pt will experience further functional decline.       Patient will benefit from skilled therapeutic intervention in order to improve the following deficits and impairments:   Body Structure / Function / Physical Skills: ADL, Balance, Decreased knowledge of use of DME, Decreased knowledge of precautions, Edema, Flexibility, IADL, Pain, Skin integrity, Mobility  Visit Diagnosis: Lymphedema, not elsewhere classified - Plan: Ot plan of care cert/re-cert    Problem List Patient Active Problem List   Diagnosis Date Noted  . Vitamin D deficiency 06/28/2019  . Prediabetes 06/28/2019  . GERD (gastroesophageal reflux disease) 12/07/2018  . Vitamin B12 deficiency 12/05/2018  . Senile purpura ([place]) 07/28/2018  . Chronic venous insufficiency 07/14/2018  . Lymphedema 07/14/2018  . Aneurysm of descending thoracic aorta (HCC) 05/04/2018  . Allergic rhinitis 02/16/2018  . Aortic atherosclerosis ([place]) 01/13/2018  . Status post placement of implantable loop recorder 01/12/2018  . Chronic kidney disease, stage 3 01/12/2018  . Cerebrovascular accident (CVA) due to embolism of precerebral artery ([place])   . TIA (transient ischemic attack) 01/04/2018  . Osteoarthritis of wrist 12/09/2017  . CAD (coronary artery disease) 11/25/2017  . Caregiver stress  11/25/2017  . Menopause 08/14/2017  . De Quervain's tenosynovitis, right 07/17/2017  . Stress incontinence 06/19/2017  . Personal history of tobacco use, presenting hazards to health 04/01/2017  . Anxiety 03/13/2017  . Glossitis 11/14/2016  . Advanced care planning/counseling discussion 10/17/2016  . Benign neoplasm of sigmoid colon   . COPD (chronic obstructive pulmonary disease) ([place]) 10/17/2015  . Mass of upper inner quadrant of right breast 06/22/2015    Andrey Spearman, MS, OTR/L, [facility] 09/20/19 5:06 PM  [place] MAIN [facility] SERVICES [address] [place], [name], [address] Phone: [phone]   Fax:  [phone]  Name: Sharon [name] MRN: [id number] Date of Birth: [birth year]

## 2019-09-22 ENCOUNTER — Other Ambulatory Visit: Payer: Self-pay

## 2019-09-22 ENCOUNTER — Ambulatory Visit (INDEPENDENT_AMBULATORY_CARE_PROVIDER_SITE_OTHER): Payer: Medicare Other

## 2019-09-22 DIAGNOSIS — E538 Deficiency of other specified B group vitamins: Secondary | ICD-10-CM

## 2019-09-22 MED ORDER — CYANOCOBALAMIN 1000 MCG/ML IJ SOLN
1000.0000 ug | Freq: Once | INTRAMUSCULAR | Status: AC
  Administered 2019-09-22: 1000 ug via INTRAMUSCULAR

## 2019-09-23 ENCOUNTER — Ambulatory Visit (INDEPENDENT_AMBULATORY_CARE_PROVIDER_SITE_OTHER): Payer: Medicare Other | Admitting: *Deleted

## 2019-09-23 DIAGNOSIS — I6310 Cerebral infarction due to embolism of unspecified precerebral artery: Secondary | ICD-10-CM | POA: Diagnosis not present

## 2019-09-26 ENCOUNTER — Encounter: Payer: Medicare Other | Admitting: Occupational Therapy

## 2019-09-26 LAB — CUP PACEART REMOTE DEVICE CHECK
Date Time Interrogation Session: 20201211134137
Implantable Pulse Generator Implant Date: 20190328

## 2019-09-28 ENCOUNTER — Telehealth: Payer: Self-pay

## 2019-09-28 ENCOUNTER — Ambulatory Visit: Payer: Self-pay | Admitting: Licensed Clinical Social Worker

## 2019-09-28 ENCOUNTER — Encounter: Payer: Medicare Other | Admitting: Occupational Therapy

## 2019-09-28 NOTE — Chronic Care Management (AMB) (Signed)
  Care Management   Follow Up Note   09/28/2019 Name: SADINA HAISTEN MRN: [id number] DOB: 03/11/1946  Referred by: [name], NP Reason for referral : Jessie is a 73 y.o. year old female who is a primary care patient of [name], [name], NP. The care management team was consulted for assistance with care management and care coordination needs.    Review of patient status, including review of consultants reports, relevant laboratory and other test results, and collaboration with appropriate care team members and the patient's provider was performed as part of comprehensive patient evaluation and provision of chronic care management services.    LCSW completed CCM outreach attempt today but was unable to reach patient successfully. A HIPPA compliant voice message was left encouraging patient to return call once available. LCSW rescheduled CCM SW appointment as well.  A HIPPA compliant phone message was left for the patient providing contact information and requesting a return call.   [name], BSW, MSW, [facility] Care Management [email or web address] Phone: [phone]

## 2019-09-30 ENCOUNTER — Other Ambulatory Visit: Payer: Self-pay

## 2019-09-30 MED ORDER — CLOPIDOGREL BISULFATE 75 MG PO TABS: ORAL_TABLET | ORAL | 3 refills | Status: DC

## 2019-09-30 NOTE — Telephone Encounter (Signed)
Refill request for Clopidogrel Upcoming appt 10/13/2019

## 2019-10-03 ENCOUNTER — Encounter: Payer: Medicare Other | Admitting: Occupational Therapy

## 2019-10-05 ENCOUNTER — Other Ambulatory Visit: Payer: Self-pay

## 2019-10-05 ENCOUNTER — Encounter (INDEPENDENT_AMBULATORY_CARE_PROVIDER_SITE_OTHER): Payer: Self-pay

## 2019-10-05 ENCOUNTER — Telehealth (INDEPENDENT_AMBULATORY_CARE_PROVIDER_SITE_OTHER): Payer: Self-pay

## 2019-10-05 ENCOUNTER — Encounter (INDEPENDENT_AMBULATORY_CARE_PROVIDER_SITE_OTHER): Payer: Self-pay | Admitting: Nurse Practitioner

## 2019-10-05 ENCOUNTER — Ambulatory Visit (INDEPENDENT_AMBULATORY_CARE_PROVIDER_SITE_OTHER): Payer: Medicare Other | Admitting: Nurse Practitioner

## 2019-10-05 VITALS — BP 121/75 | HR 98 | Resp 16 | Wt 224.8 lb

## 2019-10-05 DIAGNOSIS — L03116 Cellulitis of left lower limb: Secondary | ICD-10-CM

## 2019-10-05 DIAGNOSIS — I890 Lymphedema, not elsewhere classified: Secondary | ICD-10-CM | POA: Diagnosis not present

## 2019-10-05 MED ORDER — PREDNISONE 10 MG (21) PO TBPK: ORAL_TABLET | ORAL | 0 refills | Status: DC

## 2019-10-05 MED ORDER — CEPHALEXIN 500 MG PO CAPS: 500.0000 mg | ORAL_CAPSULE | Freq: Four times a day (QID) | ORAL | 0 refills | Status: DC

## 2019-10-05 NOTE — Telephone Encounter (Signed)
We can bring her in as long as it before that last patient we currently have scheduled today.  However the patient previously required IV antibiotics to treat her cellulitis, so if it as severe as last time I may advise her to go to the ED for evaluation depending on what we see.  If she is unable to make it today, she should again go to urgent care today.

## 2019-10-05 NOTE — Telephone Encounter (Signed)
Patient is schedule to come in today and made aware with medical advice

## 2019-10-10 NOTE — Progress Notes (Signed)
SUBJECTIVE:  Patient ID: Sharon [name], female    DOB: [birth year], 73 y.o.   MRN: [id number] Chief Complaint  Patient presents with  . Follow-up    leg redness and swelling    HPI  Sharon [name] is a 73 y.o. female that contacted our office for urgent visit on 10/04/2019 due to concern for lower extremity edema as well as redness.  Several months ago the patient had pretty severe cellulitis which required hospitalization of the right lower extremity.  However today the issues with her left lower extremity.  The patient noted that the leg steadily became more red over time and it also became painful and sore to the touch.  She denies any specific precipitating event.  She is also concerned because the right lower extremity has a little bit of redness as well.  She has been utilizing her lymphedema pump on a regular basis.  She is also been utilizing her medical grade 1 compression stockings on a regular basis as well as trying to keep her lower extremities moisturized as much as possible.  She denies any fever, chills, nausea, vomiting or diarrhea.  She denies any chest pain shortness of breath.  Past Medical History:  Diagnosis Date  . Anxiety   . Arthritis    hands, upper back  . Asthma   . COPD (chronic obstructive pulmonary disease) ([place])   . History of cervical cancer   . Menopausal disorder   . Osteoporosis   . Pneumonia 1960  . Spasm of abdominal muscles of right side    intermittent  . TMJ (dislocation of temporomandibular joint)   . Wears dentures    partial lower    Past Surgical History:  Procedure Laterality Date  . ABDOMINAL HYSTERECTOMY  1970's  . bladder botox  2005  . BLADDER SUSPENSION  2004  . CATARACT EXTRACTION W/PHACO Right 03/09/2018   Procedure: CATARACT EXTRACTION PHACO AND INTRAOCULAR LENS PLACEMENT ([place]) right;  Surgeon: Eulogio Bear, MD;  Location: [place];  Service: Ophthalmology;  Laterality: Right;  CALL CELL 1ST  . CATARACT  EXTRACTION W/PHACO Left 04/19/2018   Procedure: CATARACT EXTRACTION PHACO AND INTRAOCULAR LENS PLACEMENT ([facility])  LEFT;  Surgeon: Eulogio Bear, MD;  Location: [place];  Service: Ophthalmology;  Laterality: Left;  . COLONOSCOPY WITH PROPOFOL N/A 12/13/2015   Procedure: COLONOSCOPY WITH PROPOFOL;  Surgeon: Lucilla Lame, MD;  Location: [place];  Service: Endoscopy;  Laterality: N/A;  . LOOP RECORDER INSERTION N/A 01/07/2018   Procedure: LOOP RECORDER INSERTION;  Surgeon: Deboraha Sprang, MD;  Location: [facility];  Service: Cardiovascular;  Laterality: N/A;  . POLYPECTOMY N/A 12/13/2015   Procedure: POLYPECTOMY;  Surgeon: Lucilla Lame, MD;  Location: [place] [facility];  Service: Endoscopy;  Laterality: N/A;  SIGMOID COLON POLYPS X  5  . SHOULDER ARTHROSCOPY W/ ROTATOR CUFF REPAIR Right 1998  . TEE WITHOUT CARDIOVERSION N/A 01/06/2018   Procedure: TRANSESOPHAGEAL ECHOCARDIOGRAM (TEE);  Surgeon: Minna Merritts, MD;  Location: [facility];  Service: Cardiovascular;  Laterality: N/A;  . TONSILLECTOMY AND ADENOIDECTOMY      Social History   Socioeconomic History  . Marital status: Married    Spouse name: Addysyn Fern  . Number of children: 1  . Years of education: Not on file  . Highest education level: Some college, no degree  Occupational History  . Occupation: retired  Tobacco Use  . Smoking status: Former Smoker    Years: 56.00  Types: Cigarettes    Quit date: 07/14/2016    Years since quitting: 3.2  . Smokeless tobacco: Never Used  Substance and Sexual Activity  . Alcohol use: Yes    Alcohol/week: 0.0 standard drinks  . Drug use: No  . Sexual activity: Not Currently    Birth control/protection: Post-menopausal  Other Topics Concern  . 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 Difficulty of Paying Living Expenses: Not on file  Food Insecurity:   .  Worried About Charity fundraiser in the Last Year: Not on file  . Ran Out of Food in the Last Year: Not on file  Transportation Needs:   . Lack of Transportation (Medical): Not on file  . Lack of Transportation (Non-Medical): Not on file  Physical Activity:   . Days of Exercise per Week: Not on file  . Minutes of Exercise per Session: Not on file  Stress:   . Feeling of Stress : Not on file  Social Connections:   . Frequency of Communication with Friends and Family: Not on file  . Frequency of Social Gatherings with Friends and Family: Not on file  . Attends Religious Services: Not on file  . Active Member of Clubs or Organizations: Not on file  . Attends Archivist Meetings: Not on file  . Marital Status: Not on file  Intimate Partner Violence:   . Fear of Current or Ex-Partner: Not on file  . Emotionally Abused: Not on file  . Physically Abused: Not on file  . Sexually Abused: Not on file    Family History  Problem Relation Age of Onset  . Diabetes Mother   . Heart disease Mother   . Stroke Mother   . Stroke Maternal Grandmother     Allergies  Allergen Reactions  . Baclofen Swelling  . Bactrim [Sulfamethoxazole-Trimethoprim] Nausea And Vomiting  . Ibuprofen Rash    Mouth swelling  . Latex Rash    Some bandaids, some gloves; BLOOD TEST NEGATIVE  . Librium [Chlordiazepoxide] Itching    Dizziness   . Naprosyn [Naproxen] Rash    Mouth swelling  . Other Rash    [place] nuts - mouth swelling     Review of Systems   Review of Systems: Negative Unless Checked Constitutional: _0 Weight loss  _1 Fever  _2 Chills Cardiac: _3 Chest pain   _4  Atrial Fibrillation  _5 Palpitations   _6 Shortness of breath when laying flat   _7 Shortness of breath with exertion. _8 Shortness of breath at rest Vascular:  _9 Pain in legs with walking   _10 Pain in legs with standing _11 Pain in legs when laying flat   _12 Claudication    _13 Pain in feet when laying flat    _14 History of DVT    _15 Phlebitis   _16 Swelling in legs   _17 Varicose veins   _18 Non-healing ulcers Pulmonary:   _19 Uses home oxygen   _20 Productive cough   _21 Hemoptysis   _22 Wheeze  _23 COPD   _24 Asthma Neurologic:  _25 Dizziness   _26 Seizures  _27 Blackouts _28 History of stroke   _29 History of TIA  _30 Aphasia   _31 Temporary Blindness   _32 Weakness or numbness in arm   _33 Weakness or numbness in leg Musculoskeletal:   _34 Joint swelling   _35 Joint pain   _36 Low back pain  _37  History of Knee Replacement _38 Arthritis _39 back Surgeries  _40  Spinal Stenosis    Hematologic:  _41 Easy bruising  _42 Easy bleeding   _43 Hypercoagulable state   _44   Anemic Gastrointestinal:  _0 Diarrhea   _1 Vomiting  _2 Gastroesophageal reflux/heartburn   _3 Difficulty swallowing. _4 Abdominal pain Genitourinary:  _5 Chronic kidney disease   _6 Difficult urination  _7 Anuric   _8 Blood in urine _9 Frequent urination  _10 Burning with urination   _11 Hematuria Skin:  _12 Rashes   _13 Ulcers _14 Wounds Psychological:  _15 History of anxiety   _16  History of major depression  _17  Memory Difficulties      OBJECTIVE:   Physical Exam  BP 121/75 (BP Location: Right Arm)   Pulse 98   Resp 16   Wt 224 lb 12.8 oz (102 kg)   LMP  (LMP Unknown)   BMI 34.18 kg/m   Gen: WD/WN, NAD Head: [place]/AT, No temporalis wasting.  Ear/Nose/Throat: Hearing grossly intact, nares w/o erythema or drainage Eyes: PER, EOMI, sclera nonicteric.  Neck: Supple, no masses.  No JVD.  Pulmonary:  Good air movement, no use of accessory muscles.  Cardiac: RRR Vascular:  2+ edema right lower extremity with 3+ on left, extreme erythema of left lower extremity consistent with cellulitis Vessel Right Left  Dorsalis Pedis Palpable Palpable  Posterior Tibial Palpable Palpable   Gastrointestinal: soft, non-distended. No guarding/no peritoneal signs.  Musculoskeletal: M/S 5/5 throughout.  No deformity or atrophy.  Neurologic: Pain and light touch intact in extremities.  Symmetrical.  Speech is fluent. Motor exam as listed  above. Psychiatric: Judgment intact, Mood & affect appropriate for pt's clinical situation. Dermatologic:  Cellulitis left lower extremity        ASSESSMENT AND PLAN:  1. Cellulitis of left lower extremity Previously when the patient had the extensive cellulitis that she to be hospitalized for she was initially put on doxycycline which was ultimately not effective.  The patient was transition to Keflex and prednisone which was effective for treating her cellulitis last time.  In this instance we will utilize Keflex and prednisone again to treat the patient's cellulitis.   The patient will be in office weekly to have her Unna wraps changed and we will evaluate with next change. - cephALEXin (KEFLEX) 500 MG capsule; Take 1 capsule (500 mg total) by mouth 4 (four) times daily.  Dispense: 20 capsule; Refill: 0 - predniSONE (STERAPRED UNI-PAK 21 TAB) 10 MG (21) TBPK tablet; Per Packaging Directions  Dispense: 21 tablet; Refill: 0  2. Lymphedema Previously the patient had been engaging in all prescribed conservative therapy such as utilizing compression stockings, elevation, exercise and use of her lymphedema pump.  The patient will continue with these activities however use of the lymphedema pump will be delayed by several days while her cellulitis heals.  The patient also had extensive swelling so the patient was placed in bilateral Unna wraps as well to gain control swelling to assist with further healing of the cellulitis.  Patient will present to the office on a weekly basis for changing of her own wraps with reevaluation in 4 weeks.   Current Outpatient Medications on File Prior to Visit  Medication Sig Dispense Refill  . albuterol (PROAIR HFA) 108 (90 Base) MCG/ACT inhaler Inhale 2 puffs into the lungs every 6 (six) hours as needed for wheezing or shortness of breath. 8.5 Inhaler 12  . aspirin 81 MG chewable tablet Chew by mouth daily.    Marland Kitchen atorvastatin (LIPITOR) 40 MG tablet TAKE 1 TABLET BY  MOUTH ONCE A DAY AT 6 PM 90 tablet 3  . benzonatate (TESSALON PERLES) 100 MG capsule Take 1 capsule (100 mg total) by mouth 3 (three) times daily as needed for cough. 45 capsule 0  .  budesonide-formoterol (SYMBICORT) 160-4.5 MCG/ACT inhaler USE 2 PUFFS BY MOUTH TWICE DAILY. 10.2 g 6  . citalopram (CELEXA) 20 MG tablet Take 1 tablet (20 mg total) by mouth daily. 90 tablet 3  . clopidogrel (PLAVIX) 75 MG tablet TAKE 1 TABLET(75 MG) BY MOUTH DAILY 90 tablet 3  . Cyanocobalamin 1000 MCG/ML KIT Inject 1,000 mcg as directed every 30 (thirty) days.    Marland Kitchen estradiol (VIVELLE-DOT) 0.1 MG/24HR patch APPLY 1 PATCH ONTO THE SKIN 2 TIMES A WEEK 8 patch 11  . Loratadine (CLARITIN PO) Take by mouth daily.    . meclizine (ANTIVERT) 12.5 MG tablet Take 1 tablet (12.5 mg total) by mouth 3 (three) times daily as needed for dizziness (only if needed). 30 tablet 0  . montelukast (SINGULAIR) 10 MG tablet Take 1 tablet (10 mg total) by mouth at bedtime. 90 tablet 3  . Multiple Vitamin (MULTIVITAMIN) tablet Take 2 tablets by mouth daily.     . pantoprazole (PROTONIX) 40 MG tablet Take 1 tablet (40 mg total) by mouth daily. 90 tablet 3  . triamcinolone cream (KENALOG) 0.1 % Apply 1 application topically 2 (two) times daily. 30 g 0   No current facility-administered medications on file prior to visit.    There are no Patient Instructions on file for this visit. No follow-ups on file.   Kris Hartmann, NP  This note was completed with Sales executive.  Any errors are purely unintentional.

## 2019-10-11 ENCOUNTER — Encounter (INDEPENDENT_AMBULATORY_CARE_PROVIDER_SITE_OTHER): Payer: Self-pay | Admitting: Nurse Practitioner

## 2019-10-11 ENCOUNTER — Encounter (INDEPENDENT_AMBULATORY_CARE_PROVIDER_SITE_OTHER): Payer: Self-pay | Admitting: Vascular Surgery

## 2019-10-11 ENCOUNTER — Other Ambulatory Visit: Payer: Self-pay

## 2019-10-11 ENCOUNTER — Ambulatory Visit (INDEPENDENT_AMBULATORY_CARE_PROVIDER_SITE_OTHER): Payer: Medicare Other | Admitting: Vascular Surgery

## 2019-10-11 VITALS — BP 119/72 | HR 103 | Resp 18 | Wt 221.0 lb

## 2019-10-11 DIAGNOSIS — I890 Lymphedema, not elsewhere classified: Secondary | ICD-10-CM | POA: Diagnosis not present

## 2019-10-11 DIAGNOSIS — N183 Chronic kidney disease, stage 3 unspecified: Secondary | ICD-10-CM | POA: Diagnosis not present

## 2019-10-11 DIAGNOSIS — J410 Simple chronic bronchitis: Secondary | ICD-10-CM | POA: Diagnosis not present

## 2019-10-11 NOTE — Assessment & Plan Note (Signed)
Her lower extremity swelling was not markedly improved with a week of Unna boots or antibiotics.  We are going to switch Unna boots and use a calamine type Haematologist and a 3 layer calamine Unna boot was placed today.  We will try to come off of medications and see how she does with conservative therapy and reassess her in the next couple of weeks.

## 2019-10-11 NOTE — Assessment & Plan Note (Signed)
Continue pulmonary medications and aerosols as already ordered, these medications have been reviewed and there are no changes at this time.   

## 2019-10-11 NOTE — Assessment & Plan Note (Signed)
Can certainly contribute to LE swelling.

## 2019-10-11 NOTE — Progress Notes (Signed)
MRN : [id number]  Sharon [name] is a 73 y.o. (1946/08/12) female who presents with chief complaint of  Chief Complaint  Patient presents with  . Follow-up    BLE swelling and redness  .  History of Present Illness: Patient returns today in follow up of her lower extremity swelling and cellulitis.  A week of antibiotics and prednisone really have not made a major difference.  The zinc oxide Unna boots were very irritating to the patient.  She still has some swelling although it is not dramatic at this point.  No fevers or chills.  Current Outpatient Medications  Medication Sig Dispense Refill  . albuterol (PROAIR HFA) 108 (90 Base) MCG/ACT inhaler Inhale 2 puffs into the lungs every 6 (six) hours as needed for wheezing or shortness of breath. 8.5 Inhaler 12  . aspirin 81 MG chewable tablet Chew by mouth daily.    Marland Kitchen atorvastatin (LIPITOR) 40 MG tablet TAKE 1 TABLET BY MOUTH ONCE A DAY AT 6 PM 90 tablet 3  . benzonatate (TESSALON PERLES) 100 MG capsule Take 1 capsule (100 mg total) by mouth 3 (three) times daily as needed for cough. 45 capsule 0  . budesonide-formoterol (SYMBICORT) 160-4.5 MCG/ACT inhaler USE 2 PUFFS BY MOUTH TWICE DAILY. 10.2 g 6  . cephALEXin (KEFLEX) 500 MG capsule Take 1 capsule (500 mg total) by mouth 4 (four) times daily. 20 capsule 0  . citalopram (CELEXA) 20 MG tablet Take 1 tablet (20 mg total) by mouth daily. 90 tablet 3  . clopidogrel (PLAVIX) 75 MG tablet TAKE 1 TABLET(75 MG) BY MOUTH DAILY 90 tablet 3  . Cyanocobalamin 1000 MCG/ML KIT Inject 1,000 mcg as directed every 30 (thirty) days.    Marland Kitchen estradiol (VIVELLE-DOT) 0.1 MG/24HR patch APPLY 1 PATCH ONTO THE SKIN 2 TIMES A WEEK 8 patch 11  . Loratadine (CLARITIN PO) Take by mouth daily.    . meclizine (ANTIVERT) 12.5 MG tablet Take 1 tablet (12.5 mg total) by mouth 3 (three) times daily as needed for dizziness (only if needed). 30 tablet 0  . montelukast (SINGULAIR) 10 MG tablet Take 1 tablet (10 mg total) by  mouth at bedtime. 90 tablet 3  . Multiple Vitamin (MULTIVITAMIN) tablet Take 2 tablets by mouth daily.     . pantoprazole (PROTONIX) 40 MG tablet Take 1 tablet (40 mg total) by mouth daily. 90 tablet 3  . predniSONE (STERAPRED UNI-PAK 21 TAB) 10 MG (21) TBPK tablet Per Packaging Directions 21 tablet 0  . triamcinolone cream (KENALOG) 0.1 % Apply 1 application topically 2 (two) times daily. 30 g 0   No current facility-administered medications for this visit.    Past Medical History:  Diagnosis Date  . Anxiety   . Arthritis    hands, upper back  . Asthma   . COPD (chronic obstructive pulmonary disease) ([place])   . History of cervical cancer   . Menopausal disorder   . Osteoporosis   . Pneumonia 1960  . Spasm of abdominal muscles of right side    intermittent  . TMJ (dislocation of temporomandibular joint)   . Wears dentures    partial lower    Past Surgical History:  Procedure Laterality Date  . ABDOMINAL HYSTERECTOMY  1970's  . bladder botox  2005  . BLADDER SUSPENSION  2004  . CATARACT EXTRACTION W/PHACO Right 03/09/2018   Procedure: CATARACT EXTRACTION PHACO AND INTRAOCULAR LENS PLACEMENT ([place]) right;  Surgeon: Eulogio Bear, MD;  Location: [place];  Service: Ophthalmology;  Laterality: Right;  CALL CELL 1ST  . CATARACT EXTRACTION W/PHACO Left 04/19/2018   Procedure: CATARACT EXTRACTION PHACO AND INTRAOCULAR LENS PLACEMENT ([facility])  LEFT;  Surgeon: Eulogio Bear, MD;  Location: [place];  Service: Ophthalmology;  Laterality: Left;  . COLONOSCOPY WITH PROPOFOL N/A 12/13/2015   Procedure: COLONOSCOPY WITH PROPOFOL;  Surgeon: Lucilla Lame, MD;  Location: [place];  Service: Endoscopy;  Laterality: N/A;  . LOOP RECORDER INSERTION N/A 01/07/2018   Procedure: LOOP RECORDER INSERTION;  Surgeon: Deboraha Sprang, MD;  Location: [facility];  Service: Cardiovascular;  Laterality: N/A;  . POLYPECTOMY N/A 12/13/2015   Procedure: POLYPECTOMY;   Surgeon: Lucilla Lame, MD;  Location: [place];  Service: Endoscopy;  Laterality: N/A;  SIGMOID COLON POLYPS X  5  . SHOULDER ARTHROSCOPY W/ ROTATOR CUFF REPAIR Right 1998  . TEE WITHOUT CARDIOVERSION N/A 01/06/2018   Procedure: TRANSESOPHAGEAL ECHOCARDIOGRAM (TEE);  Surgeon: Minna Merritts, MD;  Location: [facility];  Service: Cardiovascular;  Laterality: N/A;  . TONSILLECTOMY AND ADENOIDECTOMY      Social History Social History   Tobacco Use  . Smoking status: Former Smoker    Years: 56.00    Types: Cigarettes    Quit date: 07/14/2016    Years since quitting: 3.2  . Smokeless tobacco: Never Used  Substance Use Topics  . Alcohol use: Yes    Alcohol/week: 0.0 standard drinks  . Drug use: No    Family History  Problem Relation Age of Onset  . Diabetes Mother   . Heart disease Mother   . Stroke Mother   . Stroke Maternal Grandmother      Allergies  Allergen Reactions  . Baclofen Swelling  . Bactrim [Sulfamethoxazole-Trimethoprim] Nausea And Vomiting  . Ibuprofen Rash    Mouth swelling  . Latex Rash    Some bandaids, some gloves; BLOOD TEST NEGATIVE  . Librium [Chlordiazepoxide] Itching    Dizziness   . Naprosyn [Naproxen] Rash    Mouth swelling  . Other Rash    [place] nuts - mouth swelling     REVIEW OF SYSTEMS (Negative unless checked)  Constitutional: _0 Weight loss  _1 Fever  _2 Chills Cardiac: _3 Chest pain   _4 Chest pressure   _5 Palpitations   _6 Shortness of breath when laying flat   _7 Shortness of breath at rest   _8 Shortness of breath with exertion. Vascular:  _9 Pain in legs with walking   _10 Pain in legs at rest   _11 Pain in legs when laying flat   _12 Claudication   _13 Pain in feet when walking  _14 Pain in feet at rest  _15 Pain in feet when laying flat   _16 History of DVT   _17 Phlebitis   _18 Swelling in legs   _19 Varicose veins   _20 Non-healing ulcers Pulmonary:   _21 Uses home oxygen   _22 Productive cough   _23 Hemoptysis   _24 Wheeze  _25 COPD    _26 Asthma Neurologic:  _27 Dizziness  _28 Blackouts   _29 Seizures   _30 History of stroke   _31 History of TIA  _32 Aphasia   _33 Temporary blindness   _34 Dysphagia   _35 Weakness or numbness in arms   _36 Weakness or numbness in legs Musculoskeletal:  _37 Arthritis   _38 Joint swelling   _39 Joint pain   _40 Low back pain Hematologic:  _41 Easy bruising  _42 Easy bleeding   _43 Hypercoagulable state   _44 Anemic   Gastrointestinal:  _45 Blood in stool   _46 Vomiting blood  _47 Gastroesophageal reflux/heartburn   _48 Abdominal pain Genitourinary:  _49 Chronic kidney disease   _50 Difficult urination  _51 Frequent urination  _52 Burning  with urination   _0 Hematuria Skin:  _1 Rashes   _2 Ulcers   _3 Wounds Psychological:  _4 History of anxiety   _5  History of major depression.  Physical Examination  BP 119/72 (BP Location: Right Arm)   Pulse (!) 103   Resp 18   Wt 221 lb (100.2 kg)   LMP  (LMP Unknown)   BMI 33.60 kg/m  Gen:  WD/WN, NAD Head: [place]/AT, No temporalis wasting. Ear/Nose/Throat: Hearing grossly intact, nares w/o erythema or drainage Eyes: Conjunctiva clear. Sclera non-icteric Neck: Supple.  Trachea midline Pulmonary:  Good air movement, no use of accessory muscles.  Cardiac: RRR, no JVD Vascular:  Vessel Right Left  Radial Palpable Palpable                          PT 1+ Palpable 1+ Palpable  DP 1+ Palpable 1+ Palpable    Musculoskeletal: M/S 5/5 throughout.  No deformity or atrophy.  Mild erythema bilateral a little worse on the left than the right.  1+ bilateral lower extremity edema. Neurologic: Sensation grossly intact in extremities.  Symmetrical.  Speech is fluent.  Psychiatric: Judgment intact, Mood & affect appropriate for pt's clinical situation. Dermatologic: No rashes or ulcers noted.  No cellulitis or open wounds.       Labs Recent Results (from the past 2160 hour(s))  CUP PACEART REMOTE DEVICE CHECK     Status: None   Collection Time: 07/19/19  6:43 PM  Result Value Ref Range   Date Time  Interrogation Session [id number]    Pulse Generator Manufacturer MERM    Pulse Gen Model LNQ11 Reveal LINQ    Pulse Gen Serial Number [id number] [place] [facility] Name [facility]    Implantable Pulse Generator Type ICM/ILR    Implantable Pulse Generator Implant Date [id number]    Eval Rhythm SR at 73 bpm   CUP PACEART REMOTE DEVICE CHECK     Status: None   Collection Time: 08/21/19  6:38 PM  Result Value Ref Range   Date Time Interrogation Session [id number]    Pulse Generator Manufacturer MERM    Pulse Gen Model LNQ11 Reveal LINQ    Pulse Gen Serial Number [id number]    Clinic Name [facility]    Implantable Pulse Generator Type ICM/ILR    Implantable Pulse Generator Implant Date [id number]   CUP PACEART REMOTE DEVICE CHECK     Status: None   Collection Time: 09/23/19  1:41 PM  Result Value Ref Range   Date Time Interrogation Session [id number]    Pulse Generator Manufacturer MERM    Pulse Gen Model JIR67 Reveal LINQ    Pulse Gen Serial Number [id number]    Clinic Name [place] Pulse Generator Type ICM/ILR    Implantable Pulse Generator Implant Date [id number]     Radiology CUP PACEART REMOTE DEVICE CHECK  Result Date: 09/26/2019 [facility] summary report received. Battery status OK. Normal device function. No new symptom episodes, tachy episodes, brady, or pause episodes. No new AF episodes. Monthly summary reports and ROV/PRN   Assessment/Plan  Chronic kidney disease, stage 3 ([place]) Can certainly contribute to LE swelling.   COPD (chronic obstructive pulmonary disease) ([facility]) Continue pulmonary medications and aerosols as already ordered, these medications have been reviewed and there are no changes at this time.    Lymphedema Her lower extremity swelling was not markedly improved with a week of Unna boots or antibiotics.  We are going to switch [employer]  boots and use a calamine type Unna boot and a 3 layer calamine Unna boot was placed today.  We  will try to come off of medications and see how she does with conservative therapy and reassess her in the next couple of weeks.    Leotis Pain, MD  10/11/2019 12:13 PM    This note was created with Dragon medical transcription system.  Any errors from dictation are purely unintentional

## 2019-10-13 ENCOUNTER — Ambulatory Visit: Payer: Medicare Other | Admitting: Nurse Practitioner

## 2019-10-18 ENCOUNTER — Ambulatory Visit (INDEPENDENT_AMBULATORY_CARE_PROVIDER_SITE_OTHER): Payer: Medicare Other | Admitting: Pharmacist

## 2019-10-18 ENCOUNTER — Encounter: Admitting: Occupational Therapy

## 2019-10-18 ENCOUNTER — Ambulatory Visit (INDEPENDENT_AMBULATORY_CARE_PROVIDER_SITE_OTHER): Payer: Medicare Other | Admitting: Nurse Practitioner

## 2019-10-18 ENCOUNTER — Ambulatory Visit: Payer: Self-pay | Admitting: Pharmacist

## 2019-10-18 ENCOUNTER — Encounter (INDEPENDENT_AMBULATORY_CARE_PROVIDER_SITE_OTHER): Payer: Self-pay | Admitting: Nurse Practitioner

## 2019-10-18 ENCOUNTER — Other Ambulatory Visit: Payer: Self-pay

## 2019-10-18 VITALS — BP 138/78 | HR 116 | Resp 18 | Ht 69.0 in | Wt 222.0 lb

## 2019-10-18 DIAGNOSIS — I890 Lymphedema, not elsewhere classified: Secondary | ICD-10-CM

## 2019-10-18 DIAGNOSIS — I2510 Atherosclerotic heart disease of native coronary artery without angina pectoris: Secondary | ICD-10-CM

## 2019-10-18 DIAGNOSIS — I872 Venous insufficiency (chronic) (peripheral): Secondary | ICD-10-CM

## 2019-10-18 NOTE — Chronic Care Management (AMB) (Signed)
Chronic Care Management   Follow Up Note   10/18/2019 Name: Sharon [name] MRN: [id number] DOB: 1946/01/31  Referred by: [name], NP Reason for referral : Chronic Care Management (Medication Management)   Sharon [name] is a 74 y.o. year old female who is a primary care patient of [name], [name], NP. The CCM team was consulted for assistance with chronic disease management and care coordination needs.    Received call from patient today with medication management question.   Review of patient status, including review of consultants reports, relevant laboratory and other test results, and collaboration with appropriate care team members and the patient's provider was performed as part of comprehensive patient evaluation and provision of chronic care management services.    SDOH (Social Determinants of Health) screening performed today: None. See Care Plan for related entries.   Outpatient Encounter Medications as of 10/18/2019  Medication Sig Note  . albuterol (PROAIR HFA) 108 (90 Base) MCG/ACT inhaler Inhale 2 puffs into the lungs every 6 (six) hours as needed for wheezing or shortness of breath. 03/16/2019: QID during this acute illness, but generally 1-2x/day  . aspirin 81 MG chewable tablet Chew by mouth daily.   Marland Kitchen atorvastatin (LIPITOR) 40 MG tablet TAKE 1 TABLET BY MOUTH ONCE A DAY AT 6 PM   . benzonatate (TESSALON PERLES) 100 MG capsule Take 1 capsule (100 mg total) by mouth 3 (three) times daily as needed for cough.   . budesonide-formoterol (SYMBICORT) 160-4.5 MCG/ACT inhaler USE 2 PUFFS BY MOUTH TWICE DAILY.   . cephALEXin (KEFLEX) 500 MG capsule Take 1 capsule (500 mg total) by mouth 4 (four) times daily.   . citalopram (CELEXA) 20 MG tablet Take 1 tablet (20 mg total) by mouth daily. 05/27/2019: As needed  . clopidogrel (PLAVIX) 75 MG tablet TAKE 1 TABLET(75 MG) BY MOUTH DAILY   . Cyanocobalamin 1000 MCG/ML KIT Inject 1,000 mcg as directed every 30 (thirty) days.   Marland Kitchen  estradiol (VIVELLE-DOT) 0.1 MG/24HR patch APPLY 1 PATCH ONTO THE SKIN 2 TIMES A WEEK   . Loratadine (CLARITIN PO) Take by mouth daily.   . meclizine (ANTIVERT) 12.5 MG tablet Take 1 tablet (12.5 mg total) by mouth 3 (three) times daily as needed for dizziness (only if needed).   . montelukast (SINGULAIR) 10 MG tablet Take 1 tablet (10 mg total) by mouth at bedtime.   . Multiple Vitamin (MULTIVITAMIN) tablet Take 2 tablets by mouth daily.    . pantoprazole (PROTONIX) 40 MG tablet Take 1 tablet (40 mg total) by mouth daily.   . predniSONE (STERAPRED UNI-PAK 21 TAB) 10 MG (21) TBPK tablet Per Packaging Directions   . triamcinolone cream (KENALOG) 0.1 % Apply 1 application topically 2 (two) times daily.    No facility-administered encounter medications on file as of 10/18/2019.     Goals Addressed            This Visit's Progress     Patient Stated   . PharmD "My leg is still not completely healed" (pt-stated)       Current Barriers:  . Polypharmacy; complex patient with multiple comorbidities including chronic venous insufficiency, COPD, CAD, hx CVA, with recent cellulitis requiring multiple rounds of antibiotics.  . Calls me today for recommendations for probiotics, given hx of significant antibiotic use recently  o ASCVD (hx CVA); ASA 81 mg daily, clopidogrel 75 mg daily; atorvastatin 40 mg daily (last LDL 70);  o COPD/Allergies: Symbicort + Albuterol PRN; plan to evaluate  benefit of switching to LAMA/LABA moving forward; montelukast 10 mg daily, loratadine 10 mg daily  o Depression: citalopram 20 mg daily o Post-menopausal symptoms: estradiol 0.1 mg patch twice weekly o GERD: pantoprazole 40 mg dialy  Pharmacist Clinical Goal(s):  Marland Kitchen Over the next 90 days, patient will work with PharmD and provider towards optimized medication management   Interventions: . Discussed recommendations regarding probiotics, and that evidence does not consistently suggest any GI benefit. If patient wants to  take a probiotic, discussed some well-recommended brands, and to consider speaking with the pharmacist at the retail pharmacy about their recommendations . Reviewed phone visit w/ RN CM tomorrow . Uncertain if patient needs lifetime DAPT, or if clopidogrel monotherapy is sufficient. Will discuss w/ PCP  Patient Self Care Activities:  . Patient will take medications as prescribed  Please see past updates related to this goal by clicking on the "Past Updates" button in the selected goal          Plan: - Scheduled telephonic outreach 11/30/19 @ 3 pm  Catie Darnelle Maffucci, PharmD, [place] [phone]

## 2019-10-18 NOTE — Progress Notes (Signed)
History of Present Illness  There is no documented history at this time  Assessments & Plan   There are no diagnoses linked to this encounter.    Additional instructions  Subjective:  Patient presents with venous ulcer of the Bilateral lower extremity.    Procedure:  3 layer unna wrap was placed Bilateral lower extremity.   Plan:   Follow up in one week.  

## 2019-10-18 NOTE — Patient Instructions (Signed)
Visit Information  Goals Addressed            This Visit's Progress     Patient Stated   . PharmD "My leg is still not completely healed" (pt-stated)       Current Barriers:  . Polypharmacy; complex patient with multiple comorbidities including chronic venous insufficiency, COPD, CAD, hx CVA, with recent cellulitis requiring multiple rounds of antibiotics.  . Calls me today for recommendations for probiotics, given hx of significant antibiotic use recently  o ASCVD (hx CVA); ASA 81 mg daily, clopidogrel 75 mg daily; atorvastatin 40 mg daily (last LDL 70);  o COPD/Allergies: Symbicort + Albuterol PRN; plan to evaluate benefit of switching to LAMA/LABA moving forward; montelukast 10 mg daily, loratadine 10 mg daily  o Depression: citalopram 20 mg daily o Post-menopausal symptoms: estradiol 0.1 mg patch twice weekly o GERD: pantoprazole 40 mg dialy  Pharmacist Clinical Goal(s):  Marland Kitchen Over the next 90 days, patient will work with PharmD and provider towards optimized medication management   Interventions: . Discussed recommendations regarding probiotics, and that evidence does not consistently suggest any GI benefit. If patient wants to take a probiotic, discussed some well-recommended brands, and to consider speaking with the pharmacist at the retail pharmacy about their recommendations . Reviewed phone visit w/ RN CM tomorrow . Uncertain if patient needs lifetime DAPT, or if clopidogrel monotherapy is sufficient. Will discuss w/ PCP  Patient Self Care Activities:  . Patient will take medications as prescribed  Please see past updates related to this goal by clicking on the "Past Updates" button in the selected goal         The patient verbalized understanding of instructions provided today and declined a print copy of patient instruction materials.   Plan: - Scheduled telephonic outreach 11/30/19 @ 3 pm  Catie Darnelle Maffucci, PharmD, [place] [phone]

## 2019-10-18 NOTE — Progress Notes (Signed)
Error, duplicate

## 2019-10-19 ENCOUNTER — Other Ambulatory Visit: Payer: Self-pay | Admitting: Nurse Practitioner

## 2019-10-19 ENCOUNTER — Ambulatory Visit (INDEPENDENT_AMBULATORY_CARE_PROVIDER_SITE_OTHER): Payer: Medicare Other

## 2019-10-19 ENCOUNTER — Ambulatory Visit: Payer: Self-pay | Admitting: *Deleted

## 2019-10-19 ENCOUNTER — Other Ambulatory Visit: Payer: Self-pay

## 2019-10-19 DIAGNOSIS — E538 Deficiency of other specified B group vitamins: Secondary | ICD-10-CM

## 2019-10-19 DIAGNOSIS — J449 Chronic obstructive pulmonary disease, unspecified: Secondary | ICD-10-CM

## 2019-10-19 DIAGNOSIS — I2510 Atherosclerotic heart disease of native coronary artery without angina pectoris: Secondary | ICD-10-CM

## 2019-10-19 DIAGNOSIS — I872 Venous insufficiency (chronic) (peripheral): Secondary | ICD-10-CM

## 2019-10-19 MED ORDER — CYANOCOBALAMIN 1000 MCG/ML IJ SOLN
1000.0000 ug | Freq: Once | INTRAMUSCULAR | Status: AC
  Administered 2019-10-19: 1000 ug via INTRAMUSCULAR

## 2019-10-19 NOTE — Chronic Care Management (AMB) (Signed)
Chronic Care Management   Follow Up Note   10/19/2019 Name: Sharon [name] MRN: [id number] DOB: 10-27-1945  Referred by: [name], NP Reason for referral : Chronic Care Management ([place] )   Sharon [name] is a 74 y.o. year old female who is a primary care patient of [name], [name], NP. The CCM team was consulted for assistance with chronic disease management and care coordination needs.    Review of patient status, including review of consultants reports, relevant laboratory and other test results, and collaboration with appropriate care team members and the patient's provider was performed as part of comprehensive patient evaluation and provision of chronic care management services.    SDOH (Social Determinants of Health) screening performed today: Physical Activity. See Care Plan for related entries.   Outpatient Encounter Medications as of 10/19/2019  Medication Sig Note  . albuterol (PROAIR HFA) 108 (90 Base) MCG/ACT inhaler Inhale 2 puffs into the lungs every 6 (six) hours as needed for wheezing or shortness of breath. 03/16/2019: QID during this acute illness, but generally 1-2x/day  . aspirin 81 MG chewable tablet Chew by mouth daily.   Marland Kitchen atorvastatin (LIPITOR) 40 MG tablet TAKE 1 TABLET BY MOUTH ONCE A DAY AT 6 PM   . benzonatate (TESSALON PERLES) 100 MG capsule Take 1 capsule (100 mg total) by mouth 3 (three) times daily as needed for cough.   . budesonide-formoterol (SYMBICORT) 160-4.5 MCG/ACT inhaler USE 2 PUFFS BY MOUTH TWICE DAILY.   . cephALEXin (KEFLEX) 500 MG capsule Take 1 capsule (500 mg total) by mouth 4 (four) times daily.   . citalopram (CELEXA) 20 MG tablet Take 1 tablet (20 mg total) by mouth daily. 05/27/2019: As needed  . clopidogrel (PLAVIX) 75 MG tablet TAKE 1 TABLET(75 MG) BY MOUTH DAILY   . Cyanocobalamin 1000 MCG/ML KIT Inject 1,000 mcg as directed every 30 (thirty) days.   Marland Kitchen estradiol (VIVELLE-DOT) 0.1 MG/24HR patch APPLY 1 PATCH ONTO THE SKIN 2 TIMES A  WEEK   . Loratadine (CLARITIN PO) Take by mouth daily.   . meclizine (ANTIVERT) 12.5 MG tablet Take 1 tablet (12.5 mg total) by mouth 3 (three) times daily as needed for dizziness (only if needed).   . montelukast (SINGULAIR) 10 MG tablet Take 1 tablet (10 mg total) by mouth at bedtime.   . Multiple Vitamin (MULTIVITAMIN) tablet Take 2 tablets by mouth daily.    . pantoprazole (PROTONIX) 40 MG tablet Take 1 tablet (40 mg total) by mouth daily.   . predniSONE (STERAPRED UNI-PAK 21 TAB) 10 MG (21) TBPK tablet Per Packaging Directions   . triamcinolone cream (KENALOG) 0.1 % Apply 1 application topically 2 (two) times daily.    No facility-administered encounter medications on file as of 10/19/2019.     Goals Addressed            This Visit's Progress   . RN- I need to take care of my edema (pt-stated)       Current Barriers:  . Chronic Disease Management support and education needs related to lymph edema  Nurse Case Manager Clinical Goal(s):  Marland Kitchen Over the next 90 days, patient will work with [facility] to address needs related to lymph edema  Interventions:  . Evaluation of current treatment plan related to Bilateral lower extremity edema and patient's adherence to plan as established by provider. . Advised patient to allow herself to go get lymph edema massages as recommended by her vein & vascular specialist. When her cellulitis  has cleared.   . Discussed plans with patient for ongoing care management follow up and provided patient with direct contact information for care management team . Reviewed scheduled/upcoming provider appointments including:  . 1/12 for legs to be wrapped. Patient expresses between she and her spouse they have lots of upcoming appointments. . Patient using lymph edema pumps q day for 1 hour as directed. . Patient unable to exercise much related to leg wraps and discomfort.  . Discussed with patient her aspirin and Plavix. Patient stated she was started on both after her  stroke by cardiologist. Requested CCM pharm D to look into this further.  . Patient using pure coconut oil as a lotion and stated it has been very effective not to cause her skin to be red or irritated.    Patient Self Care Activities:  . Performs ADL's independently . Performs IADL's independently . Does not adhere to provider recommendations re: to obtain lymph edema massages bi-weekly related to time and financial constraints. Patient plans to work on this as a goal.   Please see past updates related to this goal by clicking on the "Past Updates" button in the selected goal          The care management team will reach out to the patient again over the next 60 days.  The patient has been provided with contact information for the care management team and has been advised to call with any health related questions or concerns.    Merlene Morse Skya Mccullum RN, BSN Nurse Case Editor, [facility]  [phone]) Business Mobile

## 2019-10-20 ENCOUNTER — Encounter: Admitting: Occupational Therapy

## 2019-10-20 NOTE — Patient Instructions (Signed)
Thank you allowing the Chronic Care Management Team to be a part of your care! It was a pleasure speaking with you today!  CCM (Chronic Care Management) Team   Rue Valladares RN, BSN Nurse Care Coordinator  [phone]  Catie [facility] PharmD  Clinical Pharmacist  [phone]  [name] LCSW Clinical Social Worker [phone]  Goals Addressed            This Visit's Progress   . RN- I need to take care of my edema (pt-stated)       Current Barriers:  . Chronic Disease Management support and education needs related to lymph edema  Nurse Case Manager Clinical Goal(s):  Marland Kitchen Over the next 90 days, patient will work with [facility] to address needs related to lymph edema  Interventions:  . Evaluation of current treatment plan related to Bilateral lower extremity edema and patient's adherence to plan as established by provider. . Advised patient to allow herself to go get lymph edema massages as recommended by her vein & vascular specialist. When her cellulitis has cleared.   . Discussed plans with patient for ongoing care management follow up and provided patient with direct contact information for care management team . Reviewed scheduled/upcoming provider appointments including:  . 1/12 for legs to be wrapped. Patient expresses between she and her spouse they have lots of upcoming appointments. . Patient using lymph edema pumps q day for 1 hour as directed. . Patient unable to exercise much related to leg wraps and discomfort.  . Discussed with patient her aspirin and Plavix. Patient stated she was started on both after her stroke by cardiologist. Requested CCM pharm D to look into this further.  . Patient using pure coconut oil as a lotion and stated it has been very effective not to cause her skin to be red or irritated.    Patient Self Care Activities:  . Performs ADL's independently . Performs IADL's independently . Does not adhere to provider recommendations re: to obtain  lymph edema massages bi-weekly related to time and financial constraints. Patient plans to work on this as a goal.   Please see past updates related to this goal by clicking on the "Past Updates" button in the selected goal         The patient verbalized understanding of instructions provided today and declined a print copy of patient instruction materials.   The patient has been provided with contact information for the care management team and has been advised to call with any health related questions or concerns.

## 2019-10-21 ENCOUNTER — Ambulatory Visit: Payer: Self-pay | Admitting: Pharmacist

## 2019-10-21 DIAGNOSIS — I2510 Atherosclerotic heart disease of native coronary artery without angina pectoris: Secondary | ICD-10-CM

## 2019-10-21 DIAGNOSIS — J449 Chronic obstructive pulmonary disease, unspecified: Secondary | ICD-10-CM | POA: Diagnosis not present

## 2019-10-21 NOTE — Patient Instructions (Signed)
Visit Information  Goals Addressed            This Visit's Progress     Patient Stated   . PharmD "My leg is still not completely healed" (pt-stated)       Current Barriers:  . Polypharmacy; complex patient with multiple comorbidities including chronic venous insufficiency, COPD, CAD, hx CVA, with recent cellulitis requiring multiple rounds of antibiotics.  . Calls me today for recommendations for probiotics, given hx of significant antibiotic use recently  o ASCVD (hx CVA); ASA 81 mg daily (though only taking 1-2 times weekly), clopidogrel 75 mg daily; atorvastatin 40 mg daily (last LDL 70);  - Extensive chart review. Per visit w/ Dr. Rockey Situ 01/11/2019, patient could d/c ASA d/t more risk of bleeding than potential benefit o COPD/Allergies: Symbicort + Albuterol PRN; plan to evaluate benefit of switching to LAMA/LABA moving forward; montelukast 10 mg daily, loratadine 10 mg daily  o Depression: citalopram 20 mg daily o Post-menopausal symptoms: estradiol 0.1 mg patch twice weekly o GERD: pantoprazole 40 mg dialy  Pharmacist Clinical Goal(s):  Marland Kitchen Over the next 90 days, patient will work with PharmD and provider towards optimized medication management   Interventions: . Contacted patient; reviewed that she does not have indication for DAPT at this time, and that Dr. Rockey Situ had mentioned d/c back in March. Patient verbalized understanding and will discontinue.  Patient Self Care Activities:  . Patient will take medications as prescribed  Please see past updates related to this goal by clicking on the "Past Updates" button in the selected goal         The patient verbalized understanding of instructions provided today and declined a print copy of patient instruction materials.   Plan: - Will outreach patient as previously scheduled  Catie Darnelle Maffucci, PharmD, [place] [phone]

## 2019-10-21 NOTE — Chronic Care Management (AMB) (Signed)
Chronic Care Management   Follow Up Note   10/21/2019 Name: Sharon [name] MRN: [id number] DOB: 01-Jul-1946  Referred by: Sharon Lick, NP Reason for referral : Chronic Care Management (Medication Management )   Sharon [name] is a 74 y.o. year old female who is a primary care patient of [name], Sharon Faster, NP. The CCM team was consulted for assistance with chronic disease management and care coordination needs.    Medication management follow up completed today.   Review of patient status, including review of consultants reports, relevant laboratory and other test results, and collaboration with appropriate care team members and the patient's provider was performed as part of comprehensive patient evaluation and provision of chronic care management services.    SDOH (Social Determinants of Health) screening performed today: None. See Care Plan for related entries.   Outpatient Encounter Medications as of 10/21/2019  Medication Sig Note  . albuterol (PROAIR HFA) 108 (90 Base) MCG/ACT inhaler Inhale 2 puffs into the lungs every 6 (six) hours as needed for wheezing or shortness of breath. 03/16/2019: QID during this acute illness, but generally 1-2x/day  . atorvastatin (LIPITOR) 40 MG tablet TAKE 1 TABLET BY MOUTH ONCE A DAY AT 6 PM   . benzonatate (TESSALON PERLES) 100 MG capsule Take 1 capsule (100 mg total) by mouth 3 (three) times daily as needed for cough.   . budesonide-formoterol (SYMBICORT) 160-4.5 MCG/ACT inhaler USE 2 PUFFS BY MOUTH TWICE DAILY.   . cephALEXin (KEFLEX) 500 MG capsule Take 1 capsule (500 mg total) by mouth 4 (four) times daily.   . citalopram (CELEXA) 20 MG tablet Take 1 tablet (20 mg total) by mouth daily. 05/27/2019: As needed  . clopidogrel (PLAVIX) 75 MG tablet TAKE 1 TABLET(75 MG) BY MOUTH DAILY   . Cyanocobalamin 1000 MCG/ML KIT Inject 1,000 mcg as directed every 30 (thirty) days.   Marland Kitchen estradiol (VIVELLE-DOT) 0.1 MG/24HR patch APPLY 1 PATCH ONTO THE SKIN 2 TIMES A  WEEK   . Loratadine (CLARITIN PO) Take by mouth daily.   . meclizine (ANTIVERT) 12.5 MG tablet Take 1 tablet (12.5 mg total) by mouth 3 (three) times daily as needed for dizziness (only if needed).   . montelukast (SINGULAIR) 10 MG tablet Take 1 tablet (10 mg total) by mouth at bedtime.   . Multiple Vitamin (MULTIVITAMIN) tablet Take 2 tablets by mouth daily.    . pantoprazole (PROTONIX) 40 MG tablet Take 1 tablet (40 mg total) by mouth daily.   . predniSONE (STERAPRED UNI-PAK 21 TAB) 10 MG (21) TBPK tablet Per Packaging Directions   . triamcinolone cream (KENALOG) 0.1 % Apply 1 application topically 2 (two) times daily.   . [DISCONTINUED] aspirin 81 MG chewable tablet Chew by mouth daily.    No facility-administered encounter medications on file as of 10/21/2019.     Goals Addressed            This Visit's Progress     Patient Stated   . PharmD "My leg is still not completely healed" (pt-stated)       Current Barriers:  . Polypharmacy; complex patient with multiple comorbidities including chronic venous insufficiency, COPD, CAD, hx CVA, with recent cellulitis requiring multiple rounds of antibiotics.  . Calls me today for recommendations for probiotics, given hx of significant antibiotic use recently  o ASCVD (hx CVA); ASA 81 mg daily (though only taking 1-2 times weekly), clopidogrel 75 mg daily; atorvastatin 40 mg daily (last LDL 70);  - Extensive chart review.  Per visit w/ Dr. Rockey Situ 01/11/2019, patient could d/c ASA d/t more risk of bleeding than potential benefit o COPD/Allergies: Symbicort + Albuterol PRN; plan to evaluate benefit of switching to LAMA/LABA moving forward; montelukast 10 mg daily, loratadine 10 mg daily  o Depression: citalopram 20 mg daily o Post-menopausal symptoms: estradiol 0.1 mg patch twice weekly o GERD: pantoprazole 40 mg dialy  Pharmacist Clinical Goal(s):  Marland Kitchen Over the next 90 days, patient will work with PharmD and provider towards optimized medication  management   Interventions: . Contacted patient; reviewed that she does not have indication for DAPT at this time, and that Dr. Rockey Situ had mentioned d/c back in March. Patient verbalized understanding and will discontinue.  Patient Self Care Activities:  . Patient will take medications as prescribed  Please see past updates related to this goal by clicking on the "Past Updates" button in the selected goal          Plan: - Will outreach patient as previously scheduled  Sharon [name], PharmD, [place] [phone]

## 2019-10-24 ENCOUNTER — Encounter (INDEPENDENT_AMBULATORY_CARE_PROVIDER_SITE_OTHER): Payer: Self-pay

## 2019-10-25 ENCOUNTER — Encounter: Admitting: Occupational Therapy

## 2019-10-25 ENCOUNTER — Ambulatory Visit (INDEPENDENT_AMBULATORY_CARE_PROVIDER_SITE_OTHER): Payer: Medicare Other | Admitting: Nurse Practitioner

## 2019-10-25 ENCOUNTER — Encounter (INDEPENDENT_AMBULATORY_CARE_PROVIDER_SITE_OTHER): Payer: Self-pay

## 2019-10-25 ENCOUNTER — Other Ambulatory Visit: Payer: Self-pay

## 2019-10-25 ENCOUNTER — Ambulatory Visit: Payer: Medicare Other

## 2019-10-25 VITALS — BP 142/68 | HR 55 | Resp 16 | Wt 221.0 lb

## 2019-10-25 DIAGNOSIS — I890 Lymphedema, not elsewhere classified: Secondary | ICD-10-CM

## 2019-10-25 DIAGNOSIS — S66812D Strain of other specified muscles, fascia and tendons at wrist and hand level, left hand, subsequent encounter: Secondary | ICD-10-CM | POA: Diagnosis not present

## 2019-10-25 DIAGNOSIS — L03116 Cellulitis of left lower limb: Secondary | ICD-10-CM

## 2019-10-25 DIAGNOSIS — I700 Atherosclerosis of aorta: Secondary | ICD-10-CM | POA: Diagnosis not present

## 2019-10-25 MED ORDER — DOXYCYCLINE HYCLATE 100 MG PO CAPS: 100.0000 mg | ORAL_CAPSULE | Freq: Two times a day (BID) | ORAL | 0 refills | Status: DC

## 2019-10-25 MED ORDER — AMOXICILLIN 875 MG PO TABS: 875.0000 mg | ORAL_TABLET | Freq: Two times a day (BID) | ORAL | 0 refills | Status: DC

## 2019-10-25 NOTE — Progress Notes (Signed)
SUBJECTIVE:  Patient ID: Sharon [name], female    DOB: 1946/09/10, 74 y.o.   MRN: [id number] Chief Complaint  Patient presents with  . Follow-up    HPI  Sharon [name] is a 74 y.o. female that presents today for an Unna wrap change initially however there were some concerns for color changes on the patient's legs.  When the patient was initially placed in [place] wraps she had cellulitis of her right lower extremity however today the left lower extremity has some cellulitic changes present.  Both legs still have some erythema but the swelling is much better controlled.  The patient does state that her legs are somewhat sore at times.  Her other complaint and concern is that her toes are sometimes cool and she notices that sometimes it appears as if her feet turn a little bluish when she is in the shower.  The patient does have a known history of neuropathy as well.  Previously the patient had ABIs done a little over a year ago and they showed little peripheral artery disease.  However the patient does have numerous risk factors such as a previous smoking history, and coronary artery disease.  She denies any fever, chills, nausea vomiting or diarrhea.  There is no chest pain or shortness of breath.  Past Medical History:  Diagnosis Date  . Anxiety   . Arthritis    hands, upper back  . Asthma   . COPD (chronic obstructive pulmonary disease) ([place])   . History of cervical cancer   . Menopausal disorder   . Osteoporosis   . Pneumonia 1960  . Spasm of abdominal muscles of right side    intermittent  . TMJ (dislocation of temporomandibular joint)   . Wears dentures    partial lower    Past Surgical History:  Procedure Laterality Date  . ABDOMINAL HYSTERECTOMY  1970's  . bladder botox  2005  . BLADDER SUSPENSION  2004  . CATARACT EXTRACTION W/PHACO Right 03/09/2018   Procedure: CATARACT EXTRACTION PHACO AND INTRAOCULAR LENS PLACEMENT ([facility]) right;  Surgeon: Eulogio Bear, MD;  Location:  [place];  Service: Ophthalmology;  Laterality: Right;  CALL CELL 1ST  . CATARACT EXTRACTION W/PHACO Left 04/19/2018   Procedure: CATARACT EXTRACTION PHACO AND INTRAOCULAR LENS PLACEMENT ([facility])  LEFT;  Surgeon: Eulogio Bear, MD;  Location: [place];  Service: Ophthalmology;  Laterality: Left;  . COLONOSCOPY WITH PROPOFOL N/A 12/13/2015   Procedure: COLONOSCOPY WITH PROPOFOL;  Surgeon: Lucilla Lame, MD;  Location: [place];  Service: Endoscopy;  Laterality: N/A;  . LOOP RECORDER INSERTION N/A 01/07/2018   Procedure: LOOP RECORDER INSERTION;  Surgeon: Deboraha Sprang, MD;  Location: [place] [facility];  Service: Cardiovascular;  Laterality: N/A;  . POLYPECTOMY N/A 12/13/2015   Procedure: POLYPECTOMY;  Surgeon: Lucilla Lame, MD;  Location: [place];  Service: Endoscopy;  Laterality: N/A;  SIGMOID COLON POLYPS X  5  . SHOULDER ARTHROSCOPY W/ ROTATOR CUFF REPAIR Right 1998  . TEE WITHOUT CARDIOVERSION N/A 01/06/2018   Procedure: TRANSESOPHAGEAL ECHOCARDIOGRAM (TEE);  Surgeon: Minna Merritts, MD;  Location: [facility];  Service: Cardiovascular;  Laterality: N/A;  . TONSILLECTOMY AND ADENOIDECTOMY      Social History   Socioeconomic History  . Marital status: Married    Spouse name: Lasheika Ortloff  . Number of children: 1  . Years of education: Not on file  . Highest education level: Some college, no degree  Occupational History  .  Occupation: retired  Tobacco Use  . Smoking status: Former Smoker    Years: 56.00    Types: Cigarettes    Quit date: 07/14/2016    Years since quitting: 3.2  . Smokeless tobacco: Never Used  Substance and Sexual Activity  . Alcohol use: Yes    Alcohol/week: 0.0 standard drinks  . Drug use: No  . Sexual activity: Not Currently    Birth control/protection: Post-menopausal  Other Topics Concern  . 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 Difficulty of Paying Living Expenses: Not on file  Food Insecurity:   . Worried About Charity fundraiser in the Last Year: Not on file  . Ran Out of Food in the Last Year: Not on file  Transportation Needs:   . Lack of Transportation (Medical): Not on file  . Lack of Transportation (Non-Medical): Not on file  Physical Activity:   . Days of Exercise per Week: Not on file  . Minutes of Exercise per Session: Not on file  Stress:   . Feeling of Stress : Not on file  Social Connections:   . Frequency of Communication with Friends and Family: Not on file  . Frequency of Social Gatherings with Friends and Family: Not on file  . Attends Religious Services: Not on file  . Active Member of Clubs or Organizations: Not on file  . Attends Archivist Meetings: Not on file  . Marital Status: Not on file  Intimate Partner Violence:   . Fear of Current or Ex-Partner: Not on file  . Emotionally Abused: Not on file  . Physically Abused: Not on file  . Sexually Abused: Not on file    Family History  Problem Relation Age of Onset  . Diabetes Mother   . Heart disease Mother   . Stroke Mother   . Stroke Maternal Grandmother     Allergies  Allergen Reactions  . Baclofen Swelling  . Bactrim [Sulfamethoxazole-Trimethoprim] Nausea And Vomiting  . Ibuprofen Rash    Mouth swelling  . Latex Rash    Some bandaids, some gloves; BLOOD TEST NEGATIVE  . Librium [Chlordiazepoxide] Itching    Dizziness   . Naprosyn [Naproxen] Rash    Mouth swelling  . Other Rash    [place] nuts - mouth swelling     Review of Systems   Review of Systems: Negative Unless Checked Constitutional: [] Weight loss  [] Fever  [] Chills Cardiac: [] Chest pain   []  Atrial Fibrillation  [] Palpitations   [] Shortness of breath when laying flat   [] Shortness of breath with exertion. [] Shortness of breath at rest Vascular:  [] Pain in legs with walking   [] Pain in legs with standing [] Pain in legs  when laying flat   [] Claudication    [] Pain in feet when laying flat    [] History of DVT   [] Phlebitis   [] Swelling in legs   [] Varicose veins   [] Non-healing ulcers Pulmonary:   [] Uses home oxygen   [] Productive cough   [] Hemoptysis   [] Wheeze  [x] COPD   [x] Asthma Neurologic:  [] Dizziness   [] Seizures  [] Blackouts [] History of stroke   [] History of TIA  [] Aphasia   [] Temporary Blindness   [] Weakness or numbness in arm   [] Weakness or numbness in leg Musculoskeletal:   [] Joint swelling   [] Joint pain   [] Low back pain  []  History of Knee Replacement [] Arthritis [] back  Surgeries  []  Spinal Stenosis    Hematologic:  [] Easy bruising  [] Easy bleeding   [] Hypercoagulable state   [] Anemic Gastrointestinal:  [] Diarrhea   [] Vomiting  [] Gastroesophageal reflux/heartburn   [] Difficulty swallowing. [] Abdominal pain Genitourinary:  [] Chronic kidney disease   [] Difficult urination  [] Anuric   [] Blood in urine [] Frequent urination  [] Burning with urination   [] Hematuria Skin:  [] Rashes   [] Ulcers [] Wounds Psychological:  [x] History of anxiety   []  History of major depression  []  Memory Difficulties      OBJECTIVE:   Physical Exam  BP (!) 142/68 (BP Location: Right Arm)   Pulse (!) 55   Resp 16   Wt 221 lb (100.2 kg)   LMP  (LMP Unknown)   BMI 32.64 kg/m   Gen: WD/WN, NAD Head: /AT, No temporalis wasting.  Ear/Nose/Throat: Hearing grossly intact, nares w/o erythema or drainage Eyes: PER, EOMI, sclera nonicteric.  Neck: Supple, no masses.  No JVD.  Pulmonary:  Good air movement, no use of accessory muscles.  Cardiac: RRR Vascular: minimal edema  Vessel Right Left  Radial Palpable Palpable  Dorsalis Pedis Trace Palpable Trace Palpable   Gastrointestinal: soft, non-distended. No guarding/no peritoneal signs.  Musculoskeletal: M/S 5/5 throughout.  No deformity or atrophy.  Neurologic: Pain and light touch intact in extremities.  Symmetrical.  Speech is fluent. Motor exam as listed  above. Psychiatric: Judgment intact, Mood & affect appropriate for pt's clinical situation. Dermatologic: erythema bilaterally, changes consistent with cellulitis on left Lymph : No Cervical lymphadenopathy, no lichenification or skin changes of chronic lymphedema.       ASSESSMENT AND PLAN:  1. Cellulitis of left lower extremity The patient seems to have recurrent episodes of cellulitis.  Previously she was put on Keflex and prednisone which was helpful during her last episode however it does not seem to have made much of an effect this time.  The patient has severe intolerance of Bactrim so we will not utilize that today.  Instead we will try to utilize doxycycline again with the addition of amoxicillin for empiric therapy.  We will have the patient follow-up in about a week or so to evaluate progression with cellulitis. - amoxicillin (AMOXIL) 875 MG tablet; Take 1 tablet (875 mg total) by mouth 2 (two) times daily.  Dispense: 10 tablet; Refill: 0 - doxycycline (VIBRAMYCIN) 100 MG capsule; Take 1 capsule (100 mg total) by mouth 2 (two) times daily.  Dispense: 20 capsule; Refill: 0  2. Aortic atherosclerosis ([place]) Patient complains of some cold feet in addition to some color changes.  In addition to this the patient seems to have recurrent cellulitis.  Patient previously had studies which had ABIs within normal range however some biphasic waveforms.  Given this I feel it is at least prudent to check ABIs to make sure there have been no major changes.  We will do this at the patient's follow-up visit. - VAS [place] ABI WITH/WO TBI; Future  3. Lymphedema Previously the patient was in bilateral Unna wraps to control her edema however the edema is very well controlled today so we will place her back into her compression socks.  The patient is advised to continue to do things such as elevate and exercise on a daily basis however at this time we will hold off on utilizing her lymphedema pump until we can be  sure that her cellulitis is progressively healing.  Patient is advised that if she begins to swell prior to her next follow-up appointment she should contact  her office so that we can place her back in [place] wraps.   Current Outpatient Medications on File Prior to Visit  Medication Sig Dispense Refill  . albuterol (PROAIR HFA) 108 (90 Base) MCG/ACT inhaler Inhale 2 puffs into the lungs every 6 (six) hours as needed for wheezing or shortness of breath. 8.5 Inhaler 12  . atorvastatin (LIPITOR) 40 MG tablet TAKE 1 TABLET BY MOUTH ONCE A DAY AT 6 PM 90 tablet 3  . benzonatate (TESSALON PERLES) 100 MG capsule Take 1 capsule (100 mg total) by mouth 3 (three) times daily as needed for cough. 45 capsule 0  . budesonide-formoterol (SYMBICORT) 160-4.5 MCG/ACT inhaler USE 2 PUFFS BY MOUTH TWICE DAILY. 10.2 g 6  . cephALEXin (KEFLEX) 500 MG capsule Take 1 capsule (500 mg total) by mouth 4 (four) times daily. 20 capsule 0  . citalopram (CELEXA) 20 MG tablet Take 1 tablet (20 mg total) by mouth daily. 90 tablet 3  . clopidogrel (PLAVIX) 75 MG tablet TAKE 1 TABLET(75 MG) BY MOUTH DAILY 90 tablet 3  . Cyanocobalamin 1000 MCG/ML KIT Inject 1,000 mcg as directed every 30 (thirty) days.    Marland Kitchen estradiol (VIVELLE-DOT) 0.1 MG/24HR patch APPLY 1 PATCH ONTO THE SKIN 2 TIMES A WEEK 8 patch 11  . Loratadine (CLARITIN PO) Take by mouth daily.    . meclizine (ANTIVERT) 12.5 MG tablet Take 1 tablet (12.5 mg total) by mouth 3 (three) times daily as needed for dizziness (only if needed). 30 tablet 0  . montelukast (SINGULAIR) 10 MG tablet Take 1 tablet (10 mg total) by mouth at bedtime. 90 tablet 3  . Multiple Vitamin (MULTIVITAMIN) tablet Take 2 tablets by mouth daily.     . pantoprazole (PROTONIX) 40 MG tablet Take 1 tablet (40 mg total) by mouth daily. 90 tablet 3  . predniSONE (STERAPRED UNI-PAK 21 TAB) 10 MG (21) TBPK tablet Per Packaging Directions 21 tablet 0  . triamcinolone cream (KENALOG) 0.1 % Apply 1 application  topically 2 (two) times daily. 30 g 0   No current facility-administered medications on file prior to visit.    There are no Patient Instructions on file for this visit. No follow-ups on file.   Kris Hartmann, NP  This note was completed with Sales executive.  Any errors are purely unintentional.

## 2019-10-26 ENCOUNTER — Ambulatory Visit (INDEPENDENT_AMBULATORY_CARE_PROVIDER_SITE_OTHER): Payer: Medicare Other | Admitting: *Deleted

## 2019-10-26 DIAGNOSIS — I6310 Cerebral infarction due to embolism of unspecified precerebral artery: Secondary | ICD-10-CM

## 2019-10-26 LAB — CUP PACEART REMOTE DEVICE CHECK
Date Time Interrogation Session: 20210113135343
Implantable Pulse Generator Implant Date: 20190328

## 2019-10-27 ENCOUNTER — Encounter: Admitting: Occupational Therapy

## 2019-10-31 ENCOUNTER — Other Ambulatory Visit (INDEPENDENT_AMBULATORY_CARE_PROVIDER_SITE_OTHER): Payer: Self-pay | Admitting: Nurse Practitioner

## 2019-10-31 ENCOUNTER — Encounter (INDEPENDENT_AMBULATORY_CARE_PROVIDER_SITE_OTHER): Payer: Self-pay

## 2019-10-31 DIAGNOSIS — L03116 Cellulitis of left lower limb: Secondary | ICD-10-CM

## 2019-10-31 MED ORDER — AMOXICILLIN 875 MG PO TABS: 875.0000 mg | ORAL_TABLET | Freq: Two times a day (BID) | ORAL | 0 refills | Status: DC

## 2019-11-01 ENCOUNTER — Encounter: Payer: Self-pay | Admitting: Nurse Practitioner

## 2019-11-01 ENCOUNTER — Encounter: Admitting: Occupational Therapy

## 2019-11-03 ENCOUNTER — Encounter: Admitting: Occupational Therapy

## 2019-11-04 ENCOUNTER — Telehealth: Payer: Self-pay

## 2019-11-08 ENCOUNTER — Encounter: Admitting: Occupational Therapy

## 2019-11-10 ENCOUNTER — Ambulatory Visit (INDEPENDENT_AMBULATORY_CARE_PROVIDER_SITE_OTHER): Payer: Medicare Other

## 2019-11-10 ENCOUNTER — Ambulatory Visit (INDEPENDENT_AMBULATORY_CARE_PROVIDER_SITE_OTHER): Payer: Medicare Other | Admitting: Nurse Practitioner

## 2019-11-10 ENCOUNTER — Encounter: Admitting: Occupational Therapy

## 2019-11-10 ENCOUNTER — Encounter (INDEPENDENT_AMBULATORY_CARE_PROVIDER_SITE_OTHER): Payer: Self-pay | Admitting: Nurse Practitioner

## 2019-11-10 ENCOUNTER — Other Ambulatory Visit: Payer: Self-pay

## 2019-11-10 VITALS — BP 119/66 | HR 50 | Resp 16 | Wt 219.0 lb

## 2019-11-10 DIAGNOSIS — I700 Atherosclerosis of aorta: Secondary | ICD-10-CM

## 2019-11-10 DIAGNOSIS — L03119 Cellulitis of unspecified part of limb: Secondary | ICD-10-CM | POA: Diagnosis not present

## 2019-11-10 DIAGNOSIS — I890 Lymphedema, not elsewhere classified: Secondary | ICD-10-CM

## 2019-11-10 DIAGNOSIS — Z636 Dependent relative needing care at home: Secondary | ICD-10-CM | POA: Diagnosis not present

## 2019-11-10 MED ORDER — PENICILLIN V POTASSIUM 250 MG PO TABS: 250.0000 mg | ORAL_TABLET | Freq: Two times a day (BID) | ORAL | 3 refills | Status: DC

## 2019-11-11 ENCOUNTER — Encounter: Payer: Self-pay | Admitting: Nurse Practitioner

## 2019-11-14 ENCOUNTER — Encounter (INDEPENDENT_AMBULATORY_CARE_PROVIDER_SITE_OTHER): Payer: Self-pay | Admitting: Nurse Practitioner

## 2019-11-14 ENCOUNTER — Encounter: Payer: Medicare Other | Admitting: Occupational Therapy

## 2019-11-14 NOTE — Progress Notes (Signed)
SUBJECTIVE:  Patient ID: Sharon [name], female    DOB: 1946-09-15, 74 y.o.   MRN: [id number] Chief Complaint  Patient presents with  . Follow-up    ultrasound follow up    HPI  Sharon [name] is a 74 y.o. female that presents today for follow-up to evaluate her lower extremities after episode of cellulitis.  The patient has had multiple episodes of cellulitis this year.  There is some concern that there could be a peripheral arterial disease component.  The patient is very diligent with her compression therapy and making sure that she utilizes her lymphedema pump daily, she elevates her lower extremities and wears her compression socks.  She denies any fever, chills nausea or vomiting.  Nothing to suggest systemic spread of cellulitis.  Patient does have some persistent reddish tent however this is not consistent with cellulitis.  Today the patient had bilateral ABIs.  Right ABI 1.01.  Left of 0.99.  This is consistent with the previous study done on 07/16/2018.  She has triphasic tibial artery waveforms bilaterally.  Past Medical History:  Diagnosis Date  . Anxiety   . Arthritis    hands, upper back  . Asthma   . COPD (chronic obstructive pulmonary disease) ([place])   . History of cervical cancer   . Menopausal disorder   . Osteoporosis   . Pneumonia 1960  . Spasm of abdominal muscles of right side    intermittent  . TMJ (dislocation of temporomandibular joint)   . Wears dentures    partial lower    Past Surgical History:  Procedure Laterality Date  . ABDOMINAL HYSTERECTOMY  1970's  . bladder botox  2005  . BLADDER SUSPENSION  2004  . CATARACT EXTRACTION W/PHACO Right 03/09/2018   Procedure: CATARACT EXTRACTION PHACO AND INTRAOCULAR LENS PLACEMENT ([place]) right;  Surgeon: Eulogio Bear, MD;  Location: [place];  Service: Ophthalmology;  Laterality: Right;  CALL CELL 1ST  . CATARACT EXTRACTION W/PHACO Left 04/19/2018   Procedure: CATARACT EXTRACTION PHACO AND  INTRAOCULAR LENS PLACEMENT ([facility])  LEFT;  Surgeon: Eulogio Bear, MD;  Location: [place];  Service: Ophthalmology;  Laterality: Left;  . COLONOSCOPY WITH PROPOFOL N/A 12/13/2015   Procedure: COLONOSCOPY WITH PROPOFOL;  Surgeon: Lucilla Lame, MD;  Location: [place];  Service: Endoscopy;  Laterality: N/A;  . LOOP RECORDER INSERTION N/A 01/07/2018   Procedure: LOOP RECORDER INSERTION;  Surgeon: Deboraha Sprang, MD;  Location: [place] [facility];  Service: Cardiovascular;  Laterality: N/A;  . POLYPECTOMY N/A 12/13/2015   Procedure: POLYPECTOMY;  Surgeon: Lucilla Lame, MD;  Location: [place];  Service: Endoscopy;  Laterality: N/A;  SIGMOID COLON POLYPS X  5  . SHOULDER ARTHROSCOPY W/ ROTATOR CUFF REPAIR Right 1998  . TEE WITHOUT CARDIOVERSION N/A 01/06/2018   Procedure: TRANSESOPHAGEAL ECHOCARDIOGRAM (TEE);  Surgeon: Minna Merritts, MD;  Location: [facility];  Service: Cardiovascular;  Laterality: N/A;  . TONSILLECTOMY AND ADENOIDECTOMY      Social History   Socioeconomic History  . Marital status: Married    Spouse name: Sanika Brosious  . Number of children: 1  . Years of education: Not on file  . Highest education level: Some college, no degree  Occupational History  . Occupation: retired  Tobacco Use  . Smoking status: Former Smoker    Years: 56.00    Types: Cigarettes    Quit date: 07/14/2016    Years since quitting: 3.3  . Smokeless tobacco: Never Used  Substance and Sexual Activity  . Alcohol use: Yes    Alcohol/week: 0.0 standard drinks  . Drug use: No  . Sexual activity: Not Currently    Birth control/protection: Post-menopausal  Other Topics Concern  . 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 Difficulty of Paying Living Expenses: Not on file  Food Insecurity:   . Worried About Charity fundraiser in the Last Year: Not on file  . Ran Out of  Food in the Last Year: Not on file  Transportation Needs:   . Lack of Transportation (Medical): Not on file  . Lack of Transportation (Non-Medical): Not on file  Physical Activity:   . Days of Exercise per Week: Not on file  . Minutes of Exercise per Session: Not on file  Stress:   . Feeling of Stress : Not on file  Social Connections:   . Frequency of Communication with Friends and Family: Not on file  . Frequency of Social Gatherings with Friends and Family: Not on file  . Attends Religious Services: Not on file  . Active Member of Clubs or Organizations: Not on file  . Attends Archivist Meetings: Not on file  . Marital Status: Not on file  Intimate Partner Violence:   . Fear of Current or Ex-Partner: Not on file  . Emotionally Abused: Not on file  . Physically Abused: Not on file  . Sexually Abused: Not on file    Family History  Problem Relation Age of Onset  . Diabetes Mother   . Heart disease Mother   . Stroke Mother   . Stroke Maternal Grandmother     Allergies  Allergen Reactions  . Baclofen Swelling  . Bactrim [Sulfamethoxazole-Trimethoprim] Nausea And Vomiting  . Ibuprofen Rash    Mouth swelling  . Latex Rash    Some bandaids, some gloves; BLOOD TEST NEGATIVE  . Librium [Chlordiazepoxide] Itching    Dizziness   . Naprosyn [Naproxen] Rash    Mouth swelling  . Other Rash    [place] nuts - mouth swelling     Review of Systems   Review of Systems: Negative Unless Checked Constitutional: _0 Weight loss  _1 Fever  _2 Chills Cardiac: _3 Chest pain   _4  Atrial Fibrillation  _5 Palpitations   _6 Shortness of breath when laying flat   _7 Shortness of breath with exertion. _8 Shortness of breath at rest Vascular:  _9 Pain in legs with walking   _10 Pain in legs with standing _11 Pain in legs when laying flat   _12 Claudication    _13 Pain in feet when laying flat    _14 History of DVT   _15 Phlebitis   _16 Swelling in legs   _17 Varicose veins   _18 Non-healing ulcers Pulmonary:    _19 Uses home oxygen   _20 Productive cough   _21 Hemoptysis   _22 Wheeze  _23 COPD   _24 Asthma Neurologic:  _25 Dizziness   _26 Seizures  _27 Blackouts _28 History of stroke   _29 History of TIA  _30 Aphasia   _31 Temporary Blindness   _32 Weakness or numbness in arm   _33 Weakness or numbness in leg Musculoskeletal:   _34 Joint swelling   _35 Joint pain   _36 Low back pain  _37  History of Knee Replacement _38 Arthritis _39 back Surgeries  _40  Spinal Stenosis    Hematologic:  _41 Easy bruising  _42 Easy bleeding   _43 Hypercoagulable state   _44 Anemic Gastrointestinal:  _45 Diarrhea   _46 Vomiting  _47 Gastroesophageal reflux/heartburn   _48 Difficulty swallowing. _49 Abdominal pain Genitourinary:  _50 Chronic kidney  disease   _0 Difficult urination  _1 Anuric   _2 Blood in urine _3 Frequent urination  _4 Burning with urination   _5 Hematuria Skin:  _6 Rashes   _7 Ulcers _8 Wounds Psychological:  _9 History of anxiety   _10  History of major depression  _11  Memory Difficulties      OBJECTIVE:   Physical Exam  BP 119/66   Pulse (!) 50   Resp 16   Wt 219 lb (99.3 kg)   LMP  (LMP Unknown)   BMI 32.34 kg/m   Gen: WD/WN, NAD Head: [place]/AT, No temporalis wasting.  Ear/Nose/Throat: Hearing grossly intact, nares w/o erythema or drainage Eyes: PER, EOMI, sclera nonicteric.  Neck: Supple, no masses.  No JVD.  Pulmonary:  Good air movement, no use of accessory muscles.  Cardiac: RRR Vascular:  1-2+ edema bilaterally Vessel Right Left  Dorsalis Pedis Palpable Palpable  Posterior Tibial Palpable Palpable   Gastrointestinal: soft, non-distended. No guarding/no peritoneal signs.  Musculoskeletal: M/S 5/5 throughout.  No deformity or atrophy.  Neurologic: Pain and light touch intact in extremities.  Symmetrical.  Speech is fluent. Motor exam as listed above. Psychiatric: Judgment intact, Mood & affect appropriate for pt's clinical situation. Dermatologic:  Mild stasis dermatitis.  No evidence of cellulitis        ASSESSMENT AND PLAN:  1. Recurrent  cellulitis of lower extremity Today the patient's cellulitis appears to be mostly resolved.  She does have some redness however given her multiple instances of cellulitis I suspect that she will always have some degree of redness in her bilateral lower extremities.  Because the patient has had multiple episodes of cellulitis, and the fact that the cellulitis usually is somewhat difficult to treat, we will try to do some prophylactic antibiotic therapy to see if we can alleviate episodes of cellulitis.  The patient is agreeable to this and we will try for 3 months to see how this works.  Control of the patient's edema is also important in controlling recurrent cellulitis.  See plan below. - penicillin v potassium (VEETID) 250 MG tablet; Take 1 tablet (250 mg total) by mouth 2 (two) times daily.  Dispense: 60 tablet; Refill: 3  2. Lymphedema Patient is to continue utilization of her lymphedema pump.  She is concerned that the lymphedema pump may be contributing to the cellulitis so we will start out 30 minutes a day and work her way up to see how that helps.  The patient should also begin going to the lymphedema clinic in February and hopefully this will help as well.  Patient will continue to wear her medical grade 1 compression stockings on a daily basis and and elevate as much as possible.  Exercise will also continue to help with control of edema.  3. Caregiver stress I suspect that since the patient is busy helping to take care of her husband she is not elevating as much as she can.  Hopefully the patient will be able to relax more after her husband has some upcoming surgeries.   Current Outpatient Medications on File Prior to Visit  Medication Sig Dispense Refill  . albuterol (PROAIR HFA) 108 (90 Base) MCG/ACT inhaler Inhale 2 puffs into the lungs every 6 (six) hours as needed for wheezing or shortness of breath. 8.5 Inhaler 12  . atorvastatin (LIPITOR) 40 MG tablet TAKE 1 TABLET BY MOUTH ONCE A  DAY AT 6 PM 90 tablet 3  . budesonide-formoterol (SYMBICORT) 160-4.5 MCG/ACT inhaler USE 2 PUFFS BY MOUTH TWICE DAILY. 10.2 g 6  . citalopram (CELEXA)  20 MG tablet Take 1 tablet (20 mg total) by mouth daily. 90 tablet 3  . clopidogrel (PLAVIX) 75 MG tablet TAKE 1 TABLET(75 MG) BY MOUTH DAILY 90 tablet 3  . Cyanocobalamin 1000 MCG/ML KIT Inject 1,000 mcg as directed every 30 (thirty) days.    Marland Kitchen estradiol (VIVELLE-DOT) 0.1 MG/24HR patch APPLY 1 PATCH ONTO THE SKIN 2 TIMES A WEEK 8 patch 11  . meclizine (ANTIVERT) 12.5 MG tablet Take 1 tablet (12.5 mg total) by mouth 3 (three) times daily as needed for dizziness (only if needed). 30 tablet 0  . montelukast (SINGULAIR) 10 MG tablet Take 1 tablet (10 mg total) by mouth at bedtime. 90 tablet 3  . pantoprazole (PROTONIX) 40 MG tablet Take 1 tablet (40 mg total) by mouth daily. 90 tablet 3  . amoxicillin (AMOXIL) 875 MG tablet Take 1 tablet (875 mg total) by mouth 2 (two) times daily. (Patient not taking: Reported on 11/10/2019) 10 tablet 0  . benzonatate (TESSALON PERLES) 100 MG capsule Take 1 capsule (100 mg total) by mouth 3 (three) times daily as needed for cough. (Patient not taking: Reported on 11/10/2019) 45 capsule 0  . cephALEXin (KEFLEX) 500 MG capsule Take 1 capsule (500 mg total) by mouth 4 (four) times daily. (Patient not taking: Reported on 11/10/2019) 20 capsule 0  . Loratadine (CLARITIN PO) Take by mouth daily.    . Multiple Vitamin (MULTIVITAMIN) tablet Take 2 tablets by mouth daily.     . predniSONE (STERAPRED UNI-PAK 21 TAB) 10 MG (21) TBPK tablet Per Packaging Directions (Patient not taking: Reported on 11/10/2019) 21 tablet 0  . triamcinolone cream (KENALOG) 0.1 % Apply 1 application topically 2 (two) times daily. (Patient not taking: Reported on 11/10/2019) 30 g 0   No current facility-administered medications on file prior to visit.    There are no Patient Instructions on file for this visit. No follow-ups on file.   Kris Hartmann,  NP  This note was completed with Sales executive.  Any errors are purely unintentional.

## 2019-11-15 ENCOUNTER — Encounter: Payer: Self-pay | Admitting: Occupational Therapy

## 2019-11-15 ENCOUNTER — Ambulatory Visit: Payer: Medicare Other | Attending: Nurse Practitioner | Admitting: Occupational Therapy

## 2019-11-15 ENCOUNTER — Other Ambulatory Visit: Payer: Self-pay

## 2019-11-15 DIAGNOSIS — I890 Lymphedema, not elsewhere classified: Secondary | ICD-10-CM

## 2019-11-15 NOTE — Therapy (Signed)
[place] MAIN [facility] SERVICES [address] [place], [name], [address] Phone: [phone]   Fax:  [phone]  Occupational Therapy Re-Evaluation Summary and Treatment Note Lymphedema Care Patient Details  Name: JALIAYAH SAHR MRN: [id number] Date of Birth: March 17, 1946 Referring Provider (OT): Eulogio Ditch, NP   Encounter Date: 11/15/2019  OT End of Session - 11/15/19 1245    Visit Number  2    Number of Visits  36    Date for OT Re-Evaluation  12/19/19    Authorization Time Period  09/20/19 ( initial eval) - 12/19/2019    Authorization - Visit Number  2    Authorization - Number of Visits  36    OT Start Time  1010    OT Stop Time  1115    OT Time Calculation (min)  65 min    Activity Tolerance  Patient tolerated treatment well;No increased pain    Behavior During Therapy  WFL for tasks assessed/performed       Past Medical History:  Diagnosis Date  . Anxiety   . Arthritis    hands, upper back  . Asthma   . COPD (chronic obstructive pulmonary disease) ([employer])   . History of cervical cancer   . Menopausal disorder   . Osteoporosis   . Pneumonia 1960  . Spasm of abdominal muscles of right side    intermittent  . TMJ (dislocation of temporomandibular joint)   . Wears dentures    partial lower    Past Surgical History:  Procedure Laterality Date  . ABDOMINAL HYSTERECTOMY  1970's  . bladder botox  2005  . BLADDER SUSPENSION  2004  . CATARACT EXTRACTION W/PHACO Right 03/09/2018   Procedure: CATARACT EXTRACTION PHACO AND INTRAOCULAR LENS PLACEMENT ([place]) right;  Surgeon: Eulogio Bear, MD;  Location: [place];  Service: Ophthalmology;  Laterality: Right;  CALL CELL 1ST  . CATARACT EXTRACTION W/PHACO Left 04/19/2018   Procedure: CATARACT EXTRACTION PHACO AND INTRAOCULAR LENS PLACEMENT (I[facility])  LEFT;  Surgeon: Eulogio Bear, MD;  Location: [place];  Service: Ophthalmology;  Laterality: Left;  . COLONOSCOPY WITH  PROPOFOL N/A 12/13/2015   Procedure: COLONOSCOPY WITH PROPOFOL;  Surgeon: Lucilla Lame, MD;  Location: [place];  Service: Endoscopy;  Laterality: N/A;  . LOOP RECORDER INSERTION N/A 01/07/2018   Procedure: LOOP RECORDER INSERTION;  Surgeon: Deboraha Sprang, MD;  Location: [place] [facility];  Service: Cardiovascular;  Laterality: N/A;  . POLYPECTOMY N/A 12/13/2015   Procedure: POLYPECTOMY;  Surgeon: Lucilla Lame, MD;  Location: [place];  Service: Endoscopy;  Laterality: N/A;  SIGMOID COLON POLYPS X  5  . SHOULDER ARTHROSCOPY W/ ROTATOR CUFF REPAIR Right 1998  . TEE WITHOUT CARDIOVERSION N/A 01/06/2018   Procedure: TRANSESOPHAGEAL ECHOCARDIOGRAM (TEE);  Surgeon: Minna Merritts, MD;  Location: [facility];  Service: Cardiovascular;  Laterality: N/A;  . TONSILLECTOMY AND ADENOIDECTOMY      There were no vitals filed for this visit.  Subjective Assessment - 11/15/19 1020    Subjective   Mrs Laubenstein was last seen for a second OT evaluation for BLE lymphedema on 09/20/19. Unfortunately she was unable to commence lymphedema care due to recurrent episodes of cellulitis. She rweturns today in hopes of resuming her original POC to limit LE progression, to limit infection risk, and to reduce pain, discomfort and swelling in her legs.    Pertinent History  Hx anxiety, COPD, OA, Asthma, hx cervical ca, osteoporosis, hx pneumonia, hx  CVA, hx LLE cellulitis, hx L leg wound, hx falls    Limitations  chronic BLE leg swelling and associated pain, decreased balance, fall risk, decreased standing, walking and sitting tolerance, difficulty w/ lower body bathing and dressing, difficulty fitting LB clothing and shoes, unable to drive, impaired transfers and functional mobility, impaired social participation, impaired ability to perform household chores, cooking, meal prep    Repetition  Increases Symptoms    Special Tests  11/20 dopler negative for DVT and CVI    Patient Stated Goals  reduce leg pain  and swelling so I can do more    Pain Onset  Other (comment)   2019        [facility] OT Assessment - 11/15/19 0001      Assessment   Medical Diagnosis  Moderate, stage II BLE Lymphedea 2/2 multiple orthopedic traumas, hx wounds and recurrent cellulitis, obesity, and long term overload 2/2 contributing CHF and CKD    Referring Provider (OT)  Eulogio Ditch, NP    Hand Dominance  Right    Prior Therapy  Intensive and Management CDT. Has ccl 1 OTS knee highs,  had una boots for several weeks  for recurrent cellulitis when OT was interrupted in 12/20.      Precautions   Precautions  Fall   hx leg wounds, erecurrent cellulitis, Hx lymphorrhea   Precaution Comments  carefully monitor skin due to hx of recurrent cellulitis      Restrictions   Weight Bearing Restrictions  No      Balance Screen   Has the patient fallen in the past 6 months  No    Has the patient had a decrease in activity level because of a fear of falling?   No   Covid restrictions leading to decreased activity level   Is the patient reluctant to leave their home because of a fear of falling?   No      Home  Environment   Additional Comments  Pt is primary caregiver for spouse with CKD on dialysis. Pt has resumed local driving    Lives With  Spouse      Prior Function   Level of Independence  Independent with basic ADLs;Independent with household mobility without device;Independent with community mobility without device;Independent with gait;Needs assistance with homemaking;Needs assistance with transfers    Vocation  Retired    Leisure  family      IADL   Shopping  Shops independently for [employer]  Does personal laundry completely;[place] small items, rinses stockings, etc.;Maintains house alone or with occasional assistance;Performs light daily tasks such as dishwashing, bed making;Needs help with all home maintenance tasks    Meal Prep  Plans, prepares and serves adequate meals  independently    [place] own vehicle    Medication Management  Is responsible for taking medication in correct dosages at correct time    Psychiatrist financial matters independently (budgets, writes checks, pays rent, bills goes to bank), collects and keeps track of income      Mobility   Mobility Status  --   slowed pace     Vision - History   Baseline Vision  Wears glasses all the time      Activity Tolerance   Activity Tolerance  Endurance does not limit participation in activity      Cognition   Overall Cognitive Status  Within Functional Limits for tasks assessed  Observation/Other Assessments   Outcome Measures  BLE comparative limb volumetrics, LLIS, LEFS      Posture/Postural Control   Posture/Postural Control  No significant limitations      Sensation   Light Touch  Appears Intact    Additional Comments  c/o decreased and altered sensation in B feet      Coordination   Gross Motor Movements are Fluid and Coordinated  Yes      AROM   Overall AROM   Within functional limits for tasks performed      Strength   Overall Strength  Within functional limits for tasks performed      Hand Function   Left Hand Gross Grasp  Impaired    Left Hand Lateral Pinch  --   impaired   Left 3 point pinch  --   impaired              OT Treatments/Exercises (OP) - 11/15/19 0001      Manual Therapy   Manual Therapy  Edema management;Manual Lymphatic Drainage (MLD);Compression Bandaging    Manual therapy comments  existing ccl 1 OTS knee highs are in excellent condition.    Manual Lymphatic Drainage (MLD)  MLD ro LLE using short neck, abdominal pathways and functional inguinal LN,. Excellent tolerance.    Compression Bandaging  Max A to don stockings after Rx     due to time constraints and Pt's difficulty reaching feet.             OT Education - 11/15/19 1242    Education Details  Reviewed lymphedema skin precautions.  Discussed signs and symptoms of infection, and discussed pros and cons of prophylactic antibiotic for reducing risk of recurrent cellulitis. Discussed possible role of sequential pneumatic compression device in worsening and discussed importance of NOT using device when infection is suspected.Terence Lux) Educated  Patient    Methods  Explanation;Demonstration    Comprehension  Verbalized understanding;Returned demonstration          OT Long Term Goals - 11/15/19 1646      OT LONG TERM GOAL #1   Title  Pt will demonstrate understanding of lymphedema (LE) precautions / prevention principals, including signs / symptoms of cellulitis infection with modified independence using LE Workbook as printed reference to identify 6 precautions without verbal cues by end of 3rd  OT Rx visit.     Baseline  erbal cues    Time  3    Period  Days    Status  New      OT LONG TERM GOAL #2   Title  Pt will be able to apply knee-length, multi-layer, short stretch compression wraps using correct gradient techniques with moderate  caregiver assistance to achieve optimal limb volume reduction during Intensive Phase CDT, and to return affected limb(s) as closely as possible, to premorbid size and shape.    Baseline  supervision    Time  4    Period  Days    Status  New      OT LONG TERM GOAL #3   Title  Pt to achieve at least  10% limb volume reduction in affected limb(s)  bilaterally during Intensive Phase CDT to control limb swelling, to improve tissue integrity and immune function, to improve ADLs performance and to improve functional mobility/ transfer, and to improve body image and self-esteem.    Baseline  max A    Time  8    Period  Weeks  Status  New      OT LONG TERM GOAL #4   Title  Pt will achieve 100% compliance with daily LE self-care home program components, including daily  skin care, simple self-MLD, gradient compression wraps/ compression garments/devices, and therapeutic exercise with  Mod CG support to ensure optimal Intensive Phase limb volume reduction to expedite compression garment/ device fitting.    Baseline  50% compliant    Time  12    Period  Weeks    Status  New      OT LONG TERM GOAL #5   Title  Pt will use appropriate  assistive devices during LE self-care training to don compression garments/devices with modified independence to ensure optimal LE self-management over time and to limit progression of chronic LE.    Baseline  min A    Time  12    Period  Weeks    Status  New      OT LONG TERM GOAL #6   Title  Pt will retain optimal limb volume reductions achieved during Intensive Phase CDT with no more than 3% volume increase with ongoing CG assistance to limit LE progression and further functional decline.    Baseline  min A    Time  6    Period  Months    Status  New            Plan - 11/15/19 1645    OT Occupational Profile and History  Comprehensive Assessment- Review of records and extensive additional review of physical, cognitive, psychosocial history related to current functional performance    Occupational performance deficits (Please refer to evaluation for details):  ADL's;Leisure;IADL's;Social Participation;Rest and Sleep;Work    Marketing executive / Function / Physical Skills  ADL;Balance;Decreased knowledge of use of DME;Decreased knowledge of precautions;Edema;Flexibility;IADL;Pain;Skin integrity;Mobility    Rehab Potential  Good    Clinical Decision Making  Several treatment options, min-mod task modification necessary    Comorbidities Affecting Occupational Performance:  Presence of comorbidities impacting occupational performance    Comorbidities impacting occupational performance description:  COPD, CHF, CKD, OA, hx anxiety Asthma    OT Frequency  2x / week    OT Duration  12 weeks    OT Treatment/Interventions  Self-care/ADL training;Therapeutic exercise;Energy conservation;Functional Mobility Training;Manual lymph drainage;Scar  mobilization;Therapeutic activities;Coping strategies training;Patient/family education;Compression bandaging;Manual Therapy;DME and/or AE instruction    Plan  Fit with appropriate compression garments PRN. Consider increasing compression frm ccl 1 (20-30 mmHg) back to initially recommended ccl 2 ( 30-40 mmHg. Existing OTS compression socks are inadequate for containing leg swelling    OT Home Exercise Plan  lymphatic pumping ther ex 2 x daily, 2 sets of 10 , seated or supine in bed    Consulted and Agree with Plan of Care  Patient       Patient will benefit from skilled therapeutic intervention in order to improve the following deficits and impairments:   Body Structure / Function / Physical Skills: ADL, Balance, Decreased knowledge of use of DME, Decreased knowledge of precautions, Edema, Flexibility, IADL, Pain, Skin integrity, Mobility       Visit Diagnosis: Lymphedema, not elsewhere classified    Problem List Patient Active Problem List   Diagnosis Date Noted  . Vitamin D deficiency 06/28/2019  . Prediabetes 06/28/2019  . GERD (gastroesophageal reflux disease) 12/07/2018  . Vitamin B12 deficiency 12/05/2018  . Senile purpura ([place]) 07/28/2018  . Chronic venous insufficiency 07/14/2018  . Lymphedema 07/14/2018  . Aneurysm of descending thoracic aorta (  [place]) 05/04/2018  . Allergic rhinitis 02/16/2018  . Aortic atherosclerosis ([place]) 01/13/2018  . Status post placement of implantable loop recorder 01/12/2018  . Chronic kidney disease, stage 3 01/12/2018  . Cerebrovascular accident (CVA) due to embolism of precerebral artery ([place])   . TIA (transient ischemic attack) 01/04/2018  . Osteoarthritis of wrist 12/09/2017  . CAD (coronary artery disease) 11/25/2017  . Caregiver stress 11/25/2017  . Menopause 08/14/2017  . De Quervain's tenosynovitis, right 07/17/2017  . Stress incontinence 06/19/2017  . Personal history of tobacco use, presenting hazards to health 04/01/2017  .  Anxiety 03/13/2017  . Glossitis 11/14/2016  . Advanced care planning/counseling discussion 10/17/2016  . Benign neoplasm of sigmoid colon   . COPD (chronic obstructive pulmonary disease) ([place]) 10/17/2015  . Mass of upper inner quadrant of right breast 06/22/2015    Andrey Spearman, MS, OTR/L, [facility] 11/15/19 4:48 PM  [place] MAIN [facility] SERVICES [address] [place], [name], [address] Phone: [phone]   Fax:  [phone]  Name: KAROLINA OCEAN MRN: [id number] Date of Birth: [birth year]

## 2019-11-16 ENCOUNTER — Encounter: Payer: Medicare Other | Admitting: Occupational Therapy

## 2019-11-17 ENCOUNTER — Ambulatory Visit: Payer: Medicare Other | Admitting: Occupational Therapy

## 2019-11-17 ENCOUNTER — Other Ambulatory Visit: Payer: Self-pay

## 2019-11-17 DIAGNOSIS — I890 Lymphedema, not elsewhere classified: Secondary | ICD-10-CM

## 2019-11-18 ENCOUNTER — Ambulatory Visit (INDEPENDENT_AMBULATORY_CARE_PROVIDER_SITE_OTHER): Payer: Medicare Other

## 2019-11-18 ENCOUNTER — Other Ambulatory Visit: Payer: Self-pay

## 2019-11-18 DIAGNOSIS — E538 Deficiency of other specified B group vitamins: Secondary | ICD-10-CM

## 2019-11-18 MED ORDER — CYANOCOBALAMIN 1000 MCG/ML IJ SOLN
1000.0000 ug | Freq: Once | INTRAMUSCULAR | Status: AC
  Administered 2019-11-18: 1000 ug via INTRAMUSCULAR

## 2019-11-18 NOTE — Therapy (Signed)
[place] MAIN [facility] SERVICES [address] [place], [name], [address] Phone: [phone]   Fax:  [phone]  Occupational Therapy Treatment  Patient Details  Name: Sharon [name] MRN: [id number] Date of Birth: 26-Sep-1946 Referring Provider (OT): Eulogio Ditch, NP   Encounter Date: 11/17/2019  OT End of Session - 11/17/19 1217    Visit Number  3    Number of Visits  36    Date for OT Re-Evaluation  02/12/20    Authorization Time Period  09/20/19 ( initial eval) - 12/19/2019    Authorization - Visit Number  2    Authorization - Number of Visits  17    OT Start Time  0103    OT Stop Time  0220    OT Time Calculation (min)  77 min    Activity Tolerance  Patient tolerated treatment well;No increased pain    Behavior During Therapy  WFL for tasks assessed/performed       Past Medical History:  Diagnosis Date  . Anxiety   . Arthritis    hands, upper back  . Asthma   . COPD (chronic obstructive pulmonary disease) ([place])   . History of cervical cancer   . Menopausal disorder   . Osteoporosis   . Pneumonia 1960  . Spasm of abdominal muscles of right side    intermittent  . TMJ (dislocation of temporomandibular joint)   . Wears dentures    partial lower    Past Surgical History:  Procedure Laterality Date  . ABDOMINAL HYSTERECTOMY  1970's  . bladder botox  2005  . BLADDER SUSPENSION  2004  . CATARACT EXTRACTION W/PHACO Right 03/09/2018   Procedure: CATARACT EXTRACTION PHACO AND INTRAOCULAR LENS PLACEMENT ([place]) right;  Surgeon: Eulogio Bear, MD;  Location: [place];  Service: Ophthalmology;  Laterality: Right;  CALL CELL 1ST  . CATARACT EXTRACTION W/PHACO Left 04/19/2018   Procedure: CATARACT EXTRACTION PHACO AND INTRAOCULAR LENS PLACEMENT ([facility])  LEFT;  Surgeon: Eulogio Bear, MD;  Location: [place];  Service: Ophthalmology;  Laterality: Left;  . COLONOSCOPY WITH PROPOFOL N/A 12/13/2015   Procedure:  COLONOSCOPY WITH PROPOFOL;  Surgeon: Lucilla Lame, MD;  Location: [place];  Service: Endoscopy;  Laterality: N/A;  . LOOP RECORDER INSERTION N/A 01/07/2018   Procedure: LOOP RECORDER INSERTION;  Surgeon: Deboraha Sprang, MD;  Location: [place] [facility];  Service: Cardiovascular;  Laterality: N/A;  . POLYPECTOMY N/A 12/13/2015   Procedure: POLYPECTOMY;  Surgeon: Lucilla Lame, MD;  Location: [place];  Service: Endoscopy;  Laterality: N/A;  SIGMOID COLON POLYPS X  5  . SHOULDER ARTHROSCOPY W/ ROTATOR CUFF REPAIR Right 1998  . TEE WITHOUT CARDIOVERSION N/A 01/06/2018   Procedure: TRANSESOPHAGEAL ECHOCARDIOGRAM (TEE);  Surgeon: Minna Merritts, MD;  Location: [facility];  Service: Cardiovascular;  Laterality: N/A;  . TONSILLECTOMY AND ADENOIDECTOMY      There were no vitals filed for this visit.  Subjective Assessment - 11/17/19 1307    Subjective   [name] for OT visit 2 of 36 to address BLE Lymphedema. Pt c/o of pain in R leg, rated at 4/10. "I got down in the floor this morning and couldn;t get up."    Pertinent History  Hx anxiety, COPD, OA, Asthma, hx cervical ca, osteoporosis, hx pneumonia, hx CVA, hx LLE cellulitis, hx L leg wound, hx falls    Limitations  chronic BLE leg swelling and associated pain, decreased balance, fall risk, decreased  standing, walking and sitting tolerance, difficulty w/ lower body bathing and dressing, difficulty fitting LB clothing and shoes, unable to drive, impaired transfers and functional mobility, impaired social participation, impaired ability to perform household chores, cooking, meal prep    Repetition  Increases Symptoms    Special Tests  11/20 dopler negative for DVT and CVI    Patient Stated Goals  reduce leg pain and swelling so I can do more    Pain Onset  Other (comment)   2019                  OT Treatments/Exercises (OP) - 11/18/19 0001      ADLs   ADL Education Given  Yes      Manual Therapy    Manual Therapy  Edema management;Manual Lymphatic Drainage (MLD);Compression Bandaging    Manual Lymphatic Drainage (MLD)  MLD ro RLE using short neck, abdominal pathways and functional inguinal LN,. Excellent tolerance.    Compression Bandaging  Max A to don stockings after Rx     due to time constraints and Pt's difficulty reaching feet.             OT Education - 11/17/19 1013    Education Details  Reviewed lymphedema skin precautions. Discussed signs and symptoms of infection, and discussed pros and cons of prophylactic antibiotic for reducing risk of recurrent cellulitis. Discussed possible role of sequential pneumatic compression device in worsening and discussed importance of NOT using device when infection is suspected.Terence Lux) Educated  Patient    Methods  Explanation;Demonstration    Comprehension  Verbalized understanding;Returned demonstration          OT Long Term Goals - 11/15/19 1646      OT LONG TERM GOAL #1   Title  Pt will demonstrate understanding of lymphedema (LE) precautions / prevention principals, including signs / symptoms of cellulitis infection with modified independence using LE Workbook as printed reference to identify 6 precautions without verbal cues by end of 3rd  OT Rx visit.     Baseline  erbal cues    Time  3    Period  Days    Status  New      OT LONG TERM GOAL #2   Title  Pt will be able to apply knee-length, multi-layer, short stretch compression wraps using correct gradient techniques with moderate  caregiver assistance to achieve optimal limb volume reduction during Intensive Phase CDT, and to return affected limb(s) as closely as possible, to premorbid size and shape.    Baseline  supervision    Time  4    Period  Days    Status  New      OT LONG TERM GOAL #3   Title  Pt to achieve at least  10% limb volume reduction in affected limb(s)  bilaterally during Intensive Phase CDT to control limb swelling, to improve tissue integrity  and immune function, to improve ADLs performance and to improve functional mobility/ transfer, and to improve body image and self-esteem.    Baseline  max A    Time  8    Period  Weeks    Status  New      OT LONG TERM GOAL #4   Title  Pt will achieve 100% compliance with daily LE self-care home program components, including daily  skin care, simple self-MLD, gradient compression wraps/ compression garments/devices, and therapeutic exercise with Mod CG support to ensure optimal Intensive Phase limb volume reduction to  expedite compression garment/ device fitting.    Baseline  50% compliant    Time  12    Period  Weeks    Status  New      OT LONG TERM GOAL #5   Title  Pt will use appropriate  assistive devices during LE self-care training to don compression garments/devices with modified independence to ensure optimal LE self-management over time and to limit progression of chronic LE.    Baseline  min A    Time  12    Period  Weeks    Status  New      OT LONG TERM GOAL #6   Title  Pt will retain optimal limb volume reductions achieved during Intensive Phase CDT with no more than 3% volume increase with ongoing CG assistance to limit LE progression and further functional decline.    Baseline  min A    Time  6    Period  Months    Status  New            Plan - 11/18/19 1218    Clinical Impression Statement  Pt tolerated all aspects of manual therapy for lymphedema care. She is able to don her compression garments after session given extra time. Pt expressed concern repeatedly throughout session re skin redness below the knee. Redness and stasis dermatitis do not appear 2/2 cellulitis infection today. OT reassured Pt, but also stressed the importance of minitoring for infection as she remains at elevated risk due to recent recurrent episodes. Cont as per POC.       Patient will benefit from skilled therapeutic intervention in order to improve the following deficits and impairments:            Visit Diagnosis: Lymphedema, not elsewhere classified    Problem List Patient Active Problem List   Diagnosis Date Noted  . Vitamin D deficiency 06/28/2019  . Prediabetes 06/28/2019  . GERD (gastroesophageal reflux disease) 12/07/2018  . Vitamin B12 deficiency 12/05/2018  . Senile purpura ([place]) 07/28/2018  . Chronic venous insufficiency 07/14/2018  . Lymphedema 07/14/2018  . Aneurysm of descending thoracic aorta ([facility]) 05/04/2018  . Allergic rhinitis 02/16/2018  . Aortic atherosclerosis ([place]) 01/13/2018  . Status post placement of implantable loop recorder 01/12/2018  . Chronic kidney disease, stage 3 01/12/2018  . Cerebrovascular accident (CVA) due to embolism of precerebral artery ([place])   . TIA (transient ischemic attack) 01/04/2018  . Osteoarthritis of wrist 12/09/2017  . CAD (coronary artery disease) 11/25/2017  . Caregiver stress 11/25/2017  . Menopause 08/14/2017  . De Quervain's tenosynovitis, right 07/17/2017  . Stress incontinence 06/19/2017  . Personal history of tobacco use, presenting hazards to health 04/01/2017  . Anxiety 03/13/2017  . Glossitis 11/14/2016  . Advanced care planning/counseling discussion 10/17/2016  . Benign neoplasm of sigmoid colon   . COPD (chronic obstructive pulmonary disease) ([place]) 10/17/2015  . Mass of upper inner quadrant of right breast 06/22/2015    Andrey Spearman, MS, OTR/L, [facility] 11/18/19 12:22 PM  [place] MAIN [facility] SERVICES [address] [place], [name], [address] Phone: [phone]   Fax:  [phone]  Name: Sharon [name] MRN: [id number] Date of Birth: 07/27/1946

## 2019-11-21 ENCOUNTER — Encounter: Payer: Medicare Other | Admitting: Occupational Therapy

## 2019-11-22 ENCOUNTER — Ambulatory Visit: Payer: Medicare Other | Admitting: Occupational Therapy

## 2019-11-22 ENCOUNTER — Other Ambulatory Visit: Payer: Self-pay

## 2019-11-22 DIAGNOSIS — I890 Lymphedema, not elsewhere classified: Secondary | ICD-10-CM

## 2019-11-22 NOTE — Therapy (Signed)
[place] MAIN [facility] SERVICES [address] [place], [name], [address] Phone: [phone]   Fax:  [phone]  Occupational Therapy Treatment  Patient Details  Name: Sharon [name] MRN: [id number] Date of Birth: 16-May-1946 Referring Provider (OT): Eulogio Ditch, NP   Encounter Date: 11/22/2019  OT End of Session - 11/22/19 1119    Visit Number  4    Number of Visits  36    Date for OT Re-Evaluation  02/12/20    Authorization Time Period  09/20/19 ( initial eval) - 12/19/2019    Authorization - Visit Number  2    Authorization - Number of Visits  36    OT Start Time  1000    OT Stop Time  1105    OT Time Calculation (min)  65 min    Activity Tolerance  Patient tolerated treatment well;No increased pain    Behavior During Therapy  WFL for tasks assessed/performed       Past Medical History:  Diagnosis Date  . Anxiety   . Arthritis    hands, upper back  . Asthma   . COPD (chronic obstructive pulmonary disease) ([place])   . History of cervical cancer   . Menopausal disorder   . Osteoporosis   . Pneumonia 1960  . Spasm of abdominal muscles of right side    intermittent  . TMJ (dislocation of temporomandibular joint)   . Wears dentures    partial lower    Past Surgical History:  Procedure Laterality Date  . ABDOMINAL HYSTERECTOMY  1970's  . bladder botox  2005  . BLADDER SUSPENSION  2004  . CATARACT EXTRACTION W/PHACO Right 03/09/2018   Procedure: CATARACT EXTRACTION PHACO AND INTRAOCULAR LENS PLACEMENT ([place]) right;  Surgeon: Eulogio Bear, MD;  Location: [place];  Service: Ophthalmology;  Laterality: Right;  CALL CELL 1ST  . CATARACT EXTRACTION W/PHACO Left 04/19/2018   Procedure: CATARACT EXTRACTION PHACO AND INTRAOCULAR LENS PLACEMENT (IOC)  LEFT;  Surgeon: Eulogio Bear, MD;  Location: [place];  Service: Ophthalmology;  Laterality: Left;  . COLONOSCOPY WITH PROPOFOL N/A 12/13/2015   Procedure:  COLONOSCOPY WITH PROPOFOL;  Surgeon: Lucilla Lame, MD;  Location: [place];  Service: Endoscopy;  Laterality: N/A;  . LOOP RECORDER INSERTION N/A 01/07/2018   Procedure: LOOP RECORDER INSERTION;  Surgeon: Deboraha Sprang, MD;  Location: [place] [facility];  Service: Cardiovascular;  Laterality: N/A;  . POLYPECTOMY N/A 12/13/2015   Procedure: POLYPECTOMY;  Surgeon: Lucilla Lame, MD;  Location: [place];  Service: Endoscopy;  Laterality: N/A;  SIGMOID COLON POLYPS X  5  . SHOULDER ARTHROSCOPY W/ ROTATOR CUFF REPAIR Right 1998  . TEE WITHOUT CARDIOVERSION N/A 01/06/2018   Procedure: TRANSESOPHAGEAL ECHOCARDIOGRAM (TEE);  Surgeon: Minna Merritts, MD;  Location: [facility];  Service: Cardiovascular;  Laterality: N/A;  . TONSILLECTOMY AND ADENOIDECTOMY      There were no vitals filed for this visit.  Subjective Assessment - 11/22/19 8786    Subjective   Sharon [name] for OT visit 4of 36 to address BLE Lymphedema. Pt c/o of  4/10 pain in R leg,  Pt presents without compression knee highs in place. She states, "I rteally need to get some new stockings. Mine are wearing out."    Pertinent History  Hx anxiety, COPD, OA, Asthma, hx cervical ca, osteoporosis, hx pneumonia, hx CVA, hx LLE cellulitis, hx L leg wound, hx falls    Limitations  chronic BLE leg  swelling and associated pain, decreased balance, fall risk, decreased standing, walking and sitting tolerance, difficulty w/ lower body bathing and dressing, difficulty fitting LB clothing and shoes, unable to drive, impaired transfers and functional mobility, impaired social participation, impaired ability to perform household chores, cooking, meal prep    Repetition  Increases Symptoms    Special Tests  11/20 dopler negative for DVT and CVI    Patient Stated Goals  reduce leg pain and swelling so I can do more    Pain Score  4     Pain Location  Leg    Pain Orientation  Right    Pain Descriptors / Indicators  Stabbing;Throbbing     Pain Onset  Other (comment)   2019   Pain Frequency  Intermittent                   OT Treatments/Exercises (OP) - 11/22/19 0001      ADLs   ADL Education Given  Yes      Manual Therapy   Manual Therapy  Edema management;Manual Lymphatic Drainage (MLD);Compression Bandaging    Manual therapy comments  skin care w/ low ph castor oil throughout MLD to increase skin hydration and tissue mobuility/flexibility.    Edema Management  fibrosis techniques to distal leg and ankle    Manual Lymphatic Drainage (MLD)  MLD ro RLE using short neck, abdominal pathways and functional inguinal LN,. Excellent tolerance.    Compression Bandaging  Max A to don stockings after Rx     due to time constraints and Pt's difficulty reaching feet.             OT Education - 11/22/19 0954    Education Details  Continued skilled Pt/caregiver education  And LE ADL training throughout visit for lymphedema self care/ home program, including compression wrapping, compression garment and device wear/care, lymphatic pumping ther ex, simple self-MLD, and skin care. Discussed progress towards goals.    Person(s) Educated  Patient    Methods  Explanation;Demonstration    Comprehension  Verbalized understanding;Returned demonstration          OT Long Term Goals - 11/22/19 0955      OT LONG TERM GOAL #1   Title  Pt will demonstrate understanding of lymphedema (LE) precautions / prevention principals, including signs / symptoms of cellulitis infection with modified independence using LE Workbook as printed reference to identify 6 precautions without verbal cues by end of 3rd  OT Rx visit.     Baseline  erbal cues    Time  3    Period  Days    Status  Achieved      OT LONG TERM GOAL #2   Title  Pt will be able todon and doff existing compression garments independently and without assistive devices and/ or extra time for optimal lymphedema self management over time and to limit infection risk.     Baseline  supervision    Time  4    Period  Days    Status  Achieved      OT LONG TERM GOAL #3   Title  Pt to achieve at least  10% limb volume reduction in affected limb(s)  bilaterally during Intensive Phase CDT to control limb swelling, to improve tissue integrity and immune function, to improve ADLs performance and to improve functional mobility/ transfer, and to improve body image and self-esteem.    Baseline  max A    Time  8    Period  Weeks    Status  New      OT LONG TERM GOAL #4   Title  Pt will achieve 100% compliance with daily LE self-care home program components, including daily  skin care, simple self-MLD, gradient compression wraps/ compression garments/devices, and therapeutic exercise with Mod CG support to ensure optimal Intensive Phase limb volume reduction to expedite compression garment/ device fitting.    Baseline  50% compliant    Time  12    Period  Weeks    Status  Partially Met      OT LONG TERM GOAL #5   Title  Pt will use appropriate  assistive devices during LE self-care training to don compression garments/devices with modified independence to ensure optimal LE self-management over time and to limit progression of chronic LE.    Baseline  min A    Time  12    Period  Weeks    Status  Deferred   DC goal     OT LONG TERM GOAL #6   Title  Pt will retain optimal limb volume reductions achieved during Intensive Phase CDT with no more than 3% volume increase with ongoing CG assistance to limit LE progression and further functional decline.    Baseline  min A    Time  6    Period  Months    Status  New            Plan - 11/22/19 1123    Clinical Impression Statement  Emphasis of MLD on lR eg below the nkee today. Pt tolerated deeper fibrosis techniques without increased pain. Fibrosis slightly softened at distal leg, but ankle unchanged after 60 minutes of manual therapy. Pt donned compression garments independently after session. Goal met. Cont as  per POC.       Patient will benefit from skilled therapeutic intervention in order to improve the following deficits and impairments:           Visit Diagnosis: Lymphedema, not elsewhere classified    Problem List Patient Active Problem List   Diagnosis Date Noted  . Vitamin D deficiency 06/28/2019  . Prediabetes 06/28/2019  . GERD (gastroesophageal reflux disease) 12/07/2018  . Vitamin B12 deficiency 12/05/2018  . Senile purpura ([place]) 07/28/2018  . Chronic venous insufficiency 07/14/2018  . Lymphedema 07/14/2018  . Aneurysm of descending thoracic aorta (HCC) 05/04/2018  . Allergic rhinitis 02/16/2018  . Aortic atherosclerosis ([place]) 01/13/2018  . Status post placement of implantable loop recorder 01/12/2018  . Chronic kidney disease, stage 3 01/12/2018  . Cerebrovascular accident (CVA) due to embolism of precerebral artery ([place])   . TIA (transient ischemic attack) 01/04/2018  . Osteoarthritis of wrist 12/09/2017  . CAD (coronary artery disease) 11/25/2017  . Caregiver stress 11/25/2017  . Menopause 08/14/2017  . De Quervain's tenosynovitis, right 07/17/2017  . Stress incontinence 06/19/2017  . Personal history of tobacco use, presenting hazards to health 04/01/2017  . Anxiety 03/13/2017  . Glossitis 11/14/2016  . Advanced care planning/counseling discussion 10/17/2016  . Benign neoplasm of sigmoid colon   . COPD (chronic obstructive pulmonary disease) ([place]) 10/17/2015  . Mass of upper inner quadrant of right breast 06/22/2015    Andrey Spearman, MS, OTR/L, [facility] 11/22/19 11:26 AM   [place] MAIN [facility] SERVICES [address] [place], [name], [address] Phone: [phone]   Fax:  [phone]  Name: Sharon [name] MRN: [id number] Date of Birth: 06/05/1946

## 2019-11-23 ENCOUNTER — Encounter: Payer: Medicare Other | Admitting: Occupational Therapy

## 2019-11-24 ENCOUNTER — Ambulatory Visit: Payer: Medicare Other | Admitting: Occupational Therapy

## 2019-11-24 ENCOUNTER — Other Ambulatory Visit: Payer: Self-pay

## 2019-11-24 DIAGNOSIS — I890 Lymphedema, not elsewhere classified: Secondary | ICD-10-CM

## 2019-11-24 NOTE — Therapy (Signed)
[place] MAIN [facility] SERVICES [address] [place], [name], [address] Phone: [phone]   Fax:  [phone]  Occupational Therapy Treatment  Patient Details  Name: Sharon [name] MRN: [id number] Date of Birth: Mar 25, 1946 Referring Provider (OT): Eulogio Ditch, NP   Encounter Date: 11/24/2019  OT End of Session - 11/24/19 1254    Visit Number  5    Number of Visits  57    OT Start Time  1110    OT Stop Time  4034    OT Time Calculation (min)  55 min       Past Medical History:  Diagnosis Date  . Anxiety   . Arthritis    hands, upper back  . Asthma   . COPD (chronic obstructive pulmonary disease) ([place])   . History of cervical cancer   . Menopausal disorder   . Osteoporosis   . Pneumonia 1960  . Spasm of abdominal muscles of right side    intermittent  . TMJ (dislocation of temporomandibular joint)   . Wears dentures    partial lower    Past Surgical History:  Procedure Laterality Date  . ABDOMINAL HYSTERECTOMY  1970's  . bladder botox  2005  . BLADDER SUSPENSION  2004  . CATARACT EXTRACTION W/PHACO Right 03/09/2018   Procedure: CATARACT EXTRACTION PHACO AND INTRAOCULAR LENS PLACEMENT ([place]) right;  Surgeon: Eulogio Bear, MD;  Location: [place];  Service: Ophthalmology;  Laterality: Right;  CALL CELL 1ST  . CATARACT EXTRACTION W/PHACO Left 04/19/2018   Procedure: CATARACT EXTRACTION PHACO AND INTRAOCULAR LENS PLACEMENT (IOC)  LEFT;  Surgeon: Eulogio Bear, MD;  Location: [place];  Service: Ophthalmology;  Laterality: Left;  . COLONOSCOPY WITH PROPOFOL N/A 12/13/2015   Procedure: COLONOSCOPY WITH PROPOFOL;  Surgeon: Lucilla Lame, MD;  Location: [place];  Service: Endoscopy;  Laterality: N/A;  . LOOP RECORDER INSERTION N/A 01/07/2018   Procedure: LOOP RECORDER INSERTION;  Surgeon: Deboraha Sprang, MD;  Location: [place] [facility];  Service: Cardiovascular;  Laterality: N/A;  . POLYPECTOMY  N/A 12/13/2015   Procedure: POLYPECTOMY;  Surgeon: Lucilla Lame, MD;  Location: [place];  Service: Endoscopy;  Laterality: N/A;  SIGMOID COLON POLYPS X  5  . SHOULDER ARTHROSCOPY W/ ROTATOR CUFF REPAIR Right 1998  . TEE WITHOUT CARDIOVERSION N/A 01/06/2018   Procedure: TRANSESOPHAGEAL ECHOCARDIOGRAM (TEE);  Surgeon: Minna Merritts, MD;  Location: [facility];  Service: Cardiovascular;  Laterality: N/A;  . TONSILLECTOMY AND ADENOIDECTOMY      There were no vitals filed for this visit.  Subjective Assessment - 11/24/19 1254    Subjective   Sharon [name] for OT visit 5of 36 to address BLE Lymphedema. Pt brings compression stockings to clinic`. Pt complains of discomfort behind L knee. No numerical rationg.    Pertinent History  Hx anxiety, COPD, OA, Asthma, hx cervical ca, osteoporosis, hx pneumonia, hx CVA, hx LLE cellulitis, hx L leg wound, hx falls    Limitations  chronic BLE leg swelling and associated pain, decreased balance, fall risk, decreased standing, walking and sitting tolerance, difficulty w/ lower body bathing and dressing, difficulty fitting LB clothing and shoes, unable to drive, impaired transfers and functional mobility, impaired social participation, impaired ability to perform household chores, cooking, meal prep    Repetition  Increases Symptoms    Special Tests  11/20 dopler negative for DVT and CVI    Patient Stated Goals  reduce leg pain and swelling so  I can do more    Pain Onset  Other (comment)   2019                  OT Treatments/Exercises (OP) - 11/24/19 0001      ADLs   ADL Education Given  Yes      Manual Therapy   Manual Therapy  Edema management;Manual Lymphatic Drainage (MLD);Compression Bandaging    Edema Management  fibrosis techniques to distal leg and ankle    Manual Lymphatic Drainage (MLD)  MLD ro RLE using short neck, abdominal pathways and functional inguinal LN,. Excellent tolerance.    Compression Bandaging  Max A to  don stockings after Rx     due to time constraints and Pt's difficulty reaching feet.             OT Education - 11/24/19 1257    Education Details  Continued skilled Pt/caregiver education  And LE ADL training throughout visit for lymphedema self care/ home program, including compression wrapping, compression garment and device wear/care, lymphatic pumping ther ex, simple self-MLD, and skin care. Discussed progress towards goals.    Person(s) Educated  Patient    Methods  Explanation;Demonstration    Comprehension  Verbalized understanding;Returned demonstration          OT Long Term Goals - 11/22/19 0955      OT LONG TERM GOAL #1   Title  Pt will demonstrate understanding of lymphedema (LE) precautions / prevention principals, including signs / symptoms of cellulitis infection with modified independence using LE Workbook as printed reference to identify 6 precautions without verbal cues by end of 3rd  OT Rx visit.     Baseline  erbal cues    Time  3    Period  Days    Status  Achieved      OT LONG TERM GOAL #2   Title  Pt will be able todon and doff existing compression garments independently and without assistive devices and/ or extra time for optimal lymphedema self management over time and to limit infection risk.    Baseline  supervision    Time  4    Period  Days    Status  Achieved      OT LONG TERM GOAL #3   Title  Pt to achieve at least  10% limb volume reduction in affected limb(s)  bilaterally during Intensive Phase CDT to control limb swelling, to improve tissue integrity and immune function, to improve ADLs performance and to improve functional mobility/ transfer, and to improve body image and self-esteem.    Baseline  max A    Time  8    Period  Weeks    Status  New      OT LONG TERM GOAL #4   Title  Pt will achieve 100% compliance with daily LE self-care home program components, including daily  skin care, simple self-MLD, gradient compression wraps/  compression garments/devices, and therapeutic exercise with Mod CG support to ensure optimal Intensive Phase limb volume reduction to expedite compression garment/ device fitting.    Baseline  50% compliant    Time  12    Period  Weeks    Status  Partially Met      OT LONG TERM GOAL #5   Title  Pt will use appropriate  assistive devices during LE self-care training to don compression garments/devices with modified independence to ensure optimal LE self-management over time and to limit progression of chronic LE.  Baseline  min A    Time  12    Period  Weeks    Status  Deferred   DC goal     OT LONG TERM GOAL #6   Title  Pt will retain optimal limb volume reductions achieved during Intensive Phase CDT with no more than 3% volume increase with ongoing CG assistance to limit LE progression and further functional decline.    Baseline  min A    Time  6    Period  Months    Status  New            Plan - 11/24/19 1257    Clinical Impression Statement  After MLD and fibrosis techniques to medial popliteal fossa, dense but pliable edema and fibrosis built up at " bottleneck" softened and nearly resolved. Pt tolerated MLD to entire R leg without difficulty. Tissue softening and swelling reduction obvious at end of session. Cont as per POC.       Patient will benefit from skilled therapeutic intervention in order to improve the following deficits and impairments:           Visit Diagnosis: Lymphedema, not elsewhere classified    Problem List Patient Active Problem List   Diagnosis Date Noted  . Vitamin D deficiency 06/28/2019  . Prediabetes 06/28/2019  . GERD (gastroesophageal reflux disease) 12/07/2018  . Vitamin B12 deficiency 12/05/2018  . Senile purpura ([place]) 07/28/2018  . Chronic venous insufficiency 07/14/2018  . Lymphedema 07/14/2018  . Aneurysm of descending thoracic aorta (HCC) 05/04/2018  . Allergic rhinitis 02/16/2018  . Aortic atherosclerosis ([place])  01/13/2018  . Status post placement of implantable loop recorder 01/12/2018  . Chronic kidney disease, stage 3 01/12/2018  . Cerebrovascular accident (CVA) due to embolism of precerebral artery ([place])   . TIA (transient ischemic attack) 01/04/2018  . Osteoarthritis of wrist 12/09/2017  . CAD (coronary artery disease) 11/25/2017  . Caregiver stress 11/25/2017  . Menopause 08/14/2017  . De Quervain's tenosynovitis, right 07/17/2017  . Stress incontinence 06/19/2017  . Personal history of tobacco use, presenting hazards to health 04/01/2017  . Anxiety 03/13/2017  . Glossitis 11/14/2016  . Advanced care planning/counseling discussion 10/17/2016  . Benign neoplasm of sigmoid colon   . COPD (chronic obstructive pulmonary disease) ([place]) 10/17/2015  . Mass of upper inner quadrant of right breast 06/22/2015    Andrey Spearman, MS, OTR/L, [facility] 11/24/19 1:00 PM  [place] MAIN [facility] SERVICES [address] [place], [name], [address] Phone: [phone]   Fax:  [phone]  Name: Sharon [name] MRN: [id number] Date of Birth: 19-Sep-1946

## 2019-11-27 ENCOUNTER — Ambulatory Visit: Payer: Medicare Other | Attending: Nurse Practitioner

## 2019-11-27 ENCOUNTER — Ambulatory Visit: Payer: Medicare Other

## 2019-11-27 DIAGNOSIS — Z23 Encounter for immunization: Secondary | ICD-10-CM

## 2019-11-27 NOTE — Progress Notes (Signed)
   Covid-19 Vaccination Clinic  Name:  Sharon [name]    MRN: [id number] DOB: 1946/03/22  11/27/2019  [name] was observed post Covid-19 immunization for 15 minutes without incidence. She was provided with Vaccine Information Sheet and instruction to access the V-Safe system.   [name] was instructed to call 911 with any severe reactions post vaccine: Marland Kitchen Difficulty breathing  . Swelling of your face and throat  . A fast heartbeat  . A bad rash all over your body  . Dizziness and weakness    Immunizations Administered    Name Date Dose VIS Date Route   Pfizer COVID-19 Vaccine 11/27/2019 11:12 AM 0.3 mL 09/23/2019 Intramuscular   Manufacturer: [place]   Lot: [id number]   [place]: [id number]

## 2019-11-28 ENCOUNTER — Ambulatory Visit (INDEPENDENT_AMBULATORY_CARE_PROVIDER_SITE_OTHER): Payer: Medicare Other | Admitting: *Deleted

## 2019-11-28 DIAGNOSIS — I6310 Cerebral infarction due to embolism of unspecified precerebral artery: Secondary | ICD-10-CM

## 2019-11-28 LAB — CUP PACEART REMOTE DEVICE CHECK
Date Time Interrogation Session: 20210214232605
Implantable Pulse Generator Implant Date: 20190328

## 2019-11-29 ENCOUNTER — Ambulatory Visit: Payer: Medicare Other | Admitting: Occupational Therapy

## 2019-11-29 ENCOUNTER — Other Ambulatory Visit: Payer: Self-pay

## 2019-11-29 ENCOUNTER — Telehealth: Payer: Self-pay

## 2019-11-29 DIAGNOSIS — I890 Lymphedema, not elsewhere classified: Secondary | ICD-10-CM

## 2019-11-29 NOTE — Progress Notes (Signed)
ILR Remote 

## 2019-11-29 NOTE — Therapy (Signed)
[place] MAIN [facility] SERVICES [address] [place], [name], [address] Phone: [phone]   Fax:  [phone]  Patient Details  Name: Sharon [name] MRN: [id number] Date of Birth: [birth year] Referring Provider:  [name], NP  Encounter Date: 11/29/2019 Andrey Spearman, MS, OTR/L, [facility] 11/29/19 4:49 PM    [place] MAIN [facility] SERVICES [address] [place], [name], [address] Phone: [phone]   Fax:  [phone]

## 2019-11-29 NOTE — Therapy (Signed)
[place] MAIN [facility] SERVICES [address] [place], [name], [address] Phone: [phone]   Fax:  [phone]  Occupational Therapy Treatment  Patient Details  Name: Sharon [name] MRN: [id number] Date of Birth: [birth year] Referring Provider (OT): Eulogio Ditch, NP   Encounter Date: 11/29/2019  OT End of Session - 11/29/19 1645    Visit Number  6    Number of Visits  16    OT Start Time  0200    OT Stop Time  0305    OT Time Calculation (min)  65 min    Activity Tolerance  Patient tolerated treatment well;No increased pain    Behavior During Therapy  WFL for tasks assessed/performed       Past Medical History:  Diagnosis Date  . Anxiety   . Arthritis    hands, upper back  . Asthma   . COPD (chronic obstructive pulmonary disease) ([place])   . History of cervical cancer   . Menopausal disorder   . Osteoporosis   . Pneumonia 1960  . Spasm of abdominal muscles of right side    intermittent  . TMJ (dislocation of temporomandibular joint)   . Wears dentures    partial lower    Past Surgical History:  Procedure Laterality Date  . ABDOMINAL HYSTERECTOMY  1970's  . bladder botox  2005  . BLADDER SUSPENSION  2004  . CATARACT EXTRACTION W/PHACO Right 03/09/2018   Procedure: CATARACT EXTRACTION PHACO AND INTRAOCULAR LENS PLACEMENT ([place]) right;  Surgeon: Eulogio Bear, MD;  [place];  Service: Ophthalmology;  Laterality: Right;  CALL CELL 1ST  . CATARACT EXTRACTION W/PHACO Left 04/19/2018   Procedure: CATARACT EXTRACTION PHACO AND INTRAOCULAR LENS PLACEMENT (IOC)  LEFT;  Surgeon: Eulogio Bear, MD;  Location: [place];  Service: Ophthalmology;  Laterality: Left;  . COLONOSCOPY WITH PROPOFOL N/A 12/13/2015   Procedure: COLONOSCOPY WITH PROPOFOL;  Surgeon: Lucilla Lame, MD;  Location: [place];  Service: Endoscopy;  Laterality: N/A;  . LOOP RECORDER INSERTION N/A 01/07/2018   Procedure: LOOP  RECORDER INSERTION;  Surgeon: Deboraha Sprang, MD;  Location: [place] [facility];  Service: Cardiovascular;  Laterality: N/A;  . POLYPECTOMY N/A 12/13/2015   Procedure: POLYPECTOMY;  Surgeon: Lucilla Lame, MD;  Location: [place];  Service: Endoscopy;  Laterality: N/A;  SIGMOID COLON POLYPS X  5  . SHOULDER ARTHROSCOPY W/ ROTATOR CUFF REPAIR Right 1998  . TEE WITHOUT CARDIOVERSION N/A 01/06/2018   Procedure: TRANSESOPHAGEAL ECHOCARDIOGRAM (TEE);  Surgeon: Minna Merritts, MD;  Location: [facility];  Service: Cardiovascular;  Laterality: N/A;  . TONSILLECTOMY AND ADENOIDECTOMY      There were no vitals filed for this visit.  Subjective Assessment - 11/29/19 1639    Subjective   Mrs Sharika Mosquera for OT visit 6 of 36 to address BLE Lymphedema. Pt reports good pain relief to L  posterior knee at popliteal fossa after last treatment. " I could bend my knee to walk better . It was obvious."    Pertinent History  Hx anxiety, COPD, OA, Asthma, hx cervical ca, osteoporosis, hx pneumonia, hx CVA, hx LLE cellulitis, hx L leg wound, hx falls    Limitations  chronic BLE leg swelling and associated pain, decreased balance, fall risk, decreased standing, walking and sitting tolerance, difficulty w/ lower body bathing and dressing, difficulty fitting LB clothing and shoes, unable to drive, impaired transfers and functional mobility, impaired social participation, impaired ability to perform household  chores, cooking, meal prep    Repetition  Increases Symptoms    Special Tests  11/20 dopler negative for DVT and CVI    Patient Stated Goals  reduce leg pain and swelling so I can do more    Pain Onset  Other (comment)   2019                  OT Treatments/Exercises (OP) - 11/29/19 0001      ADLs   ADL Education Given  Yes      Manual Therapy   Manual Therapy  Edema management;Manual Lymphatic Drainage (MLD)    Edema Management  fibrosis techniques to distal leg and ankle    Manual  Lymphatic Drainage (MLD)  MLD ro RLE using short neck, abdominal pathways and functional inguinal LN,. Excellent tolerance.    Compression Bandaging  Max A to don stockings after Rx     due to time constraints and Pt's difficulty reaching feet.             OT Education - 11/29/19 1645    Education Details  Continued skilled Pt/caregiver education  And LE ADL training throughout visit for lymphedema self care/ home program, including compression wrapping, compression garment and device wear/care, lymphatic pumping ther ex, simple self-MLD, and skin care. Discussed progress towards goals.    Person(s) Educated  Patient    Methods  Explanation;Demonstration    Comprehension  Verbalized understanding;Returned demonstration          OT Long Term Goals - 11/22/19 0955      OT LONG TERM GOAL #1   Title  Pt will demonstrate understanding of lymphedema (LE) precautions / prevention principals, including signs / symptoms of cellulitis infection with modified independence using LE Workbook as printed reference to identify 6 precautions without verbal cues by end of 3rd  OT Rx visit.     Baseline  erbal cues    Time  3    Period  Days    Status  Achieved      OT LONG TERM GOAL #2   Title  Pt will be able todon and doff existing compression garments independently and without assistive devices and/ or extra time for optimal lymphedema self management over time and to limit infection risk.    Baseline  supervision    Time  4    Period  Days    Status  Achieved      OT LONG TERM GOAL #3   Title  Pt to achieve at least  10% limb volume reduction in affected limb(s)  bilaterally during Intensive Phase CDT to control limb swelling, to improve tissue integrity and immune function, to improve ADLs performance and to improve functional mobility/ transfer, and to improve body image and self-esteem.    Baseline  max A    Time  8    Period  Weeks    Status  New      OT LONG TERM GOAL #4   Title   Pt will achieve 100% compliance with daily LE self-care home program components, including daily  skin care, simple self-MLD, gradient compression wraps/ compression garments/devices, and therapeutic exercise with Mod CG support to ensure optimal Intensive Phase limb volume reduction to expedite compression garment/ device fitting.    Baseline  50% compliant    Time  12    Period  Weeks    Status  Partially Met      OT LONG TERM GOAL #5  Title  Pt will use appropriate  assistive devices during LE self-care training to don compression garments/devices with modified independence to ensure optimal LE self-management over time and to limit progression of chronic LE.    Baseline  min A    Time  12    Period  Weeks    Status  Deferred   DC goal     OT LONG TERM GOAL #6   Title  Pt will retain optimal limb volume reductions achieved during Intensive Phase CDT with no more than 3% volume increase with ongoing CG assistance to limit LE progression and further functional decline.    Baseline  min A    Time  6    Period  Months    Status  New            Plan - 11/29/19 1646    Clinical Impression Statement  Provided MLD to RLE with deeper fibrosis to dense fibrosis at distal leg to ankle. Pt denied pain during and after MLD. Pt donned compression garments after session with extra time. Minimal tissue density hange palpated after manual therapy today. Cont as epr POC.       Patient will benefit from skilled therapeutic intervention in order to improve the following deficits and impairments:           Visit Diagnosis: Lymphedema, not elsewhere classified    Problem List Patient Active Problem List   Diagnosis Date Noted  . Vitamin D deficiency 06/28/2019  . Prediabetes 06/28/2019  . GERD (gastroesophageal reflux disease) 12/07/2018  . Vitamin B12 deficiency 12/05/2018  . Senile purpura ([place]) 07/28/2018  . Chronic venous insufficiency 07/14/2018  . Lymphedema 07/14/2018  .  Aneurysm of descending thoracic aorta (HCC) 05/04/2018  . Allergic rhinitis 02/16/2018  . Aortic atherosclerosis ([place]) 01/13/2018  . Status post placement of implantable loop recorder 01/12/2018  . Chronic kidney disease, stage 3 01/12/2018  . Cerebrovascular accident (CVA) due to embolism of precerebral artery ([place])   . TIA (transient ischemic attack) 01/04/2018  . Osteoarthritis of wrist 12/09/2017  . CAD (coronary artery disease) 11/25/2017  . Caregiver stress 11/25/2017  . Menopause 08/14/2017  . De Quervain's tenosynovitis, right 07/17/2017  . Stress incontinence 06/19/2017  . Personal history of tobacco use, presenting hazards to health 04/01/2017  . Anxiety 03/13/2017  . Glossitis 11/14/2016  . Advanced care planning/counseling discussion 10/17/2016  . Benign neoplasm of sigmoid colon   . COPD (chronic obstructive pulmonary disease) ([place]) 10/17/2015  . Mass of upper inner quadrant of right breast 06/22/2015    Andrey Spearman, MS, OTR/L, [facility] 11/29/19 4:49 PM  [place] MAIN [facility] SERVICES [address] [place], [name], [address] Phone: [phone]   Fax:  [phone]  Name: MOIRA UMHOLTZ MRN: [id number] Date of Birth: [birth year]

## 2019-11-30 ENCOUNTER — Ambulatory Visit (INDEPENDENT_AMBULATORY_CARE_PROVIDER_SITE_OTHER): Payer: Medicare Other | Admitting: Pharmacist

## 2019-11-30 ENCOUNTER — Encounter: Payer: Medicare Other | Admitting: Occupational Therapy

## 2019-11-30 DIAGNOSIS — J410 Simple chronic bronchitis: Secondary | ICD-10-CM | POA: Diagnosis not present

## 2019-11-30 DIAGNOSIS — I890 Lymphedema, not elsewhere classified: Secondary | ICD-10-CM

## 2019-11-30 NOTE — Patient Instructions (Signed)
Visit Information  Goals Addressed            This Visit's Progress     Patient Stated   . PharmD "My leg is still not completely healed" (pt-stated)       Current Barriers:  . Polypharmacy; complex patient with multiple comorbidities including chronic venous insufficiency, COPD, CAD, hx CVA, with recent cellulitis requiring multiple rounds of antibiotics.  o Continues to see V/V. Reports benefit from lymphedema OT o Reports she and her husband received their first COVID vaccine. Scheduled for second on 11/29/19 o Notes that she went to a chiropractor for weight loss treatment "[facility]", and a nurse with the chiropractor felt her stomach and "how is was poking out" and encouraged her to f/u with a GI doctor. Patient notes she has never had any concerns before, besides acid reflux, and previous ultra sounds have been clear  . Chronic medical problems: o ASCVD (hx CVA); clopidogrel 75 mg daily; atorvastatin 40 mg daily (last LDL 70);  o COPD/Allergies: Symbicort + Albuterol PRN; plan to evaluate benefit of switching to LAMA/LABA moving forward; montelukast 10 mg daily, loratadine 10 mg daily; notes she saw a commercial for Breztri the other day; wonders if this is a better option for her o Depression: citalopram 20 mg daily o Post-menopausal symptoms: estradiol 0.1 mg patch twice weekly o GERD: pantoprazole 40 mg daily  Pharmacist Clinical Goal(s):  Marland Kitchen Over the next 90 days, patient will work with PharmD and provider towards optimized medication management   Interventions: . Comprehensive medication review performed, medication list updated in electronic medical record.  . Will pass patient's GI concerns along to PCP. I asked patient if she would like an in person appointment w/ PCP for physical exam; patient declined, noting that she doesn't think anything is wrong, but would still like PCP to review her chart and see if anything could be wrong with her abdomen . Discussed that  Judithann Sauger is COPD triple therapy. PCP plans for spirometry at upcoming visit to help guide appropriate inhaler therapy. Patient does NOT have an appointment scheduled; last physical exam w/ lab work was in September. Will discuss w/ PCP when she would like follow up scheduled, and then collaborate w/ office staff to outreach patient to schedule.   Patient Self Care Activities:  . Patient will take medications as prescribed  Please see past updates related to this goal by clicking on the "Past Updates" button in the selected goal         Patient verbalizes understanding of instructions provided today.   Plan:  - Scheduled f/u call 01/25/20  Catie Darnelle Maffucci, PharmD, [place] [phone]

## 2019-11-30 NOTE — Chronic Care Management (AMB) (Signed)
Chronic Care Management   Follow Up Note   11/30/2019 Name: Sharon [name] MRN: [id number] DOB: 05-22-1946  Referred by: Sharon Lick, NP Reason for referral : Chronic Care Management (Medication Management)   Sharon [name] is a 74 y.o. year old female who is a primary care patient of [name], Sharon Faster, NP. The CCM team was consulted for assistance with chronic disease management and care coordination needs.    Contacted patient for medication management review.   Review of patient status, including review of consultants reports, relevant laboratory and other test results, and collaboration with appropriate care team members and the patient's provider was performed as part of comprehensive patient evaluation and provision of chronic care management services.    SDOH (Social Determinants of Health) screening performed today: Physical Activity None. See Care Plan for related entries.   Outpatient Encounter Medications as of 11/30/2019  Medication Sig Note  . atorvastatin (LIPITOR) 40 MG tablet TAKE 1 TABLET BY MOUTH ONCE A DAY AT 6 PM   . budesonide-formoterol (SYMBICORT) 160-4.5 MCG/ACT inhaler USE 2 PUFFS BY MOUTH TWICE DAILY.   Marland Kitchen clopidogrel (PLAVIX) 75 MG tablet TAKE 1 TABLET(75 MG) BY MOUTH DAILY   . Cyanocobalamin 1000 MCG/ML KIT Inject 1,000 mcg as directed every 30 (thirty) days.   . montelukast (SINGULAIR) 10 MG tablet Take 1 tablet (10 mg total) by mouth at bedtime.   . pantoprazole (PROTONIX) 40 MG tablet Take 1 tablet (40 mg total) by mouth daily.   . penicillin v potassium (VEETID) 250 MG tablet Take 1 tablet (250 mg total) by mouth 2 (two) times daily.   Marland Kitchen albuterol (PROAIR HFA) 108 (90 Base) MCG/ACT inhaler Inhale 2 puffs into the lungs every 6 (six) hours as needed for wheezing or shortness of breath. 03/16/2019: QID during this acute illness, but generally 1-2x/day  . benzonatate (TESSALON PERLES) 100 MG capsule Take 1 capsule (100 mg total) by mouth 3 (three) times daily  as needed for cough. (Patient not taking: Reported on 11/10/2019)   . citalopram (CELEXA) 20 MG tablet Take 1 tablet (20 mg total) by mouth daily. 05/27/2019: As needed  . estradiol (VIVELLE-DOT) 0.1 MG/24HR patch APPLY 1 PATCH ONTO THE SKIN 2 TIMES A WEEK   . Loratadine (CLARITIN PO) Take by mouth daily.   . meclizine (ANTIVERT) 12.5 MG tablet Take 1 tablet (12.5 mg total) by mouth 3 (three) times daily as needed for dizziness (only if needed).   . Multiple Vitamin (MULTIVITAMIN) tablet Take 2 tablets by mouth daily.    Marland Kitchen triamcinolone cream (KENALOG) 0.1 % Apply 1 application topically 2 (two) times daily. (Patient not taking: Reported on 11/10/2019)   . [DISCONTINUED] amoxicillin (AMOXIL) 875 MG tablet Take 1 tablet (875 mg total) by mouth 2 (two) times daily. (Patient not taking: Reported on 11/10/2019)   . [DISCONTINUED] cephALEXin (KEFLEX) 500 MG capsule Take 1 capsule (500 mg total) by mouth 4 (four) times daily. (Patient not taking: Reported on 11/10/2019)   . [DISCONTINUED] predniSONE (STERAPRED UNI-PAK 21 TAB) 10 MG (21) TBPK tablet Per Packaging Directions (Patient not taking: Reported on 11/10/2019)    No facility-administered encounter medications on file as of 11/30/2019.     Objective:   Goals Addressed            This Visit's Progress     Patient Stated   . PharmD "My leg is still not completely healed" (pt-stated)       Current Barriers:  . Polypharmacy; complex patient  with multiple comorbidities including chronic venous insufficiency, COPD, CAD, hx CVA, with recent cellulitis requiring multiple rounds of antibiotics.  o Continues to see V/V. Reports benefit from lymphedema OT o Reports she and her husband received their first COVID vaccine. Scheduled for second on 11/29/19 o Notes that she went to a chiropractor for weight loss treatment "[facility]", and a nurse with the chiropractor felt her stomach and "how is was poking out" and encouraged her to f/u with a GI doctor.  Patient notes she has never had any concerns before, besides acid reflux, and previous ultra sounds have been clear  . Chronic medical problems: o ASCVD (hx CVA); clopidogrel 75 mg daily; atorvastatin 40 mg daily (last LDL 70);  o COPD/Allergies: Symbicort + Albuterol PRN; plan to evaluate benefit of switching to LAMA/LABA moving forward; montelukast 10 mg daily, loratadine 10 mg daily; notes she saw a commercial for Breztri the other day; wonders if this is a better option for her o Depression: citalopram 20 mg daily o Post-menopausal symptoms: estradiol 0.1 mg patch twice weekly o GERD: pantoprazole 40 mg daily  Pharmacist Clinical Goal(s):  Marland Kitchen Over the next 90 days, patient will work with PharmD and provider towards optimized medication management   Interventions: . Comprehensive medication review performed, medication list updated in electronic medical record.  . Will pass patient's GI concerns along to PCP. I asked patient if she would like an in person appointment w/ PCP for physical exam; patient declined, noting that she doesn't think anything is wrong, but would still like PCP to review her chart and see if anything could be wrong with her abdomen . Discussed that Judithann Sauger is COPD triple therapy. PCP plans for spirometry at upcoming visit to help guide appropriate inhaler therapy. Patient does NOT have an appointment scheduled; last physical exam w/ lab work was in September. Will discuss w/ PCP when she would like follow up scheduled, and then collaborate w/ office staff to outreach patient to schedule.   Patient Self Care Activities:  . Patient will take medications as prescribed  Please see past updates related to this goal by clicking on the "Past Updates" button in the selected goal          Plan:  - Scheduled f/u call 01/25/20  Catie Darnelle Maffucci, PharmD, [place] [phone]

## 2019-12-01 ENCOUNTER — Ambulatory Visit: Payer: Medicare Other | Admitting: Occupational Therapy

## 2019-12-05 ENCOUNTER — Encounter: Payer: Medicare Other | Admitting: Occupational Therapy

## 2019-12-05 ENCOUNTER — Ambulatory Visit: Payer: Self-pay | Admitting: Pharmacist

## 2019-12-05 DIAGNOSIS — J410 Simple chronic bronchitis: Secondary | ICD-10-CM

## 2019-12-05 NOTE — Patient Instructions (Signed)
Visit Information  Goals Addressed            This Visit's Progress     Patient Stated   . PharmD "My leg is still not completely healed" (pt-stated)       Current Barriers:  . Polypharmacy; complex patient with multiple comorbidities including chronic venous insufficiency, COPD, CAD, hx CVA, with recent cellulitis requiring multiple rounds of antibiotics.  o Received message from patient asking if it is OK to take loratidine with penicillin . Chronic medical problems: o ASCVD (hx CVA); clopidogrel 75 mg daily; atorvastatin 40 mg daily (last LDL 70);  o COPD/Allergies: Symbicort + Albuterol PRN; plan to evaluate benefit of switching to LAMA/LABA moving forward; montelukast 10 mg daily, loratadine 10 mg daily; notes she saw a commercial for Breztri the other day; wonders if this is a better option for her o Depression: citalopram 20 mg daily o Post-menopausal symptoms: estradiol 0.1 mg patch twice weekly o GERD: pantoprazole 40 mg daily  Pharmacist Clinical Goal(s):  Marland Kitchen Over the next 90 days, patient will work with PharmD and provider towards optimized medication management   Interventions: . Reassured patient that loratidine is safe to take w/ penicillin. Patient expressed understanding.  Patient Self Care Activities:  . Patient will take medications as prescribed  Please see past updates related to this goal by clicking on the "Past Updates" button in the selected goal         Patient verbalizes understanding of instructions provided today.   Plan:  - Will outreach patient as previously scheduled  Catie Darnelle Maffucci, PharmD, [place] [phone]

## 2019-12-05 NOTE — Chronic Care Management (AMB) (Signed)
Chronic Care Management   Follow Up Note   12/05/2019 Name: Sharon [name] MRN: [id number] DOB: Jan 01, 1946  Referred by: [name], NP Reason for referral : Chronic Care Management (Medication Management)   Sharon [name] is a 74 y.o. year old female who is a primary care patient of [name], [name], NP. The CCM team was consulted for assistance with chronic disease management and care coordination needs.    Medication management questions today.  Review of patient status, including review of consultants reports, relevant laboratory and other test results, and collaboration with appropriate care team members and the patient's provider was performed as part of comprehensive patient evaluation and provision of chronic care management services.    SDOH (Social Determinants of Health) assessments performed: No    Outpatient Encounter Medications as of 12/05/2019  Medication Sig Note  . albuterol (PROAIR HFA) 108 (90 Base) MCG/ACT inhaler Inhale 2 puffs into the lungs every 6 (six) hours as needed for wheezing or shortness of breath. 03/16/2019: QID during this acute illness, but generally 1-2x/day  . atorvastatin (LIPITOR) 40 MG tablet TAKE 1 TABLET BY MOUTH ONCE A DAY AT 6 PM   . benzonatate (TESSALON PERLES) 100 MG capsule Take 1 capsule (100 mg total) by mouth 3 (three) times daily as needed for cough. (Patient not taking: Reported on 11/10/2019)   . budesonide-formoterol (SYMBICORT) 160-4.5 MCG/ACT inhaler USE 2 PUFFS BY MOUTH TWICE DAILY.   . citalopram (CELEXA) 20 MG tablet Take 1 tablet (20 mg total) by mouth daily. 05/27/2019: As needed  . clopidogrel (PLAVIX) 75 MG tablet TAKE 1 TABLET(75 MG) BY MOUTH DAILY   . Cyanocobalamin 1000 MCG/ML KIT Inject 1,000 mcg as directed every 30 (thirty) days.   Marland Kitchen estradiol (VIVELLE-DOT) 0.1 MG/24HR patch APPLY 1 PATCH ONTO THE SKIN 2 TIMES A WEEK   . Loratadine (CLARITIN PO) Take by mouth daily.   . meclizine (ANTIVERT) 12.5 MG tablet Take 1  tablet (12.5 mg total) by mouth 3 (three) times daily as needed for dizziness (only if needed).   . montelukast (SINGULAIR) 10 MG tablet Take 1 tablet (10 mg total) by mouth at bedtime.   . Multiple Vitamin (MULTIVITAMIN) tablet Take 2 tablets by mouth daily.    . pantoprazole (PROTONIX) 40 MG tablet Take 1 tablet (40 mg total) by mouth daily.   . penicillin v potassium (VEETID) 250 MG tablet Take 1 tablet (250 mg total) by mouth 2 (two) times daily.   Marland Kitchen triamcinolone cream (KENALOG) 0.1 % Apply 1 application topically 2 (two) times daily. (Patient not taking: Reported on 11/10/2019)    No facility-administered encounter medications on file as of 12/05/2019.     Objective:   Goals Addressed            This Visit's Progress     Patient Stated   . PharmD "My leg is still not completely healed" (pt-stated)       Current Barriers:  . Polypharmacy; complex patient with multiple comorbidities including chronic venous insufficiency, COPD, CAD, hx CVA, with recent cellulitis requiring multiple rounds of antibiotics.  o Received message from patient asking if it is OK to take loratidine with penicillin . Chronic medical problems: o ASCVD (hx CVA); clopidogrel 75 mg daily; atorvastatin 40 mg daily (last LDL 70);  o COPD/Allergies: Symbicort + Albuterol PRN; plan to evaluate benefit of switching to LAMA/LABA moving forward; montelukast 10 mg daily, loratadine 10 mg daily; notes she saw a commercial for [employer] the other  day; wonders if this is a better option for her o Depression: citalopram 20 mg daily o Post-menopausal symptoms: estradiol 0.1 mg patch twice weekly o GERD: pantoprazole 40 mg daily  Pharmacist Clinical Goal(s):  Marland Kitchen Over the next 90 days, patient will work with PharmD and provider towards optimized medication management   Interventions: . Reassured patient that loratidine is safe to take w/ penicillin. Patient expressed understanding.  Patient Self Care Activities:  . Patient  will take medications as prescribed  Please see past updates related to this goal by clicking on the "Past Updates" button in the selected goal          Plan:  - Will outreach patient as previously scheduled  Catie Darnelle Maffucci, PharmD, [place] [phone]

## 2019-12-06 ENCOUNTER — Encounter: Payer: Medicare Other | Admitting: Occupational Therapy

## 2019-12-07 ENCOUNTER — Encounter: Payer: Medicare Other | Admitting: Occupational Therapy

## 2019-12-09 ENCOUNTER — Telehealth: Payer: Self-pay

## 2019-12-12 ENCOUNTER — Encounter (INDEPENDENT_AMBULATORY_CARE_PROVIDER_SITE_OTHER): Payer: Self-pay

## 2019-12-12 ENCOUNTER — Encounter: Payer: Medicare Other | Admitting: Occupational Therapy

## 2019-12-13 ENCOUNTER — Encounter: Payer: Medicare Other | Admitting: Occupational Therapy

## 2019-12-14 ENCOUNTER — Encounter: Payer: Medicare Other | Admitting: Occupational Therapy

## 2019-12-14 ENCOUNTER — Other Ambulatory Visit: Payer: Self-pay

## 2019-12-14 ENCOUNTER — Ambulatory Visit: Payer: Medicare Other | Attending: Nurse Practitioner | Admitting: Occupational Therapy

## 2019-12-14 DIAGNOSIS — I890 Lymphedema, not elsewhere classified: Secondary | ICD-10-CM | POA: Diagnosis not present

## 2019-12-14 NOTE — Therapy (Signed)
[place] MAIN [facility] SERVICES [address] [place], [name], [address] Phone: [phone]   Fax:  [phone]  Occupational Therapy Treatment  Patient Details  Name: Sharon [name] MRN: [id number] Date of Birth: 10/27/1945 Referring Provider (OT): Eulogio Ditch, NP   Encounter Date: 12/14/2019  OT End of Session - 12/14/19 1641    Visit Number  7    Number of Visits  36    Date for OT Re-Evaluation  02/12/20    OT Start Time  0108    OT Stop Time  0205    OT Time Calculation (min)  57 min    Activity Tolerance  Patient tolerated treatment well;No increased pain    Behavior During Therapy  WFL for tasks assessed/performed       Past Medical History:  Diagnosis Date  . Anxiety   . Arthritis    hands, upper back  . Asthma   . COPD (chronic obstructive pulmonary disease) ([place])   . History of cervical cancer   . Menopausal disorder   . Osteoporosis   . Pneumonia 1960  . Spasm of abdominal muscles of right side    intermittent  . TMJ (dislocation of temporomandibular joint)   . Wears dentures    partial lower    Past Surgical History:  Procedure Laterality Date  . ABDOMINAL HYSTERECTOMY  1970's  . bladder botox  2005  . BLADDER SUSPENSION  2004  . CATARACT EXTRACTION W/PHACO Right 03/09/2018   Procedure: CATARACT EXTRACTION PHACO AND INTRAOCULAR LENS PLACEMENT ([place]) right;  Surgeon: Eulogio Bear, MD;  Location: [place];  Service: Ophthalmology;  Laterality: Right;  CALL CELL 1ST  . CATARACT EXTRACTION W/PHACO Left 04/19/2018   Procedure: CATARACT EXTRACTION PHACO AND INTRAOCULAR LENS PLACEMENT (IOC)  LEFT;  Surgeon: Eulogio Bear, MD;  Location: [place];  Service: Ophthalmology;  Laterality: Left;  . COLONOSCOPY WITH PROPOFOL N/A 12/13/2015   Procedure: COLONOSCOPY WITH PROPOFOL;  Surgeon: Lucilla Lame, MD;  Location: [place];  Service: Endoscopy;  Laterality: N/A;  . LOOP RECORDER INSERTION  N/A 01/07/2018   Procedure: LOOP RECORDER INSERTION;  Surgeon: Deboraha Sprang, MD;  Location: [place] [facility];  Service: Cardiovascular;  Laterality: N/A;  . POLYPECTOMY N/A 12/13/2015   Procedure: POLYPECTOMY;  Surgeon: Lucilla Lame, MD;  Location: [place];  Service: Endoscopy;  Laterality: N/A;  SIGMOID COLON POLYPS X  5  . SHOULDER ARTHROSCOPY W/ ROTATOR CUFF REPAIR Right 1998  . TEE WITHOUT CARDIOVERSION N/A 01/06/2018   Procedure: TRANSESOPHAGEAL ECHOCARDIOGRAM (TEE);  Surgeon: Minna Merritts, MD;  Location: [facility];  Service: Cardiovascular;  Laterality: N/A;  . TONSILLECTOMY AND ADENOIDECTOMY      There were no vitals filed for this visit.  Subjective Assessment - 12/14/19 1310    Subjective   Sharon [name] for OT visit 7 of 36 to address BLE Lymphedema. Pt WAS LAST SEEN ON 11/29/19. She missed appointment  due to bad weather and had no appointment the following week. Pt reports pain level   in B legs and ankles 4/10.                   OT Treatments/Exercises (OP) - 12/14/19 0001      ADLs   ADL Education Given  Yes      Manual Therapy   Manual Therapy  Edema management;Manual Lymphatic Drainage (MLD)    Edema Management  fibrosis techniques to distal leg and  ankle    Manual Lymphatic Drainage (MLD)  MLD ro RLE using short neck, abdominal pathways and functional inguinal LN,. Excellent tolerance.    Compression Bandaging  Max A to don BLE compression stockings to maximize manual Rx time.             OT Education - 12/14/19 1641    Education Details  Continued skilled Pt/caregiver education  And LE ADL training throughout visit for lymphedema self care/ home program, including compression wrapping, compression garment and device wear/care, lymphatic pumping ther ex, simple self-MLD, and skin care. Discussed progress towards goals.    Person(s) Educated  Patient    Methods  Explanation;Demonstration    Comprehension  Verbalized  understanding;Returned demonstration          OT Long Term Goals - 11/22/19 0955      OT LONG TERM GOAL #1   Title  Pt will demonstrate understanding of lymphedema (LE) precautions / prevention principals, including signs / symptoms of cellulitis infection with modified independence using LE Workbook as printed reference to identify 6 precautions without verbal cues by end of 3rd  OT Rx visit.     Baseline  erbal cues    Time  3    Period  Days    Status  Achieved      OT LONG TERM GOAL #2   Title  Pt will be able todon and doff existing compression garments independently and without assistive devices and/ or extra time for optimal lymphedema self management over time and to limit infection risk.    Baseline  supervision    Time  4    Period  Days    Status  Achieved      OT LONG TERM GOAL #3   Title  Pt to achieve at least  10% limb volume reduction in affected limb(s)  bilaterally during Intensive Phase CDT to control limb swelling, to improve tissue integrity and immune function, to improve ADLs performance and to improve functional mobility/ transfer, and to improve body image and self-esteem.    Baseline  max A    Time  8    Period  Weeks    Status  New      OT LONG TERM GOAL #4   Title  Pt will achieve 100% compliance with daily LE self-care home program components, including daily  skin care, simple self-MLD, gradient compression wraps/ compression garments/devices, and therapeutic exercise with Mod CG support to ensure optimal Intensive Phase limb volume reduction to expedite compression garment/ device fitting.    Baseline  50% compliant    Time  12    Period  Weeks    Status  Partially Met      OT LONG TERM GOAL #5   Title  Pt will use appropriate  assistive devices during LE self-care training to don compression garments/devices with modified independence to ensure optimal LE self-management over time and to limit progression of chronic LE.    Baseline  min A     Time  12    Period  Weeks    Status  Deferred   DC goal     OT LONG TERM GOAL #6   Title  Pt will retain optimal limb volume reductions achieved during Intensive Phase CDT with no more than 3% volume increase with ongoing CG assistance to limit LE progression and further functional decline.    Baseline  min A    Time  6    Period  Months    Status  New            Plan - 12/14/19 1642    Clinical Impression Statement  Pt tolerated RLE MLD without difficulty. Tissue density and flexibility is   minimally changed since commencing Rx. We'll explore HOS  devices for insurance coverage. Pt would benefit from these as convoluted foam creates areas of high and low pressure allowing lymphatic  foluid to move easier in ares where protien deposists aree most dense and tissue is most indurated. Cont as per POC.       Patient will benefit from skilled therapeutic intervention in order to improve the following deficits and impairments:           Visit Diagnosis: Lymphedema, not elsewhere classified    Problem List Patient Active Problem List   Diagnosis Date Noted  . Vitamin D deficiency 06/28/2019  . Prediabetes 06/28/2019  . GERD (gastroesophageal reflux disease) 12/07/2018  . Vitamin B12 deficiency 12/05/2018  . Senile purpura ([place]) 07/28/2018  . Chronic venous insufficiency 07/14/2018  . Lymphedema 07/14/2018  . Aneurysm of descending thoracic aorta (HCC) 05/04/2018  . Allergic rhinitis 02/16/2018  . Aortic atherosclerosis ([place]) 01/13/2018  . Status post placement of implantable loop recorder 01/12/2018  . Chronic kidney disease, stage 3 01/12/2018  . Cerebrovascular accident (CVA) due to embolism of precerebral artery ([place])   . TIA (transient ischemic attack) 01/04/2018  . Osteoarthritis of wrist 12/09/2017  . CAD (coronary artery disease) 11/25/2017  . Caregiver stress 11/25/2017  . Menopause 08/14/2017  . De Quervain's tenosynovitis, right 07/17/2017  . Stress  incontinence 06/19/2017  . Personal history of tobacco use, presenting hazards to health 04/01/2017  . Anxiety 03/13/2017  . Glossitis 11/14/2016  . Advanced care planning/counseling discussion 10/17/2016  . Benign neoplasm of sigmoid colon   . COPD (chronic obstructive pulmonary disease) ([place]) 10/17/2015  . Mass of upper inner quadrant of right breast 06/22/2015    Andrey Spearman, MS, OTR/L, [facility] 12/14/19 4:45 PM   [place] MAIN [facility] SERVICES [address] [place], [name], [address] Phone: [phone]   Fax:  [phone]  Name: Sharon [name] MRN: [id number] Date of Birth: 1946-01-07

## 2019-12-15 ENCOUNTER — Ambulatory Visit: Payer: Medicare Other | Admitting: Occupational Therapy

## 2019-12-15 ENCOUNTER — Encounter

## 2019-12-15 ENCOUNTER — Other Ambulatory Visit: Payer: Self-pay

## 2019-12-15 DIAGNOSIS — I890 Lymphedema, not elsewhere classified: Secondary | ICD-10-CM

## 2019-12-15 NOTE — Therapy (Signed)
[place] MAIN [facility] SERVICES [address] [place], [name], [address] Phone: [phone]   Fax:  [phone]  Occupational Therapy Treatment  Patient Details  Name: Sharon [name] MRN: [id number] Date of Birth: [birth year] Referring Provider (OT): Eulogio Ditch, NP   Encounter Date: 12/15/2019  OT End of Session - 12/15/19 1306    Visit Number  8    Number of Visits  36    Date for OT Re-Evaluation  02/12/20    OT Start Time  0102    OT Stop Time  0209    OT Time Calculation (min)  67 min    Activity Tolerance  Patient tolerated treatment well;No increased pain    Behavior During Therapy  WFL for tasks assessed/performed       Past Medical History:  Diagnosis Date  . Anxiety   . Arthritis    hands, upper back  . Asthma   . COPD (chronic obstructive pulmonary disease) ([place])   . History of cervical cancer   . Menopausal disorder   . Osteoporosis   . Pneumonia 1960  . Spasm of abdominal muscles of right side    intermittent  . TMJ (dislocation of temporomandibular joint)   . Wears dentures    partial lower    Past Surgical History:  Procedure Laterality Date  . ABDOMINAL HYSTERECTOMY  1970's  . bladder botox  2005  . BLADDER SUSPENSION  2004  . CATARACT EXTRACTION W/PHACO Right 03/09/2018   Procedure: CATARACT EXTRACTION PHACO AND INTRAOCULAR LENS PLACEMENT ([place]) right;  Surgeon: Eulogio Bear, MD;  Location: [place];  Service: Ophthalmology;  Laterality: Right;  CALL CELL 1ST  . CATARACT EXTRACTION W/PHACO Left 04/19/2018   Procedure: CATARACT EXTRACTION PHACO AND INTRAOCULAR LENS PLACEMENT ([facility])  LEFT;  Surgeon: Eulogio Bear, MD;  Location: [place];  Service: Ophthalmology;  Laterality: Left;  . COLONOSCOPY WITH PROPOFOL N/A 12/13/2015   Procedure: COLONOSCOPY WITH PROPOFOL;  Surgeon: Lucilla Lame, MD;  Location: [place];  Service: Endoscopy;  Laterality: N/A;  . LOOP RECORDER INSERTION  N/A 01/07/2018   Procedure: LOOP RECORDER INSERTION;  Surgeon: Deboraha Sprang, MD;  Location: [place] [facility];  Service: Cardiovascular;  Laterality: N/A;  . POLYPECTOMY N/A 12/13/2015   Procedure: POLYPECTOMY;  Surgeon: Lucilla Lame, MD;  Location: [place];  Service: Endoscopy;  Laterality: N/A;  SIGMOID COLON POLYPS X  5  . SHOULDER ARTHROSCOPY W/ ROTATOR CUFF REPAIR Right 1998  . TEE WITHOUT CARDIOVERSION N/A 01/06/2018   Procedure: TRANSESOPHAGEAL ECHOCARDIOGRAM (TEE);  Surgeon: Minna Merritts, MD;  Location: [facility];  Service: Cardiovascular;  Laterality: N/A;  . TONSILLECTOMY AND ADENOIDECTOMY      There were no vitals filed for this visit.  Subjective Assessment - 12/15/19 1259    Subjective   [name] for OT visit 8 of 36 to address BLE Lymphedema. Pt WAS LAST SEEN ON 11/29/19. She missed appointment  due to bad weather and had no appointment the following week. Pt reports pain level in B hips and knees as 8/18.                   OT Treatments/Exercises (OP) - 12/15/19 0001      ADLs   ADL Education Given  Yes             OT Education - 12/15/19 1300    Education Details  Continued skilled Pt/caregiver education  And LE  ADL training throughout visit for lymphedema self care/ home program, including compression wrapping, compression garment and device wear/care, lymphatic pumping ther ex, simple self-MLD, and skin care. Discussed progress towards goals.    Person(s) Educated  Patient    Methods  Explanation;Demonstration    Comprehension  Verbalized understanding;Returned demonstration          OT Long Term Goals - 11/22/19 0955      OT LONG TERM GOAL #1   Title  Pt will demonstrate understanding of lymphedema (LE) precautions / prevention principals, including signs / symptoms of cellulitis infection with modified independence using LE Workbook as printed reference to identify 6 precautions without verbal cues by end of 3rd   OT Rx visit.     Baseline  erbal cues    Time  3    Period  Days    Status  Achieved      OT LONG TERM GOAL #2   Title  Pt will be able todon and doff existing compression garments independently and without assistive devices and/ or extra time for optimal lymphedema self management over time and to limit infection risk.    Baseline  supervision    Time  4    Period  Days    Status  Achieved      OT LONG TERM GOAL #3   Title  Pt to achieve at least  10% limb volume reduction in affected limb(s)  bilaterally during Intensive Phase CDT to control limb swelling, to improve tissue integrity and immune function, to improve ADLs performance and to improve functional mobility/ transfer, and to improve body image and self-esteem.    Baseline  max A    Time  8    Period  Weeks    Status  New      OT LONG TERM GOAL #4   Title  Pt will achieve 100% compliance with daily LE self-care home program components, including daily  skin care, simple self-MLD, gradient compression wraps/ compression garments/devices, and therapeutic exercise with Mod CG support to ensure optimal Intensive Phase limb volume reduction to expedite compression garment/ device fitting.    Baseline  50% compliant    Time  12    Period  Weeks    Status  Partially Met      OT LONG TERM GOAL #5   Title  Pt will use appropriate  assistive devices during LE self-care training to don compression garments/devices with modified independence to ensure optimal LE self-management over time and to limit progression of chronic LE.    Baseline  min A    Time  12    Period  Weeks    Status  Deferred   DC goal     OT LONG TERM GOAL #6   Title  Pt will retain optimal limb volume reductions achieved during Intensive Phase CDT with no more than 3% volume increase with ongoing CG assistance to limit LE progression and further functional decline.    Baseline  min A    Time  6    Period  Months    Status  New            Plan -  12/15/19 1410    Clinical Impression Statement  Emphasis of session on MLD and fibrosis techniques to BLE at ankles and distal legs. Dense pitting , as is typical, softened after treatment with slight increase in skin flexibility. Pt tolerated MLD without difficulty. She donned compression garments independently at end  of session. Cont as per POC.       Patient will benefit from skilled therapeutic intervention in order to improve the following deficits and impairments:           Visit Diagnosis: Lymphedema, not elsewhere classified    Problem List Patient Active Problem List   Diagnosis Date Noted  . Vitamin D deficiency 06/28/2019  . Prediabetes 06/28/2019  . GERD (gastroesophageal reflux disease) 12/07/2018  . Vitamin B12 deficiency 12/05/2018  . Senile purpura ([place]) 07/28/2018  . Chronic venous insufficiency 07/14/2018  . Lymphedema 07/14/2018  . Aneurysm of descending thoracic aorta (HCC) 05/04/2018  . Allergic rhinitis 02/16/2018  . Aortic atherosclerosis ([place]) 01/13/2018  . Status post placement of implantable loop recorder 01/12/2018  . Chronic kidney disease, stage 3 01/12/2018  . Cerebrovascular accident (CVA) due to embolism of precerebral artery ([place])   . TIA (transient ischemic attack) 01/04/2018  . Osteoarthritis of wrist 12/09/2017  . CAD (coronary artery disease) 11/25/2017  . Caregiver stress 11/25/2017  . Menopause 08/14/2017  . De Quervain's tenosynovitis, right 07/17/2017  . Stress incontinence 06/19/2017  . Personal history of tobacco use, presenting hazards to health 04/01/2017  . Anxiety 03/13/2017  . Glossitis 11/14/2016  . Advanced care planning/counseling discussion 10/17/2016  . Benign neoplasm of sigmoid colon   . COPD (chronic obstructive pulmonary disease) ([place]) 10/17/2015  . Mass of upper inner quadrant of right breast 06/22/2015    Andrey Spearman, MS, OTR/L, [facility] 12/15/19 2:13 PM   [place]  MAIN [facility] SERVICES [address] [place], [name], [address] Phone: [phone]   Fax:  [phone]  Name: Sharon [name] MRN: [id number] Date of Birth: [birth year]

## 2019-12-19 ENCOUNTER — Ambulatory Visit (INDEPENDENT_AMBULATORY_CARE_PROVIDER_SITE_OTHER): Payer: Medicare Other

## 2019-12-19 ENCOUNTER — Other Ambulatory Visit: Payer: Self-pay

## 2019-12-19 ENCOUNTER — Encounter: Payer: Medicare Other | Admitting: Occupational Therapy

## 2019-12-19 DIAGNOSIS — K140 Glossitis: Secondary | ICD-10-CM

## 2019-12-19 MED ORDER — CYANOCOBALAMIN 1000 MCG/ML IJ SOLN
1000.0000 ug | Freq: Once | INTRAMUSCULAR | Status: AC
  Administered 2019-12-19: 1000 ug via INTRAMUSCULAR

## 2019-12-20 ENCOUNTER — Encounter: Payer: Self-pay | Admitting: Nurse Practitioner

## 2019-12-20 ENCOUNTER — Ambulatory Visit: Payer: Medicare Other | Admitting: Occupational Therapy

## 2019-12-20 ENCOUNTER — Other Ambulatory Visit: Payer: Self-pay | Admitting: Nurse Practitioner

## 2019-12-20 DIAGNOSIS — I890 Lymphedema, not elsewhere classified: Secondary | ICD-10-CM | POA: Diagnosis not present

## 2019-12-20 MED ORDER — CLOPIDOGREL BISULFATE 75 MG PO TABS: ORAL_TABLET | ORAL | 3 refills | Status: DC

## 2019-12-20 NOTE — Therapy (Signed)
[place] MAIN [facility] SERVICES [address] [place], [name], [address] Phone: [phone]   Fax:  [phone]  Occupational Therapy Treatment  Patient Details  Name: Sharon [name] MRN: [id number] Date of Birth: [birth year] Referring Provider (OT): Eulogio Ditch, NP   Encounter Date: 12/20/2019  OT End of Session - 12/20/19 1326    Visit Number  9    Number of Visits  36    Date for OT Re-Evaluation  02/12/20    Authorization - Visit Number  3    Authorization - Number of Visits  78    OT Start Time  0120    OT Stop Time  0230    OT Time Calculation (min)  70 min    Activity Tolerance  Patient tolerated treatment well;No increased pain    Behavior During Therapy  WFL for tasks assessed/performed       Past Medical History:  Diagnosis Date  . Anxiety   . Arthritis    hands, upper back  . Asthma   . COPD (chronic obstructive pulmonary disease) ([place])   . History of cervical cancer   . Menopausal disorder   . Osteoporosis   . Pneumonia 1960  . Spasm of abdominal muscles of right side    intermittent  . TMJ (dislocation of temporomandibular joint)   . Wears dentures    partial lower    Past Surgical History:  Procedure Laterality Date  . ABDOMINAL HYSTERECTOMY  1970's  . bladder botox  2005  . BLADDER SUSPENSION  2004  . CATARACT EXTRACTION W/PHACO Right 03/09/2018   Procedure: CATARACT EXTRACTION PHACO AND INTRAOCULAR LENS PLACEMENT ([place]) right;  Surgeon: Eulogio Bear, MD;  Location: [place];  Service: Ophthalmology;  Laterality: Right;  CALL CELL 1ST  . CATARACT EXTRACTION W/PHACO Left 04/19/2018   Procedure: CATARACT EXTRACTION PHACO AND INTRAOCULAR LENS PLACEMENT (IOC)  LEFT;  Surgeon: Eulogio Bear, MD;  Location: [place];  Service: Ophthalmology;  Laterality: Left;  . COLONOSCOPY WITH PROPOFOL N/A 12/13/2015   Procedure: COLONOSCOPY WITH PROPOFOL;  Surgeon: Lucilla Lame, MD;  Location: [facility];  Service: Endoscopy;  Laterality: N/A;  . LOOP RECORDER INSERTION N/A 01/07/2018   Procedure: LOOP RECORDER INSERTION;  Surgeon: Deboraha Sprang, MD;  Location: [place] [facility];  Service: Cardiovascular;  Laterality: N/A;  . POLYPECTOMY N/A 12/13/2015   Procedure: POLYPECTOMY;  Surgeon: Lucilla Lame, MD;  Location: [facility];  Service: Endoscopy;  Laterality: N/A;  SIGMOID COLON POLYPS X  5  . SHOULDER ARTHROSCOPY W/ ROTATOR CUFF REPAIR Right 1998  . TEE WITHOUT CARDIOVERSION N/A 01/06/2018   Procedure: TRANSESOPHAGEAL ECHOCARDIOGRAM (TEE);  Surgeon: Minna Merritts, MD;  Location: [facility];  Service: Cardiovascular;  Laterality: N/A;  . TONSILLECTOMY AND ADENOIDECTOMY      There were no vitals filed for this visit.  Subjective Assessment - 12/20/19 1319    Subjective   [name] for OT visit 9 of 36 to address BLE Lymphedema. Pt reports leg pain is concentrated distally and anjles feel very tight today. Pt rates pain at  /10.                   OT Treatments/Exercises (OP) - 12/20/19 0001      ADLs   ADL Education Given  Yes      Manual Therapy   Manual Therapy  Edema management;Manual Lymphatic Drainage (MLD);Soft tissue mobilization    Manual therapy  comments  skin care w/ low ph castor oil throughout MLD to increase skin hydration and tissue mobuility/flexibility.    Edema Management  fibrosis techniques to distal leg and ankle    Soft tissue mobilization  MFR    Manual Lymphatic Drainage (MLD)  MLD ro BLE using short neck, abdominal pathways and functional inguinal LN,. Excellent tolerance.    Compression Bandaging  Pt donts           compression socks independently after session.             OT Education - 12/20/19 1326    Education Details  Continued skilled Pt/caregiver education  And LE ADL training throughout visit for lymphedema self care/ home program, including compression wrapping, compression garment and device  wear/care, lymphatic pumping ther ex, simple self-MLD, and skin care. Discussed progress towards goals.    Person(s) Educated  Patient    Methods  Explanation;Demonstration    Comprehension  Verbalized understanding;Returned demonstration          OT Long Term Goals - 11/22/19 0955      OT LONG TERM GOAL #1   Title  Pt will demonstrate understanding of lymphedema (LE) precautions / prevention principals, including signs / symptoms of cellulitis infection with modified independence using LE Workbook as printed reference to identify 6 precautions without verbal cues by end of 3rd  OT Rx visit.     Baseline  erbal cues    Time  3    Period  Days    Status  Achieved      OT LONG TERM GOAL #2   Title  Pt will be able todon and doff existing compression garments independently and without assistive devices and/ or extra time for optimal lymphedema self management over time and to limit infection risk.    Baseline  supervision    Time  4    Period  Days    Status  Achieved      OT LONG TERM GOAL #3   Title  Pt to achieve at least  10% limb volume reduction in affected limb(s)  bilaterally during Intensive Phase CDT to control limb swelling, to improve tissue integrity and immune function, to improve ADLs performance and to improve functional mobility/ transfer, and to improve body image and self-esteem.    Baseline  max A    Time  8    Period  Weeks    Status  New      OT LONG TERM GOAL #4   Title  Pt will achieve 100% compliance with daily LE self-care home program components, including daily  skin care, simple self-MLD, gradient compression wraps/ compression garments/devices, and therapeutic exercise with Mod CG support to ensure optimal Intensive Phase limb volume reduction to expedite compression garment/ device fitting.    Baseline  50% compliant    Time  12    Period  Weeks    Status  Partially Met      OT LONG TERM GOAL #5   Title  Pt will use appropriate  assistive devices  during LE self-care training to don compression garments/devices with modified independence to ensure optimal LE self-management over time and to limit progression of chronic LE.    Baseline  min A    Time  12    Period  Weeks    Status  Deferred   DC goal     OT LONG TERM GOAL #6   Title  Pt will retain optimal limb volume  reductions achieved during Intensive Phase CDT with no more than 3% volume increase with ongoing CG assistance to limit LE progression and further functional decline.    Baseline  min A    Time  6    Period  Months    Status  New            Plan - 12/20/19 1434    Clinical Impression Statement  Pt reporting R ankle is more flexible after treatment. Applied kinesiotape sample to R inner arm to test skin sensitivity. Majority of session spent on deep fibrosis techniques to distal legs and ankles to increase AROM and discomfort. Next visit we'll tape ankles to passively mobilize skin.       Patient will benefit from skilled therapeutic intervention in order to improve the following deficits and impairments:           Visit Diagnosis: Lymphedema, not elsewhere classified    Problem List Patient Active Problem List   Diagnosis Date Noted  . Vitamin D deficiency 06/28/2019  . Prediabetes 06/28/2019  . GERD (gastroesophageal reflux disease) 12/07/2018  . Vitamin B12 deficiency 12/05/2018  . Senile purpura ([place]) 07/28/2018  . Chronic venous insufficiency 07/14/2018  . Lymphedema 07/14/2018  . Aneurysm of descending thoracic aorta (HCC) 05/04/2018  . Allergic rhinitis 02/16/2018  . Aortic atherosclerosis ([place]) 01/13/2018  . Status post placement of implantable loop recorder 01/12/2018  . Chronic kidney disease, stage 3 01/12/2018  . Cerebrovascular accident (CVA) due to embolism of precerebral artery ([place])   . TIA (transient ischemic attack) 01/04/2018  . Osteoarthritis of wrist 12/09/2017  . CAD (coronary artery disease) 11/25/2017  . Caregiver  stress 11/25/2017  . Menopause 08/14/2017  . De Quervain's tenosynovitis, right 07/17/2017  . Stress incontinence 06/19/2017  . Personal history of tobacco use, presenting hazards to health 04/01/2017  . Anxiety 03/13/2017  . Glossitis 11/14/2016  . Advanced care planning/counseling discussion 10/17/2016  . Benign neoplasm of sigmoid colon   . COPD (chronic obstructive pulmonary disease) ([place]) 10/17/2015  . Mass of upper inner quadrant of right breast 06/22/2015    Andrey Spearman, MS, OTR/L, [facility] 12/20/19 2:43 PM  [place] MAIN [facility] SERVICES [address] [place], [name], [address] Phone: [phone]   Fax:  [phone]  Name: Sharon [name] MRN: [id number] Date of Birth: [birth year]

## 2019-12-22 ENCOUNTER — Other Ambulatory Visit: Payer: Self-pay

## 2019-12-22 ENCOUNTER — Ambulatory Visit: Payer: Medicare Other | Admitting: Occupational Therapy

## 2019-12-22 DIAGNOSIS — I890 Lymphedema, not elsewhere classified: Secondary | ICD-10-CM | POA: Diagnosis not present

## 2019-12-22 NOTE — Therapy (Signed)
[place] MAIN [facility] SERVICES [address] [place], [name], [address] Phone: [phone]   Fax:  [phone]  Occupational Therapy Treatment  Patient Details  Name: Sharon [name] MRN: [id number] Date of Birth: Jan 20, 1946 Referring Provider (OT): Eulogio Ditch, NP   Encounter Date: 12/22/2019  OT End of Session - 12/22/19 1459    Visit Number  10    Number of Visits  36    Date for OT Re-Evaluation  02/12/20    Authorization - Visit Number  3    Authorization - Number of Visits  36    OT Start Time  0102    OT Stop Time  0205    OT Time Calculation (min)  63 min    Activity Tolerance  Patient tolerated treatment well;No increased pain    Behavior During Therapy  WFL for tasks assessed/performed       Past Medical History:  Diagnosis Date  . Anxiety   . Arthritis    hands, upper back  . Asthma   . COPD (chronic obstructive pulmonary disease) ([place])   . History of cervical cancer   . Menopausal disorder   . Osteoporosis   . Pneumonia 1960  . Spasm of abdominal muscles of right side    intermittent  . TMJ (dislocation of temporomandibular joint)   . Wears dentures    partial lower    Past Surgical History:  Procedure Laterality Date  . ABDOMINAL HYSTERECTOMY  1970's  . bladder botox  2005  . BLADDER SUSPENSION  2004  . CATARACT EXTRACTION W/PHACO Right 03/09/2018   Procedure: CATARACT EXTRACTION PHACO AND INTRAOCULAR LENS PLACEMENT ([place]) right;  Surgeon: Eulogio Bear, MD;  Location: [place];  Service: Ophthalmology;  Laterality: Right;  CALL CELL 1ST  . CATARACT EXTRACTION W/PHACO Left 04/19/2018   Procedure: CATARACT EXTRACTION PHACO AND INTRAOCULAR LENS PLACEMENT (IOC)  LEFT;  Surgeon: Eulogio Bear, MD;  Location: [place];  Service: Ophthalmology;  Laterality: Left;  . COLONOSCOPY WITH PROPOFOL N/A 12/13/2015   Procedure: COLONOSCOPY WITH PROPOFOL;  Surgeon: Lucilla Lame, MD;  Location: [facility];  Service: Endoscopy;  Laterality: N/A;  . LOOP RECORDER INSERTION N/A 01/07/2018   Procedure: LOOP RECORDER INSERTION;  Surgeon: Deboraha Sprang, MD;  Location: [place] [facility];  Service: Cardiovascular;  Laterality: N/A;  . POLYPECTOMY N/A 12/13/2015   Procedure: POLYPECTOMY;  Surgeon: Lucilla Lame, MD;  Location: [place];  Service: Endoscopy;  Laterality: N/A;  SIGMOID COLON POLYPS X  5  . SHOULDER ARTHROSCOPY W/ ROTATOR CUFF REPAIR Right 1998  . TEE WITHOUT CARDIOVERSION N/A 01/06/2018   Procedure: TRANSESOPHAGEAL ECHOCARDIOGRAM (TEE);  Surgeon: Minna Merritts, MD;  Location: [facility];  Service: Cardiovascular;  Laterality: N/A;  . TONSILLECTOMY AND ADENOIDECTOMY      There were no vitals filed for this visit.  Subjective Assessment - 12/22/19 1251    Subjective   Mrs Maham Quintin for OT visit 10 of 36 to address BLE Lymphedema. Pt reports leg pain is concentrated distally and anjles feel very tight today. Pt rates pain at  /10.          LYMPHEDEMA/ONCOLOGY QUESTIONNAIRE - 12/22/19 1505      Lymphedema Assessments   Lymphedema Assessments  Lower extremities      Right Lower Extremity Lymphedema   Other  RLE limb volume from ankle to tibial tuberosity = 3358.98  OT Treatments/Exercises (OP) - 12/22/19 0001      ADLs   ADL Education Given  Yes      Manual Therapy   Manual Therapy  Edema management    Manual therapy comments  skin care w/ low ph castor oil throughout MLD to increase skin hydration and tissue mobuility/flexibility.    Edema Management  comparative limb volumetrics    Manual Lymphatic Drainage (MLD)  MLD ro BLE using short neck, abdominal pathways and functional inguinal LN,. Excellent tolerance.    Compression Bandaging  Pt dons         compression socks independently after session.             OT Education - 12/22/19 1401    Education Details  Continued skilled Pt/caregiver education  And LE ADL  training throughout visit for lymphedema self care/ home program, including compression wrapping, compression garment and device wear/care, lymphatic pumping ther ex, simple self-MLD, and skin care. Discussed progress towards goals.    Person(s) Educated  Patient    Methods  Explanation;Demonstration    Comprehension  Verbalized understanding;Returned demonstration          OT Long Term Goals - 11/22/19 0955      OT LONG TERM GOAL #1   Title  Pt will demonstrate understanding of lymphedema (LE) precautions / prevention principals, including signs / symptoms of cellulitis infection with modified independence using LE Workbook as printed reference to identify 6 precautions without verbal cues by end of 3rd  OT Rx visit.     Baseline  erbal cues    Time  3    Period  Days    Status  Achieved      OT LONG TERM GOAL #2   Title  Pt will be able todon and doff existing compression garments independently and without assistive devices and/ or extra time for optimal lymphedema self management over time and to limit infection risk.    Baseline  supervision    Time  4    Period  Days    Status  Achieved      OT LONG TERM GOAL #3   Title  Pt to achieve at least  10% limb volume reduction in affected limb(s)  bilaterally during Intensive Phase CDT to control limb swelling, to improve tissue integrity and immune function, to improve ADLs performance and to improve functional mobility/ transfer, and to improve body image and self-esteem.    Baseline  max A    Time  8    Period  Weeks    Status  New      OT LONG TERM GOAL #4   Title  Pt will achieve 100% compliance with daily LE self-care home program components, including daily  skin care, simple self-MLD, gradient compression wraps/ compression garments/devices, and therapeutic exercise with Mod CG support to ensure optimal Intensive Phase limb volume reduction to expedite compression garment/ device fitting.    Baseline  50% compliant    Time   12    Period  Weeks    Status  Partially Met      OT LONG TERM GOAL #5   Title  Pt will use appropriate  assistive devices during LE self-care training to don compression garments/devices with modified independence to ensure optimal LE self-management over time and to limit progression of chronic LE.    Baseline  min A    Time  12    Period  Weeks    Status  Deferred   DC goal     OT LONG TERM GOAL #6   Title  Pt will retain optimal limb volume reductions achieved during Intensive Phase CDT with no more than 3% volume increase with ongoing CG assistance to limit LE progression and further functional decline.    Baseline  min A    Time  6    Period  Months    Status  New            Plan - 12/22/19 1500    Clinical Impression Statement  BLE comparative limb volumetrics reveal legs are nearly symetrical in volume with 4% limb volume differential , L>R. Deep and stubborn induration at ankles and distal legs continues to imit skin flexibility.Pt demonstrates pretty good compliance with compression stockings for daytime. I would estimate she is > 50% compliant, but not quite 85%. We discussed increasing activity level today with home based walking, or exercise regime. Also discussed benefits of cardiopulmonary rehab program. I gave her info re [employer]neakers earlier last year before pandemic lock down. Cont as per POC. Cont to support increaSED COMPRESSION COMPLIANCE AND INCREASED ACTIVITY LEVEL BETWEEN VISITS.       Patient will benefit from skilled therapeutic intervention in order to improve the following deficits and impairments:           Visit Diagnosis: Lymphedema, not elsewhere classified    Problem List Patient Active Problem List   Diagnosis Date Noted  . Vitamin D deficiency 06/28/2019  . Prediabetes 06/28/2019  . GERD (gastroesophageal reflux disease) 12/07/2018  . Vitamin B12 deficiency 12/05/2018  . Senile purpura ([place]) 07/28/2018  . Chronic venous  insufficiency 07/14/2018  . Lymphedema 07/14/2018  . Aneurysm of descending thoracic aorta (HCC) 05/04/2018  . Allergic rhinitis 02/16/2018  . Aortic atherosclerosis ([place]) 01/13/2018  . Status post placement of implantable loop recorder 01/12/2018  . Chronic kidney disease, stage 3 01/12/2018  . Cerebrovascular accident (CVA) due to embolism of precerebral artery ([place])   . TIA (transient ischemic attack) 01/04/2018  . Osteoarthritis of wrist 12/09/2017  . CAD (coronary artery disease) 11/25/2017  . Caregiver stress 11/25/2017  . Menopause 08/14/2017  . De Quervain's tenosynovitis, right 07/17/2017  . Stress incontinence 06/19/2017  . Personal history of tobacco use, presenting hazards to health 04/01/2017  . Anxiety 03/13/2017  . Glossitis 11/14/2016  . Advanced care planning/counseling discussion 10/17/2016  . Benign neoplasm of sigmoid colon   . COPD (chronic obstructive pulmonary disease) ([place]) 10/17/2015  . Mass of upper inner quadrant of right breast 06/22/2015    Andrey Spearman, MS, OTR/L, [facility] 12/22/19 3:11 PM  [place] MAIN [facility] SERVICES [address] [place], [name], [address] Phone: [phone]   Fax:  [phone]  Name: CALIYA NARINE MRN: [id number] Date of Birth: 09-08-1946

## 2019-12-26 ENCOUNTER — Encounter: Payer: Medicare Other | Admitting: Occupational Therapy

## 2019-12-27 ENCOUNTER — Ambulatory Visit: Payer: Medicare Other | Attending: Internal Medicine

## 2019-12-27 ENCOUNTER — Encounter: Payer: Medicare Other | Admitting: Occupational Therapy

## 2019-12-27 DIAGNOSIS — Z23 Encounter for immunization: Secondary | ICD-10-CM

## 2019-12-27 NOTE — Progress Notes (Signed)
   Covid-19 Vaccination Clinic  Name:  BIANICA GOMBAR    MRN: [id number] DOB: 1946/08/09  12/27/2019  Ms. Omori was observed post Covid-19 immunization for 15 minutes without incident. She was provided with Vaccine Information Sheet and instruction to access the V-Safe system.   Ms. Barbush was instructed to call 911 with any severe reactions post vaccine: Marland Kitchen Difficulty breathing  . Swelling of face and throat  . A fast heartbeat  . A bad rash all over body  . Dizziness and weakness   Immunizations Administered    Name Date Dose VIS Date Route   Pfizer COVID-19 Vaccine 12/27/2019 12:57 PM 0.3 mL 09/23/2019 Intramuscular   Manufacturer: [place]   Lot: [id number]   [place]: [id number]

## 2019-12-28 ENCOUNTER — Ambulatory Visit: Payer: Self-pay

## 2019-12-28 NOTE — Chronic Care Management (AMB) (Signed)
  Care Management   Follow Up Note   12/28/2019 Name: Sharon [name] MRN: [id number] DOB: 1946-06-20  Referred by: [name], NP Reason for referral : [place] is a 74 y.o. year old female who is a primary care patient of [name], [name], NP. The care management team was consulted for assistance with care management and care coordination needs.    Review of patient status, including review of consultants reports, relevant laboratory and other test results, and collaboration with appropriate care team members and the patient's provider was performed as part of comprehensive patient evaluation and provision of chronic care management services.    LCSW completed CCM outreach attempt today but was unable to reach patient successfully. A HIPPA compliant voice message was left encouraging patient to return call once available. LCSW rescheduled CCM SW appointment as well.  A HIPPA compliant phone message was left for the patient providing contact information and requesting a return call.   [name], BSW, MSW, [facility] Care Management [email or web address] Phone: [phone]

## 2019-12-29 ENCOUNTER — Ambulatory Visit: Payer: Medicare Other | Admitting: Occupational Therapy

## 2019-12-29 ENCOUNTER — Ambulatory Visit (INDEPENDENT_AMBULATORY_CARE_PROVIDER_SITE_OTHER): Payer: Medicare Other | Admitting: *Deleted

## 2019-12-29 ENCOUNTER — Other Ambulatory Visit: Payer: Self-pay

## 2019-12-29 DIAGNOSIS — I890 Lymphedema, not elsewhere classified: Secondary | ICD-10-CM | POA: Diagnosis not present

## 2019-12-29 DIAGNOSIS — I6310 Cerebral infarction due to embolism of unspecified precerebral artery: Secondary | ICD-10-CM

## 2019-12-29 LAB — CUP PACEART REMOTE DEVICE CHECK
Date Time Interrogation Session: 20210318012213
Implantable Pulse Generator Implant Date: 20190328

## 2019-12-29 NOTE — Therapy (Signed)
[place] MA[facility] SERVICES [address] [place], [name], [address] Phone: [phone]   Fax:  [phone]  Occupational Therapy Treatment  Patient Details  Name: Sharon [name] MRN: [id number] Date of Birth: 1946/08/17 Referring Provider (OT): Eulogio Ditch, NP   Encounter Date: 12/29/2019  OT End of Session - 12/29/19 1227    Visit Number  11    Number of Visits  36    Date for OT Re-Evaluation  02/12/20    Authorization - Visit Number  3    Authorization - Number of Visits  42    OT Start Time  9357    OT Stop Time  1408    OT Time Calculation (min)  73 min    Activity Tolerance  Patient tolerated treatment well;No increased pain    Behavior During Therapy  WFL for tasks assessed/performed       Past Medical History:  Diagnosis Date  . Anxiety   . Arthritis    hands, upper back  . Asthma   . COPD (chronic obstructive pulmonary disease) ([place])   . History of cervical cancer   . Menopausal disorder   . Osteoporosis   . Pneumonia 1960  . Spasm of abdominal muscles of right side    intermittent  . TMJ (dislocation of temporomandibular joint)   . Wears dentures    partial lower    Past Surgical History:  Procedure Laterality Date  . ABDOMINAL HYSTERECTOMY  1970's  . bladder botox  2005  . BLADDER SUSPENSION  2004  . CATARACT EXTRACTION W/PHACO Right 03/09/2018   Procedure: CATARACT EXTRACTION PHACO AND INTRAOCULAR LENS PLACEMENT ([place]) right;  Surgeon: Eulogio Bear, MD;  Location: [place];  Service: Ophthalmology;  Laterality: Right;  CALL CELL 1ST  . CATARACT EXTRACTION W/PHACO Left 04/19/2018   Procedure: CATARACT EXTRACTION PHACO AND INTRAOCULAR LENS PLACEMENT (IOC)  LEFT;  Surgeon: Eulogio Bear, MD;  Location: [place];  Service: Ophthalmology;  Laterality: Left;  . COLONOSCOPY WITH PROPOFOL N/A 12/13/2015   Procedure: COLONOSCOPY WITH PROPOFOL;  Surgeon: Lucilla Lame, MD;  Location: [place];  Service: Endoscopy;  Laterality: N/A;  . LOOP RECORDER INSERTION N/A 01/07/2018   Procedure: LOOP RECORDER INSERTION;  Surgeon: Deboraha Sprang, MD;  Location: [place] [facility];  Service: Cardiovascular;  Laterality: N/A;  . POLYPECTOMY N/A 12/13/2015   Procedure: POLYPECTOMY;  Surgeon: Lucilla Lame, MD;  Location: [place];  Service: Endoscopy;  Laterality: N/A;  SIGMOID COLON POLYPS X  5  . SHOULDER ARTHROSCOPY W/ ROTATOR CUFF REPAIR Right 1998  . TEE WITHOUT CARDIOVERSION N/A 01/06/2018   Procedure: TRANSESOPHAGEAL ECHOCARDIOGRAM (TEE);  Surgeon: Minna Merritts, MD;  Location: [facility];  Service: Cardiovascular;  Laterality: N/A;  . TONSILLECTOMY AND ADENOIDECTOMY      There were no vitals filed for this visit.  Subjective Assessment - 12/29/19 1224    Subjective   [name] for OT visit 11 of 36 to address BLE Lymphedema. Pt reports R ankle and leg pain at 5/10 and L ankle/leg pain at 3/10.                   OT Treatments/Exercises (OP) - 12/29/19 0001      ADLs   ADL Education Given  Yes      Manual Therapy   Manual Therapy  Edema management    Manual therapy comments  skin care w/ low ph castor oil  throughout MLD to increase skin hydration and tissue mobuility/flexibility.    Manual Lymphatic Drainage (MLD)  MLD ro BLE using short neck, abdominal pathways and functional inguinal LN,. Fibrosis techniques to ankles and feet bilaterally    Compression Bandaging  Pt dons         compression socks independently after session.             OT Education - 12/29/19 1227    Education Details  Pt edu for ankle flexibility ther ex using rigid leg lifter to hold dorsiflexion slow stretch. Provided printed resource for online outlet for leg lifters.    Person(s) Educated  Patient    Methods  Explanation;Demonstration    Comprehension  Verbalized understanding;Returned demonstration          OT Long Term Goals - 11/22/19 0955       OT LONG TERM GOAL #1   Title  Pt will demonstrate understanding of lymphedema (LE) precautions / prevention principals, including signs / symptoms of cellulitis infection with modified independence using LE Workbook as printed reference to identify 6 precautions without verbal cues by end of 3rd  OT Rx visit.     Baseline  erbal cues    Time  3    Period  Days    Status  Achieved      OT LONG TERM GOAL #2   Title  Pt will be able todon and doff existing compression garments independently and without assistive devices and/ or extra time for optimal lymphedema self management over time and to limit infection risk.    Baseline  supervision    Time  4    Period  Days    Status  Achieved      OT LONG TERM GOAL #3   Title  Pt to achieve at least  10% limb volume reduction in affected limb(s)  bilaterally during Intensive Phase CDT to control limb swelling, to improve tissue integrity and immune function, to improve ADLs performance and to improve functional mobility/ transfer, and to improve body image and self-esteem.    Baseline  max A    Time  8    Period  Weeks    Status  New      OT LONG TERM GOAL #4   Title  Pt will achieve 100% compliance with daily LE self-care home program components, including daily  skin care, simple self-MLD, gradient compression wraps/ compression garments/devices, and therapeutic exercise with Mod CG support to ensure optimal Intensive Phase limb volume reduction to expedite compression garment/ device fitting.    Baseline  50% compliant    Time  12    Period  Weeks    Status  Partially Met      OT LONG TERM GOAL #5   Title  Pt will use appropriate  assistive devices during LE self-care training to don compression garments/devices with modified independence to ensure optimal LE self-management over time and to limit progression of chronic LE.    Baseline  min A    Time  12    Period  Weeks    Status  Deferred   DC goal     OT LONG TERM GOAL #6    Title  Pt will retain optimal limb volume reductions achieved during Intensive Phase CDT with no more than 3% volume increase with ongoing CG assistance to limit LE progression and further functional decline.    Baseline  min A    Time  6  Period  Months    Status  New            Plan - 12/29/19 1227    Clinical Impression Statement  Pt demonstrates increased skin flexibility and ankle AROM in dorsiflexion after MLD with fibrosis techniques and active assisted sustained stretching using leg lifter to assist. Increased skin wrinkles visible after treatment. Pt reported improved flexibility. Suggested Pt try wearing double 15-20 mmHg off-the-shelf stockings between visits for added compression and to see if this technique reduces dense high protien  accumulation in tissue, I do not believe Existing compression stockings  provide adequate compression. Cont as per POC.       Patient will benefit from skilled therapeutic intervention in order to improve the following deficits and impairments:           Visit Diagnosis: Lymphedema, not elsewhere classified    Problem List Patient Active Problem List   Diagnosis Date Noted  . Vitamin D deficiency 06/28/2019  . Prediabetes 06/28/2019  . GERD (gastroesophageal reflux disease) 12/07/2018  . Vitamin B12 deficiency 12/05/2018  . Senile purpura ([place]) 07/28/2018  . Chronic venous insufficiency 07/14/2018  . Lymphedema 07/14/2018  . Aneurysm of descending thoracic aorta (HCC) 05/04/2018  . Allergic rhinitis 02/16/2018  . Aortic atherosclerosis ([place]) 01/13/2018  . Status post placement of implantable loop recorder 01/12/2018  . Chronic kidney disease, stage 3 01/12/2018  . Cerebrovascular accident (CVA) due to embolism of precerebral artery ([place])   . TIA (transient ischemic attack) 01/04/2018  . Osteoarthritis of wrist 12/09/2017  . CAD (coronary artery disease) 11/25/2017  . Caregiver stress 11/25/2017  . Menopause 08/14/2017  .  De Quervain's tenosynovitis, right 07/17/2017  . Stress incontinence 06/19/2017  . Personal history of tobacco use, presenting hazards to health 04/01/2017  . Anxiety 03/13/2017  . Glossitis 11/14/2016  . Advanced care planning/counseling discussion 10/17/2016  . Benign neoplasm of sigmoid colon   . COPD (chronic obstructive pulmonary disease) ([place]) 10/17/2015  . Mass of upper inner quadrant of right breast 06/22/2015    Andrey Spearman, MS, OTR/L, [facility] 12/29/19 3:10 PM  [place] MAIN [facility] SERVICES [address] [place], [name], [address] Phone: [phone]   Fax:  [phone]  Name: TYLAH MANCILLAS MRN: [id number] Date of Birth: August 13, 1946

## 2019-12-29 NOTE — Progress Notes (Signed)
ILR Remote 

## 2020-01-02 ENCOUNTER — Encounter: Payer: Medicare Other | Admitting: Occupational Therapy

## 2020-01-02 NOTE — Progress Notes (Signed)
Date:  01/03/2020   ID:  Sharon KIMBLER, DOB [birth year], MRN [id number]  Patient Location:  [name] [address]   Provider location:   [facility], [place] office  PCP:  [name], NP  Cardiologist:  No primary care provider on file.   Chief Complaint  Patient presents with  . office visit    6 month F/U; Meds verbnally reviewed with patient.    History of Present Illness:    Sharon [name] a 74 y.o.femalewith a hx of  asthma/COPD,  cervical cancer,  Anxiety embolic stroke January 04, 2018 Transesophageal echo documenting no left atrial appendage or thrombus Placement of reveal/loop device January 07, 2018 Aortic atherosclerosis Presents for f/uof history of stroke,   Feels well, Problem voice, wonders if she could or should see a speech therapist or have work-up on her speech Feels it is weak, hoarse  Sees teresa gilum for lymphedema Wears compression hose Not using pumps consistently Feels her leg swelling is relatively stable with the addition of lymphedema massage  Sedentary, feels this is secondary to Covid  Echo 2019 Left ventricle: The cavity size was normal. There was mild  concentric hypertrophy. Systolic function was normal. The  estimated ejection fraction was in the range of 55% to 60%.  emergency department January 04, 2018  slurred speech and weakness involving the left hand.  facial droop in the morning, though this had resolved by the time that she presented to the emergency department.  difficulty with balance.    multiple right cerebral infarcts concerning for embolic phenomenon.   Prior CV studies:   The following studies were reviewed today:  Echo  09/2018 - Left ventricle: The cavity size was normal. There was mild   concentric hypertrophy. Systolic function was normal. The   estimated ejection fraction was in the range of 55% to 60%. Wall   motion was normal; there were no regional  wall motion   abnormalities. Doppler parameters are consistent with abnormal   left ventricular relaxation (grade 1 diastolic dysfunction). - Pulmonary arteries: Systolic pressure was at the upper limits of   normal. - Pericardium, extracardiac: A small pericardial effusion was   identified posterior to the heart.   Past Medical History:  Diagnosis Date  . Anxiety   . Arthritis    hands, upper back  . Asthma   . COPD (chronic obstructive pulmonary disease) ([place])   . History of cervical cancer   . Menopausal disorder   . Osteoporosis   . Pneumonia 1960  . Spasm of abdominal muscles of right side    intermittent  . TMJ (dislocation of temporomandibular joint)   . Wears dentures    partial lower   Past Surgical History:  Procedure Laterality Date  . ABDOMINAL HYSTERECTOMY  1970's  . bladder botox  2005  . BLADDER SUSPENSION  2004  . CATARACT EXTRACTION W/PHACO Right 03/09/2018   Procedure: CATARACT EXTRACTION PHACO AND INTRAOCULAR LENS PLACEMENT ([place]) right;  Surgeon: Eulogio Bear, MD;  Location: [place];  Service: Ophthalmology;  Laterality: Right;  CALL CELL 1ST  . CATARACT EXTRACTION W/PHACO Left 04/19/2018   Procedure: CATARACT EXTRACTION PHACO AND INTRAOCULAR LENS PLACEMENT (IOC)  LEFT;  Surgeon: Eulogio Bear, MD;  Location: [place];  Service: Ophthalmology;  Laterality: Left;  . COLONOSCOPY WITH PROPOFOL N/A 12/13/2015   Procedure: COLONOSCOPY WITH PROPOFOL;  Surgeon: Lucilla Lame, MD;  Location: [place];  Service: Endoscopy;  Laterality: N/A;  . LOOP RECORDER INSERTION N/A 01/07/2018   Procedure: LOOP RECORDER INSERTION;  Surgeon: Deboraha Sprang, MD;  Location: [place] [facility];  Service: Cardiovascular;  Laterality: N/A;  . POLYPECTOMY N/A 12/13/2015   Procedure: POLYPECTOMY;  Surgeon: Lucilla Lame, MD;  Location: [place];  Service: Endoscopy;  Laterality: N/A;  SIGMOID COLON POLYPS X  5  . SHOULDER ARTHROSCOPY W/  ROTATOR CUFF REPAIR Right 1998  . TEE WITHOUT CARDIOVERSION N/A 01/06/2018   Procedure: TRANSESOPHAGEAL ECHOCARDIOGRAM (TEE);  Surgeon: Minna Merritts, MD;  Location: [facility];  Service: Cardiovascular;  Laterality: N/A;  . TONSILLECTOMY AND ADENOIDECTOMY       Current Meds  Medication Sig  . albuterol (PROAIR HFA) 108 (90 Base) MCG/ACT inhaler Inhale 2 puffs into the lungs every 6 (six) hours as needed for wheezing or shortness of breath.  Marland Kitchen atorvastatin (LIPITOR) 40 MG tablet TAKE 1 TABLET BY MOUTH ONCE A DAY AT 6 PM  . budesonide-formoterol (SYMBICORT) 160-4.5 MCG/ACT inhaler USE 2 PUFFS BY MOUTH TWICE DAILY.  . citalopram (CELEXA) 20 MG tablet Take 1 tablet (20 mg total) by mouth daily.  . clopidogrel (PLAVIX) 75 MG tablet TAKE 1 TABLET(75 MG) BY MOUTH DAILY  . Cyanocobalamin 1000 MCG/ML KIT Inject 1,000 mcg as directed every 30 (thirty) days.  Marland Kitchen estradiol (VIVELLE-DOT) 0.1 MG/24HR patch APPLY 1 PATCH ONTO THE SKIN 2 TIMES A WEEK  . Loratadine (CLARITIN PO) Take by mouth daily.  . meclizine (ANTIVERT) 12.5 MG tablet Take 1 tablet (12.5 mg total) by mouth 3 (three) times daily as needed for dizziness (only if needed).  . montelukast (SINGULAIR) 10 MG tablet Take 1 tablet (10 mg total) by mouth at bedtime.  . Multiple Vitamin (MULTIVITAMIN) tablet Take 2 tablets by mouth daily.   . pantoprazole (PROTONIX) 40 MG tablet Take 1 tablet (40 mg total) by mouth daily.  . penicillin v potassium (VEETID) 250 MG tablet Take 1 tablet (250 mg total) by mouth 2 (two) times daily.  Marland Kitchen triamcinolone cream (KENALOG) 0.1 % Apply 1 application topically 2 (two) times daily.     Allergies:   Baclofen, Bactrim [sulfamethoxazole-trimethoprim], Ibuprofen, Latex, Librium [chlordiazepoxide], Naprosyn [naproxen], and Other   Social History   Tobacco Use  . Smoking status: Former Smoker    Years: 56.00    Types: Cigarettes    Quit date: 07/14/2016    Years since quitting: 3.4  . Smokeless tobacco: Never  Used  Substance Use Topics  . Alcohol use: Yes    Alcohol/week: 0.0 standard drinks  . Drug use: No     Current Outpatient Medications on File Prior to Visit  Medication Sig Dispense Refill  . albuterol (PROAIR HFA) 108 (90 Base) MCG/ACT inhaler Inhale 2 puffs into the lungs every 6 (six) hours as needed for wheezing or shortness of breath. 8.5 Inhaler 12  . atorvastatin (LIPITOR) 40 MG tablet TAKE 1 TABLET BY MOUTH ONCE A DAY AT 6 PM 90 tablet 3  . budesonide-formoterol (SYMBICORT) 160-4.5 MCG/ACT inhaler USE 2 PUFFS BY MOUTH TWICE DAILY. 10.2 g 6  . citalopram (CELEXA) 20 MG tablet Take 1 tablet (20 mg total) by mouth daily. 90 tablet 3  . clopidogrel (PLAVIX) 75 MG tablet TAKE 1 TABLET(75 MG) BY MOUTH DAILY 90 tablet 3  . Cyanocobalamin 1000 MCG/ML KIT Inject 1,000 mcg as directed every 30 (thirty) days.    Marland Kitchen estradiol (VIVELLE-DOT) 0.1 MG/24HR patch APPLY 1 PATCH ONTO THE SKIN 2 TIMES  A WEEK 8 patch 11  . Loratadine (CLARITIN PO) Take by mouth daily.    . meclizine (ANTIVERT) 12.5 MG tablet Take 1 tablet (12.5 mg total) by mouth 3 (three) times daily as needed for dizziness (only if needed). 30 tablet 0  . montelukast (SINGULAIR) 10 MG tablet Take 1 tablet (10 mg total) by mouth at bedtime. 90 tablet 3  . Multiple Vitamin (MULTIVITAMIN) tablet Take 2 tablets by mouth daily.     . pantoprazole (PROTONIX) 40 MG tablet Take 1 tablet (40 mg total) by mouth daily. 90 tablet 3  . penicillin v potassium (VEETID) 250 MG tablet Take 1 tablet (250 mg total) by mouth 2 (two) times daily. 60 tablet 3  . triamcinolone cream (KENALOG) 0.1 % Apply 1 application topically 2 (two) times daily. 30 g 0  . benzonatate (TESSALON PERLES) 100 MG capsule Take 1 capsule (100 mg total) by mouth 3 (three) times daily as needed for cough. (Patient not taking: Reported on 11/10/2019) 45 capsule 0   No current facility-administered medications on file prior to visit.     Family Hx: The patient's family history  includes Diabetes in her mother; Heart disease in her mother; Stroke in her maternal grandmother and mother.  ROS:   Please see the history of present illness.    Review of Systems  Constitutional: Negative.        Hoarse voice  HENT: Negative.   Respiratory: Negative.   Cardiovascular: Positive for leg swelling.  Gastrointestinal: Negative.   Musculoskeletal: Negative.   Skin: Negative.   Neurological: Negative.   Psychiatric/Behavioral: Negative.   All other systems reviewed and are negative.    Labs/Other Tests and Data Reviewed:    Recent Labs: 06/28/2019: ALT 21; BUN 13; Creatinine, Ser 1.10; Hemoglobin 14.4; Platelets 250; Potassium 4.5; Sodium 141; TSH 1.120   Recent Lipid Panel Lab Results  Component Value Date/Time   CHOL 145 06/28/2019 01:55 PM   TRIG 79 06/28/2019 01:55 PM   HDL 60 06/28/2019 01:55 PM   CHOLHDL 2.9 01/08/2018 04:06 AM   LDLCALC 70 06/28/2019 01:55 PM    Wt Readings from Last 3 Encounters:  01/03/20 219 lb (99.3 kg)  11/10/19 219 lb (99.3 kg)  10/25/19 221 lb (100.2 kg)     Exam:   BP 120/80 (BP Location: Left Arm, Patient Position: Sitting, Cuff Size: Normal)   Pulse 95   Ht 5' 9"  (1.753 m)   Wt 219 lb (99.3 kg)   LMP  (LMP Unknown)   SpO2 97%   BMI 32.34 kg/m  Constitutional:  oriented to person, place, and time. No distress.  HENT:  Head: Grossly normal Eyes:  no discharge. No scleral icterus.  Neck: No JVD, no carotid bruits  Cardiovascular: Regular rate and rhythm, no murmurs appreciated Pulmonary/Chest: Clear to auscultation bilaterally, no wheezes or rails Abdominal: Soft.  no distension.  no tenderness.  Musculoskeletal: Normal range of motion Neurological:  normal muscle tone. Coordination normal. No atrophy Skin: Skin warm and dry Psychiatric: normal affect, pleasant  ASSESSMENT & PLAN:    Aortic atherosclerosis (HCC) On aspirin Plavix with significant bruising Prior stroke history No recent lipid panel available    cerebrovascular accident (CVA), unspecified mechanism ([place])  loop monitor downloads showing no significant arrhythmia Continues to take Plavix daily  Shortness of breath Recommended regular walking program, diet restriction  Need to work on conditioning and leg strengthening  Chronic obstructive pulmonary disease, unspecified COPD type ([place]) Denies significant change in her  breathing  Atherosclerosis of native coronary artery of native heart without angina pectoris Currently with no symptoms of angina. No further workup at this time. Continue current medication regimen.  Aneurysm of descending thoracic aorta (HCC)  periodic surveillance Recent CT scan in October 2020 descending aorta 4.3 cm, stable  Lymphedema Has lymphedema massage, wears compression hose Recommend she continue to use her lymphedema compression pumps daily   Disposition: Follow-up in 6 months   Signed, Ida Rogue, MD  01/03/2020 6:16 PM    [place] Office [address] [place] [address], [place], [place] [address]

## 2020-01-03 ENCOUNTER — Encounter: Payer: Medicare Other | Admitting: Occupational Therapy

## 2020-01-03 ENCOUNTER — Other Ambulatory Visit: Payer: Self-pay

## 2020-01-03 ENCOUNTER — Ambulatory Visit (INDEPENDENT_AMBULATORY_CARE_PROVIDER_SITE_OTHER): Payer: Medicare Other | Admitting: Cardiovascular Disease

## 2020-01-03 ENCOUNTER — Encounter: Payer: Self-pay | Admitting: Cardiovascular Disease

## 2020-01-03 VITALS — BP 120/80 | HR 95 | Ht 69.0 in | Wt 219.0 lb

## 2020-01-03 DIAGNOSIS — I700 Atherosclerosis of aorta: Secondary | ICD-10-CM | POA: Diagnosis not present

## 2020-01-03 DIAGNOSIS — I639 Cerebral infarction, unspecified: Secondary | ICD-10-CM

## 2020-01-03 DIAGNOSIS — I712 Thoracic aortic aneurysm, without rupture: Secondary | ICD-10-CM

## 2020-01-03 DIAGNOSIS — I2510 Atherosclerotic heart disease of native coronary artery without angina pectoris: Secondary | ICD-10-CM | POA: Diagnosis not present

## 2020-01-03 DIAGNOSIS — R0602 Shortness of breath: Secondary | ICD-10-CM | POA: Diagnosis not present

## 2020-01-03 DIAGNOSIS — I7123 Aneurysm of the descending thoracic aorta, without rupture: Secondary | ICD-10-CM

## 2020-01-03 NOTE — Patient Instructions (Addendum)
Speech therapy:  Post CVA [phone]  Silver sneakers here at [facility]  Medication Instructions:  No changes  If you need a refill on your cardiac medications before your next appointment, please call your pharmacy.    Lab work: No new labs needed   If you have labs (blood work) drawn today and your tests are completely normal, you will receive your results only by: Marland Kitchen MyChart Message (if you have MyChart) OR . A paper copy in the mail If you have any lab test that is abnormal or we need to change your treatment, we will call you to review the results.   Testing/Procedures: No new testing needed   Follow-Up: At [facility] you and your health needs are our priority.  As part of our continuing mission to provide you with exceptional heart care, we have created designated Provider Care Teams.  These Care Teams include your primary Cardiologist (physician) and Advanced Practice Providers (APPs -  Physician Assistants and Nurse Practitioners) who all work together to provide you with the care you need, when you need it.  . You will need a follow up appointment in 12 months   . Providers on your designated Care Team:   . Murray Hodgkins, NP . Christell Faith, PA-C . Marrianne Mood, PA-C  Any Other Special Instructions Will Be Listed Below (If Applicable).  For educational health videos Log in to : [email or web address] Or : [email or web address], password : [email or web address]

## 2020-01-05 ENCOUNTER — Ambulatory Visit: Payer: Medicare Other | Admitting: Occupational Therapy

## 2020-01-05 ENCOUNTER — Other Ambulatory Visit: Payer: Self-pay

## 2020-01-05 DIAGNOSIS — I890 Lymphedema, not elsewhere classified: Secondary | ICD-10-CM | POA: Diagnosis not present

## 2020-01-05 NOTE — Therapy (Signed)
[place] MAIN [facility] SERVICES [address] [place], [name], [address] Phone: [phone]   Fax:  [phone]  Occupational Therapy Treatment  Patient Details  Name: Sharon [name] MRN: [id number] Date of Birth: 28-Apr-1946 Referring Provider (OT): Eulogio Ditch, NP   Encounter Date: 01/05/2020  OT End of Session - 01/05/20 1619    Visit Number  12    Number of Visits  36    Date for OT Re-Evaluation  02/12/20    Authorization - Visit Number  3    Authorization - Number of Visits  36    OT Start Time  0100    OT Stop Time  0200    OT Time Calculation (min)  60 min    Activity Tolerance  Patient tolerated treatment well;No increased pain    Behavior During Therapy  WFL for tasks assessed/performed       Past Medical History:  Diagnosis Date  . Anxiety   . Arthritis    hands, upper back  . Asthma   . COPD (chronic obstructive pulmonary disease) ([place])   . History of cervical cancer   . Menopausal disorder   . Osteoporosis   . Pneumonia 1960  . Spasm of abdominal muscles of right side    intermittent  . TMJ (dislocation of temporomandibular joint)   . Wears dentures    partial lower    Past Surgical History:  Procedure Laterality Date  . ABDOMINAL HYSTERECTOMY  1970's  . bladder botox  2005  . BLADDER SUSPENSION  2004  . CATARACT EXTRACTION W/PHACO Right 03/09/2018   Procedure: CATARACT EXTRACTION PHACO AND INTRAOCULAR LENS PLACEMENT ([place]) right;  Surgeon: Eulogio Bear, MD;  Location: [place];  Service: Ophthalmology;  Laterality: Right;  CALL CELL 1ST  . CATARACT EXTRACTION W/PHACO Left 04/19/2018   Procedure: CATARACT EXTRACTION PHACO AND INTRAOCULAR LENS PLACEMENT (IOC)  LEFT;  Surgeon: Eulogio Bear, MD;  Location: [place];  Service: Ophthalmology;  Laterality: Left;  . COLONOSCOPY WITH PROPOFOL N/A 12/13/2015   Procedure: COLONOSCOPY WITH PROPOFOL;  Surgeon: Lucilla Lame, MD;  Location: [place];  Service: Endoscopy;  Laterality: N/A;  . LOOP RECORDER INSERTION N/A 01/07/2018   Procedure: LOOP RECORDER INSERTION;  Surgeon: Deboraha Sprang, MD;  Location: [place] [facility];  Service: Cardiovascular;  Laterality: N/A;  . POLYPECTOMY N/A 12/13/2015   Procedure: POLYPECTOMY;  Surgeon: Lucilla Lame, MD;  Location: [place];  Service: Endoscopy;  Laterality: N/A;  SIGMOID COLON POLYPS X  5  . SHOULDER ARTHROSCOPY W/ ROTATOR CUFF REPAIR Right 1998  . TEE WITHOUT CARDIOVERSION N/A 01/06/2018   Procedure: TRANSESOPHAGEAL ECHOCARDIOGRAM (TEE);  Surgeon: Minna Merritts, MD;  Location: [facility];  Service: Cardiovascular;  Laterality: N/A;  . TONSILLECTOMY AND ADENOIDECTOMY      There were no vitals filed for this visit.  Subjective Assessment - 01/05/20 1307    Subjective   [name] for OT visit 11 of 36 to address BLE Lymphedema. Pt reports R ankle and leg pain at 5/10 and L ankle/leg pain at 3/10.                           OT Education - 01/05/20 1619    Education Details  Continued skilled Pt/caregiver education  And LE ADL training throughout visit for lymphedema self care/ home program, including compression wrapping, compression garment and device wear/care, lymphatic pumping ther  ex, simple self-MLD, and skin care. Discussed progress towards goals.    Person(s) Educated  Patient    Methods  Explanation;Demonstration    Comprehension  Verbalized understanding;Returned demonstration          OT Long Term Goals - 11/22/19 0955      OT LONG TERM GOAL #1   Title  Pt will demonstrate understanding of lymphedema (LE) precautions / prevention principals, including signs / symptoms of cellulitis infection with modified independence using LE Workbook as printed reference to identify 6 precautions without verbal cues by end of 3rd  OT Rx visit.     Baseline  erbal cues    Time  3    Period  Days    Status  Achieved      OT LONG  TERM GOAL #2   Title  Pt will be able todon and doff existing compression garments independently and without assistive devices and/ or extra time for optimal lymphedema self management over time and to limit infection risk.    Baseline  supervision    Time  4    Period  Days    Status  Achieved      OT LONG TERM GOAL #3   Title  Pt to achieve at least  10% limb volume reduction in affected limb(s)  bilaterally during Intensive Phase CDT to control limb swelling, to improve tissue integrity and immune function, to improve ADLs performance and to improve functional mobility/ transfer, and to improve body image and self-esteem.    Baseline  max A    Time  8    Period  Weeks    Status  New      OT LONG TERM GOAL #4   Title  Pt will achieve 100% compliance with daily LE self-care home program components, including daily  skin care, simple self-MLD, gradient compression wraps/ compression garments/devices, and therapeutic exercise with Mod CG support to ensure optimal Intensive Phase limb volume reduction to expedite compression garment/ device fitting.    Baseline  50% compliant    Time  12    Period  Weeks    Status  Partially Met      OT LONG TERM GOAL #5   Title  Pt will use appropriate  assistive devices during LE self-care training to don compression garments/devices with modified independence to ensure optimal LE self-management over time and to limit progression of chronic LE.    Baseline  min A    Time  12    Period  Weeks    Status  Deferred   DC goal     OT LONG TERM GOAL #6   Title  Pt will retain optimal limb volume reductions achieved during Intensive Phase CDT with no more than 3% volume increase with ongoing CG assistance to limit LE progression and further functional decline.    Baseline  min A    Time  6    Period  Months    Status  New            Plan - 01/05/20 1620    Clinical Impression Statement  Emphasis of session again today on decreasing issue fibrosis  to improve skin flexibility for improved bilateral dorsiflexion to improve safety. Pt tolerated deep tissue fibrosis massage to LLE without difficulty. Had time to also provide RLE MLD  today. Happy to see that Pt retained improved R ankle AROM after last session. She also obtained recommended leg lifter and is using it at  home frequently each evening to strech achilles    for 3-5 mintes at a time (during tv commercials with legs elevated. ) Cont as per POC.       Patient will benefit from skilled therapeutic intervention in order to improve the following deficits and impairments:           Visit Diagnosis: Lymphedema, not elsewhere classified    Problem List Patient Active Problem List   Diagnosis Date Noted  . Vitamin D deficiency 06/28/2019  . Prediabetes 06/28/2019  . GERD (gastroesophageal reflux disease) 12/07/2018  . Vitamin B12 deficiency 12/05/2018  . Senile purpura ([place]) 07/28/2018  . Chronic venous insufficiency 07/14/2018  . Lymphedema 07/14/2018  . Aneurysm of descending thoracic aorta (HCC) 05/04/2018  . Allergic rhinitis 02/16/2018  . Aortic atherosclerosis ([place]) 01/13/2018  . Status post placement of implantable loop recorder 01/12/2018  . Chronic kidney disease, stage 3 01/12/2018  . Cerebrovascular accident (CVA) due to embolism of precerebral artery ([place])   . TIA (transient ischemic attack) 01/04/2018  . Osteoarthritis of wrist 12/09/2017  . CAD (coronary artery disease) 11/25/2017  . Caregiver stress 11/25/2017  . Menopause 08/14/2017  . De Quervain's tenosynovitis, right 07/17/2017  . Stress incontinence 06/19/2017  . Personal history of tobacco use, presenting hazards to health 04/01/2017  . Anxiety 03/13/2017  . Glossitis 11/14/2016  . Advanced care planning/counseling discussion 10/17/2016  . Benign neoplasm of sigmoid colon   . COPD (chronic obstructive pulmonary disease) ([place]) 10/17/2015  . Mass of upper inner quadrant of right breast 06/22/2015     Andrey Spearman, MS, OTR/L, [facility] 01/05/20 4:24 PM  [place] MAIN [facility] SERVICES [address] [place], [name], [address] Phone: [phone]   Fax:  [phone]  Name: Sharon [name] MRN: [id number] Date of Birth: 04/12/1946

## 2020-01-10 ENCOUNTER — Ambulatory Visit: Payer: Medicare Other | Admitting: Occupational Therapy

## 2020-01-10 ENCOUNTER — Other Ambulatory Visit: Payer: Self-pay

## 2020-01-10 DIAGNOSIS — I890 Lymphedema, not elsewhere classified: Secondary | ICD-10-CM | POA: Diagnosis not present

## 2020-01-11 NOTE — Therapy (Signed)
[place] MAIN [facility] SERVICES [address] [place], [name], [address] Phone: [phone]   Fax:  [phone]  Occupational Therapy Treatment  Patient Details  Name: Sharon [name] MRN: [id number] Date of Birth: 15-Mar-1946 Referring Provider (OT): Eulogio Ditch, NP   Encounter Date: 01/10/2020  OT End of Session - 01/10/20 0853    Visit Number  13    Number of Visits  36    Date for OT Re-Evaluation  02/12/20    Authorization - Visit Number  3    Authorization - Number of Visits  86    OT Start Time  0100    OT Stop Time  0210    OT Time Calculation (min)  70 min    Activity Tolerance  Patient tolerated treatment well;No increased pain    Behavior During Therapy  WFL for tasks assessed/performed       Past Medical History:  Diagnosis Date  . Anxiety   . Arthritis    hands, upper back  . Asthma   . COPD (chronic obstructive pulmonary disease) ([place])   . History of cervical cancer   . Menopausal disorder   . Osteoporosis   . Pneumonia 1960  . Spasm of abdominal muscles of right side    intermittent  . TMJ (dislocation of temporomandibular joint)   . Wears dentures    partial lower    Past Surgical History:  Procedure Laterality Date  . ABDOMINAL HYSTERECTOMY  1970's  . bladder botox  2005  . BLADDER SUSPENSION  2004  . CATARACT EXTRACTION W/PHACO Right 03/09/2018   Procedure: CATARACT EXTRACTION PHACO AND INTRAOCULAR LENS PLACEMENT ([place]) right;  Surgeon: Eulogio Bear, MD;  Location: [place];  Service: Ophthalmology;  Laterality: Right;  CALL CELL 1ST  . CATARACT EXTRACTION W/PHACO Left 04/19/2018   Procedure: CATARACT EXTRACTION PHACO AND INTRAOCULAR LENS PLACEMENT (IOC)  LEFT;  Surgeon: Eulogio Bear, MD;  Location: [place];  Service: Ophthalmology;  Laterality: Left;  . COLONOSCOPY WITH PROPOFOL N/A 12/13/2015   Procedure: COLONOSCOPY WITH PROPOFOL;  Surgeon: Lucilla Lame, MD;  Location: [place];  Service: Endoscopy;  Laterality: N/A;  . LOOP RECORDER INSERTION N/A 01/07/2018   Procedure: LOOP RECORDER INSERTION;  Surgeon: Deboraha Sprang, MD;  Location: [place] [facility];  Service: Cardiovascular;  Laterality: N/A;  . POLYPECTOMY N/A 12/13/2015   Procedure: POLYPECTOMY;  Surgeon: Lucilla Lame, MD;  Location: [place];  Service: Endoscopy;  Laterality: N/A;  SIGMOID COLON POLYPS X  5  . SHOULDER ARTHROSCOPY W/ ROTATOR CUFF REPAIR Right 1998  . TEE WITHOUT CARDIOVERSION N/A 01/06/2018   Procedure: TRANSESOPHAGEAL ECHOCARDIOGRAM (TEE);  Surgeon: Minna Merritts, MD;  Location: [facility];  Service: Cardiovascular;  Laterality: N/A;  . TONSILLECTOMY AND ADENOIDECTOMY      There were no vitals filed for this visit.                OT Treatments/Exercises (OP) - 01/11/20 0001      ADLs   ADL Education Given  Yes      Manual Therapy   Manual Therapy  Edema management;Manual Lymphatic Drainage (MLD);Soft tissue mobilization    Manual therapy comments  skin care w/ low ph castor oil throughout MLD to increase skin hydration and tissue mobuility/flexibility.    Soft tissue mobilization  fibrosis   techniques to bilateral ankles    Manual Lymphatic Drainage (MLD)  MLD ro BLE using short neck, abdominal  pathways and functional inguinal LN,. Fibrosis techniques to ankles and feet bilaterally    Compression Bandaging  Pt dons         compression socks independently after session.             OT Education - 01/11/20 0855    Education Details  Continued skilled Pt/caregiver education  And LE ADL training throughout visit for lymphedema self care/ home program, including compression wrapping, compression garment and device wear/care, lymphatic pumping ther ex, simple self-MLD, and skin care. Discussed progress towards goals.    Person(s) Educated  Patient    Methods  Explanation;Demonstration    Comprehension  Verbalized understanding;Returned  demonstration          OT Long Term Goals - 11/22/19 0955      OT LONG TERM GOAL #1   Title  Pt will demonstrate understanding of lymphedema (LE) precautions / prevention principals, including signs / symptoms of cellulitis infection with modified independence using LE Workbook as printed reference to identify 6 precautions without verbal cues by end of 3rd  OT Rx visit.     Baseline  erbal cues    Time  3    Period  Days    Status  Achieved      OT LONG TERM GOAL #2   Title  Pt will be able todon and doff existing compression garments independently and without assistive devices and/ or extra time for optimal lymphedema self management over time and to limit infection risk.    Baseline  supervision    Time  4    Period  Days    Status  Achieved      OT LONG TERM GOAL #3   Title  Pt to achieve at least  10% limb volume reduction in affected limb(s)  bilaterally during Intensive Phase CDT to control limb swelling, to improve tissue integrity and immune function, to improve ADLs performance and to improve functional mobility/ transfer, and to improve body image and self-esteem.    Baseline  max A    Time  8    Period  Weeks    Status  New      OT LONG TERM GOAL #4   Title  Pt will achieve 100% compliance with daily LE self-care home program components, including daily  skin care, simple self-MLD, gradient compression wraps/ compression garments/devices, and therapeutic exercise with Mod CG support to ensure optimal Intensive Phase limb volume reduction to expedite compression garment/ device fitting.    Baseline  50% compliant    Time  12    Period  Weeks    Status  Partially Met      OT LONG TERM GOAL #5   Title  Pt will use appropriate  assistive devices during LE self-care training to don compression garments/devices with modified independence to ensure optimal LE self-management over time and to limit progression of chronic LE.    Baseline  min A    Time  12    Period  Weeks     Status  Deferred   DC goal     OT LONG TERM GOAL #6   Title  Pt will retain optimal limb volume reductions achieved during Intensive Phase CDT with no more than 3% volume increase with ongoing CG assistance to limit LE progression and further functional decline.    Baseline  min A    Time  6    Period  Months    Status  New  Plan - 01/10/20 0856    Clinical Impression Statement  Pt continues to report decreased tissue tightness and improved skin and joint mobility at bilateral ankles after manual therapy. Continued today with MLD and fibrosis and scar techniques with efforts concentrated on RLE primarily. Pt able to dff and don compression garments with modified independence before and after Rx. Cont as per POC.       Patient will benefit from skilled therapeutic intervention in order to improve the following deficits and impairments:           Visit Diagnosis: Lymphedema, not elsewhere classified    Problem List Patient Active Problem List   Diagnosis Date Noted  . Vitamin D deficiency 06/28/2019  . Prediabetes 06/28/2019  . GERD (gastroesophageal reflux disease) 12/07/2018  . Vitamin B12 deficiency 12/05/2018  . Senile purpura ([place]) 07/28/2018  . Chronic venous insufficiency 07/14/2018  . Lymphedema 07/14/2018  . Aneurysm of descending thoracic aorta (HCC) 05/04/2018  . Allergic rhinitis 02/16/2018  . Aortic atherosclerosis ([place]) 01/13/2018  . Status post placement of implantable loop recorder 01/12/2018  . Chronic kidney disease, stage 3 01/12/2018  . Cerebrovascular accident (CVA) due to embolism of precerebral artery ([place])   . TIA (transient ischemic attack) 01/04/2018  . Osteoarthritis of wrist 12/09/2017  . CAD (coronary artery disease) 11/25/2017  . Caregiver stress 11/25/2017  . Menopause 08/14/2017  . De Quervain's tenosynovitis, right 07/17/2017  . Stress incontinence 06/19/2017  . Personal history of tobacco use, presenting hazards to  health 04/01/2017  . Anxiety 03/13/2017  . Glossitis 11/14/2016  . Advanced care planning/counseling discussion 10/17/2016  . Benign neoplasm of sigmoid colon   . COPD (chronic obstructive pulmonary disease) ([place]) 10/17/2015  . Mass of upper inner quadrant of right breast 06/22/2015    Andrey Spearman, MS, OTR/L, [facility] 01/11/20 8:59 AM  [place] MAIN [facility] SERVICES [address] [place], [name], [address] Phone: [phone]   Fax:  [phone]  Name: AGATHA DUPLECHAIN MRN: [id number] Date of Birth: 1946-05-14

## 2020-01-12 ENCOUNTER — Ambulatory Visit: Payer: Medicare Other | Attending: Nurse Practitioner | Admitting: Occupational Therapy

## 2020-01-12 ENCOUNTER — Other Ambulatory Visit: Payer: Self-pay

## 2020-01-12 DIAGNOSIS — I890 Lymphedema, not elsewhere classified: Secondary | ICD-10-CM | POA: Diagnosis not present

## 2020-01-12 NOTE — Therapy (Signed)
[place] MAIN [facility] SERVICES [address] [place], [name], [address] Phone: [phone]   Fax:  [phone]  Occupational Therapy Treatment  Patient Details  Name: Sharon [name] MRN: [id number] Date of Birth: 06-04-1946 Referring Provider (OT): Eulogio Ditch, NP   Encounter Date: 01/12/2020  OT End of Session - 01/12/20 1356    Visit Number  14    Number of Visits  36    Date for OT Re-Evaluation  02/12/20    Authorization - Visit Number  3    Authorization - Number of Visits  36    Activity Tolerance  Patient tolerated treatment well;No increased pain    Behavior During Therapy  WFL for tasks assessed/performed       Past Medical History:  Diagnosis Date  . Anxiety   . Arthritis    hands, upper back  . Asthma   . COPD (chronic obstructive pulmonary disease) ([place])   . History of cervical cancer   . Menopausal disorder   . Osteoporosis   . Pneumonia 1960  . Spasm of abdominal muscles of right side    intermittent  . TMJ (dislocation of temporomandibular joint)   . Wears dentures    partial lower    Past Surgical History:  Procedure Laterality Date  . ABDOMINAL HYSTERECTOMY  1970's  . bladder botox  2005  . BLADDER SUSPENSION  2004  . CATARACT EXTRACTION W/PHACO Right 03/09/2018   Procedure: CATARACT EXTRACTION PHACO AND INTRAOCULAR LENS PLACEMENT ([place]) right;  Surgeon: Eulogio Bear, MD;  Location: [place];  Service: Ophthalmology;  Laterality: Right;  CALL CELL 1ST  . CATARACT EXTRACTION W/PHACO Left 04/19/2018   Procedure: CATARACT EXTRACTION PHACO AND INTRAOCULAR LENS PLACEMENT (IOC)  LEFT;  Surgeon: Eulogio Bear, MD;  Location: [place];  Service: Ophthalmology;  Laterality: Left;  . COLONOSCOPY WITH PROPOFOL N/A 12/13/2015   Procedure: COLONOSCOPY WITH PROPOFOL;  Surgeon: Lucilla Lame, MD;  Location: [place];  Service: Endoscopy;  Laterality: N/A;  . LOOP RECORDER INSERTION N/A  01/07/2018   Procedure: LOOP RECORDER INSERTION;  Surgeon: Deboraha Sprang, MD;  Location: [place] [facility];  Service: Cardiovascular;  Laterality: N/A;  . POLYPECTOMY N/A 12/13/2015   Procedure: POLYPECTOMY;  Surgeon: Lucilla Lame, MD;  Location: [place];  Service: Endoscopy;  Laterality: N/A;  SIGMOID COLON POLYPS X  5  . SHOULDER ARTHROSCOPY W/ ROTATOR CUFF REPAIR Right 1998  . TEE WITHOUT CARDIOVERSION N/A 01/06/2018   Procedure: TRANSESOPHAGEAL ECHOCARDIOGRAM (TEE);  Surgeon: Minna Merritts, MD;  Location: [facility];  Service: Cardiovascular;  Laterality: N/A;  . TONSILLECTOMY AND ADENOIDECTOMY      There were no vitals filed for this visit.  Subjective Assessment - 01/12/20 1257    Subjective   Mrs Sadira Standard for OT visit 14 of 36 to address BLE Lymphedema. Pt reports , "My legs stung all day yesterday and they kept me awake last night."                   OT Treatments/Exercises (OP) - 01/12/20 0001      ADLs   ADL Education Given  Yes      Manual Therapy   Manual Therapy  Edema management;Manual Lymphatic Drainage (MLD);Compression Bandaging    Manual therapy comments  skin care w/ low ph castor oil throughout MLD to increase skin hydration and tissue mobuility/flexibility.    Soft tissue mobilization  fibrosis   techniques to  bilateral ankles    Manual Lymphatic Drainage (MLD)  MLD ro BLE using short neck, abdominal pathways and functional inguinal LN,. Fibrosis techniques to ankles and feet bilaterally    Compression Bandaging  Pt dons         compression socks independently after session.                  OT Long Term Goals - 11/22/19 0955      OT LONG TERM GOAL #1   Title  Pt will demonstrate understanding of lymphedema (LE) precautions / prevention principals, including signs / symptoms of cellulitis infection with modified independence using LE Workbook as printed reference to identify 6 precautions without verbal cues by end of  3rd  OT Rx visit.     Baseline  erbal cues    Time  3    Period  Days    Status  Achieved      OT LONG TERM GOAL #2   Title  Pt will be able todon and doff existing compression garments independently and without assistive devices and/ or extra time for optimal lymphedema self management over time and to limit infection risk.    Baseline  supervision    Time  4    Period  Days    Status  Achieved      OT LONG TERM GOAL #3   Title  Pt to achieve at least  10% limb volume reduction in affected limb(s)  bilaterally during Intensive Phase CDT to control limb swelling, to improve tissue integrity and immune function, to improve ADLs performance and to improve functional mobility/ transfer, and to improve body image and self-esteem.    Baseline  max A    Time  8    Period  Weeks    Status  New      OT LONG TERM GOAL #4   Title  Pt will achieve 100% compliance with daily LE self-care home program components, including daily  skin care, simple self-MLD, gradient compression wraps/ compression garments/devices, and therapeutic exercise with Mod CG support to ensure optimal Intensive Phase limb volume reduction to expedite compression garment/ device fitting.    Baseline  50% compliant    Time  12    Period  Weeks    Status  Partially Met      OT LONG TERM GOAL #5   Title  Pt will use appropriate  assistive devices during LE self-care training to don compression garments/devices with modified independence to ensure optimal LE self-management over time and to limit progression of chronic LE.    Baseline  min A    Time  12    Period  Weeks    Status  Deferred   DC goal     OT LONG TERM GOAL #6   Title  Pt will retain optimal limb volume reductions achieved during Intensive Phase CDT with no more than 3% volume increase with ongoing CG assistance to limit LE progression and further functional decline.    Baseline  min A    Time  6    Period  Months    Status  New            Plan -  01/12/20 1356    Clinical Impression Statement  Pt tolerated MLD with deep bliateral fibrosis techniques. Pt c/o pain when standing after manual therapy . Cont as per POC.       Patient will benefit from skilled therapeutic intervention in order  to improve the following deficits and impairments:           Visit Diagnosis: Lymphedema, not elsewhere classified    Problem List Patient Active Problem List   Diagnosis Date Noted  . Vitamin D deficiency 06/28/2019  . Prediabetes 06/28/2019  . GERD (gastroesophageal reflux disease) 12/07/2018  . Vitamin B12 deficiency 12/05/2018  . Senile purpura ([place]) 07/28/2018  . Chronic venous insufficiency 07/14/2018  . Lymphedema 07/14/2018  . Aneurysm of descending thoracic aorta (HCC) 05/04/2018  . Allergic rhinitis 02/16/2018  . Aortic atherosclerosis ([place]) 01/13/2018  . Status post placement of implantable loop recorder 01/12/2018  . Chronic kidney disease, stage 3 01/12/2018  . Cerebrovascular accident (CVA) due to embolism of precerebral artery ([place])   . TIA (transient ischemic attack) 01/04/2018  . Osteoarthritis of wrist 12/09/2017  . CAD (coronary artery disease) 11/25/2017  . Caregiver stress 11/25/2017  . Menopause 08/14/2017  . De Quervain's tenosynovitis, right 07/17/2017  . Stress incontinence 06/19/2017  . Personal history of tobacco use, presenting hazards to health 04/01/2017  . Anxiety 03/13/2017  . Glossitis 11/14/2016  . Advanced care planning/counseling discussion 10/17/2016  . Benign neoplasm of sigmoid colon   . COPD (chronic obstructive pulmonary disease) ([place]) 10/17/2015  . Mass of upper inner quadrant of right breast 06/22/2015    Andrey Spearman, MS, OTR/L, [facility] 01/12/20 2:01 PM  [place] MAIN [facility] SERVICES [address] [place], [name], [address] Phone: [phone]   Fax:  [phone]  Name: Sharon [name] MRN: [id number] Date of Birth: July 07, 1946

## 2020-01-17 ENCOUNTER — Encounter: Payer: Medicare Other | Admitting: Occupational Therapy

## 2020-01-18 ENCOUNTER — Encounter: Payer: Self-pay | Admitting: Nurse Practitioner

## 2020-01-18 ENCOUNTER — Other Ambulatory Visit: Payer: Self-pay | Admitting: Nurse Practitioner

## 2020-01-18 ENCOUNTER — Ambulatory Visit: Payer: Medicare Other | Admitting: Occupational Therapy

## 2020-01-18 MED ORDER — BUDESONIDE-FORMOTEROL FUMARATE 160-4.5 MCG/ACT IN AERO: INHALATION_SPRAY | RESPIRATORY_TRACT | 12 refills | Status: DC

## 2020-01-19 ENCOUNTER — Ambulatory Visit (INDEPENDENT_AMBULATORY_CARE_PROVIDER_SITE_OTHER): Payer: Medicare Other

## 2020-01-19 ENCOUNTER — Other Ambulatory Visit: Payer: Self-pay

## 2020-01-19 DIAGNOSIS — E538 Deficiency of other specified B group vitamins: Secondary | ICD-10-CM | POA: Diagnosis not present

## 2020-01-19 MED ORDER — CYANOCOBALAMIN 1000 MCG/ML IJ SOLN
1000.0000 ug | Freq: Once | INTRAMUSCULAR | Status: AC
  Administered 2020-01-19: 1000 ug via INTRAMUSCULAR

## 2020-01-19 NOTE — Progress Notes (Signed)
Order for B12 placed. Overdue for follow up with PCP. Will need an appointment for any more B12 shots.

## 2020-01-23 ENCOUNTER — Ambulatory Visit: Payer: Medicare Other | Admitting: Occupational Therapy

## 2020-01-23 ENCOUNTER — Other Ambulatory Visit: Payer: Self-pay

## 2020-01-23 ENCOUNTER — Other Ambulatory Visit: Payer: Self-pay | Admitting: Nurse Practitioner

## 2020-01-23 ENCOUNTER — Encounter: Payer: Medicare Other | Admitting: Occupational Therapy

## 2020-01-23 DIAGNOSIS — I890 Lymphedema, not elsewhere classified: Secondary | ICD-10-CM

## 2020-01-23 DIAGNOSIS — E538 Deficiency of other specified B group vitamins: Secondary | ICD-10-CM

## 2020-01-23 NOTE — Therapy (Signed)
[place] MAIN [name] [address] [place], [name], [address] Phone: [phone]   Fax:  [phone]  Occupational Therapy Treatment  Patient Details  Name: Sharon [name] MRN: [id number] Date of Birth: Aug 12, 1946 Referring Provider (OT): Eulogio Ditch, NP   Encounter Date: 01/23/2020  OT End of Session - 01/23/20 1615    Visit Number  15    Number of Visits  36    Date for OT Re-Evaluation  02/12/20    Authorization - Visit Number  3    Authorization - Number of Visits  29    OT Start Time  0210    OT Stop Time  0305    OT Time Calculation (min)  55 min    Activity Tolerance  Patient tolerated treatment well;No increased pain    Behavior During Therapy  WFL for tasks assessed/performed       Past Medical History:  Diagnosis Date  . Anxiety   . Arthritis    hands, upper back  . Asthma   . COPD (chronic obstructive pulmonary disease) ([place])   . History of cervical cancer   . Menopausal disorder   . Osteoporosis   . Pneumonia 1960  . Spasm of abdominal muscles of right side    intermittent  . TMJ (dislocation of temporomandibular joint)   . Wears dentures    partial lower    Past Surgical History:  Procedure Laterality Date  . ABDOMINAL HYSTERECTOMY  1970's  . bladder botox  2005  . BLADDER SUSPENSION  2004  . CATARACT EXTRACTION W/PHACO Right 03/09/2018   Procedure: CATARACT EXTRACTION PHACO AND INTRAOCULAR LENS PLACEMENT ([place]) right;  Surgeon: Eulogio Bear, MD;  Location: [place];  Service: Ophthalmology;  Laterality: Right;  CALL CELL 1ST  . CATARACT EXTRACTION W/PHACO Left 04/19/2018   Procedure: CATARACT EXTRACTION PHACO AND INTRAOCULAR LENS PLACEMENT (IOC)  LEFT;  Surgeon: Eulogio Bear, MD;  Location: [place];  Service: Ophthalmology;  Laterality: Left;  . COLONOSCOPY WITH PROPOFOL N/A 12/13/2015   Procedure: COLONOSCOPY WITH PROPOFOL;  Surgeon: Lucilla Lame, MD;  Location: [place];  Service: Endoscopy;  Laterality: N/A;  . LOOP RECORDER INSERTION N/A 01/07/2018   Procedure: LOOP RECORDER INSERTION;  Surgeon: Deboraha Sprang, MD;  Location: [place] [facility];  Service: Cardiovascular;  Laterality: N/A;  . POLYPECTOMY N/A 12/13/2015   Procedure: POLYPECTOMY;  Surgeon: Lucilla Lame, MD;  Location: [place];  Service: Endoscopy;  Laterality: N/A;  SIGMOID COLON POLYPS X  5  . SHOULDER ARTHROSCOPY W/ ROTATOR CUFF REPAIR Right 1998  . TEE WITHOUT CARDIOVERSION N/A 01/06/2018   Procedure: TRANSESOPHAGEAL ECHOCARDIOGRAM (TEE);  Surgeon: Minna Merritts, MD;  Location: [facility];  Service: Cardiovascular;  Laterality: N/A;  . TONSILLECTOMY AND ADENOIDECTOMY      There were no vitals filed for this visit.  Subjective Assessment - 01/23/20 1614    Subjective   Sharon [name] for OT visit 15 of 36 to address BLE Lymphedema. Pt has no new complaints today. She tells me she continues to have pain and tightness in her ankles.                           OT Education - 01/23/20 1615    Education Details  Continued skilled Pt/caregiver education  And LE ADL training throughout visit for lymphedema self care/ home program, including compression wrapping, compression garment and device  wear/care, lymphatic pumping ther ex, simple self-MLD, and skin care. Discussed progress towards goals.    Person(s) Educated  Patient    Methods  Explanation;Demonstration    Comprehension  Verbalized understanding;Returned demonstration          OT Long Term Goals - 11/22/19 0955      OT LONG TERM GOAL #1   Title  Pt will demonstrate understanding of lymphedema (LE) precautions / prevention principals, including signs / symptoms of cellulitis infection with modified independence using LE Workbook as printed reference to identify 6 precautions without verbal cues by end of 3rd  OT Rx visit.     Baseline  erbal cues    Time  3    Period  Days     Status  Achieved      OT LONG TERM GOAL #2   Title  Pt will be able todon and doff existing compression garments independently and without assistive devices and/ or extra time for optimal lymphedema self management over time and to limit infection risk.    Baseline  supervision    Time  4    Period  Days    Status  Achieved      OT LONG TERM GOAL #3   Title  Pt to achieve at least  10% limb volume reduction in affected limb(s)  bilaterally during Intensive Phase CDT to control limb swelling, to improve tissue integrity and immune function, to improve ADLs performance and to improve functional mobility/ transfer, and to improve body image and self-esteem.    Baseline  max A    Time  8    Period  Weeks    Status  New      OT LONG TERM GOAL #4   Title  Pt will achieve 100% compliance with daily LE self-care home program components, including daily  skin care, simple self-MLD, gradient compression wraps/ compression garments/devices, and therapeutic exercise with Mod CG support to ensure optimal Intensive Phase limb volume reduction to expedite compression garment/ device fitting.    Baseline  50% compliant    Time  12    Period  Weeks    Status  Partially Met      OT LONG TERM GOAL #5   Title  Pt will use appropriate  assistive devices during LE self-care training to don compression garments/devices with modified independence to ensure optimal LE self-management over time and to limit progression of chronic LE.    Baseline  min A    Time  12    Period  Weeks    Status  Deferred   DC goal     OT LONG TERM GOAL #6   Title  Pt will retain optimal limb volume reductions achieved during Intensive Phase CDT with no more than 3% volume increase with ongoing CG assistance to limit LE progression and further functional decline.    Baseline  min A    Time  6    Period  Months    Status  New            Plan - 01/23/20 1616    Clinical Impression Statement  Ongoing sense of tightness,  pain and limited skin flexibility in ankles most likely due to worsening  lipodermatosclerosis 2/2 CVI and exacerbated by high protien lymphedema. Pt  demonstrates some improvement after deep fibrosis techniques in clinic, evidenced by increasd AROM at ankles and decreased sensation of tightness and fullness, but condition reoccurs over the visit interval and existing  cm0pression garments do not provide the compression combined with proper ccontainment to limit this  refiill cycle. Pt advised that she should consider replacing inadequate OTS compression garments , but sheis slow to make the change due to cost concerns and ease of donning/doffing OTS stockings. Pt tolerated menaual therapy without increased pain today. Cont as per POC.       Patient will benefit from skilled therapeutic intervention in order to improve the following deficits and impairments:           Visit Diagnosis: Lymphedema, not elsewhere classified    Problem List Patient Active Problem List   Diagnosis Date Noted  . Vitamin D deficiency 06/28/2019  . Prediabetes 06/28/2019  . GERD (gastroesophageal reflux disease) 12/07/2018  . Vitamin B12 deficiency 12/05/2018  . Senile purpura ([place]) 07/28/2018  . Chronic venous insufficiency 07/14/2018  . Lymphedema 07/14/2018  . Aneurysm of descending thoracic aorta (HCC) 05/04/2018  . Allergic rhinitis 02/16/2018  . Aortic atherosclerosis ([place]) 01/13/2018  . Status post placement of implantable loop recorder 01/12/2018  . Chronic kidney disease, stage 3 01/12/2018  . Cerebrovascular accident (CVA) due to embolism of precerebral artery ([place])   . TIA (transient ischemic attack) 01/04/2018  . Osteoarthritis of wrist 12/09/2017  . CAD (coronary artery disease) 11/25/2017  . Caregiver stress 11/25/2017  . Menopause 08/14/2017  . De Quervain's tenosynovitis, right 07/17/2017  . Stress incontinence 06/19/2017  . Personal history of tobacco use, presenting hazards to health  04/01/2017  . Anxiety 03/13/2017  . Glossitis 11/14/2016  . Advanced care planning/counseling discussion 10/17/2016  . Benign neoplasm of sigmoid colon   . COPD (chronic obstructive pulmonary disease) ([place]) 10/17/2015  . Mass of upper inner quadrant of right breast 06/22/2015    Sharon Spearman, MS, OTR/L, [facility] 01/23/20 4:24 PM  Sharon [name] [address] [place], [name], [address] Phone: [phone]   Fax:  [phone]  Name: Sharon [name] MRN: [id number] Date of Birth: 09-07-1946

## 2020-01-25 ENCOUNTER — Ambulatory Visit (INDEPENDENT_AMBULATORY_CARE_PROVIDER_SITE_OTHER): Payer: Medicare Other | Admitting: Pharmacist

## 2020-01-25 DIAGNOSIS — I6310 Cerebral infarction due to embolism of unspecified precerebral artery: Secondary | ICD-10-CM | POA: Diagnosis not present

## 2020-01-25 DIAGNOSIS — J410 Simple chronic bronchitis: Secondary | ICD-10-CM | POA: Diagnosis not present

## 2020-01-25 DIAGNOSIS — J449 Chronic obstructive pulmonary disease, unspecified: Secondary | ICD-10-CM

## 2020-01-25 DIAGNOSIS — I872 Venous insufficiency (chronic) (peripheral): Secondary | ICD-10-CM

## 2020-01-25 DIAGNOSIS — I2510 Atherosclerotic heart disease of native coronary artery without angina pectoris: Secondary | ICD-10-CM | POA: Diagnosis not present

## 2020-01-25 NOTE — Patient Instructions (Signed)
Visit Information  Goals Addressed            This Visit's Progress     Patient Stated   . PharmD "My leg is still not completely healed" (pt-stated)       CARE PLAN ENTRY (see longtitudinal plan of care for additional care plan information)  Current Barriers:  . Polypharmacy; complex patient with multiple comorbidities including chronic venous insufficiency, COPD, CAD, hx CVA, with recent cellulitis requiring multiple rounds of antibiotics o Leg swelling much improved w/ massage therapy. Notes continued concerns w/ discoloration.  o Reports good visit w/ Cardiology. Talked about speech therapy, plans on discussing w/ PCP at next visit. o Concerned about fat in her midsection. Wonders if this is related to steroid use. . Chronic medical problems: o ASCVD (hx CVA); clopidogrel 75 mg daily; atorvastatin 40 mg daily (last LDL 70);  o COPD/Allergies: Symbicort 160/4.5 mcg 2 puffs BID + Albuterol PRN; montelukast 10 mg daily, loratadine 10 mg daily; wonders if the ICS in Symbicort is contributing to her weight gain.  o Depression: citalopram 20 mg daily PRN o Post-menopausal symptoms: estradiol 0.1 mg patch twice weekly o GERD: pantoprazole 40 mg daily  Pharmacist Clinical Goal(s):  Marland Kitchen Over the next 90 days, patient will work with PharmD and provider towards optimized medication management   Interventions: . Comprehensive medication review performed, medication list updated in electronic medical record . Inter-disciplinary care team collaboration (see longitudinal plan of care) . Discussed that low dose inhaled corticosteroid is likely not contributing to weight concerns. However, appropriate to discuss if LABA/LAMA would be more appropriate maintenance therapy for patient's lung disease. No spirometry on file. If possible at next appointment pending restrictions, consider spirometry. Consider change to once daily Anoro.  . Discussed that patient is due for f/u with PCP. She has labwork  tomorrow, she will take that opportunity to schedule an appt w/ PCP ~June.   Patient Self Care Activities:  . Patient will take medications as prescribed  Please see past updates related to this goal by clicking on the "Past Updates" button in the selected goal         Patient verbalizes understanding of instructions provided today.   Plan:  - Scheduled f/u call 04/18/20  Catie Darnelle Maffucci, PharmD, [place] [phone]

## 2020-01-25 NOTE — Chronic Care Management (AMB) (Signed)
Chronic Care Management   Follow Up Note   01/25/2020 Name: JEANMARIE MCCOWEN MRN: [id number] DOB: [birth year]  Referred by: [name], NP Reason for referral : Chronic Care Management (Medication Management)   LYRA ALAIMO is a 74 y.o. year old female who is a primary care patient of [name], [name], NP. The CCM team was consulted for assistance with chronic disease management and care coordination needs.    Contacted patient for medication management review.   Review of patient status, including review of consultants reports, relevant laboratory and other test results, and collaboration with appropriate care team members and the patient's provider was performed as part of comprehensive patient evaluation and provision of chronic care management services.    SDOH (Social Determinants of Health) assessments performed: No See Care Plan activities for detailed interventions related to [facility])     Outpatient Encounter Medications as of 01/25/2020  Medication Sig Note  . budesonide-formoterol (SYMBICORT) 160-4.5 MCG/ACT inhaler USE 2 PUFFS BY MOUTH TWICE DAILY.   Marland Kitchen Cyanocobalamin 1000 MCG/ML KIT Inject 1,000 mcg as directed every 30 (thirty) days.   . penicillin v potassium (VEETID) 250 MG tablet Take 1 tablet (250 mg total) by mouth 2 (two) times daily.   Marland Kitchen albuterol (PROAIR HFA) 108 (90 Base) MCG/ACT inhaler Inhale 2 puffs into the lungs every 6 (six) hours as needed for wheezing or shortness of breath.   Marland Kitchen atorvastatin (LIPITOR) 40 MG tablet TAKE 1 TABLET BY MOUTH ONCE A DAY AT 6 PM   . benzonatate (TESSALON PERLES) 100 MG capsule Take 1 capsule (100 mg total) by mouth 3 (three) times daily as needed for cough. (Patient not taking: Reported on 11/10/2019)   . citalopram (CELEXA) 20 MG tablet Take 1 tablet (20 mg total) by mouth daily. 05/27/2019: As needed  . clopidogrel (PLAVIX) 75 MG tablet TAKE 1 TABLET(75 MG) BY MOUTH DAILY   . estradiol (VIVELLE-DOT) 0.1 MG/24HR patch APPLY 1  PATCH ONTO THE SKIN 2 TIMES A WEEK   . Loratadine (CLARITIN PO) Take by mouth daily.   . meclizine (ANTIVERT) 12.5 MG tablet Take 1 tablet (12.5 mg total) by mouth 3 (three) times daily as needed for dizziness (only if needed).   . montelukast (SINGULAIR) 10 MG tablet Take 1 tablet (10 mg total) by mouth at bedtime.   . Multiple Vitamin (MULTIVITAMIN) tablet Take 2 tablets by mouth daily.    . pantoprazole (PROTONIX) 40 MG tablet Take 1 tablet (40 mg total) by mouth daily.   Marland Kitchen triamcinolone cream (KENALOG) 0.1 % Apply 1 application topically 2 (two) times daily.    No facility-administered encounter medications on file as of 01/25/2020.     Objective:   Goals Addressed            This Visit's Progress     Patient Stated   . PharmD "My leg is still not completely healed" (pt-stated)       CARE PLAN ENTRY (see longtitudinal plan of care for additional care plan information)  Current Barriers:  . Polypharmacy; complex patient with multiple comorbidities including chronic venous insufficiency, COPD, CAD, hx CVA, with recent cellulitis requiring multiple rounds of antibiotics o Leg swelling much improved w/ massage therapy. Notes continued concerns w/ discoloration.  o Reports good visit w/ Cardiology. Talked about speech therapy, plans on discussing w/ PCP at next visit. o Concerned about fat in her midsection. Wonders if this is related to steroid use. . Chronic medical problems: o ASCVD (hx CVA);  clopidogrel 75 mg daily; atorvastatin 40 mg daily (last LDL 70);  o COPD/Allergies: Symbicort 160/4.5 mcg 2 puffs BID + Albuterol PRN; montelukast 10 mg daily, loratadine 10 mg daily; wonders if the ICS in Symbicort is contributing to her weight gain.  o Depression: citalopram 20 mg daily PRN o Post-menopausal symptoms: estradiol 0.1 mg patch twice weekly o GERD: pantoprazole 40 mg daily  Pharmacist Clinical Goal(s):  Marland Kitchen Over the next 90 days, patient will work with PharmD and provider  towards optimized medication management   Interventions: . Comprehensive medication review performed, medication list updated in electronic medical record . Inter-disciplinary care team collaboration (see longitudinal plan of care) . Discussed that low dose inhaled corticosteroid is likely not contributing to weight concerns. However, appropriate to discuss if LABA/LAMA would be more appropriate maintenance therapy for patient's lung disease. No spirometry on file. If possible at next appointment pending restrictions, consider spirometry. Consider change to once daily Anoro.  . Discussed that patient is due for f/u with PCP. She has labwork tomorrow, she will take that opportunity to schedule an appt w/ PCP ~June.   Patient Self Care Activities:  . Patient will take medications as prescribed  Please see past updates related to this goal by clicking on the "Past Updates" button in the selected goal          Plan:  - Scheduled f/u call 04/18/20  Catie Darnelle Maffucci, PharmD, [place] [phone]

## 2020-01-26 ENCOUNTER — Other Ambulatory Visit: Payer: Medicare Other

## 2020-01-26 ENCOUNTER — Other Ambulatory Visit: Payer: Self-pay

## 2020-01-26 DIAGNOSIS — E538 Deficiency of other specified B group vitamins: Secondary | ICD-10-CM | POA: Diagnosis not present

## 2020-01-27 LAB — CBC WITH DIFFERENTIAL/PLATELET
Basophils Absolute: 0 10*3/uL (ref 0.0–0.2)
Basos: 0 %
EOS (ABSOLUTE): 0.1 10*3/uL (ref 0.0–0.4)
Eos: 1 %
Hematocrit: 43.6 % (ref 34.0–46.6)
Hemoglobin: 15 g/dL (ref 11.1–15.9)
Immature Grans (Abs): 0 10*3/uL (ref 0.0–0.1)
Immature Granulocytes: 0 %
Lymphocytes Absolute: 1.6 10*3/uL (ref 0.7–3.1)
Lymphs: 17 %
MCH: 30.9 pg (ref 26.6–33.0)
MCHC: 34.4 g/dL (ref 31.5–35.7)
MCV: 90 fL (ref 79–97)
Monocytes Absolute: 0.8 10*3/uL (ref 0.1–0.9)
Monocytes: 9 %
Neutrophils Absolute: 6.5 10*3/uL (ref 1.4–7.0)
Neutrophils: 73 %
Platelets: 201 10*3/uL (ref 150–450)
RBC: 4.85 x10E6/uL (ref 3.77–5.28)
RDW: 12.6 % (ref 11.7–15.4)
WBC: 9 10*3/uL (ref 3.4–10.8)

## 2020-01-27 LAB — VITAMIN B12: Vitamin B-12: 1194 pg/mL (ref 232–1245)

## 2020-01-27 NOTE — Progress Notes (Signed)
Contacted via MyChart

## 2020-01-29 LAB — CUP PACEART REMOTE DEVICE CHECK
Date Time Interrogation Session: 20210418014344
Implantable Pulse Generator Implant Date: 20190328

## 2020-01-30 ENCOUNTER — Ambulatory Visit (INDEPENDENT_AMBULATORY_CARE_PROVIDER_SITE_OTHER): Payer: Medicare Other | Admitting: *Deleted

## 2020-01-30 ENCOUNTER — Ambulatory Visit: Payer: Medicare Other | Admitting: Occupational Therapy

## 2020-01-30 ENCOUNTER — Other Ambulatory Visit: Payer: Self-pay

## 2020-01-30 DIAGNOSIS — I890 Lymphedema, not elsewhere classified: Secondary | ICD-10-CM

## 2020-01-30 DIAGNOSIS — I6310 Cerebral infarction due to embolism of unspecified precerebral artery: Secondary | ICD-10-CM

## 2020-01-30 NOTE — Therapy (Signed)
[place] MAIN [facility] SERVICES [address] [place], [name], [address] Phone: [phone]   Fax:  [phone]  Occupational Therapy Treatment  Patient Details  Name: Sharon [name] MRN: [id number] Date of Birth: [birth year] Referring Provider (OT): Eulogio Ditch, NP   Encounter Date: 01/30/2020  OT End of Session - 01/30/20 1411    Visit Number  16    OT Start Time  0206    OT Stop Time  0300    OT Time Calculation (min)  54 min       Past Medical History:  Diagnosis Date  . Anxiety   . Arthritis    hands, upper back  . Asthma   . COPD (chronic obstructive pulmonary disease) ([place])   . History of cervical cancer   . Menopausal disorder   . Osteoporosis   . Pneumonia 1960  . Spasm of abdominal muscles of right side    intermittent  . TMJ (dislocation of temporomandibular joint)   . Wears dentures    partial lower    Past Surgical History:  Procedure Laterality Date  . ABDOMINAL HYSTERECTOMY  1970's  . bladder botox  2005  . BLADDER SUSPENSION  2004  . CATARACT EXTRACTION W/PHACO Right 03/09/2018   Procedure: CATARACT EXTRACTION PHACO AND INTRAOCULAR LENS PLACEMENT ([place]) right;  Surgeon: Eulogio Bear, MD;  Location: [place];  Service: Ophthalmology;  Laterality: Right;  CALL CELL 1ST  . CATARACT EXTRACTION W/PHACO Left 04/19/2018   Procedure: CATARACT EXTRACTION PHACO AND INTRAOCULAR LENS PLACEMENT (I[facility])  LEFT;  Surgeon: Eulogio Bear, MD;  Location: [place];  Service: Ophthalmology;  Laterality: Left;  . COLONOSCOPY WITH PROPOFOL N/A 12/13/2015   Procedure: COLONOSCOPY WITH PROPOFOL;  Surgeon: Lucilla Lame, MD;  Location: [place];  Service: Endoscopy;  Laterality: N/A;  . LOOP RECORDER INSERTION N/A 01/07/2018   Procedure: LOOP RECORDER INSERTION;  Surgeon: Deboraha Sprang, MD;  Location: [place] [facility];  Service: Cardiovascular;  Laterality: N/A;  . POLYPECTOMY N/A 12/13/2015   Procedure: POLYPECTOMY;  Surgeon: Lucilla Lame, MD;  Location: [place];  Service: Endoscopy;  Laterality: N/A;  SIGMOID COLON POLYPS X  5  . SHOULDER ARTHROSCOPY W/ ROTATOR CUFF REPAIR Right 1998  . TEE WITHOUT CARDIOVERSION N/A 01/06/2018   Procedure: TRANSESOPHAGEAL ECHOCARDIOGRAM (TEE);  Surgeon: Minna Merritts, MD;  Location: [facility];  Service: Cardiovascular;  Laterality: N/A;  . TONSILLECTOMY AND ADENOIDECTOMY      There were no vitals filed for this visit.  Subjective Assessment - 01/30/20 1411    Subjective   Mrs Tearra Ouk for OT visit 15 of 36 to address BLE Lymphedema. Pt complains of increast OA pain. Pt and OT discussed possibloe side effects of statin drugs include  aches and pains. Pt will discuss with her PCP.                   OT Treatments/Exercises (OP) - 01/30/20 0001      ADLs   ADL Education Given  Yes      Manual Therapy   Manual Therapy  Edema management;Manual Lymphatic Drainage (MLD);Soft tissue mobilization    Manual therapy comments  skin care w/ low ph castor oil throughout MLD to increase skin hydration and tissue mobuility/flexibility.    Soft tissue mobilization  fibrosis   techniques to bilateral ankles    Manual Lymphatic Drainage (MLD)  MLD ro BLE using short neck, abdominal pathways and functional inguinal LN,.  Fibrosis techniques to ankles and feet bilaterally    Compression Bandaging  Pt dons         compression socks independently after session.             OT Education - 01/30/20 1411    Education Details  Continued skilled Pt/caregiver education  And LE ADL training throughout visit for lymphedema self care/ home program, including compression wrapping, compression garment and device wear/care, lymphatic pumping ther ex, simple self-MLD, and skin care. Discussed progress towards goals.    Person(s) Educated  Patient    Methods  Explanation;Demonstration    Comprehension  Verbalized understanding;Returned  demonstration          OT Long Term Goals - 11/22/19 0955      OT LONG TERM GOAL #1   Title  Pt will demonstrate understanding of lymphedema (LE) precautions / prevention principals, including signs / symptoms of cellulitis infection with modified independence using LE Workbook as printed reference to identify 6 precautions without verbal cues by end of 3rd  OT Rx visit.     Baseline  erbal cues    Time  3    Period  Days    Status  Achieved      OT LONG TERM GOAL #2   Title  Pt will be able todon and doff existing compression garments independently and without assistive devices and/ or extra time for optimal lymphedema self management over time and to limit infection risk.    Baseline  supervision    Time  4    Period  Days    Status  Achieved      OT LONG TERM GOAL #3   Title  Pt to achieve at least  10% limb volume reduction in affected limb(s)  bilaterally during Intensive Phase CDT to control limb swelling, to improve tissue integrity and immune function, to improve ADLs performance and to improve functional mobility/ transfer, and to improve body image and self-esteem.    Baseline  max A    Time  8    Period  Weeks    Status  New      OT LONG TERM GOAL #4   Title  Pt will achieve 100% compliance with daily LE self-care home program components, including daily  skin care, simple self-MLD, gradient compression wraps/ compression garments/devices, and therapeutic exercise with Mod CG support to ensure optimal Intensive Phase limb volume reduction to expedite compression garment/ device fitting.    Baseline  50% compliant    Time  12    Period  Weeks    Status  Partially Met      OT LONG TERM GOAL #5   Title  Pt will use appropriate  assistive devices during LE self-care training to don compression garments/devices with modified independence to ensure optimal LE self-management over time and to limit progression of chronic LE.    Baseline  min A    Time  12    Period  Weeks     Status  Deferred   DC goal     OT LONG TERM GOAL #6   Title  Pt will retain optimal limb volume reductions achieved during Intensive Phase CDT with no more than 3% volume increase with ongoing CG assistance to limit LE progression and further functional decline.    Baseline  min A    Time  6    Period  Months    Status  New  Plan - 01/30/20 1510    Clinical Impression Statement  Pt tolerated MLD with deep bliateral fibrosis techniques. Pt c/o pain when standing after manual therapy . Cont as per POC.       Patient will benefit from skilled therapeutic intervention in order to improve the following deficits and impairments:           Visit Diagnosis: Lymphedema, not elsewhere classified    Problem List Patient Active Problem List   Diagnosis Date Noted  . Vitamin D deficiency 06/28/2019  . Prediabetes 06/28/2019  . GERD (gastroesophageal reflux disease) 12/07/2018  . Vitamin B12 deficiency 12/05/2018  . Senile purpura ([place]) 07/28/2018  . Chronic venous insufficiency 07/14/2018  . Lymphedema 07/14/2018  . Aneurysm of descending thoracic aorta (HCC) 05/04/2018  . Allergic rhinitis 02/16/2018  . Aortic atherosclerosis ([place]) 01/13/2018  . Status post placement of implantable loop recorder 01/12/2018  . Chronic kidney disease, stage 3 01/12/2018  . Cerebrovascular accident (CVA) due to embolism of precerebral artery ([place])   . TIA (transient ischemic attack) 01/04/2018  . Osteoarthritis of wrist 12/09/2017  . CAD (coronary artery disease) 11/25/2017  . Caregiver stress 11/25/2017  . Menopause 08/14/2017  . De Quervain's tenosynovitis, right 07/17/2017  . Stress incontinence 06/19/2017  . Personal history of tobacco use, presenting hazards to health 04/01/2017  . Anxiety 03/13/2017  . Glossitis 11/14/2016  . Advanced care planning/counseling discussion 10/17/2016  . Benign neoplasm of sigmoid colon   . COPD (chronic obstructive pulmonary disease)  ([place]) 10/17/2015  . Mass of upper inner quadrant of right breast 06/22/2015   Andrey Spearman, MS, OTR/L, [facility] 01/30/20 3:12 PM  [place] MAIN [facility] SERVICES [address] [place], [name], [address] Phone: [phone]   Fax:  [phone]  Name: ADILYNN BESSEY MRN: [id number] Date of Birth: [birth year]

## 2020-01-31 ENCOUNTER — Other Ambulatory Visit: Payer: Self-pay

## 2020-01-31 ENCOUNTER — Ambulatory Visit: Payer: Medicare Other | Admitting: Occupational Therapy

## 2020-01-31 ENCOUNTER — Ambulatory Visit: Payer: Self-pay | Admitting: Pharmacist

## 2020-01-31 DIAGNOSIS — J449 Chronic obstructive pulmonary disease, unspecified: Secondary | ICD-10-CM

## 2020-01-31 DIAGNOSIS — I6310 Cerebral infarction due to embolism of unspecified precerebral artery: Secondary | ICD-10-CM

## 2020-01-31 DIAGNOSIS — I700 Atherosclerosis of aorta: Secondary | ICD-10-CM

## 2020-01-31 DIAGNOSIS — I890 Lymphedema, not elsewhere classified: Secondary | ICD-10-CM

## 2020-01-31 DIAGNOSIS — J410 Simple chronic bronchitis: Secondary | ICD-10-CM

## 2020-01-31 DIAGNOSIS — I2510 Atherosclerotic heart disease of native coronary artery without angina pectoris: Secondary | ICD-10-CM

## 2020-01-31 NOTE — Therapy (Deleted)
[place] MAIN [facility] SERVICES [address] [place], [name], [address] Phone: [phone]   Fax:  [phone]  Occupational Therapy Evaluation  Patient Details  Name: Sharon [name] MRN: [id number] Date of Birth: September 28, 1946 Referring Provider (OT): Eulogio Ditch, NP   Encounter Date: 01/31/2020  OT End of Session - 01/31/20 1259    Visit Number  17    Number of Visits  36    Date for OT Re-Evaluation  02/12/20    OT Start Time  0100    OT Stop Time  0210    OT Time Calculation (min)  70 min    Activity Tolerance  Patient tolerated treatment well;No increased pain    Behavior During Therapy  WFL for tasks assessed/performed       Past Medical History:  Diagnosis Date  . Anxiety   . Arthritis    hands, upper back  . Asthma   . COPD (chronic obstructive pulmonary disease) ([place])   . History of cervical cancer   . Menopausal disorder   . Osteoporosis   . Pneumonia 1960  . Spasm of abdominal muscles of right side    intermittent  . TMJ (dislocation of temporomandibular joint)   . Wears dentures    partial lower    Past Surgical History:  Procedure Laterality Date  . ABDOMINAL HYSTERECTOMY  1970's  . bladder botox  2005  . BLADDER SUSPENSION  2004  . CATARACT EXTRACTION W/PHACO Right 03/09/2018   Procedure: CATARACT EXTRACTION PHACO AND INTRAOCULAR LENS PLACEMENT ([place]) right;  Surgeon: Eulogio Bear, MD;  Location: [place];  Service: Ophthalmology;  Laterality: Right;  CALL CELL 1ST  . CATARACT EXTRACTION W/PHACO Left 04/19/2018   Procedure: CATARACT EXTRACTION PHACO AND INTRAOCULAR LENS PLACEMENT ([facility])  LEFT;  Surgeon: Eulogio Bear, MD;  Location: [place];  Service: Ophthalmology;  Laterality: Left;  . COLONOSCOPY WITH PROPOFOL N/A 12/13/2015   Procedure: COLONOSCOPY WITH PROPOFOL;  Surgeon: Lucilla Lame, MD;  Location: [place];  Service: Endoscopy;  Laterality: N/A;  . LOOP RECORDER  INSERTION N/A 01/07/2018   Procedure: LOOP RECORDER INSERTION;  Surgeon: Deboraha Sprang, MD;  Location: [place] [facility];  Service: Cardiovascular;  Laterality: N/A;  . POLYPECTOMY N/A 12/13/2015   Procedure: POLYPECTOMY;  Surgeon: Lucilla Lame, MD;  Location: [place];  Service: Endoscopy;  Laterality: N/A;  SIGMOID COLON POLYPS X  5  . SHOULDER ARTHROSCOPY W/ ROTATOR CUFF REPAIR Right 1998  . TEE WITHOUT CARDIOVERSION N/A 01/06/2018   Procedure: TRANSESOPHAGEAL ECHOCARDIOGRAM (TEE);  Surgeon: Minna Merritts, MD;  Location: [facility];  Service: Cardiovascular;  Laterality: N/A;  . TONSILLECTOMY AND ADENOIDECTOMY      There were no vitals filed for this visit.  Subjective Assessment - 01/31/20 1258    Subjective   Sharon [name] for OT visit 16 of 36 to address BLE Lymphedema. Pt complains of increast OA pain. Pt and OT discussed possibloe side effects of statin drugs include  aches and pains. Pt will discuss with her PCP.                  OT Treatments/Exercises (OP) - 01/31/20 0001      ADLs   ADL Education Given  Yes      Manual Therapy   Manual Therapy  Edema management;Manual Lymphatic Drainage (MLD);Soft tissue mobilization    Manual therapy comments  skin care w/ low ph castor oil throughout MLD to  increase skin hydration and tissue mobuility/flexibility.    Soft tissue mobilization  fibrosis   techniques to bilateral ankles    Manual Lymphatic Drainage (MLD)  MLD ro BLE using short neck, abdominal pathways and functional inguinal LN,. Fibrosis techniques to ankles and feet bilaterally    Compression Bandaging  Pt dons         compression socks independently after session.            OT Education - 01/31/20 1259    Education Details  Continued skilled Pt/caregiver education  And LE ADL training throughout visit for lymphedema self care/ home program, including compression wrapping, compression garment and device wear/care, lymphatic pumping  ther ex, simple self-MLD, and skin care. Discussed progress towards goals.    Person(s) Educated  Patient    Methods  Explanation;Demonstration    Comprehension  Verbalized understanding;Returned demonstration          OT Long Term Goals - 11/22/19 0955      OT LONG TERM GOAL #1   Title  Pt will demonstrate understanding of lymphedema (LE) precautions / prevention principals, including signs / symptoms of cellulitis infection with modified independence using LE Workbook as printed reference to identify 6 precautions without verbal cues by end of 3rd  OT Rx visit.     Baseline  erbal cues    Time  3    Period  Days    Status  Achieved      OT LONG TERM GOAL #2   Title  Pt will be able todon and doff existing compression garments independently and without assistive devices and/ or extra time for optimal lymphedema self management over time and to limit infection risk.    Baseline  supervision    Time  4    Period  Days    Status  Achieved      OT LONG TERM GOAL #3   Title  Pt to achieve at least  10% limb volume reduction in affected limb(s)  bilaterally during Intensive Phase CDT to control limb swelling, to improve tissue integrity and immune function, to improve ADLs performance and to improve functional mobility/ transfer, and to improve body image and self-esteem.    Baseline  max A    Time  8    Period  Weeks    Status  New      OT LONG TERM GOAL #4   Title  Pt will achieve 100% compliance with daily LE self-care home program components, including daily  skin care, simple self-MLD, gradient compression wraps/ compression garments/devices, and therapeutic exercise with Mod CG support to ensure optimal Intensive Phase limb volume reduction to expedite compression garment/ device fitting.    Baseline  50% compliant    Time  12    Period  Weeks    Status  Partially Met      OT LONG TERM GOAL #5   Title  Pt will use appropriate  assistive devices during LE self-care training  to don compression garments/devices with modified independence to ensure optimal LE self-management over time and to limit progression of chronic LE.    Baseline  min A    Time  12    Period  Weeks    Status  Deferred   DC goal     OT LONG TERM GOAL #6   Title  Pt will retain optimal limb volume reductions achieved during Intensive Phase CDT with no more than 3% volume increase with ongoing CG assistance  to limit LE progression and further functional decline.    Baseline  min A    Time  6    Period  Months    Status  New            Plan - 01/31/20 1624    Clinical Impression Statement  Pt tolerated MLD with deep bliateral fibrosis techniques. Pt reported decreased pain ad tigtness pain when standing after manual therapy . Cont as per POC.       Patient will benefit from skilled therapeutic intervention in order to improve the following deficits and impairments:           Visit Diagnosis: Lymphedema, not elsewhere classified    Problem List Patient Active Problem List   Diagnosis Date Noted  . Vitamin D deficiency 06/28/2019  . Prediabetes 06/28/2019  . GERD (gastroesophageal reflux disease) 12/07/2018  . Vitamin B12 deficiency 12/05/2018  . Senile purpura ([place]) 07/28/2018  . Chronic venous insufficiency 07/14/2018  . Lymphedema 07/14/2018  . Aneurysm of descending thoracic aorta (HCC) 05/04/2018  . Allergic rhinitis 02/16/2018  . Aortic atherosclerosis ([place]) 01/13/2018  . Status post placement of implantable loop recorder 01/12/2018  . Chronic kidney disease, stage 3 01/12/2018  . Cerebrovascular accident (CVA) due to embolism of precerebral artery ([place])   . TIA (transient ischemic attack) 01/04/2018  . Osteoarthritis of wrist 12/09/2017  . CAD (coronary artery disease) 11/25/2017  . Caregiver stress 11/25/2017  . Menopause 08/14/2017  . De Quervain's tenosynovitis, right 07/17/2017  . Stress incontinence 06/19/2017  . Personal history of tobacco use,  presenting hazards to health 04/01/2017  . Anxiety 03/13/2017  . Glossitis 11/14/2016  . Advanced care planning/counseling discussion 10/17/2016  . Benign neoplasm of sigmoid colon   . COPD (chronic obstructive pulmonary disease) ([place]) 10/17/2015  . Mass of upper inner quadrant of right breast 06/22/2015    Ansel Bong 01/31/2020, 4:25 PM  [place] MAIN [facility] SERVICES [address] [place], [name], [address] Phone: [phone]   Fax:  [phone]  Name: ILIANI VEJAR MRN: [id number] Date of Birth: [birth year]

## 2020-01-31 NOTE — Progress Notes (Signed)
ILR Remote 

## 2020-01-31 NOTE — Chronic Care Management (AMB) (Signed)
Chronic Care Management   Follow Up Note   01/31/2020 Name: Sharon [name] MRN: [id number] DOB: [birth year]  Referred by: [name], NP Reason for referral : Chronic Care Management (Medication Management)   VIELKA KLINEDINST is a 74 y.o. year old female who is a primary care patient of [name], [name], NP. The CCM team was consulted for assistance with chronic disease management and care coordination needs.    Received call from patient today with medication management concerns.   Review of patient status, including review of consultants reports, relevant laboratory and other test results, and collaboration with appropriate care team members and the patient's provider was performed as part of comprehensive patient evaluation and provision of chronic care management services.    SDOH (Social Determinants of Health) assessments performed: Yes See Care Plan activities for detailed interventions related to [facility])     Outpatient Encounter Medications as of 01/31/2020  Medication Sig Note  . atorvastatin (LIPITOR) 40 MG tablet TAKE 1 TABLET BY MOUTH ONCE A DAY AT 6 PM   . albuterol (PROAIR HFA) 108 (90 Base) MCG/ACT inhaler Inhale 2 puffs into the lungs every 6 (six) hours as needed for wheezing or shortness of breath.   . benzonatate (TESSALON PERLES) 100 MG capsule Take 1 capsule (100 mg total) by mouth 3 (three) times daily as needed for cough. (Patient not taking: Reported on 11/10/2019)   . budesonide-formoterol (SYMBICORT) 160-4.5 MCG/ACT inhaler USE 2 PUFFS BY MOUTH TWICE DAILY.   . citalopram (CELEXA) 20 MG tablet Take 1 tablet (20 mg total) by mouth daily. 05/27/2019: As needed  . clopidogrel (PLAVIX) 75 MG tablet TAKE 1 TABLET(75 MG) BY MOUTH DAILY   . Cyanocobalamin 1000 MCG/ML KIT Inject 1,000 mcg as directed every 30 (thirty) days.   Marland Kitchen estradiol (VIVELLE-DOT) 0.1 MG/24HR patch APPLY 1 PATCH ONTO THE SKIN 2 TIMES A WEEK   . Loratadine (CLARITIN PO) Take by mouth daily.   .  meclizine (ANTIVERT) 12.5 MG tablet Take 1 tablet (12.5 mg total) by mouth 3 (three) times daily as needed for dizziness (only if needed).   . montelukast (SINGULAIR) 10 MG tablet Take 1 tablet (10 mg total) by mouth at bedtime.   . Multiple Vitamin (MULTIVITAMIN) tablet Take 2 tablets by mouth daily.    . pantoprazole (PROTONIX) 40 MG tablet Take 1 tablet (40 mg total) by mouth daily.   . penicillin v potassium (VEETID) 250 MG tablet Take 1 tablet (250 mg total) by mouth 2 (two) times daily.   Marland Kitchen triamcinolone cream (KENALOG) 0.1 % Apply 1 application topically 2 (two) times daily.    No facility-administered encounter medications on file as of 01/31/2020.     Objective:   Goals Addressed            This Visit's Progress     Patient Stated   . PharmD "My leg is still not completely healed" (pt-stated)       CARE PLAN ENTRY (see longtitudinal plan of care for additional care plan information)  Current Barriers:  . Polypharmacy; complex patient with multiple comorbidities including chronic venous insufficiency, COPD, CAD, hx CVA, with recent cellulitis requiring multiple rounds of antibiotics o Notes that she has been "feeling horrible", difficult to discern for how long, but has occasions of getting dizzy, feels like BP drops, drinks water. Biggest concern lately has been hot flashes and joints/muscle ache, in her arms and legs, but also in her hands that makes it more difficult to  crochet and garden. Reports that she was mentioning this to lymphedema therapist yesterday, and she recommended that she discuss atorvastatin with her providers and if it could be related.  . Chronic medical problems: o ASCVD (hx CVA); clopidogrel 75 mg daily; atorvastatin 40 mg daily (last LDL 70);  o COPD/Allergies: Symbicort 160/4.5 mcg 2 puffs BID + Albuterol PRN; montelukast 10 mg daily, loratadine 10 mg daily; wonders if the ICS in Symbicort is contributing to her weight gain.  o Depression: citalopram 20  mg daily PRN o Post-menopausal symptoms: estradiol 0.1 mg patch twice weekly o GERD: pantoprazole 40 mg daily  Pharmacist Clinical Goal(s):  Marland Kitchen Over the next 90 days, patient will work with PharmD and provider towards optimized medication management   Interventions: . Inter-disciplinary care team collaboration (see longitudinal plan of care) . Extensive discussion of statin related muscle symptoms. Discussed that sweating and "BP drops" are very unlikely to be related to statin therapy given mechanism of action, and that pain/discomfort in her hands is more likely related to arthritis. Discussed that typical statin associated muscle symptoms have an onset within a few weeks of starting medication, are bilateral in larger muscle groups, and resolve upon discontinuation. Discussed importance of maintaining statin therapy given hx CVA. Discussed that d/t structure, rosuvastatin may have lower incidence of muscle symptoms. Recommended she schedule appointment w/ PCP to discuss these concerns and formulate plan. Could consider d/c atorvastatin w/ 1-2 week washout, then restart rosuvastatin 10 mg daily.  . Patient asked if there are blood tests to test for arthritis. Discussed rheumatoid vs OA. Encouraged to schedule appointment w/ PCP to discuss concerns and appropriate treatment.  Patient Self Care Activities:  . Patient will take medications as prescribed  Please see past updates related to this goal by clicking on the "Past Updates" button in the selected goal          Plan:  - Will outreach as previously scheduled  Catie Darnelle Maffucci, PharmD, [place] [phone]

## 2020-01-31 NOTE — Therapy (Signed)
[place] MAIN [facility] SERVICES [address] [place], [name], [address] Phone: [phone]   Fax:  [phone]  Occupational Therapy Treatment  Patient Details  Name: Sharon [name] MRN: [id number] Date of Birth: 06-26-1946 Referring Provider (OT): Sharon Ditch, NP   Encounter Date: 01/31/2020  OT End of Session - 01/31/20 1259    Visit Number  17    Number of Visits  36    Date for OT Re-Evaluation  02/12/20    OT Start Time  0100    OT Stop Time  0210    OT Time Calculation (min)  70 min    Activity Tolerance  Patient tolerated treatment well;No increased pain    Behavior During Therapy  WFL for tasks assessed/performed       Past Medical History:  Diagnosis Date  . Anxiety   . Arthritis    hands, upper back  . Asthma   . COPD (chronic obstructive pulmonary disease) ([place])   . History of cervical cancer   . Menopausal disorder   . Osteoporosis   . Pneumonia 1960  . Spasm of abdominal muscles of right side    intermittent  . TMJ (dislocation of temporomandibular joint)   . Wears dentures    partial lower    Past Surgical History:  Procedure Laterality Date  . ABDOMINAL HYSTERECTOMY  1970's  . bladder botox  2005  . BLADDER SUSPENSION  2004  . CATARACT EXTRACTION W/PHACO Right 03/09/2018   Procedure: CATARACT EXTRACTION PHACO AND INTRAOCULAR LENS PLACEMENT ([place]) right;  Surgeon: Sharon Bear, MD;  Location: [place];  Service: Ophthalmology;  Laterality: Right;  CALL CELL 1ST  . CATARACT EXTRACTION W/PHACO Left 04/19/2018   Procedure: CATARACT EXTRACTION PHACO AND INTRAOCULAR LENS PLACEMENT (I[facility])  LEFT;  Surgeon: Sharon Bear, MD;  Location: [place];  Service: Ophthalmology;  Laterality: Left;  . COLONOSCOPY WITH PROPOFOL N/A 12/13/2015   Procedure: COLONOSCOPY WITH PROPOFOL;  Surgeon: Sharon Lame, MD;  Location: [place];  Service: Endoscopy;  Laterality: N/A;  . LOOP RECORDER  INSERTION N/A 01/07/2018   Procedure: LOOP RECORDER INSERTION;  Surgeon: Sharon Sprang, MD;  Location: [place] [facility];  Service: Cardiovascular;  Laterality: N/A;  . POLYPECTOMY N/A 12/13/2015   Procedure: POLYPECTOMY;  Surgeon: Sharon Lame, MD;  Location: [place];  Service: Endoscopy;  Laterality: N/A;  SIGMOID COLON POLYPS X  5  . SHOULDER ARTHROSCOPY W/ ROTATOR CUFF REPAIR Right 1998  . TEE WITHOUT CARDIOVERSION N/A 01/06/2018   Procedure: TRANSESOPHAGEAL ECHOCARDIOGRAM (TEE);  Surgeon: Sharon Merritts, MD;  Location: [facility];  Service: Cardiovascular;  Laterality: N/A;  . TONSILLECTOMY AND ADENOIDECTOMY      There were no vitals filed for this visit.  Subjective Assessment - 01/31/20 1258    Subjective   [name] for OT visit 16 of 36 to address BLE Lymphedema. Pt complains of increast OA pain. Pt and OT discussed possibloe side effects of statin drugs include  aches and pains. Pt will discuss with her PCP.                   OT Treatments/Exercises (OP) - 01/31/20 0001      ADLs   ADL Education Given  Yes      Manual Therapy   Manual Therapy  Edema management;Manual Lymphatic Drainage (MLD);Soft tissue mobilization    Manual therapy comments  skin care w/ low ph castor oil throughout MLD  to increase skin hydration and tissue mobuility/flexibility.    Soft tissue mobilization  fibrosis   techniques to bilateral ankles    Manual Lymphatic Drainage (MLD)  MLD ro BLE using short neck, abdominal pathways and functional inguinal LN,. Fibrosis techniques to ankles and feet bilaterally    Compression Bandaging  Pt dons         compression socks independently after session.             OT Education - 01/31/20 1259    Education Details  Continued skilled Pt/caregiver education  And LE ADL training throughout visit for lymphedema self care/ home program, including compression wrapping, compression garment and device wear/care, lymphatic pumping  ther ex, simple self-MLD, and skin care. Discussed progress towards goals.    Person(s) Educated  Patient    Methods  Explanation;Demonstration    Comprehension  Verbalized understanding;Returned demonstration          OT Long Term Goals - 11/22/19 0955      OT LONG TERM GOAL #1   Title  Pt will demonstrate understanding of lymphedema (LE) precautions / prevention principals, including signs / symptoms of cellulitis infection with modified independence using LE Workbook as printed reference to identify 6 precautions without verbal cues by end of 3rd  OT Rx visit.     Baseline  erbal cues    Time  3    Period  Days    Status  Achieved      OT LONG TERM GOAL #2   Title  Pt will be able todon and doff existing compression garments independently and without assistive devices and/ or extra time for optimal lymphedema self management over time and to limit infection risk.    Baseline  supervision    Time  4    Period  Days    Status  Achieved      OT LONG TERM GOAL #3   Title  Pt to achieve at least  10% limb volume reduction in affected limb(s)  bilaterally during Intensive Phase CDT to control limb swelling, to improve tissue integrity and immune function, to improve ADLs performance and to improve functional mobility/ transfer, and to improve body image and self-esteem.    Baseline  max A    Time  8    Period  Weeks    Status  New      OT LONG TERM GOAL #4   Title  Pt will achieve 100% compliance with daily LE self-care home program components, including daily  skin care, simple self-MLD, gradient compression wraps/ compression garments/devices, and therapeutic exercise with Mod CG support to ensure optimal Intensive Phase limb volume reduction to expedite compression garment/ device fitting.    Baseline  50% compliant    Time  12    Period  Weeks    Status  Partially Met      OT LONG TERM GOAL #5   Title  Pt will use appropriate  assistive devices during LE self-care training  to don compression garments/devices with modified independence to ensure optimal LE self-management over time and to limit progression of chronic LE.    Baseline  min A    Time  12    Period  Weeks    Status  Deferred   DC goal     OT LONG TERM GOAL #6   Title  Pt will retain optimal limb volume reductions achieved during Intensive Phase CDT with no more than 3% volume increase with ongoing  CG assistance to limit LE progression and further functional decline.    Baseline  min A    Time  6    Period  Months    Status  New            Plan - 01/31/20 1624    Clinical Impression Statement  Pt tolerated MLD with deep bliateral fibrosis techniques. Pt reported decreased pain ad tigtness pain when standing after manual therapy . Cont as per POC.       Patient will benefit from skilled therapeutic intervention in order to improve the following deficits and impairments:           Visit Diagnosis: Lymphedema, not elsewhere classified    Problem List Patient Active Problem List   Diagnosis Date Noted  . Vitamin D deficiency 06/28/2019  . Prediabetes 06/28/2019  . GERD (gastroesophageal reflux disease) 12/07/2018  . Vitamin B12 deficiency 12/05/2018  . Senile purpura ([place]) 07/28/2018  . Chronic venous insufficiency 07/14/2018  . Lymphedema 07/14/2018  . Aneurysm of descending thoracic aorta (HCC) 05/04/2018  . Allergic rhinitis 02/16/2018  . Aortic atherosclerosis ([place]) 01/13/2018  . Status post placement of implantable loop recorder 01/12/2018  . Chronic kidney disease, stage 3 01/12/2018  . Cerebrovascular accident (CVA) due to embolism of precerebral artery ([place])   . TIA (transient ischemic attack) 01/04/2018  . Osteoarthritis of wrist 12/09/2017  . CAD (coronary artery disease) 11/25/2017  . Caregiver stress 11/25/2017  . Menopause 08/14/2017  . De Quervain's tenosynovitis, right 07/17/2017  . Stress incontinence 06/19/2017  . Personal history of tobacco use,  presenting hazards to health 04/01/2017  . Anxiety 03/13/2017  . Glossitis 11/14/2016  . Advanced care planning/counseling discussion 10/17/2016  . Benign neoplasm of sigmoid colon   . COPD (chronic obstructive pulmonary disease) ([place]) 10/17/2015  . Mass of upper inner quadrant of right breast 06/22/2015    Andrey Spearman, MS, OTR/L, [facility] 01/31/20 4:26 PM  [place] MAIN [facility] SERVICES [address] [place], [name], [address] Phone: [phone]   Fax:  [phone]  Name: Sharon [name] MRN: [id number] Date of Birth: 26-Sep-1946

## 2020-01-31 NOTE — Patient Instructions (Signed)
Visit Information  Goals Addressed            This Visit's Progress     Patient Stated   . PharmD "My leg is still not completely healed" (pt-stated)       CARE PLAN ENTRY (see longtitudinal plan of care for additional care plan information)  Current Barriers:  . Polypharmacy; complex patient with multiple comorbidities including chronic venous insufficiency, COPD, CAD, hx CVA, with recent cellulitis requiring multiple rounds of antibiotics o Notes that she has been "feeling horrible", difficult to discern for how long, but has occasions of getting dizzy, feels like BP drops, drinks water. Biggest concern lately has been hot flashes and joints/muscle ache, in her arms and legs, but also in her hands that makes it more difficult to crochet and garden. Reports that she was mentioning this to lymphedema therapist yesterday, and she recommended that she discuss atorvastatin with her providers and if it could be related.  . Chronic medical problems: o ASCVD (hx CVA); clopidogrel 75 mg daily; atorvastatin 40 mg daily (last LDL 70);  o COPD/Allergies: Symbicort 160/4.5 mcg 2 puffs BID + Albuterol PRN; montelukast 10 mg daily, loratadine 10 mg daily; wonders if the ICS in Symbicort is contributing to her weight gain.  o Depression: citalopram 20 mg daily PRN o Post-menopausal symptoms: estradiol 0.1 mg patch twice weekly o GERD: pantoprazole 40 mg daily  Pharmacist Clinical Goal(s):  Marland Kitchen Over the next 90 days, patient will work with PharmD and provider towards optimized medication management   Interventions: . Inter-disciplinary care team collaboration (see longitudinal plan of care) . Extensive discussion of statin related muscle symptoms. Discussed that sweating and "BP drops" are very unlikely to be related to statin therapy given mechanism of action, and that pain/discomfort in her hands is more likely related to arthritis. Discussed that typical statin associated muscle symptoms have an onset  within a few weeks of starting medication, are bilateral in larger muscle groups, and resolve upon discontinuation. Discussed importance of maintaining statin therapy given hx CVA. Discussed that d/t structure, rosuvastatin may have lower incidence of muscle symptoms. Recommended she schedule appointment w/ PCP to discuss these concerns and formulate plan. Could consider d/c atorvastatin w/ 1-2 week washout, then restart rosuvastatin 10 mg daily.  . Patient asked if there are blood tests to test for arthritis. Discussed rheumatoid vs OA. Encouraged to schedule appointment w/ PCP to discuss concerns and appropriate treatment.  Patient Self Care Activities:  . Patient will take medications as prescribed  Please see past updates related to this goal by clicking on the "Past Updates" button in the selected goal         Patient verbalizes understanding of instructions provided today.   Plan:  - Will outreach as previously scheduled  Catie Darnelle Maffucci, PharmD, [place] [phone]

## 2020-02-01 ENCOUNTER — Ambulatory Visit: Payer: Self-pay

## 2020-02-01 NOTE — Chronic Care Management (AMB) (Signed)
  Care Management   Follow Up Note   02/01/2020 Name: Sharon [name] MRN: [id number] DOB: 11-23-1945  Referred by: [name], NP Reason for referral : [place] is a 74 y.o. year old female who is a primary care patient of [name], [name], NP. The care management team was consulted for assistance with care management and care coordination needs.    Review of patient status, including review of consultants reports, relevant laboratory and other test results, and collaboration with appropriate care team members and the patient's provider was performed as part of comprehensive patient evaluation and provision of chronic care management services.    LCSW completed CCM outreach attempt today but was unable to reach patient successfully. A HIPPA compliant voice message was left encouraging patient to return call once available. LCSW rescheduled CCM SW appointment as well.  A HIPPA compliant phone message was left for the patient providing contact information and requesting a return call.   [name], BSW, MSW, [facility] Care Management [email or web address] Phone: [phone]

## 2020-02-06 ENCOUNTER — Other Ambulatory Visit: Payer: Self-pay

## 2020-02-06 ENCOUNTER — Ambulatory Visit: Payer: Medicare Other | Admitting: Occupational Therapy

## 2020-02-06 DIAGNOSIS — I890 Lymphedema, not elsewhere classified: Secondary | ICD-10-CM | POA: Diagnosis not present

## 2020-02-06 NOTE — Therapy (Signed)
[place] MAIN [facility] SERVICES [address] [place], [name], [address] Phone: [phone]   Fax:  [phone]  Occupational Therapy Treatment  Patient Details  Name: Sharon [name] MRN: [id number] Date of Birth: 05/23/1946 Referring Provider (OT): Eulogio Ditch, NP   Encounter Date: 02/06/2020  OT End of Session - 02/06/20 1649    Visit Number  18    Number of Visits  36    Date for OT Re-Evaluation  02/12/20    OT Start Time  0211    OT Stop Time  0311    OT Time Calculation (min)  60 min    Activity Tolerance  Patient tolerated treatment well;No increased pain    Behavior During Therapy  WFL for tasks assessed/performed       Past Medical History:  Diagnosis Date  . Anxiety   . Arthritis    hands, upper back  . Asthma   . COPD (chronic obstructive pulmonary disease) ([place])   . History of cervical cancer   . Menopausal disorder   . Osteoporosis   . Pneumonia 1960  . Spasm of abdominal muscles of right side    intermittent  . TMJ (dislocation of temporomandibular joint)   . Wears dentures    partial lower    Past Surgical History:  Procedure Laterality Date  . ABDOMINAL HYSTERECTOMY  1970's  . bladder botox  2005  . BLADDER SUSPENSION  2004  . CATARACT EXTRACTION W/PHACO Right 03/09/2018   Procedure: CATARACT EXTRACTION PHACO AND INTRAOCULAR LENS PLACEMENT ([place]) right;  Surgeon: Eulogio Bear, MD;  Location: [place];  Service: Ophthalmology;  Laterality: Right;  CALL CELL 1ST  . CATARACT EXTRACTION W/PHACO Left 04/19/2018   Procedure: CATARACT EXTRACTION PHACO AND INTRAOCULAR LENS PLACEMENT (IOC)  LEFT;  Surgeon: Eulogio Bear, MD;  Location: [place];  Service: Ophthalmology;  Laterality: Left;  . COLONOSCOPY WITH PROPOFOL N/A 12/13/2015   Procedure: COLONOSCOPY WITH PROPOFOL;  Surgeon: Lucilla Lame, MD;  Location: [place];  Service: Endoscopy;  Laterality: N/A;  . LOOP RECORDER  INSERTION N/A 01/07/2018   Procedure: LOOP RECORDER INSERTION;  Surgeon: Deboraha Sprang, MD;  Location: [employer] [facility] [facility];  Service: Cardiovascular;  Laterality: N/A;  . POLYPECTOMY N/A 12/13/2015   Procedure: POLYPECTOMY;  Surgeon: Lucilla Lame, MD;  Location: [place];  Service: Endoscopy;  Laterality: N/A;  SIGMOID COLON POLYPS X  5  . SHOULDER ARTHROSCOPY W/ ROTATOR CUFF REPAIR Right 1998  . TEE WITHOUT CARDIOVERSION N/A 01/06/2018   Procedure: TRANSESOPHAGEAL ECHOCARDIOGRAM (TEE);  Surgeon: Minna Merritts, MD;  Location: [facility];  Service: Cardiovascular;  Laterality: N/A;  . TONSILLECTOMY AND ADENOIDECTOMY      There were no vitals filed for this visit.                OT Treatments/Exercises (OP) - 02/06/20 0001      ADLs   ADL Education Given  Yes      Manual Therapy   Manual Therapy  Edema management    Manual therapy comments  skin care w/ low ph castor oil throughout MLD to increase skin hydration and tissue mobuility/flexibility.    Edema Management  BLE anatomical measurements for OTS    knee length compression garments    Soft tissue mobilization  fibrosis   techniques to bilateral ankles    Manual Lymphatic Drainage (MLD)  MLD to LLE as established    Compression Bandaging  Pt dons  compression socks independently after session.             OT Education - 02/06/20 1648    Education Details  Re-taughht differences between custom flat knit and off the shelf circular knit compression garments re construction and differences in compression and containment    Person(s) Educated  Patient    Methods  Explanation;Demonstration    Comprehension  Verbalized understanding;Returned demonstration;Need further instruction          OT Long Term Goals - 11/22/19 0955      OT LONG TERM GOAL #1   Title  Pt will demonstrate understanding of lymphedema (LE) precautions / prevention principals, including signs / symptoms of cellulitis  infection with modified independence using LE Workbook as printed reference to identify 6 precautions without verbal cues by end of 3rd  OT Rx visit.     Baseline  erbal cues    Time  3    Period  Days    Status  Achieved      OT LONG TERM GOAL #2   Title  Pt will be able todon and doff existing compression garments independently and without assistive devices and/ or extra time for optimal lymphedema self management over time and to limit infection risk.    Baseline  supervision    Time  4    Period  Days    Status  Achieved      OT LONG TERM GOAL #3   Title  Pt to achieve at least  10% limb volume reduction in affected limb(s)  bilaterally during Intensive Phase CDT to control limb swelling, to improve tissue integrity and immune function, to improve ADLs performance and to improve functional mobility/ transfer, and to improve body image and self-esteem.    Baseline  max A    Time  8    Period  Weeks    Status  New      OT LONG TERM GOAL #4   Title  Pt will achieve 100% compliance with daily LE self-care home program components, including daily  skin care, simple self-MLD, gradient compression wraps/ compression garments/devices, and therapeutic exercise with Mod CG support to ensure optimal Intensive Phase limb volume reduction to expedite compression garment/ device fitting.    Baseline  50% compliant    Time  12    Period  Weeks    Status  Partially Met      OT LONG TERM GOAL #5   Title  Pt will use appropriate  assistive devices during LE self-care training to don compression garments/devices with modified independence to ensure optimal LE self-management over time and to limit progression of chronic LE.    Baseline  min A    Time  12    Period  Weeks    Status  Deferred   DC goal     OT LONG TERM GOAL #6   Title  Pt will retain optimal limb volume reductions achieved during Intensive Phase CDT with no more than 3% volume increase with ongoing CG assistance to limit LE  progression and further functional decline.    Baseline  min A    Time  6    Period  Months    Status  New            Plan - 02/06/20 1650    Clinical Impression Statement  Completed anatomical measurements for off the shelf medical grade compression knee highs and spec'd out recommended knee length, ccl 2 (  30-40 mmHg) Juzo SOFT ( 2001) A-D size IV, reg. Pt tolerated MLD to LLE as established without increased pain. Cnt as per OC.       Patient will benefit from skilled therapeutic intervention in order to improve the following deficits and impairments:           Visit Diagnosis: Lymphedema, not elsewhere classified    Problem List Patient Active Problem List   Diagnosis Date Noted  . Vitamin D deficiency 06/28/2019  . Prediabetes 06/28/2019  . GERD (gastroesophageal reflux disease) 12/07/2018  . Vitamin B12 deficiency 12/05/2018  . Senile purpura ([place]) 07/28/2018  . Chronic venous insufficiency 07/14/2018  . Lymphedema 07/14/2018  . Aneurysm of descending thoracic aorta (HCC) 05/04/2018  . Allergic rhinitis 02/16/2018  . Aortic atherosclerosis ([place]) 01/13/2018  . Status post placement of implantable loop recorder 01/12/2018  . Chronic kidney disease, stage 3 01/12/2018  . Cerebrovascular accident (CVA) due to embolism of precerebral artery ([place])   . TIA (transient ischemic attack) 01/04/2018  . Osteoarthritis of wrist 12/09/2017  . CAD (coronary artery disease) 11/25/2017  . Caregiver stress 11/25/2017  . Menopause 08/14/2017  . De Quervain's tenosynovitis, right 07/17/2017  . Stress incontinence 06/19/2017  . Personal history of tobacco use, presenting hazards to health 04/01/2017  . Anxiety 03/13/2017  . Glossitis 11/14/2016  . Advanced care planning/counseling discussion 10/17/2016  . Benign neoplasm of sigmoid colon   . COPD (chronic obstructive pulmonary disease) ([place]) 10/17/2015  . Mass of upper inner quadrant of right breast 06/22/2015    Andrey Spearman, MS, OTR/L, [facility] 02/06/20 4:53 PM  [place] MAIN [facility] SERVICES [address] [place], [name], [address] Phone: [phone]   Fax:  [phone]  Name: ANAELLE DUNTON MRN: [id number] Date of Birth: 1946/04/08

## 2020-02-07 ENCOUNTER — Ambulatory Visit (INDEPENDENT_AMBULATORY_CARE_PROVIDER_SITE_OTHER): Payer: Medicare Other | Admitting: Nurse Practitioner

## 2020-02-07 ENCOUNTER — Other Ambulatory Visit: Payer: Self-pay

## 2020-02-07 ENCOUNTER — Encounter (INDEPENDENT_AMBULATORY_CARE_PROVIDER_SITE_OTHER): Payer: Self-pay | Admitting: Vascular Surgery

## 2020-02-07 VITALS — BP 112/71 | HR 93 | Resp 18 | Ht 69.0 in | Wt 221.0 lb

## 2020-02-07 DIAGNOSIS — I890 Lymphedema, not elsewhere classified: Secondary | ICD-10-CM

## 2020-02-07 DIAGNOSIS — Z636 Dependent relative needing care at home: Secondary | ICD-10-CM | POA: Diagnosis not present

## 2020-02-08 ENCOUNTER — Encounter: Payer: Medicare Other | Admitting: Occupational Therapy

## 2020-02-09 ENCOUNTER — Encounter: Payer: Self-pay | Admitting: Nurse Practitioner

## 2020-02-10 ENCOUNTER — Encounter (INDEPENDENT_AMBULATORY_CARE_PROVIDER_SITE_OTHER): Payer: Self-pay | Admitting: Nurse Practitioner

## 2020-02-10 NOTE — Progress Notes (Signed)
Subjective:    Patient ID: Sharon [name], female    DOB: 03-13-1946, 74 y.o.   MRN: [id number] Chief Complaint  Patient presents with  . Follow-up    3 mos no studies    Patient is following up today for lymphedema as well as recurrent cellulitis.  Since the patient's last office visit her swelling has been currently controlled by her excellent adherence to wearing her compression stockings.  The patient does elevate her lower extremities however it is somewhat difficult sometimes as she is the primary caregiver for her husband who is on hemodialysis.  The patient has also attempted to become more mobile with exercise.  The patient has also been working with the lymphedema clinic and they have also been very helpful in helping the patient control her lower extremity edema.  The patient has not had any other episodes of cellulitis since she was started on prophylactic antibiotics for recurrent cellulitis.  The patient still continues to have some significant stasis dermatitis however this is likely due to her multiple episodes of cellulitis.  Patient denies any weeping or open ulcerations.   Review of Systems  Cardiovascular: Positive for leg swelling.  Psychiatric/Behavioral: The patient is nervous/anxious.   All other systems reviewed and are negative.      Objective:   Physical Exam Vitals reviewed.  Constitutional:      Appearance: Normal appearance.  Cardiovascular:     Rate and Rhythm: Normal rate and regular rhythm.     Pulses: Normal pulses.     Heart sounds: Normal heart sounds.  Pulmonary:     Effort: Pulmonary effort is normal.     Breath sounds: Normal breath sounds.  Musculoskeletal:     Right lower leg: 1+ Edema present.     Left lower leg: 1+ Edema present.  Skin:    General: Skin is warm and dry.     Comments: Bilateral stasis dermatitis  Neurological:     Mental Status: She is alert and oriented to person, place, and time.  Psychiatric:        Mood and  Affect: Mood normal.        Behavior: Behavior normal.        Thought Content: Thought content normal.        Judgment: Judgment normal.     BP 112/71 (BP Location: Right Arm)   Pulse 93   Resp 18   Ht 5' 9"  (1.753 m)   Wt 221 lb (100.2 kg)   LMP  (LMP Unknown)   BMI 32.64 kg/m   Past Medical History:  Diagnosis Date  . Anxiety   . Arthritis    hands, upper back  . Asthma   . COPD (chronic obstructive pulmonary disease) ([place])   . History of cervical cancer   . Menopausal disorder   . Osteoporosis   . Pneumonia 1960  . Spasm of abdominal muscles of right side    intermittent  . TMJ (dislocation of temporomandibular joint)   . Wears dentures    partial lower    Social History   Socioeconomic History  . Marital status: Married    Spouse name: Ermal Brzozowski  . Number of children: 1  . Years of education: Not on file  . Highest education level: Some college, no degree  Occupational History  . Occupation: retired  Tobacco Use  . Smoking status: Former Smoker    Years: 56.00    Types: Cigarettes    Quit date: 07/14/2016  Years since quitting: 3.5  . Smokeless tobacco: Never Used  Substance and Sexual Activity  . Alcohol use: Yes    Alcohol/week: 0.0 standard drinks  . Drug use: No  . Sexual activity: Not Currently    Birth control/protection: Post-menopausal  Other Topics Concern  . 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 Difficulty of Paying Living Expenses:   Food Insecurity:   . Worried About Charity fundraiser in the Last Year:   . Arboriculturist in the Last Year:   Transportation Needs:   . Film/video editor (Medical):   Marland Kitchen Lack of Transportation (Non-Medical):   Physical Activity:   . Days of Exercise per Week:   . Minutes of Exercise per Session:   Stress:   . Feeling of Stress :   Social Connections:   . Frequency of Communication with  Friends and Family:   . Frequency of Social Gatherings with Friends and Family:   . Attends Religious Services:   . Active Member of Clubs or Organizations:   . Attends Archivist Meetings:   Marland Kitchen Marital Status:   Intimate Partner Violence:   . Fear of Current or Ex-Partner:   . Emotionally Abused:   Marland Kitchen Physically Abused:   . Sexually Abused:     Past Surgical History:  Procedure Laterality Date  . ABDOMINAL HYSTERECTOMY  1970's  . bladder botox  2005  . BLADDER SUSPENSION  2004  . CATARACT EXTRACTION W/PHACO Right 03/09/2018   Procedure: CATARACT EXTRACTION PHACO AND INTRAOCULAR LENS PLACEMENT ([place]) right;  Surgeon: Eulogio Bear, MD;  Location: [place];  Service: Ophthalmology;  Laterality: Right;  CALL CELL 1ST  . CATARACT EXTRACTION W/PHACO Left 04/19/2018   Procedure: CATARACT EXTRACTION PHACO AND INTRAOCULAR LENS PLACEMENT (IOC)  LEFT;  Surgeon: Eulogio Bear, MD;  Location: [place];  Service: Ophthalmology;  Laterality: Left;  . COLONOSCOPY WITH PROPOFOL N/A 12/13/2015   Procedure: COLONOSCOPY WITH PROPOFOL;  Surgeon: Lucilla Lame, MD;  Location: [place];  Service: Endoscopy;  Laterality: N/A;  . LOOP RECORDER INSERTION N/A 01/07/2018   Procedure: LOOP RECORDER INSERTION;  Surgeon: Deboraha Sprang, MD;  Location: [place] [facility];  Service: Cardiovascular;  Laterality: N/A;  . POLYPECTOMY N/A 12/13/2015   Procedure: POLYPECTOMY;  Surgeon: Lucilla Lame, MD;  Location: [place];  Service: Endoscopy;  Laterality: N/A;  SIGMOID COLON POLYPS X  5  . SHOULDER ARTHROSCOPY W/ ROTATOR CUFF REPAIR Right 1998  . TEE WITHOUT CARDIOVERSION N/A 01/06/2018   Procedure: TRANSESOPHAGEAL ECHOCARDIOGRAM (TEE);  Surgeon: Minna Merritts, MD;  Location: [facility];  Service: Cardiovascular;  Laterality: N/A;  . TONSILLECTOMY AND ADENOIDECTOMY      Family History  Problem Relation Age of Onset  . Diabetes Mother   . Heart disease Mother     . Stroke Mother   . Stroke Maternal Grandmother     Allergies  Allergen Reactions  . Baclofen Swelling  . Bactrim [Sulfamethoxazole-Trimethoprim] Nausea And Vomiting  . Ibuprofen Rash    Mouth swelling  . Latex Rash    Some bandaids, some gloves; BLOOD TEST NEGATIVE  . Librium [Chlordiazepoxide] Itching    Dizziness   . Naprosyn [Naproxen] Rash    Mouth swelling  . Other Rash    [place] nuts - mouth swelling       Assessment &  Plan:   1. Lymphedema The patient is doing well with conservative therapy in addition to working with the lymphedema clinic.  She has been diligent with wearing her medical grade 1 compression stockings.  There could be some improvement with her elevation however the patient does try to exercise as well.  We will continue the patient on prophylactic antibiotics related to her recurrent cellulitis.  The patient has had no issues with this.  Patient is also advised to begin trying to utilize her lymphedema pump initially for about 24mnutes/day.  Provided the patient has no issues, we will have the patient follow-up in office in 6 months.  2. Caregiver stress Patient is advised to try to take time to rest and elevate her lower extremities.  Since last visit the patient has been trying to be more diligent about this and is presently working towards it.   Current Outpatient Medications on File Prior to Visit  Medication Sig Dispense Refill  . albuterol (PROAIR HFA) 108 (90 Base) MCG/ACT inhaler Inhale 2 puffs into the lungs every 6 (six) hours as needed for wheezing or shortness of breath. 8.5 Inhaler 12  . atorvastatin (LIPITOR) 40 MG tablet TAKE 1 TABLET BY MOUTH ONCE A DAY AT 6 PM 90 tablet 3  . budesonide-formoterol (SYMBICORT) 160-4.5 MCG/ACT inhaler USE 2 PUFFS BY MOUTH TWICE DAILY. 10.2 g 12  . citalopram (CELEXA) 20 MG tablet Take 1 tablet (20 mg total) by mouth daily. 90 tablet 3  . clopidogrel (PLAVIX) 75 MG tablet TAKE 1 TABLET(75 MG) BY MOUTH DAILY  90 tablet 3  . Cyanocobalamin 1000 MCG/ML KIT Inject 1,000 mcg as directed every 30 (thirty) days.    .Marland Kitchenestradiol (VIVELLE-DOT) 0.1 MG/24HR patch APPLY 1 PATCH ONTO THE SKIN 2 TIMES A WEEK 8 patch 11  . Loratadine (CLARITIN PO) Take by mouth daily.    . montelukast (SINGULAIR) 10 MG tablet Take 1 tablet (10 mg total) by mouth at bedtime. 90 tablet 3  . pantoprazole (PROTONIX) 40 MG tablet Take 1 tablet (40 mg total) by mouth daily. 90 tablet 3  . penicillin v potassium (VEETID) 250 MG tablet Take 1 tablet (250 mg total) by mouth 2 (two) times daily. 60 tablet 3  . benzonatate (TESSALON PERLES) 100 MG capsule Take 1 capsule (100 mg total) by mouth 3 (three) times daily as needed for cough. (Patient not taking: Reported on 11/10/2019) 45 capsule 0  . meclizine (ANTIVERT) 12.5 MG tablet Take 1 tablet (12.5 mg total) by mouth 3 (three) times daily as needed for dizziness (only if needed). (Patient not taking: Reported on 02/07/2020) 30 tablet 0  . Multiple Vitamin (MULTIVITAMIN) tablet Take 2 tablets by mouth daily.     .Marland Kitchentriamcinolone cream (KENALOG) 0.1 % Apply 1 application topically 2 (two) times daily. (Patient not taking: Reported on 02/07/2020) 30 g 0   No current facility-administered medications on file prior to visit.    There are no Patient Instructions on file for this visit. No follow-ups on file.   FKris Hartmann NP

## 2020-02-13 ENCOUNTER — Other Ambulatory Visit (INDEPENDENT_AMBULATORY_CARE_PROVIDER_SITE_OTHER): Payer: Self-pay | Admitting: Nurse Practitioner

## 2020-02-13 ENCOUNTER — Encounter (INDEPENDENT_AMBULATORY_CARE_PROVIDER_SITE_OTHER): Payer: Self-pay

## 2020-02-13 ENCOUNTER — Ambulatory Visit: Payer: Medicare Other | Attending: Nurse Practitioner | Admitting: Occupational Therapy

## 2020-02-13 ENCOUNTER — Other Ambulatory Visit: Payer: Self-pay

## 2020-02-13 DIAGNOSIS — I890 Lymphedema, not elsewhere classified: Secondary | ICD-10-CM | POA: Insufficient documentation

## 2020-02-13 DIAGNOSIS — L03119 Cellulitis of unspecified part of limb: Secondary | ICD-10-CM

## 2020-02-13 MED ORDER — PENICILLIN V POTASSIUM 250 MG PO TABS: 250.0000 mg | ORAL_TABLET | Freq: Two times a day (BID) | ORAL | 3 refills | Status: DC

## 2020-02-13 NOTE — Therapy (Signed)
[place] MAIN [facility] SERVICES [address] [place], [name], [address] Phone: [phone]   Fax:  [phone]  Occupational Therapy Treatment  Patient Details  Name: Sharon [name] MRN: [id number] Date of Birth: 11-Feb-1946 Referring Provider (OT): Eulogio Ditch, NP   Encounter Date: 02/13/2020  OT End of Session - 02/13/20 1314    Visit Number  19    Number of Visits  36    Date for OT Re-Evaluation  02/12/20    OT Start Time  0105    OT Stop Time  0210    OT Time Calculation (min)  65 min    Activity Tolerance  Patient tolerated treatment well;No increased pain    Behavior During Therapy  WFL for tasks assessed/performed       Past Medical History:  Diagnosis Date  . Anxiety   . Arthritis    hands, upper back  . Asthma   . COPD (chronic obstructive pulmonary disease) ([place])   . History of cervical cancer   . Menopausal disorder   . Osteoporosis   . Pneumonia 1960  . Spasm of abdominal muscles of right side    intermittent  . TMJ (dislocation of temporomandibular joint)   . Wears dentures    partial lower    Past Surgical History:  Procedure Laterality Date  . ABDOMINAL HYSTERECTOMY  1970's  . bladder botox  2005  . BLADDER SUSPENSION  2004  . CATARACT EXTRACTION W/PHACO Right 03/09/2018   Procedure: CATARACT EXTRACTION PHACO AND INTRAOCULAR LENS PLACEMENT ([place]rez) right;  Surgeon: Eulogio Bear, MD;  Location: [place];  Service: Ophthalmology;  Laterality: Right;  CALL CELL 1ST  . CATARACT EXTRACTION W/PHACO Left 04/19/2018   Procedure: CATARACT EXTRACTION PHACO AND INTRAOCULAR LENS PLACEMENT (IOC)  LEFT;  Surgeon: Eulogio Bear, MD;  Location: [place];  Service: Ophthalmology;  Laterality: Left;  . COLONOSCOPY WITH PROPOFOL N/A 12/13/2015   Procedure: COLONOSCOPY WITH PROPOFOL;  Surgeon: Lucilla Lame, MD;  Location: [place];  Service: Endoscopy;  Laterality: N/A;  . LOOP RECORDER INSERTION  N/A 01/07/2018   Procedure: LOOP RECORDER INSERTION;  Surgeon: Deboraha Sprang, MD;  Location: [place] [facility];  Service: Cardiovascular;  Laterality: N/A;  . POLYPECTOMY N/A 12/13/2015   Procedure: POLYPECTOMY;  Surgeon: Lucilla Lame, MD;  Location: [place];  Service: Endoscopy;  Laterality: N/A;  SIGMOID COLON POLYPS X  5  . SHOULDER ARTHROSCOPY W/ ROTATOR CUFF REPAIR Right 1998  . TEE WITHOUT CARDIOVERSION N/A 01/06/2018   Procedure: TRANSESOPHAGEAL ECHOCARDIOGRAM (TEE);  Surgeon: Minna Merritts, MD;  Location: [facility];  Service: Cardiovascular;  Laterality: N/A;  . TONSILLECTOMY AND ADENOIDECTOMY      There were no vitals filed for this visit.  Subjective Assessment - 02/13/20 1315    Subjective   [name] for OT visit 39 of 36 to address BLE Lymphedema. Pt has no new complaints. Tightness in ankles , feet and legs are unchanged. Not rated numerically today. Pt reports she did order replacement compression garments.    Pertinent History  Hx anxiety, COPD, OA, Asthma, hx cervical ca, osteoporosis, hx pneumonia, hx CVA, hx LLE cellulitis, hx L leg wound, hx falls    Limitations  chronic BLE leg swelling and associated pain, decreased balance, fall risk, decreased standing, walking and sitting tolerance, difficulty w/ lower body bathing and dressing, difficulty fitting LB clothing and shoes, unable to drive, impaired transfers and functional mobility, impaired social  participation, impaired ability to perform household chores, cooking, meal prep    Currently in Pain?  Yes    Pain Score  4     Pain Location  Leg    Pain Orientation  Left;Right    Pain Descriptors / Indicators  Aching;Tiring;Heaviness;Sore;Tightness;Pressure;Tender    Pain Type  Chronic pain    Pain Onset  More than a month ago                   OT Treatments/Exercises (OP) - 02/13/20 0001      ADLs   ADL Education Given  Yes  (Pended)       Manual Therapy   Manual Therapy  Edema  management  (Pended)     Manual therapy comments  skin care w/ low ph castor oil throughout MLD to increase skin hydration and tissue mobuility/flexibility.  (Pended)     Edema Management  BLE anatomical measurements for OTS    knee length compression garments  (Pended)     Soft tissue mobilization  fibrosis   techniques to bilateral ankles  (Pended)     Manual Lymphatic Drainage (MLD)  MLD to LLE as established  (Pended)     Compression Bandaging  Pt dons         compression socks independently after session.  (Pended)              OT Education - 02/13/20 1736    Education Details  Continued skilled Pt/caregiver education  And LE ADL training throughout visit for lymphedema self care/ home program, including compression wrapping, compression garment and device wear/care, lymphatic pumping ther ex, simple self-MLD, and skin care. Discussed progress towards goals.    Person(s) Educated  Patient    Methods  Explanation;Demonstration    Comprehension  Verbalized understanding;Returned demonstration          OT Long Term Goals - 11/22/19 0955      OT LONG TERM GOAL #1   Title  Pt will demonstrate understanding of lymphedema (LE) precautions / prevention principals, including signs / symptoms of cellulitis infection with modified independence using LE Workbook as printed reference to identify 6 precautions without verbal cues by end of 3rd  OT Rx visit.     Baseline  erbal cues    Time  3    Period  Days    Status  Achieved      OT LONG TERM GOAL #2   Title  Pt will be able todon and doff existing compression garments independently and without assistive devices and/ or extra time for optimal lymphedema self management over time and to limit infection risk.    Baseline  supervision    Time  4    Period  Days    Status  Achieved      OT LONG TERM GOAL #3   Title  Pt to achieve at least  10% limb volume reduction in affected limb(s)  bilaterally during Intensive Phase CDT to  control limb swelling, to improve tissue integrity and immune function, to improve ADLs performance and to improve functional mobility/ transfer, and to improve body image and self-esteem.    Baseline  max A    Time  8    Period  Weeks    Status  New      OT LONG TERM GOAL #4   Title  Pt will achieve 100% compliance with daily LE self-care home program components, including daily  skin care, simple self-MLD, gradient  compression wraps/ compression garments/devices, and therapeutic exercise with Mod CG support to ensure optimal Intensive Phase limb volume reduction to expedite compression garment/ device fitting.    Baseline  50% compliant    Time  12    Period  Weeks    Status  Partially Met      OT LONG TERM GOAL #5   Title  Pt will use appropriate  assistive devices during LE self-care training to don compression garments/devices with modified independence to ensure optimal LE self-management over time and to limit progression of chronic LE.    Baseline  min A    Time  12    Period  Weeks    Status  Deferred   DC goal     OT LONG TERM GOAL #6   Title  Pt will retain optimal limb volume reductions achieved during Intensive Phase CDT with no more than 3% volume increase with ongoing CG assistance to limit LE progression and further functional decline.    Baseline  min A    Time  6    Period  Months    Status  New            Plan - 02/13/20 1737    Clinical Impression Statement  Provided MLD and skin  bilaterally w/ emphasis on LLE. After fibrosis techniques and soft tissue mobilization  Pt has improved flexibility in dorsiflexion and plantar flexion . Limd swelling is mildly increased initially, but after MLD skin creases at cY is resolved. Cont as per POC.       Patient will benefit from skilled therapeutic intervention in order to improve the following deficits and impairments:           Visit Diagnosis: Lymphedema, not elsewhere classified    Problem  List Patient Active Problem List   Diagnosis Date Noted  . Vitamin D deficiency 06/28/2019  . Prediabetes 06/28/2019  . GERD (gastroesophageal reflux disease) 12/07/2018  . Vitamin B12 deficiency 12/05/2018  . Senile purpura ([place]) 07/28/2018  . Chronic venous insufficiency 07/14/2018  . Lymphedema 07/14/2018  . Aneurysm of descending thoracic aorta (HCC) 05/04/2018  . Allergic rhinitis 02/16/2018  . Aortic atherosclerosis ([place]) 01/13/2018  . Status post placement of implantable loop recorder 01/12/2018  . Chronic kidney disease, stage 3 01/12/2018  . History of CVA (cerebrovascular accident)   . TIA (transient ischemic attack) 01/04/2018  . CAD (coronary artery disease) 11/25/2017  . Caregiver stress 11/25/2017  . Menopause 08/14/2017  . De Quervain's tenosynovitis, right 07/17/2017  . Stress incontinence 06/19/2017  . Personal history of tobacco use, presenting hazards to health 04/01/2017  . Anxiety 03/13/2017  . Advanced care planning/counseling discussion 10/17/2016  . Benign neoplasm of sigmoid colon   . Centrilobular emphysema ([place]) 10/17/2015  . Mass of upper inner quadrant of right breast 06/22/2015     Andrey Spearman, MS, OTR/L, [facility] 02/13/20 5:40 PM   [place] MAIN [facility] SERVICES [address] [place], [name], [address] Phone: [phone]   Fax:  [phone]  Name: Sharon [name] MRN: [id number] Date of Birth: [birth year]

## 2020-02-14 ENCOUNTER — Encounter: Payer: Self-pay | Admitting: Nurse Practitioner

## 2020-02-14 ENCOUNTER — Other Ambulatory Visit: Payer: Self-pay

## 2020-02-14 ENCOUNTER — Ambulatory Visit (INDEPENDENT_AMBULATORY_CARE_PROVIDER_SITE_OTHER): Payer: Medicare Other | Admitting: Nurse Practitioner

## 2020-02-14 VITALS — BP 130/78 | HR 97 | Temp 98.5°F | Wt 220.0 lb

## 2020-02-14 DIAGNOSIS — E538 Deficiency of other specified B group vitamins: Secondary | ICD-10-CM | POA: Diagnosis not present

## 2020-02-14 DIAGNOSIS — E1169 Type 2 diabetes mellitus with other specified complication: Secondary | ICD-10-CM | POA: Insufficient documentation

## 2020-02-14 DIAGNOSIS — D692 Other nonthrombocytopenic purpura: Secondary | ICD-10-CM | POA: Diagnosis not present

## 2020-02-14 DIAGNOSIS — Z6832 Body mass index (BMI) 32.0-32.9, adult: Secondary | ICD-10-CM

## 2020-02-14 DIAGNOSIS — E6609 Other obesity due to excess calories: Secondary | ICD-10-CM

## 2020-02-14 DIAGNOSIS — E669 Obesity, unspecified: Secondary | ICD-10-CM | POA: Insufficient documentation

## 2020-02-14 DIAGNOSIS — E559 Vitamin D deficiency, unspecified: Secondary | ICD-10-CM

## 2020-02-14 DIAGNOSIS — I2510 Atherosclerotic heart disease of native coronary artery without angina pectoris: Secondary | ICD-10-CM | POA: Diagnosis not present

## 2020-02-14 DIAGNOSIS — E785 Hyperlipidemia, unspecified: Secondary | ICD-10-CM | POA: Insufficient documentation

## 2020-02-14 DIAGNOSIS — R7303 Prediabetes: Secondary | ICD-10-CM | POA: Diagnosis not present

## 2020-02-14 DIAGNOSIS — E782 Mixed hyperlipidemia: Secondary | ICD-10-CM | POA: Diagnosis not present

## 2020-02-14 MED ORDER — ROSUVASTATIN CALCIUM 20 MG PO TABS: 20.0000 mg | ORAL_TABLET | ORAL | 3 refills | Status: DC

## 2020-02-14 MED ORDER — CYANOCOBALAMIN 1000 MCG/ML IJ SOLN
1000.0000 ug | Freq: Once | INTRAMUSCULAR | Status: AC
  Administered 2020-02-14: 1000 ug via INTRAMUSCULAR

## 2020-02-14 NOTE — Assessment & Plan Note (Signed)
Recheck Vitamin D level today.  Recommend daily Vitamin D3 supplement, 1000 units.

## 2020-02-14 NOTE — Assessment & Plan Note (Signed)
Chronic, ongoing.  Will trial Rosuvastatin 20 MG three days a week, script sent.  If muscle cramps present then discussed with patient to try once a week and if ongoing muscle pain to alert PCP and would then consider PSK9 -- educated her on this.  Check lipid panel and CMP today.

## 2020-02-14 NOTE — Progress Notes (Addendum)
BP 130/78   Pulse 97   Temp 98.5 F (36.9 C) (Oral)   Wt 220 lb (99.8 kg)   LMP  (LMP Unknown)   SpO2 96%   BMI 32.49 kg/m    Subjective:    Patient ID: Sharon [name], female    DOB: [birth year], 74 y.o.   MRN: [id number]  HPI: Sharon [name] is a 74 y.o. female  Chief Complaint  Patient presents with  . Follow-up  . Hyperlipidemia    pt states the atorvastatin has been making her feel bad    HYPERLIPIDEMIA Reports wishes to stop taking Atorvastatin due to it making her feel bad with muscle pains.  Discussed at length with CCM PharmD and provider today various alternate options, she is interested in trying Crestor three times a week.   Hyperlipidemia status: fair compliance Satisfied with current treatment?  yes Side effects:  no Medication compliance: fair compliance Past cholesterol meds: Atorvastatin Supplements: none Aspirin:  no The ASCVD Risk score Mikey Bussing DC Jr., et al., 2013) failed to calculate for the following reasons:   The patient has a prior MI or stroke diagnosis Chest pain:  no Coronary artery disease:  no Family history CAD:  no Family history early CAD:  no   PREDIABETES A1C 6.6% in September, had been treated with multiple rounds of Prednisone due to cellulitis.  Has not taken this since last year.  Reports not eating a lot of sweets, but does eat Frosted Flakes for breakfast 3 days a week.  She is frustrated with weight and reports someone at chiropractor told her "my belly weight was my liver", reviewed recent LFT with her. Polydipsia/polyuria: no Visual disturbance: no Chest pain: no Paresthesias: no  B12 DEFICIENCY and VITAMIN D DEFICIENCY: Currently obtains B12 injections, her recent B12 level in April 2021 was 1,194, H/H 15/43.6.  On last check of her Vitamin D level was 18.1, to continue on daily supplement.  Last DEXA in 2018 -- noted osteopenia.  Reports noticing immediate lip pain and issues if missed B12 injections and did not tolerate oral  supplement.  Relevant past medical, surgical, family and social history reviewed and updated as indicated. Interim medical history since our last visit reviewed. Allergies and medications reviewed and updated.  Review of Systems  Constitutional: Negative for activity change, appetite change, diaphoresis, fatigue and fever.  Respiratory: Negative for cough, chest tightness and shortness of breath.   Cardiovascular: Negative for chest pain, palpitations and leg swelling.  Gastrointestinal: Negative.   Endocrine: Negative for polydipsia, polyphagia and polyuria.  Neurological: Negative.   Psychiatric/Behavioral: Negative.     Per HPI unless specifically indicated above     Objective:    BP 130/78   Pulse 97   Temp 98.5 F (36.9 C) (Oral)   Wt 220 lb (99.8 kg)   LMP  (LMP Unknown)   SpO2 96%   BMI 32.49 kg/m   Wt Readings from Last 3 Encounters:  02/14/20 220 lb (99.8 kg)  02/07/20 221 lb (100.2 kg)  01/03/20 219 lb (99.3 kg)    Physical Exam Vitals and nursing note reviewed.  Constitutional:      General: She is awake. She is not in acute distress.    Appearance: She is well-developed. She is obese. She is not ill-appearing.  HENT:     Head: Normocephalic.     Right Ear: Hearing normal.     Left Ear: Hearing normal.  Eyes:     General: Lids are  normal.        Right eye: No discharge.        Left eye: No discharge.     Conjunctiva/sclera: Conjunctivae normal.     Pupils: Pupils are equal, round, and reactive to light.  Neck:     Thyroid: No thyromegaly.     Vascular: No carotid bruit.  Cardiovascular:     Rate and Rhythm: Normal rate and regular rhythm.     Heart sounds: Normal heart sounds. No murmur. No gallop.   Pulmonary:     Effort: Pulmonary effort is normal. No accessory muscle usage or respiratory distress.     Breath sounds: Normal breath sounds.  Abdominal:     General: Bowel sounds are normal.     Palpations: Abdomen is soft.  Musculoskeletal:      Cervical back: Normal range of motion and neck supple.     Right lower leg: 1+ Pitting Edema present.     Left lower leg: 1+ Pitting Edema present.  Skin:    General: Skin is warm and dry.     Comments: Scattered pale purple bruising to bilateral upper extremities.  Neurological:     Mental Status: She is alert and oriented to person, place, and time.  Psychiatric:        Attention and Perception: Attention normal.        Mood and Affect: Mood normal.        Speech: Speech normal.        Behavior: Behavior normal. Behavior is cooperative.        Thought Content: Thought content normal.    Results for orders placed or performed in visit on 01/30/20  CUP PACEART REMOTE DEVICE CHECK  Result Value Ref Range   Date Time Interrogation Session [id number]    Pulse Generator Manufacturer MERM    Pulse Gen Model [id number] Reveal LINQ    Pulse Gen Serial Number [place] [facility]    Implantable Pulse Generator Type ICM/ILR    Implantable Pulse Generator Implant Date [id number]       Assessment & Plan:   Problem List Items Addressed This Visit      Cardiovascular and Mediastinum   Senile purpura ([place]) - Primary    On daily Plavix.  Recommend cleansing skin with gentle cleanser and use of daily lotion.  Monitor for skin breakdown and address if present.      Relevant Medications   rosuvastatin (CRESTOR) 20 MG tablet (Start on 02/15/2020)     Other   Vitamin B12 deficiency    Continue monthly injections, labs performed in April show normal B12 level.      Vitamin D deficiency    Recheck Vitamin D level today.  Recommend daily Vitamin D3 supplement, 1000 units.      Prediabetes    Will recheck A1C today and recommend focus on diet and regular exercise.  Did have recent Prednisone use frequently during previous A1C.        Relevant Orders   HgB A1c   Hyperlipidemia    Chronic, ongoing.  Will trial Rosuvastatin 20 MG three days a week, script sent.  If muscle  cramps present then discussed with patient to try once a week and if ongoing muscle pain to alert PCP and would then consider PSK9 -- educated her on this.  Check lipid panel and CMP today.      Relevant Medications   rosuvastatin (CRESTOR) 20 MG tablet (Start on 02/15/2020)  Other Relevant Orders   Lipid Panel w/o Chol/HDL Ratio   Comprehensive metabolic panel   Obesity    Recommended eating smaller high protein, low fat meals more frequently and exercising 30 mins a day 5 times a week with a goal of 10-15lb weight loss in the next 3 months. Patient voiced their understanding and motivation to adhere to these recommendations.         Time: 25 minutes, >50% spent counseling and lengthy education diabetes, weight management, and statin use    Follow up plan: Return in about 5 months (around 07/16/2020) for Annual physical.

## 2020-02-14 NOTE — Assessment & Plan Note (Signed)
Continue monthly injections, labs performed in April show normal B12 level.

## 2020-02-14 NOTE — Assessment & Plan Note (Signed)
On daily Plavix.  Recommend cleansing skin with gentle cleanser and use of daily lotion.  Monitor for skin breakdown and address if present. 

## 2020-02-14 NOTE — Assessment & Plan Note (Signed)
Recommended eating smaller high protein, low fat meals more frequently and exercising 30 mins a day 5 times a week with a goal of 10-15lb weight loss in the next 3 months. Patient voiced their understanding and motivation to adhere to these recommendations.  

## 2020-02-14 NOTE — Patient Instructions (Signed)

## 2020-02-14 NOTE — Assessment & Plan Note (Signed)
Will recheck A1C today and recommend focus on diet and regular exercise.  Did have recent Prednisone use frequently during previous A1C.

## 2020-02-15 ENCOUNTER — Encounter: Payer: Self-pay | Admitting: Nurse Practitioner

## 2020-02-15 ENCOUNTER — Ambulatory Visit: Payer: Medicare Other | Admitting: Occupational Therapy

## 2020-02-15 DIAGNOSIS — I890 Lymphedema, not elsewhere classified: Secondary | ICD-10-CM | POA: Diagnosis not present

## 2020-02-15 LAB — COMPREHENSIVE METABOLIC PANEL
ALT: 21 IU/L (ref 0–32)
AST: 22 IU/L (ref 0–40)
Albumin/Globulin Ratio: 2.1 (ref 1.2–2.2)
Albumin: 4.5 g/dL (ref 3.7–4.7)
Alkaline Phosphatase: 91 IU/L (ref 39–117)
BUN/Creatinine Ratio: 16 (ref 12–28)
BUN: 16 mg/dL (ref 8–27)
Bilirubin Total: 0.4 mg/dL (ref 0.0–1.2)
CO2: 24 mmol/L (ref 20–29)
Calcium: 9.4 mg/dL (ref 8.7–10.3)
Chloride: 102 mmol/L (ref 96–106)
Creatinine, Ser: 0.99 mg/dL (ref 0.57–1.00)
GFR calc Af Amer: 65 mL/min/{1.73_m2} (ref >59)
GFR calc non Af Amer: 56 mL/min/{1.73_m2} — ABNORMAL LOW (ref >59)
Globulin, Total: 2.1 g/dL (ref 1.5–4.5)
Glucose: 101 mg/dL — ABNORMAL HIGH (ref 65–99)
Potassium: 4.4 mmol/L (ref 3.5–5.2)
Sodium: 141 mmol/L (ref 134–144)
Total Protein: 6.6 g/dL (ref 6.0–8.5)

## 2020-02-15 LAB — HEMOGLOBIN A1C
Est. average glucose Bld gHb Est-mCnc: 140 mg/dL
Hgb A1c MFr Bld: 6.5 % — ABNORMAL HIGH (ref 4.8–5.6)

## 2020-02-15 LAB — LIPID PANEL W/O CHOL/HDL RATIO
Cholesterol, Total: 147 mg/dL (ref 100–199)
HDL: 60 mg/dL (ref >39)
LDL Chol Calc (NIH): 68 mg/dL (ref 0–99)
Triglycerides: 106 mg/dL (ref 0–149)
VLDL Cholesterol Cal: 19 mg/dL (ref 5–40)

## 2020-02-15 NOTE — Progress Notes (Signed)
Contacted via [place] [facility] [place].  Your labs have returned.  Cholesterol levels look great!!  Electrolytes look good and kidney function is remaining stable with some very mild kidney disease, some improved from previous visit.  Liver testing normal.  A1C, diabetes testing, is coming down at 6.5%, previous was 6.6%, but it is still in diabetes range of 6.5 or greater.  I recommend continues focus on diet changes at home with less sugar intake and carbohydrates.  Any questions? Keep being awesome!! Kindest regards, Brentlee Sciara

## 2020-02-15 NOTE — Therapy (Addendum)
[place] MAIN [facility] SERVICES [address] [place], [name], [address] Phone: [phone]   Fax:  [phone]  Occupational Therapy Treatment Note and Progress Report: Lymphedema Care  Patient Details  Name: Sharon [name] MRN: [id number] Date of Birth: 09-25-1946 Referring Provider (OT): Eulogio Ditch, NP   Encounter Date: 02/15/2020  OT End of Session - 02/15/20 1610    Visit Number  20    Number of Visits  36    Date for OT Re-Evaluation  02/12/20    OT Start Time  0308    OT Stop Time  0411    OT Time Calculation (min)  63 min    Activity Tolerance  Patient tolerated treatment well;No increased pain    Behavior During Therapy  WFL for tasks assessed/performed       Past Medical History:  Diagnosis Date  . Anxiety   . Arthritis    hands, upper back  . Asthma   . COPD (chronic obstructive pulmonary disease) ([place])   . History of cervical cancer   . Menopausal disorder   . Osteoporosis   . Pneumonia 1960  . Spasm of abdominal muscles of right side    intermittent  . TMJ (dislocation of temporomandibular joint)   . Wears dentures    partial lower    Past Surgical History:  Procedure Laterality Date  . ABDOMINAL HYSTERECTOMY  1970's  . bladder botox  2005  . BLADDER SUSPENSION  2004  . CATARACT EXTRACTION W/PHACO Right 03/09/2018   Procedure: CATARACT EXTRACTION PHACO AND INTRAOCULAR LENS PLACEMENT ([place]) right;  Surgeon: Eulogio Bear, MD;  Location: [place];  Service: Ophthalmology;  Laterality: Right;  CALL CELL 1ST  . CATARACT EXTRACTION W/PHACO Left 04/19/2018   Procedure: CATARACT EXTRACTION PHACO AND INTRAOCULAR LENS PLACEMENT (IOC)  LEFT;  Surgeon: Eulogio Bear, MD;  Location: [place];  Service: Ophthalmology;  Laterality: Left;  . COLONOSCOPY WITH PROPOFOL N/A 12/13/2015   Procedure: COLONOSCOPY WITH PROPOFOL;  Surgeon: Lucilla Lame, MD;  Location: [place];  Service: Endoscopy;   Laterality: N/A;  . LOOP RECORDER INSERTION N/A 01/07/2018   Procedure: LOOP RECORDER INSERTION;  Surgeon: Deboraha Sprang, MD;  Location: [place] [facility];  Service: Cardiovascular;  Laterality: N/A;  . POLYPECTOMY N/A 12/13/2015   Procedure: POLYPECTOMY;  Surgeon: Lucilla Lame, MD;  Location: [place];  Service: Endoscopy;  Laterality: N/A;  SIGMOID COLON POLYPS X  5  . SHOULDER ARTHROSCOPY W/ ROTATOR CUFF REPAIR Right 1998  . TEE WITHOUT CARDIOVERSION N/A 01/06/2018   Procedure: TRANSESOPHAGEAL ECHOCARDIOGRAM (TEE);  Surgeon: Minna Merritts, MD;  Location: [facility];  Service: Cardiovascular;  Laterality: N/A;  . TONSILLECTOMY AND ADENOIDECTOMY      There were no vitals filed for this visit.  Subjective Assessment - 02/15/20 1514    Subjective   [name] for OT visit 20 of 36 to address BLE Lymphedema. Pt reports leg pain is unchanged since last visit. Pt reports she saw her primary doctor who is replacing Lipitor with Crestor  3 x weekly in an effort to decrease side effects.    Pertinent History  Hx anxiety, COPD, OA, Asthma, hx cervical ca, osteoporosis, hx pneumonia, hx CVA, hx LLE cellulitis, hx L leg wound, hx falls    Limitations  chronic BLE leg swelling and associated pain, decreased balance, fall risk, decreased standing, walking and sitting tolerance, difficulty w/ lower body bathing and dressing, difficulty fitting LB  clothing and shoes, unable to drive, impaired transfers and functional mobility, impaired social participation, impaired ability to perform household chores, cooking, meal prep    Pain Onset  More than a month ago          LYMPHEDEMA/ONCOLOGY QUESTIONNAIRE - 02/15/20 1613      Left Lower Extremity Lymphedema   Other  LLE limb volume from A-D  = 3180.63 ml ml.     Other  LLE is decreased by 1.05% since last measured on 12/22/19. Limb volume differential is decreased from 4.5% to 1.05%.              OT Treatments/Exercises (OP) -  02/15/20 0001      ADLs   ADL Education Given  Yes      Manual Therapy   Manual Therapy  Edema management    Manual therapy comments  skin care w/ low ph castor oil throughout MLD to increase skin hydration and tissue mobuility/flexibility.    Edema Management  LLE comparative limb volumetrics    Soft tissue mobilization  fibrosis   techniques to LLE during MLD    Manual Lymphatic Drainage (MLD)  MLD to LLE as established    Compression Bandaging  Pt dons         compression socks independently after session.             OT Education - 02/15/20 1610    Education Details  Pt edu for outcome of comparative limb volumetrics for LLE    Person(s) Educated  Patient    Methods  Explanation;Demonstration    Comprehension  Verbalized understanding;Returned demonstration          OT Long Term Goals - 02/15/20 1623      OT LONG TERM GOAL #1   Title  Pt will demonstrate understanding of lymphedema (LE) precautions / prevention principals, including signs / symptoms of cellulitis infection with modified independence using LE Workbook as printed reference to identify 6 precautions without verbal cues by end of 3rd  OT Rx visit.     Baseline  erbal cues    Time  3    Period  Days    Status  Achieved      OT LONG TERM GOAL #2   Title  Pt will be able todon and doff existing compression garments independently and without assistive devices and/ or extra time for optimal lymphedema self management over time and to limit infection risk.    Baseline  supervision    Time  4    Period  Days    Status  Achieved      OT LONG TERM GOAL #3   Title  Pt to achieve at least  10% limb volume reduction in affected limb(s)  bilaterally during Intensive Phase CDT to control limb swelling, to improve tissue integrity and immune function, to improve ADLs performance and to improve functional mobility/ transfer, and to improve body image and self-esteem.    Baseline  max A    Time  8    Period  Weeks     Status  On-going   decreased 1.05% on 02/15/20     OT LONG TERM GOAL #4   Title  Pt will achieve 100% compliance with daily LE self-care home program components, including daily  skin care, simple self-MLD, gradient compression wraps/ compression garments/devices, and therapeutic exercise with Mod CG support to ensure optimal Intensive Phase limb volume reduction to expedite compression garment/ device fitting.  Baseline  50% compliant    Time  12    Period  Weeks    Status  On-going      OT LONG TERM GOAL #5   Title  Pt will use appropriate  assistive devices during LE self-care training to don compression garments/devices with modified independence to ensure optimal LE self-management over time and to limit progression of chronic LE.    Baseline  min A    Time  12    Period  Weeks    Status  Deferred   DC goal     OT LONG TERM GOAL #6   Title  Pt will retain optimal limb volume reductions achieved during Intensive Phase CDT with no more than 3% volume increase with ongoing CG assistance to limit LE progression and further functional decline.    Baseline  min A    Time  6    Period  Months    Status  On-going            Plan - 02/15/20 1616    Clinical Impression Statement  Provided MLD and skin  bilaterally w/ emphasis on LLE. After fibrosis techniques L ankle AROM is increased to WNL. Completed LLE comparative limb volumetrics. LLE is decreased by 1.05% since last measured on 12/22/19. Limb volume differential is decreased from 4.5% to 1.05%, which is WNL. Pain in legs, induration in distal legs and ankles with redness typical of venous disease remains stubbornly persistent, but edema is minimal when Pt is diligent with compression and seated elevation as directed. Pt recently ordered a pair of off the shelf Juzo, circular knit, ccl 2 knee high compression stockings as recommended as her existing compression stockings offer inadequate containment and compression to match demand.  Pt's sensory symptoms in her legs are unchanged since commencing OT for CDT. She does report analgesic effect from MLD for several hours. Cont 1 x weekly as per POC.       Patient will benefit from skilled therapeutic intervention in order to improve the following deficits and impairments:           Visit Diagnosis: Lymphedema, not elsewhere classified    Problem List Patient Active Problem List   Diagnosis Date Noted  . Hyperlipidemia 02/14/2020  . Obesity 02/14/2020  . Vitamin D deficiency 06/28/2019  . Prediabetes 06/28/2019  . GERD (gastroesophageal reflux disease) 12/07/2018  . Vitamin B12 deficiency 12/05/2018  . Senile purpura ([place]) 07/28/2018  . Chronic venous insufficiency 07/14/2018  . Lymphedema 07/14/2018  . Aneurysm of descending thoracic aorta (HCC) 05/04/2018  . Allergic rhinitis 02/16/2018  . Aortic atherosclerosis ([place]) 01/13/2018  . Status post placement of implantable loop recorder 01/12/2018  . Chronic kidney disease, stage 3 01/12/2018  . History of CVA (cerebrovascular accident)   . TIA (transient ischemic attack) 01/04/2018  . CAD (coronary artery disease) 11/25/2017  . Caregiver stress 11/25/2017  . Menopause 08/14/2017  . De Quervain's tenosynovitis, right 07/17/2017  . Stress incontinence 06/19/2017  . Personal history of tobacco use, presenting hazards to health 04/01/2017  . Anxiety 03/13/2017  . Advanced care planning/counseling discussion 10/17/2016  . Benign neoplasm of sigmoid colon   . Centrilobular emphysema ([place]) 10/17/2015  . Mass of upper inner quadrant of right breast 06/22/2015    Andrey Spearman, MS, OTR/L, [facility] 02/15/20 4:25 PM  [place] MAIN [facility] SERVICES [address] [place], [name], [address] Phone: [phone]   Fax:  [phone]  Name: Sharon [name]  MRN: [id number] Date of Birth: 06/18/1946

## 2020-02-20 ENCOUNTER — Ambulatory Visit: Payer: Medicare Other | Admitting: Occupational Therapy

## 2020-02-20 ENCOUNTER — Other Ambulatory Visit: Payer: Self-pay

## 2020-02-20 DIAGNOSIS — I890 Lymphedema, not elsewhere classified: Secondary | ICD-10-CM | POA: Diagnosis not present

## 2020-02-20 NOTE — Therapy (Signed)
[place] MAIN [facility] SERVICES [address] [place], [name], [address] Phone: [phone]   Fax:  [phone]  Occupational Therapy Treatment  Patient Details  Name: Sharon [name] MRN: [id number] Date of Birth: Jun 06, 1946 Referring Provider (OT): Eulogio Ditch, NP   Encounter Date: 02/20/2020  OT End of Session - 02/20/20 1507    Visit Number  21    Number of Visits  36    Date for OT Re-Evaluation  02/12/20    OT Start Time  0115    OT Stop Time  0303    OT Time Calculation (min)  108 min    Activity Tolerance  Patient tolerated treatment well;No increased pain    Behavior During Therapy  WFL for tasks assessed/performed       Past Medical History:  Diagnosis Date  . Anxiety   . Arthritis    hands, upper back  . Asthma   . COPD (chronic obstructive pulmonary disease) ([place])   . History of cervical cancer   . Menopausal disorder   . Osteoporosis   . Pneumonia 1960  . Spasm of abdominal muscles of right side    intermittent  . TMJ (dislocation of temporomandibular joint)   . Wears dentures    partial lower    Past Surgical History:  Procedure Laterality Date  . ABDOMINAL HYSTERECTOMY  1970's  . bladder botox  2005  . BLADDER SUSPENSION  2004  . CATARACT EXTRACTION W/PHACO Right 03/09/2018   Procedure: CATARACT EXTRACTION PHACO AND INTRAOCULAR LENS PLACEMENT ([place]) right;  Surgeon: Eulogio Bear, MD;  Location: [place];  Service: Ophthalmology;  Laterality: Right;  CALL CELL 1ST  . CATARACT EXTRACTION W/PHACO Left 04/19/2018   Procedure: CATARACT EXTRACTION PHACO AND INTRAOCULAR LENS PLACEMENT (IOC)  LEFT;  Surgeon: Eulogio Bear, MD;  Location: [place];  Service: Ophthalmology;  Laterality: Left;  . COLONOSCOPY WITH PROPOFOL N/A 12/13/2015   Procedure: COLONOSCOPY WITH PROPOFOL;  Surgeon: Lucilla Lame, MD;  Location: [place];  Service: Endoscopy;  Laterality: N/A;  . LOOP RECORDER  INSERTION N/A 01/07/2018   Procedure: LOOP RECORDER INSERTION;  Surgeon: Deboraha Sprang, MD;  Location: [place] [facility];  Service: Cardiovascular;  Laterality: N/A;  . POLYPECTOMY N/A 12/13/2015   Procedure: POLYPECTOMY;  Surgeon: Lucilla Lame, MD;  Location: [place];  Service: Endoscopy;  Laterality: N/A;  SIGMOID COLON POLYPS X  5  . SHOULDER ARTHROSCOPY W/ ROTATOR CUFF REPAIR Right 1998  . TEE WITHOUT CARDIOVERSION N/A 01/06/2018   Procedure: TRANSESOPHAGEAL ECHOCARDIOGRAM (TEE);  Surgeon: Minna Merritts, MD;  Location: [facility];  Service: Cardiovascular;  Laterality: N/A;  . TONSILLECTOMY AND ADENOIDECTOMY      There were no vitals filed for this visit.  Subjective Assessment - 02/20/20 1422    Subjective   Mrs Gela Rooth for OT visit 21 of 36 to address BLE Lymphedema. Pt reports  avaerahe leg pain is 5-6 /10. Pt reports she has not received the recommended stockings yet, so she wore existing stocks during visit interval.    Pertinent History  Hx anxiety, COPD, OA, Asthma, hx cervical ca, osteoporosis, hx pneumonia, hx CVA, hx LLE cellulitis, hx L leg wound, hx falls    Limitations  chronic BLE leg swelling and associated pain, decreased balance, fall risk, decreased standing, walking and sitting tolerance, difficulty w/ lower body bathing and dressing, difficulty fitting LB clothing and shoes, unable to drive, impaired transfers and functional mobility, impaired  social participation, impaired ability to perform household chores, cooking, meal prep    Pain Onset  More than a month ago                   OT Treatments/Exercises (OP) - 02/20/20 0001      ADLs   ADL Education Given  Yes      Manual Therapy   Manual Therapy  Edema management    Manual therapy comments  skin care w/ low ph castor oil throughout MLD to increase skin hydration and tissue mobuility/flexibility.    Edema Management  LLE comparative limb volumetrics    Soft tissue mobilization   fibrosis   techniques to LLE during MLD    Manual Lymphatic Drainage (MLD)  MLD to LLE as established    Compression Bandaging  Pt dons         compression socks independently after session.             OT Education - 02/20/20 1507    Education Details  Continued skilled Pt/caregiver education  And LE ADL training throughout visit for lymphedema self care/ home program, including compression wrapping, compression garment and device wear/care, lymphatic pumping ther ex, simple self-MLD, and skin care. Discussed progress towards goals.    Person(s) Educated  Patient    Methods  Explanation;Demonstration    Comprehension  Verbalized understanding;Returned demonstration          OT Long Term Goals - 02/15/20 1623      OT LONG TERM GOAL #1   Title  Pt will demonstrate understanding of lymphedema (LE) precautions / prevention principals, including signs / symptoms of cellulitis infection with modified independence using LE Workbook as printed reference to identify 6 precautions without verbal cues by end of 3rd  OT Rx visit.     Baseline  erbal cues    Time  3    Period  Days    Status  Achieved      OT LONG TERM GOAL #2   Title  Pt will be able todon and doff existing compression garments independently and without assistive devices and/ or extra time for optimal lymphedema self management over time and to limit infection risk.    Baseline  supervision    Time  4    Period  Days    Status  Achieved      OT LONG TERM GOAL #3   Title  Pt to achieve at least  10% limb volume reduction in affected limb(s)  bilaterally during Intensive Phase CDT to control limb swelling, to improve tissue integrity and immune function, to improve ADLs performance and to improve functional mobility/ transfer, and to improve body image and self-esteem.    Baseline  max A    Time  8    Period  Weeks    Status  On-going   decreased 1.05% on 02/15/20     OT LONG TERM GOAL #4   Title  Pt will achieve  100% compliance with daily LE self-care home program components, including daily  skin care, simple self-MLD, gradient compression wraps/ compression garments/devices, and therapeutic exercise with Mod CG support to ensure optimal Intensive Phase limb volume reduction to expedite compression garment/ device fitting.    Baseline  50% compliant    Time  12    Period  Weeks    Status  On-going      OT LONG TERM GOAL #5   Title  Pt will use appropriate  assistive devices during LE self-care training to don compression garments/devices with modified independence to ensure optimal LE self-management over time and to limit progression of chronic LE.    Baseline  min A    Time  12    Period  Weeks    Status  Deferred   DC goal     OT LONG TERM GOAL #6   Title  Pt will retain optimal limb volume reductions achieved during Intensive Phase CDT with no more than 3% volume increase with ongoing CG assistance to limit LE progression and further functional decline.    Baseline  min A    Time  6    Period  Months    Status  On-going            Plan - 02/20/20 1509    Clinical Impression Statement  Pt tolerated MLD with deep bliateral fibrosis techniques. Pt reported decreased pain ad tigtness pain when standing after manual therapy . Cont as per POC.       Patient will benefit from skilled therapeutic intervention in order to improve the following deficits and impairments:           Visit Diagnosis: Lymphedema, not elsewhere classified    Problem List Patient Active Problem List   Diagnosis Date Noted  . Hyperlipidemia 02/14/2020  . Obesity 02/14/2020  . Vitamin D deficiency 06/28/2019  . Prediabetes 06/28/2019  . GERD (gastroesophageal reflux disease) 12/07/2018  . Vitamin B12 deficiency 12/05/2018  . Senile purpura ([place]) 07/28/2018  . Chronic venous insufficiency 07/14/2018  . Lymphedema 07/14/2018  . Aneurysm of descending thoracic aorta (HCC) 05/04/2018  . Allergic  rhinitis 02/16/2018  . Aortic atherosclerosis ([place]) 01/13/2018  . Status post placement of implantable loop recorder 01/12/2018  . Chronic kidney disease, stage 3 01/12/2018  . History of CVA (cerebrovascular accident)   . TIA (transient ischemic attack) 01/04/2018  . CAD (coronary artery disease) 11/25/2017  . Caregiver stress 11/25/2017  . Menopause 08/14/2017  . De Quervain's tenosynovitis, right 07/17/2017  . Stress incontinence 06/19/2017  . Personal history of tobacco use, presenting hazards to health 04/01/2017  . Anxiety 03/13/2017  . Advanced care planning/counseling discussion 10/17/2016  . Benign neoplasm of sigmoid colon   . Centrilobular emphysema ([place]) 10/17/2015  . Mass of upper inner quadrant of right breast 06/22/2015    Andrey Spearman, MS, OTR/L, [facility] 02/20/20 3:10 PM  [place] MAIN [facility] SERVICES [address] [place], [name], [address] Phone: [phone]   Fax:  [phone]  Name: HENLY KUECHENMEISTER MRN: [id number] Date of Birth: 06-29-1946

## 2020-02-22 ENCOUNTER — Other Ambulatory Visit: Payer: Self-pay

## 2020-02-22 ENCOUNTER — Ambulatory Visit: Payer: Medicare Other | Admitting: Occupational Therapy

## 2020-02-22 ENCOUNTER — Telehealth: Payer: Self-pay | Admitting: Cardiovascular Disease

## 2020-02-22 DIAGNOSIS — I890 Lymphedema, not elsewhere classified: Secondary | ICD-10-CM | POA: Diagnosis not present

## 2020-02-22 NOTE — Therapy (Signed)
[place] MAIN [facility] SERVICES [address] [place], [name], [address] Phone: [phone]   Fax:  [phone]  Occupational Therapy Treatment  Patient Details  Name: Sharon [name] MRN: [id number] Date of Birth: 01/01/1946 Referring Provider (OT): Eulogio Ditch, NP   Encounter Date: 02/22/2020  OT End of Session - 02/22/20 1612    Visit Number  22    Number of Visits  36    Date for OT Re-Evaluation  02/12/20    OT Start Time  0305    OT Stop Time  0405    OT Time Calculation (min)  60 min    Activity Tolerance  Patient tolerated treatment well;No increased pain    Behavior During Therapy  WFL for tasks assessed/performed       Past Medical History:  Diagnosis Date  . Anxiety   . Arthritis    hands, upper back  . Asthma   . COPD (chronic obstructive pulmonary disease) ([place])   . History of cervical cancer   . Menopausal disorder   . Osteoporosis   . Pneumonia 1960  . Spasm of abdominal muscles of right side    intermittent  . TMJ (dislocation of temporomandibular joint)   . Wears dentures    partial lower    Past Surgical History:  Procedure Laterality Date  . ABDOMINAL HYSTERECTOMY  1970's  . bladder botox  2005  . BLADDER SUSPENSION  2004  . CATARACT EXTRACTION W/PHACO Right 03/09/2018   Procedure: CATARACT EXTRACTION PHACO AND INTRAOCULAR LENS PLACEMENT ([place]) right;  Surgeon: Eulogio Bear, MD;  Location: [place];  Service: Ophthalmology;  Laterality: Right;  CALL CELL 1ST  . CATARACT EXTRACTION W/PHACO Left 04/19/2018   Procedure: CATARACT EXTRACTION PHACO AND INTRAOCULAR LENS PLACEMENT (IOC)  LEFT;  Surgeon: Eulogio Bear, MD;  Location: [place];  Service: Ophthalmology;  Laterality: Left;  . COLONOSCOPY WITH PROPOFOL N/A 12/13/2015   Procedure: COLONOSCOPY WITH PROPOFOL;  Surgeon: Lucilla Lame, MD;  Location: [place];  Service: Endoscopy;  Laterality: N/A;  . LOOP RECORDER  INSERTION N/A 01/07/2018   Procedure: LOOP RECORDER INSERTION;  Surgeon: Deboraha Sprang, MD;  Location: [place] [facility];  Service: Cardiovascular;  Laterality: N/A;  . POLYPECTOMY N/A 12/13/2015   Procedure: POLYPECTOMY;  Surgeon: Lucilla Lame, MD;  Location: [place];  Service: Endoscopy;  Laterality: N/A;  SIGMOID COLON POLYPS X  5  . SHOULDER ARTHROSCOPY W/ ROTATOR CUFF REPAIR Right 1998  . TEE WITHOUT CARDIOVERSION N/A 01/06/2018   Procedure: TRANSESOPHAGEAL ECHOCARDIOGRAM (TEE);  Surgeon: Minna Merritts, MD;  Location: [facility];  Service: Cardiovascular;  Laterality: N/A;  . TONSILLECTOMY AND ADENOIDECTOMY      There were no vitals filed for this visit.  Subjective Assessment - 02/22/20 1607    Subjective   Sharon [name] for OT visit 22 of 36 to address BLE Lymphedema. Pt reports recommended compression knee highs have still not arrived. Pt does not rate  leg pain today, but c/o pain behind L knee and ankle, and at R distal leg and ankle.    Pertinent History  Hx anxiety, COPD, OA, Asthma, hx cervical ca, osteoporosis, hx pneumonia, hx CVA, hx LLE cellulitis, hx L leg wound, hx falls    Limitations  chronic BLE leg swelling and associated pain, decreased balance, fall risk, decreased standing, walking and sitting tolerance, difficulty w/ lower body bathing and dressing, difficulty fitting LB clothing and shoes, unable to drive,  impaired transfers and functional mobility, impaired social participation, impaired ability to perform household chores, cooking, meal prep    Pain Onset  More than a month ago                   OT Treatments/Exercises (OP) - 02/22/20 0001      ADLs   ADL Education Given  Yes      Manual Therapy   Manual Therapy  Edema management    Manual therapy comments  skin care w/ low ph castor oil throughout MLD to increase skin hydration and tissue mobuility/flexibility.    Soft tissue mobilization  fibrosis techniques to BLE  during  MLD at R medial and posterior -medial thigh superir to reported baker cyst, at R distal leg and ankle circumferentially. Fibrosis techniques to L distal leg and ankle circumferentially. Tissue softening and increased flexibility noted after manual Rx.    Manual Lymphatic Drainage (MLD)  MLD to BLE as established    Compression Bandaging  Pt does not don compression stockings after session.             OT Education - 02/22/20 1612    Education Details  Continued skilled Pt/caregiver education  And LE ADL training throughout visit for lymphedema self care/ home program, including compression wrapping, compression garment and device wear/care, lymphatic pumping ther ex, simple self-MLD, and skin care. Discussed progress towards goals.    Person(s) Educated  Patient    Methods  Explanation;Demonstration    Comprehension  Verbalized understanding;Returned demonstration          OT Long Term Goals - 02/15/20 1623      OT LONG TERM GOAL #1   Title  Pt will demonstrate understanding of lymphedema (LE) precautions / prevention principals, including signs / symptoms of cellulitis infection with modified independence using LE Workbook as printed reference to identify 6 precautions without verbal cues by end of 3rd  OT Rx visit.     Baseline  erbal cues    Time  3    Period  Days    Status  Achieved      OT LONG TERM GOAL #2   Title  Pt will be able todon and doff existing compression garments independently and without assistive devices and/ or extra time for optimal lymphedema self management over time and to limit infection risk.    Baseline  supervision    Time  4    Period  Days    Status  Achieved      OT LONG TERM GOAL #3   Title  Pt to achieve at least  10% limb volume reduction in affected limb(s)  bilaterally during Intensive Phase CDT to control limb swelling, to improve tissue integrity and immune function, to improve ADLs performance and to improve functional mobility/  transfer, and to improve body image and self-esteem.    Baseline  max A    Time  8    Period  Weeks    Status  On-going   decreased 1.05% on 02/15/20     OT LONG TERM GOAL #4   Title  Pt will achieve 100% compliance with daily LE self-care home program components, including daily  skin care, simple self-MLD, gradient compression wraps/ compression garments/devices, and therapeutic exercise with Mod CG support to ensure optimal Intensive Phase limb volume reduction to expedite compression garment/ device fitting.    Baseline  50% compliant    Time  12    Period  Weeks  Status  On-going      OT LONG TERM GOAL #5   Title  Pt will use appropriate  assistive devices during LE self-care training to don compression garments/devices with modified independence to ensure optimal LE self-management over time and to limit progression of chronic LE.    Baseline  min A    Time  12    Period  Weeks    Status  Deferred   DC goal     OT LONG TERM GOAL #6   Title  Pt will retain optimal limb volume reductions achieved during Intensive Phase CDT with no more than 3% volume increase with ongoing CG assistance to limit LE progression and further functional decline.    Baseline  min A    Time  6    Period  Months    Status  On-going            Plan - 02/22/20 1615    Clinical Impression Statement  Provided MLD and skin  care bilaterally. After full leg sequences provided deep fibrosis techniques with slight softening and increased joiint flexibility at ankles noted at end of session. Pt reports decreased discomfort after manual therapy. Cont as per POC.       Patient will benefit from skilled therapeutic intervention in order to improve the following deficits and impairments:           Visit Diagnosis: Lymphedema, not elsewhere classified    Problem List Patient Active Problem List   Diagnosis Date Noted  . Hyperlipidemia 02/14/2020  . Obesity 02/14/2020  . Vitamin D deficiency  06/28/2019  . Prediabetes 06/28/2019  . GERD (gastroesophageal reflux disease) 12/07/2018  . Vitamin B12 deficiency 12/05/2018  . Senile purpura ([place]) 07/28/2018  . Chronic venous insufficiency 07/14/2018  . Lymphedema 07/14/2018  . Aneurysm of descending thoracic aorta (HCC) 05/04/2018  . Allergic rhinitis 02/16/2018  . Aortic atherosclerosis ([place]) 01/13/2018  . Status post placement of implantable loop recorder 01/12/2018  . Chronic kidney disease, stage 3 01/12/2018  . History of CVA (cerebrovascular accident)   . TIA (transient ischemic attack) 01/04/2018  . CAD (coronary artery disease) 11/25/2017  . Caregiver stress 11/25/2017  . Menopause 08/14/2017  . De Quervain's tenosynovitis, right 07/17/2017  . Stress incontinence 06/19/2017  . Personal history of tobacco use, presenting hazards to health 04/01/2017  . Anxiety 03/13/2017  . Advanced care planning/counseling discussion 10/17/2016  . Benign neoplasm of sigmoid colon   . Centrilobular emphysema ([place]) 10/17/2015  . Mass of upper inner quadrant of right breast 06/22/2015    Andrey Spearman, MS, OTR/L, [facility] 02/22/20 4:18 PM  [place] MAIN [facility] SERVICES [address] [place], [name], [address] Phone: [phone]   Fax:  [phone]  Name: Sharon [name] MRN: [id number] Date of Birth: 14-Apr-1946

## 2020-02-22 NOTE — Telephone Encounter (Signed)
Spoke with patient about referral for cardiac rehab and she wants to wait at this time. She has lymphedema treatments twice a week, husband has dialysis 3 days a week, and then appointments for each of them which is just too much. She stated she would not forget about it and would be in touch when she is ready to start a regular program. She was appreciative for the follow up with no further questions at this time.

## 2020-02-22 NOTE — Telephone Encounter (Signed)
Left voicemail message for patient to call back.

## 2020-02-27 ENCOUNTER — Ambulatory Visit: Payer: Medicare Other | Admitting: Occupational Therapy

## 2020-02-27 ENCOUNTER — Other Ambulatory Visit: Payer: Self-pay

## 2020-02-27 DIAGNOSIS — I890 Lymphedema, not elsewhere classified: Secondary | ICD-10-CM

## 2020-02-27 NOTE — Therapy (Signed)
[place] MAIN [facility] SERVICES [address] [place], [name], [address] Phone: [phone]   Fax:  [phone]  Patient Details  Name: ELEDA ROCKWELL MRN: [id number] Date of Birth: 10-21-1945 Referring Provider:  [name], NP  Encounter Date: 02/27/2020   Ansel Bong 02/27/2020, 4:45 PM  [place] MAIN [facility] SERVICES [address] [place], [name], [address] Phone: [phone]   Fax:  [phone]

## 2020-02-29 ENCOUNTER — Ambulatory Visit: Payer: Medicare Other | Admitting: Occupational Therapy

## 2020-02-29 ENCOUNTER — Other Ambulatory Visit: Payer: Self-pay

## 2020-02-29 DIAGNOSIS — I890 Lymphedema, not elsewhere classified: Secondary | ICD-10-CM | POA: Diagnosis not present

## 2020-02-29 LAB — CUP PACEART REMOTE DEVICE CHECK
Date Time Interrogation Session: 20210519015308
Implantable Pulse Generator Implant Date: 20190328

## 2020-02-29 NOTE — Therapy (Signed)
[place] MAIN [facility] SERVICES [address] [place], [name], [address] Phone: [phone]   Fax:  [phone]  Occupational Therapy Treatment  Patient Details  Name: Sharon [name] MRN: [id number] Date of Birth: June 28, 1946 Referring Provider (OT): Eulogio Ditch, NP   Encounter Date: 02/29/2020  OT End of Session - 02/29/20 1622    Visit Number  25    Number of Visits  36    Date for OT Re-Evaluation  02/12/20    OT Start Time  0310    OT Stop Time  0410    OT Time Calculation (min)  60 min    Activity Tolerance  Patient tolerated treatment well;No increased pain    Behavior During Therapy  WFL for tasks assessed/performed       Past Medical History:  Diagnosis Date  . Anxiety   . Arthritis    hands, upper back  . Asthma   . COPD (chronic obstructive pulmonary disease) ([place])   . History of cervical cancer   . Menopausal disorder   . Osteoporosis   . Pneumonia 1960  . Spasm of abdominal muscles of right side    intermittent  . TMJ (dislocation of temporomandibular joint)   . Wears dentures    partial lower    Past Surgical History:  Procedure Laterality Date  . ABDOMINAL HYSTERECTOMY  1970's  . bladder botox  2005  . BLADDER SUSPENSION  2004  . CATARACT EXTRACTION W/PHACO Right 03/09/2018   Procedure: CATARACT EXTRACTION PHACO AND INTRAOCULAR LENS PLACEMENT ([place]) right;  Surgeon: Eulogio Bear, MD;  Location: [place];  Service: Ophthalmology;  Laterality: Right;  CALL CELL 1ST  . CATARACT EXTRACTION W/PHACO Left 04/19/2018   Procedure: CATARACT EXTRACTION PHACO AND INTRAOCULAR LENS PLACEMENT (I[facility])  LEFT;  Surgeon: Eulogio Bear, MD;  Location: [place];  Service: Ophthalmology;  Laterality: Left;  . COLONOSCOPY WITH PROPOFOL N/A 12/13/2015   Procedure: COLONOSCOPY WITH PROPOFOL;  Surgeon: Lucilla Lame, MD;  Location: [place];  Service: Endoscopy;  Laterality: N/A;  . LOOP RECORDER  INSERTION N/A 01/07/2018   Procedure: LOOP RECORDER INSERTION;  Surgeon: Deboraha Sprang, MD;  Location: [place] [facility];  Service: Cardiovascular;  Laterality: N/A;  . POLYPECTOMY N/A 12/13/2015   Procedure: POLYPECTOMY;  Surgeon: Lucilla Lame, MD;  Location: [place];  Service: Endoscopy;  Laterality: N/A;  SIGMOID COLON POLYPS X  5  . SHOULDER ARTHROSCOPY W/ ROTATOR CUFF REPAIR Right 1998  . TEE WITHOUT CARDIOVERSION N/A 01/06/2018   Procedure: TRANSESOPHAGEAL ECHOCARDIOGRAM (TEE);  Surgeon: Minna Merritts, MD;  Location: [facility];  Service: Cardiovascular;  Laterality: N/A;  . TONSILLECTOMY AND ADENOIDECTOMY      There were no vitals filed for this visit.  Subjective Assessment - 02/29/20 1619    Subjective   Sharon [name] for OT visit 25 of 36 to address BLE Lymphedema. Pt presents with new  closed toe Juzo knee highs in place. These stockings are not bunching at the ankles and Pt reports they are very comfortable. Pt encouraged to obtain garment adhesive we discussed last time for use with the open toed stockings she purchased to eliminate the sliding at foot edge.    Pertinent History  Hx anxiety, COPD, OA, Asthma, hx cervical ca, osteoporosis, hx pneumonia, hx CVA, hx LLE cellulitis, hx L leg wound, hx falls    Limitations  chronic BLE leg swelling and associated pain, decreased balance, fall risk, decreased standing,  walking and sitting tolerance, difficulty w/ lower body bathing and dressing, difficulty fitting LB clothing and shoes, unable to drive, impaired transfers and functional mobility, impaired social participation, impaired ability to perform household chores, cooking, meal prep    Pain Onset  More than a month ago                   OT Treatments/Exercises (OP) - 02/29/20 0001      ADLs   ADL Education Given  Yes      Manual Therapy   Manual Therapy  Edema management;Soft tissue mobilization;Manual Lymphatic Drainage (MLD);Compression  Bandaging    Manual therapy comments  skin care w/ low ph castor oil throughout MLD to increase skin hydration and tissue mobuility/flexibility.    Manual Lymphatic Drainage (MLD)  MLD to LLE as established. Added fibrosis technique and myofacial release at ankle and distal leg to increase tissue flexibility    Compression Bandaging  Pt  dons compression stocking after session.             OT Education - 02/29/20 1622    Education Details  Continued skilled Pt/caregiver education  And LE ADL training throughout visit for lymphedema self care/ home program, including compression wrapping, compression garment and device wear/care, lymphatic pumping ther ex, simple self-MLD, and skin care. Discussed progress towards goals.    Person(s) Educated  Patient    Methods  Explanation;Demonstration    Comprehension  Verbalized understanding;Returned demonstration          OT Long Term Goals - 02/15/20 1623      OT LONG TERM GOAL #1   Title  Pt will demonstrate understanding of lymphedema (LE) precautions / prevention principals, including signs / symptoms of cellulitis infection with modified independence using LE Workbook as printed reference to identify 6 precautions without verbal cues by end of 3rd  OT Rx visit.     Baseline  erbal cues    Time  3    Period  Days    Status  Achieved      OT LONG TERM GOAL #2   Title  Pt will be able todon and doff existing compression garments independently and without assistive devices and/ or extra time for optimal lymphedema self management over time and to limit infection risk.    Baseline  supervision    Time  4    Period  Days    Status  Achieved      OT LONG TERM GOAL #3   Title  Pt to achieve at least  10% limb volume reduction in affected limb(s)  bilaterally during Intensive Phase CDT to control limb swelling, to improve tissue integrity and immune function, to improve ADLs performance and to improve functional mobility/ transfer, and to  improve body image and self-esteem.    Baseline  max A    Time  8    Period  Weeks    Status  On-going   decreased 1.05% on 02/15/20     OT LONG TERM GOAL #4   Title  Pt will achieve 100% compliance with daily LE self-care home program components, including daily  skin care, simple self-MLD, gradient compression wraps/ compression garments/devices, and therapeutic exercise with Mod CG support to ensure optimal Intensive Phase limb volume reduction to expedite compression garment/ device fitting.    Baseline  50% compliant    Time  12    Period  Weeks    Status  On-going  OT LONG TERM GOAL #5   Title  Pt will use appropriate  assistive devices during LE self-care training to don compression garments/devices with modified independence to ensure optimal LE self-management over time and to limit progression of chronic LE.    Baseline  min A    Time  12    Period  Weeks    Status  Deferred   DC goal     OT LONG TERM GOAL #6   Title  Pt will retain optimal limb volume reductions achieved during Intensive Phase CDT with no more than 3% volume increase with ongoing CG assistance to limit LE progression and further functional decline.    Baseline  min A    Time  6    Period  Months    Status  On-going            Plan - 02/29/20 1623    Clinical Impression Statement  Pt tolerated MLD to LLE with deep bliateral fibrosis techniques and myofacial techniques to increase tissue flexibility and reduce resulting AROM limitations at the ankle. Pt reported decreased pain and tigtness pain at end of session. Pt and OT discussed plan for progress review next session including limb volumetrics. We discussed plan to decrease treatment frequency from 2x to 1 x weekly as Pt incorporates daily use of sequential pnematic device into daily self care.       Patient will benefit from skilled therapeutic intervention in order to improve the following deficits and impairments:           Visit  Diagnosis: Lymphedema, not elsewhere classified    Problem List Patient Active Problem List   Diagnosis Date Noted  . Hyperlipidemia 02/14/2020  . Obesity 02/14/2020  . Vitamin D deficiency 06/28/2019  . Prediabetes 06/28/2019  . GERD (gastroesophageal reflux disease) 12/07/2018  . Vitamin B12 deficiency 12/05/2018  . Senile purpura ([place]) 07/28/2018  . Chronic venous insufficiency 07/14/2018  . Lymphedema 07/14/2018  . Aneurysm of descending thoracic aorta (HCC) 05/04/2018  . Allergic rhinitis 02/16/2018  . Aortic atherosclerosis ([place]) 01/13/2018  . Status post placement of implantable loop recorder 01/12/2018  . Chronic kidney disease, stage 3 01/12/2018  . History of CVA (cerebrovascular accident)   . TIA (transient ischemic attack) 01/04/2018  . CAD (coronary artery disease) 11/25/2017  . Caregiver stress 11/25/2017  . Menopause 08/14/2017  . De Quervain's tenosynovitis, right 07/17/2017  . Stress incontinence 06/19/2017  . Personal history of tobacco use, presenting hazards to health 04/01/2017  . Anxiety 03/13/2017  . Advanced care planning/counseling discussion 10/17/2016  . Benign neoplasm of sigmoid colon   . Centrilobular emphysema ([place]) 10/17/2015  . Mass of upper inner quadrant of right breast 06/22/2015    Andrey Spearman, MS, OTR/L, [facility] 02/29/20 4:27 PM   [place] MAIN [facility] SERVICES [address] [place], [name], [address] Phone: [phone]   Fax:  [phone]  Name: Sharon [name] MRN: [id number] Date of Birth: 03/13/1946

## 2020-03-05 ENCOUNTER — Ambulatory Visit (INDEPENDENT_AMBULATORY_CARE_PROVIDER_SITE_OTHER): Payer: Medicare Other | Admitting: *Deleted

## 2020-03-05 ENCOUNTER — Encounter: Payer: Medicare Other | Admitting: Occupational Therapy

## 2020-03-05 DIAGNOSIS — I6310 Cerebral infarction due to embolism of unspecified precerebral artery: Secondary | ICD-10-CM

## 2020-03-06 NOTE — Progress Notes (Signed)
Carelink Summary Report / Loop Recorder 

## 2020-03-07 ENCOUNTER — Other Ambulatory Visit: Payer: Self-pay

## 2020-03-07 ENCOUNTER — Ambulatory Visit: Payer: Medicare Other | Admitting: Occupational Therapy

## 2020-03-07 DIAGNOSIS — I890 Lymphedema, not elsewhere classified: Secondary | ICD-10-CM

## 2020-03-07 NOTE — Therapy (Signed)
[place] MAIN [facility] SERVICES [address] [place], [name], [address] Phone: [phone]   Fax:  [phone]  Occupational Therapy Treatment  Patient Details  Name: Sharon [name] MRN: [id number] Date of Birth: [birth year] Referring Provider (OT): Eulogio Ditch, NP   Encounter Date: 03/07/2020  OT End of Session - 03/07/20 1512    Visit Number  26    Number of Visits  36    Date for OT Re-Evaluation  02/12/20    OT Start Time  0205    OT Stop Time  0305    OT Time Calculation (min)  60 min    Activity Tolerance  Patient tolerated treatment well;No increased pain    Behavior During Therapy  WFL for tasks assessed/performed       Past Medical History:  Diagnosis Date  . Anxiety   . Arthritis    hands, upper back  . Asthma   . COPD (chronic obstructive pulmonary disease) ([place])   . History of cervical cancer   . Menopausal disorder   . Osteoporosis   . Pneumonia 1960  . Spasm of abdominal muscles of right side    intermittent  . TMJ (dislocation of temporomandibular joint)   . Wears dentures    partial lower    Past Surgical History:  Procedure Laterality Date  . ABDOMINAL HYSTERECTOMY  1970's  . bladder botox  2005  . BLADDER SUSPENSION  2004  . CATARACT EXTRACTION W/PHACO Right 03/09/2018   Procedure: CATARACT EXTRACTION PHACO AND INTRAOCULAR LENS PLACEMENT ([place]) right;  Surgeon: Eulogio Bear, MD;  Location: [place];  Service: Ophthalmology;  Laterality: Right;  CALL CELL 1ST  . CATARACT EXTRACTION W/PHACO Left 04/19/2018   Procedure: CATARACT EXTRACTION PHACO AND INTRAOCULAR LENS PLACEMENT (IOC)  LEFT;  Surgeon: Eulogio Bear, MD;  Location: [place];  Service: Ophthalmology;  Laterality: Left;  . COLONOSCOPY WITH PROPOFOL N/A 12/13/2015   Procedure: COLONOSCOPY WITH PROPOFOL;  Surgeon: Lucilla Lame, MD;  Location: [place];  Service: Endoscopy;  Laterality: N/A;  . LOOP RECORDER  INSERTION N/A 01/07/2018   Procedure: LOOP RECORDER INSERTION;  Surgeon: Deboraha Sprang, MD;  Location: [place] [facility];  Service: Cardiovascular;  Laterality: N/A;  . POLYPECTOMY N/A 12/13/2015   Procedure: POLYPECTOMY;  Surgeon: Lucilla Lame, MD;  Location: [place];  Service: Endoscopy;  Laterality: N/A;  SIGMOID COLON POLYPS X  5  . SHOULDER ARTHROSCOPY W/ ROTATOR CUFF REPAIR Right 1998  . TEE WITHOUT CARDIOVERSION N/A 01/06/2018   Procedure: TRANSESOPHAGEAL ECHOCARDIOGRAM (TEE);  Surgeon: Minna Merritts, MD;  Location: [facility];  Service: Cardiovascular;  Laterality: N/A;  . TONSILLECTOMY AND ADENOIDECTOMY      There were no vitals filed for this visit.  Subjective Assessment - 03/07/20 1506    Subjective   Mrs Gemma Leggio for OT visit 26 of 36 to address BLE Lymphedema. Pt has no new concerns. Pt reports tightness in legs, but does not rate pain numerically today.    Pertinent History  Hx anxiety, COPD, OA, Asthma, hx cervical ca, osteoporosis, hx pneumonia, hx CVA, hx LLE cellulitis, hx L leg wound, hx falls    Limitations  chronic BLE leg swelling and associated pain, decreased balance, fall risk, decreased standing, walking and sitting tolerance, difficulty w/ lower body bathing and dressing, difficulty fitting LB clothing and shoes, unable to drive, impaired transfers and functional mobility, impaired social participation, impaired ability to perform household chores, cooking, meal  prep    Pain Onset  More than a month ago                   OT Treatments/Exercises (OP) - 03/07/20 0001      ADLs   ADL Education Given  Yes      Manual Therapy   Manual Therapy  Edema management;Compression Bandaging;Manual Lymphatic Drainage (MLD)    Manual therapy comments  skin care w/ low ph castor oil throughout MLD to increase skin hydration and tissue mobuility/flexibility.    Manual Lymphatic Drainage (MLD)  MLD to LLE as established. Added fibrosis technique  and myofacial release at ankle and distal leg to increase tissue flexibility    Compression Bandaging  Pt  dons compression stocking after session.             OT Education - 03/07/20 1512    Education Details  Continued skilled Pt/caregiver education  And LE ADL training throughout visit for lymphedema self care/ home program, including compression wrapping, compression garment and device wear/care, lymphatic pumping ther ex, simple self-MLD, and skin care. Discussed progress towards goals.    Person(s) Educated  Patient    Methods  Explanation;Demonstration    Comprehension  Verbalized understanding;Returned demonstration          OT Long Term Goals - 02/15/20 1623      OT LONG TERM GOAL #1   Title  Pt will demonstrate understanding of lymphedema (LE) precautions / prevention principals, including signs / symptoms of cellulitis infection with modified independence using LE Workbook as printed reference to identify 6 precautions without verbal cues by end of 3rd  OT Rx visit.     Baseline  erbal cues    Time  3    Period  Days    Status  Achieved      OT LONG TERM GOAL #2   Title  Pt will be able todon and doff existing compression garments independently and without assistive devices and/ or extra time for optimal lymphedema self management over time and to limit infection risk.    Baseline  supervision    Time  4    Period  Days    Status  Achieved      OT LONG TERM GOAL #3   Title  Pt to achieve at least  10% limb volume reduction in affected limb(s)  bilaterally during Intensive Phase CDT to control limb swelling, to improve tissue integrity and immune function, to improve ADLs performance and to improve functional mobility/ transfer, and to improve body image and self-esteem.    Baseline  max A    Time  8    Period  Weeks    Status  On-going   decreased 1.05% on 02/15/20     OT LONG TERM GOAL #4   Title  Pt will achieve 100% compliance with daily LE self-care home  program components, including daily  skin care, simple self-MLD, gradient compression wraps/ compression garments/devices, and therapeutic exercise with Mod CG support to ensure optimal Intensive Phase limb volume reduction to expedite compression garment/ device fitting.    Baseline  50% compliant    Time  12    Period  Weeks    Status  On-going      OT LONG TERM GOAL #5   Title  Pt will use appropriate  assistive devices during LE self-care training to don compression garments/devices with modified independence to ensure optimal LE self-management over time and to limit progression  of chronic LE.    Baseline  min A    Time  12    Period  Weeks    Status  Deferred   DC goal     OT LONG TERM GOAL #6   Title  Pt will retain optimal limb volume reductions achieved during Intensive Phase CDT with no more than 3% volume increase with ongoing CG assistance to limit LE progression and further functional decline.    Baseline  min A    Time  6    Period  Months    Status  On-going            Plan - 03/07/20 1513    Clinical Impression Statement  Provided MLD and skin  care to LLE. After full leg sequences provided deep fibrosis techniques with slight softening and increased joiint flexibility at ankles noted at end of session. Pt reports decreased discomfort after manual therapy. Cont as per POC.       Patient will benefit from skilled therapeutic intervention in order to improve the following deficits and impairments:           Visit Diagnosis: Lymphedema, not elsewhere classified    Problem List Patient Active Problem List   Diagnosis Date Noted  . Hyperlipidemia 02/14/2020  . Obesity 02/14/2020  . Vitamin D deficiency 06/28/2019  . Prediabetes 06/28/2019  . GERD (gastroesophageal reflux disease) 12/07/2018  . Vitamin B12 deficiency 12/05/2018  . Senile purpura ([place]) 07/28/2018  . Chronic venous insufficiency 07/14/2018  . Lymphedema 07/14/2018  . Aneurysm of  descending thoracic aorta (HCC) 05/04/2018  . Allergic rhinitis 02/16/2018  . Aortic atherosclerosis ([place]) 01/13/2018  . Status post placement of implantable loop recorder 01/12/2018  . Chronic kidney disease, stage 3 01/12/2018  . History of CVA (cerebrovascular accident)   . TIA (transient ischemic attack) 01/04/2018  . CAD (coronary artery disease) 11/25/2017  . Caregiver stress 11/25/2017  . Menopause 08/14/2017  . De Quervain's tenosynovitis, right 07/17/2017  . Stress incontinence 06/19/2017  . Personal history of tobacco use, presenting hazards to health 04/01/2017  . Anxiety 03/13/2017  . Advanced care planning/counseling discussion 10/17/2016  . Benign neoplasm of sigmoid colon   . Centrilobular emphysema ([place]) 10/17/2015  . Mass of upper inner quadrant of right breast 06/22/2015    Andrey Spearman, MS, OTR/L, [facility] 03/07/20 3:14 PM  [place] MAIN [facility] SERVICES [address] [place], [name], [address] Phone: [phone]   Fax:  [phone]  Name: BUSHRA MOCK MRN: [id number] Date of Birth: 1946/05/20

## 2020-03-09 ENCOUNTER — Telehealth: Payer: Self-pay

## 2020-03-15 ENCOUNTER — Encounter

## 2020-03-16 ENCOUNTER — Ambulatory Visit (INDEPENDENT_AMBULATORY_CARE_PROVIDER_SITE_OTHER): Payer: Medicare Other

## 2020-03-16 ENCOUNTER — Other Ambulatory Visit: Payer: Self-pay

## 2020-03-16 DIAGNOSIS — E538 Deficiency of other specified B group vitamins: Secondary | ICD-10-CM

## 2020-03-16 MED ORDER — CYANOCOBALAMIN 1000 MCG/ML IJ SOLN
1000.0000 ug | Freq: Once | INTRAMUSCULAR | Status: AC
  Administered 2020-03-16: 1000 ug via INTRAMUSCULAR

## 2020-03-21 ENCOUNTER — Other Ambulatory Visit: Payer: Self-pay

## 2020-03-21 ENCOUNTER — Ambulatory Visit: Payer: Medicare Other | Attending: Nurse Practitioner | Admitting: Occupational Therapy

## 2020-03-21 DIAGNOSIS — I890 Lymphedema, not elsewhere classified: Secondary | ICD-10-CM | POA: Insufficient documentation

## 2020-03-21 NOTE — Therapy (Signed)
[place] MAIN [facility] SERVICES [address] [place], [name], [address] Phone: [phone]   Fax:  [phone]  Occupational Therapy Treatment  Patient Details  Name: Sharon [name] MRN: [id number] Date of Birth: 11/13/1945 Referring Provider (OT): Eulogio Ditch, NP   Encounter Date: 03/21/2020  OT End of Session - 03/21/20 1608    Visit Number  27    Number of Visits  36    Date for OT Re-Evaluation  06/19/20    OT Start Time  0300    OT Stop Time  0400    OT Time Calculation (min)  60 min    Activity Tolerance  Patient tolerated treatment well;No increased pain    Behavior During Therapy  WFL for tasks assessed/performed       Past Medical History:  Diagnosis Date  . Anxiety   . Arthritis    hands, upper back  . Asthma   . COPD (chronic obstructive pulmonary disease) ([place])   . History of cervical cancer   . Menopausal disorder   . Osteoporosis   . Pneumonia 1960  . Spasm of abdominal muscles of right side    intermittent  . TMJ (dislocation of temporomandibular joint)   . Wears dentures    partial lower    Past Surgical History:  Procedure Laterality Date  . ABDOMINAL HYSTERECTOMY  1970's  . bladder botox  2005  . BLADDER SUSPENSION  2004  . CATARACT EXTRACTION W/PHACO Right 03/09/2018   Procedure: CATARACT EXTRACTION PHACO AND INTRAOCULAR LENS PLACEMENT ([place]) right;  Surgeon: Eulogio Bear, MD;  Location: [place];  Service: Ophthalmology;  Laterality: Right;  CALL CELL 1ST  . CATARACT EXTRACTION W/PHACO Left 04/19/2018   Procedure: CATARACT EXTRACTION PHACO AND INTRAOCULAR LENS PLACEMENT (IOC)  LEFT;  Surgeon: Eulogio Bear, MD;  Location: [place];  Service: Ophthalmology;  Laterality: Left;  . COLONOSCOPY WITH PROPOFOL N/A 12/13/2015   Procedure: COLONOSCOPY WITH PROPOFOL;  Surgeon: Lucilla Lame, MD;  Location: [place];  Service: Endoscopy;  Laterality: N/A;  . LOOP RECORDER INSERTION  N/A 01/07/2018   Procedure: LOOP RECORDER INSERTION;  Surgeon: Deboraha Sprang, MD;  Location: [place] [facility];  Service: Cardiovascular;  Laterality: N/A;  . POLYPECTOMY N/A 12/13/2015   Procedure: POLYPECTOMY;  Surgeon: Lucilla Lame, MD;  Location: [place];  Service: Endoscopy;  Laterality: N/A;  SIGMOID COLON POLYPS X  5  . SHOULDER ARTHROSCOPY W/ ROTATOR CUFF REPAIR Right 1998  . TEE WITHOUT CARDIOVERSION N/A 01/06/2018   Procedure: TRANSESOPHAGEAL ECHOCARDIOGRAM (TEE);  Surgeon: Minna Merritts, MD;  Location: [facility];  Service: Cardiovascular;  Laterality: N/A;  . TONSILLECTOMY AND ADENOIDECTOMY      There were no vitals filed for this visit.  Subjective Assessment - 03/21/20 1609    Subjective   Mrs Willisha Sligar for OT visit 25 of 36 to address BLE Lymphedema. Pt has no new concerns. Pt reports tightness in legs, but does not rate pain numerically today.    Pertinent History  Hx anxiety, COPD, OA, Asthma, hx cervical ca, osteoporosis, hx pneumonia, hx CVA, hx LLE cellulitis, hx L leg wound, hx falls    Limitations  chronic BLE leg swelling and associated pain, decreased balance, fall risk, decreased standing, walking and sitting tolerance, difficulty w/ lower body bathing and dressing, difficulty fitting LB clothing and shoes, unable to drive, impaired transfers and functional mobility, impaired social participation, impaired ability to perform household chores, cooking, meal  prep    Pain Onset  More than a month ago                   OT Treatments/Exercises (OP) - 03/21/20 0001      ADLs   ADL Education Given  Yes      Manual Therapy   Manual Therapy  Edema management;Compression Bandaging;Manual Lymphatic Drainage (MLD)    Manual therapy comments  skin care w/ low ph castor oil throughout MLD to increase skin hydration and tissue mobuility/flexibility.    Manual Lymphatic Drainage (MLD)  MLD to RLE as established. Added fibrosis technique and  myofacial release at ankle and distal leg to increase tissue flexibility    Compression Bandaging  Pt  dons compression stocking after session.             OT Education - 03/21/20 1610    Education Details  Continued skilled Pt/caregiver education  And LE ADL training throughout visit for lymphedema self care/ home program, including compression wrapping, compression garment and device wear/care, lymphatic pumping ther ex, simple self-MLD, and skin care. Discussed progress towards goals.    Person(s) Educated  Patient    Methods  Explanation;Demonstration    Comprehension  Verbalized understanding;Returned demonstration          OT Long Term Goals - 03/21/20 1611      OT LONG TERM GOAL #1   Title  Pt will demonstrate understanding of lymphedema (LE) precautions / prevention principals, including signs / symptoms of cellulitis infection with modified independence using LE Workbook as printed reference to identify 6 precautions without verbal cues by end of 3rd  OT Rx visit.     Time  3    Period  Days    Status  Achieved      OT LONG TERM GOAL #2   Title  Pt will be able todon and doff existing compression garments independently and without assistive devices and/ or extra time for optimal lymphedema self management over time and to limit infection risk.    Baseline  supervision    Time  4    Period  Days    Status  Achieved      OT LONG TERM GOAL #3   Title  Pt to achieve at least  10% limb volume reduction in affected limb(s)  bilaterally during Intensive Phase CDT to control limb swelling, to improve tissue integrity and immune function, to improve ADLs performance and to improve functional mobility/ transfer, and to improve body image and self-esteem.    Baseline  max A    Time  8    Period  Weeks    Status  On-going   decreased 1.05% on 02/15/20     OT LONG TERM GOAL #4   Title  Pt will achieve 100% compliance with daily LE self-care home program components, including  daily  skin care, simple self-MLD, gradient compression wraps/ compression garments/devices, and therapeutic exercise with Mod CG support to ensure optimal Intensive Phase limb volume reduction to expedite compression garment/ device fitting.    Baseline  50% compliant    Time  12    Period  Weeks    Status  Achieved      OT LONG TERM GOAL #5   Title  Pt will use appropriate  assistive devices during LE self-care training to don compression garments/devices with modified independence to ensure optimal LE self-management over time and to limit progression of chronic LE.    Baseline  min A    Time  12    Period  Weeks    Status  Achieved   DC goal     OT LONG TERM GOAL #6   Title  Pt will retain optimal limb volume reductions achieved during Intensive Phase CDT with no more than 3% volume increase with ongoing CG assistance to limit LE progression and further functional decline.    Baseline  min A    Time  6    Period  Months    Status  On-going            Plan - 03/21/20 1615    Clinical Impression Statement  Provided MLD and skin  bilaterally w/ emphasis on LLE. After fibrosis techniques and soft tissue mobilization  Pt has improved flexibility in dorsiflexion and plantar flexion . Limd swelling is mildly increased initially, but after MLD skin creases at cY is resolved. Cont as per POC.       Patient will benefit from skilled therapeutic intervention in order to improve the following deficits and impairments:           Visit Diagnosis: Lymphedema, not elsewhere classified - Plan: Ot plan of care cert/re-cert    Problem List Patient Active Problem List   Diagnosis Date Noted  . Hyperlipidemia 02/14/2020  . Obesity 02/14/2020  . Vitamin D deficiency 06/28/2019  . Prediabetes 06/28/2019  . GERD (gastroesophageal reflux disease) 12/07/2018  . Vitamin B12 deficiency 12/05/2018  . Senile purpura ([place]) 07/28/2018  . Chronic venous insufficiency 07/14/2018  .  Lymphedema 07/14/2018  . Aneurysm of descending thoracic aorta (HCC) 05/04/2018  . Allergic rhinitis 02/16/2018  . Aortic atherosclerosis ([place]) 01/13/2018  . Status post placement of implantable loop recorder 01/12/2018  . Chronic kidney disease, stage 3 01/12/2018  . History of CVA (cerebrovascular accident)   . TIA (transient ischemic attack) 01/04/2018  . CAD (coronary artery disease) 11/25/2017  . Caregiver stress 11/25/2017  . Menopause 08/14/2017  . De Quervain's tenosynovitis, right 07/17/2017  . Stress incontinence 06/19/2017  . Personal history of tobacco use, presenting hazards to health 04/01/2017  . Anxiety 03/13/2017  . Advanced care planning/counseling discussion 10/17/2016  . Benign neoplasm of sigmoid colon   . Centrilobular emphysema ([place]) 10/17/2015  . Mass of upper inner quadrant of right breast 06/22/2015    Andrey Spearman, MS, OTR/L, [facility] 03/21/20 4:16 PM  [place] MAIN [facility] SERVICES [address] [place], [name], [address] Phone: [phone]   Fax:  [phone]  Name: CHERIDAN KIBLER MRN: [id number] Date of Birth: 06/21/1946

## 2020-03-28 ENCOUNTER — Other Ambulatory Visit: Payer: Self-pay

## 2020-03-28 ENCOUNTER — Ambulatory Visit: Payer: Medicare Other | Admitting: Occupational Therapy

## 2020-03-28 DIAGNOSIS — I890 Lymphedema, not elsewhere classified: Secondary | ICD-10-CM | POA: Diagnosis not present

## 2020-03-30 NOTE — Therapy (Signed)
[place] MAIN [facility] SERVICES [address] [place], [name], [address] Phone: [phone]   Fax:  [phone]  Occupational Therapy Treatment  Patient Details  Name: Sharon [name] MRN: [id number] Date of Birth: 1946/07/08 Referring Provider (OT): Eulogio Ditch, NP   Encounter Date: 03/28/2020   OT End of Session - 03/30/20 1330    Number of Visits 36    Date for OT Re-Evaluation 06/19/20    Activity Tolerance Patient tolerated treatment well;No increased pain    Behavior During Therapy WFL for tasks assessed/performed           Past Medical History:  Diagnosis Date  . Anxiety   . Arthritis    hands, upper back  . Asthma   . COPD (chronic obstructive pulmonary disease) ([place])   . History of cervical cancer   . Menopausal disorder   . Osteoporosis   . Pneumonia 1960  . Spasm of abdominal muscles of right side    intermittent  . TMJ (dislocation of temporomandibular joint)   . Wears dentures    partial lower    Past Surgical History:  Procedure Laterality Date  . ABDOMINAL HYSTERECTOMY  1970's  . bladder botox  2005  . BLADDER SUSPENSION  2004  . CATARACT EXTRACTION W/PHACO Right 03/09/2018   Procedure: CATARACT EXTRACTION PHACO AND INTRAOCULAR LENS PLACEMENT ([place]) right;  Surgeon: Eulogio Bear, MD;  Location: [place];  Service: Ophthalmology;  Laterality: Right;  CALL CELL 1ST  . CATARACT EXTRACTION W/PHACO Left 04/19/2018   Procedure: CATARACT EXTRACTION PHACO AND INTRAOCULAR LENS PLACEMENT (I[facility])  LEFT;  Surgeon: Eulogio Bear, MD;  Location: [place];  Service: Ophthalmology;  Laterality: Left;  . COLONOSCOPY WITH PROPOFOL N/A 12/13/2015   Procedure: COLONOSCOPY WITH PROPOFOL;  Surgeon: Lucilla Lame, MD;  Location: [place];  Service: Endoscopy;  Laterality: N/A;  . LOOP RECORDER INSERTION N/A 01/07/2018   Procedure: LOOP RECORDER INSERTION;  Surgeon: Deboraha Sprang, MD;  Location: [place] [facility];  Service: Cardiovascular;  Laterality: N/A;  . POLYPECTOMY N/A 12/13/2015   Procedure: POLYPECTOMY;  Surgeon: Lucilla Lame, MD;  Location: [place];  Service: Endoscopy;  Laterality: N/A;  SIGMOID COLON POLYPS X  5  . SHOULDER ARTHROSCOPY W/ ROTATOR CUFF REPAIR Right 1998  . TEE WITHOUT CARDIOVERSION N/A 01/06/2018   Procedure: TRANSESOPHAGEAL ECHOCARDIOGRAM (TEE);  Surgeon: Minna Merritts, MD;  Location: [facility];  Service: Cardiovascular;  Laterality: N/A;  . TONSILLECTOMY AND ADENOIDECTOMY      There were no vitals filed for this visit.   Subjective Assessment - 03/30/20 1330    Subjective  Sharon [name] for OT visit 27 of 36 to address BLE Lymphedema. Pt has no new concerns. Pt reports tightness in legs, but does not rate pain numerically today.    Pertinent History Hx anxiety, COPD, OA, Asthma, hx cervical ca, osteoporosis, hx pneumonia, hx CVA, hx LLE cellulitis, hx L leg wound, hx falls    Limitations chronic BLE leg swelling and associated pain, decreased balance, fall risk, decreased standing, walking and sitting tolerance, difficulty w/ lower body bathing and dressing, difficulty fitting LB clothing and shoes, unable to drive, impaired transfers and functional mobility, impaired social participation, impaired ability to perform household chores, cooking, meal prep    Pain Onset More than a month ago  OT Education - 03/30/20 1330    Education Details Continued skilled Pt/caregiver education  And LE ADL training throughout visit for lymphedema self care/ home program, including compression wrapping, compression garment and device wear/care, lymphatic pumping ther ex, simple self-MLD, and skin care. Discussed progress towards goals.    Person(s) Educated Patient    Methods Explanation;Demonstration    Comprehension Verbalized understanding;Returned demonstration               OT Long Term Goals  - 03/21/20 1611      OT LONG TERM GOAL #1   Title Pt will demonstrate understanding of lymphedema (LE) precautions / prevention principals, including signs / symptoms of cellulitis infection with modified independence using LE Workbook as printed reference to identify 6 precautions without verbal cues by end of 3rd  OT Rx visit.     Time 3    Period Days    Status Achieved      OT LONG TERM GOAL #2   Title Pt will be able todon and doff existing compression garments independently and without assistive devices and/ or extra time for optimal lymphedema self management over time and to limit infection risk.    Baseline supervision    Time 4    Period Days    Status Achieved      OT LONG TERM GOAL #3   Title Pt to achieve at least  10% limb volume reduction in affected limb(s)  bilaterally during Intensive Phase CDT to control limb swelling, to improve tissue integrity and immune function, to improve ADLs performance and to improve functional mobility/ transfer, and to improve body image and self-esteem.    Baseline max A    Time 8    Period Weeks    Status On-going   decreased 1.05% on 02/15/20     OT LONG TERM GOAL #4   Title Pt will achieve 100% compliance with daily LE self-care home program components, including daily  skin care, simple self-MLD, gradient compression wraps/ compression garments/devices, and therapeutic exercise with Mod CG support to ensure optimal Intensive Phase limb volume reduction to expedite compression garment/ device fitting.    Baseline 50% compliant    Time 12    Period Weeks    Status Achieved      OT LONG TERM GOAL #5   Title Pt will use appropriate  assistive devices during LE self-care training to don compression garments/devices with modified independence to ensure optimal LE self-management over time and to limit progression of chronic LE.    Baseline min A    Time 12    Period Weeks    Status Achieved   DC goal     OT LONG TERM GOAL #6   Title Pt  will retain optimal limb volume reductions achieved during Intensive Phase CDT with no more than 3% volume increase with ongoing CG assistance to limit LE progression and further functional decline.    Baseline min A    Time 6    Period Months    Status On-going                 Plan - 03/30/20 1330    Clinical Impression Statement Pt tolerated MLD with deep bliateral fibrosis techniques. Pt reported decreased pain ad tigtness pain when standing after manual therapy . Cont as per POC.           Patient will benefit from skilled therapeutic intervention in order to improve the following deficits and impairments:  Visit Diagnosis: Lymphedema, not elsewhere classified    Problem List Patient Active Problem List   Diagnosis Date Noted  . Hyperlipidemia 02/14/2020  . Obesity 02/14/2020  . Vitamin D deficiency 06/28/2019  . Prediabetes 06/28/2019  . GERD (gastroesophageal reflux disease) 12/07/2018  . Vitamin B12 deficiency 12/05/2018  . Senile purpura ([place]) 07/28/2018  . Chronic venous insufficiency 07/14/2018  . Lymphedema 07/14/2018  . Aneurysm of descending thoracic aorta (HCC) 05/04/2018  . Allergic rhinitis 02/16/2018  . Aortic atherosclerosis ([place]) 01/13/2018  . Status post placement of implantable loop recorder 01/12/2018  . Chronic kidney disease, stage 3 01/12/2018  . History of CVA (cerebrovascular accident)   . TIA (transient ischemic attack) 01/04/2018  . CAD (coronary artery disease) 11/25/2017  . Caregiver stress 11/25/2017  . Menopause 08/14/2017  . De Quervain's tenosynovitis, right 07/17/2017  . Stress incontinence 06/19/2017  . Personal history of tobacco use, presenting hazards to health 04/01/2017  . Anxiety 03/13/2017  . Advanced care planning/counseling discussion 10/17/2016  . Benign neoplasm of sigmoid colon   . Centrilobular emphysema ([place]) 10/17/2015  . Mass of upper inner quadrant of right breast 06/22/2015    Ansel Bong 03/30/2020, 1:31 PM  [place] MAIN [facility] SERVICES [address] [place], [name], [address] Phone: [phone]   Fax:  [phone]  Name: Sharon [name] MRN: [id number] Date of Birth: 1946-08-05

## 2020-04-04 ENCOUNTER — Other Ambulatory Visit: Payer: Self-pay

## 2020-04-04 ENCOUNTER — Ambulatory Visit: Payer: Medicare Other | Admitting: Occupational Therapy

## 2020-04-04 DIAGNOSIS — I890 Lymphedema, not elsewhere classified: Secondary | ICD-10-CM

## 2020-04-04 NOTE — Therapy (Signed)
[place] MAIN [facility] SERVICES [address] [place], [name], [address] Phone: [phone]   Fax:  [phone]  Occupational Therapy Treatment  Patient Details  Name: Sharon [name] MRN: [id number] Date of Birth: [birth year] Referring Provider (OT): Eulogio Ditch, NP   Encounter Date: 04/04/2020   OT End of Session - 04/04/20 1659    Visit Number 29    Number of Visits 36    Date for OT Re-Evaluation 06/19/20    OT Start Time 0106    OT Stop Time 0206    OT Time Calculation (min) 60 min    Activity Tolerance Patient tolerated treatment well;No increased pain    Behavior During Therapy WFL for tasks assessed/performed           Past Medical History:  Diagnosis Date  . Anxiety   . Arthritis    hands, upper back  . Asthma   . COPD (chronic obstructive pulmonary disease) ([place])   . History of cervical cancer   . Menopausal disorder   . Osteoporosis   . Pneumonia 1960  . Spasm of abdominal muscles of right side    intermittent  . TMJ (dislocation of temporomandibular joint)   . Wears dentures    partial lower    Past Surgical History:  Procedure Laterality Date  . ABDOMINAL HYSTERECTOMY  1970's  . bladder botox  2005  . BLADDER SUSPENSION  2004  . CATARACT EXTRACTION W/PHACO Right 03/09/2018   Procedure: CATARACT EXTRACTION PHACO AND INTRAOCULAR LENS PLACEMENT ([place]) right;  Surgeon: Eulogio Bear, MD;  Location: [place];  Service: Ophthalmology;  Laterality: Right;  CALL CELL 1ST  . CATARACT EXTRACTION W/PHACO Left 04/19/2018   Procedure: CATARACT EXTRACTION PHACO AND INTRAOCULAR LENS PLACEMENT ([facility])  LEFT;  Surgeon: Eulogio Bear, MD;  Location: [place];  Service: Ophthalmology;  Laterality: Left;  . COLONOSCOPY WITH PROPOFOL N/A 12/13/2015   Procedure: COLONOSCOPY WITH PROPOFOL;  Surgeon: Lucilla Lame, MD;  Location: [place];  Service: Endoscopy;  Laterality: N/A;  . LOOP RECORDER INSERTION  N/A 01/07/2018   Procedure: LOOP RECORDER INSERTION;  Surgeon: Deboraha Sprang, MD;  Location: [place] [facility];  Service: Cardiovascular;  Laterality: N/A;  . POLYPECTOMY N/A 12/13/2015   Procedure: POLYPECTOMY;  Surgeon: Lucilla Lame, MD;  Location: [place];  Service: Endoscopy;  Laterality: N/A;  SIGMOID COLON POLYPS X  5  . SHOULDER ARTHROSCOPY W/ ROTATOR CUFF REPAIR Right 1998  . TEE WITHOUT CARDIOVERSION N/A 01/06/2018   Procedure: TRANSESOPHAGEAL ECHOCARDIOGRAM (TEE);  Surgeon: Minna Merritts, MD;  Location: [facility];  Service: Cardiovascular;  Laterality: N/A;  . TONSILLECTOMY AND ADENOIDECTOMY      There were no vitals filed for this visit.   Subjective Assessment - 04/04/20 1313    Subjective  Sharon [name] for OT visit 28 of 36 to address BLE Lymphedema. Pt expresses concern about OT visits coming to an end in the near future. "This really helps my legs. It willl help reduce the pain and swelling until about Saturday."    Pertinent History Hx anxiety, COPD, OA, Asthma, hx cervical ca, osteoporosis, hx pneumonia, hx CVA, hx LLE cellulitis, hx L leg wound, hx falls    Limitations chronic BLE leg swelling and associated pain, decreased balance, fall risk, decreased standing, walking and sitting tolerance, difficulty w/ lower body bathing and dressing, difficulty fitting LB clothing and shoes, unable to drive, impaired transfers and functional mobility, impaired social participation,  impaired ability to perform household chores, cooking, meal prep    Pain Onset More than a month ago                        OT Treatments/Exercises (OP) - 04/04/20 0001      ADLs   ADL Education Given Yes      Manual Therapy   Manual Therapy Edema management    Manual therapy comments skin care w/ low ph castor oil throughout MLD to increase skin hydration and tissue mobuility/flexibility.    Manual Lymphatic Drainage (MLD) MLD to RLE as established. Added fibrosis  technique and myofacial release at ankle and distal leg to increase tissue flexibility    Compression Bandaging Pt  dons compression stocking after session.                  OT Education - 04/04/20 1655    Education Details Pt education regarding importance of LE self care over time to limit infection risk and debilitating progression. Reviewed transition frm Intensive Phase to Self-Management Phase of CDT and plan for long term support PRN.    Person(s) Educated Patient    Methods Explanation;Demonstration    Comprehension Verbalized understanding;Returned demonstration               OT Long Term Goals - 03/21/20 1611      OT LONG TERM GOAL #1   Title Pt will demonstrate understanding of lymphedema (LE) precautions / prevention principals, including signs / symptoms of cellulitis infection with modified independence using LE Workbook as printed reference to identify 6 precautions without verbal cues by end of 3rd  OT Rx visit.     Time 3    Period Days    Status Achieved      OT LONG TERM GOAL #2   Title Pt will be able todon and doff existing compression garments independently and without assistive devices and/ or extra time for optimal lymphedema self management over time and to limit infection risk.    Baseline supervision    Time 4    Period Days    Status Achieved      OT LONG TERM GOAL #3   Title Pt to achieve at least  10% limb volume reduction in affected limb(s)  bilaterally during Intensive Phase CDT to control limb swelling, to improve tissue integrity and immune function, to improve ADLs performance and to improve functional mobility/ transfer, and to improve body image and self-esteem.    Baseline max A    Time 8    Period Weeks    Status On-going   decreased 1.05% on 02/15/20     OT LONG TERM GOAL #4   Title Pt will achieve 100% compliance with daily LE self-care home program components, including daily  skin care, simple self-MLD, gradient compression  wraps/ compression garments/devices, and therapeutic exercise with Mod CG support to ensure optimal Intensive Phase limb volume reduction to expedite compression garment/ device fitting.    Baseline 50% compliant    Time 12    Period Weeks    Status Achieved      OT LONG TERM GOAL #5   Title Pt will use appropriate  assistive devices during LE self-care training to don compression garments/devices with modified independence to ensure optimal LE self-management over time and to limit progression of chronic LE.    Baseline min A    Time 12    Period Weeks  Status Achieved   DC goal     OT LONG TERM GOAL #6   Title Pt will retain optimal limb volume reductions achieved during Intensive Phase CDT with no more than 3% volume increase with ongoing CG assistance to limit LE progression and further functional decline.    Baseline min A    Time 6    Period Months    Status On-going                  Patient will benefit from skilled therapeutic intervention in order to improve the following deficits and impairments:           Visit Diagnosis: Lymphedema, not elsewhere classified    Problem List Patient Active Problem List   Diagnosis Date Noted  . Hyperlipidemia 02/14/2020  . Obesity 02/14/2020  . Vitamin D deficiency 06/28/2019  . Prediabetes 06/28/2019  . GERD (gastroesophageal reflux disease) 12/07/2018  . Vitamin B12 deficiency 12/05/2018  . Senile purpura ([place]) 07/28/2018  . Chronic venous insufficiency 07/14/2018  . Lymphedema 07/14/2018  . Aneurysm of descending thoracic aorta (HCC) 05/04/2018  . Allergic rhinitis 02/16/2018  . Aortic atherosclerosis ([place]) 01/13/2018  . Status post placement of implantable loop recorder 01/12/2018  . Chronic kidney disease, stage 3 01/12/2018  . History of CVA (cerebrovascular accident)   . TIA (transient ischemic attack) 01/04/2018  . CAD (coronary artery disease) 11/25/2017  . Caregiver stress 11/25/2017  . Menopause  08/14/2017  . De Quervain's tenosynovitis, right 07/17/2017  . Stress incontinence 06/19/2017  . Personal history of tobacco use, presenting hazards to health 04/01/2017  . Anxiety 03/13/2017  . Advanced care planning/counseling discussion 10/17/2016  . Benign neoplasm of sigmoid colon   . Centrilobular emphysema ([place]) 10/17/2015  . Mass of upper inner quadrant of right breast 06/22/2015   Andrey Spearman, MS, OTR/L, [facility] 04/04/20 5:01 PM   [place] MAIN [facility] SERVICES [address] [place], [name], [address] Phone: [phone]   Fax:  [phone]  Name: Sharon [name] MRN: [id number] Date of Birth: 1945-11-09

## 2020-04-09 ENCOUNTER — Ambulatory Visit (INDEPENDENT_AMBULATORY_CARE_PROVIDER_SITE_OTHER): Payer: Medicare Other

## 2020-04-09 ENCOUNTER — Ambulatory Visit (INDEPENDENT_AMBULATORY_CARE_PROVIDER_SITE_OTHER): Payer: Medicare Other | Admitting: *Deleted

## 2020-04-09 VITALS — Ht 69.0 in | Wt 220.0 lb

## 2020-04-09 DIAGNOSIS — Z0000 Encounter for general adult medical examination without abnormal findings: Secondary | ICD-10-CM | POA: Diagnosis not present

## 2020-04-09 DIAGNOSIS — I6310 Cerebral infarction due to embolism of unspecified precerebral artery: Secondary | ICD-10-CM

## 2020-04-09 LAB — CUP PACEART REMOTE DEVICE CHECK
Date Time Interrogation Session: 20210627232858
Implantable Pulse Generator Implant Date: 20190328

## 2020-04-09 NOTE — Patient Instructions (Signed)
Sharon [name] , Thank you for taking time to come for your Medicare Wellness Visit. I appreciate your ongoing commitment to your health goals. Please review the following plan we discussed and let me know if I can assist you in the future.   Screening recommendations/referrals: Colonoscopy: completed 12/13/2015, due 12/12/2020 Mammogram: completed 07/05/2019, due 07/04/2020 Bone Density: completed 08/05/2017 Recommended yearly ophthalmology/optometry visit for glaucoma screening and checkup Recommended yearly dental visit for hygiene and checkup  Vaccinations: Influenza vaccine: completed 07/21/2019, due 05/13/2020 Pneumococcal vaccine: completed 09/25/2014 Tdap vaccine: completed 09/25/2014, due 09/25/2024 Shingles vaccine: discussed   Covid-19:12/27/2019, 11/27/2019  Advanced directives: Advance directive discussed with you today. Even though you declined this today please call our office should you change your mind and we can give you the proper paperwork for you to fill out.   Conditions/risks identified: obesity  Next appointment: Follow up in one year for your annual wellness visit    Preventive Care 51 Years and Older, Female Preventive care refers to lifestyle choices and visits with your health care provider that can promote health and wellness. What does preventive care include?  A yearly physical exam. This is also called an annual well check.  Dental exams once or twice a year.  Routine eye exams. Ask your health care provider how often you should have your eyes checked.  Personal lifestyle choices, including:  Daily care of your teeth and gums.  Regular physical activity.  Eating a healthy diet.  Avoiding tobacco and drug use.  Limiting alcohol use.  Practicing safe sex.  Taking low-dose aspirin every day.  Taking vitamin and mineral supplements as recommended by your health care provider. What happens during an annual well check? The services and screenings done  by your health care provider during your annual well check will depend on your age, overall health, lifestyle risk factors, and family history of disease. Counseling  Your health care provider may ask you questions about your:  Alcohol use.  Tobacco use.  Drug use.  Emotional well-being.  Home and relationship well-being.  Sexual activity.  Eating habits.  History of falls.  Memory and ability to understand (cognition).  Work and work Statistician.  Reproductive health. Screening  You may have the following tests or measurements:  Height, weight, and BMI.  Blood pressure.  Lipid and cholesterol levels. These may be checked every 5 years, or more frequently if you are over 65 years old.  Skin check.  Lung cancer screening. You may have this screening every year starting at age 70 if you have a 30-pack-year history of smoking and currently smoke or have quit within the past 15 years.  Fecal occult blood test (FOBT) of the stool. You may have this test every year starting at age 74.  Flexible sigmoidoscopy or colonoscopy. You may have a sigmoidoscopy every 5 years or a colonoscopy every 10 years starting at age 85.  Hepatitis C blood test.  Hepatitis B blood test.  Sexually transmitted disease (STD) testing.  Diabetes screening. This is done by checking your blood sugar (glucose) after you have not eaten for a while (fasting). You may have this done every 1-3 years.  Bone density scan. This is done to screen for osteoporosis. You may have this done starting at age 30.  Mammogram. This may be done every 1-2 years. Talk to your health care provider about how often you should have regular mammograms. Talk with your health care provider about your test results, treatment options, and if necessary,  the need for more tests. Vaccines  Your health care provider may recommend certain vaccines, such as:  Influenza vaccine. This is recommended every year.  Tetanus,  diphtheria, and acellular pertussis (Tdap, Td) vaccine. You may need a Td booster every 10 years.  Zoster vaccine. You may need this after age 63.  Pneumococcal 13-valent conjugate (PCV13) vaccine. One dose is recommended after age 24.  Pneumococcal polysaccharide (PPSV23) vaccine. One dose is recommended after age 80. Talk to your health care provider about which screenings and vaccines you need and how often you need them. This information is not intended to replace advice given to you by your health care provider. Make sure you discuss any questions you have with your health care provider. Document Released: 10/26/2015 Document Revised: 06/18/2016 Document Reviewed: 07/31/2015 Elsevier Interactive Patient Education  2017 [employer] Prevention in the Home Falls can cause injuries. They can happen to people of all ages. There are many things you can do to make your home safe and to help prevent falls. What can I do on the outside of my home?  Regularly fix the edges of walkways and driveways and fix any cracks.  Remove anything that might make you trip as you walk through a door, such as a raised step or threshold.  Trim any bushes or trees on the path to your home.  Use bright outdoor lighting.  Clear any walking paths of anything that might make someone trip, such as rocks or tools.  Regularly check to see if handrails are loose or broken. Make sure that both sides of any steps have handrails.  Any raised decks and porches should have guardrails on the edges.  Have any leaves, snow, or ice cleared regularly.  Use sand or salt on walking paths during winter.  Clean up any spills in your garage right away. This includes oil or grease spills. What can I do in the bathroom?  Use night lights.  Install grab bars by the toilet and in the tub and shower. Do not use towel bars as grab bars.  Use non-skid mats or decals in the tub or shower.  If you need to sit down in  the shower, use a plastic, non-slip stool.  Keep the floor dry. Clean up any water that spills on the floor as soon as it happens.  Remove soap buildup in the tub or shower regularly.  Attach bath mats securely with double-sided non-slip rug tape.  Do not have throw rugs and other things on the floor that can make you trip. What can I do in the bedroom?  Use night lights.  Make sure that you have a light by your bed that is easy to reach.  Do not use any sheets or blankets that are too big for your bed. They should not hang down onto the floor.  Have a firm chair that has side arms. You can use this for support while you get dressed.  Do not have throw rugs and other things on the floor that can make you trip. What can I do in the kitchen?  Clean up any spills right away.  Avoid walking on wet floors.  Keep items that you use a lot in easy-to-reach places.  If you need to reach something above you, use a strong step stool that has a grab bar.  Keep electrical cords out of the way.  Do not use floor polish or wax that makes floors slippery. If you must use  wax, use non-skid floor wax.  Do not have throw rugs and other things on the floor that can make you trip. What can I do with my stairs?  Do not leave any items on the stairs.  Make sure that there are handrails on both sides of the stairs and use them. Fix handrails that are broken or loose. Make sure that handrails are as long as the stairways.  Check any carpeting to make sure that it is firmly attached to the stairs. Fix any carpet that is loose or worn.  Avoid having throw rugs at the top or bottom of the stairs. If you do have throw rugs, attach them to the floor with carpet tape.  Make sure that you have a light switch at the top of the stairs and the bottom of the stairs. If you do not have them, ask someone to add them for you. What else can I do to help prevent falls?  Wear shoes that:  Do not have high  heels.  Have rubber bottoms.  Are comfortable and fit you well.  Are closed at the toe. Do not wear sandals.  If you use a stepladder:  Make sure that it is fully opened. Do not climb a closed stepladder.  Make sure that both sides of the stepladder are locked into place.  Ask someone to hold it for you, if possible.  Clearly mark and make sure that you can see:  Any grab bars or handrails.  First and last steps.  Where the edge of each step is.  Use tools that help you move around (mobility aids) if they are needed. These include:  Canes.  Walkers.  Scooters.  Crutches.  Turn on the lights when you go into a dark area. Replace any light bulbs as soon as they burn out.  Set up your furniture so you have a clear path. Avoid moving your furniture around.  If any of your floors are uneven, fix them.  If there are any pets around you, be aware of where they are.  Review your medicines with your doctor. Some medicines can make you feel dizzy. This can increase your chance of falling. Ask your doctor what other things that you can do to help prevent falls. This information is not intended to replace advice given to you by your health care provider. Make sure you discuss any questions you have with your health care provider. Document Released: 07/26/2009 Document Revised: 03/06/2016 Document Reviewed: 11/03/2014 Elsevier Interactive Patient Education  2017 [employer].

## 2020-04-09 NOTE — Progress Notes (Signed)
I connected with Sharon [name] today by telephone and verified that I am speaking with the correct person using two identifiers. Location patient: home Location provider: work Persons participating in the virtual visit: Liviah Cake, Glenna Durand LPN.   I discussed the limitations, risks, security and privacy concerns of performing an evaluation and management service by telephone and the availability of in person appointments. I also discussed with the patient that there may be a patient responsible charge related to this service. The patient expressed understanding and verbally consented to this telephonic visit.    Interactive audio and video telecommunications were attempted between this provider and patient, however failed, due to patient having technical difficulties OR patient did not have access to video capability.  We continued and completed visit with audio only.     Vital signs may be patient reported or missing .  Subjective:   Sharon [name] is a 74 y.o. female who presents for Medicare Annual (Subsequent) preventive examination.  Review of Systems     Cardiac Risk Factors include: advanced age (>22mn, >>51women);obesity (BMI >30kg/m2);sedentary lifestyle;dyslipidemia     Objective:    Today's Vitals   04/09/20 0811 04/09/20 0813  Weight: 220 lb (99.8 kg)   Height: 5' 9"  (1.753 m)   PainSc:  7    Body mass index is 32.49 kg/m.  Advanced Directives 04/09/2020 07/13/2019 05/28/2019 04/07/2019 08/31/2018 07/05/2018 04/19/2018  Does Patient Have a Medical Advance Directive? No No No No No No No  Does patient want to make changes to medical advance directive? - - - - - - -  Would patient like information on creating a medical advance directive? - No - Patient declined No - Patient declined - No - Patient declined - No - Patient declined    Current Medications (verified) Outpatient Encounter Medications as of 04/09/2020  Medication Sig  . albuterol (PROAIR HFA) 108 (90 Base)  MCG/ACT inhaler Inhale 2 puffs into the lungs every 6 (six) hours as needed for wheezing or shortness of breath.  . budesonide-formoterol (SYMBICORT) 160-4.5 MCG/ACT inhaler USE 2 PUFFS BY MOUTH TWICE DAILY.  . citalopram (CELEXA) 20 MG tablet Take 1 tablet (20 mg total) by mouth daily.  . clopidogrel (PLAVIX) 75 MG tablet TAKE 1 TABLET(75 MG) BY MOUTH DAILY  . Cyanocobalamin 1000 MCG/ML KIT Inject 1,000 mcg as directed every 30 (thirty) days.  . pantoprazole (PROTONIX) 40 MG tablet Take 1 tablet (40 mg total) by mouth daily.  . penicillin v potassium (VEETID) 250 MG tablet Take 1 tablet (250 mg total) by mouth 2 (two) times daily.  . rosuvastatin (CRESTOR) 20 MG tablet Take 1 tablet (20 mg total) by mouth 3 (three) times a week.  . benzonatate (TESSALON PERLES) 100 MG capsule Take 1 capsule (100 mg total) by mouth 3 (three) times daily as needed for cough. (Patient not taking: Reported on 11/10/2019)  . estradiol (VIVELLE-DOT) 0.1 MG/24HR patch APPLY 1 PATCH ONTO THE SKIN 2 TIMES A WEEK (Patient not taking: Reported on 04/09/2020)  . Loratadine (CLARITIN PO) Take by mouth daily. (Patient not taking: Reported on 04/09/2020)  . meclizine (ANTIVERT) 12.5 MG tablet Take 1 tablet (12.5 mg total) by mouth 3 (three) times daily as needed for dizziness (only if needed). (Patient not taking: Reported on 02/07/2020)  . montelukast (SINGULAIR) 10 MG tablet Take 1 tablet (10 mg total) by mouth at bedtime. (Patient not taking: Reported on 04/09/2020)  . Multiple Vitamin (MULTIVITAMIN) tablet Take 2 tablets by mouth daily.  (  Patient not taking: Reported on 04/09/2020)  . triamcinolone cream (KENALOG) 0.1 % Apply 1 application topically 2 (two) times daily. (Patient not taking: Reported on 04/09/2020)   No facility-administered encounter medications on file as of 04/09/2020.    Allergies (verified) Baclofen, Bactrim [sulfamethoxazole-trimethoprim], Ibuprofen, Latex, Librium [chlordiazepoxide], Naprosyn [naproxen], and  Other   History: Past Medical History:  Diagnosis Date  . Anxiety   . Arthritis    hands, upper back  . Asthma   . COPD (chronic obstructive pulmonary disease) ([place])   . History of cervical cancer   . Menopausal disorder   . Osteoporosis   . Pneumonia 1960  . Spasm of abdominal muscles of right side    intermittent  . TMJ (dislocation of temporomandibular joint)   . Wears dentures    partial lower   Past Surgical History:  Procedure Laterality Date  . ABDOMINAL HYSTERECTOMY  1970's  . bladder botox  2005  . BLADDER SUSPENSION  2004  . CATARACT EXTRACTION W/PHACO Right 03/09/2018   Procedure: CATARACT EXTRACTION PHACO AND INTRAOCULAR LENS PLACEMENT ([place]) right;  Surgeon: Eulogio Bear, MD;  Location: [place];  Service: Ophthalmology;  Laterality: Right;  CALL CELL 1ST  . CATARACT EXTRACTION W/PHACO Left 04/19/2018   Procedure: CATARACT EXTRACTION PHACO AND INTRAOCULAR LENS PLACEMENT ([facility])  LEFT;  Surgeon: Eulogio Bear, MD;  Location: [place];  Service: Ophthalmology;  Laterality: Left;  . COLONOSCOPY WITH PROPOFOL N/A 12/13/2015   Procedure: COLONOSCOPY WITH PROPOFOL;  Surgeon: Lucilla Lame, MD;  Location: [place];  Service: Endoscopy;  Laterality: N/A;  . LOOP RECORDER INSERTION N/A 01/07/2018   Procedure: LOOP RECORDER INSERTION;  Surgeon: Deboraha Sprang, MD;  Location: [place] [facility];  Service: Cardiovascular;  Laterality: N/A;  . POLYPECTOMY N/A 12/13/2015   Procedure: POLYPECTOMY;  Surgeon: Lucilla Lame, MD;  Location: [place];  Service: Endoscopy;  Laterality: N/A;  SIGMOID COLON POLYPS X  5  . SHOULDER ARTHROSCOPY W/ ROTATOR CUFF REPAIR Right 1998  . TEE WITHOUT CARDIOVERSION N/A 01/06/2018   Procedure: TRANSESOPHAGEAL ECHOCARDIOGRAM (TEE);  Surgeon: Minna Merritts, MD;  Location: [facility];  Service: Cardiovascular;  Laterality: N/A;  . TONSILLECTOMY AND ADENOIDECTOMY     Family History  Problem Relation Age of  Onset  . Diabetes Mother   . Heart disease Mother   . Stroke Mother   . Stroke Maternal Grandmother    Social History   Socioeconomic History  . Marital status: Married    Spouse name: Yamilee Harmes  . Number of children: 1  . Years of education: Not on file  . Highest education level: Some college, no degree  Occupational History  . Occupation: retired  Tobacco Use  . Smoking status: Former Smoker    Years: 56.00    Types: Cigarettes    Quit date: 07/14/2016    Years since quitting: 3.7  . Smokeless tobacco: Never Used  Vaping Use  . Vaping Use: Former  Substance and Sexual Activity  . Alcohol use: Yes    Alcohol/week: 0.0 standard drinks  . Drug use: No  . Sexual activity: Not Currently    Birth control/protection: Post-menopausal  Other Topics Concern  . 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 Difficulty of Paying Living Expenses: Not hard at all  Food Insecurity: No Food Insecurity  . Worried About  Running Out of Food in the Last Year: Never true  . Ran Out of Food in the Last Year: Never true  Transportation Needs: No Transportation Needs  . Lack of Transportation (Medical): No  . Lack of Transportation (Non-Medical): No  Physical Activity: Inactive  . Days of Exercise per Week: 0 days  . Minutes of Exercise per Session: 0 min  Stress: No Stress Concern Present  . Feeling of Stress : Not at all  Social Connections:   . Frequency of Communication with Friends and Family:   . Frequency of Social Gatherings with Friends and Family:   . Attends Religious Services:   . Active Member of Clubs or Organizations:   . Attends Archivist Meetings:   Marland Kitchen Marital Status:     Tobacco Counseling Counseling given: Not Answered   Clinical Intake:  Pre-visit preparation completed: Yes  Pain : 0-10 Pain Score: 7  Pain Type: Chronic pain Pain  Location: Leg Pain Orientation: Right, Left Pain Radiating Towards: to feet Pain Descriptors / Indicators: Aching, Burning Pain Onset: More than a month ago Pain Frequency: Intermittent Pain Relieving Factors: lymphedema treatments work, has been needing to take APAP to sleep  Pain Relieving Factors: lymphedema treatments work, has been needing to take APAP to sleep  Nutritional Status: BMI > 30  Obese Nutritional Risks: None Diabetes: No  How often do you need to have someone help you when you read instructions, pamphlets, or other written materials from your doctor or pharmacy?: 1 - Never What is the last grade level you completed in school?: college courses  Diabetic? no  Interpreter Needed?: No  Information entered by :: NAllen LPN   Activities of Daily Living In your present state of health, do you have any difficulty performing the following activities: 04/09/2020  Hearing? N  Vision? N  Difficulty concentrating or making decisions? N  Walking or climbing stairs? Y  Comment lymphedema  Dressing or bathing? Y  Comment husband supervises  Doing errands, shopping? Y  Comment Always have someone with her  Preparing Food and eating ? N  Using the Toilet? N  In the past six months, have you accidently leaked urine? Y  Comment has bladder problems, wears pads  Do you have problems with loss of bowel control? N  Managing your Medications? N  Housekeeping or managing your Housekeeping? N  Some recent data might be hidden    Patient Care Team: [name], NP as PCP - General (Nurse Practitioner) Robert Bellow, MD as Consulting Physician (General Surgery) Kathrine Haddock, NP as Nurse Practitioner (Nurse Practitioner) Minna Merritts, MD as Consulting Physician (Cardiology) Greg Cutter, LCSW as Social Worker (Licensed Clinical Social Worker) De Hollingshead, [facility] as Pharmacist (Pharmacist) Minor, Dalbert Garnet, RN (Inactive) as [place] any recent [place] you may have received from other than [facility] providers in the past year (date may be approximate).     Assessment:   This is a routine wellness examination for [facility].  Hearing/Vision screen  Hearing Screening   125Hz  250Hz  500Hz  1000Hz  2000Hz  3000Hz  4000Hz  6000Hz  8000Hz   Right ear:           Left ear:           Vision Screening Comments: Regular eye exams, [facility], Dr. Edison Pace  Dietary issues and exercise activities discussed: Current Exercise Habits: The patient does not participate in regular exercise at present  Goals    .  "  I don't have a lot of education on resources available to me and my spouse" (pt-stated)      Current Barriers:  . Limited education about Caregiver Support Resources* . Limited access to caregiver . Inability to perform IADL's independently Patient reports that she and spouse share and assist one another with performance of IADL's.   Clinical Social Work Clinical Goal(s):  Marland Kitchen Over the next 90 days, client will work with SW to address concerns related to lack of caregiver support within the home.  Interventions: . Patient interviewed and appropriate assessments performed . Positive reinforcement provided as patient has successfully implemented appropriate self-care into her daily routine. Patient has several hobbies that she works on to combat boredom.  . Implemented talk therapy during session to support patient and her needs around caregiver support. Patient reports that she had pneumonia in her right lung which has caused her a lot of anxiety and stress. LCSW provided education on deep breathing and relaxation techniques to implement into their daily routine to combat stressors.  . Provided education on available personal care service resources within the area  . Discussed plans with patient for ongoing care management follow up and provided patient with direct contact information for care management  team . Provided education on level of care, senior centers, adult day programs, VA benefits and assistance in terms of in home care and placement, LTC and In [employer].   Patient Self Care Activities:  . Attends all scheduled provider appointments . Calls provider office for new concerns or questions  Plan:  . LCSW willfollow up within 30 days and reassess social work needs  Please see past updates related to this goal by clicking on the "Past Updates" button in the selected goal     .  DIET - INCREASE WATER INTAKE      Recommend continue drinking at least 6-8 glasses of water a day     .  Patient Stated      04/09/2020, wants to walk better    .  PharmD "My leg is still not completely healed" (pt-stated)      CARE PLAN ENTRY (see longtitudinal plan of care for additional care plan information)  Current Barriers:  . Polypharmacy; complex patient with multiple comorbidities including chronic venous insufficiency, COPD, CAD, hx CVA, with recent cellulitis requiring multiple rounds of antibiotics o Notes that she has been "feeling horrible", difficult to discern for how long, but has occasions of getting dizzy, feels like BP drops, drinks water. Biggest concern lately has been hot flashes and joints/muscle ache, in her arms and legs, but also in her hands that makes it more difficult to crochet and garden. Reports that she was mentioning this to lymphedema therapist yesterday, and she recommended that she discuss atorvastatin with her providers and if it could be related.  . Chronic medical problems: o ASCVD (hx CVA); clopidogrel 75 mg daily; atorvastatin 40 mg daily (last LDL 70);  o COPD/Allergies: Symbicort 160/4.5 mcg 2 puffs BID + Albuterol PRN; montelukast 10 mg daily, loratadine 10 mg daily; wonders if the ICS in Symbicort is contributing to her weight gain.  o Depression: citalopram 20 mg daily PRN o Post-menopausal symptoms: estradiol 0.1 mg patch twice weekly o GERD:  pantoprazole 40 mg daily  Pharmacist Clinical Goal(s):  Marland Kitchen Over the next 90 days, patient will work with PharmD and provider towards optimized medication management   Interventions: . Inter-disciplinary care team collaboration (see longitudinal plan of care) . Extensive discussion  of statin related muscle symptoms. Discussed that sweating and "BP drops" are very unlikely to be related to statin therapy given mechanism of action, and that pain/discomfort in her hands is more likely related to arthritis. Discussed that typical statin associated muscle symptoms have an onset within a few weeks of starting medication, are bilateral in larger muscle groups, and resolve upon discontinuation. Discussed importance of maintaining statin therapy given hx CVA. Discussed that d/t structure, rosuvastatin may have lower incidence of muscle symptoms. Recommended she schedule appointment w/ PCP to discuss these concerns and formulate plan. Could consider d/c atorvastatin w/ 1-2 week washout, then restart rosuvastatin 10 mg daily.  . Patient asked if there are blood tests to test for arthritis. Discussed rheumatoid vs OA. Encouraged to schedule appointment w/ PCP to discuss concerns and appropriate treatment.  Patient Self Care Activities:  . Patient will take medications as prescribed  Please see past updates related to this goal by clicking on the "Past Updates" button in the selected goal      .  RN- I need to take care of my edema (pt-stated)      Current Barriers:  . Chronic Disease Management support and education needs related to lymph edema  Nurse Case Manager Clinical Goal(s):  Marland Kitchen Over the next 90 days, patient will work with [facility] to address needs related to lymph edema  Interventions:  . Evaluation of current treatment plan related to Bilateral lower extremity edema and patient's adherence to plan as established by provider. . Advised patient to allow herself to go get lymph edema massages as  recommended by her vein & vascular specialist. When her cellulitis has cleared.   . Discussed plans with patient for ongoing care management follow up and provided patient with direct contact information for care management team . Reviewed scheduled/upcoming provider appointments including:  . 1/12 for legs to be wrapped. Patient expresses between she and her spouse they have lots of upcoming appointments. . Patient using lymph edema pumps q day for 1 hour as directed. . Patient unable to exercise much related to leg wraps and discomfort.  . Discussed with patient her aspirin and Plavix. Patient stated she was started on both after her stroke by cardiologist. Requested CCM pharm D to look into this further.  . Patient using pure coconut oil as a lotion and stated it has been very effective not to cause her skin to be red or irritated.    Patient Self Care Activities:  . Performs ADL's independently . Performs IADL's independently . Does not adhere to provider recommendations re: to obtain lymph edema massages bi-weekly related to time and financial constraints. Patient plans to work on this as a goal.   Please see past updates related to this goal by clicking on the "Past Updates" button in the selected goal        Depression Screen PHQ 2/9 Scores 04/09/2020 06/28/2019 04/07/2019 12/21/2018 12/07/2018 01/14/2018 11/25/2017  PHQ - 2 Score 0 2 1 0 0 2 1  PHQ- 9 Score - 3 - 0 0 4 2    Fall Risk Fall Risk  04/09/2020 06/28/2019 04/07/2019 02/17/2019 12/07/2018  Falls in the past year? 0 1 1 1 1   Number falls in past yr: - 0 0 0 0  Injury with Fall? - 0 1 1 1   Risk for fall due to : Impaired mobility;Medication side effect History of fall(s) Impaired balance/gait History of fall(s) -  Follow up Falls evaluation completed;Education provided;Falls prevention discussed Falls  prevention discussed;Education provided - Falls evaluation completed -    Any stairs in or around the home? Yes  If so, are there  any without handrails? Yes  Home free of loose throw rugs in walkways, pet beds, electrical cords, etc? Yes  Adequate lighting in your home to reduce risk of falls? Yes   ASSISTIVE DEVICES UTILIZED TO PREVENT FALLS:  Life alert? No  Use of a cane, walker or w/c? Yes  Grab bars in the bathroom? No  Shower chair or bench in shower? Yes  Elevated toilet seat or a handicapped toilet? Yes   TIMED UP AND GO:  Was the test performed? No .     Cognitive Function:     6CIT Screen 04/09/2020 01/14/2018  What Year? 0 points 0 points  What month? 0 points 0 points  What time? 0 points 0 points  Count back from 20 0 points 0 points  Months in reverse 0 points 0 points  Repeat phrase 0 points 0 points  Total Score 0 0    Immunizations Immunization History  Administered Date(s) Administered  . Fluad Quad(high Dose 65+) 07/21/2019  . Influenza, High Dose Seasonal PF 06/17/2018  . Influenza-Unspecified 08/09/2015, 06/18/2016, 07/17/2017  . PFIZER SARS-COV-2 Vaccination 11/27/2019, 12/27/2019  . Pneumococcal Conjugate-13 09/25/2014  . Pneumococcal-Unspecified 04/07/2012  . Td 09/25/2014    TDAP status: Up to date Flu Vaccine status: Up to date Pneumococcal vaccine status: Up to date Covid-19 vaccine status: Completed vaccines  Qualifies for Shingles Vaccine? Yes   Zostavax completed No   Shingrix Completed?: No.    Education has been provided regarding the importance of this vaccine. Patient has been advised to call insurance company to determine out of pocket expense if they have not yet received this vaccine. Advised may also receive vaccine at local pharmacy or Health Dept. Verbalized acceptance and understanding.  Screening Tests Health Maintenance  Topic Date Due  . INFLUENZA VACCINE  05/13/2020  . COLONOSCOPY  12/12/2020  . MAMMOGRAM  07/04/2021  . TETANUS/TDAP  09/25/2024  . DEXA SCAN  Completed  . COVID-19 Vaccine  Completed  . Hepatitis C Screening  Completed  . PNA  vac Low Risk Adult  Completed    Health Maintenance  There are no preventive care reminders to display for this patient.  Colorectal cancer screening: Completed 12/13/2015. Repeat every 5 years Mammogram status: Completed 07/05/2019. Repeat every year Bone Density status: Completed 08/05/2017.   Lung Cancer Screening: (Low Dose CT Chest recommended if Age 26-80 years, 30 pack-year currently smoking OR have quit w/in 15years.) does not qualify.   Lung Cancer Screening Referral: no  Additional Screening:  Hepatitis C Screening: does qualify; Completed 10/17/2015  Vision Screening: Recommended annual ophthalmology exams for early detection of glaucoma and other disorders of the eye. Is the patient up to date with their annual eye exam?  Yes  Who is the provider or what is the name of the office in which the patient attends annual eye exams? Dr. Edison Pace If pt is not established with a provider, would they like to be referred to a provider to establish care? No .   Dental Screening: Recommended annual dental exams for proper oral hygiene  Community Resource Referral / Chronic Care Management: CRR required this visit?  No   CCM required this visit?  No      Plan:     I have personally reviewed and noted the following in the patient's chart:   . Medical and social  history . Use of alcohol, tobacco or illicit drugs  . Current medications and supplements . Functional ability and status . Nutritional status . Physical activity . Advanced directives . List of other physicians . Hospitalizations, surgeries, and ER visits in previous 12 months . Vitals . Screenings to include cognitive, depression, and falls . Referrals and appointments  In addition, I have reviewed and discussed with patient certain preventive protocols, quality metrics, and best practice recommendations. A written personalized care plan for preventive services as well as general preventive health recommendations were  provided to patient.     Kellie Simmering, LPN   04/17/5825   Nurse Notes:

## 2020-04-10 NOTE — Progress Notes (Signed)
Carelink Summary Report / Loop Recorder 

## 2020-04-11 ENCOUNTER — Other Ambulatory Visit: Payer: Self-pay

## 2020-04-11 ENCOUNTER — Ambulatory Visit: Payer: Medicare Other | Admitting: Occupational Therapy

## 2020-04-11 DIAGNOSIS — I890 Lymphedema, not elsewhere classified: Secondary | ICD-10-CM | POA: Diagnosis not present

## 2020-04-11 NOTE — Therapy (Signed)
[place] MAIN [facility] SERVICES [address] [place], [name], [address] Phone: [phone]   Fax:  [phone]  Occupational Therapy Treatment Note and Progress Report: Lymphedema Care  Patient Details  Name: Sharon [name] MRN: [id number] Date of Birth: 05/30/1946 Referring Provider (OT): Eulogio Ditch, NP   Encounter Date: 04/11/2020   OT End of Session - 04/11/20 1627    Visit Number 30    Number of Visits 36    Date for OT Re-Evaluation 06/19/20    OT Start Time 0200    OT Stop Time 0300    OT Time Calculation (min) 60 min    Activity Tolerance Patient tolerated treatment well;No increased pain    Behavior During Therapy WFL for tasks assessed/performed           Past Medical History:  Diagnosis Date  . Anxiety   . Arthritis    hands, upper back  . Asthma   . COPD (chronic obstructive pulmonary disease) ([place])   . History of cervical cancer   . Menopausal disorder   . Osteoporosis   . Pneumonia 1960  . Spasm of abdominal muscles of right side    intermittent  . TMJ (dislocation of temporomandibular joint)   . Wears dentures    partial lower    Past Surgical History:  Procedure Laterality Date  . ABDOMINAL HYSTERECTOMY  1970's  . bladder botox  2005  . BLADDER SUSPENSION  2004  . CATARACT EXTRACTION W/PHACO Right 03/09/2018   Procedure: CATARACT EXTRACTION PHACO AND INTRAOCULAR LENS PLACEMENT ([place]) right;  Surgeon: Eulogio Bear, MD;  Location: [place];  Service: Ophthalmology;  Laterality: Right;  CALL CELL 1ST  . CATARACT EXTRACTION W/PHACO Left 04/19/2018   Procedure: CATARACT EXTRACTION PHACO AND INTRAOCULAR LENS PLACEMENT ([facility])  LEFT;  Surgeon: Eulogio Bear, MD;  Location: [place];  Service: Ophthalmology;  Laterality: Left;  . COLONOSCOPY WITH PROPOFOL N/A 12/13/2015   Procedure: COLONOSCOPY WITH PROPOFOL;  Surgeon: Lucilla Lame, MD;  Location: [place];  Service: Endoscopy;   Laterality: N/A;  . LOOP RECORDER INSERTION N/A 01/07/2018   Procedure: LOOP RECORDER INSERTION;  Surgeon: Deboraha Sprang, MD;  Location: [place] CV [facility];  Service: Cardiovascular;  Laterality: N/A;  . POLYPECTOMY N/A 12/13/2015   Procedure: POLYPECTOMY;  Surgeon: Lucilla Lame, MD;  Location: [place];  Service: Endoscopy;  Laterality: N/A;  SIGMOID COLON POLYPS X  5  . SHOULDER ARTHROSCOPY W/ ROTATOR CUFF REPAIR Right 1998  . TEE WITHOUT CARDIOVERSION N/A 01/06/2018   Procedure: TRANSESOPHAGEAL ECHOCARDIOGRAM (TEE);  Surgeon: Minna Merritts, MD;  Location: [facility];  Service: Cardiovascular;  Laterality: N/A;  . TONSILLECTOMY AND ADENOIDECTOMY      There were no vitals filed for this visit.   Subjective Assessment - 04/11/20 1406    Subjective  Sharon [name] for OT visit 28 of 36 to address BLE Lymphedema. Pt expresses concern about OT visits coming to an end in the near future. "This really helps my legs. It willl help reduce the pain and swelling until about Saturday."    Pertinent History Hx anxiety, COPD, OA, Asthma, hx cervical ca, osteoporosis, hx pneumonia, hx CVA, hx LLE cellulitis, hx L leg wound, hx falls    Limitations chronic BLE leg swelling and associated pain, decreased balance, fall risk, decreased standing, walking and sitting tolerance, difficulty w/ lower body bathing and dressing, difficulty fitting LB clothing and shoes, unable to drive, impaired transfers  and functional mobility, impaired social participation, impaired ability to perform household chores, cooking, meal prep    Pain Onset More than a month ago                        OT Treatments/Exercises (OP) - 04/11/20 0001      ADLs   ADL Education Given Yes      Manual Therapy   Manual Therapy Edema management    Manual therapy comments skin care w/ low ph castor oil throughout MLD to increase skin hydration and tissue mobuility/flexibility.    Manual Lymphatic Drainage (MLD)  MLD to RLE as established. Added fibrosis technique and myofacial release at ankle and distal leg to increase tissue flexibility    Compression Bandaging Pt  dons compression stocking after session.                       OT Long Term Goals - 04/11/20 1628      OT LONG TERM GOAL #1   Title Pt will demonstrate understanding of lymphedema (LE) precautions / prevention principals, including signs / symptoms of cellulitis infection with modified independence using LE Workbook as printed reference to identify 6 precautions without verbal cues by end of 3rd  OT Rx visit.     Time 3    Period Days    Status Achieved      OT LONG TERM GOAL #2   Title Pt will be able todon and doff existing compression garments independently and without assistive devices and/ or extra time for optimal lymphedema self management over time and to limit infection risk.    Baseline supervision    Time 4    Period Days    Status Achieved      OT LONG TERM GOAL #3   Title Pt to achieve at least  10% limb volume reduction in affected limb(s)  bilaterally during Intensive Phase CDT to control limb swelling, to improve tissue integrity and immune function, to improve ADLs performance and to improve functional mobility/ transfer, and to improve body image and self-esteem.    Baseline max A    Time 8    Period Weeks    Status On-going   decreased 1.05% on 02/15/20     OT LONG TERM GOAL #4   Title Pt will achieve 100% compliance with daily LE self-care home program components, including daily  skin care, simple self-MLD, gradient compression wraps/ compression garments/devices, and therapeutic exercise with Mod CG support to ensure optimal Intensive Phase limb volume reduction to expedite compression garment/ device fitting.    Baseline 50% compliant    Time 12    Period Weeks    Status Achieved      OT LONG TERM GOAL #5   Title Pt will use appropriate  assistive devices during LE self-care training to don  compression garments/devices with modified independence to ensure optimal LE self-management over time and to limit progression of chronic LE.    Baseline min A    Time 12    Period Weeks    Status Achieved   DC goal     OT LONG TERM GOAL #6   Title Pt will retain optimal limb volume reductions achieved during Intensive Phase CDT with no more than 3% volume increase with ongoing CG assistance to limit LE progression and further functional decline.    Baseline min A    Time 6    Period Months  Status On-going                 Plan - 04/11/20 1630    Clinical Impression Statement Pttolerated RLE MLD today witout increased pain. We discussed progress towards all goals todate and plan of care going forward . Pt has met all current OT goals for LE care, and longer term  management goals are ongoing. Limb volume reduction and control, improved skin condition, and compliance with self-care home program cmponents have reached a clinical plateau. By report pain decreases for 3-4 days after manual therapy, then increases again gradually over the next few days until next session.. Pt is reluctant to stop LE Rx due to ongoing analgesic effect and concern re recurrent infection. Pt is in agreement with plan to increase compliance with sequential  pneumatic device while continuing daily routine of skin care, ther ex , and compression over the next 3-4 weeks, then return for follow up assessment to check status of LE self-management and pain.           Patient will benefit from skilled therapeutic intervention in order to improve the following deficits and impairments:           Visit Diagnosis: Lymphedema, not elsewhere classified    Problem List Patient Active Problem List   Diagnosis Date Noted  . Hyperlipidemia 02/14/2020  . Obesity 02/14/2020  . Vitamin D deficiency 06/28/2019  . Prediabetes 06/28/2019  . GERD (gastroesophageal reflux disease) 12/07/2018  . Vitamin B12  deficiency 12/05/2018  . Senile purpura ([place]) 07/28/2018  . Chronic venous insufficiency 07/14/2018  . Lymphedema 07/14/2018  . Aneurysm of descending thoracic aorta (H[facility]) 05/04/2018  . Allergic rhinitis 02/16/2018  . Aortic atherosclerosis ([place]) 01/13/2018  . Status post placement of implantable loop recorder 01/12/2018  . Chronic kidney disease, stage 3 01/12/2018  . History of CVA (cerebrovascular accident)   . TIA (transient ischemic attack) 01/04/2018  . CAD (coronary artery disease) 11/25/2017  . Caregiver stress 11/25/2017  . Menopause 08/14/2017  . De Quervain's tenosynovitis, right 07/17/2017  . Stress incontinence 06/19/2017  . Personal history of tobacco use, presenting hazards to health 04/01/2017  . Anxiety 03/13/2017  . Advanced care planning/counseling discussion 10/17/2016  . Benign neoplasm of sigmoid colon   . Centrilobular emphysema ([place]) 10/17/2015  . Mass of upper inner quadrant of right breast 06/22/2015    Andrey Spearman, MS, OTR/L, [facility] 04/11/20 4:38 PM  [place] MAIN [facility] SERVICES [address] [place], [name], [address] Phone: [phone]   Fax:  [phone]  Name: Sharon [name] MRN: [id number] Date of Birth: [birth year]

## 2020-04-12 ENCOUNTER — Ambulatory Visit: Payer: Medicare Other

## 2020-04-17 ENCOUNTER — Other Ambulatory Visit: Payer: Self-pay

## 2020-04-17 ENCOUNTER — Ambulatory Visit (INDEPENDENT_AMBULATORY_CARE_PROVIDER_SITE_OTHER): Payer: Medicare Other

## 2020-04-17 DIAGNOSIS — K140 Glossitis: Secondary | ICD-10-CM

## 2020-04-17 MED ORDER — CYANOCOBALAMIN 1000 MCG/ML IJ SOLN
1000.0000 ug | Freq: Once | INTRAMUSCULAR | Status: AC
  Administered 2020-04-17: 1000 ug via INTRAMUSCULAR

## 2020-04-18 ENCOUNTER — Telehealth: Payer: Self-pay

## 2020-04-18 ENCOUNTER — Ambulatory Visit: Payer: Self-pay | Admitting: Pharmacist

## 2020-04-18 NOTE — Chronic Care Management (AMB) (Signed)
  Chronic Care Management   Note  04/18/2020 Name: [name] MRN: [id number] DOB: 1946/02/02  [name] is a 74 y.o. year old female who is a primary care patient of [name], [name], NP. The CCM team was consulted for assistance with chronic disease management and care coordination needs.    Attempted to contact patient for medication management review. Left HIPAA compliant message for patient to return my call at their convenience.   Plan: - Will collaborate with Care Guide to outreach to schedule follow up with me  Catie Darnelle Maffucci, PharmD, [place] [phone]

## 2020-04-19 ENCOUNTER — Telehealth: Payer: Self-pay | Admitting: Nurse Practitioner

## 2020-04-19 NOTE — Chronic Care Management (AMB) (Signed)
°  Care Management   Note  04/19/2020 Name: Sharon [name] MRN: [id number] DOB: 08-17-1946  [name] is a 74 y.o. year old female who is a primary care patient of [name], NP and is actively engaged with the care management team. I reached out to [name] by phone today to assist with re-scheduling a follow up visit with the Pharmacist  Follow up plan: Telephone appointment with care management team member scheduled for:06/01/2020  Noreene Larsson, [place], [place], [place] [address] Direct Dial: [phone] [email or web address]

## 2020-04-19 NOTE — Chronic Care Management (AMB) (Signed)
  Care Management   Note  04/19/2020 Name: [name] MRN: [id number] DOB: Jul 29, 1946  Sharon [name] is a 74 y.o. year old female who is a primary care patient of [name], NP and is actively engaged with the care management team. I reached out to Sharon [name] by phone today to assist with re-scheduling a follow up visit with the Pharmacist  Follow up plan: Unsuccessful telephone outreach attempt made. A HIPPA compliant phone message was left for the patient providing contact information and requesting a return call.  The care management team will reach out to the patient again over the next 7 days.  If patient returns call to provider office, please advise to call [place]  at [place], [place], [place], [place] [address] Direct Dial: [phone] [email or web address]

## 2020-04-27 ENCOUNTER — Telehealth: Payer: Self-pay

## 2020-05-14 ENCOUNTER — Ambulatory Visit (INDEPENDENT_AMBULATORY_CARE_PROVIDER_SITE_OTHER): Payer: Medicare Other | Admitting: *Deleted

## 2020-05-14 DIAGNOSIS — G459 Transient cerebral ischemic attack, unspecified: Secondary | ICD-10-CM | POA: Diagnosis not present

## 2020-05-15 ENCOUNTER — Telehealth: Payer: Self-pay

## 2020-05-15 NOTE — Telephone Encounter (Signed)
LMOVM for pt to stop sending manual transmissions. 

## 2020-05-16 LAB — CUP PACEART REMOTE DEVICE CHECK
Date Time Interrogation Session: 20210730233312
Implantable Pulse Generator Implant Date: 20190328

## 2020-05-17 ENCOUNTER — Ambulatory Visit (INDEPENDENT_AMBULATORY_CARE_PROVIDER_SITE_OTHER): Payer: Medicare Other

## 2020-05-17 ENCOUNTER — Other Ambulatory Visit: Payer: Self-pay

## 2020-05-17 DIAGNOSIS — K140 Glossitis: Secondary | ICD-10-CM

## 2020-05-17 MED ORDER — CYANOCOBALAMIN 1000 MCG/ML IJ SOLN
1000.0000 ug | Freq: Once | INTRAMUSCULAR | Status: AC
  Administered 2020-05-17: 1000 ug via INTRAMUSCULAR

## 2020-05-17 NOTE — Progress Notes (Signed)
Carelink Summary Report / Loop Recorder 

## 2020-05-19 ENCOUNTER — Other Ambulatory Visit: Payer: Self-pay | Admitting: Nurse Practitioner

## 2020-05-23 ENCOUNTER — Other Ambulatory Visit: Payer: Self-pay | Admitting: Nurse Practitioner

## 2020-05-23 ENCOUNTER — Ambulatory Visit: Payer: Medicare Other | Attending: Nurse Practitioner | Admitting: Occupational Therapy

## 2020-05-23 ENCOUNTER — Other Ambulatory Visit: Payer: Self-pay

## 2020-05-23 DIAGNOSIS — I890 Lymphedema, not elsewhere classified: Secondary | ICD-10-CM | POA: Insufficient documentation

## 2020-05-24 ENCOUNTER — Encounter (INDEPENDENT_AMBULATORY_CARE_PROVIDER_SITE_OTHER): Payer: Self-pay

## 2020-05-24 ENCOUNTER — Ambulatory Visit (INDEPENDENT_AMBULATORY_CARE_PROVIDER_SITE_OTHER): Payer: Medicare Other | Admitting: Unknown Physician Specialty

## 2020-05-24 ENCOUNTER — Encounter: Payer: Self-pay | Admitting: Unknown Physician Specialty

## 2020-05-24 ENCOUNTER — Other Ambulatory Visit: Payer: Self-pay

## 2020-05-24 DIAGNOSIS — I890 Lymphedema, not elsewhere classified: Secondary | ICD-10-CM

## 2020-05-24 DIAGNOSIS — G5793 Unspecified mononeuropathy of bilateral lower limbs: Secondary | ICD-10-CM | POA: Insufficient documentation

## 2020-05-24 MED ORDER — DOXYCYCLINE HYCLATE 100 MG PO TABS: 100.0000 mg | ORAL_TABLET | Freq: Two times a day (BID) | ORAL | 0 refills | Status: DC

## 2020-05-24 NOTE — Patient Instructions (Addendum)
For neuropathy try   L acetyl carnine Evening Primrose oil Alpha lipolic acid   For probiotic try Culturelle.

## 2020-05-24 NOTE — Assessment & Plan Note (Addendum)
Bilateral foot neuropathy more on lateral parts of feet.  Gets toes manicured.  Consider evening primrose oil or ALA.

## 2020-05-24 NOTE — Progress Notes (Signed)
BP (!) 147/85 (BP Location: Left Arm, Cuff Size: Normal)   Pulse 87   Temp 98.3 F (36.8 C) (Oral)   Wt 221 lb (100.2 kg)   LMP  (LMP Unknown)   SpO2 98%   BMI 32.64 kg/m    Subjective:    Patient ID: Sharon [name], female    DOB: [birth year], 74 y.o.   MRN: [id number]  HPI: Sharon [name] is a 74 y.o. female  Chief Complaint  Patient presents with  . Leg Pain    pt states her legs (bilateral) have been swollen, red, and painful for the last few days    Pt with bilateral lower leg edema, redness and painful for the last few days.  Wears compression stockings most of the time.  Has a pump and lymphedema therapy helps.  Hx of right leg cellulitis in which vascular gives her PCN 250 mg Twice a day for prophylaxis.  Has been too hot to wear compression stockings out of the house for over an hour.  This happened yesterday.    Pt is stating she has some bilateral neuropathy in her feet.  This comes and goes.    Relevant past medical, surgical, family and social history reviewed and updated as indicated. Interim medical history since our last visit reviewed. Allergies and medications reviewed and updated.  Review of Systems  Per HPI unless specifically indicated above     Objective:    BP (!) 147/85 (BP Location: Left Arm, Cuff Size: Normal)   Pulse 87   Temp 98.3 F (36.8 C) (Oral)   Wt 221 lb (100.2 kg)   LMP  (LMP Unknown)   SpO2 98%   BMI 32.64 kg/m   Wt Readings from Last 3 Encounters:  05/24/20 221 lb (100.2 kg)  04/09/20 220 lb (99.8 kg)  02/14/20 220 lb (99.8 kg)    Physical Exam Constitutional:      General: She is not in acute distress.    Appearance: Normal appearance. She is well-developed.  HENT:     Head: Normocephalic and atraumatic.  Eyes:     General: Lids are normal. No scleral icterus.       Right eye: No discharge.        Left eye: No discharge.     Conjunctiva/sclera: Conjunctivae normal.  Cardiovascular:     Rate and Rhythm: Normal rate.   Pulmonary:     Effort: Pulmonary effort is normal.  Abdominal:     Palpations: There is no hepatomegaly or splenomegaly.  Musculoskeletal:        General: Normal range of motion.     Right lower leg: Swelling and tenderness present. 3+ Edema present.     Left lower leg: Swelling and tenderness present. 3+ Edema present.     Comments: General inflammation  Neurological:     Mental Status: She is alert and oriented to person, place, and time.  Psychiatric:        Behavior: Behavior normal.        Thought Content: Thought content normal.        Judgment: Judgment normal.     Results for orders placed or performed in visit on 05/14/20  CUP PACEART REMOTE DEVICE CHECK  Result Value Ref Range   Date Time Interrogation Session [id number]    Pulse Generator Manufacturer MERM    Pulse Gen Model [id number] Reveal LINQ    Pulse Gen Serial Number [id number]    Clinic Name [facility]  Implantable Pulse Generator Type ICM/ILR    Implantable Pulse Generator Implant Date [id number]       Assessment & Plan:   Problem List Items Addressed This Visit      Unprioritized   Lymphedema    Bilateral leg swelling, inflammation and tenderness.  This is a change over the last week.  Will change ab to Doxycycline 100 mg BID for 1 week.        Neuropathy of both feet    Bilateral foot neuropathy more on lateral parts of feet.  Gets toes manicured.  Consider evening primrose oil or ALA.             Follow up plan: Return if symptoms worsen or fail to improve.

## 2020-05-24 NOTE — Telephone Encounter (Signed)
Pt has apt with Wicker,[name] DNP on 05/24/20 at 3:40 pm.

## 2020-05-24 NOTE — Assessment & Plan Note (Signed)
Bilateral leg swelling, inflammation and tenderness.  This is a change over the last week.  Will change ab to Doxycycline 100 mg BID for 1 week.

## 2020-05-24 NOTE — Therapy (Signed)
[place] MAIN [facility] SERVICES [address] [place], [name], [address] Phone: [phone]   Fax:  [phone]  Occupational Therapy Treatment  Patient Details  Name: Sharon [name] MRN: [id number] Date of Birth: October 30, 1945 Referring Provider (OT): Eulogio Ditch, NP   Encounter Date: 05/23/2020   OT End of Session - 05/24/20 1315    Visit Number 31    Number of Visits 36    Date for OT Re-Evaluation 06/19/20    OT Start Time 0200    OT Stop Time 0300    OT Time Calculation (min) 60 min    Activity Tolerance Patient tolerated treatment well;No increased pain    Behavior During Therapy WFL for tasks assessed/performed           Past Medical History:  Diagnosis Date  . Anxiety   . Arthritis    hands, upper back  . Asthma   . COPD (chronic obstructive pulmonary disease) ([place])   . History of cervical cancer   . Menopausal disorder   . Osteoporosis   . Pneumonia 1960  . Spasm of abdominal muscles of right side    intermittent  . TMJ (dislocation of temporomandibular joint)   . Wears dentures    partial lower    Past Surgical History:  Procedure Laterality Date  . ABDOMINAL HYSTERECTOMY  1970's  . bladder botox  2005  . BLADDER SUSPENSION  2004  . CATARACT EXTRACTION W/PHACO Right 03/09/2018   Procedure: CATARACT EXTRACTION PHACO AND INTRAOCULAR LENS PLACEMENT ([place]) right;  Surgeon: Eulogio Bear, MD;  Location: [place];  Service: Ophthalmology;  Laterality: Right;  CALL CELL 1ST  . CATARACT EXTRACTION W/PHACO Left 04/19/2018   Procedure: CATARACT EXTRACTION PHACO AND INTRAOCULAR LENS PLACEMENT (IOC)  LEFT;  Surgeon: Eulogio Bear, MD;  Location: [place];  Service: Ophthalmology;  Laterality: Left;  . COLONOSCOPY WITH PROPOFOL N/A 12/13/2015   Procedure: COLONOSCOPY WITH PROPOFOL;  Surgeon: Lucilla Lame, MD;  Location: [place];  Service: Endoscopy;  Laterality: N/A;  . LOOP RECORDER INSERTION  N/A 01/07/2018   Procedure: LOOP RECORDER INSERTION;  Surgeon: Deboraha Sprang, MD;  Location: [place] [facility];  Service: Cardiovascular;  Laterality: N/A;  . POLYPECTOMY N/A 12/13/2015   Procedure: POLYPECTOMY;  Surgeon: Lucilla Lame, MD;  Location: [place];  Service: Endoscopy;  Laterality: N/A;  SIGMOID COLON POLYPS X  5  . SHOULDER ARTHROSCOPY W/ ROTATOR CUFF REPAIR Right 1998  . TEE WITHOUT CARDIOVERSION N/A 01/06/2018   Procedure: TRANSESOPHAGEAL ECHOCARDIOGRAM (TEE);  Surgeon: Minna Merritts, MD;  Location: [facility];  Service: Cardiovascular;  Laterality: N/A;  . TONSILLECTOMY AND ADENOIDECTOMY      There were no vitals filed for this visit.   Subjective Assessment - 05/23/20 1408    Subjective  Sharon [name] returns today for OT visit 34 of 17 to address BLE Lymphedema. Pt was last seen by OT for LE care on 04/04/20.Pt presents without compression garments in place due to hot summertime temperatures."I am not wearing stockings when it's 103 degrees outside. I wear my stockings at home and I never go anywhere unless it's to a doctor's appointment." Pt's reported goal for self-management phase of OT is to continue manual therapy in the clinic a little longer because it makes her legs feel much better.    Pertinent History Hx anxiety, COPD, OA, Asthma, hx cervical ca, osteoporosis, hx pneumonia, hx CVA, hx LLE cellulitis, hx L leg wound, hx  falls    Limitations chronic BLE leg swelling and associated pain, decreased balance, fall risk, decreased standing, walking and sitting tolerance, difficulty w/ lower body bathing and dressing, difficulty fitting LB clothing and shoes, unable to drive, impaired transfers and functional mobility, impaired social participation, impaired ability to perform household chores, cooking, meal prep    Pain Onset More than a month ago               LYMPHEDEMA/ONCOLOGY QUESTIONNAIRE - 05/24/20 0001      Right Lower Extremity Lymphedema   Other  RLE limb volume from ankle to tibial tuberosity = 3358.98    Other RLE A-D limb volume is decreased by 3.2% since last measured on 12/22/19.      Left Lower Extremity Lymphedema   Other LLE (Rx limb) A-D volume measures 3487.4 ml. LLE A-D limb volume is increased by 9.6% since 02/15/20.    Other LLE A-D limb volume is increased by 9.6% since 02/15/20.                           OT Education - 05/24/20 1311    Education Details Discussed transition from Intesive Phase to self-management phases of CDT with patient. Pt verbalized understanding that when she recently reached clinical plateau in progress towards goals then transition to self care was indicated  and skilled OT for CDT is no longer indicated. Pt disagrees with transition and requests additional clinical Rx. Pt verbalized understanding that compression garments are clinically neccesary during waking hours when active despite weater conditions for optimalLE management.    Person(s) Educated Patient    Methods Explanation;Demonstration    Comprehension Verbalized understanding;Returned demonstration               OT Long Term Goals - 04/11/20 1628      OT LONG TERM GOAL #1   Title Pt will demonstrate understanding of lymphedema (LE) precautions / prevention principals, including signs / symptoms of cellulitis infection with modified independence using LE Workbook as printed reference to identify 6 precautions without verbal cues by end of 3rd  OT Rx visit.     Time 3    Period Days    Status Achieved      OT LONG TERM GOAL #2   Title Pt will be able todon and doff existing compression garments independently and without assistive devices and/ or extra time for optimal lymphedema self management over time and to limit infection risk.    Baseline supervision    Time 4    Period Days    Status Achieved      OT LONG TERM GOAL #3   Title Pt to achieve at least  10% limb volume reduction in affected limb(s)   bilaterally during Intensive Phase CDT to control limb swelling, to improve tissue integrity and immune function, to improve ADLs performance and to improve functional mobility/ transfer, and to improve body image and self-esteem.    Baseline max A    Time 8    Period Weeks    Status On-going   decreased 1.05% on 02/15/20     OT LONG TERM GOAL #4   Title Pt will achieve 100% compliance with daily LE self-care home program components, including daily  skin care, simple self-MLD, gradient compression wraps/ compression garments/devices, and therapeutic exercise with Mod CG support to ensure optimal Intensive Phase limb volume reduction to expedite compression garment/ device fitting.    Baseline 50%  compliant    Time 12    Period Weeks    Status Achieved      OT LONG TERM GOAL #5   Title Pt will use appropriate  assistive devices during LE self-care training to don compression garments/devices with modified independence to ensure optimal LE self-management over time and to limit progression of chronic LE.    Baseline min A    Time 12    Period Weeks    Status Achieved   DC goal     OT LONG TERM GOAL #6   Title Pt will retain optimal limb volume reductions achieved during Intensive Phase CDT with no more than 3% volume increase with ongoing CG assistance to limit LE progression and further functional decline.    Baseline min A    Time 6    Period Months    Status On-going                 Plan - 05/24/20 1319    Clinical Impression Statement BLE comparative limb volumetrics reveals RLE is decreased in volume , and LLE is increased in volume since measurements last calculated. RLE A-D limb volume is decreased by 3.2% since last measured on 12/22/19. LLE A-D limb volume is increased by 9.6% since 02/15/20. By report Sharon [name] remains compliant with compression garments when in the home, but not when going out. Skin condition is WNL. Redness is unchanged and mild pitting is observed  distally, L>R. Pt agrees with plan to continue OT 1 x weekly for 4-6 weeks during extreme hot summer temperatures to limit cellulitis risk and limit LLE swelling. Pt agrees with wear compression garments to and from clinical visits. Once extreme weather abates we will reassess. If stable volumetricaslly we'll DC OT to self-management phase.Cont as per POC.           Patient will benefit from skilled therapeutic intervention in order to improve the following deficits and impairments:           Visit Diagnosis: Lymphedema, not elsewhere classified    Problem List Patient Active Problem List   Diagnosis Date Noted  . Hyperlipidemia 02/14/2020  . Obesity 02/14/2020  . Vitamin D deficiency 06/28/2019  . Prediabetes 06/28/2019  . GERD (gastroesophageal reflux disease) 12/07/2018  . Vitamin B12 deficiency 12/05/2018  . Senile purpura ([place]) 07/28/2018  . Chronic venous insufficiency 07/14/2018  . Lymphedema 07/14/2018  . Aneurysm of descending thoracic aorta (HCC) 05/04/2018  . Allergic rhinitis 02/16/2018  . Aortic atherosclerosis ([place]) 01/13/2018  . Status post placement of implantable loop recorder 01/12/2018  . Chronic kidney disease, stage 3 01/12/2018  . History of CVA (cerebrovascular accident)   . TIA (transient ischemic attack) 01/04/2018  . CAD (coronary artery disease) 11/25/2017  . Caregiver stress 11/25/2017  . Menopause 08/14/2017  . De Quervain's tenosynovitis, right 07/17/2017  . Stress incontinence 06/19/2017  . Personal history of tobacco use, presenting hazards to health 04/01/2017  . Anxiety 03/13/2017  . Advanced care planning/counseling discussion 10/17/2016  . Benign neoplasm of sigmoid colon   . Centrilobular emphysema ([place]) 10/17/2015  . Mass of upper inner quadrant of right breast 06/22/2015    Andrey Spearman, MS, OTR/L, [facility] 05/24/20 1:29 PM   [place] MAIN [facility] SERVICES [address]  [place], [name], [address] Phone: [phone]   Fax:  [phone]  Name: Sharon [name] MRN: [id number] Date of Birth: 1946-08-31

## 2020-05-25 ENCOUNTER — Encounter (INDEPENDENT_AMBULATORY_CARE_PROVIDER_SITE_OTHER): Payer: Self-pay

## 2020-05-31 ENCOUNTER — Encounter (INDEPENDENT_AMBULATORY_CARE_PROVIDER_SITE_OTHER): Payer: Self-pay

## 2020-05-31 ENCOUNTER — Encounter: Payer: Self-pay | Admitting: Unknown Physician Specialty

## 2020-05-31 NOTE — Telephone Encounter (Signed)
Let's bring her in next week or so no studies

## 2020-06-01 ENCOUNTER — Ambulatory Visit: Payer: Medicare Other | Admitting: Pharmacist

## 2020-06-01 DIAGNOSIS — I2510 Atherosclerotic heart disease of native coronary artery without angina pectoris: Secondary | ICD-10-CM

## 2020-06-01 DIAGNOSIS — I890 Lymphedema, not elsewhere classified: Secondary | ICD-10-CM

## 2020-06-01 NOTE — Chronic Care Management (AMB) (Signed)
Chronic Care Management   Follow Up Note   06/01/2020 Name: Sharon [name] MRN: [id number] DOB: 02-10-1946  Referred by: [name], NP Reason for referral : No chief complaint on file.   Sharon [name] is a 74 y.o. year old female who is a primary care patient of [name], [name], NP. The CCM team was consulted for assistance with chronic disease management and care coordination needs.    Review of patient status, including review of consultants reports, relevant laboratory and other test results, and collaboration with appropriate care team members and the patient's provider was performed as part of comprehensive patient evaluation and provision of chronic care management services.    SDOH (Social Determinants of Health) assessments performed: No See Care Plan activities for detailed interventions related to [facility])     Outpatient Encounter Medications as of 06/01/2020  Medication Sig Note   penicillin v potassium (VEETID) 250 MG tablet Take 1 tablet (250 mg total) by mouth 2 (two) times daily.    rosuvastatin (CRESTOR) 20 MG tablet Take 1 tablet (20 mg total) by mouth 3 (three) times a week. (Patient taking differently: Take 20 mg by mouth 3 (three) times a week. MWF)    budesonide-formoterol (SYMBICORT) 160-4.5 MCG/ACT inhaler USE 2 PUFFS BY MOUTH TWICE DAILY.    citalopram (CELEXA) 20 MG tablet Take 1 tablet (20 mg total) by mouth daily. 05/27/2019: As needed   clopidogrel (PLAVIX) 75 MG tablet TAKE 1 TABLET(75 MG) BY MOUTH DAILY    Cyanocobalamin 1000 MCG/ML KIT Inject 1,000 mcg as directed every 30 (thirty) days.    doxycycline (VIBRA-TABS) 100 MG tablet Take 1 tablet (100 mg total) by mouth 2 (two) times daily. (Patient not taking: Reported on 06/01/2020)    estradiol (VIVELLE-DOT) 0.1 MG/24HR patch APPLY 1 PATCH ONTO THE SKIN 2 TIMES A WEEK (Patient not taking: Reported on 04/09/2020)    pantoprazole (PROTONIX) 40 MG tablet Take 1 tablet (40 mg total) by mouth daily.      PROAIR HFA 108 (90 Base) MCG/ACT inhaler INHALE 2 PUFFS INTO THE LUNGS EVERY 6 HOURS AS NEEDED FOR WHEEZING OR SHORTNESS OF BREATH    No facility-administered encounter medications on file as of 06/01/2020.     Objective:   Goals Addressed              This Visit's Progress     PharmD "My leg is still not completely healed" (pt-stated)        CARE PLAN ENTRY (see longtitudinal plan of care for additional care plan information)  Current Barriers:   Polypharmacy; complex patient with multiple comorbidities including chronic venous insufficiency, COPD, CAD, hx CVA, with recent cellulitis requiring multiple rounds of antibiotics o Biggest concern lately has still  been  the increased pain/redness related to lymphedema. She has completed course of doxycycline and will restart Penicillin tonight until follow up with Eulogio Ditch next week. She has also scheduled weekly appointments with lymph edema clinic noting increased function and much less pain when getting regular manual manipulation. Notes  she feels much better after changing from atorvastatin to rosuvastatin and is not experiencing any of previous symptoms aside from arthritis-like pain and stiffness in her hands when crocheting. o Very concerned about her weight gain and elevated A1c. Attributes to being less active because due to left knee and hip pain.  Chronic medical problems: o ASCVD (hx CVA); clopidogrel 75 mg daily; rosuvastatin 34m three times weekly  (last LDL 70);  o COPD/Allergies:  Symbicort 160/4.5 mcg 2 puffs BID + Albuterol PRN; montelukast 10 mg daily, loratadine 10 mg daily; wonders if the ICS in Symbicort is contributing to her weight gain.  o Depression: citalopram 20 mg daily PRN o Post-menopausal symptoms: reports her insurance quit covering and she hasn't had since December. She reports still having symptoms. We discussed using [facility] [employer] or her [employer] insurance. Encouraged her to discuss at next appointment  with Jolene given her history of stroke. o GERD: pantoprazole 40 mg daily o Prediabetes: She reports son has T1DM but otherwise no family history. Controlled with diet and exercise.  Pharmacist Clinical Goal(s):   Over the next 90 days, patient will work with PharmD and provider towards optimized medication management   Interventions:  Inter-disciplinary care team collaboration (see longitudinal plan of care)  Extensive discussion of diet and high GI foods to avoid. She is very concerned by her recent weight loss and elevated A1C.  We went over her daily dietary habits and inability to be active or exercise. We discussed GLP-1 as a future possibility for A1c reduction, weight loss, and CVD benefit.  Discussed  pain in her hip and knee. Patient reports that she was scheduled for workup at [facility] but then had stroke. She will follow up with this. Also discussed physical therapy/water therapy to help her regain strength. She is interested in this and will discuss with Jolene. Her main concern is coordinating with her husbands MWF dialysis schedule.   Patient asked me to assess safety of evening primrose and ALA with her current medications. I did identify any significant interactions She  does report bruising with clopidogrel and I informed her there is a chance the evening primrose could increase these effects. We discussed signs of bleeding.  Patient Self Care Activities:   Patient will take medications as prescribed  Please see past updates related to this goal by clicking on the "Past Updates" button in the selected goal          Plan:   Telephone follow up appointment with care management team member scheduled for: 3 months   Junita Push. Kenton Kingfisher PharmD, [facility] [phone]

## 2020-06-01 NOTE — Patient Instructions (Addendum)
Visit Information  It was a pleasure speaking with you today. Thank you for letting me be part of your clinical team. Please call with any questions or concerns.   Goals Addressed              This Visit's Progress   .  PharmD "My leg is still not completely healed" (pt-stated)        CARE PLAN ENTRY (see longtitudinal plan of care for additional care plan information)  Current Barriers:  . Polypharmacy; complex patient with multiple comorbidities including chronic venous insufficiency, COPD, CAD, hx CVA, with recent cellulitis requiring multiple rounds of antibiotics o Biggest concern lately has still  been  the increased pain/redness related to lymphedema. She has completed course of doxycycline and will restart Penicillin tonight until follow up with Eulogio Ditch next week. She has also scheduled weekly appointments with lymph edema clinic noting increased function and much less pain when getting regular manual manipulation. Notes  she feels much better after changing from atorvastatin to rosuvastatin and is not experiencing any of previous symptoms aside from arthritis-like pain and stiffness in her hands when crocheting. o Very concerned about her weight gain and elevated A1c. Attributes to being less active because due to left knee and hip pain. . Chronic medical problems: o ASCVD (hx CVA); clopidogrel 75 mg daily; rosuvastatin 20mg  three times weekly  (last LDL 70);  o COPD/Allergies: Symbicort 160/4.5 mcg 2 puffs BID + Albuterol PRN; montelukast 10 mg daily, loratadine 10 mg daily; wonders if the ICS in Symbicort is contributing to her weight gain.  o Depression: citalopram 20 mg daily PRN o Post-menopausal symptoms: reports her insurance quit covering and she hasn't had since December. She reports still having symptoms. We discussed using [facility] or her [employer] insurance. Encouraged her to discuss at next appointment with Jolene given her history of stroke. o GERD: pantoprazole 40 mg  daily o Prediabetes: She reports son has T1DM but otherwise no family history. Controlled with diet and exercise.  Pharmacist Clinical Goal(s):  Marland Kitchen Over the next 90 days, patient will work with PharmD and provider towards optimized medication management   Interventions: . Inter-disciplinary care team collaboration (see longitudinal plan of care) . Extensive discussion of diet and high GI foods to avoid. She is very concerned by her recent weight loss and elevated A1C.  We went over her daily dietary habits and inability to be active or exercise. We discussed GLP-1 as a future possibility for A1c reduction, weight loss, and CVD benefit. . Discussed  pain in her hip and knee. Patient reports that she was scheduled for workup at [facility] but then had stroke. She will follow up with this. Also discussed physical therapy/water therapy to help her regain strength. She is interested in this and will discuss with Jolene. Her main concern is coordinating with her husbands MWF dialysis schedule.  . Patient asked me to assess safety of evening primrose and ALA with her current medications. I did identify any significant interactions She  does report bruising with clopidogrel and I informed her there is a chance the evening primrose could increase these effects. We discussed signs of bleeding.  Patient Self Care Activities:  . Patient will take medications as prescribed  Please see past updates related to this goal by clicking on the "Past Updates" button in the selected goal         The patient verbalized understanding of instructions provided today and agreed to receive a mailed  copy of patient instruction and/or educational materials.  Telephone follow up appointment with pharmacy team member scheduled for: 3 months  Junita Push. Kenton Kingfisher PharmD, BCPS Clinical Pharmacist [phone]  Lymphedema  Lymphedema is swelling that is caused by the abnormal collection of lymph in the tissues under the  skin. Lymph is fluid from the tissues in your body that is removed through the lymphatic system. This system is part of your body's defense system (immune system) and includes lymph nodes and lymph vessels. The lymph vessels collect and carry the excess fluid, fats, proteins, and wastes from the tissues of the body to the bloodstream. This system also works to clean and remove bacteria and waste products from the body. Lymphedema occurs when the lymphatic system is blocked. When the lymph vessels or lymph nodes are blocked or damaged, lymph does not drain properly. This causes an abnormal buildup of lymph, which leads to swelling in the affected area. This may include the trunk area, or an arm or leg. Lymphedema cannot be cured by medicines, but various methods can be used to help reduce the swelling. There are two types of lymphedema: primary lymphedema and secondary lymphedema. What are the causes? The cause of this condition depends on the type of lymphedema that you have.  Primary lymphedema is caused by the absence of lymph vessels or having abnormal lymph vessels at birth.  Secondary lymphedema occurs when lymph vessels are blocked or damaged. Secondary lymphedema is more common. Common causes of lymph vessel blockage include: ? Skin infection, such as cellulitis. ? Infection by parasites (filariasis). ? Injury. ? Radiation therapy. ? Cancer. ? Formation of scar tissue. ? Surgery. What are the signs or symptoms? Symptoms of this condition include:  Swelling of the arm or leg.  A heavy or tight feeling in the arm or leg.  Swelling of the feet, toes, or fingers. Shoes or rings may fit more tightly than before.  Redness of the skin over the affected area.  Limited movement of the affected limb.  Sensitivity to touch or discomfort in the affected limb. How is this diagnosed? This condition may be diagnosed based on:  Your symptoms and medical history.  A physical  exam.  Bioimpedance spectroscopy. In this test, painless electrical currents are used to measure fluid levels in your body.  Imaging tests, such as: ? Lymphoscintigraphy. In this test, a low dose of a radioactive substance is injected to trace the flow of lymph through the lymph vessels. ? MRI. ? CT scan. ? Duplex ultrasound. This test uses sound waves to produce images of the vessels and the blood flow on a screen. ? Lymphangiography. In this test, a contrast dye is injected into the lymph vessel to help show blockages. How is this treated? Treatment for this condition may depend on the cause of your lymphedema. Treatment may include:  Complete decongestive therapy (CDT). This is done by a certified lymphedema therapist to reduce fluid congestion. This therapy includes: ? Manual lymph drainage. This is a special massage technique that promotes lymph drainage out of a limb. ? Skin care. ? Compression wrapping of the affected area. ? Specific exercises. Certain exercises can help fluid move out of the affected limb.  Compression. Various methods may be used to apply pressure to the affected limb to reduce the swelling. They include: ? Wearing compression stockings or sleeves on the affected limb. ? Wrapping the affected limb with special bandages.  Surgery. This is usually done for severe cases only. For example,  surgery may be done if you have trouble moving the limb or if the swelling does not get better with other treatments. If an underlying condition is causing the lymphedema, treatment for that condition will be done. For example, antibiotic medicines may be used to treat an infection. Follow these instructions at home: Self-care  The affected area is more likely to become injured or infected. Take these steps to help prevent infection: ? Keep the affected area clean and dry. ? Use approved creams or lotions to keep the skin moisturized. ? Protect your skin from cuts:  Use  gloves while cooking or gardening.  Do not walk barefoot.  If you shave the affected area, use an Copy.  Do not wear tight clothes, shoes, or jewelry.  Eat a healthy diet that includes a lot of fruits and vegetables. Activity  Exercise regularly as directed by your health care provider.  Do not sit with your legs crossed.  When possible, keep the affected limb raised (elevated) above the level of your heart.  Avoid carrying things with an arm that is affected by lymphedema. General instructions  Wear compression stockings or sleeves as told by your health care provider.  Note any changes in size of the affected limb. You may be instructed to take regular measurements and keep track of them.  Take over-the-counter and prescription medicines only as told by your health care provider.  If you were prescribed an antibiotic medicine, take or apply it as told by your health care provider. Do not stop using the antibiotic even if you start to feel better.  Do not use heating pads or ice packs over the affected area.  Avoid having blood draws, IV insertions, or blood pressure checked on the affected limb.  Keep all follow-up visits as told by your health care provider. This is important. Contact a health care provider if you:  Continue to have swelling in your limb.  Have a cut that does not heal.  Have redness or pain in the affected area. Get help right away if you:  Have new swelling in your limb that comes on suddenly.  Develop purplish spots, rash or sores (lesions) on your affected limb.  Have shortness of breath.  Have a fever or chills. Summary  Lymphedema is swelling that is caused by the abnormal collection of lymph in the tissues under the skin.  Lymph is fluid from the tissues in your body that is removed through the lymphatic system. This system collects and carries excess fluid, fats, proteins, and wastes from the tissues of the body to the  bloodstream.  Lymphedema causes swelling, pain, and redness in the affected area. This may include the trunk area, or an arm or leg.  Treatment for this condition may depend on the cause of your lymphedema. Treatment may include complete decongestive therapy (CDT), compression methods, surgery, or treating the underlying cause. This information is not intended to replace advice given to you by your health care provider. Make sure you discuss any questions you have with your health care provider. Document Revised: 10/12/2017 Document Reviewed: 10/12/2017 Elsevier Patient Education  2020 [place].  Lymphedema  Lymphedema is swelling that is caused by the abnormal collection of lymph in the tissues under the skin. Lymph is fluid from the tissues in your body that is removed through the lymphatic system. This system is part of your body's defense system (immune system) and includes lymph nodes and lymph vessels. The lymph vessels collect and carry  the excess fluid, fats, proteins, and wastes from the tissues of the body to the bloodstream. This system also works to clean and remove bacteria and waste products from the body. Lymphedema occurs when the lymphatic system is blocked. When the lymph vessels or lymph nodes are blocked or damaged, lymph does not drain properly. This causes an abnormal buildup of lymph, which leads to swelling in the affected area. This may include the trunk area, or an arm or leg. Lymphedema cannot be cured by medicines, but various methods can be used to help reduce the swelling. There are two types of lymphedema: primary lymphedema and secondary lymphedema. What are the causes? The cause of this condition depends on the type of lymphedema that you have.  Primary lymphedema is caused by the absence of lymph vessels or having abnormal lymph vessels at birth.  Secondary lymphedema occurs when lymph vessels are blocked or damaged. Secondary lymphedema is more common.  Common causes of lymph vessel blockage include: ? Skin infection, such as cellulitis. ? Infection by parasites (filariasis). ? Injury. ? Radiation therapy. ? Cancer. ? Formation of scar tissue. ? Surgery. What are the signs or symptoms? Symptoms of this condition include:  Swelling of the arm or leg.  A heavy or tight feeling in the arm or leg.  Swelling of the feet, toes, or fingers. Shoes or rings may fit more tightly than before.  Redness of the skin over the affected area.  Limited movement of the affected limb.  Sensitivity to touch or discomfort in the affected limb. How is this diagnosed? This condition may be diagnosed based on:  Your symptoms and medical history.  A physical exam.  Bioimpedance spectroscopy. In this test, painless electrical currents are used to measure fluid levels in your body.  Imaging tests, such as: ? Lymphoscintigraphy. In this test, a low dose of a radioactive substance is injected to trace the flow of lymph through the lymph vessels. ? MRI. ? CT scan. ? Duplex ultrasound. This test uses sound waves to produce images of the vessels and the blood flow on a screen. ? Lymphangiography. In this test, a contrast dye is injected into the lymph vessel to help show blockages. How is this treated? Treatment for this condition may depend on the cause of your lymphedema. Treatment may include:  Complete decongestive therapy (CDT). This is done by a certified lymphedema therapist to reduce fluid congestion. This therapy includes: ? Manual lymph drainage. This is a special massage technique that promotes lymph drainage out of a limb. ? Skin care. ? Compression wrapping of the affected area. ? Specific exercises. Certain exercises can help fluid move out of the affected limb.  Compression. Various methods may be used to apply pressure to the affected limb to reduce the swelling. They include: ? Wearing compression stockings or sleeves on the affected  limb. ? Wrapping the affected limb with special bandages.  Surgery. This is usually done for severe cases only. For example, surgery may be done if you have trouble moving the limb or if the swelling does not get better with other treatments. If an underlying condition is causing the lymphedema, treatment for that condition will be done. For example, antibiotic medicines may be used to treat an infection. Follow these instructions at home: Self-care  The affected area is more likely to become injured or infected. Take these steps to help prevent infection: ? Keep the affected area clean and dry. ? Use approved creams or lotions to keep the  skin moisturized. ? Protect your skin from cuts:  Use gloves while cooking or gardening.  Do not walk barefoot.  If you shave the affected area, use an Copy.  Do not wear tight clothes, shoes, or jewelry.  Eat a healthy diet that includes a lot of fruits and vegetables. Activity  Exercise regularly as directed by your health care provider.  Do not sit with your legs crossed.  When possible, keep the affected limb raised (elevated) above the level of your heart.  Avoid carrying things with an arm that is affected by lymphedema. General instructions  Wear compression stockings or sleeves as told by your health care provider.  Note any changes in size of the affected limb. You may be instructed to take regular measurements and keep track of them.  Take over-the-counter and prescription medicines only as told by your health care provider.  If you were prescribed an antibiotic medicine, take or apply it as told by your health care provider. Do not stop using the antibiotic even if you start to feel better.  Do not use heating pads or ice packs over the affected area.  Avoid having blood draws, IV insertions, or blood pressure checked on the affected limb.  Keep all follow-up visits as told by your health care provider. This is  important. Contact a health care provider if you:  Continue to have swelling in your limb.  Have a cut that does not heal.  Have redness or pain in the affected area. Get help right away if you:  Have new swelling in your limb that comes on suddenly.  Develop purplish spots, rash or sores (lesions) on your affected limb.  Have shortness of breath.  Have a fever or chills. Summary  Lymphedema is swelling that is caused by the abnormal collection of lymph in the tissues under the skin.  Lymph is fluid from the tissues in your body that is removed through the lymphatic system. This system collects and carries excess fluid, fats, proteins, and wastes from the tissues of the body to the bloodstream.  Lymphedema causes swelling, pain, and redness in the affected area. This may include the trunk area, or an arm or leg.  Treatment for this condition may depend on the cause of your lymphedema. Treatment may include complete decongestive therapy (CDT), compression methods, surgery, or treating the underlying cause. This information is not intended to replace advice given to you by your health care provider. Make sure you discuss any questions you have with your health care provider. Document Revised: 10/12/2017 Document Reviewed: 10/12/2017 Elsevier Patient Education  2020 [employer].

## 2020-06-05 ENCOUNTER — Encounter (INDEPENDENT_AMBULATORY_CARE_PROVIDER_SITE_OTHER): Payer: Self-pay | Admitting: Nurse Practitioner

## 2020-06-05 ENCOUNTER — Other Ambulatory Visit: Payer: Self-pay

## 2020-06-05 ENCOUNTER — Ambulatory Visit (INDEPENDENT_AMBULATORY_CARE_PROVIDER_SITE_OTHER): Payer: Medicare Other | Admitting: Nurse Practitioner

## 2020-06-05 VITALS — BP 129/77 | HR 91 | Resp 16 | Wt 220.8 lb

## 2020-06-05 DIAGNOSIS — I890 Lymphedema, not elsewhere classified: Secondary | ICD-10-CM

## 2020-06-05 DIAGNOSIS — E782 Mixed hyperlipidemia: Secondary | ICD-10-CM | POA: Diagnosis not present

## 2020-06-06 ENCOUNTER — Encounter (INDEPENDENT_AMBULATORY_CARE_PROVIDER_SITE_OTHER): Payer: Self-pay | Admitting: Nurse Practitioner

## 2020-06-06 ENCOUNTER — Ambulatory Visit: Payer: Medicare Other | Admitting: Occupational Therapy

## 2020-06-06 DIAGNOSIS — I890 Lymphedema, not elsewhere classified: Secondary | ICD-10-CM | POA: Diagnosis not present

## 2020-06-06 NOTE — Progress Notes (Signed)
Subjective:    Patient ID: Sharon [name], female    DOB: 21-Nov-1945, 74 y.o.   MRN: [id number] Chief Complaint  Patient presents with  . Follow-up    bil leg redness    Patient presents today for evaluation of lower extremity lymphedema.  The patient has a previous history of recurrent cellulitis and has been on penicillin for suppressive therapy.  Normally the patient has a very diligent routine of wearing her medical grade 1 compression stockings in addition to utilizing her lymphedema pump.  However during the recent temperature changes the weather was over 100 degrees and due to this heat humidity the patient was unable to tolerate wearing her compression hose.  Also Wednesday the patient had several errands to run and found herself being in a dependent position for extended periods of time.  Several days following this the patient had noted worsening swelling with evidence of cellulitis.  The patient's primary care provider stopped her penicillin and placed her on doxycycline for treatment.  The patient has now resumed penicillin.  The patient notes that there is still some persistent stasis dermatitis that has been present however the pain and swelling that was present is no longer there.  She denies any fever, chills, nausea vomiting or diarrhea.  She denies any spreading of the cellulitis.   Review of Systems  Cardiovascular: Positive for leg swelling.  Psychiatric/Behavioral: The patient is nervous/anxious.   All other systems reviewed and are negative.      Objective:   Physical Exam Vitals reviewed.  Cardiovascular:     Rate and Rhythm: Normal rate and regular rhythm.     Pulses: Normal pulses.  Pulmonary:     Effort: Pulmonary effort is normal.  Skin:    General: Skin is warm and dry.     Findings: Erythema (Persistent stasis dermatitis) present.  Neurological:     Mental Status: She is alert and oriented to person, place, and time.  Psychiatric:        Mood and Affect:  Mood normal.        Behavior: Behavior normal.        Thought Content: Thought content normal.        Judgment: Judgment normal.     BP 129/77 (BP Location: Right Arm)   Pulse 91   Resp 16   Wt 220 lb 12.8 oz (100.2 kg)   LMP  (LMP Unknown)   BMI 32.61 kg/m   Past Medical History:  Diagnosis Date  . Anxiety   . Arthritis    hands, upper back  . Asthma   . COPD (chronic obstructive pulmonary disease) ([place])   . History of cervical cancer   . Menopausal disorder   . Osteoporosis   . Pneumonia 1960  . Spasm of abdominal muscles of right side    intermittent  . TMJ (dislocation of temporomandibular joint)   . Wears dentures    partial lower    Social History   Socioeconomic History  . Marital status: Married    Spouse name: Affie Gasner  . Number of children: 1  . Years of education: Not on file  . Highest education level: Some college, no degree  Occupational History  . Occupation: retired  Tobacco Use  . Smoking status: Former Smoker    Years: 56.00    Types: Cigarettes    Quit date: 07/14/2016    Years since quitting: 3.8  . Smokeless tobacco: Never Used  Vaping Use  . Vaping  Use: Former  Substance and Sexual Activity  . Alcohol use: Yes    Alcohol/week: 0.0 standard drinks  . Drug use: No  . Sexual activity: Not Currently    Birth control/protection: Post-menopausal  Other Topics Concern  . 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 Difficulty of Paying Living Expenses: Not hard at all  Food Insecurity: No Food Insecurity  . Worried About Charity fundraiser in the Last Year: Never true  . Ran Out of Food in the Last Year: Never true  Transportation Needs: No Transportation Needs  . Lack of Transportation (Medical): No  . Lack of Transportation (Non-Medical): No  Physical Activity: Inactive  . Days of Exercise per Week: 0 days  . Minutes of  Exercise per Session: 0 min  Stress: No Stress Concern Present  . Feeling of Stress : Not at all  Social Connections:   . Frequency of Communication with Friends and Family: Not on file  . Frequency of Social Gatherings with Friends and Family: Not on file  . Attends Religious Services: Not on file  . Active Member of Clubs or Organizations: Not on file  . Attends Archivist Meetings: Not on file  . Marital Status: Not on file  Intimate Partner Violence:   . Fear of Current or Ex-Partner: Not on file  . Emotionally Abused: Not on file  . Physically Abused: Not on file  . Sexually Abused: Not on file    Past Surgical History:  Procedure Laterality Date  . ABDOMINAL HYSTERECTOMY  1970's  . bladder botox  2005  . BLADDER SUSPENSION  2004  . CATARACT EXTRACTION W/PHACO Right 03/09/2018   Procedure: CATARACT EXTRACTION PHACO AND INTRAOCULAR LENS PLACEMENT ([place]) right;  Surgeon: Eulogio Bear, MD;  Location: [facility];  Service: Ophthalmology;  Laterality: Right;  CALL CELL 1ST  . CATARACT EXTRACTION W/PHACO Left 04/19/2018   Procedure: CATARACT EXTRACTION PHACO AND INTRAOCULAR LENS PLACEMENT ([facility])  LEFT;  Surgeon: Eulogio Bear, MD;  Location: [facility];  Service: Ophthalmology;  Laterality: Left;  . COLONOSCOPY WITH PROPOFOL N/A 12/13/2015   Procedure: COLONOSCOPY WITH PROPOFOL;  Surgeon: Lucilla Lame, MD;  Location: [facility];  Service: Endoscopy;  Laterality: N/A;  . LOOP RECORDER INSERTION N/A 01/07/2018   Procedure: LOOP RECORDER INSERTION;  Surgeon: Deboraha Sprang, MD;  Location: [facility];  Service: Cardiovascular;  Laterality: N/A;  . POLYPECTOMY N/A 12/13/2015   Procedure: POLYPECTOMY;  Surgeon: Lucilla Lame, MD;  Location: [facility];  Service: Endoscopy;  Laterality: N/A;  SIGMOID COLON POLYPS X  5  . SHOULDER ARTHROSCOPY W/ ROTATOR CUFF REPAIR Right 1998  . TEE WITHOUT CARDIOVERSION N/A 01/06/2018   Procedure:  TRANSESOPHAGEAL ECHOCARDIOGRAM (TEE);  Surgeon: Minna Merritts, MD;  Location: [facility];  Service: Cardiovascular;  Laterality: N/A;  . TONSILLECTOMY AND ADENOIDECTOMY      Family History  Problem Relation Age of Onset  . Diabetes Mother   . Heart disease Mother   . Stroke Mother   . Stroke Maternal Grandmother     Allergies  Allergen Reactions  . Baclofen Swelling  . Bactrim [Sulfamethoxazole-Trimethoprim] Nausea And Vomiting  . Ibuprofen Rash    Mouth swelling  . Latex Rash    Some bandaids, some gloves; BLOOD TEST NEGATIVE  . Librium [Chlordiazepoxide] Itching    Dizziness   .  Naprosyn [Naproxen] Rash    Mouth swelling  . Other Rash    [place] nuts - mouth swelling       Assessment & Plan:   1. Lymphedema The patient's lymphedema appears to be back under control at this time.  Patient does have known baseline erythema due to stasis dermatitis from recurrent episode of cellulitis.  The patient has more treatment scheduled with the lymphedema clinic, which would be helpful with wearing different techniques to continue to manage her lymphedema.  The patient was also advised to continue with utilization of her lymphedema pump in addition to wearing her compression stockings.  Currently the patient does utilize 20-30 medical grade compression stockings however if there is persistent swelling 30-40 may be considered as well.  The patient will also continue with penicillin for suppressive therapy.  The patient has an upcoming appointment in October we will maintain that appointment for follow-up.  2. Mixed hyperlipidemia Continue statin as ordered and reviewed, no changes at this time    Current Outpatient Medications on File Prior to Visit  Medication Sig Dispense Refill  . budesonide-formoterol (SYMBICORT) 160-4.5 MCG/ACT inhaler USE 2 PUFFS BY MOUTH TWICE DAILY. 10.2 g 12  . citalopram (CELEXA) 20 MG tablet Take 1 tablet (20 mg total) by mouth daily. 90 tablet 3  .  clopidogrel (PLAVIX) 75 MG tablet TAKE 1 TABLET(75 MG) BY MOUTH DAILY 90 tablet 3  . Cyanocobalamin 1000 MCG/ML KIT Inject 1,000 mcg as directed every 30 (thirty) days.    . pantoprazole (PROTONIX) 40 MG tablet Take 1 tablet (40 mg total) by mouth daily. 90 tablet 3  . penicillin v potassium (VEETID) 250 MG tablet Take 1 tablet (250 mg total) by mouth 2 (two) times daily. 60 tablet 3  . PROAIR HFA 108 (90 Base) MCG/ACT inhaler INHALE 2 PUFFS INTO THE LUNGS EVERY 6 HOURS AS NEEDED FOR WHEEZING OR SHORTNESS OF BREATH 8.5 g 2  . rosuvastatin (CRESTOR) 20 MG tablet Take 1 tablet (20 mg total) by mouth 3 (three) times a week. (Patient taking differently: Take 20 mg by mouth 3 (three) times a week. MWF) 40 tablet 3  . doxycycline (VIBRA-TABS) 100 MG tablet Take 1 tablet (100 mg total) by mouth 2 (two) times daily. (Patient not taking: Reported on 06/01/2020) 14 tablet 0  . estradiol (VIVELLE-DOT) 0.1 MG/24HR patch APPLY 1 PATCH ONTO THE SKIN 2 TIMES A WEEK (Patient not taking: Reported on 04/09/2020) 8 patch 11   No current facility-administered medications on file prior to visit.    There are no Patient Instructions on file for this visit. No follow-ups on file.   Kris Hartmann, NP

## 2020-06-06 NOTE — Therapy (Signed)
[place] MAIN [facility] SERVICES [address] [place], [name], [address] Phone: [phone]   Fax:  [phone]  Occupational Therapy Treatment  Patient Details  Name: Sharon [name] MRN: [id number] Date of Birth: 10-14-1945 Referring Provider (OT): Eulogio Ditch, NP   Encounter Date: 06/06/2020   OT End of Session - 06/06/20 1552    Visit Number 32    Number of Visits 36    Date for OT Re-Evaluation 06/19/20    OT Start Time 0237    OT Stop Time 0337    OT Time Calculation (min) 60 min    Activity Tolerance Patient tolerated treatment well;No increased pain    Behavior During Therapy WFL for tasks assessed/performed           Past Medical History:  Diagnosis Date  . Anxiety   . Arthritis    hands, upper back  . Asthma   . COPD (chronic obstructive pulmonary disease) ([place])   . History of cervical cancer   . Menopausal disorder   . Osteoporosis   . Pneumonia 1960  . Spasm of abdominal muscles of right side    intermittent  . TMJ (dislocation of temporomandibular joint)   . Wears dentures    partial lower    Past Surgical History:  Procedure Laterality Date  . ABDOMINAL HYSTERECTOMY  1970's  . bladder botox  2005  . BLADDER SUSPENSION  2004  . CATARACT EXTRACTION W/PHACO Right 03/09/2018   Procedure: CATARACT EXTRACTION PHACO AND INTRAOCULAR LENS PLACEMENT ([place]) right;  Surgeon: Eulogio Bear, MD;  Location: [facility];  Service: Ophthalmology;  Laterality: Right;  CALL CELL 1ST  . CATARACT EXTRACTION W/PHACO Left 04/19/2018   Procedure: CATARACT EXTRACTION PHACO AND INTRAOCULAR LENS PLACEMENT ([facility])  LEFT;  Surgeon: Eulogio Bear, MD;  Location: [facility];  Service: Ophthalmology;  Laterality: Left;  . COLONOSCOPY WITH PROPOFOL N/A 12/13/2015   Procedure: COLONOSCOPY WITH PROPOFOL;  Surgeon: Lucilla Lame, MD;  Location: [facility];  Service: Endoscopy;  Laterality: N/A;  . LOOP RECORDER INSERTION  N/A 01/07/2018   Procedure: LOOP RECORDER INSERTION;  Surgeon: Deboraha Sprang, MD;  Location: [facility];  Service: Cardiovascular;  Laterality: N/A;  . POLYPECTOMY N/A 12/13/2015   Procedure: POLYPECTOMY;  Surgeon: Lucilla Lame, MD;  Location: [facility];  Service: Endoscopy;  Laterality: N/A;  SIGMOID COLON POLYPS X  5  . SHOULDER ARTHROSCOPY W/ ROTATOR CUFF REPAIR Right 1998  . TEE WITHOUT CARDIOVERSION N/A 01/06/2018   Procedure: TRANSESOPHAGEAL ECHOCARDIOGRAM (TEE);  Surgeon: Minna Merritts, MD;  Location: [facility];  Service: Cardiovascular;  Laterality: N/A;  . TONSILLECTOMY AND ADENOIDECTOMY      There were no vitals filed for this visit.   Subjective Assessment - 06/06/20 1448    Subjective  Mrs Townsel returns today for OT visit 57 of 36 to address BLE Lymphedema. Pt was last seen by OT for LE care on 05/24/20. Pt reports day after last OT visit she saw PCP who treated increas3ed leg redness with doxycycline. "I feel better and the redness is better."    Pertinent History Hx anxiety, COPD, OA, Asthma, hx cervical ca, osteoporosis, hx pneumonia, hx CVA, hx LLE cellulitis, hx L leg wound, hx falls    Limitations chronic BLE leg swelling and associated pain, decreased balance, fall risk, decreased standing, walking and sitting tolerance, difficulty w/ lower body bathing and dressing, difficulty fitting LB clothing and shoes, unable to drive, impaired  transfers and functional mobility, impaired social participation, impaired ability to perform household chores, cooking, meal prep    Pain Onset More than a month ago                        OT Treatments/Exercises (OP) - 06/06/20 0001      ADLs   ADL Education Given Yes      Manual Therapy   Manual Therapy Edema management;Manual Lymphatic Drainage (MLD);Compression Bandaging    Manual therapy comments skin care w/ low ph castor oil throughout MLD to increase skin hydration and tissue mobuility/flexibility.     Manual Lymphatic Drainage (MLD) MLD to RLE as established. Added fibrosis technique and myofacial release at ankle and distal leg to increase tissue flexibility    Compression Bandaging Pt donned knee length compression stockings independently after session.                  OT Education - 06/06/20 1550    Education Details Continued discussion re POC. Pt verbalized understanding of need for eventual transition to LE self care when condition is stable and skilled OT is no longer medically necessary.    Person(s) Educated Patient    Methods Explanation;Demonstration    Comprehension Verbalized understanding;Returned demonstration               OT Long Term Goals - 04/11/20 1628      OT LONG TERM GOAL #1   Title Pt will demonstrate understanding of lymphedema (LE) precautions / prevention principals, including signs / symptoms of cellulitis infection with modified independence using LE Workbook as printed reference to identify 6 precautions without verbal cues by end of 3rd  OT Rx visit.     Time 3    Period Days    Status Achieved      OT LONG TERM GOAL #2   Title Pt will be able todon and doff existing compression garments independently and without assistive devices and/ or extra time for optimal lymphedema self management over time and to limit infection risk.    Baseline supervision    Time 4    Period Days    Status Achieved      OT LONG TERM GOAL #3   Title Pt to achieve at least  10% limb volume reduction in affected limb(s)  bilaterally during Intensive Phase CDT to control limb swelling, to improve tissue integrity and immune function, to improve ADLs performance and to improve functional mobility/ transfer, and to improve body image and self-esteem.    Baseline max A    Time 8    Period Weeks    Status On-going   decreased 1.05% on 02/15/20     OT LONG TERM GOAL #4   Title Pt will achieve 100% compliance with daily LE self-care home program components,  including daily  skin care, simple self-MLD, gradient compression wraps/ compression garments/devices, and therapeutic exercise with Mod CG support to ensure optimal Intensive Phase limb volume reduction to expedite compression garment/ device fitting.    Baseline 50% compliant    Time 12    Period Weeks    Status Achieved      OT LONG TERM GOAL #5   Title Pt will use appropriate  assistive devices during LE self-care training to don compression garments/devices with modified independence to ensure optimal LE self-management over time and to limit progression of chronic LE.    Baseline min A    Time 12  Period Weeks    Status Achieved   DC goal     OT LONG TERM GOAL #6   Title Pt will retain optimal limb volume reductions achieved during Intensive Phase CDT with no more than 3% volume increase with ongoing CG assistance to limit LE progression and further functional decline.    Baseline min A    Time 6    Period Months    Status On-going                 Plan - 06/06/20 1553    Clinical Impression Statement Pt tolerated MLD to BLE without difficulty today. Legs are less reddened below the knees and skin redness is reduced since last seen on 05/24/20. Pt had a course of docxycycline during interval which helped symptoms. She continues to c/o leg pain at medial R thigh, R leg and R knee. Cont as per POC revised last visit- 1 x weekly - 4-6 weeks.           Patient will benefit from skilled therapeutic intervention in order to improve the following deficits and impairments:           Visit Diagnosis: Lymphedema, not elsewhere classified    Problem List Patient Active Problem List   Diagnosis Date Noted  . Neuropathy of both feet 05/24/2020  . Hyperlipidemia 02/14/2020  . Obesity 02/14/2020  . Vitamin D deficiency 06/28/2019  . Prediabetes 06/28/2019  . GERD (gastroesophageal reflux disease) 12/07/2018  . Vitamin B12 deficiency 12/05/2018  . Senile purpura ([place])  07/28/2018  . Chronic venous insufficiency 07/14/2018  . Lymphedema 07/14/2018  . Aneurysm of descending thoracic aorta (HCC) 05/04/2018  . Allergic rhinitis 02/16/2018  . Aortic atherosclerosis ([place]) 01/13/2018  . Status post placement of implantable loop recorder 01/12/2018  . Chronic kidney disease, stage 3 01/12/2018  . History of CVA (cerebrovascular accident)   . TIA (transient ischemic attack) 01/04/2018  . CAD (coronary artery disease) 11/25/2017  . Caregiver stress 11/25/2017  . Menopause 08/14/2017  . De Quervain's tenosynovitis, right 07/17/2017  . Stress incontinence 06/19/2017  . Personal history of tobacco use, presenting hazards to health 04/01/2017  . Anxiety 03/13/2017  . Advanced care planning/counseling discussion 10/17/2016  . Benign neoplasm of sigmoid colon   . Centrilobular emphysema ([place]) 10/17/2015  . Mass of upper inner quadrant of right breast 06/22/2015    Andrey Spearman, MS, OTR/L, [facility] 06/06/20 3:56 PM  [place] MAIN [facility] SERVICES [address] [place], [name], [address] Phone: [phone]   Fax:  [phone]  Name: Sharon [name] MRN: [id number] Date of Birth: February 01, 1946

## 2020-06-14 ENCOUNTER — Ambulatory Visit (INDEPENDENT_AMBULATORY_CARE_PROVIDER_SITE_OTHER): Payer: Medicare Other | Admitting: *Deleted

## 2020-06-14 ENCOUNTER — Encounter

## 2020-06-14 DIAGNOSIS — I6310 Cerebral infarction due to embolism of unspecified precerebral artery: Secondary | ICD-10-CM

## 2020-06-14 LAB — CUP PACEART REMOTE DEVICE CHECK
Date Time Interrogation Session: 20210901233713
Implantable Pulse Generator Implant Date: 20190328

## 2020-06-15 NOTE — Progress Notes (Signed)
Carelink Summary Report / Loop Recorder 

## 2020-06-17 ENCOUNTER — Other Ambulatory Visit: Payer: Self-pay

## 2020-06-17 DIAGNOSIS — S80811A Abrasion, right lower leg, initial encounter: Secondary | ICD-10-CM | POA: Diagnosis not present

## 2020-06-17 DIAGNOSIS — Y939 Activity, unspecified: Secondary | ICD-10-CM | POA: Diagnosis not present

## 2020-06-17 DIAGNOSIS — I2510 Atherosclerotic heart disease of native coronary artery without angina pectoris: Secondary | ICD-10-CM | POA: Insufficient documentation

## 2020-06-17 DIAGNOSIS — Z87891 Personal history of nicotine dependence: Secondary | ICD-10-CM | POA: Insufficient documentation

## 2020-06-17 DIAGNOSIS — J449 Chronic obstructive pulmonary disease, unspecified: Secondary | ICD-10-CM | POA: Diagnosis not present

## 2020-06-17 DIAGNOSIS — D125 Benign neoplasm of sigmoid colon: Secondary | ICD-10-CM | POA: Insufficient documentation

## 2020-06-17 DIAGNOSIS — Y9289 Other specified places as the place of occurrence of the external cause: Secondary | ICD-10-CM | POA: Insufficient documentation

## 2020-06-17 DIAGNOSIS — W5501XA Bitten by cat, initial encounter: Secondary | ICD-10-CM | POA: Diagnosis not present

## 2020-06-17 DIAGNOSIS — Y999 Unspecified external cause status: Secondary | ICD-10-CM | POA: Diagnosis not present

## 2020-06-17 NOTE — ED Triage Notes (Signed)
Patient reports that her cats were fighting between her legs and scratched her right lower leg.  2 long scratches noted, bleeding controlled at this time.  Patient concerned because she has a history of cellulitis and lymphedema.

## 2020-06-18 ENCOUNTER — Other Ambulatory Visit: Payer: Self-pay | Admitting: Nurse Practitioner

## 2020-06-18 ENCOUNTER — Emergency Department
Admission: EM | Admit: 2020-06-18 | Discharge: 2020-06-18 | Disposition: A | Discharge status: Home / Self Care | Payer: Medicare Other | Attending: Emergency Medicine | Admitting: Emergency Medicine

## 2020-06-18 ENCOUNTER — Other Ambulatory Visit (INDEPENDENT_AMBULATORY_CARE_PROVIDER_SITE_OTHER): Payer: Self-pay | Admitting: Nurse Practitioner

## 2020-06-18 DIAGNOSIS — W5503XA Scratched by cat, initial encounter: Secondary | ICD-10-CM

## 2020-06-18 DIAGNOSIS — S80811A Abrasion, right lower leg, initial encounter: Secondary | ICD-10-CM | POA: Diagnosis not present

## 2020-06-18 DIAGNOSIS — L03119 Cellulitis of unspecified part of limb: Secondary | ICD-10-CM

## 2020-06-18 MED ORDER — BACITRACIN ZINC 500 UNIT/GM EX OINT
TOPICAL_OINTMENT | Freq: Two times a day (BID) | CUTANEOUS | Status: DC
  Administered 2020-06-18: 3 via TOPICAL
  Filled 2020-06-18: qty 2.7

## 2020-06-18 MED ORDER — AZITHROMYCIN 500 MG PO TABS
500.0000 mg | ORAL_TABLET | Freq: Once | ORAL | Status: AC
  Administered 2020-06-18: 500 mg via ORAL
  Filled 2020-06-18: qty 1

## 2020-06-18 MED ORDER — AZITHROMYCIN 250 MG PO TABS: ORAL_TABLET | ORAL | 0 refills | Status: DC

## 2020-06-18 NOTE — ED Provider Notes (Signed)
[facility] Emergency Department Provider Note  ____________________________________________  Time seen: Approximately 4:04 AM  I have reviewed the triage vital signs and the nursing notes.   HISTORY  Chief Complaint Cat Scratch   HPI Sharon [name] is a 74 y.o. female with a history of lymphedema and recurrent cellulitis of her lower extremities who presents for evaluation of a cat scratch to her right leg.  Patient reports that she has 3 cats at home who are older.  Today they were fighting and ended up scratching her leg.  She denies any fever or chills.  She is on Plavix but the bleeding is controlled.  Her last tetanus shot is up-to-date.   Past Medical History:  Diagnosis Date   Anxiety    Arthritis    hands, upper back   Asthma    COPD (chronic obstructive pulmonary disease) ([place])    History of cervical cancer    Menopausal disorder    Osteoporosis    Pneumonia 1960   Spasm of abdominal muscles of right side    intermittent   TMJ (dislocation of temporomandibular joint)    Wears dentures    partial lower    Patient Active Problem List   Diagnosis Date Noted   Neuropathy of both feet 05/24/2020   Hyperlipidemia 02/14/2020   Obesity 02/14/2020   Vitamin D deficiency 06/28/2019   Prediabetes 06/28/2019   GERD (gastroesophageal reflux disease) 12/07/2018   Vitamin B12 deficiency 12/05/2018   Senile purpura ([place]) 07/28/2018   Chronic venous insufficiency 07/14/2018   Lymphedema 07/14/2018   Aneurysm of descending thoracic aorta ([place]) 05/04/2018   Allergic rhinitis 02/16/2018   Aortic atherosclerosis ([place]) 01/13/2018   Status post placement of implantable loop recorder 01/12/2018   Chronic kidney disease, stage 3 01/12/2018   History of CVA (cerebrovascular accident)    TIA (transient ischemic attack) 01/04/2018   CAD (coronary artery disease) 11/25/2017   Caregiver stress 11/25/2017   Menopause  08/14/2017   De Quervain's tenosynovitis, right 07/17/2017   Stress incontinence 06/19/2017   Personal history of tobacco use, presenting hazards to health 04/01/2017   Anxiety 03/13/2017   Advanced care planning/counseling discussion 10/17/2016   Benign neoplasm of sigmoid colon    Centrilobular emphysema ([place]) 10/17/2015   Mass of upper inner quadrant of right breast 06/22/2015    Past Surgical History:  Procedure Laterality Date   ABDOMINAL HYSTERECTOMY  1970's   bladder botox  2005   BLADDER SUSPENSION  2004   CATARACT EXTRACTION W/PHACO Right 03/09/2018   Procedure: CATARACT EXTRACTION PHACO AND INTRAOCULAR LENS PLACEMENT ([name]) right;  Surgeon: Eulogio Bear, MD;  Location: [place];  Service: Ophthalmology;  Laterality: Right;  CALL CELL 1ST   CATARACT EXTRACTION W/PHACO Left 04/19/2018   Procedure: CATARACT EXTRACTION PHACO AND INTRAOCULAR LENS PLACEMENT (IOC)  LEFT;  Surgeon: Eulogio Bear, MD;  Location: [place];  Service: Ophthalmology;  Laterality: Left;   COLONOSCOPY WITH PROPOFOL N/A 12/13/2015   Procedure: COLONOSCOPY WITH PROPOFOL;  Surgeon: Lucilla Lame, MD;  Location: [place];  Service: Endoscopy;  Laterality: N/A;   LOOP RECORDER INSERTION N/A 01/07/2018   Procedure: LOOP RECORDER INSERTION;  Surgeon: Deboraha Sprang, MD;  Location: [place] [facility];  Service: Cardiovascular;  Laterality: N/A;   POLYPECTOMY N/A 12/13/2015   Procedure: POLYPECTOMY;  Surgeon: Lucilla Lame, MD;  Location: [place];  Service: Endoscopy;  Laterality: N/A;  SIGMOID COLON POLYPS X  5  SHOULDER ARTHROSCOPY W/ ROTATOR CUFF REPAIR Right 1998   TEE WITHOUT CARDIOVERSION N/A 01/06/2018   Procedure: TRANSESOPHAGEAL ECHOCARDIOGRAM (TEE);  Surgeon: Minna Merritts, MD;  Location: [facility];  Service: Cardiovascular;  Laterality: N/A;   TONSILLECTOMY AND ADENOIDECTOMY      Prior to Admission medications   Medication Sig Start  Date End Date Taking? Authorizing Provider  azithromycin (ZITHROMAX) 250 MG tablet Take 1 a day for 4 days 06/18/20   Alfred Levins, [place], MD  budesonide-formoterol [facility]) 160-4.5 MCG/ACT inhaler USE 2 PUFFS BY MOUTH TWICE DAILY. 01/18/20   [name], Henrine Screws T, NP  citalopram (CELEXA) 20 MG tablet Take 1 tablet (20 mg total) by mouth daily. 05/09/19   [name], Henrine Screws T, NP  clopidogrel (PLAVIX) 75 MG tablet TAKE 1 TABLET(75 MG) BY MOUTH DAILY 12/20/19   [name], Jolene T, NP  Cyanocobalamin 1000 MCG/ML KIT Inject 1,000 mcg as directed every 30 (thirty) days.    [provider]  doxycycline (VIBRA-TABS) 100 MG tablet Take 1 tablet (100 mg total) by mouth 2 (two) times daily. Patient not taking: Reported on 06/01/2020 05/24/20   Kathrine Haddock, NP  estradiol (VIVELLE-DOT) 0.1 MG/24HR patch APPLY 1 PATCH ONTO THE SKIN 2 TIMES A WEEK Patient not taking: Reported on 04/09/2020 05/09/19   [name] T, NP  pantoprazole (PROTONIX) 40 MG tablet Take 1 tablet (40 mg total) by mouth daily. 08/22/19   [name], Henrine Screws T, NP  penicillin v potassium (VEETID) 250 MG tablet Take 1 tablet (250 mg total) by mouth 2 (two) times daily. 02/13/20   Kris Hartmann, NP  PROAIR HFA 108 (90 Base) MCG/ACT inhaler INHALE 2 PUFFS INTO THE LUNGS EVERY 6 HOURS AS NEEDED FOR WHEEZING OR SHORTNESS OF BREATH 05/24/20   [name], Henrine Screws T, NP  rosuvastatin (CRESTOR) 20 MG tablet Take 1 tablet (20 mg total) by mouth 3 (three) times a week. Patient taking differently: Take 20 mg by mouth 3 (three) times a week. MWF 02/15/20   [name], Henrine Screws T, NP    Allergies Baclofen, Bactrim [sulfamethoxazole-trimethoprim], Ibuprofen, Latex, Librium [chlordiazepoxide], Naprosyn [naproxen], and Other  Family History  Problem Relation Age of Onset   Diabetes Mother    Heart disease Mother    Stroke Mother    Stroke Maternal Grandmother     Social History Social History   Tobacco Use   Smoking status: Former Smoker    Years: 56.00      Types: Cigarettes    Quit date: 07/14/2016    Years since quitting: 3.9   Smokeless tobacco: Never Used  Vaping Use   Vaping Use: Former  Substance Use Topics   Alcohol use: Yes    Alcohol/week: 0.0 standard drinks   Drug use: No    Review of Systems  Constitutional: Negative for fever. Eyes: Negative for visual changes. ENT: Negative for sore throat. Neck: No neck pain  Cardiovascular: Negative for chest pain. Respiratory: Negative for shortness of breath. Gastrointestinal: Negative for abdominal pain, vomiting or diarrhea. Genitourinary: Negative for dysuria. Musculoskeletal: Negative for back pain. Skin: Negative for rash. + scratch to RLE Neurological: Negative for headaches, weakness or numbness. Psych: No SI or HI  ____________________________________________   PHYSICAL EXAM:  VITAL SIGNS: ED Triage Vitals  Enc Vitals Group     BP 06/17/20 2154 (!) 146/86     Pulse Rate 06/17/20 2154 96     Resp 06/17/20 2154 18     Temp 06/17/20 2154 98.4 F (36.9 C)     Temp Source  06/17/20 2154 Oral     SpO2 06/17/20 2154 97 %     Weight 06/17/20 2150 221 lb (100.2 kg)     Height 06/17/20 2150 5' 9" (1.753 m)     Head Circumference --      Peak Flow --      Pain Score 06/17/20 2150 6     Pain Loc --      Pain Edu? --      Excl. in [place]? --     Constitutional: Alert and oriented. Well appearing and in no apparent distress. HEENT:      Head: Normocephalic and atraumatic.         Eyes: Conjunctivae are normal. Sclera is non-icteric.       Mouth/Throat: Mucous membranes are moist.       Neck: Supple with no signs of meningismus. Cardiovascular: Regular rate and rhythm.  Respiratory: Normal respiratory effort.  Musculoskeletal: Three linear scratches located on the RLE with no surrounding warmth or erythema.. Neurologic: Normal speech and language. Face is symmetric. Moving all extremities. No gross focal neurologic deficits are appreciated. Skin: Skin is warm,  dry and intact. No rash noted. Psychiatric: Mood and affect are normal. Speech and behavior are normal.  ____________________________________________   LABS (all labs ordered are listed, but only abnormal results are displayed)  Labs Reviewed - No data to display ____________________________________________  EKG  none  ____________________________________________  RADIOLOGY  none  ____________________________________________   PROCEDURES  Procedure(s) performed: None Procedures Critical Care performed:  None ____________________________________________   INITIAL IMPRESSION / ASSESSMENT AND PLAN / ED COURSE   74 y.o. female with a history of lymphedema and recurrent cellulitis of her lower extremities who presents for evaluation of a cat scratch to her right leg.  No signs of cellulitis at this time.  Bleeding is controlled.  Wound was sterilized with Betadine, washed thoroughly with 500 cc of normal saline.  Topical bacitracin was applied and covered with xeroform and sterile dressing. Patient was started on 5 day course of azithromycin to prevent any skin infections. Wound care discussed with patient, referral to wound clinic in 3 days for close follow-up.  Discussed return precautions for any signs of infection.  Old medical records reviewed.      _____________________________________________ Please note:  Patient was evaluated in Emergency Department today for the symptoms described in the history of present illness. Patient was evaluated in the context of the global COVID-19 pandemic, which necessitated consideration that the patient might be at risk for infection with the SARS-CoV-2 virus that causes COVID-19. Institutional protocols and algorithms that pertain to the evaluation of patients at risk for COVID-19 are in a state of rapid change based on information released by regulatory bodies including the CDC and federal and state organizations. These policies and  algorithms were followed during the patient's care in the ED.  Some ED evaluations and interventions may be delayed as a result of limited staffing during the pandemic.   [place] Controlled Substance Database was reviewed by me. ____________________________________________   FINAL CLINICAL IMPRESSION(S) / ED DIAGNOSES   Final diagnoses:  Cat scratch      NEW MEDICATIONS STARTED DURING THIS VISIT:  ED Discharge Orders         Ordered    azithromycin (ZITHROMAX) 250 MG tablet        06/18/20 0403           Note:  This document was prepared using Dragon voice recognition software and may include  unintentional dictation errors.    Alfred Levins, [place], MD 06/18/20 [phone]

## 2020-06-18 NOTE — ED Notes (Signed)
EDP in room at this time.

## 2020-06-18 NOTE — Discharge Instructions (Signed)
Keep the dressing in place for 24 hours.  After that make sure to wash the wound daily with warm water and soap, keep it dry and clean.  Apply topical bacitracin.  Take azithromycin as prescribed.  Follow-up with the wound clinic in 2 to 3 days.  Return to the emergency room for redness of the skin, if the skin feels hot to the touch, if you have a fever, if you notice pus

## 2020-06-19 ENCOUNTER — Ambulatory Visit: Payer: Medicare Other | Attending: Nurse Practitioner | Admitting: Occupational Therapy

## 2020-06-19 ENCOUNTER — Other Ambulatory Visit: Payer: Self-pay

## 2020-06-19 ENCOUNTER — Ambulatory Visit (INDEPENDENT_AMBULATORY_CARE_PROVIDER_SITE_OTHER): Payer: Medicare Other

## 2020-06-19 DIAGNOSIS — E538 Deficiency of other specified B group vitamins: Secondary | ICD-10-CM | POA: Diagnosis not present

## 2020-06-19 DIAGNOSIS — I890 Lymphedema, not elsewhere classified: Secondary | ICD-10-CM | POA: Insufficient documentation

## 2020-06-19 MED ORDER — CYANOCOBALAMIN 1000 MCG/ML IJ SOLN
1000.0000 ug | Freq: Once | INTRAMUSCULAR | Status: AC
  Administered 2020-06-19: 1000 ug via INTRAMUSCULAR

## 2020-06-19 NOTE — Telephone Encounter (Signed)
Is this Rx ok to refill it is for a cat scratch it was prescribed to the pt by the ER.

## 2020-06-19 NOTE — Therapy (Signed)
[place] MAIN [facility] SERVICES [address] [place], [name], [address] Phone: [phone]   Fax:  [phone]  Occupational Therapy Treatment  Patient Details  Name: Sharon [name] MRN: [id number] Date of Birth: Jan 12, 1946 Referring Provider (OT): Eulogio Ditch, NP   Encounter Date: 06/19/2020   OT End of Session - 06/19/20 1617    Visit Number 33    Number of Visits 36    Date for OT Re-Evaluation 06/19/20    OT Start Time 0300    OT Stop Time 0400    OT Time Calculation (min) 60 min    Activity Tolerance Patient tolerated treatment well;No increased pain    Behavior During Therapy WFL for tasks assessed/performed           Past Medical History:  Diagnosis Date  . Anxiety   . Arthritis    hands, upper back  . Asthma   . COPD (chronic obstructive pulmonary disease) ([place])   . History of cervical cancer   . Menopausal disorder   . Osteoporosis   . Pneumonia 1960  . Spasm of abdominal muscles of right side    intermittent  . TMJ (dislocation of temporomandibular joint)   . Wears dentures    partial lower    Past Surgical History:  Procedure Laterality Date  . ABDOMINAL HYSTERECTOMY  1970's  . bladder botox  2005  . BLADDER SUSPENSION  2004  . CATARACT EXTRACTION W/PHACO Right 03/09/2018   Procedure: CATARACT EXTRACTION PHACO AND INTRAOCULAR LENS PLACEMENT ([place]) right;  Surgeon: Eulogio Bear, MD;  Location: [place];  Service: Ophthalmology;  Laterality: Right;  CALL CELL 1ST  . CATARACT EXTRACTION W/PHACO Left 04/19/2018   Procedure: CATARACT EXTRACTION PHACO AND INTRAOCULAR LENS PLACEMENT (IOC)  LEFT;  Surgeon: Eulogio Bear, MD;  Location: [place];  Service: Ophthalmology;  Laterality: Left;  . COLONOSCOPY WITH PROPOFOL N/A 12/13/2015   Procedure: COLONOSCOPY WITH PROPOFOL;  Surgeon: Lucilla Lame, MD;  Location: [place];  Service: Endoscopy;  Laterality: N/A;  . LOOP RECORDER INSERTION  N/A 01/07/2018   Procedure: LOOP RECORDER INSERTION;  Surgeon: Deboraha Sprang, MD;  Location: [place] [facility];  Service: Cardiovascular;  Laterality: N/A;  . POLYPECTOMY N/A 12/13/2015   Procedure: POLYPECTOMY;  Surgeon: Lucilla Lame, MD;  Location: [place];  Service: Endoscopy;  Laterality: N/A;  SIGMOID COLON POLYPS X  5  . SHOULDER ARTHROSCOPY W/ ROTATOR CUFF REPAIR Right 1998  . TEE WITHOUT CARDIOVERSION N/A 01/06/2018   Procedure: TRANSESOPHAGEAL ECHOCARDIOGRAM (TEE);  Surgeon: Minna Merritts, MD;  Location: [facility];  Service: Cardiovascular;  Laterality: N/A;  . TONSILLECTOMY AND ADENOIDECTOMY      There were no vitals filed for this visit.   Subjective Assessment - 06/19/20 1520    Subjective  Sharon [name] returns today for OT visit 56 of 109 to address BLE Lymphedema. Pt presents with curlex on RLE from ankle to tibial tuberosity after cats scratched her over the weekend. PT went to ED and was given oral antibiotic. Pt denies leg pain.    Pertinent History Hx anxiety, COPD, OA, Asthma, hx cervical ca, osteoporosis, hx pneumonia, hx CVA, hx LLE cellulitis, hx L leg wound, hx falls    Limitations chronic BLE leg swelling and associated pain, decreased balance, fall risk, decreased standing, walking and sitting tolerance, difficulty w/ lower body bathing and dressing, difficulty fitting LB clothing and shoes, unable to drive, impaired transfers and functional mobility, impaired  social participation, impaired ability to perform household chores, cooking, meal prep    Pain Onset More than a month ago                        OT Treatments/Exercises (OP) - 06/19/20 0001      ADLs   ADL Education Given Yes      Manual Therapy   Manual Therapy Edema management    Manual therapy comments skin care w/ low ph castor oil throughout MLD to increase skin hydration and tissue mobuility/flexibility.    Manual Lymphatic Drainage (MLD) MLD to RLE above the knee only w  emphasis on lymphatic bottleneck at medial R knee.     Compression Bandaging Pt donned knee length compression stockings independently after session.                  OT Education - 06/19/20 1617    Education Details Reviewed signs/symptoms of cellulitis infection    Person(s) Educated Patient    Methods Explanation;Demonstration    Comprehension Verbalized understanding;Returned demonstration               OT Long Term Goals - 04/11/20 1628      OT LONG TERM GOAL #1   Title Pt will demonstrate understanding of lymphedema (LE) precautions / prevention principals, including signs / symptoms of cellulitis infection with modified independence using LE Workbook as printed reference to identify 6 precautions without verbal cues by end of 3rd  OT Rx visit.     Time 3    Period Days    Status Achieved      OT LONG TERM GOAL #2   Title Pt will be able todon and doff existing compression garments independently and without assistive devices and/ or extra time for optimal lymphedema self management over time and to limit infection risk.    Baseline supervision    Time 4    Period Days    Status Achieved      OT LONG TERM GOAL #3   Title Pt to achieve at least  10% limb volume reduction in affected limb(s)  bilaterally during Intensive Phase CDT to control limb swelling, to improve tissue integrity and immune function, to improve ADLs performance and to improve functional mobility/ transfer, and to improve body image and self-esteem.    Baseline max A    Time 8    Period Weeks    Status On-going   decreased 1.05% on 02/15/20     OT LONG TERM GOAL #4   Title Pt will achieve 100% compliance with daily LE self-care home program components, including daily  skin care, simple self-MLD, gradient compression wraps/ compression garments/devices, and therapeutic exercise with Mod CG support to ensure optimal Intensive Phase limb volume reduction to expedite compression garment/ device  fitting.    Baseline 50% compliant    Time 12    Period Weeks    Status Achieved      OT LONG TERM GOAL #5   Title Pt will use appropriate  assistive devices during LE self-care training to don compression garments/devices with modified independence to ensure optimal LE self-management over time and to limit progression of chronic LE.    Baseline min A    Time 12    Period Weeks    Status Achieved   DC goal     OT LONG TERM GOAL #6   Title Pt will retain optimal limb volume reductions achieved during Intensive  Phase CDT with no more than 3% volume increase with ongoing CG assistance to limit LE progression and further functional decline.    Baseline min A    Time 6    Period Months    Status On-going                 Plan - 06/19/20 1618    Clinical Impression Statement Did not remove dressing on R leg wounds to assess, but skin is cool proximally and distally to palpation and leg presents with minimal increased swelling above and below dressing. Pt sees her primary physician in a few days and in still on antibiotic. Provided MLE proximal to wounds in effort to offload increased lymph production. Pt donned compression after session. Cont as per POC.           Patient will benefit from skilled therapeutic intervention in order to improve the following deficits and impairments:           Visit Diagnosis: Lymphedema, not elsewhere classified    Problem List Patient Active Problem List   Diagnosis Date Noted  . Neuropathy of both feet 05/24/2020  . Hyperlipidemia 02/14/2020  . Obesity 02/14/2020  . Vitamin D deficiency 06/28/2019  . Prediabetes 06/28/2019  . GERD (gastroesophageal reflux disease) 12/07/2018  . Vitamin B12 deficiency 12/05/2018  . Senile purpura ([place]) 07/28/2018  . Chronic venous insufficiency 07/14/2018  . Lymphedema 07/14/2018  . Aneurysm of descending thoracic aorta (H[facility]) 05/04/2018  . Allergic rhinitis 02/16/2018  . Aortic atherosclerosis  ([place]) 01/13/2018  . Status post placement of implantable loop recorder 01/12/2018  . Chronic kidney disease, stage 3 01/12/2018  . History of CVA (cerebrovascular accident)   . TIA (transient ischemic attack) 01/04/2018  . CAD (coronary artery disease) 11/25/2017  . Caregiver stress 11/25/2017  . Menopause 08/14/2017  . De Quervain's tenosynovitis, right 07/17/2017  . Stress incontinence 06/19/2017  . Personal history of tobacco use, presenting hazards to health 04/01/2017  . Anxiety 03/13/2017  . Advanced care planning/counseling discussion 10/17/2016  . Benign neoplasm of sigmoid colon   . Centrilobular emphysema ([place]) 10/17/2015  . Mass of upper inner quadrant of right breast 06/22/2015    Andrey Spearman, MS, OTR/L, [facility] 06/19/20 4:25 PM  [place] MAIN [facility] SERVICES [address] [place], [name], [address] Phone: [phone]   Fax:  [phone]  Name: Sharon [name] MRN: [id number] Date of Birth: 1946-04-13

## 2020-06-20 ENCOUNTER — Other Ambulatory Visit: Payer: Self-pay | Admitting: Nurse Practitioner

## 2020-06-21 ENCOUNTER — Encounter: Payer: Self-pay | Admitting: Unknown Physician Specialty

## 2020-06-21 ENCOUNTER — Other Ambulatory Visit: Payer: Self-pay

## 2020-06-21 ENCOUNTER — Ambulatory Visit (INDEPENDENT_AMBULATORY_CARE_PROVIDER_SITE_OTHER): Payer: Medicare Other | Admitting: Unknown Physician Specialty

## 2020-06-21 VITALS — BP 114/69 | HR 86 | Temp 98.7°F

## 2020-06-21 DIAGNOSIS — L03115 Cellulitis of right lower limb: Secondary | ICD-10-CM

## 2020-06-21 NOTE — Progress Notes (Signed)
BP 114/69   Pulse 86   Temp 98.7 F (37.1 C) (Oral)   LMP  (LMP Unknown)   SpO2 96%    Subjective:    Patient ID: Sharon [name], female    DOB: 1946/07/06, 74 y.o.   MRN: [id number]  HPI: Sharon [name] is a 74 y.o. female  Chief Complaint  Patient presents with  . Wound Check    R calf, stated Azithromycin Tuesday, has one tablet left to take   Pt is here to recheck wound resulting from a cat scratch.  Went to the ER and received Azythromyacin.  States not much improvement in wound.  Unable to go to the wound center till the end of the month.  Leg was loosly wrapped   Relevant past medical, surgical, family and social history reviewed and updated as indicated. Interim medical history since our last visit reviewed. Allergies and medications reviewed and updated.  Review of Systems  Per HPI unless specifically indicated above     Objective:    BP 114/69   Pulse 86   Temp 98.7 F (37.1 C) (Oral)   LMP  (LMP Unknown)   SpO2 96%   Wt Readings from Last 3 Encounters:  06/17/20 221 lb (100.2 kg)  06/05/20 220 lb 12.8 oz (100.2 kg)  05/24/20 221 lb (100.2 kg)    Physical Exam Constitutional:      General: She is not in acute distress.    Appearance: Normal appearance. She is well-developed.  HENT:     Head: Normocephalic and atraumatic.  Eyes:     General: Lids are normal. No scleral icterus.       Right eye: No discharge.        Left eye: No discharge.     Conjunctiva/sclera: Conjunctivae normal.  Cardiovascular:     Rate and Rhythm: Normal rate.  Pulmonary:     Effort: Pulmonary effort is normal.  Abdominal:     Palpations: There is no hepatomegaly or splenomegaly.  Musculoskeletal:        General: Normal range of motion.  Skin:    Coloration: Skin is not pale.     Findings: No rash.     Comments: 2 long scratches on shin.  Some erythema and 2 plus edema.    Neurological:     Mental Status: She is alert and oriented to person, place, and time.    Psychiatric:        Behavior: Behavior normal.        Thought Content: Thought content normal.        Judgment: Judgment normal.     Results for orders placed or performed in visit on 06/14/20  CUP PACEART REMOTE DEVICE CHECK  Result Value Ref Range   Date Time Interrogation Session [phone]    Pulse Generator Manufacturer MERM    Pulse Gen Model OVF64 Reveal LINQ    Pulse Gen Serial Number [id number]    Clinic Name [facility]    Implantable Pulse Generator Type ICM/ILR    Implantable Pulse Generator Implant Date [id number]    Eval Rhythm SR at 70 bpm       Assessment & Plan:   Problem List Items Addressed This Visit    None    Visit Diagnoses    Cellulitis of right lower extremity    -  Primary   Unable to tolerate zinc wrap.  Used Coban, ab oint, and Telfa.  Recheck 1 week.  Follow up plan: Return in about 1 week (around 06/28/2020).

## 2020-06-22 ENCOUNTER — Telehealth: Payer: Self-pay | Admitting: Nurse Practitioner

## 2020-06-22 NOTE — Telephone Encounter (Signed)
Called to r/s 9/16 appointment, no answer, left vm

## 2020-06-26 ENCOUNTER — Other Ambulatory Visit: Payer: Self-pay

## 2020-06-26 ENCOUNTER — Ambulatory Visit: Payer: Medicare Other | Admitting: Occupational Therapy

## 2020-06-26 DIAGNOSIS — I890 Lymphedema, not elsewhere classified: Secondary | ICD-10-CM

## 2020-06-26 NOTE — Therapy (Signed)
[place] MAIN [facility] SERVICES [address] [place], [name], [address] Phone: [phone]   Fax:  [phone]  Occupational Therapy Treatment  Patient Details  Name: Sharon [name] MRN: [id number] Date of Birth: August 27, 1946 Referring Provider (OT): Eulogio Ditch, NP   Encounter Date: 06/26/2020   OT End of Session - 06/26/20 1621    Visit Number 34    Number of Visits 36    Date for OT Re-Evaluation 09/24/20    OT Start Time 0310    OT Stop Time 0410    OT Time Calculation (min) 60 min    Activity Tolerance Patient tolerated treatment well;No increased pain    Behavior During Therapy WFL for tasks assessed/performed           Past Medical History:  Diagnosis Date  . Anxiety   . Arthritis    hands, upper back  . Asthma   . COPD (chronic obstructive pulmonary disease) ([place])   . History of cervical cancer   . Menopausal disorder   . Osteoporosis   . Pneumonia 1960  . Spasm of abdominal muscles of right side    intermittent  . TMJ (dislocation of temporomandibular joint)   . Wears dentures    partial lower    Past Surgical History:  Procedure Laterality Date  . ABDOMINAL HYSTERECTOMY  1970's  . bladder botox  2005  . BLADDER SUSPENSION  2004  . CATARACT EXTRACTION W/PHACO Right 03/09/2018   Procedure: CATARACT EXTRACTION PHACO AND INTRAOCULAR LENS PLACEMENT ([place]) right;  Surgeon: Eulogio Bear, MD;  Location: [place];  Service: Ophthalmology;  Laterality: Right;  CALL CELL 1ST  . CATARACT EXTRACTION W/PHACO Left 04/19/2018   Procedure: CATARACT EXTRACTION PHACO AND INTRAOCULAR LENS PLACEMENT (IOC)  LEFT;  Surgeon: Eulogio Bear, MD;  Location: [place];  Service: Ophthalmology;  Laterality: Left;  . COLONOSCOPY WITH PROPOFOL N/A 12/13/2015   Procedure: COLONOSCOPY WITH PROPOFOL;  Surgeon: Lucilla Lame, MD;  Location: [place];  Service: Endoscopy;  Laterality: N/A;  . LOOP RECORDER INSERTION  N/A 01/07/2018   Procedure: LOOP RECORDER INSERTION;  Surgeon: Deboraha Sprang, MD;  Location: [place] [facility];  Service: Cardiovascular;  Laterality: N/A;  . POLYPECTOMY N/A 12/13/2015   Procedure: POLYPECTOMY;  Surgeon: Lucilla Lame, MD;  Location: [place];  Service: Endoscopy;  Laterality: N/A;  SIGMOID COLON POLYPS X  5  . SHOULDER ARTHROSCOPY W/ ROTATOR CUFF REPAIR Right 1998  . TEE WITHOUT CARDIOVERSION N/A 01/06/2018   Procedure: TRANSESOPHAGEAL ECHOCARDIOGRAM (TEE);  Surgeon: Minna Merritts, MD;  Location: [facility];  Service: Cardiovascular;  Laterality: N/A;  . TONSILLECTOMY AND ADENOIDECTOMY      There were no vitals filed for this visit.   Subjective Assessment - 06/26/20 1520    Subjective  Sharon [name] returns today for OT visit 51 of 46 to address BLE Lymphedema. Pt presents with compression garments in place, the RLE with bandages underneath. Pt reports RLE is healing well and signs /symptoms of infection are absent. OT did not remove dreessings.    Pertinent History Hx anxiety, COPD, OA, Asthma, hx cervical ca, osteoporosis, hx pneumonia, hx CVA, hx LLE cellulitis, hx L leg wound, hx falls    Limitations chronic BLE leg swelling and associated pain, decreased balance, fall risk, decreased standing, walking and sitting tolerance, difficulty w/ lower body bathing and dressing, difficulty fitting LB clothing and shoes, unable to drive, impaired transfers and functional mobility, impaired social  participation, impaired ability to perform household chores, cooking, meal prep    Pain Onset More than a month ago                        OT Treatments/Exercises (OP) - 06/26/20 0001      ADLs   ADL Education Given Yes      Manual Therapy   Manual Therapy Edema management    Manual therapy comments skin care w/ low ph castor oil throughout MLD to increase skin hydration and tissue mobuility/flexibility.    Manual Lymphatic Drainage (MLD) MLD to RLE above the  knee only w emphasis on lymphatic bottleneck at medial R knee.     Compression Bandaging Max A to don L knee high  ompression garment to [employer] Rx time                  OT Education - 06/26/20 1620    Education Details Continued discussion re POC. Pt verbalized understanding of need for eventual transition to LE self care when condition is stable and skilled OT is no longer medically necessary.    Person(s) Educated Patient    Methods Explanation;Demonstration    Comprehension Verbalized understanding;Returned demonstration               OT Long Term Goals - 04/11/20 1628      OT LONG TERM GOAL #1   Title Pt will demonstrate understanding of lymphedema (LE) precautions / prevention principals, including signs / symptoms of cellulitis infection with modified independence using LE Workbook as printed reference to identify 6 precautions without verbal cues by end of 3rd  OT Rx visit.     Time 3    Period Days    Status Achieved      OT LONG TERM GOAL #2   Title Pt will be able todon and doff existing compression garments independently and without assistive devices and/ or extra time for optimal lymphedema self management over time and to limit infection risk.    Baseline supervision    Time 4    Period Days    Status Achieved      OT LONG TERM GOAL #3   Title Pt to achieve at least  10% limb volume reduction in affected limb(s)  bilaterally during Intensive Phase CDT to control limb swelling, to improve tissue integrity and immune function, to improve ADLs performance and to improve functional mobility/ transfer, and to improve body image and self-esteem.    Baseline max A    Time 8    Period Weeks    Status On-going   decreased 1.05% on 02/15/20     OT LONG TERM GOAL #4   Title Pt will achieve 100% compliance with daily LE self-care home program components, including daily  skin care, simple self-MLD, gradient compression wraps/ compression garments/devices, and  therapeutic exercise with Mod CG support to ensure optimal Intensive Phase limb volume reduction to expedite compression garment/ device fitting.    Baseline 50% compliant    Time 12    Period Weeks    Status Achieved      OT LONG TERM GOAL #5   Title Pt will use appropriate  assistive devices during LE self-care training to don compression garments/devices with modified independence to ensure optimal LE self-management over time and to limit progression of chronic LE.    Baseline min A    Time 12    Period Weeks    Status  Achieved   DC goal     OT LONG TERM GOAL #6   Title Pt will retain optimal limb volume reductions achieved during Intensive Phase CDT with no more than 3% volume increase with ongoing CG assistance to limit LE progression and further functional decline.    Baseline min A    Time 6    Period Months    Status On-going                 Plan - 06/26/20 1622    Clinical Impression Statement Pt tolerated MLD with deep bliateral fibrosis techniques. Pt reported decreased pain ad tigtness pain when standing after manual therapy . Did not unwrap Rleg to examine wound. Pt reports it is healing well. Cont as per POC.           Patient will benefit from skilled therapeutic intervention in order to improve the following deficits and impairments:           Visit Diagnosis: Lymphedema, not elsewhere classified - Plan: Ot plan of care cert/re-cert    Problem List Patient Active Problem List   Diagnosis Date Noted  . Neuropathy of both feet 05/24/2020  . Hyperlipidemia 02/14/2020  . Obesity 02/14/2020  . Vitamin D deficiency 06/28/2019  . Prediabetes 06/28/2019  . GERD (gastroesophageal reflux disease) 12/07/2018  . Vitamin B12 deficiency 12/05/2018  . Senile purpura ([place]) 07/28/2018  . Chronic venous insufficiency 07/14/2018  . Lymphedema 07/14/2018  . Aneurysm of descending thoracic aorta (HCC) 05/04/2018  . Allergic rhinitis 02/16/2018  . Aortic  atherosclerosis ([place]) 01/13/2018  . Status post placement of implantable loop recorder 01/12/2018  . Chronic kidney disease, stage 3 01/12/2018  . History of CVA (cerebrovascular accident)   . TIA (transient ischemic attack) 01/04/2018  . CAD (coronary artery disease) 11/25/2017  . Caregiver stress 11/25/2017  . Menopause 08/14/2017  . De Quervain's tenosynovitis, right 07/17/2017  . Stress incontinence 06/19/2017  . Personal history of tobacco use, presenting hazards to health 04/01/2017  . Anxiety 03/13/2017  . Advanced care planning/counseling discussion 10/17/2016  . Benign neoplasm of sigmoid colon   . Centrilobular emphysema ([place]) 10/17/2015  . Mass of upper inner quadrant of right breast 06/22/2015    Andrey Spearman, MS, OTR/L, [facility] 06/26/20 4:27 PM   [place] MAIN [facility] SERVICES [address] [place], [name], [address] Phone: [phone]   Fax:  [phone]  Name: Sharon [name] MRN: [id number] Date of Birth: January 26, 1946

## 2020-06-28 ENCOUNTER — Ambulatory Visit: Payer: Medicare Other | Admitting: Unknown Physician Specialty

## 2020-06-28 ENCOUNTER — Other Ambulatory Visit: Payer: Self-pay

## 2020-06-28 ENCOUNTER — Ambulatory Visit (INDEPENDENT_AMBULATORY_CARE_PROVIDER_SITE_OTHER): Payer: Medicare Other | Admitting: Unknown Physician Specialty

## 2020-06-28 ENCOUNTER — Encounter: Payer: Self-pay | Admitting: Unknown Physician Specialty

## 2020-06-28 VITALS — BP 119/66 | HR 47 | Temp 98.2°F | Wt 220.0 lb

## 2020-06-28 DIAGNOSIS — S81801D Unspecified open wound, right lower leg, subsequent encounter: Secondary | ICD-10-CM | POA: Diagnosis not present

## 2020-06-28 NOTE — Progress Notes (Signed)
BP 119/66   Pulse (!) 47   Temp 98.2 F (36.8 C) (Oral)   Wt 220 lb (99.8 kg)   LMP  (LMP Unknown)   SpO2 96%   BMI 32.49 kg/m    Subjective:    Patient ID: Sharon [name], female    DOB: 1946/06/30, 74 y.o.   MRN: [id number]  HPI: [name] is a 74 y.o. female  Chief Complaint  Patient presents with  . Wound Check   Pt is here for evaluation of lower leg wound where she sustained cat scratches.  She says the wound is healing and has [facility]. She arrives today with non stick gauze dressing with compression stockings.  She work her IT trainer for about 5 days with good relief of symptoms.   Relevant past medical, surgical, family and social history reviewed and updated as indicated. Interim medical history since our last visit reviewed. Allergies and medications reviewed and updated.  Review of Systems  Per HPI unless specifically indicated above     Objective:    BP 119/66   Pulse (!) 47   Temp 98.2 F (36.8 C) (Oral)   Wt 220 lb (99.8 kg)   LMP  (LMP Unknown)   SpO2 96%   BMI 32.49 kg/m   Wt Readings from Last 3 Encounters:  06/28/20 220 lb (99.8 kg)  06/17/20 221 lb (100.2 kg)  06/05/20 220 lb 12.8 oz (100.2 kg)    Physical Exam Constitutional:      General: She is not in acute distress.    Appearance: Normal appearance. She is well-developed.  HENT:     Head: Normocephalic and atraumatic.  Eyes:     General: Lids are normal. No scleral icterus.       Right eye: No discharge.        Left eye: No discharge.     Conjunctiva/sclera: Conjunctivae normal.  Cardiovascular:     Rate and Rhythm: Normal rate.  Pulmonary:     Effort: Pulmonary effort is normal.  Abdominal:     Palpations: There is no hepatomegaly or splenomegaly.  Musculoskeletal:        General: Normal range of motion.  Skin:    Coloration: Skin is not pale.     Findings: No rash.     Comments: Right lower leg with healing scratches.  Some erythema lower leg.      Neurological:     Mental Status: She is alert and oriented to person, place, and time.  Psychiatric:        Behavior: Behavior normal.        Thought Content: Thought content normal.        Judgment: Judgment normal.     Results for orders placed or performed in visit on 06/14/20  CUP PACEART REMOTE DEVICE CHECK  Result Value Ref Range   Date Time Interrogation Session [phone]    Pulse Generator Manufacturer MERM    Pulse Gen Model VVO16 Reveal LINQ    Pulse Gen Serial Number [id number]    Clinic Name [facility]    Implantable Pulse Generator Type ICM/ILR    Implantable Pulse Generator Implant Date [id number]    Eval Rhythm SR at 70 bpm       Assessment & Plan:   Problem List Items Addressed This Visit    None    Visit Diagnoses    Wound of right lower extremity, subsequent encounter    -  Primary  Redressed with polysporin, non-stick gauze, and compression stocking.         Follow up plan: Return if symptoms worsen or fail to improve.

## 2020-06-29 ENCOUNTER — Ambulatory Visit: Payer: Medicare Other | Admitting: Nurse Practitioner

## 2020-07-03 ENCOUNTER — Ambulatory Visit: Payer: Medicare Other | Admitting: Occupational Therapy

## 2020-07-03 DIAGNOSIS — I890 Lymphedema, not elsewhere classified: Secondary | ICD-10-CM | POA: Diagnosis not present

## 2020-07-03 NOTE — Therapy (Signed)
[place] MAIN [facility] SERVICES [address] [place], [name], [address] Phone: [phone]   Fax:  [phone]  Occupational Therapy Treatment  Patient Details  Name: Sharon [name] MRN: [id number] Date of Birth: [birth year] Referring Provider (OT): Eulogio Ditch, NP   Encounter Date: 07/03/2020    Past Medical History:  Diagnosis Date  . Anxiety   . Arthritis    hands, upper back  . Asthma   . COPD (chronic obstructive pulmonary disease) ([place])   . History of cervical cancer   . Menopausal disorder   . Osteoporosis   . Pneumonia 1960  . Spasm of abdominal muscles of right side    intermittent  . TMJ (dislocation of temporomandibular joint)   . Wears dentures    partial lower    Past Surgical History:  Procedure Laterality Date  . ABDOMINAL HYSTERECTOMY  1970's  . bladder botox  2005  . BLADDER SUSPENSION  2004  . CATARACT EXTRACTION W/PHACO Right 03/09/2018   Procedure: CATARACT EXTRACTION PHACO AND INTRAOCULAR LENS PLACEMENT ([place]) right;  Surgeon: Eulogio Bear, MD;  Location: [place];  Service: Ophthalmology;  Laterality: Right;  CALL CELL 1ST  . CATARACT EXTRACTION W/PHACO Left 04/19/2018   Procedure: CATARACT EXTRACTION PHACO AND INTRAOCULAR LENS PLACEMENT (IOC)  LEFT;  Surgeon: Eulogio Bear, MD;  Location: [place];  Service: Ophthalmology;  Laterality: Left;  . COLONOSCOPY WITH PROPOFOL N/A 12/13/2015   Procedure: COLONOSCOPY WITH PROPOFOL;  Surgeon: Lucilla Lame, MD;  Location: [place];  Service: Endoscopy;  Laterality: N/A;  . LOOP RECORDER INSERTION N/A 01/07/2018   Procedure: LOOP RECORDER INSERTION;  Surgeon: Deboraha Sprang, MD;  Location: [place] [facility];  Service: Cardiovascular;  Laterality: N/A;  . POLYPECTOMY N/A 12/13/2015   Procedure: POLYPECTOMY;  Surgeon: Lucilla Lame, MD;  Location: [place];  Service: Endoscopy;  Laterality: N/A;  SIGMOID COLON POLYPS X  5  .  SHOULDER ARTHROSCOPY W/ ROTATOR CUFF REPAIR Right 1998  . TEE WITHOUT CARDIOVERSION N/A 01/06/2018   Procedure: TRANSESOPHAGEAL ECHOCARDIOGRAM (TEE);  Surgeon: Minna Merritts, MD;  Location: [facility];  Service: Cardiovascular;  Laterality: N/A;  . TONSILLECTOMY AND ADENOIDECTOMY      There were no vitals filed for this visit.   Subjective Assessment - 07/03/20 1651    Subjective  Sharon [name] returns today for OT visit 33of 68 to address BLE Lymphedema. Pt presents with compression garments in place bilaterally. She reports cat scratches on the fron of her R  leg are closed and healing well. She does not wear dressings on wounds today.    Pertinent History Hx anxiety, COPD, OA, Asthma, hx cervical ca, osteoporosis, hx pneumonia, hx CVA, hx LLE cellulitis, hx L leg wound, hx falls    Limitations chronic BLE leg swelling and associated pain, decreased balance, fall risk, decreased standing, walking and sitting tolerance, difficulty w/ lower body bathing and dressing, difficulty fitting LB clothing and shoes, unable to drive, impaired transfers and functional mobility, impaired social participation, impaired ability to perform household chores, cooking, meal prep    Pain Onset More than a month ago                        OT Treatments/Exercises (OP) - 07/03/20 0001      ADLs   ADL Education Given Yes      Manual Therapy   Manual Therapy Edema management    Manual therapy  comments skin care w/ low ph castor oil throughout MLD to increase skin hydration and tissue mobuility/flexibility.    Manual Lymphatic Drainage (MLD) MLD to BLE as established. No strokes to R leg in area of cat scratches, but strokes above and lateral/ medical    Compression Bandaging Max A to don L knee high  ompression garment to [employer] Rx time                  OT Education - 07/03/20 1651    Education Details Continued skilled Pt/caregiver education  And LE ADL training throughout visit for  lymphedema self care/ home program, including compression wrapping, compression garment and device wear/care, lymphatic pumping ther ex, simple self-MLD, and skin care. Discussed progress towards goals.    Person(s) Educated Patient    Methods Explanation;Demonstration    Comprehension Verbalized understanding;Returned demonstration               OT Long Term Goals - 04/11/20 1628      OT LONG TERM GOAL #1   Title Pt will demonstrate understanding of lymphedema (LE) precautions / prevention principals, including signs / symptoms of cellulitis infection with modified independence using LE Workbook as printed reference to identify 6 precautions without verbal cues by end of 3rd  OT Rx visit.     Time 3    Period Days    Status Achieved      OT LONG TERM GOAL #2   Title Pt will be able todon and doff existing compression garments independently and without assistive devices and/ or extra time for optimal lymphedema self management over time and to limit infection risk.    Baseline supervision    Time 4    Period Days    Status Achieved      OT LONG TERM GOAL #3   Title Pt to achieve at least  10% limb volume reduction in affected limb(s)  bilaterally during Intensive Phase CDT to control limb swelling, to improve tissue integrity and immune function, to improve ADLs performance and to improve functional mobility/ transfer, and to improve body image and self-esteem.    Baseline max A    Time 8    Period Weeks    Status On-going   decreased 1.05% on 02/15/20     OT LONG TERM GOAL #4   Title Pt will achieve 100% compliance with daily LE self-care home program components, including daily  skin care, simple self-MLD, gradient compression wraps/ compression garments/devices, and therapeutic exercise with Mod CG support to ensure optimal Intensive Phase limb volume reduction to expedite compression garment/ device fitting.    Baseline 50% compliant    Time 12    Period Weeks    Status  Achieved      OT LONG TERM GOAL #5   Title Pt will use appropriate  assistive devices during LE self-care training to don compression garments/devices with modified independence to ensure optimal LE self-management over time and to limit progression of chronic LE.    Baseline min A    Time 12    Period Weeks    Status Achieved   DC goal     OT LONG TERM GOAL #6   Title Pt will retain optimal limb volume reductions achieved during Intensive Phase CDT with no more than 3% volume increase with ongoing CG assistance to limit LE progression and further functional decline.    Baseline min A    Time 6    Period Months  Status On-going                 Plan - 07/03/20 1655    Clinical Impression Statement Pt tolerated BLE MLD using established techniques without increased pain. R anterior leg wounds/ cat scratches x 2, are closed and healing well. Signs/ sy,mptoms of infection are absent. Provided Max A to don bilateral compression garments after session to maximize treatment time. Cont as per POC.           Patient will benefit from skilled therapeutic intervention in order to improve the following deficits and impairments:           Visit Diagnosis: Lymphedema, not elsewhere classified    Problem List Patient Active Problem List   Diagnosis Date Noted  . Neuropathy of both feet 05/24/2020  . Hyperlipidemia 02/14/2020  . Obesity 02/14/2020  . Vitamin D deficiency 06/28/2019  . Prediabetes 06/28/2019  . GERD (gastroesophageal reflux disease) 12/07/2018  . Vitamin B12 deficiency 12/05/2018  . Senile purpura ([place]) 07/28/2018  . Chronic venous insufficiency 07/14/2018  . Lymphedema 07/14/2018  . Aneurysm of descending thoracic aorta (HCC) 05/04/2018  . Allergic rhinitis 02/16/2018  . Aortic atherosclerosis ([place]) 01/13/2018  . Status post placement of implantable loop recorder 01/12/2018  . Chronic kidney disease, stage 3 01/12/2018  . History of CVA (cerebrovascular  accident)   . TIA (transient ischemic attack) 01/04/2018  . CAD (coronary artery disease) 11/25/2017  . Caregiver stress 11/25/2017  . Menopause 08/14/2017  . De Quervain's tenosynovitis, right 07/17/2017  . Stress incontinence 06/19/2017  . Personal history of tobacco use, presenting hazards to health 04/01/2017  . Anxiety 03/13/2017  . Advanced care planning/counseling discussion 10/17/2016  . Benign neoplasm of sigmoid colon   . Centrilobular emphysema ([place]) 10/17/2015  . Mass of upper inner quadrant of right breast 06/22/2015    Andrey Spearman, MS, OTR/L, [facility] 07/03/20 5:03 PM  [place] MAIN [facility] SERVICES [address] [place], [name], [address] Phone: [phone]   Fax:  [phone]  Name: Sharon [name] MRN: [id number] Date of Birth: [birth year]

## 2020-07-10 ENCOUNTER — Ambulatory Visit: Payer: Medicare Other | Admitting: Occupational Therapy

## 2020-07-10 ENCOUNTER — Other Ambulatory Visit: Payer: Self-pay

## 2020-07-10 DIAGNOSIS — I890 Lymphedema, not elsewhere classified: Secondary | ICD-10-CM | POA: Diagnosis not present

## 2020-07-10 NOTE — Therapy (Signed)
[place] MAIN [facility] SERVICES [address] [place], [name], [address] Phone: [phone]   Fax:  [phone]  Occupational Therapy Treatment  Patient Details  Name: Sharon [name] MRN: [id number] Date of Birth: [birth year] Referring Provider (OT): Eulogio Ditch, NP   Encounter Date: 07/10/2020   OT End of Session - 07/10/20 1703    Visit Number 36    Number of Visits 36    Date for OT Re-Evaluation 09/24/20    OT Start Time 0100    OT Stop Time 0205    OT Time Calculation (min) 65 min    Activity Tolerance Patient tolerated treatment well;No increased pain    Behavior During Therapy WFL for tasks assessed/performed           Past Medical History:  Diagnosis Date   Anxiety    Arthritis    hands, upper back   Asthma    COPD (chronic obstructive pulmonary disease) (HCC)    History of cervical cancer    Menopausal disorder    Osteoporosis    Pneumonia 1960   Spasm of abdominal muscles of right side    intermittent   TMJ (dislocation of temporomandibular joint)    Wears dentures    partial lower    Past Surgical History:  Procedure Laterality Date   ABDOMINAL HYSTERECTOMY  1970's   bladder botox  2005   BLADDER SUSPENSION  2004   CATARACT EXTRACTION W/PHACO Right 03/09/2018   Procedure: CATARACT EXTRACTION PHACO AND INTRAOCULAR LENS PLACEMENT ([place]) right;  Surgeon: Eulogio Bear, MD;  Location: [place];  Service: Ophthalmology;  Laterality: Right;  CALL CELL 1ST   CATARACT EXTRACTION W/PHACO Left 04/19/2018   Procedure: CATARACT EXTRACTION PHACO AND INTRAOCULAR LENS PLACEMENT ([facility])  LEFT;  Surgeon: Eulogio Bear, MD;  Location: [place];  Service: Ophthalmology;  Laterality: Left;   COLONOSCOPY WITH PROPOFOL N/A 12/13/2015   Procedure: COLONOSCOPY WITH PROPOFOL;  Surgeon: Lucilla Lame, MD;  Location: [place];  Service: Endoscopy;  Laterality: N/A;   LOOP RECORDER INSERTION  N/A 01/07/2018   Procedure: LOOP RECORDER INSERTION;  Surgeon: Deboraha Sprang, MD;  Location: [place] [facility];  Service: Cardiovascular;  Laterality: N/A;   POLYPECTOMY N/A 12/13/2015   Procedure: POLYPECTOMY;  Surgeon: Lucilla Lame, MD;  Location: [place];  Service: Endoscopy;  Laterality: N/A;  SIGMOID COLON POLYPS X  5   SHOULDER ARTHROSCOPY W/ ROTATOR CUFF REPAIR Right 1998   TEE WITHOUT CARDIOVERSION N/A 01/06/2018   Procedure: TRANSESOPHAGEAL ECHOCARDIOGRAM (TEE);  Surgeon: Minna Merritts, MD;  Location: [facility];  Service: Cardiovascular;  Laterality: N/A;   TONSILLECTOMY AND ADENOIDECTOMY      There were no vitals filed for this visit.   Subjective Assessment - 07/10/20 1309    Subjective  Sharon [name] returns today for OT visit 34of 36 to address BLE Lymphedema. Pt presents with compression garments in place bilaterally. She reports 4/10 R leg pain today."It's not as bad as some days."    Pertinent History Hx anxiety, COPD, OA, Asthma, hx cervical ca, osteoporosis, hx pneumonia, hx CVA, hx LLE cellulitis, hx L leg wound, hx falls    Limitations chronic BLE leg swelling and associated pain, decreased balance, fall risk, decreased standing, walking and sitting tolerance, difficulty w/ lower body bathing and dressing, difficulty fitting LB clothing and shoes, unable to drive, impaired transfers and functional mobility, impaired social participation, impaired ability to perform household chores, cooking, meal prep  Pain Onset More than a month ago                        OT Treatments/Exercises (OP) - 07/10/20 0001      ADLs   ADL Education Given Yes      Manual Therapy   Manual Therapy Edema management;Manual Lymphatic Drainage (MLD);Compression Bandaging    Manual therapy comments skin care w/ low ph castor oil throughout MLD to increase skin hydration and tissue mobuility/flexibility.    Manual Lymphatic Drainage (MLD) MLD to LLE as established. No  strokes to R leg in area of cat scratches, but strokes above and lateral/ medical    Compression Bandaging Pt donned compression garment after session.                  OT Education - 07/10/20 1703    Education Details Continued skilled Pt/caregiver education  And LE ADL training throughout visit for lymphedema self care/ home program, including compression wrapping, compression garment and device wear/care, lymphatic pumping ther ex, simple self-MLD, and skin care. Discussed progress towards goals.    Person(s) Educated Patient    Methods Explanation;Demonstration    Comprehension Verbalized understanding;Returned demonstration               OT Long Term Goals - 04/11/20 1628      OT LONG TERM GOAL #1   Title Pt will demonstrate understanding of lymphedema (LE) precautions / prevention principals, including signs / symptoms of cellulitis infection with modified independence using LE Workbook as printed reference to identify 6 precautions without verbal cues by end of 3rd  OT Rx visit.     Time 3    Period Days    Status Achieved      OT LONG TERM GOAL #2   Title Pt will be able todon and doff existing compression garments independently and without assistive devices and/ or extra time for optimal lymphedema self management over time and to limit infection risk.    Baseline supervision    Time 4    Period Days    Status Achieved      OT LONG TERM GOAL #3   Title Pt to achieve at least  10% limb volume reduction in affected limb(s)  bilaterally during Intensive Phase CDT to control limb swelling, to improve tissue integrity and immune function, to improve ADLs performance and to improve functional mobility/ transfer, and to improve body image and self-esteem.    Baseline max A    Time 8    Period Weeks    Status On-going   decreased 1.05% on 02/15/20     OT LONG TERM GOAL #4   Title Pt will achieve 100% compliance with daily LE self-care home program components, including  daily  skin care, simple self-MLD, gradient compression wraps/ compression garments/devices, and therapeutic exercise with Mod CG support to ensure optimal Intensive Phase limb volume reduction to expedite compression garment/ device fitting.    Baseline 50% compliant    Time 12    Period Weeks    Status Achieved      OT LONG TERM GOAL #5   Title Pt will use appropriate  assistive devices during LE self-care training to don compression garments/devices with modified independence to ensure optimal LE self-management over time and to limit progression of chronic LE.    Baseline min A    Time 12    Period Weeks    Status Achieved  DC goal     OT LONG TERM GOAL #6   Title Pt will retain optimal limb volume reductions achieved during Intensive Phase CDT with no more than 3% volume increase with ongoing CG assistance to limit LE progression and further functional decline.    Baseline min A    Time 6    Period Months    Status On-going                 Plan - 07/10/20 1704    Clinical Impression Statement Cooler outdoor temperatures are making daily LE management easier, especially with tolerating hot compression stockings . Legs are less reddened and Pt with fewer complaints today than usual. Legs continue to respond well to consistent MLD with consistent compression. MLD is well tolerated without increased pain today. Consider reducing treatment frequency in mid October.           Patient will benefit from skilled therapeutic intervention in order to improve the following deficits and impairments:           Visit Diagnosis: Lymphedema, not elsewhere classified    Problem List Patient Active Problem List   Diagnosis Date Noted   Neuropathy of both feet 05/24/2020   Hyperlipidemia 02/14/2020   Obesity 02/14/2020   Vitamin D deficiency 06/28/2019   Prediabetes 06/28/2019   GERD (gastroesophageal reflux disease) 12/07/2018   Vitamin B12 deficiency 12/05/2018    Senile purpura ([place]) 07/28/2018   Chronic venous insufficiency 07/14/2018   Lymphedema 07/14/2018   Aneurysm of descending thoracic aorta ([place]) 05/04/2018   Allergic rhinitis 02/16/2018   Aortic atherosclerosis ([place]) 01/13/2018   Status post placement of implantable loop recorder 01/12/2018   Chronic kidney disease, stage 3 01/12/2018   History of CVA (cerebrovascular accident)    TIA (transient ischemic attack) 01/04/2018   CAD (coronary artery disease) 11/25/2017   Caregiver stress 11/25/2017   Menopause 08/14/2017   De Quervain's tenosynovitis, right 07/17/2017   Stress incontinence 06/19/2017   Personal history of tobacco use, presenting hazards to health 04/01/2017   Anxiety 03/13/2017   Advanced care planning/counseling discussion 10/17/2016   Benign neoplasm of sigmoid colon    Centrilobular emphysema ([place]) 10/17/2015   Mass of upper inner quadrant of right breast 06/22/2015    Andrey Spearman, MS, OTR/L, CLT-LANA 07/10/20 5:08 PM  [place] MAIN [facility] SERVICES [place], [name], [address] Phone: [phone]   Fax:  [phone]  Name: YILIA SACCA MRN: [id number] Date of Birth: 1945/10/25

## 2020-07-11 ENCOUNTER — Encounter: Payer: Medicare Other | Admitting: Internal Medicine

## 2020-07-11 ENCOUNTER — Other Ambulatory Visit: Payer: Self-pay

## 2020-07-17 ENCOUNTER — Ambulatory Visit (INDEPENDENT_AMBULATORY_CARE_PROVIDER_SITE_OTHER): Payer: Medicare Other

## 2020-07-17 ENCOUNTER — Ambulatory Visit (INDEPENDENT_AMBULATORY_CARE_PROVIDER_SITE_OTHER): Payer: Medicare Other | Admitting: Nurse Practitioner

## 2020-07-17 ENCOUNTER — Other Ambulatory Visit: Payer: Self-pay

## 2020-07-17 ENCOUNTER — Encounter: Payer: Self-pay | Admitting: Nurse Practitioner

## 2020-07-17 ENCOUNTER — Other Ambulatory Visit: Payer: Self-pay | Admitting: *Deleted

## 2020-07-17 ENCOUNTER — Encounter: Payer: Medicare Other | Admitting: Occupational Therapy

## 2020-07-17 VITALS — BP 120/64 | HR 87 | Temp 98.2°F | Ht 69.0 in | Wt 218.4 lb

## 2020-07-17 DIAGNOSIS — Z6832 Body mass index (BMI) 32.0-32.9, adult: Secondary | ICD-10-CM

## 2020-07-17 DIAGNOSIS — E782 Mixed hyperlipidemia: Secondary | ICD-10-CM

## 2020-07-17 DIAGNOSIS — I712 Thoracic aortic aneurysm, without rupture: Secondary | ICD-10-CM | POA: Diagnosis not present

## 2020-07-17 DIAGNOSIS — Z1231 Encounter for screening mammogram for malignant neoplasm of breast: Secondary | ICD-10-CM | POA: Diagnosis not present

## 2020-07-17 DIAGNOSIS — N1831 Chronic kidney disease, stage 3a: Secondary | ICD-10-CM

## 2020-07-17 DIAGNOSIS — I6310 Cerebral infarction due to embolism of unspecified precerebral artery: Secondary | ICD-10-CM | POA: Diagnosis not present

## 2020-07-17 DIAGNOSIS — I890 Lymphedema, not elsewhere classified: Secondary | ICD-10-CM

## 2020-07-17 DIAGNOSIS — J432 Centrilobular emphysema: Secondary | ICD-10-CM | POA: Diagnosis not present

## 2020-07-17 DIAGNOSIS — D692 Other nonthrombocytopenic purpura: Secondary | ICD-10-CM

## 2020-07-17 DIAGNOSIS — I700 Atherosclerosis of aorta: Secondary | ICD-10-CM

## 2020-07-17 DIAGNOSIS — Z0000 Encounter for general adult medical examination without abnormal findings: Secondary | ICD-10-CM | POA: Diagnosis not present

## 2020-07-17 DIAGNOSIS — K219 Gastro-esophageal reflux disease without esophagitis: Secondary | ICD-10-CM

## 2020-07-17 DIAGNOSIS — F419 Anxiety disorder, unspecified: Secondary | ICD-10-CM

## 2020-07-17 DIAGNOSIS — R7303 Prediabetes: Secondary | ICD-10-CM

## 2020-07-17 DIAGNOSIS — E559 Vitamin D deficiency, unspecified: Secondary | ICD-10-CM | POA: Diagnosis not present

## 2020-07-17 DIAGNOSIS — Z23 Encounter for immunization: Secondary | ICD-10-CM | POA: Diagnosis not present

## 2020-07-17 DIAGNOSIS — R739 Hyperglycemia, unspecified: Secondary | ICD-10-CM | POA: Diagnosis not present

## 2020-07-17 DIAGNOSIS — I7123 Aneurysm of the descending thoracic aorta, without rupture: Secondary | ICD-10-CM

## 2020-07-17 DIAGNOSIS — Z87891 Personal history of nicotine dependence: Secondary | ICD-10-CM

## 2020-07-17 DIAGNOSIS — K140 Glossitis: Secondary | ICD-10-CM | POA: Diagnosis not present

## 2020-07-17 DIAGNOSIS — E538 Deficiency of other specified B group vitamins: Secondary | ICD-10-CM | POA: Diagnosis not present

## 2020-07-17 DIAGNOSIS — Z122 Encounter for screening for malignant neoplasm of respiratory organs: Secondary | ICD-10-CM

## 2020-07-17 DIAGNOSIS — E6609 Other obesity due to excess calories: Secondary | ICD-10-CM

## 2020-07-17 LAB — CUP PACEART REMOTE DEVICE CHECK
Date Time Interrogation Session: 20211004233731
Implantable Pulse Generator Implant Date: 20190328

## 2020-07-17 MED ORDER — CYANOCOBALAMIN 1000 MCG/ML IJ SOLN
1000.0000 ug | Freq: Once | INTRAMUSCULAR | Status: AC
  Administered 2020-07-17: 1000 ug via INTRAMUSCULAR

## 2020-07-17 MED ORDER — PANTOPRAZOLE SODIUM 40 MG PO TBEC
DELAYED_RELEASE_TABLET | ORAL | 4 refills | Status: DC
Start: 2020-07-17 — End: 2021-07-25

## 2020-07-17 NOTE — Assessment & Plan Note (Addendum)
Chronic, ongoing.  Continue current medication regimen and adjust as needed.  May benefit from change to [facility] for once daily dosing and LAMA/LABA combo.  Obtain spirometry next visit. Will [facility] to [facility] about patient concerns as enters donut hole.  Continue annual lung screening.  Return in 6 months.

## 2020-07-17 NOTE — Progress Notes (Addendum)
BP 120/64 (BP Location: Left Arm, Patient Position: Sitting, Cuff Size: Normal)   Pulse 87   Temp 98.2 F (36.8 C) (Oral)   Ht 5' 9"  (1.753 m)   Wt 218 lb 6.4 oz (99.1 kg)   LMP  (LMP Unknown)   SpO2 97%   BMI 32.25 kg/m    Subjective:    Patient ID: Sharon [name], female    DOB: 02-Sep-1946, 74 y.o.   MRN: [id number]  HPI: Sharon [name] is a 74 y.o. female presenting on 07/17/2020 for comprehensive medical examination. Current medical complaints include:none  She currently lives with: husband Menopausal Symptoms: no   COPD Continues on Symbicort daily and Albuterol PRN.Going for her annual lung screening this month.  In donut hole with Symbicort at this time. COPD status:stable Satisfied with current treatment?:yes Oxygen use:no Dyspnea frequency:intermittent Cough frequency:intermittent Rescue inhaler frequency:  Limitation of activity:no Productive cough: none Last Spirometry:unknown Pneumovax:Up to Date Influenza:Up to Date  CHRONIC KIDNEY DISEASE Recent labs noted GFR 56.   CKD status: stable Medications renally dose: yes Previous renal evaluation: no Pneumovax:  Up to Date Influenza Vaccine:  Up to Date   HYPERLIPIDEMIA Continues Crestor 20 MG three times weekly. Hyperlipidemia status: good compliance Satisfied with current treatment?  yes Side effects:  no Medication compliance: good compliance Supplements: none Aspirin:  no The ASCVD Risk score Mikey Bussing DC Jr., et al., 2013) failed to calculate for the following reasons:   The patient has a prior MI or stroke diagnosis Chest pain:  no Coronary artery disease:  no Family history CAD:  no Family history early CAD:  no   PREDIABETES Recent A1C 02/14/20 was 6.5%, she wished to focus on diet and not start medication. Polydipsia/polyuria: no Visual disturbance: no Chest pain: no Paresthesias: no  LYMPHEDEMA WITH CHRONIC VENOUS INSUFFICIENCY: Saw vascular last 06/05/20. Was treated for  cellulitis weeks back with abx. Going to massage therapist which she reports benefit from + using massage pump.  Does endorses difficulty being off feet, due to caring for husband who receives dialysis 3 times a week.  Does take Tylenol for pain, which benefits at times.  Taking Penicillin V twice a day ordered by vascular.  VITAMIN B12 DEFICIENCY: Continues on monthly injections.  Last level in April was 1,194.  Has Vitamin D deficiency, continues supplement with recent level 18.1.  DEPRESSION Continues on Celexa 20 MG as needed for mood, uses for when driving down highway -- reports this helps on a PRN basis. Mood status: stable Satisfied with current treatment?: yes Symptom severity: mild  Duration of current treatment : chronic Side effects: no Medication compliance: good compliance Psychotherapy/counseling: none Depressed mood: no Anxious mood: no Anhedonia: no Significant weight loss or gain: no Insomnia: none Fatigue: no Feelings of worthlessness or guilt: no Impaired concentration/indecisiveness: no Suicidal ideations: no Hopelessness: no Crying spells: no Depression screen [facility] 2/9 07/17/2020 04/09/2020 06/28/2019 04/07/2019  Decreased Interest 0 0 1 0  Down, Depressed, Hopeless 0 0 1 1  PHQ - 2 Score 0 0 2 1  Altered sleeping 1 - 0 -  Tired, decreased energy 1 - 1 -  Change in appetite 0 - 0 -  Feeling bad or failure about yourself  0 - 0 -  Trouble concentrating 0 - 0 -  Moving slowly or fidgety/restless 0 - 0 -  Suicidal thoughts 0 - 0 -  PHQ-9 Score 2 - 3 -  Difficult doing work/chores Not difficult at all - Not difficult  at all -  Some recent data might be hidden   The patient does not have a history of falls. I did not complete a risk assessment for falls. A plan of care for falls was not documented.   Past Medical History:  Past Medical History:  Diagnosis Date  . Anxiety   . Arthritis    hands, upper back  . Asthma   . COPD (chronic obstructive  pulmonary disease) ([place])   . History of cervical cancer   . Menopausal disorder   . Osteoporosis   . Pneumonia 1960  . Spasm of abdominal muscles of right side    intermittent  . TMJ (dislocation of temporomandibular joint)   . Wears dentures    partial lower    Surgical History:  Past Surgical History:  Procedure Laterality Date  . ABDOMINAL HYSTERECTOMY  1970's  . bladder botox  2005  . BLADDER SUSPENSION  2004  . CATARACT EXTRACTION W/PHACO Right 03/09/2018   Procedure: CATARACT EXTRACTION PHACO AND INTRAOCULAR LENS PLACEMENT ([place]) right;  Surgeon: Eulogio Bear, MD;  Location: [facility];  Service: Ophthalmology;  Laterality: Right;  CALL CELL 1ST  . CATARACT EXTRACTION W/PHACO Left 04/19/2018   Procedure: CATARACT EXTRACTION PHACO AND INTRAOCULAR LENS PLACEMENT ([facility])  LEFT;  Surgeon: Eulogio Bear, MD;  Location: [facility];  Service: Ophthalmology;  Laterality: Left;  . COLONOSCOPY WITH PROPOFOL N/A 12/13/2015   Procedure: COLONOSCOPY WITH PROPOFOL;  Surgeon: Lucilla Lame, MD;  Location: [facility];  Service: Endoscopy;  Laterality: N/A;  . LOOP RECORDER INSERTION N/A 01/07/2018   Procedure: LOOP RECORDER INSERTION;  Surgeon: Deboraha Sprang, MD;  Location: [facility];  Service: Cardiovascular;  Laterality: N/A;  . POLYPECTOMY N/A 12/13/2015   Procedure: POLYPECTOMY;  Surgeon: Lucilla Lame, MD;  Location: [facility];  Service: Endoscopy;  Laterality: N/A;  SIGMOID COLON POLYPS X  5  . SHOULDER ARTHROSCOPY W/ ROTATOR CUFF REPAIR Right 1998  . TEE WITHOUT CARDIOVERSION N/A 01/06/2018   Procedure: TRANSESOPHAGEAL ECHOCARDIOGRAM (TEE);  Surgeon: Minna Merritts, MD;  Location: [facility];  Service: Cardiovascular;  Laterality: N/A;  . TONSILLECTOMY AND ADENOIDECTOMY      Medications:  Current Outpatient Medications on File Prior to Visit  Medication Sig  . acetaminophen (TYLENOL) 500 MG tablet Take 500 mg by mouth every 6 (six) hours  as needed.  . budesonide-formoterol (SYMBICORT) 160-4.5 MCG/ACT inhaler INHALE 2 PUFFS BY MOUTH TWICE DAILY  . citalopram (CELEXA) 20 MG tablet Take 1 tablet (20 mg total) by mouth daily.  . clopidogrel (PLAVIX) 75 MG tablet TAKE 1 TABLET(75 MG) BY MOUTH DAILY  . Cyanocobalamin 1000 MCG/ML KIT Inject 1,000 mcg as directed every 30 (thirty) days.  . Multiple Vitamin (MULTIVITAMIN) tablet Take 1 tablet by mouth daily.  . penicillin v potassium (VEETID) 250 MG tablet TAKE 1 TABLET(250 MG) BY MOUTH TWICE DAILY  . PROAIR HFA 108 (90 Base) MCG/ACT inhaler INHALE 2 PUFFS INTO THE LUNGS EVERY 6 HOURS AS NEEDED FOR WHEEZING OR SHORTNESS OF BREATH  . rosuvastatin (CRESTOR) 20 MG tablet Take 1 tablet (20 mg total) by mouth 3 (three) times a week. (Patient taking differently: Take 20 mg by mouth 3 (three) times a week. MWF)   No current facility-administered medications on file prior to visit.    Allergies:  Allergies  Allergen Reactions  . Baclofen Swelling  . Bactrim [Sulfamethoxazole-Trimethoprim] Nausea And Vomiting  . Ibuprofen Rash    Mouth swelling  . Latex  Rash    Some bandaids, some gloves; BLOOD TEST NEGATIVE  . Librium [Chlordiazepoxide] Itching    Dizziness   . Naprosyn [Naproxen] Rash    Mouth swelling  . Other Rash    [place] nuts - mouth swelling    Social History:  Social History   Socioeconomic History  . Marital status: Married    Spouse name: Annitta Fifield  . Number of children: 1  . Years of education: Not on file  . Highest education level: Some college, no degree  Occupational History  . Occupation: retired  Tobacco Use  . Smoking status: Former Smoker    Years: 56.00    Types: Cigarettes    Quit date: 07/14/2016    Years since quitting: 4.0  . Smokeless tobacco: Never Used  Vaping Use  . Vaping Use: Former  Substance and Sexual Activity  . Alcohol use: Yes    Alcohol/week: 0.0 standard drinks  . Drug use: No  . Sexual activity: Not Currently    Birth  control/protection: Post-menopausal  Other Topics Concern  . 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 Difficulty of Paying Living Expenses: Not hard at all  Food Insecurity: No Food Insecurity  . Worried About Charity fundraiser in the Last Year: Never true  . Ran Out of Food in the Last Year: Never true  Transportation Needs: No Transportation Needs  . Lack of Transportation (Medical): No  . Lack of Transportation (Non-Medical): No  Physical Activity: Inactive  . Days of Exercise per Week: 0 days  . Minutes of Exercise per Session: 0 min  Stress: No Stress Concern Present  . Feeling of Stress : Not at all  Social Connections:   . Frequency of Communication with Friends and Family: Not on file  . Frequency of Social Gatherings with Friends and Family: Not on file  . Attends Religious Services: Not on file  . Active Member of Clubs or Organizations: Not on file  . Attends Archivist Meetings: Not on file  . Marital Status: Not on file  Intimate Partner Violence:   . Fear of Current or Ex-Partner: Not on file  . Emotionally Abused: Not on file  . Physically Abused: Not on file  . Sexually Abused: Not on file   Social History   Tobacco Use  Smoking Status Former Smoker  . Years: 56.00  . Types: Cigarettes  . Quit date: 07/14/2016  . Years since quitting: 4.0  Smokeless Tobacco Never Used   Social History   Substance and Sexual Activity  Alcohol Use Yes  . Alcohol/week: 0.0 standard drinks    Family History:  Family History  Problem Relation Age of Onset  . Diabetes Mother   . Heart disease Mother   . Stroke Mother   . Stroke Maternal Grandmother     Past medical history, surgical history, medications, allergies, family history and social history reviewed with patient today and changes made to appropriate areas of the chart.   Review of Systems  - negative All other ROS negative except what is listed above and in the HPI.      Objective:    BP 120/64 (BP Location: Left Arm, Patient Position: Sitting, Cuff Size: Normal)   Pulse 87   Temp 98.2 F (36.8 C) (Oral)   Ht 5' 9"  (1.753 m)   Wt 218 lb  6.4 oz (99.1 kg)   LMP  (LMP Unknown)   SpO2 97%   BMI 32.25 kg/m   Wt Readings from Last 3 Encounters:  07/17/20 218 lb 6.4 oz (99.1 kg)  06/28/20 220 lb (99.8 kg)  06/17/20 221 lb (100.2 kg)    Physical Exam Vitals and nursing note reviewed.  Constitutional:      General: She is awake. She is not in acute distress.    Appearance: She is well-developed and well-groomed. She is obese. She is not ill-appearing.  HENT:     Head: Normocephalic and atraumatic.     Right Ear: Hearing, tympanic membrane, ear canal and external ear normal. No drainage.     Left Ear: Hearing, tympanic membrane, ear canal and external ear normal. No drainage.     Nose: Nose normal.     Right Sinus: No maxillary sinus tenderness or frontal sinus tenderness.     Left Sinus: No maxillary sinus tenderness or frontal sinus tenderness.     Mouth/Throat:     Mouth: Mucous membranes are moist.     Pharynx: Oropharynx is clear. Uvula midline. No pharyngeal swelling, oropharyngeal exudate or posterior oropharyngeal erythema.  Eyes:     General: Lids are normal.        Right eye: No discharge.        Left eye: No discharge.     Extraocular Movements: Extraocular movements intact.     Conjunctiva/sclera: Conjunctivae normal.     Pupils: Pupils are equal, round, and reactive to light.     Visual Fields: Right eye visual fields normal and left eye visual fields normal.  Neck:     Thyroid: No thyromegaly.     Vascular: No carotid bruit.     Trachea: Trachea normal.  Cardiovascular:     Rate and Rhythm: Normal rate and regular rhythm.     Heart sounds: Normal heart sounds. No murmur heard.  No gallop.   Pulmonary:     Effort: Pulmonary effort is normal. No  accessory muscle usage or respiratory distress.     Breath sounds: Normal breath sounds.  Chest:     Comments: Deferred at patient request Abdominal:     General: Bowel sounds are normal.     Palpations: Abdomen is soft. There is no hepatomegaly or splenomegaly.     Tenderness: There is no abdominal tenderness.  Musculoskeletal:        General: Normal range of motion.     Cervical back: Normal range of motion and neck supple.     Right lower leg: No edema.     Left lower leg: No edema.  Lymphadenopathy:     Head:     Right side of head: No submental, submandibular, tonsillar, preauricular or posterior auricular adenopathy.     Left side of head: No submental, submandibular, tonsillar, preauricular or posterior auricular adenopathy.     Cervical: No cervical adenopathy.  Skin:    General: Skin is warm and dry.     Capillary Refill: Capillary refill takes less than 2 seconds.     Findings: No rash.     Comments: Compression hose in place  Neurological:     Mental Status: She is alert and oriented to person, place, and time.     Cranial Nerves: Cranial nerves are intact.     Gait: Gait is intact.     Deep Tendon Reflexes: Reflexes are normal and symmetric.     Reflex Scores:  Brachioradialis reflexes are 2+ on the right side and 2+ on the left side.      Patellar reflexes are 2+ on the right side and 2+ on the left side. Psychiatric:        Attention and Perception: Attention normal.        Mood and Affect: Mood normal.        Speech: Speech normal.        Behavior: Behavior normal. Behavior is cooperative.        Thought Content: Thought content normal.        Judgment: Judgment normal.    Results for orders placed or performed in visit on 07/17/20  CUP PACEART REMOTE DEVICE CHECK  Result Value Ref Range   Date Time Interrogation Session [id number]    Pulse Generator Manufacturer MERM    Pulse Gen Model TFT73 Reveal LINQ    Pulse Gen Serial Number [id number]     Clinic Name [facility]    Implantable Pulse Generator Type ICM/ILR    Implantable Pulse Generator Implant Date [id number]       Assessment & Plan:   Problem List Items Addressed This Visit      Cardiovascular and Mediastinum   Aortic atherosclerosis (HCC)    Chronic, ongoing.  Noted on CT scan 04/19/2019.  Continue statin for prevention and Plavix.  Monitor closely.      Aneurysm of descending thoracic aorta (HCC)    Followed by vascular, last CT scan showed no significant increase in size.  Due for CT scan lung upcoming.  Continue to collaborate with vascular.      Senile purpura (HCC)    On daily Plavix.  Recommend cleansing skin with gentle cleanser and use of daily lotion.  Monitor for skin breakdown and address if present.        Respiratory   Centrilobular emphysema (HCC)    Chronic, ongoing.  Continue current medication regimen and adjust as needed.  May benefit from change to [facility] for once daily dosing and LAMA/LABA combo.  Obtain spirometry next visit. Will reach out to [facility] Pharm[facility] about patient concerns as enters donut hole.  Continue annual lung screening.  Return in 6 months.      Relevant Orders   CBC with Differential/Platelet     Digestive   GERD (gastroesophageal reflux disease)    Chronic, stable with Protonix.  Continue current medication regimen and adjust as needed.  Mag level next visit.      Relevant Medications   pantoprazole (PROTONIX) 40 MG tablet     Genitourinary   Chronic kidney disease, stage 3 (HCC)    Chronic, stable with no decline on recent labs.  Renal dose medications as needed. CMP today.  For worsening would refer to nephrology.      Relevant Orders   Comprehensive metabolic panel     Other   Anxiety    Chronic, stable, only uses PRN.  Continue daily Citalopram and monitor.  Adjust dose or medication as needed, do not increase Celexa to 40 MG due to patient age >29.  Denies SI/HI.      Lymphedema    Chronic, ongoing.  Continue  massage clinic visits and continued use of compression hose daily.  Continue collaboration with vascular team.  Appreciate their input.  Return in 6 months or sooner if s/s infection.      Vitamin B12 deficiency    Chronic, stable.  Continue monthly injections, recheck B12 level today.  Relevant Orders   Vitamin B12   Vitamin D deficiency    Ongoing.  Recheck Vitamin D level today.  Recommend continue daily Vitamin D3 supplement, 1000 units.      Relevant Orders   VITAMIN D 25 Hydroxy (Vit-D Deficiency, Fractures)   Prediabetes    Recent A1C at diabetes level, but patient wished to focus on diet and avoid starting medication.  Recheck A1C today and start medication as needed.      Hyperlipidemia    Chronic, ongoing. Continue Rosuvastatin 20 MG three days a week, tolerating.  If muscle cramps present then discussed with patient to try once a week and if ongoing muscle pain to alert PCP and would then consider PSK9 -- educated her on this.  Check lipid panel and CMP today.      Relevant Orders   Lipid Panel w/o Chol/HDL Ratio   TSH   Obesity    Recommended eating smaller high protein, low fat meals more frequently and exercising 30 mins a day 5 times a week with a goal of 10-15lb weight loss in the next 3 months. Patient voiced their understanding and motivation to adhere to these recommendations.        Other Visit Diagnoses    Encounter for annual physical exam    -  Primary   Annual labs to include CBC, CMP, tSH, lipid today.   Need for influenza vaccination       Flu shot today   Relevant Orders   Flu Vaccine QUAD High Dose(Fluad) (Completed)   HgB A1c   Encounter for screening mammogram for malignant neoplasm of breast       Mammogram ordered   Relevant Orders   MM DIGITAL SCREENING BILATERAL       Follow up plan: Return in about 6 months (around 01/15/2021) for COPD, MOOD, Lymphedema, HLD -- needs spirometry.   LABORATORY TESTING:  - Pap smear: not  applicable  IMMUNIZATIONS:   - Tdap: Tetanus vaccination status reviewed: last tetanus booster within 10 years. - Influenza: Up to date - Pneumovax: Up to date - Prevnar: Up to date - HPV: Not applicable - Zostavax vaccine: Up to date  SCREENING: -Mammogram: Up to date  - Colonoscopy: Up to date  - Bone Density: Up to date  -Hearing Test: Not applicable  -Spirometry: Up to date   PATIENT COUNSELING:   Advised to take 1 mg of folate supplement per day if capable of pregnancy.   Sexuality: Discussed sexually transmitted diseases, partner selection, use of condoms, avoidance of unintended pregnancy  and contraceptive alternatives.   Advised to avoid cigarette smoking.  I discussed with the patient that most people either abstain from alcohol or drink within safe limits (<=14/week and <=4 drinks/occasion for males, <=7/weeks and <= 3 drinks/occasion for females) and that the risk for alcohol disorders and other health effects rises proportionally with the number of drinks per week and how often a drinker exceeds daily limits.  Discussed cessation/primary prevention of drug use and availability of treatment for abuse.   Diet: Encouraged to adjust caloric intake to maintain  or achieve ideal body weight, to reduce intake of dietary saturated fat and total fat, to limit sodium intake by avoiding high sodium foods and not adding table salt, and to maintain adequate dietary potassium and calcium preferably from fresh fruits, vegetables, and low-fat dairy products.    Stressed the importance of regular exercise  Injury prevention: Discussed safety belts, safety helmets, smoke detector, smoking near bedding  or upholstery.   Dental health: Discussed importance of regular tooth brushing, flossing, and dental visits.    NEXT PREVENTATIVE PHYSICAL DUE IN 1 YEAR. Return in about 6 months (around 01/15/2021) for COPD, MOOD, Lymphedema, HLD -- needs spirometry.

## 2020-07-17 NOTE — Assessment & Plan Note (Signed)
Chronic, ongoing. Continue Rosuvastatin 20 MG three days a week, tolerating.  If muscle cramps present then discussed with patient to try once a week and if ongoing muscle pain to alert PCP and would then consider PSK9 -- educated her on this.  Check lipid panel and CMP today.

## 2020-07-17 NOTE — Assessment & Plan Note (Signed)
Recommended eating smaller high protein, low fat meals more frequently and exercising 30 mins a day 5 times a week with a goal of 10-15lb weight loss in the next 3 months. Patient voiced their understanding and motivation to adhere to these recommendations.  

## 2020-07-17 NOTE — Assessment & Plan Note (Signed)
Chronic, ongoing.  Noted on CT scan 04/19/2019.  Continue statin for prevention and Plavix.  Monitor closely. 

## 2020-07-17 NOTE — Assessment & Plan Note (Signed)
Followed by vascular, last CT scan showed no significant increase in size.  Due for CT scan lung upcoming.  Continue to collaborate with vascular.

## 2020-07-17 NOTE — Assessment & Plan Note (Signed)
On daily Plavix.  Recommend cleansing skin with gentle cleanser and use of daily lotion.  Monitor for skin breakdown and address if present. 

## 2020-07-17 NOTE — Patient Instructions (Signed)
Chronic Obstructive Pulmonary Disease Chronic obstructive pulmonary disease (COPD) is a long-term (chronic) lung problem. When you have COPD, it is hard for air to get in and out of your lungs. Usually the condition gets worse over time, and your lungs will never return to normal. There are things you can do to keep yourself as healthy as possible.  Your doctor may treat your condition with: ? Medicines. ? Oxygen. ? Lung surgery.  Your doctor may also recommend: ? Rehabilitation. This includes steps to make your body work better. It may involve a team of specialists. ? Quitting smoking, if you smoke. ? Exercise and changes to your diet. ? Comfort measures (palliative care). Follow these instructions at home: Medicines  Take over-the-counter and prescription medicines only as told by your doctor.  Talk to your doctor before taking any cough or allergy medicines. You may need to avoid medicines that cause your lungs to be dry. Lifestyle  If you smoke, stop. Smoking makes the problem worse. If you need help quitting, ask your doctor.  Avoid being around things that make your breathing worse. This may include smoke, chemicals, and fumes.  Stay active, but remember to rest as well.  Learn and use tips on how to relax.  Make sure you get enough sleep. Most adults need at least 7 hours of sleep every night.  Eat healthy foods. Eat smaller meals more often. Rest before meals. Controlled breathing Learn and use tips on how to control your breathing as told by your doctor. Try:  Breathing in (inhaling) through your nose for 1 second. Then, pucker your lips and breath out (exhale) through your lips for 2 seconds.  Putting one hand on your belly (abdomen). Breathe in slowly through your nose for 1 second. Your hand on your belly should move out. Pucker your lips and breathe out slowly through your lips. Your hand on your belly should move in as you breathe out.  Controlled coughing Learn  and use controlled coughing to clear mucus from your lungs. Follow these steps: 1. Lean your head a little forward. 2. Breathe in deeply. 3. Try to hold your breath for 3 seconds. 4. Keep your mouth slightly open while coughing 2 times. 5. Spit any mucus out into a tissue. 6. Rest and do the steps again 1 or 2 times as needed. General instructions  Make sure you get all the shots (vaccines) that your doctor recommends. Ask your doctor about a flu shot and a pneumonia shot.  Use oxygen therapy and pulmonary rehabilitation if told by your doctor. If you need home oxygen therapy, ask your doctor if you should buy a tool to measure your oxygen level (oximeter).  Make a COPD action plan with your doctor. This helps you to know what to do if you feel worse than usual.  Manage any other conditions you have as told by your doctor.  Avoid going outside when it is very hot, cold, or humid.  Avoid people who have a sickness you can catch (contagious).  Keep all follow-up visits as told by your doctor. This is important. Contact a doctor if:  You cough up more mucus than usual.  There is a change in the color or thickness of the mucus.  It is harder to breathe than usual.  Your breathing is faster than usual.  You have trouble sleeping.  You need to use your medicines more often than usual.  You have trouble doing your normal activities such as getting dressed   or walking around the house. Get help right away if:  You have shortness of breath while resting.  You have shortness of breath that stops you from: ? Being able to talk. ? Doing normal activities.  Your chest hurts for longer than 5 minutes.  Your skin color is more blue than usual.  Your pulse oximeter shows that you have low oxygen for longer than 5 minutes.  You have a fever.  You feel too tired to breathe normally. Summary  Chronic obstructive pulmonary disease (COPD) is a long-term lung problem.  The way your  lungs work will never return to normal. Usually the condition gets worse over time. There are things you can do to keep yourself as healthy as possible.  Take over-the-counter and prescription medicines only as told by your doctor.  If you smoke, stop. Smoking makes the problem worse. This information is not intended to replace advice given to you by your health care provider. Make sure you discuss any questions you have with your health care provider. Document Revised: 09/11/2017 Document Reviewed: 11/03/2016 Elsevier Patient Education  2020 Elsevier Inc.  

## 2020-07-17 NOTE — Assessment & Plan Note (Signed)
Chronic, stable with Protonix.  Continue current medication regimen and adjust as needed.  Mag level next visit.

## 2020-07-17 NOTE — Assessment & Plan Note (Signed)
Chronic, ongoing.  Continue massage clinic visits and continued use of compression hose daily.  Continue collaboration with vascular team.  Appreciate their input.  Return in 6 months or sooner if s/s infection.

## 2020-07-17 NOTE — Assessment & Plan Note (Signed)
Recent A1C at diabetes level, but patient wished to focus on diet and avoid starting medication.  Recheck A1C today and start medication as needed.

## 2020-07-17 NOTE — Progress Notes (Signed)
Former smoker, quit 07/14/16, 33.6 pack year

## 2020-07-17 NOTE — Assessment & Plan Note (Signed)
Chronic, stable.  Continue monthly injections, recheck B12 level today.

## 2020-07-17 NOTE — Assessment & Plan Note (Signed)
Chronic, stable, only uses PRN.  Continue daily Citalopram and monitor.  Adjust dose or medication as needed, do not increase Celexa to 40 MG due to patient age >57.  Denies SI/HI.

## 2020-07-17 NOTE — Assessment & Plan Note (Signed)
Chronic, stable with no decline on recent labs.  Renal dose medications as needed. CMP today.  For worsening would refer to nephrology.

## 2020-07-17 NOTE — Assessment & Plan Note (Signed)
Ongoing.  Recheck Vitamin D level today.  Recommend continue daily Vitamin D3 supplement, 1000 units.

## 2020-07-18 ENCOUNTER — Other Ambulatory Visit: Payer: Self-pay | Admitting: Nurse Practitioner

## 2020-07-18 ENCOUNTER — Ambulatory Visit: Payer: Medicare Other | Attending: Nurse Practitioner | Admitting: Occupational Therapy

## 2020-07-18 DIAGNOSIS — E559 Vitamin D deficiency, unspecified: Secondary | ICD-10-CM

## 2020-07-18 DIAGNOSIS — I890 Lymphedema, not elsewhere classified: Secondary | ICD-10-CM | POA: Insufficient documentation

## 2020-07-18 LAB — CBC WITH DIFFERENTIAL/PLATELET
Basophils Absolute: 0 10*3/uL (ref 0.0–0.2)
Basos: 0 %
EOS (ABSOLUTE): 0.1 10*3/uL (ref 0.0–0.4)
Eos: 1 %
Hematocrit: 43.6 % (ref 34.0–46.6)
Hemoglobin: 14.4 g/dL (ref 11.1–15.9)
Immature Grans (Abs): 0 10*3/uL (ref 0.0–0.1)
Immature Granulocytes: 0 %
Lymphocytes Absolute: 1.4 10*3/uL (ref 0.7–3.1)
Lymphs: 20 %
MCH: 29.5 pg (ref 26.6–33.0)
MCHC: 33 g/dL (ref 31.5–35.7)
MCV: 89 fL (ref 79–97)
Monocytes Absolute: 0.7 10*3/uL (ref 0.1–0.9)
Monocytes: 10 %
Neutrophils Absolute: 5 10*3/uL (ref 1.4–7.0)
Neutrophils: 69 %
Platelets: 201 10*3/uL (ref 150–450)
RBC: 4.88 x10E6/uL (ref 3.77–5.28)
RDW: 12.4 % (ref 11.7–15.4)
WBC: 7.2 10*3/uL (ref 3.4–10.8)

## 2020-07-18 LAB — COMPREHENSIVE METABOLIC PANEL
ALT: 23 IU/L (ref 0–32)
AST: 20 IU/L (ref 0–40)
Albumin/Globulin Ratio: 2.2 (ref 1.2–2.2)
Albumin: 4.4 g/dL (ref 3.7–4.7)
Alkaline Phosphatase: 74 IU/L (ref 44–121)
BUN/Creatinine Ratio: 12 (ref 12–28)
BUN: 12 mg/dL (ref 8–27)
Bilirubin Total: 0.4 mg/dL (ref 0.0–1.2)
CO2: 24 mmol/L (ref 20–29)
Calcium: 9.1 mg/dL (ref 8.7–10.3)
Chloride: 105 mmol/L (ref 96–106)
Creatinine, Ser: 0.97 mg/dL (ref 0.57–1.00)
GFR calc Af Amer: 67 mL/min/{1.73_m2} (ref >59)
GFR calc non Af Amer: 58 mL/min/{1.73_m2} — ABNORMAL LOW (ref >59)
Globulin, Total: 2 g/dL (ref 1.5–4.5)
Glucose: 132 mg/dL — ABNORMAL HIGH (ref 65–99)
Potassium: 4.2 mmol/L (ref 3.5–5.2)
Sodium: 144 mmol/L (ref 134–144)
Total Protein: 6.4 g/dL (ref 6.0–8.5)

## 2020-07-18 LAB — LIPID PANEL W/O CHOL/HDL RATIO
Cholesterol, Total: 150 mg/dL (ref 100–199)
HDL: 59 mg/dL (ref >39)
LDL Chol Calc (NIH): 66 mg/dL (ref 0–99)
Triglycerides: 146 mg/dL (ref 0–149)
VLDL Cholesterol Cal: 25 mg/dL (ref 5–40)

## 2020-07-18 LAB — HEMOGLOBIN A1C
Est. average glucose Bld gHb Est-mCnc: 140 mg/dL
Hgb A1c MFr Bld: 6.5 % — ABNORMAL HIGH (ref 4.8–5.6)

## 2020-07-18 LAB — VITAMIN D 25 HYDROXY (VIT D DEFICIENCY, FRACTURES): Vit D, 25-Hydroxy: 15.4 ng/mL — ABNORMAL LOW (ref 30.0–100.0)

## 2020-07-18 LAB — VITAMIN B12: Vitamin B-12: >2000 pg/mL — ABNORMAL HIGH (ref 232–1245)

## 2020-07-18 LAB — TSH: TSH: 1.26 u[IU]/mL (ref 0.450–4.500)

## 2020-07-18 MED ORDER — CHOLECALCIFEROL 1.25 MG (50000 UT) PO TABS: 1.0000 | ORAL_TABLET | ORAL | 0 refills | Status: DC

## 2020-07-18 NOTE — Therapy (Signed)
[place] MAIN [facility] SERVICES [address] [place], [name], [address] Phone: [phone]   Fax:  [phone]  Occupational Therapy Treatment  Patient Details  Name: Sharon [name] MRN: [id number] Date of Birth: [birth year] Referring Provider (OT): Eulogio Ditch, NP   Encounter Date: 07/18/2020   OT End of Session - 07/18/20 1613    Visit Number 0    Number of Visits 36    Date for OT Re-Evaluation 09/24/20    OT Start Time 0305    OT Stop Time 0405    OT Time Calculation (min) 60 min    Equipment Utilized During Treatment Juzo friction mat    Activity Tolerance Patient tolerated treatment well;No increased pain    Behavior During Therapy WFL for tasks assessed/performed           Past Medical History:  Diagnosis Date  . Anxiety   . Arthritis    hands, upper back  . Asthma   . COPD (chronic obstructive pulmonary disease) ([place])   . History of cervical cancer   . Menopausal disorder   . Osteoporosis   . Pneumonia 1960  . Spasm of abdominal muscles of right side    intermittent  . TMJ (dislocation of temporomandibular joint)   . Wears dentures    partial lower    Past Surgical History:  Procedure Laterality Date  . ABDOMINAL HYSTERECTOMY  1970's  . bladder botox  2005  . BLADDER SUSPENSION  2004  . CATARACT EXTRACTION W/PHACO Right 03/09/2018   Procedure: CATARACT EXTRACTION PHACO AND INTRAOCULAR LENS PLACEMENT ([place]) right;  Surgeon: Eulogio Bear, MD;  Location: [place];  Service: Ophthalmology;  Laterality: Right;  CALL CELL 1ST  . CATARACT EXTRACTION W/PHACO Left 04/19/2018   Procedure: CATARACT EXTRACTION PHACO AND INTRAOCULAR LENS PLACEMENT (IOC)  LEFT;  Surgeon: Eulogio Bear, MD;  Location: [place];  Service: Ophthalmology;  Laterality: Left;  . COLONOSCOPY WITH PROPOFOL N/A 12/13/2015   Procedure: COLONOSCOPY WITH PROPOFOL;  Surgeon: Lucilla Lame, MD;  Location: [place];  Service:  Endoscopy;  Laterality: N/A;  . LOOP RECORDER INSERTION N/A 01/07/2018   Procedure: LOOP RECORDER INSERTION;  Surgeon: Deboraha Sprang, MD;  Location: [place] [facility];  Service: Cardiovascular;  Laterality: N/A;  . POLYPECTOMY N/A 12/13/2015   Procedure: POLYPECTOMY;  Surgeon: Lucilla Lame, MD;  Location: [place];  Service: Endoscopy;  Laterality: N/A;  SIGMOID COLON POLYPS X  5  . SHOULDER ARTHROSCOPY W/ ROTATOR CUFF REPAIR Right 1998  . TEE WITHOUT CARDIOVERSION N/A 01/06/2018   Procedure: TRANSESOPHAGEAL ECHOCARDIOGRAM (TEE);  Surgeon: Minna Merritts, MD;  Location: [facility];  Service: Cardiovascular;  Laterality: N/A;  . TONSILLECTOMY AND ADENOIDECTOMY      There were no vitals filed for this visit.   Subjective Assessment - 07/18/20 1512    Subjective  Sharon [name] returns today for OT visit 35of 36 to address BLE Lymphedema. Pt presents with compression garments in place bilaterally. She reports 6/10 R leg pain today "They ache so bad."    Pertinent History Hx anxiety, COPD, OA, Asthma, hx cervical ca, osteoporosis, hx pneumonia, hx CVA, hx LLE cellulitis, hx L leg wound, hx falls    Limitations chronic BLE leg swelling and associated pain, decreased balance, fall risk, decreased standing, walking and sitting tolerance, difficulty w/ lower body bathing and dressing, difficulty fitting LB clothing and shoes, unable to drive, impaired transfers and functional mobility, impaired social participation, impaired  ability to perform household chores, cooking, meal prep    Pain Onset More than a month ago                        OT Treatments/Exercises (OP) - 07/18/20 0001      ADLs   ADL Education Given Yes      Manual Therapy   Manual Therapy Edema management;Manual Lymphatic Drainage (MLD)    Manual Lymphatic Drainage (MLD) MLD to RLE as established. No strokes to R leg in area of cat scratches, but strokes above and lateral/ medical    Compression Bandaging Pt  donned compression garment after session.                  OT Education - 07/18/20 1612    Education Details Continued skilled Pt/caregiver education  And LE ADL training throughout visit for lymphedema self care/ home program, including compression wrapping, compression garment and device wear/care, lymphatic pumping ther ex, simple self-MLD, and skin care. Discussed progress towards goals.    Person(s) Educated Patient    Methods Explanation;Demonstration    Comprehension Verbalized understanding;Returned demonstration               OT Long Term Goals - 04/11/20 1628      OT LONG TERM GOAL #1   Title Pt will demonstrate understanding of lymphedema (LE) precautions / prevention principals, including signs / symptoms of cellulitis infection with modified independence using LE Workbook as printed reference to identify 6 precautions without verbal cues by end of 3rd  OT Rx visit.     Time 3    Period Days    Status Achieved      OT LONG TERM GOAL #2   Title Pt will be able todon and doff existing compression garments independently and without assistive devices and/ or extra time for optimal lymphedema self management over time and to limit infection risk.    Baseline supervision    Time 4    Period Days    Status Achieved      OT LONG TERM GOAL #3   Title Pt to achieve at least  10% limb volume reduction in affected limb(s)  bilaterally during Intensive Phase CDT to control limb swelling, to improve tissue integrity and immune function, to improve ADLs performance and to improve functional mobility/ transfer, and to improve body image and self-esteem.    Baseline max A    Time 8    Period Weeks    Status On-going   decreased 1.05% on 02/15/20     OT LONG TERM GOAL #4   Title Pt will achieve 100% compliance with daily LE self-care home program components, including daily  skin care, simple self-MLD, gradient compression wraps/ compression garments/devices, and therapeutic  exercise with Mod CG support to ensure optimal Intensive Phase limb volume reduction to expedite compression garment/ device fitting.    Baseline 50% compliant    Time 12    Period Weeks    Status Achieved      OT LONG TERM GOAL #5   Title Pt will use appropriate  assistive devices during LE self-care training to don compression garments/devices with modified independence to ensure optimal LE self-management over time and to limit progression of chronic LE.    Baseline min A    Time 12    Period Weeks    Status Achieved   DC goal     OT LONG TERM GOAL #6  Title Pt will retain optimal limb volume reductions achieved during Intensive Phase CDT with no more than 3% volume increase with ongoing CG assistance to limit LE progression and further functional decline.    Baseline min A    Time 6    Period Months    Status On-going                 Plan - 07/18/20 1623    Clinical Impression Statement Pt tolerated MLD with deep bliateral fibrosis techniques to RLE today without increased pain. Scratches on anterior leg  are healing well with current compression garments.Reviewed friction gloves and mat to assist w/ donning garments. Cont as per POC.           Patient will benefit from skilled therapeutic intervention in order to improve the following deficits and impairments:           Visit Diagnosis: Lymphedema, not elsewhere classified    Problem List Patient Active Problem List   Diagnosis Date Noted  . Neuropathy of both feet 05/24/2020  . Hyperlipidemia 02/14/2020  . Obesity 02/14/2020  . Vitamin D deficiency 06/28/2019  . Prediabetes 06/28/2019  . GERD (gastroesophageal reflux disease) 12/07/2018  . Vitamin B12 deficiency 12/05/2018  . Senile purpura ([place]) 07/28/2018  . Lymphedema 07/14/2018  . Aneurysm of descending thoracic aorta (HCC) 05/04/2018  . Allergic rhinitis 02/16/2018  . Aortic atherosclerosis ([place]) 01/13/2018  . Status post placement of  implantable loop recorder 01/12/2018  . Chronic kidney disease, stage 3 ([place]) 01/12/2018  . History of CVA (cerebrovascular accident)   . TIA (transient ischemic attack) 01/04/2018  . CAD (coronary artery disease) 11/25/2017  . Caregiver stress 11/25/2017  . Menopause 08/14/2017  . De Quervain's tenosynovitis, right 07/17/2017  . Stress incontinence 06/19/2017  . Personal history of tobacco use, presenting hazards to health 04/01/2017  . Anxiety 03/13/2017  . Advanced care planning/counseling discussion 10/17/2016  . Benign neoplasm of sigmoid colon   . Centrilobular emphysema ([place]) 10/17/2015  . Mass of upper inner quadrant of right breast 06/22/2015    Andrey Spearman, MS, OTR/L, [facility] 07/18/20 4:26 PM  [place] MAIN [facility] SERVICES [address] [place], [name], [address] Phone: [phone]   Fax:  [phone]  Name: Sharon [name] MRN: [id number] Date of Birth: 01/18/1946

## 2020-07-18 NOTE — Progress Notes (Signed)
Contacted via [place] afternoon [facility], your labs have returned: - CBC shows no anemia or concerns - Kidney function continues to show some very mild kidney disease, but this is stable and not decreasing, this is good news.  Thyroid is normal. - Cholesterol levels are good with your current statin routine. - B12 level is normal, but vitamin D is low.  I am going to send in a higher dose that you will start taking weekly for this and we can recheck level via outpatient labs in 6 weeks. - A1C continues showing diabetic range at 6.5%, had no increased.  Would you like to start a low dose of Metformin for this or continue diet focus?  We will recheck next visit. Any questions? Keep being awesome!!  Thank you for allowing me to participate in your care. Kindest regards, Anicka Stuckert

## 2020-07-20 ENCOUNTER — Other Ambulatory Visit: Payer: Self-pay | Admitting: Nurse Practitioner

## 2020-07-20 MED ORDER — CHOLECALCIFEROL 1.25 MG (50000 UT) PO TABS: 1.0000 | ORAL_TABLET | ORAL | 0 refills | Status: DC

## 2020-07-20 MED ORDER — METFORMIN HCL 500 MG PO TABS: 500.0000 mg | ORAL_TABLET | Freq: Every day | ORAL | 3 refills | Status: DC

## 2020-07-20 NOTE — Progress Notes (Signed)
Carelink Summary Report / Loop Recorder 

## 2020-07-24 ENCOUNTER — Ambulatory Visit: Payer: Medicare Other | Admitting: Occupational Therapy

## 2020-07-26 ENCOUNTER — Ambulatory Visit: Payer: Medicare Other | Admitting: Occupational Therapy

## 2020-07-26 ENCOUNTER — Other Ambulatory Visit: Payer: Self-pay

## 2020-07-26 DIAGNOSIS — I890 Lymphedema, not elsewhere classified: Secondary | ICD-10-CM | POA: Diagnosis not present

## 2020-07-26 NOTE — Therapy (Signed)
[place] MAIN [facility] SERVICES [address] [place], [name], [address] Phone: [phone]   Fax:  [phone]  Occupational Therapy Treatment  Patient Details  Name: Sharon [name] MRN: [id number] Date of Birth: January 11, 1946 Referring Provider (OT): Eulogio Ditch, NP   Encounter Date: 07/26/2020   OT End of Session - 07/26/20 1519    Visit Number 37    Number of Visits 43    Date for OT Re-Evaluation 09/24/20    OT Start Time 0315    OT Stop Time 0415    OT Time Calculation (min) 60 min    Activity Tolerance Patient tolerated treatment well;No increased pain    Behavior During Therapy WFL for tasks assessed/performed           Past Medical History:  Diagnosis Date   Anxiety    Arthritis    hands, upper back   Asthma    COPD (chronic obstructive pulmonary disease) (HCC)    History of cervical cancer    Menopausal disorder    Osteoporosis    Pneumonia 1960   Spasm of abdominal muscles of right side    intermittent   TMJ (dislocation of temporomandibular joint)    Wears dentures    partial lower    Past Surgical History:  Procedure Laterality Date   ABDOMINAL HYSTERECTOMY  1970's   bladder botox  2005   BLADDER SUSPENSION  2004   CATARACT EXTRACTION W/PHACO Right 03/09/2018   Procedure: CATARACT EXTRACTION PHACO AND INTRAOCULAR LENS PLACEMENT ([place]) right;  Surgeon: Eulogio Bear, MD;  Location: [place];  Service: Ophthalmology;  Laterality: Right;  CALL CELL 1ST   CATARACT EXTRACTION W/PHACO Left 04/19/2018   Procedure: CATARACT EXTRACTION PHACO AND INTRAOCULAR LENS PLACEMENT (IOC)  LEFT;  Surgeon: Eulogio Bear, MD;  Location: [place];  Service: Ophthalmology;  Laterality: Left;   COLONOSCOPY WITH PROPOFOL N/A 12/13/2015   Procedure: COLONOSCOPY WITH PROPOFOL;  Surgeon: Lucilla Lame, MD;  Location: [place];  Service: Endoscopy;  Laterality: N/A;   LOOP RECORDER  INSERTION N/A 01/07/2018   Procedure: LOOP RECORDER INSERTION;  Surgeon: Deboraha Sprang, MD;  Location: [place] [facility];  Service: Cardiovascular;  Laterality: N/A;   POLYPECTOMY N/A 12/13/2015   Procedure: POLYPECTOMY;  Surgeon: Lucilla Lame, MD;  Location: [place];  Service: Endoscopy;  Laterality: N/A;  SIGMOID COLON POLYPS X  5   SHOULDER ARTHROSCOPY W/ ROTATOR CUFF REPAIR Right 1998   TEE WITHOUT CARDIOVERSION N/A 01/06/2018   Procedure: TRANSESOPHAGEAL ECHOCARDIOGRAM (TEE);  Surgeon: Minna Merritts, MD;  Location: [facility];  Service: Cardiovascular;  Laterality: N/A;   TONSILLECTOMY AND ADENOIDECTOMY      There were no vitals filed for this visit.   Subjective Assessment - 07/26/20 1519    Subjective  Mrs Olds returns today for OT visit 52 of 34 to address BLE Lymphedema. Pt presents with compression garments in place bilaterally. Pt reports increased foot and leg swelling over the past week .    Pertinent History Hx anxiety, COPD, OA, Asthma, hx cervical ca, osteoporosis, hx pneumonia, hx CVA, hx LLE cellulitis, hx L leg wound, hx falls    Limitations chronic BLE leg swelling and associated pain, decreased balance, fall risk, decreased standing, walking and sitting tolerance, difficulty w/ lower body bathing and dressing, difficulty fitting LB clothing and shoes, unable to drive, impaired transfers and functional mobility, impaired social participation, impaired ability to perform household chores, cooking, meal prep  Pain Onset More than a month ago                        OT Treatments/Exercises (OP) - 07/26/20 0001      ADLs   ADL Education Given Yes      Manual Therapy   Manual Therapy Edema management;Manual Lymphatic Drainage (MLD)    Manual Lymphatic Drainage (MLD) MLD to LLE as established. No strokes to R leg in area of cat scratches, but strokes above and lateral/ medical    Compression Bandaging Pt donned compression garment after  session.                  OT Education - 07/26/20 1635    Education Details Continued skilled Pt/caregiver education  And LE ADL training throughout visit for lymphedema self care/ home program, including compression wrapping, compression garment and device wear/care, lymphatic pumping ther ex, simple self-MLD, and skin care. Discussed progress towards goals.    Person(s) Educated Patient    Methods Explanation;Demonstration    Comprehension Verbalized understanding;Returned demonstration               OT Long Term Goals - 04/11/20 1628      OT LONG TERM GOAL #1   Title Pt will demonstrate understanding of lymphedema (LE) precautions / prevention principals, including signs / symptoms of cellulitis infection with modified independence using LE Workbook as printed reference to identify 6 precautions without verbal cues by end of 3rd  OT Rx visit.     Time 3    Period Days    Status Achieved      OT LONG TERM GOAL #2   Title Pt will be able todon and doff existing compression garments independently and without assistive devices and/ or extra time for optimal lymphedema self management over time and to limit infection risk.    Baseline supervision    Time 4    Period Days    Status Achieved      OT LONG TERM GOAL #3   Title Pt to achieve at least  10% limb volume reduction in affected limb(s)  bilaterally during Intensive Phase CDT to control limb swelling, to improve tissue integrity and immune function, to improve ADLs performance and to improve functional mobility/ transfer, and to improve body image and self-esteem.    Baseline max A    Time 8    Period Weeks    Status On-going   decreased 1.05% on 02/15/20     OT LONG TERM GOAL #4   Title Pt will achieve 100% compliance with daily LE self-care home program components, including daily  skin care, simple self-MLD, gradient compression wraps/ compression garments/devices, and therapeutic exercise with Mod CG support to  ensure optimal Intensive Phase limb volume reduction to expedite compression garment/ device fitting.    Baseline 50% compliant    Time 12    Period Weeks    Status Achieved      OT LONG TERM GOAL #5   Title Pt will use appropriate  assistive devices during LE self-care training to don compression garments/devices with modified independence to ensure optimal LE self-management over time and to limit progression of chronic LE.    Baseline min A    Time 12    Period Weeks    Status Achieved   DC goal     OT LONG TERM GOAL #6   Title Pt will retain optimal limb volume reductions achieved during  Intensive Phase CDT with no more than 3% volume increase with ongoing CG assistance to limit LE progression and further functional decline.    Baseline min A    Time 6    Period Months    Status On-going                 Plan - 07/26/20 1636    Clinical Impression Statement Pt tolerated BLE MLD using established techniques without increased pain. R anterior leg wounds/ cat scratches x 2, are closed and healing well. Signs/ sy,mptoms of infection are absent. Provided Max A to don bilateral compression garments after session to maximize treatment time. Cont as per POC.           Patient will benefit from skilled therapeutic intervention in order to improve the following deficits and impairments:           Visit Diagnosis: Lymphedema, not elsewhere classified    Problem List Patient Active Problem List   Diagnosis Date Noted   Neuropathy of both feet 05/24/2020   Hyperlipidemia 02/14/2020   Obesity 02/14/2020   Vitamin D deficiency 06/28/2019   Prediabetes 06/28/2019   GERD (gastroesophageal reflux disease) 12/07/2018   Vitamin B12 deficiency 12/05/2018   Senile purpura ([place]) 07/28/2018   Lymphedema 07/14/2018   Aneurysm of descending thoracic aorta ([place]) 05/04/2018   Allergic rhinitis 02/16/2018   Aortic atherosclerosis ([place]) 01/13/2018   Status post  placement of implantable loop recorder 01/12/2018   Chronic kidney disease, stage 3 ([place]) 01/12/2018   History of CVA (cerebrovascular accident)    TIA (transient ischemic attack) 01/04/2018   CAD (coronary artery disease) 11/25/2017   Caregiver stress 11/25/2017   Menopause 08/14/2017   De Quervain's tenosynovitis, right 07/17/2017   Stress incontinence 06/19/2017   Personal history of tobacco use, presenting hazards to health 04/01/2017   Anxiety 03/13/2017   Advanced care planning/counseling discussion 10/17/2016   Benign neoplasm of sigmoid colon    Centrilobular emphysema ([place]) 10/17/2015   Mass of upper inner quadrant of right breast 06/22/2015    Andrey Spearman, MS, OTR/L, [facility] 07/26/20 4:37 PM  [place] MAIN [facility] SERVICES [place], [name], [address] Phone: [phone]   Fax:  [phone]  Name: CARLETTE PALMATIER MRN: [id number] Date of Birth: [birth year]

## 2020-07-31 ENCOUNTER — Encounter: Payer: Self-pay | Admitting: Nurse Practitioner

## 2020-07-31 ENCOUNTER — Other Ambulatory Visit: Payer: Self-pay

## 2020-07-31 ENCOUNTER — Ambulatory Visit
Admission: RE | Admit: 2020-07-31 | Discharge: 2020-07-31 | Disposition: A | Discharge status: Home / Self Care | Payer: Medicare Other | Source: Ambulatory Visit | Attending: Oncology | Admitting: Oncology

## 2020-07-31 ENCOUNTER — Ambulatory Visit: Payer: Medicare Other | Admitting: Occupational Therapy

## 2020-07-31 ENCOUNTER — Other Ambulatory Visit: Payer: Self-pay | Admitting: Nurse Practitioner

## 2020-07-31 DIAGNOSIS — Z87891 Personal history of nicotine dependence: Secondary | ICD-10-CM | POA: Diagnosis not present

## 2020-07-31 DIAGNOSIS — I890 Lymphedema, not elsewhere classified: Secondary | ICD-10-CM

## 2020-07-31 DIAGNOSIS — Z122 Encounter for screening for malignant neoplasm of respiratory organs: Secondary | ICD-10-CM | POA: Diagnosis not present

## 2020-07-31 MED ORDER — ONETOUCH ULTRASOFT LANCETS MISC: 12 refills | Status: DC

## 2020-07-31 MED ORDER — ONETOUCH VERIO VI STRP: ORAL_STRIP | 12 refills | Status: DC

## 2020-07-31 MED ORDER — ONETOUCH VERIO W/DEVICE KIT: PACK | 0 refills | Status: AC

## 2020-07-31 NOTE — Therapy (Signed)
[address] MAIN [facility] SERVICES [address] [place], [name], [address] Phone: [phone]   Fax:  [phone]  Occupational Therapy Treatment  Patient Details  Name: Sharon [name] MRN: [id number] Date of Birth: 01-Dec-1945 Referring Provider (OT): Eulogio Ditch, NP   Encounter Date: 07/31/2020   OT End of Session - 07/31/20 1509    Visit Number 38    Number of Visits 72    Date for OT Re-Evaluation 09/24/20    OT Start Time 0303    OT Stop Time 0402    OT Time Calculation (min) 59 min    Activity Tolerance Patient tolerated treatment well;No increased pain           Past Medical History:  Diagnosis Date  . Anxiety   . Arthritis    hands, upper back  . Asthma   . COPD (chronic obstructive pulmonary disease) ([place])   . History of cervical cancer   . Menopausal disorder   . Osteoporosis   . Pneumonia 1960  . Spasm of abdominal muscles of right side    intermittent  . TMJ (dislocation of temporomandibular joint)   . Wears dentures    partial lower    Past Surgical History:  Procedure Laterality Date  . ABDOMINAL HYSTERECTOMY  1970's  . bladder botox  2005  . BLADDER SUSPENSION  2004  . CATARACT EXTRACTION W/PHACO Right 03/09/2018   Procedure: CATARACT EXTRACTION PHACO AND INTRAOCULAR LENS PLACEMENT ([place]) right;  Surgeon: Eulogio Bear, MD;  Location: [place];  Service: Ophthalmology;  Laterality: Right;  CALL CELL 1ST  . CATARACT EXTRACTION W/PHACO Left 04/19/2018   Procedure: CATARACT EXTRACTION PHACO AND INTRAOCULAR LENS PLACEMENT (IOC)  LEFT;  Surgeon: Eulogio Bear, MD;  Location: [place];  Service: Ophthalmology;  Laterality: Left;  . COLONOSCOPY WITH PROPOFOL N/A 12/13/2015   Procedure: COLONOSCOPY WITH PROPOFOL;  Surgeon: Lucilla Lame, MD;  Location: [place];  Service: Endoscopy;  Laterality: N/A;  . LOOP RECORDER INSERTION N/A 01/07/2018   Procedure: LOOP RECORDER INSERTION;   Surgeon: Deboraha Sprang, MD;  Location: [place] [facility];  Service: Cardiovascular;  Laterality: N/A;  . POLYPECTOMY N/A 12/13/2015   Procedure: POLYPECTOMY;  Surgeon: Lucilla Lame, MD;  Location: [place];  Service: Endoscopy;  Laterality: N/A;  SIGMOID COLON POLYPS X  5  . SHOULDER ARTHROSCOPY W/ ROTATOR CUFF REPAIR Right 1998  . TEE WITHOUT CARDIOVERSION N/A 01/06/2018   Procedure: TRANSESOPHAGEAL ECHOCARDIOGRAM (TEE);  Surgeon: Minna Merritts, MD;  Location: [facility];  Service: Cardiovascular;  Laterality: N/A;  . TONSILLECTOMY AND ADENOIDECTOMY      There were no vitals filed for this visit.   Subjective Assessment - 07/31/20 1508    Subjective  Sharon [name] returns today for OT visit 37 of 72 to address BLE Lymphedema. Pt presents with compression garments in place bilaterally. Pt reports increased foot and leg swelling over the past week .    Pertinent History Hx anxiety, COPD, OA, Asthma, hx cervical ca, osteoporosis, hx pneumonia, hx CVA, hx LLE cellulitis, hx L leg wound, hx falls    Limitations chronic BLE leg swelling and associated pain, decreased balance, fall risk, decreased standing, walking and sitting tolerance, difficulty w/ lower body bathing and dressing, difficulty fitting LB clothing and shoes, unable to drive, impaired transfers and functional mobility, impaired social participation, impaired ability to perform household chores, cooking, meal prep    Pain Onset More than a month ago  OT Treatments/Exercises (OP) - 07/31/20 0001      ADLs   ADL Education Given Yes      Manual Therapy   Manual Therapy Edema management;Manual Lymphatic Drainage (MLD)    Manual Lymphatic Drainage (MLD) MLD to RLE as established. No strokes to R leg in area of cat scratches, but strokes above and lateral/ medical    Compression Bandaging Pt donned compression garment after session w Max A due to time constraints.                   OT Education - 07/31/20 1642    Education Details Continued skilled Pt/caregiver education  And LE ADL training throughout visit for lymphedema self care/ home program, including compression wrapping, compression garment and device wear/care, lymphatic pumping ther ex, simple self-MLD, and skin care. Discussed progress towards goals.    Person(s) Educated Patient    Methods Explanation;Demonstration    Comprehension Verbalized understanding;Returned demonstration               OT Long Term Goals - 04/11/20 1628      OT LONG TERM GOAL #1   Title Pt will demonstrate understanding of lymphedema (LE) precautions / prevention principals, including signs / symptoms of cellulitis infection with modified independence using LE Workbook as printed reference to identify 6 precautions without verbal cues by end of 3rd  OT Rx visit.     Time 3    Period Days    Status Achieved      OT LONG TERM GOAL #2   Title Pt will be able todon and doff existing compression garments independently and without assistive devices and/ or extra time for optimal lymphedema self management over time and to limit infection risk.    Baseline supervision    Time 4    Period Days    Status Achieved      OT LONG TERM GOAL #3   Title Pt to achieve at least  10% limb volume reduction in affected limb(s)  bilaterally during Intensive Phase CDT to control limb swelling, to improve tissue integrity and immune function, to improve ADLs performance and to improve functional mobility/ transfer, and to improve body image and self-esteem.    Baseline max A    Time 8    Period Weeks    Status On-going   decreased 1.05% on 02/15/20     OT LONG TERM GOAL #4   Title Pt will achieve 100% compliance with daily LE self-care home program components, including daily  skin care, simple self-MLD, gradient compression wraps/ compression garments/devices, and therapeutic exercise with Mod CG support to ensure optimal  Intensive Phase limb volume reduction to expedite compression garment/ device fitting.    Baseline 50% compliant    Time 12    Period Weeks    Status Achieved      OT LONG TERM GOAL #5   Title Pt will use appropriate  assistive devices during LE self-care training to don compression garments/devices with modified independence to ensure optimal LE self-management over time and to limit progression of chronic LE.    Baseline min A    Time 12    Period Weeks    Status Achieved   DC goal     OT LONG TERM GOAL #6   Title Pt will retain optimal limb volume reductions achieved during Intensive Phase CDT with no more than 3% volume increase with ongoing CG assistance to limit LE progression and further functional decline.  Baseline min A    Time 6    Period Months    Status On-going                 Plan - 07/31/20 1642    Clinical Impression Statement Pt continues to c/o of leg pain bilaterally. She acknowledges that pain increases with walking and elevation, which suggests there may be a periferal arterial component in addition to vascular issues. Typically LE causes pain / discomfort described as heaviness, fullness, and tiredness, not deep aching oain that this patient reports at each visit. OT encouraged Pt to ask her provider at t her next visit at the vascular clinic f they can explain vein and vascular pain and structures and functions involved. Pt tolerated MLD today without increased pain in long sitting. Cat scratch wounds on anterior RLE continue to heal well. Redness and thickened subcutaneous fibris persist, but are more mobile after MLD. Cont as per POC.    Occupational performance deficits (Please refer to evaluation for details): ADL's;Leisure;IADL's;Social Participation;Rest and Sleep;Work    Marketing executive / Function / Physical Skills ADL;Balance;Decreased knowledge of use of DME;Decreased knowledge of precautions;Edema;Flexibility;IADL;Pain;Skin integrity;Mobility            Patient will benefit from skilled therapeutic intervention in order to improve the following deficits and impairments:   Body Structure / Function / Physical Skills: ADL, Balance, Decreased knowledge of use of DME, Decreased knowledge of precautions, Edema, Flexibility, IADL, Pain, Skin integrity, Mobility       Visit Diagnosis: Lymphedema, not elsewhere classified    Problem List Patient Active Problem List   Diagnosis Date Noted  . Neuropathy of both feet 05/24/2020  . Hyperlipidemia associated with type 2 diabetes mellitus ([place]) 02/14/2020  . Obesity 02/14/2020  . Vitamin D deficiency 06/28/2019  . Type 2 diabetes mellitus with obesity ([place]) 06/28/2019  . GERD (gastroesophageal reflux disease) 12/07/2018  . Vitamin B12 deficiency 12/05/2018  . Senile purpura ([place]) 07/28/2018  . Lymphedema 07/14/2018  . Aneurysm of descending thoracic aorta (HCC) 05/04/2018  . Allergic rhinitis 02/16/2018  . Aortic atherosclerosis ([place]) 01/13/2018  . Status post placement of implantable loop recorder 01/12/2018  . Chronic kidney disease, stage 3 ([place]) 01/12/2018  . History of CVA (cerebrovascular accident)   . TIA (transient ischemic attack) 01/04/2018  . CAD (coronary artery disease) 11/25/2017  . Caregiver stress 11/25/2017  . Menopause 08/14/2017  . De Quervain's tenosynovitis, right 07/17/2017  . Stress incontinence 06/19/2017  . Personal history of tobacco use, presenting hazards to health 04/01/2017  . Anxiety 03/13/2017  . Advanced care planning/counseling discussion 10/17/2016  . Benign neoplasm of sigmoid colon   . Centrilobular emphysema ([place]) 10/17/2015  . Mass of upper inner quadrant of right breast 06/22/2015    Andrey Spearman, MS, OTR/L, [facility] 07/31/20 4:49 PM  [place] MAIN [facility] SERVICES [address] [place], [name], [address] Phone: [phone]   Fax:  [phone]  Name: Sharon [name] MRN: [id number] Date of  Birth: 1946-06-04

## 2020-08-02 ENCOUNTER — Encounter: Payer: Self-pay | Admitting: *Deleted

## 2020-08-07 ENCOUNTER — Encounter (INDEPENDENT_AMBULATORY_CARE_PROVIDER_SITE_OTHER): Payer: Self-pay | Admitting: Nurse Practitioner

## 2020-08-07 ENCOUNTER — Other Ambulatory Visit: Payer: Self-pay

## 2020-08-07 ENCOUNTER — Ambulatory Visit (INDEPENDENT_AMBULATORY_CARE_PROVIDER_SITE_OTHER): Payer: Medicare Other | Admitting: Nurse Practitioner

## 2020-08-07 VITALS — BP 113/73 | HR 93 | Resp 16 | Wt 218.0 lb

## 2020-08-07 DIAGNOSIS — I890 Lymphedema, not elsewhere classified: Secondary | ICD-10-CM

## 2020-08-07 DIAGNOSIS — E1169 Type 2 diabetes mellitus with other specified complication: Secondary | ICD-10-CM

## 2020-08-07 DIAGNOSIS — E785 Hyperlipidemia, unspecified: Secondary | ICD-10-CM

## 2020-08-07 DIAGNOSIS — I739 Peripheral vascular disease, unspecified: Secondary | ICD-10-CM | POA: Diagnosis not present

## 2020-08-07 DIAGNOSIS — E669 Obesity, unspecified: Secondary | ICD-10-CM

## 2020-08-09 ENCOUNTER — Ambulatory Visit: Payer: Medicare Other | Admitting: Occupational Therapy

## 2020-08-12 ENCOUNTER — Encounter (INDEPENDENT_AMBULATORY_CARE_PROVIDER_SITE_OTHER): Payer: Self-pay | Admitting: Nurse Practitioner

## 2020-08-12 NOTE — Progress Notes (Signed)
Subjective:    Patient ID: Sharon [name], female    DOB: April 07, 1946, 74 y.o.   MRN: [id number] Chief Complaint  Patient presents with  . Follow-up    4monthfollow up    Patient returns today for follow-up evaluation of lower extremity lymphedema and recurrent cellulitis.  Since the patient has been placed on prophylactic antibiotic she is not had any issues with cellulitis.  She continues to have some redness of her bilateral lower extremities however is not consistent with her previous episodes of cellulitis.  The redness does cause the patient significant anxiety especially given the issue she has had with recurrent cellulitis.  The patient has also been working with occupational therapy at the lymphedema clinic and that has also been very beneficial to her lymphedema as well.  She is consistent with being active as well as utilizing her lymphedema pump.  The patient does complain of some claudication-like symptoms and is concerned about possible circulation issues.  She denies any rest pain or new lower wounds or ulcerations.  She denies any ischemic-like symptoms.   Review of Systems  Cardiovascular: Positive for leg swelling.       Claudication  All other systems reviewed and are negative.      Objective:   Physical Exam Vitals reviewed.  Cardiovascular:     Rate and Rhythm: Normal rate.     Pulses: Decreased pulses.  Pulmonary:     Effort: Pulmonary effort is normal.  Skin:    General: Skin is warm and dry.     Findings: Erythema present.  Neurological:     Mental Status: She is alert and oriented to person, place, and time.  Psychiatric:        Mood and Affect: Mood normal.        Behavior: Behavior normal.        Thought Content: Thought content normal.        Judgment: Judgment normal.     BP 113/73 (BP Location: Right Arm)   Pulse 93   Resp 16   Wt 218 lb (98.9 kg)   LMP  (LMP Unknown)   BMI 32.19 kg/m   Past Medical History:  Diagnosis Date  . Anxiety     . Arthritis    hands, upper back  . Asthma   . COPD (chronic obstructive pulmonary disease) ([place]   . History of cervical cancer   . Menopausal disorder   . Osteoporosis   . Pneumonia 1960  . Spasm of abdominal muscles of right side    intermittent  . TMJ (dislocation of temporomandibular joint)   . Wears dentures    partial lower    Social History   Socioeconomic History  . Marital status: Married    Spouse name: GZhana Jeangilles . Number of children: 1  . Years of education: Not on file  . Highest education level: Some college, no degree  Occupational History  . Occupation: retired  Tobacco Use  . Smoking status: Former Smoker    Years: 56.00    Types: Cigarettes    Quit date: 07/14/2016    Years since quitting: 4.0  . Smokeless tobacco: Never Used  Vaping Use  . Vaping Use: Former  Substance and Sexual Activity  . Alcohol use: Yes    Alcohol/week: 0.0 standard drinks  . Drug use: No  . Sexual activity: Not Currently    Birth control/protection: Post-menopausal  Other Topics Concern  . 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 Difficulty of Paying Living Expenses: Not hard at all  Food Insecurity: No Food Insecurity  . Worried About Charity fundraiser in the Last Year: Never true  . Ran Out of Food in the Last Year: Never true  Transportation Needs: No Transportation Needs  . Lack of Transportation (Medical): No  . Lack of Transportation (Non-Medical): No  Physical Activity: Inactive  . Days of Exercise per Week: 0 days  . Minutes of Exercise per Session: 0 min  Stress: No Stress Concern Present  . Feeling of Stress : Not at all  Social Connections:   . Frequency of Communication with Friends and Family: Not on file  . Frequency of Social Gatherings with Friends and Family: Not on file  . Attends Religious Services: Not on file  . Active Member of  Clubs or Organizations: Not on file  . Attends Archivist Meetings: Not on file  . Marital Status: Not on file  Intimate Partner Violence:   . Fear of Current or Ex-Partner: Not on file  . Emotionally Abused: Not on file  . Physically Abused: Not on file  . Sexually Abused: Not on file    Past Surgical History:  Procedure Laterality Date  . ABDOMINAL HYSTERECTOMY  1970's  . bladder botox  2005  . BLADDER SUSPENSION  2004  . CATARACT EXTRACTION W/PHACO Right 03/09/2018   Procedure: CATARACT EXTRACTION PHACO AND INTRAOCULAR LENS PLACEMENT ([place]) right;  Surgeon: Eulogio Bear, MD;  Location: [place];  Service: Ophthalmology;  Laterality: Right;  CALL CELL 1ST  . CATARACT EXTRACTION W/PHACO Left 04/19/2018   Procedure: CATARACT EXTRACTION PHACO AND INTRAOCULAR LENS PLACEMENT (IOC)  LEFT;  Surgeon: Eulogio Bear, MD;  Location: [place];  Service: Ophthalmology;  Laterality: Left;  . COLONOSCOPY WITH PROPOFOL N/A 12/13/2015   Procedure: COLONOSCOPY WITH PROPOFOL;  Surgeon: Lucilla Lame, MD;  Location: [place];  Service: Endoscopy;  Laterality: N/A;  . LOOP RECORDER INSERTION N/A 01/07/2018   Procedure: LOOP RECORDER INSERTION;  Surgeon: Deboraha Sprang, MD;  Location: [place] [facility];  Service: Cardiovascular;  Laterality: N/A;  . POLYPECTOMY N/A 12/13/2015   Procedure: POLYPECTOMY;  Surgeon: Lucilla Lame, MD;  Location: [place];  Service: Endoscopy;  Laterality: N/A;  SIGMOID COLON POLYPS X  5  . SHOULDER ARTHROSCOPY W/ ROTATOR CUFF REPAIR Right 1998  . TEE WITHOUT CARDIOVERSION N/A 01/06/2018   Procedure: TRANSESOPHAGEAL ECHOCARDIOGRAM (TEE);  Surgeon: Minna Merritts, MD;  Location: [facility];  Service: Cardiovascular;  Laterality: N/A;  . TONSILLECTOMY AND ADENOIDECTOMY      Family History  Problem Relation Age of Onset  . Diabetes Mother   . Heart disease Mother   . Stroke Mother   . Stroke Maternal Grandmother      Allergies  Allergen Reactions  . Baclofen Swelling  . Bactrim [Sulfamethoxazole-Trimethoprim] Nausea And Vomiting  . Ibuprofen Rash    Mouth swelling  . Latex Rash    Some bandaids, some gloves; BLOOD TEST NEGATIVE  . Librium [Chlordiazepoxide] Itching    Dizziness   . Naprosyn [Naproxen] Rash    Mouth swelling  . Other Rash    [place] nuts - mouth swelling    CBC Latest Ref Rng & Units 07/17/2020 01/26/2020 06/28/2019  WBC 3.4 - 10.8 x10E3/uL 7.2 9.0 7.7  Hemoglobin 11.1 - 15.9  g/dL 14.4 15.0 14.4  Hematocrit 34.0 - 46.6 % 43.6 43.6 44.6  Platelets 150 - 450 x10E3/uL 201 201 250      CMP     Component Value Date/Time   NA 144 07/17/2020 1509   K 4.2 07/17/2020 1509   CL 105 07/17/2020 1509   CO2 24 07/17/2020 1509   GLUCOSE 132 (H) 07/17/2020 1509   GLUCOSE 166 (H) 05/28/2019 1408   BUN 12 07/17/2020 1509   CREATININE 0.97 07/17/2020 1509   CALCIUM 9.1 07/17/2020 1509   PROT 6.4 07/17/2020 1509   ALBUMIN 4.4 07/17/2020 1509   AST 20 07/17/2020 1509   ALT 23 07/17/2020 1509   ALKPHOS 74 07/17/2020 1509   BILITOT 0.4 07/17/2020 1509   GFRNONAA 58 (L) 07/17/2020 1509   GFRAA 67 07/17/2020 1509     @   Assessment & Plan:   1. Lymphedema  No surgery or intervention at this point in time.    I have reviewed my discussion with the patient regarding lymphedema and why it  causes symptoms.  Patient will continue wearing graduated compression stockings class 1 (20-30 mmHg) on a daily basis a prescription was given. The patient is reminded to put the stockings on first thing in the morning and removing them in the evening. The patient is instructed specifically not to sleep in the stockings.   In addition, behavioral modification throughout the day will be continued.  This will include frequent elevation (such as in a recliner), use of over the counter pain medications as needed and exercise such as walking.  I have reviewed systemic causes for chronic edema such as  liver, kidney and cardiac etiologies and there does not appear to be any significant changes in these organ systems over the past year.  The patient is under the impression that these organ systems are all stable and unchanged.    The patient will continue aggressive use of the  lymph pump.  This will continue to improve the edema control and prevent sequela such as ulcers and infections.   The patient will follow-up with me in 6 months    2. Hyperlipidemia associated with type 2 diabetes mellitus ([place]) Continue statin as ordered and reviewed, no changes at this time   3. Type 2 diabetes mellitus with obesity ([place]) Continue hypoglycemic medications as already ordered, these medications have been reviewed and there are no changes at this time.  Hgb A1C to be monitored as already arranged by primary service   4. Claudication [facility]) Patient does have risk factors for peripheral arterial disease.  I have an abundance of caution we will perform an aortoiliac duplex to evaluate for possible stenosis.  Previous ABIs were unrevealing.  Also discussed with patient this could be related to degenerative disc disease and issues with her lumbar spine.  In which case we would refer her to primary care for further work-up.  Patient will follow up at her convenience. - VAS US AORTA/IVC/ILIACS; Future   Current Outpatient Medications on File Prior to Visit  Medication Sig Dispense Refill  . acetaminophen (TYLENOL) 500 MG tablet Take 500 mg by mouth every 6 (six) hours as needed.    . Blood Glucose Monitoring Suppl (ONETOUCH VERIO) w/Device KIT Utilize to check blood sugar twice a day, fasting in morning with goal < 130 and then 2 hours after a meal with goal <180.  Document and bring to visits. 1 kit 0  . budesonide-formoterol (SYMBICORT) 160-4.5 MCG/ACT inhaler INHALE 2 PUFFS  BY MOUTH TWICE DAILY 10.2 g 2  . Cholecalciferol 1.25 MG (50000 UT) TABS Take 1 tablet by mouth once a week. For 8 weeks and then  stop.  Return to office for lab draw. 8 tablet 0  . citalopram (CELEXA) 20 MG tablet Take 1 tablet (20 mg total) by mouth daily. 90 tablet 3  . clopidogrel (PLAVIX) 75 MG tablet TAKE 1 TABLET(75 MG) BY MOUTH DAILY 90 tablet 3  . Cyanocobalamin 1000 MCG/ML KIT Inject 1,000 mcg as directed every 30 (thirty) days.    Marland Kitchen glucose blood (ONETOUCH VERIO) test strip Utilize to check blood sugar twice a day, fasting in morning with goal < 130 and then 2 hours after a meal with goal <180.  Document and bring to visits. 100 each 12  . Lancets (ONETOUCH ULTRASOFT) lancets Utilize to check blood sugar twice a day, fasting in morning with goal < 130 and then 2 hours after a meal with goal <180.  Document and bring to visits. 100 each 12  . metFORMIN (GLUCOPHAGE) 500 MG tablet Take 1 tablet (500 mg total) by mouth daily with breakfast. 90 tablet 3  . Multiple Vitamin (MULTIVITAMIN) tablet Take 1 tablet by mouth daily.    . pantoprazole (PROTONIX) 40 MG tablet TAKE 1 TABLET(40 MG) BY MOUTH DAILY 90 tablet 4  . penicillin v potassium (VEETID) 250 MG tablet TAKE 1 TABLET(250 MG) BY MOUTH TWICE DAILY 60 tablet 3  . PROAIR HFA 108 (90 Base) MCG/ACT inhaler INHALE 2 PUFFS INTO THE LUNGS EVERY 6 HOURS AS NEEDED FOR WHEEZING OR SHORTNESS OF BREATH 8.5 g 2  . rosuvastatin (CRESTOR) 20 MG tablet Take 1 tablet (20 mg total) by mouth 3 (three) times a week. (Patient taking differently: Take 20 mg by mouth 3 (three) times a week. MWF) 40 tablet 3   No current facility-administered medications on file prior to visit.    There are no Patient Instructions on file for this visit. No follow-ups on file.   Kris Hartmann, NP

## 2020-08-15 ENCOUNTER — Ambulatory Visit: Payer: Medicare Other | Admitting: Occupational Therapy

## 2020-08-15 ENCOUNTER — Ambulatory Visit: Payer: Medicare Other | Attending: Nurse Practitioner | Admitting: Occupational Therapy

## 2020-08-15 ENCOUNTER — Other Ambulatory Visit: Payer: Self-pay

## 2020-08-15 DIAGNOSIS — I890 Lymphedema, not elsewhere classified: Secondary | ICD-10-CM | POA: Insufficient documentation

## 2020-08-17 ENCOUNTER — Other Ambulatory Visit: Payer: Self-pay | Admitting: Nurse Practitioner

## 2020-08-17 ENCOUNTER — Ambulatory Visit (INDEPENDENT_AMBULATORY_CARE_PROVIDER_SITE_OTHER): Payer: Medicare Other

## 2020-08-17 ENCOUNTER — Other Ambulatory Visit: Payer: Self-pay

## 2020-08-17 DIAGNOSIS — E538 Deficiency of other specified B group vitamins: Secondary | ICD-10-CM | POA: Diagnosis not present

## 2020-08-17 MED ORDER — CYANOCOBALAMIN 1000 MCG/ML IJ SOLN
1000.0000 ug | Freq: Once | INTRAMUSCULAR | Status: AC
  Administered 2020-08-17: 1000 ug via INTRAMUSCULAR

## 2020-08-19 LAB — CUP PACEART REMOTE DEVICE CHECK
Date Time Interrogation Session: 20211106233909
Implantable Pulse Generator Implant Date: 20190328

## 2020-08-20 ENCOUNTER — Ambulatory Visit (INDEPENDENT_AMBULATORY_CARE_PROVIDER_SITE_OTHER): Payer: Medicare Other

## 2020-08-20 DIAGNOSIS — I6310 Cerebral infarction due to embolism of unspecified precerebral artery: Secondary | ICD-10-CM | POA: Diagnosis not present

## 2020-08-21 NOTE — Progress Notes (Signed)
Carelink Summary Report / Loop Recorder 

## 2020-08-22 ENCOUNTER — Ambulatory Visit: Payer: Medicare Other | Admitting: Occupational Therapy

## 2020-08-22 ENCOUNTER — Other Ambulatory Visit: Payer: Self-pay

## 2020-08-22 DIAGNOSIS — I890 Lymphedema, not elsewhere classified: Secondary | ICD-10-CM

## 2020-08-22 NOTE — Therapy (Signed)
[place] MAIN [facility] SERVICES [address] [place], [name], [address] Phone: [phone]   Fax:  [phone]  Occupational Therapy Treatment  Patient Details  Name: Sharon [name] MRN: [id number] Date of Birth: [birth year] Referring Provider (OT): Eulogio Ditch, NP   Encounter Date: 08/22/2020   OT End of Session - 08/22/20 1409    Visit Number 40    Number of Visits 72    Date for OT Re-Evaluation 09/24/20    Activity Tolerance Patient tolerated treatment well;No increased pain           Past Medical History:  Diagnosis Date  . Anxiety   . Arthritis    hands, upper back  . Asthma   . COPD (chronic obstructive pulmonary disease) ([place])   . History of cervical cancer   . Menopausal disorder   . Osteoporosis   . Pneumonia 1960  . Spasm of abdominal muscles of right side    intermittent  . TMJ (dislocation of temporomandibular joint)   . Wears dentures    partial lower    Past Surgical History:  Procedure Laterality Date  . ABDOMINAL HYSTERECTOMY  1970's  . bladder botox  2005  . BLADDER SUSPENSION  2004  . CATARACT EXTRACTION W/PHACO Right 03/09/2018   Procedure: CATARACT EXTRACTION PHACO AND INTRAOCULAR LENS PLACEMENT ([place]) right;  Surgeon: Eulogio Bear, MD;  Location: [place];  Service: Ophthalmology;  Laterality: Right;  CALL CELL 1ST  . CATARACT EXTRACTION W/PHACO Left 04/19/2018   Procedure: CATARACT EXTRACTION PHACO AND INTRAOCULAR LENS PLACEMENT (IOC)  LEFT;  Surgeon: Eulogio Bear, MD;  Location: [place];  Service: Ophthalmology;  Laterality: Left;  . COLONOSCOPY WITH PROPOFOL N/A 12/13/2015   Procedure: COLONOSCOPY WITH PROPOFOL;  Surgeon: Lucilla Lame, MD;  Location: [place];  Service: Endoscopy;  Laterality: N/A;  . LOOP RECORDER INSERTION N/A 01/07/2018   Procedure: LOOP RECORDER INSERTION;  Surgeon: Deboraha Sprang, MD;  Location: [place] [facility];  Service:  Cardiovascular;  Laterality: N/A;  . POLYPECTOMY N/A 12/13/2015   Procedure: POLYPECTOMY;  Surgeon: Lucilla Lame, MD;  Location: [place];  Service: Endoscopy;  Laterality: N/A;  SIGMOID COLON POLYPS X  5  . SHOULDER ARTHROSCOPY W/ ROTATOR CUFF REPAIR Right 1998  . TEE WITHOUT CARDIOVERSION N/A 01/06/2018   Procedure: TRANSESOPHAGEAL ECHOCARDIOGRAM (TEE);  Surgeon: Minna Merritts, MD;  Location: [facility];  Service: Cardiovascular;  Laterality: N/A;  . TONSILLECTOMY AND ADENOIDECTOMY      There were no vitals filed for this visit.   Subjective Assessment - 08/22/20 1358    Subjective  [name] returns today for OT visit 41 of 72 to address BLE Lymphedema. Pt presents with compression garments in place bilaterally.Pt reports with 6/10 lleg pain below the knees.    Pertinent History Hx anxiety, COPD, OA, Asthma, hx cervical ca, osteoporosis, hx pneumonia, hx CVA, hx LLE cellulitis, hx L leg wound, hx falls    Limitations chronic BLE leg swelling and associated pain, decreased balance, fall risk, decreased standing, walking and sitting tolerance, difficulty w/ lower body bathing and dressing, difficulty fitting LB clothing and shoes, unable to drive, impaired transfers and functional mobility, impaired social participation, impaired ability to perform household chores, cooking, meal prep    Pain Onset More than a month ago               LYMPHEDEMA/ONCOLOGY QUESTIONNAIRE - 08/22/20 0001      Right Lower Extremity  Lymphedema   Other RLE limb volume from ankle to tibial tuberosity = 3252.61 ml.    Other RLE A-D limb volume is decreased by 1.0 % since last measured on 05/23/20.      Left Lower Extremity Lymphedema   Other LLE (Rx limb) A-D volume measures 3487.4 ml. LLE A-D limb volume is increased by 9.6% since 02/15/20.    Other LLE A-D limb volume is DEcreased by 8.6 % since 05/23/20.                   OT Treatments/Exercises (OP) - 08/22/20 0001      ADLs   ADL  Education Given Yes      Manual Therapy   Manual Therapy Edema management;Manual Lymphatic Drainage (MLD);Compression Bandaging    Edema Management BLE comparative limb volumetrics    Compression Bandaging Pt donned compression garment after session w Max A due to time constraints.                  OT Education - 08/22/20 1408    Education Details Continued skilled Pt/caregiver education  And LE ADL training throughout visit for lymphedema self care/ home program, including compression wrapping, compression garment and device wear/care, lymphatic pumping ther ex, simple self-MLD, and skin care. Discussed progress towards goals.    Person(s) Educated Patient    Methods Explanation;Demonstration    Comprehension Verbalized understanding;Returned demonstration               OT Long Term Goals - 04/11/20 1628      OT LONG TERM GOAL #1   Title Pt will demonstrate understanding of lymphedema (LE) precautions / prevention principals, including signs / symptoms of cellulitis infection with modified independence using LE Workbook as printed reference to identify 6 precautions without verbal cues by end of 3rd  OT Rx visit.     Time 3    Period Days    Status Achieved      OT LONG TERM GOAL #2   Title Pt will be able todon and doff existing compression garments independently and without assistive devices and/ or extra time for optimal lymphedema self management over time and to limit infection risk.    Baseline supervision    Time 4    Period Days    Status Achieved      OT LONG TERM GOAL #3   Title Pt to achieve at least  10% limb volume reduction in affected limb(s)  bilaterally during Intensive Phase CDT to control limb swelling, to improve tissue integrity and immune function, to improve ADLs performance and to improve functional mobility/ transfer, and to improve body image and self-esteem.    Baseline max A    Time 8    Period Weeks    Status On-going   decreased 1.05%  on 02/15/20     OT LONG TERM GOAL #4   Title Pt will achieve 100% compliance with daily LE self-care home program components, including daily  skin care, simple self-MLD, gradient compression wraps/ compression garments/devices, and therapeutic exercise with Mod CG support to ensure optimal Intensive Phase limb volume reduction to expedite compression garment/ device fitting.    Baseline 50% compliant    Time 12    Period Weeks    Status Achieved      OT LONG TERM GOAL #5   Title Pt will use appropriate  assistive devices during LE self-care training to don compression garments/devices with modified independence to ensure optimal LE self-management  over time and to limit progression of chronic LE.    Baseline min A    Time 12    Period Weeks    Status Achieved   DC goal     OT LONG TERM GOAL #6   Title Pt will retain optimal limb volume reductions achieved during Intensive Phase CDT with no more than 3% volume increase with ongoing CG assistance to limit LE progression and further functional decline.    Baseline min A    Time 6    Period Months    Status On-going                 Plan - 08/22/20 1409    Clinical Impression Statement BLE comparative limb volumetrics reveal LLE A-D limb volume is DEcreased by 8.6 % , and R limb volume below the knee (A-D) is decreased by 3.2% since last measured on 05/23/20. Pt demonstrates a positive response to CDT bilaterally. She has remained infection free despite cuts to RLE requiring an ED visit. Pt tolerated brief MLD today   and skin care. Cont at current OT frequency thru December '21 and then consider reducing to follow alng status PRN.    Occupational performance deficits (Please refer to evaluation for details): ADL's;Leisure;IADL's;Social Participation;Rest and Sleep;Work    Marketing executive / Function / Physical Skills ADL;Balance;Decreased knowledge of use of DME;Decreased knowledge of precautions;Edema;Flexibility;IADL;Pain;Skin  integrity;Mobility           Patient will benefit from skilled therapeutic intervention in order to improve the following deficits and impairments:   Body Structure / Function / Physical Skills: ADL, Balance, Decreased knowledge of use of DME, Decreased knowledge of precautions, Edema, Flexibility, IADL, Pain, Skin integrity, Mobility       Visit Diagnosis: Lymphedema, not elsewhere classified    Problem List Patient Active Problem List   Diagnosis Date Noted  . Neuropathy of both feet 05/24/2020  . Hyperlipidemia associated with type 2 diabetes mellitus ([place]) 02/14/2020  . Obesity 02/14/2020  . Vitamin D deficiency 06/28/2019  . Type 2 diabetes mellitus with obesity ([place]) 06/28/2019  . GERD (gastroesophageal reflux disease) 12/07/2018  . Vitamin B12 deficiency 12/05/2018  . Senile purpura ([place]) 07/28/2018  . Lymphedema 07/14/2018  . Aneurysm of descending thoracic aorta (HCC) 05/04/2018  . Allergic rhinitis 02/16/2018  . Aortic atherosclerosis ([place]) 01/13/2018  . Status post placement of implantable loop recorder 01/12/2018  . Chronic kidney disease, stage 3 ([place]) 01/12/2018  . History of CVA (cerebrovascular accident)   . TIA (transient ischemic attack) 01/04/2018  . CAD (coronary artery disease) 11/25/2017  . Caregiver stress 11/25/2017  . Menopause 08/14/2017  . De Quervain's tenosynovitis, right 07/17/2017  . Stress incontinence 06/19/2017  . Personal history of tobacco use, presenting hazards to health 04/01/2017  . Anxiety 03/13/2017  . Advanced care planning/counseling discussion 10/17/2016  . Benign neoplasm of sigmoid colon   . Centrilobular emphysema ([place]) 10/17/2015  . Mass of upper inner quadrant of right breast 06/22/2015   Andrey Spearman, MS, OTR/L, [facility] 08/22/20 4:30 PM  [place] MAIN [facility] SERVICES [address] [place], [name], [address] Phone: [phone]   Fax:  [phone]  Name: Sharon [name] MRN: [id number] Date of Birth: 01-Jul-1946

## 2020-08-23 NOTE — Therapy (Signed)
[place] MAIN [facility] SERVICES [address] [place], [name], [address] Phone: [phone]   Fax:  [phone]  Occupational Therapy Treatment  Patient Details  Name: Sharon [name] MRN: [id number] Date of Birth: [birth year] Referring Provider (OT): Eulogio Ditch, NP   Encounter Date: 08/15/2020   OT End of Session - 08/22/20 1335    Number of Visits 72    Date for OT Re-Evaluation 09/24/20    OT Start Time 0110    OT Stop Time 0205    OT Time Calculation (min) 55 min    Activity Tolerance Patient tolerated treatment well;No increased pain           Past Medical History:  Diagnosis Date  . Anxiety   . Arthritis    hands, upper back  . Asthma   . COPD (chronic obstructive pulmonary disease) ([place])   . History of cervical cancer   . Menopausal disorder   . Osteoporosis   . Pneumonia 1960  . Spasm of abdominal muscles of right side    intermittent  . TMJ (dislocation of temporomandibular joint)   . Wears dentures    partial lower    Past Surgical History:  Procedure Laterality Date  . ABDOMINAL HYSTERECTOMY  1970's  . bladder botox  2005  . BLADDER SUSPENSION  2004  . CATARACT EXTRACTION W/PHACO Right 03/09/2018   Procedure: CATARACT EXTRACTION PHACO AND INTRAOCULAR LENS PLACEMENT ([place]) right;  Surgeon: Eulogio Bear, MD;  Location: [place];  Service: Ophthalmology;  Laterality: Right;  CALL CELL 1ST  . CATARACT EXTRACTION W/PHACO Left 04/19/2018   Procedure: CATARACT EXTRACTION PHACO AND INTRAOCULAR LENS PLACEMENT (IOC)  LEFT;  Surgeon: Eulogio Bear, MD;  Location: [place];  Service: Ophthalmology;  Laterality: Left;  . COLONOSCOPY WITH PROPOFOL N/A 12/13/2015   Procedure: COLONOSCOPY WITH PROPOFOL;  Surgeon: Lucilla Lame, MD;  Location: [place];  Service: Endoscopy;  Laterality: N/A;  . LOOP RECORDER INSERTION N/A 01/07/2018   Procedure: LOOP RECORDER INSERTION;  Surgeon: Deboraha Sprang,  MD;  Location: [place] [facility];  Service: Cardiovascular;  Laterality: N/A;  . POLYPECTOMY N/A 12/13/2015   Procedure: POLYPECTOMY;  Surgeon: Lucilla Lame, MD;  Location: [facility];  Service: Endoscopy;  Laterality: N/A;  SIGMOID COLON POLYPS X  5  . SHOULDER ARTHROSCOPY W/ ROTATOR CUFF REPAIR Right 1998  . TEE WITHOUT CARDIOVERSION N/A 01/06/2018   Procedure: TRANSESOPHAGEAL ECHOCARDIOGRAM (TEE);  Surgeon: Minna Merritts, MD;  Location: [facility];  Service: Cardiovascular;  Laterality: N/A;  . TONSILLECTOMY AND ADENOIDECTOMY      There were no vitals filed for this visit.                              OT Long Term Goals - 04/11/20 1628      OT LONG TERM GOAL #1   Title Pt will demonstrate understanding of lymphedema (LE) precautions / prevention principals, including signs / symptoms of cellulitis infection with modified independence using LE Workbook as printed reference to identify 6 precautions without verbal cues by end of 3rd  OT Rx visit.     Time 3    Period Days    Status Achieved      OT LONG TERM GOAL #2   Title Pt will be able todon and doff existing compression garments independently and without assistive devices and/ or extra time for optimal lymphedema self  management over time and to limit infection risk.    Baseline supervision    Time 4    Period Days    Status Achieved      OT LONG TERM GOAL #3   Title Pt to achieve at least  10% limb volume reduction in affected limb(s)  bilaterally during Intensive Phase CDT to control limb swelling, to improve tissue integrity and immune function, to improve ADLs performance and to improve functional mobility/ transfer, and to improve body image and self-esteem.    Baseline max A    Time 8    Period Weeks    Status On-going   decreased 1.05% on 02/15/20     OT LONG TERM GOAL #4   Title Pt will achieve 100% compliance with daily LE self-care home program components, including daily  skin  care, simple self-MLD, gradient compression wraps/ compression garments/devices, and therapeutic exercise with Mod CG support to ensure optimal Intensive Phase limb volume reduction to expedite compression garment/ device fitting.    Baseline 50% compliant    Time 12    Period Weeks    Status Achieved      OT LONG TERM GOAL #5   Title Pt will use appropriate  assistive devices during LE self-care training to don compression garments/devices with modified independence to ensure optimal LE self-management over time and to limit progression of chronic LE.    Baseline min A    Time 12    Period Weeks    Status Achieved   DC goal     OT LONG TERM GOAL #6   Title Pt will retain optimal limb volume reductions achieved during Intensive Phase CDT with no more than 3% volume increase with ongoing CG assistance to limit LE progression and further functional decline.    Baseline min A    Time 6    Period Months    Status On-going                  Patient will benefit from skilled therapeutic intervention in order to improve the following deficits and impairments:           Visit Diagnosis: Lymphedema, not elsewhere classified    Problem List Patient Active Problem List   Diagnosis Date Noted  . Neuropathy of both feet 05/24/2020  . Hyperlipidemia associated with type 2 diabetes mellitus ([place]) 02/14/2020  . Obesity 02/14/2020  . Vitamin D deficiency 06/28/2019  . Type 2 diabetes mellitus with obesity ([place]) 06/28/2019  . GERD (gastroesophageal reflux disease) 12/07/2018  . Vitamin B12 deficiency 12/05/2018  . Senile purpura ([name]) 07/28/2018  . Lymphedema 07/14/2018  . Aneurysm of descending thoracic aorta (HCC) 05/04/2018  . Allergic rhinitis 02/16/2018  . Aortic atherosclerosis ([place]) 01/13/2018  . Status post placement of implantable loop recorder 01/12/2018  . Chronic kidney disease, stage 3 ([place]) 01/12/2018  . History of CVA (cerebrovascular accident)   . TIA  (transient ischemic attack) 01/04/2018  . CAD (coronary artery disease) 11/25/2017  . Caregiver stress 11/25/2017  . Menopause 08/14/2017  . De Quervain's tenosynovitis, right 07/17/2017  . Stress incontinence 06/19/2017  . Personal history of tobacco use, presenting hazards to health 04/01/2017  . Anxiety 03/13/2017  . Advanced care planning/counseling discussion 10/17/2016  . Benign neoplasm of sigmoid colon   . Centrilobular emphysema ([place]) 10/17/2015  . Mass of upper inner quadrant of right breast 06/22/2015    Andrey Spearman, MS, OTR/L, [facility] 08/23/20 1:41 PM  [place]  [place], [name], [address] Phone: [phone]   Fax:  [phone]  Name: Sharon [name] MRN: [id number] Date of Birth: [birth year]

## 2020-08-29 ENCOUNTER — Ambulatory Visit: Payer: Medicare Other | Admitting: Occupational Therapy

## 2020-09-04 ENCOUNTER — Ambulatory Visit: Payer: Medicare Other | Admitting: Occupational Therapy

## 2020-09-04 ENCOUNTER — Other Ambulatory Visit: Payer: Self-pay

## 2020-09-04 DIAGNOSIS — I890 Lymphedema, not elsewhere classified: Secondary | ICD-10-CM | POA: Diagnosis not present

## 2020-09-04 NOTE — Therapy (Signed)
[name], [name], [address] Phone: [phone]   Fax:  [phone]  Occupational Therapy Treatment  Patient Details  Name: Sharon [name] MRN: [id number] Date of Birth: [birth year] Referring Provider (OT): Eulogio Ditch, NP   Encounter Date: 09/04/2020   OT End of Session - 09/04/20 1728    Visit Number 41    Number of Visits 72    Date for OT Re-Evaluation 09/24/20    OT Start Time 0210    OT Stop Time 0310    OT Time Calculation (min) 60 min    Activity Tolerance Patient tolerated treatment well;No increased pain           Past Medical History:  Diagnosis Date  . Anxiety   . Arthritis    hands, upper back  . Asthma   . COPD (chronic obstructive pulmonary disease) ([place])   . History of cervical cancer   . Menopausal disorder   . Osteoporosis   . Pneumonia 1960  . Spasm of abdominal muscles of right side    intermittent  . TMJ (dislocation of temporomandibular joint)   . Wears dentures    partial lower    Past Surgical History:  Procedure Laterality Date  . ABDOMINAL HYSTERECTOMY  1970's  . bladder botox  2005  . BLADDER SUSPENSION  2004  . CATARACT EXTRACTION W/PHACO Right 03/09/2018   Procedure: CATARACT EXTRACTION PHACO AND INTRAOCULAR LENS PLACEMENT ([place]) right;  Surgeon: Eulogio Bear, MD;  Location: [place];  Service: Ophthalmology;  Laterality: Right;  CALL CELL 1ST  . CATARACT EXTRACTION W/PHACO Left 04/19/2018   Procedure: CATARACT EXTRACTION PHACO AND INTRAOCULAR LENS PLACEMENT (IOC)  LEFT;  Surgeon: Eulogio Bear, MD;  Location: [place];  Service: Ophthalmology;  Laterality: Left;  . COLONOSCOPY WITH PROPOFOL N/A 12/13/2015   Procedure: COLONOSCOPY WITH PROPOFOL;  Surgeon: Lucilla Lame, MD;  Location: [place];  Service: Endoscopy;  Laterality: N/A;  . LOOP RECORDER INSERTION N/A 01/07/2018   Procedure: LOOP RECORDER INSERTION;   Surgeon: Deboraha Sprang, MD;  Location: [place] [facility];  Service: Cardiovascular;  Laterality: N/A;  . POLYPECTOMY N/A 12/13/2015   Procedure: POLYPECTOMY;  Surgeon: Lucilla Lame, MD;  Location: [place];  Service: Endoscopy;  Laterality: N/A;  SIGMOID COLON POLYPS X  5  . SHOULDER ARTHROSCOPY W/ ROTATOR CUFF REPAIR Right 1998  . TEE WITHOUT CARDIOVERSION N/A 01/06/2018   Procedure: TRANSESOPHAGEAL ECHOCARDIOGRAM (TEE);  Surgeon: Minna Merritts, MD;  Location: [facility];  Service: Cardiovascular;  Laterality: N/A;  . TONSILLECTOMY AND ADENOIDECTOMY      There were no vitals filed for this visit.   Subjective Assessment - 09/04/20 1727    Subjective  Sharon [name] returns today for OT visit 41 of 72 to address BLE Lymphedema. Pt presents with compression garments in place bilaterally.Pt reports with 6/10 leg pain below the knees. Pt reports leg abraisions bilaterally after working in the yard and being scratched by sticks.    Pertinent History Hx anxiety, COPD, OA, Asthma, hx cervical ca, osteoporosis, hx pneumonia, hx CVA, hx LLE cellulitis, hx L leg wound, hx falls    Limitations chronic BLE leg swelling and associated pain, decreased balance, fall risk, decreased standing, walking and sitting tolerance, difficulty w/ lower body bathing and dressing, difficulty fitting LB clothing and shoes, unable to drive, impaired transfers and functional mobility, impaired social participation, impaired ability to perform household chores, cooking, meal  prep    Pain Onset More than a month ago                        OT Treatments/Exercises (OP) - 09/04/20 0001      ADLs   ADL Education Given Yes      Manual Therapy   Manual Therapy Edema management;Manual Lymphatic Drainage (MLD);Compression Bandaging    Manual Lymphatic Drainage (MLD) MLD to RLE as established. No strokes to R leg in area of cat scratches, but strokes above and lateral/ medical    Compression Bandaging Max  A to don L compression knee high over wound dressing                  OT Education - 09/04/20 1727    Education Details Continued skilled Pt/caregiver education  And LE ADL training throughout visit for lymphedema self care/ home program, including compression wrapping, compression garment and device wear/care, lymphatic pumping ther ex, simple self-MLD, and skin care. Discussed progress towards goals.    Person(s) Educated Patient    Methods Explanation;Demonstration    Comprehension Verbalized understanding;Returned demonstration               OT Long Term Goals - 04/11/20 1628      OT LONG TERM GOAL #1   Title Pt will demonstrate understanding of lymphedema (LE) precautions / prevention principals, including signs / symptoms of cellulitis infection with modified independence using LE Workbook as printed reference to identify 6 precautions without verbal cues by end of 3rd  OT Rx visit.     Time 3    Period Days    Status Achieved      OT LONG TERM GOAL #2   Title Pt will be able todon and doff existing compression garments independently and without assistive devices and/ or extra time for optimal lymphedema self management over time and to limit infection risk.    Baseline supervision    Time 4    Period Days    Status Achieved      OT LONG TERM GOAL #3   Title Pt to achieve at least  10% limb volume reduction in affected limb(s)  bilaterally during Intensive Phase CDT to control limb swelling, to improve tissue integrity and immune function, to improve ADLs performance and to improve functional mobility/ transfer, and to improve body image and self-esteem.    Baseline max A    Time 8    Period Weeks    Status On-going   decreased 1.05% on 02/15/20     OT LONG TERM GOAL #4   Title Pt will achieve 100% compliance with daily LE self-care home program components, including daily  skin care, simple self-MLD, gradient compression wraps/ compression garments/devices, and  therapeutic exercise with Mod CG support to ensure optimal Intensive Phase limb volume reduction to expedite compression garment/ device fitting.    Baseline 50% compliant    Time 12    Period Weeks    Status Achieved      OT LONG TERM GOAL #5   Title Pt will use appropriate  assistive devices during LE self-care training to don compression garments/devices with modified independence to ensure optimal LE self-management over time and to limit progression of chronic LE.    Baseline min A    Time 12    Period Weeks    Status Achieved   DC goal     OT LONG TERM GOAL #6  Title Pt will retain optimal limb volume reductions achieved during Intensive Phase CDT with no more than 3% volume increase with ongoing CG assistance to limit LE progression and further functional decline.    Baseline min A    Time 6    Period Months    Status On-going                 Plan - 09/04/20 1729    Clinical Impression Statement Pt tolerated MLD to LLE today without increased pain. Dressings on scratches on anterior legs were not removed for examination. .Swelling bilaterally appears well controlled bilaterally. Pt reports decreased LLE pain below the knee after Rx session. Cont as per POC.           Patient will benefit from skilled therapeutic intervention in order to improve the following deficits and impairments:           Visit Diagnosis: Lymphedema, not elsewhere classified    Problem List Patient Active Problem List   Diagnosis Date Noted  . Neuropathy of both feet 05/24/2020  . Hyperlipidemia associated with type 2 diabetes mellitus ([place]) 02/14/2020  . Obesity 02/14/2020  . Vitamin D deficiency 06/28/2019  . Type 2 diabetes mellitus with obesity ([place]) 06/28/2019  . GERD (gastroesophageal reflux disease) 12/07/2018  . Vitamin B12 deficiency 12/05/2018  . Senile purpura ([place]) 07/28/2018  . Lymphedema 07/14/2018  . Aneurysm of descending thoracic aorta (HCC) 05/04/2018  .  Allergic rhinitis 02/16/2018  . Aortic atherosclerosis ([place]) 01/13/2018  . Status post placement of implantable loop recorder 01/12/2018  . Chronic kidney disease, stage 3 ([place]) 01/12/2018  . History of CVA (cerebrovascular accident)   . TIA (transient ischemic attack) 01/04/2018  . CAD (coronary artery disease) 11/25/2017  . Caregiver stress 11/25/2017  . Menopause 08/14/2017  . De Quervain's tenosynovitis, right 07/17/2017  . Stress incontinence 06/19/2017  . Personal history of tobacco use, presenting hazards to health 04/01/2017  . Anxiety 03/13/2017  . Advanced care planning/counseling discussion 10/17/2016  . Benign neoplasm of sigmoid colon   . Centrilobular emphysema ([place]) 10/17/2015  . Mass of upper inner quadrant of right breast 06/22/2015   Andrey Spearman, MS, OTR/L, [facility] 09/04/20 5:31 PM  [place] MAIN [facility] SERVICES [address] [place], [name], [address] Phone: [phone]   Fax:  [phone]  Name: Sharon [name] MRN: [id number] Date of Birth: 1946/04/26

## 2020-09-10 ENCOUNTER — Other Ambulatory Visit: Payer: Self-pay | Admitting: Nurse Practitioner

## 2020-09-10 MED ORDER — CHOLECALCIFEROL 1.25 MG (50000 UT) PO TABS
1.0000 | ORAL_TABLET | ORAL | 0 refills | Status: DC
Start: 2020-09-10 — End: 2020-11-02

## 2020-09-10 MED ORDER — BUDESONIDE-FORMOTEROL FUMARATE 160-4.5 MCG/ACT IN AERO
INHALATION_SPRAY | RESPIRATORY_TRACT | 4 refills | Status: DC
Start: 2020-09-10 — End: 2021-02-08

## 2020-09-11 ENCOUNTER — Ambulatory Visit (INDEPENDENT_AMBULATORY_CARE_PROVIDER_SITE_OTHER): Payer: Medicare Other

## 2020-09-11 ENCOUNTER — Ambulatory Visit (INDEPENDENT_AMBULATORY_CARE_PROVIDER_SITE_OTHER): Payer: Medicare Other | Admitting: Nurse Practitioner

## 2020-09-11 ENCOUNTER — Other Ambulatory Visit: Payer: Self-pay

## 2020-09-11 ENCOUNTER — Encounter (INDEPENDENT_AMBULATORY_CARE_PROVIDER_SITE_OTHER): Payer: Self-pay | Admitting: Nurse Practitioner

## 2020-09-11 VITALS — BP 143/84 | HR 90 | Resp 16 | Wt 216.0 lb

## 2020-09-11 DIAGNOSIS — E782 Mixed hyperlipidemia: Secondary | ICD-10-CM | POA: Diagnosis not present

## 2020-09-11 DIAGNOSIS — I739 Peripheral vascular disease, unspecified: Secondary | ICD-10-CM

## 2020-09-11 DIAGNOSIS — I714 Abdominal aortic aneurysm, without rupture, unspecified: Secondary | ICD-10-CM

## 2020-09-11 DIAGNOSIS — I890 Lymphedema, not elsewhere classified: Secondary | ICD-10-CM | POA: Diagnosis not present

## 2020-09-12 ENCOUNTER — Ambulatory Visit: Payer: Medicare Other | Attending: Nurse Practitioner | Admitting: Occupational Therapy

## 2020-09-12 ENCOUNTER — Other Ambulatory Visit: Payer: Self-pay | Admitting: Nurse Practitioner

## 2020-09-12 DIAGNOSIS — I890 Lymphedema, not elsewhere classified: Secondary | ICD-10-CM | POA: Insufficient documentation

## 2020-09-12 NOTE — Therapy (Signed)
[place] MAIN [facility] SERVICES [address] [place], [name], [address] Phone: [phone]   Fax:  [phone]  Occupational Therapy Treatment  Patient Details  Name: NEGIN HEGG MRN: [id number] Date of Birth: [birth year] Referring Provider (OT): Eulogio Ditch, NP   Encounter Date: 09/12/2020    Past Medical History:  Diagnosis Date  . Anxiety   . Arthritis    hands, upper back  . Asthma   . COPD (chronic obstructive pulmonary disease) ([place])   . History of cervical cancer   . Menopausal disorder   . Osteoporosis   . Pneumonia 1960  . Spasm of abdominal muscles of right side    intermittent  . TMJ (dislocation of temporomandibular joint)   . Wears dentures    partial lower    Past Surgical History:  Procedure Laterality Date  . ABDOMINAL HYSTERECTOMY  1970's  . bladder botox  2005  . BLADDER SUSPENSION  2004  . CATARACT EXTRACTION W/PHACO Right 03/09/2018   Procedure: CATARACT EXTRACTION PHACO AND INTRAOCULAR LENS PLACEMENT ([place]) right;  Surgeon: Eulogio Bear, MD;  Location: [place];  Service: Ophthalmology;  Laterality: Right;  CALL CELL 1ST  . CATARACT EXTRACTION W/PHACO Left 04/19/2018   Procedure: CATARACT EXTRACTION PHACO AND INTRAOCULAR LENS PLACEMENT (IOC)  LEFT;  Surgeon: Eulogio Bear, MD;  Location: [place];  Service: Ophthalmology;  Laterality: Left;  . COLONOSCOPY WITH PROPOFOL N/A 12/13/2015   Procedure: COLONOSCOPY WITH PROPOFOL;  Surgeon: Lucilla Lame, MD;  Location: [place];  Service: Endoscopy;  Laterality: N/A;  . LOOP RECORDER INSERTION N/A 01/07/2018   Procedure: LOOP RECORDER INSERTION;  Surgeon: Deboraha Sprang, MD;  Location: [place] [facility];  Service: Cardiovascular;  Laterality: N/A;  . POLYPECTOMY N/A 12/13/2015   Procedure: POLYPECTOMY;  Surgeon: Lucilla Lame, MD;  Location: [place];  Service: Endoscopy;  Laterality: N/A;  SIGMOID COLON POLYPS X  5  .  SHOULDER ARTHROSCOPY W/ ROTATOR CUFF REPAIR Right 1998  . TEE WITHOUT CARDIOVERSION N/A 01/06/2018   Procedure: TRANSESOPHAGEAL ECHOCARDIOGRAM (TEE);  Surgeon: Minna Merritts, MD;  Location: [facility];  Service: Cardiovascular;  Laterality: N/A;  . TONSILLECTOMY AND ADENOIDECTOMY      There were no vitals filed for this visit.   Subjective Assessment - 09/12/20 1516    Subjective  Mrs Cordoba returns today for OT visit 42 of 72 to address BLE Lymphedema. Pt presents with compression garments in place bilaterally.Pt reports she had dopler study yesterday and was told she "does have an aneurism in the lower abdominal area that should be watched", but upon review of medical record. imaging results rule out an aneurism. Pt encouraged to speak with her vascular provider for clarification.    Pertinent History Hx anxiety, COPD, OA, Asthma, hx cervical ca, osteoporosis, hx pneumonia, hx CVA, hx LLE cellulitis, hx L leg wound, hx falls    Limitations chronic BLE leg swelling and associated pain, decreased balance, fall risk, decreased standing, walking and sitting tolerance, difficulty w/ lower body bathing and dressing, difficulty fitting LB clothing and shoes, unable to drive, impaired transfers and functional mobility, impaired social participation, impaired ability to perform household chores, cooking, meal prep    Pain Onset More than a month ago                                OT Education - 09/12/20 8185  Education Details Continued skilled Pt/caregiver education  And LE ADL training throughout visit for lymphedema self care/ home program, including compression wrapping, compression garment and device wear/care, lymphatic pumping ther ex, simple self-MLD, and skin care. Discussed progress towards goals.    Person(s) Educated Patient    Methods Explanation;Demonstration    Comprehension Verbalized understanding;Returned demonstration               OT Long Term Goals  - 04/11/20 1628      OT LONG TERM GOAL #1   Title Pt will demonstrate understanding of lymphedema (LE) precautions / prevention principals, including signs / symptoms of cellulitis infection with modified independence using LE Workbook as printed reference to identify 6 precautions without verbal cues by end of 3rd  OT Rx visit.     Time 3    Period Days    Status Achieved      OT LONG TERM GOAL #2   Title Pt will be able todon and doff existing compression garments independently and without assistive devices and/ or extra time for optimal lymphedema self management over time and to limit infection risk.    Baseline supervision    Time 4    Period Days    Status Achieved      OT LONG TERM GOAL #3   Title Pt to achieve at least  10% limb volume reduction in affected limb(s)  bilaterally during Intensive Phase CDT to control limb swelling, to improve tissue integrity and immune function, to improve ADLs performance and to improve functional mobility/ transfer, and to improve body image and self-esteem.    Baseline max A    Time 8    Period Weeks    Status On-going   decreased 1.05% on 02/15/20     OT LONG TERM GOAL #4   Title Pt will achieve 100% compliance with daily LE self-care home program components, including daily  skin care, simple self-MLD, gradient compression wraps/ compression garments/devices, and therapeutic exercise with Mod CG support to ensure optimal Intensive Phase limb volume reduction to expedite compression garment/ device fitting.    Baseline 50% compliant    Time 12    Period Weeks    Status Achieved      OT LONG TERM GOAL #5   Title Pt will use appropriate  assistive devices during LE self-care training to don compression garments/devices with modified independence to ensure optimal LE self-management over time and to limit progression of chronic LE.    Baseline min A    Time 12    Period Weeks    Status Achieved   DC goal     OT LONG TERM GOAL #6   Title Pt  will retain optimal limb volume reductions achieved during Intensive Phase CDT with no more than 3% volume increase with ongoing CG assistance to limit LE progression and further functional decline.    Baseline min A    Time 6    Period Months    Status On-going                 Plan - 09/12/20 1558    Clinical Impression Statement Pt tolerated MLD to LLE today without increased pain. Swelling bilaterally appears well controlled today. Pt reports decreased LLE pain below the knees after Rx session. Cont as per POC.    Occupational performance deficits (Please refer to evaluation for details): ADL's;Leisure;IADL's;Social Participation;Rest and Sleep;Work           Patient will benefit  from skilled therapeutic intervention in order to improve the following deficits and impairments:           Visit Diagnosis: Lymphedema, not elsewhere classified    Problem List Patient Active Problem List   Diagnosis Date Noted  . Neuropathy of both feet 05/24/2020  . Hyperlipidemia associated with type 2 diabetes mellitus ([place]) 02/14/2020  . Obesity 02/14/2020  . Vitamin D deficiency 06/28/2019  . Type 2 diabetes mellitus with obesity ([place]) 06/28/2019  . GERD (gastroesophageal reflux disease) 12/07/2018  . Vitamin B12 deficiency 12/05/2018  . Senile purpura ([place]) 07/28/2018  . Lymphedema 07/14/2018  . Aneurysm of descending thoracic aorta (HCC) 05/04/2018  . Allergic rhinitis 02/16/2018  . Aortic atherosclerosis ([place]) 01/13/2018  . Status post placement of implantable loop recorder 01/12/2018  . Chronic kidney disease, stage 3 ([place]) 01/12/2018  . History of CVA (cerebrovascular accident)   . TIA (transient ischemic attack) 01/04/2018  . CAD (coronary artery disease) 11/25/2017  . Caregiver stress 11/25/2017  . Menopause 08/14/2017  . De Quervain's tenosynovitis, right 07/17/2017  . Stress incontinence 06/19/2017  . Personal history of tobacco use, presenting hazards to health  04/01/2017  . Anxiety 03/13/2017  . Advanced care planning/counseling discussion 10/17/2016  . Benign neoplasm of sigmoid colon   . Centrilobular emphysema ([place]) 10/17/2015  . Mass of upper inner quadrant of right breast 06/22/2015     [place] MAIN [facility] SERVICES [place], [name], [address] Phone: [phone]   Fax:  [phone]  Name: EVIAN DERRINGER MRN: [id number] Date of Birth: 1946-04-22

## 2020-09-13 ENCOUNTER — Encounter: Payer: Self-pay | Admitting: Nurse Practitioner

## 2020-09-13 ENCOUNTER — Encounter

## 2020-09-13 ENCOUNTER — Encounter (INDEPENDENT_AMBULATORY_CARE_PROVIDER_SITE_OTHER): Payer: Self-pay

## 2020-09-13 DIAGNOSIS — G720 Drug-induced myopathy: Secondary | ICD-10-CM | POA: Insufficient documentation

## 2020-09-16 ENCOUNTER — Encounter (INDEPENDENT_AMBULATORY_CARE_PROVIDER_SITE_OTHER): Payer: Self-pay | Admitting: Nurse Practitioner

## 2020-09-16 NOTE — Progress Notes (Incomplete)
Subjective:    Patient ID: Sharon [name], female    DOB: [birth year], 74 y.o.   MRN: [id number] Chief Complaint  Patient presents with  . Follow-up    ultrasound follow up    HPI  Review of Systems  All other systems reviewed and are negative.      Objective:   Physical Exam Vitals reviewed.  Cardiovascular:     Rate and Rhythm: Normal rate and regular rhythm.     Pulses: Normal pulses.  Pulmonary:     Effort: Pulmonary effort is normal.  Musculoskeletal:     Right lower leg: Edema present.     Left lower leg: Edema present.  Skin:    General: Skin is warm and dry.  Neurological:     Mental Status: She is alert and oriented to person, place, and time.  Psychiatric:        Mood and Affect: Mood normal.        Behavior: Behavior normal.        Thought Content: Thought content normal.        Judgment: Judgment normal.     BP (!) 143/84 (BP Location: Right Arm)   Pulse 90   Resp 16   Wt 216 lb (98 kg)   LMP  (LMP Unknown)   BMI 31.90 kg/m   Past Medical History:  Diagnosis Date  . Anxiety   . Arthritis    hands, upper back  . Asthma   . COPD (chronic obstructive pulmonary disease) ([place])   . History of cervical cancer   . Menopausal disorder   . Osteoporosis   . Pneumonia 1960  . Spasm of abdominal muscles of right side    intermittent  . TMJ (dislocation of temporomandibular joint)   . Wears dentures    partial lower    Social History   Socioeconomic History  . Marital status: Married    Spouse name: Melba Araki  . Number of children: 1  . Years of education: Not on file  . Highest education level: Some college, no degree  Occupational History  . Occupation: retired  Tobacco Use  . Smoking status: Former Smoker    Years: 56.00    Types: Cigarettes    Quit date: 07/14/2016    Years since quitting: 4.1  . Smokeless tobacco: Never Used  Vaping Use  . Vaping Use: Former  Substance and Sexual Activity  . Alcohol use: Yes    Alcohol/week:  0.0 standard drinks  . Drug use: No  . Sexual activity: Not Currently    Birth control/protection: Post-menopausal  Other Topics Concern  . 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 Difficulty of Paying Living Expenses: Not hard at all  Food Insecurity: No Food Insecurity  . Worried About Charity fundraiser in the Last Year: Never true  . Ran Out of Food in the Last Year: Never true  Transportation Needs: No Transportation Needs  . Lack of Transportation (Medical): No  . Lack of Transportation (Non-Medical): No  Physical Activity: Inactive  . Days of Exercise per Week: 0 days  . Minutes of Exercise per Session: 0 min  Stress: No Stress Concern Present  . Feeling of Stress : Not at all  Social Connections:   . Frequency of Communication with Friends and Family: Not on file  . Frequency of Social  Gatherings with Friends and Family: Not on file  . Attends Religious Services: Not on file  . Active Member of Clubs or Organizations: Not on file  . Attends Archivist Meetings: Not on file  . Marital Status: Not on file  Intimate Partner Violence:   . Fear of Current or Ex-Partner: Not on file  . Emotionally Abused: Not on file  . Physically Abused: Not on file  . Sexually Abused: Not on file    Past Surgical History:  Procedure Laterality Date  . ABDOMINAL HYSTERECTOMY  1970's  . bladder botox  2005  . BLADDER SUSPENSION  2004  . CATARACT EXTRACTION W/PHACO Right 03/09/2018   Procedure: CATARACT EXTRACTION PHACO AND INTRAOCULAR LENS PLACEMENT ([place]) right;  Surgeon: Eulogio Bear, MD;  Location: [place];  Service: Ophthalmology;  Laterality: Right;  CALL CELL 1ST  . CATARACT EXTRACTION W/PHACO Left 04/19/2018   Procedure: CATARACT EXTRACTION PHACO AND INTRAOCULAR LENS PLACEMENT ([facility])  LEFT;  Surgeon: Eulogio Bear, MD;  Location: [place];  Service: Ophthalmology;  Laterality: Left;  . COLONOSCOPY WITH PROPOFOL N/A 12/13/2015   Procedure: COLONOSCOPY WITH PROPOFOL;  Surgeon: Lucilla Lame, MD;  Location: [place];  Service: Endoscopy;  Laterality: N/A;  . LOOP RECORDER INSERTION N/A 01/07/2018   Procedure: LOOP RECORDER INSERTION;  Surgeon: Deboraha Sprang, MD;  Location: [place] [facility];  Service: Cardiovascular;  Laterality: N/A;  . POLYPECTOMY N/A 12/13/2015   Procedure: POLYPECTOMY;  Surgeon: Lucilla Lame, MD;  Location: [place];  Service: Endoscopy;  Laterality: N/A;  SIGMOID COLON POLYPS X  5  . SHOULDER ARTHROSCOPY W/ ROTATOR CUFF REPAIR Right 1998  . TEE WITHOUT CARDIOVERSION N/A 01/06/2018   Procedure: TRANSESOPHAGEAL ECHOCARDIOGRAM (TEE);  Surgeon: Minna Merritts, MD;  Location: [facility];  Service: Cardiovascular;  Laterality: N/A;  . TONSILLECTOMY AND ADENOIDECTOMY      Family History  Problem Relation Age of Onset  . Diabetes Mother   . Heart disease Mother   . Stroke Mother   . Stroke Maternal Grandmother     Allergies  Allergen Reactions  . Baclofen Swelling  . Bactrim [Sulfamethoxazole-Trimethoprim] Nausea And Vomiting  . Ibuprofen Rash    Mouth swelling  . Latex Rash    Some bandaids, some gloves; BLOOD TEST NEGATIVE  . Librium [Chlordiazepoxide] Itching    Dizziness   . Naprosyn [Naproxen] Rash    Mouth swelling  . Other Rash    [place] nuts - mouth swelling    CBC Latest Ref Rng & Units 07/17/2020 01/26/2020 06/28/2019  WBC 3.4 - 10.8 x10E3/uL 7.2 9.0 7.7  Hemoglobin 11.1 - 15.9 g/dL 14.4 15.0 14.4  Hematocrit 34.0 - 46.6 % 43.6 43.6 44.6  Platelets 150 - 450 x10E3/uL 201 201 250      CMP     Component Value Date/Time   NA 144 07/17/2020 1509   K 4.2 07/17/2020 1509   CL 105 07/17/2020 1509   CO2 24 07/17/2020 1509   GLUCOSE 132 (H) 07/17/2020 1509   GLUCOSE 166 (H) 05/28/2019 1408   BUN 12 07/17/2020 1509   CREATININE 0.97 07/17/2020 1509   CALCIUM 9.1  07/17/2020 1509   PROT 6.4 07/17/2020 1509   ALBUMIN 4.4 07/17/2020 1509   AST 20 07/17/2020 1509   ALT 23 07/17/2020 1509   ALKPHOS 74 07/17/2020 1509   BILITOT 0.4 07/17/2020 1509   GFRNONAA 58 (L) 07/17/2020 1509   GFRAA 67 07/17/2020 1509  No results found.     Assessment & Plan:   1. Lymphedema  No surgery or intervention at this point in time.    I have reviewed my discussion with the patient regarding lymphedema and why it  causes symptoms.  Patient will continue wearing graduated compression stockings class 1 (20-30 mmHg) on a daily basis a prescription was given. The patient is reminded to put the stockings on first thing in the morning and removing them in the evening. The patient is instructed specifically not to sleep in the stockings.   In addition, behavioral modification throughout the day will be continued.  This will include frequent elevation (such as in a recliner), use of over the counter pain medications as needed and exercise such as walking.  I have reviewed systemic causes for chronic edema such as liver, kidney and cardiac etiologies and there does not appear to be any significant changes in these organ systems over the past year.  The patient is under the impression that these organ systems are all stable and unchanged.    The patient will continue aggressive use of the  lymph pump.  This will continue to improve the edema control and prevent sequela such as ulcers and infections.   The patient will follow-up in 6 months and will continue with prophylactic antibiotics for prevention of recurrent cellulitis.   2. Abdominal aortic aneurysm (AAA) without rupture ([place]) No surgery or intervention at this time. The patient has an asymptomatic abdominal aortic aneurysm that is less than 4 cm in maximal diameter.  I have discussed the natural history of abdominal aortic aneurysm and the small risk of rupture for aneurysm less than 5 cm in size.  However, as these  small aneurysms tend to enlarge over time, continued surveillance with ultrasound or CT scan is mandatory.  I have also discussed optimizing medical management with hypertension and lipid control and the importance of abstinence from tobacco.  The patient is also encouraged to exercise a minimum of 30 minutes 4 times a week.  Should the patient develop new onset abdominal or back pain or signs of peripheral embolization they are instructed to seek medical attention immediately and to alert the physician providing care that they have an aneurysm.  The patient voices their understanding. The patient will return in 6 months with an aortic duplex.   3. Mixed hyperlipidemia Continue statin as ordered and reviewed, no changes at this time   4. Claudication [facility]) ***   Current Outpatient Medications on File Prior to Visit  Medication Sig Dispense Refill  . acetaminophen (TYLENOL) 500 MG tablet Take 500 mg by mouth every 6 (six) hours as needed.    . Blood Glucose Monitoring Suppl (ONETOUCH VERIO) w/Device KIT Utilize to check blood sugar twice a day, fasting in morning with goal < 130 and then 2 hours after a meal with goal <180.  Document and bring to visits. 1 kit 0  . budesonide-formoterol (SYMBICORT) 160-4.5 MCG/ACT inhaler INHALE 2 PUFFS BY MOUTH TWICE DAILY 10.2 g 4  . Cholecalciferol 1.25 MG (50000 UT) TABS Take 1 tablet by mouth once a week. For 8 weeks and then stop.  Return to office for lab draw. 8 tablet 0  . citalopram (CELEXA) 20 MG tablet Take 1 tablet (20 mg total) by mouth daily. 90 tablet 3  . clopidogrel (PLAVIX) 75 MG tablet TAKE 1 TABLET(75 MG) BY MOUTH DAILY 90 tablet 3  . Cyanocobalamin 1000 MCG/ML KIT Inject 1,000 mcg as directed every  30 (thirty) days.    Marland Kitchen glucose blood (ONETOUCH VERIO) test strip Utilize to check blood sugar twice a day, fasting in morning with goal < 130 and then 2 hours after a meal with goal <180.  Document and bring to visits. 100 each 12  . Lancets  (ONETOUCH ULTRASOFT) lancets Utilize to check blood sugar twice a day, fasting in morning with goal < 130 and then 2 hours after a meal with goal <180.  Document and bring to visits. 100 each 12  . metFORMIN (GLUCOPHAGE) 500 MG tablet Take 1 tablet (500 mg total) by mouth daily with breakfast. 90 tablet 3  . Multiple Vitamin (MULTIVITAMIN) tablet Take 1 tablet by mouth daily.    . pantoprazole (PROTONIX) 40 MG tablet TAKE 1 TABLET(40 MG) BY MOUTH DAILY 90 tablet 4  . penicillin v potassium (VEETID) 250 MG tablet TAKE 1 TABLET(250 MG) BY MOUTH TWICE DAILY 60 tablet 3  . PROAIR HFA 108 (90 Base) MCG/ACT inhaler INHALE 2 PUFFS INTO THE LUNGS EVERY 6 HOURS AS NEEDED FOR WHEEZING OR SHORTNESS OF BREATH 8.5 g 2  . rosuvastatin (CRESTOR) 20 MG tablet Take 1 tablet (20 mg total) by mouth 3 (three) times a week. (Patient taking differently: Take 20 mg by mouth 3 (three) times a week. MWF) 40 tablet 3   No current facility-administered medications on file prior to visit.    There are no Patient Instructions on file for this visit. No follow-ups on file.   Kris Hartmann, NP

## 2020-09-16 NOTE — Progress Notes (Signed)
Subjective:    Patient ID: Sharon [name], female    DOB: 1946-03-13, 74 y.o.   MRN: [id number] Chief Complaint  Patient presents with  . Follow-up    ultrasound follow up    Patient returns today for noninvasive studies in regards to lower extremity tightness and weakness.  The patient denies standard claudication-like symptoms but notes that her legs get tired and weak after continued activity.  The patient also has a history of back pain and issues.  Patient also notes that her lower extremity lymphedema has been under good control with no recurrent episodes of cellulitis.  Patient remains on prophylactic antibiotics due to her multiple episodes of cellulitis.  She denies any chest pain or shortness of breath.  She denies any fever, chills, nausea, vomiting or diarrhea.  The noninvasive study showed no evidence of significant stenosis within the iliac arteries.  Previous ABIs within normal limits.  It is noted that the patient has an abdominal aortic measurement of 3.3 cm.  There are no previous studies for comparison or mention of abdominal aortic aneurysm within the patient's medical record.   Review of Systems  Cardiovascular: Positive for leg swelling.  Musculoskeletal: Positive for back pain.  All other systems reviewed and are negative.      Objective:   Physical Exam Vitals reviewed.  Cardiovascular:     Rate and Rhythm: Normal rate and regular rhythm.     Pulses: Normal pulses.  Pulmonary:     Effort: Pulmonary effort is normal.  Musculoskeletal:     Right lower leg: Edema present.     Left lower leg: Edema present.  Skin:    General: Skin is warm and dry.  Neurological:     Mental Status: She is alert and oriented to person, place, and time.  Psychiatric:        Mood and Affect: Mood normal.        Behavior: Behavior normal.        Thought Content: Thought content normal.        Judgment: Judgment normal.     BP (!) 143/84 (BP Location: Right Arm)   Pulse 90    Resp 16   Wt 216 lb (98 kg)   LMP  (LMP Unknown)   BMI 31.90 kg/m   Past Medical History:  Diagnosis Date  . Anxiety   . Arthritis    hands, upper back  . Asthma   . COPD (chronic obstructive pulmonary disease) ([place])   . History of cervical cancer   . Menopausal disorder   . Osteoporosis   . Pneumonia 1960  . Spasm of abdominal muscles of right side    intermittent  . TMJ (dislocation of temporomandibular joint)   . Wears dentures    partial lower    Social History   Socioeconomic History  . Marital status: Married    Spouse name: Sary Bogie  . Number of children: 1  . Years of education: Not on file  . Highest education level: Some college, no degree  Occupational History  . Occupation: retired  Tobacco Use  . Smoking status: Former Smoker    Years: 56.00    Types: Cigarettes    Quit date: 07/14/2016    Years since quitting: 4.1  . Smokeless tobacco: Never Used  Vaping Use  . Vaping Use: Former  Substance and Sexual Activity  . Alcohol use: Yes    Alcohol/week: 0.0 standard drinks  . Drug use: No  . Sexual  activity: Not Currently    Birth control/protection: Post-menopausal  Other Topics Concern  . 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 Difficulty of Paying Living Expenses: Not hard at all  Food Insecurity: No Food Insecurity  . Worried About Charity fundraiser in the Last Year: Never true  . Ran Out of Food in the Last Year: Never true  Transportation Needs: No Transportation Needs  . Lack of Transportation (Medical): No  . Lack of Transportation (Non-Medical): No  Physical Activity: Inactive  . Days of Exercise per Week: 0 days  . Minutes of Exercise per Session: 0 min  Stress: No Stress Concern Present  . Feeling of Stress : Not at all  Social Connections:   . Frequency of Communication with Friends and Family: Not on file  .  Frequency of Social Gatherings with Friends and Family: Not on file  . Attends Religious Services: Not on file  . Active Member of Clubs or Organizations: Not on file  . Attends Archivist Meetings: Not on file  . Marital Status: Not on file  Intimate Partner Violence:   . Fear of Current or Ex-Partner: Not on file  . Emotionally Abused: Not on file  . Physically Abused: Not on file  . Sexually Abused: Not on file    Past Surgical History:  Procedure Laterality Date  . ABDOMINAL HYSTERECTOMY  1970's  . bladder botox  2005  . BLADDER SUSPENSION  2004  . CATARACT EXTRACTION W/PHACO Right 03/09/2018   Procedure: CATARACT EXTRACTION PHACO AND INTRAOCULAR LENS PLACEMENT ([place]) right;  Surgeon: Eulogio Bear, MD;  Location: [place];  Service: Ophthalmology;  Laterality: Right;  CALL CELL 1ST  . CATARACT EXTRACTION W/PHACO Left 04/19/2018   Procedure: CATARACT EXTRACTION PHACO AND INTRAOCULAR LENS PLACEMENT ([facility])  LEFT;  Surgeon: Eulogio Bear, MD;  Location: [place];  Service: Ophthalmology;  Laterality: Left;  . COLONOSCOPY WITH PROPOFOL N/A 12/13/2015   Procedure: COLONOSCOPY WITH PROPOFOL;  Surgeon: Lucilla Lame, MD;  Location: [place];  Service: Endoscopy;  Laterality: N/A;  . LOOP RECORDER INSERTION N/A 01/07/2018   Procedure: LOOP RECORDER INSERTION;  Surgeon: Deboraha Sprang, MD;  Location: [place] [facility];  Service: Cardiovascular;  Laterality: N/A;  . POLYPECTOMY N/A 12/13/2015   Procedure: POLYPECTOMY;  Surgeon: Lucilla Lame, MD;  Location: [place];  Service: Endoscopy;  Laterality: N/A;  SIGMOID COLON POLYPS X  5  . SHOULDER ARTHROSCOPY W/ ROTATOR CUFF REPAIR Right 1998  . TEE WITHOUT CARDIOVERSION N/A 01/06/2018   Procedure: TRANSESOPHAGEAL ECHOCARDIOGRAM (TEE);  Surgeon: Minna Merritts, MD;  Location: [facility];  Service: Cardiovascular;  Laterality: N/A;  . TONSILLECTOMY AND ADENOIDECTOMY      Family History   Problem Relation Age of Onset  . Diabetes Mother   . Heart disease Mother   . Stroke Mother   . Stroke Maternal Grandmother     Allergies  Allergen Reactions  . Baclofen Swelling  . Bactrim [Sulfamethoxazole-Trimethoprim] Nausea And Vomiting  . Ibuprofen Rash    Mouth swelling  . Latex Rash    Some bandaids, some gloves; BLOOD TEST NEGATIVE  . Librium [Chlordiazepoxide] Itching    Dizziness   . Naprosyn [Naproxen] Rash    Mouth swelling  . Other Rash    [place] nuts - mouth swelling    CBC Latest  Ref Rng & Units 07/17/2020 01/26/2020 06/28/2019  WBC 3.4 - 10.8 x10E3/uL 7.2 9.0 7.7  Hemoglobin 11.1 - 15.9 g/dL 14.4 15.0 14.4  Hematocrit 34.0 - 46.6 % 43.6 43.6 44.6  Platelets 150 - 450 x10E3/uL 201 201 250      CMP     Component Value Date/Time   NA 144 07/17/2020 1509   K 4.2 07/17/2020 1509   CL 105 07/17/2020 1509   CO2 24 07/17/2020 1509   GLUCOSE 132 (H) 07/17/2020 1509   GLUCOSE 166 (H) 05/28/2019 1408   BUN 12 07/17/2020 1509   CREATININE 0.97 07/17/2020 1509   CALCIUM 9.1 07/17/2020 1509   PROT 6.4 07/17/2020 1509   ALBUMIN 4.4 07/17/2020 1509   AST 20 07/17/2020 1509   ALT 23 07/17/2020 1509   ALKPHOS 74 07/17/2020 1509   BILITOT 0.4 07/17/2020 1509   GFRNONAA 58 (L) 07/17/2020 1509   GFRAA 67 07/17/2020 1509     No results found.     Assessment & Plan:   1. Lymphedema  No surgery or intervention at this point in time.    I have reviewed my discussion with the patient regarding lymphedema and why it  causes symptoms.  Patient will continue wearing graduated compression stockings class 1 (20-30 mmHg) on a daily basis a prescription was given. The patient is reminded to put the stockings on first thing in the morning and removing them in the evening. The patient is instructed specifically not to sleep in the stockings.   In addition, behavioral modification throughout the day will be continued.  This will include frequent elevation (such as in a  recliner), use of over the counter pain medications as needed and exercise such as walking.  I have reviewed systemic causes for chronic edema such as liver, kidney and cardiac etiologies and there does not appear to be any significant changes in these organ systems over the past year.  The patient is under the impression that these organ systems are all stable and unchanged.    The patient will continue aggressive use of the  lymph pump.  This will continue to improve the edema control and prevent sequela such as ulcers and infections.   The patient will follow-up in 6 months and will continue with prophylactic antibiotics for prevention of recurrent cellulitis.   2. Abdominal aortic aneurysm (AAA) without rupture ([place]) No surgery or intervention at this time. The patient has an asymptomatic abdominal aortic aneurysm that is less than 4 cm in maximal diameter.  I have discussed the natural history of abdominal aortic aneurysm and the small risk of rupture for aneurysm less than 5 cm in size.  However, as these small aneurysms tend to enlarge over time, continued surveillance with ultrasound or CT scan is mandatory.  I have also discussed optimizing medical management with hypertension and lipid control and the importance of abstinence from tobacco.  The patient is also encouraged to exercise a minimum of 30 minutes 4 times a week.  Should the patient develop new onset abdominal or back pain or signs of peripheral embolization they are instructed to seek medical attention immediately and to alert the physician providing care that they have an aneurysm.  The patient voices their understanding. The patient will return in 6 months with an aortic duplex.   3. Mixed hyperlipidemia Continue statin as ordered and reviewed, no changes at this time   4. Claudication [facility]) Recommend:  I do not find evidence of Vascular pathology that would  explain the patient's symptoms  The patient has atypical  pain symptoms for vascular disease  I do not find evidence of Vascular pathology that would explain the patient's symptoms and I suspect the patient is c/o pseudoclaudication.  Patient should have an evaluation of his LS spine which I defer to the primary service.  Noninvasive studies including venous ultrasound of the legs do not identify vascular problems  The patient should continue walking and begin a more formal exercise program. The patient should continue his antiplatelet therapy and aggressive treatment of the lipid abnormalities. The patient should begin wearing graduated compression socks 15-20 mmHg strength to control her mild edema.  Patient will follow-up with me on a PRN basis  Further work-up of her lower extremity pain is deferred to the primary service     Current Outpatient Medications on File Prior to Visit  Medication Sig Dispense Refill  . acetaminophen (TYLENOL) 500 MG tablet Take 500 mg by mouth every 6 (six) hours as needed.    . Blood Glucose Monitoring Suppl (ONETOUCH VERIO) w/Device KIT Utilize to check blood sugar twice a day, fasting in morning with goal < 130 and then 2 hours after a meal with goal <180.  Document and bring to visits. 1 kit 0  . budesonide-formoterol (SYMBICORT) 160-4.5 MCG/ACT inhaler INHALE 2 PUFFS BY MOUTH TWICE DAILY 10.2 g 4  . Cholecalciferol 1.25 MG (50000 UT) TABS Take 1 tablet by mouth once a week. For 8 weeks and then stop.  Return to office for lab draw. 8 tablet 0  . citalopram (CELEXA) 20 MG tablet Take 1 tablet (20 mg total) by mouth daily. 90 tablet 3  . clopidogrel (PLAVIX) 75 MG tablet TAKE 1 TABLET(75 MG) BY MOUTH DAILY 90 tablet 3  . Cyanocobalamin 1000 MCG/ML KIT Inject 1,000 mcg as directed every 30 (thirty) days.    Marland Kitchen glucose blood (ONETOUCH VERIO) test strip Utilize to check blood sugar twice a day, fasting in morning with goal < 130 and then 2 hours after a meal with goal <180.  Document and bring to visits. 100 each 12   . Lancets (ONETOUCH ULTRASOFT) lancets Utilize to check blood sugar twice a day, fasting in morning with goal < 130 and then 2 hours after a meal with goal <180.  Document and bring to visits. 100 each 12  . metFORMIN (GLUCOPHAGE) 500 MG tablet Take 1 tablet (500 mg total) by mouth daily with breakfast. 90 tablet 3  . Multiple Vitamin (MULTIVITAMIN) tablet Take 1 tablet by mouth daily.    . pantoprazole (PROTONIX) 40 MG tablet TAKE 1 TABLET(40 MG) BY MOUTH DAILY 90 tablet 4  . penicillin v potassium (VEETID) 250 MG tablet TAKE 1 TABLET(250 MG) BY MOUTH TWICE DAILY 60 tablet 3  . PROAIR HFA 108 (90 Base) MCG/ACT inhaler INHALE 2 PUFFS INTO THE LUNGS EVERY 6 HOURS AS NEEDED FOR WHEEZING OR SHORTNESS OF BREATH 8.5 g 2  . rosuvastatin (CRESTOR) 20 MG tablet Take 1 tablet (20 mg total) by mouth 3 (three) times a week. (Patient taking differently: Take 20 mg by mouth 3 (three) times a week. MWF) 40 tablet 3   No current facility-administered medications on file prior to visit.    There are no Patient Instructions on file for this visit. No follow-ups on file.   Kris Hartmann, NP

## 2020-09-17 ENCOUNTER — Ambulatory Visit (INDEPENDENT_AMBULATORY_CARE_PROVIDER_SITE_OTHER): Payer: Medicare Other

## 2020-09-17 ENCOUNTER — Other Ambulatory Visit: Payer: Self-pay

## 2020-09-17 ENCOUNTER — Other Ambulatory Visit: Payer: Medicare Other

## 2020-09-17 DIAGNOSIS — E559 Vitamin D deficiency, unspecified: Secondary | ICD-10-CM

## 2020-09-17 DIAGNOSIS — E538 Deficiency of other specified B group vitamins: Secondary | ICD-10-CM | POA: Diagnosis not present

## 2020-09-17 MED ORDER — CYANOCOBALAMIN 1000 MCG/ML IJ SOLN
1000.0000 ug | Freq: Once | INTRAMUSCULAR | Status: AC
  Administered 2020-09-17: 1000 ug via INTRAMUSCULAR

## 2020-09-18 ENCOUNTER — Ambulatory Visit: Payer: Medicare Other | Admitting: Occupational Therapy

## 2020-09-18 DIAGNOSIS — I890 Lymphedema, not elsewhere classified: Secondary | ICD-10-CM | POA: Diagnosis not present

## 2020-09-18 LAB — VITAMIN D 25 HYDROXY (VIT D DEFICIENCY, FRACTURES): Vit D, 25-Hydroxy: 70 ng/mL (ref 30.0–100.0)

## 2020-09-18 NOTE — Progress Notes (Signed)
Contacted via MyChart  Good morning Sharon [name], Vitamin D level has much improved, continue supplement.  Much better!!!! Keep being awesome!!  Thank you for allowing me to participate in your care. Kindest regards, Roniqua Kintz

## 2020-09-19 NOTE — Therapy (Signed)
[place] MAIN [facility] SERVICES [address] [place], [name], [address] Phone: [phone]   Fax:  [phone]  Occupational Therapy Treatment  Patient Details  Name: Sharon [name] MRN: [id number] Date of Birth: [birth year] Referring Provider (OT): Eulogio Ditch, NP   Encounter Date: 09/18/2020   OT End of Session - 09/18/20 0839    Visit Number 43    Number of Visits 72    Date for OT Re-Evaluation 09/24/20    OT Start Time 0200    OT Stop Time 0300    OT Time Calculation (min) 60 min    Activity Tolerance Patient tolerated treatment well;No increased pain           Past Medical History:  Diagnosis Date  . Anxiety   . Arthritis    hands, upper back  . Asthma   . COPD (chronic obstructive pulmonary disease) ([place])   . History of cervical cancer   . Menopausal disorder   . Osteoporosis   . Pneumonia 1960  . Spasm of abdominal muscles of right side    intermittent  . TMJ (dislocation of temporomandibular joint)   . Wears dentures    partial lower    Past Surgical History:  Procedure Laterality Date  . ABDOMINAL HYSTERECTOMY  1970's  . bladder botox  2005  . BLADDER SUSPENSION  2004  . CATARACT EXTRACTION W/PHACO Right 03/09/2018   Procedure: CATARACT EXTRACTION PHACO AND INTRAOCULAR LENS PLACEMENT ([place]) right;  Surgeon: Eulogio Bear, MD;  Location: [place];  Service: Ophthalmology;  Laterality: Right;  CALL CELL 1ST  . CATARACT EXTRACTION W/PHACO Left 04/19/2018   Procedure: CATARACT EXTRACTION PHACO AND INTRAOCULAR LENS PLACEMENT (IOC)  LEFT;  Surgeon: Eulogio Bear, MD;  Location: [place];  Service: Ophthalmology;  Laterality: Left;  . COLONOSCOPY WITH PROPOFOL N/A 12/13/2015   Procedure: COLONOSCOPY WITH PROPOFOL;  Surgeon: Lucilla Lame, MD;  Location: [place];  Service: Endoscopy;  Laterality: N/A;  . LOOP RECORDER INSERTION N/A 01/07/2018   Procedure: LOOP RECORDER INSERTION;   Surgeon: Deboraha Sprang, MD;  Location: [place] [facility];  Service: Cardiovascular;  Laterality: N/A;  . POLYPECTOMY N/A 12/13/2015   Procedure: POLYPECTOMY;  Surgeon: Lucilla Lame, MD;  Location: [place];  Service: Endoscopy;  Laterality: N/A;  SIGMOID COLON POLYPS X  5  . SHOULDER ARTHROSCOPY W/ ROTATOR CUFF REPAIR Right 1998  . TEE WITHOUT CARDIOVERSION N/A 01/06/2018   Procedure: TRANSESOPHAGEAL ECHOCARDIOGRAM (TEE);  Surgeon: Minna Merritts, MD;  Location: [facility];  Service: Cardiovascular;  Laterality: N/A;  . TONSILLECTOMY AND ADENOIDECTOMY      There were no vitals filed for this visit.   Subjective Assessment - 09/18/20 0835    Subjective  [name] returns today for OT visit 43of 49 to address BLE Lymphedema. Pt presents with compression garments in place bilaterally.Pt continues to feel anxious re recent dx for abdominal aortic aneurism. Pt encouraged to seek further clarification from vascular provider. Leg pain , L>R, is unchanged. Pt does not rate today.    Pertinent History Hx anxiety, COPD, OA, Asthma, hx cervical ca, osteoporosis, hx pneumonia, hx CVA, hx LLE cellulitis, hx L leg wound, hx falls    Limitations chronic BLE leg swelling and associated pain, decreased balance, fall risk, decreased standing, walking and sitting tolerance, difficulty w/ lower body bathing and dressing, difficulty fitting LB clothing and shoes, unable to drive, impaired transfers and functional mobility, impaired social participation, impaired  ability to perform household chores, cooking, meal prep    Pain Onset More than a month ago                        OT Treatments/Exercises (OP) - 09/19/20 0001      ADLs   ADL Education Given Yes      Manual Therapy   Manual Therapy Edema management;Manual Lymphatic Drainage (MLD);Compression Bandaging    Soft tissue mobilization skin care w low ph castor oil throughout MLD to improve skin hydration and mobility.    Manual  Lymphatic Drainage (MLD) MLD to RLE as established. No strokes to R leg in area of cat scratches, but strokes above and lateral/ medical    Compression Bandaging Max A to don L compression knee high over wound dressing                  OT Education - 09/18/20 0839    Education Details Entry level edu re abdominal aortic aneurism. Continued skilled Pt/caregiver education  And LE ADL training throughout visit for lymphedema self care/ home program, including compression wrapping, compression garment and device wear/care, lymphatic pumping ther ex, simple self-MLD, and skin care. Discussed progress towards goals.    Person(s) Educated Patient    Methods Explanation;Demonstration    Comprehension Verbalized understanding;Returned demonstration               OT Long Term Goals - 04/11/20 1628      OT LONG TERM GOAL #1   Title Pt will demonstrate understanding of lymphedema (LE) precautions / prevention principals, including signs / symptoms of cellulitis infection with modified independence using LE Workbook as printed reference to identify 6 precautions without verbal cues by end of 3rd  OT Rx visit.     Time 3    Period Days    Status Achieved      OT LONG TERM GOAL #2   Title Pt will be able todon and doff existing compression garments independently and without assistive devices and/ or extra time for optimal lymphedema self management over time and to limit infection risk.    Baseline supervision    Time 4    Period Days    Status Achieved      OT LONG TERM GOAL #3   Title Pt to achieve at least  10% limb volume reduction in affected limb(s)  bilaterally during Intensive Phase CDT to control limb swelling, to improve tissue integrity and immune function, to improve ADLs performance and to improve functional mobility/ transfer, and to improve body image and self-esteem.    Baseline max A    Time 8    Period Weeks    Status On-going   decreased 1.05% on 02/15/20     OT LONG  TERM GOAL #4   Title Pt will achieve 100% compliance with daily LE self-care home program components, including daily  skin care, simple self-MLD, gradient compression wraps/ compression garments/devices, and therapeutic exercise with Mod CG support to ensure optimal Intensive Phase limb volume reduction to expedite compression garment/ device fitting.    Baseline 50% compliant    Time 12    Period Weeks    Status Achieved      OT LONG TERM GOAL #5   Title Pt will use appropriate  assistive devices during LE self-care training to don compression garments/devices with modified independence to ensure optimal LE self-management over time and to limit progression of chronic LE.  Baseline min A    Time 12    Period Weeks    Status Achieved   DC goal     OT LONG TERM GOAL #6   Title Pt will retain optimal limb volume reductions achieved during Intensive Phase CDT with no more than 3% volume increase with ongoing CG assistance to limit LE progression and further functional decline.    Baseline min A    Time 6    Period Months    Status On-going                 Plan - 09/19/20 0840    Clinical Impression Statement In an effort to reduce anxiety provided reassurance re recent dx of aortic aneurism and encouraged Pt to seek further clarification from her vascular provider re her current questions and concerns. Provided MLD and skin care to LLE as established. Pt reports analgesic effect with MLD. Skin condition continues to improve.    Occupational performance deficits (Please refer to evaluation for details): ADL's;Leisure;IADL's;Social Participation;Rest and Sleep;Work           Patient will benefit from skilled therapeutic intervention in order to improve the following deficits and impairments:           Visit Diagnosis: Lymphedema, not elsewhere classified    Problem List Patient Active Problem List   Diagnosis Date Noted  . Drug-induced myopathy 09/13/2020  .  Neuropathy of both feet 05/24/2020  . Hyperlipidemia associated with type 2 diabetes mellitus ([place]) 02/14/2020  . Obesity 02/14/2020  . Vitamin D deficiency 06/28/2019  . Type 2 diabetes mellitus with obesity ([place]) 06/28/2019  . GERD (gastroesophageal reflux disease) 12/07/2018  . Vitamin B12 deficiency 12/05/2018  . Senile purpura ([place]) 07/28/2018  . Lymphedema 07/14/2018  . Aneurysm of descending thoracic aorta (HCC) 05/04/2018  . Allergic rhinitis 02/16/2018  . Aortic atherosclerosis ([place]) 01/13/2018  . Status post placement of implantable loop recorder 01/12/2018  . Chronic kidney disease, stage 3 ([place]) 01/12/2018  . History of CVA (cerebrovascular accident)   . TIA (transient ischemic attack) 01/04/2018  . CAD (coronary artery disease) 11/25/2017  . Caregiver stress 11/25/2017  . Menopause 08/14/2017  . De Quervain's tenosynovitis, right 07/17/2017  . Stress incontinence 06/19/2017  . Personal history of tobacco use, presenting hazards to health 04/01/2017  . Anxiety 03/13/2017  . Advanced care planning/counseling discussion 10/17/2016  . Benign neoplasm of sigmoid colon   . Centrilobular emphysema ([place]) 10/17/2015  . Mass of upper inner quadrant of right breast 06/22/2015    Andrey Spearman, MS, OTR/L, [facility] 09/19/20 8:43 AM  [place] MAIN [facility] SERVICES [address] [place], [name], [address] Phone: [phone]   Fax:  [phone]  Name: Sharon [name] MRN: [id number] Date of Birth: 1946/01/26

## 2020-09-24 ENCOUNTER — Ambulatory Visit (INDEPENDENT_AMBULATORY_CARE_PROVIDER_SITE_OTHER): Payer: Medicare Other

## 2020-09-24 DIAGNOSIS — I6310 Cerebral infarction due to embolism of unspecified precerebral artery: Secondary | ICD-10-CM

## 2020-09-24 LAB — CUP PACEART REMOTE DEVICE CHECK
Date Time Interrogation Session: 20211210010930
Implantable Pulse Generator Implant Date: 20190328

## 2020-10-02 ENCOUNTER — Telehealth: Payer: Self-pay

## 2020-10-02 NOTE — Telephone Encounter (Signed)
The patient called to get help with her monitor. Transmission received.

## 2020-10-03 ENCOUNTER — Ambulatory Visit: Payer: Medicare Other | Admitting: Occupational Therapy

## 2020-10-03 ENCOUNTER — Other Ambulatory Visit: Payer: Self-pay

## 2020-10-03 DIAGNOSIS — I890 Lymphedema, not elsewhere classified: Secondary | ICD-10-CM

## 2020-10-03 NOTE — Therapy (Signed)
[place] MAIN [facility] SERVICES [address] [place], [name], [address] Phone: [phone]   Fax:  [phone]  Occupational Therapy Treatment  Patient Details  Name: Sharon [name] MRN: [id number] Date of Birth: [birth year] Referring Provider (OT): Eulogio Ditch, NP   Encounter Date: 10/03/2020   OT End of Session - 10/03/20 1408    Visit Number 44    Number of Visits 72    Date for OT Re-Evaluation 09/24/20    OT Start Time 0110    OT Stop Time 0211    OT Time Calculation (min) 61 min    Activity Tolerance Patient tolerated treatment well;No increased pain           Past Medical History:  Diagnosis Date  . Anxiety   . Arthritis    hands, upper back  . Asthma   . COPD (chronic obstructive pulmonary disease) ([place])   . History of cervical cancer   . Menopausal disorder   . Osteoporosis   . Pneumonia 1960  . Spasm of abdominal muscles of right side    intermittent  . TMJ (dislocation of temporomandibular joint)   . Wears dentures    partial lower    Past Surgical History:  Procedure Laterality Date  . ABDOMINAL HYSTERECTOMY  1970's  . bladder botox  2005  . BLADDER SUSPENSION  2004  . CATARACT EXTRACTION W/PHACO Right 03/09/2018   Procedure: CATARACT EXTRACTION PHACO AND INTRAOCULAR LENS PLACEMENT ([place]) right;  Surgeon: Eulogio Bear, MD;  Location: [place];  Service: Ophthalmology;  Laterality: Right;  CALL CELL 1ST  . CATARACT EXTRACTION W/PHACO Left 04/19/2018   Procedure: CATARACT EXTRACTION PHACO AND INTRAOCULAR LENS PLACEMENT (IOC)  LEFT;  Surgeon: Eulogio Bear, MD;  Location: [place];  Service: Ophthalmology;  Laterality: Left;  . COLONOSCOPY WITH PROPOFOL N/A 12/13/2015   Procedure: COLONOSCOPY WITH PROPOFOL;  Surgeon: Lucilla Lame, MD;  Location: [place];  Service: Endoscopy;  Laterality: N/A;  . LOOP RECORDER INSERTION N/A 01/07/2018   Procedure: LOOP RECORDER INSERTION;   Surgeon: Deboraha Sprang, MD;  Location: [place] [facility];  Service: Cardiovascular;  Laterality: N/A;  . POLYPECTOMY N/A 12/13/2015   Procedure: POLYPECTOMY;  Surgeon: Lucilla Lame, MD;  Location: [place];  Service: Endoscopy;  Laterality: N/A;  SIGMOID COLON POLYPS X  5  . SHOULDER ARTHROSCOPY W/ ROTATOR CUFF REPAIR Right 1998  . TEE WITHOUT CARDIOVERSION N/A 01/06/2018   Procedure: TRANSESOPHAGEAL ECHOCARDIOGRAM (TEE);  Surgeon: Minna Merritts, MD;  Location: [facility];  Service: Cardiovascular;  Laterality: N/A;  . TONSILLECTOMY AND ADENOIDECTOMY      There were no vitals filed for this visit.   Subjective Assessment - 10/03/20 1405    Subjective  [name] returns today for OT visit 44 of 72 to address BLE Lymphedema. Pt presents with compression garments in place bilaterally. Leg pain rated 6-7/10 this morning.Pt  has no new complaints.    Pertinent History Hx anxiety, COPD, OA, Asthma, hx cervical ca, osteoporosis, hx pneumonia, hx CVA, hx LLE cellulitis, hx L leg wound, hx falls    Limitations chronic BLE leg swelling and associated pain, decreased balance, fall risk, decreased standing, walking and sitting tolerance, difficulty w/ lower body bathing and dressing, difficulty fitting LB clothing and shoes, unable to drive, impaired transfers and functional mobility, impaired social participation, impaired ability to perform household chores, cooking, meal prep    Pain Onset More than a month ago  OT Treatments/Exercises (OP) - 10/03/20 0001      ADLs   ADL Education Given Yes      Manual Therapy   Manual Therapy Edema management;Manual Lymphatic Drainage (MLD);Compression Bandaging    Soft tissue mobilization skin care w low ph castor oil throughout MLD to improve skin hydration and mobility.    Manual Lymphatic Drainage (MLD) MLD to RLE as established. No strokes to R leg in area of cat scratches, but strokes above and lateral/  medical    Compression Bandaging Max A to don L compression knee high over wound dressing                  OT Education - 10/03/20 1408    Education Details Continued skilled Pt/caregiver education  And LE ADL training throughout visit for lymphedema self care/ home program, including compression wrapping, compression garment and device wear/care, lymphatic pumping ther ex, simple self-MLD, and skin care. Discussed progress towards goals.    Person(s) Educated Patient    Methods Explanation;Demonstration    Comprehension Verbalized understanding;Returned demonstration               OT Long Term Goals - 04/11/20 1628      OT LONG TERM GOAL #1   Title Pt will demonstrate understanding of lymphedema (LE) precautions / prevention principals, including signs / symptoms of cellulitis infection with modified independence using LE Workbook as printed reference to identify 6 precautions without verbal cues by end of 3rd  OT Rx visit.     Time 3    Period Days    Status Achieved      OT LONG TERM GOAL #2   Title Pt will be able todon and doff existing compression garments independently and without assistive devices and/ or extra time for optimal lymphedema self management over time and to limit infection risk.    Baseline supervision    Time 4    Period Days    Status Achieved      OT LONG TERM GOAL #3   Title Pt to achieve at least  10% limb volume reduction in affected limb(s)  bilaterally during Intensive Phase CDT to control limb swelling, to improve tissue integrity and immune function, to improve ADLs performance and to improve functional mobility/ transfer, and to improve body image and self-esteem.    Baseline max A    Time 8    Period Weeks    Status On-going   decreased 1.05% on 02/15/20     OT LONG TERM GOAL #4   Title Pt will achieve 100% compliance with daily LE self-care home program components, including daily  skin care, simple self-MLD, gradient compression  wraps/ compression garments/devices, and therapeutic exercise with Mod CG support to ensure optimal Intensive Phase limb volume reduction to expedite compression garment/ device fitting.    Baseline 50% compliant    Time 12    Period Weeks    Status Achieved      OT LONG TERM GOAL #5   Title Pt will use appropriate  assistive devices during LE self-care training to don compression garments/devices with modified independence to ensure optimal LE self-management over time and to limit progression of chronic LE.    Baseline min A    Time 12    Period Weeks    Status Achieved   DC goal     OT LONG TERM GOAL #6   Title Pt will retain optimal limb volume reductions achieved during Intensive Phase CDT  with no more than 3% volume increase with ongoing CG assistance to limit LE progression and further functional decline.    Baseline min A    Time 6    Period Months    Status On-going                 Plan - 10/03/20 1412    Clinical Impression Statement Pt tolerated MLD to LLE today without increased pain. Swelling bilaterally appears well controlled today. Pt reports decreased LLE pain below the knees after Rx session. Cont as per POC.    Occupational performance deficits (Please refer to evaluation for details): ADL's;Leisure;IADL's;Social Participation;Rest and Sleep;Work           Patient will benefit from skilled therapeutic intervention in order to improve the following deficits and impairments:           Visit Diagnosis: Lymphedema, not elsewhere classified    Problem List Patient Active Problem List   Diagnosis Date Noted  . Drug-induced myopathy 09/13/2020  . Neuropathy of both feet 05/24/2020  . Hyperlipidemia associated with type 2 diabetes mellitus ([employer]) 02/14/2020  . Obesity 02/14/2020  . Vitamin D deficiency 06/28/2019  . Type 2 diabetes mellitus with obesity ([place]) 06/28/2019  . GERD (gastroesophageal reflux disease) 12/07/2018  . Vitamin B12 deficiency  12/05/2018  . Senile purpura ([place]) 07/28/2018  . Lymphedema 07/14/2018  . Aneurysm of descending thoracic aorta (HCC) 05/04/2018  . Allergic rhinitis 02/16/2018  . Aortic atherosclerosis ([place]) 01/13/2018  . Status post placement of implantable loop recorder 01/12/2018  . Chronic kidney disease, stage 3 ([place]) 01/12/2018  . History of CVA (cerebrovascular accident)   . TIA (transient ischemic attack) 01/04/2018  . CAD (coronary artery disease) 11/25/2017  . Caregiver stress 11/25/2017  . Menopause 08/14/2017  . De Quervain's tenosynovitis, right 07/17/2017  . Stress incontinence 06/19/2017  . Personal history of tobacco use, presenting hazards to health 04/01/2017  . Anxiety 03/13/2017  . Advanced care planning/counseling discussion 10/17/2016  . Benign neoplasm of sigmoid colon   . Centrilobular emphysema ([place]) 10/17/2015  . Mass of upper inner quadrant of right breast 06/22/2015    Andrey Spearman, MS, OTR/L, [facility] 10/03/20 2:13 PM  [place] MAIN [facility] SERVICES [address] [place], [name], [address] Phone: [phone]   Fax:  [phone]  Name: Sharon [name] MRN: [id number] Date of Birth: [birth year]

## 2020-10-09 NOTE — Progress Notes (Signed)
Carelink Summary Report / Loop Recorder 

## 2020-10-12 ENCOUNTER — Other Ambulatory Visit (INDEPENDENT_AMBULATORY_CARE_PROVIDER_SITE_OTHER): Payer: Self-pay | Admitting: Nurse Practitioner

## 2020-10-12 DIAGNOSIS — L03119 Cellulitis of unspecified part of limb: Secondary | ICD-10-CM

## 2020-10-15 ENCOUNTER — Ambulatory Visit: Payer: Medicare Other | Admitting: Occupational Therapy

## 2020-10-18 ENCOUNTER — Ambulatory Visit (INDEPENDENT_AMBULATORY_CARE_PROVIDER_SITE_OTHER): Payer: Medicare Other

## 2020-10-18 ENCOUNTER — Other Ambulatory Visit: Payer: Self-pay

## 2020-10-18 ENCOUNTER — Encounter (INDEPENDENT_AMBULATORY_CARE_PROVIDER_SITE_OTHER): Payer: Self-pay

## 2020-10-18 DIAGNOSIS — K140 Glossitis: Secondary | ICD-10-CM

## 2020-10-18 MED ORDER — CYANOCOBALAMIN 1000 MCG/ML IJ SOLN
1000.0000 ug | Freq: Once | INTRAMUSCULAR | Status: AC
  Administered 2020-10-18: 1000 ug via INTRAMUSCULAR

## 2020-10-18 NOTE — Telephone Encounter (Signed)
No. Patient will need to be seen if she still needs ABX.

## 2020-10-18 NOTE — Telephone Encounter (Signed)
Is this ok to fill? 

## 2020-10-21 ENCOUNTER — Other Ambulatory Visit (INDEPENDENT_AMBULATORY_CARE_PROVIDER_SITE_OTHER): Payer: Self-pay | Admitting: Nurse Practitioner

## 2020-10-21 DIAGNOSIS — L03119 Cellulitis of unspecified part of limb: Secondary | ICD-10-CM

## 2020-10-21 MED ORDER — PENICILLIN V POTASSIUM 250 MG PO TABS: ORAL_TABLET | ORAL | 6 refills | Status: DC

## 2020-10-24 ENCOUNTER — Encounter (INDEPENDENT_AMBULATORY_CARE_PROVIDER_SITE_OTHER): Payer: Self-pay

## 2020-10-29 ENCOUNTER — Ambulatory Visit (INDEPENDENT_AMBULATORY_CARE_PROVIDER_SITE_OTHER): Payer: Medicare Other

## 2020-10-29 DIAGNOSIS — I6310 Cerebral infarction due to embolism of unspecified precerebral artery: Secondary | ICD-10-CM

## 2020-11-01 LAB — CUP PACEART REMOTE DEVICE CHECK
Date Time Interrogation Session: 20220112024843
Implantable Pulse Generator Implant Date: 20190328

## 2020-11-02 ENCOUNTER — Other Ambulatory Visit: Payer: Self-pay | Admitting: Nurse Practitioner

## 2020-11-02 MED ORDER — CHOLECALCIFEROL 1.25 MG (50000 UT) PO TABS
1.0000 | ORAL_TABLET | ORAL | 0 refills | Status: DC
Start: 2020-11-02 — End: 2021-02-04

## 2020-11-12 ENCOUNTER — Other Ambulatory Visit: Payer: Self-pay

## 2020-11-12 ENCOUNTER — Ambulatory Visit: Payer: Medicare Other | Attending: Nurse Practitioner | Admitting: Occupational Therapy

## 2020-11-12 DIAGNOSIS — I890 Lymphedema, not elsewhere classified: Secondary | ICD-10-CM | POA: Insufficient documentation

## 2020-11-12 NOTE — Therapy (Signed)
[place] MAIN [facility] SERVICES [address] [place], [name], [address] Phone: [phone]   Fax:  [phone]  Occupational Therapy Treatment Note, Progress Report for Lymphedema Care Recertification Reporting period 11/12/20 through 02/10/21  Patient Details  Name: Sharon [name] MRN: [id number] Date of Birth: July 10, 1946 Referring Provider (OT): Eulogio Ditch, NP   Encounter Date: 11/12/2020   OT End of Session - 11/12/20 1618    Visit Number 45    Number of Visits 72    Date for OT Re-Evaluation 02/10/21    OT Start Time 0245    OT Stop Time 0400    OT Time Calculation (min) 75 min    Activity Tolerance Patient tolerated treatment well;No increased pain           Past Medical History:  Diagnosis Date  . Anxiety   . Arthritis    hands, upper back  . Asthma   . COPD (chronic obstructive pulmonary disease) ([place])   . History of cervical cancer   . Menopausal disorder   . Osteoporosis   . Pneumonia 1960  . Spasm of abdominal muscles of right side    intermittent  . TMJ (dislocation of temporomandibular joint)   . Wears dentures    partial lower    Past Surgical History:  Procedure Laterality Date  . ABDOMINAL HYSTERECTOMY  1970's  . bladder botox  2005  . BLADDER SUSPENSION  2004  . CATARACT EXTRACTION W/PHACO Right 03/09/2018   Procedure: CATARACT EXTRACTION PHACO AND INTRAOCULAR LENS PLACEMENT ([place]) right;  Surgeon: Eulogio Bear, MD;  Location: [place];  Service: Ophthalmology;  Laterality: Right;  CALL CELL 1ST  . CATARACT EXTRACTION W/PHACO Left 04/19/2018   Procedure: CATARACT EXTRACTION PHACO AND INTRAOCULAR LENS PLACEMENT ([facility])  LEFT;  Surgeon: Eulogio Bear, MD;  Location: [place];  Service: Ophthalmology;  Laterality: Left;  . COLONOSCOPY WITH PROPOFOL N/A 12/13/2015   Procedure: COLONOSCOPY WITH PROPOFOL;  Surgeon: Lucilla Lame, MD;  Location: [place];  Service: Endoscopy;   Laterality: N/A;  . LOOP RECORDER INSERTION N/A 01/07/2018   Procedure: LOOP RECORDER INSERTION;  Surgeon: Deboraha Sprang, MD;  Location: [place] [facility];  Service: Cardiovascular;  Laterality: N/A;  . POLYPECTOMY N/A 12/13/2015   Procedure: POLYPECTOMY;  Surgeon: Lucilla Lame, MD;  Location: [place];  Service: Endoscopy;  Laterality: N/A;  SIGMOID COLON POLYPS X  5  . SHOULDER ARTHROSCOPY W/ ROTATOR CUFF REPAIR Right 1998  . TEE WITHOUT CARDIOVERSION N/A 01/06/2018   Procedure: TRANSESOPHAGEAL ECHOCARDIOGRAM (TEE);  Surgeon: Minna Merritts, MD;  Location: [facility];  Service: Cardiovascular;  Laterality: N/A;  . TONSILLECTOMY AND ADENOIDECTOMY      There were no vitals filed for this visit.   Subjective Assessment - 11/12/20 1458    Subjective  Mrs Coonrod returns today for OT visit 45 of 72 to address BLE Lymphedema. Pt was last seen on 10/03/20. Pt presents with compression garments in place bilaterally. Leg pain rated 7/10 today. Pt confirms that she wears compression garments daily as directed.She denies any changes in her health status since last visit.    Pertinent History Hx anxiety, COPD, OA, Asthma, hx cervical ca, osteoporosis, hx pneumonia, hx CVA, hx LLE cellulitis, hx L leg wound, hx falls    Limitations chronic BLE leg swelling and associated pain, decreased balance, fall risk, decreased standing, walking and sitting tolerance, difficulty w/ lower body bathing and dressing, difficulty fitting LB clothing and  shoes, unable to drive, impaired transfers and functional mobility, impaired social participation, impaired ability to perform household chores, cooking, meal prep    Pain Onset More than a month ago                        OT Treatments/Exercises (OP) - 11/12/20 0001      ADLs   ADL Education Given Yes      Manual Therapy   Manual Therapy Edema management;Manual Lymphatic Drainage (MLD);Compression Bandaging    Soft tissue mobilization skin  care w low ph castor oil  to BLE throughout MLD to improve skin hydration and mobility.    Manual Lymphatic Drainage (MLD) MLD to LE as established. No strokes to R leg in area of cat scratches, but strokes above and lateral/ medical    Compression Bandaging OPt donned BLE knee high compression stockings independently after Rx                  OT Education - 11/12/20 1610    Education Details Continued skilled Pt/caregiver education  And LE ADL training throughout visit for lymphedema self care/ home program, including compression wrapping, compression garment and device wear/care, lymphatic pumping ther ex, simple self-MLD, and skin care. Discussed progress towards goals.    Person(s) Educated Patient    Methods Explanation;Demonstration    Comprehension Verbalized understanding;Returned demonstration               OT Long Term Goals - 11/12/20 1628      OT LONG TERM GOAL #1   Title Pt will demonstrate understanding of lymphedema (LE) precautions / prevention principals, including signs / symptoms of cellulitis infection with modified independence using LE Workbook as printed reference to identify 6 precautions without verbal cues by end of 3rd  OT Rx visit.     Time 3    Period Days    Status Achieved      OT LONG TERM GOAL #2   Title Pt will be able todon and doff existing compression garments independently and without assistive devices and/ or extra time for optimal lymphedema self management over time and to limit infection risk.    Baseline supervision    Time 4    Period Days    Status Achieved      OT LONG TERM GOAL #3   Title Pt to achieve at least  10% limb volume reduction in affected limb(s)  bilaterally during Intensive Phase CDT to control limb swelling, to improve tissue integrity and immune function, to improve ADLs performance and to improve functional mobility/ transfer, and to improve body image and self-esteem.    Baseline max A    Time 8    Period  Weeks    Status Partially Met   decreased 1.05% on 02/15/20     OT LONG TERM GOAL #4   Title Pt will achieve 100% compliance with daily LE self-care home program components, including daily  skin care, simple self-MLD, gradient compression wraps/ compression garments/devices, and therapeutic exercise with Mod CG support to ensure optimal Intensive Phase limb volume reduction to expedite compression garment/ device fitting.    Baseline 50% compliant    Time 12    Period Weeks    Status Achieved      OT LONG TERM GOAL #5   Title Pt will use appropriate  assistive devices during LE self-care training to don compression garments/devices with modified independence to ensure optimal LE self-management over  time and to limit progression of chronic LE.    Baseline min A    Time 12    Period Weeks    Status Achieved   DC goal     OT LONG TERM GOAL #6   Title Pt will retain optimal limb volume reductions achieved during Intensive Phase CDT with no more than 3% volume increase with ongoing CG assistance to limit LE progression and further functional decline.    Baseline min A    Time 6    Period Months    Status On-going    Target Date 02/10/21                 Plan - 11/12/20 1504    Clinical Impression Statement Despite report of increased leg pain bilaterally, Pt presents today with very well managed lymphedema. She is wearing recommended compression garments daily as directed, donning and doffing them independently. Garments are in good condition. Skin is very well hydrated without hypersensativity to palpation. Signs/ symptoms of infection are absent. Persistent redness due to venous disease at distal legs remains. Pt knows how to perform simple self-MLF, although I am unsure as to how frequently she performs it. All in all she is doing very well and has remained infection free over several months.  Please refer to Long Term Goals Section of this note for detailed progress to date. She  tolerated MLD to BLE today without increased pain. Pt is in agreement with my assessment that she has met initial OT goals and has successfully transitioned to self-management phase of CDT. She'll call PRN if issues arise. I'll see her back for follow-along in 6-8 weeks.    OT Occupational Profile and History Comprehensive Assessment- Review of records and extensive additional review of physical, cognitive, psychosocial history related to current functional performance    Occupational performance deficits (Please refer to evaluation for details): ADL's;Leisure;IADL's;Social Participation;Rest and Sleep;Work    Marketing executive / Function / Physical Skills ADL;Balance;Decreased knowledge of use of DME;Decreased knowledge of precautions;Edema;Flexibility;IADL;Pain;Skin integrity;Mobility    Rehab Potential Good    Clinical Decision Making Several treatment options, min-mod task modification necessary    Comorbidities Affecting Occupational Performance: Presence of comorbidities impacting occupational performance    Comorbidities impacting occupational performance description: COPD, CHF, CKD, OA, hx anxiety Asthma    Modification or Assistance to Complete Evaluation  Min-Moderate modification of tasks or assist with assess necessary to complete eval    OT Frequency Other (comment)   6-8 wk F/U and PRN should problem arise. Cont q 3 months after next visit.   OT Duration --   90 days cert period   OT Treatment/Interventions Self-care/ADL training;Therapeutic exercise;Energy conservation;Functional Mobility Training;Manual lymph drainage;Scar mobilization;Therapeutic activities;Coping strategies training;Patient/family education;Compression bandaging;Manual Therapy;DME and/or AE instruction    Plan Replace compression garments PRN. Consider increasing compression frm ccl 1 (20-30 mmHg) back to initially recommended ccl 2 ( 30-40 mmHg. Existing OTS compression socks are inadequate for containing leg swelling     OT Home Exercise Plan lymphatic pumping ther ex 2 x daily, 2 sets of 10 , seated or supine in bed    Consulted and Agree with Plan of Care Patient           Patient will benefit from skilled therapeutic intervention in order to improve the following deficits and impairments:   Body Structure / Function / Physical Skills: ADL,Balance,Decreased knowledge of use of DME,Decreased knowledge of precautions,Edema,Flexibility,IADL,Pain,Skin integrity,Mobility       Visit Diagnosis: Lymphedema,  not elsewhere classified - Plan: Ot plan of care cert/re-cert    Problem List Patient Active Problem List   Diagnosis Date Noted  . Drug-induced myopathy 09/13/2020  . Neuropathy of both feet 05/24/2020  . Hyperlipidemia associated with type 2 diabetes mellitus ([place]) 02/14/2020  . Obesity 02/14/2020  . Vitamin D deficiency 06/28/2019  . Type 2 diabetes mellitus with obesity ([place]) 06/28/2019  . GERD (gastroesophageal reflux disease) 12/07/2018  . Vitamin B12 deficiency 12/05/2018  . Senile purpura ([place]) 07/28/2018  . Lymphedema 07/14/2018  . Aneurysm of descending thoracic aorta (HCC) 05/04/2018  . Allergic rhinitis 02/16/2018  . Aortic atherosclerosis ([place]) 01/13/2018  . Status post placement of implantable loop recorder 01/12/2018  . Chronic kidney disease, stage 3 ([place]) 01/12/2018  . History of CVA (cerebrovascular accident)   . TIA (transient ischemic attack) 01/04/2018  . CAD (coronary artery disease) 11/25/2017  . Caregiver stress 11/25/2017  . Menopause 08/14/2017  . De Quervain's tenosynovitis, right 07/17/2017  . Stress incontinence 06/19/2017  . Personal history of tobacco use, presenting hazards to health 04/01/2017  . Anxiety 03/13/2017  . Advanced care planning/counseling discussion 10/17/2016  . Benign neoplasm of sigmoid colon   . Centrilobular emphysema ([place]) 10/17/2015  . Mass of upper inner quadrant of right breast 06/22/2015    Andrey Spearman, MS, OTR/L,  [facility] 11/12/20 4:35 PM  [place] MAIN [facility] SERVICES [address] [place], [name], [address] Phone: [phone]   Fax:  [phone]  Name: Sharon [name] MRN: [id number] Date of Birth: Jan 01, 1946

## 2020-11-13 NOTE — Progress Notes (Signed)
Carelink Summary Report / Loop Recorder 

## 2020-11-20 ENCOUNTER — Ambulatory Visit (INDEPENDENT_AMBULATORY_CARE_PROVIDER_SITE_OTHER): Payer: Medicare Other

## 2020-11-20 ENCOUNTER — Other Ambulatory Visit: Payer: Self-pay

## 2020-11-20 DIAGNOSIS — K140 Glossitis: Secondary | ICD-10-CM

## 2020-11-20 MED ORDER — CYANOCOBALAMIN 1000 MCG/ML IJ SOLN
1000.0000 ug | Freq: Once | INTRAMUSCULAR | Status: AC
  Administered 2020-11-20: 1000 ug via INTRAMUSCULAR

## 2020-11-28 LAB — CUP PACEART REMOTE DEVICE CHECK
Date Time Interrogation Session: 20220214033133
Implantable Pulse Generator Implant Date: 20190328

## 2020-12-03 ENCOUNTER — Ambulatory Visit (INDEPENDENT_AMBULATORY_CARE_PROVIDER_SITE_OTHER): Payer: Medicare Other

## 2020-12-03 DIAGNOSIS — G459 Transient cerebral ischemic attack, unspecified: Secondary | ICD-10-CM

## 2020-12-03 DIAGNOSIS — Z1231 Encounter for screening mammogram for malignant neoplasm of breast: Secondary | ICD-10-CM | POA: Diagnosis not present

## 2020-12-11 NOTE — Progress Notes (Signed)
Carelink Summary Report / Loop Recorder 

## 2020-12-17 ENCOUNTER — Ambulatory Visit (INDEPENDENT_AMBULATORY_CARE_PROVIDER_SITE_OTHER): Payer: Medicare Other | Admitting: Dermatology

## 2020-12-17 ENCOUNTER — Ambulatory Visit: Payer: Medicare Other | Admitting: Occupational Therapy

## 2020-12-17 ENCOUNTER — Other Ambulatory Visit: Payer: Self-pay

## 2020-12-17 DIAGNOSIS — I890 Lymphedema, not elsewhere classified: Secondary | ICD-10-CM | POA: Diagnosis not present

## 2020-12-17 DIAGNOSIS — I872 Venous insufficiency (chronic) (peripheral): Secondary | ICD-10-CM

## 2020-12-17 MED ORDER — MOMETASONE FUROATE 0.1 % EX CREA: TOPICAL_CREAM | CUTANEOUS | 1 refills | Status: DC

## 2020-12-17 MED ORDER — TACROLIMUS 0.1 % EX OINT: TOPICAL_OINTMENT | CUTANEOUS | 2 refills | Status: DC

## 2020-12-17 NOTE — Progress Notes (Signed)
   New Patient Visit  Subjective  Sharon [name] is a 75 y.o. female who presents for the following: Rash (Patient here to have rash on lower legs evaluated. She has had problems with lymphedema and cellulitis since 2019. Patient goes to the vascular surgeon (Dr Lucky Cowboy) and is on PCN 250mg  BID for cellulitis. She has lymphedema and has treatments at physical therapy at [facility]. She has a pump at home that she uses. Her legs have been bothering her more the last several months with stinging. She wears compression knee highs 67mm/Hg daily.).  She is not on diuretics. She denies having CHF, but she has had a stroke. She was getting PT for Lymphedema and was doing well, but stopped it in January, after which stinging/burning rash came up.  The following portions of the chart were reviewed this encounter and updated as appropriate:       Review of Systems:  No other skin or systemic complaints except as noted in HPI or Assessment and Plan.  Objective  Well appearing patient in no apparent distress; mood and affect are within normal limits.  A focused examination was performed including lower legs. Relevant physical exam findings are noted in the Assessment and Plan.  Objective  lower legs: Erythema with peau d'orange appearance and 2+ pitting edema.  No evidence of cellulitis at this time.   Assessment & Plan  Venous stasis dermatitis of both lower extremities lower legs  With lymphedema  Stasis in the legs causes chronic leg swelling, which may result in itchy or painful rashes, skin discoloration, skin texture changes, and sometimes ulceration.  Recommend daily compression hose/stockings- easiest to put on first thing in morning, remove at bedtime.  Elevate legs as much as possible. Avoid salt/sodium rich foods.  Recommend patient discuss starting diuretic with PCP. Recommend patient restarting physical therapy with Andrey Spearman 2x/wk. Will send referral. D/C penicillin- no evidence of  cellulitis  Start mometasone cream Apply to lower legs BID until rash improved and prn flares dsp 50g 1RF. Do not use longer than 1 month at a time. Avoid face, groin, axilla.  Start tacrolimus ointment Apply to rash on lower legs qd/bid until improved dsp 100g 2Rf.  Topical steroids (such as triamcinolone, fluocinolone, fluocinonide, mometasone, clobetasol, halobetasol, betamethasone, hydrocortisone) can cause thinning and lightening of the skin if they are used for too long in the same area. Your physician has selected the right strength medicine for your problem and area affected on the body. Please use your medication only as directed by your physician to prevent side effects.      mometasone (ELOCON) 0.1 % cream - lower legs  tacrolimus (PROTOPIC) 0.1 % ointment - lower legs  Return in about 6 weeks (around 01/28/2021) for Stasis Dermatitis.  IJamesetta Orleans, CMA, am acting as scribe for Brendolyn Patty, MD .  Documentation: I have reviewed the above documentation for accuracy and completeness, and I agree with the above.  Brendolyn Patty MD

## 2020-12-17 NOTE — Patient Instructions (Addendum)
Stasis in the legs causes chronic leg swelling, which may result in itchy or painful rashes, skin discoloration, skin texture changes, and sometimes ulceration.  Recommend daily compression hose/stockings- easiest to put on first thing in morning, remove at bedtime.  Elevate legs as much as possible. Avoid salt/sodium rich foods.  Discuss with PCP about starting diuretic/fluid pill.  Start mometasone cream - Apply to lower legs twice daily until rash improved. Use no longer than 1 month at a time.  Start tacrolimus ointment - Apply to lower legs 1-2 times a day until rash improved.  Topical steroids (such as triamcinolone, fluocinolone, fluocinonide, mometasone, clobetasol, halobetasol, betamethasone, hydrocortisone) can cause thinning and lightening of the skin if they are used for too long in the same area. Your physician has selected the right strength medicine for your problem and area affected on the body. Please use your medication only as directed by your physician to prevent side effects.

## 2020-12-18 ENCOUNTER — Other Ambulatory Visit: Payer: Self-pay

## 2020-12-18 DIAGNOSIS — I890 Lymphedema, not elsewhere classified: Secondary | ICD-10-CM

## 2020-12-19 ENCOUNTER — Other Ambulatory Visit: Payer: Self-pay | Admitting: Nurse Practitioner

## 2020-12-19 ENCOUNTER — Telehealth: Payer: Self-pay

## 2020-12-19 MED ORDER — FUROSEMIDE 20 MG PO TABS: 10.0000 mg | ORAL_TABLET | Freq: Every day | ORAL | 3 refills | Status: DC

## 2020-12-19 MED ORDER — CLOPIDOGREL BISULFATE 75 MG PO TABS: ORAL_TABLET | ORAL | 4 refills | Status: DC

## 2020-12-19 NOTE — Telephone Encounter (Signed)
Tacrolimus not covered by either of pts insurances. Pt requested alternative.

## 2020-12-19 NOTE — Telephone Encounter (Signed)
Can send in Eucrisa ointment or generic Elidel cream in place of tacrolimus ointment, both non-steroids and safe to use on a long term basis.  If that isn't covered, then she will just use the mometasone steroid cream that has been prescribed Prn itchy/burning rash, and when improved switch to OTC moisturizing cream.  She shouldn't use the steroid cream long term due to risk skin thinning.

## 2020-12-20 ENCOUNTER — Other Ambulatory Visit: Payer: Self-pay

## 2020-12-20 ENCOUNTER — Ambulatory Visit (INDEPENDENT_AMBULATORY_CARE_PROVIDER_SITE_OTHER): Payer: Medicare Other

## 2020-12-20 DIAGNOSIS — K140 Glossitis: Secondary | ICD-10-CM

## 2020-12-20 MED ORDER — CYANOCOBALAMIN 1000 MCG/ML IJ SOLN
1000.0000 ug | Freq: Once | INTRAMUSCULAR | Status: AC
  Administered 2020-12-20: 1000 ug via INTRAMUSCULAR

## 2020-12-26 ENCOUNTER — Encounter: Payer: Self-pay | Admitting: Dermatology

## 2020-12-26 NOTE — Telephone Encounter (Signed)
Sharon [name] has tried PAs on everything from Tacrolimus, Elidel and [place]. All denied.   Left message for patient to return my call to advise her of Dr. Nicole Kindred information per last phone call note.

## 2020-12-27 ENCOUNTER — Telehealth: Payer: Self-pay

## 2020-12-27 ENCOUNTER — Other Ambulatory Visit: Payer: Self-pay

## 2020-12-27 ENCOUNTER — Ambulatory Visit: Payer: Medicare Other | Attending: Nurse Practitioner | Admitting: Occupational Therapy

## 2020-12-27 DIAGNOSIS — I890 Lymphedema, not elsewhere classified: Secondary | ICD-10-CM | POA: Insufficient documentation

## 2020-12-27 MED ORDER — TACROLIMUS 0.1 % EX OINT: TOPICAL_OINTMENT | Freq: Two times a day (BID) | CUTANEOUS | 0 refills | Status: DC

## 2020-12-27 NOTE — Telephone Encounter (Signed)
Pt called she would like Tacrolimus sent into [facility], erx'd Tacrolimus to [facility]

## 2020-12-28 NOTE — Patient Instructions (Signed)

## 2020-12-28 NOTE — Therapy (Signed)
[place] MAIN [facility] SERVICES [address] [place], [name], [address] Phone: [phone]   Fax:  [phone]  Occupational Therapy Treatment Note and Progress Report  Patient Details  Name: Sharon [name] MRN: [id number] Date of Birth: Dec 12, 1945 No data recorded  Encounter Date: 12/27/2020   OT End of Session - 12/27/20 1428    Visit Number 51    Number of Visits 83    Date for OT Re-Evaluation 02/10/21    OT Start Time 0215    OT Stop Time 0310    OT Time Calculation (min) 55 min    Activity Tolerance Patient tolerated treatment well;No increased pain    Behavior During Therapy WFL for tasks assessed/performed           Past Medical History:  Diagnosis Date  . Anxiety   . Arthritis    hands, upper back  . Asthma   . COPD (chronic obstructive pulmonary disease) ([place])   . History of cervical cancer   . Menopausal disorder   . Osteoporosis   . Pneumonia 1960  . Spasm of abdominal muscles of right side    intermittent  . TMJ (dislocation of temporomandibular joint)   . Wears dentures    partial lower    Past Surgical History:  Procedure Laterality Date  . ABDOMINAL HYSTERECTOMY  1970's  . bladder botox  2005  . BLADDER SUSPENSION  2004  . CATARACT EXTRACTION W/PHACO Right 03/09/2018   Procedure: CATARACT EXTRACTION PHACO AND INTRAOCULAR LENS PLACEMENT ([place]) right;  Surgeon: Eulogio Bear, MD;  Location: [place];  Service: Ophthalmology;  Laterality: Right;  CALL CELL 1ST  . CATARACT EXTRACTION W/PHACO Left 04/19/2018   Procedure: CATARACT EXTRACTION PHACO AND INTRAOCULAR LENS PLACEMENT (IOC)  LEFT;  Surgeon: Eulogio Bear, MD;  Location: [place];  Service: Ophthalmology;  Laterality: Left;  . COLONOSCOPY WITH PROPOFOL N/A 12/13/2015   Procedure: COLONOSCOPY WITH PROPOFOL;  Surgeon: Lucilla Lame, MD;  Location: [place];  Service: Endoscopy;  Laterality: N/A;  . LOOP RECORDER INSERTION  N/A 01/07/2018   Procedure: LOOP RECORDER INSERTION;  Surgeon: Deboraha Sprang, MD;  Location: [place] [facility];  Service: Cardiovascular;  Laterality: N/A;  . POLYPECTOMY N/A 12/13/2015   Procedure: POLYPECTOMY;  Surgeon: Lucilla Lame, MD;  Location: [place];  Service: Endoscopy;  Laterality: N/A;  SIGMOID COLON POLYPS X  5  . SHOULDER ARTHROSCOPY W/ ROTATOR CUFF REPAIR Right 1998  . TEE WITHOUT CARDIOVERSION N/A 01/06/2018   Procedure: TRANSESOPHAGEAL ECHOCARDIOGRAM (TEE);  Surgeon: Minna Merritts, MD;  Location: [facility];  Service: Cardiovascular;  Laterality: N/A;  . TONSILLECTOMY AND ADENOIDECTOMY      There were no vitals filed for this visit.   Subjective Assessment - 12/28/20 1000    Subjective  [name] returns today for scheduled follow up-OT visit 30 of 27 -to address BLE phlebo-lymphedema. Pt presents with BLE knee length ccl 1 ( 20-30 mmHg) compression stockings in place.  Pt reports fluctuating leg swelling and associated pain/ discomfort since last seen on 11/12/20. She but does not rate pain numerically.  Delana arrives with a new referral today from Brendolyn Patty, MD of [place]. Demetri reports she started a diuretic medication since last seen.    Pertinent History Hx anxiety, COPD, OA, Asthma, hx cervical ca, osteoporosis, hx pneumonia, hx CVA, hx LLE cellulitis, hx L leg wound, hx falls    Limitations chronic BLE leg swelling and  associated pain, decreased balance, fall risk, decreased standing, walking and sitting tolerance, difficulty w/ lower body bathing and dressing, difficulty fitting LB clothing and shoes, unable to drive, impaired transfers and functional mobility, impaired social participation, impaired ability to perform household chores, cooking, meal prep    Repetition Increases Symptoms    Special Tests 11/20 dopler negative for DVT and CVI    Pain Onset --               LYMPHEDEMA/ONCOLOGY QUESTIONNAIRE - 12/28/20 1230      Right  Lower Extremity Lymphedema   Other RLE limb volume from ankle to tibial tuberosity = 3137.2 ml.    Other RLE A-D limb volume is decreased 4.5% since last measured on 08/22/20. Marland Kitchen      Left Lower Extremity Lymphedema   Other LLE (Rx limb) A-D volume measures 3215.3 ml. LLE A-D limb volume is increased by 0.9% since last measured on 08/22/20    Other Limb volume differential (LVD) measures 2.49%, L>R, which is WNL                   OT Treatments/Exercises (OP) - 12/28/20 1227      ADLs   ADL Education Given Yes      Manual Therapy   Manual Therapy Edema management    Edema Management BLE comparative limb volumetrics    Compression Bandaging Pt dons and doffs ccl 1 compression stockings with extra time (modified assistance)                  OT Education - 12/27/20 1248    Education Details Continued Pt/ CG edu for lymphedema self care  and home program throughout session. Topics include multilayer, gradient compression wrapping, simple self-MLD, therapeutic lymphatic pumping exercises, skin/nail care, risk reduction factors and LE precautions, compression garments/recommendations and wear and care schedule and compression garment donning / doffing using assistive devices. All questions answered to the Pt's satisfaction, and Pt demonstrates understanding by report.    Person(s) Educated Patient    Methods Explanation;Demonstration    Comprehension Verbalized understanding;Returned demonstration               OT Long Term Goals - 12/27/20 0959      OT LONG TERM GOAL #1   Title Pt will demonstrate understanding of lymphedema (LE) precautions / prevention principals, including signs / symptoms of cellulitis infection with modified independence using LE Workbook as printed reference to identify 6 precautions without verbal cues by end of 3rd  OT Rx visit.     Time 3    Period Days    Status Achieved      OT LONG TERM GOAL #2   Title Pt will be able todon and doff  existing compression garments independently and without assistive devices and/ or extra time for optimal lymphedema self management over time and to limit infection risk.    Baseline supervision    Time 4    Period Days    Status Achieved      OT LONG TERM GOAL #3   Title Pt to achieve at least  10% limb volume reduction in affected limb(s)  bilaterally during Intensive Phase CDT to control limb swelling, to improve tissue integrity and immune function, to improve ADLs performance and to improve functional mobility/ transfer, and to improve body image and self-esteem.    Baseline max A    Time 8    Period Weeks    Status Partially Met  RLE decreased 4.5% since 08/22/20. LLE stable with 0.9% increase snce 08/22/20.     OT LONG TERM GOAL #4   Title Pt will achieve 100% compliance with daily LE self-care home program components, including daily  skin care, simple self-MLD, gradient compression wraps/ compression garments/devices, and therapeutic exercise with Mod CG support to ensure optimal Intensive Phase limb volume reduction to expedite compression garment/ device fitting.    Baseline 50% compliant    Time 12    Period Weeks    Status Achieved   met by report     OT LONG TERM GOAL #5   Title Pt will use appropriate  assistive devices during LE self-care training to don compression garments/devices with modified independence to ensure optimal LE self-management over time and to limit progression of chronic LE.    Baseline min A    Time 12    Period Weeks    Status Achieved   DC goal     OT LONG TERM GOAL #6   Title Pt will retain optimal limb volume reductions achieved during Intensive Phase CDT with no more than 3% volume increase with ongoing CG assistance to limit LE progression and further functional decline.    Baseline min A    Time 6    Period Months    Status Achieved                 Plan - 12/28/20 1234    Clinical Impression Statement Completed BLE comparative  limb volumetrics. RLE A-D limb volume is decreased 4.5% since last measured on 08/22/20. LLE A-D limb volume is essentially stable with a 0.9% increase since last measured on 08/22/20. Limb voume differential measures 2.5%, L>R, which is essentially WNL. Skin is well hydrated without signs/symptoms of infection. Lipodermatosclerosis persists and both legs remain reddened at distal lateral legs which is typical for Mrs. Shahin. Stemmer sign is negative at base of toes. Spuerficial, watery edema and 2+ pitting is observed bilaterally , which was not observed at last visit. Overall limb swelling and tissue integrity is well managed with skills, tools and strategies learned in OT as LE self care home program, including simple self-MLD, skin care, ther ex and compression strategies and devices. Pt has a basic sequential pneumatic compression device, or "pump" , which Pt started using recently on alternating legs, Pt has achieved all treatment goals and has successfully transitioned from Intensive Phase Complete Decongestive Therapy (CDT) to Self-Management Phase CDT. Pt encouraged to replace her ccl 1 (20-30 mmHg) stockings with her existing ccl 2 (30-40 mmHg) stockings and ensure she dons garments first thing upon risin in the morning. She is reminded to remain active to encourage the muscle pumps in her legs to circulate lymphatic fluid. She is encouraged to elevate her leg when seated and to work lymphatic pumping ther ex learned in OT into ger day. Pt agrees with plan and wiol return for follow-along in 6 -8 weeks. Pt encouraged to call w concerns/ questions PRN.    Occupational performance deficits (Please refer to evaluation for details): ADL's;Leisure;IADL's;Social Participation;Rest and Sleep;Work           Patient will benefit from skilled therapeutic intervention in order to improve the following deficits and impairments:           Visit Diagnosis: Lymphedema, not elsewhere  classified    Problem List Patient Active Problem List   Diagnosis Date Noted  . Drug-induced myopathy 09/13/2020  . Neuropathy of both feet 05/24/2020  .  Hyperlipidemia associated with type 2 diabetes mellitus ([place]) 02/14/2020  . Obesity 02/14/2020  . Vitamin D deficiency 06/28/2019  . Type 2 diabetes mellitus with obesity ([place]) 06/28/2019  . GERD (gastroesophageal reflux disease) 12/07/2018  . Vitamin B12 deficiency 12/05/2018  . Senile purpura ([place]) 07/28/2018  . Lymphedema 07/14/2018  . Aneurysm of descending thoracic aorta (HCC) 05/04/2018  . Allergic rhinitis 02/16/2018  . Aortic atherosclerosis ([place]) 01/13/2018  . Status post placement of implantable loop recorder 01/12/2018  . Chronic kidney disease, stage 3 ([place]) 01/12/2018  . History of CVA (cerebrovascular accident)   . TIA (transient ischemic attack) 01/04/2018  . CAD (coronary artery disease) 11/25/2017  . Caregiver stress 11/25/2017  . Menopause 08/14/2017  . De Quervain's tenosynovitis, right 07/17/2017  . Stress incontinence 06/19/2017  . Personal history of tobacco use, presenting hazards to health 04/01/2017  . Anxiety 03/13/2017  . Advanced care planning/counseling discussion 10/17/2016  . Benign neoplasm of sigmoid colon   . Centrilobular emphysema ([place]) 10/17/2015  . Mass of upper inner quadrant of right breast 06/22/2015   Andrey Spearman, MS, OTR/L, [facility] 12/28/20 12:51 PM  [place] MAIN [facility] SERVICES [address] [place], [name], [address] Phone: [phone]   Fax:  [phone]  Name: MITSUYE SCHRODT MRN: [id number] Date of Birth: 1946/08/24

## 2021-01-06 LAB — CUP PACEART REMOTE DEVICE CHECK
Date Time Interrogation Session: 20220319043000
Implantable Pulse Generator Implant Date: 20190328

## 2021-01-07 ENCOUNTER — Ambulatory Visit (INDEPENDENT_AMBULATORY_CARE_PROVIDER_SITE_OTHER): Payer: Medicare Other

## 2021-01-07 ENCOUNTER — Telehealth: Payer: Self-pay | Admitting: Nurse Practitioner

## 2021-01-07 DIAGNOSIS — I6310 Cerebral infarction due to embolism of unspecified precerebral artery: Secondary | ICD-10-CM

## 2021-01-07 NOTE — Telephone Encounter (Signed)
Routing to provider  

## 2021-01-07 NOTE — Telephone Encounter (Signed)
She is aware of recent diabetic range A1c as we had discussed this a lot.  Not currently on medication, as did not tolerate.  She is aware though.

## 2021-01-07 NOTE — Telephone Encounter (Signed)
Sharon [name] from [facility] is [name] to report that the pt has had an eye center 10/20/19. However, it was not for a diabetic eye exam. Pt does not know that she is diabetic. So that was not reported to [facility]. Is a diabetic eye exam needed?  Pt reports that she is not diabetic. Pt needs be told that she is diabetic. [phone]

## 2021-01-11 ENCOUNTER — Encounter: Payer: Self-pay | Admitting: Nurse Practitioner

## 2021-01-11 DIAGNOSIS — E1351 Other specified diabetes mellitus with diabetic peripheral angiopathy without gangrene: Secondary | ICD-10-CM | POA: Insufficient documentation

## 2021-01-11 DIAGNOSIS — M85852 Other specified disorders of bone density and structure, left thigh: Secondary | ICD-10-CM | POA: Insufficient documentation

## 2021-01-15 ENCOUNTER — Ambulatory Visit (INDEPENDENT_AMBULATORY_CARE_PROVIDER_SITE_OTHER): Payer: Medicare Other | Admitting: Nurse Practitioner

## 2021-01-15 ENCOUNTER — Encounter: Payer: Self-pay | Admitting: Nurse Practitioner

## 2021-01-15 ENCOUNTER — Other Ambulatory Visit: Payer: Self-pay

## 2021-01-15 VITALS — BP 120/71 | HR 83 | Temp 98.2°F | Wt 217.4 lb

## 2021-01-15 DIAGNOSIS — M85852 Other specified disorders of bone density and structure, left thigh: Secondary | ICD-10-CM

## 2021-01-15 DIAGNOSIS — E538 Deficiency of other specified B group vitamins: Secondary | ICD-10-CM | POA: Diagnosis not present

## 2021-01-15 DIAGNOSIS — E785 Hyperlipidemia, unspecified: Secondary | ICD-10-CM

## 2021-01-15 DIAGNOSIS — G720 Drug-induced myopathy: Secondary | ICD-10-CM

## 2021-01-15 DIAGNOSIS — D519 Vitamin B12 deficiency anemia, unspecified: Secondary | ICD-10-CM | POA: Diagnosis not present

## 2021-01-15 DIAGNOSIS — N1831 Chronic kidney disease, stage 3a: Secondary | ICD-10-CM

## 2021-01-15 DIAGNOSIS — E1169 Type 2 diabetes mellitus with other specified complication: Secondary | ICD-10-CM

## 2021-01-15 DIAGNOSIS — E559 Vitamin D deficiency, unspecified: Secondary | ICD-10-CM | POA: Diagnosis not present

## 2021-01-15 DIAGNOSIS — I890 Lymphedema, not elsewhere classified: Secondary | ICD-10-CM

## 2021-01-15 DIAGNOSIS — E1351 Other specified diabetes mellitus with diabetic peripheral angiopathy without gangrene: Secondary | ICD-10-CM | POA: Diagnosis not present

## 2021-01-15 DIAGNOSIS — I700 Atherosclerosis of aorta: Secondary | ICD-10-CM

## 2021-01-15 DIAGNOSIS — I712 Thoracic aortic aneurysm, without rupture: Secondary | ICD-10-CM

## 2021-01-15 DIAGNOSIS — Z6832 Body mass index (BMI) 32.0-32.9, adult: Secondary | ICD-10-CM

## 2021-01-15 DIAGNOSIS — D692 Other nonthrombocytopenic purpura: Secondary | ICD-10-CM | POA: Diagnosis not present

## 2021-01-15 DIAGNOSIS — E669 Obesity, unspecified: Secondary | ICD-10-CM

## 2021-01-15 DIAGNOSIS — Z87891 Personal history of nicotine dependence: Secondary | ICD-10-CM

## 2021-01-15 DIAGNOSIS — J432 Centrilobular emphysema: Secondary | ICD-10-CM

## 2021-01-15 DIAGNOSIS — I7123 Aneurysm of the descending thoracic aorta, without rupture: Secondary | ICD-10-CM

## 2021-01-15 DIAGNOSIS — Z1211 Encounter for screening for malignant neoplasm of colon: Secondary | ICD-10-CM

## 2021-01-15 DIAGNOSIS — F419 Anxiety disorder, unspecified: Secondary | ICD-10-CM

## 2021-01-15 DIAGNOSIS — I2510 Atherosclerotic heart disease of native coronary artery without angina pectoris: Secondary | ICD-10-CM

## 2021-01-15 DIAGNOSIS — E6609 Other obesity due to excess calories: Secondary | ICD-10-CM

## 2021-01-15 LAB — MICROALBUMIN, URINE WAIVED
Creatinine, Urine Waived: 100 mg/dL (ref 10–300)
Microalb, Ur Waived: 10 mg/L (ref 0–19)
Microalb/Creat Ratio: <30 mg/g (ref <30)

## 2021-01-15 MED ORDER — CYANOCOBALAMIN 1000 MCG/ML IJ SOLN
1000.0000 ug | Freq: Once | INTRAMUSCULAR | Status: AC
  Administered 2021-01-15: 1000 ug via INTRAMUSCULAR

## 2021-01-15 NOTE — Assessment & Plan Note (Signed)
Did not tolerate Rosuvastatin 20 MG three days a week.  Have tried multiple statins with ongoing myalgias with use.  Discussed with patient could consider PSK9 -- educated her on this.  Check lipid panel and CMP today.  CCM collaboration continues.

## 2021-01-15 NOTE — Patient Instructions (Signed)
Lymphedema  Lymphedema is swelling that is caused by the abnormal collection of lymph in the tissues under the skin. Lymph is excess fluid from the tissues in your body that is removed through the lymphatic system. This system is part of your body's defense system (immune system) and includes lymph nodes and lymph vessels. The lymph vessels collect and carry the excess fluid, fats, proteins, and waste from the tissues of the body to the bloodstream. This system also works to clean and remove bacteria and waste products from the body. Lymphedema occurs when the lymphatic system is blocked. When the lymph vessels or lymph nodes are blocked or damaged, lymph does not drain properly. This causes an abnormal buildup of lymph, which leads to swelling in the affected area. This may include the trunk area, or an arm or leg. Lymphedema cannot be cured by medicines, but various methods can be used to help reduce the swelling. What are the causes? The cause of this condition depends on the type of lymphedema that you have.  Primary lymphedema is caused by the absence of lymph vessels or having abnormal lymph vessels at birth.  Secondary lymphedema occurs when lymph vessels are blocked or damaged. Secondary lymphedema is more common. Common causes of lymph vessel blockage include: ? Skin infection, such as cellulitis. ? Infection by parasites (filariasis). ? Injury. ? Radiation therapy. ? Cancer. ? Formation of scar tissue. ? Surgery. What are the signs or symptoms? Symptoms of this condition include:  Swelling of the arm or leg.  A heavy or tight feeling in the arm or leg.  Swelling of the feet, toes, or fingers. Shoes or rings may fit more tightly than before.  Redness of the skin over the affected area.  Limited movement of the affected limb.  Sensitivity to touch or discomfort in the affected limb. How is this diagnosed? This condition may be diagnosed based on:  Your symptoms and medical  history.  A physical exam.  Bioimpedance spectroscopy. In this test, painless electrical currents are used to measure fluid levels in your body.  Imaging tests, such as: ? MRI. ? CT scan. ? Duplex ultrasound. This test uses sound waves to produce images of the vessels and the blood flow on a screen. ? Lymphoscintigraphy. In this test, a low dose of a radioactive substance is injected to trace the flow of lymph through your lymph vessels. ? Lymphangiography. In this test, a contrast dye is injected into the lymph vessel to help show blockages. How is this treated? If an underlying condition is causing the lymphedema, that condition will be treated. For example, antibiotic medicines may be used to treat an infection. Treatment for this condition will depend on the cause of your lymphedema. Treatment may include:  Complete decongestive therapy (CDT). This is done by a certified lymphedema therapist to reduce fluid congestion. This therapy includes: ? Skin care. ? Compression wrapping of the affected area. ? Manual lymph drainage. This is a special massage technique that promotes lymph drainage out of a limb. ? Specific exercises. Certain exercises can help fluid move out of the affected limb.  Compression. Various methods may be used to apply pressure to the affected limb to reduce the swelling. They include: ? Wearing compression stockings or sleeves on the affected limb. ? Wrapping the affected limb with special bandages.  Surgery. This is usually done for severe cases only. For example, surgery may be done if you have trouble moving the limb or if the swelling does not  get better with other treatments.   Follow these instructions at home: Self-care  The affected area is more likely to become injured or infected. Take these steps to help prevent infection: ? Keep the affected area clean and dry. ? Use approved creams or lotions to keep the skin moisturized. ? Protect your skin from  cuts:  Use gloves while cooking or gardening.  Do not walk barefoot.  If you shave the affected area, use an Copy.  Do not wear tight clothes, shoes, or jewelry.  Eat a healthy diet that includes a lot of fruits and vegetables. Activity  Do exercises as told by your health care provider.  Do not sit with your legs crossed.  When possible, keep the affected limb raised (elevated) above the level of your heart.  Avoid carrying things with an arm that is affected by lymphedema. General instructions  Wear compression stockings or sleeves as told by your health care provider.  Note any changes in size of the affected limb. You may be instructed to take regular measurements and keep track of them.  Take over-the-counter and prescription medicines only as told by your health care provider.  If you were prescribed an antibiotic medicine, take or apply it as told by your health care provider. Do not stop using the antibiotic even if you start to feel better or if your condition improves.  Do not use heating pads or ice packs on the affected area.  Avoid having blood draws, IV insertions, or blood pressure checks on the affected limb.  Keep all follow-up visits. This is important. Contact a health care provider if you:  Continue to have swelling in your limb.  Have fluid leaking from the skin of your swollen limb.  Have a cut that does not heal.  Have redness or pain in the affected area.  Develop purplish spots, rash, blisters, or sores (lesions) on your affected limb. Get help right away if you:  Have new swelling in your limb that starts suddenly.  Have shortness of breath or chest pain.  Have a fever or chills. These symptoms may represent a serious problem that is an emergency. Do not wait to see if the symptoms will go away. Get medical help right away. Call your local emergency services (911 in the [place] Do not drive yourself to the  hospital. Summary  Lymphedema is swelling that is caused by the abnormal collection of lymph in the tissues under the skin.  Lymph is fluid from the tissues in your body that is removed through the lymphatic system. This system collects and carries excess fluid, fats, proteins, and wastes from the tissues of the body to the bloodstream.  Lymphedema causes swelling, pain, and redness in the affected area. This may include the trunk area, or an arm or leg.  Treatment for this condition may depend on the cause of your lymphedema. Treatment may include treating the underlying cause, complete decongestive therapy (CDT), compression methods, or surgery. This information is not intended to replace advice given to you by your health care provider. Make sure you discuss any questions you have with your health care provider. Document Revised: 07/25/2020 Document Reviewed: 07/25/2020 Elsevier Patient Education  2021 [employer].

## 2021-01-15 NOTE — Assessment & Plan Note (Signed)
Recommended eating smaller high protein, low fat meals more frequently and exercising 30 mins a day 5 times a week with a goal of 10-15lb weight loss in the next 3 months. Patient voiced their understanding and motivation to adhere to these recommendations.  

## 2021-01-15 NOTE — Assessment & Plan Note (Signed)
Chronic, ongoing.  Continue use of compression hose daily.  Continue collaboration with vascular team.  Appreciate their input.  Will discuss with them alternate locations for massage or OT therapy, as patient benefits from this and reports she was told she did not need to return to current location.  Return in 6 months or sooner if s/s infection.

## 2021-01-15 NOTE — Assessment & Plan Note (Signed)
Chronic, ongoing. Did not tolerate Rosuvastatin 20 MG three days a week.  Have tried multiple statins with ongoing myalgias with use.  Discussed with patient could consider PSK9 -- educated her on this.  Check lipid panel and CMP today.  CCM collaboration continues.

## 2021-01-15 NOTE — Assessment & Plan Note (Signed)
Noted on DEXA 08/05/2017.  Will plan repeat 08/05/2022.  Continue Vitamin D supplement daily and adequate calcium intake.  Check Vit D today.

## 2021-01-15 NOTE — Assessment & Plan Note (Signed)
Chronic, stable with no current symptoms.  Continue current medication regimen and collaboration with cardiology. 

## 2021-01-15 NOTE — Assessment & Plan Note (Signed)
Followed by vascular, last CT scan showed no significant increase in size.  Due for CT scan lung yearly.  Continue to collaborate with vascular.

## 2021-01-15 NOTE — Assessment & Plan Note (Addendum)
Chronic, ongoing with recent A1c 6.5%.  Continue Metformin 500 MG daily, which she is tolerating. Recommend she monitor BS daily at home.  Urine ALB (urine ALB 10 on check) and A1c today.  Return to office 6 months, sooner if medication changes are made.

## 2021-01-15 NOTE — Assessment & Plan Note (Signed)
Chronic, stable.  Continue monthly injections, recheck B12 level today.

## 2021-01-15 NOTE — Assessment & Plan Note (Signed)
Chronic, stable, only uses PRN.  Continue daily Citalopram and monitor.  Adjust dose or medication as needed, do not increase Celexa to 40 MG due to patient age >57.  Denies SI/HI.

## 2021-01-15 NOTE — Assessment & Plan Note (Addendum)
Chronic, ongoing with lymphedema and PVD.  Continue to collaborate with vascular and current medication regimen.  A1c today.  Continue diet focus for diabetes control and once daily Metformin.

## 2021-01-15 NOTE — Assessment & Plan Note (Signed)
Chronic, ongoing.  Noted on CT scan 04/19/2019.  Continue statin for prevention and Plavix.  Monitor closely.

## 2021-01-15 NOTE — Assessment & Plan Note (Addendum)
Chronic, stable with no decline on recent labs.  Renal dose medications as needed. CMP and urine ALB today -- urine ALB 10 on check.  For worsening would refer to nephrology.

## 2021-01-15 NOTE — Assessment & Plan Note (Signed)
Chronic, ongoing.  Continue current medication regimen and adjust as needed.  May benefit from change to [facility] for once daily dosing and LAMA/LABA combo.  Obtain spirometry next visit. Continue to collaborate with CCM team.  Continue annual lung screening.  Return in 6 months.

## 2021-01-15 NOTE — Assessment & Plan Note (Signed)
On daily Plavix.  Recommend cleansing skin with gentle cleanser and use of daily lotion.  Monitor for skin breakdown and address if present. 

## 2021-01-15 NOTE — Assessment & Plan Note (Signed)
Continue yearly lung CT screening.

## 2021-01-15 NOTE — Progress Notes (Signed)
BP 120/71   Pulse 83   Temp 98.2 F (36.8 C) (Oral)   Wt 217 lb 6.4 oz (98.6 kg)   LMP  (LMP Unknown)   SpO2 97%   BMI 32.10 kg/m    Subjective:    Patient ID: Sharon [name], female    DOB: 1946-07-26, 75 y.o.   MRN: [id number]  HPI: Sharon [name] is a 75 y.o. female  Chief Complaint  Patient presents with  . Hyperlipidemia    Patient states she would like to discuss the Crestor prescription at today's visit.   Marland Kitchen Lymphedema  . COPD  . Mood   COPD Continues on Symbicort daily and Albuterol PRN. COPD status:stable Satisfied with current treatment?:yes Oxygen use:no Dyspnea frequency:intermittent Cough frequency:intermittent Rescue inhaler frequency:  Limitation of activity:no Productive cough: none Last Spirometry:unknown Pneumovax:Up to Date Influenza:Up to Date  CHRONIC KIDNEY DISEASE Recent labs noted GFR 58.   CKD status: stable Medications renally dose: yes Previous renal evaluation: no Pneumovax:  Up to Date Influenza Vaccine:  Up to Date   HYPERLIPIDEMIA Was on Crestor 20 MG three times weekly, but stopped this due to muscle and joint pains all over -- these have improved with stopping medication.  Has had trials of multiple statins at this time.  Last saw Dr. Caryl Comes in November 2021 for CAD and TIA. Hyperlipidemia status: good compliance Satisfied with current treatment?  yes Side effects:  no Medication compliance: good compliance Supplements: none Aspirin:  no The ASCVD Risk score Mikey Bussing DC Jr., et al., 2013) failed to calculate for the following reasons:   The patient has a prior MI or stroke diagnosis Chest pain:  no Coronary artery disease:  no Family history CAD:  yes, mother had MI Family history early CAD:  no   DIABETES Recent A1C 07/17/20 was 6.5%, she wished to focus on diet. Blood sugars at home 125 to 126 most days.  Has started taking Metformin 500 MG daily at this time.   Polydipsia/polyuria: no Visual disturbance:  no Chest pain: no Paresthesias: no  LYMPHEDEMA WITH CHRONIC VENOUS INSUFFICIENCY: Saw vascular last 09/11/20.Was going to OT, ordered by dermatology she reports (Dr. Nicole Kindred) and reports benefit from + using massage pump -- patient reports after her 12/27/20 visit that OT "turned me down and said I did not need it" and OT stated "I do not do maintenance work".States OT will not see her once a week for therapy, which she was frustrated by.  She reports her discomfort improves with massage and therapy, has noticed a difference without it.  Does endorse difficulty being off feet, due to caring for husband who receives dialysis 3 times a week. Does take Tylenol for pain, which benefits at times.  Vascular also monitors patient aneurysm thoracic aorta.  VITAMIN B12 DEFICIENCY: Continues on monthly injections. Last level in October >2000. Has Vitamin D deficiency, continues supplement with recent level 15.4.  DEPRESSION Continues on Celexa 20 MG as needed for mood. Mood status: stable Satisfied with current treatment?: yes Symptom severity: mild  Duration of current treatment : chronic Side effects: no Medication compliance: good compliance Psychotherapy/counseling: none Depressed mood: no Anxious mood: no Anhedonia: no Significant weight loss or gain: no Insomnia: none Fatigue: no Feelings of worthlessness or guilt: no Impaired concentration/indecisiveness: no Suicidal ideations: no Hopelessness: no Crying spells: no Depression screen [facility] 2/9 07/17/2020 04/09/2020 06/28/2019  Decreased Interest 0 0 1  Down, Depressed, Hopeless 0 0 1  PHQ - 2 Score 0 0  2  Altered sleeping 1 - 0  Tired, decreased energy 1 - 1  Change in appetite 0 - 0  Feeling bad or failure about yourself  0 - 0  Trouble concentrating 0 - 0  Moving slowly or fidgety/restless 0 - 0  Suicidal thoughts 0 - 0  PHQ-9 Score 2 - 3  Difficult doing work/chores Not difficult at all - Not difficult at all  Some recent  data might be hidden    Relevant past medical, surgical, family and social history reviewed and updated as indicated. Interim medical history since our last visit reviewed. Allergies and medications reviewed and updated.  Review of Systems  Constitutional: Negative for activity change, appetite change, diaphoresis, fatigue and fever.  Respiratory: Negative for cough, chest tightness and shortness of breath.   Cardiovascular: Positive for leg swelling (baseline). Negative for chest pain and palpitations.  Gastrointestinal: Negative for abdominal distention, abdominal pain, constipation, diarrhea, nausea and vomiting.  Neurological: Negative for dizziness, syncope, weakness, light-headedness, numbness and headaches.  Psychiatric/Behavioral: Negative.     Per HPI unless specifically indicated above     Objective:    BP 120/71   Pulse 83   Temp 98.2 F (36.8 C) (Oral)   Wt 217 lb 6.4 oz (98.6 kg)   LMP  (LMP Unknown)   SpO2 97%   BMI 32.10 kg/m   Wt Readings from Last 3 Encounters:  01/15/21 217 lb 6.4 oz (98.6 kg)  09/11/20 216 lb (98 kg)  08/07/20 218 lb (98.9 kg)    Physical Exam Vitals and nursing note reviewed.  Constitutional:      General: She is awake. She is not in acute distress.    Appearance: She is well-developed and well-groomed. She is obese. She is not ill-appearing or toxic-appearing.  HENT:     Head: Normocephalic.     Right Ear: Hearing and external ear normal. No drainage.     Left Ear: Hearing and external ear normal. No drainage.  Eyes:     General: Lids are normal.        Right eye: No discharge.        Left eye: No discharge.     Conjunctiva/sclera: Conjunctivae normal.  Neck:     Thyroid: No thyromegaly.     Vascular: No carotid bruit.  Cardiovascular:     Rate and Rhythm: Normal rate and regular rhythm.     Heart sounds: Normal heart sounds. No murmur heard. No friction rub.     Comments: Compression hose in place. Pulmonary:     Effort:  Pulmonary effort is normal. No accessory muscle usage or respiratory distress.     Breath sounds: Normal breath sounds. No wheezing or rhonchi.  Abdominal:     General: Bowel sounds are normal. There is no distension.     Palpations: Abdomen is soft.  Musculoskeletal:     Cervical back: Normal range of motion.     Right lower leg: 2+ Edema present.     Left lower leg: 2+ Edema present.  Lymphadenopathy:     Cervical: No cervical adenopathy.  Skin:    General: Skin is warm.     Capillary Refill: Capillary refill takes less than 2 seconds.     Comments: Pale scattered bruising to bilateral upper extremities.  Neurological:     Mental Status: She is alert and oriented to person, place, and time.  Psychiatric:        Attention and Perception: Attention normal.  Mood and Affect: Mood normal.        Speech: Speech normal.        Behavior: Behavior normal. Behavior is cooperative.        Thought Content: Thought content normal.    Diabetic Foot Exam - Simple   Simple Foot Form Visual Inspection See comments: Yes Sensation Testing Intact to touch and monofilament testing bilaterally: Yes Pulse Check Posterior Tibialis and Dorsalis pulse intact bilaterally: Yes Comments 2+ edema, baseline.    Results for orders placed or performed in visit on 01/15/21  Microalbumin, Urine Waived  Result Value Ref Range   Microalb, Ur Waived 10 0 - 19 mg/L   Creatinine, Urine Waived 100 10 - 300 mg/dL   Microalb/Creat Ratio <30 <30 mg/g      Assessment & Plan:   Problem List Items Addressed This Visit      Cardiovascular and Mediastinum   CAD (coronary artery disease)    Chronic, stable with no current symptoms.  Continue current medication regimen and collaboration with cardiology.      Aortic atherosclerosis (HCC)    Chronic, ongoing.  Noted on CT scan 04/19/2019.  Continue statin for prevention and Plavix.  Monitor closely.      Aneurysm of descending thoracic aorta (HCC)     Followed by vascular, last CT scan showed no significant increase in size.  Due for CT scan lung yearly.  Continue to collaborate with vascular.      Senile purpura (HCC)    On daily Plavix.  Recommend cleansing skin with gentle cleanser and use of daily lotion.  Monitor for skin breakdown and address if present.      Peripheral vascular disease due to secondary diabetes ([place])    Chronic, ongoing with lymphedema and PVD.  Continue to collaborate with vascular and current medication regimen.  A1c today.  Continue diet focus for diabetes control and once daily Metformin.        Respiratory   Centrilobular emphysema (HCC)    Chronic, ongoing.  Continue current medication regimen and adjust as needed.  May benefit from change to [facility] for once daily dosing and LAMA/LABA combo.  Obtain spirometry next visit. Continue to collaborate with CCM team.  Continue annual lung screening.  Return in 6 months.        Endocrine   Type 2 diabetes mellitus with obesity (HCC) - Primary    Chronic, ongoing with recent A1c 6.5%.  Continue Metformin 500 MG daily, which she is tolerating. Recommend she monitor BS daily at home.  Urine ALB (urine ALB 10 on check) and A1c today.  Return to office 6 months, sooner if medication changes are made.      Relevant Orders   Hemoglobin A1c   Microalbumin, Urine Waived (Completed)   Hyperlipidemia associated with type 2 diabetes mellitus (HCC)    Chronic, ongoing. Did not tolerate Rosuvastatin 20 MG three days a week.  Have tried multiple statins with ongoing myalgias with use.  Discussed with patient could consider PSK9 -- educated her on this.  Check lipid panel and CMP today.  CCM collaboration continues.      Relevant Orders   Lipid Panel w/o Chol/HDL Ratio     Musculoskeletal and Integument   Drug-induced myopathy    Did not tolerate Rosuvastatin 20 MG three days a week.  Have tried multiple statins with ongoing myalgias with use.  Discussed with patient could  consider PSK9 -- educated her on this.  Check lipid panel and  CMP today.  CCM collaboration continues.      Osteopenia of neck of left femur    Noted on DEXA 08/05/2017.  Will plan repeat 08/05/2022.  Continue Vitamin D supplement daily and adequate calcium intake.  Check Vit D today.        Genitourinary   Chronic kidney disease, stage 3 (HCC)    Chronic, stable with no decline on recent labs.  Renal dose medications as needed. CMP and urine ALB today -- urine ALB 10 on check.  For worsening would refer to nephrology.      Relevant Orders   Comprehensive metabolic panel   TSH     Other   Anxiety    Chronic, stable, only uses PRN.  Continue daily Citalopram and monitor.  Adjust dose or medication as needed, do not increase Celexa to 40 MG due to patient age >84.  Denies SI/HI.      Personal history of tobacco use, presenting hazards to health    Continue yearly lung CT screening.      Lymphedema    Chronic, ongoing.  Continue use of compression hose daily.  Continue collaboration with vascular team.  Appreciate their input.  Will discuss with them alternate locations for massage or OT therapy, as patient benefits from this and reports she was told she did not need to return to current location.  Return in 6 months or sooner if s/s infection.      Vitamin B12 deficiency    Chronic, stable.  Continue monthly injections, recheck B12 level today.      Relevant Orders   Vitamin B12   Vitamin D deficiency    Ongoing.  Recheck Vitamin D level today.  Recommend continue daily Vitamin D3 supplement, 1000 units.      Relevant Orders   VITAMIN D 25 Hydroxy (Vit-D Deficiency, Fractures)   Obesity    Recommended eating smaller high protein, low fat meals more frequently and exercising 30 mins a day 5 times a week with a goal of 10-15lb weight loss in the next 3 months. Patient voiced their understanding and motivation to adhere to these recommendations.        Other Visit Diagnoses     Colon cancer screening       Cologuard ordered.   Relevant Orders   Cologuard       Follow up plan: Return in about 6 months (around 07/17/2021) for Annual physical.

## 2021-01-15 NOTE — Assessment & Plan Note (Signed)
Ongoing.  Recheck Vitamin D level today.  Recommend continue daily Vitamin D3 supplement, 1000 units.

## 2021-01-16 LAB — COMPREHENSIVE METABOLIC PANEL
ALT: 20 IU/L (ref 0–32)
AST: 20 IU/L (ref 0–40)
Albumin/Globulin Ratio: 1.9 (ref 1.2–2.2)
Albumin: 4.4 g/dL (ref 3.7–4.7)
Alkaline Phosphatase: 74 IU/L (ref 44–121)
BUN/Creatinine Ratio: 15 (ref 12–28)
BUN: 14 mg/dL (ref 8–27)
Bilirubin Total: 0.3 mg/dL (ref 0.0–1.2)
CO2: 21 mmol/L (ref 20–29)
Calcium: 9.7 mg/dL (ref 8.7–10.3)
Chloride: 100 mmol/L (ref 96–106)
Creatinine, Ser: 0.92 mg/dL (ref 0.57–1.00)
Globulin, Total: 2.3 g/dL (ref 1.5–4.5)
Glucose: 84 mg/dL (ref 65–99)
Potassium: 5 mmol/L (ref 3.5–5.2)
Sodium: 143 mmol/L (ref 134–144)
Total Protein: 6.7 g/dL (ref 6.0–8.5)
eGFR: 65 mL/min/{1.73_m2} (ref >59)

## 2021-01-16 LAB — LIPID PANEL W/O CHOL/HDL RATIO
Cholesterol, Total: 223 mg/dL — ABNORMAL HIGH (ref 100–199)
HDL: 60 mg/dL (ref >39)
LDL Chol Calc (NIH): 137 mg/dL — ABNORMAL HIGH (ref 0–99)
Triglycerides: 147 mg/dL (ref 0–149)
VLDL Cholesterol Cal: 26 mg/dL (ref 5–40)

## 2021-01-16 LAB — VITAMIN D 25 HYDROXY (VIT D DEFICIENCY, FRACTURES): Vit D, 25-Hydroxy: 73.1 ng/mL (ref 30.0–100.0)

## 2021-01-16 LAB — HEMOGLOBIN A1C
Est. average glucose Bld gHb Est-mCnc: 137 mg/dL
Hgb A1c MFr Bld: 6.4 % — ABNORMAL HIGH (ref 4.8–5.6)

## 2021-01-16 LAB — TSH: TSH: 1.12 u[IU]/mL (ref 0.450–4.500)

## 2021-01-16 LAB — VITAMIN B12: Vitamin B-12: 888 pg/mL (ref 232–1245)

## 2021-01-16 NOTE — Progress Notes (Signed)
Contacted via [place] afternoon Dalya, your labs have returned: - Kidney and liver function are normal + electrolytes stable.   - Cholesterol levels are elevated without statin on board, we will monitor this.  Could trial Zetia, which is not a statin to see if any benefit and if not then could consider working with Almyra Free on how to obtain Repatha for you. - Thyroid, Vitamin D and Vitamin B12 are all normal. - A1c is improving with level 6.4% and glucose lower on labs.  Would continue Metformin as you are taking currently + diet focus.  Any questions? Keep being awesome!!  Thank you for allowing me to participate in your care. Kindest regards, Kemora Pinard

## 2021-01-18 NOTE — Progress Notes (Signed)
Carelink Summary Report / Loop Recorder 

## 2021-01-21 ENCOUNTER — Ambulatory Visit: Payer: Medicare Other

## 2021-01-29 DIAGNOSIS — Z1211 Encounter for screening for malignant neoplasm of colon: Secondary | ICD-10-CM | POA: Diagnosis not present

## 2021-01-29 DIAGNOSIS — Z1212 Encounter for screening for malignant neoplasm of rectum: Secondary | ICD-10-CM | POA: Diagnosis not present

## 2021-01-30 LAB — COLOGUARD: Cologuard: POSITIVE — AB

## 2021-02-03 LAB — COLOGUARD: COLOGUARD: POSITIVE — AB

## 2021-02-04 ENCOUNTER — Other Ambulatory Visit: Payer: Self-pay | Admitting: Nurse Practitioner

## 2021-02-04 MED ORDER — CHOLECALCIFEROL 1.25 MG (50000 UT) PO TABS: 1.0000 | ORAL_TABLET | ORAL | 4 refills | Status: DC

## 2021-02-05 ENCOUNTER — Telehealth: Payer: Self-pay

## 2021-02-05 DIAGNOSIS — R195 Other fecal abnormalities: Secondary | ICD-10-CM

## 2021-02-05 NOTE — Telephone Encounter (Signed)
Ok thank you. Just wanted to make sure you were aware of it.

## 2021-02-05 NOTE — Telephone Encounter (Signed)
I saw yesterday, plan on calling patient today to alert her as she is currently grieving loss of a friend.

## 2021-02-05 NOTE — Telephone Encounter (Signed)
We received a positive Cologuard result for patient.

## 2021-02-06 NOTE — Addendum Note (Signed)
Addended by: [name] T on: 02/06/2021 12:07 PM   Modules accepted: Orders

## 2021-02-06 NOTE — Telephone Encounter (Signed)
Spoke to patient on telephone alerted her to positive Cologuard results and need for GI referral for further evaluation.  Educated her on findings, she would like to see Dr. Allen Norris.  Order placed.

## 2021-02-07 ENCOUNTER — Ambulatory Visit: Payer: Medicare Other | Attending: Nurse Practitioner | Admitting: Occupational Therapy

## 2021-02-07 DIAGNOSIS — I890 Lymphedema, not elsewhere classified: Secondary | ICD-10-CM | POA: Insufficient documentation

## 2021-02-07 NOTE — Therapy (Signed)
Sharon [name] [facility] SERVICES [address] [place], [name], [address] Phone: [phone]   Fax:  [phone]  Occupational Therapy Treatment Note and Progress Report: BLE Lymphedema Care  Patient Details  Name: Sharon [name] MRN: [id number] Date of Birth: 01-Dec-1945 No data recorded  Encounter Date: 02/07/2021   OT End of Session - 02/07/21 1417    Visit Number 103    Number of Visits 75    Date for OT Re-Evaluation 05/08/21    OT Start Time 0214    OT Stop Time 0309    OT Time Calculation (min) 55 min    Activity Tolerance Patient tolerated treatment well;No increased pain    Behavior During Therapy WFL for tasks assessed/performed           Past Medical History:  Diagnosis Date  . Anxiety   . Arthritis    hands, upper back  . Asthma   . COPD (chronic obstructive pulmonary disease) ([place])   . History of cervical cancer   . Menopausal disorder   . Osteoporosis   . Pneumonia 1960  . Spasm of abdominal muscles of right side    intermittent  . TMJ (dislocation of temporomandibular joint)   . Wears dentures    partial lower    Past Surgical History:  Procedure Laterality Date  . ABDOMINAL HYSTERECTOMY  1970's  . bladder botox  2005  . BLADDER SUSPENSION  2004  . CATARACT EXTRACTION W/PHACO Right 03/09/2018   Procedure: CATARACT EXTRACTION PHACO AND INTRAOCULAR LENS PLACEMENT ([name]) right;  Surgeon: Eulogio Bear, MD;  Location: [place];  Service: Ophthalmology;  Laterality: Right;  CALL CELL 1ST  . CATARACT EXTRACTION W/PHACO Left 04/19/2018   Procedure: CATARACT EXTRACTION PHACO AND INTRAOCULAR LENS PLACEMENT (I[facility])  LEFT;  Surgeon: Eulogio Bear, MD;  Location: [place];  Service: Ophthalmology;  Laterality: Left;  . COLONOSCOPY WITH PROPOFOL N/A 12/13/2015   Procedure: COLONOSCOPY WITH PROPOFOL;  Surgeon: Lucilla Lame, MD;  Location: [place];  Service: Endoscopy;  Laterality: N/A;  .  LOOP RECORDER INSERTION N/A 01/07/2018   Procedure: LOOP RECORDER INSERTION;  Surgeon: Deboraha Sprang, MD;  Location: [facility];  Service: Cardiovascular;  Laterality: N/A;  . POLYPECTOMY N/A 12/13/2015   Procedure: POLYPECTOMY;  Surgeon: Lucilla Lame, MD;  Location: [facility];  Service: Endoscopy;  Laterality: N/A;  SIGMOID COLON POLYPS X  5  . SHOULDER ARTHROSCOPY W/ ROTATOR CUFF REPAIR Right 1998  . TEE WITHOUT CARDIOVERSION N/A 01/06/2018   Procedure: TRANSESOPHAGEAL ECHOCARDIOGRAM (TEE);  Surgeon: Minna Merritts, MD;  Location: [facility];  Service: Cardiovascular;  Laterality: N/A;  . TONSILLECTOMY AND ADENOIDECTOMY      There were no vitals filed for this visit.   Subjective Assessment - 02/07/21 1631    Subjective  Mrs Lippold returns today for scheduled follow up-OT visit 30 of 85 -to address BLE phlebo-lymphedema. Pt was last seen on 12/27/20. She reports she started taking a "fluid pill" recently and has noticed an improvement in her leg swelling. She reports she has been working hard to don stockins in the morning when she arises, and has increased time elevating legs when sitting. Pt continues to endorse leg pain, but does not rate it numerivcally today.    Pertinent History Hx anxiety, COPD, OA, Asthma, hx cervical ca, osteoporosis, hx pneumonia, hx CVA, hx LLE cellulitis, hx L leg wound, hx falls    Limitations chronic BLE leg swelling  and associated pain, decreased balance, fall risk, decreased standing, walking and sitting tolerance, difficulty w/ lower body bathing and dressing, difficulty fitting LB clothing and shoes, unable to drive, impaired transfers and functional mobility, impaired social participation, impaired ability to perform household chores, cooking, meal prep    Repetition Increases Symptoms    Special Tests 11/20 dopler negative for DVT and CVI               LYMPHEDEMA/ONCOLOGY QUESTIONNAIRE - 02/07/21 0001      Right Lower Extremity  Lymphedema   Other RLE limb volume from ankle to tibial tuberosity = 3192.3 ml.    Other RLE A-D limb volume is INcreased 1.8% since last measured on 12/27/20 .      Left Lower Extremity Lymphedema   Other LLE (Rx limb) A-D volume measures 13172.7 ml. LLE A-D limb volume is DEcreased by 1.3% since last measured on 12/27/20.    Other Limb volume differential (LVD) measures 0.62%, R (dominant) >L, which is WNL                   OT Treatments/Exercises (OP) - 02/07/21 0001      ADLs   ADL Education Given Yes      Manual Therapy   Manual Therapy Edema management    Edema Management BLE comparative limb volumetrics    Compression Bandaging Pt dons and doffs ccl 1 compression stockings with extra time (modified assistance)                  OT Education - 02/07/21 1637    Education Details Continued Pt/ CG edu for lymphedema self care  and home program throughout session. Topics include multilayer, gradient compression wrapping, simple self-MLD, therapeutic lymphatic pumping exercises, skin/nail care, risk reduction factors and LE precautions, compression garments/recommendations and wear and care schedule and compression garment donning / doffing using assistive devices. All questions answered to the Pt's satisfaction, and Pt demonstrates understanding by report.    Person(s) Educated Patient    Methods Explanation;Demonstration    Comprehension Verbalized understanding;Returned demonstration               OT Long Term Goals - 02/07/21 1621      OT LONG TERM GOAL #1   Title Pt will demonstrate understanding of lymphedema (LE) precautions / prevention principals, including signs / symptoms of cellulitis infection with modified independence using LE Workbook as printed reference to identify 6 precautions without verbal cues by end of 3rd  OT Rx visit.     Time 3    Period Days    Status Achieved      OT LONG TERM GOAL #2   Title Pt will be able todon and doff  existing compression garments independently and without assistive devices and/ or extra time for optimal lymphedema self management over time and to limit infection risk.    Baseline supervision    Time 4    Period Days    Status Achieved      OT LONG TERM GOAL #3   Title Pt to achieve at least  10% limb volume reduction in affected limb(s)  bilaterally during Intensive Phase CDT to control limb swelling, to improve tissue integrity and immune function, to improve ADLs performance and to improve functional mobility/ transfer, and to improve body image and self-esteem.    Baseline max A    Time 8    Period Weeks    Status Partially Met   RLE decreased  4.5% since 08/22/20. LLE stable with 0.9% increase snce 08/22/20.   Target Date 05/08/21      OT LONG TERM GOAL #4   Title Pt will achieve 100% compliance with daily LE self-care home program components, including daily  skin care, simple self-MLD, gradient compression wraps/ compression garments/devices, and therapeutic exercise with Mod CG support to ensure optimal Intensive Phase limb volume reduction to expedite compression garment/ device fitting.    Baseline 50% compliant    Time 12    Period Weeks    Status Achieved   met by report     OT LONG TERM GOAL #5   Title Pt will use appropriate  assistive devices during LE self-care training to don compression garments/devices with modified independence to ensure optimal LE self-management over time and to limit progression of chronic LE.    Baseline min A    Time 12    Period Weeks    Status Achieved   DC goal     OT LONG TERM GOAL #6   Title Pt will retain optimal limb volume reductions achieved during Intensive Phase CDT with no more than 3% volume increase with ongoing CG assistance to limit LE progression and further functional decline.    Baseline min A    Time 6    Period Months    Status Achieved   Met today with 1.3% reduction in LLE and 1.8% volume increase in R(dominant) LE  since last visit on 12/27/20.                Plan - 02/07/21 1624    Clinical Impression Statement Pt reports noticable decrease and improved stability in leg swelling since starting diuretic perscribed by dermatlogist during visit interval for lymphedema follow along. She does endorse ongoing pain in both legs and feet, which is likely due to some ongoing lymphatic inflammation combined with sensory symptoms 2/2 periferal venous disease. Comparative limb volumetrics reveal R (dominant) lower extremity volume from ankle to tibial tuberosity (A-D) is increased by 1.8%, and L leg volue is decreased by 1.3% since 12/27/20. Limb volume differential (LVD) measures 0.62%, R (dominant) >L, which is WNL.  These values meet long term goal of no retaining post Intensive Phase  limb volume stability with no more than a 3% upward volume increase q 3 months. Pt reports she wears compression stockings daily and is  diligent with skin care and leg elevation when sitting. She and OT agree that skilled therapy is not indicated at this time and a 3 month follow up during hot summer months is a good idea. Pt will call PRN.    Occupational performance deficits (Please refer to evaluation for details): ADL's;Leisure;IADL's;Social Participation;Rest and Sleep;Work           Patient will benefit from skilled therapeutic intervention in order to improve the following deficits and impairments:           Visit Diagnosis: Lymphedema, not elsewhere classified - Plan: Ot plan of care cert/re-cert    Problem List Patient Active Problem List   Diagnosis Date Noted  . Peripheral vascular disease due to secondary diabetes ([place]) 01/11/2021  . Osteopenia of neck of left femur 01/11/2021  . Drug-induced myopathy 09/13/2020  . Neuropathy of both feet 05/24/2020  . Hyperlipidemia associated with type 2 diabetes mellitus ([place]) 02/14/2020  . Obesity 02/14/2020  . Vitamin D deficiency 06/28/2019  . Type 2 diabetes  mellitus with obesity ([place]) 06/28/2019  . GERD (gastroesophageal reflux disease) 12/07/2018  .  Vitamin B12 deficiency 12/05/2018  . Senile purpura ([place]) 07/28/2018  . Lymphedema 07/14/2018  . Aneurysm of descending thoracic aorta (HCC) 05/04/2018  . Allergic rhinitis 02/16/2018  . Aortic atherosclerosis ([place]) 01/13/2018  . Chronic kidney disease, stage 3 ([place]) 01/12/2018  . History of CVA (cerebrovascular accident)   . TIA (transient ischemic attack) 01/04/2018  . CAD (coronary artery disease) 11/25/2017  . Caregiver stress 11/25/2017  . Menopause 08/14/2017  . Stress incontinence 06/19/2017  . Personal history of tobacco use, presenting hazards to health 04/01/2017  . Anxiety 03/13/2017  . Advanced care planning/counseling discussion 10/17/2016  . Benign neoplasm of sigmoid colon   . Centrilobular emphysema ([place]) 10/17/2015  . Mass of upper inner quadrant of right breast 06/22/2015    Andrey Spearman, MS, OTR/L, [facility] 02/07/21 4:44 PM   [name] SERVICES [address] [place], [name], [address] Phone: [phone]   Fax:  [phone]  Name: Sharon [name] MRN: [id number] Date of Birth: Dec 21, 1945

## 2021-02-07 NOTE — Patient Instructions (Signed)

## 2021-02-08 ENCOUNTER — Other Ambulatory Visit: Payer: Self-pay | Admitting: Nurse Practitioner

## 2021-02-08 NOTE — Telephone Encounter (Signed)
Approved per protocol.  Requested Prescriptions  Pending Prescriptions Disp Refills  . budesonide-formoterol (SYMBICORT) 160-4.5 MCG/ACT inhaler [Pharmacy Med Name: SYMBICORT 160/4.5MCG (120 ORAL INH)] 10.2 g 2    Sig: INHALE 2 PUFFS BY MOUTH TWICE DAILY     Pulmonology:  Combination Products Passed - 02/08/2021  6:22 AM      Passed - Valid encounter within last 12 months    Recent Outpatient Visits          3 weeks ago Type 2 diabetes mellitus with obesity ([place])   [place], [name], NP   6 months ago Encounter for annual physical exam   [place], Henrine Screws T, NP   7 months ago Wound of right lower extremity, subsequent encounter   [facility] Kathrine Haddock, NP   7 months ago Cellulitis of right lower extremity   [facility] Kathrine Haddock, NP   8 months ago Lymphedema   [facility] Kathrine Haddock, NP      Future Appointments            In 4 days Brendolyn Patty, MD [place]   In 2 months  [place], [place]   In 5 months [place], [name], NP [employer], [place]

## 2021-02-11 ENCOUNTER — Ambulatory Visit (INDEPENDENT_AMBULATORY_CARE_PROVIDER_SITE_OTHER): Payer: Medicare Other

## 2021-02-11 DIAGNOSIS — G459 Transient cerebral ischemic attack, unspecified: Secondary | ICD-10-CM | POA: Diagnosis not present

## 2021-02-12 ENCOUNTER — Other Ambulatory Visit: Payer: Self-pay

## 2021-02-12 ENCOUNTER — Ambulatory Visit (INDEPENDENT_AMBULATORY_CARE_PROVIDER_SITE_OTHER): Payer: Medicare Other | Admitting: Dermatology

## 2021-02-12 DIAGNOSIS — D692 Other nonthrombocytopenic purpura: Secondary | ICD-10-CM

## 2021-02-12 DIAGNOSIS — I872 Venous insufficiency (chronic) (peripheral): Secondary | ICD-10-CM

## 2021-02-12 DIAGNOSIS — Z8673 Personal history of transient ischemic attack (TIA), and cerebral infarction without residual deficits: Secondary | ICD-10-CM

## 2021-02-12 LAB — CUP PACEART REMOTE DEVICE CHECK
Date Time Interrogation Session: 20220430232210
Implantable Pulse Generator Implant Date: 20190328

## 2021-02-12 NOTE — Patient Instructions (Addendum)
Topical steroids (such as triamcinolone, fluocinolone, fluocinonide, mometasone, clobetasol, halobetasol, betamethasone, hydrocortisone) can cause thinning and lightening of the skin if they are used for too long in the same area. Your physician has selected the right strength medicine for your problem and area affected on the body. Please use your medication only as directed by your physician to prevent side effects.   Avoid applying to face, groin, and axilla. Use as directed. Risk of skin atrophy with long-term use reviewed.   Medi  juxtalite compression stockings found at [facility] in [place]  If you have any questions or concerns for your doctor, please call our main line at [phone] and press option 4 to reach your doctor's medical assistant. If no one answers, please leave a voicemail as directed and we will return your call as soon as possible. Messages left after 4 pm will be answered the following business day.   You may also send [place] a message via [place]. We typically respond to MyChart messages within 1-2 business days.  For prescription refills, please ask your pharmacy to contact our office. Our fax number is [phone].  If you have an urgent issue when the clinic is closed that cannot wait until the next business day, you can page your doctor at the number below.    Please note that while we do our best to be available for urgent issues outside of office hours, we are not available 24/7.   If you have an urgent issue and are unable to reach [place], you may choose to seek medical care at your doctor's office, retail clinic, urgent care center, or emergency room.  If you have a medical emergency, please immediately call 911 or go to the emergency department.  Pager Numbers  - Dr. Nehemiah Massed: [phone]  - Dr. Laurence Ferrari: [phone]  - Dr. Nicole Kindred: [phone]  In the event of inclement weather, please call our main line at [phone] for an update on the status  of any delays or closures.  Dermatology Medication Tips: Please keep the boxes that topical medications come in in order to help keep track of the instructions about where and how to use these. Pharmacies typically print the medication instructions only on the boxes and not directly on the medication tubes.   If your medication is too expensive, please contact our office at [phone] option 4 or send [place] a message through [place].   We are unable to tell what your co-pay for medications will be in advance as this is different depending on your insurance coverage. However, we may be able to find a substitute medication at lower cost or fill out paperwork to get insurance to cover a needed medication.   If a prior authorization is required to get your medication covered by your insurance company, please allow [place] 1-2 business days to complete this process.  Drug prices often vary depending on where the prescription is filled and some pharmacies may offer cheaper prices.  The website www.goodrx.com contains coupons for medications through different pharmacies. The prices here do not account for what the cost may be with help from insurance (it may be cheaper with your insurance), but the website can give you the price if you did not use any insurance.  - You can print the associated coupon and take it with your prescription to the pharmacy.  - You may also stop by our office during regular business hours and pick up a GoodRx coupon card.  - If you need your  prescription sent electronically to a different pharmacy, notify our office through [facility] or by phone at [phone] option 4.

## 2021-02-12 NOTE — Progress Notes (Signed)
   Follow-Up Visit   Subjective  Sharon [name] is a 75 y.o. female who presents for the following: Follow-up (Patient here today for follow up on venous stasis dermatitis on both lower extremities. She reports they are still red but cream seems to be helping. She also reports the swelling is subsided some. Patient would also like to discuss if there is any treatment for bruising in her arms. She is currently on blood thinners. ). Patient has had stroke in 2019   Patient is now taking diuretic prescribed pcp. Patient states she tried restarting physical therapy with Andrey Spearman 2 x / wk. Patient states she was turned down and told by Clarene Critchley she did not need therapy.  Patient is currently using mometasone cream for flares along with tacrolimus ointment as needed.   The following portions of the chart were reviewed this encounter and updated as appropriate:      Objective  Well appearing patient in no apparent distress; mood and affect are within normal limits.  A focused examination was performed including bilateral legs and bilateral arms . Relevant physical exam findings are noted in the Assessment and Plan.  Objective  Left Lower Leg - Anterior: Pitting edema 2 + of BL ankles with mild/mod erythema of skin, no scale  Assessment & Plan  Venous stasis dermatitis of both lower extremities Left Lower Leg - Anterior  Stasis in the legs causes chronic leg swelling, which may result in itchy or painful rashes, skin discoloration, skin texture changes, and sometimes ulceration.  Recommend daily compression hose/stockings- easiest to put on first thing in morning, remove at bedtime.  Elevate legs as much as possible. Avoid salt/sodium rich foods.   - continue lymphedema pump for 15 minutes a day  - continue compression stockings daily  Continue mometasone 0.1 % cream apply to lower legs twice daily prn flare until rash improved. Avoid applying to face, groin, and axilla. Use as directed.  Risk of skin atrophy with long-term use reviewed.   continue tacrolimus 0.1 % ointment - apply to lower legs 1 - 2 times a day until rash improved.   Recommend following up with pcp or heart provider to increase/change diuretic if possible to help decrease swelling   Topical steroids (such as triamcinolone, fluocinolone, fluocinonide, mometasone, clobetasol, halobetasol, betamethasone, hydrocortisone) can cause thinning and lightening of the skin if they are used for too long in the same area. Your physician has selected the right strength medicine for your problem and area affected on the body. Please use your medication only as directed by your physician to prevent side effects.   If swelling worsens, recommend restarting lymphedema PT   Other Related Medications mometasone (ELOCON) 0.1 % cream tacrolimus (PROTOPIC) 0.1 % ointment  Purpura - Chronic; persistent and recurrent.  Treatable, but not curable. - Violaceous macules and patches on bilateral arms  - Benign - Related to trauma, age, sun damage and/or use of blood thinners, chronic use of topical and/or oral steroids - Observe - Can use OTC arnica containing moisturizer such as Dermend Bruise Formula if desired - Call for worsening or other concerns   Return in about 3 months (around 05/15/2021) for venous stasis dermatitis follow up.  I, Ruthell Rummage, CMA, am acting as scribe for Brendolyn Patty, MD.  Documentation: I have reviewed the above documentation for accuracy and completeness, and I agree with the above.  Brendolyn Patty MD

## 2021-02-18 ENCOUNTER — Other Ambulatory Visit: Payer: Self-pay | Admitting: Nurse Practitioner

## 2021-02-18 ENCOUNTER — Ambulatory Visit (INDEPENDENT_AMBULATORY_CARE_PROVIDER_SITE_OTHER): Payer: Medicare Other

## 2021-02-18 ENCOUNTER — Other Ambulatory Visit: Payer: Self-pay

## 2021-02-18 DIAGNOSIS — E538 Deficiency of other specified B group vitamins: Secondary | ICD-10-CM | POA: Diagnosis not present

## 2021-02-18 MED ORDER — CYANOCOBALAMIN 1000 MCG/ML IJ SOLN: 1000.0000 ug | INTRAMUSCULAR | Status: DC

## 2021-02-18 MED ORDER — CYANOCOBALAMIN 1000 MCG/ML IJ SOLN
1000.0000 ug | Freq: Once | INTRAMUSCULAR | Status: AC
  Administered 2021-02-18: 1000 ug via INTRAMUSCULAR

## 2021-02-19 ENCOUNTER — Telehealth: Payer: Self-pay

## 2021-02-19 ENCOUNTER — Telehealth: Payer: Self-pay | Admitting: Cardiovascular Disease

## 2021-02-19 ENCOUNTER — Other Ambulatory Visit: Payer: Self-pay

## 2021-02-19 DIAGNOSIS — R195 Other fecal abnormalities: Secondary | ICD-10-CM

## 2021-02-19 MED ORDER — NA SULFATE-K SULFATE-MG SULF 17.5-3.13-1.6 GM/177ML PO SOLN: 354.0000 mL | Freq: Once | ORAL | 0 refills | Status: AC

## 2021-02-19 NOTE — Telephone Encounter (Signed)
   [facility] Pre-operative Risk Assessment    Patient Name: Sharon [name]  DOB: 16-Feb-1946  MRN: [id number]   HEARTCARE STAFF: - Please ensure there is not already an duplicate clearance open for this procedure. - Under Visit Info/Reason for Call, type in Other and utilize the format Clearance MM/DD/YY or Clearance TBD. Do not use dashes or single digits. - If request is for dental extraction, please clarify the # of teeth to be extracted.  Request for surgical clearance:  1. What type of surgery is being performed? Colonoscopy   2. When is this surgery scheduled? 03/14/21  3. What type of clearance is required (medical clearance vs. Pharmacy clearance to hold med vs. Both)? both  4. Are there any medications that need to be held prior to surgery and how long? Plavix 75 MG  5. Practice name and name of physician performing surgery? [facility]   6. What is the office phone number? [phone]   7.   What is the office fax number? [phone]  8.   Anesthesia type (None, local, MAC, general) ? Not listed    Ace Gins 02/19/2021, 4:53 PM  _________________________________________________________________   (provider comments below)

## 2021-02-19 NOTE — Telephone Encounter (Signed)
Gastroenterology Pre-Procedure Review  Request Date: 03/14/2021 Requesting Physician: Dr. Allen Norris  PATIENT REVIEW QUESTIONS: The patient responded to the following health history questions as indicated:    1. Are you having any GI issues? no 2. Do you have a personal history of Polyps? no 3. Do you have a family history of Colon Cancer or Polyps? no 4. Diabetes Mellitus? no 5. Joint replacements in the past 12 months?no 6. Major health problems in the past 3 months? Aneurysm in stomach in January  7. Any artificial heart valves, MVP, or defibrillator?no    MEDICATIONS & ALLERGIES:    Patient reports the following regarding taking any anticoagulation/antiplatelet therapy:   Plavix, Coumadin, Eliquis, Xarelto, Lovenox, Pradaxa, Brilinta, or Effient? Plavix PCP office  Aspirin? no  Patient confirms/reports the following medications:  Current Outpatient Medications  Medication Sig Dispense Refill  . acetaminophen (TYLENOL) 500 MG tablet Take 500 mg by mouth every 6 (six) hours as needed.    . Blood Glucose Monitoring Suppl (ONETOUCH VERIO) w/Device KIT Utilize to check blood sugar twice a day, fasting in morning with goal < 130 and then 2 hours after a meal with goal <180.  Document and bring to visits. 1 kit 0  . budesonide-formoterol (SYMBICORT) 160-4.5 MCG/ACT inhaler INHALE 2 PUFFS BY MOUTH TWICE DAILY 10.2 g 2  . Cholecalciferol 1.25 MG (50000 UT) TABS Take 1 tablet by mouth once a week. 14 tablet 4  . citalopram (CELEXA) 20 MG tablet Take 1 tablet (20 mg total) by mouth daily. 90 tablet 3  . clopidogrel (PLAVIX) 75 MG tablet TAKE 1 TABLET(75 MG) BY MOUTH DAILY 90 tablet 4  . Cyanocobalamin 1000 MCG/ML KIT Inject 1,000 mcg as directed every 30 (thirty) days.    . furosemide (LASIX) 20 MG tablet Take 0.5 tablets (10 mg total) by mouth daily. 30 tablet 3  . glucose blood (ONETOUCH VERIO) test strip Utilize to check blood sugar twice a day, fasting in morning with goal < 130 and then 2  hours after a meal with goal <180.  Document and bring to visits. 100 each 12  . Lancets (ONETOUCH ULTRASOFT) lancets Utilize to check blood sugar twice a day, fasting in morning with goal < 130 and then 2 hours after a meal with goal <180.  Document and bring to visits. 100 each 12  . metFORMIN (GLUCOPHAGE) 500 MG tablet Take 1 tablet (500 mg total) by mouth daily with breakfast. 90 tablet 3  . mometasone (ELOCON) 0.1 % cream Apply to lower legs twice daily until rash improved. Avoid face, groin, underarms. 50 g 1  . Multiple Vitamin (MULTIVITAMIN) tablet Take 1 tablet by mouth daily.    . pantoprazole (PROTONIX) 40 MG tablet TAKE 1 TABLET(40 MG) BY MOUTH DAILY 90 tablet 4  . penicillin v potassium (VEETID) 250 MG tablet TAKE 1 TABLET(250 MG) BY MOUTH TWICE DAILY 60 tablet 6  . PROAIR HFA 108 (90 Base) MCG/ACT inhaler INHALE 2 PUFFS INTO THE LUNGS EVERY 6 HOURS AS NEEDED FOR WHEEZING OR SHORTNESS OF BREATH 8.5 g 2  . tacrolimus (PROTOPIC) 0.1 % ointment Apply to lower legs 1-2 times a day until rash improved. 100 g 2  . tacrolimus (PROTOPIC) 0.1 % ointment Apply topically 2 (two) times daily. 100 g 0   Current Facility-Administered Medications  Medication Dose Route Frequency Provider Last Rate Last Admin  . [START ON 03/18/2021] cyanocobalamin ((VITAMIN B-12)) injection 1,000 mcg  1,000 mcg Intramuscular Q30 days [name], NP  Patient confirms/reports the following allergies:  Allergies  Allergen Reactions  . Statins Other (See Comments)    myalgia  . Baclofen Swelling  . Bactrim [Sulfamethoxazole-Trimethoprim] Nausea And Vomiting  . Ibuprofen Rash    Mouth swelling  . Latex Rash    Some bandaids, some gloves; BLOOD TEST NEGATIVE  . Librium [Chlordiazepoxide] Itching    Dizziness   . Naprosyn [Naproxen] Rash    Mouth swelling  . Other Rash    [place] nuts - mouth swelling    No orders of the defined types were placed in this encounter.   AUTHORIZATION  INFORMATION Primary Insurance: 1D#: Group #:  Secondary Insurance: 1D#: Group #:  SCHEDULE INFORMATION: Date:  Time: Location:

## 2021-02-19 NOTE — Telephone Encounter (Signed)
Duplicate. Please see other clearance form that was scanned today.  Darreld Mclean, PA-C 02/19/2021 5:23 PM

## 2021-02-19 NOTE — Telephone Encounter (Signed)
   [facility] Pre-operative Risk Assessment    Request for surgical clearance:  1. What type of surgery is being performed? Colonoscopy    2. When is this surgery scheduled? 03/14/2021  3. Are there any medications that need to be held prior to surgery and how long? Plavix 33m   4. [facility]. [facility] Norris  5. What is your office phone and fax number? [phone]  6. Anesthesia type (None, local, MAC, general) ? [place]5/07/2021, 4:34 PM  _________________________________________________________________   (provider comments below)

## 2021-02-20 ENCOUNTER — Ambulatory Visit: Payer: Medicare Other

## 2021-02-20 NOTE — Telephone Encounter (Signed)
Sharon [name] this is her PCP -- we can stop for 7 days pre procedure and restart Plavix 2 days after is no issues during procedure.  Her history is a length of time ago and should be stable to hold.  Will monitor closely.  Thanks.

## 2021-02-20 NOTE — Telephone Encounter (Signed)
Per Christell Faith, PAC I will forward clearance to PCP to address clearance for Plavix. See notes. Clearance notes have been faxed to [name], NP.

## 2021-02-20 NOTE — Telephone Encounter (Signed)
Pt is agreeable to pre op appt. Pt scheduled to see Christell Faith, [facility] 02/22/21 @ 2 pm. Will forward clearance notes to [facility] for upcoming appt. Will send FYI to requesting office pt has appt 02/22/21.

## 2021-02-20 NOTE — Telephone Encounter (Signed)
   Name: Sharon [name]  DOB: 25-Aug-1946  MRN: [id number]  Primary Cardiologist: Ida Rogue, MD  Chart reviewed as part of pre-operative protocol coverage. Because of [name]'s past medical history and time since last visit, she will require a follow-up visit in order to better assess preoperative cardiovascular risk.  Pre-op covering staff: - Please schedule appointment and call patient to inform them. Please add "pre-op clearance" to the appointment notes so provider is aware. - Please contact requesting surgeon's office via preferred method (i.e, phone, fax) to inform them of need for appointment prior to surgery.   Per chart review, it appears patient is on plavix due to history of CVA. Will defer to PCP [name], NP) for recommendations on holding plavix prior to her upcoming colonoscopy.    Abigail Butts, PA-C  02/20/2021, 12:44 PM

## 2021-02-21 ENCOUNTER — Telehealth: Payer: Self-pay

## 2021-02-21 NOTE — Telephone Encounter (Signed)
Patient verbalized understanding of instructions  

## 2021-02-21 NOTE — Telephone Encounter (Addendum)
PCP office wants patient to stop the Plavix 7 days prior to procedure and restart it 2 days after the procedure

## 2021-02-21 NOTE — Progress Notes (Signed)
Will cc anticoag clinic for assistance

## 2021-02-21 NOTE — Progress Notes (Signed)
Cardiology Office Note    Date:  02/22/2021   ID:  Sharon [name], Sharon [name] July 28, 1946, MRN [id number]  PCP:  [name], NP  Cardiologist:  Ida Rogue, MD  Electrophysiologist:  Virl Axe, MD   Chief Complaint: Preprocedure cardiac risk stratification  History of Present Illness:   Sharon [name] is a 75 y.o. female with history of embolic CVA in 01/3328, coronary artery calcification, aortic atherosclerosis with stable dilatation of the descending thoracic aorta measuring up to 4 cm in 07/2020, AAA measuring 3.3 cm in 08/2020 followed by vascular surgery, COPD secondary to to prior tobacco use quitting in 2017 with a 33-pack-year history, lymphedema, cervical cancer, asthma, and anxiety who presents for preprocedure cardiac risk stratification.  She was admitted to the hospital in 12/2017 with slurred speech and weakness involving the left hand with associated facial droop.  She was found to have multiple right cerebral infarcts concerning for embolic phenomena.  Carotid artery ultrasound showed minor carotid artery atherosclerosis with no hemodynamically significant disease.  Surface echo showed an EF of 70% with no regional wall motion abnormalities.  TEE showed an EF of 55 to 65%, no regional wall motion abnormalities, no left atrial appendage thrombus, and no cardiac source of emboli identified with a normal sized aortic root, ascending thoracic aorta, aortic arch, and descending thoracic aorta.  She subsequently underwent ILR implantation with no evidence of A. fib present on all device transmissions up to and including most recent transmission on 02/12/2021.  Most recent echo from 09/2018 showed an EF of 55 to 60%, no regional wall motion abnormalities, grade 1 diastolic dysfunction, upper limit of normal PASP, no significant valvular abnormalities, and a small pericardial effusion identified posterior to the heart.  She was last seen in the office in 12/2019 and was doing reasonably well  from a cardiac perspective.  She is scheduled to undergo a colonoscopy on 03/14/2021 for abnormal Cologuard screening.  She comes in doing well from a cardiac perspective.  She does note chronic stable dyspnea which is attributed to underlying COPD, obesity, and physical deconditioning with functional status limitation secondary to underlying hip and knee pain.  She remains very active at home and does all of her gardening, including a labor-intensive activities.  No chest pain, palpitations, dizziness, presyncope, or syncope.  She does note easy bruising on clopidogrel.  Revised Cardiac Risk Index: Low risk for noncardiac procedure Duke Activity Status Index: >4 METs without cardiac limitation   Labs independently reviewed: 01/2021 - A1c 6.4, TSH normal, TC 223, TG 147, HDL 60, LDL 137, BUN 14, serum creatinine 0.92, potassium 5.0, albumin 4.4, AST/ALT normal 07/2020 - Hgb 14.4, PLT 1  Past Medical History:  Diagnosis Date  . Anxiety   . Arthritis    hands, upper back  . Asthma   . COPD (chronic obstructive pulmonary disease) ([place])   . History of cervical cancer   . Menopausal disorder   . Osteoporosis   . Pneumonia 1960  . Spasm of abdominal muscles of right side    intermittent  . TMJ (dislocation of temporomandibular joint)   . Wears dentures    partial lower    Past Surgical History:  Procedure Laterality Date  . ABDOMINAL HYSTERECTOMY  1970's  . bladder botox  2005  . BLADDER SUSPENSION  2004  . CATARACT EXTRACTION W/PHACO Right 03/09/2018   Procedure: CATARACT EXTRACTION PHACO AND INTRAOCULAR LENS PLACEMENT ([place]) right;  Surgeon: Eulogio Bear, MD;  Location: [facility];  Service: Ophthalmology;  Laterality: Right;  CALL CELL 1ST  . CATARACT EXTRACTION W/PHACO Left 04/19/2018   Procedure: CATARACT EXTRACTION PHACO AND INTRAOCULAR LENS PLACEMENT ([facility])  LEFT;  Surgeon: Eulogio Bear, MD;  Location: [facility];  Service: Ophthalmology;  Laterality:  Left;  . COLONOSCOPY WITH PROPOFOL N/A 12/13/2015   Procedure: COLONOSCOPY WITH PROPOFOL;  Surgeon: Lucilla Lame, MD;  Location: [facility];  Service: Endoscopy;  Laterality: N/A;  . LOOP RECORDER INSERTION N/A 01/07/2018   Procedure: LOOP RECORDER INSERTION;  Surgeon: Deboraha Sprang, MD;  Location: [facility];  Service: Cardiovascular;  Laterality: N/A;  . POLYPECTOMY N/A 12/13/2015   Procedure: POLYPECTOMY;  Surgeon: Lucilla Lame, MD;  Location: [facility];  Service: Endoscopy;  Laterality: N/A;  SIGMOID COLON POLYPS X  5  . SHOULDER ARTHROSCOPY W/ ROTATOR CUFF REPAIR Right 1998  . TEE WITHOUT CARDIOVERSION N/A 01/06/2018   Procedure: TRANSESOPHAGEAL ECHOCARDIOGRAM (TEE);  Surgeon: Minna Merritts, MD;  Location: [facility];  Service: Cardiovascular;  Laterality: N/A;  . TONSILLECTOMY AND ADENOIDECTOMY      Current Medications: Current Meds  Medication Sig  . acetaminophen (TYLENOL) 500 MG tablet Take 500 mg by mouth every 6 (six) hours as needed.  . Blood Glucose Monitoring Suppl (ONETOUCH VERIO) w/Device KIT Utilize to check blood sugar twice a day, fasting in morning with goal < 130 and then 2 hours after a meal with goal <180.  Document and bring to visits.  . budesonide-formoterol (SYMBICORT) 160-4.5 MCG/ACT inhaler INHALE 2 PUFFS BY MOUTH TWICE DAILY  . Cholecalciferol 1.25 MG (50000 UT) TABS Take 1 tablet by mouth once a week.  . citalopram (CELEXA) 20 MG tablet Take 20 mg by mouth as needed.  . clopidogrel (PLAVIX) 75 MG tablet TAKE 1 TABLET(75 MG) BY MOUTH DAILY  . Cyanocobalamin 1000 MCG/ML KIT Inject 1,000 mcg as directed every 30 (thirty) days.  . furosemide (LASIX) 20 MG tablet Take 0.5 tablets (10 mg total) by mouth daily.  Marland Kitchen glucose blood (ONETOUCH VERIO) test strip Utilize to check blood sugar twice a day, fasting in morning with goal < 130 and then 2 hours after a meal with goal <180.  Document and bring to visits.  . Lancets (ONETOUCH ULTRASOFT)  lancets Utilize to check blood sugar twice a day, fasting in morning with goal < 130 and then 2 hours after a meal with goal <180.  Document and bring to visits.  . metFORMIN (GLUCOPHAGE) 500 MG tablet Take 1 tablet (500 mg total) by mouth daily with breakfast.  . mometasone (ELOCON) 0.1 % cream Apply to lower legs twice daily until rash improved. Avoid face, groin, underarms.  . pantoprazole (PROTONIX) 40 MG tablet TAKE 1 TABLET(40 MG) BY MOUTH DAILY  . penicillin v potassium (VEETID) 250 MG tablet TAKE 1 TABLET(250 MG) BY MOUTH TWICE DAILY  . PROAIR HFA 108 (90 Base) MCG/ACT inhaler INHALE 2 PUFFS INTO THE LUNGS EVERY 6 HOURS AS NEEDED FOR WHEEZING OR SHORTNESS OF BREATH  . tacrolimus (PROTOPIC) 0.1 % ointment Apply to lower legs 1-2 times a day until rash improved.    Allergies:   Statins, Baclofen, Bactrim [sulfamethoxazole-trimethoprim], Ibuprofen, Latex, Librium [chlordiazepoxide], Naprosyn [naproxen], and Other   Social History   Socioeconomic History  . Marital status: Married    Spouse name: Shanai Lartigue  . Number of children: 1  . Years of education: Not on file  . Highest education level: Some college, no degree  Occupational History  .  Occupation: retired  Tobacco Use  . Smoking status: Former Smoker    Years: 56.00    Types: Cigarettes    Quit date: 07/14/2016    Years since quitting: 4.6  . Smokeless tobacco: Never Used  Vaping Use  . Vaping Use: Former  Substance and Sexual Activity  . Alcohol use: Yes    Alcohol/week: 0.0 standard drinks    Comment: occassional  . Drug use: No  . Sexual activity: Not Currently    Birth control/protection: Post-menopausal  Other Topics Concern  . 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 Difficulty of Paying Living Expenses: Not hard at all  Food Insecurity: No Food Insecurity  . Worried About Charity fundraiser in the  Last Year: Never true  . Ran Out of Food in the Last Year: Never true  Transportation Needs: No Transportation Needs  . Lack of Transportation (Medical): No  . Lack of Transportation (Non-Medical): No  Physical Activity: Inactive  . Days of Exercise per Week: 0 days  . Minutes of Exercise per Session: 0 min  Stress: No Stress Concern Present  . Feeling of Stress : Not at all  Social Connections: Not on file     Family History:  The patient's family history includes Diabetes in her mother; Heart disease in her mother; Stroke in her maternal grandmother and mother.  ROS:   Review of Systems  Constitutional: Negative for chills, diaphoresis, fever, malaise/fatigue and weight loss.  HENT: Negative for congestion.   Eyes: Negative for discharge and redness.  Respiratory: Positive for shortness of breath. Negative for cough, sputum production and wheezing.        Stable shortness of breath  Cardiovascular: Negative for chest pain, palpitations, orthopnea, claudication, leg swelling and PND.  Gastrointestinal: Negative for abdominal pain, blood in stool, heartburn, melena, nausea and vomiting.  Musculoskeletal: Positive for joint pain. Negative for back pain, falls and myalgias.  Skin: Negative for rash.  Neurological: Negative for dizziness, tingling, tremors, sensory change, speech change, focal weakness, loss of consciousness and weakness.  Endo/Heme/Allergies: Bruises/bleeds easily.  Psychiatric/Behavioral: Negative for substance abuse. The patient is not nervous/anxious.   All other systems reviewed and are negative.    EKGs/Labs/Other Studies Reviewed:    Studies reviewed were summarized above. The additional studies were reviewed today:  2D echo 09/2018: - Left ventricle: The cavity size was normal. There was mild  concentric hypertrophy. Systolic function was normal. The  estimated ejection fraction was in the range of 55% to 60%. Wall  motion was normal; there were  no regional wall motion  abnormalities. Doppler parameters are consistent with abnormal  left ventricular relaxation (grade 1 diastolic dysfunction).  - Pulmonary arteries: Systolic pressure was at the upper limits of  normal.  - Pericardium, extracardiac: A small pericardial effusion was  identified posterior to the heart. __________  TEE 12/2017: - Left ventricle: Systolic function was normal. The estimated  ejection fraction was in the range of 55% to 65%. Wall motion was  normal; there were no regional wall motion abnormalities.  - Left atrium: No evidence of thrombus in the atrial cavity or  appendage. No evidence of thrombus in the atrial cavity or  appendage.  - Right ventricle: Systolic function was normal.  - Right atrium: No evidence of thrombus in the atrial cavity or  appendage.  - Atrial septum:  No defect or patent foramen ovale was identified.  Echo contrast study showed no right-to-left atrial level shunt,  at baseline or with provocation.   Impressions:   - No cardiac source of emboli was indentified.  __________  2D echo 12/2017: - Procedure narrative: Transthoracic echocardiography. Image  quality was suboptimal. The study was technically difficult.  - Left ventricle: The cavity size was normal. Systolic function was  vigorous. The estimated ejection fraction was 70%. Wall motion  was normal; there were no regional wall motion abnormalities.  - Aortic valve: Valve area (Vmax): 2.92 cm^2.   Impressions:   - No cardiac source of emboli was indentified.   EKG:  EKG is ordered today.  The EKG ordered today demonstrates NSR, 86 bpm, no acute ST-T changes  Recent Labs: 07/17/2020: Hemoglobin 14.4; Platelets 201 01/15/2021: ALT 20; BUN 14; Creatinine, Ser 0.92; Potassium 5.0; Sodium 143; TSH 1.120  Recent Lipid Panel    Component Value Date/Time   CHOL 223 (H) 01/15/2021 1429   TRIG 147 01/15/2021 1429   HDL 60 01/15/2021 1429    CHOLHDL 2.9 01/08/2018 0406   VLDL 9 01/08/2018 0406   LDLCALC 137 (H) 01/15/2021 1429    PHYSICAL EXAM:    VS:  BP 130/70 (BP Location: Right Arm, Patient Position: Sitting, Cuff Size: Normal)   Pulse 86   Ht 5' 8.75" (1.746 m)   Wt 215 lb (97.5 kg)   LMP  (LMP Unknown)   SpO2 97%   BMI 31.98 kg/m   BMI: Body mass index is 31.98 kg/m.  Physical Exam Vitals reviewed.  Constitutional:      Appearance: She is well-developed.  HENT:     Head: Normocephalic and atraumatic.  Eyes:     General:        Right eye: No discharge.        Left eye: No discharge.  Neck:     Vascular: No JVD.  Cardiovascular:     Rate and Rhythm: Normal rate and regular rhythm.     Pulses: No midsystolic click and no opening snap.          Posterior tibial pulses are 2+ on the right side and 2+ on the left side.     Heart sounds: Normal heart sounds, S1 normal and S2 normal. Heart sounds not distant. No murmur heard. No friction rub.  Pulmonary:     Effort: Pulmonary effort is normal. No respiratory distress.     Breath sounds: Normal breath sounds. No decreased breath sounds, wheezing or rales.  Chest:     Chest wall: No tenderness.  Abdominal:     General: There is no distension.     Palpations: Abdomen is soft.     Tenderness: There is no abdominal tenderness.  Musculoskeletal:     Cervical back: Normal range of motion.  Skin:    General: Skin is warm and dry.     Nails: There is no clubbing.  Neurological:     Mental Status: She is alert and oriented to person, place, and time.  Psychiatric:        Speech: Speech normal.        Behavior: Behavior normal.        Thought Content: Thought content normal.        Judgment: Judgment normal.     Wt Readings from Last 3 Encounters:  02/22/21 215 lb (97.5 kg)  01/15/21 217 lb 6.4 oz (98.6 kg)  09/11/20 216 lb (98 kg)  ASSESSMENT & PLAN:   1. Preprocedure cardiac risk stratification: Scheduled to undergo a colonoscopy in the setting  of abnormal Cologuard screening.  Per Revised Cardiac Risk Index she is low risk for noncardiac procedure with a 0.9% estimated rate of MI, pulmonary edema, VF, cardiac arrest, or complete heart block.  Per Duke Activity Status Index she is able to achieve >4 METs.  She may proceed with noncardiac procedure at an overall low risk without further cardiac testing.  2. History of CVA: No new deficits.  Patient's PCP has indicated the patient can hold Plavix for 7 days prior to planned GI procedure.  They also recommend that this be resumed 2 days after procedure if no issues during the procedure.  3. Coronary artery calcification/aortic atherosclerosis/HLD: LDL 137 in 01/2021 with goal being less than 70.  She has has documented intolerance to statins including Lipitor, Crestor, and simvastatin.  We will refer her to the lipid clinic (she prefers a virtual visit) for consideration of PCSK9i.  4. Dilated ascending thoracic aorta: Stable on most recent CT of the chest in 07/2020.  This can be monitored annually with previously recommended lung cancer screening CT.  Continued optimal BP control is recommended.  5. AAA: Noted on ultrasound in 08/2020 measuring 3.3 cm.  Followed by vascular surgery.  Precautions discussed.  Optimal BP control recommended.  If at all possible, fluoroquinolones should be avoided.  6. Lymphedema: Wearing compression stockings.  Followed by vascular surgery.  Disposition: F/u with Dr. Rockey Situ or an APP in 6 months, and EP as directed.   Medication Adjustments/Labs and Tests Ordered: Current medicines are reviewed at length with the patient today.  Concerns regarding medicines are outlined above. Medication changes, Labs and Tests ordered today are summarized above and listed in the Patient Instructions accessible in Encounters.   Signed, Christell Faith, PA-C 02/22/2021 2:46 PM     [place], [place] [address] [phone]

## 2021-02-22 ENCOUNTER — Other Ambulatory Visit: Payer: Self-pay

## 2021-02-22 ENCOUNTER — Encounter: Payer: Self-pay | Admitting: Physician Assistant

## 2021-02-22 ENCOUNTER — Ambulatory Visit (INDEPENDENT_AMBULATORY_CARE_PROVIDER_SITE_OTHER): Payer: Medicare Other | Admitting: Physician Assistant

## 2021-02-22 VITALS — BP 130/70 | HR 86 | Ht 68.75 in | Wt 215.0 lb

## 2021-02-22 DIAGNOSIS — I714 Abdominal aortic aneurysm, without rupture, unspecified: Secondary | ICD-10-CM

## 2021-02-22 DIAGNOSIS — Z01810 Encounter for preprocedural cardiovascular examination: Secondary | ICD-10-CM

## 2021-02-22 DIAGNOSIS — E785 Hyperlipidemia, unspecified: Secondary | ICD-10-CM | POA: Diagnosis not present

## 2021-02-22 DIAGNOSIS — I890 Lymphedema, not elsewhere classified: Secondary | ICD-10-CM | POA: Diagnosis not present

## 2021-02-22 DIAGNOSIS — I2510 Atherosclerotic heart disease of native coronary artery without angina pectoris: Secondary | ICD-10-CM

## 2021-02-22 DIAGNOSIS — Z8673 Personal history of transient ischemic attack (TIA), and cerebral infarction without residual deficits: Secondary | ICD-10-CM

## 2021-02-22 DIAGNOSIS — I700 Atherosclerosis of aorta: Secondary | ICD-10-CM

## 2021-02-22 DIAGNOSIS — I77819 Aortic ectasia, unspecified site: Secondary | ICD-10-CM

## 2021-02-22 DIAGNOSIS — I2584 Coronary atherosclerosis due to calcified coronary lesion: Secondary | ICD-10-CM

## 2021-02-22 NOTE — Patient Instructions (Addendum)
Medication Instructions:  Your physician has recommended you make the following change in your medication:   1. HOLD plavix 7 days with LAST dose being on 03/06/21 RESUME per GI recommendations. 2. AVOID grapefruit due to contraindications with current medications.  *If you need a refill on your cardiac medications before your next appointment, please call your pharmacy*   Lab Work: None  If you have labs (blood work) drawn today and your tests are completely normal, you will receive your results only by: Marland Kitchen MyChart Message (if you have MyChart) OR . A paper copy in the mail If you have any lab test that is abnormal or we need to change your treatment, we will call you to review the results.   Testing/Procedures: None   Follow-Up: At [facility], you and your health needs are our priority.  As part of our continuing mission to provide you with exceptional heart care, we have created designated Provider Care Teams.  These Care Teams include your primary Cardiologist (physician) and Advanced Practice Providers (APPs -  Physician Assistants and Nurse Practitioners) who all work together to provide you with the care you need, when you need it.   Your next appointment:   6 month(s)  The format for your next appointment:   In Person  Provider:   You may see Ida Rogue, MD or one of the following Advanced Practice Providers on your designated Care Team:    Murray Hodgkins, NP  Christell Faith, PA-C  Marrianne Mood, PA-C  Cadence Kathlen Mody, [place]  Laurann Montana, NP    Other Instructions Please schedule virtual visit with the lipid clinic for PCSK9 treatment

## 2021-02-25 ENCOUNTER — Other Ambulatory Visit (INDEPENDENT_AMBULATORY_CARE_PROVIDER_SITE_OTHER): Payer: Self-pay | Admitting: Nurse Practitioner

## 2021-02-25 DIAGNOSIS — I714 Abdominal aortic aneurysm, without rupture, unspecified: Secondary | ICD-10-CM

## 2021-02-26 ENCOUNTER — Ambulatory Visit (INDEPENDENT_AMBULATORY_CARE_PROVIDER_SITE_OTHER): Payer: Medicare Other | Admitting: Nurse Practitioner

## 2021-02-26 ENCOUNTER — Encounter (INDEPENDENT_AMBULATORY_CARE_PROVIDER_SITE_OTHER): Payer: Self-pay | Admitting: Nurse Practitioner

## 2021-02-26 ENCOUNTER — Ambulatory Visit (INDEPENDENT_AMBULATORY_CARE_PROVIDER_SITE_OTHER): Payer: Medicare Other

## 2021-02-26 ENCOUNTER — Other Ambulatory Visit: Payer: Self-pay

## 2021-02-26 VITALS — BP 134/76 | HR 79 | Resp 16 | Wt 214.8 lb

## 2021-02-26 DIAGNOSIS — I714 Abdominal aortic aneurysm, without rupture, unspecified: Secondary | ICD-10-CM

## 2021-02-26 DIAGNOSIS — E1169 Type 2 diabetes mellitus with other specified complication: Secondary | ICD-10-CM

## 2021-02-26 DIAGNOSIS — I890 Lymphedema, not elsewhere classified: Secondary | ICD-10-CM

## 2021-02-26 DIAGNOSIS — E785 Hyperlipidemia, unspecified: Secondary | ICD-10-CM

## 2021-02-26 DIAGNOSIS — E669 Obesity, unspecified: Secondary | ICD-10-CM

## 2021-02-28 ENCOUNTER — Encounter: Payer: Self-pay | Admitting: Gastroenterology

## 2021-02-28 NOTE — Anesthesia Preprocedure Evaluation (Addendum)
Anesthesia Evaluation  Patient identified by MRN, date of birth, ID band Patient awake    Reviewed: Allergy & Precautions, H&P , NPO status , Patient's Chart, lab work & pertinent test results  Airway Mallampati: II  TM Distance: >3 FB Neck ROM: full    Dental no notable dental hx.    Pulmonary COPD, former smoker,    Pulmonary exam normal breath sounds clear to auscultation       Cardiovascular + CAD and + Peripheral Vascular Disease  Normal cardiovascular exam Rhythm:regular Rate:Normal  See cardiac note from 5/13:  Preprocedure cardiac risk stratification: Scheduled to undergo a colonoscopy in the setting of abnormal Cologuard screening.  Per Revised Cardiac Risk Index she is low risk for noncardiac procedure with a 0.9% estimated rate of MI, pulmonary edema, VF, cardiac arrest, or complete heart block.  Per Duke Activity Status Index she is able to achieve >4 METs.  She may proceed with noncardiac procedure at an overall low risk without further cardiac testing.   Neuro/Psych  Neuromuscular disease CVA    GI/Hepatic GERD  ,  Endo/Other  diabetesMorbid obesity  Renal/GU      Musculoskeletal   Abdominal   Peds  Hematology   Anesthesia Other Findings   Reproductive/Obstetrics                            Anesthesia Physical Anesthesia Plan  ASA: III  Anesthesia Plan: General   Post-op Pain Management:    Induction: Intravenous  PONV Risk Score and Plan: 3 and Treatment may vary due to age or medical condition, Propofol infusion and TIVA  Airway Management Planned: Natural Airway  Additional Equipment:   Intra-op Plan:   Post-operative Plan:   Informed Consent: I have reviewed the patients History and Physical, chart, labs and discussed the procedure including the risks, benefits and alternatives for the proposed anesthesia with the patient or authorized representative who has  indicated his/her understanding and acceptance.     Dental Advisory Given  Plan Discussed with: CRNA  Anesthesia Plan Comments:         Anesthesia Quick Evaluation

## 2021-03-04 ENCOUNTER — Encounter (INDEPENDENT_AMBULATORY_CARE_PROVIDER_SITE_OTHER): Payer: Self-pay | Admitting: Nurse Practitioner

## 2021-03-04 NOTE — Progress Notes (Signed)
Subjective:    Patient ID: Sharon [name], female    DOB: 1945/10/17, 75 y.o.   MRN: [id number] Chief Complaint  Patient presents with  . AAA    6 month ultrasound follow up    Sharon [name] is a 75 year old woman that presents today for evaluation of her abdominal aortic aneurysm as well as her lymphedema.  Patient denies any claudication-like symptoms or distal embolization symptoms.  She denies any abdominal pain.  As far as her lymphedema the patient was consistently going to lymphedema clinic for lymphatic massages.  This was doing an excellent job of helping her maintain her lower extremities with swelling as well as pain and discomfort.  However the patient notes that they recently told her she Met Medicaid requirements and now they are following up with her to check her swelling but no lymphatic massage is being done.  She was also told that they did not do maintenance therapy.  Today the patient's legs are definitely more erythematous than they have been previously.  There is also noticeable fluid accumulation.  The patient continues to wear her medical grade compression socks as directed.  She utilizes 20-30.  She also utilize her lymphedema pump twice a day as instructed as well.  She elevates her lower extremities as much as she is able to.  The patient is also on prophylactic penicillin for cellulitis prevention as she had numerous episodes several years ago.  Today noninvasive studies show an abdominal aortic aneurysm at 3.8 cm.  Her previous diameter is measured at 3.3 cm on 09/11/2020.   Review of Systems  Cardiovascular: Positive for leg swelling.  All other systems reviewed and are negative.      Objective:   Physical Exam Vitals reviewed.  HENT:     Head: Normocephalic.  Cardiovascular:     Rate and Rhythm: Normal rate.     Pulses: Normal pulses.  Pulmonary:     Effort: Pulmonary effort is normal.  Musculoskeletal:     Right lower leg: 1+ Edema present.     Left  lower leg: 1+ Edema present.  Neurological:     Mental Status: She is alert and oriented to person, place, and time.  Psychiatric:        Mood and Affect: Mood normal.        Behavior: Behavior normal.        Thought Content: Thought content normal.        Judgment: Judgment normal.     BP 134/76 (BP Location: Right Arm)   Pulse 79   Resp 16   Wt 214 lb 12.8 oz (97.4 kg)   LMP  (LMP Unknown)   BMI 31.95 kg/m   Past Medical History:  Diagnosis Date  . Anxiety   . Arthritis    hands, upper back  . Asthma   . COPD (chronic obstructive pulmonary disease) ([place])   . History of cervical cancer   . Menopausal disorder   . Osteoporosis   . Pneumonia 1960  . Spasm of abdominal muscles of right side    intermittent  . Stroke ([place]) 2019  . TMJ (dislocation of temporomandibular joint)   . Wears dentures    partial lower    Social History   Socioeconomic History  . Marital status: Married    Spouse name: Shakti Fleer  . Number of children: 1  . Years of education: Not on file  . Highest education level: Some college, no degree  Occupational History  .  Occupation: retired  Tobacco Use  . Smoking status: Former Smoker    Years: 56.00    Types: Cigarettes    Quit date: 07/14/2016    Years since quitting: 4.6  . Smokeless tobacco: Never Used  Vaping Use  . Vaping Use: Former  Substance and Sexual Activity  . Alcohol use: Yes    Alcohol/week: 0.0 standard drinks    Comment: occassional  . Drug use: No  . Sexual activity: Not Currently    Birth control/protection: Post-menopausal  Other Topics Concern  . 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 Difficulty of Paying Living Expenses: Not hard at all  Food Insecurity: No Food Insecurity  . Worried About Charity fundraiser in the Last Year: Never true  . Ran Out of Food in the Last Year: Never true   Transportation Needs: No Transportation Needs  . Lack of Transportation (Medical): No  . Lack of Transportation (Non-Medical): No  Physical Activity: Inactive  . Days of Exercise per Week: 0 days  . Minutes of Exercise per Session: 0 min  Stress: No Stress Concern Present  . Feeling of Stress : Not at all  Social Connections: Not on file  Intimate Partner Violence: Not on file    Past Surgical History:  Procedure Laterality Date  . ABDOMINAL HYSTERECTOMY  1970's  . bladder botox  2005  . BLADDER SUSPENSION  2004  . CATARACT EXTRACTION W/PHACO Right 03/09/2018   Procedure: CATARACT EXTRACTION PHACO AND INTRAOCULAR LENS PLACEMENT ([place]) right;  Surgeon: Eulogio Bear, MD;  Location: [place];  Service: Ophthalmology;  Laterality: Right;  CALL CELL 1ST  . CATARACT EXTRACTION W/PHACO Left 04/19/2018   Procedure: CATARACT EXTRACTION PHACO AND INTRAOCULAR LENS PLACEMENT ([facility])  LEFT;  Surgeon: Eulogio Bear, MD;  Location: [place];  Service: Ophthalmology;  Laterality: Left;  . COLONOSCOPY WITH PROPOFOL N/A 12/13/2015   Procedure: COLONOSCOPY WITH PROPOFOL;  Surgeon: Lucilla Lame, MD;  Location: [place];  Service: Endoscopy;  Laterality: N/A;  . LOOP RECORDER INSERTION N/A 01/07/2018   Procedure: LOOP RECORDER INSERTION;  Surgeon: Deboraha Sprang, MD;  Location: [facility];  Service: Cardiovascular;  Laterality: N/A;  . POLYPECTOMY N/A 12/13/2015   Procedure: POLYPECTOMY;  Surgeon: Lucilla Lame, MD;  Location: [facility];  Service: Endoscopy;  Laterality: N/A;  SIGMOID COLON POLYPS X  5  . SHOULDER ARTHROSCOPY W/ ROTATOR CUFF REPAIR Right 1998  . TEE WITHOUT CARDIOVERSION N/A 01/06/2018   Procedure: TRANSESOPHAGEAL ECHOCARDIOGRAM (TEE);  Surgeon: Minna Merritts, MD;  Location: [facility];  Service: Cardiovascular;  Laterality: N/A;  . TONSILLECTOMY AND ADENOIDECTOMY      Family History  Problem Relation Age of Onset  . Diabetes Mother    . Heart disease Mother   . Stroke Mother   . Stroke Maternal Grandmother     Allergies  Allergen Reactions  . Statins Other (See Comments)    myalgia  . Quinolones     FLUOROQUINOLONES - Pt reports she was told to NEVER take these.  . Baclofen Swelling  . Bactrim [Sulfamethoxazole-Trimethoprim] Nausea And Vomiting  . Ibuprofen Rash    Mouth swelling  . Latex Rash    Some bandaids, some gloves; BLOOD TEST NEGATIVE  . Librium [Chlordiazepoxide] Itching    Dizziness   . Naprosyn [Naproxen] Rash    Mouth  swelling  . Other Rash    [place] nuts - mouth swelling    CBC Latest Ref Rng & Units 07/17/2020 01/26/2020 06/28/2019  WBC 3.4 - 10.8 x10E3/uL 7.2 9.0 7.7  Hemoglobin 11.1 - 15.9 g/dL 14.4 15.0 14.4  Hematocrit 34.0 - 46.6 % 43.6 43.6 44.6  Platelets 150 - 450 x10E3/uL 201 201 250      CMP     Component Value Date/Time   NA 143 01/15/2021 1429   K 5.0 01/15/2021 1429   CL 100 01/15/2021 1429   CO2 21 01/15/2021 1429   GLUCOSE 84 01/15/2021 1429   GLUCOSE 166 (H) 05/28/2019 1408   BUN 14 01/15/2021 1429   CREATININE 0.92 01/15/2021 1429   CALCIUM 9.7 01/15/2021 1429   PROT 6.7 01/15/2021 1429   ALBUMIN 4.4 01/15/2021 1429   AST 20 01/15/2021 1429   ALT 20 01/15/2021 1429   ALKPHOS 74 01/15/2021 1429   BILITOT 0.3 01/15/2021 1429   GFRNONAA 58 (L) 07/17/2020 1509   GFRAA 67 07/17/2020 1509     No results found.     Assessment & Plan:   1. Abdominal aortic aneurysm (AAA) without rupture ([place]) No surgery or intervention at this time. The patient has an asymptomatic abdominal aortic aneurysm that is less than 4 cm in maximal diameter.  I have discussed the natural history of abdominal aortic aneurysm and the small risk of rupture for aneurysm less than 5 cm in size.  However, as these small aneurysms tend to enlarge over time, continued surveillance with ultrasound or CT scan is mandatory.  I have also discussed optimizing medical management with hypertension  and lipid control and the importance of abstinence from tobacco.  The patient is also encouraged to exercise a minimum of 30 minutes 4 times a week.  Should the patient develop new onset abdominal or back pain or signs of peripheral embolization they are instructed to seek medical attention immediately and to alert the physician providing care that they have an aneurysm.  The patient voices their understanding. Typically we will follow this aneurysm Annual basis but given the amount of growth seen in 6 months we will obtain an ultrasound in 6 months again to determine if this is rapid growth or just a measurement difference.  If the growth continues to be elevated we may consider a CT scan at next review.   2. Lymphedema While the patient's lower extremity edema is not at its worst it is certainly worse than what she was previously maintained while she was able to be seen at the lymphedema clinic on a regular basis.  I believe that regular lymphatic massage would be beneficial for the patient even if it was only done on a monthly basis.  We will attempt home with occupational therapy to see if this is possible but not [place] to the discomfort of the abdomen for the patient.  3. Hyperlipidemia associated with type 2 diabetes mellitus ([place]) Continue statin as ordered and reviewed, no changes at this time   4. Type 2 diabetes mellitus with obesity ([place]) Continue hypoglycemic medications as already ordered, these medications have been reviewed and there are no changes at this time.  Hgb A1C to be monitored as already arranged by primary service    Current Outpatient Medications on File Prior to Visit  Medication Sig Dispense Refill  . acetaminophen (TYLENOL) 500 MG tablet Take 500 mg by mouth every 6 (six) hours as needed.    . Blood Glucose Monitoring Suppl (  ONETOUCH VERIO) w/Device KIT Utilize to check blood sugar twice a day, fasting in morning with goal < 130 and then 2 hours after a meal  with goal <180.  Document and bring to visits. 1 kit 0  . budesonide-formoterol (SYMBICORT) 160-4.5 MCG/ACT inhaler INHALE 2 PUFFS BY MOUTH TWICE DAILY 10.2 g 2  . Cholecalciferol 1.25 MG (50000 UT) TABS Take 1 tablet by mouth once a week. 14 tablet 4  . citalopram (CELEXA) 20 MG tablet Take 20 mg by mouth as needed.    . clopidogrel (PLAVIX) 75 MG tablet TAKE 1 TABLET(75 MG) BY MOUTH DAILY 90 tablet 4  . Cyanocobalamin 1000 MCG/ML KIT Inject 1,000 mcg as directed every 30 (thirty) days.    . furosemide (LASIX) 20 MG tablet Take 0.5 tablets (10 mg total) by mouth daily. 30 tablet 3  . glucose blood (ONETOUCH VERIO) test strip Utilize to check blood sugar twice a day, fasting in morning with goal < 130 and then 2 hours after a meal with goal <180.  Document and bring to visits. 100 each 12  . Lancets (ONETOUCH ULTRASOFT) lancets Utilize to check blood sugar twice a day, fasting in morning with goal < 130 and then 2 hours after a meal with goal <180.  Document and bring to visits. 100 each 12  . metFORMIN (GLUCOPHAGE) 500 MG tablet Take 1 tablet (500 mg total) by mouth daily with breakfast. 90 tablet 3  . mometasone (ELOCON) 0.1 % cream Apply to lower legs twice daily until rash improved. Avoid face, groin, underarms. 50 g 1  . pantoprazole (PROTONIX) 40 MG tablet TAKE 1 TABLET(40 MG) BY MOUTH DAILY 90 tablet 4  . penicillin v potassium (VEETID) 250 MG tablet TAKE 1 TABLET(250 MG) BY MOUTH TWICE DAILY 60 tablet 6  . PROAIR HFA 108 (90 Base) MCG/ACT inhaler INHALE 2 PUFFS INTO THE LUNGS EVERY 6 HOURS AS NEEDED FOR WHEEZING OR SHORTNESS OF BREATH 8.5 g 2  . tacrolimus (PROTOPIC) 0.1 % ointment Apply to lower legs 1-2 times a day until rash improved. 100 g 2   No current facility-administered medications on file prior to visit.    There are no Patient Instructions on file for this visit. No follow-ups on file.   Kris Hartmann, NP

## 2021-03-04 NOTE — Progress Notes (Signed)
Carelink Summary Report / Loop Recorder 

## 2021-03-06 LAB — HM DIABETES EYE EXAM

## 2021-03-13 NOTE — Discharge Instructions (Signed)

## 2021-03-14 ENCOUNTER — Encounter
Admission: RE | Disposition: A | Discharge status: Home / Self Care | Payer: Self-pay | Source: Home / Self Care | Attending: Gastroenterology

## 2021-03-14 ENCOUNTER — Ambulatory Visit: Payer: Medicare Other | Admitting: Anesthesiology

## 2021-03-14 ENCOUNTER — Other Ambulatory Visit: Payer: Self-pay

## 2021-03-14 ENCOUNTER — Ambulatory Visit
Admission: RE | Admit: 2021-03-14 | Discharge: 2021-03-14 | Disposition: A | Discharge status: Home / Self Care | Payer: Medicare Other | Attending: Gastroenterology | Admitting: Gastroenterology

## 2021-03-14 ENCOUNTER — Encounter: Payer: Self-pay | Admitting: Gastroenterology

## 2021-03-14 DIAGNOSIS — Z7902 Long term (current) use of antithrombotics/antiplatelets: Secondary | ICD-10-CM | POA: Insufficient documentation

## 2021-03-14 DIAGNOSIS — Z90710 Acquired absence of both cervix and uterus: Secondary | ICD-10-CM | POA: Insufficient documentation

## 2021-03-14 DIAGNOSIS — J449 Chronic obstructive pulmonary disease, unspecified: Secondary | ICD-10-CM | POA: Diagnosis not present

## 2021-03-14 DIAGNOSIS — K648 Other hemorrhoids: Secondary | ICD-10-CM | POA: Diagnosis not present

## 2021-03-14 DIAGNOSIS — Z87891 Personal history of nicotine dependence: Secondary | ICD-10-CM | POA: Insufficient documentation

## 2021-03-14 DIAGNOSIS — Z823 Family history of stroke: Secondary | ICD-10-CM | POA: Insufficient documentation

## 2021-03-14 DIAGNOSIS — Z881 Allergy status to other antibiotic agents status: Secondary | ICD-10-CM | POA: Insufficient documentation

## 2021-03-14 DIAGNOSIS — K5730 Diverticulosis of large intestine without perforation or abscess without bleeding: Secondary | ICD-10-CM | POA: Insufficient documentation

## 2021-03-14 DIAGNOSIS — Z860100 Personal history of colon polyps, unspecified: Secondary | ICD-10-CM

## 2021-03-14 DIAGNOSIS — Z886 Allergy status to analgesic agent status: Secondary | ICD-10-CM | POA: Insufficient documentation

## 2021-03-14 DIAGNOSIS — D128 Benign neoplasm of rectum: Secondary | ICD-10-CM | POA: Diagnosis not present

## 2021-03-14 DIAGNOSIS — R195 Other fecal abnormalities: Secondary | ICD-10-CM

## 2021-03-14 DIAGNOSIS — Z8541 Personal history of malignant neoplasm of cervix uteri: Secondary | ICD-10-CM | POA: Insufficient documentation

## 2021-03-14 DIAGNOSIS — Z91040 Latex allergy status: Secondary | ICD-10-CM | POA: Insufficient documentation

## 2021-03-14 DIAGNOSIS — Z79899 Other long term (current) drug therapy: Secondary | ICD-10-CM | POA: Insufficient documentation

## 2021-03-14 DIAGNOSIS — Z7984 Long term (current) use of oral hypoglycemic drugs: Secondary | ICD-10-CM | POA: Diagnosis not present

## 2021-03-14 DIAGNOSIS — K621 Rectal polyp: Secondary | ICD-10-CM

## 2021-03-14 DIAGNOSIS — Z888 Allergy status to other drugs, medicaments and biological substances status: Secondary | ICD-10-CM | POA: Insufficient documentation

## 2021-03-14 DIAGNOSIS — Z8673 Personal history of transient ischemic attack (TIA), and cerebral infarction without residual deficits: Secondary | ICD-10-CM | POA: Diagnosis not present

## 2021-03-14 DIAGNOSIS — Z1211 Encounter for screening for malignant neoplasm of colon: Secondary | ICD-10-CM | POA: Diagnosis not present

## 2021-03-14 DIAGNOSIS — K635 Polyp of colon: Secondary | ICD-10-CM | POA: Diagnosis not present

## 2021-03-14 DIAGNOSIS — Z7951 Long term (current) use of inhaled steroids: Secondary | ICD-10-CM | POA: Insufficient documentation

## 2021-03-14 HISTORY — PX: POLYPECTOMY: SHX5525

## 2021-03-14 HISTORY — PX: COLONOSCOPY WITH PROPOFOL: SHX5780

## 2021-03-14 LAB — GLUCOSE, CAPILLARY
Glucose-Capillary: 130 mg/dL — ABNORMAL HIGH (ref 70–99)
Glucose-Capillary: 123 mg/dL — ABNORMAL HIGH (ref 70–99)

## 2021-03-14 SURGERY — COLONOSCOPY WITH PROPOFOL
Anesthesia: General | Site: Rectum

## 2021-03-14 MED ORDER — LIDOCAINE HCL (CARDIAC) PF 100 MG/5ML IV SOSY
PREFILLED_SYRINGE | INTRAVENOUS | Status: DC | PRN
  Administered 2021-03-14: 30 mg via INTRAVENOUS

## 2021-03-14 MED ORDER — STERILE WATER FOR IRRIGATION IR SOLN
Status: DC | PRN
  Administered 2021-03-14: 150 mL

## 2021-03-14 MED ORDER — SODIUM CHLORIDE 0.9 % IV SOLN: INTRAVENOUS | Status: DC

## 2021-03-14 MED ORDER — LACTATED RINGERS IV SOLN: INTRAVENOUS | Status: DC

## 2021-03-14 MED ORDER — PROPOFOL 10 MG/ML IV BOLUS
INTRAVENOUS | Status: DC | PRN
  Administered 2021-03-14 (×9): 30 mg via INTRAVENOUS
  Administered 2021-03-14: 100 mg via INTRAVENOUS

## 2021-03-14 SURGICAL SUPPLY — 22 items
CLIP HMST 235XBRD CATH ROT (MISCELLANEOUS) IMPLANT
CLIP RESOLUTION 360 11X235 (MISCELLANEOUS)
ELECT REM PT RETURN 9FT ADLT (ELECTROSURGICAL)
ELECTRODE REM PT RTRN 9FT ADLT (ELECTROSURGICAL) IMPLANT
FORCEPS BIOP RAD 4 LRG CAP 4 (CUTTING FORCEPS) IMPLANT
GOWN CVR UNV OPN BCK APRN NK (MISCELLANEOUS) ×2 IMPLANT
GOWN ISOL THUMB LOOP REG UNIV (MISCELLANEOUS) ×4
INJECTOR VARIJECT VIN23 (MISCELLANEOUS) IMPLANT
KIT DEFENDO VALVE AND CONN (KITS) IMPLANT
KIT PRC NS LF DISP ENDO (KITS) ×1 IMPLANT
KIT PROCEDURE OLYMPUS (KITS) ×2
MANIFOLD NEPTUNE II (INSTRUMENTS) ×2 IMPLANT
MARKER SPOT ENDO TATTOO 5ML (MISCELLANEOUS) IMPLANT
PROBE APC STR FIRE (PROBE) IMPLANT
RETRIEVER NET ROTH 2.5X230 LF (MISCELLANEOUS) IMPLANT
SNARE COLD EXACTO (MISCELLANEOUS) ×2 IMPLANT
SNARE SHORT THROW 13M SML OVAL (MISCELLANEOUS) IMPLANT
SNARE SNG USE RND 15MM (INSTRUMENTS) IMPLANT
SPOT EX ENDOSCOPIC TATTOO (MISCELLANEOUS)
TRAP ETRAP POLY (MISCELLANEOUS) ×2 IMPLANT
VARIJECT INJECTOR VIN23 (MISCELLANEOUS)
WATER STERILE IRR 250ML POUR (IV SOLUTION) ×2 IMPLANT

## 2021-03-14 NOTE — H&P (Signed)
Lucilla Lame, MD [facility] [address], [place], [place] [address] [phone] Fax : [phone]  Primary Care Physician:  [name], NP Primary Gastroenterologist:  Dr. Allen Norris  Pre-Procedure History & Physical: HPI:  DEISY OZBUN is a 75 y.o. female is here for an colonoscopy.   Past Medical History:  Diagnosis Date  . Anxiety   . Arthritis    hands, upper back  . Asthma   . COPD (chronic obstructive pulmonary disease) ([place])   . History of cervical cancer   . Menopausal disorder   . Osteoporosis   . Pneumonia 1960  . Spasm of abdominal muscles of right side    intermittent  . Stroke ([place]) 2019  . TMJ (dislocation of temporomandibular joint)   . Wears dentures    partial lower    Past Surgical History:  Procedure Laterality Date  . ABDOMINAL HYSTERECTOMY  1970's  . bladder botox  2005  . BLADDER SUSPENSION  2004  . CATARACT EXTRACTION W/PHACO Right 03/09/2018   Procedure: CATARACT EXTRACTION PHACO AND INTRAOCULAR LENS PLACEMENT ([place]) right;  Surgeon: Eulogio Bear, MD;  Location: [place];  Service: Ophthalmology;  Laterality: Right;  CALL CELL 1ST  . CATARACT EXTRACTION W/PHACO Left 04/19/2018   Procedure: CATARACT EXTRACTION PHACO AND INTRAOCULAR LENS PLACEMENT ([facility])  LEFT;  Surgeon: Eulogio Bear, MD;  Location: [place];  Service: Ophthalmology;  Laterality: Left;  . COLONOSCOPY WITH PROPOFOL N/A 12/13/2015   Procedure: COLONOSCOPY WITH PROPOFOL;  Surgeon: Lucilla Lame, MD;  Location: [place];  Service: Endoscopy;  Laterality: N/A;  . LOOP RECORDER INSERTION N/A 01/07/2018   Procedure: LOOP RECORDER INSERTION;  Surgeon: Deboraha Sprang, MD;  Location: [place] [facility];  Service: Cardiovascular;  Laterality: N/A;  . POLYPECTOMY N/A 12/13/2015   Procedure: POLYPECTOMY;  Surgeon: Lucilla Lame, MD;  Location: [facility];  Service: Endoscopy;  Laterality: N/A;  SIGMOID COLON POLYPS X  5  . SHOULDER  ARTHROSCOPY W/ ROTATOR CUFF REPAIR Right 1998  . TEE WITHOUT CARDIOVERSION N/A 01/06/2018   Procedure: TRANSESOPHAGEAL ECHOCARDIOGRAM (TEE);  Surgeon: Minna Merritts, MD;  Location: [facility];  Service: Cardiovascular;  Laterality: N/A;  . TONSILLECTOMY AND ADENOIDECTOMY      Prior to Admission medications   Medication Sig Start Date End Date Taking? Authorizing Provider  acetaminophen (TYLENOL) 500 MG tablet Take 500 mg by mouth every 6 (six) hours as needed.   Yes [provider]  Blood Glucose Monitoring Suppl (ONETOUCH VERIO) w/Device KIT Utilize to check blood sugar twice a day, fasting in morning with goal < 130 and then 2 hours after a meal with goal <180.  Document and bring to visits. 07/31/20  Yes [name], Jolene T, NP  budesonide-formoterol (SYMBICORT) 160-4.5 MCG/ACT inhaler INHALE 2 PUFFS BY MOUTH TWICE DAILY 02/08/21  Yes [name], Jolene T, NP  Cholecalciferol 1.25 MG (50000 UT) TABS Take 1 tablet by mouth once a week. 02/04/21  Yes [name], Jolene T, NP  citalopram (CELEXA) 20 MG tablet Take 20 mg by mouth as needed.   Yes [provider]  clopidogrel (PLAVIX) 75 MG tablet TAKE 1 TABLET(75 MG) BY MOUTH DAILY 12/19/20  Yes [name], Jolene T, NP  Cyanocobalamin 1000 MCG/ML KIT Inject 1,000 mcg as directed every 30 (thirty) days.   Yes [provider]  furosemide (LASIX) 20 MG tablet Take 0.5 tablets (10 mg total) by mouth daily. 12/19/20  Yes [name], Jolene T, NP  glucose blood (ONETOUCH VERIO)  test strip Utilize to check blood sugar twice a day, fasting in morning with goal < 130 and then 2 hours after a meal with goal <180.  Document and bring to visits. 07/31/20  Yes [name], Jolene T, NP  Lancets (ONETOUCH ULTRASOFT) lancets Utilize to check blood sugar twice a day, fasting in morning with goal < 130 and then 2 hours after a meal with goal <180.  Document and bring to visits. 07/31/20  Yes [name], Henrine Screws T, NP  metFORMIN (GLUCOPHAGE) 500 MG tablet Take 1  tablet (500 mg total) by mouth daily with breakfast. 07/20/20  Yes [name], Jolene T, NP  mometasone (ELOCON) 0.1 % cream Apply to lower legs twice daily until rash improved. Avoid face, groin, underarms. 12/17/20  Yes Brendolyn Patty, MD  pantoprazole (PROTONIX) 40 MG tablet TAKE 1 TABLET(40 MG) BY MOUTH DAILY 07/17/20  Yes [name], Jolene T, NP  penicillin v potassium (VEETID) 250 MG tablet TAKE 1 TABLET(250 MG) BY MOUTH TWICE DAILY 10/21/20  Yes Kris Hartmann, NP  PROAIR HFA 108 (90 Base) MCG/ACT inhaler INHALE 2 PUFFS INTO THE LUNGS EVERY 6 HOURS AS NEEDED FOR WHEEZING OR SHORTNESS OF BREATH 05/24/20  Yes [name], Jolene T, NP  tacrolimus (PROTOPIC) 0.1 % ointment Apply to lower legs 1-2 times a day until rash improved. 12/17/20  Yes Brendolyn Patty, MD    Allergies as of 02/19/2021 - Review Complete 02/12/2021  Allergen Reaction Noted  . Statins Other (See Comments) 01/15/2021  . Baclofen Swelling 04/08/2018  . Bactrim [sulfamethoxazole-trimethoprim] Nausea And Vomiting 09/15/2016  . Ibuprofen Rash 02/08/2015  . Latex Rash 06/21/2015  . Librium [chlordiazepoxide] Itching 03/13/2017  . Naprosyn [naproxen] Rash 02/08/2015  . Other Rash 12/05/2015    Family History  Problem Relation Age of Onset  . Diabetes Mother   . Heart disease Mother   . Stroke Mother   . Stroke Maternal Grandmother     Social History   Socioeconomic History  . Marital status: Married    Spouse name: Kinslea Frances  . Number of children: 1  . Years of education: Not on file  . Highest education level: Some college, no degree  Occupational History  . Occupation: retired  Tobacco Use  . Smoking status: Former Smoker    Years: 56.00    Types: Cigarettes    Quit date: 07/14/2016    Years since quitting: 4.6  . Smokeless tobacco: Never Used  Vaping Use  . Vaping Use: Former  Substance and Sexual Activity  . Alcohol use: Yes    Alcohol/week: 0.0 standard drinks    Comment: occassional  . Drug use: No  . Sexual  activity: Not Currently    Birth control/protection: Post-menopausal  Other Topics Concern  . 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 Difficulty of Paying Living Expenses: Not hard at all  Food Insecurity: No Food Insecurity  . Worried About Charity fundraiser in the Last Year: Never true  . Ran Out of Food in the Last Year: Never true  Transportation Needs: No Transportation Needs  . Lack of Transportation (Medical): No  . Lack of Transportation (Non-Medical): No  Physical Activity: Inactive  . Days of Exercise per Week: 0 days  . Minutes of Exercise per Session: 0 min  Stress: No Stress Concern Present  . Feeling of Stress : Not at all  Social Connections:  Not on file  Intimate Partner Violence: Not on file    Review of Systems: See HPI, otherwise negative ROS  Physical Exam: BP 125/69   Pulse 84   Temp (!) 97.4 F (36.3 C) (Temporal)   Ht 5' 8.75" (1.746 m)   Wt 96.6 kg   LMP  (LMP Unknown)   SpO2 99%   BMI 31.68 kg/m  General:   Alert,  pleasant and cooperative in NAD Head:  Normocephalic and atraumatic. Neck:  Supple; no masses or thyromegaly. Lungs:  Clear throughout to auscultation.    Heart:  Regular rate and rhythm. Abdomen:  Soft, nontender and nondistended. Normal bowel sounds, without guarding, and without rebound.   Neurologic:  Alert and  oriented x4;  grossly normal neurologically.  Impression/Plan: [name] is here for an colonoscopy to be performed for a history of adenomatous polyps on    Risks, benefits, limitations, and alternatives regarding  colonoscopy have been reviewed with the patient.  Questions have been answered.  All parties agreeable.   Lucilla Lame, MD  03/14/2021, 7:51 AM

## 2021-03-14 NOTE — Anesthesia Procedure Notes (Signed)
Procedure Name: MAC Date/Time: 03/14/2021 7:55 AM Performed by: Vanetta Shawl, CRNA Pre-anesthesia Checklist: Patient identified, Emergency Drugs available, Suction available, Timeout performed and Patient being monitored Patient Re-evaluated:Patient Re-evaluated prior to induction Oxygen Delivery Method: Nasal cannula Placement Confirmation: positive ETCO2

## 2021-03-14 NOTE — Anesthesia Postprocedure Evaluation (Signed)
Anesthesia Post Note  Patient: Sharon [name]  Procedure(s) Performed: COLONOSCOPY WITH PROPOFOL (N/A Rectum) POLYPECTOMY (N/A Rectum)     Patient location during evaluation: PACU Anesthesia Type: General Level of consciousness: awake and alert and oriented Pain management: satisfactory to patient Vital Signs Assessment: post-procedure vital signs reviewed and stable Respiratory status: spontaneous breathing, nonlabored ventilation and respiratory function stable Cardiovascular status: blood pressure returned to baseline and stable Postop Assessment: Adequate PO intake and No signs of nausea or vomiting Anesthetic complications: no   No complications documented.  Raliegh Ip

## 2021-03-14 NOTE — Transfer of Care (Signed)
Immediate Anesthesia Transfer of Care Note  Patient: Sharon [name]  Procedure(s) Performed: COLONOSCOPY WITH PROPOFOL (N/A Rectum) POLYPECTOMY (N/A Rectum)  Patient Location: PACU  Anesthesia Type: General  Level of Consciousness: awake, alert  and patient cooperative  Airway and Oxygen Therapy: Patient Spontanous Breathing   Post-op Assessment: Post-op Vital signs reviewed, Patient's Cardiovascular Status Stable, Respiratory Function Stable, Patent Airway and No signs of Nausea or vomiting  Post-op Vital Signs: Reviewed and stable  Complications: No complications documented.

## 2021-03-14 NOTE — Op Note (Addendum)
[facility] Gastroenterology Patient Name: Sharon [name] Procedure Date: 03/14/2021 7:19 AM MRN: [id number] Account #: [id number] Date of Birth: 12/30/1945 Admit Type: Outpatient Age: 75 Room: [facility] OR ROOM 01 Gender: Female Note Status: Finalized Procedure:             Colonoscopy Indications:           High risk colon cancer surveillance: Personal history                         of colonic polyps Providers:             Lucilla Lame MD, MD Referring MD:          [name]. [name] (Referring MD) Medicines:             Propofol per Anesthesia Complications:         No immediate complications. Procedure:             Pre-Anesthesia Assessment:                        - Prior to the procedure, a History and Physical was                         performed, and patient medications and allergies were                         reviewed. The patient's tolerance of previous                         anesthesia was also reviewed. The risks and benefits                         of the procedure and the sedation options and risks                         were discussed with the patient. All questions were                         answered, and informed consent was obtained. Prior                         Anticoagulants: The patient has taken no previous                         anticoagulant or antiplatelet agents. ASA Grade                         Assessment: II - A patient with mild systemic disease.                         After reviewing the risks and benefits, the patient                         was deemed in satisfactory condition to undergo the                         procedure.  After obtaining informed consent, the colonoscope was                         passed under direct vision. Throughout the procedure,                         the patient's blood pressure, pulse, and oxygen                         saturations were monitored continuously. The was                          introduced through the anus and advanced to the the                         cecum, identified by appendiceal orifice and ileocecal                         valve. The colonoscopy was performed without                         difficulty. The patient tolerated the procedure well.                         The quality of the bowel preparation was good. The                         colonoscopy was performed without difficulty. The                         patient tolerated the procedure well. The quality of                         the bowel preparation was good. Findings:      The perianal and digital rectal examinations were normal.      Five sessile polyps were found in the rectum. The polyps were 2 to 4 mm       in size. These polyps were removed with a cold snare. Resection and       retrieval were complete.      Multiple small-mouthed diverticula were found in the sigmoid colon.      Non-bleeding internal hemorrhoids were found during retroflexion. The       hemorrhoids were Grade I (internal hemorrhoids that do not prolapse). Impression:            - Five 2 to 4 mm polyps in the rectum, removed with a                         cold snare. Resected and retrieved.                        - Diverticulosis in the sigmoid colon.                        - Non-bleeding internal hemorrhoids. Recommendation:        - Discharge patient to home.                        -  Resume previous diet.                        - Continue present medications.                        - Await pathology results. Procedure Code(s):     --- Professional ---                        [phone], Colonoscopy, flexible; with removal of                         tumor(s), polyp(s), or other lesion(s) by snare                         technique Diagnosis Code(s):     --- Professional ---                        Z86.010, Personal history of colonic polyps                        K62.1, Rectal polyp CPT copyright 2019 American  Medical Association. All rights reserved. The codes documented in this report are preliminary and upon coder review may  be revised to meet current compliance requirements. Lucilla Lame MD, MD 03/14/2021 8:19:55 AM This report has been signed electronically. Number of Addenda: 0 Note Initiated On: 03/14/2021 7:19 AM Scope Withdrawal Time: 0 hours 8 minutes 38 seconds  Total Procedure Duration: 0 hours 19 minutes 53 seconds  Estimated Blood Loss:  Estimated blood loss: none.      [facility]

## 2021-03-17 LAB — CUP PACEART REMOTE DEVICE CHECK
Date Time Interrogation Session: 20220602232204
Implantable Pulse Generator Implant Date: 20190328

## 2021-03-18 ENCOUNTER — Other Ambulatory Visit: Payer: Self-pay

## 2021-03-18 ENCOUNTER — Telehealth (INDEPENDENT_AMBULATORY_CARE_PROVIDER_SITE_OTHER): Payer: Medicare Other | Admitting: Pharmacist

## 2021-03-18 ENCOUNTER — Ambulatory Visit (INDEPENDENT_AMBULATORY_CARE_PROVIDER_SITE_OTHER): Payer: Medicare Other

## 2021-03-18 ENCOUNTER — Encounter: Payer: Self-pay | Admitting: Gastroenterology

## 2021-03-18 DIAGNOSIS — I700 Atherosclerosis of aorta: Secondary | ICD-10-CM

## 2021-03-18 DIAGNOSIS — I2510 Atherosclerotic heart disease of native coronary artery without angina pectoris: Secondary | ICD-10-CM

## 2021-03-18 DIAGNOSIS — E1351 Other specified diabetes mellitus with diabetic peripheral angiopathy without gangrene: Secondary | ICD-10-CM

## 2021-03-18 DIAGNOSIS — T466X5A Adverse effect of antihyperlipidemic and antiarteriosclerotic drugs, initial encounter: Secondary | ICD-10-CM | POA: Insufficient documentation

## 2021-03-18 DIAGNOSIS — Z8673 Personal history of transient ischemic attack (TIA), and cerebral infarction without residual deficits: Secondary | ICD-10-CM | POA: Diagnosis not present

## 2021-03-18 DIAGNOSIS — M791 Myalgia, unspecified site: Secondary | ICD-10-CM

## 2021-03-18 LAB — SURGICAL PATHOLOGY

## 2021-03-18 NOTE — Patient Instructions (Addendum)
LDL goal <55  Update lipid panel in [place] therapy

## 2021-03-18 NOTE — Progress Notes (Signed)
Patient ID: DYLIN IHNEN                 DOB: 08-Jun-1946                    MRN: [id number]     HPI: Sharon [name] is a 75 y.o. female patient referred to lipid clinic by Christell Faith. PMH is significant for CAD,CVA,  TIA, aortic atherosclerosis, HLD, and T2DM.    Spoke with patient over the phone.  Patient lives with husband who is undergoing dialysis so they do not travel very much. Does not have other family in area so wants to avoid being hospitalized.  Mother lived until 70 years old but father passed away when she was only 41 years old so does not know his medical history.  Was very physically active until her stroke. 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 Follows a heart healthy and DM friendly diet of salads, fish, pork, vegetables, fruits and whole grains.  Does not eat much red meat anymore.  Drinks alcohol rarely, only if she goes out.    Has intolerance to statins, all caused muscle pain especially in her hands. Reports she does not care for needles.  Patient confused why LDL has increased so much since her diet has been healthy.  In October 2021, LDL was 66.  Has more than doubled 6 months later without any changes.  Current Medications: n/a Intolerances: atorvastatin, rosuvastatin, simvastatin Risk Factors: Hx of TIA/CVA, CAD, DM, smoking history LDL goal: <55  Diet:  Salads, pork, fish, no fried foods, fruits, whole grain bread  Social History: very little alcohol  Labs:  TC 223, HDL 60, Trigs 147, LDL 137 (01/15/21 - not on any meds)  Past Medical History:  Diagnosis Date  . Anxiety   . Arthritis    hands, upper back  . Asthma   . COPD (chronic obstructive pulmonary disease) ([place])   . History of cervical cancer   . Menopausal disorder   . Osteoporosis   . Pneumonia 1960  . Spasm of abdominal muscles of right side    intermittent  . Stroke ([place]) 2019  . TMJ (dislocation of temporomandibular joint)   . Wears  dentures    partial lower    Current Outpatient Medications on File Prior to Visit  Medication Sig Dispense Refill  . acetaminophen (TYLENOL) 500 MG tablet Take 500 mg by mouth every 6 (six) hours as needed.    . Blood Glucose Monitoring Suppl (ONETOUCH VERIO) w/Device KIT Utilize to check blood sugar twice a day, fasting in morning with goal < 130 and then 2 hours after a meal with goal <180.  Document and bring to visits. 1 kit 0  . budesonide-formoterol (SYMBICORT) 160-4.5 MCG/ACT inhaler INHALE 2 PUFFS BY MOUTH TWICE DAILY 10.2 g 2  . Cholecalciferol 1.25 MG (50000 UT) TABS Take 1 tablet by mouth once a week. 14 tablet 4  . citalopram (CELEXA) 20 MG tablet Take 20 mg by mouth as needed.    . clopidogrel (PLAVIX) 75 MG tablet TAKE 1 TABLET(75 MG) BY MOUTH DAILY 90 tablet 4  . Cyanocobalamin 1000 MCG/ML KIT Inject 1,000 mcg as directed every 30 (thirty) days.    . furosemide (LASIX) 20 MG tablet Take 0.5 tablets (10 mg total) by mouth daily. 30 tablet 3  . glucose blood (ONETOUCH VERIO) test strip Utilize to check blood sugar twice a day, fasting  in morning with goal < 130 and then 2 hours after a meal with goal <180.  Document and bring to visits. 100 each 12  . Lancets (ONETOUCH ULTRASOFT) lancets Utilize to check blood sugar twice a day, fasting in morning with goal < 130 and then 2 hours after a meal with goal <180.  Document and bring to visits. 100 each 12  . metFORMIN (GLUCOPHAGE) 500 MG tablet Take 1 tablet (500 mg total) by mouth daily with breakfast. 90 tablet 3  . mometasone (ELOCON) 0.1 % cream Apply to lower legs twice daily until rash improved. Avoid face, groin, underarms. 50 g 1  . pantoprazole (PROTONIX) 40 MG tablet TAKE 1 TABLET(40 MG) BY MOUTH DAILY 90 tablet 4  . penicillin v potassium (VEETID) 250 MG tablet TAKE 1 TABLET(250 MG) BY MOUTH TWICE DAILY 60 tablet 6  . PROAIR HFA 108 (90 Base) MCG/ACT inhaler INHALE 2 PUFFS INTO THE LUNGS EVERY 6 HOURS AS NEEDED FOR WHEEZING OR  SHORTNESS OF BREATH 8.5 g 2  . tacrolimus (PROTOPIC) 0.1 % ointment Apply to lower legs 1-2 times a day until rash improved. 100 g 2   No current facility-administered medications on file prior to visit.    Allergies  Allergen Reactions  . Statins Other (See Comments)    myalgia  . Quinolones     FLUOROQUINOLONES - Pt reports she was told to NEVER take these.  . Baclofen Swelling  . Bactrim [Sulfamethoxazole-Trimethoprim] Nausea And Vomiting  . Ibuprofen Rash    Mouth swelling  . Latex Rash    Some bandaids, some gloves; BLOOD TEST NEGATIVE  . Librium [Chlordiazepoxide] Itching    Dizziness   . Naprosyn [Naproxen] Rash    Mouth swelling  . Other Rash    [place] nuts - mouth swelling    Assessment/Plan:  1. Hyperlipidemia -  Patient LDL 137 which is above goal of < 55.  Aggressive goal selected due to patients history of CVA , CAD, and DM.  Patient will need PCSK9i therapy to reach goal. Briefly explained mechanism of action, storage, site selection and administration.  Patient is hesitant due to not liking needles. Will need to be cautious due to patients listed allergy to latex.  Patient also questioning lab result since LDL doubled in past year from being 96 on 02/14/20 and 66 on 07/17/20.  Will order lipid panel and direct LDL to confirm result and then decide on next steps.  Patient reports she will likely update labs on 03/21/21.    Karren Cobble, PharmD, BCACP, [place], [place] [address], [place], [place] [address] Phone: [phone]; Fax: [phone] 03/18/2021 11:06 AM

## 2021-03-21 ENCOUNTER — Other Ambulatory Visit: Payer: Self-pay

## 2021-03-21 ENCOUNTER — Telehealth: Payer: Self-pay | Admitting: Pharmacist

## 2021-03-21 ENCOUNTER — Other Ambulatory Visit
Admission: RE | Admit: 2021-03-21 | Discharge: 2021-03-21 | Disposition: A | Discharge status: Home / Self Care | Payer: Medicare Other | Attending: Physician Assistant | Admitting: Physician Assistant

## 2021-03-21 ENCOUNTER — Ambulatory Visit (INDEPENDENT_AMBULATORY_CARE_PROVIDER_SITE_OTHER): Payer: Medicare Other

## 2021-03-21 DIAGNOSIS — I700 Atherosclerosis of aorta: Secondary | ICD-10-CM | POA: Insufficient documentation

## 2021-03-21 DIAGNOSIS — I2510 Atherosclerotic heart disease of native coronary artery without angina pectoris: Secondary | ICD-10-CM | POA: Diagnosis not present

## 2021-03-21 DIAGNOSIS — E538 Deficiency of other specified B group vitamins: Secondary | ICD-10-CM

## 2021-03-21 DIAGNOSIS — G459 Transient cerebral ischemic attack, unspecified: Secondary | ICD-10-CM

## 2021-03-21 DIAGNOSIS — E785 Hyperlipidemia, unspecified: Secondary | ICD-10-CM | POA: Insufficient documentation

## 2021-03-21 DIAGNOSIS — E1169 Type 2 diabetes mellitus with other specified complication: Secondary | ICD-10-CM

## 2021-03-21 LAB — LIPID PANEL
Cholesterol: 207 mg/dL — ABNORMAL HIGH (ref 0–200)
HDL: 59 mg/dL (ref >40)
LDL Cholesterol: 134 mg/dL — ABNORMAL HIGH (ref 0–99)
Total CHOL/HDL Ratio: 3.5 RATIO
Triglycerides: 70 mg/dL (ref <150)
VLDL: 14 mg/dL (ref 0–40)

## 2021-03-21 MED ORDER — CYANOCOBALAMIN 1000 MCG/ML IJ SOLN
1000.0000 ug | Freq: Once | INTRAMUSCULAR | Status: AC
  Administered 2021-03-21: 1000 ug via INTRAMUSCULAR

## 2021-03-21 NOTE — Telephone Encounter (Signed)
Spoke with patient regarding elevated LDL.  Patient statin intolerant with history of TIA, CAD, and DM so prefer aggressive lipid lowering.  Plan prefers Repatha however patient has skin irritation with latex.  Will submit PA for Praluent

## 2021-03-22 ENCOUNTER — Telehealth: Payer: Self-pay | Admitting: Pharmacist

## 2021-03-22 LAB — LDL CHOLESTEROL, DIRECT: Direct LDL: 142.3 mg/dL — ABNORMAL HIGH (ref 0–99)

## 2021-03-22 MED ORDER — PRALUENT 150 MG/ML ~~LOC~~ SOAJ: 1.0000 mL | SUBCUTANEOUS | 1 refills | Status: DC

## 2021-03-22 NOTE — Telephone Encounter (Signed)
PA for Praluent approved through 09/20/21.  Rx sent to pharmacy

## 2021-03-22 NOTE — Telephone Encounter (Signed)
Error

## 2021-03-29 ENCOUNTER — Telehealth: Payer: Self-pay | Admitting: Pharmacist

## 2021-03-29 NOTE — Chronic Care Management (AMB) (Signed)
Chronic Care Management Pharmacy Assistant   Name: Sharon [name]  MRN: [id number] DOB: 1946/10/07   Reason for Encounter: Disease State General adherence    Recent office visits:  01/15/21-Jolene Ned Card, NP (PCP) General follow up. Recommend she monitor BS daily at home. Labs ordered. Follow up in 6 months.  Recent consult visits:  02/26/21-Fallon Owens Shark (Vascular surgery) 6 month ultrasound follow up. 02/22/21-Ryan Purcell Mouton (Cardiology) Follow up visit. Refer her to the lipid clinic. F/u with Dr. Rockey Situ or an APP in 6 months. 02/12/21-Tara Nicole Kindred, MD (Dermatology) Venous stasis dermatitis follow up. Follow up in 3 months. 02/07/21-Theresea Arsenio Loader, [place] (Occupational therapy)  12/27/20-Theresea Arsenio Loader, OT (Occupational therapy) 12/17/20-Tara Nicole Kindred, MD (Dermatology) Seen for a rash. Recommend patient discuss starting diuretic with PCP. Recommend patient restarting physical therapy with Andrey Spearman 2x/wk. Will send referral.D/C penicillin- no evidence of cellulitis. Start mometasone cream Apply to lower legs BID until rash improved and prn flares dsp 50g 1RF. Do not use longer than 1 month at a time. Start tacrolimus ointment Apply to rash on lower legs qd/bid until improved dsp 100g 2Rf. Follow up in 6 weeks. 11/12/20-Theresea Arsenio Loader, OT (Occupational therapy)  10/03/20-Theresea Arsenio Loader, [place] (Occupational therapy) Hospital visits:  None in previous 6 months  Medications: Outpatient Encounter Medications as of 03/29/2021  Medication Sig   acetaminophen (TYLENOL) 500 MG tablet Take 500 mg by mouth every 6 (six) hours as needed.   Alirocumab (PRALUENT) 150 MG/ML SOAJ Inject 1 mL into the skin every 14 (fourteen) days.   Blood Glucose Monitoring Suppl (ONETOUCH VERIO) w/Device KIT Utilize to check blood sugar twice a day, fasting in morning with goal < 130 and then 2 hours after a meal with goal <180.  Document and bring to visits.   budesonide-formoterol (SYMBICORT) 160-4.5  MCG/ACT inhaler INHALE 2 PUFFS BY MOUTH TWICE DAILY   Cholecalciferol 1.25 MG (50000 UT) TABS Take 1 tablet by mouth once a week.   citalopram (CELEXA) 20 MG tablet Take 20 mg by mouth as needed.   clopidogrel (PLAVIX) 75 MG tablet TAKE 1 TABLET(75 MG) BY MOUTH DAILY   Cyanocobalamin 1000 MCG/ML KIT Inject 1,000 mcg as directed every 30 (thirty) days.   furosemide (LASIX) 20 MG tablet Take 0.5 tablets (10 mg total) by mouth daily.   glucose blood (ONETOUCH VERIO) test strip Utilize to check blood sugar twice a day, fasting in morning with goal < 130 and then 2 hours after a meal with goal <180.  Document and bring to visits.   Lancets (ONETOUCH ULTRASOFT) lancets Utilize to check blood sugar twice a day, fasting in morning with goal < 130 and then 2 hours after a meal with goal <180.  Document and bring to visits.   metFORMIN (GLUCOPHAGE) 500 MG tablet Take 1 tablet (500 mg total) by mouth daily with breakfast.   mometasone (ELOCON) 0.1 % cream Apply to lower legs twice daily until rash improved. Avoid face, groin, underarms.   pantoprazole (PROTONIX) 40 MG tablet TAKE 1 TABLET(40 MG) BY MOUTH DAILY   penicillin v potassium (VEETID) 250 MG tablet TAKE 1 TABLET(250 MG) BY MOUTH TWICE DAILY   PROAIR HFA 108 (90 Base) MCG/ACT inhaler INHALE 2 PUFFS INTO THE LUNGS EVERY 6 HOURS AS NEEDED FOR WHEEZING OR SHORTNESS OF BREATH   tacrolimus (PROTOPIC) 0.1 % ointment Apply to lower legs 1-2 times a day until rash improved.   No facility-administered encounter medications on file as of 03/29/2021.   Have you had any problems recently  with your health? Patient states her cardiologists mentioned that her lipid levels were elevated and was prescribed an injection Praluent every 14 days.   Have you had any problems with your pharmacy? Patient states she has not had any problems with her pharmacy.  What issues or side effects are you having with your medications? Patient states her Metformin makes her feel  lightheaded.  What would you like me to pass along to [facility] for them to help you with?  Patient states the montelukast does help with her allergies and would like an alternative.  What can we do to take care of you better? Patient states there is nothing at this time.  Star Rating Drugs: Metformin 500 mg Last filled:01/19/21 90DS   Corrie Mckusick, [place]

## 2021-04-09 NOTE — Progress Notes (Signed)
Carelink Summary Report / Loop Recorder 

## 2021-04-10 ENCOUNTER — Telehealth (INDEPENDENT_AMBULATORY_CARE_PROVIDER_SITE_OTHER): Payer: Medicare Other | Admitting: Nurse Practitioner

## 2021-04-10 ENCOUNTER — Encounter: Payer: Self-pay | Admitting: Nurse Practitioner

## 2021-04-10 ENCOUNTER — Ambulatory Visit: Payer: Medicare Other

## 2021-04-10 DIAGNOSIS — U071 COVID-19: Secondary | ICD-10-CM

## 2021-04-10 DIAGNOSIS — Z8616 Personal history of COVID-19: Secondary | ICD-10-CM | POA: Insufficient documentation

## 2021-04-10 MED ORDER — MOLNUPIRAVIR EUA 200MG CAPSULE: 4.0000 | ORAL_CAPSULE | Freq: Two times a day (BID) | ORAL | 0 refills | Status: AC

## 2021-04-10 NOTE — Assessment & Plan Note (Signed)
Acute with symptoms starting on the 25th, exactly 5 days ago.  Tested positive at home yesterday.  At this time discussed at length current treatment options as she is on the cusp for oral and could obtain IV.  She would like oral, will send in [place] -- educated her on this.  Will send to [facility] and if they do not have will send to [place].  Recommend she self quarantine for 10 days and utilize OTC medications for symptom relief.  She is scheduled to return to office in upcoming days.  Will f/u with her then.  She is aware to f/u sooner if worsening symptoms or go to ER.

## 2021-04-10 NOTE — Patient Instructions (Signed)
Molnupiravir Oral Capsules What is this medication? MOLNUPIRAVIR (mol nue pir a vir) treats COVID-19. It is an antiviral medication. It may decrease the risk of developing severe symptoms of COVID-19. It may also decrease the chance of going to the hospital. This medication is not approved by the FDA. The FDA has authorized emergency use of thismedication during the COVID-19 pandemic. This medicine may be used for other purposes; ask your health care provider orpharmacist if you have questions. What should I tell my care team before I take this medication? They need to know if you have any of these conditions: Any allergies Any serious illness An unusual or allergic reaction to molnupiravir, other medications, foods, dyes, or preservatives Pregnant or trying to get pregnant Breast-feeding How should I use this medication? Take this medication by mouth with water. Take it as directed on the prescription label at the same time every day. Do not cut, crush or chew this medication. Swallow the capsules whole. You can take it with or without food. If it upsets your stomach, take it with food. Take all of this medication unless your care team tells you to stop it early. Keep taking it even if youthink you are better. Talk to your care team about the use of this medication in children. Specialcare may be needed. Overdosage: If you think you have taken too much of this medicine contact apoison control center or emergency room at once. NOTE: This medicine is only for you. Do not share this medicine with others. What if I miss a dose? If you miss a dose, take it as soon as you can unless it is more than 10 hours late. If it is more than 10 hours late, skip the missed dose. Take the next dose at the normal time. Do not take extra or 2 doses at the same time to makeup for the missed dose. What may interact with this medication? Interactions have not been studied. This list may not describe all possible  interactions. Give your health care provider a list of all the medicines, herbs, non-prescription drugs, or dietary supplements you use. Also tell them if you smoke, drink alcohol, or use illegaldrugs. Some items may interact with your medicine. What should I watch for while using this medication? Your condition will be monitored carefully while you are receiving this medication. Visit your care team for regular checkups. Tell your care team ifyour symptoms do not start to get better or if they get worse. Do not become pregnant while taking this medication. You may need a pregnancy test before starting this medication. Women must use a reliable form of birth control while taking this medication and for 4 days after stopping the medication. Women should inform their care team if they wish to become pregnant or think they might be pregnant. Men should not father a child while taking this medication and for 3 months after stopping it. There is potential for serious harm to an unborn child. Talk to your care team for more information. Do not breast-feed an infant while taking this medication and for 4 days afterstopping the medication. What side effects may I notice from receiving this medication? Side effects that you should report to your care team as soon as possible: Allergic reactions-skin rash, itching, hives, swelling of the face, lips, tongue, or throat Side effects that usually do not require medical attention (report these toyour care team if they continue or are bothersome): Diarrhea Dizziness Nausea This list may not describe all possible  side effects. Call your doctor for medical advice about side effects. You may report side effects to FDA at1-800-FDA-1088. Where should I keep my medication? Keep out of the reach of children and pets. Store at room temperature between 20 and 25 degrees C (68 and 77 degrees F).Get rid of any unused medication after the expiration date. To get rid of  medications that are no longer needed or have expired: Take the medication to a medication take-back program. Check with your pharmacy or law enforcement to find a location. If you cannot return the medication, check the label or package insert to see if the medication should be thrown out in the garbage or flushed down the toilet. If you are not sure, ask your care team. If it is safe to put it in the trash, take the medication out of the container. Mix the medication with cat litter, dirt, coffee grounds, or other unwanted substance. Seal the mixture in a bag or container. Put it in the trash. NOTE: This sheet is a summary. It may not cover all possible information. If you have questions about this medicine, talk to your doctor, pharmacist, orhealth care provider.  2022 Elsevier/Gold Standard (2020-10-08 16:16:01)

## 2021-04-10 NOTE — Progress Notes (Signed)
Temp (!) 97.2 F (36.2 C)   LMP  (LMP Unknown)    Subjective:    Patient ID: Sharon [name], female    DOB: [birth year], 75 y.o.   MRN: [id number]  HPI: Sharon [name] is a 75 y.o. female  Chief Complaint  Patient presents with   Covid Positive    Patient states she tested positive for COVID. She tested yesterday and does not quite sure when she became symptomatic due to she was starting a new medication and some of the allergic reactions were cold , flu-like symptoms. Patient states on the 23rd- 26th she states Monday she started to feel better.    Dizziness   Nasal Congestion    Patient states it was yellowish-green and now she still has congestion in her head and it feels as if it has gotten worse. Patient denies having any fevers, but she has been taking Tylenol.     This visit was completed via video visit through MyChart due to the restrictions of the COVID-19 pandemic. All issues as above were discussed and addressed. Physical exam was done as above through visual confirmation on video through MyChart. If it was felt that the patient should be evaluated in the office, they were directed there. The patient verbally consented to this visit. Location of the patient: home Location of the provider: work Those involved with this call:  Provider: [name], DNP CMA: Irena Reichmann, [place] [facility]/Registration: Roe Rutherford  Time spent on call:  21 minutes with patient face to face via video conference. More than 50% of this time was spent in counseling and coordination of care. 15 minutes total spent in review of patient's record and preparation of their chart.  I verified patient identity using two factors (patient name and date of birth). Patient consents verbally to being seen via telemedicine visit today.    COVID POSITIVE Started with severe symptoms on the 25th, Saturday (on day 5).  Tested positive on 04/09/21.  She does endorse some dizziness today and nasal congestion.  No  fever, but has had some sweats.  Has had Covid vaccines x 3, last 08/06/20. Fever: no Cough: yes Shortness of breath: no Wheezing: no Chest pain: no Chest tightness: no Chest congestion: no Nasal congestion: yes Runny nose: yes Post nasal drip: yes Sneezing: no Sore throat: yes Swollen glands: no Sinus pressure: no Headache: no Face pain: no Toothache: no Ear pain: none Ear pressure: yes "right Eyes red/itching:no Eye drainage/crusting: no  Vomiting: no Rash: no Fatigue: yes Sick contacts: no Strep contacts: no  Context: fluctuating Recurrent sinusitis: no Relief with OTC cold/cough medications: no  Treatments attempted: cold/sinus    Relevant past medical, surgical, family and social history reviewed and updated as indicated. Interim medical history since our last visit reviewed. Allergies and medications reviewed and updated.  Review of Systems  Constitutional:  Positive for diaphoresis and fatigue. Negative for activity change, appetite change, chills and fever.  HENT:  Positive for congestion, postnasal drip, rhinorrhea and sore throat. Negative for ear discharge, ear pain, facial swelling, sinus pressure, sinus pain, sneezing and voice change.   Eyes:  Negative for pain and visual disturbance.  Respiratory:  Positive for cough. Negative for chest tightness, shortness of breath and wheezing.   Cardiovascular:  Negative for chest pain, palpitations and leg swelling.  Gastrointestinal: Negative.   Musculoskeletal:  Negative for myalgias.  Neurological: Negative.   Psychiatric/Behavioral: Negative.     Per HPI unless specifically indicated above  Objective:    Temp (!) 97.2 F (36.2 C)   LMP  (LMP Unknown)   Wt Readings from Last 3 Encounters:  03/14/21 213 lb (96.6 kg)  02/26/21 214 lb 12.8 oz (97.4 kg)  02/22/21 215 lb (97.5 kg)    Physical Exam Vitals and nursing note reviewed.  Constitutional:      General: She is awake. She is not in acute  distress.    Appearance: She is well-developed. She is not ill-appearing.  HENT:     Head: Normocephalic.     Right Ear: Hearing normal.     Left Ear: Hearing normal.  Eyes:     General: Lids are normal.        Right eye: No discharge.        Left eye: No discharge.     Conjunctiva/sclera: Conjunctivae normal.  Pulmonary:     Effort: Pulmonary effort is normal. No accessory muscle usage or respiratory distress.  Musculoskeletal:     Cervical back: Normal range of motion.  Neurological:     Mental Status: She is alert and oriented to person, place, and time.  Psychiatric:        Attention and Perception: Attention normal.        Mood and Affect: Mood normal.        Behavior: Behavior normal. Behavior is cooperative.        Thought Content: Thought content normal.        Judgment: Judgment normal.    Results for orders placed or performed during the hospital encounter of 03/21/21  Lipid panel  Result Value Ref Range   Cholesterol 207 (H) 0 - 200 mg/dL   Triglycerides 70 <150 mg/dL   HDL 59 >40 mg/dL   Total CHOL/HDL Ratio 3.5 RATIO   VLDL 14 0 - 40 mg/dL   LDL Cholesterol 134 (H) 0 - 99 mg/dL  LDL cholesterol, direct  Result Value Ref Range   Direct LDL 142.3 (H) 0 - 99 mg/dL      Assessment & Plan:   Problem List Items Addressed This Visit       Other   Lab test positive for detection of COVID-19 virus    Acute with symptoms starting on the 25th, exactly 5 days ago.  Tested positive at home yesterday.  At this time discussed at length current treatment options as she is on the cusp for oral and could obtain IV.  She would like oral, will send in Carmen -- educated her on this.  Will send to [facility] and if they do not have will send to [place].  Recommend she self quarantine for 10 days and utilize OTC medications for symptom relief.  She is scheduled to return to office in upcoming days.  Will f/u with her then.  She is aware to f/u sooner if worsening  symptoms or go to ER.          I discussed the assessment and treatment plan with  the patient. The patient was provided an opportunity to ask questions and all were answered. The patient agreed with the plan and demonstrated an understanding of the instructions.   The patient was advised to call back or seek an in-person evaluation if the symptoms worsen or if the condition fails to improve as anticipated.   I provided 21+ minutes of time during this encounter.   Follow up plan: Return for as scheduled.

## 2021-04-12 ENCOUNTER — Ambulatory Visit: Payer: Medicare Other

## 2021-04-19 ENCOUNTER — Ambulatory Visit (INDEPENDENT_AMBULATORY_CARE_PROVIDER_SITE_OTHER): Payer: Medicare Other

## 2021-04-19 VITALS — Ht 69.0 in | Wt 213.0 lb

## 2021-04-19 DIAGNOSIS — Z0000 Encounter for general adult medical examination without abnormal findings: Secondary | ICD-10-CM | POA: Diagnosis not present

## 2021-04-19 NOTE — Progress Notes (Signed)
I connected with Sharon [name] today by telephone and verified that I am speaking with the correct person using two identifiers. Location patient: home Location provider: work Persons participating in the virtual visit: Hailynn Slovacek, Glenna Durand LPN.   I discussed the limitations, risks, security and privacy concerns of performing an evaluation and management service by telephone and the availability of in person appointments. I also discussed with the patient that there may be a patient responsible charge related to this service. The patient expressed understanding and verbally consented to this telephonic visit.    Interactive audio and video telecommunications were attempted between this provider and patient, however failed, due to patient having technical difficulties OR patient did not have access to video capability.  We continued and completed visit with audio only.     Vital signs may be patient reported or missing.  Subjective:   Sharon [name] is a 74 y.o. female who presents for Medicare Annual (Subsequent) preventive examination.  Review of Systems     Cardiac Risk Factors include: advanced age (>19mn, >>59women);diabetes mellitus;dyslipidemia;obesity (BMI >30kg/m2);sedentary lifestyle     Objective:    Today's Vitals   04/19/21 1256  Weight: 213 lb (96.6 kg)  Height: _0  (1.753 m)   Body mass index is 31.45 kg/m.  Advanced Directives 04/19/2021 03/14/2021 06/17/2020 04/09/2020 07/13/2019 05/28/2019 04/07/2019  Does Patient Have a Medical Advance Directive? _1  No No  Does patient want to make changes to medical advance directive? - - - - - - -  Would patient like information on creating a medical advance directive? - No - Patient declined - - No - Patient declined No - Patient declined -    Current Medications (verified) Outpatient Encounter Medications as of 04/19/2021  Medication Sig   acetaminophen (TYLENOL) 500 MG tablet Take 500 mg by mouth every 6 (six)  hours as needed.   Blood Glucose Monitoring Suppl (ONETOUCH VERIO) w/Device KIT Utilize to check blood sugar twice a day, fasting in morning with goal < 130 and then 2 hours after a meal with goal <180.  Document and bring to visits.   budesonide-formoterol (SYMBICORT) 160-4.5 MCG/ACT inhaler INHALE 2 PUFFS BY MOUTH TWICE DAILY   Cholecalciferol 1.25 MG (50000 UT) TABS Take 1 tablet by mouth once a week.   citalopram (CELEXA) 20 MG tablet Take 20 mg by mouth as needed.   clopidogrel (PLAVIX) 75 MG tablet TAKE 1 TABLET(75 MG) BY MOUTH DAILY   Cyanocobalamin 1000 MCG/ML KIT Inject 1,000 mcg as directed every 30 (thirty) days.   furosemide (LASIX) 20 MG tablet Take 0.5 tablets (10 mg total) by mouth daily.   glucose blood (ONETOUCH VERIO) test strip Utilize to check blood sugar twice a day, fasting in morning with goal < 130 and then 2 hours after a meal with goal <180.  Document and bring to visits.   Lancets (ONETOUCH ULTRASOFT) lancets Utilize to check blood sugar twice a day, fasting in morning with goal < 130 and then 2 hours after a meal with goal <180.  Document and bring to visits.   metFORMIN (GLUCOPHAGE) 500 MG tablet Take 1 tablet (500 mg total) by mouth daily with breakfast.   mometasone (ELOCON) 0.1 % cream Apply to lower legs twice daily until rash improved. Avoid face, groin, underarms.   pantoprazole (PROTONIX) 40 MG tablet TAKE 1 TABLET(40 MG) BY MOUTH DAILY   penicillin v potassium (VEETID) 250 MG tablet TAKE 1 TABLET(250 MG) BY MOUTH TWICE  DAILY   PROAIR HFA 108 (90 Base) MCG/ACT inhaler INHALE 2 PUFFS INTO THE LUNGS EVERY 6 HOURS AS NEEDED FOR WHEEZING OR SHORTNESS OF BREATH   tacrolimus (PROTOPIC) 0.1 % ointment Apply to lower legs 1-2 times a day until rash improved.   Alirocumab (PRALUENT) 150 MG/ML SOAJ Inject 1 mL into the skin every 14 (fourteen) days. (Patient not taking: No sig reported)   No facility-administered encounter medications on file as of 04/19/2021.     Allergies (verified) Statins, Quinolones, Baclofen, Bactrim [sulfamethoxazole-trimethoprim], Ibuprofen, Latex, Librium [chlordiazepoxide], Naprosyn [naproxen], and Other   History: Past Medical History:  Diagnosis Date   Anxiety    Arthritis    hands, upper back   Asthma    COPD (chronic obstructive pulmonary disease) ([place])    History of cervical cancer    Menopausal disorder    Osteoporosis    Pneumonia 1960   Spasm of abdominal muscles of right side    intermittent   Stroke ([place]) 2019   TMJ (dislocation of temporomandibular joint)    Wears dentures    partial lower   Past Surgical History:  Procedure Laterality Date   ABDOMINAL HYSTERECTOMY  1970's   bladder botox  2005   BLADDER SUSPENSION  2004   CATARACT EXTRACTION W/PHACO Right 03/09/2018   Procedure: CATARACT EXTRACTION PHACO AND INTRAOCULAR LENS PLACEMENT ([place]) right;  Surgeon: Eulogio Bear, MD;  Location: [place];  Service: Ophthalmology;  Laterality: Right;  CALL CELL 1ST   CATARACT EXTRACTION W/PHACO Left 04/19/2018   Procedure: CATARACT EXTRACTION PHACO AND INTRAOCULAR LENS PLACEMENT ([facility])  LEFT;  Surgeon: Eulogio Bear, MD;  Location: [place];  Service: Ophthalmology;  Laterality: Left;   COLONOSCOPY WITH PROPOFOL N/A 12/13/2015   Procedure: COLONOSCOPY WITH PROPOFOL;  Surgeon: Lucilla Lame, MD;  Location: [place];  Service: Endoscopy;  Laterality: N/A;   COLONOSCOPY WITH PROPOFOL N/A 03/14/2021   Procedure: COLONOSCOPY WITH PROPOFOL;  Surgeon: Lucilla Lame, MD;  Location: [place];  Service: Endoscopy;  Laterality: N/A;  diabetic   LOOP RECORDER INSERTION N/A 01/07/2018   Procedure: LOOP RECORDER INSERTION;  Surgeon: Deboraha Sprang, MD;  Location: [place] [facility];  Service: Cardiovascular;  Laterality: N/A;   POLYPECTOMY N/A 12/13/2015   Procedure: POLYPECTOMY;  Surgeon: Lucilla Lame, MD;  Location: [place];  Service: Endoscopy;  Laterality: N/A;   SIGMOID COLON POLYPS X  5   POLYPECTOMY N/A 03/14/2021   Procedure: POLYPECTOMY;  Surgeon: Lucilla Lame, MD;  Location: [place];  Service: Endoscopy;  Laterality: N/A;   SHOULDER ARTHROSCOPY W/ ROTATOR CUFF REPAIR Right 1998   TEE WITHOUT CARDIOVERSION N/A 01/06/2018   Procedure: TRANSESOPHAGEAL ECHOCARDIOGRAM (TEE);  Surgeon: Minna Merritts, MD;  Location: [facility];  Service: Cardiovascular;  Laterality: N/A;   TONSILLECTOMY AND ADENOIDECTOMY     Family History  Problem Relation Age of Onset   Diabetes Mother    Heart disease Mother    Stroke Mother    Stroke Maternal Grandmother    Social History   Socioeconomic History   Marital status: Married    Spouse name: Devita Nies   Number of children: 1   Years of education: Not on file   Highest education level: Some college, no degree  Occupational History   Occupation: retired  Tobacco Use   Smoking status: Former    Years: 56.00    Pack years: 0.00    Types: Cigarettes    Quit date: 07/14/2016  Years since quitting: 4.7   Smokeless tobacco: Never  Vaping Use   Vaping Use: Former  Substance and Sexual Activity   Alcohol use: Yes    Alcohol/week: 0.0 standard drinks    Comment: occassional   Drug use: No   Sexual activity: Not Currently    Birth control/protection: Post-menopausal  Other Topics Concern   Not on file  Social History Narrative   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, manages farm   Social Determinants of Health   Financial Resource Strain: Low Risk    Difficulty of Paying Living Expenses: Not hard at all  Food Insecurity: No Food Insecurity   Worried About Charity fundraiser in the Last Year: Never true   Arboriculturist in the Last Year: Never true  Transportation Needs: No Transportation Needs   Lack of Transportation (Medical): No   Lack of Transportation (Non-Medical): No  Physical Activity: Inactive   Days of Exercise per Week: 0 days   Minutes of Exercise per Session: 0 min  Stress: Stress  Concern Present   Feeling of Stress : Rather much  Social Connections: Not on file    Tobacco Counseling Counseling given: Not Answered   Clinical Intake:  Pre-visit preparation completed: Yes  Pain : No/denies pain     Nutritional Status: BMI > 30  Obese Nutritional Risks: None Diabetes: Yes  How often do you need to have someone help you when you read instructions, pamphlets, or other written materials from your doctor or pharmacy?: 1 - Never What is the last grade level you completed in school?: some college  Diabetic? Yes Nutrition Risk Assessment:  Has the patient had any N/V/D within the last 2 months?  No  Does the patient have any non-healing wounds?  No  Has the patient had any unintentional weight loss or weight gain?  No   Diabetes:  Is the patient diabetic?  Yes  If diabetic, was a CBG obtained today?  No  Did the patient bring in their glucometer from home?  No  How often do you monitor your CBG's? daily.   Financial Strains and Diabetes Management:  Are you having any financial strains with the device, your supplies or your medication? No .  Does the patient want to be seen by Chronic Care Management for management of their diabetes?  No  Would the patient like to be referred to a Nutritionist or for Diabetic Management?  No   Diabetic Exams:  Diabetic Eye Exam: Completed 03/06/2021 Diabetic Foot Exam: Completed 01/15/2021   Interpreter Needed?: No  Information entered by :: NAllen LPN   Activities of Daily Living In your present state of health, do you have any difficulty performing the following activities: 04/19/2021 03/14/2021  Hearing? Y N  Vision? Y N  Comment sometimes blurry -  Difficulty concentrating or making decisions? N N  Walking or climbing stairs? Y N  Dressing or bathing? N N  Doing errands, shopping? N -  Preparing Food and eating ? N -  Using the Toilet? N -  In the past six months, have you accidently leaked urine? Y -   Comment wears pads -  Do you have problems with loss of bowel control? N -  Managing your Medications? N -  Managing your Finances? N -  Housekeeping or managing your Housekeeping? N -  Some recent data might be hidden    Patient Care Team: [name], NP as PCP - General (Nurse Practitioner) Minna Merritts,  MD as PCP - Cardiology (Cardiology) Deboraha Sprang, MD as PCP - Electrophysiology (Cardiology) Bary Castilla Forest Gleason, MD as Consulting Physician (General Surgery) Kathrine Haddock, NP as Nurse Practitioner (Nurse Practitioner) Minna Merritts, MD as Consulting Physician (Cardiology) Greg Cutter, LCSW as Social Worker (Licensed Clinical Social Worker) Vladimir Faster, [facility] as Pharmacist (Pharmacist)  Indicate any recent Medical Services you may have received from other than [facility] providers in the past year (date may be approximate).     Assessment:   This is a routine wellness examination for [facility].  Hearing/Vision screen Vision Screening - Comments:: Regular eye exams, [facility]  Dietary issues and exercise activities discussed: Current Exercise Habits: The patient does not participate in regular exercise at present   Goals Addressed             This Visit's Progress    Patient Stated       04/19/2021, wants to feel better        Depression Screen PHQ 2/9 Scores 04/19/2021 07/17/2020 04/09/2020 06/28/2019 04/07/2019 12/21/2018 12/07/2018  PHQ - 2 Score 0 0 0 2 1 0 0  PHQ- 9 Score - 2 - 3 - 0 0    Fall Risk Fall Risk  04/19/2021 07/17/2020 04/09/2020 06/28/2019 04/07/2019  Falls in the past year? 0 0 0 1 1  Number falls in past yr: - 0 - 0 0  Injury with Fall? - 0 - 0 1  Risk for fall due to : Medication side effect;Impaired balance/gait No Fall Risks Impaired mobility;Medication side effect History of fall(s) Impaired balance/gait  Follow up Falls evaluation completed;Education provided;Falls prevention discussed Falls evaluation completed Falls  evaluation completed;Education provided;Falls prevention discussed Falls prevention discussed;Education provided -    FALL RISK PREVENTION PERTAINING TO THE HOME:  Any stairs in or around the home? Yes  If so, are there any without handrails? No  Home free of loose throw rugs in walkways, pet beds, electrical cords, etc? Yes  Adequate lighting in your home to reduce risk of falls? Yes   ASSISTIVE DEVICES UTILIZED TO PREVENT FALLS:  Life alert? No  Use of a cane, walker or w/c? No  Grab bars in the bathroom? Yes  Shower chair or bench in shower? Yes  Elevated toilet seat or a handicapped toilet? Yes   TIMED UP AND GO:  Was the test performed? No .       Cognitive Function:     6CIT Screen 04/19/2021 04/09/2020 01/14/2018  What Year? 0 points 0 points 0 points  What month? 0 points 0 points 0 points  What time? 0 points 0 points 0 points  Count back from 20 0 points 0 points 0 points  Months in reverse 0 points 0 points 0 points  Repeat phrase 0 points 0 points 0 points  Total Score 0 0 0    Immunizations Immunization History  Administered Date(s) Administered   Fluad Quad(high Dose 65+) 07/21/2019, 07/17/2020   Influenza, High Dose Seasonal PF 06/17/2018   Influenza-Unspecified 08/09/2015, 06/18/2016, 07/17/2017   PFIZER(Purple Top)SARS-COV-2 Vaccination 11/27/2019, 12/27/2019, 08/06/2020   Pneumococcal Conjugate-13 09/25/2014   Pneumococcal-Unspecified 04/07/2012   Td 09/25/2014    TDAP status: Up to date  Flu Vaccine status: Up to date  Pneumococcal vaccine status: Up to date  Covid-19 vaccine status: Completed vaccines  Qualifies for Shingles Vaccine? Yes   Zostavax completed No   Shingrix Completed?: No.    Education has been provided regarding the importance of this vaccine.  Patient has been advised to call insurance company to determine out of pocket expense if they have not yet received this vaccine. Advised may also receive vaccine at local pharmacy or  Health Dept. Verbalized acceptance and understanding.  Screening Tests Health Maintenance  Topic Date Due   Zoster Vaccines- Shingrix (1 of 2) Never done   COVID-19 Vaccine (4 - Booster for Pfizer series) 12/07/2020   INFLUENZA VACCINE  05/13/2021   HEMOGLOBIN A1C  07/17/2021   FOOT EXAM  01/15/2022   URINE MICROALBUMIN  01/15/2022   OPHTHALMOLOGY EXAM  03/06/2022   Fecal DNA (Cologuard)  01/31/2024   TETANUS/TDAP  09/25/2024   DEXA SCAN  Completed   Hepatitis C Screening  Completed   PNA vac Low Risk Adult  Completed   HPV VACCINES  Aged Out    Health Maintenance  Health Maintenance Due  Topic Date Due   Zoster Vaccines- Shingrix (1 of 2) Never done   COVID-19 Vaccine (4 - Booster for Pfizer series) 12/07/2020    Colorectal cancer screening: No longer required.   Mammogram status: No longer required due to age.  Bone Density status: Completed 08/05/2017.  Lung Cancer Screening: (Low Dose CT Chest recommended if Age 22-80 years, 30 pack-year currently smoking OR have quit w/in 15years.) does not qualify.   Lung Cancer Screening Referral: no  Additional Screening:  Hepatitis C Screening: does qualify; Completed 10/17/2015  Vision Screening: Recommended annual ophthalmology exams for early detection of glaucoma and other disorders of the eye. Is the patient up to date with their annual eye exam?  Yes  Who is the provider or what is the name of the office in which the patient attends annual eye exams? [facility] If pt is not established with a provider, would they like to be referred to a provider to establish care? No .   Dental Screening: Recommended annual dental exams for proper oral hygiene  Community Resource Referral / Chronic Care Management: CRR required this visit?  No   CCM required this visit?  No      Plan:     I have personally reviewed and noted the following in the patient's chart:   Medical and social history Use of alcohol, tobacco  or illicit drugs  Current medications and supplements including opioid prescriptions.  Functional ability and status Nutritional status Physical activity Advanced directives List of other physicians Hospitalizations, surgeries, and ER visits in previous 12 months Vitals Screenings to include cognitive, depression, and falls Referrals and appointments  In addition, I have reviewed and discussed with patient certain preventive protocols, quality metrics, and best practice recommendations. A written personalized care plan for preventive services as well as general preventive health recommendations were provided to patient.     Kellie Simmering, LPN   12/19/1827   Nurse Notes:

## 2021-04-19 NOTE — Patient Instructions (Signed)
Sharon [name] , Thank you for taking time to come for your Medicare Wellness Visit. I appreciate your ongoing commitment to your health goals. Please review the following plan we discussed and let me know if I can assist you in the future.   Screening recommendations/referrals: Colonoscopy: not required Mammogram: not required Bone Density: completed 08/05/2017 Recommended yearly ophthalmology/optometry visit for glaucoma screening and checkup Recommended yearly dental visit for hygiene and checkup  Vaccinations: Influenza vaccine: completed 07/17/2020, due 05/13/2021 Pneumococcal vaccine: completed 09/25/2014 Tdap vaccine: completed 09/25/2014, due 09/25/2024 Shingles vaccine: discussed   Covid-19: 08/06/2020, 12/27/2019, 11/27/2019  Advanced directives: Advance directive discussed with you today.   Conditions/risks identified: none  Next appointment: Follow up in one year for your annual wellness visit    Preventive Care 65 Years and Older, Female Preventive care refers to lifestyle choices and visits with your health care provider that can promote health and wellness. What does preventive care include? A yearly physical exam. This is also called an annual well check. Dental exams once or twice a year. Routine eye exams. Ask your health care provider how often you should have your eyes checked. Personal lifestyle choices, including: Daily care of your teeth and gums. Regular physical activity. Eating a healthy diet. Avoiding tobacco and drug use. Limiting alcohol use. Practicing safe sex. Taking low-dose aspirin every day. Taking vitamin and mineral supplements as recommended by your health care provider. What happens during an annual well check? The services and screenings done by your health care provider during your annual well check will depend on your age, overall health, lifestyle risk factors, and family history of disease. Counseling  Your health care provider may ask you  questions about your: Alcohol use. Tobacco use. Drug use. Emotional well-being. Home and relationship well-being. Sexual activity. Eating habits. History of falls. Memory and ability to understand (cognition). Work and work Statistician. Reproductive health. Screening  You may have the following tests or measurements: Height, weight, and BMI. Blood pressure. Lipid and cholesterol levels. These may be checked every 5 years, or more frequently if you are over 4 years old. Skin check. Lung cancer screening. You may have this screening every year starting at age 20 if you have a 30-pack-year history of smoking and currently smoke or have quit within the past 15 years. Fecal occult blood test (FOBT) of the stool. You may have this test every year starting at age 30. Flexible sigmoidoscopy or colonoscopy. You may have a sigmoidoscopy every 5 years or a colonoscopy every 10 years starting at age 74. Hepatitis C blood test. Hepatitis B blood test. Sexually transmitted disease (STD) testing. Diabetes screening. This is done by checking your blood sugar (glucose) after you have not eaten for a while (fasting). You may have this done every 1-3 years. Bone density scan. This is done to screen for osteoporosis. You may have this done starting at age 90. Mammogram. This may be done every 1-2 years. Talk to your health care provider about how often you should have regular mammograms. Talk with your health care provider about your test results, treatment options, and if necessary, the need for more tests. Vaccines  Your health care provider may recommend certain vaccines, such as: Influenza vaccine. This is recommended every year. Tetanus, diphtheria, and acellular pertussis (Tdap, Td) vaccine. You may need a Td booster every 10 years. Zoster vaccine. You may need this after age 31. Pneumococcal 13-valent conjugate (PCV13) vaccine. One dose is recommended after age 80. Pneumococcal polysaccharide  (PPSV23)  vaccine. One dose is recommended after age 25. Talk to your health care provider about which screenings and vaccines you need and how often you need them. This information is not intended to replace advice given to you by your health care provider. Make sure you discuss any questions you have with your health care provider. Document Released: 10/26/2015 Document Revised: 06/18/2016 Document Reviewed: 07/31/2015 Elsevier Interactive Patient Education  2017 [employer] Prevention in the Home Falls can cause injuries. They can happen to people of all ages. There are many things you can do to make your home safe and to help prevent falls. What can I do on the outside of my home? Regularly fix the edges of walkways and driveways and fix any cracks. Remove anything that might make you trip as you walk through a door, such as a raised step or threshold. Trim any bushes or trees on the path to your home. Use bright outdoor lighting. Clear any walking paths of anything that might make someone trip, such as rocks or tools. Regularly check to see if handrails are loose or broken. Make sure that both sides of any steps have handrails. Any raised decks and porches should have guardrails on the edges. Have any leaves, snow, or ice cleared regularly. Use sand or salt on walking paths during winter. Clean up any spills in your garage right away. This includes oil or grease spills. What can I do in the bathroom? Use night lights. Install grab bars by the toilet and in the tub and shower. Do not use towel bars as grab bars. Use non-skid mats or decals in the tub or shower. If you need to sit down in the shower, use a plastic, non-slip stool. Keep the floor dry. Clean up any water that spills on the floor as soon as it happens. Remove soap buildup in the tub or shower regularly. Attach bath mats securely with double-sided non-slip rug tape. Do not have throw rugs and other things on the  floor that can make you trip. What can I do in the bedroom? Use night lights. Make sure that you have a light by your bed that is easy to reach. Do not use any sheets or blankets that are too big for your bed. They should not hang down onto the floor. Have a firm chair that has side arms. You can use this for support while you get dressed. Do not have throw rugs and other things on the floor that can make you trip. What can I do in the kitchen? Clean up any spills right away. Avoid walking on wet floors. Keep items that you use a lot in easy-to-reach places. If you need to reach something above you, use a strong step stool that has a grab bar. Keep electrical cords out of the way. Do not use floor polish or wax that makes floors slippery. If you must use wax, use non-skid floor wax. Do not have throw rugs and other things on the floor that can make you trip. What can I do with my stairs? Do not leave any items on the stairs. Make sure that there are handrails on both sides of the stairs and use them. Fix handrails that are broken or loose. Make sure that handrails are as long as the stairways. Check any carpeting to make sure that it is firmly attached to the stairs. Fix any carpet that is loose or worn. Avoid having throw rugs at the top or bottom of  the stairs. If you do have throw rugs, attach them to the floor with carpet tape. Make sure that you have a light switch at the top of the stairs and the bottom of the stairs. If you do not have them, ask someone to add them for you. What else can I do to help prevent falls? Wear shoes that: Do not have high heels. Have rubber bottoms. Are comfortable and fit you well. Are closed at the toe. Do not wear sandals. If you use a stepladder: Make sure that it is fully opened. Do not climb a closed stepladder. Make sure that both sides of the stepladder are locked into place. Ask someone to hold it for you, if possible. Clearly mark and make  sure that you can see: Any grab bars or handrails. First and last steps. Where the edge of each step is. Use tools that help you move around (mobility aids) if they are needed. These include: Canes. Walkers. Scooters. Crutches. Turn on the lights when you go into a dark area. Replace any light bulbs as soon as they burn out. Set up your furniture so you have a clear path. Avoid moving your furniture around. If any of your floors are uneven, fix them. If there are any pets around you, be aware of where they are. Review your medicines with your doctor. Some medicines can make you feel dizzy. This can increase your chance of falling. Ask your doctor what other things that you can do to help prevent falls. This information is not intended to replace advice given to you by your health care provider. Make sure you discuss any questions you have with your health care provider. Document Released: 07/26/2009 Document Revised: 03/06/2016 Document Reviewed: 11/03/2014 Elsevier Interactive Patient Education  2017 [employer].

## 2021-04-21 LAB — CUP PACEART REMOTE DEVICE CHECK
Date Time Interrogation Session: 20220707215038
Implantable Pulse Generator Implant Date: 20190328

## 2021-04-22 ENCOUNTER — Ambulatory Visit (INDEPENDENT_AMBULATORY_CARE_PROVIDER_SITE_OTHER): Payer: Medicare Other

## 2021-04-22 DIAGNOSIS — Z8673 Personal history of transient ischemic attack (TIA), and cerebral infarction without residual deficits: Secondary | ICD-10-CM | POA: Diagnosis not present

## 2021-04-23 ENCOUNTER — Other Ambulatory Visit: Payer: Self-pay

## 2021-04-23 ENCOUNTER — Ambulatory Visit (INDEPENDENT_AMBULATORY_CARE_PROVIDER_SITE_OTHER): Payer: Medicare Other

## 2021-04-23 DIAGNOSIS — E538 Deficiency of other specified B group vitamins: Secondary | ICD-10-CM

## 2021-04-23 MED ORDER — CYANOCOBALAMIN 1000 MCG/ML IJ SOLN
1000.0000 ug | Freq: Once | INTRAMUSCULAR | Status: AC
Start: 2021-04-23 — End: 2021-04-23
  Administered 2021-04-23: 1000 ug via INTRAMUSCULAR

## 2021-04-26 ENCOUNTER — Telehealth: Payer: Self-pay

## 2021-04-26 NOTE — Chronic Care Management (AMB) (Signed)
  Care Management   Note  04/26/2021 Name: Sharon [name] MRN: [id number] DOB: 11/04/1945  Sharon [name] is a 75 y.o. year old female who is a primary care patient of [name], NP and is actively engaged with the care management team. I reached out to Sharon [name] by phone today to assist with re-scheduling a follow up visit with the Pharmacist  Follow up plan: Unsuccessful telephone outreach attempt made. A HIPAA compliant phone message was left for the patient providing contact information and requesting a return call.  The care management team will reach out to the patient again over the next 7 days.  If patient returns call to provider office, please advise to call [facility]  [facility] [address], [place], [place],  [address] Direct Dial: [phone] [email or web address]: [email or web address]

## 2021-04-29 ENCOUNTER — Other Ambulatory Visit: Payer: Self-pay | Admitting: Nurse Practitioner

## 2021-04-29 MED ORDER — CHOLECALCIFEROL 1.25 MG (50000 UT) PO TABS: 1.0000 | ORAL_TABLET | ORAL | 4 refills | Status: DC

## 2021-04-29 NOTE — Chronic Care Management (AMB) (Signed)
  Care Management   Note  04/29/2021 Name: TAUNIA FRASCO MRN: [id number] DOB: June 11, 1946  [name] is a 75 y.o. year old female who is a primary care patient of [name], NP and is actively engaged with the care management team. I reached out to [name] by phone today to assist with re-scheduling a follow up visit with the Pharmacist  Follow up plan: Telephone appointment with care management team member scheduled for:05/06/2021  Noreene Larsson, [address], [place], [place] [address] Direct Dial: [phone] [email or web address] Website: [email or web address]

## 2021-05-01 ENCOUNTER — Other Ambulatory Visit: Payer: Self-pay | Admitting: Nurse Practitioner

## 2021-05-01 MED ORDER — BUDESONIDE-FORMOTEROL FUMARATE 160-4.5 MCG/ACT IN AERO: INHALATION_SPRAY | RESPIRATORY_TRACT | 6 refills | Status: DC

## 2021-05-06 ENCOUNTER — Telehealth: Payer: Self-pay

## 2021-05-06 ENCOUNTER — Ambulatory Visit (INDEPENDENT_AMBULATORY_CARE_PROVIDER_SITE_OTHER): Payer: Medicare Other

## 2021-05-06 ENCOUNTER — Other Ambulatory Visit: Payer: Self-pay | Admitting: Dermatology

## 2021-05-06 DIAGNOSIS — T466X5A Adverse effect of antihyperlipidemic and antiarteriosclerotic drugs, initial encounter: Secondary | ICD-10-CM

## 2021-05-06 DIAGNOSIS — E669 Obesity, unspecified: Secondary | ICD-10-CM

## 2021-05-06 DIAGNOSIS — I872 Venous insufficiency (chronic) (peripheral): Secondary | ICD-10-CM

## 2021-05-06 DIAGNOSIS — E1169 Type 2 diabetes mellitus with other specified complication: Secondary | ICD-10-CM

## 2021-05-06 DIAGNOSIS — E785 Hyperlipidemia, unspecified: Secondary | ICD-10-CM | POA: Diagnosis not present

## 2021-05-06 DIAGNOSIS — M791 Myalgia, unspecified site: Secondary | ICD-10-CM

## 2021-05-06 NOTE — Patient Instructions (Signed)
Sharon [name],  Thank you for talking with me today. I have included our care plan/goals in the following pages.   Please review and call me at [phone] with any questions.  Thanks! Ellin Mayhew, Pharm.D., BCGP Clinical Pharmacist [facility] [place] [phone] Patient Care Plan: [place] Plan     Problem Identified: HLD T2DM CVA Hx GERD PVD Osteopenia   Priority: High     Long-Range Goal: Patient-Specific Goal   Start Date: 05/06/2021  Expected End Date: 05/06/2022  This Visit's Progress: On track  Priority: High  Note:    Current Barriers:  None at this time.  Pharmacist Clinical Goal(s):  Patient will contact provider office for questions/concerns as evidenced notation of same in electronic health record through collaboration with PharmD and provider.   Interventions: 1:1 collaboration with [name], NP regarding development and update of comprehensive plan of care as evidenced by provider attestation and co-signature Inter-disciplinary care team collaboration (see longitudinal plan of care) Comprehensive medication review performed; medication list updated in electronic medical record  Hyperlipidemia: (LDL goal < 70) -Uncontrolled. Not at goal, will need updated lab.  -Current treatment: Repatha 140 mg every 14 days -Medications previously tried: rosuvastatin (did not tolerate)  -Educated on Cholesterol goals;  Benefits of statin for ASCVD risk reduction; -Recommended to continue current medication  Diabetes (A1c goal <7%) -Controlled -Current medications: Metformin 500 mg once daily with breakfast -Medications previously tried: n/a  -Current home glucose readings fasting glucose: 130s or less post prandial glucose: 130s after meals -Denies hypoglycemic/hyperglycemic symptoms -Current meal patterns:  breakfast: oatmeal or cheerios  lunch: fruits salad, grilled chicken  dinner: fish or  pork chop. No fried foods. No potatoes  drinks: no routine alcohol use.  -Educated on A1c and blood sugar goals; -Exercise: some chair exercises, no routine exercise -Recommended to continue current medication  Patient Goals/Self-Care Activities Patient will:  - take medications as prescribed  Follow Up Plan: DM call 1 month, rph f/u 6 months  Medication Assistance: None required.  Patient affirms current coverage meets needs.     The patient verbalized understanding of instructions provided today and agreed to receive a MyChart copy of patient instruction and/or educational materials. Telephone follow up appointment with pharmacy team member scheduled for: See next appointment with "Care Management Staff" under "What's Next" below.

## 2021-05-06 NOTE — Progress Notes (Signed)
Chronic Care Management Pharmacy Note  05/06/2021 Name:  Sharon [name] MRN:  [id number] DOB:  11/20/1945  Recommendations/Changes made from today's visit: No Rx changes.   Subjective: Sharon [name] is an 75 y.o. year old female who is a primary patient of [name], [name], NP.  The CCM team was consulted for assistance with disease management and care coordination needs.    Engaged with patient by telephone for follow up visit in response to provider referral for pharmacy case management and/or care coordination services.   Consent to Services:  The patient was given information about Chronic Care Management services, agreed to services, and gave verbal consent prior to initiation of services.  Please see initial visit note for detailed documentation.   Patient Care Team: [name], NP as PCP - General (Nurse Practitioner) Minna Merritts, MD as PCP - Cardiology (Cardiology) Deboraha Sprang, MD as PCP - Electrophysiology (Cardiology) Bary Castilla Forest Gleason, MD as Consulting Physician (General Surgery) Kathrine Haddock, NP as Nurse Practitioner (Nurse Practitioner) Minna Merritts, MD as Consulting Physician (Cardiology) Greg Cutter, LCSW as Social Worker (Licensed Clinical Social Worker) Vladimir Faster, [facility] as Pharmacist (Pharmacist)  Objective:  Lab Results  Component Value Date   CREATININE 0.92 01/15/2021   CREATININE 0.97 07/17/2020   CREATININE 0.99 02/14/2020    Lab Results  Component Value Date   HGBA1C 6.4 (H) 01/15/2021   Last diabetic Eye exam:  Lab Results  Component Value Date/Time   HMDIABEYEEXA No Retinopathy 03/06/2021 12:00 AM    Last diabetic Foot exam: No results found for: HMDIABFOOTEX      Component Value Date/Time   CHOL 207 (H) 03/21/2021 1020   CHOL 223 (H) 01/15/2021 1429   TRIG 70 03/21/2021 1020   HDL 59 03/21/2021 1020   HDL 60 01/15/2021 1429   CHOLHDL 3.5 03/21/2021 1020   VLDL 14 03/21/2021 1020   LDLCALC 134 (H)  03/21/2021 1020   LDLCALC 137 (H) 01/15/2021 1429   LDLDIRECT 142.3 (H) 03/21/2021 1024   Hepatic Function Latest Ref Rng & Units 01/15/2021 07/17/2020 02/14/2020  Total Protein 6.0 - 8.5 g/dL 6.7 6.4 6.6  Albumin 3.7 - 4.7 g/dL 4.4 4.4 4.5  AST 0 - 40 IU/L 20 20 22   ALT 0 - 32 IU/L 20 23 21   Alk Phosphatase 44 - 121 IU/L 74 74 91  Total Bilirubin 0.0 - 1.2 mg/dL 0.3 0.4 0.4    Lab Results  Component Value Date/Time   TSH 1.120 01/15/2021 02:29 PM   TSH 1.260 07/17/2020 03:09 PM    CBC Latest Ref Rng & Units 07/17/2020 01/26/2020 06/28/2019  WBC 3.4 - 10.8 x10E3/uL 7.2 9.0 7.7  Hemoglobin 11.1 - 15.9 g/dL 14.4 15.0 14.4  Hematocrit 34.0 - 46.6 % 43.6 43.6 44.6  Platelets 150 - 450 x10E3/uL 201 201 250    Lab Results  Component Value Date/Time   VD25OH 73.1 01/15/2021 02:29 PM   VD25OH 70.0 09/17/2020 01:09 PM    Clinical ASCVD: Yes  The ASCVD Risk score Mikey Bussing DC Jr., et al., 2013) failed to calculate for the following reasons:   The patient has a prior MI or stroke diagnosis    Social History   Tobacco Use  Smoking Status Former   Years: 56.00   Types: Cigarettes   Quit date: 07/14/2016   Years since quitting: 4.8  Smokeless Tobacco Never   BP Readings from Last 3 Encounters:  03/14/21 (!) 117/55  02/26/21 134/76  02/22/21 130/70   Pulse Readings from Last 3 Encounters:  03/14/21 73  02/26/21 79  02/22/21 86   Wt Readings from Last 3 Encounters:  04/19/21 213 lb (96.6 kg)  03/14/21 213 lb (96.6 kg)  02/26/21 214 lb 12.8 oz (97.4 kg)    Assessment: Review of patient past medical history, allergies, medications, health status, including review of consultants reports, laboratory and other test data, was performed as part of comprehensive evaluation and provision of chronic care management services.   SDOH:  (Social Determinants of Health) assessments and interventions performed: Yes   CCM Care Plan  Allergies  Allergen Reactions   Statins Other (See  Comments)    myalgia   Quinolones     FLUOROQUINOLONES - Pt reports she was told to NEVER take these.   Baclofen Swelling   Bactrim [Sulfamethoxazole-Trimethoprim] Nausea And Vomiting   Ibuprofen Rash    Mouth swelling   Latex Rash    Some bandaids, some gloves; BLOOD TEST NEGATIVE   Librium [Chlordiazepoxide] Itching    Dizziness    Naprosyn [Naproxen] Rash    Mouth swelling   Other Rash    [place] nuts - mouth swelling    Medications Reviewed Today     Reviewed by Madelin Rear, [facility] (Pharmacist) on 05/06/21 at 1441  Med List Status: <None>   Medication Order Taking? Sig Documenting Provider Last Dose Status Informant  acetaminophen (TYLENOL) 500 MG tablet [id number] No Take 500 mg by mouth every 6 (six) hours as needed. [provider] Taking Active   Alirocumab (PRALUENT) 150 MG/ML SOAJ [id number] No Inject 1 mL into the skin every 14 (fourteen) days.  Patient not taking: No sig reported   Gollan, Kathlene November, MD Not Taking Active   Blood Glucose Monitoring Suppl (ONETOUCH VERIO) w/Device KIT [id number] No Utilize to check blood sugar twice a day, fasting in morning with goal < 130 and then 2 hours after a meal with goal <180.  Document and bring to visits. [name] T, NP Taking Active   budesonide-formoterol [facility]) 160-4.5 MCG/ACT inhaler [id number]  INHALE 2 PUFFS BY MOUTH TWICE DAILY [name], Jolene T, NP  Active   Cholecalciferol 1.25 MG (50000 UT) TABS [id number]  Take 1 tablet by mouth once a week. [name] T, NP  Active   citalopram (CELEXA) 20 MG tablet [id number] No Take 20 mg by mouth as needed. [provider] Taking Active   clopidogrel (PLAVIX) 75 MG tablet [id number] No TAKE 1 TABLET(75 MG) BY MOUTH DAILY [name], Jolene T, NP Taking Active   Cyanocobalamin 1000 MCG/ML KIT [id number] No Inject 1,000 mcg as directed every 30 (thirty) days. [provider] Taking Active Self           Med Note Kellie Simmering, COURTNEY   Tue Oct 19, 2018  10:22 AM)    furosemide (LASIX) 20 MG tablet [id number] No Take 0.5 tablets (10 mg total) by mouth daily. [name] T, NP Taking Active   glucose blood (ONETOUCH VERIO) test strip [id number] No Utilize to check blood sugar twice a day, fasting in morning with goal < 130 and then 2 hours after a meal with goal <180.  Document and bring to visits. [name], NP Taking Active   Lancets [facility] ULTRASOFT) lancets [id number] No Utilize to check blood sugar twice a day, fasting in morning with goal < 130 and then 2 hours after a meal with goal <180.  Document and bring to visits. [name], NP Taking Active  metFORMIN (GLUCOPHAGE) 500 MG tablet [id number] No Take 1 tablet (500 mg total) by mouth daily with breakfast. [name] T, NP Taking Active   mometasone (ELOCON) 0.1 % cream [id number]  APPLY TO LOWER LEGS TWICE DAILY UNTIL RASH IMPROVED. AVOID FACE, Chanetta Marshall, MD  Active   pantoprazole (PROTONIX) 40 MG tablet [id number] No TAKE 1 TABLET(40 MG) BY MOUTH DAILY [name], Jolene T, NP Taking Active   penicillin v potassium (VEETID) 250 MG tablet [id number] No TAKE 1 TABLET(250 MG) BY MOUTH TWICE DAILY Kris Hartmann, NP Taking Active   PROAIR HFA 108 [phone] Base) MCG/ACT inhaler [id number] No INHALE 2 PUFFS INTO THE LUNGS EVERY 6 HOURS AS NEEDED FOR WHEEZING OR SHORTNESS OF BREATH [name], Jolene T, NP Taking Active   tacrolimus (PROTOPIC) 0.1 % ointment [id number] No Apply to lower legs 1-2 times a day until rash improved. Brendolyn Patty, MD Taking Active             Patient Active Problem List   Diagnosis Date Noted   Lab test positive for detection of COVID-19 virus 04/10/2021   Myalgia due to statin 03/18/2021   Personal history of colonic polyps    Rectal polyp    Peripheral vascular disease due to secondary diabetes ([place]) 01/11/2021   Osteopenia of neck of left femur 01/11/2021   Drug-induced myopathy 09/13/2020   Neuropathy of both feet 05/24/2020    Hyperlipidemia associated with type 2 diabetes mellitus ([place]) 02/14/2020   Obesity 02/14/2020   Vitamin D deficiency 06/28/2019   Type 2 diabetes mellitus with obesity ([place]) 06/28/2019   GERD (gastroesophageal reflux disease) 12/07/2018   Vitamin B12 deficiency 12/05/2018   Senile purpura ([place]) 07/28/2018   Lymphedema 07/14/2018   Aneurysm of descending thoracic aorta ([place]) 05/04/2018   Allergic rhinitis 02/16/2018   Aortic atherosclerosis ([place]) 01/13/2018   Chronic kidney disease, stage 3 ([place]) 01/12/2018   History of CVA (cerebrovascular accident)    TIA (transient ischemic attack) 01/04/2018   CAD (coronary artery disease) 11/25/2017   Caregiver stress 11/25/2017   Menopause 08/14/2017   Stress incontinence 06/19/2017   Personal history of tobacco use, presenting hazards to health 04/01/2017   Anxiety 03/13/2017   Advanced care planning/counseling discussion 10/17/2016   Benign neoplasm of sigmoid colon    Centrilobular emphysema ([place]) 10/17/2015   Mass of upper inner quadrant of right breast 06/22/2015    Immunization History  Administered Date(s) Administered   Fluad Quad(high Dose 65+) 07/21/2019, 07/17/2020   Influenza, High Dose Seasonal PF 06/17/2018   Influenza-Unspecified 08/09/2015, 06/18/2016, 07/17/2017   PFIZER(Purple Top)SARS-COV-2 Vaccination 11/27/2019, 12/27/2019, 08/06/2020   Pneumococcal Conjugate-13 09/25/2014   Pneumococcal-Unspecified 04/07/2012   Td 09/25/2014    Conditions to be addressed/monitored: HLD T2DM CVA Hx GERD PVD Osteopenia   Care Plan : [place]  Updates made by Madelin Rear, [place] since 05/06/2021 12:00 AM     Problem: HLD T2DM CVA Hx GERD PVD Osteopenia   Priority: High     Long-Range Goal: Disease Management   Start Date: 05/06/2021  Expected End Date: 05/06/2022  This Visit's Progress: On track  Priority: High  Note:    Current Barriers:  None at this time.  Pharmacist Clinical Goal(s):  Patient will  contact provider office for questions/concerns as evidenced notation of same in electronic health record through collaboration with PharmD and provider.   Interventions: 1:1 collaboration with [name], NP regarding development and update of comprehensive plan of care as evidenced by  provider attestation and co-signature Inter-disciplinary care team collaboration (see longitudinal plan of care) Comprehensive medication review performed; medication list updated in electronic medical record  Hyperlipidemia: (LDL goal < 70) -Uncontrolled. Not at goal, will need updated labs -Did have some concerns with flu like symptoms after previous repatha injection and was also recently tested positive for COVID-19. Due to mixed picture is willing to continue with therapy. At this time cost is not an issue for patient. -Current treatment: Repatha 140 mg every 14 days -Medications previously tried: rosuvastatin (did not tolerate)  -Educated on Cholesterol goals;  Benefits of statin for ASCVD risk reduction; -Recommended to continue current medication  Diabetes (A1c goal <7%) -Controlled -Current medications: Metformin 500 mg once daily with breakfast -Medications previously tried: n/a  -Current home glucose readings fasting glucose: 130s or less post prandial glucose: 130s after meals -Denies hypoglycemic/hyperglycemic symptoms -Current meal patterns:  breakfast: oatmeal or cheerios  lunch: fruits salad, grilled chicken  dinner: fish or pork chop. No fried foods. No potatoes  drinks: no routine alcohol use.  -Educated on A1c and blood sugar goals; -Exercise: some chair exercises, no routine exercise -Recommended to continue current medication  Patient Goals/Self-Care Activities Patient will:  - take medications as prescribed  Follow Up Plan: DM call 1 month, rph f/u 6 months  Medication Assistance: None required.  Patient affirms current coverage meets needs.    Patient's  preferred pharmacy is:  [id number], [place] [address] AT [place] [facility] [name] [address] Phone: [phone] Fax: [phone]  [place], [place] [address] [place] [address] Phone: [phone] Fax: [phone]  Follow Up:  Patient agrees to Care Plan and Follow-up.  Future Appointments  Date Time Provider [place]  05/07/2021  1:00 PM Ansel Bong, OT [facility] None  05/24/2021  1:20 PM [facility] NURSE [facility] PEC  05/27/2021 11:15 AM [facility] DEVICE REMOTES [facility]  07/01/2021 11:15 AM [facility] DEVICE REMOTES [facility]  07/15/2021  3:30 PM Brendolyn Patty, MD [facility] None  08/02/2021  1:00 PM [name] T, NP [facility] PEC  08/05/2021 11:15 AM [facility] DEVICE REMOTES [facility]  08/29/2021  8:30 AM [facility] VASC 3 [facility]-IMG None  08/29/2021  9:30 AM Kris Hartmann, NP [facility] None  09/09/2021 11:15 AM [facility] DEVICE REMOTES [facility]  04/21/2022  1:00 PM CFP NURSE HEALTH ADVISOR [facility]FP-[facility] [place], PharmD, CPP Clinical Pharmacist Practitioner  [phone]

## 2021-05-07 ENCOUNTER — Ambulatory Visit: Payer: Medicare Other | Attending: Nurse Practitioner | Admitting: Occupational Therapy

## 2021-05-07 ENCOUNTER — Other Ambulatory Visit: Payer: Self-pay

## 2021-05-07 DIAGNOSIS — I890 Lymphedema, not elsewhere classified: Secondary | ICD-10-CM | POA: Diagnosis not present

## 2021-05-07 NOTE — Therapy (Signed)
[place] MAIN [facility] SERVICES [address] [place], [name], [address] Phone: [phone]   Fax:  [phone]  Occupational Therapy Treatment  Patient Details  Name: Sharon [name] MRN: [id number] Date of Birth: 17-Aug-1946 No data recorded  Encounter Date: 05/07/2021   OT End of Session - 05/07/21 1321     Visit Number 41    Number of Visits 81    Date for OT Re-Evaluation 08/05/21    OT Start Time 0110    OT Stop Time 0205    OT Time Calculation (min) 55 min    Activity Tolerance Patient tolerated treatment well;No increased pain    Behavior During Therapy WFL for tasks assessed/performed             Past Medical History:  Diagnosis Date   Anxiety    Arthritis    hands, upper back   Asthma    COPD (chronic obstructive pulmonary disease) (HCC)    History of cervical cancer    Menopausal disorder    Osteoporosis    Pneumonia 1960   Spasm of abdominal muscles of right side    intermittent   Stroke ([place]) 2019   TMJ (dislocation of temporomandibular joint)    Wears dentures    partial lower    Past Surgical History:  Procedure Laterality Date   ABDOMINAL HYSTERECTOMY  1970's   bladder botox  2005   BLADDER SUSPENSION  2004   CATARACT EXTRACTION W/PHACO Right 03/09/2018   Procedure: CATARACT EXTRACTION PHACO AND INTRAOCULAR LENS PLACEMENT (Sharon [name]) right;  Surgeon: Eulogio Bear, MD;  Location: [place];  Service: Ophthalmology;  Laterality: Right;  CALL CELL 1ST   CATARACT EXTRACTION W/PHACO Left 04/19/2018   Procedure: CATARACT EXTRACTION PHACO AND INTRAOCULAR LENS PLACEMENT (IOC)  LEFT;  Surgeon: Eulogio Bear, MD;  Location: [place];  Service: Ophthalmology;  Laterality: Left;   COLONOSCOPY WITH PROPOFOL N/A 12/13/2015   Procedure: COLONOSCOPY WITH PROPOFOL;  Surgeon: Lucilla Lame, MD;  Location: [facility];  Service: Endoscopy;  Laterality: N/A;   COLONOSCOPY WITH PROPOFOL N/A 03/14/2021    Procedure: COLONOSCOPY WITH PROPOFOL;  Surgeon: Lucilla Lame, MD;  Location: [place];  Service: Endoscopy;  Laterality: N/A;  diabetic   LOOP RECORDER INSERTION N/A 01/07/2018   Procedure: LOOP RECORDER INSERTION;  Surgeon: Deboraha Sprang, MD;  Location: [place] CV LAB;  Service: Cardiovascular;  Laterality: N/A;   POLYPECTOMY N/A 12/13/2015   Procedure: POLYPECTOMY;  Surgeon: Lucilla Lame, MD;  Location: [facility];  Service: Endoscopy;  Laterality: N/A;  SIGMOID COLON POLYPS X  5   POLYPECTOMY N/A 03/14/2021   Procedure: POLYPECTOMY;  Surgeon: Lucilla Lame, MD;  Location: [facility];  Service: Endoscopy;  Laterality: N/A;   SHOULDER ARTHROSCOPY W/ ROTATOR CUFF REPAIR Right 1998   TEE WITHOUT CARDIOVERSION N/A 01/06/2018   Procedure: TRANSESOPHAGEAL ECHOCARDIOGRAM (TEE);  Surgeon: Minna Merritts, MD;  Location: [facility];  Service: Cardiovascular;  Laterality: N/A;   TONSILLECTOMY AND ADENOIDECTOMY      There were no vitals filed for this visit.   Subjective Assessment - 05/07/21 1526     Subjective  [name] returns today for scheduled follow up-OT visit 55 of 55 -to address BLE phlebo-lymphedema. Pt was last seen on 02/07/21. Pt reports her legs have been doing very well since last visit, despite very warm summer temperatures. She tells me she wears compression stockings as directed. She continues to have discomfort in  her legs biolaterally , but does not rate pain numerically today.    Pertinent History Hx anxiety, COPD, OA, Asthma, hx cervical ca, osteoporosis, hx pneumonia, hx CVA, hx LLE cellulitis, hx L leg wound, hx falls    Limitations chronic BLE leg swelling and associated pain, decreased balance, fall risk, decreased standing, walking and sitting tolerance, difficulty w/ lower body bathing and dressing, difficulty fitting LB clothing and shoes, unable to drive, impaired transfers and functional mobility, impaired social participation, impaired ability  to perform household chores, cooking, meal prep    Repetition Increases Symptoms    Special Tests 11/20 dopler negative for DVT and CVI                          OT Treatments/Exercises (OP) - 05/07/21 1528       ADLs   ADL Education Given Yes      Manual Therapy   Manual Therapy Edema management;Manual Lymphatic Drainage (MLD)    Manual Lymphatic Drainage (MLD) MLD to LE as established. No strokes to R leg in area of cat scratches, but strokes above and lateral/ medical    Compression Bandaging Max A to don compression stocking after manual therapy. Garments appear in excellent condition.                    OT Education - 05/07/21 1551     Education Details Continued Pt/ CG edu for lymphedema self care  and home program throughout session. Topics include multilayer, gradient compression wrapping, simple self-MLD, therapeutic lymphatic pumping exercises, skin/nail care, risk reduction factors and LE precautions, compression garments/recommendations and wear and care schedule and compression garment donning / doffing using assistive devices. All questions answered to the Pt's satisfaction, and Pt demonstrates understanding by report.    Person(s) Educated Patient    Methods Explanation;Demonstration    Comprehension Verbalized understanding;Returned demonstration                 OT Long Term Goals - 02/07/21 1621       OT LONG TERM GOAL #1   Title Pt will demonstrate understanding of lymphedema (LE) precautions / prevention principals, including signs / symptoms of cellulitis infection with modified independence using LE Workbook as printed reference to identify 6 precautions without verbal cues by end of 3rd  OT Rx visit.     Time 3    Period Days    Status Achieved      OT LONG TERM GOAL #2   Title Pt will be able todon and doff existing compression garments independently and without assistive devices and/ or extra time for optimal lymphedema  self management over time and to limit infection risk.    Baseline supervision    Time 4    Period Days    Status Achieved      OT LONG TERM GOAL #3   Title Pt to achieve at least  10% limb volume reduction in affected limb(s)  bilaterally during Intensive Phase CDT to control limb swelling, to improve tissue integrity and immune function, to improve ADLs performance and to improve functional mobility/ transfer, and to improve body image and self-esteem.    Baseline max A    Time 8    Period Weeks    Status Partially Met   RLE decreased 4.5% since 08/22/20. LLE stable with 0.9% increase snce 08/22/20.   Target Date 05/08/21      OT LONG TERM GOAL #4  Title Pt will achieve 100% compliance with daily LE self-care home program components, including daily  skin care, simple self-MLD, gradient compression wraps/ compression garments/devices, and therapeutic exercise with Mod CG support to ensure optimal Intensive Phase limb volume reduction to expedite compression garment/ device fitting.    Baseline 50% compliant    Time 12    Period Weeks    Status Achieved   met by report     OT LONG TERM GOAL #5   Title Pt will use appropriate  assistive devices during LE self-care training to don compression garments/devices with modified independence to ensure optimal LE self-management over time and to limit progression of chronic LE.    Baseline min A    Time 12    Period Weeks    Status Achieved   DC goal     OT LONG TERM GOAL #6   Title Pt will retain optimal limb volume reductions achieved during Intensive Phase CDT with no more than 3% volume increase with ongoing CG assistance to limit LE progression and further functional decline.    Baseline min A    Time 6    Period Months    Status Achieved   Met today with 1.3% reduction in LLE and 1.8% volume increase in R(dominant) LE since last visit on 12/27/20.                  Plan - 05/07/21 1556     Clinical Impression Statement  Mrs. Buxton last seen in April , 2022 for follow along for lymphedema care. Pt reports she is mmanaging will , despite very warm summer temperatures, which tend to exacerbate leg swelling. Compression garments are in good condition and do not currently need replacement. Skin is in excellent condition. Skin changes assosicated with phlebo-lymphedema , including tissue fibrosis and rednes, persist, but do not appear to have progressed since last seen. Mrs. Levit tolerated MLD today without increased pain. She required max A to don R knee high compression stocking after manual thewrapy. Desirae will benefit from periodic follow along to support long-term LE self managaement. She'll return in 1 month .    Occupational performance deficits (Please refer to evaluation for details): ADL's;Leisure;IADL's;Social Participation;Rest and Sleep;Work             Patient will benefit from skilled therapeutic intervention in order to improve the following deficits and impairments:           Visit Diagnosis: Lymphedema, not elsewhere classified - Plan: Ot plan of care cert/re-cert    Problem List Patient Active Problem List   Diagnosis Date Noted   Lab test positive for detection of COVID-19 virus 04/10/2021   Myalgia due to statin 03/18/2021   Personal history of colonic polyps    Rectal polyp    Peripheral vascular disease due to secondary diabetes ([place]) 01/11/2021   Osteopenia of neck of left femur 01/11/2021   Drug-induced myopathy 09/13/2020   Neuropathy of both feet 05/24/2020   Hyperlipidemia associated with type 2 diabetes mellitus ([place]) 02/14/2020   Obesity 02/14/2020   Vitamin D deficiency 06/28/2019   Type 2 diabetes mellitus with obesity ([place]) 06/28/2019   GERD (gastroesophageal reflux disease) 12/07/2018   Vitamin B12 deficiency 12/05/2018   Senile purpura ([place]) 07/28/2018   Lymphedema 07/14/2018   Aneurysm of descending thoracic aorta ([place]) 05/04/2018   Allergic rhinitis 02/16/2018    Aortic atherosclerosis ([place]) 01/13/2018   Chronic kidney disease, stage 3 ([place]) 01/12/2018   History of CVA (  cerebrovascular accident)    TIA (transient ischemic attack) 01/04/2018   CAD (coronary artery disease) 11/25/2017   Caregiver stress 11/25/2017   Menopause 08/14/2017   Stress incontinence 06/19/2017   Personal history of tobacco use, presenting hazards to health 04/01/2017   Anxiety 03/13/2017   Advanced care planning/counseling discussion 10/17/2016   Benign neoplasm of sigmoid colon    Centrilobular emphysema ([place]) 10/17/2015   Mass of upper inner quadrant of right breast 06/22/2015    Andrey Spearman, MS, OTR/L, [facility] 05/07/21 4:01 PM   [place] MAIN [facility] SERVICES [place], [name], [address] Phone: [phone]   Fax:  [phone]  Name: Sharon [name] MRN: [id number] Date of Birth: 1945/10/22

## 2021-05-07 NOTE — Patient Instructions (Signed)
Lymphedema Self- Care Instructions  1. EXERCISE: Perform lymphatic pumping there ex 2 x a day. While wearing your compression wraps or garments. Perform 10 reps of each exercise bilaterally and be sure to perform them in order. Don;t skip around!  OMIT PARTIAL SIT UPs.  2. MLD: Perform simple self-Manual Lymphatic Drainage (MLD) at least once a day as directed.  3. If you have a Flexitouch advanced "pump" use it 1 time each day on a single limb only. The Flexitouch moves lymphatic fluid out of your affected body part and back to your heart, so DO NOT use the Flexi on 2 legs at a time, and DO NOT ues it on 2 legs on the same day. If you experience any atypical shortness of breath, sudden onset of pain, or feelings of heart arhythmia, or racing, discontinue use of the Flexitouch and report these symptoms to your doctor right away.  4. WRAPS: Compression wraps are to be worn 23 hrs/ 7 days/wk during Intensive Phase of Complete Decongestive Therapy (CDT).Building tolerance may take time and practice, so don't get discouraged. If bandages begin to feel tight during periods of inactivity and/or during the night, try performing your exercises to loosen them.   5. GARMENTS: During Management Phase CDT your compression garments are to be worn during waking hours when active. Do NOT sleep in your garments!!   6. PUT YOUR FEET UP! Elevate your feet and legs and feet to the level of your heart whenever you are sitting down.   7. SKIN: Carefully monitor skin condition and perform impeccable hygiene daily. Bathe skin with mild soap and water and apply low pH lotion (aka Eucerin ) to improve hydration and limit infection risk.

## 2021-05-09 ENCOUNTER — Other Ambulatory Visit (INDEPENDENT_AMBULATORY_CARE_PROVIDER_SITE_OTHER): Payer: Self-pay | Admitting: Nurse Practitioner

## 2021-05-09 DIAGNOSIS — L03119 Cellulitis of unspecified part of limb: Secondary | ICD-10-CM

## 2021-05-09 NOTE — Telephone Encounter (Signed)
Is this ok to refill?  

## 2021-05-11 ENCOUNTER — Other Ambulatory Visit: Payer: Self-pay | Admitting: Nurse Practitioner

## 2021-05-11 NOTE — Telephone Encounter (Signed)
last RF 07/17/20 #90 4 RF too soon

## 2021-05-14 ENCOUNTER — Other Ambulatory Visit: Payer: Self-pay | Admitting: Nurse Practitioner

## 2021-05-15 ENCOUNTER — Telehealth: Payer: Self-pay

## 2021-05-15 NOTE — Chronic Care Management (AMB) (Signed)
Chronic Care Management Pharmacy Assistant   Name: Sharon [name]  MRN: [id number] DOB: 09/05/1946   Reason for Encounter: Disease State Diabetes Mellitus   Recent office visits:  None Noted  Recent consult visits:  05/07/21 Andrey Spearman OT(OCC. Therapy)- Patient was seen for Lymphedema. No medication changes and no follow up noted  Hospital visits:  None in previous 6 months  Medications: Outpatient Encounter Medications as of 05/15/2021  Medication Sig   acetaminophen (TYLENOL) 500 MG tablet Take 500 mg by mouth every 6 (six) hours as needed.   Alirocumab (PRALUENT) 150 MG/ML SOAJ Inject 1 mL into the skin every 14 (fourteen) days. (Patient not taking: No sig reported)   Blood Glucose Monitoring Suppl (ONETOUCH VERIO) w/Device KIT Utilize to check blood sugar twice a day, fasting in morning with goal < 130 and then 2 hours after a meal with goal <180.  Document and bring to visits.   budesonide-formoterol (SYMBICORT) 160-4.5 MCG/ACT inhaler INHALE 2 PUFFS BY MOUTH TWICE DAILY   Cholecalciferol 1.25 MG (50000 UT) TABS Take 1 tablet by mouth once a week.   citalopram (CELEXA) 20 MG tablet Take 20 mg by mouth as needed.   clopidogrel (PLAVIX) 75 MG tablet TAKE 1 TABLET(75 MG) BY MOUTH DAILY   Cyanocobalamin 1000 MCG/ML KIT Inject 1,000 mcg as directed every 30 (thirty) days.   furosemide (LASIX) 20 MG tablet Take 0.5 tablets (10 mg total) by mouth daily.   glucose blood (ONETOUCH VERIO) test strip Utilize to check blood sugar twice a day, fasting in morning with goal < 130 and then 2 hours after a meal with goal <180.  Document and bring to visits.   Lancets (ONETOUCH ULTRASOFT) lancets Utilize to check blood sugar twice a day, fasting in morning with goal < 130 and then 2 hours after a meal with goal <180.  Document and bring to visits.   metFORMIN (GLUCOPHAGE) 500 MG tablet Take 1 tablet (500 mg total) by mouth daily with breakfast.   mometasone (ELOCON) 0.1 % cream APPLY TO LOWER  LEGS TWICE DAILY UNTIL RASH IMPROVED. AVOID FACE, GROIN, UNDERARMS   pantoprazole (PROTONIX) 40 MG tablet TAKE 1 TABLET(40 MG) BY MOUTH DAILY   penicillin v potassium (VEETID) 250 MG tablet TAKE 1 TABLET(250 MG) BY MOUTH TWICE DAILY   PROAIR HFA 108 (90 Base) MCG/ACT inhaler INHALE 2 PUFFS INTO THE LUNGS EVERY 6 HOURS AS NEEDED FOR WHEEZING OR SHORTNESS OF BREATH   tacrolimus (PROTOPIC) 0.1 % ointment Apply to lower legs 1-2 times a day until rash improved.   No facility-administered encounter medications on file as of 05/15/2021.    Recent Relevant Labs: Lab Results  Component Value Date/Time   HGBA1C 6.4 (H) 01/15/2021 02:29 PM   HGBA1C 6.5 (H) 07/17/2020 03:09 PM   MICROALBUR 10 01/15/2021 02:26 PM    Kidney Function Lab Results  Component Value Date/Time   CREATININE 0.92 01/15/2021 02:29 PM   CREATININE 0.97 07/17/2020 03:09 PM   GFRNONAA 58 (L) 07/17/2020 03:09 PM   GFRAA 67 07/17/2020 03:09 PM    Current antihyperglycemic regimen:  Metformin 500 mg once daily with breakfast  What recent interventions/DTPs have been made to improve glycemic control:  None  Have there been any recent hospitalizations or ED visits since last visit with CPP? No  Patient reports hypoglycemic symptoms, including None  Patient denies hyperglycemic symptoms, including none  How often are you checking your blood sugar? twice daily  What are your blood sugars ranging?  Fasting:  Before meals: 107 After meals: 149 Bedtime: 137  During the week, how often does your blood glucose drop below 70? Never  Are you checking your feet daily/regularly? Patient stated she checks her feet daily.  Adherence Review: Is the patient currently on a STATIN medication? No Is the patient currently on ACE/ARB medication? No Does the patient have >5 day gap between last estimated fill dates? No          Metformin 500 mg Last filled 04/17/21 90 DS     Patient stated she is losing weight she weighed 209 lbs  when weighing this morning. She has cut back on sugars and drinks more water. She gets about 3500 steps in daily. Patient stated she HAS cut back on sugars but when she wants a Chocolate Banana Pie she is going to have it.  Star Rating Drugs: None Listed  [place] Pharmacist Assistant [phone]

## 2021-05-16 NOTE — Progress Notes (Signed)
Carelink Summary Report / Loop Recorder 

## 2021-05-24 ENCOUNTER — Ambulatory Visit (INDEPENDENT_AMBULATORY_CARE_PROVIDER_SITE_OTHER): Payer: Medicare Other

## 2021-05-24 ENCOUNTER — Other Ambulatory Visit: Payer: Self-pay

## 2021-05-24 DIAGNOSIS — E538 Deficiency of other specified B group vitamins: Secondary | ICD-10-CM | POA: Diagnosis not present

## 2021-05-24 MED ORDER — CYANOCOBALAMIN 1000 MCG/ML IJ SOLN
1000.0000 ug | Freq: Once | INTRAMUSCULAR | Status: AC
  Administered 2021-05-24: 1000 ug via INTRAMUSCULAR

## 2021-05-27 ENCOUNTER — Ambulatory Visit (INDEPENDENT_AMBULATORY_CARE_PROVIDER_SITE_OTHER): Payer: Medicare Other

## 2021-05-27 DIAGNOSIS — Z8673 Personal history of transient ischemic attack (TIA), and cerebral infarction without residual deficits: Secondary | ICD-10-CM

## 2021-05-28 LAB — CUP PACEART REMOTE DEVICE CHECK
Date Time Interrogation Session: 20220808005559
Implantable Pulse Generator Implant Date: 20190328

## 2021-06-06 ENCOUNTER — Encounter: Payer: Self-pay | Admitting: Emergency Medicine

## 2021-06-06 ENCOUNTER — Emergency Department: Payer: Medicare Other

## 2021-06-06 ENCOUNTER — Other Ambulatory Visit: Payer: Self-pay

## 2021-06-06 ENCOUNTER — Emergency Department
Admission: EM | Admit: 2021-06-06 | Discharge: 2021-06-06 | Disposition: A | Discharge status: Home / Self Care | Payer: Medicare Other | Attending: Emergency Medicine | Admitting: Emergency Medicine

## 2021-06-06 DIAGNOSIS — Z20822 Contact with and (suspected) exposure to covid-19: Secondary | ICD-10-CM | POA: Diagnosis not present

## 2021-06-06 DIAGNOSIS — Z7984 Long term (current) use of oral hypoglycemic drugs: Secondary | ICD-10-CM | POA: Insufficient documentation

## 2021-06-06 DIAGNOSIS — J40 Bronchitis, not specified as acute or chronic: Secondary | ICD-10-CM | POA: Diagnosis not present

## 2021-06-06 DIAGNOSIS — Z91040 Latex allergy status: Secondary | ICD-10-CM | POA: Diagnosis not present

## 2021-06-06 DIAGNOSIS — Z8541 Personal history of malignant neoplasm of cervix uteri: Secondary | ICD-10-CM | POA: Diagnosis not present

## 2021-06-06 DIAGNOSIS — Z79899 Other long term (current) drug therapy: Secondary | ICD-10-CM | POA: Insufficient documentation

## 2021-06-06 DIAGNOSIS — J019 Acute sinusitis, unspecified: Secondary | ICD-10-CM | POA: Diagnosis not present

## 2021-06-06 DIAGNOSIS — E1122 Type 2 diabetes mellitus with diabetic chronic kidney disease: Secondary | ICD-10-CM | POA: Insufficient documentation

## 2021-06-06 DIAGNOSIS — I517 Cardiomegaly: Secondary | ICD-10-CM | POA: Diagnosis not present

## 2021-06-06 DIAGNOSIS — I2510 Atherosclerotic heart disease of native coronary artery without angina pectoris: Secondary | ICD-10-CM | POA: Insufficient documentation

## 2021-06-06 DIAGNOSIS — E1140 Type 2 diabetes mellitus with diabetic neuropathy, unspecified: Secondary | ICD-10-CM | POA: Diagnosis not present

## 2021-06-06 DIAGNOSIS — Z85038 Personal history of other malignant neoplasm of large intestine: Secondary | ICD-10-CM | POA: Diagnosis not present

## 2021-06-06 DIAGNOSIS — J45909 Unspecified asthma, uncomplicated: Secondary | ICD-10-CM | POA: Diagnosis not present

## 2021-06-06 DIAGNOSIS — Z87891 Personal history of nicotine dependence: Secondary | ICD-10-CM | POA: Diagnosis not present

## 2021-06-06 DIAGNOSIS — J329 Chronic sinusitis, unspecified: Secondary | ICD-10-CM | POA: Diagnosis not present

## 2021-06-06 DIAGNOSIS — J449 Chronic obstructive pulmonary disease, unspecified: Secondary | ICD-10-CM | POA: Diagnosis not present

## 2021-06-06 DIAGNOSIS — N183 Chronic kidney disease, stage 3 unspecified: Secondary | ICD-10-CM | POA: Diagnosis not present

## 2021-06-06 DIAGNOSIS — R059 Cough, unspecified: Secondary | ICD-10-CM | POA: Diagnosis not present

## 2021-06-06 DIAGNOSIS — Z7902 Long term (current) use of antithrombotics/antiplatelets: Secondary | ICD-10-CM | POA: Insufficient documentation

## 2021-06-06 DIAGNOSIS — R519 Headache, unspecified: Secondary | ICD-10-CM | POA: Diagnosis not present

## 2021-06-06 DIAGNOSIS — I129 Hypertensive chronic kidney disease with stage 1 through stage 4 chronic kidney disease, or unspecified chronic kidney disease: Secondary | ICD-10-CM | POA: Diagnosis not present

## 2021-06-06 DIAGNOSIS — R000 Tachycardia, unspecified: Secondary | ICD-10-CM | POA: Diagnosis not present

## 2021-06-06 DIAGNOSIS — R0602 Shortness of breath: Secondary | ICD-10-CM | POA: Diagnosis not present

## 2021-06-06 LAB — RESP PANEL BY RT-PCR (FLU A&B, COVID) ARPGX2
Influenza A by PCR: NEGATIVE
Influenza B by PCR: NEGATIVE
SARS Coronavirus 2 by RT PCR: NEGATIVE

## 2021-06-06 LAB — CBC
HCT: 41.8 % (ref 36.0–46.0)
Hemoglobin: 14.2 g/dL (ref 12.0–15.0)
MCH: 30.3 pg (ref 26.0–34.0)
MCHC: 34 g/dL (ref 30.0–36.0)
MCV: 89.3 fL (ref 80.0–100.0)
Platelets: 209 10*3/uL (ref 150–400)
RBC: 4.68 MIL/uL (ref 3.87–5.11)
RDW: 13.3 % (ref 11.5–15.5)
WBC: 13.6 10*3/uL — ABNORMAL HIGH (ref 4.0–10.5)
nRBC: 0 % (ref 0.0–0.2)

## 2021-06-06 LAB — BASIC METABOLIC PANEL
Anion gap: 10 (ref 5–15)
BUN: 11 mg/dL (ref 8–23)
CO2: 23 mmol/L (ref 22–32)
Calcium: 8.5 mg/dL — ABNORMAL LOW (ref 8.9–10.3)
Chloride: 103 mmol/L (ref 98–111)
Creatinine, Ser: 0.94 mg/dL (ref 0.44–1.00)
GFR, Estimated: >60 mL/min (ref >60)
Glucose, Bld: 144 mg/dL — ABNORMAL HIGH (ref 70–99)
Potassium: 3.7 mmol/L (ref 3.5–5.1)
Sodium: 136 mmol/L (ref 135–145)

## 2021-06-06 LAB — TROPONIN I (HIGH SENSITIVITY)
Troponin I (High Sensitivity): 47 ng/L — ABNORMAL HIGH (ref <18)
Troponin I (High Sensitivity): 47 ng/L — ABNORMAL HIGH (ref <18)

## 2021-06-06 MED ORDER — AMOXICILLIN-POT CLAVULANATE 875-125 MG PO TABS: 1.0000 | ORAL_TABLET | Freq: Two times a day (BID) | ORAL | 0 refills | Status: AC

## 2021-06-06 MED ORDER — GUAIFENESIN ER 600 MG PO TB12: 600.0000 mg | ORAL_TABLET | Freq: Two times a day (BID) | ORAL | 0 refills | Status: AC

## 2021-06-06 NOTE — ED Provider Notes (Signed)
[facility] Emergency Department Provider Note   ____________________________________________   Event Date/Time   First MD Initiated Contact with Patient 06/06/21 1557     (approximate)  I have reviewed the triage vital signs and the nursing notes.   HISTORY  Chief Complaint Cough, Nasal Congestion, and Headache    HPI Sharon [name] is a 75 y.o. female with past medical history of hyperlipidemia, diabetes, COPD, CAD, stroke, and CKD who presents to the ED with cough and congestion.  Patient reports that she has had 2 days of cough productive of greenish sputum associated with significant nasal drainage and sinus congestion.  She has felt weak and malaised along with subjective fevers, but reports no temperatures above 99.1 at home.  She has had some soreness in her chest, but states this is primarily present when she coughs.  She denies shortness of breath.  She has not had any abdominal pain, nausea, vomiting, diarrhea, dysuria, pain or swelling in her legs.  She is not aware of any sick contacts and has been vaccinated against COVID-19.  She took a home test for COVID-19 that was negative yesterday.        Past Medical History:  Diagnosis Date   Anxiety    Arthritis    hands, upper back   Asthma    COPD (chronic obstructive pulmonary disease) ([place])    History of cervical cancer    Menopausal disorder    Osteoporosis    Pneumonia 1960   Spasm of abdominal muscles of right side    intermittent   Stroke ([place]) 2019   TMJ (dislocation of temporomandibular joint)    Wears dentures    partial lower    Patient Active Problem List   Diagnosis Date Noted   Lab test positive for detection of COVID-19 virus 04/10/2021   Myalgia due to statin 03/18/2021   Personal history of colonic polyps    Rectal polyp    Peripheral vascular disease due to secondary diabetes ([place]) 01/11/2021   Osteopenia of neck of left femur 01/11/2021   Drug-induced myopathy  09/13/2020   Neuropathy of both feet 05/24/2020   Hyperlipidemia associated with type 2 diabetes mellitus ([place]) 02/14/2020   Obesity 02/14/2020   Vitamin D deficiency 06/28/2019   Type 2 diabetes mellitus with obesity ([place]) 06/28/2019   GERD (gastroesophageal reflux disease) 12/07/2018   Vitamin B12 deficiency 12/05/2018   Senile purpura ([place]) 07/28/2018   Lymphedema 07/14/2018   Aneurysm of descending thoracic aorta ([place]) 05/04/2018   Allergic rhinitis 02/16/2018   Aortic atherosclerosis ([place]) 01/13/2018   Chronic kidney disease, stage 3 ([place]) 01/12/2018   History of CVA (cerebrovascular accident)    TIA (transient ischemic attack) 01/04/2018   CAD (coronary artery disease) 11/25/2017   Caregiver stress 11/25/2017   Menopause 08/14/2017   Stress incontinence 06/19/2017   Personal history of tobacco use, presenting hazards to health 04/01/2017   Anxiety 03/13/2017   Advanced care planning/counseling discussion 10/17/2016   Benign neoplasm of sigmoid colon    Centrilobular emphysema ([place]) 10/17/2015   Mass of upper inner quadrant of right breast 06/22/2015    Past Surgical History:  Procedure Laterality Date   ABDOMINAL HYSTERECTOMY  1970's   bladder botox  2005   BLADDER SUSPENSION  2004   CATARACT EXTRACTION W/PHACO Right 03/09/2018   Procedure: CATARACT EXTRACTION PHACO AND INTRAOCULAR LENS PLACEMENT ([place]) right;  Surgeon: Eulogio Bear, MD;  Location: [place];  Service: Ophthalmology;  Laterality: Right;  CALL CELL 1ST   CATARACT EXTRACTION W/PHACO Left 04/19/2018   Procedure: CATARACT EXTRACTION PHACO AND INTRAOCULAR LENS PLACEMENT ([place])  LEFT;  Surgeon: Eulogio Bear, MD;  Location: [place];  Service: Ophthalmology;  Laterality: Left;   COLONOSCOPY WITH PROPOFOL N/A 12/13/2015   Procedure: COLONOSCOPY WITH PROPOFOL;  Surgeon: Lucilla Lame, MD;  Location: [place];  Service: Endoscopy;  Laterality: N/A;   COLONOSCOPY WITH PROPOFOL N/A  03/14/2021   Procedure: COLONOSCOPY WITH PROPOFOL;  Surgeon: Lucilla Lame, MD;  Location: [place];  Service: Endoscopy;  Laterality: N/A;  diabetic   LOOP RECORDER INSERTION N/A 01/07/2018   Procedure: LOOP RECORDER INSERTION;  Surgeon: Deboraha Sprang, MD;  Location: [place] [facility];  Service: Cardiovascular;  Laterality: N/A;   POLYPECTOMY N/A 12/13/2015   Procedure: POLYPECTOMY;  Surgeon: Lucilla Lame, MD;  Location: [place];  Service: Endoscopy;  Laterality: N/A;  SIGMOID COLON POLYPS X  5   POLYPECTOMY N/A 03/14/2021   Procedure: POLYPECTOMY;  Surgeon: Lucilla Lame, MD;  Location: [place];  Service: Endoscopy;  Laterality: N/A;   SHOULDER ARTHROSCOPY W/ ROTATOR CUFF REPAIR Right 1998   TEE WITHOUT CARDIOVERSION N/A 01/06/2018   Procedure: TRANSESOPHAGEAL ECHOCARDIOGRAM (TEE);  Surgeon: Minna Merritts, MD;  Location: [facility];  Service: Cardiovascular;  Laterality: N/A;   TONSILLECTOMY AND ADENOIDECTOMY      Prior to Admission medications   Medication Sig Start Date End Date Taking? Authorizing Provider  amoxicillin-clavulanate (AUGMENTIN) 875-125 MG tablet Take 1 tablet by mouth 2 (two) times daily for 7 days. 06/06/21 06/13/21 Yes Blake Divine, MD  guaiFENesin (MUCINEX) 600 MG 12 hr tablet Take 1 tablet (600 mg total) by mouth 2 (two) times daily for 7 days. 06/06/21 06/13/21 Yes Blake Divine, MD  acetaminophen (TYLENOL) 500 MG tablet Take 500 mg by mouth every 6 (six) hours as needed.    [provider]  Alirocumab (PRALUENT) 150 MG/ML SOAJ Inject 1 mL into the skin every 14 (fourteen) days. Patient not taking: No sig reported 03/22/21   Minna Merritts, MD  Blood Glucose Monitoring Suppl (ONETOUCH VERIO) w/Device KIT Utilize to check blood sugar twice a day, fasting in morning with goal < 130 and then 2 hours after a meal with goal <180.  Document and bring to visits. 07/31/20   [name], Henrine Screws T, NP  budesonide-formoterol (SYMBICORT) 160-4.5  MCG/ACT inhaler INHALE 2 PUFFS BY MOUTH TWICE DAILY 05/01/21   [name], Henrine Screws T, NP  Cholecalciferol 1.25 MG (50000 UT) TABS Take 1 tablet by mouth once a week. 04/29/21   [name], Henrine Screws T, NP  citalopram (CELEXA) 20 MG tablet Take 20 mg by mouth as needed.    [provider]  clopidogrel (PLAVIX) 75 MG tablet TAKE 1 TABLET(75 MG) BY MOUTH DAILY 12/19/20   [name], Jolene T, NP  Cyanocobalamin 1000 MCG/ML KIT Inject 1,000 mcg as directed every 30 (thirty) days.    [provider]  furosemide (LASIX) 20 MG tablet Take 0.5 tablets (10 mg total) by mouth daily. 12/19/20   [name], Henrine Screws T, NP  glucose blood (ONETOUCH VERIO) test strip Utilize to check blood sugar twice a day, fasting in morning with goal < 130 and then 2 hours after a meal with goal <180.  Document and bring to visits. 07/31/20   [name], Henrine Screws T, NP  Lancets (ONETOUCH ULTRASOFT) lancets Utilize to check blood sugar twice a day, fasting in morning with goal < 130 and then 2 hours after a  meal with goal <180.  Document and bring to visits. 07/31/20   [name] T, NP  metFORMIN (GLUCOPHAGE) 500 MG tablet Take 1 tablet (500 mg total) by mouth daily with breakfast. 07/20/20   [name], Jolene T, NP  mometasone (ELOCON) 0.1 % cream APPLY TO LOWER LEGS TWICE DAILY UNTIL RASH IMPROVED. Leonette Most 05/06/21   Brendolyn Patty, MD  pantoprazole (PROTONIX) 40 MG tablet TAKE 1 TABLET(40 MG) BY MOUTH DAILY 07/17/20   [name], Henrine Screws T, NP  penicillin v potassium (VEETID) 250 MG tablet TAKE 1 TABLET(250 MG) BY MOUTH TWICE DAILY 05/09/21   Kris Hartmann, NP  PROAIR HFA 108 (90 Base) MCG/ACT inhaler INHALE 2 PUFFS INTO THE LUNGS EVERY 6 HOURS AS NEEDED FOR WHEEZING OR SHORTNESS OF BREATH 05/14/21   [name], Jolene T, NP  tacrolimus (PROTOPIC) 0.1 % ointment Apply to lower legs 1-2 times a day until rash improved. 12/17/20   Brendolyn Patty, MD    Allergies Statins, Quinolones, Baclofen, Bactrim  [sulfamethoxazole-trimethoprim], Ibuprofen, Latex, Librium [chlordiazepoxide], Naprosyn [naproxen], and Other  Family History  Problem Relation Age of Onset   Diabetes Mother    Heart disease Mother    Stroke Mother    Stroke Maternal Grandmother     Social History Social History   Tobacco Use   Smoking status: Former    Years: 56.00    Types: Cigarettes    Quit date: 07/14/2016    Years since quitting: 4.8   Smokeless tobacco: Never  Vaping Use   Vaping Use: Former  Substance Use Topics   Alcohol use: Yes    Alcohol/week: 0.0 standard drinks    Comment: occassional   Drug use: No    Review of Systems  Constitutional: Positive for subjective fever/chills Eyes: No visual changes. ENT: No sore throat. Cardiovascular: Positive for chest pain. Respiratory: Denies shortness of breath.  Positive for cough. Gastrointestinal: No abdominal pain.  No nausea, no vomiting.  No diarrhea.  No constipation. Genitourinary: Negative for dysuria. Musculoskeletal: Negative for back pain. Skin: Negative for rash. Neurological: Negative for headaches, focal weakness or numbness.  ____________________________________________   PHYSICAL EXAM:  VITAL SIGNS: ED Triage Vitals  Enc Vitals Group     BP 06/06/21 1440 97/76     Pulse Rate 06/06/21 1440 (!) 112     Resp 06/06/21 1440 (!) 24     Temp 06/06/21 1440 99.2 F (37.3 C)     Temp Source 06/06/21 1440 Oral     SpO2 06/06/21 1440 94 %     Weight 06/06/21 1441 212 lb 15.4 oz (96.6 kg)     Height 06/06/21 1441 5' 9"  (1.753 m)     Head Circumference --      Peak Flow --      Pain Score 06/06/21 1440 7     Pain Loc --      Pain Edu? --      Excl. in [place]? --     Constitutional: Alert and oriented. Eyes: Conjunctivae are normal. Head: Atraumatic. Nose: No congestion/rhinnorhea. Mouth/Throat: Mucous membranes are moist. Neck: Normal ROM Cardiovascular: Normal rate, regular rhythm. Grossly normal heart sounds.  2+ radial pulses  bilaterally. Respiratory: Normal respiratory effort.  No retractions. Lungs CTAB. Gastrointestinal: Soft and nontender. No distention. Genitourinary: deferred Musculoskeletal: No lower extremity tenderness nor edema. Neurologic:  Normal speech and language. No gross focal neurologic deficits are appreciated. Skin:  Skin is warm, dry and intact. No rash noted. Psychiatric: Mood and affect are normal.  Speech and behavior are normal.  ____________________________________________   LABS (all labs ordered are listed, but only abnormal results are displayed)  Labs Reviewed  BASIC METABOLIC PANEL - Abnormal; Notable for the following components:      Result Value   Glucose, Bld 144 (*)    Calcium 8.5 (*)    All other components within normal limits  CBC - Abnormal; Notable for the following components:   WBC 13.6 (*)    All other components within normal limits  TROPONIN I (HIGH SENSITIVITY) - Abnormal; Notable for the following components:   Troponin I (High Sensitivity) 47 (*)    All other components within normal limits  TROPONIN I (HIGH SENSITIVITY) - Abnormal; Notable for the following components:   Troponin I (High Sensitivity) 47 (*)    All other components within normal limits  RESP PANEL BY RT-PCR (FLU A&B, COVID) [id number]   ____________________________________________  EKG  ED ECG REPORT I, Blake Divine, the attending physician, personally viewed and interpreted this ECG.   Date: 06/06/2021  EKG Time: 14:43  Rate: 112  Rhythm: sinus tachycardia  Axis: Normal  Intervals:none  ST&T Change: None    PROCEDURES  Procedure(s) performed (including Critical Care):  Procedures   ____________________________________________   INITIAL IMPRESSION / ASSESSMENT AND PLAN / ED COURSE      75 year old female with past medical history of hyperlipidemia, diabetes, COPD, CAD, stroke, and CKD who presents to the ED with 2 days of productive cough, nasal drainage,  subjective fevers, and soreness in her chest.  She is not in any respiratory distress and is maintaining O2 sats on room air, no wheezing to suggest COPD exacerbation at this time.  Chest x-ray reviewed by me and shows no infiltrate, edema, or effusion.  EKG shows no evidence of arrhythmia or ischemia, we will add on troponin but chest pain seems to be musculoskeletal from her frequent coughing and I doubt ACS or PE.  Remainder of labs are unremarkable.  Suspect patient's symptoms are due to bronchitis versus sinusitis.  Troponin mildly elevated but stable on recheck, doubt ACS at this time.  Patient now requesting to be discharged home and we will treat symptomatically with Mucinex along with a course of Augmentin given her purulent sputum and nasal drainage.  She was counseled to follow-up with cardiology or her PCP, counseled to return to the ED for new worsening symptoms.  Patient agrees with plan.      ____________________________________________   FINAL CLINICAL IMPRESSION(S) / ED DIAGNOSES  Final diagnoses:  Bronchitis  Acute non-recurrent sinusitis, unspecified location     ED Discharge Orders          Ordered    amoxicillin-clavulanate (AUGMENTIN) 875-125 MG tablet  2 times daily        06/06/21 2009    guaiFENesin (MUCINEX) 600 MG 12 hr tablet  2 times daily        06/06/21 2009             Note:  This document was prepared using Dragon voice recognition software and may include unintentional dictation errors.    Blake Divine, MD 06/06/21 2014

## 2021-06-06 NOTE — ED Notes (Signed)
Patient provided with recliner and snack, per MD patient ok to use personal inhaler.

## 2021-06-06 NOTE — ED Triage Notes (Signed)
Pt comes into the ED via POV c/o cough, congestion, and headache.  Pt denies having been around anyone that is sick, but she did take her husband to dialysis and an [place] earlier in the week.  Pt states her symptoms started Monday.  Pt did a COVID home test that was negative.  Pt admits to increased SHOB.

## 2021-06-10 ENCOUNTER — Ambulatory Visit: Payer: Medicare Other | Admitting: Occupational Therapy

## 2021-06-11 ENCOUNTER — Ambulatory Visit: Payer: Medicare Other | Admitting: Dermatology

## 2021-06-14 NOTE — Progress Notes (Signed)
Carelink Summary Report / Loop Recorder 

## 2021-06-19 ENCOUNTER — Other Ambulatory Visit: Payer: Self-pay | Admitting: Nurse Practitioner

## 2021-06-19 MED ORDER — AMOXICILLIN-POT CLAVULANATE 875-125 MG PO TABS: 1.0000 | ORAL_TABLET | Freq: Two times a day (BID) | ORAL | 0 refills | Status: AC

## 2021-06-24 ENCOUNTER — Other Ambulatory Visit: Payer: Self-pay

## 2021-06-24 ENCOUNTER — Ambulatory Visit (INDEPENDENT_AMBULATORY_CARE_PROVIDER_SITE_OTHER): Payer: Medicare Other

## 2021-06-24 DIAGNOSIS — E538 Deficiency of other specified B group vitamins: Secondary | ICD-10-CM | POA: Diagnosis not present

## 2021-06-24 MED ORDER — CYANOCOBALAMIN 1000 MCG/ML IJ SOLN
1000.0000 ug | Freq: Once | INTRAMUSCULAR | Status: AC
  Administered 2021-06-24: 1000 ug via INTRAMUSCULAR

## 2021-06-26 LAB — CUP PACEART REMOTE DEVICE CHECK
Date Time Interrogation Session: 20220910005416
Implantable Pulse Generator Implant Date: 20190328

## 2021-07-01 ENCOUNTER — Ambulatory Visit (INDEPENDENT_AMBULATORY_CARE_PROVIDER_SITE_OTHER): Payer: Medicare Other

## 2021-07-01 DIAGNOSIS — Z8673 Personal history of transient ischemic attack (TIA), and cerebral infarction without residual deficits: Secondary | ICD-10-CM

## 2021-07-02 ENCOUNTER — Telehealth: Payer: Self-pay | Admitting: Acute Care

## 2021-07-02 DIAGNOSIS — Z87891 Personal history of nicotine dependence: Secondary | ICD-10-CM

## 2021-07-03 NOTE — Telephone Encounter (Signed)
Sharon [name] last LCS CT was done 07/31/20. Sharon [name] and I have her new LCS Ct scheduled on 08/01/21  @ 1:30pm LVM for patient to call for appt information

## 2021-07-03 NOTE — Telephone Encounter (Signed)
New  CT order placed

## 2021-07-03 NOTE — Telephone Encounter (Signed)
Langley Gauss I will need a new LCS CT order before this CT can be scheduled

## 2021-07-03 NOTE — Telephone Encounter (Signed)
Sharon [name] can you help with this appt since that is further out in October

## 2021-07-04 NOTE — Telephone Encounter (Signed)
I have now spoke with Mrs. Tseng and she is aware of the appt and location

## 2021-07-08 NOTE — Progress Notes (Signed)
Carelink Summary Report / Loop Recorder 

## 2021-07-12 MED ORDER — AMOXICILLIN-POT CLAVULANATE 875-125 MG PO TABS: 1.0000 | ORAL_TABLET | Freq: Two times a day (BID) | ORAL | 0 refills | Status: AC

## 2021-07-12 NOTE — Progress Notes (Signed)
Patient reports ongoing greenish productive cough via telephone -- requests extension of Augmentin on 07/12/21.

## 2021-07-12 NOTE — Addendum Note (Signed)
Addended by: [name] T on: 07/12/2021 10:46 AM   Modules accepted: Orders

## 2021-07-14 ENCOUNTER — Other Ambulatory Visit: Payer: Self-pay | Admitting: Nurse Practitioner

## 2021-07-15 ENCOUNTER — Other Ambulatory Visit: Payer: Self-pay

## 2021-07-15 ENCOUNTER — Ambulatory Visit (INDEPENDENT_AMBULATORY_CARE_PROVIDER_SITE_OTHER): Payer: Medicare Other | Admitting: Dermatology

## 2021-07-15 DIAGNOSIS — L821 Other seborrheic keratosis: Secondary | ICD-10-CM

## 2021-07-15 DIAGNOSIS — I872 Venous insufficiency (chronic) (peripheral): Secondary | ICD-10-CM

## 2021-07-15 DIAGNOSIS — D692 Other nonthrombocytopenic purpura: Secondary | ICD-10-CM | POA: Diagnosis not present

## 2021-07-15 MED ORDER — PIMECROLIMUS 1 % EX CREA: TOPICAL_CREAM | CUTANEOUS | 4 refills | Status: DC

## 2021-07-15 NOTE — Patient Instructions (Addendum)
If you have any questions or concerns for your doctor, please call our main line at [phone] and press option 4 to reach your doctor's medical assistant. If no one answers, please leave a voicemail as directed and we will return your call as soon as possible. Messages left after 4 pm will be answered the following business day.   You may also send [place] a message via [place]. We typically respond to MyChart messages within 1-2 business days.  For prescription refills, please ask your pharmacy to contact our office. Our fax number is [phone].  If you have an urgent issue when the clinic is closed that cannot wait until the next business day, you can page your doctor at the number below.    Please note that while we do our best to be available for urgent issues outside of office hours, we are not available 24/7.   If you have an urgent issue and are unable to reach [place], you may choose to seek medical care at your doctor's office, retail clinic, urgent care center, or emergency room.  If you have a medical emergency, please immediately call 911 or go to the emergency department.  Pager Numbers  - Dr. Nehemiah Massed: [phone]  - Dr. Laurence Ferrari: [phone]  - Dr. Nicole Kindred: [phone]  In the event of inclement weather, please call our main line at [phone] for an update on the status of any delays or closures.  Dermatology Medication Tips: Please keep the boxes that topical medications come in in order to help keep track of the instructions about where and how to use these. Pharmacies typically print the medication instructions only on the boxes and not directly on the medication tubes.   If your medication is too expensive, please contact our office at [phone] option 4 or send [place] a message through [place].   We are unable to tell what your co-pay for medications will be in advance as this is different depending on your insurance coverage. However, we may be able to find a substitute  medication at lower cost or fill out paperwork to get insurance to cover a needed medication.   If a prior authorization is required to get your medication covered by your insurance company, please allow [place] 1-2 business days to complete this process.  Drug prices often vary depending on where the prescription is filled and some pharmacies may offer cheaper prices.  The website www.goodrx.com contains coupons for medications through different pharmacies. The prices here do not account for what the cost may be with help from insurance (it may be cheaper with your insurance), but the website can give you the price if you did not use any insurance.  - You can print the associated coupon and take it with your prescription to the pharmacy.  - You may also stop by our office during regular business hours and pick up a GoodRx coupon card.  - If you need your prescription sent electronically to a different pharmacy, notify our office through [facility] or by phone at [phone] option 4.   Stasis Dermatitis Stasis dermatitis is a long-term (chronic) skin condition that happens when veins can no longer pump blood back to the heart (poor circulation). This condition causes a red or brown scaly rash or sores (ulcers) from the pooling of blood (stasis). This condition usually affects the lower legs. It may affect one leg or both legs. Without treatment, severe stasis dermatitis can lead to other skin conditions and infections. What are the  causes? This condition is caused by poor circulation. What increases the risk? You are more likely to develop this condition if: You are not very active. You stand for long periods of time. You have veins that have become enlarged and twisted (varicose veins). You have leg veins that are not strong enough to send blood back to the heart (venous insufficiency). You have had a blood clot. You have been pregnant many times. You have had vein surgery. You are  obese. You have heart or kidney failure. You are 54 years of age or older. You have had injuries to your legs in the past. What are the signs or symptoms? Common early symptoms of this condition include: Itchiness in one or both of your legs. Swelling in your ankle or leg. This might get better overnight but be worse again during the day. Skin that looks thin on your ankle and leg. Red or brown marks that develop slowly. Skin that is dry, cracked, or easily irritated. Red, swollen skin that is sore or has a burning feeling. An achy or heavy feeling after you walk or stand for long periods of time. Pain. Later and more severe symptoms of this condition include: Skin that looks shiny. Small, open sores (ulcers). These are often red or purple and leak fluid. Skin that feels hard. Severe itching. A change in the shape or color of your lower legs. Severe pain. Difficulty walking. How is this diagnosed? This condition may be diagnosed based on: Your symptoms and medical history. A physical exam. You may also have tests, including: Blood tests. Imaging tests to check blood flow (Doppler ultrasound). Allergy tests. You may need to see a health care provider who specializes in skin diseases (dermatologist). How is this treated? This condition may be treated with: Compression stockings or an elastic wrap to improve circulation. Medicines, such as: Corticosteroid creams and ointments. Non-corticosteroid medicines applied to the skin (topical). Medicine to reduce swelling in the legs (diuretics). Antibiotics. Medicine to relieve itching (antihistamines). A bandage (dressing). A wrap that contains zinc and gelatin (Unna boot). Follow these instructions at home: Skin care Moisturize your skin as told by your health care provider. Do not use moisturizers with fragrance. This can irritate your skin. Apply a cool, wet cloth (cool compress) to the affected areas. Do not scratch your  skin. Do not rub your skin dry after a bath or shower. Gently pat your skin dry. Do not use scented soaps, detergents, or perfumes. Medicines Take or use over-the-counter and prescription medicines only as told by your health care provider. If you were prescribed an antibiotic medicine, take or use it as told by your health care provider. Do not stop taking or using the antibiotic even if your condition improves. Activity Walk as told by your health care provider. Walking increases blood flow. Do calf and ankle exercises throughout the day as told by your health care provider. This will help increase blood flow. Raise (elevate) your legs above the level of your heart when you are sitting or lying down. Lifestyle Work with your health care provider to lose weight, if needed. Do not cross your legs when you sit. Do not stand or sit in one position for long periods of time. Wear comfortable, loose-fitting clothing. Circulation in your legs will be worse if you wear tight pants, belts, and waistbands. Do not use any products that contain nicotine or tobacco, such as cigarettes, e-cigarettes, and chewing tobacco. If you need help quitting, ask your health care  provider. General instructions If you were asked to use one of the following to help with your condition, follow instructions from your health care provider on how to: Remove and change any dressing. Wear compression stockings. These stockings help to prevent blood clots and reduce swelling in your legs. Wear the [employer]. Keep all follow-up visits as told by your health care provider. This is important. Contact a health care provider if: Your condition does not improve with treatment. Your condition gets worse. You have signs of infection in the affected area. Watch for: Swelling. Tenderness. Redness. Soreness. Warmth. You have a fever. Get help right away if: You notice red streaks coming from the affected area. Your bone or  joint underneath the affected area becomes painful after the skin has healed. The affected area turns darker. You feel a deep pain in your leg or groin. You are short of breath. Summary Stasis dermatitis is a long-term (chronic) skin condition that happens when veins can no longer pump blood back to the heart (poor circulation). Wear compression stockings as told by your health care provider. These stockings help to prevent blood clots and reduce swelling in your legs. Follow instructions from your health care provider about activity, medicines, and lifestyle. Contact a health care provider if you have a fever or have signs of infection in the affected area. Keep all follow-up visits as told by your health care provider. This is important. This information is not intended to replace advice given to you by your health care provider. Make sure you discuss any questions you have with your health care provider. Document Revised: 12/10/2020 Document Reviewed: 12/10/2020 Elsevier Patient Education  2022 [place].    Stop Mometasone cream for now, may restart 1 to 2 times a day as needed for itching or flares  Start Elidel cream 1 to 2 times a day to lower legs, if not covered may continue Tacrolimus increasing to 1 to 2 times a day Continue compression socks Continue lymphedema pump daily

## 2021-07-15 NOTE — Progress Notes (Signed)
   Follow-Up Visit   Subjective  Sharon [name] is a 75 y.o. female who presents for the following: Stasis dermatitis (Bil lower legs, legs stay sore, not sure if any better, Mometasone cr bid, Tacrolimus 0.1% oint prn, compression hose, lymphedema pump qd).  Tacrolimus burns so she doesn't use it.   The following portions of the chart were reviewed this encounter and updated as appropriate:       Review of Systems:  No other skin or systemic complaints except as noted in HPI or Assessment and Plan.  Objective  Well appearing patient in no apparent distress; mood and affect are within normal limits.  A focused examination was performed including bil lower legs. Relevant physical exam findings are noted in the Assessment and Plan.  bil lower legs Pitting edema bil pretibial, mild erythema bil lower pretibia, no scale. No warmth   Assessment & Plan   Seborrheic Keratoses - Stuck-on, waxy, tan-brown papules and/or plaques  - Benign-appearing - Discussed benign etiology and prognosis. - Observe - Call for any changes  Purpura - Chronic; persistent and recurrent.  Treatable, but not curable. - Violaceous macules and patches - Benign - Related to trauma, age, sun damage and/or use of blood thinners, chronic use of topical and/or oral steroids - Observe - Can use OTC arnica containing moisturizer such as Dermend Bruise Formula if desired - Call for worsening or other concerns - bil arms  Stasis dermatitis of both legs bil lower legs  Chronic condition, some improvement  Stasis in the legs causes chronic leg swelling, which may result in itchy or painful rashes, skin discoloration, skin texture changes, and sometimes ulceration.  Recommend daily compression hose/stockings- easiest to put on first thing in morning, remove at bedtime.  Elevate legs as much as possible. Avoid salt/sodium rich foods.  No evidence of cellulitis D/c Mometasone cr may restart qd/bid prn  itching/flares, Caution atrophy with long-term use.  Start Elidel cream qd/bid to see if better tolerated, if not covered may cont Tacrolimus increasing to qd, burning may subside over time Cont daily compression socks Cont lymphedema pump qd  pimecrolimus (ELIDEL) 1 % cream - bil lower legs Apply topically as directed. Qd to bid aa rash on lower legs  Return in about 9 weeks (around 09/16/2021) for stasis dermatitis.  I, Othelia Pulling, RMA, am acting as scribe for Brendolyn Patty, MD .  Documentation: I have reviewed the above documentation for accuracy and completeness, and I agree with the above.  Brendolyn Patty MD

## 2021-07-24 ENCOUNTER — Ambulatory Visit (INDEPENDENT_AMBULATORY_CARE_PROVIDER_SITE_OTHER): Payer: Medicare Other

## 2021-07-24 ENCOUNTER — Other Ambulatory Visit: Payer: Self-pay

## 2021-07-24 DIAGNOSIS — E538 Deficiency of other specified B group vitamins: Secondary | ICD-10-CM

## 2021-07-24 MED ORDER — CYANOCOBALAMIN 1000 MCG/ML IJ SOLN
1000.0000 ug | Freq: Once | INTRAMUSCULAR | Status: AC
  Administered 2021-07-24: 1000 ug via INTRAMUSCULAR

## 2021-07-24 NOTE — Progress Notes (Signed)
Patient presents today for he B-12 injection. Patient received in her left deltoid, patient tolerated well.   

## 2021-07-25 ENCOUNTER — Other Ambulatory Visit: Payer: Self-pay | Admitting: Nurse Practitioner

## 2021-07-25 NOTE — Telephone Encounter (Signed)
Requested Prescriptions  Pending Prescriptions Disp Refills  . pantoprazole (PROTONIX) 40 MG tablet [Pharmacy Med Name: PANTOPRAZOLE 40MG  TABLETS] 90 tablet 0    Sig: TAKE 1 TABLET(40 MG) BY MOUTH DAILY     Gastroenterology: Proton Pump Inhibitors Passed - 07/25/2021  6:21 AM      Passed - Valid encounter within last 12 months    Recent Outpatient Visits          3 months ago Lab test positive for detection of COVID-19 virus   [facility] [place], [place] T, NP   6 months ago Type 2 diabetes mellitus with obesity ([place])   [place], [place] T, NP   1 year ago Encounter for annual physical exam   [place] [place], Henrine Screws T, NP   1 year ago Wound of right lower extremity, subsequent encounter   [facility] Kathrine Haddock, NP   1 year ago Cellulitis of right lower extremity   [place] Kathrine Haddock, NP      Future Appointments            In 1 week [name], NP [employer], [place]   In 2 months Brendolyn Patty, MD [place]   In 9 months  [facility], [place]

## 2021-07-27 ENCOUNTER — Encounter: Payer: Self-pay | Admitting: Nurse Practitioner

## 2021-08-01 ENCOUNTER — Other Ambulatory Visit: Payer: Self-pay

## 2021-08-01 ENCOUNTER — Ambulatory Visit
Admission: RE | Admit: 2021-08-01 | Discharge: 2021-08-01 | Disposition: A | Discharge status: Home / Self Care | Payer: Medicare Other | Source: Ambulatory Visit | Attending: Acute Care | Admitting: Acute Care

## 2021-08-01 DIAGNOSIS — Z87891 Personal history of nicotine dependence: Secondary | ICD-10-CM | POA: Diagnosis not present

## 2021-08-02 ENCOUNTER — Encounter: Payer: Medicare Other | Admitting: Nurse Practitioner

## 2021-08-06 ENCOUNTER — Other Ambulatory Visit: Payer: Self-pay | Admitting: Acute Care

## 2021-08-06 DIAGNOSIS — Z87891 Personal history of nicotine dependence: Secondary | ICD-10-CM

## 2021-08-06 NOTE — Telephone Encounter (Signed)
Patient's physical has been scheduled.

## 2021-08-07 MED ORDER — DOXYCYCLINE HYCLATE 100 MG PO TABS: 100.0000 mg | ORAL_TABLET | Freq: Two times a day (BID) | ORAL | 0 refills | Status: AC

## 2021-08-08 ENCOUNTER — Other Ambulatory Visit: Payer: Self-pay

## 2021-08-08 ENCOUNTER — Ambulatory Visit: Payer: Medicare Other | Attending: Nurse Practitioner | Admitting: Occupational Therapy

## 2021-08-08 DIAGNOSIS — I890 Lymphedema, not elsewhere classified: Secondary | ICD-10-CM | POA: Insufficient documentation

## 2021-08-08 NOTE — Therapy (Signed)
[place] MAIN [facility] SERVICES [address] [place], [name], [address] Phone: [phone]   Fax:  [phone]  Occupational Therapy Treatment  Patient Details  Name: Sharon [name] MRN: [id number] Date of Birth: [birth year] No data recorded  Encounter Date: 08/08/2021   OT End of Session - 08/08/21 1010     Visit Number 4    Number of Visits 51    Date for OT Re-Evaluation 11/06/21    OT Start Time 1005    OT Stop Time 1105    OT Time Calculation (min) 60 min    Activity Tolerance Patient tolerated treatment well;No increased pain    Behavior During Therapy WFL for tasks assessed/performed             Past Medical History:  Diagnosis Date   Anxiety    Arthritis    hands, upper back   Asthma    COPD (chronic obstructive pulmonary disease) (HCC)    History of cervical cancer    Menopausal disorder    Osteoporosis    Pneumonia 1960   Spasm of abdominal muscles of right side    intermittent   Stroke ([place]) 2019   TMJ (dislocation of temporomandibular joint)    Wears dentures    partial lower    Past Surgical History:  Procedure Laterality Date   ABDOMINAL HYSTERECTOMY  1970's   bladder botox  2005   BLADDER SUSPENSION  2004   CATARACT EXTRACTION W/PHACO Right 03/09/2018   Procedure: CATARACT EXTRACTION PHACO AND INTRAOCULAR LENS PLACEMENT ([place]) right;  Surgeon: Eulogio Bear, MD;  Location: [place];  Service: Ophthalmology;  Laterality: Right;  CALL CELL 1ST   CATARACT EXTRACTION W/PHACO Left 04/19/2018   Procedure: CATARACT EXTRACTION PHACO AND INTRAOCULAR LENS PLACEMENT (I[facility])  LEFT;  Surgeon: Eulogio Bear, MD;  Location: [place];  Service: Ophthalmology;  Laterality: Left;   COLONOSCOPY WITH PROPOFOL N/A 12/13/2015   Procedure: COLONOSCOPY WITH PROPOFOL;  Surgeon: Lucilla Lame, MD;  Location: [place];  Service: Endoscopy;  Laterality: N/A;   COLONOSCOPY WITH PROPOFOL N/A 03/14/2021    Procedure: COLONOSCOPY WITH PROPOFOL;  Surgeon: Lucilla Lame, MD;  Location: [place];  Service: Endoscopy;  Laterality: N/A;  diabetic   LOOP RECORDER INSERTION N/A 01/07/2018   Procedure: LOOP RECORDER INSERTION;  Surgeon: Deboraha Sprang, MD;  Location: [place] [facility];  Service: Cardiovascular;  Laterality: N/A;   POLYPECTOMY N/A 12/13/2015   Procedure: POLYPECTOMY;  Surgeon: Lucilla Lame, MD;  Location: [place];  Service: Endoscopy;  Laterality: N/A;  SIGMOID COLON POLYPS X  5   POLYPECTOMY N/A 03/14/2021   Procedure: POLYPECTOMY;  Surgeon: Lucilla Lame, MD;  Location: [place];  Service: Endoscopy;  Laterality: N/A;   SHOULDER ARTHROSCOPY W/ ROTATOR CUFF REPAIR Right 1998   TEE WITHOUT CARDIOVERSION N/A 01/06/2018   Procedure: TRANSESOPHAGEAL ECHOCARDIOGRAM (TEE);  Surgeon: Minna Merritts, MD;  Location: [facility];  Service: Cardiovascular;  Laterality: N/A;   TONSILLECTOMY AND ADENOIDECTOMY      There were no vitals filed for this visit.   Subjective Assessment - 08/08/21 1426     Subjective  Mrs Gramajo returns today for scheduled follow up-OT visit 12 of 46 -to address BLE phlebo-lymphedema. Pt was last seen 7//22. She reports she missed August follow-along due to episode of chronic bronchitis. She reports that her legs have been more swoillen lately and hurting , especially the L. Pt does endorse leg pain  today, but does not rate it numnerically.    Pertinent History Hx anxiety, COPD, OA, Asthma, hx cervical ca, osteoporosis, hx pneumonia, hx CVA, hx LLE cellulitis, hx L leg wound, hx falls    Limitations chronic BLE leg swelling and associated pain, decreased balance, fall risk, decreased standing, walking and sitting tolerance, difficulty w/ lower body bathing and dressing, difficulty fitting LB clothing and shoes, unable to drive, impaired transfers and functional mobility, impaired social participation, impaired ability to perform household chores,  cooking, meal prep    Repetition Increases Symptoms    Special Tests 11/20 dopler negative for DVT and CVI    Currently in Pain? Yes    Pain Location Leg    Pain Orientation Right;Left    Pain Descriptors / Indicators Aching;Sore;Tender;Throbbing;Tightness;Tiring;Heaviness    Pain Type Chronic pain    Pain Relieving Factors elevation, rubbing, MLD, soaking, compression                          OT Treatments/Exercises (OP) - 08/08/21 1430       ADLs   ADL Education Given Yes      Manual Therapy   Manual Therapy Edema management;Manual Lymphatic Drainage (MLD)    Manual Lymphatic Drainage (MLD) MLD to LE as established utilizing short neck sequence ( clavicle only), functional inguinals and proximal to distalk  lower extremity segments to toes utilizing functional inguinal LN.    Compression Bandaging Max A to don compression stocking after manual therapy. Garments appear in excellent condition.                    OT Education - 08/08/21 1433     Education Details Continued Pt/ CG edu for lymphedema self care  and home program throughout session. Topics include multilayer, gradient compression wrapping, simple self-MLD, therapeutic lymphatic pumping exercises, skin/nail care, risk reduction factors and LE precautions, compression garments/recommendations and wear and care schedule and compression garment donning / doffing using assistive devices. All questions answered to the Pt's satisfaction, and Pt demonstrates understanding by report. Pt reminded to take diuretic, and all perscribed medications as directed. Pt advised not to DC a medication without discussing with perscribing provider    Person(s) Educated Patient    Methods Explanation;Demonstration    Comprehension Verbalized understanding;Returned demonstration                 OT Long Term Goals - 02/07/21 1621       OT LONG TERM GOAL #1   Title Pt will demonstrate understanding of  lymphedema (LE) precautions / prevention principals, including signs / symptoms of cellulitis infection with modified independence using LE Workbook as printed reference to identify 6 precautions without verbal cues by end of 3rd  OT Rx visit.     Time 3    Period Days    Status Achieved      OT LONG TERM GOAL #2   Title Pt will be able todon and doff existing compression garments independently and without assistive devices and/ or extra time for optimal lymphedema self management over time and to limit infection risk.    Baseline supervision    Time 4    Period Days    Status Achieved      OT LONG TERM GOAL #3   Title Pt to achieve at least  10% limb volume reduction in affected limb(s)  bilaterally during Intensive Phase CDT to control limb swelling, to improve tissue integrity  and immune function, to improve ADLs performance and to improve functional mobility/ transfer, and to improve body image and self-esteem.    Baseline max A    Time 8    Period Weeks    Status Partially Met   RLE decreased 4.5% since 08/22/20. LLE stable with 0.9% increase snce 08/22/20.   Target Date 05/08/21      OT LONG TERM GOAL #4   Title Pt will achieve 100% compliance with daily LE self-care home program components, including daily  skin care, simple self-MLD, gradient compression wraps/ compression garments/devices, and therapeutic exercise with Mod CG support to ensure optimal Intensive Phase limb volume reduction to expedite compression garment/ device fitting.    Baseline 50% compliant    Time 12    Period Weeks    Status Achieved   met by report     OT LONG TERM GOAL #5   Title Pt will use appropriate  assistive devices during LE self-care training to don compression garments/devices with modified independence to ensure optimal LE self-management over time and to limit progression of chronic LE.    Baseline min A    Time 12    Period Weeks    Status Achieved   DC goal     OT LONG TERM GOAL #6    Title Pt will retain optimal limb volume reductions achieved during Intensive Phase CDT with no more than 3% volume increase with ongoing CG assistance to limit LE progression and further functional decline.    Baseline min A    Time 6    Period Months    Status Achieved   Met today with 1.3% reduction in LLE and 1.8% volume increase in R(dominant) LE since last visit on 12/27/20.                  Plan - 08/08/21 1017     Clinical Impression Statement Mrs. Hilburn last seen in July, 2022  for lymphedema care after transitioning from Intensive to sellf-management phase of treatment. Pt returns today for follow- along after missing scheduled 06/10/21   f/u due to illness.  Pt reports increased pain and swelling in LLE, which is visible today with 1+ pitting. Skin hydration is excellent. Redness is essentially unchanged, except for silver dollar sized circular area of darker redness at posterior R distal leg. This area may be vulbnerable to stasis ulcer in the future. We'll monmitor closely. Pt tells me that she has not taken her diuretic recently. From palpation, bilateral distribution, and quick reduction of fluid volume in leg it is clear thje majority of leg and foot swelling observed today is 2/2 systemic fluid retention and dependent d positioning. Pt encouraged to take her diuretic as directed. She agrees with plan to return in 1 month for f/u.    Occupational performance deficits (Please refer to evaluation for details): ADL's;Leisure;IADL's;Social Participation;Rest and Sleep;Work             Patient will benefit from skilled therapeutic intervention in order to improve the following deficits and impairments:           Visit Diagnosis: Lymphedema, not elsewhere classified    Problem List Patient Active Problem List   Diagnosis Date Noted   History of 2019 novel coronavirus disease (COVID-19) 04/10/2021   Myalgia due to statin 03/18/2021   Personal history of colonic  polyps    Rectal polyp    Peripheral vascular disease due to secondary diabetes ([place]) 01/11/2021   Osteopenia of  neck of left femur 01/11/2021   Drug-induced myopathy 09/13/2020   Neuropathy of both feet 05/24/2020   Hyperlipidemia associated with type 2 diabetes mellitus ([place]) 02/14/2020   Obesity 02/14/2020   Vitamin D deficiency 06/28/2019   Type 2 diabetes mellitus with obesity ([place]) 06/28/2019   GERD (gastroesophageal reflux disease) 12/07/2018   Vitamin B12 deficiency 12/05/2018   Senile purpura ([place]) 07/28/2018   Lymphedema 07/14/2018   Aneurysm of descending thoracic aorta 05/04/2018   Allergic rhinitis 02/16/2018   Aortic atherosclerosis ([place]) 01/13/2018   Chronic kidney disease, stage 3 ([place]) 01/12/2018   History of CVA (cerebrovascular accident)    TIA (transient ischemic attack) 01/04/2018   CAD (coronary artery disease) 11/25/2017   Caregiver stress 11/25/2017   Menopause 08/14/2017   Stress incontinence 06/19/2017   Personal history of tobacco use, presenting hazards to health 04/01/2017   Anxiety 03/13/2017   Advanced care planning/counseling discussion 10/17/2016   Benign neoplasm of sigmoid colon    Centrilobular emphysema ([place]) 10/17/2015    Andrey Spearman, MS, OTR/L, CLT-LANA 08/08/21 2:35 PM   [place] MAIN [facility] SERVICES [place], [name], [address] Phone: [phone]   Fax:  [phone]  Name: KEVONNA NOLTE MRN: [id number] Date of Birth: 04-27-1946

## 2021-08-08 NOTE — Patient Instructions (Signed)

## 2021-08-23 ENCOUNTER — Ambulatory Visit (INDEPENDENT_AMBULATORY_CARE_PROVIDER_SITE_OTHER): Payer: Medicare Other

## 2021-08-23 ENCOUNTER — Other Ambulatory Visit: Payer: Self-pay

## 2021-08-23 DIAGNOSIS — E538 Deficiency of other specified B group vitamins: Secondary | ICD-10-CM | POA: Diagnosis not present

## 2021-08-23 DIAGNOSIS — Z23 Encounter for immunization: Secondary | ICD-10-CM

## 2021-08-23 MED ORDER — CYANOCOBALAMIN 1000 MCG/ML IJ SOLN
1000.0000 ug | Freq: Once | INTRAMUSCULAR | Status: AC
Start: 2021-08-23 — End: 2021-08-23
  Administered 2021-08-23: 1000 ug via INTRAMUSCULAR

## 2021-08-27 ENCOUNTER — Ambulatory Visit (INDEPENDENT_AMBULATORY_CARE_PROVIDER_SITE_OTHER): Payer: Medicare Other

## 2021-08-27 DIAGNOSIS — Z8673 Personal history of transient ischemic attack (TIA), and cerebral infarction without residual deficits: Secondary | ICD-10-CM

## 2021-08-27 LAB — CUP PACEART REMOTE DEVICE CHECK
Date Time Interrogation Session: 20221114235513
Implantable Pulse Generator Implant Date: 20190328

## 2021-08-29 ENCOUNTER — Encounter (INDEPENDENT_AMBULATORY_CARE_PROVIDER_SITE_OTHER): Payer: Self-pay | Admitting: Nurse Practitioner

## 2021-08-29 ENCOUNTER — Ambulatory Visit (INDEPENDENT_AMBULATORY_CARE_PROVIDER_SITE_OTHER): Payer: Medicare Other | Admitting: Nurse Practitioner

## 2021-08-29 ENCOUNTER — Other Ambulatory Visit: Payer: Self-pay

## 2021-08-29 ENCOUNTER — Other Ambulatory Visit (INDEPENDENT_AMBULATORY_CARE_PROVIDER_SITE_OTHER): Payer: Self-pay | Admitting: Nurse Practitioner

## 2021-08-29 ENCOUNTER — Ambulatory Visit (INDEPENDENT_AMBULATORY_CARE_PROVIDER_SITE_OTHER): Payer: Medicare Other

## 2021-08-29 VITALS — BP 142/81 | HR 87 | Ht 69.0 in | Wt 210.0 lb

## 2021-08-29 DIAGNOSIS — I714 Abdominal aortic aneurysm, without rupture, unspecified: Secondary | ICD-10-CM

## 2021-08-29 DIAGNOSIS — I890 Lymphedema, not elsewhere classified: Secondary | ICD-10-CM | POA: Diagnosis not present

## 2021-08-29 DIAGNOSIS — E1169 Type 2 diabetes mellitus with other specified complication: Secondary | ICD-10-CM

## 2021-08-29 DIAGNOSIS — E669 Obesity, unspecified: Secondary | ICD-10-CM

## 2021-08-29 DIAGNOSIS — L03119 Cellulitis of unspecified part of limb: Secondary | ICD-10-CM

## 2021-08-29 NOTE — Progress Notes (Signed)
Subjective:    Patient ID: Sharon [name], female    DOB: Jun 25, 1946, 75 y.o.   MRN: [id number] Chief Complaint  Patient presents with   Follow-up    [place] is a 75 year old female that returns to the office for surveillance of a known abdominal aortic aneurysm. Patient denies abdominal pain or back pain, no other abdominal complaints. No changes suggesting embolic episodes.   There have been no interval changes in the patient's overall health care since his last visit.  She has known lymphedema as well and her swelling is generally well controlled currently.  No recurrent episodes of cellulitis  Patient denies amaurosis fugax or TIA symptoms. There is no history of claudication or rest pain symptoms of the lower extremities. The patient denies angina or shortness of breath.   Duplex US of the aorta and iliac arteries shows an AAA measured 3.20 cm which is decreased from previous reading of 3.8 cm on 02/26/2021.  It was noted that this exam was somewhat limited due to bowel gas iliac arteries were not able to be visualized.   Review of Systems  Cardiovascular:  Positive for leg swelling.  All other systems reviewed and are negative.     Objective:   Physical Exam Vitals reviewed.  HENT:     Head: Normocephalic.  Cardiovascular:     Rate and Rhythm: Normal rate.     Pulses: Normal pulses.  Pulmonary:     Effort: Pulmonary effort is normal.  Musculoskeletal:     Right lower leg: Edema present.     Left lower leg: Edema present.  Skin:    General: Skin is warm and dry.  Neurological:     Mental Status: She is alert and oriented to person, place, and time.  Psychiatric:        Mood and Affect: Mood normal.        Behavior: Behavior normal.        Thought Content: Thought content normal.        Judgment: Judgment normal.    BP (!) 142/81   Pulse 87   Ht _0  (1.753 m)   Wt 210 lb (95.3 kg)   LMP  (LMP Unknown)   BMI 31.01 kg/m   Past Medical  History:  Diagnosis Date   Anxiety    Arthritis    hands, upper back   Asthma    COPD (chronic obstructive pulmonary disease) (HCC)    History of cervical cancer    Menopausal disorder    Osteoporosis    Pneumonia 1960   Spasm of abdominal muscles of right side    intermittent   Stroke ([place]) 2019   TMJ (dislocation of temporomandibular joint)    Wears dentures    partial lower    Social History   Socioeconomic History   Marital status: Married    Spouse name: Lesta Limbert   Number of children: 1   Years of education: Not on file   Highest education level: Some college, no degree  Occupational History   Occupation: retired  Tobacco Use   Smoking status: Former    Years: 56.00    Types: Cigarettes    Quit date: 07/14/2016    Years since quitting: 5.1   Smokeless tobacco: Never  Vaping Use   Vaping Use: Former  Substance and Sexual Activity   Alcohol use: Yes    Alcohol/week: 0.0 standard drinks    Comment: occassional  Drug use: No   Sexual activity: Not Currently    Birth control/protection: Post-menopausal  Other Topics Concern   Not on file  Social History Narrative   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, manages farm   Social Determinants of Health   Financial Resource Strain: Low Risk    Difficulty of Paying Living Expenses: Not hard at all  Food Insecurity: No Food Insecurity   Worried About Charity fundraiser in the Last Year: Never true   Arboriculturist in the Last Year: Never true  Transportation Needs: No Transportation Needs   Lack of Transportation (Medical): No   Lack of Transportation (Non-Medical): No  Physical Activity: Inactive   Days of Exercise per Week: 0 days   Minutes of Exercise per Session: 0 min  Stress: Stress Concern Present   Feeling of Stress : Rather much  Social Connections: Not on file  Intimate Partner Violence: Not on file    Past Surgical History:  Procedure Laterality Date   ABDOMINAL HYSTERECTOMY  1970's   bladder botox  2005    BLADDER SUSPENSION  2004   CATARACT EXTRACTION W/PHACO Right 03/09/2018   Procedure: CATARACT EXTRACTION PHACO AND INTRAOCULAR LENS PLACEMENT ([place]) right;  Surgeon: Eulogio Bear, MD;  Location: [place];  Service: Ophthalmology;  Laterality: Right;  CALL CELL 1ST   CATARACT EXTRACTION W/PHACO Left 04/19/2018   Procedure: CATARACT EXTRACTION PHACO AND INTRAOCULAR LENS PLACEMENT ([facility])  LEFT;  Surgeon: Eulogio Bear, MD;  Location: [place];  Service: Ophthalmology;  Laterality: Left;   COLONOSCOPY WITH PROPOFOL N/A 12/13/2015   Procedure: COLONOSCOPY WITH PROPOFOL;  Surgeon: Lucilla Lame, MD;  Location: [place];  Service: Endoscopy;  Laterality: N/A;   COLONOSCOPY WITH PROPOFOL N/A 03/14/2021   Procedure: COLONOSCOPY WITH PROPOFOL;  Surgeon: Lucilla Lame, MD;  Location: [place];  Service: Endoscopy;  Laterality: N/A;  diabetic   LOOP RECORDER INSERTION N/A 01/07/2018   Procedure: LOOP RECORDER INSERTION;  Surgeon: Deboraha Sprang, MD;  Location: [place] [facility];  Service: Cardiovascular;  Laterality: N/A;   POLYPECTOMY N/A 12/13/2015   Procedure: POLYPECTOMY;  Surgeon: Lucilla Lame, MD;  Location: [place];  Service: Endoscopy;  Laterality: N/A;  SIGMOID COLON POLYPS X  5   POLYPECTOMY N/A 03/14/2021   Procedure: POLYPECTOMY;  Surgeon: Lucilla Lame, MD;  Location: [place];  Service: Endoscopy;  Laterality: N/A;   SHOULDER ARTHROSCOPY W/ ROTATOR CUFF REPAIR Right 1998   TEE WITHOUT CARDIOVERSION N/A 01/06/2018   Procedure: TRANSESOPHAGEAL ECHOCARDIOGRAM (TEE);  Surgeon: Minna Merritts, MD;  Location: [facility];  Service: Cardiovascular;  Laterality: N/A;   TONSILLECTOMY AND ADENOIDECTOMY      Family History  Problem Relation Age of Onset   Diabetes Mother    Heart disease Mother    Stroke Mother    Stroke Maternal Grandmother     Allergies  Allergen Reactions   Statins Other (See Comments)    myalgia   Quinolones      FLUOROQUINOLONES - Pt reports she was told to NEVER take these.   Baclofen Swelling   Bactrim [Sulfamethoxazole-Trimethoprim] Nausea And Vomiting   Ibuprofen Rash    Mouth swelling   Latex Rash    Some bandaids, some gloves; BLOOD TEST NEGATIVE   Librium [Chlordiazepoxide] Itching    Dizziness    Naprosyn [Naproxen] Rash    Mouth swelling   Other Rash    [place] nuts - mouth swelling    CBC Latest  Ref Rng & Units 06/06/2021 07/17/2020 01/26/2020  WBC 4.0 - 10.5 K/uL 13.6(H) 7.2 9.0  Hemoglobin 12.0 - 15.0 g/dL 14.2 14.4 15.0  Hematocrit 36.0 - 46.0 % 41.8 43.6 43.6  Platelets 150 - 400 K/uL 209 201 201      CMP     Component Value Date/Time   NA 136 06/06/2021 1443   NA 143 01/15/2021 1429   K 3.7 06/06/2021 1443   CL 103 06/06/2021 1443   CO2 23 06/06/2021 1443   GLUCOSE 144 (H) 06/06/2021 1443   BUN 11 06/06/2021 1443   BUN 14 01/15/2021 1429   CREATININE 0.94 06/06/2021 1443   CALCIUM 8.5 (L) 06/06/2021 1443   PROT 6.7 01/15/2021 1429   ALBUMIN 4.4 01/15/2021 1429   AST 20 01/15/2021 1429   ALT 20 01/15/2021 1429   ALKPHOS 74 01/15/2021 1429   BILITOT 0.3 01/15/2021 1429   GFRNONAA >60 06/06/2021 1443   GFRAA 67 07/17/2020 1509     No results found.     Assessment & Plan:   1. Abdominal aortic aneurysm (AAA) without rupture, unspecified part No surgery or intervention at this time. The patient has an asymptomatic abdominal aortic aneurysm that is less than 4 cm in maximal diameter.  I have discussed the natural history of abdominal aortic aneurysm and the small risk of rupture for aneurysm less than 5 cm in size.  However, as these small aneurysms tend to enlarge over time, continued surveillance with ultrasound or CT scan is mandatory.  I have also discussed optimizing medical management with hypertension and lipid control and the importance of abstinence from tobacco.  The patient is also encouraged to exercise a minimum of 30 minutes 4 times a week.   Should the patient develop new onset abdominal or back pain or signs of peripheral embolization they are instructed to seek medical attention immediately and to alert the physician providing care that they have an aneurysm.  The patient voices their understanding. The patient will return in 6 6 months with an aortic duplex per patient preference.  2. Lymphedema Currently well controlled patient is advised to continue with conservative therapy tactics including use of medical grade compression stockings daily, elevation and activity.  She should also continue to use her lymphedema pump on a nightly basis.  3. Type 2 diabetes mellitus with obesity ([place]) Continue hypoglycemic medications as already ordered, these medications have been reviewed and there are no changes at this time.  Hgb A1C to be monitored as already arranged by primary service   4. Recurrent cellulitis of lower extremity Currently patient is on prophylactic penicillin which has been working well for her and she has not had any side effects.  Patient will continue with penicillin as prescribed.   Current Outpatient Medications on File Prior to Visit  Medication Sig Dispense Refill   acetaminophen (TYLENOL) 500 MG tablet Take 500 mg by mouth every 6 (six) hours as needed.     Alirocumab (PRALUENT) 150 MG/ML SOAJ Inject 1 mL into the skin every 14 (fourteen) days. 6 mL 1   Blood Glucose Monitoring Suppl (ONETOUCH VERIO) w/Device KIT Utilize to check blood sugar twice a day, fasting in morning with goal < 130 and then 2 hours after a meal with goal <180.  Document and bring to visits. 1 kit 0   budesonide-formoterol (SYMBICORT) 160-4.5 MCG/ACT inhaler INHALE 2 PUFFS BY MOUTH TWICE DAILY 10.2 g 6   Cholecalciferol 1.25 MG (50000 UT) TABS Take 1 tablet by  mouth once a week. 14 tablet 4   citalopram (CELEXA) 20 MG tablet Take 20 mg by mouth as needed.     clopidogrel (PLAVIX) 75 MG tablet TAKE 1 TABLET(75 MG) BY MOUTH DAILY 90  tablet 4   Cyanocobalamin 1000 MCG/ML KIT Inject 1,000 mcg as directed every 30 (thirty) days.     furosemide (LASIX) 20 MG tablet Take 0.5 tablets (10 mg total) by mouth daily. 30 tablet 3   glucose blood (ONETOUCH VERIO) test strip Utilize to check blood sugar twice a day, fasting in morning with goal < 130 and then 2 hours after a meal with goal <180.  Document and bring to visits. 100 each 12   Lancets (ONETOUCH ULTRASOFT) lancets Utilize to check blood sugar twice a day, fasting in morning with goal < 130 and then 2 hours after a meal with goal <180.  Document and bring to visits. 100 each 12   metFORMIN (GLUCOPHAGE) 500 MG tablet TAKE 1 TABLET(500 MG) BY MOUTH DAILY WITH BREAKFAST 90 tablet 0   mometasone (ELOCON) 0.1 % cream APPLY TO LOWER LEGS TWICE DAILY UNTIL RASH IMPROVED. AVOID FACE, GROIN, UNDERARMS 45 g 0   pantoprazole (PROTONIX) 40 MG tablet TAKE 1 TABLET(40 MG) BY MOUTH DAILY 90 tablet 0   penicillin v potassium (VEETID) 250 MG tablet Take by mouth.     pimecrolimus (ELIDEL) 1 % cream Apply topically as directed. Qd to bid aa rash on lower legs 60 g 4   PROAIR HFA 108 (90 Base) MCG/ACT inhaler INHALE 2 PUFFS INTO THE LUNGS EVERY 6 HOURS AS NEEDED FOR WHEEZING OR SHORTNESS OF BREATH 8.5 g 2   tacrolimus (PROTOPIC) 0.1 % ointment Apply to lower legs 1-2 times a day until rash improved. 100 g 2   No current facility-administered medications on file prior to visit.    There are no Patient Instructions on file for this visit. No follow-ups on file.   Kris Hartmann, NP

## 2021-09-04 NOTE — Progress Notes (Signed)
Carelink Summary Report / Loop Recorder 

## 2021-09-09 ENCOUNTER — Ambulatory Visit: Payer: Medicare Other | Attending: Dermatology | Admitting: Occupational Therapy

## 2021-09-09 ENCOUNTER — Other Ambulatory Visit: Payer: Self-pay

## 2021-09-09 DIAGNOSIS — I890 Lymphedema, not elsewhere classified: Secondary | ICD-10-CM | POA: Diagnosis not present

## 2021-09-10 ENCOUNTER — Encounter: Payer: Self-pay | Admitting: Nurse Practitioner

## 2021-09-10 ENCOUNTER — Ambulatory Visit (INDEPENDENT_AMBULATORY_CARE_PROVIDER_SITE_OTHER): Payer: Medicare Other | Admitting: Nurse Practitioner

## 2021-09-10 VITALS — BP 120/73 | HR 87 | Temp 98.5°F | Ht 68.0 in | Wt 211.4 lb

## 2021-09-10 DIAGNOSIS — N1831 Chronic kidney disease, stage 3a: Secondary | ICD-10-CM | POA: Diagnosis not present

## 2021-09-10 DIAGNOSIS — E559 Vitamin D deficiency, unspecified: Secondary | ICD-10-CM | POA: Diagnosis not present

## 2021-09-10 DIAGNOSIS — E6609 Other obesity due to excess calories: Secondary | ICD-10-CM

## 2021-09-10 DIAGNOSIS — F419 Anxiety disorder, unspecified: Secondary | ICD-10-CM

## 2021-09-10 DIAGNOSIS — G720 Drug-induced myopathy: Secondary | ICD-10-CM

## 2021-09-10 DIAGNOSIS — E785 Hyperlipidemia, unspecified: Secondary | ICD-10-CM

## 2021-09-10 DIAGNOSIS — E1351 Other specified diabetes mellitus with diabetic peripheral angiopathy without gangrene: Secondary | ICD-10-CM

## 2021-09-10 DIAGNOSIS — D519 Vitamin B12 deficiency anemia, unspecified: Secondary | ICD-10-CM | POA: Diagnosis not present

## 2021-09-10 DIAGNOSIS — J432 Centrilobular emphysema: Secondary | ICD-10-CM

## 2021-09-10 DIAGNOSIS — E669 Obesity, unspecified: Secondary | ICD-10-CM

## 2021-09-10 DIAGNOSIS — D692 Other nonthrombocytopenic purpura: Secondary | ICD-10-CM

## 2021-09-10 DIAGNOSIS — M85852 Other specified disorders of bone density and structure, left thigh: Secondary | ICD-10-CM | POA: Diagnosis not present

## 2021-09-10 DIAGNOSIS — I7123 Aneurysm of the descending thoracic aorta, without rupture: Secondary | ICD-10-CM

## 2021-09-10 DIAGNOSIS — I700 Atherosclerosis of aorta: Secondary | ICD-10-CM | POA: Diagnosis not present

## 2021-09-10 DIAGNOSIS — E538 Deficiency of other specified B group vitamins: Secondary | ICD-10-CM

## 2021-09-10 DIAGNOSIS — Z0000 Encounter for general adult medical examination without abnormal findings: Secondary | ICD-10-CM | POA: Diagnosis not present

## 2021-09-10 DIAGNOSIS — K219 Gastro-esophageal reflux disease without esophagitis: Secondary | ICD-10-CM

## 2021-09-10 DIAGNOSIS — Z6832 Body mass index (BMI) 32.0-32.9, adult: Secondary | ICD-10-CM

## 2021-09-10 DIAGNOSIS — I890 Lymphedema, not elsewhere classified: Secondary | ICD-10-CM

## 2021-09-10 DIAGNOSIS — E1169 Type 2 diabetes mellitus with other specified complication: Secondary | ICD-10-CM | POA: Diagnosis not present

## 2021-09-10 LAB — BAYER DCA HB A1C WAIVED: HB A1C (BAYER DCA - WAIVED): 6.2 % — ABNORMAL HIGH (ref 4.8–5.6)

## 2021-09-10 LAB — MICROALBUMIN, URINE WAIVED
Creatinine, Urine Waived: 200 mg/dL (ref 10–300)
Microalb, Ur Waived: 10 mg/L (ref 0–19)
Microalb/Creat Ratio: <30 mg/g (ref <30)

## 2021-09-10 MED ORDER — CITALOPRAM HYDROBROMIDE 20 MG PO TABS: 20.0000 mg | ORAL_TABLET | ORAL | 4 refills | Status: DC | PRN

## 2021-09-10 MED ORDER — TIOTROPIUM BROMIDE-OLODATEROL 2.5-2.5 MCG/ACT IN AERS: 2.0000 | INHALATION_SPRAY | Freq: Every day | RESPIRATORY_TRACT | 4 refills | Status: DC

## 2021-09-10 NOTE — Assessment & Plan Note (Addendum)
Chronic, ongoing.  Change Symbicort to Stiolto to allow for LAMA/LABA combo, may assist with mucus control -- discussed at length with patient.  Obtain spirometry next visit. Continue to collaborate with CCM team.  Continue annual lung screening.  Return in 6 weeks.

## 2021-09-10 NOTE — Therapy (Signed)
[place] MAIN [facility] SERVICES [address] [place], [name], [address] Phone: [phone]   Fax:  [phone]  Occupational Therapy Treatment Note, Progress Report and Discharge Summary: Lymphedema Care   Patient Details  Name: Sharon [name] MRN: [id number] Date of Birth: 1946-05-29 No data recorded  Encounter Date: 09/09/2021   OT End of Session - 09/09/21 1413     Visit Number 50    Number of Visits 65    Date for OT Re-Evaluation 11/06/21    OT Start Time 0205    OT Stop Time 0305    OT Time Calculation (min) 60 min    Activity Tolerance Patient tolerated treatment well;No increased pain    Behavior During Therapy WFL for tasks assessed/performed             Past Medical History:  Diagnosis Date   Anxiety    Arthritis    hands, upper back   Asthma    COPD (chronic obstructive pulmonary disease) (HCC)    History of cervical cancer    Menopausal disorder    Osteoporosis    Pneumonia 1960   Spasm of abdominal muscles of right side    intermittent   Stroke ([place]) 2019   TMJ (dislocation of temporomandibular joint)    Wears dentures    partial lower    Past Surgical History:  Procedure Laterality Date   ABDOMINAL HYSTERECTOMY  1970's   bladder botox  2005   BLADDER SUSPENSION  2004   CATARACT EXTRACTION W/PHACO Right 03/09/2018   Procedure: CATARACT EXTRACTION PHACO AND INTRAOCULAR LENS PLACEMENT ([place]) right;  Surgeon: Eulogio Bear, MD;  Location: [place];  Service: Ophthalmology;  Laterality: Right;  CALL CELL 1ST   CATARACT EXTRACTION W/PHACO Left 04/19/2018   Procedure: CATARACT EXTRACTION PHACO AND INTRAOCULAR LENS PLACEMENT ([facility])  LEFT;  Surgeon: Eulogio Bear, MD;  Location: [place];  Service: Ophthalmology;  Laterality: Left;   COLONOSCOPY WITH PROPOFOL N/A 12/13/2015   Procedure: COLONOSCOPY WITH PROPOFOL;  Surgeon: Lucilla Lame, MD;  Location: [place];  Service:  Endoscopy;  Laterality: N/A;   COLONOSCOPY WITH PROPOFOL N/A 03/14/2021   Procedure: COLONOSCOPY WITH PROPOFOL;  Surgeon: Lucilla Lame, MD;  Location: [place];  Service: Endoscopy;  Laterality: N/A;  diabetic   LOOP RECORDER INSERTION N/A 01/07/2018   Procedure: LOOP RECORDER INSERTION;  Surgeon: Deboraha Sprang, MD;  Location: [place] [facility];  Service: Cardiovascular;  Laterality: N/A;   POLYPECTOMY N/A 12/13/2015   Procedure: POLYPECTOMY;  Surgeon: Lucilla Lame, MD;  Location: [place];  Service: Endoscopy;  Laterality: N/A;  SIGMOID COLON POLYPS X  5   POLYPECTOMY N/A 03/14/2021   Procedure: POLYPECTOMY;  Surgeon: Lucilla Lame, MD;  Location: [place];  Service: Endoscopy;  Laterality: N/A;   SHOULDER ARTHROSCOPY W/ ROTATOR CUFF REPAIR Right 1998   TEE WITHOUT CARDIOVERSION N/A 01/06/2018   Procedure: TRANSESOPHAGEAL ECHOCARDIOGRAM (TEE);  Surgeon: Minna Merritts, MD;  Location: [facility];  Service: Cardiovascular;  Laterality: N/A;   TONSILLECTOMY AND ADENOIDECTOMY      There were no vitals filed for this visit.   Subjective Assessment - 09/10/21 1226     Subjective  Mrs Kanady returns today for OT follow up visit 50/72 since commencing CDT to address BLE phlebo-lymphedema. Pt presents with off-the-shelf , ccl 1 (20-30 mmHg) compression knee highs in place bilaterally. Pt complains of L hip and knee pain. She endorses leg pain bilaterally,  but does not rate bilaterally.    Pertinent History Hx anxiety, COPD, OA, Asthma, hx cervical ca, osteoporosis, hx pneumonia, hx CVA, hx LLE cellulitis, hx L leg wound, hx falls    Limitations chronic BLE leg swelling and associated pain, decreased balance, fall risk, decreased standing, walking and sitting tolerance, difficulty w/ lower body bathing and dressing, difficulty fitting LB clothing and shoes, unable to drive, impaired transfers and functional mobility, impaired social participation, impaired ability to perform  household chores, cooking, meal prep    Repetition Increases Symptoms    Special Tests 11/20 dopler negative for DVT and CVI                 LYMPHEDEMA/ONCOLOGY QUESTIONNAIRE - 09/10/21 1230       Right Lower Extremity Lymphedema   Other RLE limb volume from ankle to tibial tuberosity = 3137.4 ml.    Other RLE A-D limb volume is DECreased 1.7 % since last measured on 02/07/21.Marland Kitchen      Left Lower Extremity Lymphedema   Other LLE (Rx limb) A-D volume measures 3161.5  ml.    Other LLE A-D limb volume is decreased by 0.4% since last measured on 02/07/21.                     OT Treatments/Exercises (OP) - 09/10/21 1229       ADLs   ADL Education Given Yes      Manual Therapy   Manual Therapy Edema management;Manual Lymphatic Drainage (MLD);Compression Bandaging    Edema Management BLE comparative limb volumetrics    Manual Lymphatic Drainage (MLD) MLD to LE as established utilizing short neck sequence ( clavicle only), functional inguinals and proximal to distalk  lower extremity segments to toes utilizing functional inguinal LN.    Compression Bandaging Max A to don compression stocking after manual therapy. Garments appear in excellent condition.                    OT Education - 09/10/21 1233     Education Details Continued Pt/ CG edu for lymphedema self care  and home program throughout session. Topics include multilayer, gradient compression wrapping, simple self-MLD, therapeutic lymphatic pumping exercises, skin/nail care, risk reduction factors and LE precautions, compression garments/recommendations and wear and care schedule and compression garment donning / doffing using assistive devices. All questions answered to the Pt's satisfaction, and Pt demonstrates understanding by report. Pt reminded to take diuretic, and all perscribed medications as directed. Pt advised not to DC a medication without discussing with perscribing provider    Person(s)  Educated Patient    Methods Explanation;Demonstration    Comprehension Verbalized understanding;Returned demonstration                 OT Long Term Goals - 09/10/21 1225       OT LONG TERM GOAL #1   Title Pt will demonstrate understanding of lymphedema (LE) precautions / prevention principals, including signs / symptoms of cellulitis infection with modified independence using LE Workbook as printed reference to identify 6 precautions without verbal cues by end of 3rd  OT Rx visit.     Time 3    Period Days    Status Achieved      OT LONG TERM GOAL #2   Title Pt will be able todon and doff existing compression garments independently and without assistive devices and/ or extra time for optimal lymphedema self management over time and to limit infection risk.  Baseline supervision    Time 4    Period Days    Status Achieved      OT LONG TERM GOAL #3   Title Pt to achieve at least  10% limb volume reduction in affected limb(s)  bilaterally during Intensive Phase CDT to control limb swelling, to improve tissue integrity and immune function, to improve ADLs performance and to improve functional mobility/ transfer, and to improve body image and self-esteem.    Baseline max A    Time 8    Period Weeks    Status Partially Met   RLE decreased 4.5% since 08/22/20. LLE stable with 0.9% increase snce 08/22/20.     OT LONG TERM GOAL #4   Title Pt will achieve 100% compliance with daily LE self-care home program components, including daily  skin care, simple self-MLD, gradient compression wraps/ compression garments/devices, and therapeutic exercise with Mod CG support to ensure optimal Intensive Phase limb volume reduction to expedite compression garment/ device fitting.    Baseline 50% compliant    Time 12    Period Weeks    Status Achieved   met by report     OT LONG TERM GOAL #5   Title Pt will use appropriate  assistive devices during LE self-care training to don compression  garments/devices with modified independence to ensure optimal LE self-management over time and to limit progression of chronic LE.    Baseline min A    Time 12    Period Weeks    Status Achieved   DC goal     OT LONG TERM GOAL #6   Title Pt will retain optimal limb volume reductions achieved during Intensive Phase CDT with no more than 3% volume increase with ongoing CG assistance to limit LE progression and further functional decline.    Baseline min A    Time 6    Period Months    Status Achieved   Met today with 1.3% reduction in LLE and 1.8% volume increase in R(dominant) LE since last visit on 12/27/20.                  Plan - 09/10/21 1147     Clinical Impression Statement Mrs. Hilmer was last seen on 08/08/21  for lymphedema follow along during self-management phase of CDT.  Pt denies falls during visit interval. Pt reports she is managing swelling with tools and strategies gained during OT and she feels her legs are doing well at present. Her compression garments worn today are in good condition. She alternates between several pairs and replaces them in a timely way without reminders.  Upon assessment  and physical exam leg swelling is well controlled and stable. Bilateral comparative volumetrics confirm this assessment with 1.7% limb volume reduction on the R and 0.4 % reduction on the left since last measured in 4/22.  Skin hydration is excellent. Redness associated with venoius insufficiency persists at anterior legs from mid shin to ankles. Signs/ symptoms of infection and/ or skin ulceration are absent. Spider vein formation at medial L knee appears slightly larger since last seen.  Pt c/o ongoing hip and knee pain.Discussed exploring joint injections with her PCP to see if she is a candidate. Mrs. Masy is managing BLE lymphedema well  overall. Skilled OT for lymphedema management is not indicated at this time.  Pt has met all OT treatment goals and is discharged this date.  We're happy to see her in the future for assistance as needed to address  chronic, progressive, BLE lymphedema PRN.    Occupational performance deficits (Please refer to evaluation for details): ADL's;Leisure;IADL's;Social Participation;Rest and Sleep;Work             Patient will benefit from skilled therapeutic intervention in order to improve the following deficits and impairments:           Visit Diagnosis: Lymphedema, not elsewhere classified - Plan: Ot plan of care cert/re-cert    Problem List Patient Active Problem List   Diagnosis Date Noted   History of 2019 novel coronavirus disease (COVID-19) 04/10/2021   Myalgia due to statin 03/18/2021   Personal history of colonic polyps    Rectal polyp    Peripheral vascular disease due to secondary diabetes ([place]) 01/11/2021   Osteopenia of neck of left femur 01/11/2021   Drug-induced myopathy 09/13/2020   Neuropathy of both feet 05/24/2020   Hyperlipidemia associated with type 2 diabetes mellitus ([place]) 02/14/2020   Obesity 02/14/2020   Vitamin D deficiency 06/28/2019   Type 2 diabetes mellitus with obesity ([place]) 06/28/2019   GERD (gastroesophageal reflux disease) 12/07/2018   Vitamin B12 deficiency 12/05/2018   Senile purpura ([place]) 07/28/2018   Lymphedema 07/14/2018   Aneurysm of descending thoracic aorta 05/04/2018   Allergic rhinitis 02/16/2018   Aortic atherosclerosis ([place]) 01/13/2018   Chronic kidney disease, stage 3 ([place]) 01/12/2018   History of CVA (cerebrovascular accident)    TIA (transient ischemic attack) 01/04/2018   CAD (coronary artery disease) 11/25/2017   Caregiver stress 11/25/2017   Menopause 08/14/2017   Stress incontinence 06/19/2017   Personal history of tobacco use, presenting hazards to health 04/01/2017   Anxiety 03/13/2017   Advanced care planning/counseling discussion 10/17/2016   Benign neoplasm of sigmoid colon    Centrilobular emphysema ([place]) 10/17/2015   Andrey Spearman, MS, OTR/L,  CLT-LANA 09/10/21 12:38 PM   [place] MAIN [facility] SERVICES [address] [place], [name], [address] Phone: [phone]   Fax:  [phone]  Name: Sharon [name] MRN: [id number] Date of Birth: 1946/05/23

## 2021-09-10 NOTE — Assessment & Plan Note (Addendum)
Chronic, ongoing.  Noted on CT scan 04/19/2019.  Continue Praluent for prevention and Plavix.  Monitor closely.

## 2021-09-10 NOTE — Assessment & Plan Note (Signed)
Chronic, stable with no decline on recent labs.  Renal dose medications as needed. CMP and urine ALB today -- urine ALB 10 on check.  For worsening would refer to nephrology.

## 2021-09-10 NOTE — Patient Instructions (Signed)

## 2021-09-10 NOTE — Progress Notes (Signed)
BP 120/73   Pulse 87   Temp 98.5 F (36.9 C) (Oral)   Ht _0  (1.727 m)   Wt 211 lb 6.4 oz (95.9 kg)   LMP  (LMP Unknown)   SpO2 97%   BMI 32.14 kg/m    Subjective:    Patient ID: Sharon [name], female    DOB: 1946-03-21, 75 y.o.   MRN: [id number]  HPI: Sharon [name] is a 75 y.o. female presenting on 09/10/2021 for comprehensive medical examination. Current medical complaints include:none  She currently lives with: husband Menopausal Symptoms: no   Continues on Protonix for GERD, which offers benefit to her heart burn symptoms.  DIABETES Continues on Metformin 500 MG daily.  April A1c 6.4%. Hypoglycemic episodes:no Polydipsia/polyuria: no Visual disturbance: no Chest pain: no Paresthesias: no Glucose Monitoring: yes  Accucheck frequency: Daily  Fasting glucose: 106-107 in morning  Post prandial:  Evening: 135-140   Before meals: Taking Insulin?: no  Long acting insulin:  Short acting insulin: Blood Pressure Monitoring: not checking Retinal Examination: Up to Date Foot Exam: Up to Date Pneumovax: Up to Date Influenza: Up To Date Aspirin: no   COPD Continues on Symbicort daily and Albuterol PRN.  Goes for annual lung screening with last on 08/01/21 noting mild centrilobular emphysema and aortic atherosclerosis.  She reports ongoing cough with mucus, feels the Symbicort does not help. COPD status: stable Satisfied with current treatment?: yes Oxygen use: no Dyspnea frequency: occasional Cough frequency: intermittent Rescue inhaler frequency:   Limitation of activity: no Productive cough: none Last Spirometry: unknown Pneumovax: Up to Date Influenza: Up to Date   CHRONIC KIDNEY DISEASE Recent labs stable. CKD status: stable Medications renally dose: yes Previous renal evaluation: no Pneumovax:  Up to Date Influenza Vaccine:  Up to Date   HYPERLIPIDEMIA Continues on Praluent, last saw cardiology 03/18/21 -- does have side effects with Praluent, has flu  like symptoms for one week and a rash at injection site -- she wishes to check levels before stopping this.  Has an abdominal aortic ectatic that is < 4 cm, which is being monitored by vascular.  Has pacemaker in place and recent had check remotely on 08/26/21. Hyperlipidemia status: good compliance Satisfied with current treatment?  yes Side effects:  no Medication compliance: good compliance Supplements: none Aspirin:  no The ASCVD Risk score (Arnett DK, et al., 2019) failed to calculate for the following reasons:   The patient has a prior MI or stroke diagnosis Chest pain:  no Coronary artery disease:  no Family history CAD:  no Family history early CAD:  no   LYMPHEDEMA WITH CHRONIC VENOUS INSUFFICIENCY: Saw vascular last 08/29/21.  Continues to go to massage therapist which she reports benefit from + using massage pump -- last massage therapy visit was 09/09/21 -- per recent notes it is felt she does not need OT any further, they are recommending joint injections.  Does endorses difficulty being off feet, due to caring for husband who receives dialysis 3 times a week.  Does take Tylenol for pain, which benefits at times.     VITAMIN B12 DEFICIENCY: Continues on monthly injections.  Last level in April was >2000.  Has Vitamin D deficiency, continues supplement with recent level 73.1.   DEPRESSION Continues on Celexa 20 MG as needed for mood, uses for when driving down highway -- reports this helps on a PRN basis. Mood status: stable Satisfied with current treatment?: yes Symptom severity: mild  Duration of current treatment :  chronic Side effects: no Medication compliance: good compliance Psychotherapy/counseling: none Depressed mood: no Anxious mood: no Anhedonia: no Significant weight loss or gain: no Insomnia: none Fatigue: no Feelings of worthlessness or guilt: no Impaired concentration/indecisiveness: no Suicidal ideations: no Hopelessness: no Crying spells:  no Depression screen [facility] 2/9 09/10/2021 04/19/2021 07/17/2020 04/09/2020  Decreased Interest 0 0 0 0  Down, Depressed, Hopeless 0 0 0 0  PHQ - 2 Score 0 0 0 0  Altered sleeping 0 - 1 -  Tired, decreased energy 1 - 1 -  Change in appetite 0 - 0 -  Feeling bad or failure about yourself  0 - 0 -  Trouble concentrating 0 - 0 -  Moving slowly or fidgety/restless 0 - 0 -  Suicidal thoughts 0 - 0 -  PHQ-9 Score 1 - 2 -  Difficult doing work/chores Not difficult at all - Not difficult at all -  Some recent data might be hidden   The patient does not have a history of falls. I did not complete a risk assessment for falls. A plan of care for falls was not documented.   Past Medical History:  Past Medical History:  Diagnosis Date   Anxiety    Arthritis    hands, upper back   Asthma    COPD (chronic obstructive pulmonary disease) ([place])    History of cervical cancer    Menopausal disorder    Osteoporosis    Pneumonia 1960   Spasm of abdominal muscles of right side    intermittent   Stroke ([place]) 2019   TMJ (dislocation of temporomandibular joint)    Wears dentures    partial lower    Surgical History:  Past Surgical History:  Procedure Laterality Date   ABDOMINAL HYSTERECTOMY  1970's   bladder botox  2005   BLADDER SUSPENSION  2004   CATARACT EXTRACTION W/PHACO Right 03/09/2018   Procedure: CATARACT EXTRACTION PHACO AND INTRAOCULAR LENS PLACEMENT ([place]) right;  Surgeon: Eulogio Bear, MD;  Location: [place];  Service: Ophthalmology;  Laterality: Right;  CALL CELL 1ST   CATARACT EXTRACTION W/PHACO Left 04/19/2018   Procedure: CATARACT EXTRACTION PHACO AND INTRAOCULAR LENS PLACEMENT ([facility])  LEFT;  Surgeon: Eulogio Bear, MD;  Location: [place];  Service: Ophthalmology;  Laterality: Left;   COLONOSCOPY WITH PROPOFOL N/A 12/13/2015   Procedure: COLONOSCOPY WITH PROPOFOL;  Surgeon: Lucilla Lame, MD;  Location: [place];  Service: Endoscopy;  Laterality:  N/A;   COLONOSCOPY WITH PROPOFOL N/A 03/14/2021   Procedure: COLONOSCOPY WITH PROPOFOL;  Surgeon: Lucilla Lame, MD;  Location: [place];  Service: Endoscopy;  Laterality: N/A;  diabetic   LOOP RECORDER INSERTION N/A 01/07/2018   Procedure: LOOP RECORDER INSERTION;  Surgeon: Deboraha Sprang, MD;  Location: [place] [facility];  Service: Cardiovascular;  Laterality: N/A;   POLYPECTOMY N/A 12/13/2015   Procedure: POLYPECTOMY;  Surgeon: Lucilla Lame, MD;  Location: [place];  Service: Endoscopy;  Laterality: N/A;  SIGMOID COLON POLYPS X  5   POLYPECTOMY N/A 03/14/2021   Procedure: POLYPECTOMY;  Surgeon: Lucilla Lame, MD;  Location: [place];  Service: Endoscopy;  Laterality: N/A;   SHOULDER ARTHROSCOPY W/ ROTATOR CUFF REPAIR Right 1998   TEE WITHOUT CARDIOVERSION N/A 01/06/2018   Procedure: TRANSESOPHAGEAL ECHOCARDIOGRAM (TEE);  Surgeon: Minna Merritts, MD;  Location: [facility];  Service: Cardiovascular;  Laterality: N/A;   TONSILLECTOMY AND ADENOIDECTOMY      Medications:  Current Outpatient Medications on File Prior  to Visit  Medication Sig   acetaminophen (TYLENOL) 500 MG tablet Take 500 mg by mouth every 6 (six) hours as needed.   Alirocumab (PRALUENT) 150 MG/ML SOAJ Inject 1 mL into the skin every 14 (fourteen) days.   Blood Glucose Monitoring Suppl (ONETOUCH VERIO) w/Device KIT Utilize to check blood sugar twice a day, fasting in morning with goal < 130 and then 2 hours after a meal with goal <180.  Document and bring to visits.   Cholecalciferol 1.25 MG (50000 UT) TABS Take 1 tablet by mouth once a week.   clopidogrel (PLAVIX) 75 MG tablet TAKE 1 TABLET(75 MG) BY MOUTH DAILY   Cyanocobalamin 1000 MCG/ML KIT Inject 1,000 mcg as directed every 30 (thirty) days.   furosemide (LASIX) 20 MG tablet Take 0.5 tablets (10 mg total) by mouth daily.   glucose blood (ONETOUCH VERIO) test strip Utilize to check blood sugar twice a day, fasting in morning with goal < 130 and  then 2 hours after a meal with goal <180.  Document and bring to visits.   Lancets (ONETOUCH ULTRASOFT) lancets Utilize to check blood sugar twice a day, fasting in morning with goal < 130 and then 2 hours after a meal with goal <180.  Document and bring to visits.   loratadine (CLARITIN) 10 MG tablet Take 10 mg by mouth daily.   metFORMIN (GLUCOPHAGE) 500 MG tablet TAKE 1 TABLET(500 MG) BY MOUTH DAILY WITH BREAKFAST   mometasone (ELOCON) 0.1 % cream APPLY TO LOWER LEGS TWICE DAILY UNTIL RASH IMPROVED. AVOID FACE, GROIN, UNDERARMS   pantoprazole (PROTONIX) 40 MG tablet TAKE 1 TABLET(40 MG) BY MOUTH DAILY   penicillin v potassium (VEETID) 250 MG tablet Take by mouth.   pimecrolimus (ELIDEL) 1 % cream Apply topically as directed. Qd to bid aa rash on lower legs   PROAIR HFA 108 (90 Base) MCG/ACT inhaler INHALE 2 PUFFS INTO THE LUNGS EVERY 6 HOURS AS NEEDED FOR WHEEZING OR SHORTNESS OF BREATH   tacrolimus (PROTOPIC) 0.1 % ointment Apply to lower legs 1-2 times a day until rash improved.   No current facility-administered medications on file prior to visit.    Allergies:  Allergies  Allergen Reactions   Statins Other (See Comments)    myalgia   Quinolones     FLUOROQUINOLONES - Pt reports she was told to NEVER take these.   Baclofen Swelling   Bactrim [Sulfamethoxazole-Trimethoprim] Nausea And Vomiting   Ibuprofen Rash    Mouth swelling   Latex Rash    Some bandaids, some gloves; BLOOD TEST NEGATIVE   Librium [Chlordiazepoxide] Itching    Dizziness    Naprosyn [Naproxen] Rash    Mouth swelling   Other Rash    [place] nuts - mouth swelling    Social History:  Social History   Socioeconomic History   Marital status: Married    Spouse name: Amarea Macdowell   Number of children: 1   Years of education: Not on file   Highest education level: Some college, no degree  Occupational History   Occupation: retired  Tobacco Use   Smoking status: Former    Years: 56.00    Types:  Cigarettes    Quit date: 07/14/2016    Years since quitting: 5.1   Smokeless tobacco: Never  Vaping Use   Vaping Use: Former  Substance and Sexual Activity   Alcohol use: Yes    Alcohol/week: 0.0 standard drinks    Comment: occassional   Drug use: No   Sexual  activity: Not Currently    Birth control/protection: Post-menopausal  Other Topics Concern   Not on file  Social History Narrative   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, manages farm   Social Determinants of Health   Financial Resource Strain: Low Risk    Difficulty of Paying Living Expenses: Not hard at all  Food Insecurity: No Food Insecurity   Worried About Charity fundraiser in the Last Year: Never true   Arboriculturist in the Last Year: Never true  Transportation Needs: No Transportation Needs   Lack of Transportation (Medical): No   Lack of Transportation (Non-Medical): No  Physical Activity: Inactive   Days of Exercise per Week: 0 days   Minutes of Exercise per Session: 0 min  Stress: Stress Concern Present   Feeling of Stress : Rather much  Social Connections: Not on file  Intimate Partner Violence: Not on file   Social History   Tobacco Use  Smoking Status Former   Years: 56.00   Types: Cigarettes   Quit date: 07/14/2016   Years since quitting: 5.1  Smokeless Tobacco Never   Social History   Substance and Sexual Activity  Alcohol Use Yes   Alcohol/week: 0.0 standard drinks   Comment: occassional    Family History:  Family History  Problem Relation Age of Onset   Diabetes Mother    Heart disease Mother    Stroke Mother    Stroke Maternal Grandmother     Past medical history, surgical history, medications, allergies, family history and social history reviewed with patient today and changes made to appropriate areas of the chart.   Review of Systems - negative All other ROS negative except what is listed above and in the HPI.      Objective:    BP 120/73   Pulse 87   Temp 98.5 F (36.9 C) (Oral)    Ht _0  (1.727 m)   Wt 211 lb 6.4 oz (95.9 kg)   LMP  (LMP Unknown)   SpO2 97%   BMI 32.14 kg/m   Wt Readings from Last 3 Encounters:  09/10/21 211 lb 6.4 oz (95.9 kg)  08/29/21 210 lb (95.3 kg)  08/01/21 207 lb (93.9 kg)    Physical Exam Vitals and nursing note reviewed.  Constitutional:      General: She is awake. She is not in acute distress.    Appearance: She is well-developed and well-groomed. She is obese. She is not ill-appearing.  HENT:     Head: Normocephalic and atraumatic.     Right Ear: Hearing, tympanic membrane, ear canal and external ear normal. No drainage.     Left Ear: Hearing, tympanic membrane, ear canal and external ear normal. No drainage.     Nose: Nose normal.     Right Sinus: No maxillary sinus tenderness or frontal sinus tenderness.     Left Sinus: No maxillary sinus tenderness or frontal sinus tenderness.     Mouth/Throat:     Mouth: Mucous membranes are moist.     Pharynx: Oropharynx is clear. Uvula midline. No pharyngeal swelling, oropharyngeal exudate or posterior oropharyngeal erythema.  Eyes:     General: Lids are normal.        Right eye: No discharge.        Left eye: No discharge.     Extraocular Movements: Extraocular movements intact.     Conjunctiva/sclera: Conjunctivae normal.     Pupils: Pupils are equal, round, and reactive to light.  Visual Fields: Right eye visual fields normal and left eye visual fields normal.  Neck:     Thyroid: No thyromegaly.     Vascular: No carotid bruit.     Trachea: Trachea normal.  Cardiovascular:     Rate and Rhythm: Normal rate and regular rhythm.     Heart sounds: Normal heart sounds. No murmur heard.   No gallop.  Pulmonary:     Effort: Pulmonary effort is normal. No accessory muscle usage or respiratory distress.     Breath sounds: Normal breath sounds.  Chest:     Comments: Deferred at patient request Abdominal:     General: Bowel sounds are normal.     Palpations: Abdomen is soft. There  is no hepatomegaly or splenomegaly.     Tenderness: There is no abdominal tenderness.  Musculoskeletal:        General: Normal range of motion.     Cervical back: Normal range of motion and neck supple.     Right lower leg: No edema.     Left lower leg: No edema.  Lymphadenopathy:     Head:     Right side of head: No submental, submandibular, tonsillar, preauricular or posterior auricular adenopathy.     Left side of head: No submental, submandibular, tonsillar, preauricular or posterior auricular adenopathy.     Cervical: No cervical adenopathy.  Skin:    General: Skin is warm and dry.     Capillary Refill: Capillary refill takes less than 2 seconds.     Findings: No rash.     Comments: Compression hose in place  Neurological:     Mental Status: She is alert and oriented to person, place, and time.     Gait: Gait is intact.     Deep Tendon Reflexes: Reflexes are normal and symmetric.     Reflex Scores:      Brachioradialis reflexes are 2+ on the right side and 2+ on the left side.      Patellar reflexes are 2+ on the right side and 2+ on the left side. Psychiatric:        Attention and Perception: Attention normal.        Mood and Affect: Mood normal.        Speech: Speech normal.        Behavior: Behavior normal. Behavior is cooperative.        Thought Content: Thought content normal.        Judgment: Judgment normal.   Results for orders placed or performed in visit on 08/27/21  [facility] [place]  Result Value Ref Range   Date Time Interrogation Session [id number]    Pulse Generator Manufacturer MERM    Pulse Gen Model OEU23 Reveal LINQ    Pulse Gen Serial Number [facility]    Implantable Pulse Generator Type ICM/ILR    Implantable Pulse Generator Implant Date [id number]       Assessment & Plan:   Problem List Items Addressed This Visit       Cardiovascular and Mediastinum   Aneurysm of descending thoracic aorta     Followed by vascular, last CT scan showed no significant increase in size.  Due for CT scan lung yearly.  Continue to collaborate with vascular.      Aortic atherosclerosis (HCC)    Chronic, ongoing.  Noted on CT scan 04/19/2019.  Continue Praluent for prevention and Plavix.  Monitor closely.  Peripheral vascular disease due to secondary diabetes ([place])    Chronic, ongoing with lymphedema and PVD.  Continue to collaborate with vascular and current medication regimen.  A1c today.  Continue diet focus for diabetes control and once daily Metformin.      Relevant Orders   TSH   Senile purpura ([place])    On daily Plavix.  Recommend cleansing skin with gentle cleanser and use of daily lotion.  Monitor for skin breakdown and address if present.        Respiratory   Centrilobular emphysema (HCC)    Chronic, ongoing.  Change Symbicort to Stiolto to allow for LAMA/LABA combo, may assist with mucus control -- discussed at length with patient.  Obtain spirometry next visit. Continue to collaborate with CCM team.  Continue annual lung screening.  Return in 6 weeks.      Relevant Medications   Tiotropium Bromide-Olodaterol 2.5-2.5 MCG/ACT AERS   loratadine (CLARITIN) 10 MG tablet   Other Relevant Orders   CBC with Differential/Platelet     Digestive   GERD (gastroesophageal reflux disease)    Chronic, ongoing.  Continue Protonix as ordered and adjust as needed.  Mag level today.      Relevant Orders   Magnesium     Endocrine   Hyperlipidemia associated with type 2 diabetes mellitus (HCC)    Chronic, ongoing. Did not tolerate statins, even on low dosing.  Continue Praluent, but may need to adjust this too as having side effects with it.  Check lipid panel today and CMP.      Relevant Orders   Bayer DCA Hb A1c Waived   Comprehensive metabolic panel   Lipid Panel w/o Chol/HDL Ratio   Type 2 diabetes mellitus with obesity (HCC) - Primary    Chronic, ongoing with recent A1c 6.4%, recheck  today.  Continue Metformin 500 MG daily, which she is tolerating. Recommend she monitor BS daily at home.  Urine ALB (urine ALB 10 on check) recently, recheck today.  Return to office 6 months, sooner if medication changes are made.      Relevant Orders   Bayer DCA Hb A1c Waived   Comprehensive metabolic panel   TSH     Musculoskeletal and Integument   Drug-induced myopathy    Continue Praluent and check CMP and lipid panel today.  May need to adjust if ongoing side effects.      Osteopenia of neck of left femur    Noted on DEXA 08/05/2017.  Will plan repeat 08/05/2022.  Continue Vitamin D supplement daily and adequate calcium intake.  Check Vit D today.        Genitourinary   Chronic kidney disease, stage 3 (HCC)    Chronic, stable with no decline on recent labs.  Renal dose medications as needed. CMP and urine ALB today -- urine ALB 10 on check.  For worsening would refer to nephrology.      Relevant Orders   Microalbumin, Urine Waived   CBC with Differential/Platelet   Comprehensive metabolic panel   TSH     Other   Anxiety    Chronic, stable, only uses PRN.  Continue daily Citalopram and monitor.  Adjust dose or medication as needed, do not increase Celexa to 40 MG due to patient age >91.  Denies SI/HI.      Relevant Medications   citalopram (CELEXA) 20 MG tablet   Lymphedema    Chronic, ongoing.  Continue use of compression hose daily.  Continue collaboration with vascular team.  Appreciate their input.  Will discuss with them alternate locations for massage or OT therapy, as patient benefits from this and reports she was told she did not need to return to current location.  Return in 6 months or sooner if s/s infection.      Obesity    BMI 32.14.  Recommended eating smaller high protein, low fat meals more frequently and exercising 30 mins a day 5 times a week with a goal of 10-15lb weight loss in the next 3 months. Patient voiced their understanding and motivation to  adhere to these recommendations.       Vitamin B12 deficiency    Chronic, stable.  Continue monthly injections, recheck B12 level today.      Relevant Orders   Vitamin B12   Vitamin D deficiency    Ongoing.  Recheck Vitamin D level today.  Recommend continue daily Vitamin D3 supplement, 1000 units.      Relevant Orders   VITAMIN D 25 Hydroxy (Vit-D Deficiency, Fractures)   Other Visit Diagnoses     Encounter for annual physical exam       Annual physical today and health maintenance reviewed.        Follow up plan: Return in about 6 weeks (around 10/22/2021) for COPD with Stiolto added.   LABORATORY TESTING:  - Pap smear: not applicable  IMMUNIZATIONS:   - Tdap: Tetanus vaccination status reviewed: last tetanus booster within 10 years. - Influenza: Up to date - Pneumovax: Up to date - Prevnar: Up to date - HPV: Not applicable - Zostavax vaccine: Not Up To Date - Covid: Up To Date  SCREENING: -Mammogram: Up to date 12/03/20 - Colonoscopy: Up to date  - Bone Density: Up to date Osteopenia in 2018 October -Hearing Test: Not applicable  -Spirometry: Up to date   PATIENT COUNSELING:   Advised to take 1 mg of folate supplement per day if capable of pregnancy.   Sexuality: Discussed sexually transmitted diseases, partner selection, use of condoms, avoidance of unintended pregnancy  and contraceptive alternatives.   Advised to avoid cigarette smoking.  I discussed with the patient that most people either abstain from alcohol or drink within safe limits (<=14/week and <=4 drinks/occasion for males, <=7/weeks and <= 3 drinks/occasion for females) and that the risk for alcohol disorders and other health effects rises proportionally with the number of drinks per week and how often a drinker exceeds daily limits.  Discussed cessation/primary prevention of drug use and availability of treatment for abuse.   Diet: Encouraged to adjust caloric intake to maintain  or achieve  ideal body weight, to reduce intake of dietary saturated fat and total fat, to limit sodium intake by avoiding high sodium foods and not adding table salt, and to maintain adequate dietary potassium and calcium preferably from fresh fruits, vegetables, and low-fat dairy products.    Stressed the importance of regular exercise  Injury prevention: Discussed safety belts, safety helmets, smoke detector, smoking near bedding or upholstery.   Dental health: Discussed importance of regular tooth brushing, flossing, and dental visits.    NEXT PREVENTATIVE PHYSICAL DUE IN 1 YEAR. Return in about 6 weeks (around 10/22/2021) for COPD with Stiolto added.

## 2021-09-10 NOTE — Assessment & Plan Note (Signed)
Followed by vascular, last CT scan showed no significant increase in size.  Due for CT scan lung yearly.  Continue to collaborate with vascular.

## 2021-09-10 NOTE — Assessment & Plan Note (Signed)
Noted on DEXA 08/05/2017.  Will plan repeat 08/05/2022.  Continue Vitamin D supplement daily and adequate calcium intake.  Check Vit D today.

## 2021-09-10 NOTE — Assessment & Plan Note (Signed)
Chronic, ongoing.  Continue Protonix as ordered and adjust as needed.  Mag level today.

## 2021-09-10 NOTE — Patient Instructions (Signed)

## 2021-09-10 NOTE — Assessment & Plan Note (Signed)
Chronic, ongoing.  Continue use of compression hose daily.  Continue collaboration with vascular team.  Appreciate their input.  Will discuss with them alternate locations for massage or OT therapy, as patient benefits from this and reports she was told she did not need to return to current location.  Return in 6 months or sooner if s/s infection.

## 2021-09-10 NOTE — Assessment & Plan Note (Signed)
Chronic, ongoing with lymphedema and PVD.  Continue to collaborate with vascular and current medication regimen.  A1c today.  Continue diet focus for diabetes control and once daily Metformin.

## 2021-09-10 NOTE — Assessment & Plan Note (Signed)
On daily Plavix.  Recommend cleansing skin with gentle cleanser and use of daily lotion.  Monitor for skin breakdown and address if present. 

## 2021-09-10 NOTE — Assessment & Plan Note (Signed)
Chronic, stable, only uses PRN.  Continue daily Citalopram and monitor.  Adjust dose or medication as needed, do not increase Celexa to 40 MG due to patient age >57.  Denies SI/HI.

## 2021-09-10 NOTE — Assessment & Plan Note (Signed)
Chronic, ongoing with recent A1c 6.4%, recheck today.  Continue Metformin 500 MG daily, which she is tolerating. Recommend she monitor BS daily at home.  Urine ALB (urine ALB 10 on check) recently, recheck today.  Return to office 6 months, sooner if medication changes are made.

## 2021-09-10 NOTE — Assessment & Plan Note (Signed)
Continue Praluent and check CMP and lipid panel today.  May need to adjust if ongoing side effects.

## 2021-09-10 NOTE — Assessment & Plan Note (Signed)
BMI 32.14.  Recommended eating smaller high protein, low fat meals more frequently and exercising 30 mins a day 5 times a week with a goal of 10-15lb weight loss in the next 3 months. Patient voiced their understanding and motivation to adhere to these recommendations.

## 2021-09-10 NOTE — Assessment & Plan Note (Signed)
Chronic, stable.  Continue monthly injections, recheck B12 level today.

## 2021-09-10 NOTE — Assessment & Plan Note (Signed)
Chronic, ongoing. Did not tolerate statins, even on low dosing.  Continue Praluent, but may need to adjust this too as having side effects with it.  Check lipid panel today and CMP.

## 2021-09-10 NOTE — Assessment & Plan Note (Signed)
Ongoing.  Recheck Vitamin D level today.  Recommend continue daily Vitamin D3 supplement, 1000 units.

## 2021-09-11 LAB — CBC WITH DIFFERENTIAL/PLATELET
Basophils Absolute: 0 10*3/uL (ref 0.0–0.2)
Basos: 0 %
EOS (ABSOLUTE): 0.1 10*3/uL (ref 0.0–0.4)
Eos: 1 %
Hematocrit: 41.2 % (ref 34.0–46.6)
Hemoglobin: 13.8 g/dL (ref 11.1–15.9)
Immature Grans (Abs): 0 10*3/uL (ref 0.0–0.1)
Immature Granulocytes: 0 %
Lymphocytes Absolute: 1.8 10*3/uL (ref 0.7–3.1)
Lymphs: 22 %
MCH: 29.5 pg (ref 26.6–33.0)
MCHC: 33.5 g/dL (ref 31.5–35.7)
MCV: 88 fL (ref 79–97)
Monocytes Absolute: 0.7 10*3/uL (ref 0.1–0.9)
Monocytes: 9 %
Neutrophils Absolute: 5.4 10*3/uL (ref 1.4–7.0)
Neutrophils: 68 %
Platelets: 295 10*3/uL (ref 150–450)
RBC: 4.68 x10E6/uL (ref 3.77–5.28)
RDW: 12.3 % (ref 11.7–15.4)
WBC: 8 10*3/uL (ref 3.4–10.8)

## 2021-09-11 LAB — COMPREHENSIVE METABOLIC PANEL
ALT: 17 IU/L (ref 0–32)
AST: 19 IU/L (ref 0–40)
Albumin/Globulin Ratio: 1.6 (ref 1.2–2.2)
Albumin: 3.9 g/dL (ref 3.7–4.7)
Alkaline Phosphatase: 101 IU/L (ref 44–121)
BUN/Creatinine Ratio: 13 (ref 12–28)
BUN: 13 mg/dL (ref 8–27)
Bilirubin Total: 0.3 mg/dL (ref 0.0–1.2)
CO2: 24 mmol/L (ref 20–29)
Calcium: 9.1 mg/dL (ref 8.7–10.3)
Chloride: 104 mmol/L (ref 96–106)
Creatinine, Ser: 0.97 mg/dL (ref 0.57–1.00)
Globulin, Total: 2.5 g/dL (ref 1.5–4.5)
Glucose: 104 mg/dL — ABNORMAL HIGH (ref 70–99)
Potassium: 4.2 mmol/L (ref 3.5–5.2)
Sodium: 145 mmol/L — ABNORMAL HIGH (ref 134–144)
Total Protein: 6.4 g/dL (ref 6.0–8.5)
eGFR: 61 mL/min/{1.73_m2} (ref >59)

## 2021-09-11 LAB — LIPID PANEL W/O CHOL/HDL RATIO
Cholesterol, Total: 191 mg/dL (ref 100–199)
HDL: 49 mg/dL (ref >39)
LDL Chol Calc (NIH): 116 mg/dL — ABNORMAL HIGH (ref 0–99)
Triglycerides: 147 mg/dL (ref 0–149)
VLDL Cholesterol Cal: 26 mg/dL (ref 5–40)

## 2021-09-11 LAB — MAGNESIUM: Magnesium: 2.1 mg/dL (ref 1.6–2.3)

## 2021-09-11 LAB — VITAMIN D 25 HYDROXY (VIT D DEFICIENCY, FRACTURES): Vit D, 25-Hydroxy: 49.4 ng/mL (ref 30.0–100.0)

## 2021-09-11 LAB — TSH: TSH: 0.965 u[IU]/mL (ref 0.450–4.500)

## 2021-09-11 LAB — VITAMIN B12: Vitamin B-12: 1017 pg/mL (ref 232–1245)

## 2021-09-11 NOTE — Progress Notes (Signed)
Contacted via [place] afternoon [place], it was so good to see you, your labs have returned: - CBC is normal - CMP shows mild elevation in sodium, salt, level -- recommend monitoring your salt intake and cutting back where possible.  Kidney and liver function are normal. - Thyroid level normal - Cholesterol levels show LDL above goal, but trending down.  We may have to consider changing you from Praluent to [place], which may offer more benefit and less side effects for you. - Vitamin D, B12, and magnesium levels normal.  Any questions? Keep being amazing!!  Thank you for allowing me to participate in your care.  I appreciate you. Kindest regards, Bert Givans

## 2021-09-23 ENCOUNTER — Encounter: Payer: Self-pay | Admitting: Nurse Practitioner

## 2021-09-23 ENCOUNTER — Other Ambulatory Visit: Payer: Self-pay

## 2021-09-23 ENCOUNTER — Ambulatory Visit (INDEPENDENT_AMBULATORY_CARE_PROVIDER_SITE_OTHER): Payer: Medicare Other | Admitting: Nurse Practitioner

## 2021-09-23 VITALS — BP 134/83 | HR 91 | Wt 211.0 lb

## 2021-09-23 DIAGNOSIS — I890 Lymphedema, not elsewhere classified: Secondary | ICD-10-CM | POA: Diagnosis not present

## 2021-09-23 DIAGNOSIS — E538 Deficiency of other specified B group vitamins: Secondary | ICD-10-CM | POA: Diagnosis not present

## 2021-09-23 DIAGNOSIS — L03116 Cellulitis of left lower limb: Secondary | ICD-10-CM | POA: Insufficient documentation

## 2021-09-23 MED ORDER — CYANOCOBALAMIN 1000 MCG/ML IJ SOLN
1000.0000 ug | Freq: Once | INTRAMUSCULAR | Status: AC
  Administered 2021-09-23: 1000 ug via INTRAMUSCULAR

## 2021-09-23 MED ORDER — FUROSEMIDE 20 MG PO TABS: 10.0000 mg | ORAL_TABLET | Freq: Every day | ORAL | 4 refills | Status: DC

## 2021-09-23 MED ORDER — CEPHALEXIN 500 MG PO CAPS: 500.0000 mg | ORAL_CAPSULE | Freq: Four times a day (QID) | ORAL | 0 refills | Status: AC

## 2021-09-23 NOTE — Assessment & Plan Note (Signed)
Chronic, ongoing.  Continue use of compression hose daily.  Continue collaboration with vascular team.  Appreciate their input.  Will discuss with them alternate locations for massage or OT therapy, as patient benefits from this and reports she was told she did not need to return to current location.  Return in 6 months or sooner if s/s infection.

## 2021-09-23 NOTE — Assessment & Plan Note (Signed)
Chronic, stable.  Continue monthly injections.

## 2021-09-23 NOTE — Progress Notes (Signed)
BP 134/83   Pulse 91   Wt 211 lb (95.7 kg)   LMP  (LMP Unknown)   SpO2 98%   BMI 32.08 kg/m    Subjective:    Patient ID: Sharon [name], female    DOB: 1945/12/30, 75 y.o.   MRN: [id number]  HPI: Sharon [name] is a 75 y.o. female  Chief Complaint  Patient presents with   Lymphedema    Patient states she goes to treatment for her Lymphedema. Patient states she wasn't too concerned until she noticed the redness spread from her ankle to her shin. Patient states she is retaining a lot of fluids. Patient states she first noticed Friday she noticed some tightness. Patient states it is hot. Patient states she noticed she has not had her fluid tablet and she does not know how long she has not had it.    SKIN INFECTION Has underlying lymphedema at baseline, followed by vascular.  Has had multiple episodes of cellulitis.  Is currently on Penicillin at baseline daily, one in the morning and one in the evening.  Current symptoms started on Saturday.  Has taken Doxycycline in past with GI effects.  Keflex tolerated well and has helped with acute cellulitis. Sees dermatology tomorrow.  Had been on Lasix in past, but reports has been out of this and is unsure when she last took this.   Duration: days Location: left lower leg History of trauma in area: no Pain: yes Quality: yes Severity: 7/10 Redness: yes Swelling: yes Oozing: no Pus: no Fevers: no Nausea/vomiting: no Status: stable Treatments attempted:antibiotics  Tetanus: UTD   Relevant past medical, surgical, family and social history reviewed and updated as indicated. Interim medical history since our last visit reviewed. Allergies and medications reviewed and updated.  Review of Systems  Constitutional:  Negative for activity change, appetite change, diaphoresis, fatigue and fever.  Respiratory:  Negative for cough, chest tightness, shortness of breath and wheezing.   Cardiovascular:  Positive for leg swelling. Negative for  chest pain and palpitations.  Gastrointestinal: Negative.   Neurological: Negative.   Psychiatric/Behavioral: Negative.     Per HPI unless specifically indicated above     Objective:    BP 134/83   Pulse 91   Wt 211 lb (95.7 kg)   LMP  (LMP Unknown)   SpO2 98%   BMI 32.08 kg/m   Wt Readings from Last 3 Encounters:  09/23/21 211 lb (95.7 kg)  09/10/21 211 lb 6.4 oz (95.9 kg)  08/29/21 210 lb (95.3 kg)    Physical Exam Vitals and nursing note reviewed.  Constitutional:      General: She is awake. She is not in acute distress.    Appearance: She is well-developed and well-groomed. She is not ill-appearing or toxic-appearing.  HENT:     Head: Normocephalic.     Right Ear: Hearing normal.     Left Ear: Hearing normal.  Eyes:     General: Lids are normal.        Right eye: No discharge.        Left eye: No discharge.     Conjunctiva/sclera: Conjunctivae normal.  Cardiovascular:     Rate and Rhythm: Normal rate and regular rhythm.     Heart sounds: No murmur heard.   No gallop.  Pulmonary:     Effort: Pulmonary effort is normal. No accessory muscle usage or respiratory distress.     Breath sounds: Normal breath sounds.  Musculoskeletal:  Cervical back: Normal range of motion.     Right lower leg: 1+ Edema present.     Left lower leg: 2+ Edema present.  Skin:    Comments: Left lower extremity with erythema extending from dorsal aspect foot to upper calf.  Edema present BLE.  Warmth on palpation to area.  Tenderness to palpation.  Neurological:     Mental Status: She is alert and oriented to person, place, and time.  Psychiatric:        Attention and Perception: Attention normal.        Mood and Affect: Mood normal.        Behavior: Behavior normal. Behavior is cooperative.        Thought Content: Thought content normal.        Judgment: Judgment normal.   Results for orders placed or performed in visit on 09/10/21  Bayer DCA Hb A1c Waived  Result Value Ref Range    HB A1C (BAYER DCA - WAIVED) 6.2 (H) 4.8 - 5.6 %  Microalbumin, Urine Waived  Result Value Ref Range   Microalb, Ur Waived 10 0 - 19 mg/L   Creatinine, Urine Waived 200 10 - 300 mg/dL   Microalb/Creat Ratio <30 <30 mg/g  CBC with Differential/Platelet  Result Value Ref Range   WBC 8.0 3.4 - 10.8 x10E3/uL   RBC 4.68 3.77 - 5.28 x10E6/uL   Hemoglobin 13.8 11.1 - 15.9 g/dL   Hematocrit 41.2 34.0 - 46.6 %   MCV 88 79 - 97 fL   MCH 29.5 26.6 - 33.0 pg   MCHC 33.5 31.5 - 35.7 g/dL   RDW 12.3 11.7 - 15.4 %   Platelets 295 150 - 450 x10E3/uL   Neutrophils 68 Not Estab. %   Lymphs 22 Not Estab. %   Monocytes 9 Not Estab. %   Eos 1 Not Estab. %   Basos 0 Not Estab. %   Neutrophils Absolute 5.4 1.4 - 7.0 x10E3/uL   Lymphocytes Absolute 1.8 0.7 - 3.1 x10E3/uL   Monocytes Absolute 0.7 0.1 - 0.9 x10E3/uL   EOS (ABSOLUTE) 0.1 0.0 - 0.4 x10E3/uL   Basophils Absolute 0.0 0.0 - 0.2 x10E3/uL   Immature Granulocytes 0 Not Estab. %   Immature Grans (Abs) 0.0 0.0 - 0.1 x10E3/uL  Comprehensive metabolic panel  Result Value Ref Range   Glucose 104 (H) 70 - 99 mg/dL   BUN 13 8 - 27 mg/dL   Creatinine, Ser 0.97 0.57 - 1.00 mg/dL   eGFR 61 >59 mL/min/1.73   BUN/Creatinine Ratio 13 12 - 28   Sodium 145 (H) 134 - 144 mmol/L   Potassium 4.2 3.5 - 5.2 mmol/L   Chloride 104 96 - 106 mmol/L   CO2 24 20 - 29 mmol/L   Calcium 9.1 8.7 - 10.3 mg/dL   Total Protein 6.4 6.0 - 8.5 g/dL   Albumin 3.9 3.7 - 4.7 g/dL   Globulin, Total 2.5 1.5 - 4.5 g/dL   Albumin/Globulin Ratio 1.6 1.2 - 2.2   Bilirubin Total 0.3 0.0 - 1.2 mg/dL   Alkaline Phosphatase 101 44 - 121 IU/L   AST 19 0 - 40 IU/L   ALT 17 0 - 32 IU/L  TSH  Result Value Ref Range   TSH 0.965 0.450 - 4.500 uIU/mL  Lipid Panel w/o Chol/HDL Ratio  Result Value Ref Range   Cholesterol, Total 191 100 - 199 mg/dL   Triglycerides 147 0 - 149 mg/dL   HDL 49 >39 mg/dL  VLDL Cholesterol Cal 26 5 - 40 mg/dL   LDL Chol Calc (NIH) 116 (H) 0 - 99 mg/dL   VITAMIN D 25 Hydroxy (Vit-D Deficiency, Fractures)  Result Value Ref Range   Vit D, 25-Hydroxy 49.4 30.0 - 100.0 ng/mL  Vitamin B12  Result Value Ref Range   Vitamin B-12 1,017 232 - 1,245 pg/mL  Magnesium  Result Value Ref Range   Magnesium 2.1 1.6 - 2.3 mg/dL      Assessment & Plan:   Problem List Items Addressed This Visit       Other   Cellulitis of left leg - Primary    Left lower leg with moderate erythema and warmth + increased swelling and tenderness.  Will obtain vascular imaging to ensure no DVT.  Has underlying lymphedema at baseline.  Recommend she hold Penicillin at this time and start Keflex QID for 5 days, which has offered benefit during acute infection in past.  Did not tolerate Doxycycline.  Recommend applying Louretta [place], but she sees dermatology tomorrow and wishes to hold off so they can see leg.  Appreciate their input.  She is to monitor closely and alert if worsening symptoms.  Return in 3 days.      Relevant Orders   US Venous Img Lower Unilateral Left (DVT)   Lymphedema    Chronic, ongoing.  Continue use of compression hose daily.  Continue collaboration with vascular team.  Appreciate their input.  Will discuss with them alternate locations for massage or OT therapy, as patient benefits from this and reports she was told she did not need to return to current location.  Return in 6 months or sooner if s/s infection.      Relevant Orders   US Venous Img Lower Unilateral Left (DVT)   Vitamin B12 deficiency    Chronic, stable.  Continue monthly injections.        Follow up plan: Return in about 3 days (around 09/26/2021) for Cellulitis.

## 2021-09-23 NOTE — Assessment & Plan Note (Signed)
Left lower leg with moderate erythema and warmth + increased swelling and tenderness.  Will obtain vascular imaging to ensure no DVT.  Has underlying lymphedema at baseline.  Recommend she hold Penicillin at this time and start Keflex QID for 5 days, which has offered benefit during acute infection in past.  Did not tolerate Doxycycline.  Recommend applying Louretta Par[place], but she sees dermatology tomorrow and wishes to hold off so they can see leg.  Appreciate their input.  She is to monitor closely and alert if worsening symptoms.  Return in 3 days.

## 2021-09-23 NOTE — Patient Instructions (Signed)
Cellulitis, Adult Cellulitis is a skin infection. The infected area is often warm, red, swollen, and sore. It occurs most often in the arms and lower legs. It is very important to get treated for this condition. What are the causes? This condition is caused by bacteria. The bacteria enter through a break in the skin, such as a cut, burn, insect bite, open sore, or crack. What increases the risk? This condition is more likely to occur in people who: Have a weak body defense system (immune system). Have open cuts, burns, bites, or scrapes on the skin. Are older than 75 years of age. Have a blood sugar problem (diabetes). Have a long-lasting (chronic) liver disease (cirrhosis) or kidney disease. Are very overweight (obese). Have a skin problem, such as: Itchy rash (eczema). Slow movement of blood in the veins (venous stasis). Fluid buildup below the skin (edema). Have been treated with high-energy rays (radiation). Use IV drugs. What are the signs or symptoms? Symptoms of this condition include: Skin that is: Red. Streaking. Spotting. Swollen. Sore or painful when you touch it. Warm. A fever. Chills. Blisters. How is this diagnosed? This condition is diagnosed based on: Medical history. Physical exam. Blood tests. Imaging tests. How is this treated? Treatment for this condition may include: Medicines to treat infections or allergies. Home care, such as: Rest. Placing cold or warm cloths (compresses) on the skin. Hospital care, if the condition is very bad. Follow these instructions at home: Medicines Take over-the-counter and prescription medicines only as told by your doctor. If you were prescribed an antibiotic medicine, take it as told by your doctor. Do not stop taking it even if you start to feel better. General instructions  Drink enough fluid to keep your pee (urine) pale yellow. Do not touch or rub the infected area. Raise (elevate) the infected area above the  level of your heart while you are sitting or lying down. Place cold or warm cloths on the area as told by your doctor. Keep all follow-up visits as told by your doctor. This is important. Contact a doctor if: You have a fever. You do not start to get better after 1-2 days of treatment. Your bone or joint under the infected area starts to hurt after the skin has healed. Your infection comes back. This can happen in the same area or another area. You have a swollen bump in the area. You have new symptoms. You feel ill and have muscle aches and pains. Get help right away if: Your symptoms get worse. You feel very sleepy. You throw up (vomit) or have watery poop (diarrhea) for a long time. You see red streaks coming from the area. Your red area gets larger. Your red area turns dark in color. These symptoms may represent a serious problem that is an emergency. Do not wait to see if the symptoms will go away. Get medical help right away. Call your local emergency services (911 in the [place] Do not drive yourself to the hospital. Summary Cellulitis is a skin infection. The area is often warm, red, swollen, and sore. This condition is treated with medicines, rest, and cold and warm cloths. Take all medicines only as told by your doctor. Tell your doctor if symptoms do not start to get better after 1-2 days of treatment. This information is not intended to replace advice given to you by your health care provider. Make sure you discuss any questions you have with your health care provider. Document Revised: 02/18/2018 Document Reviewed:  02/18/2018 Elsevier Patient Education  2022 [employer].

## 2021-09-24 ENCOUNTER — Ambulatory Visit (INDEPENDENT_AMBULATORY_CARE_PROVIDER_SITE_OTHER): Payer: Medicare Other | Admitting: Dermatology

## 2021-09-24 ENCOUNTER — Ambulatory Visit
Admission: RE | Admit: 2021-09-24 | Discharge: 2021-09-24 | Disposition: A | Discharge status: Home / Self Care | Payer: Medicare Other | Source: Ambulatory Visit | Attending: Nurse Practitioner | Admitting: Nurse Practitioner

## 2021-09-24 DIAGNOSIS — L03116 Cellulitis of left lower limb: Secondary | ICD-10-CM | POA: Diagnosis not present

## 2021-09-24 DIAGNOSIS — I872 Venous insufficiency (chronic) (peripheral): Secondary | ICD-10-CM

## 2021-09-24 DIAGNOSIS — R600 Localized edema: Secondary | ICD-10-CM | POA: Diagnosis not present

## 2021-09-24 DIAGNOSIS — I890 Lymphedema, not elsewhere classified: Secondary | ICD-10-CM | POA: Insufficient documentation

## 2021-09-24 NOTE — Progress Notes (Signed)
Contacted via MyChart   No DVT noted on imaging!!  Great news!!!

## 2021-09-24 NOTE — Progress Notes (Signed)
° °  Follow-Up Visit   Subjective  Sharon [name] is a 75 y.o. female who presents for the following: Follow-up.  Patient here for 9 week follow-up Stasis Dermatitis. Legs had improved until over the weekend. Left leg became red, swollen, and painful. She saw [name], NP yesterday and she started her on Keflex 500mg  4x/day for 5 days. She also has not been on her fluid pill, Lasix. She is getting refill from pharmacy.   The following portions of the chart were reviewed this encounter and updated as appropriate:       Review of Systems:  No other skin or systemic complaints except as noted in HPI or Assessment and Plan.  Objective  Well appearing patient in no apparent distress; mood and affect are within normal limits.  A focused examination was performed including legs. Relevant physical exam findings are noted in the Assessment and Plan.  lower legs 2+ pitting edema of the left ankle; erythema with peau d'orange changes   Assessment & Plan  Stasis dermatitis of both legs lower legs  With recent flare.   Chronic condition.  Stasis in the legs causes chronic leg swelling, which may result in itchy or painful rashes, skin discoloration, skin texture changes, and sometimes ulceration.  Recommend daily compression hose/stockings- easiest to put on first thing in morning, remove at bedtime.  Elevate legs as much as possible. Avoid salt/sodium rich foods.  Restart mometasone cream BID until improved, then prn flares.  Continue Elidel cream or tacrolimus ointment qd/bid AA. Continue Keflex as previously prescribed.  Cont daily compression socks Cont lymphedema pump qd   Discussed [employer]. Not able to put on today due to imaging appointment of the leg later today. Patient will have put on at PCP office.   Topical steroids (such as triamcinolone, fluocinolone, fluocinonide, mometasone, clobetasol, halobetasol, betamethasone, hydrocortisone) can cause thinning and lightening of  the skin if they are used for too long in the same area. Your physician has selected the right strength medicine for your problem and area affected on the body. Please use your medication only as directed by your physician to prevent side effects.    Related Medications pimecrolimus (ELIDEL) 1 % cream Apply topically as directed. Qd to bid aa rash on lower legs  Return in about 4 weeks (around 10/22/2021) for stasis dermatitis.  IJamesetta Orleans, CMA, am acting as scribe for Brendolyn Patty, MD .  Documentation: I have reviewed the above documentation for accuracy and completeness, and I agree with the above.  Brendolyn Patty MD

## 2021-09-24 NOTE — Patient Instructions (Addendum)
Restart mometasone cream twice a day to left lower leg until improved or until [employer] applied. Use as needed for flares. Avoid face, groin, underarms.  Topical steroids (such as triamcinolone, fluocinolone, fluocinonide, mometasone, clobetasol, halobetasol, betamethasone, hydrocortisone) can cause thinning and lightening of the skin if they are used for too long in the same area. Your physician has selected the right strength medicine for your problem and area affected on the body. Please use your medication only as directed by your physician to prevent side effects.   Continue pimecrolimus cream or tacrolimus ointment 1-2 times a day as needed.  Restart Lasix as prescribed.   If You Need Anything After Your Visit  If you have any questions or concerns for your doctor, please call our main line at [phone] and press option 4 to reach your doctor's medical assistant. If no one answers, please leave a voicemail as directed and we will return your call as soon as possible. Messages left after 4 pm will be answered the following business day.   You may also send [place] a message via [place]. We typically respond to MyChart messages within 1-2 business days.  For prescription refills, please ask your pharmacy to contact our office. Our fax number is [phone].  If you have an urgent issue when the clinic is closed that cannot wait until the next business day, you can page your doctor at the number below.    Please note that while we do our best to be available for urgent issues outside of office hours, we are not available 24/7.   If you have an urgent issue and are unable to reach [place], you may choose to seek medical care at your doctor's office, retail clinic, urgent care center, or emergency room.  If you have a medical emergency, please immediately call 911 or go to the emergency department.  Pager Numbers  - Dr. Nehemiah Massed: [phone]  - Dr. Laurence Ferrari: [phone]  - Dr. Nicole Kindred:  [phone]  In the event of inclement weather, please call our main line at [phone] for an update on the status of any delays or closures.  Dermatology Medication Tips: Please keep the boxes that topical medications come in in order to help keep track of the instructions about where and how to use these. Pharmacies typically print the medication instructions only on the boxes and not directly on the medication tubes.   If your medication is too expensive, please contact our office at [phone] option 4 or send [place] a message through [place].   We are unable to tell what your co-pay for medications will be in advance as this is different depending on your insurance coverage. However, we may be able to find a substitute medication at lower cost or fill out paperwork to get insurance to cover a needed medication.   If a prior authorization is required to get your medication covered by your insurance company, please allow [place] 1-2 business days to complete this process.  Drug prices often vary depending on where the prescription is filled and some pharmacies may offer cheaper prices.  The website www.goodrx.com contains coupons for medications through different pharmacies. The prices here do not account for what the cost may be with help from insurance (it may be cheaper with your insurance), but the website can give you the price if you did not use any insurance.  - You can print the associated coupon and take it with your prescription to the pharmacy.  - You may also  stop by our office during regular business hours and pick up a GoodRx coupon card.  - If you need your prescription sent electronically to a different pharmacy, notify our office through [facility] or by phone at [phone] option 4.     Si Usted Necesita Algo Despus de Su Visita  Tambin puede enviarnos un mensaje a travs de Pharmacist, community. Por lo general respondemos a los mensajes de MyChart en el transcurso de 1 a 2  das hbiles.  Para renovar recetas, por favor pida a su farmacia que se ponga en contacto con nuestra oficina. Harland Dingwall de fax es [place] [phone].  Si tiene un asunto urgente cuando la clnica est cerrada y que no puede esperar hasta el siguiente da hbil, puede llamar/localizar a su doctor(a) al nmero que aparece a continuacin.   Por favor, tenga en cuenta que aunque hacemos todo lo posible para estar disponibles para asuntos urgentes fuera del horario de [place], no estamos disponibles las 24 horas del da, los 7 das de la [place].   Si tiene un problema urgente y no puede comunicarse con nosotros, puede optar por buscar atencin mdica  en el consultorio de su doctor(a), en una clnica privada, en un centro de atencin urgente o en una sala de emergencias.  Si tiene Engineering geologist, por favor llame inmediatamente al 911 o vaya a la sala de emergencias.  Nmeros de bper  - Dr. Nehemiah Massed: [phone]  - Dra. Moye: [phone]  - Dra. Nicole Kindred: [phone]  En caso de inclemencias del [place], por favor llame a Johnsie Kindred principal al [phone] para una actualizacin sobre el [place] de cualquier retraso o cierre.  Consejos para la medicacin en dermatologa: Por favor, guarde las cajas en las que vienen los medicamentos de uso tpico para ayudarle a seguir las instrucciones sobre dnde y cmo usarlos. Las farmacias generalmente imprimen las instrucciones del medicamento slo en las cajas y no directamente en los tubos del [place].   Si su medicamento es muy caro, por favor, pngase en contacto con Zigmund Daniel llamando al [phone] y presione la opcin 4 o envenos un mensaje a travs de Pharmacist, community.   No podemos decirle cul ser su copago por los medicamentos por adelantado ya que esto es diferente dependiendo de la cobertura de su seguro. Sin embargo, es posible que podamos encontrar un medicamento sustituto a Electrical engineer un formulario para que el  seguro cubra el medicamento que se considera necesario.   Si se requiere una autorizacin previa para que su compaa de seguros [place] su medicamento, por favor permtanos de 1 a 2 das hbiles para completar este proceso.  Los precios de los medicamentos varan con frecuencia dependiendo del Environmental consultant de dnde se surte la receta y alguna farmacias pueden ofrecer precios ms baratos.  El sitio web www.goodrx.com tiene cupones para medicamentos de Airline pilot. Los precios aqu no tienen en cuenta lo que podra costar con la ayuda del seguro (puede ser ms barato con su seguro), pero el sitio web puede darle el precio si no utiliz Research scientist (physical sciences).  - Puede imprimir el cupn correspondiente y llevarlo con su receta a la farmacia.  - Tambin puede pasar por nuestra oficina durante el horario de atencin regular y Charity fundraiser una tarjeta de cupones de GoodRx.  - Si necesita que su receta se enve electrnicamente a Chiropodist, informe a nuestra oficina a travs de MyChart de [place] o por telfono llamando al [phone] y presione la opcin  4. ° °

## 2021-09-25 ENCOUNTER — Ambulatory Visit: Payer: Medicare Other

## 2021-09-26 ENCOUNTER — Ambulatory Visit (INDEPENDENT_AMBULATORY_CARE_PROVIDER_SITE_OTHER): Payer: Medicare Other | Admitting: Nurse Practitioner

## 2021-09-26 ENCOUNTER — Telehealth (INDEPENDENT_AMBULATORY_CARE_PROVIDER_SITE_OTHER): Payer: Self-pay

## 2021-09-26 ENCOUNTER — Other Ambulatory Visit: Payer: Self-pay

## 2021-09-26 ENCOUNTER — Encounter: Payer: Self-pay | Admitting: Nurse Practitioner

## 2021-09-26 ENCOUNTER — Encounter (INDEPENDENT_AMBULATORY_CARE_PROVIDER_SITE_OTHER): Payer: Self-pay

## 2021-09-26 VITALS — BP 102/71 | HR 94 | Resp 16 | Wt 213.0 lb

## 2021-09-26 DIAGNOSIS — L03116 Cellulitis of left lower limb: Secondary | ICD-10-CM | POA: Diagnosis not present

## 2021-09-26 NOTE — Progress Notes (Signed)
BP 124/80    Pulse 96    Temp 98.5 F (36.9 C) Comment: oral   Wt 211 lb (95.7 kg)    LMP  (LMP Unknown)    SpO2 98%    BMI 32.08 kg/m    Subjective:    Patient ID: Sharon [name], female    DOB: 05-18-1946, 75 y.o.   MRN: [id number]  HPI: Sharon [name] is a 75 y.o. female  Chief Complaint  Patient presents with   Cellulitis    Patient states she saw Dermatologist and she does not think it is Cellulitis and state she was told to go back on a previous lotion prescription to use. Patient states she had imaging and was told it is not a blood clot. Patient states when she wears the compression stocking it burns so bad. Patient states the keflex isn't working.    SKIN INFECTION Follow-up for cellulitis to left lower leg.  Had U/S and no DVT noted on 09/24/21.  Has underlying lymphedema at baseline, followed by vascular and dermatology.  Has had multiple episodes of cellulitis to lower legs.  Is currently on Penicillin at baseline daily, one in the morning and one in the evening -- she is holding this at this time while taking Keflex for acute flare.  Keflex started on 09/23/21.  Current symptoms started on 09/21/21.  Has taken Doxycycline in past with GI effects.    Has restarted her Lasix for edema.  At this time her leg is more red and continues to have pain.  Redness has extended upward slightly.  Duration: days Location: left lower leg History of trauma in area: no Pain: yes Quality: yes Severity: 7-8/10 Redness: yes Swelling: yes Oozing: no Pus: no Fevers: no Nausea/vomiting: no Status: stable Treatments attempted:antibiotics  Tetanus: UTD   Relevant past medical, surgical, family and social history reviewed and updated as indicated. Interim medical history since our last visit reviewed. Allergies and medications reviewed and updated.  Review of Systems  Constitutional:  Negative for activity change, appetite change, diaphoresis, fatigue and fever.  Respiratory:  Negative  for cough, chest tightness, shortness of breath and wheezing.   Cardiovascular:  Positive for leg swelling. Negative for chest pain and palpitations.  Gastrointestinal: Negative.   Neurological: Negative.   Psychiatric/Behavioral: Negative.     Per HPI unless specifically indicated above     Objective:    BP 124/80    Pulse 96    Temp 98.5 F (36.9 C) Comment: oral   Wt 211 lb (95.7 kg)    LMP  (LMP Unknown)    SpO2 98%    BMI 32.08 kg/m   Wt Readings from Last 3 Encounters:  09/26/21 211 lb (95.7 kg)  09/23/21 211 lb (95.7 kg)  09/10/21 211 lb 6.4 oz (95.9 kg)    Physical Exam Vitals and nursing note reviewed.  Constitutional:      General: She is awake. She is not in acute distress.    Appearance: She is well-developed and well-groomed. She is not ill-appearing or toxic-appearing.  HENT:     Head: Normocephalic.     Right Ear: Hearing normal.     Left Ear: Hearing normal.  Eyes:     General: Lids are normal.        Right eye: No discharge.        Left eye: No discharge.     Conjunctiva/sclera: Conjunctivae normal.  Cardiovascular:     Rate and Rhythm: Normal rate and  regular rhythm.     Heart sounds: No murmur heard.   No gallop.  Pulmonary:     Effort: Pulmonary effort is normal. No accessory muscle usage or respiratory distress.     Breath sounds: Normal breath sounds.  Musculoskeletal:     Cervical back: Normal range of motion.     Right lower leg: 1+ Edema present.     Left lower leg: 2+ Edema present.  Skin:    Comments: Left lower extremity with erythema extending from dorsal aspect foot to upper calf -- more bright red on exam today.  Edema present BLE with L>R.  Warmth on palpation to area.  Tenderness to palpation.  Neurological:     Mental Status: She is alert and oriented to person, place, and time.  Psychiatric:        Attention and Perception: Attention normal.        Mood and Affect: Mood normal.        Behavior: Behavior normal. Behavior is  cooperative.        Thought Content: Thought content normal.        Judgment: Judgment normal.   Results for orders placed or performed in visit on 09/10/21  Bayer DCA Hb A1c Waived  Result Value Ref Range   HB A1C (BAYER DCA - WAIVED) 6.2 (H) 4.8 - 5.6 %  Microalbumin, Urine Waived  Result Value Ref Range   Microalb, Ur Waived 10 0 - 19 mg/L   Creatinine, Urine Waived 200 10 - 300 mg/dL   Microalb/Creat Ratio <30 <30 mg/g  CBC with Differential/Platelet  Result Value Ref Range   WBC 8.0 3.4 - 10.8 x10E3/uL   RBC 4.68 3.77 - 5.28 x10E6/uL   Hemoglobin 13.8 11.1 - 15.9 g/dL   Hematocrit 41.2 34.0 - 46.6 %   MCV 88 79 - 97 fL   MCH 29.5 26.6 - 33.0 pg   MCHC 33.5 31.5 - 35.7 g/dL   RDW 12.3 11.7 - 15.4 %   Platelets 295 150 - 450 x10E3/uL   Neutrophils 68 Not Estab. %   Lymphs 22 Not Estab. %   Monocytes 9 Not Estab. %   Eos 1 Not Estab. %   Basos 0 Not Estab. %   Neutrophils Absolute 5.4 1.4 - 7.0 x10E3/uL   Lymphocytes Absolute 1.8 0.7 - 3.1 x10E3/uL   Monocytes Absolute 0.7 0.1 - 0.9 x10E3/uL   EOS (ABSOLUTE) 0.1 0.0 - 0.4 x10E3/uL   Basophils Absolute 0.0 0.0 - 0.2 x10E3/uL   Immature Granulocytes 0 Not Estab. %   Immature Grans (Abs) 0.0 0.0 - 0.1 x10E3/uL  Comprehensive metabolic panel  Result Value Ref Range   Glucose 104 (H) 70 - 99 mg/dL   BUN 13 8 - 27 mg/dL   Creatinine, Ser 0.97 0.57 - 1.00 mg/dL   eGFR 61 >59 mL/min/1.73   BUN/Creatinine Ratio 13 12 - 28   Sodium 145 (H) 134 - 144 mmol/L   Potassium 4.2 3.5 - 5.2 mmol/L   Chloride 104 96 - 106 mmol/L   CO2 24 20 - 29 mmol/L   Calcium 9.1 8.7 - 10.3 mg/dL   Total Protein 6.4 6.0 - 8.5 g/dL   Albumin 3.9 3.7 - 4.7 g/dL   Globulin, Total 2.5 1.5 - 4.5 g/dL   Albumin/Globulin Ratio 1.6 1.2 - 2.2   Bilirubin Total 0.3 0.0 - 1.2 mg/dL   Alkaline Phosphatase 101 44 - 121 IU/L   AST 19 0 - 40 IU/L  ALT 17 0 - 32 IU/L  TSH  Result Value Ref Range   TSH 0.965 0.450 - 4.500 uIU/mL  Lipid Panel w/o Chol/HDL  Ratio  Result Value Ref Range   Cholesterol, Total 191 100 - 199 mg/dL   Triglycerides 147 0 - 149 mg/dL   HDL 49 >39 mg/dL   VLDL Cholesterol Cal 26 5 - 40 mg/dL   LDL Chol Calc (NIH) 116 (H) 0 - 99 mg/dL  VITAMIN D 25 Hydroxy (Vit-D Deficiency, Fractures)  Result Value Ref Range   Vit D, 25-Hydroxy 49.4 30.0 - 100.0 ng/mL  Vitamin B12  Result Value Ref Range   Vitamin B-12 1,017 232 - 1,245 pg/mL  Magnesium  Result Value Ref Range   Magnesium 2.1 1.6 - 2.3 mg/dL      Assessment & Plan:   Problem List Items Addressed This Visit       Other   Cellulitis of left leg    Left lower leg with moderate erythema and warmth + increased swelling and tenderness.  No DVT present on imaging.  Has underlying lymphedema at baseline.  Recommend she continue to hold Penicillin at this time and continue Keflex QID until completed, which has offered benefit during acute infection in past.  Did not tolerate Doxycycline.  She is going to vascular at 2:30 pm for calamine wrap to be performed, updated her vascular provider via secure chat.  Appreciate their input.  She is to monitor closely and alert if worsening symptoms.  Return in 2 weeks, sooner if worsening.        Follow up plan: Return in about 2 weeks (around 10/10/2021) for Cellulitis.

## 2021-09-26 NOTE — Patient Instructions (Signed)
Cellulitis, Adult Cellulitis is a skin infection. The infected area is often warm, red, swollen, and sore. It occurs most often in the arms and lower legs. It is very important to get treated for this condition. What are the causes? This condition is caused by bacteria. The bacteria enter through a break in the skin, such as a cut, burn, insect bite, open sore, or crack. What increases the risk? This condition is more likely to occur in people who: Have a weak body defense system (immune system). Have open cuts, burns, bites, or scrapes on the skin. Are older than 75 years of age. Have a blood sugar problem (diabetes). Have a long-lasting (chronic) liver disease (cirrhosis) or kidney disease. Are very overweight (obese). Have a skin problem, such as: Itchy rash (eczema). Slow movement of blood in the veins (venous stasis). Fluid buildup below the skin (edema). Have been treated with high-energy rays (radiation). Use IV drugs. What are the signs or symptoms? Symptoms of this condition include: Skin that is: Red. Streaking. Spotting. Swollen. Sore or painful when you touch it. Warm. A fever. Chills. Blisters. How is this diagnosed? This condition is diagnosed based on: Medical history. Physical exam. Blood tests. Imaging tests. How is this treated? Treatment for this condition may include: Medicines to treat infections or allergies. Home care, such as: Rest. Placing cold or warm cloths (compresses) on the skin. Hospital care, if the condition is very bad. Follow these instructions at home: Medicines Take over-the-counter and prescription medicines only as told by your doctor. If you were prescribed an antibiotic medicine, take it as told by your doctor. Do not stop taking it even if you start to feel better. General instructions  Drink enough fluid to keep your pee (urine) pale yellow. Do not touch or rub the infected area. Raise (elevate) the infected area above the  level of your heart while you are sitting or lying down. Place cold or warm cloths on the area as told by your doctor. Keep all follow-up visits as told by your doctor. This is important. Contact a doctor if: You have a fever. You do not start to get better after 1-2 days of treatment. Your bone or joint under the infected area starts to hurt after the skin has healed. Your infection comes back. This can happen in the same area or another area. You have a swollen bump in the area. You have new symptoms. You feel ill and have muscle aches and pains. Get help right away if: Your symptoms get worse. You feel very sleepy. You throw up (vomit) or have watery poop (diarrhea) for a long time. You see red streaks coming from the area. Your red area gets larger. Your red area turns dark in color. These symptoms may represent a serious problem that is an emergency. Do not wait to see if the symptoms will go away. Get medical help right away. Call your local emergency services (911 in the [place] Do not drive yourself to the hospital. Summary Cellulitis is a skin infection. The area is often warm, red, swollen, and sore. This condition is treated with medicines, rest, and cold and warm cloths. Take all medicines only as told by your doctor. Tell your doctor if symptoms do not start to get better after 1-2 days of treatment. This information is not intended to replace advice given to you by your health care provider. Make sure you discuss any questions you have with your health care provider. Document Revised: 02/18/2018 Document Reviewed:  02/18/2018 Elsevier Patient Education  2022 [employer].

## 2021-09-26 NOTE — Assessment & Plan Note (Signed)
Left lower leg with moderate erythema and warmth + increased swelling and tenderness.  No DVT present on imaging.  Has underlying lymphedema at baseline.  Recommend she continue to hold Penicillin at this time and continue Keflex QID until completed, which has offered benefit during acute infection in past.  Did not tolerate Doxycycline.  She is going to vascular at 2:30 pm for calamine wrap to be performed, updated her vascular provider via secure chat.  Appreciate their input.  She is to monitor closely and alert if worsening symptoms.  Return in 2 weeks, sooner if worsening.

## 2021-09-26 NOTE — Progress Notes (Signed)
History of Present Illness  There is no documented history at this time  Assessments & Plan   There are no diagnoses linked to this encounter.    Additional instructions  Subjective:  Patient presents with venous ulcer of the Left lower extremity.    Procedure:  3 layer unna wrap was placed Left lower extremity.   Plan:   Follow up in one week.  

## 2021-09-26 NOTE — Telephone Encounter (Signed)
Patients PCP office called in requesting patient to come in and get left leg wrapped with lotion. Stated that it was really bad. Ask CMA April., she stated it was ok to add to schedule..   Patient is schedule for this afternoon

## 2021-09-30 ENCOUNTER — Ambulatory Visit (INDEPENDENT_AMBULATORY_CARE_PROVIDER_SITE_OTHER): Payer: Medicare Other

## 2021-09-30 ENCOUNTER — Encounter (INDEPENDENT_AMBULATORY_CARE_PROVIDER_SITE_OTHER): Payer: Self-pay | Admitting: Nurse Practitioner

## 2021-09-30 DIAGNOSIS — Z8673 Personal history of transient ischemic attack (TIA), and cerebral infarction without residual deficits: Secondary | ICD-10-CM | POA: Diagnosis not present

## 2021-10-01 LAB — CUP PACEART REMOTE DEVICE CHECK
Date Time Interrogation Session: 20221217235917
Implantable Pulse Generator Implant Date: 20190328

## 2021-10-02 ENCOUNTER — Encounter (INDEPENDENT_AMBULATORY_CARE_PROVIDER_SITE_OTHER): Payer: Self-pay

## 2021-10-03 ENCOUNTER — Ambulatory Visit (INDEPENDENT_AMBULATORY_CARE_PROVIDER_SITE_OTHER): Payer: Medicare Other | Admitting: Nurse Practitioner

## 2021-10-03 ENCOUNTER — Emergency Department: Payer: Medicare Other

## 2021-10-03 ENCOUNTER — Inpatient Hospital Stay
Admission: EM | Admit: 2021-10-03 | Discharge: 2021-10-09 | DRG: 603 | Disposition: A | Discharge status: Home / Self Care | Payer: Medicare Other | Attending: Internal Medicine | Admitting: Internal Medicine

## 2021-10-03 ENCOUNTER — Other Ambulatory Visit: Payer: Self-pay

## 2021-10-03 ENCOUNTER — Encounter: Payer: Self-pay | Admitting: Emergency Medicine

## 2021-10-03 VITALS — BP 110/75 | HR 105 | Ht 69.0 in | Wt 213.0 lb

## 2021-10-03 DIAGNOSIS — Z8673 Personal history of transient ischemic attack (TIA), and cerebral infarction without residual deficits: Secondary | ICD-10-CM | POA: Diagnosis not present

## 2021-10-03 DIAGNOSIS — Z8541 Personal history of malignant neoplasm of cervix uteri: Secondary | ICD-10-CM

## 2021-10-03 DIAGNOSIS — Z91040 Latex allergy status: Secondary | ICD-10-CM

## 2021-10-03 DIAGNOSIS — Z888 Allergy status to other drugs, medicaments and biological substances status: Secondary | ICD-10-CM | POA: Diagnosis not present

## 2021-10-03 DIAGNOSIS — R918 Other nonspecific abnormal finding of lung field: Secondary | ICD-10-CM | POA: Diagnosis not present

## 2021-10-03 DIAGNOSIS — E785 Hyperlipidemia, unspecified: Secondary | ICD-10-CM | POA: Diagnosis not present

## 2021-10-03 DIAGNOSIS — L03116 Cellulitis of left lower limb: Secondary | ICD-10-CM | POA: Diagnosis present

## 2021-10-03 DIAGNOSIS — J449 Chronic obstructive pulmonary disease, unspecified: Secondary | ICD-10-CM | POA: Diagnosis present

## 2021-10-03 DIAGNOSIS — Z20822 Contact with and (suspected) exposure to covid-19: Secondary | ICD-10-CM | POA: Diagnosis present

## 2021-10-03 DIAGNOSIS — L089 Local infection of the skin and subcutaneous tissue, unspecified: Secondary | ICD-10-CM | POA: Diagnosis not present

## 2021-10-03 DIAGNOSIS — Z6831 Body mass index (BMI) 31.0-31.9, adult: Secondary | ICD-10-CM | POA: Diagnosis not present

## 2021-10-03 DIAGNOSIS — Z9842 Cataract extraction status, left eye: Secondary | ICD-10-CM | POA: Diagnosis not present

## 2021-10-03 DIAGNOSIS — Z833 Family history of diabetes mellitus: Secondary | ICD-10-CM

## 2021-10-03 DIAGNOSIS — I10 Essential (primary) hypertension: Secondary | ICD-10-CM | POA: Diagnosis not present

## 2021-10-03 DIAGNOSIS — Z823 Family history of stroke: Secondary | ICD-10-CM | POA: Diagnosis not present

## 2021-10-03 DIAGNOSIS — Z7984 Long term (current) use of oral hypoglycemic drugs: Secondary | ICD-10-CM | POA: Diagnosis not present

## 2021-10-03 DIAGNOSIS — L0390 Cellulitis, unspecified: Secondary | ICD-10-CM | POA: Diagnosis not present

## 2021-10-03 DIAGNOSIS — L03115 Cellulitis of right lower limb: Secondary | ICD-10-CM

## 2021-10-03 DIAGNOSIS — Z7901 Long term (current) use of anticoagulants: Secondary | ICD-10-CM | POA: Diagnosis not present

## 2021-10-03 DIAGNOSIS — I2510 Atherosclerotic heart disease of native coronary artery without angina pectoris: Secondary | ICD-10-CM | POA: Diagnosis present

## 2021-10-03 DIAGNOSIS — E1169 Type 2 diabetes mellitus with other specified complication: Secondary | ICD-10-CM | POA: Diagnosis present

## 2021-10-03 DIAGNOSIS — L259 Unspecified contact dermatitis, unspecified cause: Secondary | ICD-10-CM | POA: Diagnosis present

## 2021-10-03 DIAGNOSIS — Z961 Presence of intraocular lens: Secondary | ICD-10-CM | POA: Diagnosis present

## 2021-10-03 DIAGNOSIS — Z79899 Other long term (current) drug therapy: Secondary | ICD-10-CM | POA: Diagnosis not present

## 2021-10-03 DIAGNOSIS — Z87891 Personal history of nicotine dependence: Secondary | ICD-10-CM | POA: Diagnosis not present

## 2021-10-03 DIAGNOSIS — Z8249 Family history of ischemic heart disease and other diseases of the circulatory system: Secondary | ICD-10-CM

## 2021-10-03 DIAGNOSIS — Z7951 Long term (current) use of inhaled steroids: Secondary | ICD-10-CM

## 2021-10-03 DIAGNOSIS — Z9841 Cataract extraction status, right eye: Secondary | ICD-10-CM

## 2021-10-03 DIAGNOSIS — R7303 Prediabetes: Secondary | ICD-10-CM | POA: Diagnosis present

## 2021-10-03 DIAGNOSIS — E669 Obesity, unspecified: Secondary | ICD-10-CM | POA: Diagnosis present

## 2021-10-03 LAB — CBC WITH DIFFERENTIAL/PLATELET
Abs Immature Granulocytes: 0.02 10*3/uL (ref 0.00–0.07)
Basophils Absolute: 0 10*3/uL (ref 0.0–0.1)
Basophils Relative: 0 %
Eosinophils Absolute: 0.2 10*3/uL (ref 0.0–0.5)
Eosinophils Relative: 4 %
HCT: 44.1 % (ref 36.0–46.0)
Hemoglobin: 14.6 g/dL (ref 12.0–15.0)
Immature Granulocytes: 0 %
Lymphocytes Relative: 22 %
Lymphs Abs: 1.5 10*3/uL (ref 0.7–4.0)
MCH: 29.6 pg (ref 26.0–34.0)
MCHC: 33.1 g/dL (ref 30.0–36.0)
MCV: 89.3 fL (ref 80.0–100.0)
Monocytes Absolute: 0.7 10*3/uL (ref 0.1–1.0)
Monocytes Relative: 9 %
Neutro Abs: 4.4 10*3/uL (ref 1.7–7.7)
Neutrophils Relative %: 65 %
Platelets: 227 10*3/uL (ref 150–400)
RBC: 4.94 MIL/uL (ref 3.87–5.11)
RDW: 13.2 % (ref 11.5–15.5)
WBC: 6.9 10*3/uL (ref 4.0–10.5)
nRBC: 0 % (ref 0.0–0.2)

## 2021-10-03 LAB — URINALYSIS, ROUTINE W REFLEX MICROSCOPIC
Bilirubin Urine: NEGATIVE
Glucose, UA: NEGATIVE mg/dL
Hgb urine dipstick: NEGATIVE
Ketones, ur: NEGATIVE mg/dL
Leukocytes,Ua: NEGATIVE
Nitrite: NEGATIVE
Protein, ur: NEGATIVE mg/dL
Specific Gravity, Urine: 1.01 (ref 1.005–1.030)
pH: 5.5 (ref 5.0–8.0)

## 2021-10-03 LAB — COMPREHENSIVE METABOLIC PANEL
ALT: 18 U/L (ref 0–44)
AST: 22 U/L (ref 15–41)
Albumin: 3.8 g/dL (ref 3.5–5.0)
Alkaline Phosphatase: 70 U/L (ref 38–126)
Anion gap: 7 (ref 5–15)
BUN: 16 mg/dL (ref 8–23)
CO2: 26 mmol/L (ref 22–32)
Calcium: 9 mg/dL (ref 8.9–10.3)
Chloride: 105 mmol/L (ref 98–111)
Creatinine, Ser: 0.93 mg/dL (ref 0.44–1.00)
GFR, Estimated: >60 mL/min (ref >60)
Glucose, Bld: 145 mg/dL — ABNORMAL HIGH (ref 70–99)
Potassium: 4.1 mmol/L (ref 3.5–5.1)
Sodium: 138 mmol/L (ref 135–145)
Total Bilirubin: 0.8 mg/dL (ref 0.3–1.2)
Total Protein: 6.8 g/dL (ref 6.5–8.1)

## 2021-10-03 LAB — RESP PANEL BY RT-PCR (FLU A&B, COVID) ARPGX2
Influenza A by PCR: NEGATIVE
Influenza B by PCR: NEGATIVE
SARS Coronavirus 2 by RT PCR: NEGATIVE

## 2021-10-03 LAB — LACTIC ACID, PLASMA: Lactic Acid, Venous: 1.2 mmol/L (ref 0.5–1.9)

## 2021-10-03 MED ORDER — FENTANYL CITRATE PF 50 MCG/ML IJ SOSY
50.0000 ug | PREFILLED_SYRINGE | Freq: Once | INTRAMUSCULAR | Status: DC
  Filled 2021-10-03: qty 1

## 2021-10-03 MED ORDER — ACETAMINOPHEN 500 MG PO TABS
1000.0000 mg | ORAL_TABLET | Freq: Once | ORAL | Status: AC
  Administered 2021-10-03: 21:00:00 1000 mg via ORAL
  Filled 2021-10-03: qty 2

## 2021-10-03 MED ORDER — FLUTICASONE FUROATE-VILANTEROL 200-25 MCG/ACT IN AEPB
1.0000 | INHALATION_SPRAY | Freq: Every day | RESPIRATORY_TRACT | Status: DC
  Filled 2021-10-03: qty 28

## 2021-10-03 MED ORDER — CLOPIDOGREL BISULFATE 75 MG PO TABS
75.0000 mg | ORAL_TABLET | Freq: Every day | ORAL | Status: DC
  Administered 2021-10-04 – 2021-10-09 (×6): 75 mg via ORAL
  Filled 2021-10-03 (×6): qty 1

## 2021-10-03 MED ORDER — CITALOPRAM HYDROBROMIDE 20 MG PO TABS
20.0000 mg | ORAL_TABLET | Freq: Every day | ORAL | Status: DC
  Administered 2021-10-05 – 2021-10-08 (×3): 20 mg via ORAL
  Filled 2021-10-03 (×6): qty 1

## 2021-10-03 MED ORDER — SODIUM CHLORIDE 0.9 % IV SOLN: 2.0000 g | Freq: Once | INTRAVENOUS | Status: DC

## 2021-10-03 MED ORDER — DOCUSATE SODIUM 100 MG PO CAPS
100.0000 mg | ORAL_CAPSULE | Freq: Two times a day (BID) | ORAL | Status: DC
  Administered 2021-10-04 – 2021-10-09 (×11): 100 mg via ORAL
  Filled 2021-10-03 (×11): qty 1

## 2021-10-03 MED ORDER — LACTATED RINGERS IV SOLN: INTRAVENOUS | Status: DC

## 2021-10-03 MED ORDER — BISACODYL 5 MG PO TBEC
5.0000 mg | DELAYED_RELEASE_TABLET | Freq: Every day | ORAL | Status: DC | PRN
  Administered 2021-10-06: 11:00:00 5 mg via ORAL
  Filled 2021-10-03: qty 1

## 2021-10-03 MED ORDER — MORPHINE SULFATE (PF) 2 MG/ML IV SOLN: 2.0000 mg | INTRAVENOUS | Status: DC | PRN

## 2021-10-03 MED ORDER — SODIUM CHLORIDE 0.9% FLUSH
3.0000 mL | Freq: Two times a day (BID) | INTRAVENOUS | Status: DC
  Administered 2021-10-03 – 2021-10-09 (×9): 3 mL via INTRAVENOUS

## 2021-10-03 MED ORDER — ACETAMINOPHEN 650 MG RE SUPP
650.0000 mg | Freq: Four times a day (QID) | RECTAL | Status: DC | PRN
  Filled 2021-10-03: qty 1

## 2021-10-03 MED ORDER — ACETAMINOPHEN 325 MG PO TABS
650.0000 mg | ORAL_TABLET | Freq: Four times a day (QID) | ORAL | Status: DC | PRN
  Administered 2021-10-04 – 2021-10-07 (×10): 650 mg via ORAL
  Filled 2021-10-03 (×10): qty 2

## 2021-10-03 MED ORDER — SODIUM CHLORIDE 0.9 % IV SOLN
2.0000 g | Freq: Three times a day (TID) | INTRAVENOUS | Status: DC
  Administered 2021-10-03 – 2021-10-04 (×3): 2 g via INTRAVENOUS
  Filled 2021-10-03 (×6): qty 2

## 2021-10-03 MED ORDER — VANCOMYCIN HCL IN DEXTROSE 1-5 GM/200ML-% IV SOLN
1000.0000 mg | Freq: Once | INTRAVENOUS | Status: AC
  Administered 2021-10-03: 21:00:00 1000 mg via INTRAVENOUS
  Filled 2021-10-03: qty 200

## 2021-10-03 MED ORDER — ONDANSETRON HCL 4 MG/2ML IJ SOLN: 4.0000 mg | Freq: Four times a day (QID) | INTRAMUSCULAR | Status: DC | PRN

## 2021-10-03 MED ORDER — VANCOMYCIN HCL IN DEXTROSE 1-5 GM/200ML-% IV SOLN
1000.0000 mg | Freq: Once | INTRAVENOUS | Status: AC
  Administered 2021-10-03: 1000 mg via INTRAVENOUS
  Filled 2021-10-03: qty 200

## 2021-10-03 MED ORDER — POLYETHYLENE GLYCOL 3350 17 G PO PACK
17.0000 g | PACK | Freq: Every day | ORAL | Status: DC | PRN
  Administered 2021-10-05: 05:00:00 17 g via ORAL
  Filled 2021-10-03: qty 1

## 2021-10-03 MED ORDER — HYDRALAZINE HCL 20 MG/ML IJ SOLN: 5.0000 mg | INTRAMUSCULAR | Status: DC | PRN

## 2021-10-03 MED ORDER — VANCOMYCIN HCL 1250 MG/250ML IV SOLN
1250.0000 mg | INTRAVENOUS | Status: DC
  Filled 2021-10-03: qty 250

## 2021-10-03 MED ORDER — OXYCODONE HCL 5 MG PO TABS
5.0000 mg | ORAL_TABLET | ORAL | Status: DC | PRN
  Administered 2021-10-05 – 2021-10-08 (×5): 5 mg via ORAL
  Filled 2021-10-03 (×6): qty 1

## 2021-10-03 MED ORDER — ONDANSETRON HCL 4 MG PO TABS
4.0000 mg | ORAL_TABLET | Freq: Four times a day (QID) | ORAL | Status: DC | PRN
  Administered 2021-10-05: 07:00:00 4 mg via ORAL
  Filled 2021-10-03: qty 1

## 2021-10-03 MED ORDER — ENOXAPARIN SODIUM 60 MG/0.6ML IJ SOSY
0.5000 mg/kg | PREFILLED_SYRINGE | INTRAMUSCULAR | Status: DC
  Administered 2021-10-04 – 2021-10-08 (×5): 47.5 mg via SUBCUTANEOUS
  Filled 2021-10-03 (×5): qty 0.6

## 2021-10-03 MED ORDER — ALBUTEROL SULFATE (2.5 MG/3ML) 0.083% IN NEBU
2.5000 mg | INHALATION_SOLUTION | Freq: Four times a day (QID) | RESPIRATORY_TRACT | Status: DC | PRN
  Administered 2021-10-04: 09:00:00 2.5 mg via RESPIRATORY_TRACT
  Filled 2021-10-03 (×2): qty 3

## 2021-10-03 NOTE — ED Triage Notes (Signed)
Pt via POV sent by her doctor for eval of cellulitis of LLE. Pt's left leg is very red and edematous from the kneecap down; pt first noticed symptoms about 2 weeks ago. Pt denies fevers/chills but does feel slightly nauseated. A/O x 4. Pt reports that she takes plavix and is receiving therapy for lymphedema.

## 2021-10-03 NOTE — ED Provider Notes (Signed)
[facility] Emergency Department Provider Note  ____________________________________________   I have reviewed the triage vital signs and the nursing notes.   HISTORY  Chief Complaint Cellulitis   History limited by: Not Limited   HPI Sharon [name] is a 75 y.o. female who presents to the emergency department today because of concerns for worsening left leg redness swelling and discomfort.  Patient states that her symptoms started about 10 days ago.  At that time she was put on oral Keflex.  She does have history of cellulitis and apparently is on prophylactic penicillin.  She then went and saw the vein and vascular specialist who wrapped her leg.  When they unwrapped it today they did notice that the redness and swelling has gotten worse.  Patient does complain of significant pain to that leg.  She denies any fevers. Was sent to the ED for IV abx.    Records reviewed. Per medical record review patient has a history of lymphadema.   Past Medical History:  Diagnosis Date   Anxiety    Arthritis    hands, upper back   Asthma    COPD (chronic obstructive pulmonary disease) ([place])    History of cervical cancer    Menopausal disorder    Osteoporosis    Pneumonia 1960   Spasm of abdominal muscles of right side    intermittent   Stroke ([place]) 2019   TMJ (dislocation of temporomandibular joint)    Wears dentures    partial lower    Patient Active Problem List   Diagnosis Date Noted   Cellulitis of left leg 09/23/2021   History of 2019 novel coronavirus disease (COVID-19) 04/10/2021   Personal history of colonic polyps    Rectal polyp    Peripheral vascular disease due to secondary diabetes ([place]) 01/11/2021   Osteopenia of neck of left femur 01/11/2021   Drug-induced myopathy 09/13/2020   Neuropathy of both feet 05/24/2020   Hyperlipidemia associated with type 2 diabetes mellitus ([place]) 02/14/2020   Obesity 02/14/2020   Vitamin D deficiency 06/28/2019    Type 2 diabetes mellitus with obesity ([place]) 06/28/2019   GERD (gastroesophageal reflux disease) 12/07/2018   Vitamin B12 deficiency 12/05/2018   Senile purpura ([place]) 07/28/2018   Lymphedema 07/14/2018   Aneurysm of descending thoracic aorta 05/04/2018   Allergic rhinitis 02/16/2018   Aortic atherosclerosis ([place]) 01/13/2018   Chronic kidney disease, stage 3 ([place]) 01/12/2018   History of CVA (cerebrovascular accident)    TIA (transient ischemic attack) 01/04/2018   CAD (coronary artery disease) 11/25/2017   Caregiver stress 11/25/2017   Menopause 08/14/2017   Stress incontinence 06/19/2017   Personal history of tobacco use, presenting hazards to health 04/01/2017   Anxiety 03/13/2017   Advanced care planning/counseling discussion 10/17/2016   Benign neoplasm of sigmoid colon    Centrilobular emphysema ([place]) 10/17/2015    Past Surgical History:  Procedure Laterality Date   ABDOMINAL HYSTERECTOMY  1970's   bladder botox  2005   BLADDER SUSPENSION  2004   CATARACT EXTRACTION W/PHACO Right 03/09/2018   Procedure: CATARACT EXTRACTION PHACO AND INTRAOCULAR LENS PLACEMENT ([place]) right;  Surgeon: Eulogio Bear, MD;  Location: [place];  Service: Ophthalmology;  Laterality: Right;  CALL CELL 1ST   CATARACT EXTRACTION W/PHACO Left 04/19/2018   Procedure: CATARACT EXTRACTION PHACO AND INTRAOCULAR LENS PLACEMENT ([facility])  LEFT;  Surgeon: Eulogio Bear, MD;  Location: [place];  Service: Ophthalmology;  Laterality: Left;   COLONOSCOPY WITH PROPOFOL N/A  12/13/2015   Procedure: COLONOSCOPY WITH PROPOFOL;  Surgeon: Lucilla Lame, MD;  Location: [place];  Service: Endoscopy;  Laterality: N/A;   COLONOSCOPY WITH PROPOFOL N/A 03/14/2021   Procedure: COLONOSCOPY WITH PROPOFOL;  Surgeon: Lucilla Lame, MD;  Location: [place] [facility];  Service: Endoscopy;  Laterality: N/A;  diabetic   LOOP RECORDER INSERTION N/A 01/07/2018   Procedure: LOOP RECORDER INSERTION;  Surgeon:  Deboraha Sprang, MD;  Location: [facility];  Service: Cardiovascular;  Laterality: N/A;   POLYPECTOMY N/A 12/13/2015   Procedure: POLYPECTOMY;  Surgeon: Lucilla Lame, MD;  Location: [facility];  Service: Endoscopy;  Laterality: N/A;  SIGMOID COLON POLYPS X  5   POLYPECTOMY N/A 03/14/2021   Procedure: POLYPECTOMY;  Surgeon: Lucilla Lame, MD;  Location: [place];  Service: Endoscopy;  Laterality: N/A;   SHOULDER ARTHROSCOPY W/ ROTATOR CUFF REPAIR Right 1998   TEE WITHOUT CARDIOVERSION N/A 01/06/2018   Procedure: TRANSESOPHAGEAL ECHOCARDIOGRAM (TEE);  Surgeon: Minna Merritts, MD;  Location: [facility];  Service: Cardiovascular;  Laterality: N/A;   TONSILLECTOMY AND ADENOIDECTOMY      Prior to Admission medications   Medication Sig Start Date End Date Taking? Authorizing Provider  acetaminophen (TYLENOL) 500 MG tablet Take 500 mg by mouth every 6 (six) hours as needed.    [provider]  Alirocumab (PRALUENT) 150 MG/ML SOAJ Inject 1 mL into the skin every 14 (fourteen) days. 03/22/21   Minna Merritts, MD  Blood Glucose Monitoring Suppl (ONETOUCH VERIO) w/Device KIT Utilize to check blood sugar twice a day, fasting in morning with goal < 130 and then 2 hours after a meal with goal <180.  Document and bring to visits. 07/31/20   [name], Henrine Screws T, NP  Cholecalciferol 1.25 MG (50000 UT) TABS Take 1 tablet by mouth once a week. 04/29/21   [name], Henrine Screws T, NP  citalopram (CELEXA) 20 MG tablet Take 1 tablet (20 mg total) by mouth as needed. 09/10/21   [name], Henrine Screws T, NP  clopidogrel (PLAVIX) 75 MG tablet TAKE 1 TABLET(75 MG) BY MOUTH DAILY 12/19/20   [name], Jolene T, NP  Cyanocobalamin 1000 MCG/ML KIT Inject 1,000 mcg as directed every 30 (thirty) days.    [provider]  furosemide (LASIX) 20 MG tablet Take 0.5 tablets (10 mg total) by mouth daily. 09/23/21   [name], Henrine Screws T, NP  glucose blood (ONETOUCH VERIO) test strip Utilize to check blood sugar  twice a day, fasting in morning with goal < 130 and then 2 hours after a meal with goal <180.  Document and bring to visits. 07/31/20   [name], Henrine Screws T, NP  Lancets (ONETOUCH ULTRASOFT) lancets Utilize to check blood sugar twice a day, fasting in morning with goal < 130 and then 2 hours after a meal with goal <180.  Document and bring to visits. 07/31/20   [name], Henrine Screws T, NP  loratadine (CLARITIN) 10 MG tablet Take 10 mg by mouth daily.    [provider]  metFORMIN (GLUCOPHAGE) 500 MG tablet TAKE 1 TABLET(500 MG) BY MOUTH DAILY WITH BREAKFAST 07/14/21   [name], Jolene T, NP  mometasone (ELOCON) 0.1 % cream APPLY TO LOWER LEGS TWICE DAILY UNTIL RASH IMPROVED. Leonette Most 05/06/21   Brendolyn Patty, MD  pantoprazole (PROTONIX) 40 MG tablet TAKE 1 TABLET(40 MG) BY MOUTH DAILY 07/25/21   [name] T, NP  penicillin v potassium (VEETID) 250 MG tablet Take by mouth. 08/22/21   [provider]  pimecrolimus (  ELIDEL) 1 % cream Apply topically as directed. Qd to bid aa rash on lower legs 07/15/21   Brendolyn Patty, MD  PROAIR HFA 108 [phone] Base) MCG/ACT inhaler INHALE 2 PUFFS INTO THE LUNGS EVERY 6 HOURS AS NEEDED FOR WHEEZING OR SHORTNESS OF BREATH 05/14/21   [name], [name], NP  SYMBICORT 160-4.5 MCG/ACT inhaler Inhale into the lungs. 09/23/21   [provider]  tacrolimus (PROTOPIC) 0.1 % ointment Apply to lower legs 1-2 times a day until rash improved. 12/17/20   Brendolyn Patty, MD  Tiotropium Bromide-Olodaterol 2.5-2.5 MCG/ACT AERS Inhale 2 puffs into the lungs daily. 09/10/21   [name], Henrine Screws T, NP    Allergies Statins, Quinolones, Baclofen, Bactrim [sulfamethoxazole-trimethoprim], Ibuprofen, Latex, Librium [chlordiazepoxide], Naprosyn [naproxen], and Other  Family History  Problem Relation Age of Onset   Diabetes Mother    Heart disease Mother    Stroke Mother    Stroke Maternal Grandmother     Social History Social History   Tobacco Use    Smoking status: Former    Years: 56.00    Types: Cigarettes    Quit date: 07/14/2016    Years since quitting: 5.2   Smokeless tobacco: Never  Vaping Use   Vaping Use: Former  Substance Use Topics   Alcohol use: Yes    Alcohol/week: 0.0 standard drinks    Comment: occassional   Drug use: No    Review of Systems Constitutional: No fever/chills Eyes: No visual changes. ENT: No sore throat. Cardiovascular: Denies chest pain. Respiratory: Denies shortness of breath. Gastrointestinal: No abdominal pain.  No nausea, no vomiting.  No diarrhea.   Genitourinary: Negative for dysuria. Musculoskeletal: Positive for left leg swelling, pain, erythema. Skin: Negative for rash. Neurological: Negative for headaches, focal weakness or numbness.  ____________________________________________   PHYSICAL EXAM:  VITAL SIGNS: ED Triage Vitals  Enc Vitals Group     BP 10/03/21 1420 (!) 127/105     Pulse Rate 10/03/21 1420 (!) 108     Resp 10/03/21 1420 16     Temp 10/03/21 1420 98.5 F (36.9 C)     Temp Source 10/03/21 1420 Oral     SpO2 10/03/21 1420 99 %     Weight 10/03/21 1421 213 lb (96.6 kg)     Height 10/03/21 1421 _0  (1.753 m)     Head Circumference --      Peak Flow --      Pain Score 10/03/21 1420 8   Constitutional: Alert and oriented.  Eyes: Conjunctivae are normal.  ENT      Head: Normocephalic and atraumatic.      Nose: No congestion/rhinnorhea.      Mouth/Throat: Mucous membranes are moist.      Neck: No stridor. Hematological/Lymphatic/Immunilogical: No cervical lymphadenopathy. Cardiovascular: Normal rate, regular rhythm.  No murmurs, rubs, or gallops.  Respiratory: Normal respiratory effort without tachypnea nor retractions. Breath sounds are clear and equal bilaterally. No wheezes/rales/rhonchi. Gastrointestinal: Soft and non tender. No rebound. No guarding.  Genitourinary: Deferred Musculoskeletal: Left leg swelling, erythema, warmth.  Neurologic:  Normal  speech and language. No gross focal neurologic deficits are appreciated.  Skin:  Skin is warm, dry and intact. No rash noted. Psychiatric: Mood and affect are normal. Speech and behavior are normal. Patient exhibits appropriate insight and judgment.  ____________________________________________    LABS (pertinent positives/negatives)  UA unremarkable CMP wnl except glu 145 Lactic acid 1.2 CBC wbc 6.9, hgb 14.6, plt 227 ____________________________________________   EKG  None  ____________________________________________  RADIOLOGY  CXR No acute abnormality  ____________________________________________   PROCEDURES  Procedures  ____________________________________________   INITIAL IMPRESSION / ASSESSMENT AND PLAN / ED COURSE  Pertinent labs & imaging results that were available during my care of the patient were reviewed by me and considered in my medical decision making (see chart for details).   Patient presented to the emergency department today at the advice of her made a vascular provider because of concern for worsening leg cellulitis. She had taken outpatient kelfex without any improvement. Exam is consistent with cellulitis. At this point do think she would benefit from IV abx. Discussed with patient. Will plan on admission.   ____________________________________________   FINAL CLINICAL IMPRESSION(S) / ED DIAGNOSES  Final diagnoses:  Cellulitis of left lower extremity     Note: This dictation was prepared with Dragon dictation. Any transcriptional errors that result from this process are unintentional     Nance Pear, MD 10/03/21 2036

## 2021-10-03 NOTE — Progress Notes (Signed)
Pharmacy Antibiotic Note  Sharon [name] is a 75 y.o. female admitted on 10/03/2021. Pharmacy has been consulted for vancomycin and cefepime dosing for cellulitis.  Plan: Vancomycin 1 g given in the ED. Add 1 g for 2 g total loading dose. Follow with vancomycin 1250 mg IV q24h.  Goal AUC 400-550 Expected AUC: 520 SCr used: 0.93  Cefepime 2 g IV q8h   Height: 5\' 9"  (175.3 cm) Weight: 96.6 kg (213 lb) IBW/kg (Calculated) : 66.2  Temp (24hrs), Avg:98.3 F (36.8 C), Min:98 F (36.7 C), Max:98.5 F (36.9 C)  Recent Labs  Lab 10/03/21 1422  WBC 6.9  CREATININE 0.93  LATICACIDVEN 1.2    Estimated Creatinine Clearance: 64.7 mL/min (by C-G formula based on SCr of 0.93 mg/dL).    Allergies  Allergen Reactions   Statins Other (See Comments)    myalgia   Quinolones     FLUOROQUINOLONES - Pt reports she was told to NEVER take these.   Baclofen Swelling   Bactrim [Sulfamethoxazole-Trimethoprim] Nausea And Vomiting   Ibuprofen Rash    Mouth swelling   Latex Rash    Some bandaids, some gloves; BLOOD TEST NEGATIVE   Librium [Chlordiazepoxide] Itching    Dizziness    Naprosyn [Naproxen] Rash    Mouth swelling   Other Rash    [place] nuts - mouth swelling    Antimicrobials this admission: Vancomycin 12/22 >> Cefepime 12/22 >>  Microbiology results: 12/22 BCx: pending   Thank you for allowing pharmacy to be a part of this patients care.  Tawnya Crook, PharmD, BCPS Clinical Pharmacist 10/03/2021 9:11 PM

## 2021-10-03 NOTE — Progress Notes (Signed)
Anticoagulation monitoring(Lovenox):  75 yo female ordered Lovenox 40 mg Q24h    Filed Weights   10/03/21 1421  Weight: 96.6 kg (213 lb)   BMI 31.45   Lab Results  Component Value Date   CREATININE 0.93 10/03/2021   CREATININE 0.97 09/10/2021   CREATININE 0.94 06/06/2021   Estimated Creatinine Clearance: 64.7 mL/min (by C-G formula based on SCr of 0.93 mg/dL). Hemoglobin & Hematocrit     Component Value Date/Time   HGB 14.6 10/03/2021 1422   HGB 13.8 09/10/2021 1512   HCT 44.1 10/03/2021 1422   HCT 41.2 09/10/2021 1512     Per Protocol for Patient with estCrcl > 30 ml/min and BMI > 30, will transition to Lovenox 47.5 mg Q24h.

## 2021-10-03 NOTE — Progress Notes (Signed)
Pt went to ER due to cellulitis spread further up leg.

## 2021-10-03 NOTE — ED Notes (Signed)
First nurse note   sent in from PCP for evaluation of cellulitis to lower leg

## 2021-10-03 NOTE — H&P (Signed)
History and Physical    Sharon [name] WFU:[id number] DOB: 01-07-1946 DOA: 10/03/2021  PCP: [name], NP    Patient coming from:  Home    Chief Complaint:  Cellulitis    HPI:  Sharon [name] is a 75 y.o. female seen in ed with complaints of left leg redness and swelling. Pt reports it has been going on since two weeks.  Pt went to pcp and was given keflex , she goes to wound care and is complaints with her lymphedema wrap and compression stocking. She is diabetic and also has cats in her home but does not reports any scratches.  Ros is negative for any chest pain or vision or headache or fever chills or chest pain palpitations.  Pt has past medical history of DM II, HTN. ED Course:  Vitals:   10/03/21 1420 10/03/21 1421 10/03/21 1815 10/03/21 1956  BP: (!) 127/105  (!) 159/86 140/87  Pulse: (!) 108  91 (!) 103  Resp: 16  17 17   Temp: 98.5 F (36.9 C)  98 F (36.7 C)   TempSrc: Oral  Oral   SpO2: 99%  94% 97%  Weight:  96.6 kg    Height:  5' 9"  (1.753 m)      In ed pt is alert and oriented and denies nay complaints except for tenderness and redness in left leg. Labs shows: CMP shows glucose of 145 o/w normal.  CBC is normal with wbc of 6.9 and hb of 14.6. Pt in ed received vancomycin and admission started for iv abx.   Review of Systems:  Review of Systems  Constitutional:  Negative for chills and fever.  HENT: Negative.    Eyes: Negative.   Respiratory: Negative.    Cardiovascular: Negative.   Gastrointestinal: Negative.   Musculoskeletal: Negative.   Skin:  Positive for rash.  Neurological: Negative.   Endo/Heme/Allergies: Negative.   All other systems reviewed and are negative.   Past Medical History:  Diagnosis Date   Anxiety    Arthritis    hands, upper back   Asthma    COPD (chronic obstructive pulmonary disease) ([place])    History of cervical cancer    Menopausal disorder    Osteoporosis    Pneumonia 1960   Spasm of abdominal muscles of  right side    intermittent   Stroke ([place]) 2019   TMJ (dislocation of temporomandibular joint)    Wears dentures    partial lower    Past Surgical History:  Procedure Laterality Date   ABDOMINAL HYSTERECTOMY  1970's   bladder botox  2005   BLADDER SUSPENSION  2004   CATARACT EXTRACTION W/PHACO Right 03/09/2018   Procedure: CATARACT EXTRACTION PHACO AND INTRAOCULAR LENS PLACEMENT ([place]) right;  Surgeon: Eulogio Bear, MD;  Location: [facility];  Service: Ophthalmology;  Laterality: Right;  CALL CELL 1ST   CATARACT EXTRACTION W/PHACO Left 04/19/2018   Procedure: CATARACT EXTRACTION PHACO AND INTRAOCULAR LENS PLACEMENT ([facility])  LEFT;  Surgeon: Eulogio Bear, MD;  Location: [facility];  Service: Ophthalmology;  Laterality: Left;   COLONOSCOPY WITH PROPOFOL N/A 12/13/2015   Procedure: COLONOSCOPY WITH PROPOFOL;  Surgeon: Lucilla Lame, MD;  Location: [facility];  Service: Endoscopy;  Laterality: N/A;   COLONOSCOPY WITH PROPOFOL N/A 03/14/2021   Procedure: COLONOSCOPY WITH PROPOFOL;  Surgeon: Lucilla Lame, MD;  Location: [facility];  Service: Endoscopy;  Laterality: N/A;  diabetic   LOOP RECORDER INSERTION N/A 01/07/2018   Procedure: LOOP  RECORDER INSERTION;  Surgeon: Deboraha Sprang, MD;  Location: [facility];  Service: Cardiovascular;  Laterality: N/A;   POLYPECTOMY N/A 12/13/2015   Procedure: POLYPECTOMY;  Surgeon: Lucilla Lame, MD;  Location: [facility];  Service: Endoscopy;  Laterality: N/A;  SIGMOID COLON POLYPS X  5   POLYPECTOMY N/A 03/14/2021   Procedure: POLYPECTOMY;  Surgeon: Lucilla Lame, MD;  Location: [facility];  Service: Endoscopy;  Laterality: N/A;   SHOULDER ARTHROSCOPY W/ ROTATOR CUFF REPAIR Right 1998   TEE WITHOUT CARDIOVERSION N/A 01/06/2018   Procedure: TRANSESOPHAGEAL ECHOCARDIOGRAM (TEE);  Surgeon: Minna Merritts, MD;  Location: [facility];  Service: Cardiovascular;  Laterality: N/A;   TONSILLECTOMY AND  ADENOIDECTOMY       reports that she quit smoking about 5 years ago. Her smoking use included cigarettes. She has never used smokeless tobacco. She reports current alcohol use. She reports that she does not use drugs.  Allergies  Allergen Reactions   Statins Other (See Comments)    myalgia   Quinolones     FLUOROQUINOLONES - Pt reports she was told to NEVER take these.   Baclofen Swelling   Bactrim [Sulfamethoxazole-Trimethoprim] Nausea And Vomiting   Ibuprofen Rash    Mouth swelling   Latex Rash    Some bandaids, some gloves; BLOOD TEST NEGATIVE   Librium [Chlordiazepoxide] Itching    Dizziness    Naprosyn [Naproxen] Rash    Mouth swelling   Other Rash    [place] nuts - mouth swelling    Family History  Problem Relation Age of Onset   Diabetes Mother    Heart disease Mother    Stroke Mother    Stroke Maternal Grandmother     Prior to Admission medications   Medication Sig Start Date End Date Taking? Authorizing Provider  acetaminophen (TYLENOL) 500 MG tablet Take 500 mg by mouth every 6 (six) hours as needed.    [provider]  Alirocumab (PRALUENT) 150 MG/ML SOAJ Inject 1 mL into the skin every 14 (fourteen) days. 03/22/21   Minna Merritts, MD  Blood Glucose Monitoring Suppl (ONETOUCH VERIO) w/Device KIT Utilize to check blood sugar twice a day, fasting in morning with goal < 130 and then 2 hours after a meal with goal <180.  Document and bring to visits. 07/31/20   [name], Henrine Screws T, NP  Cholecalciferol 1.25 MG (50000 UT) TABS Take 1 tablet by mouth once a week. 04/29/21   [name], Henrine Screws T, NP  citalopram (CELEXA) 20 MG tablet Take 1 tablet (20 mg total) by mouth as needed. 09/10/21   [name], Henrine Screws T, NP  clopidogrel (PLAVIX) 75 MG tablet TAKE 1 TABLET(75 MG) BY MOUTH DAILY 12/19/20   [name], Jolene T, NP  Cyanocobalamin 1000 MCG/ML KIT Inject 1,000 mcg as directed every 30 (thirty) days.    [provider]  furosemide (LASIX) 20 MG tablet Take 0.5  tablets (10 mg total) by mouth daily. 09/23/21   [name], Henrine Screws T, NP  glucose blood (ONETOUCH VERIO) test strip Utilize to check blood sugar twice a day, fasting in morning with goal < 130 and then 2 hours after a meal with goal <180.  Document and bring to visits. 07/31/20   [name], Henrine Screws T, NP  Lancets (ONETOUCH ULTRASOFT) lancets Utilize to check blood sugar twice a day, fasting in morning with goal < 130 and then 2 hours after a meal with goal <180.  Document and bring to visits. 07/31/20   [name], NP  loratadine (CLARITIN) 10 MG tablet Take 10 mg by mouth daily.    [provider]  metFORMIN (GLUCOPHAGE) 500 MG tablet TAKE 1 TABLET(500 MG) BY MOUTH DAILY WITH BREAKFAST 07/14/21   [name], Jolene T, NP  mometasone (ELOCON) 0.1 % cream APPLY TO LOWER LEGS TWICE DAILY UNTIL RASH IMPROVED. Leonette Most 05/06/21   Brendolyn Patty, MD  pantoprazole (PROTONIX) 40 MG tablet TAKE 1 TABLET(40 MG) BY MOUTH DAILY 07/25/21   [name] T, NP  penicillin v potassium (VEETID) 250 MG tablet Take by mouth. 08/22/21   [provider]  pimecrolimus (ELIDEL) 1 % cream Apply topically as directed. Qd to bid aa rash on lower legs 07/15/21   Brendolyn Patty, MD  PROAIR HFA 108 [phone] Base) MCG/ACT inhaler INHALE 2 PUFFS INTO THE LUNGS EVERY 6 HOURS AS NEEDED FOR WHEEZING OR SHORTNESS OF BREATH 05/14/21   [name], [name], NP  SYMBICORT 160-4.5 MCG/ACT inhaler Inhale into the lungs. 09/23/21   [provider]  tacrolimus (PROTOPIC) 0.1 % ointment Apply to lower legs 1-2 times a day until rash improved. 12/17/20   Brendolyn Patty, MD  Tiotropium Bromide-Olodaterol 2.5-2.5 MCG/ACT AERS Inhale 2 puffs into the lungs daily. 09/10/21   [name], NP    Physical Exam: Vitals:   10/03/21 1420 10/03/21 1421 10/03/21 1815 10/03/21 1956  BP: (!) 127/105  (!) 159/86 140/87  Pulse: (!) 108  91 (!) 103  Resp: 16  17 17   Temp: 98.5 F (36.9 C)  98 F (36.7 C)   TempSrc:  Oral  Oral   SpO2: 99%  94% 97%  Weight:  96.6 kg    Height:  5' 9"  (1.753 m)     Physical Exam Vitals reviewed.  Constitutional:      General: She is not in acute distress.    Appearance: She is not ill-appearing.  HENT:     Head: Normocephalic and atraumatic.     Right Ear: External ear normal.     Left Ear: External ear normal.     Nose: Nose normal.     Mouth/Throat:     Mouth: Mucous membranes are moist.  Eyes:     Extraocular Movements: Extraocular movements intact.     Pupils: Pupils are equal, round, and reactive to light.  Cardiovascular:     Rate and Rhythm: Normal rate and regular rhythm.     Pulses: Normal pulses.     Heart sounds: Normal heart sounds.  Pulmonary:     Effort: Pulmonary effort is normal.     Breath sounds: Normal breath sounds.  Abdominal:     General: Bowel sounds are normal. There is no distension.     Palpations: Abdomen is soft. There is no mass.     Tenderness: There is no abdominal tenderness. There is no guarding.  Musculoskeletal:        General: Swelling and tenderness present.     Right lower leg: Edema present.     Left lower leg: Edema present.  Skin:    Findings: Erythema present.  Neurological:     General: No focal deficit present.     Mental Status: She is alert and oriented to person, place, and time.  Psychiatric:        Mood and Affect: Mood normal.        Behavior: Behavior normal.    Labs on Admission: I have personally reviewed following labs and imaging studies  No results for input(s): CKTOTAL, CKMB, TROPONINI  in the last 72 hours. Lab Results  Component Value Date   WBC 6.9 10/03/2021   HGB 14.6 10/03/2021   HCT 44.1 10/03/2021   MCV 89.3 10/03/2021   PLT 227 10/03/2021    Recent Labs  Lab 10/03/21 1422  NA 138  K 4.1  CL 105  CO2 26  BUN 16  CREATININE 0.93  CALCIUM 9.0  PROT 6.8  BILITOT 0.8  ALKPHOS 70  ALT 18  AST 22  GLUCOSE 145*   Lab Results  Component Value Date   CHOL 191  09/10/2021   HDL 49 09/10/2021   LDLCALC 116 (H) 09/10/2021   TRIG 147 09/10/2021   No results found for: DDIMER Invalid input(s): POCBNP   COVID-19 Labs No results for input(s): DDIMER, FERRITIN, LDH, CRP in the last 72 hours. Lab Results  Component Value Date   [place] NEGATIVE 06/06/2021    Radiological Exams on Admission: DG Chest 2 View  Result Date: 10/03/2021 CLINICAL DATA:  Left leg infection EXAM: CHEST - 2 VIEW COMPARISON:  06/06/2021 FINDINGS: Hyperinflation. Heart and mediastinal contours are within normal limits. No focal opacities or effusions. No acute bony abnormality. Left chest wall loop recorder device noted. IMPRESSION: Hyperinflation.  No active cardiopulmonary disease. Electronically Signed   By: Rolm Baptise M.D.   On: 10/03/2021 14:57    EKG: Independently reviewed.  None   Assessment/Plan: Principal Problem:   Cellulitis Active Problems:   CAD (coronary artery disease)   History of CVA (cerebrovascular accident)   Type 2 diabetes mellitus with obesity ([place])   Hyperlipidemia associated with type 2 diabetes mellitus ([place])   Cellulitis of left leg   Cellulitis of left leg: We will admit to med surg with plan for iv abx and ivf. Suspect MRSA.  We will get pharmacy consult for vancomycin and cefepime.  Skin intact and no need for dressing changes .  CAD: Cont home regimen of statin and plavix.   H/O CVA: Cont  plavix. Pt is neurologically normal at baseline nonfocal and non dysarthria.   DM II; Home metformin held we will cont with SSI. Last a1c is 6.2.  Hyperlipidemia: Pt on  praluent regimen.    DVT prophylaxis:  Heparin    Code Status:  Full code    Family Communication:  Sieg,Gary F (Spouse)  [phone] (Home Phone)   Disposition Plan:  Home    Consults called:  None   Admission status: Inpatient.     Para Skeans MD [facility] [phone] How to contact the [facility] Attending or Consulting provider [place] or covering provider during after hours [place], for this patient.    Check the care team in [facility] and look for a) attending/consulting [facility] provider listed and b) the [facility] team listed Log into [email or web address] and use [place]'s universal password to access. If you do not have the password, please contact the hospital operator. Locate the [facility] provider you are looking for under [facility] and page to a number that you can be directly reached. If you still have difficulty reaching the provider, please page the [facility] (Director on Call) for the Hospitalists listed on amion for assistance. [email or web address] Password [facility] 10/03/2021, 9:55 PM

## 2021-10-04 ENCOUNTER — Encounter: Payer: Self-pay | Admitting: Nurse Practitioner

## 2021-10-04 ENCOUNTER — Encounter (INDEPENDENT_AMBULATORY_CARE_PROVIDER_SITE_OTHER): Payer: Self-pay | Admitting: Nurse Practitioner

## 2021-10-04 DIAGNOSIS — Z8673 Personal history of transient ischemic attack (TIA), and cerebral infarction without residual deficits: Secondary | ICD-10-CM

## 2021-10-04 DIAGNOSIS — L03116 Cellulitis of left lower limb: Secondary | ICD-10-CM | POA: Diagnosis not present

## 2021-10-04 DIAGNOSIS — I2510 Atherosclerotic heart disease of native coronary artery without angina pectoris: Secondary | ICD-10-CM | POA: Diagnosis not present

## 2021-10-04 DIAGNOSIS — E1169 Type 2 diabetes mellitus with other specified complication: Secondary | ICD-10-CM | POA: Diagnosis not present

## 2021-10-04 LAB — CBC
HCT: 41 % (ref 36.0–46.0)
Hemoglobin: 13.5 g/dL (ref 12.0–15.0)
MCH: 29.2 pg (ref 26.0–34.0)
MCHC: 32.9 g/dL (ref 30.0–36.0)
MCV: 88.7 fL (ref 80.0–100.0)
Platelets: 210 10*3/uL (ref 150–400)
RBC: 4.62 MIL/uL (ref 3.87–5.11)
RDW: 13.3 % (ref 11.5–15.5)
WBC: 6.9 10*3/uL (ref 4.0–10.5)
nRBC: 0 % (ref 0.0–0.2)

## 2021-10-04 LAB — COMPREHENSIVE METABOLIC PANEL
ALT: 17 U/L (ref 0–44)
AST: 22 U/L (ref 15–41)
Albumin: 3.6 g/dL (ref 3.5–5.0)
Alkaline Phosphatase: 62 U/L (ref 38–126)
Anion gap: 6 (ref 5–15)
BUN: 16 mg/dL (ref 8–23)
CO2: 27 mmol/L (ref 22–32)
Calcium: 9 mg/dL (ref 8.9–10.3)
Chloride: 104 mmol/L (ref 98–111)
Creatinine, Ser: 0.91 mg/dL (ref 0.44–1.00)
GFR, Estimated: >60 mL/min (ref >60)
Glucose, Bld: 123 mg/dL — ABNORMAL HIGH (ref 70–99)
Potassium: 4.5 mmol/L (ref 3.5–5.1)
Sodium: 137 mmol/L (ref 135–145)
Total Bilirubin: 0.7 mg/dL (ref 0.3–1.2)
Total Protein: 6.5 g/dL (ref 6.5–8.1)

## 2021-10-04 LAB — LACTIC ACID, PLASMA: Lactic Acid, Venous: 2.5 mmol/L (ref 0.5–1.9)

## 2021-10-04 LAB — GLUCOSE, CAPILLARY
Glucose-Capillary: 109 mg/dL — ABNORMAL HIGH (ref 70–99)
Glucose-Capillary: 188 mg/dL — ABNORMAL HIGH (ref 70–99)

## 2021-10-04 MED ORDER — GUAIFENESIN-DM 100-10 MG/5ML PO SYRP
5.0000 mL | ORAL_SOLUTION | ORAL | Status: DC | PRN
  Administered 2021-10-05: 07:00:00 5 mL via ORAL
  Filled 2021-10-04: qty 5

## 2021-10-04 MED ORDER — INSULIN ASPART 100 UNIT/ML IJ SOLN
0.0000 [IU] | INTRAMUSCULAR | Status: DC
  Administered 2021-10-04: 22:00:00 3 [IU] via SUBCUTANEOUS
  Administered 2021-10-05 (×2): 2 [IU] via SUBCUTANEOUS
  Administered 2021-10-05 – 2021-10-08 (×3): 3 [IU] via SUBCUTANEOUS
  Administered 2021-10-08: 05:00:00 2 [IU] via SUBCUTANEOUS
  Administered 2021-10-08: 3 [IU] via SUBCUTANEOUS
  Administered 2021-10-08 (×2): 2 [IU] via SUBCUTANEOUS
  Filled 2021-10-04 (×11): qty 1

## 2021-10-04 MED ORDER — NON FORMULARY: 2.0000 | Freq: Every day | Status: DC

## 2021-10-04 MED ORDER — DIPHENHYDRAMINE HCL 25 MG PO CAPS
25.0000 mg | ORAL_CAPSULE | Freq: Once | ORAL | Status: AC
  Administered 2021-10-04: 22:00:00 25 mg via ORAL
  Filled 2021-10-04: qty 1

## 2021-10-04 MED ORDER — CEFAZOLIN SODIUM-DEXTROSE 2-4 GM/100ML-% IV SOLN
2.0000 g | Freq: Three times a day (TID) | INTRAVENOUS | Status: DC
  Administered 2021-10-04 – 2021-10-07 (×8): 2 g via INTRAVENOUS
  Filled 2021-10-04 (×12): qty 100

## 2021-10-04 MED ORDER — TIOTROPIUM BROMIDE-OLODATEROL 2.5-2.5 MCG/ACT IN AERS
2.0000 | INHALATION_SPRAY | Freq: Every day | RESPIRATORY_TRACT | Status: DC
  Administered 2021-10-04 – 2021-10-09 (×6): 2 via RESPIRATORY_TRACT

## 2021-10-04 MED ORDER — PANTOPRAZOLE SODIUM 40 MG PO TBEC
40.0000 mg | DELAYED_RELEASE_TABLET | Freq: Every day | ORAL | Status: DC
  Administered 2021-10-04 – 2021-10-09 (×6): 40 mg via ORAL
  Filled 2021-10-04 (×6): qty 1

## 2021-10-04 MED ORDER — TIOTROPIUM BROMIDE-OLODATEROL 2.5-2.5 MCG/ACT IN AERS
2.0000 | INHALATION_SPRAY | Freq: Every day | RESPIRATORY_TRACT | Status: DC
  Filled 2021-10-04 (×2): qty 4

## 2021-10-04 NOTE — ED Notes (Signed)
Pt endorsing that their leg swelling has increased. Pt stating that Md was aware as they assessed it together. Pt assisted to br

## 2021-10-04 NOTE — Consult Note (Addendum)
NAME: Sharon [name]  DOB: 03-02-1946  MRN: [id number]  Date/Time: 10/04/2021 2:02 PM  REQUESTING PROVIDER: Dr.Amery Subjective:  REASON FOR CONSULT: cellulitis left leg ? Sharon [name] is a 75 y.o. female with a history of venous/lymphedema , anxiety , left leg cellulitis on prophylactic penicillin presents with worsening erythema left leg-  pt has lymphedema/venous edema and has been followed by PCP and vascular team/ dermatologist  as OP. She ws first given doxy and then keflex for 5 days starting 09/23/21. She was also given mometasone cream, elidel cream /tacrolimus cream and compression stockings by derm. She also has compression pump On 09/26/21 she had UNNA boot wrapped by vascular surgery. She then went to PCP on 12/22 and when they unwrapped the unna the left leg ws erythematous she was sent to eD- no fever or chills Says the leg is very irchy Vitals 118/53, temp 98 and HR 88 Labs showed wbc 6.9, HB 14.6, plt 227\ She ws started on vanco and cefepime   Past Medical History:  Diagnosis Date   Anxiety    Arthritis    hands, upper back   Asthma    COPD (chronic obstructive pulmonary disease) ([place])    History of cervical cancer    Menopausal disorder    Osteoporosis    Pneumonia 1960   Spasm of abdominal muscles of right side    intermittent   Stroke ([place]) 2019   TMJ (dislocation of temporomandibular joint)    Wears dentures    partial lower    Past Surgical History:  Procedure Laterality Date   ABDOMINAL HYSTERECTOMY  1970's   bladder botox  2005   BLADDER SUSPENSION  2004   CATARACT EXTRACTION W/PHACO Right 03/09/2018   Procedure: CATARACT EXTRACTION PHACO AND INTRAOCULAR LENS PLACEMENT ([place]) right;  Surgeon: Eulogio Bear, MD;  Location: [facility];  Service: Ophthalmology;  Laterality: Right;  CALL CELL 1ST   CATARACT EXTRACTION W/PHACO Left 04/19/2018   Procedure: CATARACT EXTRACTION PHACO AND INTRAOCULAR LENS PLACEMENT ([facility])  LEFT;  Surgeon: Eulogio Bear, MD;  Location: [facility];  Service: Ophthalmology;  Laterality: Left;   COLONOSCOPY WITH PROPOFOL N/A 12/13/2015   Procedure: COLONOSCOPY WITH PROPOFOL;  Surgeon: Lucilla Lame, MD;  Location: [facility];  Service: Endoscopy;  Laterality: N/A;   COLONOSCOPY WITH PROPOFOL N/A 03/14/2021   Procedure: COLONOSCOPY WITH PROPOFOL;  Surgeon: Lucilla Lame, MD;  Location: [facility];  Service: Endoscopy;  Laterality: N/A;  diabetic   LOOP RECORDER INSERTION N/A 01/07/2018   Procedure: LOOP RECORDER INSERTION;  Surgeon: Deboraha Sprang, MD;  Location: [facility];  Service: Cardiovascular;  Laterality: N/A;   POLYPECTOMY N/A 12/13/2015   Procedure: POLYPECTOMY;  Surgeon: Lucilla Lame, MD;  Location: [facility];  Service: Endoscopy;  Laterality: N/A;  SIGMOID COLON POLYPS X  5   POLYPECTOMY N/A 03/14/2021   Procedure: POLYPECTOMY;  Surgeon: Lucilla Lame, MD;  Location: [facility];  Service: Endoscopy;  Laterality: N/A;   SHOULDER ARTHROSCOPY W/ ROTATOR CUFF REPAIR Right 1998   TEE WITHOUT CARDIOVERSION N/A 01/06/2018   Procedure: TRANSESOPHAGEAL ECHOCARDIOGRAM (TEE);  Surgeon: Minna Merritts, MD;  Location: [facility];  Service: Cardiovascular;  Laterality: N/A;   TONSILLECTOMY AND ADENOIDECTOMY      Social History   Socioeconomic History   Marital status: Married    Spouse name: Jayley Hustead   Number of children: 1   Years of education: Not on file   Highest education  level: Some college, no degree  Occupational History   Occupation: retired  Tobacco Use   Smoking status: Former    Years: 56.00    Types: Cigarettes    Quit date: 07/14/2016    Years since quitting: 5.2   Smokeless tobacco: Never  Vaping Use   Vaping Use: Former  Substance and Sexual Activity   Alcohol use: Yes    Alcohol/week: 0.0 standard drinks    Comment: occassional   Drug use: No   Sexual activity: Not Currently    Birth control/protection: Post-menopausal   Other Topics Concern   Not on file  Social History Narrative   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, manages farm   Social Determinants of Health   Financial Resource Strain: Low Risk    Difficulty of Paying Living Expenses: Not hard at all  Food Insecurity: No Food Insecurity   Worried About Charity fundraiser in the Last Year: Never true   Arboriculturist in the Last Year: Never true  Transportation Needs: No Transportation Needs   Lack of Transportation (Medical): No   Lack of Transportation (Non-Medical): No  Physical Activity: Inactive   Days of Exercise per Week: 0 days   Minutes of Exercise per Session: 0 min  Stress: Stress Concern Present   Feeling of Stress : Rather much  Social Connections: Not on file  Intimate Partner Violence: Not on file    Family History  Problem Relation Age of Onset   Diabetes Mother    Heart disease Mother    Stroke Mother    Stroke Maternal Grandmother    Allergies  Allergen Reactions   Statins Other (See Comments)    myalgia   Quinolones     FLUOROQUINOLONES - Pt reports she was told to NEVER take these.   Baclofen Swelling   Bactrim [Sulfamethoxazole-Trimethoprim] Nausea And Vomiting   Ibuprofen Rash    Mouth swelling   Latex Rash    Some bandaids, some gloves; BLOOD TEST NEGATIVE   Librium [Chlordiazepoxide] Itching    Dizziness    Naprosyn [Naproxen] Rash    Mouth swelling   Other Rash    [place] nuts - mouth swelling   I? Current Facility-Administered Medications  Medication Dose Route Frequency Provider Last Rate Last Admin   acetaminophen (TYLENOL) tablet 650 mg  650 mg Oral Q6H PRN Para Skeans, MD   650 mg at 10/04/21 7591   Or   acetaminophen (TYLENOL) suppository 650 mg  650 mg Rectal Q6H PRN Para Skeans, MD       albuterol (PROVENTIL) (2.5 MG/3ML) 0.083% nebulizer solution 2.5 mg  2.5 mg Inhalation Q6H PRN Para Skeans, MD   2.5 mg at 10/04/21 0915   bisacodyl (DULCOLAX) EC tablet 5 mg  5 mg Oral Daily PRN Para Skeans,  MD       ceFEPIme (MAXIPIME) 2 g in sodium chloride 0.9 % 100 mL IVPB  2 g Intravenous Q8H [place], Abby K, RPH 200 mL/hr at 10/04/21 1327 2 g at 10/04/21 1327   citalopram (CELEXA) tablet 20 mg  20 mg Oral Daily Para Skeans, MD       clopidogrel (PLAVIX) tablet 75 mg  75 mg Oral Daily Florina Ou V, MD   75 mg at 10/04/21 0905   docusate sodium (COLACE) capsule 100 mg  100 mg Oral BID Para Skeans, MD   100 mg at 10/04/21 0905   enoxaparin (LOVENOX) injection 47.5 mg  0.5 mg/kg Subcutaneous  Q24H Para Skeans, MD       hydrALAZINE (APRESOLINE) injection 5 mg  5 mg Intravenous Q4H PRN Para Skeans, MD       lactated ringers infusion   Intravenous Continuous Para Skeans, MD 50 mL/hr at 10/04/21 1346 New Bag at 10/04/21 1346   morphine 2 MG/ML injection 2 mg  2 mg Intravenous Q2H PRN Para Skeans, MD       ondansetron [facility]) tablet 4 mg  4 mg Oral Q6H PRN Para Skeans, MD       Or   ondansetron [facility]) injection 4 mg  4 mg Intravenous Q6H PRN Para Skeans, MD       oxyCODONE (Oxy IR/ROXICODONE) immediate release tablet 5 mg  5 mg Oral Q4H PRN Para Skeans, MD       pantoprazole (PROTONIX) EC tablet 40 mg  40 mg Oral Daily Nolberto Hanlon, MD   40 mg at 10/04/21 0945   polyethylene glycol (MIRALAX / GLYCOLAX) packet 17 g  17 g Oral Daily PRN Para Skeans, MD       sodium chloride flush (NS) 0.9 % injection 3 mL  3 mL Intravenous Q12H Florina Ou V, MD   3 mL at 10/04/21 0912   Tiotropium Bromide-Olodaterol 2.5-2.5 MCG/ACT AERS 2 puff  2 puff Inhalation Daily Nolberto Hanlon, MD   2 puff at 10/04/21 1323   vancomycin (VANCOREADY) IVPB 1250 mg/250 mL  1,250 mg Intravenous Q24H Tawnya Crook, RPH       Current Outpatient Medications  Medication Sig Dispense Refill   Alirocumab (PRALUENT) 150 MG/ML SOAJ Inject 1 mL into the skin every 14 (fourteen) days. 6 mL 1   cephALEXin (KEFLEX) 250 MG capsule Take 250 mg by mouth 4 (four) times daily.     Cholecalciferol 1.25 MG (50000 UT) TABS  Take 1 tablet by mouth once a week. 14 tablet 4   citalopram (CELEXA) 20 MG tablet Take 1 tablet (20 mg total) by mouth as needed. 90 tablet 4   clopidogrel (PLAVIX) 75 MG tablet TAKE 1 TABLET(75 MG) BY MOUTH DAILY 90 tablet 4   furosemide (LASIX) 20 MG tablet Take 0.5 tablets (10 mg total) by mouth daily. 45 tablet 4   metFORMIN (GLUCOPHAGE) 500 MG tablet TAKE 1 TABLET(500 MG) BY MOUTH DAILY WITH BREAKFAST 90 tablet 0   pantoprazole (PROTONIX) 40 MG tablet TAKE 1 TABLET(40 MG) BY MOUTH DAILY 90 tablet 0   penicillin v potassium (VEETID) 250 MG tablet Take by mouth.     SYMBICORT 160-4.5 MCG/ACT inhaler Inhale into the lungs.     Tiotropium Bromide-Olodaterol 2.5-2.5 MCG/ACT AERS Inhale 2 puffs into the lungs daily. 12 g 4   acetaminophen (TYLENOL) 500 MG tablet Take 500 mg by mouth every 6 (six) hours as needed.     Blood Glucose Monitoring Suppl (ONETOUCH VERIO) w/Device KIT Utilize to check blood sugar twice a day, fasting in morning with goal < 130 and then 2 hours after a meal with goal <180.  Document and bring to visits. 1 kit 0   Cyanocobalamin 1000 MCG/ML KIT Inject 1,000 mcg as directed every 30 (thirty) days.     glucose blood (ONETOUCH VERIO) test strip Utilize to check blood sugar twice a day, fasting in morning with goal < 130 and then 2 hours after a meal with goal <180.  Document and bring to visits. 100 each 12   Lancets (ONETOUCH ULTRASOFT) lancets Utilize to check blood sugar  twice a day, fasting in morning with goal < 130 and then 2 hours after a meal with goal <180.  Document and bring to visits. 100 each 12   loratadine (CLARITIN) 10 MG tablet Take 10 mg by mouth daily.     mometasone (ELOCON) 0.1 % cream APPLY TO LOWER LEGS TWICE DAILY UNTIL RASH IMPROVED. AVOID FACE, GROIN, UNDERARMS 45 g 0   pimecrolimus (ELIDEL) 1 % cream Apply topically as directed. Qd to bid aa rash on lower legs 60 g 4   PROAIR HFA 108 (90 Base) MCG/ACT inhaler INHALE 2 PUFFS INTO THE LUNGS EVERY 6 HOURS  AS NEEDED FOR WHEEZING OR SHORTNESS OF BREATH 8.5 g 2   tacrolimus (PROTOPIC) 0.1 % ointment Apply to lower legs 1-2 times a day until rash improved. (Patient not taking: Reported on 10/04/2021) 100 g 2     Abtx:  Anti-infectives (From admission, onward)    Start     Dose/Rate Route Frequency Ordered Stop   10/04/21 2200  vancomycin (VANCOREADY) IVPB 1250 mg/250 mL        1,250 mg 166.7 mL/hr over 90 Minutes Intravenous Every 24 hours 10/03/21 2109 10/10/21 2159   10/03/21 2200  ceFEPIme (MAXIPIME) 2 g in sodium chloride 0.9 % 100 mL IVPB        2 g 200 mL/hr over 30 Minutes Intravenous Every 8 hours 10/03/21 2109 10/10/21 2159   10/03/21 2130  vancomycin (VANCOCIN) IVPB 1000 mg/200 mL premix        1,000 mg 200 mL/hr over 60 Minutes Intravenous  Once 10/03/21 2109 10/04/21 0053   10/03/21 2100  ceFEPIme (MAXIPIME) 2 g in sodium chloride 0.9 % 100 mL IVPB  Status:  Discontinued        2 g 200 mL/hr over 30 Minutes Intravenous  Once 10/03/21 2059 10/03/21 2109   10/03/21 2030  vancomycin (VANCOCIN) IVPB 1000 mg/200 mL premix        1,000 mg 200 mL/hr over 60 Minutes Intravenous  Once 10/03/21 2025 10/03/21 2207       REVIEW OF SYSTEMS:  Const: negative fever, negative chills, negative weight loss Eyes: negative diplopia or visual changes, negative eye pain ENT: negative coryza, negative sore throat Resp: negative cough, hemoptysis, dyspnea Cards: negative for chest pain, palpitations, lower extremity edema GU: negative for frequency, dysuria and hematuria GI: Negative for abdominal pain, diarrhea, bleeding, constipation Skin: negative for rash and pruritus Heme: negative for easy bruising and gum/nose bleeding MS: negative for myalgias, arthralgias, back pain and muscle weakness Neurolo:negative for headaches, dizziness, vertigo, memory problems  Psych: f anxiety,  Endocrine:pre diabetes Allergy/Immunology- as above Objective:  VITALS:  BP 113/65    Pulse 88    Temp 98 F  (36.7 C) (Oral)    Resp 18    Ht _0  (1.753 m)    Wt 96.6 kg    LMP  (LMP Unknown)    SpO2 96%    BMI 31.45 kg/m  PHYSICAL EXAM:  General: Alert, cooperative, no distress, appears stated age.  Head: Normocephalic, without obvious abnormality, atraumatic. Eyes: Conjunctivae clear, anicteric sclerae. Pupils are equal ENT Nares normal. No drainage or sinus tenderness. Lips, mucosa, and tongue normal. No Thrush Neck: Supple, symmetrical, no adenopathy, thyroid: non tender no carotid bruit and no JVD. Back: No CVA tenderness. Lungs: Clear to auscultation bilaterally. No Wheezing or Rhonchi. No rales. Heart: Regular rate and rhythm, no murmur, rub or gallop.has loop recorder Abdomen: Soft, non-tender,not distended. Bowel sounds normal.  No masses Extremities: left leg erythematous- not more warm than rt- some scaling       Skin:bruising over both arms Lymph: Cervical, supraclavicular normal. Neurologic: Grossly non-focal Pertinent Labs Lab Results CBC    Component Value Date/Time   WBC 6.9 10/04/2021 1026   RBC 4.62 10/04/2021 1026   HGB 13.5 10/04/2021 1026   HGB 13.8 09/10/2021 1512   HCT 41.0 10/04/2021 1026   HCT 41.2 09/10/2021 1512   PLT 210 10/04/2021 1026   PLT 295 09/10/2021 1512   MCV 88.7 10/04/2021 1026   MCV 88 09/10/2021 1512   MCH 29.2 10/04/2021 1026   MCHC 32.9 10/04/2021 1026   RDW 13.3 10/04/2021 1026   RDW 12.3 09/10/2021 1512   LYMPHSABS 1.5 10/03/2021 1422   LYMPHSABS 1.8 09/10/2021 1512   MONOABS 0.7 10/03/2021 1422   EOSABS 0.2 10/03/2021 1422   EOSABS 0.1 09/10/2021 1512   BASOSABS 0.0 10/03/2021 1422   BASOSABS 0.0 09/10/2021 1512    CMP Latest Ref Rng & Units 10/04/2021 10/03/2021 09/10/2021  Glucose 70 - 99 mg/dL 123(H) 145(H) 104(H)  BUN 8 - 23 mg/dL _0 Creatinine 0.44 - 1.00 mg/dL 0.91 0.93 0.97  Sodium 135 - 145 mmol/L 137 138 145(H)  Potassium 3.5 - 5.1 mmol/L 4.5 4.1 4.2  Chloride 98 - 111 mmol/L 104 105 104  CO2 22 - 32  mmol/L _1 Calcium 8.9 - 10.3 mg/dL 9.0 9.0 9.1  Total Protein 6.5 - 8.1 g/dL 6.5 6.8 6.4  Total Bilirubin 0.3 - 1.2 mg/dL 0.7 0.8 0.3  Alkaline Phos 38 - 126 U/L 62 70 101  AST 15 - 41 U/L _2 ALT 0 - 44 U/L _3 Microbiology: Recent Results (from the past 240 hour(s))  Resp Panel by RT-PCR (Flu A&B, Covid) Nasopharyngeal Swab     Status: None   Collection Time: 10/03/21  8:52 PM   Specimen: Nasopharyngeal Swab; Nasopharyngeal(NP) swabs in vial transport medium  Result Value Ref Range Status   SARS Coronavirus 2 by RT PCR NEGATIVE NEGATIVE Final    Comment: (NOTE) SARS-CoV-2 target nucleic acids are NOT DETECTED.  The SARS-CoV-2 RNA is generally detectable in upper respiratory specimens during the acute phase of infection. The lowest concentration of SARS-CoV-2 viral copies this assay can detect is 138 copies/mL. A negative result does not preclude SARS-Cov-2 infection and should not be used as the sole basis for treatment or other patient management decisions. A negative result may occur with  improper specimen collection/handling, submission of specimen other than nasopharyngeal swab, presence of viral mutation(s) within the areas targeted by this assay, and inadequate number of viral copies(<138 copies/mL). A negative result must be combined with clinical observations, patient history, and epidemiological information. The expected result is Negative.  Fact Sheet for Patients:  EntrepreneurPulse.com.au  Fact Sheet for Healthcare Providers:  IncredibleEmployment.be  This test is no t yet approved or cleared by the [place] FDA and  has been authorized for detection and/or diagnosis of SARS-CoV-2 by FDA under an Emergency Use Authorization (EUA). This EUA will remain  in effect (meaning this test can be used) for the duration of the COVID-19 declaration under Section 564(b)(1) of the Act, 21 U.S.C.section  360bbb-3(b)(1), unless the authorization is terminated  or revoked sooner.       Influenza A by PCR NEGATIVE NEGATIVE Final   Influenza B by PCR NEGATIVE NEGATIVE Final    Comment: (  NOTE) The Xpert Xpress SARS-CoV-2/FLU/RSV plus assay is intended as an aid in the diagnosis of influenza from Nasopharyngeal swab specimens and should not be used as a sole basis for treatment. Nasal washings and aspirates are unacceptable for Xpert Xpress SARS-CoV-2/FLU/RSV testing.  Fact Sheet for Patients: EntrepreneurPulse.com.au  Fact Sheet for Healthcare Providers: IncredibleEmployment.be  This test is not yet approved or cleared by the [place] FDA and has been authorized for detection and/or diagnosis of SARS-CoV-2 by FDA under an Emergency Use Authorization (EUA). This EUA will remain in effect (meaning this test can be used) for the duration of the COVID-19 declaration under Section 564(b)(1) of the Act, 21 U.S.C. section 360bbb-3(b)(1), unless the authorization is terminated or revoked.  Performed at [facility], [address] [place], [place] [address]   Culture, blood (routine x 2)     Status: None (Preliminary result)   Collection Time: 10/03/21  9:30 PM   Specimen: BLOOD  Result Value Ref Range Status   Specimen Description BLOOD LEFT ASSIST CONTROL  Final   Special Requests   Final    BOTTLES DRAWN AEROBIC AND ANAEROBIC Blood Culture adequate volume   Culture   Final    NO GROWTH < 12 HOURS Performed at [facility], [address], [place], [place] [address]    Report Status PENDING  Incomplete  Culture, blood (routine x 2)     Status: None (Preliminary result)   Collection Time: 10/03/21  9:30 PM   Specimen: BLOOD  Result Value Ref Range Status   Specimen Description BLOOD RIGHT FOREARM  Final   Special Requests   Final    BOTTLES DRAWN AEROBIC AND ANAEROBIC Blood Culture results may not be optimal due to an  inadequate volume of blood received in culture bottles   Culture   Final    NO GROWTH < 12 HOURS Performed at [facility], [address] [place], [place] [address]    Report Status PENDING  Incomplete    IMAGING RESULTS: I have personally reviewed the films ? Impression/Recommendation ? ?pt with venous edema/stasis presents with eryhtema to the leg Pt has been using unna wraps, also has applied elidel before At first blush it looks like cellulitis but with no fever or leucocytosis She has itching which raises concern for  early contact dermatitis This is not MRSA infection No purulent cellulitis So DC vanco and cefepime Change to IV cefazolin. No need for clindamycin ( for antitoxin currently) Keep leg elevated Pt is stable and does not look septic She had high BP on presentation and then in the night was low. Normal lactate at presentation and this morning was 2.5   H/o CVA on plavix   Discussed the management with patient and hospitalist ID will follow her peripherally this weekend- call if needed ? _____________________________________________ Note:  This document was prepared using Dragon voice recognition software and may include unintentional dictation errors.

## 2021-10-04 NOTE — TOC CM/SW Note (Signed)
TOC acknowledges consult for possible home IV medication needs at time of DC. Notified Pam with [facility] of possible need. TOC will continue to follow and arrange if needed.  Oleh Genin, [place]

## 2021-10-04 NOTE — ED Notes (Signed)
Pt watching tv, co left leg pain rates it 6/10 at this time. Pt awaiting hospital room availability.

## 2021-10-04 NOTE — ED Notes (Addendum)
Pt endorsing leg pain

## 2021-10-04 NOTE — ED Notes (Signed)
Pt watching TV, no complaints at this time.

## 2021-10-04 NOTE — ED Notes (Signed)
Attending messages about patient lactic acid lvl

## 2021-10-04 NOTE — Consult Note (Signed)
[facility] Nurse Consult Note: Reason for Consult: Patient with RLE erythema above knee, edema from toes to knee, warmth.  No wound.  Healed wound from past encounters at outpatient [facility].  Denies cat scratch.  Small scratch sustained from "thorny stick that may have punctured pant leg" on Saturday, 12/10. LLE with medial area of erythema, no induration, warmth.  Wound type: N/A Pressure Injury POA: N/A Measurement:N/A Wound bed:N/A Drainage (amount, consistency, odor) N/A Periwound: N/A Dressing procedure/placement/frequency: No wound.  No topical care is indicated.  Patient is on systemic antibiotics.  Heels to be elevated to prevent pressure injury, guidance for Nursing is provided.  [place] nursing team will not follow, but will remain available to this patient, the nursing and medical teams.  Please re-consult if needed. Thanks, Maudie Flakes, MSN, RN, [place], Arther Abbott  Pager# [phone]

## 2021-10-04 NOTE — Progress Notes (Signed)
PROGRESS NOTE    Sharon [name]  YCX:[id number] DOB: 28-Apr-1946 DOA: 10/03/2021 PCP: [name], NP    Brief Narrative:  Sharon [name] is a 75 y.o. female seen in ed with complaints of left leg redness and swelling. Pt reports it has been going on since two weeks    Consultants:    Procedures:   Antimicrobials:  Vanco and cefepime    Subjective: Feels redness has progressed upwards above the knee still with pain has swelling  Objective: Vitals:   10/04/21 1400 10/04/21 1515 10/04/21 1516 10/04/21 1624  BP: (!) 111/57 (!) 124/52  (!) 143/71  Pulse: 84 98 89 85  Resp:   20 20  Temp:   98.2 F (36.8 C) 98 F (36.7 C)  TempSrc:    Oral  SpO2: 92% 97% 98% 91%  Weight:      Height:        Intake/Output Summary (Last 24 hours) at 10/04/2021 1652 Last data filed at 10/04/2021 0600 Gross per 24 hour  Intake 400 ml  Output --  Net 400 ml   Filed Weights   10/03/21 1421  Weight: 96.6 kg    Examination:  General exam: Appears calm and comfortable  Respiratory system: Clear to auscultation. Respiratory effort normal. Cardiovascular system: S1 & S2 heard, RRR. No JVD, murmurs, rubs, gallops or clicks.  Gastrointestinal system: Abdomen is nondistended, soft and nontender. No organomegaly or masses felt. Normal bowel sounds heard. Central nervous system: Alert and oriented. No focal neurological deficits. Extremities: Right extremity with intense redness, swelling, redness above the knee.  Tight.  Right lower extremity with mild edema and faint pinkish color Psychiatry: Judgement and insight appear normal. Mood & affect appropriate.     Data Reviewed: I have personally reviewed following labs and imaging studies  CBC: Recent Labs  Lab 10/03/21 1422 10/04/21 1026  WBC 6.9 6.9  NEUTROABS 4.4  --   HGB 14.6 13.5  HCT 44.1 41.0  MCV 89.3 88.7  PLT 227 497   Basic Metabolic Panel: Recent Labs  Lab 10/03/21 1422 10/04/21 1026  NA 138 137  K 4.1 4.5   CL 105 104  CO2 26 27  GLUCOSE 145* 123*  BUN 16 16  CREATININE 0.93 0.91  CALCIUM 9.0 9.0   GFR: Estimated Creatinine Clearance: 66.1 mL/min (by C-G formula based on SCr of 0.91 mg/dL). Liver Function Tests: Recent Labs  Lab 10/03/21 1422 10/04/21 1026  AST 22 22  ALT 18 17  ALKPHOS 70 62  BILITOT 0.8 0.7  PROT 6.8 6.5  ALBUMIN 3.8 3.6   No results for input(s): LIPASE, AMYLASE in the last 168 hours. No results for input(s): AMMONIA in the last 168 hours. Coagulation Profile: No results for input(s): INR, PROTIME in the last 168 hours. Cardiac Enzymes: No results for input(s): CKTOTAL, CKMB, CKMBINDEX, TROPONINI in the last 168 hours. BNP (last 3 results) No results for input(s): PROBNP in the last 8760 hours. HbA1C: No results for input(s): HGBA1C in the last 72 hours. CBG: No results for input(s): GLUCAP in the last 168 hours. Lipid Profile: No results for input(s): CHOL, HDL, LDLCALC, TRIG, CHOLHDL, LDLDIRECT in the last 72 hours. Thyroid Function Tests: No results for input(s): TSH, T4TOTAL, FREET4, T3FREE, THYROIDAB in the last 72 hours. Anemia Panel: No results for input(s): VITAMINB12, FOLATE, FERRITIN, TIBC, IRON, RETICCTPCT in the last 72 hours. Sepsis Labs: Recent Labs  Lab 10/03/21 1422 10/04/21 1026  LATICACIDVEN 1.2 2.5*  Recent Results (from the past 240 hour(s))  Resp Panel by RT-PCR (Flu A&B, Covid) Nasopharyngeal Swab     Status: None   Collection Time: 10/03/21  8:52 PM   Specimen: Nasopharyngeal Swab; Nasopharyngeal(NP) swabs in vial transport medium  Result Value Ref Range Status   SARS Coronavirus 2 by RT PCR NEGATIVE NEGATIVE Final    Comment: (NOTE) SARS-CoV-2 target nucleic acids are NOT DETECTED.  The SARS-CoV-2 RNA is generally detectable in upper respiratory specimens during the acute phase of infection. The lowest concentration of SARS-CoV-2 viral copies this assay can detect is 138 copies/mL. A negative result does not  preclude SARS-Cov-2 infection and should not be used as the sole basis for treatment or other patient management decisions. A negative result may occur with  improper specimen collection/handling, submission of specimen other than nasopharyngeal swab, presence of viral mutation(s) within the areas targeted by this assay, and inadequate number of viral copies(<138 copies/mL). A negative result must be combined with clinical observations, patient history, and epidemiological information. The expected result is Negative.  Fact Sheet for Patients:  EntrepreneurPulse.com.au  Fact Sheet for Healthcare Providers:  IncredibleEmployment.be  This test is no t yet approved or cleared by the [place] FDA and  has been authorized for detection and/or diagnosis of SARS-CoV-2 by FDA under an Emergency Use Authorization (EUA). This EUA will remain  in effect (meaning this test can be used) for the duration of the COVID-19 declaration under Section 564(b)(1) of the Act, 21 U.S.C.section 360bbb-3(b)(1), unless the authorization is terminated  or revoked sooner.       Influenza A by PCR NEGATIVE NEGATIVE Final   Influenza B by PCR NEGATIVE NEGATIVE Final    Comment: (NOTE) The Xpert Xpress SARS-CoV-2/FLU/RSV plus assay is intended as an aid in the diagnosis of influenza from Nasopharyngeal swab specimens and should not be used as a sole basis for treatment. Nasal washings and aspirates are unacceptable for Xpert Xpress SARS-CoV-2/FLU/RSV testing.  Fact Sheet for Patients: EntrepreneurPulse.com.au  Fact Sheet for Healthcare Providers: IncredibleEmployment.be  This test is not yet approved or cleared by the [place] FDA and has been authorized for detection and/or diagnosis of SARS-CoV-2 by FDA under an Emergency Use Authorization (EUA). This EUA will remain in effect (meaning this test can be used) for the  duration of the COVID-19 declaration under Section 564(b)(1) of the Act, 21 U.S.C. section 360bbb-3(b)(1), unless the authorization is terminated or revoked.  Performed at [facility], [address] [place], [place] [address]   Culture, blood (routine x 2)     Status: None (Preliminary result)   Collection Time: 10/03/21  9:30 PM   Specimen: BLOOD  Result Value Ref Range Status   Specimen Description BLOOD LEFT ASSIST CONTROL  Final   Special Requests   Final    BOTTLES DRAWN AEROBIC AND ANAEROBIC Blood Culture adequate volume   Culture   Final    NO GROWTH < 12 HOURS Performed at [facility], [address], [place], [place] [address]    Report Status PENDING  Incomplete  Culture, blood (routine x 2)     Status: None (Preliminary result)   Collection Time: 10/03/21  9:30 PM   Specimen: BLOOD  Result Value Ref Range Status   Specimen Description BLOOD RIGHT FOREARM  Final   Special Requests   Final    BOTTLES DRAWN AEROBIC AND ANAEROBIC Blood Culture results may not be optimal due to an inadequate volume of blood received in  culture bottles   Culture   Final    NO GROWTH < 12 HOURS Performed at [facility], [address] [place], [place] [address]    Report Status PENDING  Incomplete         Radiology Studies: DG Chest 2 View  Result Date: 10/03/2021 CLINICAL DATA:  Left leg infection EXAM: CHEST - 2 VIEW COMPARISON:  06/06/2021 FINDINGS: Hyperinflation. Heart and mediastinal contours are within normal limits. No focal opacities or effusions. No acute bony abnormality. Left chest wall loop recorder device noted. IMPRESSION: Hyperinflation.  No active cardiopulmonary disease. Electronically Signed   By: Rolm Baptise M.D.   On: 10/03/2021 14:57        Scheduled Meds:  citalopram  20 mg Oral Daily   clopidogrel  75 mg Oral Daily   docusate sodium  100 mg Oral BID   enoxaparin (LOVENOX) injection  0.5 mg/kg Subcutaneous Q24H    insulin aspart  0-15 Units Subcutaneous Q4H   pantoprazole  40 mg Oral Daily   sodium chloride flush  3 mL Intravenous Q12H   Tiotropium Bromide-Olodaterol  2 puff Inhalation Daily   Continuous Infusions:  ceFEPime (MAXIPIME) IV Stopped (10/04/21 1513)   lactated ringers 50 mL/hr at 10/04/21 1346   vancomycin      Assessment & Plan:   Principal Problem:   Cellulitis Active Problems:   CAD (coronary artery disease)   History of CVA (cerebrovascular accident)   Type 2 diabetes mellitus with obesity ([place])   Hyperlipidemia associated with type 2 diabetes mellitus ([place])   Cellulitis of left leg   Cellulitis of left leg: Per patient and her erythema has progressed since admission Will consult ID Continue IV vancomycin and cefepime Asked patient to keep legs elevated Had venous Doppler that was negative for DVT on 12/13   CAD: Continue home Plavix     H/O CVA: On Plavix   DM II; Hold p.o. meds  Check A1c  Continue R-ISS         DVT prophylaxis: Lovenox Code Status: Full husband at bedside Family Communication:  Disposition Plan:  Status is: Inpatient  Remains inpatient appropriate because: IV treatment            LOS: 1 day   Time spent: 35 minutes with more than 50% on [place], MD [facility] Pager [phone]  If 7PM-7AM, please contact night-coverage 10/04/2021, 4:52 PM

## 2021-10-04 NOTE — ED Notes (Signed)
Pt asking if dopplers are to be ordered for DVT. Md response "NO" pt informed

## 2021-10-04 NOTE — ED Notes (Signed)
Report received from Santia RN. Patient care assumed. Patient/RN introduction complete. Will continue to monitor.  °

## 2021-10-05 DIAGNOSIS — L03116 Cellulitis of left lower limb: Secondary | ICD-10-CM | POA: Diagnosis not present

## 2021-10-05 DIAGNOSIS — Z8673 Personal history of transient ischemic attack (TIA), and cerebral infarction without residual deficits: Secondary | ICD-10-CM | POA: Diagnosis not present

## 2021-10-05 DIAGNOSIS — L03115 Cellulitis of right lower limb: Secondary | ICD-10-CM | POA: Diagnosis not present

## 2021-10-05 DIAGNOSIS — I2510 Atherosclerotic heart disease of native coronary artery without angina pectoris: Secondary | ICD-10-CM | POA: Diagnosis not present

## 2021-10-05 LAB — GLUCOSE, CAPILLARY
Glucose-Capillary: 120 mg/dL — ABNORMAL HIGH (ref 70–99)
Glucose-Capillary: 126 mg/dL — ABNORMAL HIGH (ref 70–99)
Glucose-Capillary: 129 mg/dL — ABNORMAL HIGH (ref 70–99)
Glucose-Capillary: 159 mg/dL — ABNORMAL HIGH (ref 70–99)
Glucose-Capillary: 79 mg/dL (ref 70–99)
Glucose-Capillary: 84 mg/dL (ref 70–99)
Glucose-Capillary: 97 mg/dL (ref 70–99)

## 2021-10-05 LAB — CREATININE, SERUM
Creatinine, Ser: 0.87 mg/dL (ref 0.44–1.00)
GFR, Estimated: >60 mL/min

## 2021-10-05 LAB — LACTIC ACID, PLASMA: Lactic Acid, Venous: 1.4 mmol/L (ref 0.5–1.9)

## 2021-10-05 MED ORDER — DIPHENHYDRAMINE HCL 25 MG PO CAPS
25.0000 mg | ORAL_CAPSULE | Freq: Once | ORAL | Status: AC
  Administered 2021-10-05: 05:00:00 25 mg via ORAL
  Filled 2021-10-05: qty 1

## 2021-10-05 NOTE — Progress Notes (Signed)
PROGRESS NOTE    Sharon [name]  [id number] DOB: 11-12-1945 DOA: 10/03/2021 PCP: [name], NP    Brief Narrative:  Sharon [name] is a 75 y.o. female seen in ed with complaints of left leg redness and swelling. Pt reports it has been going on since two weeks  12/24 lower extremity still with pain and swelling.  No new complaints  Consultants:    Procedures:   Antimicrobials:  Vanco and cefepime    Subjective: Does feel the redness is slowly improving.  No shortness of breath or chest pain  Objective: Vitals:   10/04/21 1624 10/04/21 1958 10/05/21 0350 10/05/21 0806  BP: (!) 143/71 (!) 120/57 (!) 113/52 123/61  Pulse: 85 91 76 73  Resp: 20 16 18 18   Temp: 98 F (36.7 C) 97.7 F (36.5 C) 97.8 F (36.6 C) 98 F (36.7 C)  TempSrc: Oral Oral Oral   SpO2: 91% 100% 97% 98%  Weight:      Height:        Intake/Output Summary (Last 24 hours) at 10/05/2021 0825 Last data filed at 10/05/2021 0400 Gross per 24 hour  Intake 2233.67 ml  Output --  Net 2233.67 ml   Filed Weights   10/03/21 1421  Weight: 96.6 kg    Examination:  Calm, NAD CTA no wheezing Regular S1-S2 no gallops Soft benign positive bowel sounds Left lower extremity still with tense redness but less than yesterday, starting to improve very slowly +edema. [id number]    Data Reviewed: I have personally reviewed following labs and imaging studies  CBC: Recent Labs  Lab 10/03/21 1422 10/04/21 1026  WBC 6.9 6.9  NEUTROABS 4.4  --   HGB 14.6 13.5  HCT 44.1 41.0  MCV 89.3 88.7  PLT 227 440   Basic Metabolic Panel: Recent Labs  Lab 10/03/21 1422 10/04/21 1026 10/05/21 0342  NA 138 137  --   K 4.1 4.5  --   CL 105 104  --   CO2 26 27  --   GLUCOSE 145* 123*  --   BUN 16 16  --   CREATININE 0.93 0.91 0.87  CALCIUM 9.0 9.0  --    GFR: Estimated Creatinine Clearance: 69.2 mL/min (by C-G formula based on SCr of 0.87 mg/dL). Liver Function Tests: Recent Labs  Lab  10/03/21 1422 10/04/21 1026  AST 22 22  ALT 18 17  ALKPHOS 70 62  BILITOT 0.8 0.7  PROT 6.8 6.5  ALBUMIN 3.8 3.6   No results for input(s): LIPASE, AMYLASE in the last 168 hours. No results for input(s): AMMONIA in the last 168 hours. Coagulation Profile: No results for input(s): INR, PROTIME in the last 168 hours. Cardiac Enzymes: No results for input(s): CKTOTAL, CKMB, CKMBINDEX, TROPONINI in the last 168 hours. BNP (last 3 results) No results for input(s): PROBNP in the last 8760 hours. HbA1C: No results for input(s): HGBA1C in the last 72 hours. CBG: Recent Labs  Lab 10/04/21 1810 10/04/21 2001 10/05/21 0003 10/05/21 0347 10/05/21 0803  GLUCAP 109* 188* 84 97 129*   Lipid Profile: No results for input(s): CHOL, HDL, LDLCALC, TRIG, CHOLHDL, LDLDIRECT in the last 72 hours. Thyroid Function Tests: No results for input(s): TSH, T4TOTAL, FREET4, T3FREE, THYROIDAB in the last 72 hours. Anemia Panel: No results for input(s): VITAMINB12, FOLATE, FERRITIN, TIBC, IRON, RETICCTPCT in the last 72 hours. Sepsis Labs: Recent Labs  Lab 10/03/21 1422 10/04/21 1026  LATICACIDVEN 1.2 2.5*    Recent Results (from the  past 240 hour(s))  Resp Panel by RT-PCR (Flu A&B, Covid) Nasopharyngeal Swab     Status: None   Collection Time: 10/03/21  8:52 PM   Specimen: Nasopharyngeal Swab; Nasopharyngeal(NP) swabs in vial transport medium  Result Value Ref Range Status   SARS Coronavirus 2 by RT PCR NEGATIVE NEGATIVE Final    Comment: (NOTE) SARS-CoV-2 target nucleic acids are NOT DETECTED.  The SARS-CoV-2 RNA is generally detectable in upper respiratory specimens during the acute phase of infection. The lowest concentration of SARS-CoV-2 viral copies this assay can detect is 138 copies/mL. A negative result does not preclude SARS-Cov-2 infection and should not be used as the sole basis for treatment or other patient management decisions. A negative result may occur with  improper  specimen collection/handling, submission of specimen other than nasopharyngeal swab, presence of viral mutation(s) within the areas targeted by this assay, and inadequate number of viral copies(<138 copies/mL). A negative result must be combined with clinical observations, patient history, and epidemiological information. The expected result is Negative.  Fact Sheet for Patients:  EntrepreneurPulse.com.au  Fact Sheet for Healthcare Providers:  IncredibleEmployment.be  This test is no t yet approved or cleared by the [place] FDA and  has been authorized for detection and/or diagnosis of SARS-CoV-2 by FDA under an Emergency Use Authorization (EUA). This EUA will remain  in effect (meaning this test can be used) for the duration of the COVID-19 declaration under Section 564(b)(1) of the Act, 21 U.S.C.section 360bbb-3(b)(1), unless the authorization is terminated  or revoked sooner.       Influenza A by PCR NEGATIVE NEGATIVE Final   Influenza B by PCR NEGATIVE NEGATIVE Final    Comment: (NOTE) The Xpert Xpress SARS-CoV-2/FLU/RSV plus assay is intended as an aid in the diagnosis of influenza from Nasopharyngeal swab specimens and should not be used as a sole basis for treatment. Nasal washings and aspirates are unacceptable for Xpert Xpress SARS-CoV-2/FLU/RSV testing.  Fact Sheet for Patients: EntrepreneurPulse.com.au  Fact Sheet for Healthcare Providers: IncredibleEmployment.be  This test is not yet approved or cleared by the [place] FDA and has been authorized for detection and/or diagnosis of SARS-CoV-2 by FDA under an Emergency Use Authorization (EUA). This EUA will remain in effect (meaning this test can be used) for the duration of the COVID-19 declaration under Section 564(b)(1) of the Act, 21 U.S.C. section 360bbb-3(b)(1), unless the authorization is terminated or revoked.  Performed at  [facility], [address] [place], [place] [address]   Culture, blood (routine x 2)     Status: None (Preliminary result)   Collection Time: 10/03/21  9:30 PM   Specimen: BLOOD  Result Value Ref Range Status   Specimen Description BLOOD LEFT ASSIST CONTROL  Final   Special Requests   Final    BOTTLES DRAWN AEROBIC AND ANAEROBIC Blood Culture adequate volume   Culture   Final    NO GROWTH 2 DAYS Performed at [facility], [address] [place], [place] [address]    Report Status PENDING  Incomplete  Culture, blood (routine x 2)     Status: None (Preliminary result)   Collection Time: 10/03/21  9:30 PM   Specimen: BLOOD  Result Value Ref Range Status   Specimen Description BLOOD RIGHT FOREARM  Final   Special Requests   Final    BOTTLES DRAWN AEROBIC AND ANAEROBIC Blood Culture results may not be optimal due to an inadequate volume of blood received in culture bottles   Culture  Final    NO GROWTH 2 DAYS Performed at [facility], [address] [place], [place] [address]    Report Status PENDING  Incomplete         Radiology Studies: DG Chest 2 View  Result Date: 10/03/2021 CLINICAL DATA:  Left leg infection EXAM: CHEST - 2 VIEW COMPARISON:  06/06/2021 FINDINGS: Hyperinflation. Heart and mediastinal contours are within normal limits. No focal opacities or effusions. No acute bony abnormality. Left chest wall loop recorder device noted. IMPRESSION: Hyperinflation.  No active cardiopulmonary disease. Electronically Signed   By: Rolm Baptise M.D.   On: 10/03/2021 14:57        Scheduled Meds:  citalopram  20 mg Oral Daily   clopidogrel  75 mg Oral Daily   docusate sodium  100 mg Oral BID   enoxaparin (LOVENOX) injection  0.5 mg/kg Subcutaneous Q24H   insulin aspart  0-15 Units Subcutaneous Q4H   pantoprazole  40 mg Oral Daily   sodium chloride flush  3 mL Intravenous Q12H   Tiotropium Bromide-Olodaterol  2 puff Inhalation Daily    Continuous Infusions:   ceFAZolin (ANCEF) IV 2 g (10/05/21 0525)   lactated ringers 50 mL/hr at 10/04/21 1346    Assessment & Plan:   Principal Problem:   Cellulitis Active Problems:   CAD (coronary artery disease)   History of CVA (cerebrovascular accident)   Type 2 diabetes mellitus with obesity ([place])   Hyperlipidemia associated with type 2 diabetes mellitus ([place])   Cellulitis of left leg   Cellulitis of left leg: Had venous Doppler that was negative for DVT on 12/13 12/24 slowly improving IDs input was appreciated.  Patient had reported itching which raises concern for early contact dermatitis This is not MRSA infection, nonpurulent cellulitis.  Still vancomycin and cefepime were discontinued She was started on IV ceftriaxone.  No need for clindamycin Encourage patient to keep legs elevated Lactic acidosis resolved   CAD: Continue Plavix      H/O CVA:    DM II; Hold p.o. meds  12/24 BG stable A1c 6.2  11/22        DVT prophylaxis: Lovenox Code Status: Full  Family Communication: None at bedside Disposition Plan:  Status is: Inpatient  Remains inpatient appropriate because: IV treatment            LOS: 2 days   Time spent: 35 minutes with more than 50% on [place], MD [facility] Pager [phone]  If 7PM-7AM, please contact night-coverage 10/05/2021, 8:25 AM

## 2021-10-06 DIAGNOSIS — L03115 Cellulitis of right lower limb: Secondary | ICD-10-CM | POA: Diagnosis not present

## 2021-10-06 DIAGNOSIS — I2510 Atherosclerotic heart disease of native coronary artery without angina pectoris: Secondary | ICD-10-CM | POA: Diagnosis not present

## 2021-10-06 DIAGNOSIS — L03116 Cellulitis of left lower limb: Secondary | ICD-10-CM | POA: Diagnosis not present

## 2021-10-06 DIAGNOSIS — Z8673 Personal history of transient ischemic attack (TIA), and cerebral infarction without residual deficits: Secondary | ICD-10-CM | POA: Diagnosis not present

## 2021-10-06 LAB — GLUCOSE, CAPILLARY
Glucose-Capillary: 111 mg/dL — ABNORMAL HIGH (ref 70–99)
Glucose-Capillary: 114 mg/dL — ABNORMAL HIGH (ref 70–99)
Glucose-Capillary: 121 mg/dL — ABNORMAL HIGH (ref 70–99)
Glucose-Capillary: 121 mg/dL — ABNORMAL HIGH (ref 70–99)
Glucose-Capillary: 101 mg/dL — ABNORMAL HIGH (ref 70–99)

## 2021-10-06 NOTE — Progress Notes (Signed)
PROGRESS NOTE    Sharon [name]  AST:[id number] DOB: May 15, 1946 DOA: 10/03/2021 PCP: [name], NP    Brief Narrative:  Sharon [name] is a 75 y.o. female seen in ed with complaints of left leg redness and swelling. Pt reports it has been going on since two weeks  12/25 feels left lower extremity redness is improving, has been elevating her legs.      Consultants:  ID  Procedures:   Antimicrobials:  Vanco and cefepime ... Dc'd Cefazolin IV   Subjective: Does feel the redness is slowly improving.  No shortness of breath or chest pain  Objective: Vitals:   10/05/21 0806 10/05/21 1553 10/05/21 1937 10/06/21 0426  BP: 123/61 (!) 149/70 (!) 141/77 117/69  Pulse: 73 92 96 85  Resp: 18 18 20 17   Temp: 98 F (36.7 C) 98.3 F (36.8 C) 98.2 F (36.8 C) 98.5 F (36.9 C)  TempSrc:   Oral   SpO2: 98% 98% 99% 94%  Weight:      Height:        Intake/Output Summary (Last 24 hours) at 10/06/2021 9892 Last data filed at 10/05/2021 2232 Gross per 24 hour  Intake 300 ml  Output --  Net 300 ml   Filed Weights   10/03/21 1421  Weight: 96.6 kg    Examination: Calm, NAD  CTA no wheeze rales Regular S1-S2 no gallops Soft benign positive bowel sounds Mild edema of the right, decrease edema of the left lower extremity, erythema is less intense but still present, improving very slowly Awake and alert and oriented x3    Data Reviewed: I have personally reviewed following labs and imaging studies  CBC: Recent Labs  Lab 10/03/21 1422 10/04/21 1026  WBC 6.9 6.9  NEUTROABS 4.4  --   HGB 14.6 13.5  HCT 44.1 41.0  MCV 89.3 88.7  PLT 227 119   Basic Metabolic Panel: Recent Labs  Lab 10/03/21 1422 10/04/21 1026 10/05/21 0342  NA 138 137  --   K 4.1 4.5  --   CL 105 104  --   CO2 26 27  --   GLUCOSE 145* 123*  --   BUN 16 16  --   CREATININE 0.93 0.91 0.87  CALCIUM 9.0 9.0  --    GFR: Estimated Creatinine Clearance: 69.2 mL/min (by C-G formula based on  SCr of 0.87 mg/dL). Liver Function Tests: Recent Labs  Lab 10/03/21 1422 10/04/21 1026  AST 22 22  ALT 18 17  ALKPHOS 70 62  BILITOT 0.8 0.7  PROT 6.8 6.5  ALBUMIN 3.8 3.6   No results for input(s): LIPASE, AMYLASE in the last 168 hours. No results for input(s): AMMONIA in the last 168 hours. Coagulation Profile: No results for input(s): INR, PROTIME in the last 168 hours. Cardiac Enzymes: No results for input(s): CKTOTAL, CKMB, CKMBINDEX, TROPONINI in the last 168 hours. BNP (last 3 results) No results for input(s): PROBNP in the last 8760 hours. HbA1C: No results for input(s): HGBA1C in the last 72 hours. CBG: Recent Labs  Lab 10/05/21 1139 10/05/21 1557 10/05/21 1935 10/05/21 2351 10/06/21 0429  GLUCAP 126* 79 159* 120* 111*   Lipid Profile: No results for input(s): CHOL, HDL, LDLCALC, TRIG, CHOLHDL, LDLDIRECT in the last 72 hours. Thyroid Function Tests: No results for input(s): TSH, T4TOTAL, FREET4, T3FREE, THYROIDAB in the last 72 hours. Anemia Panel: No results for input(s): VITAMINB12, FOLATE, FERRITIN, TIBC, IRON, RETICCTPCT in the last 72 hours. Sepsis Labs: Recent  Labs  Lab 10/03/21 1422 10/04/21 1026 10/05/21 0910  LATICACIDVEN 1.2 2.5* 1.4    Recent Results (from the past 240 hour(s))  Resp Panel by RT-PCR (Flu A&B, Covid) Nasopharyngeal Swab     Status: None   Collection Time: 10/03/21  8:52 PM   Specimen: Nasopharyngeal Swab; Nasopharyngeal(NP) swabs in vial transport medium  Result Value Ref Range Status   SARS Coronavirus 2 by RT PCR NEGATIVE NEGATIVE Final    Comment: (NOTE) SARS-CoV-2 target nucleic acids are NOT DETECTED.  The SARS-CoV-2 RNA is generally detectable in upper respiratory specimens during the acute phase of infection. The lowest concentration of SARS-CoV-2 viral copies this assay can detect is 138 copies/mL. A negative result does not preclude SARS-Cov-2 infection and should not be used as the sole basis for treatment  or other patient management decisions. A negative result may occur with  improper specimen collection/handling, submission of specimen other than nasopharyngeal swab, presence of viral mutation(s) within the areas targeted by this assay, and inadequate number of viral copies(<138 copies/mL). A negative result must be combined with clinical observations, patient history, and epidemiological information. The expected result is Negative.  Fact Sheet for Patients:  EntrepreneurPulse.com.au  Fact Sheet for Healthcare Providers:  IncredibleEmployment.be  This test is no t yet approved or cleared by the [place] FDA and  has been authorized for detection and/or diagnosis of SARS-CoV-2 by FDA under an Emergency Use Authorization (EUA). This EUA will remain  in effect (meaning this test can be used) for the duration of the COVID-19 declaration under Section 564(b)(1) of the Act, 21 U.S.C.section 360bbb-3(b)(1), unless the authorization is terminated  or revoked sooner.       Influenza A by PCR NEGATIVE NEGATIVE Final   Influenza B by PCR NEGATIVE NEGATIVE Final    Comment: (NOTE) The Xpert Xpress SARS-CoV-2/FLU/RSV plus assay is intended as an aid in the diagnosis of influenza from Nasopharyngeal swab specimens and should not be used as a sole basis for treatment. Nasal washings and aspirates are unacceptable for Xpert Xpress SARS-CoV-2/FLU/RSV testing.  Fact Sheet for Patients: EntrepreneurPulse.com.au  Fact Sheet for Healthcare Providers: IncredibleEmployment.be  This test is not yet approved or cleared by the [place] FDA and has been authorized for detection and/or diagnosis of SARS-CoV-2 by FDA under an Emergency Use Authorization (EUA). This EUA will remain in effect (meaning this test can be used) for the duration of the COVID-19 declaration under Section 564(b)(1) of the Act, 21 U.S.C. section  360bbb-3(b)(1), unless the authorization is terminated or revoked.  Performed at [facility], [address] [place], [place] [address]   Culture, blood (routine x 2)     Status: None (Preliminary result)   Collection Time: 10/03/21  9:30 PM   Specimen: BLOOD  Result Value Ref Range Status   Specimen Description BLOOD LEFT ASSIST CONTROL  Final   Special Requests   Final    BOTTLES DRAWN AEROBIC AND ANAEROBIC Blood Culture adequate volume   Culture   Final    NO GROWTH 3 DAYS Performed at [facility], [address], [place], [place] [address]    Report Status PENDING  Incomplete  Culture, blood (routine x 2)     Status: None (Preliminary result)   Collection Time: 10/03/21  9:30 PM   Specimen: BLOOD  Result Value Ref Range Status   Specimen Description BLOOD RIGHT FOREARM  Final   Special Requests   Final    BOTTLES DRAWN AEROBIC AND ANAEROBIC  Blood Culture results may not be optimal due to an inadequate volume of blood received in culture bottles   Culture   Final    NO GROWTH 3 DAYS Performed at [facility], [address] [place], [place] [address]    Report Status PENDING  Incomplete         Radiology Studies: No results found.      Scheduled Meds:  citalopram  20 mg Oral Daily   clopidogrel  75 mg Oral Daily   docusate sodium  100 mg Oral BID   enoxaparin (LOVENOX) injection  0.5 mg/kg Subcutaneous Q24H   insulin aspart  0-15 Units Subcutaneous Q4H   pantoprazole  40 mg Oral Daily   sodium chloride flush  3 mL Intravenous Q12H   Tiotropium Bromide-Olodaterol  2 puff Inhalation Daily   Continuous Infusions:   ceFAZolin (ANCEF) IV 2 g (10/06/21 0021)    Assessment & Plan:   Principal Problem:   Cellulitis Active Problems:   CAD (coronary artery disease)   History of CVA (cerebrovascular accident)   Type 2 diabetes mellitus with obesity ([place])   Hyperlipidemia associated with type 2 diabetes mellitus ([place])    Cellulitis of left leg   Cellulitis of left leg: Had venous Doppler that was negative for DVT on 12/13 12/24 slowly improving IDs input was appreciated.  Patient had reported itching which raises concern for early contact dermatitis This is not MRSA infection, nonpurulent cellulitis.  Still vancomycin and cefepime were discontinued 12/25 lactic acid resolved  since very slowly improving  Continue IV cefazolin.  No need for clindamycin per ID.   Keep legs elevated     CAD: Continue Plavix     H/O CVA: Continue Plavix   DM II; Hold p.o. meds  12/25 BG stable  A1c 6.211/22       DVT prophylaxis: Lovenox Code Status: Full  Family Communication: None at bedside Disposition Plan:  Status is: Inpatient  Remains inpatient appropriate because: IV treatment            LOS: 3 days   Time spent: 35 minutes with more than 50% on [place], MD [facility] Pager [phone]  If 7PM-7AM, please contact night-coverage 10/06/2021, 8:12 AM

## 2021-10-06 NOTE — Plan of Care (Signed)
completed

## 2021-10-07 DIAGNOSIS — L03115 Cellulitis of right lower limb: Secondary | ICD-10-CM | POA: Diagnosis not present

## 2021-10-07 DIAGNOSIS — E1169 Type 2 diabetes mellitus with other specified complication: Secondary | ICD-10-CM | POA: Diagnosis not present

## 2021-10-07 DIAGNOSIS — Z8673 Personal history of transient ischemic attack (TIA), and cerebral infarction without residual deficits: Secondary | ICD-10-CM | POA: Diagnosis not present

## 2021-10-07 DIAGNOSIS — I2510 Atherosclerotic heart disease of native coronary artery without angina pectoris: Secondary | ICD-10-CM | POA: Diagnosis not present

## 2021-10-07 LAB — GLUCOSE, CAPILLARY
Glucose-Capillary: 101 mg/dL — ABNORMAL HIGH (ref 70–99)
Glucose-Capillary: 103 mg/dL — ABNORMAL HIGH (ref 70–99)
Glucose-Capillary: 111 mg/dL — ABNORMAL HIGH (ref 70–99)
Glucose-Capillary: 117 mg/dL — ABNORMAL HIGH (ref 70–99)
Glucose-Capillary: 130 mg/dL — ABNORMAL HIGH (ref 70–99)
Glucose-Capillary: 172 mg/dL — ABNORMAL HIGH (ref 70–99)

## 2021-10-07 MED ORDER — DIPHENHYDRAMINE HCL 50 MG/ML IJ SOLN
12.5000 mg | Freq: Four times a day (QID) | INTRAMUSCULAR | Status: DC | PRN
  Administered 2021-10-07 – 2021-10-08 (×2): 12.5 mg via INTRAVENOUS
  Filled 2021-10-07 (×2): qty 1

## 2021-10-07 MED ORDER — METHYLPREDNISOLONE SODIUM SUCC 40 MG IJ SOLR
40.0000 mg | Freq: Every day | INTRAMUSCULAR | Status: DC
Start: 2021-10-07 — End: 2021-10-09
  Administered 2021-10-07 – 2021-10-09 (×3): 40 mg via INTRAVENOUS
  Filled 2021-10-07 (×4): qty 1

## 2021-10-07 NOTE — Progress Notes (Signed)
Date of Admission:  10/03/2021     ID: Sharon [name] is a 75 y.o. female  Principal Problem:   Cellulitis Active Problems:   CAD (coronary artery disease)   History of CVA (cerebrovascular accident)   Type 2 diabetes mellitus with obesity ([place])   Hyperlipidemia associated with type 2 diabetes mellitus ([facility])   Cellulitis of left leg   Pt has itching over legs and also arms The leg is still red , but no pain, swelling better Never had fever  Medications:    citalopram  20 mg Oral Daily   clopidogrel  75 mg Oral Daily   docusate sodium  100 mg Oral BID   enoxaparin (LOVENOX) injection  0.5 mg/kg Subcutaneous Q24H   insulin aspart  0-15 Units Subcutaneous Q4H   methylPREDNISolone (SOLU-MEDROL) injection  40 mg Intravenous Daily   pantoprazole  40 mg Oral Daily   sodium chloride flush  3 mL Intravenous Q12H   Tiotropium Bromide-Olodaterol  2 puff Inhalation Daily    Objective: Vital signs in last 24 hours: Temp:  [97.7 F (36.5 C)-98.7 F (37.1 C)] 97.7 F (36.5 C) (12/26 1524) Pulse Rate:  [76-92] 88 (12/26 1524) Resp:  [15-20] 16 (12/26 1524) BP: (99-134)/(57-75) 131/75 (12/26 1524) SpO2:  [95 %-100 %] 100 % (12/26 1524)  PHYSICAL EXAM:  General: Alert, cooperative, no distress, appears stated age.  Head: Normocephalic, without obvious abnormality, atraumatic. Eyes: Conjunctivae clear, anicteric sclerae. Pupils are equal ENT Nares normal. No drainage or sinus tenderness. Lips, mucosa, and tongue normal. No Thrush Neck: Supple, symmetrical, no adenopathy, thyroid: non tender no carotid bruit and no JVD. Back: No CVA tenderness. Lungs: Clear to auscultation bilaterally. No Wheezing or Rhonchi. No rales. Heart: Regular rate and rhythm, no murmur, rub or gallop. Abdomen: Soft, non-tender,not distended. Bowel sounds normal. No masses Extremities:  B/l leg venous stasis- erythema left leg         Skin: erythematous areas  Lymph: Cervical, supraclavicular  normal. Neurologic: Grossly non-focal  Lab Results CBC Latest Ref Rng & Units 10/04/2021 10/03/2021 09/10/2021  WBC 4.0 - 10.5 K/uL 6.9 6.9 8.0  Hemoglobin 12.0 - 15.0 g/dL 13.5 14.6 13.8  Hematocrit 36.0 - 46.0 % 41.0 44.1 41.2  Platelets 150 - 400 K/uL 210 227 295    CMP Latest Ref Rng & Units 10/05/2021 10/04/2021 10/03/2021  Glucose 70 - 99 mg/dL - 123(H) 145(H)  BUN 8 - 23 mg/dL - 16 16  Creatinine 0.44 - 1.00 mg/dL 0.87 0.91 0.93  Sodium 135 - 145 mmol/L - 137 138  Potassium 3.5 - 5.1 mmol/L - 4.5 4.1  Chloride 98 - 111 mmol/L - 104 105  CO2 22 - 32 mmol/L - 27 26  Calcium 8.9 - 10.3 mg/dL - 9.0 9.0  Total Protein 6.5 - 8.1 g/dL - 6.5 6.8  Total Bilirubin 0.3 - 1.2 mg/dL - 0.7 0.8  Alkaline Phos 38 - 126 U/L - 62 70  AST 15 - 41 U/L - 22 22  ALT 0 - 44 U/L - 17 18    Microbiology: 10/03/21 BC NG    Assessment/Plan: ?pt with venous edema/stasis presents with erythema of the left leg Pt has been using unna wraps, also has applied elidel before At first blush it looks like cellulitis but with no fever or leucocytosis and no significant response to cefazolin doubt it is  She has itching which raises concern for  early contact dermatitis  Also has some vague erythematous areas over arms  No purulent cellulitis So DC vanco and cefepime on 12/23 She has been on IV cefazolin for 3 days Will DC today to see whether this could be an allergic reation to antibiotic VS topical cream Keep leg elevated Pt is stable and does not look septic    H/o CVA on plavix     Discussed the management with the patient and Dr.Amery

## 2021-10-07 NOTE — Progress Notes (Signed)
Patient ID: Sharon [name], female   DOB: 1945-12-07, 75 y.o.   MRN: [id number] Spoke to ID via chat, recommended stopping abx as this is likely not cellulitis. To observe off abx. For now

## 2021-10-07 NOTE — Progress Notes (Signed)
PROGRESS NOTE    Sharon [name]  SHF:[id number] DOB: 1946-01-25 DOA: 10/03/2021 PCP: [name], NP    Brief Narrative:  Sharon [name] is a 75 y.o. female seen in ed with complaints of left leg redness and swelling. Pt reports it has been going on since two weeks  12/25 feels left lower extremity redness is improving, has been elevating her legs.   12/26 slow improvement. Keeps legs elevated.   Consultants:  ID  Procedures:   Antimicrobials:  Vanco and cefepime ... Dc'd Cefazolin IV   Subjective: No sob, cp, abd pain.+bs  Objective: Vitals:   10/06/21 1556 10/06/21 2040 10/07/21 0439 10/07/21 0749  BP: (!) 144/66 134/68 (!) 99/57 (!) 109/58  Pulse: 93 88 76 92  Resp: 18 16 15 20   Temp: 97.8 F (36.6 C) 97.9 F (36.6 C) 98.7 F (37.1 C) 98 F (36.7 C)  TempSrc: Oral Oral Oral Oral  SpO2: 95% 98% 98% 95%  Weight:      Height:        Intake/Output Summary (Last 24 hours) at 10/07/2021 0839 Last data filed at 10/06/2021 1902 Gross per 24 hour  Intake 1320 ml  Output --  Net 1320 ml   Filed Weights   10/03/21 1421  Weight: 96.6 kg    Examination: Calm, nad Cta no w/r Reg s1/s2 no gallop Soft benign +bs No edema [id number]    Data Reviewed: I have personally reviewed following labs and imaging studies  CBC: Recent Labs  Lab 10/03/21 1422 10/04/21 1026  WBC 6.9 6.9  NEUTROABS 4.4  --   HGB 14.6 13.5  HCT 44.1 41.0  MCV 89.3 88.7  PLT 227 502   Basic Metabolic Panel: Recent Labs  Lab 10/03/21 1422 10/04/21 1026 10/05/21 0342  NA 138 137  --   K 4.1 4.5  --   CL 105 104  --   CO2 26 27  --   GLUCOSE 145* 123*  --   BUN 16 16  --   CREATININE 0.93 0.91 0.87  CALCIUM 9.0 9.0  --    GFR: Estimated Creatinine Clearance: 69.2 mL/min (by C-G formula based on SCr of 0.87 mg/dL). Liver Function Tests: Recent Labs  Lab 10/03/21 1422 10/04/21 1026  AST 22 22  ALT 18 17  ALKPHOS 70 62  BILITOT 0.8 0.7  PROT 6.8 6.5  ALBUMIN  3.8 3.6   No results for input(s): LIPASE, AMYLASE in the last 168 hours. No results for input(s): AMMONIA in the last 168 hours. Coagulation Profile: No results for input(s): INR, PROTIME in the last 168 hours. Cardiac Enzymes: No results for input(s): CKTOTAL, CKMB, CKMBINDEX, TROPONINI in the last 168 hours. BNP (last 3 results) No results for input(s): PROBNP in the last 8760 hours. HbA1C: No results for input(s): HGBA1C in the last 72 hours. CBG: Recent Labs  Lab 10/06/21 1644 10/06/21 2055 10/07/21 0024 10/07/21 0442 10/07/21 0746  GLUCAP 121* 101* 103* 101* 117*   Lipid Profile: No results for input(s): CHOL, HDL, LDLCALC, TRIG, CHOLHDL, LDLDIRECT in the last 72 hours. Thyroid Function Tests: No results for input(s): TSH, T4TOTAL, FREET4, T3FREE, THYROIDAB in the last 72 hours. Anemia Panel: No results for input(s): VITAMINB12, FOLATE, FERRITIN, TIBC, IRON, RETICCTPCT in the last 72 hours. Sepsis Labs: Recent Labs  Lab 10/03/21 1422 10/04/21 1026 10/05/21 0910  LATICACIDVEN 1.2 2.5* 1.4    Recent Results (from the past 240 hour(s))  Resp Panel by RT-PCR (Flu A&B, Covid) Nasopharyngeal  Swab     Status: None   Collection Time: 10/03/21  8:52 PM   Specimen: Nasopharyngeal Swab; Nasopharyngeal(NP) swabs in vial transport medium  Result Value Ref Range Status   SARS Coronavirus 2 by RT PCR NEGATIVE NEGATIVE Final    Comment: (NOTE) SARS-CoV-2 target nucleic acids are NOT DETECTED.  The SARS-CoV-2 RNA is generally detectable in upper respiratory specimens during the acute phase of infection. The lowest concentration of SARS-CoV-2 viral copies this assay can detect is 138 copies/mL. A negative result does not preclude SARS-Cov-2 infection and should not be used as the sole basis for treatment or other patient management decisions. A negative result may occur with  improper specimen collection/handling, submission of specimen other than nasopharyngeal swab,  presence of viral mutation(s) within the areas targeted by this assay, and inadequate number of viral copies(<138 copies/mL). A negative result must be combined with clinical observations, patient history, and epidemiological information. The expected result is Negative.  Fact Sheet for Patients:  EntrepreneurPulse.com.au  Fact Sheet for Healthcare Providers:  IncredibleEmployment.be  This test is no t yet approved or cleared by the [place] FDA and  has been authorized for detection and/or diagnosis of SARS-CoV-2 by FDA under an Emergency Use Authorization (EUA). This EUA will remain  in effect (meaning this test can be used) for the duration of the COVID-19 declaration under Section 564(b)(1) of the Act, 21 U.S.C.section 360bbb-3(b)(1), unless the authorization is terminated  or revoked sooner.       Influenza A by PCR NEGATIVE NEGATIVE Final   Influenza B by PCR NEGATIVE NEGATIVE Final    Comment: (NOTE) The Xpert Xpress SARS-CoV-2/FLU/RSV plus assay is intended as an aid in the diagnosis of influenza from Nasopharyngeal swab specimens and should not be used as a sole basis for treatment. Nasal washings and aspirates are unacceptable for Xpert Xpress SARS-CoV-2/FLU/RSV testing.  Fact Sheet for Patients: EntrepreneurPulse.com.au  Fact Sheet for Healthcare Providers: IncredibleEmployment.be  This test is not yet approved or cleared by the [place] FDA and has been authorized for detection and/or diagnosis of SARS-CoV-2 by FDA under an Emergency Use Authorization (EUA). This EUA will remain in effect (meaning this test can be used) for the duration of the COVID-19 declaration under Section 564(b)(1) of the Act, 21 U.S.C. section 360bbb-3(b)(1), unless the authorization is terminated or revoked.  Performed at [facility], [address] [place], [place] [address]   Culture, blood  (routine x 2)     Status: None (Preliminary result)   Collection Time: 10/03/21  9:30 PM   Specimen: BLOOD  Result Value Ref Range Status   Specimen Description BLOOD LEFT ASSIST CONTROL  Final   Special Requests   Final    BOTTLES DRAWN AEROBIC AND ANAEROBIC Blood Culture adequate volume   Culture   Final    NO GROWTH 3 DAYS Performed at [facility], [address], [place], [place] [address]    Report Status PENDING  Incomplete  Culture, blood (routine x 2)     Status: None (Preliminary result)   Collection Time: 10/03/21  9:30 PM   Specimen: BLOOD  Result Value Ref Range Status   Specimen Description BLOOD RIGHT FOREARM  Final   Special Requests   Final    BOTTLES DRAWN AEROBIC AND ANAEROBIC Blood Culture results may not be optimal due to an inadequate volume of blood received in culture bottles   Culture   Final    NO GROWTH 3 DAYS Performed at  [facility], [address], [place], [place] [address]    Report Status PENDING  Incomplete         Radiology Studies: No results found.      Scheduled Meds:  citalopram  20 mg Oral Daily   clopidogrel  75 mg Oral Daily   docusate sodium  100 mg Oral BID   enoxaparin (LOVENOX) injection  0.5 mg/kg Subcutaneous Q24H   insulin aspart  0-15 Units Subcutaneous Q4H   pantoprazole  40 mg Oral Daily   sodium chloride flush  3 mL Intravenous Q12H   Tiotropium Bromide-Olodaterol  2 puff Inhalation Daily   Continuous Infusions:   ceFAZolin (ANCEF) IV 2 g (10/07/21 0128)    Assessment & Plan:   Principal Problem:   Cellulitis Active Problems:   CAD (coronary artery disease)   History of CVA (cerebrovascular accident)   Type 2 diabetes mellitus with obesity ([place])   Hyperlipidemia associated with type 2 diabetes mellitus ([place])   Cellulitis of left leg   Cellulitis of left leg: Had venous Doppler that was negative for DVT on 12/13 12/24 slowly improving IDs input was appreciated.  Patient had  reported itching which raises concern for early contact dermatitis This is not MRSA infection, nonpurulent cellulitis.  Still vancomycin and cefepime were discontinued  lactic acid resolved  12/26 slow improvement.  Continue iv cefazolin. No need for clindamycin per ID Continue leg elevation PT consult     CAD: Continue plavix      H/O CVA: Continue plavix    DM II; Hold p.o. meds  A1c 6.211/22  12/26 BG stable      DVT prophylaxis: Lovenox Code Status: Full  Family Communication: None at bedside Disposition Plan:  Status is: Inpatient  Remains inpatient appropriate because: IV treatment            LOS: 4 days   Time spent: 35 minutes with more than 50% on [place], MD [facility] Pager [phone]  If 7PM-7AM, please contact night-coverage 10/07/2021, 8:39 AM

## 2021-10-07 NOTE — Care Management Important Message (Signed)
Important Message  Patient Details  Name: Sharon [name] MRN: [id number] Date of Birth: 09-Aug-1946   Medicare Important Message Given:  Yes     Dannette Barbara 10/07/2021, 12:37 PM

## 2021-10-08 ENCOUNTER — Telehealth: Payer: Self-pay | Admitting: Nurse Practitioner

## 2021-10-08 DIAGNOSIS — I2510 Atherosclerotic heart disease of native coronary artery without angina pectoris: Secondary | ICD-10-CM | POA: Diagnosis not present

## 2021-10-08 DIAGNOSIS — L03116 Cellulitis of left lower limb: Secondary | ICD-10-CM | POA: Diagnosis not present

## 2021-10-08 DIAGNOSIS — L03115 Cellulitis of right lower limb: Secondary | ICD-10-CM | POA: Diagnosis not present

## 2021-10-08 DIAGNOSIS — Z8673 Personal history of transient ischemic attack (TIA), and cerebral infarction without residual deficits: Secondary | ICD-10-CM | POA: Diagnosis not present

## 2021-10-08 LAB — CULTURE, BLOOD (ROUTINE X 2)
Culture: NO GROWTH
Culture: NO GROWTH
Special Requests: ADEQUATE

## 2021-10-08 LAB — HEMOGLOBIN A1C
Hgb A1c MFr Bld: 6.6 % — ABNORMAL HIGH (ref 4.8–5.6)
Mean Plasma Glucose: 143 mg/dL

## 2021-10-08 LAB — GLUCOSE, CAPILLARY
Glucose-Capillary: 116 mg/dL — ABNORMAL HIGH (ref 70–99)
Glucose-Capillary: 121 mg/dL — ABNORMAL HIGH (ref 70–99)
Glucose-Capillary: 128 mg/dL — ABNORMAL HIGH (ref 70–99)
Glucose-Capillary: 144 mg/dL — ABNORMAL HIGH (ref 70–99)
Glucose-Capillary: 164 mg/dL — ABNORMAL HIGH (ref 70–99)
Glucose-Capillary: 179 mg/dL — ABNORMAL HIGH (ref 70–99)

## 2021-10-08 MED ORDER — DIPHENHYDRAMINE HCL 50 MG/ML IJ SOLN
25.0000 mg | Freq: Four times a day (QID) | INTRAMUSCULAR | Status: DC | PRN
  Administered 2021-10-08 – 2021-10-09 (×4): 25 mg via INTRAVENOUS
  Filled 2021-10-08 (×4): qty 1

## 2021-10-08 MED ORDER — HYDROCERIN EX CREA
TOPICAL_CREAM | Freq: Two times a day (BID) | CUTANEOUS | Status: DC
  Filled 2021-10-08: qty 113

## 2021-10-08 MED ORDER — ACETAMINOPHEN 325 MG PO TABS
650.0000 mg | ORAL_TABLET | Freq: Four times a day (QID) | ORAL | Status: DC | PRN
  Administered 2021-10-08 – 2021-10-09 (×4): 650 mg via ORAL
  Filled 2021-10-08 (×4): qty 2

## 2021-10-08 MED ORDER — ACETAMINOPHEN 650 MG RE SUPP: 650.0000 mg | Freq: Four times a day (QID) | RECTAL | Status: DC | PRN

## 2021-10-08 NOTE — Progress Notes (Signed)
PROGRESS NOTE    Sharon [name]  NLG:[id number] DOB: 09-09-1946 DOA: 10/03/2021 PCP: [name], NP    Brief Narrative:  Sharon [name] is a 75 y.o. female seen in ed with complaints of left leg redness and swelling. Pt reports it has been going on since two weeks  12/25 feels left lower extremity redness is improving, has been elevating her legs.   12/26 slow improvement. Keeps legs elevated. 12/27 stopped abx on 12/26 in case having allergy reaction. Told pt to stop her cream she uses all over her body. Oxycodone d/cd today too.   Consultants:  ID  Procedures:   Antimicrobials:  Vanco and cefepime ... Dc'd Cefazolin IV... DC'd 12/26   Subjective: Was her leg swelling is slowly improving, less blush red.  No shortness of breath, dizziness or chest pain  Objective: Vitals:   10/07/21 1524 10/07/21 2208 10/08/21 0426 10/08/21 0733  BP: 131/75 (!) 148/68 130/66 (!) 115/51  Pulse: 88 85 89 75  Resp: 16 16 16 16   Temp: 97.7 F (36.5 C) 98 F (36.7 C) 98 F (36.7 C) 98.6 F (37 C)  TempSrc: Oral   Oral  SpO2: 100% 93% 98% 98%  Weight:      Height:        Intake/Output Summary (Last 24 hours) at 10/08/2021 1312 Last data filed at 10/08/2021 1000 Gross per 24 hour  Intake 600 ml  Output 0 ml  Net 600 ml   Filed Weights   10/03/21 1421  Weight: 96.6 kg    Examination: Calm, NAD CTA no wheeze Regular S1-S2 no gallops Soft benign positive bowel sounds Decrease edema b/l mildly .  Less intense red. Mild redness on arms . aaxox3    Data Reviewed: I have personally reviewed following labs and imaging studies  CBC: Recent Labs  Lab 10/03/21 1422 10/04/21 1026  WBC 6.9 6.9  NEUTROABS 4.4  --   HGB 14.6 13.5  HCT 44.1 41.0  MCV 89.3 88.7  PLT 227 081   Basic Metabolic Panel: Recent Labs  Lab 10/03/21 1422 10/04/21 1026 10/05/21 0342  NA 138 137  --   K 4.1 4.5  --   CL 105 104  --   CO2 26 27  --   GLUCOSE 145* 123*  --   BUN 16 16   --   CREATININE 0.93 0.91 0.87  CALCIUM 9.0 9.0  --    GFR: Estimated Creatinine Clearance: 69.2 mL/min (by C-G formula based on SCr of 0.87 mg/dL). Liver Function Tests: Recent Labs  Lab 10/03/21 1422 10/04/21 1026  AST 22 22  ALT 18 17  ALKPHOS 70 62  BILITOT 0.8 0.7  PROT 6.8 6.5  ALBUMIN 3.8 3.6   No results for input(s): LIPASE, AMYLASE in the last 168 hours. No results for input(s): AMMONIA in the last 168 hours. Coagulation Profile: No results for input(s): INR, PROTIME in the last 168 hours. Cardiac Enzymes: No results for input(s): CKTOTAL, CKMB, CKMBINDEX, TROPONINI in the last 168 hours. BNP (last 3 results) No results for input(s): PROBNP in the last 8760 hours. HbA1C: No results for input(s): HGBA1C in the last 72 hours. CBG: Recent Labs  Lab 10/07/21 2157 10/08/21 0005 10/08/21 0422 10/08/21 0736 10/08/21 1143  GLUCAP 172* 179* 128* 116* 121*   Lipid Profile: No results for input(s): CHOL, HDL, LDLCALC, TRIG, CHOLHDL, LDLDIRECT in the last 72 hours. Thyroid Function Tests: No results for input(s): TSH, T4TOTAL, FREET4, T3FREE, THYROIDAB in  the last 72 hours. Anemia Panel: No results for input(s): VITAMINB12, FOLATE, FERRITIN, TIBC, IRON, RETICCTPCT in the last 72 hours. Sepsis Labs: Recent Labs  Lab 10/03/21 1422 10/04/21 1026 10/05/21 0910  LATICACIDVEN 1.2 2.5* 1.4    Recent Results (from the past 240 hour(s))  Resp Panel by RT-PCR (Flu A&B, Covid) Nasopharyngeal Swab     Status: None   Collection Time: 10/03/21  8:52 PM   Specimen: Nasopharyngeal Swab; Nasopharyngeal(NP) swabs in vial transport medium  Result Value Ref Range Status   SARS Coronavirus 2 by RT PCR NEGATIVE NEGATIVE Final    Comment: (NOTE) SARS-CoV-2 target nucleic acids are NOT DETECTED.  The SARS-CoV-2 RNA is generally detectable in upper respiratory specimens during the acute phase of infection. The lowest concentration of SARS-CoV-2 viral copies this assay can detect  is 138 copies/mL. A negative result does not preclude SARS-Cov-2 infection and should not be used as the sole basis for treatment or other patient management decisions. A negative result may occur with  improper specimen collection/handling, submission of specimen other than nasopharyngeal swab, presence of viral mutation(s) within the areas targeted by this assay, and inadequate number of viral copies(<138 copies/mL). A negative result must be combined with clinical observations, patient history, and epidemiological information. The expected result is Negative.  Fact Sheet for Patients:  EntrepreneurPulse.com.au  Fact Sheet for Healthcare Providers:  IncredibleEmployment.be  This test is no t yet approved or cleared by the [place] FDA and  has been authorized for detection and/or diagnosis of SARS-CoV-2 by FDA under an Emergency Use Authorization (EUA). This EUA will remain  in effect (meaning this test can be used) for the duration of the COVID-19 declaration under Section 564(b)(1) of the Act, 21 U.S.C.section 360bbb-3(b)(1), unless the authorization is terminated  or revoked sooner.       Influenza A by PCR NEGATIVE NEGATIVE Final   Influenza B by PCR NEGATIVE NEGATIVE Final    Comment: (NOTE) The Xpert Xpress SARS-CoV-2/FLU/RSV plus assay is intended as an aid in the diagnosis of influenza from Nasopharyngeal swab specimens and should not be used as a sole basis for treatment. Nasal washings and aspirates are unacceptable for Xpert Xpress SARS-CoV-2/FLU/RSV testing.  Fact Sheet for Patients: EntrepreneurPulse.com.au  Fact Sheet for Healthcare Providers: IncredibleEmployment.be  This test is not yet approved or cleared by the [place] FDA and has been authorized for detection and/or diagnosis of SARS-CoV-2 by FDA under an Emergency Use Authorization (EUA). This EUA will remain in effect  (meaning this test can be used) for the duration of the COVID-19 declaration under Section 564(b)(1) of the Act, 21 U.S.C. section 360bbb-3(b)(1), unless the authorization is terminated or revoked.  Performed at [facility], [address] [place], [place] [address]   Culture, blood (routine x 2)     Status: None   Collection Time: 10/03/21  9:30 PM   Specimen: BLOOD  Result Value Ref Range Status   Specimen Description BLOOD LEFT ASSIST CONTROL  Final   Special Requests   Final    BOTTLES DRAWN AEROBIC AND ANAEROBIC Blood Culture adequate volume   Culture   Final    NO GROWTH 5 DAYS Performed at [facility], [address], [place], [place] [address]    Report Status 10/08/2021 FINAL  Final  Culture, blood (routine x 2)     Status: None   Collection Time: 10/03/21  9:30 PM   Specimen: BLOOD  Result Value Ref Range Status   Specimen  Description BLOOD RIGHT FOREARM  Final   Special Requests   Final    BOTTLES DRAWN AEROBIC AND ANAEROBIC Blood Culture results may not be optimal due to an inadequate volume of blood received in culture bottles   Culture   Final    NO GROWTH 5 DAYS Performed at [facility], [address], [place], [place] [address]    Report Status 10/08/2021 FINAL  Final         Radiology Studies: No results found.      Scheduled Meds:  citalopram  20 mg Oral Daily   clopidogrel  75 mg Oral Daily   docusate sodium  100 mg Oral BID   enoxaparin (LOVENOX) injection  0.5 mg/kg Subcutaneous Q24H   hydrocerin   Topical BID   insulin aspart  0-15 Units Subcutaneous Q4H   methylPREDNISolone (SOLU-MEDROL) injection  40 mg Intravenous Daily   pantoprazole  40 mg Oral Daily   sodium chloride flush  3 mL Intravenous Q12H   Tiotropium Bromide-Olodaterol  2 puff Inhalation Daily   Continuous Infusions:    Assessment & Plan:   Principal Problem:   Cellulitis Active Problems:   CAD (coronary artery disease)   History  of CVA (cerebrovascular accident)   Type 2 diabetes mellitus with obesity ([place])   Hyperlipidemia associated with type 2 diabetes mellitus ([place])   Cellulitis of left leg   Cellulitis of left leg/possible contact dermatitis Had venous Doppler that was negative for DVT on 12/13 Patient had reported itching which raises concern for early contact dermatitis This is not MRSA infection, nonpurulent cellulitis.   lactic acid resolved  12/27 slow improvement. Started on steroids for contact dermatitis and benadryl  Stop oxycodone and the cream she uses as we do not know if having allergies to these Antibiotics were discontinued per IDs recommendation to avoid any type of allergen to Continue leg elevation PT consulted      CAD: Continue Plavix      H/O CVA: Continue Plavix 12/27 BG stable    DM II; Hold p.o. meds  A1c 6.211/22  12/26 BG stable Continue R-ISS     DVT prophylaxis: Lovenox Code Status: Full  Family Communication: None at bedside Disposition Plan:  Status is: Inpatient  Remains inpatient appropriate because: IV treatment.             LOS: 5 days   Time spent: 35 minutes with more than 50% on [place], MD [facility] Pager [phone]  If 7PM-7AM, please contact night-coverage 10/08/2021, 1:12 PM

## 2021-10-08 NOTE — Telephone Encounter (Signed)
Pt is calling to report that she had to cancel her appt for Thursday she is in the hospital.

## 2021-10-08 NOTE — Evaluation (Addendum)
Physical Therapy Evaluation Patient Details Name: Sharon [name] MRN: [id number] DOB: December 20, 1945 Today's Date: 10/08/2021  History of Present Illness  75 y.o. female seen in ed with complaints of left leg redness and swelling. PMH significant for CAD, history of CVA, type 2 diabetes mellitus and hyperlipidemia.  Clinical Impression  Pt received sitting EOB with CNA in room, pt agreeable to PT. She lives with her husband who is currently undergoing dialysis treatments. Her husband is physically able to assist her, helps with IADLs and navigating steps outside. Pt does have ramp that could be used and is agreeable to using it rather than stairs at d/c.   STS and ambulation were performed with SUP, ambulation with RW. Ambulation distance was limited to 122ft by fatigue and LLE pain. Pt typically ambulates household distances. Pt states she has had a harder time getting in and out of bed than she typically does at home. PT would like to assess bed mobility in next session. LLE strength was not assessed due to pain with light touch and deep pressure. Pt is near her baseline and is appropriate to d/c home when medically stable. Would benefit from skilled PT in the acute setting to address above deficits and promote optimal return to PLOF.      Recommendations for follow up therapy are one component of a multi-disciplinary discharge planning process, led by the attending physician.  Recommendations may be updated based on patient status, additional functional criteria and insurance authorization.  Follow Up Recommendations No PT follow up    Assistance Recommended at Discharge Intermittent Supervision/Assistance  Functional Status Assessment Patient has had a recent decline in their functional status and demonstrates the ability to make significant improvements in function in a reasonable and predictable amount of time.  Equipment Recommendations  None recommended by PT    Recommendations for Other  Services       Precautions / Restrictions Precautions Precautions: Fall Restrictions Weight Bearing Restrictions: No      Mobility  Bed Mobility               General bed mobility comments: not assessed - pt sitting EOB at beginning and in recliner at end    Transfers Overall transfer level: Needs assistance Equipment used: None Transfers: Sit to/from Stand Sit to Stand: Supervision           General transfer comment: SUP for safety    Ambulation/Gait Ambulation/Gait assistance: Supervision Gait Distance (Feet): 160 Feet Assistive device: Rolling walker (2 wheels) Gait Pattern/deviations: Step-through pattern;Decreased stance time - left;Decreased weight shift to left;Trunk flexed;Antalgic       General Gait Details: Antalgic gait pattern due to pain in LLE. Reliance on RW for UE support during LLE stance phase. Fatigue midway with PT encouraging pt to turn around.  Stairs            Wheelchair Mobility    Modified Rankin (Stroke Patients Only)       Balance Overall balance assessment: Needs assistance Sitting-balance support: No upper extremity supported;Feet supported Sitting balance-Leahy Scale: Normal     Standing balance support: Bilateral upper extremity supported Standing balance-Leahy Scale: Fair Standing balance comment: No UE support in static standing, BUE support during ambualtion - reliant on RW during L stance phase                             Pertinent Vitals/Pain Pain Assessment: Faces Faces Pain Scale: Hurts little more  Pain Location: LLE during ambulation Pain Intervention(s): Limited activity within patient's tolerance;Repositioned;Monitored during session    [place] expects to be discharged to:: Private residence Living Arrangements: Spouse/significant other Available Help at Discharge: Family;Available PRN/intermittently Type of Home: House Home Access: Stairs to enter;Ramped  entrance Entrance Stairs-Rails: Can reach both Entrance Stairs-Number of Steps: 3 + 7 Alternate Level Stairs-Number of Steps: does not go to basement Home Layout: Two level;Laundry or work area in basement;Able to live on main level with bedroom/bathroom Home Equipment: Conservation officer, nature (2 wheels);Cane - single point Additional Comments: uses RW regularly; husband is undergoing dialysis and is out of the house for 5 hours at a time during these sessions    Prior Function Prior Level of Function : Needs assist       Physical Assist : Mobility (physical);ADLs (physical) Mobility (physical): Stairs;Transfers ADLs (physical): Bathing;IADLs Mobility Comments: transferring into and out of shower; ascending stairs with husband holding onto her - pt descends by bumping down on her bottom (chooses not to use ramp due to preference) ADLs Comments: husband present during showers however pt performs on her own; husband performs grocery shopping, driving, laundry (in basement). Pt helps with light cooking and cleaning.     Hand Dominance        Extremity/Trunk Assessment   Upper Extremity Assessment Upper Extremity Assessment: Overall WFL for tasks assessed    Lower Extremity Assessment Lower Extremity Assessment: Generalized weakness;Overall WFL for tasks assessed;LLE deficits/detail (RIGHT 4/5 hip flexors, 5/5 knee extensors and DF.) LLE Deficits / Details: NT due to pain; mild redness, swelling and warmth in LLE (lower leg more than thigh) LLE: Unable to fully assess due to pain       Communication   Communication: No difficulties  Cognition Arousal/Alertness: Awake/alert Behavior During Therapy: WFL for tasks assessed/performed Overall Cognitive Status: Within Functional Limits for tasks assessed                                          General Comments      Exercises     Assessment/Plan    PT Assessment Patient needs continued PT services  PT Problem List  Decreased strength;Decreased mobility;Decreased activity tolerance;Decreased range of motion;Decreased balance       PT Treatment Interventions Gait training;Therapeutic activities;Therapeutic exercise;Balance training;Stair training;Functional mobility training;Patient/family education    PT Goals (Current goals can be found in the Care Plan section)  Acute Rehab PT Goals Patient Stated Goal: to go home PT Goal Formulation: With patient Time For Goal Achievement: 10/22/21 Potential to Achieve Goals: Good    Frequency Min 2X/week   Barriers to discharge        Co-evaluation               AM-PAC PT "6 Clicks" Mobility  Outcome Measure Help needed turning from your back to your side while in a flat bed without using bedrails?: None Help needed moving from lying on your back to sitting on the side of a flat bed without using bedrails?: A Little Help needed moving to and from a bed to a chair (including a wheelchair)?: A Little Help needed standing up from a chair using your arms (e.g., wheelchair or bedside chair)?: A Little Help needed to walk in hospital room?: A Little Help needed climbing 3-5 steps with a railing? : A Lot 6 Click Score: 18    End  of Session Equipment Utilized During Treatment: Gait belt Activity Tolerance: Patient tolerated treatment well;Patient limited by fatigue Patient left: in chair;with call bell/phone within reach Nurse Communication: Mobility status PT Visit Diagnosis: Unsteadiness on feet (R26.81);Other abnormalities of gait and mobility (R26.89);Muscle weakness (generalized) (M62.81)    Time: 0459-1368 PT Time Calculation (min) (ACUTE ONLY): 33 min   Charges:   PT Evaluation $PT Eval Moderate Complexity: 1 Mod PT Treatments $Gait Training: 8-22 mins $Therapeutic Activity: 8-22 mins        Patrina Levering PT, DPT 10/08/21 3:13 PM [phone]

## 2021-10-09 DIAGNOSIS — Z8673 Personal history of transient ischemic attack (TIA), and cerebral infarction without residual deficits: Secondary | ICD-10-CM | POA: Diagnosis not present

## 2021-10-09 DIAGNOSIS — L03116 Cellulitis of left lower limb: Secondary | ICD-10-CM | POA: Diagnosis not present

## 2021-10-09 LAB — GLUCOSE, CAPILLARY
Glucose-Capillary: 110 mg/dL — ABNORMAL HIGH (ref 70–99)
Glucose-Capillary: 111 mg/dL — ABNORMAL HIGH (ref 70–99)
Glucose-Capillary: 98 mg/dL (ref 70–99)
Glucose-Capillary: 98 mg/dL (ref 70–99)

## 2021-10-09 MED ORDER — PREDNISONE 10 MG PO TABS: ORAL_TABLET | ORAL | 0 refills | Status: AC

## 2021-10-09 NOTE — Progress Notes (Signed)
ID Pt is doing much better Erythema legs better Itching better  Patient Vitals for the past 24 hrs:  BP Temp Temp src Pulse Resp SpO2  10/08/21 2013 (!) 144/71 98.4 F (36.9 C) Oral 83 16 96 %  10/08/21 1545 (!) 104/56 98.8 F (37.1 C) -- 95 18 95 %  10/08/21 1345 122/70 -- -- 97 -- --  10/08/21 0733 (!) 115/51 98.6 F (37 C) Oral 75 16 98 %  10/08/21 0426 130/66 98 F (36.7 C) -- 89 16 98 %    Awake and alert CTA Hss1s2 Abd soft Legs Edema left leg But erythema much improved      Labs;  CBC Latest Ref Rng & Units 10/04/2021 10/03/2021 09/10/2021  WBC 4.0 - 10.5 K/uL 6.9 6.9 8.0  Hemoglobin 12.0 - 15.0 g/dL 13.5 14.6 13.8  Hematocrit 36.0 - 46.0 % 41.0 44.1 41.2  Platelets 150 - 400 K/uL 210 227 295     CMP Latest Ref Rng & Units 10/05/2021 10/04/2021 10/03/2021  Glucose 70 - 99 mg/dL - 123(H) 145(H)  BUN 8 - 23 mg/dL - 16 16  Creatinine 0.44 - 1.00 mg/dL 0.87 0.91 0.93  Sodium 135 - 145 mmol/L - 137 138  Potassium 3.5 - 5.1 mmol/L - 4.5 4.1  Chloride 98 - 111 mmol/L - 104 105  CO2 22 - 32 mmol/L - 27 26  Calcium 8.9 - 10.3 mg/dL - 9.0 9.0  Total Protein 6.5 - 8.1 g/dL - 6.5 6.8  Total Bilirubin 0.3 - 1.2 mg/dL - 0.7 0.8  Alkaline Phos 38 - 126 U/L - 62 70  AST 15 - 41 U/L - 22 22  ALT 0 - 44 U/L - 17 18     Impression/recommendation ?pt with venous edema/stasis presents with erythema of the left leg Pt has been using unna wraps, also has applied elidel before At first blush it looked like cellulitis but with no fever or leucocytosis and no significant response to cefazolin doubt it is so. Also she had  itching which raised concern for  early contact dermatitis  Also has some vague erythematous areas over arms--allergic reation to antibiotic VS topical cream No purulent cellulitis  DC vanco and cefepime on 12/23 Cefazolin 12/23>12/26 and discontinued She received steroids on 12/26 which seemed to have made the difference  Keep leg elevated Pt is stable    Discussed with patient and hospitlaist  ID will sign off- call if needed

## 2021-10-09 NOTE — Progress Notes (Signed)
Discharge instruction and med details reviewed with patient. All questions answered. Patient verbalized understanding. Printed AVS given to patient. IV removed.  Patient escorted out via wheelchair.   [place]

## 2021-10-09 NOTE — Discharge Summary (Signed)
Physician Discharge Summary  Sharon [name] NFA:[id number] DOB: 1946/03/07 DOA: 10/03/2021  PCP: [name], NP  Admit date: 10/03/2021 Discharge date: 10/09/2021  Admitted From: Home Disposition:  Home  Recommendations for Outpatient Follow-up:  Follow up with PCP in 1-2 weeks  Discharge Condition:Stable CODE STATUS:Full Diet recommendation: Diabetic   Brief/Interim Summary: 75 y.o. female seen in ed with complaints of left leg redness and swelling. Pt reports it has been going on since two weeks  Discharge Diagnoses:  Principal Problem:   Cellulitis Active Problems:   CAD (coronary artery disease)   History of CVA (cerebrovascular accident)   Type 2 diabetes mellitus with obesity ([place])   Hyperlipidemia associated with type 2 diabetes mellitus ([place])   Cellulitis of left leg  Cellulitis of left leg/contact dermatitis ruled in Had venous Doppler that was negative for DVT on 12/13 Patient had reported itching which raises concern for early contact dermatitis This is not MRSA infection, nonpurulent cellulitis.   lactic acid resolved  12/27 slow improvement. Started on steroids for contact dermatitis and benadryl with notable improvement Antibiotics were discontinued per IDs recommendation to avoid any type of allergen to Continue leg elevation PT consulted with no needs identified Of note, patient noted to have further outbreak after using skin moisturizer involving areas in contact including arms and thighs. Recommend avoiding further use   CAD: Continue Plavix   H/O CVA: Continue Plavix 12/27 BG stable   DM II; Hold p.o. meds while in hospital A1c 6.211/22  Resume home meds on d/c    Discharge Instructions   Allergies as of 10/09/2021       Reactions   Statins Other (See Comments)   myalgia   Quinolones    FLUOROQUINOLONES - Pt reports she was told to NEVER take these.   Baclofen Swelling   Bactrim [sulfamethoxazole-trimethoprim] Nausea And  Vomiting   Ibuprofen Rash   Mouth swelling   Latex Rash   Some bandaids, some gloves; BLOOD TEST NEGATIVE   Librium [chlordiazepoxide] Itching   Dizziness   Naprosyn [naproxen] Rash   Mouth swelling   Other Rash   [place] nuts - mouth swelling        Medication List     STOP taking these medications    cephALEXin 250 MG capsule Commonly known as: KEFLEX   penicillin v potassium 250 MG tablet Commonly known as: VEETID   tacrolimus 0.1 % ointment Commonly known as: PROTOPIC       TAKE these medications    acetaminophen 500 MG tablet Commonly known as: TYLENOL Take 500 mg by mouth every 6 (six) hours as needed.   Cholecalciferol 1.25 MG (50000 UT) Tabs Take 1 tablet by mouth once a week.   citalopram 20 MG tablet Commonly known as: CELEXA Take 1 tablet (20 mg total) by mouth as needed.   clopidogrel 75 MG tablet Commonly known as: PLAVIX TAKE 1 TABLET(75 MG) BY MOUTH DAILY   Cyanocobalamin 1000 MCG/ML Kit Inject 1,000 mcg as directed every 30 (thirty) days.   furosemide 20 MG tablet Commonly known as: LASIX Take 0.5 tablets (10 mg total) by mouth daily.   loratadine 10 MG tablet Commonly known as: CLARITIN Take 10 mg by mouth daily.   metFORMIN 500 MG tablet Commonly known as: GLUCOPHAGE TAKE 1 TABLET(500 MG) BY MOUTH DAILY WITH BREAKFAST   mometasone 0.1 % cream Commonly known as: ELOCON APPLY TO LOWER LEGS TWICE DAILY UNTIL RASH IMPROVED. AVOID FACE, GROIN, UNDERARMS   onetouch ultrasoft lancets  Utilize to check blood sugar twice a day, fasting in morning with goal < 130 and then 2 hours after a meal with goal <180.  Document and bring to visits.   OneTouch Verio test strip Generic drug: glucose blood Utilize to check blood sugar twice a day, fasting in morning with goal < 130 and then 2 hours after a meal with goal <180.  Document and bring to visits.   OneTouch Verio w/Device Kit Utilize to check blood sugar twice a day, fasting in morning  with goal < 130 and then 2 hours after a meal with goal <180.  Document and bring to visits.   pantoprazole 40 MG tablet Commonly known as: PROTONIX TAKE 1 TABLET(40 MG) BY MOUTH DAILY   pimecrolimus 1 % cream Commonly known as: Elidel Apply topically as directed. Qd to bid aa rash on lower legs   Praluent 150 MG/ML Soaj Generic drug: Alirocumab Inject 1 mL into the skin every 14 (fourteen) days.   predniSONE 10 MG tablet Commonly known as: DELTASONE Take 4 tablets (40 mg total) by mouth daily for 2 days, THEN 2 tablets (20 mg total) daily for 2 days, THEN 1 tablet (10 mg total) daily for 2 days, THEN 0.5 tablets (5 mg total) daily for 2 days. Start taking on: October 09, 2021   ProAir HFA 108 (90 Base) MCG/ACT inhaler Generic drug: albuterol INHALE 2 PUFFS INTO THE LUNGS EVERY 6 HOURS AS NEEDED FOR WHEEZING OR SHORTNESS OF BREATH   Symbicort 160-4.5 MCG/ACT inhaler Generic drug: budesonide-formoterol Inhale into the lungs.   Tiotropium Bromide-Olodaterol 2.5-2.5 MCG/ACT Aers Inhale 2 puffs into the lungs daily.        Follow-up Information     [name], NP. Schedule an appointment as soon as possible for a visit in 2 week(s).   Specialty: Nurse Practitioner Why: Hospital follow up Contact information: [name] [address] [phone]         Minna Merritts, MD .   Specialty: Cardiology Contact information: [place] [address] [phone]         Deboraha Sprang, MD .   Specialty: Cardiology Contact information: [name] [address] [phone]                Allergies  Allergen Reactions   Statins Other (See Comments)    myalgia   Quinolones     FLUOROQUINOLONES - Pt reports she was told to NEVER take these.   Baclofen Swelling   Bactrim [Sulfamethoxazole-Trimethoprim] Nausea And Vomiting   Ibuprofen Rash    Mouth swelling   Latex Rash     Some bandaids, some gloves; BLOOD TEST NEGATIVE   Librium [Chlordiazepoxide] Itching    Dizziness    Naprosyn [Naproxen] Rash    Mouth swelling   Other Rash    [place] nuts - mouth swelling    Consultations: ID  Procedures/Studies: DG Chest 2 View  Result Date: 10/03/2021 CLINICAL DATA:  Left leg infection EXAM: CHEST - 2 VIEW COMPARISON:  06/06/2021 FINDINGS: Hyperinflation. Heart and mediastinal contours are within normal limits. No focal opacities or effusions. No acute bony abnormality. Left chest wall loop recorder device noted. IMPRESSION: Hyperinflation.  No active cardiopulmonary disease. Electronically Signed   By: Rolm Baptise M.D.   On: 10/03/2021 14:57   US Venous Img Lower Unilateral Left (DVT)  Result Date: 09/24/2021 CLINICAL DATA:  Left lower extremity pain and  swelling, redness EXAM: LEFT LOWER EXTREMITY VENOUS DOPPLER ULTRASOUND TECHNIQUE: Gray-scale sonography with graded compression, as well as color Doppler and duplex ultrasound were performed to evaluate the lower extremity deep venous systems from the level of the common femoral vein and including the common femoral, femoral, profunda femoral, popliteal and calf veins including the posterior tibial, peroneal and gastrocnemius veins when visible. The superficial great saphenous vein was also interrogated. Spectral Doppler was utilized to evaluate flow at rest and with distal augmentation maneuvers in the common femoral, femoral and popliteal veins. COMPARISON:  None. FINDINGS: Contralateral Common Femoral Vein: Respiratory phasicity is normal and symmetric with the symptomatic side. No evidence of thrombus. Normal compressibility. Common Femoral Vein: No evidence of thrombus. Normal compressibility, respiratory phasicity and response to augmentation. Saphenofemoral Junction: No evidence of thrombus. Normal compressibility and flow on color Doppler imaging. Profunda Femoral Vein: No evidence of thrombus. Normal  compressibility and flow on color Doppler imaging. Femoral Vein: No evidence of thrombus. Normal compressibility, respiratory phasicity and response to augmentation. Popliteal Vein: No evidence of thrombus. Normal compressibility, respiratory phasicity and response to augmentation. Calf Veins: No evidence of thrombus. Normal compressibility and flow on color Doppler imaging. Other Findings:  Peripheral calf subcutaneous edema. IMPRESSION: No evidence of deep venous thrombosis. Electronically Signed   By: Jerilynn Mages.  Shick M.D.   On: 09/24/2021 16:25   CUP PACEART REMOTE DEVICE CHECK  Result Date: 10/01/2021 ILR summary report received. Battery status OK. Normal device function. No new symptom, tachy, brady, or pause episodes. No new AF episodes. Monthly summary reports and ROV/PRN LR   Subjective: Reports feeling better  Discharge Exam: Vitals:   10/09/21 0410 10/09/21 0817  BP: 121/66 136/66  Pulse: 76 76  Resp: 16 18  Temp: 98 F (36.7 C) 97.7 F (36.5 C)  SpO2: 98% 97%   Vitals:   10/08/21 1545 10/08/21 2013 10/09/21 0410 10/09/21 0817  BP: (!) 104/56 (!) 144/71 121/66 136/66  Pulse: 95 83 76 76  Resp: _0 Temp: 98.8 F (37.1 C) 98.4 F (36.9 C) 98 F (36.7 C) 97.7 F (36.5 C)  TempSrc:  Oral Oral Oral  SpO2: 95% 96% 98% 97%  Weight:      Height:        General: Pt is alert, awake, not in acute distress Cardiovascular: RRR, S1/S2 + Respiratory: CTA bilaterally, no wheezing, no rhonchi Abdominal: Soft, NT, ND, bowel sounds + Extremities: no edema, no cyanosis   The results of significant diagnostics from this hospitalization (including imaging, microbiology, ancillary and laboratory) are listed below for reference.     Microbiology: Recent Results (from the past 240 hour(s))  Resp Panel by RT-PCR (Flu A&B, Covid) Nasopharyngeal Swab     Status: None   Collection Time: 10/03/21  8:52 PM   Specimen: Nasopharyngeal Swab; Nasopharyngeal(NP) swabs in vial transport  medium  Result Value Ref Range Status   SARS Coronavirus 2 by RT PCR NEGATIVE NEGATIVE Final    Comment: (NOTE) SARS-CoV-2 target nucleic acids are NOT DETECTED.  The SARS-CoV-2 RNA is generally detectable in upper respiratory specimens during the acute phase of infection. The lowest concentration of SARS-CoV-2 viral copies this assay can detect is 138 copies/mL. A negative result does not preclude SARS-Cov-2 infection and should not be used as the sole basis for treatment or other patient management decisions. A negative result may occur with  improper specimen collection/handling, submission of specimen other than nasopharyngeal swab, presence of viral mutation(s) within the areas  targeted by this assay, and inadequate number of viral copies(<138 copies/mL). A negative result must be combined with clinical observations, patient history, and epidemiological information. The expected result is Negative.  Fact Sheet for Patients:  EntrepreneurPulse.com.au  Fact Sheet for Healthcare Providers:  IncredibleEmployment.be  This test is no t yet approved or cleared by the [place] FDA and  has been authorized for detection and/or diagnosis of SARS-CoV-2 by FDA under an Emergency Use Authorization (EUA). This EUA will remain  in effect (meaning this test can be used) for the duration of the COVID-19 declaration under Section 564(b)(1) of the Act, 21 U.S.C.section 360bbb-3(b)(1), unless the authorization is terminated  or revoked sooner.       Influenza A by PCR NEGATIVE NEGATIVE Final   Influenza B by PCR NEGATIVE NEGATIVE Final    Comment: (NOTE) The Xpert Xpress SARS-CoV-2/FLU/RSV plus assay is intended as an aid in the diagnosis of influenza from Nasopharyngeal swab specimens and should not be used as a sole basis for treatment. Nasal washings and aspirates are unacceptable for Xpert Xpress SARS-CoV-2/FLU/RSV testing.  Fact Sheet for  Patients: EntrepreneurPulse.com.au  Fact Sheet for Healthcare Providers: IncredibleEmployment.be  This test is not yet approved or cleared by the [place] FDA and has been authorized for detection and/or diagnosis of SARS-CoV-2 by FDA under an Emergency Use Authorization (EUA). This EUA will remain in effect (meaning this test can be used) for the duration of the COVID-19 declaration under Section 564(b)(1) of the Act, 21 U.S.C. section 360bbb-3(b)(1), unless the authorization is terminated or revoked.  Performed at [facility], [address] [place], [place] [address]   Culture, blood (routine x 2)     Status: None   Collection Time: 10/03/21  9:30 PM   Specimen: BLOOD  Result Value Ref Range Status   Specimen Description BLOOD LEFT ASSIST CONTROL  Final   Special Requests   Final    BOTTLES DRAWN AEROBIC AND ANAEROBIC Blood Culture adequate volume   Culture   Final    NO GROWTH 5 DAYS Performed at [facility], [address] [place], [place] [address]    Report Status 10/08/2021 FINAL  Final  Culture, blood (routine x 2)     Status: None   Collection Time: 10/03/21  9:30 PM   Specimen: BLOOD  Result Value Ref Range Status   Specimen Description BLOOD RIGHT FOREARM  Final   Special Requests   Final    BOTTLES DRAWN AEROBIC AND ANAEROBIC Blood Culture results may not be optimal due to an inadequate volume of blood received in culture bottles   Culture   Final    NO GROWTH 5 DAYS Performed at [facility], [address] [place], [place] [address]    Report Status 10/08/2021 FINAL  Final     Labs: BNP (last 3 results) No results for input(s): BNP in the last 8760 hours. Basic Metabolic Panel: Recent Labs  Lab 10/03/21 1422 10/04/21 1026 10/05/21 0342  NA 138 137  --   K 4.1 4.5  --   CL 105 104  --   CO2 26 27  --   GLUCOSE 145* 123*  --   BUN 16 16  --   CREATININE 0.93 0.91 0.87   CALCIUM 9.0 9.0  --    Liver Function Tests: Recent Labs  Lab 10/03/21 1422 10/04/21 1026  AST 22 22  ALT 18 17  ALKPHOS 70 62  BILITOT 0.8 0.7  PROT 6.8  6.5  ALBUMIN 3.8 3.6   No results for input(s): LIPASE, AMYLASE in the last 168 hours. No results for input(s): AMMONIA in the last 168 hours. CBC: Recent Labs  Lab 10/03/21 1422 10/04/21 1026  WBC 6.9 6.9  NEUTROABS 4.4  --   HGB 14.6 13.5  HCT 44.1 41.0  MCV 89.3 88.7  PLT 227 210   Cardiac Enzymes: No results for input(s): CKTOTAL, CKMB, CKMBINDEX, TROPONINI in the last 168 hours. BNP: Invalid input(s): POCBNP CBG: Recent Labs  Lab 10/08/21 2009 10/09/21 0002 10/09/21 0406 10/09/21 0814 10/09/21 1215  GLUCAP 164* 110* 98 98 111*   D-Dimer No results for input(s): DDIMER in the last 72 hours. Hgb A1c No results for input(s): HGBA1C in the last 72 hours. Lipid Profile No results for input(s): CHOL, HDL, LDLCALC, TRIG, CHOLHDL, LDLDIRECT in the last 72 hours. Thyroid function studies No results for input(s): TSH, T4TOTAL, T3FREE, THYROIDAB in the last 72 hours.  Invalid input(s): FREET3 Anemia work up No results for input(s): VITAMINB12, FOLATE, FERRITIN, TIBC, IRON, RETICCTPCT in the last 72 hours. Urinalysis    Component Value Date/Time   COLORURINE YELLOW 10/03/2021 [place] 10/03/2021 1551   APPEARANCEUR Clear 10/17/2016 1421   LABSPEC 1.010 10/03/2021 1551   PHURINE 5.5 10/03/2021 1551   GLUCOSEU NEGATIVE 10/03/2021 1551   HGBUR NEGATIVE 10/03/2021 [place] 10/03/2021 1551   BILIRUBINUR Negative 10/17/2016 1421   KETONESUR NEGATIVE 10/03/2021 1551   PROTEINUR NEGATIVE 10/03/2021 1551   NITRITE NEGATIVE 10/03/2021 1551   LEUKOCYTESUR NEGATIVE 10/03/2021 1551   Sepsis Labs Invalid input(s): PROCALCITONIN,  WBC,  LACTICIDVEN Microbiology Recent Results (from the past 240 hour(s))  Resp Panel by RT-PCR (Flu A&B, Covid) Nasopharyngeal Swab     Status:  None   Collection Time: 10/03/21  8:52 PM   Specimen: Nasopharyngeal Swab; Nasopharyngeal(NP) swabs in vial transport medium  Result Value Ref Range Status   SARS Coronavirus 2 by RT PCR NEGATIVE NEGATIVE Final    Comment: (NOTE) SARS-CoV-2 target nucleic acids are NOT DETECTED.  The SARS-CoV-2 RNA is generally detectable in upper respiratory specimens during the acute phase of infection. The lowest concentration of SARS-CoV-2 viral copies this assay can detect is 138 copies/mL. A negative result does not preclude SARS-Cov-2 infection and should not be used as the sole basis for treatment or other patient management decisions. A negative result may occur with  improper specimen collection/handling, submission of specimen other than nasopharyngeal swab, presence of viral mutation(s) within the areas targeted by this assay, and inadequate number of viral copies(<138 copies/mL). A negative result must be combined with clinical observations, patient history, and epidemiological information. The expected result is Negative.  Fact Sheet for Patients:  EntrepreneurPulse.com.au  Fact Sheet for Healthcare Providers:  IncredibleEmployment.be  This test is no t yet approved or cleared by the [place] FDA and  has been authorized for detection and/or diagnosis of SARS-CoV-2 by FDA under an Emergency Use Authorization (EUA). This EUA will remain  in effect (meaning this test can be used) for the duration of the COVID-19 declaration under Section 564(b)(1) of the Act, 21 U.S.C.section 360bbb-3(b)(1), unless the authorization is terminated  or revoked sooner.       Influenza A by PCR NEGATIVE NEGATIVE Final   Influenza B by PCR NEGATIVE NEGATIVE Final    Comment: (NOTE) The Xpert Xpress SARS-CoV-2/FLU/RSV plus assay is intended as an aid in the diagnosis of influenza from Nasopharyngeal swab specimens and should  not be used as a sole basis for  treatment. Nasal washings and aspirates are unacceptable for Xpert Xpress SARS-CoV-2/FLU/RSV testing.  Fact Sheet for Patients: EntrepreneurPulse.com.au  Fact Sheet for Healthcare Providers: IncredibleEmployment.be  This test is not yet approved or cleared by the [place] FDA and has been authorized for detection and/or diagnosis of SARS-CoV-2 by FDA under an Emergency Use Authorization (EUA). This EUA will remain in effect (meaning this test can be used) for the duration of the COVID-19 declaration under Section 564(b)(1) of the Act, 21 U.S.C. section 360bbb-3(b)(1), unless the authorization is terminated or revoked.  Performed at [facility], [address] [place], [place] [address]   Culture, blood (routine x 2)     Status: None   Collection Time: 10/03/21  9:30 PM   Specimen: BLOOD  Result Value Ref Range Status   Specimen Description BLOOD LEFT ASSIST CONTROL  Final   Special Requests   Final    BOTTLES DRAWN AEROBIC AND ANAEROBIC Blood Culture adequate volume   Culture   Final    NO GROWTH 5 DAYS Performed at [facility], [address] [place], [place] [address]    Report Status 10/08/2021 FINAL  Final  Culture, blood (routine x 2)     Status: None   Collection Time: 10/03/21  9:30 PM   Specimen: BLOOD  Result Value Ref Range Status   Specimen Description BLOOD RIGHT FOREARM  Final   Special Requests   Final    BOTTLES DRAWN AEROBIC AND ANAEROBIC Blood Culture results may not be optimal due to an inadequate volume of blood received in culture bottles   Culture   Final    NO GROWTH 5 DAYS Performed at [facility], [address] [place], [place] [address]    Report Status 10/08/2021 FINAL  Final   Time spent: 30 min  SIGNED:   Marylu Lund, MD  [facility] 10/09/2021, 12:53 PM  If 7PM-7AM, please contact night-coverage

## 2021-10-09 NOTE — Progress Notes (Signed)
Mobility Specialist - Progress Note   10/09/21 1000  Mobility  Activity Ambulated in hall  Level of Assistance Standby assist, set-up cues, supervision of patient - no hands on  Assistive Device Front wheel walker  Distance Ambulated (ft) 220 ft  Mobility Out of bed for toileting;Ambulated with assistance in hallway  Mobility Response Tolerated well  Mobility performed by Mobility specialist  $Mobility charge 1 Mobility    Pt sitting in recliner upon arrival, utilizing RA. Pt motivated for activity, pain 7/10 at rest. Pt ambulated to bathroom prior to continuation into hallway. No physical assist to stand, independent for peri-care. Pain increased to 9/10 with ambulation. 1 short standing rest break taken. Pt reports a "stinging" feeling in LLE with movement. Also voices pain shooting up to L hip this date. Pt left in chair with all needs in reach. RN notified.    Kathee Delton Mobility Specialist 10/09/21, 10:56 AM

## 2021-10-10 ENCOUNTER — Encounter: Payer: Medicare Other | Admitting: Nurse Practitioner

## 2021-10-10 ENCOUNTER — Telehealth: Payer: Self-pay | Admitting: *Deleted

## 2021-10-10 NOTE — Progress Notes (Signed)
Carelink Summary Report / Loop Recorder 

## 2021-10-10 NOTE — Telephone Encounter (Signed)
Transition Care Management Unsuccessful Follow-up Telephone Call  Date of [facility] and from where[facility]  Attempts:  1st Attempt  Reason for unsuccessful TCM follow-up call:  Unable to leave message

## 2021-10-11 ENCOUNTER — Other Ambulatory Visit: Payer: Self-pay | Admitting: Nurse Practitioner

## 2021-10-11 NOTE — Telephone Encounter (Signed)
Requested Prescriptions  Pending Prescriptions Disp Refills   metFORMIN (GLUCOPHAGE) 500 MG tablet [Pharmacy Med Name: METFORMIN 500MG TABLETS] 90 tablet 0    Sig: TAKE 1 TABLET(500 MG) BY MOUTH DAILY WITH BREAKFAST     Endocrinology:  Diabetes - Biguanides Passed - 10/11/2021 10:15 AM      Passed - Cr in normal range and within 360 days    Creatinine, Ser  Date Value Ref Range Status  10/05/2021 0.87 0.44 - 1.00 mg/dL Final         Passed - HBA1C is between 0 and 7.9 and within 180 days    HB A1C (BAYER DCA - WAIVED)  Date Value Ref Range Status  09/10/2021 6.2 (H) 4.8 - 5.6 % Final    Comment:             Prediabetes: 5.7 - 6.4          Diabetes: >6.4          Glycemic control for adults with diabetes: <7.0    Hgb A1c MFr Bld  Date Value Ref Range Status  10/04/2021 6.6 (H) 4.8 - 5.6 % Final    Comment:    (NOTE)         Prediabetes: 5.7 - 6.4         Diabetes: >6.4         Glycemic control for adults with diabetes: <7.0          Passed - eGFR in normal range and within 360 days    GFR calc Af Amer  Date Value Ref Range Status  07/17/2020 67 >59 mL/min/1.73 Final    Comment:    **[facility] currently reports eGFR in compliance with the current**   recommendations of the [employer]. [facility] will   update reporting as new guidelines are published from the NKF-ASN   Task force.    GFR, Estimated  Date Value Ref Range Status  10/05/2021 >60 >60 mL/min Final    Comment:    (NOTE) Calculated using the CKD-EPI Creatinine Equation (2021) Performed at [facility], [address] [place], [place] [address]    eGFR  Date Value Ref Range Status  09/10/2021 61 >59 mL/min/1.73 Final         Passed - Valid encounter within last 6 months    Recent Outpatient Visits          2 weeks ago Cellulitis of left leg   [facility] [place], Henrine Screws T, NP   2 weeks ago Cellulitis of left leg   [place], [place] T,  NP   1 month ago Type 2 diabetes mellitus with obesity ([place])   [facility] [name], Henrine Screws T, NP   6 months ago Lab test positive for detection of COVID-19 virus   [facility] [place], [place] T, NP   8 months ago Type 2 diabetes mellitus with obesity ([place])   [place], [name], NP      Future Appointments    Next 5 Appointments            In 4 days [name], NP [employer] [place]   In 1 week [place], [name], NP [employer], [place]   In 3 weeks Brendolyn Patty, MD [place]   In 4 weeks [place], Areta Haber, PA-C [facility]   In 2 months Caryl Comes, Revonda Standard, MD [facility], [facility]  Displaying the next 5 appointments. This patient has additional appointments scheduled.

## 2021-10-11 NOTE — Progress Notes (Signed)
Error

## 2021-10-15 ENCOUNTER — Encounter: Payer: Self-pay | Admitting: Nurse Practitioner

## 2021-10-15 ENCOUNTER — Ambulatory Visit (INDEPENDENT_AMBULATORY_CARE_PROVIDER_SITE_OTHER): Payer: Medicare Other | Admitting: Nurse Practitioner

## 2021-10-15 ENCOUNTER — Other Ambulatory Visit: Payer: Self-pay

## 2021-10-15 VITALS — BP 138/80 | HR 74 | Temp 97.7°F | Ht 69.0 in | Wt 211.0 lb

## 2021-10-15 DIAGNOSIS — L03116 Cellulitis of left lower limb: Secondary | ICD-10-CM

## 2021-10-15 DIAGNOSIS — J432 Centrilobular emphysema: Secondary | ICD-10-CM

## 2021-10-15 DIAGNOSIS — D692 Other nonthrombocytopenic purpura: Secondary | ICD-10-CM

## 2021-10-15 DIAGNOSIS — I700 Atherosclerosis of aorta: Secondary | ICD-10-CM | POA: Diagnosis not present

## 2021-10-15 NOTE — Assessment & Plan Note (Addendum)
Improving at this time, less erythema and edema.  ?more allergic reaction due to cream she was using.  Will refer for allergy testing to assess.  At this time avoid irritants and continue Prednisone taper until complete + Claritin daily.  Return in one week.  Recheck CBC and BMP today.

## 2021-10-15 NOTE — Patient Instructions (Signed)
Allergic Rhinitis, Adult Allergic rhinitis is an allergic reaction that affects the mucous membrane inside the nose. The mucous membrane is the tissue that produces mucus. There are two types of allergic rhinitis: Seasonal. This type is also called hay fever and happens only during certain seasons. Perennial. This type can happen at any time of the year. Allergic rhinitis cannot be spread from person to person. This condition can be mild, moderate, or severe. It can develop at any age and may be outgrown. What are the causes? This condition is caused by allergens. These are things that can cause an allergic reaction. Allergens may differ for seasonal allergic rhinitis and perennial allergic rhinitis. Seasonal allergic rhinitis is triggered by pollen. Pollen can come from grasses, trees, and weeds. Perennial allergic rhinitis may be triggered by: Dust mites. Proteins in a pet's urine, saliva, or dander. Dander is dead skin cells from a pet. Smoke, mold, or car fumes. What increases the risk? You are more likely to develop this condition if you have a family history of allergies or other conditions related to allergies, including: Allergic conjunctivitis. This is inflammation of parts of the eyes and eyelids. Asthma. This condition affects the lungs and makes it hard to breathe. Atopic dermatitis or eczema. This is long term (chronic) inflammation of the skin. Food allergies. What are the signs or symptoms? Symptoms of this condition include: Sneezing or coughing. A stuffy nose (nasal congestion), itchy nose, or nasal discharge. Itchy eyes and tearing of the eyes. A feeling of mucus dripping down the back of your throat (postnasal drip). Trouble sleeping. Tiredness or fatigue. Headache. Sore throat. How is this diagnosed? This condition may be diagnosed with your symptoms, medical history, and physical exam. Your health care provider may check for related conditions, such  as: Asthma. Pink eye. This is eye inflammation caused by infection (conjunctivitis). Ear infection. Upper respiratory infection. This is an infection in the nose, throat, or upper airways. You may also have tests to find out which allergens trigger your symptoms. These may include skin tests or blood tests. How is this treated? There is no cure for this condition, but treatment can help control symptoms. Treatment may include: Taking medicines that block allergy symptoms, such as corticosteroids and antihistamines. Medicine may be given as a shot, nasal spray, or pill. Avoiding any allergens. Being exposed again and again to tiny amounts of allergens to help you build a defense against allergens (immunotherapy). This is done if other treatments have not helped. It may include: Allergy shots. These are injected medicines that have small amounts of allergen in them. Sublingual immunotherapy. This involves taking small doses of a medicine with allergen in it under your tongue. If these treatments do not work, your health care provider may prescribe newer, stronger medicines. Follow these instructions at home: Avoiding allergens Find out what you are allergic to and avoid those allergens. These are some things you can do to help avoid allergens: If you have perennial allergies: Replace carpet with wood, tile, or vinyl flooring. Carpet can trap dander and dust. Do not smoke. Do not allow smoking in your home. Change your heating and air conditioning filters at least once a month. If you have seasonal allergies, take these steps during allergy season: Keep windows closed as much as possible. Plan outdoor activities when pollen counts are lowest. Check pollen counts before you plan outdoor activities. When coming indoors, change clothing and shower before sitting on furniture or bedding. If you have a pet in  the house that produces allergens: Keep the pet out of the bedroom. Vacuum, sweep, and  dust regularly. General instructions Take over-the-counter and prescription medicines only as told by your health care provider. Drink enough fluid to keep your urine pale yellow. Keep all follow-up visits as told by your health care provider. This is important. Where to find more information American Academy of Allergy, Asthma & Immunology: www.aaaai.org Contact a health care provider if: You have a fever. You develop a cough that does not go away. You make whistling sounds when you breathe (wheeze). Your symptoms slow you down or stop you from doing your normal activities each day. Get help right away if: You have shortness of breath. This symptom may represent a serious problem that is an emergency. Do not wait to see if the symptom will go away. Get medical help right away. Call your local emergency services (911 in the [place] Do not drive yourself to the hospital. Summary Allergic rhinitis may be managed by taking medicines as directed and avoiding allergens. If you have seasonal allergies, keep windows closed as much as possible during allergy season. Contact your health care provider if you develop a fever or a cough that does not go away. This information is not intended to replace advice given to you by your health care provider. Make sure you discuss any questions you have with your health care provider. Document Revised: 11/18/2019 Document Reviewed: 09/27/2019 Elsevier Patient Education  2022 [employer].

## 2021-10-15 NOTE — Progress Notes (Signed)
BP 138/80    Pulse 74    Temp 97.7 F (36.5 C) (Oral)    Ht 5' 9"  (1.753 m)    Wt 211 lb (95.7 kg)    LMP  (LMP Unknown)    SpO2 98%    BMI 31.16 kg/m    Subjective:    Patient ID: Sharon [name], female    DOB: 1945-12-26, 76 y.o.   MRN: [id number]  HPI: Sharon [name] is a 76 y.o. female  Chief Complaint  Patient presents with   Cellulitis    Hosp. F/U for Cellulitis of left leg.    Cough   Transition of Care Hospital Follow up.  Follow-up for hospitalization from 10/03/21 to 10/09/21 for cellulitis left leg -- she was negative for DVT on 09/24/21.  Was placed on steroids and Benadryl in hospital for possible contact dermatitis (they suspect allergy to her Dermacerin cream that she was using for moisturizer) and abx were discontinued by ID due to concern for allergen.  Was discharged on Prednisone taper.  She is followed by vascular and was treated with Unna wrap prior to hospitalization.  Has underlying lymphedema at baseline.  "Cellulitis of left leg/contact dermatitis ruled in Had venous Doppler that was negative for DVT on 12/13 Patient had reported itching which raises concern for early contact dermatitis This is not MRSA infection, nonpurulent cellulitis.   lactic acid resolved  12/27 slow improvement. Started on steroids for contact dermatitis and benadryl with notable improvement Antibiotics were discontinued per IDs recommendation to avoid any type of allergen to Continue leg elevation PT consulted with no needs identified Of note, patient noted to have further outbreak after using skin moisturizer involving areas in contact including arms and thighs. Recommend avoiding further use   CAD: Continue Plavix   H/O CVA: Continue Plavix 12/27 BG stable   DM II; Hold p.o. meds while in hospital A1c 6.211/22  Resume home meds on d/c"  Hospital/Facility: [facility] D/C Physician: Dr. Wyline Copas D/C Date: 10/09/21  Records Requested: 10/15/21 Records Received: 10/15/21 Records  Reviewed: 10/15/21  Diagnoses on Discharge: Cellulitis  Date of interactive Contact within 48 hours of discharge:  Contact was through: phone  Date of 7 day or 14 day face-to-face visit:    within 7 days  Outpatient Encounter Medications as of 10/15/2021  Medication Sig Note   acetaminophen (TYLENOL) 500 MG tablet Take 500 mg by mouth every 6 (six) hours as needed.    Alirocumab (PRALUENT) 150 MG/ML SOAJ Inject 1 mL into the skin every 14 (fourteen) days. 10/03/2021: Last filled 07/12/21 84 day supply   Blood Glucose Monitoring Suppl (ONETOUCH VERIO) w/Device KIT Utilize to check blood sugar twice a day, fasting in morning with goal < 130 and then 2 hours after a meal with goal <180.  Document and bring to visits.    Cholecalciferol 1.25 MG (50000 UT) TABS Take 1 tablet by mouth once a week. 10/03/2021: Patient takes capsules. Last filled 09/01/21 98 day supply   citalopram (CELEXA) 20 MG tablet Take 1 tablet (20 mg total) by mouth as needed. 10/03/2021: Last filled 09/10/21 90 day supply   clopidogrel (PLAVIX) 75 MG tablet TAKE 1 TABLET(75 MG) BY MOUTH DAILY 10/03/2021: Last filled 09/24/21 90 day supply   Cyanocobalamin 1000 MCG/ML KIT Inject 1,000 mcg as directed every 30 (thirty) days.    furosemide (LASIX) 20 MG tablet Take 0.5 tablets (10 mg total) by mouth daily. 10/03/2021: Last filled 09/24/21 60 day supply  glucose blood (ONETOUCH VERIO) test strip Utilize to check blood sugar twice a day, fasting in morning with goal < 130 and then 2 hours after a meal with goal <180.  Document and bring to visits.    Lancets (ONETOUCH ULTRASOFT) lancets Utilize to check blood sugar twice a day, fasting in morning with goal < 130 and then 2 hours after a meal with goal <180.  Document and bring to visits.    loratadine (CLARITIN) 10 MG tablet Take 10 mg by mouth daily.    metFORMIN (GLUCOPHAGE) 500 MG tablet TAKE 1 TABLET(500 MG) BY MOUTH DAILY WITH BREAKFAST    pantoprazole (PROTONIX) 40 MG tablet TAKE  1 TABLET(40 MG) BY MOUTH DAILY 10/03/2021: Last filled 07/25/21 90 day supply   predniSONE (DELTASONE) 10 MG tablet Take 4 tablets (40 mg total) by mouth daily for 2 days, THEN 2 tablets (20 mg total) daily for 2 days, THEN 1 tablet (10 mg total) daily for 2 days, THEN 0.5 tablets (5 mg total) daily for 2 days.    PROAIR HFA 108 (90 Base) MCG/ACT inhaler INHALE 2 PUFFS INTO THE LUNGS EVERY 6 HOURS AS NEEDED FOR WHEEZING OR SHORTNESS OF BREATH 10/03/2021: Last filled 07/30/21 25 days supply   Tiotropium Bromide-Olodaterol 2.5-2.5 MCG/ACT AERS Inhale 2 puffs into the lungs daily. 10/03/2021: Last filled 09/11/21 90 day supply   [DISCONTINUED] mometasone (ELOCON) 0.1 % cream APPLY TO LOWER LEGS TWICE DAILY UNTIL RASH IMPROVED. AVOID FACE, GROIN, UNDERARMS (Patient not taking: Reported on 10/15/2021)    [DISCONTINUED] pimecrolimus (ELIDEL) 1 % cream Apply topically as directed. Qd to bid aa rash on lower legs (Patient not taking: Reported on 10/15/2021)    [DISCONTINUED] SYMBICORT 160-4.5 MCG/ACT inhaler Inhale into the lungs. (Patient not taking: Reported on 10/15/2021) 10/03/2021: Last filled 09/24/21 30 day supply   No facility-administered encounter medications on file as of 10/15/2021.    Diagnostic Tests Reviewed/Disposition: Reviewed labs on chart from hospital  Consults: ID and wound care  Discharge Instructions: Follow-up with PCP and vascular  Disease/illness Education: Reviewed with patient  Home Health/Community Services Discussions/Referrals: Reviewed with patient  Establishment or re-establishment of referral orders for community resources: None  Discussion with other health care providers: Reviewed recent notes  Assessment and Support of treatment regimen adherence: Reviewed with patient  Appointments Coordinated with: Reviewed with patient  Education for self-management, independent living, and ADLs:  Reviewed with patient  SKIN INFECTION Reports some improvement, not as itchy --  less red.  Has Benadryl at home and is using.  Is taking Tylenol as needed, due to discomfort.  Has never been to allergist.  Is not scheduled to return to vascular at this time. Duration: weeks Location: left leg History of trauma in area: no Pain: yes Quality:  aching Severity: 5/10 Redness: yes Swelling: yes Oozing: no Pus: no Fevers: no Nausea/vomiting: no Status: better Treatments attempted: abx and Benadryl   Tetanus: UTD  COPD Taking Stiolto and Proair.  Currently is on Prednisone taper.  Does take Claritin every day at time.  Does endorse a lot of post nasal drainage.  Goes for annual lung screening with last noting mild emphysema and aortic atherosclerosis. COPD status: stable Satisfied with current treatment?: yes Oxygen use: no Dyspnea frequency: none Cough frequency: present at this time Rescue inhaler frequency:  daily Limitation of activity: no Productive cough: none Last Spirometry: unknown Pneumovax: Up to Date Influenza: Up to Date    Relevant past medical, surgical, family and social history reviewed  and updated as indicated. Interim medical history since our last visit reviewed. Allergies and medications reviewed and updated.  Review of Systems  Constitutional:  Negative for activity change, appetite change, diaphoresis, fatigue and fever.  Respiratory:  Negative for cough, chest tightness, shortness of breath and wheezing.   Cardiovascular:  Positive for leg swelling. Negative for chest pain and palpitations.  Gastrointestinal: Negative.   Neurological: Negative.   Psychiatric/Behavioral: Negative.     Per HPI unless specifically indicated above     Objective:    BP 138/80    Pulse 74    Temp 97.7 F (36.5 C) (Oral)    Ht 5' 9"  (1.753 m)    Wt 211 lb (95.7 kg)    LMP  (LMP Unknown)    SpO2 98%    BMI 31.16 kg/m   Wt Readings from Last 3 Encounters:  10/15/21 211 lb (95.7 kg)  10/03/21 213 lb (96.6 kg)  10/03/21 213 lb (96.6 kg)    Physical  Exam Vitals and nursing note reviewed.  Constitutional:      General: She is awake. She is not in acute distress.    Appearance: She is well-developed and well-groomed. She is not ill-appearing or toxic-appearing.  HENT:     Head: Normocephalic.     Right Ear: Hearing normal.     Left Ear: Hearing normal.  Eyes:     General: Lids are normal.        Right eye: No discharge.        Left eye: No discharge.     Conjunctiva/sclera: Conjunctivae normal.  Cardiovascular:     Rate and Rhythm: Normal rate and regular rhythm.     Heart sounds: No murmur heard.   No gallop.  Pulmonary:     Effort: Pulmonary effort is normal. No accessory muscle usage or respiratory distress.     Breath sounds: Normal breath sounds.  Musculoskeletal:     Cervical back: Normal range of motion.     Right lower leg: 2+ Edema present.     Left lower leg: 2+ Edema present.  Skin:    Findings: Bruising (mild noted to upper extremities.) present.     Comments: Left lower extremity with mild erythema extending from anterior ankle to mid-shin and posterior aspect.  Edema present BLE.  No warmth on palpation to area.  Tenderness to palpation.  Overall erythema improving from previous visit.  Neurological:     Mental Status: She is alert and oriented to person, place, and time.  Psychiatric:        Attention and Perception: Attention normal.        Mood and Affect: Mood normal.        Behavior: Behavior normal. Behavior is cooperative.        Thought Content: Thought content normal.        Judgment: Judgment normal.    Results for orders placed or performed during the hospital encounter of 10/03/21  Resp Panel by RT-PCR (Flu A&B, Covid) Nasopharyngeal Swab   Specimen: Nasopharyngeal Swab; Nasopharyngeal(NP) swabs in vial transport medium  Result Value Ref Range   SARS Coronavirus 2 by RT PCR NEGATIVE NEGATIVE   Influenza A by PCR NEGATIVE NEGATIVE   Influenza B by PCR NEGATIVE NEGATIVE  Culture, blood (routine  x 2)   Specimen: BLOOD  Result Value Ref Range   Specimen Description BLOOD LEFT ASSIST CONTROL    Special Requests      BOTTLES DRAWN AEROBIC AND ANAEROBIC Blood  Culture adequate volume   Culture      NO GROWTH 5 DAYS Performed at [facility], [address] [place], [place] [address]    Report Status 10/08/2021 FINAL   Culture, blood (routine x 2)   Specimen: BLOOD  Result Value Ref Range   Specimen Description BLOOD RIGHT FOREARM    Special Requests      BOTTLES DRAWN AEROBIC AND ANAEROBIC Blood Culture results may not be optimal due to an inadequate volume of blood received in culture bottles   Culture      NO GROWTH 5 DAYS Performed at [facility], [address] [place], [place] [address]    Report Status 10/08/2021 FINAL   Lactic acid, plasma  Result Value Ref Range   Lactic Acid, Venous 1.2 0.5 - 1.9 mmol/L  Comprehensive metabolic panel  Result Value Ref Range   Sodium 138 135 - 145 mmol/L   Potassium 4.1 3.5 - 5.1 mmol/L   Chloride 105 98 - 111 mmol/L   CO2 26 22 - 32 mmol/L   Glucose, Bld 145 (H) 70 - 99 mg/dL   BUN 16 8 - 23 mg/dL   Creatinine, Ser 0.93 0.44 - 1.00 mg/dL   Calcium 9.0 8.9 - 10.3 mg/dL   Total Protein 6.8 6.5 - 8.1 g/dL   Albumin 3.8 3.5 - 5.0 g/dL   AST 22 15 - 41 U/L   ALT 18 0 - 44 U/L   Alkaline Phosphatase 70 38 - 126 U/L   Total Bilirubin 0.8 0.3 - 1.2 mg/dL   GFR, Estimated >60 >60 mL/min   Anion gap 7 5 - 15  CBC with Differential  Result Value Ref Range   WBC 6.9 4.0 - 10.5 K/uL   RBC 4.94 3.87 - 5.11 MIL/uL   Hemoglobin 14.6 12.0 - 15.0 g/dL   HCT 44.1 36.0 - 46.0 %   MCV 89.3 80.0 - 100.0 fL   MCH 29.6 26.0 - 34.0 pg   MCHC 33.1 30.0 - 36.0 g/dL   RDW 13.2 11.5 - 15.5 %   Platelets 227 150 - 400 K/uL   nRBC 0.0 0.0 - 0.2 %   Neutrophils Relative % 65 %   Neutro Abs 4.4 1.7 - 7.7 K/uL   Lymphocytes Relative 22 %   Lymphs Abs 1.5 0.7 - 4.0 K/uL   Monocytes Relative 9 %   Monocytes Absolute 0.7 0.1  - 1.0 K/uL   Eosinophils Relative 4 %   Eosinophils Absolute 0.2 0.0 - 0.5 K/uL   Basophils Relative 0 %   Basophils Absolute 0.0 0.0 - 0.1 K/uL   Immature Granulocytes 0 %   Abs Immature Granulocytes 0.02 0.00 - 0.07 K/uL  Urinalysis, Routine w reflex microscopic  Result Value Ref Range   Color, Urine YELLOW YELLOW   APPearance CLEAR CLEAR   Specific Gravity, Urine 1.010 1.005 - 1.030   pH 5.5 5.0 - 8.0   Glucose, UA NEGATIVE NEGATIVE mg/dL   Hgb urine dipstick NEGATIVE NEGATIVE   Bilirubin Urine NEGATIVE NEGATIVE   Ketones, ur NEGATIVE NEGATIVE mg/dL   Protein, ur NEGATIVE NEGATIVE mg/dL   Nitrite NEGATIVE NEGATIVE   Leukocytes,Ua NEGATIVE NEGATIVE  CBC  Result Value Ref Range   WBC 6.9 4.0 - 10.5 K/uL   RBC 4.62 3.87 - 5.11 MIL/uL   Hemoglobin 13.5 12.0 - 15.0 g/dL   HCT 41.0 36.0 - 46.0 %   MCV 88.7 80.0 - 100.0 fL   MCH 29.2 26.0 -  34.0 pg   MCHC 32.9 30.0 - 36.0 g/dL   RDW 13.3 11.5 - 15.5 %   Platelets 210 150 - 400 K/uL   nRBC 0.0 0.0 - 0.2 %  Comprehensive metabolic panel  Result Value Ref Range   Sodium 137 135 - 145 mmol/L   Potassium 4.5 3.5 - 5.1 mmol/L   Chloride 104 98 - 111 mmol/L   CO2 27 22 - 32 mmol/L   Glucose, Bld 123 (H) 70 - 99 mg/dL   BUN 16 8 - 23 mg/dL   Creatinine, Ser 0.91 0.44 - 1.00 mg/dL   Calcium 9.0 8.9 - 10.3 mg/dL   Total Protein 6.5 6.5 - 8.1 g/dL   Albumin 3.6 3.5 - 5.0 g/dL   AST 22 15 - 41 U/L   ALT 17 0 - 44 U/L   Alkaline Phosphatase 62 38 - 126 U/L   Total Bilirubin 0.7 0.3 - 1.2 mg/dL   GFR, Estimated >60 >60 mL/min   Anion gap 6 5 - 15  Lactic acid, plasma  Result Value Ref Range   Lactic Acid, Venous 2.5 (HH) 0.5 - 1.9 mmol/L  Hemoglobin A1c  Result Value Ref Range   Hgb A1c MFr Bld 6.6 (H) 4.8 - 5.6 %   Mean Plasma Glucose 143 mg/dL  Creatinine, serum  Result Value Ref Range   Creatinine, Ser 0.87 0.44 - 1.00 mg/dL   GFR, Estimated >60 >60 mL/min  Glucose, capillary  Result Value Ref Range   Glucose-Capillary  109 (H) 70 - 99 mg/dL  Glucose, capillary  Result Value Ref Range   Glucose-Capillary 188 (H) 70 - 99 mg/dL   Comment 1 Notify RN   Glucose, capillary  Result Value Ref Range   Glucose-Capillary 84 70 - 99 mg/dL   Comment 1 Notify RN   Glucose, capillary  Result Value Ref Range   Glucose-Capillary 97 70 - 99 mg/dL  Glucose, capillary  Result Value Ref Range   Glucose-Capillary 129 (H) 70 - 99 mg/dL   Comment 1 Notify RN    Comment 2 Document in Chart   Lactic acid, plasma  Result Value Ref Range   Lactic Acid, Venous 1.4 0.5 - 1.9 mmol/L  Glucose, capillary  Result Value Ref Range   Glucose-Capillary 126 (H) 70 - 99 mg/dL   Comment 1 Notify RN    Comment 2 Document in Chart   Glucose, capillary  Result Value Ref Range   Glucose-Capillary 79 70 - 99 mg/dL  Glucose, capillary  Result Value Ref Range   Glucose-Capillary 159 (H) 70 - 99 mg/dL  Glucose, capillary  Result Value Ref Range   Glucose-Capillary 120 (H) 70 - 99 mg/dL  Glucose, capillary  Result Value Ref Range   Glucose-Capillary 111 (H) 70 - 99 mg/dL  Glucose, capillary  Result Value Ref Range   Glucose-Capillary 114 (H) 70 - 99 mg/dL   Comment 1 Notify RN    Comment 2 Document in Chart   Glucose, capillary  Result Value Ref Range   Glucose-Capillary 121 (H) 70 - 99 mg/dL   Comment 1 Notify RN    Comment 2 Document in Chart   Glucose, capillary  Result Value Ref Range   Glucose-Capillary 121 (H) 70 - 99 mg/dL   Comment 1 Notify RN    Comment 2 Document in Chart   Glucose, capillary  Result Value Ref Range   Glucose-Capillary 101 (H) 70 - 99 mg/dL  Glucose, capillary  Result Value Ref Range  Glucose-Capillary 103 (H) 70 - 99 mg/dL  Glucose, capillary  Result Value Ref Range   Glucose-Capillary 101 (H) 70 - 99 mg/dL  Glucose, capillary  Result Value Ref Range   Glucose-Capillary 117 (H) 70 - 99 mg/dL  Glucose, capillary  Result Value Ref Range   Glucose-Capillary 130 (H) 70 - 99 mg/dL  Glucose,  capillary  Result Value Ref Range   Glucose-Capillary 111 (H) 70 - 99 mg/dL  Glucose, capillary  Result Value Ref Range   Glucose-Capillary 172 (H) 70 - 99 mg/dL  Glucose, capillary  Result Value Ref Range   Glucose-Capillary 179 (H) 70 - 99 mg/dL  Glucose, capillary  Result Value Ref Range   Glucose-Capillary 128 (H) 70 - 99 mg/dL  Glucose, capillary  Result Value Ref Range   Glucose-Capillary 116 (H) 70 - 99 mg/dL   Comment 1 Notify RN   Glucose, capillary  Result Value Ref Range   Glucose-Capillary 121 (H) 70 - 99 mg/dL   Comment 1 Notify RN    Comment 2 Document in Chart   Glucose, capillary  Result Value Ref Range   Glucose-Capillary 144 (H) 70 - 99 mg/dL   Comment 1 Notify RN   Glucose, capillary  Result Value Ref Range   Glucose-Capillary 164 (H) 70 - 99 mg/dL   Comment 1 Notify RN   Glucose, capillary  Result Value Ref Range   Glucose-Capillary 110 (H) 70 - 99 mg/dL  Glucose, capillary  Result Value Ref Range   Glucose-Capillary 98 70 - 99 mg/dL   Comment 1 Notify RN   Glucose, capillary  Result Value Ref Range   Glucose-Capillary 98 70 - 99 mg/dL  Glucose, capillary  Result Value Ref Range   Glucose-Capillary 111 (H) 70 - 99 mg/dL   Comment 1 Notify RN       Assessment & Plan:   Problem List Items Addressed This Visit       Cardiovascular and Mediastinum   Aortic atherosclerosis (HCC)    Chronic, ongoing.  Noted on CT scan 04/19/2019.  Continue Praluent for prevention and Plavix.  Monitor closely.      Senile purpura (HCC)    On daily Plavix.  Recommend cleansing skin with gentle cleanser and use of daily lotion.  Monitor for skin breakdown and address if present.        Respiratory   Centrilobular emphysema (HCC) - Primary    Chronic, ongoing.  Continue Stiolto to allow for LAMA/LABA combo (previously took Symbicort), to assist with mucus control -- discussed at length with patient.  Obtain spirometry next visit. Continue to collaborate with CCM  team.  Continue annual lung screening.  Pulmonary referral to further assess ongoing cough, ?related to COPD vs more post nasal drainage -- discussed Singulair, she wishes to hold off at this time.        Relevant Orders   Ambulatory referral to Allergy     Other   Cellulitis of left leg    Improving at this time, less erythema and edema.  ?more allergic reaction due to cream she was using.  Will refer for allergy testing to assess.  At this time avoid irritants and continue Prednisone taper until complete + Claritin daily.  Return in one week.  Recheck CBC and BMP today.      Relevant Orders   CBC with Differential/Platelet   Basic metabolic panel   Ambulatory referral to Pulmonology     Follow up plan: Return in about 9 days (around  10/24/2021) for Leg recheck and B12 shot.

## 2021-10-15 NOTE — Assessment & Plan Note (Signed)
Chronic, ongoing.  Noted on CT scan 04/19/2019.  Continue Praluent for prevention and Plavix.  Monitor closely.

## 2021-10-15 NOTE — Assessment & Plan Note (Signed)
On daily Plavix.  Recommend cleansing skin with gentle cleanser and use of daily lotion.  Monitor for skin breakdown and address if present. 

## 2021-10-15 NOTE — Assessment & Plan Note (Signed)
Chronic, ongoing.  Continue Stiolto to allow for LAMA/LABA combo (previously took Symbicort), to assist with mucus control -- discussed at length with patient.  Obtain spirometry next visit. Continue to collaborate with CCM team.  Continue annual lung screening.  Pulmonary referral to further assess ongoing cough, ?related to COPD vs more post nasal drainage -- discussed Singulair, she wishes to hold off at this time.

## 2021-10-16 LAB — CBC WITH DIFFERENTIAL/PLATELET
Basophils Absolute: 0 10*3/uL (ref 0.0–0.2)
Basos: 0 %
EOS (ABSOLUTE): 0.1 10*3/uL (ref 0.0–0.4)
Eos: 2 %
Hematocrit: 44.1 % (ref 34.0–46.6)
Hemoglobin: 14.9 g/dL (ref 11.1–15.9)
Immature Grans (Abs): 0 10*3/uL (ref 0.0–0.1)
Immature Granulocytes: 1 %
Lymphocytes Absolute: 1.8 10*3/uL (ref 0.7–3.1)
Lymphs: 23 %
MCH: 29.3 pg (ref 26.6–33.0)
MCHC: 33.8 g/dL (ref 31.5–35.7)
MCV: 87 fL (ref 79–97)
Monocytes Absolute: 0.7 10*3/uL (ref 0.1–0.9)
Monocytes: 9 %
Neutrophils Absolute: 5.1 10*3/uL (ref 1.4–7.0)
Neutrophils: 65 %
Platelets: 277 10*3/uL (ref 150–450)
RBC: 5.09 x10E6/uL (ref 3.77–5.28)
RDW: 12.8 % (ref 11.7–15.4)
WBC: 7.8 10*3/uL (ref 3.4–10.8)

## 2021-10-16 LAB — BASIC METABOLIC PANEL
BUN/Creatinine Ratio: 20 (ref 12–28)
BUN: 17 mg/dL (ref 8–27)
CO2: 20 mmol/L (ref 20–29)
Calcium: 9 mg/dL (ref 8.7–10.3)
Chloride: 103 mmol/L (ref 96–106)
Creatinine, Ser: 0.84 mg/dL (ref 0.57–1.00)
Glucose: 89 mg/dL (ref 70–99)
Potassium: 4.3 mmol/L (ref 3.5–5.2)
Sodium: 140 mmol/L (ref 134–144)
eGFR: 72 mL/min/{1.73_m2} (ref >59)

## 2021-10-16 NOTE — Progress Notes (Signed)
Contacted via MyChart   Good morning Sharon [name], your labs have returned and remain stable at this time with no concerns noted.  Any questions? Keep being amazing!!  Thank you for allowing me to participate in your care.  I appreciate you. Kindest regards, Stephen Baruch

## 2021-10-22 ENCOUNTER — Other Ambulatory Visit: Payer: Self-pay

## 2021-10-22 ENCOUNTER — Ambulatory Visit (INDEPENDENT_AMBULATORY_CARE_PROVIDER_SITE_OTHER): Payer: Medicare Other | Admitting: Family Medicine

## 2021-10-22 ENCOUNTER — Ambulatory Visit: Payer: Medicare Other | Admitting: Nurse Practitioner

## 2021-10-22 ENCOUNTER — Encounter: Payer: Self-pay | Admitting: Family Medicine

## 2021-10-22 VITALS — BP 103/56 | HR 125 | Temp 99.9°F | Wt 211.0 lb

## 2021-10-22 DIAGNOSIS — L03116 Cellulitis of left lower limb: Secondary | ICD-10-CM

## 2021-10-22 DIAGNOSIS — E538 Deficiency of other specified B group vitamins: Secondary | ICD-10-CM | POA: Diagnosis not present

## 2021-10-22 MED ORDER — CYANOCOBALAMIN 1000 MCG/ML IJ SOLN
1000.0000 ug | Freq: Once | INTRAMUSCULAR | Status: AC
  Administered 2021-10-22: 1000 ug via INTRAMUSCULAR

## 2021-10-22 MED ORDER — CEFDINIR 300 MG PO CAPS: 300.0000 mg | ORAL_CAPSULE | Freq: Two times a day (BID) | ORAL | 0 refills | Status: DC

## 2021-10-22 NOTE — Assessment & Plan Note (Signed)
Complicated history. Appears to be acutely infected again after injury to her shin 4 days ago. Start omnicef due to previous failures and allergies. Will get her into see her dermatologist tomorrow give ?contact derm vs venous stasis skin changes. Follow up with PCP in 2-3 days. Keep leg elevated. Continue to monitor closely. ?neuropathic component with pain. Difficult to assess given current infection- but something to consider when infection is better.

## 2021-10-22 NOTE — Progress Notes (Signed)
BP (!) 103/56    Pulse (!) 125    Temp 99.9 F (37.7 C) (Oral)    Wt 211 lb (95.7 kg)    LMP  (LMP Unknown)    SpO2 99%    BMI 31.16 kg/m    Subjective:    Patient ID: Sharon [name], female    DOB: 04-08-1946, 76 y.o.   MRN: [id number]  HPI: Sharon [name] is a 76 y.o. female  Chief Complaint  Patient presents with   Cellulitis    Patient states she was hospitalized before Christmas and states every thing they use to treat her leg, she was allergic to and states it made her leg worse. Patient state a spot has come up on her leg before she left the hospital, that she is concerned about. Patient states she hit her leg and it is open on the shin area. Patient states she has not heard anything as far as recommendation to use to help her leg and foot.    SKIN INFECTION- recent injury about 4 days Duration: several weeks Location: L lower leg History of trauma in area: yes Pain: yes Quality: aching and shooting and stabbing (like needles) Severity: severe Redness: yes Swelling: yes Oozing: no Pus: no Fevers: no Nausea/vomiting: no Status: worse Treatments attempted:antibiotics and warm compresses  Tetanus: UTD   Relevant past medical, surgical, family and social history reviewed and updated as indicated. Interim medical history since our last visit reviewed. Allergies and medications reviewed and updated.  Review of Systems  Constitutional: Negative.   Respiratory: Negative.    Cardiovascular: Negative.   Skin:  Positive for color change. Negative for pallor, rash and wound.   Per HPI unless specifically indicated above     Objective:    BP (!) 103/56    Pulse (!) 125    Temp 99.9 F (37.7 C) (Oral)    Wt 211 lb (95.7 kg)    LMP  (LMP Unknown)    SpO2 99%    BMI 31.16 kg/m   Wt Readings from Last 3 Encounters:  10/22/21 211 lb (95.7 kg)  10/15/21 211 lb (95.7 kg)  10/03/21 213 lb (96.6 kg)    Physical Exam Vitals and nursing note reviewed.  Constitutional:       General: She is not in acute distress.    Appearance: Normal appearance. She is not ill-appearing, toxic-appearing or diaphoretic.  HENT:     Head: Normocephalic and atraumatic.     Right Ear: External ear normal.     Left Ear: External ear normal.     Nose: Nose normal.     Mouth/Throat:     Mouth: Mucous membranes are moist.     Pharynx: Oropharynx is clear.  Eyes:     General: No scleral icterus.       Right eye: No discharge.        Left eye: No discharge.     Extraocular Movements: Extraocular movements intact.     Conjunctiva/sclera: Conjunctivae normal.     Pupils: Pupils are equal, round, and reactive to light.  Cardiovascular:     Rate and Rhythm: Normal rate and regular rhythm.     Pulses: Normal pulses.     Heart sounds: Normal heart sounds. No murmur heard.   No friction rub. No gallop.  Pulmonary:     Effort: Pulmonary effort is normal. No respiratory distress.     Breath sounds: Normal breath sounds. No stridor. No wheezing, rhonchi or rales.  Chest:     Chest wall: No tenderness.  Musculoskeletal:        General: Tenderness present. No swelling, deformity or signs of injury. Normal range of motion.     Cervical back: Normal range of motion and neck supple.     Right lower leg: No edema.     Left lower leg: Edema present.  Skin:    General: Skin is warm and dry.     Capillary Refill: Capillary refill takes less than 2 seconds.     Coloration: Skin is not jaundiced or pale.     Findings: Erythema present. No bruising, lesion or rash.     Comments: Heat and redness up to mid-shin on the front and back, very tender to palpation   Neurological:     General: No focal deficit present.     Mental Status: She is alert and oriented to person, place, and time. Mental status is at baseline.  Psychiatric:        Mood and Affect: Mood normal.        Behavior: Behavior normal.        Thought Content: Thought content normal.        Judgment: Judgment normal.    Results  for orders placed or performed in visit on 10/15/21  CBC with Differential/Platelet  Result Value Ref Range   WBC 7.8 3.4 - 10.8 x10E3/uL   RBC 5.09 3.77 - 5.28 x10E6/uL   Hemoglobin 14.9 11.1 - 15.9 g/dL   Hematocrit 44.1 34.0 - 46.6 %   MCV 87 79 - 97 fL   MCH 29.3 26.6 - 33.0 pg   MCHC 33.8 31.5 - 35.7 g/dL   RDW 12.8 11.7 - 15.4 %   Platelets 277 150 - 450 x10E3/uL   Neutrophils 65 Not Estab. %   Lymphs 23 Not Estab. %   Monocytes 9 Not Estab. %   Eos 2 Not Estab. %   Basos 0 Not Estab. %   Neutrophils Absolute 5.1 1.4 - 7.0 x10E3/uL   Lymphocytes Absolute 1.8 0.7 - 3.1 x10E3/uL   Monocytes Absolute 0.7 0.1 - 0.9 x10E3/uL   EOS (ABSOLUTE) 0.1 0.0 - 0.4 x10E3/uL   Basophils Absolute 0.0 0.0 - 0.2 x10E3/uL   Immature Granulocytes 1 Not Estab. %   Immature Grans (Abs) 0.0 0.0 - 0.1 A76O1/LX  Basic metabolic panel  Result Value Ref Range   Glucose 89 70 - 99 mg/dL   BUN 17 8 - 27 mg/dL   Creatinine, Ser 0.84 0.57 - 1.00 mg/dL   eGFR 72 >59 mL/min/1.73   BUN/Creatinine Ratio 20 12 - 28   Sodium 140 134 - 144 mmol/L   Potassium 4.3 3.5 - 5.2 mmol/L   Chloride 103 96 - 106 mmol/L   CO2 20 20 - 29 mmol/L   Calcium 9.0 8.7 - 10.3 mg/dL      Assessment & Plan:   Problem List Items Addressed This Visit       Other   Vitamin B12 deficiency   Cellulitis of left lower extremity - Primary    Complicated history. Appears to be acutely infected again after injury to her shin 4 days ago. Start omnicef due to previous failures and allergies. Will get her into see her dermatologist tomorrow give ?contact derm vs venous stasis skin changes. Follow up with PCP in 2-3 days. Keep leg elevated. Continue to monitor closely. ?neuropathic component with pain. Difficult to assess given current infection- but something to consider when infection  is better.       Relevant Orders   CBC with Differential/Platelet     Follow up plan: Return 1-2 days.   >30 minutes spent in chart review,  setting up follow up appointment and with patient today

## 2021-10-22 NOTE — Patient Instructions (Signed)
Dr Nicole Kindred  10/22/21 Noon

## 2021-10-23 ENCOUNTER — Encounter: Payer: Self-pay | Admitting: Family Medicine

## 2021-10-23 ENCOUNTER — Other Ambulatory Visit: Payer: Self-pay | Admitting: Nurse Practitioner

## 2021-10-23 ENCOUNTER — Encounter: Payer: Self-pay | Admitting: Dermatology

## 2021-10-23 ENCOUNTER — Ambulatory Visit (INDEPENDENT_AMBULATORY_CARE_PROVIDER_SITE_OTHER): Payer: Medicare Other | Admitting: Dermatology

## 2021-10-23 DIAGNOSIS — L97221 Non-pressure chronic ulcer of left calf limited to breakdown of skin: Secondary | ICD-10-CM | POA: Diagnosis not present

## 2021-10-23 DIAGNOSIS — I872 Venous insufficiency (chronic) (peripheral): Secondary | ICD-10-CM | POA: Diagnosis not present

## 2021-10-23 DIAGNOSIS — L97821 Non-pressure chronic ulcer of other part of left lower leg limited to breakdown of skin: Secondary | ICD-10-CM

## 2021-10-23 DIAGNOSIS — I83022 Varicose veins of left lower extremity with ulcer of calf: Secondary | ICD-10-CM | POA: Diagnosis not present

## 2021-10-23 LAB — CBC WITH DIFFERENTIAL/PLATELET
Basophils Absolute: 0 10*3/uL (ref 0.0–0.2)
Basos: 0 %
EOS (ABSOLUTE): 0.1 10*3/uL (ref 0.0–0.4)
Eos: 0 %
Hematocrit: 39.1 % (ref 34.0–46.6)
Hemoglobin: 13.5 g/dL (ref 11.1–15.9)
Immature Grans (Abs): 0 10*3/uL (ref 0.0–0.1)
Immature Granulocytes: 0 %
Lymphocytes Absolute: 1 10*3/uL (ref 0.7–3.1)
Lymphs: 7 %
MCH: 29.9 pg (ref 26.6–33.0)
MCHC: 34.5 g/dL (ref 31.5–35.7)
MCV: 87 fL (ref 79–97)
Monocytes Absolute: 1.2 10*3/uL — ABNORMAL HIGH (ref 0.1–0.9)
Monocytes: 8 %
Neutrophils Absolute: 12.2 10*3/uL — ABNORMAL HIGH (ref 1.4–7.0)
Neutrophils: 85 %
Platelets: 199 10*3/uL (ref 150–450)
RBC: 4.52 x10E6/uL (ref 3.77–5.28)
RDW: 12.6 % (ref 11.7–15.4)
WBC: 14.5 10*3/uL — ABNORMAL HIGH (ref 3.4–10.8)

## 2021-10-23 MED ORDER — DESOXIMETASONE 0.05 % EX CREA: TOPICAL_CREAM | Freq: Two times a day (BID) | CUTANEOUS | 1 refills | Status: DC

## 2021-10-23 MED ORDER — MUPIROCIN 2 % EX OINT: TOPICAL_OINTMENT | CUTANEOUS | 1 refills | Status: DC

## 2021-10-23 NOTE — Patient Instructions (Addendum)
Stasis in the legs causes chronic leg swelling, which may result in itchy or painful rashes, skin discoloration, skin texture changes, and sometimes ulceration.  Recommend daily graduated compression hose/stockings- easiest to put on first thing in morning, remove at bedtime.  Elevate legs as much as possible. Avoid salt/sodium rich foods.  Desoximetasone cream: apply twice daily to leg.  Mupirocin ointment apply twice daily to ulcer on leg, until receive Duoderm patches.   Start Duoderm as directed to lesion.  Start Mupirocin ointment to lesion, cover with Duoderm, change every 2-3 days.  Topical steroids (such as triamcinolone, fluocinolone, fluocinonide, mometasone, clobetasol, halobetasol, betamethasone, hydrocortisone) can cause thinning and lightening of the skin if they are used for too long in the same area. Your physician has selected the right strength medicine for your problem and area affected on the body. Please use your medication only as directed by your physician to prevent side effects.    If You Need Anything After Your Visit  If you have any questions or concerns for your doctor, please call our main line at [phone] and press option 4 to reach your doctor's medical assistant. If no one answers, please leave a voicemail as directed and we will return your call as soon as possible. Messages left after 4 pm will be answered the following business day.   You may also send [place] a message via [place]. We typically respond to MyChart messages within 1-2 business days.  For prescription refills, please ask your pharmacy to contact our office. Our fax number is [phone].  If you have an urgent issue when the clinic is closed that cannot wait until the next business day, you can page your doctor at the number below.    Please note that while we do our best to be available for urgent issues outside of office hours, we are not available 24/7.   If you have an urgent issue and  are unable to reach [place], you may choose to seek medical care at your doctor's office, retail clinic, urgent care center, or emergency room.  If you have a medical emergency, please immediately call 911 or go to the emergency department.  Pager Numbers  - Dr. Nehemiah Massed: [phone]  - Dr. Laurence Ferrari: [phone]  - Dr. Nicole Kindred: [phone]  In the event of inclement weather, please call our main line at [phone] for an update on the status of any delays or closures.  Dermatology Medication Tips: Please keep the boxes that topical medications come in in order to help keep track of the instructions about where and how to use these. Pharmacies typically print the medication instructions only on the boxes and not directly on the medication tubes.   If your medication is too expensive, please contact our office at [phone] option 4 or send [place] a message through [place].   We are unable to tell what your co-pay for medications will be in advance as this is different depending on your insurance coverage. However, we may be able to find a substitute medication at lower cost or fill out paperwork to get insurance to cover a needed medication.   If a prior authorization is required to get your medication covered by your insurance company, please allow [place] 1-2 business days to complete this process.  Drug prices often vary depending on where the prescription is filled and some pharmacies may offer cheaper prices.  The website www.goodrx.com contains coupons for medications through different pharmacies. The prices here do not account for what the  cost may be with help from insurance (it may be cheaper with your insurance), but the website can give you the price if you did not use any insurance.  - You can print the associated coupon and take it with your prescription to the pharmacy.  - You may also stop by our office during regular business hours and pick up a GoodRx coupon card.  - If you need your  prescription sent electronically to a different pharmacy, notify our office through [facility] or by phone at [phone] option 4.     Si Usted Necesita Algo Despus de Su Visita  Tambin puede enviarnos un mensaje a travs de Pharmacist, community. Por lo general respondemos a los mensajes de MyChart en el transcurso de 1 a 2 das hbiles.  Para renovar recetas, por favor pida a su farmacia que se ponga en contacto con nuestra oficina. Harland Dingwall de fax es [place] [phone].  Si tiene un asunto urgente cuando la clnica est cerrada y que no puede esperar hasta el siguiente da hbil, puede llamar/localizar a su doctor(a) al nmero que aparece a continuacin.   Por favor, tenga en cuenta que aunque hacemos todo lo posible para estar disponibles para asuntos urgentes fuera del horario de [place], no estamos disponibles las 24 horas del da, los 7 das de la [place].   Si tiene un problema urgente y no puede comunicarse con nosotros, puede optar por buscar atencin mdica  en el consultorio de su doctor(a), en una clnica privada, en un centro de atencin urgente o en una sala de emergencias.  Si tiene Engineering geologist, por favor llame inmediatamente al 911 o vaya a la sala de emergencias.  Nmeros de bper  - Dr. Nehemiah Massed: [phone]  - Dra. Moye: [phone]  - Dra. Nicole Kindred: [phone]  En caso de inclemencias del [place], por favor llame a Johnsie Kindred principal al [phone] para una actualizacin sobre el [place] de cualquier retraso o cierre.  Consejos para la medicacin en dermatologa: Por favor, guarde las cajas en las que vienen los medicamentos de uso tpico para ayudarle a seguir las instrucciones sobre dnde y cmo usarlos. Las farmacias generalmente imprimen las instrucciones del medicamento slo en las cajas y no directamente en los tubos del [place].   Si su medicamento es muy caro, por favor, pngase en contacto con Zigmund Daniel llamando al [phone]  y presione la opcin 4 o envenos un mensaje a travs de Pharmacist, community.   No podemos decirle cul ser su copago por los medicamentos por adelantado ya que esto es diferente dependiendo de la cobertura de su seguro. Sin embargo, es posible que podamos encontrar un medicamento sustituto a Electrical engineer un formulario para que el seguro cubra el medicamento que se considera necesario.   Si se requiere una autorizacin previa para que su compaa de seguros [place] su medicamento, por favor permtanos de 1 a 2 das hbiles para completar este proceso.  Los precios de los medicamentos varan con frecuencia dependiendo del Environmental consultant de dnde se surte la receta y alguna farmacias pueden ofrecer precios ms baratos.  El sitio web www.goodrx.com tiene cupones para medicamentos de Airline pilot. Los precios aqu no tienen en cuenta lo que podra costar con la ayuda del seguro (puede ser ms barato con su seguro), pero el sitio web puede darle el precio si no utiliz Research scientist (physical sciences).  - Puede imprimir el cupn correspondiente y llevarlo con su receta a la farmacia.  - Tambin puede pasar por  nuestra oficina durante el horario de atencin regular y Charity fundraiser una tarjeta de cupones de GoodRx.  - Si necesita que su receta se enve electrnicamente a una farmacia diferente, informe a nuestra oficina a travs de MyChart de [place] o por telfono llamando al [phone] y presione la opcin 4.

## 2021-10-23 NOTE — Progress Notes (Signed)
° °  Follow-Up Visit   Subjective  Sharon [name] is a 76 y.o. female who presents for the following: follow up (Recheck left leg. She saw [name], NP 09/23/2021 and was given Keflex 500mg  4x/day for 5 days. PCP sent her to vascular/vein doctor. U/S showed no DVT. Leg was wrapped, patient returned in one week, was sent from there to the hospital. Hospitalization from 10/03/21 to 10/09/21 for cellulitis left leg. Was given antibiotics, steroids and Benadryl. Saw PCP yesterday, was started on Cefdinir. Leg is painful, stinging. Has lymphedema pump at home. Has not been using since home from hospital. ). Unable to put on compression hose due to increased swelling.    The following portions of the chart were reviewed this encounter and updated as appropriate:      Review of Systems: No other skin or systemic complaints except as noted in HPI or Assessment and Plan.   Objective  Well appearing patient in no apparent distress; mood and affect are within normal limits.  A focused examination was performed including face, left leg. Relevant physical exam findings are noted in the Assessment and Plan.  Left Lower Leg - Anterior 2+ pitting edema, peau d'orange change of skin with erythema up to below knee  Left pretibial Ulceration at anterior left shin. 0.8 cm crusted superficial ulcer. ~42mm depth. Pt has h/o recent trauma to leg   Assessment & Plan  Venous stasis dermatitis of left lower extremity Left Lower Leg - Anterior  With flare, no evidence of active cellulitis at this time  Stasis in the legs causes chronic leg swelling, which may result in itchy or painful rashes, skin discoloration, skin texture changes, and sometimes ulceration.  Recommend daily graduated compression hose/stockings- easiest to put on first thing in morning, remove at bedtime.  Elevate legs as much as possible. Avoid salt/sodium rich foods.  Resume lymphedema pump 4x/day, 15 minutes.   Start Desoximetasone  0.05% cream twice daily to leg.   Pt may finish the Omnicef antibiotic as prescribed  Patient unable to use Triamcinolone cream due to medication allergy to lanolin.  Juxtalite stocking/wrap ordered today. Patient to bring at next visit for application training.   desoximetasone (TOPICORT) 0.05 % cream - Left Lower Leg - Anterior Apply topically 2 (two) times daily.  Venous stasis ulcer of other part of left lower leg limited to breakdown of skin without varicose veins (HCC) Left pretibial  Bacterial culture performed today  Start Mupirocin ointment to lesion, cover with Duoderm when available, change every 2-3 days. Start Duoderm q2-3 day as directed to lesion. Ordered today, will ship to patient's home.    Will start Juxtalite compression once available.  mupirocin ointment (BACTROBAN) 2 % - Left pretibial Apply twice daily to open sores  Anaerobic and Aerobic Culture - Left pretibial   Return 2 weeks f/up stasis derm.  I, Emelia Salisbury, CMA, am acting as scribe for Brendolyn Patty, MD.  Documentation: I have reviewed the above documentation for accuracy and completeness, and I agree with the above.  Brendolyn Patty MD

## 2021-10-23 NOTE — Telephone Encounter (Signed)
Requested Prescriptions  Pending Prescriptions Disp Refills   pantoprazole (PROTONIX) 40 MG tablet [Pharmacy Med Name: PANTOPRAZOLE 40MG  TABLETS] 90 tablet 0    Sig: TAKE 1 TABLET(40 MG) BY MOUTH DAILY     Gastroenterology: Proton Pump Inhibitors Passed - 10/23/2021  6:22 AM      Passed - Valid encounter within last 12 months    Recent Outpatient Visits          Yesterday Cellulitis of left lower extremity   [place], Megan P, DO   1 week ago Centrilobular emphysema ([place])   [place], Morris T, NP   3 weeks ago Cellulitis of left leg   [facility], Courtenay T, NP   1 month ago Cellulitis of left leg   [place], [place] T, NP   1 month ago Type 2 diabetes mellitus with obesity ([place])   [place], [name], NP      Future Appointments    Next 5 Appointments            Today Brendolyn Patty, MD [place], Barb Merino, DO [place], [place]   In 1 week Brendolyn Patty, MD [place]   In 2 weeks Dunn, Areta Haber, PA-C [facility], [facility]   In 1 month Deboraha Sprang, MD [facility], [place] the next 5 appointments. This patient has additional appointments scheduled.

## 2021-10-24 ENCOUNTER — Ambulatory Visit: Payer: Medicare Other | Admitting: Family Medicine

## 2021-10-24 DIAGNOSIS — I83022 Varicose veins of left lower extremity with ulcer of calf: Secondary | ICD-10-CM | POA: Diagnosis not present

## 2021-10-27 ENCOUNTER — Encounter: Payer: Self-pay | Admitting: Dermatology

## 2021-10-29 ENCOUNTER — Ambulatory Visit: Payer: Medicare Other

## 2021-10-30 ENCOUNTER — Other Ambulatory Visit: Payer: Self-pay

## 2021-10-30 ENCOUNTER — Ambulatory Visit (INDEPENDENT_AMBULATORY_CARE_PROVIDER_SITE_OTHER): Payer: Medicare Other

## 2021-10-30 DIAGNOSIS — I872 Venous insufficiency (chronic) (peripheral): Secondary | ICD-10-CM

## 2021-10-30 NOTE — Progress Notes (Signed)
Patient here today to learn how to apply Juxtalite daily. Patient still has not gotten her topicort cream due to the pharmacy having to order medication. OKed with Dr. Nehemiah Massed today to apply Vaseline to leg before applying stocking and Juxtatlie stocking. Patient understands instructions and felt good about wearing the stockings at 30-40 strength.   Patient will call or send MyChart message for any issues or instructions needed.

## 2021-10-31 ENCOUNTER — Telehealth: Payer: Self-pay

## 2021-10-31 DIAGNOSIS — I872 Venous insufficiency (chronic) (peripheral): Secondary | ICD-10-CM

## 2021-10-31 LAB — ANAEROBIC AND AEROBIC CULTURE

## 2021-10-31 MED ORDER — DESOXIMETASONE 0.05 % EX CREA: TOPICAL_CREAM | Freq: Two times a day (BID) | CUTANEOUS | 1 refills | Status: DC

## 2021-10-31 NOTE — Telephone Encounter (Signed)
Advised pt of culture results.  Pt  advised she still doesn't have her Topicort for her legs.  She wanted me to send it to [place] since [facility] has not been able to get it for her.  I sent to [facility] [place]./sh

## 2021-10-31 NOTE — Telephone Encounter (Signed)
-----   Message from Brendolyn Patty, MD sent at 10/31/2021  2:49 PM EST ----- Bacterial culture is negative - please call patient

## 2021-11-01 ENCOUNTER — Encounter: Payer: Self-pay | Admitting: Nurse Practitioner

## 2021-11-01 ENCOUNTER — Other Ambulatory Visit: Payer: Self-pay | Admitting: Nurse Practitioner

## 2021-11-01 ENCOUNTER — Telehealth: Payer: Self-pay

## 2021-11-01 DIAGNOSIS — E1169 Type 2 diabetes mellitus with other specified complication: Secondary | ICD-10-CM

## 2021-11-01 DIAGNOSIS — G459 Transient cerebral ischemic attack, unspecified: Secondary | ICD-10-CM

## 2021-11-01 DIAGNOSIS — I2510 Atherosclerotic heart disease of native coronary artery without angina pectoris: Secondary | ICD-10-CM

## 2021-11-01 MED ORDER — LORATADINE 10 MG PO TABS: 10.0000 mg | ORAL_TABLET | Freq: Every day | ORAL | 4 refills | Status: DC

## 2021-11-01 MED ORDER — CHOLECALCIFEROL 1.25 MG (50000 UT) PO TABS: 1.0000 | ORAL_TABLET | ORAL | 4 refills | Status: DC

## 2021-11-01 MED ORDER — FUROSEMIDE 20 MG PO TABS: 10.0000 mg | ORAL_TABLET | Freq: Every day | ORAL | 4 refills | Status: DC

## 2021-11-01 MED ORDER — CITALOPRAM HYDROBROMIDE 20 MG PO TABS: 20.0000 mg | ORAL_TABLET | ORAL | 4 refills | Status: DC | PRN

## 2021-11-01 MED ORDER — CLOPIDOGREL BISULFATE 75 MG PO TABS: ORAL_TABLET | ORAL | 4 refills | Status: DC

## 2021-11-01 MED ORDER — METFORMIN HCL 500 MG PO TABS: 500.0000 mg | ORAL_TABLET | Freq: Two times a day (BID) | ORAL | 4 refills | Status: DC

## 2021-11-01 MED ORDER — TIOTROPIUM BROMIDE-OLODATEROL 2.5-2.5 MCG/ACT IN AERS: 2.0000 | INHALATION_SPRAY | Freq: Every day | RESPIRATORY_TRACT | 4 refills | Status: DC

## 2021-11-01 MED ORDER — ALBUTEROL SULFATE HFA 108 (90 BASE) MCG/ACT IN AERS: 2.0000 | INHALATION_SPRAY | Freq: Four times a day (QID) | RESPIRATORY_TRACT | 4 refills | Status: DC | PRN

## 2021-11-01 MED ORDER — PANTOPRAZOLE SODIUM 40 MG PO TBEC: DELAYED_RELEASE_TABLET | ORAL | 4 refills | Status: DC

## 2021-11-01 MED ORDER — PRALUENT 150 MG/ML ~~LOC~~ SOAJ: 1.0000 mL | SUBCUTANEOUS | 4 refills | Status: DC

## 2021-11-01 NOTE — Telephone Encounter (Signed)
Copied from [place] [id number]. Topic: General - Inquiry >> Oct 31, 2021  4:44 PM Loma Boston wrote: Reason for CRM: Pt is insisting that Quadir Muns give her a call. She has wanted to change all her drug list to [facility] and [name] has been reaching out to [name] in [place] and they say that they will not respond. Pt is sick and has no patience. [name] states that she can pick up a form from her dr and the doctor can list everything she was taking. She really doesn't feel like getting the form either.  She does not have the patience for me to go over with her. Was reaching out to NT but she is insistent to just have Jamarie Joplin take care of it all and apologized as she knew she was being hard to get along with. FU with pt at [phone].   Spoke with [name] and was informed that normally they would reach out to the patient's current pharmacy and have them to just transfer all the medications. But, the pharmacist states due to [facility] lack of communication which hasn't been the greatest. She says the PCP would have to write all new scripts and send them over to their facility, as that would be the easiest route currently.

## 2021-11-01 NOTE — Telephone Encounter (Unsigned)
Copied from [place] [id number]. Topic: General - Inquiry >> Oct 31, 2021  4:44 PM Loma [place] wrote: Reason for CRM: Pt is insisting that Sharon [name] give her a call. She has wanted to change all her drug list to [facility] and [name] has been reaching out to [name] in [name] and they say that they will not respond. Pt is sick and has no patience. [name] states that she can pick up a form from her dr and the doctor can list everything she was taking. She really doesn't feel like getting the form either.  She does not have the patience for me to go over with her. Was reaching out to NT but she is insistent to just have [name] take care of it all and apologized as she knew she was being hard to get along with. FU with pt at [phone]

## 2021-11-01 NOTE — Addendum Note (Signed)
Addended by: [name] T on: 11/01/2021 10:06 AM   Modules accepted: Orders

## 2021-11-01 NOTE — Telephone Encounter (Signed)
Patient states she has not felt like herself since she was hospitalized. She states she just feels off and states she is currently 1 tablet of her Metformin prescription and patient is asking if she can take 2 tablets of Metformin. Patient states if she is allowed to, if Jolene could send over a new prescription for the new instructions. Advised patient to give our office if she has any other questions. Per Henrine Screws says she is OK for patient to take 2 tablets instead of 1 tablet, as long as patient's stomach is able to tolerate it. Patient verbalized understanding.

## 2021-11-04 ENCOUNTER — Ambulatory Visit (INDEPENDENT_AMBULATORY_CARE_PROVIDER_SITE_OTHER): Payer: Medicare Other

## 2021-11-04 DIAGNOSIS — Z8673 Personal history of transient ischemic attack (TIA), and cerebral infarction without residual deficits: Secondary | ICD-10-CM | POA: Diagnosis not present

## 2021-11-04 LAB — CUP PACEART REMOTE DEVICE CHECK
Date Time Interrogation Session: 20230122234033
Implantable Pulse Generator Implant Date: 20190328

## 2021-11-05 ENCOUNTER — Ambulatory Visit (INDEPENDENT_AMBULATORY_CARE_PROVIDER_SITE_OTHER): Payer: Medicare Other | Admitting: Dermatology

## 2021-11-05 ENCOUNTER — Other Ambulatory Visit: Payer: Self-pay

## 2021-11-05 DIAGNOSIS — I872 Venous insufficiency (chronic) (peripheral): Secondary | ICD-10-CM

## 2021-11-05 MED ORDER — CEFDINIR 300 MG PO CAPS: 300.0000 mg | ORAL_CAPSULE | Freq: Two times a day (BID) | ORAL | 0 refills | Status: DC

## 2021-11-05 NOTE — Patient Instructions (Addendum)
Recommend CeraVe SA CreamStasis in the legs causes chronic leg swelling, which may result in itchy or painful rashes, skin discoloration, skin texture changes, and sometimes ulceration.  Recommend daily graduated compression hose/stockings- easiest to put on first thing in morning, remove at bedtime. Elevate legs as much as possible. Avoid salt/sodium rich foods.   Continue lymphedema pump 4x/day, 15 minutes.  Continue desoximetasone 0.05% twice daily Continue juxtalite daily Recommend patient start CeraVe SA Cream and CeraVe anti itch cream. Recommend patient check with PCP to see if she is able to increase diuretic.  Start cefdinir 300 mg twice daily x 1 week     If You Need Anything After Your Visit  If you have any questions or concerns for your doctor, please call our main line at [phone] and press option 4 to reach your doctor's medical assistant. If no one answers, please leave a voicemail as directed and we will return your call as soon as possible. Messages left after 4 pm will be answered the following business day.   You may also send [place] a message via [place]. We typically respond to MyChart messages within 1-2 business days.  For prescription refills, please ask your pharmacy to contact our office. Our fax number is [phone].  If you have an urgent issue when the clinic is closed that cannot wait until the next business day, you can page your doctor at the number below.    Please note that while we do our best to be available for urgent issues outside of office hours, we are not available 24/7.   If you have an urgent issue and are unable to reach [place], you may choose to seek medical care at your doctor's office, retail clinic, urgent care center, or emergency room.  If you have a medical emergency, please immediately call 911 or go to the emergency department.  Pager Numbers  - Dr. Nehemiah Massed: [phone]  - Dr. Laurence Ferrari: [phone]  - Dr. Nicole Kindred: [phone]  In  the event of inclement weather, please call our main line at [phone] for an update on the status of any delays or closures.  Dermatology Medication Tips: Please keep the boxes that topical medications come in in order to help keep track of the instructions about where and how to use these. Pharmacies typically print the medication instructions only on the boxes and not directly on the medication tubes.   If your medication is too expensive, please contact our office at [phone] option 4 or send [place] a message through [place].   We are unable to tell what your co-pay for medications will be in advance as this is different depending on your insurance coverage. However, we may be able to find a substitute medication at lower cost or fill out paperwork to get insurance to cover a needed medication.   If a prior authorization is required to get your medication covered by your insurance company, please allow [place] 1-2 business days to complete this process.  Drug prices often vary depending on where the prescription is filled and some pharmacies may offer cheaper prices.  The website www.goodrx.com contains coupons for medications through different pharmacies. The prices here do not account for what the cost may be with help from insurance (it may be cheaper with your insurance), but the website can give you the price if you did not use any insurance.  - You can print the associated coupon and take it with your prescription to the pharmacy.  - You may also  stop by our office during regular business hours and pick up a GoodRx coupon card.  - If you need your prescription sent electronically to a different pharmacy, notify our office through [facility] or by phone at [phone] option 4.     Si Usted Necesita Algo Despus de Su Visita  Tambin puede enviarnos un mensaje a travs de Pharmacist, community. Por lo general respondemos a los mensajes de MyChart en el transcurso de 1 a 2 das  hbiles.  Para renovar recetas, por favor pida a su farmacia que se ponga en contacto con nuestra oficina. Harland Dingwall de fax es [place] [phone].  Si tiene un asunto urgente cuando la clnica est cerrada y que no puede esperar hasta el siguiente da hbil, puede llamar/localizar a su doctor(a) al nmero que aparece a continuacin.   Por favor, tenga en cuenta que aunque hacemos todo lo posible para estar disponibles para asuntos urgentes fuera del horario de [place], no estamos disponibles las 24 horas del da, los 7 das de la [place].   Si tiene un problema urgente y no puede comunicarse con nosotros, puede optar por buscar atencin mdica  en el consultorio de su doctor(a), en una clnica privada, en un centro de atencin urgente o en una sala de emergencias.  Si tiene Engineering geologist, por favor llame inmediatamente al 911 o vaya a la sala de emergencias.  Nmeros de bper  - Dr. Nehemiah Massed: [phone]  - Dra. Moye: [phone]  - Dra. Nicole Kindred: [phone]  En caso de inclemencias del [place], por favor llame a Johnsie Kindred principal al [phone] para una actualizacin sobre el [place] de cualquier retraso o cierre.  Consejos para la medicacin en dermatologa: Por favor, guarde las cajas en las que vienen los medicamentos de uso tpico para ayudarle a seguir las instrucciones sobre dnde y cmo usarlos. Las farmacias generalmente imprimen las instrucciones del medicamento slo en las cajas y no directamente en los tubos del [place].   Si su medicamento es muy caro, por favor, pngase en contacto con Zigmund Daniel llamando al [phone] y presione la opcin 4 o envenos un mensaje a travs de Pharmacist, community.   No podemos decirle cul ser su copago por los medicamentos por adelantado ya que esto es diferente dependiendo de la cobertura de su seguro. Sin embargo, es posible que podamos encontrar un medicamento sustituto a Electrical engineer un formulario para que el  seguro cubra el medicamento que se considera necesario.   Si se requiere una autorizacin previa para que su compaa de seguros [place] su medicamento, por favor permtanos de 1 a 2 das hbiles para completar este proceso.  Los precios de los medicamentos varan con frecuencia dependiendo del Environmental consultant de dnde se surte la receta y alguna farmacias pueden ofrecer precios ms baratos.  El sitio web www.goodrx.com tiene cupones para medicamentos de Airline pilot. Los precios aqu no tienen en cuenta lo que podra costar con la ayuda del seguro (puede ser ms barato con su seguro), pero el sitio web puede darle el precio si no utiliz Research scientist (physical sciences).  - Puede imprimir el cupn correspondiente y llevarlo con su receta a la farmacia.  - Tambin puede pasar por nuestra oficina durante el horario de atencin regular y Charity fundraiser una tarjeta de cupones de GoodRx.  - Si necesita que su receta se enve electrnicamente a Chiropodist, informe a nuestra oficina a travs de MyChart de [place] o por telfono llamando al [phone] y presione la opcin  4. ° °

## 2021-11-05 NOTE — Progress Notes (Signed)
° °  Follow-Up Visit   Subjective  Sharon [name] is a 76 y.o. female who presents for the following: Follow-up (Patient here for 2 week stasis follow up. She has been using desoximetasone twice daily for 5 days and wrapping with juxtalite. Patient advises leg is red, itches, burns and really sore. ).  Not any better.  Patient is scheduled to see PCP 11/14/21. She is using lymphedema pump 4x daily.  Patient's PCP prescribed omnicef 300 mg 2 weeks ago to take twice daily x 1 week that patient thinks that helped quite a bit.   The following portions of the chart were reviewed this encounter and updated as appropriate:       Review of Systems:  No other skin or systemic complaints except as noted in HPI or Assessment and Plan.  Objective  Well appearing patient in no apparent distress; mood and affect are within normal limits.  A focused examination was performed including left leg. Relevant physical exam findings are noted in the Assessment and Plan.  Left Lower Leg - Anterior Bright erythema with peau d'orange textural change, pitting edema  5 mm ulcer at left pretibia healed          Assessment & Plan  Venous stasis dermatitis of left lower extremity Left Lower Leg - Anterior  No improvement  Stasis in the legs causes chronic leg swelling, which may result in itchy or painful rashes, skin discoloration, skin texture changes, and sometimes ulceration.  Recommend daily graduated compression hose/stockings- easiest to put on first thing in morning, remove at bedtime.  Elevate legs as much as possible. Avoid salt/sodium rich foods.   Continue lymphedema pump 4x/day, 15 minutes.  Continue desoximetasone 0.05% cream twice daily Continue juxtalite compression daily Recommend patient start CeraVe SA Cream to legs qd/bid, may also use CeraVe anti-itch cream prn Recommend patient check with PCP to see if she is able to increase oral diuretic.  Start cefdinir 300 mg twice daily x 1 week,  will reassess next week to see if improved. Discussed UNNA boot, but pt feels like this is what initially caused flare    cefdinir (OMNICEF) 300 MG capsule - Left Lower Leg - Anterior Take 1 capsule (300 mg total) by mouth 2 (two) times daily.  Related Medications desoximetasone (TOPICORT) 0.05 % cream Apply topically 2 (two) times daily.   Return in about 1 week (around 11/12/2021) for stasis follow up.  Graciella Belton, RMA, am acting as scribe for Brendolyn Patty, MD .  Documentation: I have reviewed the above documentation for accuracy and completeness, and I agree with the above.  Brendolyn Patty MD

## 2021-11-06 MED ORDER — FUROSEMIDE 20 MG PO TABS: 20.0000 mg | ORAL_TABLET | Freq: Every day | ORAL | 4 refills | Status: DC

## 2021-11-06 NOTE — Progress Notes (Signed)
Cardiology Office Note    Date:  11/08/2021   ID:  Sharon [name], Sharon [name] 04-29-1946, MRN [id number]  PCP:  [name], NP  Cardiologist:  Ida Rogue, MD  Electrophysiologist:  Virl Axe, MD   Chief Complaint: Lower extremity swelling  History of Present Illness:   Sharon [name] is a 76 y.o. female with history of embolic CVA in 01/6269, coronary artery calcification, aortic atherosclerosis with stable dilatation of the descending thoracic aorta measuring up to 4 cm in 07/2021, stable mild AAA by ultrasound in 08/2021 followed by vascular surgery, COPD secondary to prior tobacco use quitting in 2017 with a 33-pack-year history, lymphedema, cervical cancer, asthma, and anxiety who presents for evaluation of lower extremity swelling.   She was admitted to the hospital in 12/2017 with slurred speech and weakness involving the left hand with associated facial droop.  She was found to have multiple right cerebral infarcts concerning for embolic phenomena.  Carotid artery ultrasound showed minor carotid artery atherosclerosis with no hemodynamically significant disease.  Surface echo showed an EF of 70% with no regional wall motion abnormalities.  TEE showed an EF of 55 to 65%, no regional wall motion abnormalities, no left atrial appendage thrombus, and no cardiac source of emboli identified with a normal sized aortic root, ascending thoracic aorta, aortic arch, and descending thoracic aorta.  She subsequently underwent ILR implantation with no evidence of A. fib identified to date.  Most recent echo from 09/2018 showed an EF of 55 to 60%, no regional wall motion abnormalities, grade 1 diastolic dysfunction, upper limit of normal PASP, no significant valvular abnormalities, and a small pericardial effusion identified posterior to the heart.  She was last seen in the office in 02/2021 for pre-procedure cardiac risk stratification and was doing well from a cardiac perspective.  She was referred to  the lipid clinic for further management of her hyperlipidemia, and was started on Praluent.  However, this has been discontinued secondary to injection site reaction and influenza-like symptoms.  She was recently admitted to the hospital in 09/2021 with contact/stasis dermatitis of the left lower extremity.  Venous Doppler was negative for DVT.  Initially, she was treated with antibiotics, though these were discontinued per ID recommendation.  She had notable improvement following initiation of steroid for contact dermatitis.  She has subsequently been followed by dermatology for venous stasis dermatitis of the left lower extremity.  With close follow-up, she has not noted improvement.  Earlier this week, furosemide was increased to 20 mg daily to see if this would help with her lower extremity swelling moving forward.  She comes in today doing reasonably well from a cardiac perspective.  No symptoms concerning for angina or decompensation.  Chronic dyspnea is overall stable.  She is without orthopnea, PND, or early satiety.  Her weight is down 5 pounds today when compared to her last clinic visit.  She does continue to note lower extremity swelling with the left lower extremity being greater than the right lower extremity.  No dizziness, presyncope, or syncope.  No falls.  She has follow-up with dermatology next week.  Left lower extremity remains wrapped.  She does watch her salt intake though does drink a large volume of liquid during the day.  She is working on decreasing this.    Labs independently reviewed: 10/2021 - Hgb 13.5, PLT 199, BUN 17, serum creatinine 0.84, potassium 4.3 09/2021 - A1c 6.6, albumin 3.6, AST/ALT normal 08/2021 - magnesium 2.1, TC 191,  TG 147, HDL 49, LDL 116, TSH normal  Past Medical History:  Diagnosis Date   Anxiety    Arthritis    hands, upper back   Asthma    COPD (chronic obstructive pulmonary disease) (HCC)    History of cervical cancer    Menopausal disorder     Osteoporosis    Pneumonia 1960   Spasm of abdominal muscles of right side    intermittent   Stroke ([place]) 2019   TMJ (dislocation of temporomandibular joint)    Wears dentures    partial lower    Past Surgical History:  Procedure Laterality Date   ABDOMINAL HYSTERECTOMY  1970's   bladder botox  2005   BLADDER SUSPENSION  2004   CATARACT EXTRACTION W/PHACO Right 03/09/2018   Procedure: CATARACT EXTRACTION PHACO AND INTRAOCULAR LENS PLACEMENT ([place]) right;  Surgeon: Eulogio Bear, MD;  Location: [facility];  Service: Ophthalmology;  Laterality: Right;  CALL CELL 1ST   CATARACT EXTRACTION W/PHACO Left 04/19/2018   Procedure: CATARACT EXTRACTION PHACO AND INTRAOCULAR LENS PLACEMENT ([facility])  LEFT;  Surgeon: Eulogio Bear, MD;  Location: [facility];  Service: Ophthalmology;  Laterality: Left;   COLONOSCOPY WITH PROPOFOL N/A 12/13/2015   Procedure: COLONOSCOPY WITH PROPOFOL;  Surgeon: Lucilla Lame, MD;  Location: [facility];  Service: Endoscopy;  Laterality: N/A;   COLONOSCOPY WITH PROPOFOL N/A 03/14/2021   Procedure: COLONOSCOPY WITH PROPOFOL;  Surgeon: Lucilla Lame, MD;  Location: [facility];  Service: Endoscopy;  Laterality: N/A;  diabetic   LOOP RECORDER INSERTION N/A 01/07/2018   Procedure: LOOP RECORDER INSERTION;  Surgeon: Deboraha Sprang, MD;  Location: [facility];  Service: Cardiovascular;  Laterality: N/A;   POLYPECTOMY N/A 12/13/2015   Procedure: POLYPECTOMY;  Surgeon: Lucilla Lame, MD;  Location: [facility];  Service: Endoscopy;  Laterality: N/A;  SIGMOID COLON POLYPS X  5   POLYPECTOMY N/A 03/14/2021   Procedure: POLYPECTOMY;  Surgeon: Lucilla Lame, MD;  Location: [facility];  Service: Endoscopy;  Laterality: N/A;   SHOULDER ARTHROSCOPY W/ ROTATOR CUFF REPAIR Right 1998   TEE WITHOUT CARDIOVERSION N/A 01/06/2018   Procedure: TRANSESOPHAGEAL ECHOCARDIOGRAM (TEE);  Surgeon: Minna Merritts, MD;  Location: [facility];   Service: Cardiovascular;  Laterality: N/A;   TONSILLECTOMY AND ADENOIDECTOMY      Current Medications: Current Meds  Medication Sig   acetaminophen (TYLENOL) 500 MG tablet Take 500 mg by mouth every 6 (six) hours as needed.   albuterol (PROAIR HFA) 108 (90 Base) MCG/ACT inhaler Inhale 2 puffs into the lungs every 6 (six) hours as needed for wheezing or shortness of breath.   Blood Glucose Monitoring Suppl (ONETOUCH VERIO) w/Device KIT Utilize to check blood sugar twice a day, fasting in morning with goal < 130 and then 2 hours after a meal with goal <180.  Document and bring to visits.   cefdinir (OMNICEF) 300 MG capsule Take 1 capsule (300 mg total) by mouth 2 (two) times daily.   Cholecalciferol 1.25 MG (50000 UT) TABS Take 1 tablet by mouth once a week.   citalopram (CELEXA) 20 MG tablet Take 1 tablet (20 mg total) by mouth as needed.   clopidogrel (PLAVIX) 75 MG tablet TAKE 1 TABLET(75 MG) BY MOUTH DAILY   Cyanocobalamin 1000 MCG/ML KIT Inject 1,000 mcg as directed every 30 (thirty) days.   desoximetasone (TOPICORT) 0.05 % cream Apply topically 2 (two) times daily.   furosemide (LASIX) 20 MG tablet Take 1 tablet (20 mg  total) by mouth daily.   glucose blood (ONETOUCH VERIO) test strip Utilize to check blood sugar twice a day, fasting in morning with goal < 130 and then 2 hours after a meal with goal <180.  Document and bring to visits.   Lancets (ONETOUCH ULTRASOFT) lancets Utilize to check blood sugar twice a day, fasting in morning with goal < 130 and then 2 hours after a meal with goal <180.  Document and bring to visits.   loratadine (CLARITIN) 10 MG tablet Take 1 tablet (10 mg total) by mouth daily.   metFORMIN (GLUCOPHAGE) 500 MG tablet Take 1 tablet (500 mg total) by mouth 2 (two) times daily with a meal.   pantoprazole (PROTONIX) 40 MG tablet TAKE 1 TABLET(40 MG) BY MOUTH DAILY   Tiotropium Bromide-Olodaterol 2.5-2.5 MCG/ACT AERS Inhale 2 puffs into the lungs daily.    Allergies:    Statins, Dermacerin [aquaphilic], Quinolones, Baclofen, Bactrim [sulfamethoxazole-trimethoprim], Ibuprofen, Latex, Librium [chlordiazepoxide], Naprosyn [naproxen], and Other   Social History   Socioeconomic History   Marital status: Married    Spouse name: Sharon [name]   Number of children: 1   Years of education: Not on file   Highest education level: Some college, no degree  Occupational History   Occupation: retired  Tobacco Use   Smoking status: Former    Years: 56.00    Types: Cigarettes    Quit date: 07/14/2016    Years since quitting: 5.3   Smokeless tobacco: Never  Vaping Use   Vaping Use: Former  Substance and Sexual Activity   Alcohol use: Not Currently    Comment: occassional   Drug use: No   Sexual activity: Not Currently    Birth control/protection: Post-menopausal  Other Topics Concern   Not on file  Social History Narrative   Lives with husband, manages farm   Social Determinants of Health   Financial Resource Strain: Low Risk    Difficulty of Paying Living Expenses: Not hard at all  Food Insecurity: No Food Insecurity   Worried About Charity fundraiser in the Last Year: Never true   Arboriculturist in the Last Year: Never true  Transportation Needs: No Transportation Needs   Lack of Transportation (Medical): No   Lack of Transportation (Non-Medical): No  Physical Activity: Inactive   Days of Exercise per Week: 0 days   Minutes of Exercise per Session: 0 min  Stress: Stress Concern Present   Feeling of Stress : Rather much  Social Connections: Not on file     Family History:  The patient's family history includes Diabetes in her mother; Heart disease in her mother; Stroke in her maternal grandmother and mother.  ROS:   Review of Systems  Constitutional:  Positive for malaise/fatigue. Negative for chills, diaphoresis, fever and weight loss.  HENT:  Negative for congestion.   Eyes:  Negative for discharge and redness.  Respiratory:  Positive for  shortness of breath. Negative for cough, sputum production and wheezing.        Chronic dyspnea  Cardiovascular:  Positive for leg swelling. Negative for chest pain, palpitations, orthopnea, claudication and PND.  Gastrointestinal:  Negative for blood in stool and melena.  Musculoskeletal:  Negative for falls and myalgias.  Skin:  Negative for rash.  Neurological:  Negative for dizziness, tingling, tremors, sensory change, speech change, focal weakness, loss of consciousness and weakness.  Endo/Heme/Allergies:  Does not bruise/bleed easily.  Psychiatric/Behavioral:  Negative for substance abuse. The patient is not nervous/anxious.  All other systems reviewed and are negative.   EKGs/Labs/Other Studies Reviewed:    Studies reviewed were summarized above. The additional studies were reviewed today:  2D echo 09/2018: - Left ventricle: The cavity size was normal. There was mild    concentric hypertrophy. Systolic function was normal. The    estimated ejection fraction was in the range of 55% to 60%. Wall    motion was normal; there were no regional wall motion    abnormalities. Doppler parameters are consistent with abnormal    left ventricular relaxation (grade 1 diastolic dysfunction).  - Pulmonary arteries: Systolic pressure was at the upper limits of    normal.  - Pericardium, extracardiac: A small pericardial effusion was    identified posterior to the heart. __________   TEE 12/2017: - Left ventricle: Systolic function was normal. The estimated    ejection fraction was in the range of 55% to 65%. Wall motion was    normal; there were no regional wall motion abnormalities.  - Left atrium: No evidence of thrombus in the atrial cavity or    appendage. No evidence of thrombus in the atrial cavity or    appendage.  - Right ventricle: Systolic function was normal.  - Right atrium: No evidence of thrombus in the atrial cavity or    appendage.  - Atrial septum: No defect or patent  foramen ovale was identified.    Echo contrast study showed no right-to-left atrial level shunt,    at baseline or with provocation.   Impressions:   - No cardiac source of emboli was indentified.  __________   2D echo 12/2017: - Procedure narrative: Transthoracic echocardiography. Image    quality was suboptimal. The study was technically difficult.  - Left ventricle: The cavity size was normal. Systolic function was    vigorous. The estimated ejection fraction was 70%. Wall motion    was normal; there were no regional wall motion abnormalities.  - Aortic valve: Valve area (Vmax): 2.92 cm^2.   Impressions:   - No cardiac source of emboli was indentified.   EKG:  EKG is ordered today.  The EKG ordered today demonstrates NSR, 89 bpm, low voltage QRS, baseline artifact, nonspecific ST-T change  Recent Labs: 09/10/2021: Magnesium 2.1; TSH 0.965 10/04/2021: ALT 17 10/15/2021: BUN 17; Creatinine, Ser 0.84; Potassium 4.3; Sodium 140 10/22/2021: Hemoglobin 13.5; Platelets 199  Recent Lipid Panel    Component Value Date/Time   CHOL 191 09/10/2021 1512   TRIG 147 09/10/2021 1512   HDL 49 09/10/2021 1512   CHOLHDL 3.5 03/21/2021 1020   VLDL 14 03/21/2021 1020   LDLCALC 116 (H) 09/10/2021 1512   LDLDIRECT 142.3 (H) 03/21/2021 1024    PHYSICAL EXAM:    VS:  BP 100/70 (BP Location: Left Arm, Patient Position: Sitting, Cuff Size: Large)    Pulse 89    Ht 5' 9" (1.753 m)    Wt 210 lb (95.3 kg)    LMP  (LMP Unknown)    SpO2 98%    BMI 31.01 kg/m   BMI: Body mass index is 31.01 kg/m.  Physical Exam Vitals reviewed.  Constitutional:      Appearance: She is well-developed.  HENT:     Head: Normocephalic and atraumatic.  Eyes:     General:        Right eye: No discharge.        Left eye: No discharge.  Neck:     Vascular: No JVD.  Cardiovascular:  Rate and Rhythm: Normal rate and regular rhythm.     Heart sounds: Normal heart sounds, S1 normal and S2 normal. Heart sounds not  distant. No midsystolic click and no opening snap. No murmur heard.   No friction rub.  Pulmonary:     Effort: Pulmonary effort is normal. No respiratory distress.     Breath sounds: Normal breath sounds. No decreased breath sounds, wheezing or rales.  Chest:     Chest wall: No tenderness.  Abdominal:     General: There is no distension.     Palpations: Abdomen is soft.     Tenderness: There is no abdominal tenderness.  Musculoskeletal:     Cervical back: Normal range of motion.     Right lower leg: Edema present.     Left lower leg: Edema present.     Comments: Lower extremity swelling to just below the knee bilaterally with the left lower extremity being worse than the right.  Left lower extremity is wrapped with a small amount of erythema noted along the proximal aspect upon slightly lowering the wrap.  Knee-high compression stocking is noted along the right lower extremity.  Skin:    General: Skin is warm and dry.     Nails: There is no clubbing.  Neurological:     Mental Status: She is alert and oriented to person, place, and time.  Psychiatric:        Speech: Speech normal.        Behavior: Behavior normal.        Thought Content: Thought content normal.        Judgment: Judgment normal.    Wt Readings from Last 3 Encounters:  11/08/21 210 lb (95.3 kg)  10/22/21 211 lb (95.7 kg)  10/15/21 211 lb (95.7 kg)     ASSESSMENT & PLAN:   History of CVA: No new deficits.  ILR has shown no evidence of A. fib.  Management of clopidogrel per PCP.  Coronary artery calcification/aortic atherosclerosis/HLD: LDL 116 in 08/2021.  Intolerant to moderate and high intensity statins along with and PCSK9 inhibitor.  For now, we will defer further escalation of lipid-lowering therapy while she is being evaluated for lower extremity swelling.  Dilated ascending thoracic aorta: Stable on recent imaging.  Continued optimal blood pressure control is recommended.  AAA: Noted on ultrasound in  08/2020 measuring 3.3 cm.  Most recent abdominal ultrasound from 08/2021 appeared to be stable.  Followed by vascular surgery.  Lower extremity edema: Has been suspected to be venous stasis dermatitis with history of lymphedema.  Now followed by dermatology.  Now on increased dose of furosemide per PCP.  I agree with this trial.  Schedule echo.  Could consider abdominal imaging to evaluate for any compressive process.  Diastolic dysfunction: She appears well compensated.  Undergoing a trial of titrated Lasix as outlined above secondary to lower extremity swelling.  Schedule echo as above.   Disposition: F/u with Dr. Rockey Situ or an APP in 6 months, and EP as directed.   Medication Adjustments/Labs and Tests Ordered: Current medicines are reviewed at length with the patient today.  Concerns regarding medicines are outlined above. Medication changes, Labs and Tests ordered today are summarized above and listed in the Patient Instructions accessible in Encounters.   Signed, Christell Faith, PA-C 11/08/2021 4:23 PM     [facility] [address] [place], [place] [address] [phone]

## 2021-11-08 ENCOUNTER — Ambulatory Visit (INDEPENDENT_AMBULATORY_CARE_PROVIDER_SITE_OTHER): Payer: Medicare Other | Admitting: Physician Assistant

## 2021-11-08 ENCOUNTER — Encounter: Payer: Self-pay | Admitting: Physician Assistant

## 2021-11-08 ENCOUNTER — Other Ambulatory Visit: Payer: Self-pay

## 2021-11-08 VITALS — BP 100/70 | HR 89 | Ht 69.0 in | Wt 210.0 lb

## 2021-11-08 DIAGNOSIS — I5189 Other ill-defined heart diseases: Secondary | ICD-10-CM | POA: Diagnosis not present

## 2021-11-08 DIAGNOSIS — Z8673 Personal history of transient ischemic attack (TIA), and cerebral infarction without residual deficits: Secondary | ICD-10-CM | POA: Diagnosis not present

## 2021-11-08 DIAGNOSIS — I77819 Aortic ectasia, unspecified site: Secondary | ICD-10-CM | POA: Diagnosis not present

## 2021-11-08 DIAGNOSIS — E785 Hyperlipidemia, unspecified: Secondary | ICD-10-CM

## 2021-11-08 DIAGNOSIS — E1169 Type 2 diabetes mellitus with other specified complication: Secondary | ICD-10-CM

## 2021-11-08 DIAGNOSIS — I2510 Atherosclerotic heart disease of native coronary artery without angina pectoris: Secondary | ICD-10-CM | POA: Diagnosis not present

## 2021-11-08 DIAGNOSIS — I700 Atherosclerosis of aorta: Secondary | ICD-10-CM

## 2021-11-08 DIAGNOSIS — I714 Abdominal aortic aneurysm, without rupture, unspecified: Secondary | ICD-10-CM | POA: Diagnosis not present

## 2021-11-08 DIAGNOSIS — M7989 Other specified soft tissue disorders: Secondary | ICD-10-CM

## 2021-11-08 NOTE — Patient Instructions (Signed)
Medication Instructions:  No changes at this time.  *If you need a refill on your cardiac medications before your next appointment, please call your pharmacy*   Lab Work: None  If you have labs (blood work) drawn today and your tests are completely normal, you will receive your results only by: MyChart Message (if you have MyChart) OR A paper copy in the mail If you have any lab test that is abnormal or we need to change your treatment, we will call you to review the results.   Testing/Procedures: Your physician has requested that you have an echocardiogram.   Echocardiography is a painless test that uses sound waves to create images of your heart. It provides your doctor with information about the size and shape of your heart and how well your heart's chambers and valves are working. This procedure takes approximately one hour. There are no restrictions for this procedure.    Follow-Up: At [facility], you and your health needs are our priority.  As part of our continuing mission to provide you with exceptional heart care, we have created designated Provider Care Teams.  These Care Teams include your primary Cardiologist (physician) and Advanced Practice Providers (APPs -  Physician Assistants and Nurse Practitioners) who all work together to provide you with the care you need, when you need it.   Your next appointment:   6 month(s)  The format for your next appointment:   In Person  Provider:   Timothy Gollan, MD or Ryan Dunn, PA-C   

## 2021-11-11 ENCOUNTER — Ambulatory Visit: Payer: Medicare Other | Admitting: Family Medicine

## 2021-11-12 ENCOUNTER — Other Ambulatory Visit: Payer: Self-pay

## 2021-11-12 ENCOUNTER — Ambulatory Visit (INDEPENDENT_AMBULATORY_CARE_PROVIDER_SITE_OTHER): Payer: Medicare Other | Admitting: Dermatology

## 2021-11-12 ENCOUNTER — Ambulatory Visit: Payer: Medicare Other | Admitting: Dermatology

## 2021-11-12 DIAGNOSIS — L821 Other seborrheic keratosis: Secondary | ICD-10-CM | POA: Diagnosis not present

## 2021-11-12 DIAGNOSIS — I872 Venous insufficiency (chronic) (peripheral): Secondary | ICD-10-CM | POA: Diagnosis not present

## 2021-11-12 MED ORDER — CEFDINIR 300 MG PO CAPS: 300.0000 mg | ORAL_CAPSULE | Freq: Two times a day (BID) | ORAL | 0 refills | Status: DC

## 2021-11-12 NOTE — Patient Instructions (Addendum)
Continue desoximetasone cream twice a day to left lower leg x 2 weeks, then decrease to once daily.  Continue Juxtalite compression applying every morning.   Continue cefdinir 300mg  1 pill twice daily for an additional week.  Stasis in the legs causes chronic leg swelling, which may result in itchy or painful rashes, skin discoloration, skin texture changes, and sometimes ulceration.  Recommend daily graduated compression hose/stockings- easiest to put on first thing in morning, remove at bedtime.  Elevate legs as much as possible. Avoid salt/sodium rich foods.   If You Need Anything After Your Visit  If you have any questions or concerns for your doctor, please call our main line at [phone] and press option 4 to reach your doctor's medical assistant. If no one answers, please leave a voicemail as directed and we will return your call as soon as possible. Messages left after 4 pm will be answered the following business day.   You may also send [place] a message via [place]. We typically respond to MyChart messages within 1-2 business days.  For prescription refills, please ask your pharmacy to contact our office. Our fax number is [phone].  If you have an urgent issue when the clinic is closed that cannot wait until the next business day, you can page your doctor at the number below.    Please note that while we do our best to be available for urgent issues outside of office hours, we are not available 24/7.   If you have an urgent issue and are unable to reach [place], you may choose to seek medical care at your doctor's office, retail clinic, urgent care center, or emergency room.  If you have a medical emergency, please immediately call 911 or go to the emergency department.  Pager Numbers  - Dr. Nehemiah Massed: [phone]  - Dr. Laurence Ferrari: [phone]  - Dr. Nicole Kindred: [phone]  In the event of inclement weather, please call our main line at [phone] for an update on the status of  any delays or closures.  Dermatology Medication Tips: Please keep the boxes that topical medications come in in order to help keep track of the instructions about where and how to use these. Pharmacies typically print the medication instructions only on the boxes and not directly on the medication tubes.   If your medication is too expensive, please contact our office at [phone] option 4 or send [place] a message through [place].   We are unable to tell what your co-pay for medications will be in advance as this is different depending on your insurance coverage. However, we may be able to find a substitute medication at lower cost or fill out paperwork to get insurance to cover a needed medication.   If a prior authorization is required to get your medication covered by your insurance company, please allow [place] 1-2 business days to complete this process.  Drug prices often vary depending on where the prescription is filled and some pharmacies may offer cheaper prices.  The website www.goodrx.com contains coupons for medications through different pharmacies. The prices here do not account for what the cost may be with help from insurance (it may be cheaper with your insurance), but the website can give you the price if you did not use any insurance.  - You can print the associated coupon and take it with your prescription to the pharmacy.  - You may also stop by our office during regular business hours and pick up a GoodRx coupon card.  -  If you need your prescription sent electronically to a different pharmacy, notify our office through [facility] or by phone at [phone] option 4.     Si Usted Necesita Algo Despus de Su Visita  Tambin puede enviarnos un mensaje a travs de Pharmacist, community. Por lo general respondemos a los mensajes de MyChart en el transcurso de 1 a 2 das hbiles.  Para renovar recetas, por favor pida a su farmacia que se ponga en contacto con nuestra oficina. Harland Dingwall de fax es [place] [phone].  Si tiene un asunto urgente cuando la clnica est cerrada y que no puede esperar hasta el siguiente da hbil, puede llamar/localizar a su doctor(a) al nmero que aparece a continuacin.   Por favor, tenga en cuenta que aunque hacemos todo lo posible para estar disponibles para asuntos urgentes fuera del horario de [place], no estamos disponibles las 24 horas del da, los 7 das de la [place].   Si tiene un problema urgente y no puede comunicarse con nosotros, puede optar por buscar atencin mdica  en el consultorio de su doctor(a), en una clnica privada, en un centro de atencin urgente o en una sala de emergencias.  Si tiene Engineering geologist, por favor llame inmediatamente al 911 o vaya a la sala de emergencias.  Nmeros de bper  - Dr. Nehemiah Massed: [phone]  - Dra. Moye: [phone]  - Dra. Nicole Kindred: [phone]  En caso de inclemencias del [place], por favor llame a Johnsie Kindred principal al [phone] para una actualizacin sobre el [place] de cualquier retraso o cierre.  Consejos para la medicacin en dermatologa: Por favor, guarde las cajas en las que vienen los medicamentos de uso tpico para ayudarle a seguir las instrucciones sobre dnde y cmo usarlos. Las farmacias generalmente imprimen las instrucciones del medicamento slo en las cajas y no directamente en los tubos del [place].   Si su medicamento es muy caro, por favor, pngase en contacto con Zigmund Daniel llamando al [phone] y presione la opcin 4 o envenos un mensaje a travs de Pharmacist, community.   No podemos decirle cul ser su copago por los medicamentos por adelantado ya que esto es diferente dependiendo de la cobertura de su seguro. Sin embargo, es posible que podamos encontrar un medicamento sustituto a Electrical engineer un formulario para que el seguro cubra el medicamento que se considera necesario.   Si se requiere una autorizacin previa para que su compaa de  seguros [place] su medicamento, por favor permtanos de 1 a 2 das hbiles para completar este proceso.  Los precios de los medicamentos varan con frecuencia dependiendo del Environmental consultant de dnde se surte la receta y alguna farmacias pueden ofrecer precios ms baratos.  El sitio web www.goodrx.com tiene cupones para medicamentos de Airline pilot. Los precios aqu no tienen en cuenta lo que podra costar con la ayuda del seguro (puede ser ms barato con su seguro), pero el sitio web puede darle el precio si no utiliz Research scientist (physical sciences).  - Puede imprimir el cupn correspondiente y llevarlo con su receta a la farmacia.  - Tambin puede pasar por nuestra oficina durante el horario de atencin regular y Charity fundraiser una tarjeta de cupones de GoodRx.  - Si necesita que su receta se enve electrnicamente a una farmacia diferente, informe a nuestra oficina a travs de MyChart de [place] o por telfono llamando al [phone] y presione la opcin 4.

## 2021-11-12 NOTE — Progress Notes (Signed)
° °  Follow-Up Visit   Subjective  Sharon [name] is a 76 y.o. female who presents for the following: Stasis Dermatitis (Left lower leg. Much improved after starting cefdinir 300mg  BID. She has 1 pill left and wonders if she needs to continue for 1 more week. She is using desoximetasone cream twice daily. She continues to use lymphedema pump. ) Patient's cardiologist said patient should not increase diuretic, but advised her to decrease water intake.    The following portions of the chart were reviewed this encounter and updated as appropriate:       Review of Systems:  No other skin or systemic complaints except as noted in HPI or Assessment and Plan.  Objective  Well appearing patient in no apparent distress; mood and affect are within normal limits.  A focused examination was performed including left lower leg. Relevant physical exam findings are noted in the Assessment and Plan.  Left Lower Leg Pitting edema; decreased erythema compared to previous photos.     Assessment & Plan  Venous stasis dermatitis of left lower extremity Left Lower Leg  Improving.  Not at goal  Continue 1 more week cefdinir 300mg  take 1 po BID dsp #140Rf. Continue desoximetasone 0.05% cream BID x 1 more week, then decrease to QD. Continue Juxtalite compression daily.  Continue lymphedema pump 4x/day as tolerated, 15 minutes.  Pt will drink less water.  Stasis in the legs causes chronic leg swelling, which may result in itchy or painful rashes, skin discoloration, skin texture changes, and sometimes ulceration.  Recommend daily graduated compression hose/stockings- easiest to put on first thing in morning, remove at bedtime.  Elevate legs as much as possible. Avoid salt/sodium rich foods. Decrease water intake since not able to increase diuretic.  Related Medications desoximetasone (TOPICORT) 0.05 % cream Apply topically 2 (two) times daily.  cefdinir (OMNICEF) 300 MG capsule Take 1 capsule (300 mg  total) by mouth 2 (two) times daily.  Seborrheic Keratoses - Stuck-on, waxy, tan-brown macules/papules of the left lower leg - Benign-appearing - Discussed benign etiology and prognosis. - Observe - Call for any changes  Return in about 2 weeks (around 11/26/2021) for Stasis Dermatitis.  IJamesetta Orleans, CMA, am acting as scribe for Brendolyn Patty, MD . Documentation: I have reviewed the above documentation for accuracy and completeness, and I agree with the above.  Brendolyn Patty MD

## 2021-11-14 ENCOUNTER — Ambulatory Visit: Payer: Medicare Other | Admitting: Family Medicine

## 2021-11-15 NOTE — Progress Notes (Signed)
Carelink Summary Report / Loop Recorder 

## 2021-11-22 ENCOUNTER — Other Ambulatory Visit: Payer: Self-pay

## 2021-11-22 ENCOUNTER — Ambulatory Visit (INDEPENDENT_AMBULATORY_CARE_PROVIDER_SITE_OTHER): Payer: Medicare Other

## 2021-11-22 ENCOUNTER — Other Ambulatory Visit: Payer: Self-pay | Admitting: Nurse Practitioner

## 2021-11-22 DIAGNOSIS — E538 Deficiency of other specified B group vitamins: Secondary | ICD-10-CM

## 2021-11-22 MED ORDER — CYANOCOBALAMIN 1000 MCG/ML IJ SOLN
1000.0000 ug | Freq: Once | INTRAMUSCULAR | Status: AC
  Administered 2021-11-22: 1000 ug via INTRAMUSCULAR

## 2021-11-22 NOTE — Telephone Encounter (Signed)
Requested medications are due for refill today.  no  Requested medications are on the active medications list.  no  Last refill. 05/01/2021  Future visit scheduled.   yes  Notes to clinic.  Medication was discontinued 09/10/2021.    Requested Prescriptions  Pending Prescriptions Disp Refills   SYMBICORT 160-4.5 MCG/ACT inhaler [Pharmacy Med Name: SYMBICORT 160/4.5MCG (120 ORAL INH)] 10.2 g 6    Sig: INHALE 2 PUFFS BY MOUTH TWICE DAILY     Pulmonology:  Combination Products Passed - 11/22/2021  6:48 AM      Passed - Valid encounter within last 12 months    Recent Outpatient Visits           1 month ago Cellulitis of left lower extremity   [place], Megan P, DO   1 month ago Centrilobular emphysema ([facility])   [place], [place]ner T, NP   1 month ago Cellulitis of left leg   [place], [place] T, NP   2 months ago Cellulitis of left leg   [place], Lamar T, NP   2 months ago Type 2 diabetes mellitus with obesity ([place])   [place], Jolene T, NP       Future Appointments             In 3 days Brendolyn Patty, MD [place]   In 4 days [name], NP [facility], [place]   In 2 weeks Deboraha Sprang, MD [facility] [place]   In 5 months  [facility], [place]   In 5 months Dunn, Areta Haber, PA-C [place], [place]

## 2021-11-24 NOTE — Patient Instructions (Signed)
Atopic Dermatitis Atopic dermatitis is a skin disorder that causes inflammation of the skin. It is marked by a red rash and itchy, dry, scaly skin. It is the most common type of eczema. Eczema is a group of skin conditions that cause the skin to become rough and swollen. This condition is generally worse during the cooler wintermonths and often improves during the warm summer months. Atopic dermatitis usually starts showing signs in infancy and can last through adulthood. This condition cannot be passed from one person to another (is not contagious). Atopic dermatitis may not always be present, but when it is, it is called aflare-up. What are the causes? The exact cause of this condition is not known. Flare-ups may be triggered by: Coming in contact with something that you are sensitive or allergic to (allergen). Stress. Certain foods. Extremely hot or cold weather. Harsh chemicals and soaps. Dry air. Chlorine. What increases the risk? This condition is more likely to develop in people who have a personal or family history of: Eczema. Allergies. Asthma. Hay fever. What are the signs or symptoms? Symptoms of this condition include: Dry, scaly skin. Red, itchy rash. Itchiness, which can be severe. This may occur before the skin rash. This can make sleeping difficult. Skin thickening and cracking that can occur over time. How is this diagnosed? This condition is diagnosed based on: Your symptoms. Your medical history. A physical exam. How is this treated? There is no cure for this condition, but symptoms can usually be controlled. Treatment focuses on: Controlling the itchiness and scratching. You may be given medicines, such as antihistamines or steroid creams. Limiting exposure to allergens. Recognizing situations that cause stress and developing a plan to manage stress. If your atopic dermatitis does not get better with medicines, or if it is all over your body (widespread), a  treatment using a specific type of light (phototherapy) may be used. Follow these instructions at home: Skin care  Keep your skin well moisturized. Doing this seals in moisture and helps to prevent dryness. Use unscented lotions that have petroleum in them. Avoid lotions that contain alcohol or water. They can dry the skin. Keep baths or showers short (less than 5 minutes) in warm water. Do not use hot water. Use mild, unscented cleansers for bathing. Avoid soap and bubble bath. Apply a moisturizer to your skin right after a bath or shower. Do not apply anything to your skin without checking with your health care provider.  General instructions Take or apply over-the-counter and prescription medicines only as told by your health care provider. Dress in clothes made of cotton or cotton blends. Dress lightly because heat increases itchiness. When washing your clothes, rinse your clothes twice so all of the soap is removed. Avoid any triggers that can cause a flare-up. Keep your fingernails cut short. Avoid scratching. Scratching makes the rash and itchiness worse. A break in the skin from scratching could result in a skin infection (impetigo). Do not be around people who have cold sores or fever blisters. If you get the infection, it may cause your atopic dermatitis to worsen. Keep all follow-up visits. This is important. Contact a health care provider if: Your itchiness interferes with sleep. Your rash gets worse or is not better within one week of starting treatment. You have a fever. You have a rash flare-up after having contact with someone who has cold sores or fever blisters. Get help right away if: You develop pus or soft yellow scabs in the rash area.   Summary Atopic dermatitis causes a red rash and itchy, dry, scaly skin. Treatment focuses on controlling the itchiness and scratching, limiting exposure to things that you are sensitive or allergic to (allergens), recognizing  situations that cause stress, and developing a plan to manage stress. Keep your skin well moisturized. Keep baths or showers shorter than 5 minutes and use warm water. Do not use hot water. This information is not intended to replace advice given to you by your health care provider. Make sure you discuss any questions you have with your healthcare provider. Document Revised: 07/09/2020 Document Reviewed: 07/09/2020 Elsevier Patient Education  2022 Elsevier Inc.  

## 2021-11-25 ENCOUNTER — Other Ambulatory Visit: Payer: Self-pay

## 2021-11-25 ENCOUNTER — Ambulatory Visit: Payer: Medicare Other | Admitting: Nurse Practitioner

## 2021-11-25 ENCOUNTER — Ambulatory Visit (INDEPENDENT_AMBULATORY_CARE_PROVIDER_SITE_OTHER): Payer: Medicare Other | Admitting: Dermatology

## 2021-11-25 DIAGNOSIS — I872 Venous insufficiency (chronic) (peripheral): Secondary | ICD-10-CM

## 2021-11-25 NOTE — Progress Notes (Signed)
° °  Follow-Up Visit   Subjective  Sharon [name] is a 76 y.o. female who presents for the following: Stasis dermatitis  (L lower leg, 2 wk f/u, finished Cefdinir, Desoximetasone cr qhs, Juxtalite compression, lymphedema pump qd, tender).   The following portions of the chart were reviewed this encounter and updated as appropriate:       Review of Systems:  No other skin or systemic complaints except as noted in HPI or Assessment and Plan.  Objective  Well appearing patient in no apparent distress; mood and affect are within normal limits.  A focused examination was performed including left leg. Relevant physical exam findings are noted in the Assessment and Plan.  Left Lower Leg - Anterior Pitting edema, peau d'orange textural change, decreased erythema with some fading of upper pretibia and calf         Assessment & Plan  Venous stasis dermatitis of left lower extremity Left Lower Leg - Anterior  Chronic and persistent condition with duration or expected duration over one year. Condition is symptomatic/ bothersome to patient. Some improvement. Not currently at goal.  Finished 2 wk course Cefdinir Cont Desoximetasone cream qam to aa L lower leg, avoid f/g/a Start Tacrolimus 0.1% oint qhs to aa L lower leg (pt has prescription at home) Cont Juxtalite compression garment qd Cont Lymphedema pump several times a day   Stasis in the legs causes chronic leg swelling, which may result in itchy or painful rashes, skin discoloration, skin texture changes, and sometimes ulceration.  Recommend daily graduated compression hose/stockings- easiest to put on first thing in morning, remove at bedtime.  Elevate legs as much as possible. Avoid salt/sodium rich foods.     Related Medications desoximetasone (TOPICORT) 0.05 % cream Apply topically 2 (two) times daily.  cefdinir (OMNICEF) 300 MG capsule Take 1 capsule (300 mg total) by mouth 2 (two) times daily.   Return in about 1 month  (around 12/23/2021) for stasis derm.  I, Othelia Pulling, RMA, am acting as scribe for Brendolyn Patty, MD .  Documentation: I have reviewed the above documentation for accuracy and completeness, and I agree with the above.  Brendolyn Patty MD

## 2021-11-25 NOTE — Patient Instructions (Addendum)
Start Tacrolimus 0.1% ointment once a day to left lower leg Cont Desoximetasone cream once a day to left lower leg Cont wearing Juxtalite compression garment daily   If You Need Anything After Your Visit  If you have any questions or concerns for your doctor, please call our main line at [phone] and press option 4 to reach your doctor's medical assistant. If no one answers, please leave a voicemail as directed and we will return your call as soon as possible. Messages left after 4 pm will be answered the following business day.   You may also send [place] a message via [place]. We typically respond to MyChart messages within 1-2 business days.  For prescription refills, please ask your pharmacy to contact our office. Our fax number is [phone].  If you have an urgent issue when the clinic is closed that cannot wait until the next business day, you can page your doctor at the number below.    Please note that while we do our best to be available for urgent issues outside of office hours, we are not available 24/7.   If you have an urgent issue and are unable to reach [place], you may choose to seek medical care at your doctor's office, retail clinic, urgent care center, or emergency room.  If you have a medical emergency, please immediately call 911 or go to the emergency department.  Pager Numbers  - Dr. Nehemiah Massed: [phone]  - Dr. Laurence Ferrari: [phone]  - Dr. Nicole Kindred: [phone]  In the event of inclement weather, please call our main line at [phone] for an update on the status of any delays or closures.  Dermatology Medication Tips: Please keep the boxes that topical medications come in in order to help keep track of the instructions about where and how to use these. Pharmacies typically print the medication instructions only on the boxes and not directly on the medication tubes.   If your medication is too expensive, please contact our office at [phone] option 4 or send  [place] a message through [place].   We are unable to tell what your co-pay for medications will be in advance as this is different depending on your insurance coverage. However, we may be able to find a substitute medication at lower cost or fill out paperwork to get insurance to cover a needed medication.   If a prior authorization is required to get your medication covered by your insurance company, please allow [place] 1-2 business days to complete this process.  Drug prices often vary depending on where the prescription is filled and some pharmacies may offer cheaper prices.  The website www.goodrx.com contains coupons for medications through different pharmacies. The prices here do not account for what the cost may be with help from insurance (it may be cheaper with your insurance), but the website can give you the price if you did not use any insurance.  - You can print the associated coupon and take it with your prescription to the pharmacy.  - You may also stop by our office during regular business hours and pick up a GoodRx coupon card.  - If you need your prescription sent electronically to a different pharmacy, notify our office through [facility] or by phone at [phone] option 4.     Si Usted Necesita Algo Despus de Su Visita  Tambin puede enviarnos un mensaje a travs de Pharmacist, community. Por lo general respondemos a los mensajes de MyChart en el transcurso de 1 a 2 das  hbiles.  Para renovar recetas, por favor pida a su farmacia que se ponga en contacto con nuestra oficina. Harland Dingwall de fax es [place] [phone].  Si tiene un asunto urgente cuando la clnica est cerrada y que no puede esperar hasta el siguiente da hbil, puede llamar/localizar a su doctor(a) al nmero que aparece a continuacin.   Por favor, tenga en cuenta que aunque hacemos todo lo posible para estar disponibles para asuntos urgentes fuera del horario de [place], no estamos disponibles las 24 horas del da,  los 7 das de la [place].   Si tiene un problema urgente y no puede comunicarse con nosotros, puede optar por buscar atencin mdica  en el consultorio de su doctor(a), en una clnica privada, en un centro de atencin urgente o en una sala de emergencias.  Si tiene Engineering geologist, por favor llame inmediatamente al 911 o vaya a la sala de emergencias.  Nmeros de bper  - Dr. Nehemiah Massed: [phone]  - Dra. Moye: [phone]  - Dra. Nicole Kindred: [phone]  En caso de inclemencias del [place], por favor llame a Johnsie Kindred principal al [phone] para una actualizacin sobre el [place] de cualquier retraso o cierre.  Consejos para la medicacin en dermatologa: Por favor, guarde las cajas en las que vienen los medicamentos de uso tpico para ayudarle a seguir las instrucciones sobre dnde y cmo usarlos. Las farmacias generalmente imprimen las instrucciones del medicamento slo en las cajas y no directamente en los tubos del [place].   Si su medicamento es muy caro, por favor, pngase en contacto con Zigmund Daniel llamando al [phone] y presione la opcin 4 o envenos un mensaje a travs de Pharmacist, community.   No podemos decirle cul ser su copago por los medicamentos por adelantado ya que esto es diferente dependiendo de la cobertura de su seguro. Sin embargo, es posible que podamos encontrar un medicamento sustituto a Electrical engineer un formulario para que el seguro cubra el medicamento que se considera necesario.   Si se requiere una autorizacin previa para que su compaa de seguros [place] su medicamento, por favor permtanos de 1 a 2 das hbiles para completar este proceso.  Los precios de los medicamentos varan con frecuencia dependiendo del Environmental consultant de dnde se surte la receta y alguna farmacias pueden ofrecer precios ms baratos.  El sitio web www.goodrx.com tiene cupones para medicamentos de Airline pilot. Los precios aqu no tienen en cuenta lo que podra costar  con la ayuda del seguro (puede ser ms barato con su seguro), pero el sitio web puede darle el precio si no utiliz Research scientist (physical sciences).  - Puede imprimir el cupn correspondiente y llevarlo con su receta a la farmacia.  - Tambin puede pasar por nuestra oficina durante el horario de atencin regular y Charity fundraiser una tarjeta de cupones de GoodRx.  - Si necesita que su receta se enve electrnicamente a una farmacia diferente, informe a nuestra oficina a travs de MyChart de [place] o por telfono llamando al [phone] y presione la opcin 4.

## 2021-11-26 ENCOUNTER — Encounter: Payer: Self-pay | Admitting: Nurse Practitioner

## 2021-11-26 ENCOUNTER — Ambulatory Visit (INDEPENDENT_AMBULATORY_CARE_PROVIDER_SITE_OTHER): Payer: Medicare Other | Admitting: Nurse Practitioner

## 2021-11-26 VITALS — BP 126/77 | HR 83 | Temp 97.8°F | Ht 69.0 in | Wt 212.8 lb

## 2021-11-26 DIAGNOSIS — L03116 Cellulitis of left lower limb: Secondary | ICD-10-CM | POA: Diagnosis not present

## 2021-11-26 DIAGNOSIS — I890 Lymphedema, not elsewhere classified: Secondary | ICD-10-CM | POA: Diagnosis not present

## 2021-11-26 DIAGNOSIS — I9589 Other hypotension: Secondary | ICD-10-CM

## 2021-11-26 DIAGNOSIS — I959 Hypotension, unspecified: Secondary | ICD-10-CM | POA: Insufficient documentation

## 2021-11-26 DIAGNOSIS — E6609 Other obesity due to excess calories: Secondary | ICD-10-CM

## 2021-11-26 DIAGNOSIS — Z6831 Body mass index (BMI) 31.0-31.9, adult: Secondary | ICD-10-CM

## 2021-11-26 NOTE — Assessment & Plan Note (Signed)
BMI 31.43.  Recommended eating smaller high protein, low fat meals more frequently and exercising 30 mins a day 5 times a week with a goal of 10-15lb weight loss in the next 3 months. Patient voiced their understanding and motivation to adhere to these recommendations.

## 2021-11-26 NOTE — Assessment & Plan Note (Signed)
Post infection, improved at this time with no symptoms.  Continue good fluid intake at home and monitor.

## 2021-11-26 NOTE — Assessment & Plan Note (Signed)
Chronic, ongoing.  Continue use of compression hose daily.  Continue collaboration with vascular and dermatology team.  Appreciate their input.  Will place new referral for massage therapy locally, as is in need of this.

## 2021-11-26 NOTE — Progress Notes (Signed)
BP 126/77    Pulse 83    Temp 97.8 F (36.6 C) (Oral)    Ht 5\' 9"  (1.753 m)    Wt 212 lb 12.8 oz (96.5 kg)    LMP  (LMP Unknown)    SpO2 97%    BMI 31.43 kg/m    Subjective:    Patient ID: Sharon [name], female    DOB: [birth year], 76 y.o.   MRN: [id number]  HPI: Sharon [name] is a 76 y.o. female  Chief Complaint  Patient presents with   Hypotension    Patient states she has noticed her blood pressure readings have been low and she has not had any energy and she was recently given her B-12 injection. Patient states she is still having the burning sensation in her knuckles. Patient states she has recent blood work and it showed her WBC was elevated and states she was not happy with the follow up appointment.    Leg Pain   Medication Dose Change    Patient states that the 2 tablets were too much for her as they were causing issues with her knuckles in her hands. Patient states she has cut back down to 1 tablets.    Edema    Patient states she is having swelling in her thigh and she is thinking that she needs some lymph therapy treatments. Patient state she would like to discuss with provider at today's visit.    LYMPHEDEMA WITH CHRONIC VENOUS INSUFFICIENCY: Saw vascular last 09/26/21.  Was seeing massage therapist which she reports benefit from in past + using Juxtalite compression and lymphedema pump -- last massage therapy visit was 09/09/21 -- per recent notes it is felt she does not need OT any further, they are recommending joint injections.  Recent labs by dermatology noted WBC 14.5 and Neuts 12.2 on 10/22/21.  Does endorses difficulty being off feet, due to caring for husband who receives dialysis 3 times a week.  Does take Tylenol for pain, which benefits at times.    Being treated at present for dermatology for ongoing issues with venous stasis dermatitis to left lower extremity.  Has been ongoing issue since 09/10/21.  Saw dermatology last 11/25/21 -- just completed course of  Cefdinir and to continue Desoximetasone QAM. Duration: months Location: bilateral -- with acute to LLE History of trauma in area: no Pain: yes Quality: yes Severity: 6/10 at worst and today 3/10  Redness: yes Swelling: yes Oozing: no Pus: no Fevers: no Nausea/vomiting: no Status: fluctuating Treatments attempted:antibiotics  Tetanus: UTD   HYPOTENSION Currently improving levels -- saw cardiology on 11/08/21.  Does have elevated LDL -- poor tolerance of statins and Zetia, had trial of Praluent this caused a rash on area of injection.  Last A1c in December 6.6% we tried going up on Metformin to twice a day and this caused joint pains.   Recurrent headaches: no Visual changes: no Palpitations: no Dyspnea: no Chest pain: no Lower extremity edema: no Dizzy/lightheaded: no    VITAMIN B12 DEFICIENCY: Continues on monthly injections.  Last level in November 1017.  Has Vitamin D deficiency, continues supplement with recent level 49.4.   Relevant past medical, surgical, family and social history reviewed and updated as indicated. Interim medical history since our last visit reviewed. Allergies and medications reviewed and updated.  Review of Systems  Constitutional:  Negative for activity change, appetite change, diaphoresis, fatigue and fever.  Respiratory:  Negative for cough, chest tightness and shortness of breath.  Cardiovascular:  Negative for chest pain, palpitations and leg swelling.  Gastrointestinal: Negative.   Neurological: Negative.   Psychiatric/Behavioral: Negative.     Per HPI unless specifically indicated above     Objective:    BP 126/77    Pulse 83    Temp 97.8 F (36.6 C) (Oral)    Ht 5\' 9"  (1.753 m)    Wt 212 lb 12.8 oz (96.5 kg)    LMP  (LMP Unknown)    SpO2 97%    BMI 31.43 kg/m   Wt Readings from Last 3 Encounters:  11/26/21 212 lb 12.8 oz (96.5 kg)  11/08/21 210 lb (95.3 kg)  10/22/21 211 lb (95.7 kg)    Physical Exam Vitals and nursing note  reviewed.  Constitutional:      General: She is awake. She is not in acute distress.    Appearance: She is well-developed and well-groomed. She is not ill-appearing or toxic-appearing.  HENT:     Head: Normocephalic.     Right Ear: Hearing normal.     Left Ear: Hearing normal.  Eyes:     General: Lids are normal.        Right eye: No discharge.        Left eye: No discharge.     Conjunctiva/sclera: Conjunctivae normal.  Cardiovascular:     Rate and Rhythm: Normal rate and regular rhythm.     Heart sounds: No murmur heard.   No gallop.  Pulmonary:     Effort: Pulmonary effort is normal. No accessory muscle usage or respiratory distress.     Breath sounds: Normal breath sounds.  Musculoskeletal:     Cervical back: Normal range of motion.     Right lower leg: 2+ Edema present.     Left lower leg: 2+ Edema present.  Skin:    Findings: Bruising (mild noted to upper extremities.) present.     Comments: Juxtalite on bilateral lower extremities.  Neurological:     Mental Status: She is alert and oriented to person, place, and time.  Psychiatric:        Attention and Perception: Attention normal.        Mood and Affect: Mood normal.        Behavior: Behavior normal. Behavior is cooperative.        Thought Content: Thought content normal.        Judgment: Judgment normal.    Results for orders placed or performed in visit on 11/04/21  CUP PACEART REMOTE DEVICE CHECK  Result Value Ref Range   Date Time Interrogation Session [id number]    Pulse Generator Manufacturer MERM    Pulse Gen Model ZSW10 Reveal LINQ    Pulse Gen Serial Number [id number]    Cl[facility]    Implantable Pulse Generator Type ICM/ILR    Implantable Pulse Generator Implant Date [id number]       Assessment & Plan:   Problem List Items Addressed This Visit       Cardiovascular and Mediastinum   Hypotension    Post infection, improved at this time with no symptoms.  Continue good fluid  intake at home and monitor.        Other   Cellulitis of left lower extremity - Primary    Followed by dermatology at this time -- continue this collaboration.  CBC today.      Relevant Orders   CBC with Differential/Platelet   Lymphedema    Chronic, ongoing.  Continue use of  compression hose daily.  Continue collaboration with vascular and dermatology team.  Appreciate their input.  Will place new referral for massage therapy locally, as is in need of this.      Relevant Orders   Ambulatory referral to Physical Therapy   Obesity    BMI 31.43.  Recommended eating smaller high protein, low fat meals more frequently and exercising 30 mins a day 5 times a week with a goal of 10-15lb weight loss in the next 3 months. Patient voiced their understanding and motivation to adhere to these recommendations.         Follow up plan: Return in about 5 weeks (around 01/02/2022) for T2DM, HTN/HLD, LYMPHEDEMA, COPD, B12.

## 2021-11-26 NOTE — Assessment & Plan Note (Signed)
Followed by dermatology at this time -- continue this collaboration.  CBC today.

## 2021-11-27 LAB — CBC WITH DIFFERENTIAL/PLATELET
Basophils Absolute: 0 10*3/uL (ref 0.0–0.2)
Basos: 0 %
EOS (ABSOLUTE): 0.2 10*3/uL (ref 0.0–0.4)
Eos: 3 %
Hematocrit: 41.1 % (ref 34.0–46.6)
Hemoglobin: 13.5 g/dL (ref 11.1–15.9)
Immature Grans (Abs): 0 10*3/uL (ref 0.0–0.1)
Immature Granulocytes: 0 %
Lymphocytes Absolute: 1.5 10*3/uL (ref 0.7–3.1)
Lymphs: 24 %
MCH: 29 pg (ref 26.6–33.0)
MCHC: 32.8 g/dL (ref 31.5–35.7)
MCV: 88 fL (ref 79–97)
Monocytes Absolute: 0.8 10*3/uL (ref 0.1–0.9)
Monocytes: 13 %
Neutrophils Absolute: 3.6 10*3/uL (ref 1.4–7.0)
Neutrophils: 60 %
Platelets: 232 10*3/uL (ref 150–450)
RBC: 4.66 x10E6/uL (ref 3.77–5.28)
RDW: 13.1 % (ref 11.7–15.4)
WBC: 6.1 10*3/uL (ref 3.4–10.8)

## 2021-11-27 NOTE — Telephone Encounter (Signed)
LVM for patient to call device clinic back regarding loop recorder at [facility]

## 2021-11-27 NOTE — Progress Notes (Signed)
Contacted via [place]!!  Your CBC now shows normal white blood cell count.  Fantastic.  Looks like infection may be on the way to getting better.:)  Any questions? Keep being stellar!!  Thank you for allowing me to participate in your care.  I appreciate you. Kindest regards, Josey Dettmann

## 2021-11-28 NOTE — Telephone Encounter (Signed)
I spoke with the patient and she will like for her loop to be removed at her appointment with Caryl Comes on 12/10/2021. I let her know that it can be removed in-office. Caryl Comes will use lidocaine to numb the area then make a small incision to remove it. The patient also would like to know if she should hold any medication prior to the procedure? I told her I will have Dr. Caryl Comes nurse give her a call.

## 2021-11-29 ENCOUNTER — Other Ambulatory Visit: Payer: Self-pay | Admitting: Nurse Practitioner

## 2021-11-29 NOTE — Telephone Encounter (Signed)
Refusing due to dose inconsistent with current med list. Requested Prescriptions  Pending Prescriptions Disp Refills   furosemide (LASIX) 20 MG tablet [Pharmacy Med Name: FUROSEMIDE 20MG  TABLETS] 30 tablet     Sig: TAKE 1/2 TABLET(10 MG) BY MOUTH DAILY     Cardiovascular:  Diuretics - Loop Passed - 11/29/2021  9:39 AM      Passed - K in normal range and within 180 days    Potassium  Date Value Ref Range Status  10/15/2021 4.3 3.5 - 5.2 mmol/L Final          Passed - Ca in normal range and within 180 days    Calcium  Date Value Ref Range Status  10/15/2021 9.0 8.7 - 10.3 mg/dL Final          Passed - Na in normal range and within 180 days    Sodium  Date Value Ref Range Status  10/15/2021 140 134 - 144 mmol/L Final          Passed - Cr in normal range and within 180 days    Creatinine, Ser  Date Value Ref Range Status  10/15/2021 0.84 0.57 - 1.00 mg/dL Final          Passed - Cl in normal range and within 180 days    Chloride  Date Value Ref Range Status  10/15/2021 103 96 - 106 mmol/L Final          Passed - Mg Level in normal range and within 180 days    Magnesium  Date Value Ref Range Status  09/10/2021 2.1 1.6 - 2.3 mg/dL Final          Passed - Last BP in normal range    BP Readings from Last 1 Encounters:  11/26/21 126/77          Passed - Valid encounter within last 6 months    Recent Outpatient Visits           3 days ago Cellulitis of left lower extremity   [place], Henrine Screws T, NP   1 month ago Cellulitis of left lower extremity   [facility] [place], Megan P, DO   1 month ago Centrilobular emphysema ([place])   [place], [place] T, NP   2 months ago Cellulitis of left leg   [facility] [place], [place] T, NP   2 months ago Cellulitis of left leg   [facility] [name], NP       Future Appointments             In 3 weeks Brendolyn Patty, MD [place]   In 1 month [place], [name], NP [facility], [place]   In 4 months  [employer], [place]   In 5 months Dunn, Ryan M, PA-C [place], [facility]

## 2021-11-29 NOTE — Telephone Encounter (Signed)
dose inconsistent with current med list. Requested Prescriptions  Pending Prescriptions Disp Refills   furosemide (LASIX) 20 MG tablet [Pharmacy Med Name: FUROSEMIDE 20MG  TABLETS] 30 tablet     Sig: TAKE 1/2 TABLET(10 MG) BY MOUTH DAILY     Cardiovascular:  Diuretics - Loop Passed - 11/29/2021  9:39 AM      Passed - K in normal range and within 180 days    Potassium  Date Value Ref Range Status  10/15/2021 4.3 3.5 - 5.2 mmol/L Final         Passed - Ca in normal range and within 180 days    Calcium  Date Value Ref Range Status  10/15/2021 9.0 8.7 - 10.3 mg/dL Final         Passed - Na in normal range and within 180 days    Sodium  Date Value Ref Range Status  10/15/2021 140 134 - 144 mmol/L Final         Passed - Cr in normal range and within 180 days    Creatinine, Ser  Date Value Ref Range Status  10/15/2021 0.84 0.57 - 1.00 mg/dL Final         Passed - Cl in normal range and within 180 days    Chloride  Date Value Ref Range Status  10/15/2021 103 96 - 106 mmol/L Final         Passed - Mg Level in normal range and within 180 days    Magnesium  Date Value Ref Range Status  09/10/2021 2.1 1.6 - 2.3 mg/dL Final         Passed - Last BP in normal range    BP Readings from Last 1 Encounters:  11/26/21 126/77         Passed - Valid encounter within last 6 months    Recent Outpatient Visits          3 days ago Cellulitis of left lower extremity   [place], Henrine Screws T, NP   1 month ago Cellulitis of left lower extremity   [facility] [place], Megan P, DO   1 month ago Centrilobular emphysema ([place])   [place], [place] T, NP   2 months ago Cellulitis of left leg   [facility] [place], [place] T, NP   2 months ago Cellulitis of left leg   [facility] [name], NP      Future Appointments            In 3 weeks Brendolyn Patty, MD [place]   In 1 month [place],  [name], NP [facility], [place]   In 4 months  [employer], [place]   In 5 months Dunn, Ryan M, PA-C [place], [facility]

## 2021-12-02 ENCOUNTER — Other Ambulatory Visit: Payer: Medicare Other

## 2021-12-03 ENCOUNTER — Telehealth: Payer: Self-pay | Admitting: Internal Medicine

## 2021-12-03 NOTE — Telephone Encounter (Signed)
I called and spoke with the patient. She was diagnosed with the flu on Monday 2/20, but had been sick all weekend.   She is scheduled for a ILR explant on 12/10/21, but would like to postpone this a little bit so as to be recovered from her illness.   She prefers a Thursday appointment if possible.  I have offered her Thursday 12/26/21 with Dr. Caryl Comes at 8:20 am or 11:40 am. She would like to come at 11:40 am.  The patient is aware her appointment has been rescheduled.  She was very appreciative of the call back.

## 2021-12-03 NOTE — Telephone Encounter (Signed)
Patient states she has a procedure next Tuesday to remove her loop device, but she has the flu. Please call to discuss rescheduling.

## 2021-12-06 ENCOUNTER — Telehealth (INDEPENDENT_AMBULATORY_CARE_PROVIDER_SITE_OTHER): Payer: Medicare Other | Admitting: Nurse Practitioner

## 2021-12-06 ENCOUNTER — Encounter: Payer: Self-pay | Admitting: Nurse Practitioner

## 2021-12-06 DIAGNOSIS — J441 Chronic obstructive pulmonary disease with (acute) exacerbation: Secondary | ICD-10-CM | POA: Diagnosis not present

## 2021-12-06 MED ORDER — GUAIFENESIN-CODEINE 100-10 MG/5ML PO SOLN: 5.0000 mL | Freq: Two times a day (BID) | ORAL | 0 refills | Status: DC | PRN

## 2021-12-06 MED ORDER — PREDNISONE 20 MG PO TABS: 40.0000 mg | ORAL_TABLET | Freq: Every day | ORAL | 0 refills | Status: AC

## 2021-12-06 MED ORDER — AMOXICILLIN-POT CLAVULANATE 875-125 MG PO TABS: 1.0000 | ORAL_TABLET | Freq: Two times a day (BID) | ORAL | 0 refills | Status: DC

## 2021-12-06 NOTE — Addendum Note (Signed)
Addended by: [name] T on: 12/06/2021 12:51 PM   Modules accepted: Orders

## 2021-12-06 NOTE — Assessment & Plan Note (Signed)
Acute for one week with no improvement with home treatment.  Covid negative x 3 at home. At this time will send in Augmentin for 7 days, Prednisone 40 MG daily x 5 days, and cough syrup.  Recommend: - Increased rest - Increasing Fluids - Acetaminophen as needed for fever/pain.  - Salt water gargling, chloraseptic spray and throat lozenges - Diabetic tussin or Coricidin - Saline sinus flushes or a neti pot.  - Humidifying the air. Return in one week for lung check.

## 2021-12-06 NOTE — Patient Instructions (Signed)

## 2021-12-06 NOTE — Progress Notes (Signed)
LMP  (LMP Unknown)    Subjective:    Patient ID: Sharon [name], female    DOB: [birth year], 76 y.o.   MRN: [id number]  HPI: Sharon [name] is a 76 y.o. female  Chief Complaint  Patient presents with   Cough    Patient states she has had the Flu since last Friday. Patient states she was having some phlegm that was clear and she says now it is green. Patient states she has tried medications. Patient denies having a high grade, but she had a low grade fever within the past couple of days. Patient says she is just coughing and causing her not to get any rest. Patient states she has taken 3 at-home covid test since Friday and they were all negative.    This visit was completed via telephone due to the restrictions of the COVID-19 pandemic. All issues as above were discussed and addressed but no physical exam was performed. If it was felt that the patient should be evaluated in the office, they were directed there. The patient verbally consented to this visit. Patient was unable to complete an audio/visual visit due to Technical difficulties, Lack of internet. Due to the catastrophic nature of the COVID-19 pandemic, this visit was done through audio contact only. Location of the patient: home Location of the provider: work Those involved with this call:  Provider: [name], DNP CMA: Irena Reichmann, [place] Desk/Registration: [employer]  Time spent on call:  21 minutes on the phone discussing health concerns. 15 minutes total spent in review of patient's record and preparation of their chart.  I verified patient identity using two factors (patient name and date of birth). Patient consents verbally to being seen via telemedicine visit today.    UPPER RESPIRATORY TRACT INFECTION Started about one week ago with cough and congestion.  Has had negative Covid tests at home x 3.  Is Covid vaccinated.  Has underlying COPD. Worst symptom: cough Fever: yes = 99.7 yesterday Cough: yes =  productive with green phlegm Shortness of breath:  occasional Wheezing: no Chest pain: no Chest tightness: yes Chest congestion: yes Nasal congestion: yes Runny nose: yes Post nasal drip: yes Sneezing: no Sore throat: no Swollen glands: no Sinus pressure: yes Headache: no Face pain: no Toothache: no Ear pain: none Ear pressure: none Eyes red/itching:no Eye drainage/crusting: no  Vomiting: no Rash: no Fatigue: yes Sick contacts: yes Strep contacts: no  Context: worse Recurrent sinusitis: no Relief with OTC cold/cough medications: yes  Treatments attempted:  Diabetic Tussin  and cough syrup    Relevant past medical, surgical, family and social history reviewed and updated as indicated. Interim medical history since our last visit reviewed. Allergies and medications reviewed and updated.  Review of Systems  Constitutional:  Positive for fatigue and fever. Negative for activity change, appetite change and chills.  HENT:  Positive for congestion, postnasal drip, rhinorrhea and sinus pressure. Negative for ear discharge, ear pain, facial swelling, sinus pain, sneezing, sore throat and voice change.   Eyes:  Negative for pain and visual disturbance.  Respiratory:  Positive for cough and chest tightness. Negative for shortness of breath and wheezing.   Cardiovascular:  Negative for chest pain, palpitations and leg swelling.  Gastrointestinal: Negative.   Endocrine: Negative.   Musculoskeletal:  Negative for myalgias.  Neurological: Negative.   Psychiatric/Behavioral: Negative.     Per HPI unless specifically indicated above     Objective:    LMP  (LMP Unknown)  Wt Readings from Last 3 Encounters:  11/26/21 212 lb 12.8 oz (96.5 kg)  11/08/21 210 lb (95.3 kg)  10/22/21 211 lb (95.7 kg)    Physical Exam  Unable to perform, telephone visit only, does sound very congested.  Results for orders placed or performed in visit on 11/26/21  CBC with Differential/Platelet   Result Value Ref Range   WBC 6.1 3.4 - 10.8 x10E3/uL   RBC 4.66 3.77 - 5.28 x10E6/uL   Hemoglobin 13.5 11.1 - 15.9 g/dL   Hematocrit 41.1 34.0 - 46.6 %   MCV 88 79 - 97 fL   MCH 29.0 26.6 - 33.0 pg   MCHC 32.8 31.5 - 35.7 g/dL   RDW 13.1 11.7 - 15.4 %   Platelets 232 150 - 450 x10E3/uL   Neutrophils 60 Not Estab. %   Lymphs 24 Not Estab. %   Monocytes 13 Not Estab. %   Eos 3 Not Estab. %   Basos 0 Not Estab. %   Neutrophils Absolute 3.6 1.4 - 7.0 x10E3/uL   Lymphocytes Absolute 1.5 0.7 - 3.1 x10E3/uL   Monocytes Absolute 0.8 0.1 - 0.9 x10E3/uL   EOS (ABSOLUTE) 0.2 0.0 - 0.4 x10E3/uL   Basophils Absolute 0.0 0.0 - 0.2 x10E3/uL   Immature Granulocytes 0 Not Estab. %   Immature Grans (Abs) 0.0 0.0 - 0.1 x10E3/uL      Assessment & Plan:   Problem List Items Addressed This Visit       Respiratory   COPD exacerbation ([place])    Acute for one week with no improvement with home treatment.  Covid negative x 3 at home. At this time will send in Augmentin for 7 days, Prednisone 40 MG daily x 5 days, and cough syrup.  Recommend: - Increased rest - Increasing Fluids - Acetaminophen as needed for fever/pain.  - Salt water gargling, chloraseptic spray and throat lozenges - Diabetic tussin or Coricidin - Saline sinus flushes or a neti pot.  - Humidifying the air. Return in one week for lung check.      Relevant Medications   guaiFENesin-codeine 100-10 MG/5ML syrup    I discussed the assessment and treatment plan with the patient. The patient was provided an opportunity to ask questions and all were answered. The patient agreed with the plan and demonstrated an understanding of the instructions.   The patient was advised to call back or seek an in-person evaluation if the symptoms worsen or if the condition fails to improve as anticipated.   I provided 21+ minutes of time during this encounter.   Follow up plan: Return in about 1 week (around 12/13/2021) for Cough.

## 2021-12-09 NOTE — Progress Notes (Signed)
PT states she will call back to schedule an appt.

## 2021-12-10 ENCOUNTER — Encounter: Payer: Medicare Other | Admitting: Internal Medicine

## 2021-12-13 ENCOUNTER — Encounter: Payer: Self-pay | Admitting: Nurse Practitioner

## 2021-12-13 ENCOUNTER — Telehealth: Payer: Self-pay | Admitting: Nurse Practitioner

## 2021-12-13 ENCOUNTER — Telehealth (INDEPENDENT_AMBULATORY_CARE_PROVIDER_SITE_OTHER): Payer: Medicare Other | Admitting: Nurse Practitioner

## 2021-12-13 DIAGNOSIS — J441 Chronic obstructive pulmonary disease with (acute) exacerbation: Secondary | ICD-10-CM

## 2021-12-13 MED ORDER — AZITHROMYCIN 250 MG PO TABS: ORAL_TABLET | ORAL | 0 refills | Status: AC

## 2021-12-13 NOTE — Progress Notes (Signed)
? ?LMP  (LMP Unknown)   ? ?Subjective:  ? ? Patient ID: Sharon [name], female    DOB: 05-Jan-1946, 76 y.o.   MRN: [id number] ? ?HPI: ?Sharon [name] is a 76 y.o. female ? ?Chief Complaint  ?Patient presents with  ? Cough  ? Head Congestion  ?  Patient states she is feeling a little bit better. Patient states she is still having some head congestion and coughing. Patient states her phlegm still has a yellowish tint to it. Patient states she has completed her courses of treatment. Patient states the cough medicine isn't working.   ? ?This visit was completed via telephone due to the restrictions of the COVID-19 pandemic. All issues as above were discussed and addressed but no physical exam was performed. If it was felt that the patient should be evaluated in the office, they were directed there. The patient verbally consented to this visit. Patient was unable to complete an audio/visual visit due to Technical difficulties?, ?Lack of internet. Due to the catastrophic nature of the COVID-19 pandemic, this visit was done through audio contact only. ?Location of the patient: home ?Location of the provider: work ?Those involved with this call:  ?Provider: [name], DNP ?CMA: Irena Reichmann, CMA ?Front Desk/Registration: [employer]  ?Time spent on call:  21 minutes on the phone discussing health concerns. 15 minutes total spent in review of patient's record and preparation of their chart.  ?I verified patient identity using two factors (patient name and date of birth). Patient consents verbally to being seen via telemedicine visit today.   ? ?UPPER RESPIRATORY TRACT INFECTION ?Started around 11/29/21 with cough and congestion.  Has had negative Covid tests at home x 3.  Is Covid vaccinated.  Has underlying COPD.  Treated with Augmentin and Prednisone on 12/06/21 + cough syrup, but report this did not help 100% and is still not feeling great.  Went a period of time with laryngitis recently.    ?Worst symptom:  cough ?Fever: none ?Cough: yes = ongoing ?Shortness of breath:  occasional ?Wheezing: no ?Chest pain: no ?Chest tightness: yes ?Chest congestion: yes ?Nasal congestion: yes ?Runny nose: yes ?Post nasal drip: yes ?Sneezing: no ?Sore throat: no ?Swollen glands: no ?Sinus pressure: yes ?Headache: no ?Face pain: no ?Toothache: no ?Ear pain: none ?Ear pressure: none ?Eyes red/itching:no ?Eye drainage/crusting: no  ?Vomiting: no ?Rash: no ?Fatigue: yes ?Sick contacts: yes ?Strep contacts: no  ?Context: worse ?Recurrent sinusitis: no ?Relief with OTC cold/cough medications: yes  ?Treatments attempted:  Diabetic Tussin  and cough syrup   ? ?Relevant past medical, surgical, family and social history reviewed and updated as indicated. Interim medical history since our last visit reviewed. ?Allergies and medications reviewed and updated. ? ?Review of Systems  ?Constitutional:  Positive for fatigue. Negative for activity change, appetite change, chills and fever.  ?HENT:  Positive for congestion, postnasal drip and rhinorrhea. Negative for ear discharge, ear pain, facial swelling, sinus pressure, sinus pain, sneezing, sore throat and voice change.   ?Eyes:  Negative for pain and visual disturbance.  ?Respiratory:  Positive for cough and chest tightness. Negative for shortness of breath and wheezing.   ?Cardiovascular:  Negative for chest pain, palpitations and leg swelling.  ?Gastrointestinal: Negative.   ?Endocrine: Negative.   ?Musculoskeletal:  Negative for myalgias.  ?Neurological: Negative.   ?Psychiatric/Behavioral: Negative.    ? ?Per HPI unless specifically indicated above ? ?   ?Objective:  ?  ?LMP  (LMP Unknown)   ?Wt Readings  from Last 3 Encounters:  ?11/26/21 212 lb 12.8 oz (96.5 kg)  ?11/08/21 210 lb (95.3 kg)  ?10/22/21 211 lb (95.7 kg)  ?  ?Physical Exam ? ?Unable to perform due to telephone visit only. ? ?Results for orders placed or performed in visit on 11/26/21  ?CBC with Differential/Platelet  ?Result Value  Ref Range  ? WBC 6.1 3.4 - 10.8 x10E3/uL  ? RBC 4.66 3.77 - 5.28 x10E6/uL  ? Hemoglobin 13.5 11.1 - 15.9 g/dL  ? Hematocrit 41.1 34.0 - 46.6 %  ? MCV 88 79 - 97 fL  ? MCH 29.0 26.6 - 33.0 pg  ? MCHC 32.8 31.5 - 35.7 g/dL  ? RDW 13.1 11.7 - 15.4 %  ? Platelets 232 150 - 450 x10E3/uL  ? Neutrophils 60 Not Estab. %  ? Lymphs 24 Not Estab. %  ? Monocytes 13 Not Estab. %  ? Eos 3 Not Estab. %  ? Basos 0 Not Estab. %  ? Neutrophils Absolute 3.6 1.4 - 7.0 x10E3/uL  ? Lymphocytes Absolute 1.5 0.7 - 3.1 x10E3/uL  ? Monocytes Absolute 0.8 0.1 - 0.9 x10E3/uL  ? EOS (ABSOLUTE) 0.2 0.0 - 0.4 x10E3/uL  ? Basophils Absolute 0.0 0.0 - 0.2 x10E3/uL  ? Immature Granulocytes 0 Not Estab. %  ? Immature Grans (Abs) 0.0 0.0 - 0.1 x10E3/uL  ? ?   ?Assessment & Plan:  ? ?Problem List Items Addressed This Visit   ? ?  ? Respiratory  ? COPD exacerbation ([place]) - Primary  ?  Acute and ongoing with minimal improvement with Augmentin and Prednisone.  Covid negative x 3 at home. At this time will obtain imaging to further assess and start Azithromycin -- continue OTC cough syrup at home.   ?- Increased rest ?- Increasing Fluids ?- Acetaminophen as needed for fever/pain.  ?- Salt water gargling, chloraseptic spray and throat lozenges ?- Diabetic tussin or Coricidin ?- Saline sinus flushes or a neti pot.  ?- Humidifying the air. ?Return in one week for lung check. ?  ?  ? Relevant Medications  ? azithromycin (ZITHROMAX) 250 MG tablet  ? Other Relevant Orders  ? DG Chest 2 View  ?  ?I discussed the assessment and treatment plan with the patient. The patient was provided an opportunity to ask questions and all were answered. The patient agreed with the plan and demonstrated an understanding of the instructions. ? ? ?The patient was advised to call back or seek an in-person evaluation if the symptoms worsen or if the condition fails to improve as anticipated. ? ? ?I provided 21+ minutes of time during this encounter.  ? ?Follow up plan: ?Return in  about 1 week (around 12/20/2021) for COPD exacerbation. ? ? ? ? ? ?

## 2021-12-13 NOTE — Telephone Encounter (Signed)
Copied from [place] [id number]. Topic: General - Other ?>> Dec 13, 2021 12:35 PM Leward Quan A wrote: ?Reason for CRM: Olivia Mackie from [facility] called in to inform [name] that they are unable to see patient at any of their locations because they do not have any massage therapist there plus no one there specialize in lymphedema massages. Please be advised ?

## 2021-12-13 NOTE — Patient Instructions (Signed)

## 2021-12-13 NOTE — Telephone Encounter (Signed)
Noted  

## 2021-12-13 NOTE — Assessment & Plan Note (Signed)
Acute and ongoing with minimal improvement with Augmentin and Prednisone.  Covid negative x 3 at home. At this time will obtain imaging to further assess and start Azithromycin -- continue OTC cough syrup at home.   ?- Increased rest ?- Increasing Fluids ?- Acetaminophen as needed for fever/pain.  ?- Salt water gargling, chloraseptic spray and throat lozenges ?- Diabetic tussin or Coricidin ?- Saline sinus flushes or a neti pot.  ?- Humidifying the air. ?Return in one week for lung check. ?

## 2021-12-16 NOTE — Progress Notes (Signed)
PT has an appointment on 3/21 and this falls within the 1-2 weeks. Pt prefers to just keep this appointment. ?

## 2021-12-17 ENCOUNTER — Other Ambulatory Visit: Payer: Self-pay

## 2021-12-17 ENCOUNTER — Ambulatory Visit
Admission: RE | Admit: 2021-12-17 | Discharge: 2021-12-17 | Disposition: A | Discharge status: Home / Self Care | Payer: Medicare Other | Attending: Nurse Practitioner | Admitting: Nurse Practitioner

## 2021-12-17 ENCOUNTER — Ambulatory Visit
Admission: RE | Admit: 2021-12-17 | Discharge: 2021-12-17 | Disposition: A | Discharge status: Home / Self Care | Payer: Medicare Other | Source: Ambulatory Visit | Attending: Nurse Practitioner | Admitting: Nurse Practitioner

## 2021-12-17 DIAGNOSIS — J441 Chronic obstructive pulmonary disease with (acute) exacerbation: Secondary | ICD-10-CM | POA: Insufficient documentation

## 2021-12-17 DIAGNOSIS — R059 Cough, unspecified: Secondary | ICD-10-CM | POA: Diagnosis not present

## 2021-12-18 NOTE — Progress Notes (Signed)
Contacted via [place] ? ? ?Good morning Konstantina, I looked at imaging and report and both show no pneumonia.  Most likely an exacerbation of your COPD.  Continue current treatment ordered and we will see how you are feeling next week.  If any worsening let me know:) ?Keep being amazing!!  Thank you for allowing me to participate in your care.  I appreciate you. ?Kindest regards, ?Foye Haggart ?

## 2021-12-19 ENCOUNTER — Other Ambulatory Visit: Payer: Self-pay

## 2021-12-19 ENCOUNTER — Ambulatory Visit (INDEPENDENT_AMBULATORY_CARE_PROVIDER_SITE_OTHER): Payer: Medicare Other

## 2021-12-19 DIAGNOSIS — Z6831 Body mass index (BMI) 31.0-31.9, adult: Secondary | ICD-10-CM | POA: Diagnosis not present

## 2021-12-19 DIAGNOSIS — E538 Deficiency of other specified B group vitamins: Secondary | ICD-10-CM

## 2021-12-19 DIAGNOSIS — E6609 Other obesity due to excess calories: Secondary | ICD-10-CM | POA: Diagnosis not present

## 2021-12-19 MED ORDER — CYANOCOBALAMIN 1000 MCG/ML IJ SOLN
1000.0000 ug | Freq: Once | INTRAMUSCULAR | Status: AC
  Administered 2021-12-19: 1000 ug via INTRAMUSCULAR

## 2021-12-22 ENCOUNTER — Other Ambulatory Visit: Payer: Self-pay | Admitting: Nurse Practitioner

## 2021-12-23 NOTE — Telephone Encounter (Signed)
Requested Prescriptions  ?Pending Prescriptions Disp Refills  ?? clopidogrel (PLAVIX) 75 MG tablet [Pharmacy Med Name: CLOPIDOGREL '75MG'$  TABLETS] 90 tablet 2  ?  Sig: TAKE 1 TABLET(75 MG) BY MOUTH DAILY  ?  ? Hematology: Antiplatelets - clopidogrel Passed - 12/22/2021  6:21 AM  ?  ?  Passed - HCT in normal range and within 180 days  ?  Hematocrit  ?Date Value Ref Range Status  ?11/26/2021 41.1 34.0 - 46.6 % Final  ?   ?  ?  Passed - HGB in normal range and within 180 days  ?  Hemoglobin  ?Date Value Ref Range Status  ?11/26/2021 13.5 11.1 - 15.9 g/dL Final  ?   ?  ?  Passed - PLT in normal range and within 180 days  ?  Platelets  ?Date Value Ref Range Status  ?11/26/2021 232 150 - 450 x10E3/uL Final  ?   ?  ?  Passed - Cr in normal range and within 360 days  ?  Creatinine, Ser  ?Date Value Ref Range Status  ?10/15/2021 0.84 0.57 - 1.00 mg/dL Final  ?   ?  ?  Passed - Valid encounter within last 6 months  ?  Recent Outpatient Visits   ?      ? 1 week ago COPD exacerbation ([place])  ? [place], [place] T, NP  ? 2 weeks ago COPD exacerbation ([place])  ? [place], Elsie T, NP  ? 3 weeks ago Cellulitis of left lower extremity  ? [facility] [place], [place] T, NP  ? 2 months ago Cellulitis of left lower extremity  ? [place], [place] P, DO  ? 2 months ago Centrilobular emphysema ([place])  ? [facility] [place], Henrine Screws T, NP  ?  ?  ?Future Appointments   ?        ? Tomorrow Brendolyn Patty, MD [place]  ? In 1 week [name], NP [employer], [facility]  ? In 3 months  [facility]  ? In 4 months [place], Areta Haber, PA-C [facility]  ?  ? ?  ?  ?  ? ?

## 2021-12-24 ENCOUNTER — Other Ambulatory Visit: Payer: Self-pay

## 2021-12-24 ENCOUNTER — Ambulatory Visit (INDEPENDENT_AMBULATORY_CARE_PROVIDER_SITE_OTHER): Payer: Medicare Other | Admitting: Dermatology

## 2021-12-24 DIAGNOSIS — I872 Venous insufficiency (chronic) (peripheral): Secondary | ICD-10-CM | POA: Diagnosis not present

## 2021-12-24 NOTE — Progress Notes (Signed)
? ?  Follow-Up Visit ?  ?Subjective  ?Sharon [name] is a 76 y.o. female who presents for the following: Follow-up. ? ?Patient here for 1 month follow-up Stasis Dermatitis of the left pretibia. Some improvement with desoximetasone cream qd, tacrolimus ointment qd, CeraVe Itch-Relief, CeraVe SA. She continues to wear Juxtalite compression daily and uses lymphedema pumps throughout the day. ? ?The following portions of the chart were reviewed this encounter and updated as appropriate:  ?  ?  ? ?Review of Systems:  No other skin or systemic complaints except as noted in HPI or Assessment and Plan. ? ?Objective  ?Well appearing patient in no apparent distress; mood and affect are within normal limits. ? ?A focused examination was performed including left lower leg. Relevant physical exam findings are noted in the Assessment and Plan. ? ?Left Lower Leg ?Pitting edema; erythema of the left pretibia; peau d'orange textural change ? ? ? ?Assessment & Plan  ?Venous stasis dermatitis of left lower extremity ?Left Lower Leg ? ?Chronic and persistent condition with duration or expected duration over one year. Condition is symptomatic/ bothersome to patient. Stable with some improvement. Not currently at goal. ? ?Stasis in the legs causes chronic leg swelling, which may result in itchy or painful rashes, skin discoloration, skin texture changes, and sometimes ulceration.  Recommend daily graduated compression hose/stockings- easiest to put on first thing in morning, remove at bedtime.  Elevate legs as much as possible. Avoid salt/sodium rich foods. ? ?Decrease topicals alternating nightly ?Tacrolimus 0.1% oint qohs to aa L lower leg  ?Desoximetasone 0.25% cream qohs to aa L lower leg. Avoid face, groin, axilla. ? ?Cont Juxtalite compression garment qd ?Cont Lymphedema pump several times a day ?Cont CeraVe Itch-Relief and/or CeraVe SA ? ?Topical steroids (such as triamcinolone, fluocinolone, fluocinonide, mometasone, clobetasol,  halobetasol, betamethasone, hydrocortisone) can cause thinning and lightening of the skin if they are used for too long in the same area. Your physician has selected the right strength medicine for your problem and area affected on the body. Please use your medication only as directed by your physician to prevent side effects.  ? ? ?Related Medications ?desoximetasone (TOPICORT) 0.05 % cream ?Apply topically 2 (two) times daily. ? ? ?Return in about 2 months (around 02/23/2022) for Stasis Dermatitis. ? ?Documentation: I have reviewed the above documentation for accuracy and completeness, and I agree with the above. ? ?Brendolyn Patty MD  ? ? ?

## 2021-12-24 NOTE — Patient Instructions (Addendum)
Desoximetasone 0.05% cream - use every other night to affected area leg, alternating with Tacrolimus ointment. ?Continue CeraVe Itch-Relief and CeraVe SA ? ?Stasis in the legs causes chronic leg swelling, which may result in itchy or painful rashes, skin discoloration, skin texture changes, and sometimes ulceration.  Recommend daily graduated compression hose/stockings- easiest to put on first thing in morning, remove at bedtime.  Elevate legs as much as possible. Avoid salt/sodium rich foods. ? ?Topical steroids (such as triamcinolone, fluocinolone, fluocinonide, mometasone, clobetasol, halobetasol, betamethasone, hydrocortisone) can cause thinning and lightening of the skin if they are used for too long in the same area. Your physician has selected the right strength medicine for your problem and area affected on the body. Please use your medication only as directed by your physician to prevent side effects.  ? ? ? ? ?If You Need Anything After Your Visit ? ?If you have any questions or concerns for your doctor, please call our main line at [phone] and press option 4 to reach your doctor's medical assistant. If no one answers, please leave a voicemail as directed and we will return your call as soon as possible. Messages left after 4 pm will be answered the following business day.  ? ?You may also send [place] a message via MyChart. We typically respond to MyChart messages within 1-2 business days. ? ?For prescription refills, please ask your pharmacy to contact our office. Our fax number is [phone]. ? ?If you have an urgent issue when the clinic is closed that cannot wait until the next business day, you can page your doctor at the number below.   ? ?Please note that while we do our best to be available for urgent issues outside of office hours, we are not available 24/7.  ? ?If you have an urgent issue and are unable to reach [place], you may choose to seek medical care at your doctor's office, retail clinic,  urgent care center, or emergency room. ? ?If you have a medical emergency, please immediately call 911 or go to the emergency department. ? ?Pager Numbers ? ?- Dr. Nehemiah Massed: [phone] ? ?- Dr. Laurence Ferrari: [phone] ? ?- Dr. Nicole Kindred: [phone] ? ?In the event of inclement weather, please call our main line at [phone] for an update on the status of any delays or closures. ? ?Dermatology Medication Tips: ?Please keep the boxes that topical medications come in in order to help keep track of the instructions about where and how to use these. Pharmacies typically print the medication instructions only on the boxes and not directly on the medication tubes.  ? ?If your medication is too expensive, please contact our office at [phone] option 4 or send [place] a message through [place].  ? ?We are unable to tell what your co-pay for medications will be in advance as this is different depending on your insurance coverage. However, we may be able to find a substitute medication at lower cost or fill out paperwork to get insurance to cover a needed medication.  ? ?If a prior authorization is required to get your medication covered by your insurance company, please allow [place] 1-2 business days to complete this process. ? ?Drug prices often vary depending on where the prescription is filled and some pharmacies may offer cheaper prices. ? ?The website www.goodrx.com contains coupons for medications through different pharmacies. The prices here do not account for what the cost may be with help from insurance (it may be cheaper with your insurance), but the website can  give you the price if you did not use any insurance.  ?- You can print the associated coupon and take it with your prescription to the pharmacy.  ?- You may also stop by our office during regular business hours and pick up a GoodRx coupon card.  ?- If you need your prescription sent electronically to a different pharmacy, notify our office through [facility] or by phone at [phone] option 4. ? ? ? ? ?Si Usted Necesita Algo Despu?s de Su Visita ? ?Tambi?n puede enviarnos un mensaje a trav?s de MyChart. Por lo general respondemos a los mensajes de MyChart en el transcurso de 1 a 2 d?as h?biles. ? ?Para renovar recetas, por favor pida a su farmacia que se ponga en contacto con nuestra oficina. Nuestro n?mero de fax es el [phone]. ? ?Si tiene un asunto urgente cuando la cl?nica est? cerrada y que no puede esperar hasta el siguiente d?a h?bil, puede llamar/localizar a su doctor(a) al n?mero que aparece a continuaci?n.  ? ?Por favor, tenga en cuenta que aunque hacemos todo lo posible para estar disponibles para asuntos urgentes fuera del horario de oficina, no estamos disponibles las 24 horas del d?a, los 7 d?as de la semana.  ? ?Si tiene un problema urgente y no puede comunicarse con nosotros, puede optar por buscar atenci?n m?dica  en el consultorio de su doctor(a), en una cl?nica privada, en un centro de atenci?n urgente o en una sala de emergencias. ? ?Si tiene Engineer, maintenance (IT) m?dica, por favor llame inmediatamente al 911 o vaya a la sala de emergencias. ? ?N?meros de b?per ? ?- Dr. Nehemiah Massed: [phone] ? ?- Dra. Moye: [phone] ? ?- Dra. Nicole Kindred: [phone] ? ?En caso de inclemencias del tiempo, por favor llame a nuestra l?nea principal al [phone] para una actualizaci?n sobre el estado de cualquier retraso o cierre. ? ?Consejos para la medicaci?n en dermatolog?a: ?Por favor, guarde las cajas en las que vienen los medicamentos de uso t?pico para ayudarle a seguir las instrucciones sobre d?nde y c?mo usarlos. Las farmacias generalmente imprimen las instrucciones del medicamento s?lo en las cajas y no directamente en los tubos del [place].  ? ?Si su medicamento es muy caro, por favor, p?ngase en contacto con Zigmund Daniel llamando al [phone] y presione la opci?n 4 o env?enos un mensaje a trav?s de MyChart.  ? ?No podemos decirle cu?l  ser? su copago por los medicamentos por adelantado ya que esto es diferente dependiendo de la cobertura de su seguro. Sin embargo, es posible que podamos encontrar un medicamento sustituto a Electrical engineer un formulario para que el seguro cubra el medicamento que se considera necesario.  ? ?Si se requiere Ardelia Mems autorizaci?n previa para que su compa??a de seguros [place] su medicamento, por favor perm?tanos de 1 a 2 d?as h?biles para completar este proceso. ? ?Los precios de los medicamentos var?an con frecuencia dependiendo del Environmental consultant de d?nde se surte la receta y alguna farmacias pueden ofrecer precios m?s baratos. ? ?El sitio web www.goodrx.com tiene cupones para medicamentos de Airline pilot. Los precios aqu? no tienen en cuenta lo que podr?a costar con la ayuda del seguro (puede ser m?s barato con su seguro), pero el sitio web puede darle el precio si no utiliz? ning?n seguro.  ?- Puede imprimir el cup?n correspondiente y llevarlo con su receta a la farmacia.  ?- Tambi?n puede pasar por nuestra oficina durante el horario de atenci?n regular y recoger una tarjeta de cupones de GoodRx.  ?-  Si necesita que su receta se env?e electr?nicamente a Chiropodist, informe a nuestra oficina a trav?s de MyChart de [place] o por tel?fono llamando al [phone] y presione la opci?n 4. ? ?

## 2021-12-25 ENCOUNTER — Ambulatory Visit (INDEPENDENT_AMBULATORY_CARE_PROVIDER_SITE_OTHER): Payer: Medicare Other

## 2021-12-25 DIAGNOSIS — I5189 Other ill-defined heart diseases: Secondary | ICD-10-CM | POA: Diagnosis not present

## 2021-12-26 ENCOUNTER — Encounter: Payer: Self-pay | Admitting: Internal Medicine

## 2021-12-26 ENCOUNTER — Other Ambulatory Visit: Payer: Self-pay

## 2021-12-26 ENCOUNTER — Ambulatory Visit (INDEPENDENT_AMBULATORY_CARE_PROVIDER_SITE_OTHER): Payer: Medicare Other | Admitting: Internal Medicine

## 2021-12-26 VITALS — BP 130/78 | HR 85 | Ht 69.0 in | Wt 211.0 lb

## 2021-12-26 DIAGNOSIS — Z8673 Personal history of transient ischemic attack (TIA), and cerebral infarction without residual deficits: Secondary | ICD-10-CM | POA: Diagnosis not present

## 2021-12-26 DIAGNOSIS — Z959 Presence of cardiac and vascular implant and graft, unspecified: Secondary | ICD-10-CM

## 2021-12-26 LAB — ECHOCARDIOGRAM COMPLETE
AR max vel: 3.45 cm2
AV Area VTI: 3.81 cm2
AV Area mean vel: 3.39 cm2
AV Mean grad: 2 mmHg
AV Peak grad: 3.9 mmHg
Ao pk vel: 0.99 m/s
Area-P 1/2: 3.21 cm2
S' Lateral: 3.5 cm

## 2021-12-26 NOTE — Patient Instructions (Signed)
Medication Instructions:  ?- Your physician recommends that you continue on your current medications as directed. Please refer to the Current Medication list given to you today. ? ?*If you need a refill on your cardiac medications before your next appointment, please call your pharmacy* ? ? ?Lab Work: ?- none ordered ? ?If you have labs (blood work) drawn today and your tests are completely normal, you will receive your results only by: ?MyChart Message (if you have MyChart) OR ?A paper copy in the mail ?If you have any lab test that is abnormal or we need to change your treatment, we will call you to review the results. ? ? ?Testing/Procedures: ?- none ordered ? ? ?Follow-Up: ?At [facility], you and your health needs are our priority.  As part of our continuing mission to provide you with exceptional heart care, we have created designated Provider Care Teams.  These Care Teams include your primary Cardiologist (physician) and Advanced Practice Providers (APPs -  Physician Assistants and Nurse Practitioners) who all work together to provide you with the care you need, when you need it. ? ?We recommend signing up for the patient portal called "MyChart".  Sign up information is provided on this After Visit Summary.  MyChart is used to connect with patients for Virtual Visits (Telemedicine).  Patients are able to view lab/test results, encounter notes, upcoming appointments, etc.  Non-urgent messages can be sent to your provider as well.   ?To learn more about what you can do with MyChart, go to [email or web address].   ? ?Your next appointment:   ?As needed  ? ?The format for your next appointment:   ?In Person ? ?Provider:   ?Virl Axe, MD  ? ? ?Other Instructions ? ?Post Loop Recorder implant instructions: ? ?1) You may shower tomorrow night ?2) You may remove your tegaderm (top) dressing on Day 3 post procedure: Sunday 12/29/21 ?3) You may remove your steri strips on Day 6 post procedure if they have not  fallen off on their own: Wednesday 01/01/22 ?4) If you have any bleeding issues or concerns with your implant site after getting home, please call our [facility] directly at [phone]. ? ? ?

## 2021-12-26 NOTE — Progress Notes (Signed)
? ? ? ? ?Patient Care Team: ?[name], NP as PCP - General (Nurse Practitioner) ?Minna Merritts, MD as PCP - Cardiology (Cardiology) ?Deboraha Sprang, MD as PCP - Electrophysiology (Cardiology) ?Robert Bellow, MD as Consulting Physician (General Surgery) ?Kathrine Haddock, NP as Nurse Practitioner (Nurse Practitioner) ?Minna Merritts, MD as Consulting Physician (Cardiology) ?Greg Cutter, LCSW as Education officer, museum (Licensed Holiday representative) ?Vladimir Faster, [facility] (Inactive) as Pharmacist (Pharmacist) ? ? ?HPI ? ?Sharon [name] is a 76 y.o. female ?Seen for loop recorder removal, LINQ   here for removal ? ?Normal  LV function, most recently confirmed 1 3/23 ? ?Coronary artery calcifications treated with clopidogrel, but has been intolerant of statins and PCSK9 therapy ? ?The patient denies chest pain,  nocturnal dyspnea, orthopnea or peripheral edema.  There have been no palpitations, lightheadedness or syncope.  Some shortness of breath with moderate exertion but she is more limited by her knees and this is stable ? ?Records and Results Reviewed  ? ?Past Medical History:  ?Diagnosis Date  ? Anxiety   ? Arthritis   ? hands, upper back  ? Asthma   ? COPD (chronic obstructive pulmonary disease) ([place])   ? History of cervical cancer   ? Menopausal disorder   ? Osteoporosis   ? Pneumonia 1960  ? Spasm of abdominal muscles of right side   ? intermittent  ? Stroke [facility]) 2019  ? TMJ (dislocation of temporomandibular joint)   ? Wears dentures   ? partial lower  ? ? ?Past Surgical History:  ?Procedure Laterality Date  ? ABDOMINAL HYSTERECTOMY  1970's  ? bladder botox  2005  ? BLADDER SUSPENSION  2004  ? CATARACT EXTRACTION W/PHACO Right 03/09/2018  ? Procedure: CATARACT EXTRACTION PHACO AND INTRAOCULAR LENS PLACEMENT ([place]) right;  Surgeon: Eulogio Bear, MD;  Location: [place];  Service: Ophthalmology;  Laterality: Right;  CALL CELL 1ST  ? CATARACT EXTRACTION W/PHACO Left 04/19/2018  ?  Procedure: CATARACT EXTRACTION PHACO AND INTRAOCULAR LENS PLACEMENT ([place])  LEFT;  Surgeon: Eulogio Bear, MD;  Location: [place];  Service: Ophthalmology;  Laterality: Left;  ? COLONOSCOPY WITH PROPOFOL N/A 12/13/2015  ? Procedure: COLONOSCOPY WITH PROPOFOL;  Surgeon: Lucilla Lame, MD;  Location: [place];  Service: Endoscopy;  Laterality: N/A;  ? COLONOSCOPY WITH PROPOFOL N/A 03/14/2021  ? Procedure: COLONOSCOPY WITH PROPOFOL;  Surgeon: Lucilla Lame, MD;  Location: [place];  Service: Endoscopy;  Laterality: N/A;  diabetic  ? LOOP RECORDER INSERTION N/A 01/07/2018  ? Procedure: LOOP RECORDER INSERTION;  Surgeon: Deboraha Sprang, MD;  Location: [place] [facility];  Service: Cardiovascular;  Laterality: N/A;  ? POLYPECTOMY N/A 12/13/2015  ? Procedure: POLYPECTOMY;  Surgeon: Lucilla Lame, MD;  Location: [place];  Service: Endoscopy;  Laterality: N/A;  SIGMOID COLON POLYPS X  5  ? POLYPECTOMY N/A 03/14/2021  ? Procedure: POLYPECTOMY;  Surgeon: Lucilla Lame, MD;  Location: [place];  Service: Endoscopy;  Laterality: N/A;  ? SHOULDER ARTHROSCOPY W/ ROTATOR CUFF REPAIR Right 1998  ? TEE WITHOUT CARDIOVERSION N/A 01/06/2018  ? Procedure: TRANSESOPHAGEAL ECHOCARDIOGRAM (TE[place]);  Surgeon: Minna Merritts, MD;  Location: [facility];  Service: Cardiovascular;  Laterality: N/A;  ? TONSILLECTOMY AND ADENOIDECTOMY    ? ? ?Current Meds  ?Medication Sig  ? acetaminophen (TYLENOL) 500 MG tablet Take 500 mg by mouth every 6 (six) hours as needed.  ? albuterol (PROAIR HFA) 108 (90 Base) MCG/ACT inhaler  Inhale 2 puffs into the lungs every 6 (six) hours as needed for wheezing or shortness of breath.  ? Blood Glucose Monitoring Suppl (ONETOUCH VERIO) w/Device KIT Utilize to check blood sugar twice a day, fasting in morning with goal < 130 and then 2 hours after a meal with goal <180.  Document and bring to visits.  ? Cholecalciferol 1.25 MG (50000 UT) TABS Take 1 tablet by mouth once a  week.  ? citalopram (CELEXA) 20 MG tablet Take 1 tablet (20 mg total) by mouth as needed.  ? clopidogrel (PLAVIX) 75 MG tablet TAKE 1 TABLET(75 MG) BY MOUTH DAILY  ? Cyanocobalamin 1000 MCG/ML KIT Inject 1,000 mcg as directed every 30 (thirty) days.  ? desoximetasone (TOPICORT) 0.05 % cream Apply topically 2 (two) times daily.  ? glucose blood (ONETOUCH VERIO) test strip Utilize to check blood sugar twice a day, fasting in morning with goal < 130 and then 2 hours after a meal with goal <180.  Document and bring to visits.  ? Lancets (ONETOUCH ULTRASOFT) lancets Utilize to check blood sugar twice a day, fasting in morning with goal < 130 and then 2 hours after a meal with goal <180.  Document and bring to visits.  ? loratadine (CLARITIN) 10 MG tablet Take 1 tablet (10 mg total) by mouth daily.  ? pantoprazole (PROTONIX) 40 MG tablet TAKE 1 TABLET(40 MG) BY MOUTH DAILY  ? penicillin v potassium (VEETID) 250 MG tablet Take by mouth.  ? Tiotropium Bromide-Olodaterol 2.5-2.5 MCG/ACT AERS Inhale 2 puffs into the lungs daily.  ? ? ?Allergies  ?Allergen Reactions  ? Statins Other (See Comments)  ?  myalgia  ? Dermacerin [Aquaphilic]   ? Quinolones   ?  FLUOROQUINOLONES - Pt reports she was told to NEVER take these.  ? Baclofen Swelling  ? Bactrim [Sulfamethoxazole-Trimethoprim] Nausea And Vomiting  ? Ibuprofen Rash  ?  Mouth swelling  ? Latex Rash  ?  Some bandaids, some gloves; BLOOD TEST NEGATIVE  ? Librium [Chlordiazepoxide] Itching  ?  Dizziness ?  ? Naprosyn [Naproxen] Rash  ?  Mouth swelling  ? Other Rash  ?  [place] nuts - mouth swelling  ? ? ? ? ?Review of Systems negative except from HPI and PMH ? ?Physical Exam ?BP 130/78 (BP Location: Left Arm, Patient Position: Sitting, Cuff Size: Normal)   Pulse 85   Ht _0  (1.753 m)   Wt 211 lb (95.7 kg)   LMP  (LMP Unknown)   SpO2 99%   BMI 31.16 kg/m?  ?Well developed and well nourished in no acute distress ?HENT normal ?E scleral and icterus clear ?Neck Supple ?Clear  to ausculation ?Regular rate and rhythm, no murmurs gallops or rub ?Soft   ?No clubbing cyanosis  Edema ?Alert and oriented, grossly normal motor and sensory function ?Skin Warm and Dry ? ?ECG   ? ?CrCl cannot be calculated (Patient's most recent lab result is older than the maximum 21 days allowed.). ? ? ?Assessment and  Plan ?Cryptogenic stroke status post loop recorder insertion ? ?Coronary artery calcifications intolerant of statins and PCSK9 ? ? ?The patient has questions as to whether the loop recorder should be removed or not.  Many people keep them in; however, she acknowledges that she worries about a lot.  Given her young age, we have elected to remove it.  The big risks are bleeding and infection she understands and is willing to proceed ? ? ?LATORRIA ZEOLI ?[id number]  [id number] ? ?Preop  [id number] stroke with previously implanted loop recorder  ?Postop Dx same/loop recorder at end of service ? ?Procedure: Device explantation ? ?Local anesthesia was delivered at the proximal edge of her loop recorder.  An incision was made and the device was removed.  A benzoin Steri-Strip bandage was applied and covered with Tegaderm ? ?Cx: None ? ?Tegaderm to removed on Sunday and the Steri-Strips to come off as they will ?Instructions given as to pressure from wound bleeding ?  ? ? ?Virl Axe, MD ?12/26/2021 1:01 PM ? ? ? ?Current medicines are reviewed at length with the patient today .  The patient does not  have concerns regarding medicines. ? ?

## 2021-12-28 NOTE — Patient Instructions (Signed)
Lymphedema Lymphedema is swelling that is caused by the abnormal collection of lymph in the tissues under the skin. Lymph is excess fluid from the tissues in your body that is removed through the lymphatic system. This system is part of your body's defense system (immune system) and includes lymph nodes and lymph vessels. The lymph vessels collect and carry the excess fluid, fats, proteins, and waste from the tissues of the body to the bloodstream. This system also works to clean and remove bacteria and waste products from the body. Lymphedema occurs when the lymphatic system is blocked. When the lymph vessels or lymph nodes are blocked or damaged, lymph does not drain properly. This causes an abnormal buildup of lymph, which leads to swelling in the affected area. This may include the trunk area, or an arm or leg. Lymphedema cannot be cured by medicines, but various methods can be used to help reduce the swelling. What are the causes? The cause of this condition depends on the type of lymphedema that you have. Primary lymphedema is caused by the absence of lymph vessels or having abnormal lymph vessels at birth. Secondary lymphedema occurs when lymph vessels are blocked or damaged. Secondary lymphedema is more common. Common causes of lymph vessel blockage include: Skin infection, such as cellulitis. Infection by parasites (filariasis). Injury. Radiation therapy. Cancer. Formation of scar tissue. Surgery. What are the signs or symptoms? Symptoms of this condition include: Swelling of the arm or leg. A heavy or tight feeling in the arm or leg. Swelling of the feet, toes, or fingers. Shoes or rings may fit more tightly than before. Redness of the skin over the affected area. Limited movement of the affected limb. Sensitivity to touch or discomfort in the affected limb. How is this diagnosed? This condition may be diagnosed based on: Your symptoms and medical history. A physical  exam. Bioimpedance spectroscopy. In this test, painless electrical currents are used to measure fluid levels in your body. Imaging tests, such as: MRI. CT scan. Duplex ultrasound. This test uses sound waves to produce images of the vessels and the blood flow on a screen. Lymphoscintigraphy. In this test, a low dose of a radioactive substance is injected to trace the flow of lymph through your lymph vessels. Lymphangiography. In this test, a contrast dye is injected into the lymph vessel to help show blockages. How is this treated? If an underlying condition is causing the lymphedema, that condition will be treated. For example, antibiotic medicines may be used to treat an infection. Treatment for this condition will depend on the cause of your lymphedema. Treatment may include: Complete decongestive therapy (CDT). This is done by a certified lymphedema therapist to reduce fluid congestion. This therapy includes: Skin care. Compression wrapping of the affected area. Manual lymph drainage. This is a special massage technique that promotes lymph drainage out of a limb. Specific exercises. Certain exercises can help fluid move out of the affected limb. Compression. Various methods may be used to apply pressure to the affected limb to reduce the swelling. They include: Wearing compression stockings or sleeves on the affected limb. Wrapping the affected limb with special bandages. Surgery. This is usually done for severe cases only. For example, surgery may be done if you have trouble moving the limb or if the swelling does not get better with other treatments. Follow these instructions at home: Self-care The affected area is more likely to become injured or infected. Take these steps to help prevent infection: Keep the affected area clean   and dry. Use approved creams or lotions to keep the skin moisturized. Protect your skin from cuts: Use gloves while cooking or gardening. Do not walk  barefoot. If you shave the affected area, use an electric razor. Do not wear tight clothes, shoes, or jewelry. Eat a healthy diet that includes a lot of fruits and vegetables. Activity Do exercises as told by your health care provider. Do not sit with your legs crossed. When possible, keep the affected limb raised (elevated) above the level of your heart. Avoid carrying things with an arm that is affected by lymphedema. General instructions Wear compression stockings or sleeves as told by your health care provider. Note any changes in size of the affected limb. You may be instructed to take regular measurements and keep track of them. Take over-the-counter and prescription medicines only as told by your health care provider. If you were prescribed an antibiotic medicine, take or apply it as told by your health care provider. Do not stop using the antibiotic even if you start to feel better or if your condition improves. Do not use heating pads or ice packs on the affected area. Avoid having blood draws, IV insertions, or blood pressure checks on the affected limb. Keep all follow-up visits. This is important. Contact a health care provider if you: Continue to have swelling in your limb. Have fluid leaking from the skin of your swollen limb. Have a cut that does not heal. Have redness or pain in the affected area. Develop purplish spots, rash, blisters, or sores (lesions) on your affected limb. Get help right away if you: Have new swelling in your limb that starts suddenly. Have shortness of breath or chest pain. Have a fever or chills. These symptoms may represent a serious problem that is an emergency. Do not wait to see if the symptoms will go away. Get medical help right away. Call your local emergency services (911 in the [place] Do not drive yourself to the hospital. Summary Lymphedema is swelling that is caused by the abnormal collection of lymph in the tissues under the  skin. Lymph is fluid from the tissues in your body that is removed through the lymphatic system. This system collects and carries excess fluid, fats, proteins, and wastes from the tissues of the body to the bloodstream. Lymphedema causes swelling, pain, and redness in the affected area. This may include the trunk area, or an arm or leg. Treatment for this condition may depend on the cause of your lymphedema. Treatment may include treating the underlying cause, complete decongestive therapy (CDT), compression methods, or surgery. This information is not intended to replace advice given to you by your health care provider. Make sure you discuss any questions you have with your health care provider. Document Revised: 07/25/2020 Document Reviewed: 07/25/2020 Elsevier Patient Education  2022 Elsevier Inc.  

## 2021-12-30 ENCOUNTER — Telehealth: Payer: Self-pay

## 2021-12-30 NOTE — Progress Notes (Signed)
? ? ?Chronic Care Management ?Pharmacy Assistant  ? ?Name: Sharon [name]  MRN: [id number] DOB: 1946-04-04 ? ?Sharon [name] is an 76 y.o. year old female who presents for his initial CCM visit with the clinical pharmacist. ? ?Reason for Encounter: Disease State ?  ?Conditions to be addressed/monitored: ?DMII ? ? ?Recent office visits:  ?12/13/21 (Telemedicine) [place], Henrine Screws T, NP (Cough, head congestion)DG Chest 2 View, med changes: azithromycin (ZITHROMAX) 250 MG tablet ? ?12/06/21 (Telemedicine) [name] T, NP (Cough) No orders, med changes:guaiFENesin-codeine 100-10 MG/5ML syrup ? ?11/26/21 [name] T, NP (Hypotension,leg pain,med dose change,edema) Orders: CBC with Differential/Platelet and Ambulatory referral to Physical Therapy, no med changes ? ?10/15/21 [name] T, NP (Cough, cellulitis) Blood work ordered, No med changes ? ?09/26/21 [name] T, NP (Cellulitis of left leg) No orders or med changes  ? ?09/10/21 [name], NP (Annual Exam) blood work ordered, med changes:Tiotropium Bromide-Olodaterol 2.5-2.5 MCG/ACT AERS ? ?Recent consult visits:  ?12/24/21 Brendolyn Patty, MD-Dermatology (Venous stasis dermatitis) No orders or med changes ? ?11/25/21 Brendolyn Patty, MD-Dermatology (Stasis dermatitis) No orders or med changes ? ?11/12/21 Brendolyn Patty, MD-Dermatology (Stasis dermatitis) No orders or med changes ? ?11/08/21 Rise Mu, PA-C-Cardiology CVA) Orders: Echocardiogram complete, no med changes ? ?11/05/21 Brendolyn Patty, MD-Dermatology (Venous stasis dermatitis of lower extremity) No orders, med changes:cefdinir (OMNICEF) 300 MG capsule ? ?10/23/21 Brendolyn Patty, MD-Dermatology (Venous stasis dermatitis of lower extremity) Anaerobic and Aerobic culture ordered, med changes: desoximetasone (TOPICORT) 0.05 % cream and mupirocin ointment (BACTROBAN) 2 % ? ?10/22/21 Valerie Roys, DO-Family Medicine (Cellulitis) Labs ordered, med changes: cefdinir (OMNICEF) 300 MG  capsule ? ?07/15/21 Brendolyn Patty, MD-Dermatology (Stasis dermatitis of lower extremity) No orders, med changes: pimecrolimus (ELIDEL) 1 % cream ? ?Hospital visits:  ?Medication Reconciliation was completed by comparing discharge summary, patient?s EMR and Pharmacy list, and upon discussion with patient. ? ?Admitted to the hospital on 10/03/21 due to Cellulitis. Discharge date was 10/09/21. Discharged from [facility]   ? ?New?Medications Started at [facility] Discharge:?? ?-started prednisone  ? ?Medications Discontinued at Hospital Discharge: ?-Stopped ? Penicillin v potassium  ?Cephalexin 250 mg ?Tacrolimus 0.1% ? ?Medications that remain the same after Hospital %Discharge:??  ?-All other medications will remain the same.   ? ?Medications: ?Outpatient Encounter Medications as of 12/30/2021  ?Medication Sig  ? acetaminophen (TYLENOL) 500 MG tablet Take 500 mg by mouth every 6 (six) hours as needed.  ? albuterol (PROAIR HFA) 108 (90 Base) MCG/ACT inhaler Inhale 2 puffs into the lungs every 6 (six) hours as needed for wheezing or shortness of breath.  ? Blood Glucose Monitoring Suppl (ONETOUCH VERIO) w/Device KIT Utilize to check blood sugar twice a day, fasting in morning with goal < 130 and then 2 hours after a meal with goal <180.  Document and bring to visits.  ? Cholecalciferol 1.25 MG (50000 UT) TABS Take 1 tablet by mouth once a week.  ? citalopram (CELEXA) 20 MG tablet Take 1 tablet (20 mg total) by mouth as needed.  ? clopidogrel (PLAVIX) 75 MG tablet TAKE 1 TABLET(75 MG) BY MOUTH DAILY  ? Cyanocobalamin 1000 MCG/ML KIT Inject 1,000 mcg as directed every 30 (thirty) days.  ? desoximetasone (TOPICORT) 0.05 % cream Apply topically 2 (two) times daily.  ? furosemide (LASIX) 20 MG tablet Take 1 tablet (20 mg total) by mouth daily. (Patient not taking: Reported on 12/26/2021)  ? glucose blood (ONETOUCH VERIO) test strip Utilize to check blood sugar twice  a day, fasting in morning with goal < 130  and then 2 hours after a meal with goal <180.  Document and bring to visits.  ? Lancets (ONETOUCH ULTRASOFT) lancets Utilize to check blood sugar twice a day, fasting in morning with goal < 130 and then 2 hours after a meal with goal <180.  Document and bring to visits.  ? loratadine (CLARITIN) 10 MG tablet Take 1 tablet (10 mg total) by mouth daily.  ? metFORMIN (GLUCOPHAGE) 500 MG tablet Take 1 tablet (500 mg total) by mouth 2 (two) times daily with a meal. (Patient not taking: Reported on 12/26/2021)  ? pantoprazole (PROTONIX) 40 MG tablet TAKE 1 TABLET(40 MG) BY MOUTH DAILY  ? penicillin v potassium (VEETID) 250 MG tablet Take by mouth.  ? Tiotropium Bromide-Olodaterol 2.5-2.5 MCG/ACT AERS Inhale 2 puffs into the lungs daily.  ? ?No facility-administered encounter medications on file as of 12/30/2021.  ? ?Recent Relevant Labs: ?Lab Results  ?Component Value Date/Time  ? HGBA1C 6.6 (H) 10/04/2021 10:26 AM  ? HGBA1C 6.2 (H) 09/10/2021 03:07 PM  ? HGBA1C 6.4 (H) 01/15/2021 02:29 PM  ? MICROALBUR 10 09/10/2021 03:07 PM  ? MICROALBUR 10 01/15/2021 02:26 PM  ?  ?Kidney Function ?Lab Results  ?Component Value Date/Time  ? CREATININE 0.84 10/15/2021 09:00 AM  ? CREATININE 0.87 10/05/2021 03:42 AM  ? GFRNONAA >60 10/05/2021 03:42 AM  ? GFRAA 67 07/17/2020 03:09 PM  ? ? ?Have you had any problems recently with your health? Patient states that she is no longer on metformin because it was not good for her arthritis, it made her hands hurt. She also stated that she is off of the Stiolto and on the [employer] inhaler. ? ?Have you had any problems with your pharmacy?Patient states that she does not have ay problems with getting her medications or the cost of medications from the pharmacy ? ?What issues or side effects are you having with your medications?Patient is not having any side effects from medications ? ?What would you like me to pass along to Edison Nasuti Potts,CPP for them to help you with? Patient states that she just saw  Jolene NP last week and discussed all medications ? ?What can we do to take care of you better? Patient states she does not need anything at this time ? ?Adherence Review: ?Is the patient currently on a STATIN medication? No ?Is the patient currently on ACE/ARB medication? No ?Does the patient have >5 day gap between last estimated fill dates? No ? ? ?Care Gaps: ?Colonoscopy-NA ?Diabetic Foot Exam-01/15/21 ?Mammogram-NA ?Ophthalmology-03/06/21 ?Dexa Scan - 08/05/17 ?Annual Well Visit - NA ?Micro albumin-08/21/21 ?Hemoglobin A1c- 10/04/21 ? ?Star Rating Drugs: ?None ID ? ?Ethelene Hal ?Clinical Pharmacist Assistant ?[phone]  ?

## 2021-12-31 ENCOUNTER — Encounter: Payer: Self-pay | Admitting: Nurse Practitioner

## 2021-12-31 ENCOUNTER — Other Ambulatory Visit: Payer: Self-pay

## 2021-12-31 ENCOUNTER — Ambulatory Visit (INDEPENDENT_AMBULATORY_CARE_PROVIDER_SITE_OTHER): Payer: Medicare Other | Admitting: Nurse Practitioner

## 2021-12-31 VITALS — BP 113/73 | HR 89 | Temp 98.7°F | Resp 18 | Ht 69.0 in | Wt 211.0 lb

## 2021-12-31 DIAGNOSIS — M79641 Pain in right hand: Secondary | ICD-10-CM | POA: Diagnosis not present

## 2021-12-31 DIAGNOSIS — G720 Drug-induced myopathy: Secondary | ICD-10-CM | POA: Diagnosis not present

## 2021-12-31 DIAGNOSIS — E559 Vitamin D deficiency, unspecified: Secondary | ICD-10-CM

## 2021-12-31 DIAGNOSIS — I700 Atherosclerosis of aorta: Secondary | ICD-10-CM | POA: Diagnosis not present

## 2021-12-31 DIAGNOSIS — I890 Lymphedema, not elsewhere classified: Secondary | ICD-10-CM | POA: Diagnosis not present

## 2021-12-31 DIAGNOSIS — I7123 Aneurysm of the descending thoracic aorta, without rupture: Secondary | ICD-10-CM | POA: Diagnosis not present

## 2021-12-31 DIAGNOSIS — E1169 Type 2 diabetes mellitus with other specified complication: Secondary | ICD-10-CM | POA: Diagnosis not present

## 2021-12-31 DIAGNOSIS — D692 Other nonthrombocytopenic purpura: Secondary | ICD-10-CM | POA: Diagnosis not present

## 2021-12-31 DIAGNOSIS — E785 Hyperlipidemia, unspecified: Secondary | ICD-10-CM

## 2021-12-31 DIAGNOSIS — N1831 Chronic kidney disease, stage 3a: Secondary | ICD-10-CM | POA: Diagnosis not present

## 2021-12-31 DIAGNOSIS — J432 Centrilobular emphysema: Secondary | ICD-10-CM

## 2021-12-31 DIAGNOSIS — E1351 Other specified diabetes mellitus with diabetic peripheral angiopathy without gangrene: Secondary | ICD-10-CM

## 2021-12-31 DIAGNOSIS — E669 Obesity, unspecified: Secondary | ICD-10-CM

## 2021-12-31 DIAGNOSIS — M79642 Pain in left hand: Secondary | ICD-10-CM

## 2021-12-31 DIAGNOSIS — R8281 Pyuria: Secondary | ICD-10-CM

## 2021-12-31 DIAGNOSIS — I2510 Atherosclerotic heart disease of native coronary artery without angina pectoris: Secondary | ICD-10-CM | POA: Diagnosis not present

## 2021-12-31 DIAGNOSIS — F419 Anxiety disorder, unspecified: Secondary | ICD-10-CM

## 2021-12-31 DIAGNOSIS — E538 Deficiency of other specified B group vitamins: Secondary | ICD-10-CM

## 2021-12-31 LAB — URINALYSIS, ROUTINE W REFLEX MICROSCOPIC
Bilirubin, UA: NEGATIVE
Glucose, UA: NEGATIVE
Ketones, UA: NEGATIVE
Nitrite, UA: NEGATIVE
Specific Gravity, UA: 1.025 (ref 1.005–1.030)
Urobilinogen, Ur: 0.2 mg/dL (ref 0.2–1.0)
pH, UA: 5.5 (ref 5.0–7.5)

## 2021-12-31 LAB — MICROALBUMIN, URINE WAIVED
Creatinine, Urine Waived: 200 mg/dL (ref 10–300)
Microalb, Ur Waived: 80 mg/L — ABNORMAL HIGH (ref 0–19)

## 2021-12-31 LAB — MICROSCOPIC EXAMINATION: WBC, UA: >30 /hpf — ABNORMAL HIGH (ref 0–5)

## 2021-12-31 LAB — BAYER DCA HB A1C WAIVED: HB A1C (BAYER DCA - WAIVED): 5.9 % — ABNORMAL HIGH (ref 4.8–5.6)

## 2021-12-31 MED ORDER — BREZTRI AEROSPHERE 160-9-4.8 MCG/ACT IN AERO: 2.0000 | INHALATION_SPRAY | Freq: Two times a day (BID) | RESPIRATORY_TRACT | 11 refills | Status: DC

## 2021-12-31 NOTE — Assessment & Plan Note (Signed)
Joint pain with statins on various schedules, Zetia, and Praluent.  Could consider Repatha trial in future. ?

## 2021-12-31 NOTE — Assessment & Plan Note (Signed)
Chronic, ongoing. Did not tolerate Rosuvastatin 20 MG three days a week or any other statin.  Have tried multiple statins with ongoing myalgias with use.  Praluent and Zetia also caused discomfort.  Check lipid panel and CMP today.  CCM collaboration continues. ?

## 2021-12-31 NOTE — Assessment & Plan Note (Signed)
Chronic, stable, only uses PRN.  Continue daily Citalopram and monitor.  Adjust dose or medication as needed, do not increase Celexa to 40 MG due to patient age >57.  Denies SI/HI. ?

## 2021-12-31 NOTE — Assessment & Plan Note (Signed)
Chronic, stable with no current symptoms.  Continue current medication regimen and collaboration with cardiology. 

## 2021-12-31 NOTE — Assessment & Plan Note (Signed)
Chronic, stable with no decline on recent labs.  Renal dose medications as needed. CMP and urine ALB today.  For worsening would refer to nephrology. ?

## 2021-12-31 NOTE — Assessment & Plan Note (Signed)
On daily Plavix.  Recommend cleansing skin with gentle cleanser and use of daily lotion.  Monitor for skin breakdown and address if present. 

## 2021-12-31 NOTE — Assessment & Plan Note (Signed)
Chronic, ongoing.  Recommend continue Tylenol daily and add on Voltaren cream to joints or CBD cream.  Will obtain labs to rule out other causes of joint pain. ?

## 2021-12-31 NOTE — Assessment & Plan Note (Signed)
Chronic, stable.  Continue monthly injections. ?

## 2021-12-31 NOTE — Assessment & Plan Note (Signed)
Ongoing.  Recheck Vitamin D level today.  Recommend continue daily Vitamin D3 supplement, 1000 units. ?

## 2021-12-31 NOTE — Progress Notes (Signed)
? ?BP 113/73   Pulse 89   Temp 98.7 ?F (37.1 ?C)   Resp 18   Ht '5\' 9"'$  (1.753 m)   Wt 211 lb (95.7 kg)   LMP  (LMP Unknown)   SpO2 98%   BMI 31.16 kg/m?   ? ?Subjective:  ? ? Patient ID: Sharon [name], female    DOB: 03-21-1946, 76 y.o.   MRN: [id number] ? ?HPI: ?Sharon [name] is a 76 y.o. female ? ?Chief Complaint  ?Patient presents with  ? Diabetes  ? Hyperlipidemia  ? Hypertension  ? COPD  ? Lymphedema  ? Vitamin B12  ? Cough  ?  Patient states her home health nurse Helene Kelp took a listen to her lungs and says he hears a little raspy and wheezing in her lungs, but she feels her cough is a lot better. Patient states she still has a little cough. Patient states she does not think the last inhaler she was prescribed helps her as well as the Symbicort did. Patient would like to discuss as she is using her Albuterol inhaler afterwards.   ? ?DIABETES ?No current medications, was on Metformin which caused discomfort.  December A1c 6.6%.  She has had more urine symptoms recently and would like urine tested, as pee is more yellow. ? ?Continues on monthly injections.  Last level in November was 1017  Has Vitamin D deficiency, continues supplement with recent level 73.1. ?Hypoglycemic episodes:no ?Polydipsia/polyuria: no ?Visual disturbance: no ?Chest pain: no ?Paresthesias: no ?Glucose Monitoring: yes ?            Accucheck frequency: Daily ?            Fasting glucose: 106-107 in morning ?            Post prandial: ?            Evening: 135-140  ?            Before meals: ?Taking Insulin?: no ?            Long acting insulin: ?            Short acting insulin: ?Blood Pressure Monitoring: not checking ?Retinal Examination: Up to Date ?Foot Exam: Up to Date ?Pneumovax: Up to Date ?Influenza: Up To Date ?Aspirin: no  ?  ?COPD ?Taking Stiolto, changed to this last visit, daily and Albuterol PRN.  Feels the Symbicort worked better, but had switched previous visit due to her not feeling this is working at well.  Had recent  exacerbation that was treated with repeat CXR stable -- she reports this is improving, only increased at night. ? ?Goes for annual lung screening annually with last on 08/01/21 noting mild centrilobular emphysema and aortic atherosclerosis.   ?COPD status: stable ?Satisfied with current treatment?: yes ?Oxygen use: no ?Dyspnea frequency: occasional ?Cough frequency: intermittent ?Rescue inhaler frequency:   ?Limitation of activity: no ?Productive cough: none ?Last Spirometry: unknown ?Pneumovax: Up to Date ?Influenza: Up to Date  ?  ?CHRONIC KIDNEY DISEASE ?Recent labs stable. ?CKD status: stable ?Medications renally dose: yes ?Previous renal evaluation: no ?Pneumovax:  Up to Date ?Influenza Vaccine:  Up to Date  ?  ?HYPERLIPIDEMIA ?Last saw cardiology 12/26/21 -- they took loop out.  Has an abdominal aortic ectatic that is < 4 cm, which is being monitored by vascular.  Has pacemaker in place and recent had check remotely on 11/04/21.  Tried Praluent, which caused worsening joint pain -- same with statins was unable to  take these nor Zetia. ?Hyperlipidemia status: good compliance ?Satisfied with current treatment?  yes ?Side effects:  no ?Medication compliance: good compliance ?Supplements: none ?Aspirin:  no ?The ASCVD Risk score (Arnett DK, et al., 2019) failed to calculate for the following reasons: ?  The patient has a prior MI or stroke diagnosis ?Chest pain:  no ?Coronary artery disease:  no ?Family history CAD:  no ?Family history early CAD:  no  ?  ?LYMPHEDEMA WITH CHRONIC VENOUS INSUFFICIENCY + JOINT PAIN: ?Saw vascular last 08/29/21.  Continues to go to massage therapist which she reports benefit from + using massage pump -- last massage therapy visit was 09/09/21 -- per recent notes it is felt she does not need OT any further, they are recommending joint injections.  Does endorses difficulty being off feet, due to caring for husband who receives dialysis 3 times a week.  Does take Tylenol for pain, which  benefits at times.   ? ?Had visit to dermatology on 12/24/21, current cellulitis is improving per report. ?Duration: months ?Pain: yes ?Symmetric: yes ? 9/10 ?Quality: dull, aching, and throbbing ?Frequency: intermittent ?Context:  fluctuating ?Decreased function/range of motion: at times ?Erythema: none ?Swelling: yes ?Heat or warmth: none ?Morning stiffness: yes unsure how long this lasts ?Aggravating factors: unsure ?Alleviating factors: nothing ?Relief with NSAIDs?: No NSAIDs Taken ?Treatments attempted:  rest, heat, and APAP  ?Involved Joints:  ?   Hands: yes bilateral ?   Wrists: yes bilateral  ?   Elbows: none ?   Shoulders: none ?   Back: no  ?   Hips: none ?   Knees: none ?   Ankles: none ?   Feet: none ? ?DEPRESSION ?Continues on Celexa 20 MG as needed for mood, uses for when driving down highway -- reports this helps on a PRN basis. ?Mood status: stable ?Satisfied with current treatment?: yes ?Symptom severity: mild  ?Duration of current treatment : chronic ?Side effects: no ?Medication compliance: good compliance ?Psychotherapy/counseling: none ?Depressed mood: no ?Anxious mood: no ?Anhedonia: no ?Significant weight loss or gain: no ?Insomnia: none ?Fatigue: no ?Feelings of worthlessness or guilt: no ?Impaired concentration/indecisiveness: no ?Suicidal ideations: no ?Hopelessness: no ?Crying spells: no ?Depression screen [facility] 2/9 12/31/2021 09/10/2021 04/19/2021 07/17/2020 04/09/2020  ?Decreased Interest 0 0 0 0 0  ?Down, Depressed, Hopeless 0 0 0 0 0  ?PHQ - 2 Score 0 0 0 0 0  ?Altered sleeping 0 0 - 1 -  ?Tired, decreased energy 1 1 - 1 -  ?Change in appetite 0 0 - 0 -  ?Feeling bad or failure about yourself  0 0 - 0 -  ?Trouble concentrating 0 0 - 0 -  ?Moving slowly or fidgety/restless 0 0 - 0 -  ?Suicidal thoughts 0 0 - 0 -  ?PHQ-9 Score 1 1 - 2 -  ?Difficult doing work/chores Not difficult at all Not difficult at all - Not difficult at all -  ?Some recent data might be hidden  ?  ? ?Relevant past medical,  surgical, family and social history reviewed and updated as indicated. Interim medical history since our last visit reviewed. ?Allergies and medications reviewed and updated. ? ?Review of Systems  ?Constitutional:  Negative for activity change, appetite change, diaphoresis, fatigue and fever.  ?Respiratory:  Negative for cough, chest tightness, shortness of breath and wheezing.   ?Cardiovascular:  Positive for leg swelling. Negative for chest pain and palpitations.  ?Gastrointestinal: Negative.   ?Neurological: Negative.   ?Psychiatric/Behavioral: Negative.    ? ?  Per HPI unless specifically indicated above ? ?   ?Objective:  ?  ?BP 113/73   Pulse 89   Temp 98.7 ?F (37.1 ?C)   Resp 18   Ht '5\' 9"'$  (1.753 m)   Wt 211 lb (95.7 kg)   LMP  (LMP Unknown)   SpO2 98%   BMI 31.16 kg/m?   ?Wt Readings from Last 3 Encounters:  ?12/31/21 211 lb (95.7 kg)  ?12/26/21 211 lb (95.7 kg)  ?11/26/21 212 lb 12.8 oz (96.5 kg)  ?  ?Physical Exam ?Vitals and nursing note reviewed.  ?Constitutional:   ?   General: She is awake. She is not in acute distress. ?   Appearance: She is well-developed and well-groomed. She is not ill-appearing or toxic-appearing.  ?HENT:  ?   Head: Normocephalic.  ?   Right Ear: Hearing normal.  ?   Left Ear: Hearing normal.  ?Eyes:  ?   General: Lids are normal.     ?   Right eye: No discharge.     ?   Left eye: No discharge.  ?   Conjunctiva/sclera: Conjunctivae normal.  ?Cardiovascular:  ?   Rate and Rhythm: Normal rate and regular rhythm.  ?   Heart sounds: No murmur heard. ?  No gallop.  ?Pulmonary:  ?   Effort: Pulmonary effort is normal. No accessory muscle usage or respiratory distress.  ?   Breath sounds: Normal breath sounds.  ?Musculoskeletal:  ?   Cervical back: Normal range of motion.  ?   Right lower leg: 2+ Edema present.  ?   Left lower leg: 2+ Edema present.  ?Skin: ?   Findings: Bruising (mild noted to upper extremities.) present.  ?Neurological:  ?   Mental Status: She is alert and  oriented to person, place, and time.  ?Psychiatric:     ?   Attention and Perception: Attention normal.     ?   Mood and Affect: Mood normal.     ?   Behavior: Behavior normal. Behavior is cooperative.     ?   Thought Content

## 2021-12-31 NOTE — Assessment & Plan Note (Signed)
Chronic, ongoing.  Noted on CT scan 04/19/2019. Did not tolerate statin, Zetia, or Praluent.  ?

## 2021-12-31 NOTE — Assessment & Plan Note (Signed)
Chronic, ongoing with recent A1c 6.6%, recheck today.  Did not tolerate Metformin and is diet focused only at this time. Recommend she monitor BS daily at home.  Urine ALB (urine ALB 10 on check) recently, recheck today.  Return to office 6 months, sooner if medication changes are made. ?

## 2021-12-31 NOTE — Assessment & Plan Note (Signed)
Chronic, ongoing.  At this time stop Stiolto and start [place], as may benefit from adding back in the steroid to the LABA/LAMA.  Continue to collaborate with CCM team.  Continue annual lung screening.  Return in 6 months. ?

## 2021-12-31 NOTE — Assessment & Plan Note (Addendum)
Chronic, ongoing.  Continue use of compression hose daily.  Continue collaboration with vascular and dermatology team.  Appreciate their input.   ?

## 2021-12-31 NOTE — Assessment & Plan Note (Signed)
Chronic, ongoing with lymphedema and PVD.  Continue to collaborate with vascular and current medication regimen.  A1c today.  Continue diet focus for diabetes control. ?

## 2021-12-31 NOTE — Assessment & Plan Note (Signed)
Followed by vascular, last CT scan showed no significant increase in size.  Due for CT scan lung yearly.  Continue to collaborate with vascular. ?

## 2022-01-01 DIAGNOSIS — R8281 Pyuria: Secondary | ICD-10-CM | POA: Diagnosis not present

## 2022-01-01 LAB — COMPREHENSIVE METABOLIC PANEL
ALT: 17 IU/L (ref 0–32)
AST: 15 IU/L (ref 0–40)
Albumin/Globulin Ratio: 1.9 (ref 1.2–2.2)
Albumin: 4.1 g/dL (ref 3.7–4.7)
Alkaline Phosphatase: 96 IU/L (ref 44–121)
BUN/Creatinine Ratio: 14 (ref 12–28)
BUN: 16 mg/dL (ref 8–27)
Bilirubin Total: 0.3 mg/dL (ref 0.0–1.2)
CO2: 24 mmol/L (ref 20–29)
Calcium: 9 mg/dL (ref 8.7–10.3)
Chloride: 104 mmol/L (ref 96–106)
Creatinine, Ser: 1.15 mg/dL — ABNORMAL HIGH (ref 0.57–1.00)
Globulin, Total: 2.2 g/dL (ref 1.5–4.5)
Glucose: 132 mg/dL — ABNORMAL HIGH (ref 70–99)
Potassium: 4.2 mmol/L (ref 3.5–5.2)
Sodium: 142 mmol/L (ref 134–144)
Total Protein: 6.3 g/dL (ref 6.0–8.5)
eGFR: 49 mL/min/{1.73_m2} — ABNORMAL LOW (ref >59)

## 2022-01-01 LAB — LIPID PANEL W/O CHOL/HDL RATIO
Cholesterol, Total: 203 mg/dL — ABNORMAL HIGH (ref 100–199)
HDL: 51 mg/dL (ref >39)
LDL Chol Calc (NIH): 124 mg/dL — ABNORMAL HIGH (ref 0–99)
Triglycerides: 160 mg/dL — ABNORMAL HIGH (ref 0–149)
VLDL Cholesterol Cal: 28 mg/dL (ref 5–40)

## 2022-01-01 LAB — SEDIMENTATION RATE: Sed Rate: 17 mm/hr (ref 0–40)

## 2022-01-01 LAB — ANA W/REFLEX IF POSITIVE: Anti Nuclear Antibody (ANA): NEGATIVE

## 2022-01-01 LAB — C-REACTIVE PROTEIN: CRP: 8 mg/L (ref 0–10)

## 2022-01-01 LAB — URIC ACID: Uric Acid: 7.7 mg/dL (ref 3.1–7.9)

## 2022-01-01 NOTE — Addendum Note (Signed)
Addended by: [name] T on: 01/01/2022 09:00 AM ? ? Modules accepted: Orders ? ?

## 2022-01-01 NOTE — Progress Notes (Signed)
Contacted via [place] ? ? ?Good evening Shakura, your labs have returned: ?- Kidney function, creatinine and eGFR, is showing some mild decline.  I would recommend ensuring to get good water intake daily and avoid Ibuprofen products.  We will recheck next visit. ?- Cholesterol labs remain elevated, for now we will remain off medication and could consider trial of alternate in future. ?- Uric acid and inflammatory labs all normal or negative. ?- I have sent your urine for culture as many bacteria present = if this returns showing infection I will alert you and send in antibiotic.  Any questions? ?Keep being stellar!!  Thank you for allowing me to participate in your care.  I appreciate you. ?Kindest regards, ?Janit Cutter ?

## 2022-01-02 ENCOUNTER — Encounter: Payer: Self-pay | Admitting: Nurse Practitioner

## 2022-01-02 DIAGNOSIS — N3000 Acute cystitis without hematuria: Secondary | ICD-10-CM

## 2022-01-05 LAB — URINE CULTURE

## 2022-01-05 MED ORDER — AMOXICILLIN-POT CLAVULANATE 875-125 MG PO TABS: 1.0000 | ORAL_TABLET | Freq: Two times a day (BID) | ORAL | 0 refills | Status: AC

## 2022-01-05 NOTE — Addendum Note (Signed)
Addended by: [name] T on: 01/05/2022 01:50 PM ? ? Modules accepted: Orders ? ?

## 2022-01-05 NOTE — Progress Notes (Signed)
Contacted via [place] ? ? ?Good afternoon Stanton Kidney, so bad news.  You do have a urine infection:( I am sending in Augmentin which will cover this and we can recheck urine at next visit.  If ongoing symptoms after treatment let me know.

## 2022-01-13 ENCOUNTER — Telehealth: Payer: Self-pay | Admitting: *Deleted

## 2022-01-13 DIAGNOSIS — Z006 Encounter for examination for normal comparison and control in clinical research program: Secondary | ICD-10-CM

## 2022-01-13 NOTE — Telephone Encounter (Signed)
I called patient to let her know about the Prevail study which is looking at a new drug to lower LDL level. I left message for patient to call if interested in the study. I will send an e-mail also with information on the study. ?

## 2022-01-20 ENCOUNTER — Ambulatory Visit (INDEPENDENT_AMBULATORY_CARE_PROVIDER_SITE_OTHER): Payer: Medicare Other

## 2022-01-20 ENCOUNTER — Other Ambulatory Visit: Payer: Medicare Other

## 2022-01-20 DIAGNOSIS — N3000 Acute cystitis without hematuria: Secondary | ICD-10-CM

## 2022-01-20 DIAGNOSIS — E538 Deficiency of other specified B group vitamins: Secondary | ICD-10-CM | POA: Diagnosis not present

## 2022-01-20 LAB — URINALYSIS, ROUTINE W REFLEX MICROSCOPIC
Bilirubin, UA: NEGATIVE
Glucose, UA: NEGATIVE
Ketones, UA: NEGATIVE
Nitrite, UA: NEGATIVE
Protein,UA: NEGATIVE
RBC, UA: NEGATIVE
Specific Gravity, UA: 1.015 (ref 1.005–1.030)
Urobilinogen, Ur: 0.2 mg/dL (ref 0.2–1.0)
pH, UA: 5.5 (ref 5.0–7.5)

## 2022-01-20 LAB — MICROSCOPIC EXAMINATION
Bacteria, UA: NONE SEEN
RBC, Urine: NONE SEEN /hpf (ref 0–2)

## 2022-01-20 MED ORDER — CYANOCOBALAMIN 1000 MCG/ML IJ SOLN
1000.0000 ug | Freq: Once | INTRAMUSCULAR | Status: AC
  Administered 2022-01-20: 1000 ug via INTRAMUSCULAR

## 2022-01-20 NOTE — Progress Notes (Signed)
Contacted via [place] ? ? ?Urine improved!!!!

## 2022-01-22 LAB — URINE CULTURE

## 2022-02-20 ENCOUNTER — Telehealth: Payer: Self-pay

## 2022-02-20 ENCOUNTER — Other Ambulatory Visit (INDEPENDENT_AMBULATORY_CARE_PROVIDER_SITE_OTHER): Payer: Self-pay | Admitting: Nurse Practitioner

## 2022-02-20 NOTE — Telephone Encounter (Signed)
Pt called requesting a refill of cefdinir '300mg'$  pt report her stasis dermatitis on her leg is getting worse, I informed pt Dr Nicole Kindred is out of the office and will not be back in until Monday, if any changes or area worsens over the weekend please seek medical attention.  ?

## 2022-02-24 ENCOUNTER — Telehealth: Payer: Self-pay

## 2022-02-24 NOTE — Telephone Encounter (Signed)
LMOVM please return my call, need to be seen today ?

## 2022-02-24 NOTE — Telephone Encounter (Signed)
See previous phone calls with other medical assistant. ? ?Patient needs follow up appointment before RF of her antibiotic. Offered patient this PM or tomorrow PM but patient declines due to trying to cut down on her medical expenses. Patient already scheduled next week for statis derm follow up. Will keep this appointment at this time and patient states if it becomes worse she will just go to the ER. aw  ?

## 2022-02-25 ENCOUNTER — Other Ambulatory Visit (INDEPENDENT_AMBULATORY_CARE_PROVIDER_SITE_OTHER): Payer: Self-pay | Admitting: Nurse Practitioner

## 2022-02-25 ENCOUNTER — Ambulatory Visit (INDEPENDENT_AMBULATORY_CARE_PROVIDER_SITE_OTHER): Payer: Medicare Other

## 2022-02-25 DIAGNOSIS — E538 Deficiency of other specified B group vitamins: Secondary | ICD-10-CM

## 2022-02-25 DIAGNOSIS — I714 Abdominal aortic aneurysm, without rupture, unspecified: Secondary | ICD-10-CM

## 2022-02-25 MED ORDER — CYANOCOBALAMIN 1000 MCG/ML IJ SOLN
1000.0000 ug | Freq: Once | INTRAMUSCULAR | Status: AC
  Administered 2022-02-25: 1000 ug via INTRAMUSCULAR

## 2022-02-27 ENCOUNTER — Encounter (INDEPENDENT_AMBULATORY_CARE_PROVIDER_SITE_OTHER): Payer: Self-pay | Admitting: Nurse Practitioner

## 2022-02-27 ENCOUNTER — Ambulatory Visit (INDEPENDENT_AMBULATORY_CARE_PROVIDER_SITE_OTHER): Payer: Medicare Other

## 2022-02-27 ENCOUNTER — Ambulatory Visit (INDEPENDENT_AMBULATORY_CARE_PROVIDER_SITE_OTHER): Payer: Medicare Other | Admitting: Nurse Practitioner

## 2022-02-27 VITALS — BP 148/75 | HR 81 | Resp 16 | Wt 212.6 lb

## 2022-02-27 DIAGNOSIS — I714 Abdominal aortic aneurysm, without rupture, unspecified: Secondary | ICD-10-CM

## 2022-02-27 DIAGNOSIS — E1169 Type 2 diabetes mellitus with other specified complication: Secondary | ICD-10-CM

## 2022-02-27 DIAGNOSIS — I890 Lymphedema, not elsewhere classified: Secondary | ICD-10-CM | POA: Diagnosis not present

## 2022-02-27 DIAGNOSIS — E669 Obesity, unspecified: Secondary | ICD-10-CM

## 2022-02-27 MED ORDER — CEFDINIR 300 MG PO CAPS: 300.0000 mg | ORAL_CAPSULE | Freq: Two times a day (BID) | ORAL | 0 refills | Status: DC

## 2022-03-04 ENCOUNTER — Ambulatory Visit (INDEPENDENT_AMBULATORY_CARE_PROVIDER_SITE_OTHER): Payer: Medicare Other | Admitting: Dermatology

## 2022-03-04 DIAGNOSIS — I872 Venous insufficiency (chronic) (peripheral): Secondary | ICD-10-CM

## 2022-03-04 DIAGNOSIS — I8393 Asymptomatic varicose veins of bilateral lower extremities: Secondary | ICD-10-CM | POA: Diagnosis not present

## 2022-03-04 MED ORDER — EUCRISA 2 % EX OINT: 1.0000 "application " | TOPICAL_OINTMENT | Freq: Every day | CUTANEOUS | 1 refills | Status: DC

## 2022-03-04 MED ORDER — DESOXIMETASONE 0.05 % EX CREA: TOPICAL_CREAM | Freq: Two times a day (BID) | CUTANEOUS | 1 refills | Status: DC

## 2022-03-04 NOTE — Patient Instructions (Addendum)
Topical steroids (such as triamcinolone, fluocinolone, fluocinonide, mometasone, clobetasol, halobetasol, betamethasone, hydrocortisone) can cause thinning and lightening of the skin if they are used for too long in the same area. Your physician has selected the right strength medicine for your problem and area affected on the body. Please use your medication only as directed by your physician to prevent side effects.      If You Need Anything After Your Visit  If you have any questions or concerns for your doctor, please call our main line at [phone] and press option 4 to reach your doctor's medical assistant. If no one answers, please leave a voicemail as directed and we will return your call as soon as possible. Messages left after 4 pm will be answered the following business day.   You may also send [place] a message via [place]. We typically respond to MyChart messages within 1-2 business days.  For prescription refills, please ask your pharmacy to contact our office. Our fax number is [phone].  If you have an urgent issue when the clinic is closed that cannot wait until the next business day, you can page your doctor at the number below.    Please note that while we do our best to be available for urgent issues outside of office hours, we are not available 24/7.   If you have an urgent issue and are unable to reach [place], you may choose to seek medical care at your doctor's office, retail clinic, urgent care center, or emergency room.  If you have a medical emergency, please immediately call 911 or go to the emergency department.  Pager Numbers  - Dr. Nehemiah Massed: [phone]  - Dr. Laurence Ferrari: [phone]  - Dr. Nicole Kindred: [phone]  In the event of inclement weather, please call our main line at [phone] for an update on the status of any delays or closures.  Dermatology Medication Tips: Please keep the boxes that topical medications come in in order to help keep track of  the instructions about where and how to use these. Pharmacies typically print the medication instructions only on the boxes and not directly on the medication tubes.   If your medication is too expensive, please contact our office at [phone] option 4 or send [place] a message through [place].   We are unable to tell what your co-pay for medications will be in advance as this is different depending on your insurance coverage. However, we may be able to find a substitute medication at lower cost or fill out paperwork to get insurance to cover a needed medication.   If a prior authorization is required to get your medication covered by your insurance company, please allow [place] 1-2 business days to complete this process.  Drug prices often vary depending on where the prescription is filled and some pharmacies may offer cheaper prices.  The website www.goodrx.com contains coupons for medications through different pharmacies. The prices here do not account for what the cost may be with help from insurance (it may be cheaper with your insurance), but the website can give you the price if you did not use any insurance.  - You can print the associated coupon and take it with your prescription to the pharmacy.  - You may also stop by our office during regular business hours and pick up a GoodRx coupon card.  - If you need your prescription sent electronically to a different pharmacy, notify our office through [facility] or by phone at [phone] option 4.  Si Usted Necesita Algo Despus de Su Visita  Tambin puede enviarnos un mensaje a travs de Pharmacist, community. Por lo general respondemos a los mensajes de MyChart en el transcurso de 1 a 2 das hbiles.  Para renovar recetas, por favor pida a su farmacia que se ponga en contacto con nuestra oficina. Harland Dingwall de fax es [place] [phone].  Si tiene un asunto urgente cuando la clnica est cerrada y que no puede esperar hasta el siguiente da  hbil, puede llamar/localizar a su doctor(a) al nmero que aparece a continuacin.   Por favor, tenga en cuenta que aunque hacemos todo lo posible para estar disponibles para asuntos urgentes fuera del horario de [place], no estamos disponibles las 24 horas del da, los 7 das de la [place].   Si tiene un problema urgente y no puede comunicarse con nosotros, puede optar por buscar atencin mdica  en el consultorio de su doctor(a), en una clnica privada, en un centro de atencin urgente o en una sala de emergencias.  Si tiene Engineering geologist, por favor llame inmediatamente al 911 o vaya a la sala de emergencias.  Nmeros de bper  - Dr. Nehemiah Massed: [phone]  - Dra. Moye: [phone]  - Dra. Nicole Kindred: [phone]  En caso de inclemencias del [place], por favor llame a Johnsie Kindred principal al [phone] para una actualizacin sobre el [place] de cualquier retraso o cierre.  Consejos para la medicacin en dermatologa: Por favor, guarde las cajas en las que vienen los medicamentos de uso tpico para ayudarle a seguir las instrucciones sobre dnde y cmo usarlos. Las farmacias generalmente imprimen las instrucciones del medicamento slo en las cajas y no directamente en los tubos del [place].   Si su medicamento es muy caro, por favor, pngase en contacto con Zigmund Daniel llamando al [phone] y presione la opcin 4 o envenos un mensaje a travs de Pharmacist, community.   No podemos decirle cul ser su copago por los medicamentos por adelantado ya que esto es diferente dependiendo de la cobertura de su seguro. Sin embargo, es posible que podamos encontrar un medicamento sustituto a Electrical engineer un formulario para que el seguro cubra el medicamento que se considera necesario.   Si se requiere una autorizacin previa para que su compaa de seguros [place] su medicamento, por favor permtanos de 1 a 2 das hbiles para completar este proceso.  Los precios de los medicamentos  varan con frecuencia dependiendo del Environmental consultant de dnde se surte la receta y alguna farmacias pueden ofrecer precios ms baratos.  El sitio web www.goodrx.com tiene cupones para medicamentos de Airline pilot. Los precios aqu no tienen en cuenta lo que podra costar con la ayuda del seguro (puede ser ms barato con su seguro), pero el sitio web puede darle el precio si no utiliz Research scientist (physical sciences).  - Puede imprimir el cupn correspondiente y llevarlo con su receta a la farmacia.  - Tambin puede pasar por nuestra oficina durante el horario de atencin regular y Charity fundraiser una tarjeta de cupones de GoodRx.  - Si necesita que su receta se enve electrnicamente a una farmacia diferente, informe a nuestra oficina a travs de MyChart de [place] o por telfono llamando al [phone] y presione la opcin 4.

## 2022-03-04 NOTE — Progress Notes (Addendum)
Follow-Up Visit   Subjective  Sharon [name] is a 76 y.o. female who presents for the following: venous stasis dermatitis of left lower extremity (Hx at left lower leg. 2 month follow up.  Reports recent flare last week, seen by Vein and Vascular give rx Cefdinir 300 mg tab bid  /Currently using juxtalite compression garments qd, lymphedema pump several times a day and alternating between tacrolimus ointment and desoximetasone 0.05 % cream. /Reports tacrolimus ointment has been causing redness and burning when applied. Would like to discuss. /) and Varicose Veins (Patient would like to discuss veins at left lower leg that swell when she uses her lymphedema pump.).  The following portions of the chart were reviewed this encounter and updated as appropriate:      Review of Systems: No other skin or systemic complaints except as noted in HPI or Assessment and Plan.   Objective  Well appearing patient in no apparent distress; mood and affect are within normal limits.  A focused examination was performed including left lower leg. Relevant physical exam findings are noted in the Assessment and Plan.  Left Lower Leg - Anterior 2 + pitting edema at pretibia with mild erythema, peau d'orange textural change     Assessment & Plan  Venous stasis dermatitis of left lower extremity Left Lower Leg - Anterior  Chronic and persistent condition with duration or expected duration over one year. Condition is symptomatic/ bothersome to patient.   Patient recently flared in the last week and prescribed Cefdinir 300 mg oral bid for 1 week.  rx given by Vein and Vascular provider and she is now improving. Patient will complete course of prescribed treatment.   Patient reports skin allergy to elidel, lanolin, tmc, and is unsure of other allergies.  Will plan patch testing to determine other allergies.    Stasis in the legs causes chronic leg swelling, which may result in itchy or painful rashes, skin  discoloration, skin texture changes, and sometimes ulceration.  Recommend daily graduated compression hose/stockings- easiest to put on first thing in morning, remove at bedtime.  Elevate legs as much as possible. Avoid salt/sodium rich foods.  Stop Tacrolimus 0.1% oint due to irritation Start Eucrisa 2% ointment  (samples given) - apply topically to affected areas qd/bid Continue Desoximetasone 0.05 %  cream qohs to aa L lower leg. Avoid face, groin, axilla. Caution skin atrophy with long-term use.   Cont Juxtalite compression garment qd Cont Lymphedema pump several times a day Cont CeraVe Itch-Relief and/or CeraVe SA prn   Topical steroids (such as triamcinolone, fluocinolone, fluocinonide, mometasone, clobetasol, halobetasol, betamethasone, hydrocortisone) can cause thinning and lightening of the skin if they are used for too long in the same area. Your physician has selected the right strength medicine for your problem and area affected on the body. Please use your medication only as directed by your physician to prevent side effects.     desoximetasone (TOPICORT) 0.05 % cream - Left Lower Leg - Anterior Apply topically 2 (two) times daily.  Crisaborole (EUCRISA) 2 % OINT - Left Lower Leg - Anterior Apply 1 application. topically daily. Use for itching and inflammation at left leg   Varicose Veins/Spider Veins - Dilated blue, purple or red veins at the lower extremities - Reassured - Recommend 2 - 3 treatments at left lower leg - Smaller vessels can be treated by sclerotherapy (a procedure to inject a medicine into the veins to make them disappear) if desired, but the treatment is not  covered by insurance ($350 noncovered).  Larger vessels may be covered if symptomatic and we would refer to vascular surgeon if treatment desired.  Return for 1 month patch testing. I, Ruthell Rummage, CMA, am acting as scribe for Brendolyn Patty, MD.  Documentation: I have reviewed the above documentation  for accuracy and completeness, and I agree with the above.  Brendolyn Patty MD

## 2022-03-09 NOTE — Progress Notes (Signed)
Subjective:    Patient ID: Sharon [name], female    DOB: May 27, 1946, 76 y.o.   MRN: [id number] Chief Complaint  Patient presents with   Follow-up    Ultrasound follow up    Sharon [name] is a 76 year old female that returns to the office for surveillance of a known abdominal aortic aneurysm. Patient denies abdominal pain or back pain, no other abdominal complaints. No changes suggesting embolic episodes.    There have been no interval changes in the patient's overall health care since his last visit.  She has known lymphedema as well and her swelling is generally well controlled currently.  No recurrent episodes of cellulitis   Patient denies amaurosis fugax or TIA symptoms. There is no history of claudication or rest pain symptoms of the lower extremities. The patient denies angina or shortness of breath.   The patient has a longstanding history of lymphedema with multiple episodes of cellulitis.  The patient has concerns that her redness is worsening following a very bad bout of cellulitis which required hospitalization around Christmas.   Duplex US of the aorta and iliac arteries shows an AAA measured 3.28 cm which is decreased from previous reading of 3.8 cm on 02/26/2021.  It was noted that this exam was somewhat limited due to bowel gas iliac arteries were not able to be visualized.   Review of Systems  Cardiovascular:  Positive for leg swelling.  All other systems reviewed and are negative.     Objective:   Physical Exam Vitals reviewed.  HENT:     Head: Normocephalic.  Cardiovascular:     Rate and Rhythm: Normal rate.     Pulses: Normal pulses.  Pulmonary:     Effort: Pulmonary effort is normal.  Skin:    General: Skin is warm and dry.  Neurological:     Mental Status: She is alert and oriented to person, place, and time.  Psychiatric:        Mood and Affect: Mood normal.        Behavior: Behavior normal.        Thought Content: Thought content normal.         Judgment: Judgment normal.    BP (!) 148/75 (BP Location: Left Arm)   Pulse 81   Resp 16   Wt 212 lb 9.6 oz (96.4 kg)   LMP  (LMP Unknown)   BMI 31.40 kg/m   Past Medical History:  Diagnosis Date   Anxiety    Arthritis    hands, upper back   Asthma    COPD (chronic obstructive pulmonary disease) (HCC)    History of cervical cancer    Menopausal disorder    Osteoporosis    Pneumonia 1960   Spasm of abdominal muscles of right side    intermittent   Stroke ([place]) 2019   TMJ (dislocation of temporomandibular joint)    Wears dentures    partial lower    Social History   Socioeconomic History   Marital status: Married    Spouse name: Audrea Bolte   Number of children: 1   Years of education: Not on file   Highest education level: Some college, no degree  Occupational History   Occupation: retired  Tobacco Use   Smoking status: Former    Years: 56.00    Types: Cigarettes    Quit date: 07/14/2016    Years since quitting: 5.6   Smokeless tobacco: Never  Vaping Use   Vaping Use: Former  Substance  and Sexual Activity   Alcohol use: Not Currently    Comment: occassional   Drug use: No   Sexual activity: Not Currently    Birth control/protection: Post-menopausal  Other Topics Concern   Not on file  Social History Narrative   Lives with husband, manages farm   Social Determinants of Health   Financial Resource Strain: Low Risk    Difficulty of Paying Living Expenses: Not hard at all  Food Insecurity: No Food Insecurity   Worried About Charity fundraiser in the Last Year: Never true   Arboriculturist in the Last Year: Never true  Transportation Needs: No Transportation Needs   Lack of Transportation (Medical): No   Lack of Transportation (Non-Medical): No  Physical Activity: Inactive   Days of Exercise per Week: 0 days   Minutes of Exercise per Session: 0 min  Stress: Stress Concern Present   Feeling of Stress : Rather much  Social Connections: Not on file   Intimate Partner Violence: Not on file    Past Surgical History:  Procedure Laterality Date   ABDOMINAL HYSTERECTOMY  1970's   bladder botox  2005   BLADDER SUSPENSION  2004   CATARACT EXTRACTION W/PHACO Right 03/09/2018   Procedure: CATARACT EXTRACTION PHACO AND INTRAOCULAR LENS PLACEMENT ([place]) right;  Surgeon: Eulogio Bear, MD;  Location: [facility];  Service: Ophthalmology;  Laterality: Right;  CALL CELL 1ST   CATARACT EXTRACTION W/PHACO Left 04/19/2018   Procedure: CATARACT EXTRACTION PHACO AND INTRAOCULAR LENS PLACEMENT ([facility])  LEFT;  Surgeon: Eulogio Bear, MD;  Location: [facility];  Service: Ophthalmology;  Laterality: Left;   COLONOSCOPY WITH PROPOFOL N/A 12/13/2015   Procedure: COLONOSCOPY WITH PROPOFOL;  Surgeon: Lucilla Lame, MD;  Location: [facility];  Service: Endoscopy;  Laterality: N/A;   COLONOSCOPY WITH PROPOFOL N/A 03/14/2021   Procedure: COLONOSCOPY WITH PROPOFOL;  Surgeon: Lucilla Lame, MD;  Location: [facility];  Service: Endoscopy;  Laterality: N/A;  diabetic   LOOP RECORDER INSERTION N/A 01/07/2018   Procedure: LOOP RECORDER INSERTION;  Surgeon: Deboraha Sprang, MD;  Location: [facility];  Service: Cardiovascular;  Laterality: N/A;   POLYPECTOMY N/A 12/13/2015   Procedure: POLYPECTOMY;  Surgeon: Lucilla Lame, MD;  Location: [facility];  Service: Endoscopy;  Laterality: N/A;  SIGMOID COLON POLYPS X  5   POLYPECTOMY N/A 03/14/2021   Procedure: POLYPECTOMY;  Surgeon: Lucilla Lame, MD;  Location: [facility];  Service: Endoscopy;  Laterality: N/A;   SHOULDER ARTHROSCOPY W/ ROTATOR CUFF REPAIR Right 1998   TEE WITHOUT CARDIOVERSION N/A 01/06/2018   Procedure: TRANSESOPHAGEAL ECHOCARDIOGRAM (TEE);  Surgeon: Minna Merritts, MD;  Location: [facility];  Service: Cardiovascular;  Laterality: N/A;   TONSILLECTOMY AND ADENOIDECTOMY      Family History  Problem Relation Age of Onset   Diabetes Mother    Heart  disease Mother    Stroke Mother    Stroke Maternal Grandmother     Allergies  Allergen Reactions   Statins Other (See Comments)    myalgia   Dermacerin [Aquaphilic]    Quinolones     FLUOROQUINOLONES - Pt reports she was told to NEVER take these.   Baclofen Swelling   Bactrim [Sulfamethoxazole-Trimethoprim] Nausea And Vomiting   Ibuprofen Rash    Mouth swelling   Lanolin Rash    Allergy to triamcinolone    Latex Rash    Some bandaids, some gloves; BLOOD TEST NEGATIVE   Librium [Chlordiazepoxide] Itching  Dizziness    Naprosyn [Naproxen] Rash    Mouth swelling   Other Rash    [place] nuts - mouth swelling       Latest Ref Rng & Units 11/26/2021    1:57 PM 10/22/2021    4:46 PM 10/15/2021    9:00 AM  CBC  WBC 3.4 - 10.8 x10E3/uL 6.1   14.5   7.8    Hemoglobin 11.1 - 15.9 g/dL 13.5   13.5   14.9    Hematocrit 34.0 - 46.6 % 41.1   39.1   44.1    Platelets 150 - 450 x10E3/uL 232   199   277        CMP     Component Value Date/Time   NA 142 12/31/2021 1403   K 4.2 12/31/2021 1403   CL 104 12/31/2021 1403   CO2 24 12/31/2021 1403   GLUCOSE 132 (H) 12/31/2021 1403   GLUCOSE 123 (H) 10/04/2021 1026   BUN 16 12/31/2021 1403   CREATININE 1.15 (H) 12/31/2021 1403   CALCIUM 9.0 12/31/2021 1403   PROT 6.3 12/31/2021 1403   ALBUMIN 4.1 12/31/2021 1403   AST 15 12/31/2021 1403   ALT 17 12/31/2021 1403   ALKPHOS 96 12/31/2021 1403   BILITOT 0.3 12/31/2021 1403   GFRNONAA >60 10/05/2021 0342   GFRAA 67 07/17/2020 1509     No results found.     Assessment & Plan:   1. Abdominal aortic aneurysm (AAA) without rupture, unspecified part ([place]) No surgery or intervention at this time. The patient has an asymptomatic abdominal aortic aneurysm that is less than 4 cm in maximal diameter.  I have discussed the natural history of abdominal aortic aneurysm and the small risk of rupture for aneurysm less than 5 cm in size.  However, as these small aneurysms tend to enlarge over  time, continued surveillance with ultrasound or CT scan is mandatory.  I have also discussed optimizing medical management with hypertension and lipid control and the importance of abstinence from tobacco.  The patient is also encouraged to exercise a minimum of 30 minutes 4 times a week.  Should the patient develop new onset abdominal or back pain or signs of peripheral embolization they are instructed to seek medical attention immediately and to alert the physician providing care that they have an aneurysm.  The patient voices their understanding. The patient will return in 12 months with an aortic duplex per patient preference.  2. Lymphedema The patient's lymphedema is improved but there is some redness that is concerning for the patient and given her history of repeated cellulitis we will place her on an antibiotic to prevent any further possible worsening of cellulitis.  3. Type 2 diabetes mellitus with obesity ([place]) Continue hypoglycemic medications as already ordered, these medications have been reviewed and there are no changes at this time.  Hgb A1C to be monitored as already arranged by primary service    Current Outpatient Medications on File Prior to Visit  Medication Sig Dispense Refill   acetaminophen (TYLENOL) 500 MG tablet Take 500 mg by mouth every 6 (six) hours as needed.     albuterol (PROAIR HFA) 108 (90 Base) MCG/ACT inhaler Inhale 2 puffs into the lungs every 6 (six) hours as needed for wheezing or shortness of breath. 18 g 4   Blood Glucose Monitoring Suppl (ONETOUCH VERIO) w/Device KIT Utilize to check blood sugar twice a day, fasting in morning with goal < 130 and then 2 hours after  a meal with goal <180.  Document and bring to visits. 1 kit 0   Budeson-Glycopyrrol-Formoterol (BREZTRI AEROSPHERE) 160-9-4.8 MCG/ACT AERO Inhale 2 puffs into the lungs 2 (two) times daily. 10.7 g 11   Cholecalciferol 1.25 MG (50000 UT) TABS Take 1 tablet by mouth once a week. 14 tablet 4    citalopram (CELEXA) 20 MG tablet Take 1 tablet (20 mg total) by mouth as needed. 90 tablet 4   clopidogrel (PLAVIX) 75 MG tablet TAKE 1 TABLET(75 MG) BY MOUTH DAILY 90 tablet 2   Cyanocobalamin 1000 MCG/ML KIT Inject 1,000 mcg as directed every 30 (thirty) days.     glucose blood (ONETOUCH VERIO) test strip Utilize to check blood sugar twice a day, fasting in morning with goal < 130 and then 2 hours after a meal with goal <180.  Document and bring to visits. 100 each 12   Lancets (ONETOUCH ULTRASOFT) lancets Utilize to check blood sugar twice a day, fasting in morning with goal < 130 and then 2 hours after a meal with goal <180.  Document and bring to visits. 100 each 12   loratadine (CLARITIN) 10 MG tablet Take 1 tablet (10 mg total) by mouth daily. (Patient taking differently: Take 10 mg by mouth at bedtime.) 90 tablet 4   pantoprazole (PROTONIX) 40 MG tablet TAKE 1 TABLET(40 MG) BY MOUTH DAILY 90 tablet 4   penicillin v potassium (VEETID) 250 MG tablet TAKE 1 TABLET(250 MG) BY MOUTH TWICE DAILY 60 tablet 3   No current facility-administered medications on file prior to visit.    There are no Patient Instructions on file for this visit. No follow-ups on file.   Kris Hartmann, NP

## 2022-03-10 ENCOUNTER — Encounter (INDEPENDENT_AMBULATORY_CARE_PROVIDER_SITE_OTHER): Payer: Self-pay | Admitting: Nurse Practitioner

## 2022-03-19 ENCOUNTER — Telehealth: Payer: Self-pay | Admitting: Cardiovascular Disease

## 2022-03-19 NOTE — Telephone Encounter (Signed)
   Pre-operative Risk Assessment    Patient Name: Sharon [name]  DOB: 1946/09/11 MRN: [id number]     Request for Surgical Clearance    Procedure:  Dental Extraction - Amount of Teeth to be Pulled:  1  Date of Surgery:  Clearance TBD                                 Surgeon: Dr. Rudi Heap Surgeon's Group or Practice Name:  [facility] Phone number:  [phone] Fax number:  [phone]   Type of Clearance Requested:   - Medical    Type of Anesthesia:  Local    Additional requests/questions:  Please advise surgeon/provider what medications should be held.  Signed, Belisicia Lavell Anchors   03/19/2022, 3:24 PM

## 2022-03-20 ENCOUNTER — Telehealth: Payer: Self-pay | Admitting: Cardiovascular Disease

## 2022-03-20 NOTE — Telephone Encounter (Signed)
   Pre-operative Risk Assessment    Patient Name: Sharon [name]  DOB: 20-Jun-1946 MRN: [id number]     Request for Surgical Clearance    Procedure:  Extraction of a lower pre-molar taht has non-restorable decay, 1 tooth  Date of Surgery:  Clearance TBD                                 Surgeon:  unknown Surgeon's Group or Practice Name:  [facility] Phone number:  [phone] Fax number:  [phone]   Type of Clearance Requested:   - Medical    Type of Anesthesia:  Not Indicated   Additional requests/questions:    Signed, Pilar A Ham   03/20/2022, 1:55 PM

## 2022-03-20 NOTE — Telephone Encounter (Signed)
   Primary Cardiologist: Ida Rogue, MD  Chart reviewed as part of pre-operative protocol coverage. Simple dental extractions are considered low risk procedures per guidelines and generally do not require any specific cardiac clearance. It is also generally accepted that for simple extractions and dental cleanings, there is no need to interrupt blood thinner therapy.   SBE prophylaxis is not required for the patient.  I will route this recommendation to the requesting party via Epic fax function and remove from pre-op pool.  Please call with questions.  Lenna Sciara, NP 03/20/2022, 3:29 PM

## 2022-03-20 NOTE — Telephone Encounter (Signed)
All error

## 2022-03-21 NOTE — Telephone Encounter (Signed)
   Patient Name: Sharon [name]  DOB: [birth year] MRN: [id number]  Primary Cardiologist: Ida Rogue, MD  Chart reviewed as part of pre-operative protocol coverage.   Simple dental extractions (i.e. 1-2 teeth) are considered low risk procedures per guidelines and generally do not require any specific cardiac clearance. It is also generally accepted that for simple extractions and dental cleanings, there is no need to interrupt blood thinner therapy.  SBE prophylaxis is not required for the patient from a cardiac standpoint.  I will route this recommendation to the requesting party via Epic fax function and remove from pre-op pool.  Please call with questions.  Lenna Sciara, NP 03/21/2022, 12:17 PM

## 2022-03-27 ENCOUNTER — Ambulatory Visit (INDEPENDENT_AMBULATORY_CARE_PROVIDER_SITE_OTHER): Payer: Medicare Other

## 2022-03-27 DIAGNOSIS — E538 Deficiency of other specified B group vitamins: Secondary | ICD-10-CM

## 2022-03-27 MED ORDER — CYANOCOBALAMIN 1000 MCG/ML IJ SOLN
1000.0000 ug | INTRAMUSCULAR | Status: AC
  Administered 2022-03-27 – 2022-08-26 (×6): 1000 ug via INTRAMUSCULAR

## 2022-03-28 ENCOUNTER — Other Ambulatory Visit: Payer: Self-pay | Admitting: Nurse Practitioner

## 2022-03-28 NOTE — Telephone Encounter (Signed)
D/C 05/01/21.

## 2022-04-08 ENCOUNTER — Ambulatory Visit: Payer: Medicare Other | Admitting: Dermatology

## 2022-04-12 ENCOUNTER — Other Ambulatory Visit (INDEPENDENT_AMBULATORY_CARE_PROVIDER_SITE_OTHER): Payer: Self-pay | Admitting: Nurse Practitioner

## 2022-04-16 ENCOUNTER — Telehealth (INDEPENDENT_AMBULATORY_CARE_PROVIDER_SITE_OTHER): Payer: Self-pay | Admitting: Nurse Practitioner

## 2022-04-16 NOTE — Telephone Encounter (Signed)
Patient called in requesting a refill on her medication. States the pharmacy  told her she had to contact us   cefdinir (OMNICEF) 300 MG capsule  [facility] [place], [address] AT [facility] OF [address]    Please call and advise

## 2022-04-16 NOTE — Telephone Encounter (Signed)
Can we call the patient and see what is going on.  She was on penicillin for prophylaxis of cellulitis .  This medication was given because she seemed to have an active infection.  So the question is does she have an active infection or is she looking for a prophylactic dose

## 2022-04-17 ENCOUNTER — Other Ambulatory Visit: Payer: Self-pay

## 2022-04-17 ENCOUNTER — Telehealth: Payer: Self-pay

## 2022-04-17 NOTE — Telephone Encounter (Signed)
Copied from [place] [id number]. Topic: Appointment Scheduling - Scheduling Inquiry for Clinic >> Apr 17, 2022  2:12 PM Tiffany B wrote: Reason for CRM: Patient would like to [facility] her AWV, please reach out

## 2022-04-17 NOTE — Telephone Encounter (Signed)
Called patient back to reschedule appointment and husband stated she was unavailable and will call back tomorrow.  Please reschedule if patient calls back.

## 2022-04-17 NOTE — Telephone Encounter (Signed)
Pt states that her L leg is bright red and have blisters under the skin over halfway of her shin.  She states she has been putting Eucrisa cream from [facility] the blisters are getting better. Pt states she has not been taking the penicillin due to no refills.  Pt states R leg is somewhat red but not like the left.  Left leg is little swollen right leg is not.  She states that she has been wearing her compression stockings faithfully.

## 2022-04-18 ENCOUNTER — Telehealth: Payer: Self-pay

## 2022-04-18 ENCOUNTER — Other Ambulatory Visit (INDEPENDENT_AMBULATORY_CARE_PROVIDER_SITE_OTHER): Payer: Self-pay | Admitting: Nurse Practitioner

## 2022-04-18 MED ORDER — CEFDINIR 300 MG PO CAPS: 300.0000 mg | ORAL_CAPSULE | Freq: Two times a day (BID) | ORAL | 0 refills | Status: DC

## 2022-04-18 NOTE — Telephone Encounter (Signed)
I sent in a refill but if she feels it is not working, she would need to be seen by [place] or her PCP before a refill can be sent

## 2022-04-18 NOTE — Telephone Encounter (Signed)
Spoke with pt's husband to let him know that a refill was sent and if the pt is not feeling that she is getting better early next week she should be seen by [place] or her PCP he stated verbal understanding.

## 2022-04-18 NOTE — Telephone Encounter (Signed)
Copied from [place] [id number]. Topic: Appointment Scheduling - Scheduling Inquiry for Clinic >> Apr 18, 2022 10:38 AM Everette C wrote: Reason for CRM: The patient would like to speak with Nigel Bridgeman when possible  The patient would like to reschedule their AWV   The patient would like to know if their AWV could be done on 04/23/22, 7/20, 7/21, 7/26 and  7/27  Please contact further when possible    Routing message to [employer].

## 2022-04-21 ENCOUNTER — Telehealth: Payer: Self-pay

## 2022-04-21 ENCOUNTER — Ambulatory Visit: Payer: Medicare Other

## 2022-04-21 NOTE — Telephone Encounter (Signed)
Copied from [place] [id number]. Topic: Appointment Scheduling - Scheduling Inquiry for Clinic >> Apr 21, 2022  9:28 AM Cyndi Bender wrote: Reason for CRM: Pt stated she was returned Patricia's call to reschedule Medicare Well Visit. Cb# [phone]   Routing to [place].

## 2022-04-28 ENCOUNTER — Ambulatory Visit: Payer: Medicare Other

## 2022-04-29 ENCOUNTER — Ambulatory Visit (INDEPENDENT_AMBULATORY_CARE_PROVIDER_SITE_OTHER): Payer: Medicare Other

## 2022-04-29 DIAGNOSIS — E538 Deficiency of other specified B group vitamins: Secondary | ICD-10-CM

## 2022-05-01 ENCOUNTER — Ambulatory Visit: Payer: Medicare Other

## 2022-05-05 NOTE — Progress Notes (Unsigned)
Cardiology Office Note    Date:  05/06/2022   ID:  Sharon, [name] [birth year], MRN [id number]  PCP:  [name], NP  Cardiologist:  Ida Rogue, MD  Electrophysiologist:  Virl Axe, MD   Chief Complaint: Follow-up  History of Present Illness:   Sharon [name] is a 76 y.o. female with history of embolic CVA in 10/5724, coronary artery calcification, aortic atherosclerosis with stable dilatation of the descending thoracic aorta measuring up to 4 cm in 07/2021, stable mild AAA by ultrasound in 02/2022 followed by vascular surgery, COPD secondary to prior tobacco use quitting in 2017 with a 33-pack-year history, lymphedema, cervical cancer, asthma, and anxiety who presents for follow-up of coronary calcification.   She was admitted to the hospital in 12/2017 with slurred speech and weakness involving the left hand with associated facial droop.  She was found to have multiple right cerebral infarcts concerning for embolic phenomena.  Carotid artery ultrasound showed minor carotid artery atherosclerosis with no hemodynamically significant disease.  Surface echo showed an EF of 70% with no regional wall motion abnormalities.  TEE showed an EF of 55 to 65%, no regional wall motion abnormalities, no left atrial appendage thrombus, and no cardiac source of emboli identified with a normal sized aortic root, ascending thoracic aorta, aortic arch, and descending thoracic aorta.  She subsequently underwent ILR implantation with no evidence of A. fib identified.  Echo from 09/2018 showed an EF of 55 to 60%, no regional wall motion abnormalities, grade 1 diastolic dysfunction, upper limit of normal PASP, no significant valvular abnormalities, and a small pericardial effusion identified posterior to the heart.  She was seen in the office in 02/2021 for pre-procedure cardiac risk stratification and was doing well from a cardiac perspective.  She was referred to the lipid clinic for further management of  her hyperlipidemia, and was started on Praluent.  However, this has been discontinued secondary to injection site reaction and influenza-like symptoms.   She was admitted to the hospital in 09/2021 with contact/stasis dermatitis of the left lower extremity.  Venous Doppler was negative for DVT.  Initially, she was treated with antibiotics, though these were discontinued per ID recommendation.  She had notable improvement following initiation of steroid for contact dermatitis.    She was seen in 10/2021 noting improvement in her stasis dermatitis.  With regards to her history of CVA, ILR showed no evidence of A-fib with loop recorder being explanted in 12/2021.  Echo in 12/2021 demonstrated an EF of 55 to 60%, no regional wall motion abnormalities, grade 2 diastolic dysfunction, normal RV systolic function and ventricular cavity size, trivial mitral regurgitation, and an estimated right atrial pressure of 3 mmHg.  AAA ultrasound in 02/2022 demonstrated the largest aortic measurement of 3.3 cm which was essentially unchanged when compared to study from 08/2021, that measured 3.2 cm.  She comes in doing well from a cardiac perspective, and is without symptoms of angina or decompensation.  Her chronic dyspnea is overall stable.  She is without symptoms of orthopnea, PND, early satiety.  She does continue to try and watch what she eats and focuses on a heart healthy diet.  She has been more sedentary.  She does continue to wear compression stockings and use lymphedema pumps with regards to her lower extremity swelling.  This is overall stable and improved when compared to her last visit with [place].  No dizziness, presyncope, or syncope.   Labs independently reviewed: 12/2021 - TC 203, TG  160, HDL 51, LDL 124, BUN 16, serum creatinine 1.15, potassium 4.2, albumin 4.1, AST/ALT normal, A1c 5.9 11/2021 - Hgb 13.5, PLT 232 08/2021 - TSH normal  Past Medical History:  Diagnosis Date   Anxiety    Arthritis     hands, upper back   Asthma    COPD (chronic obstructive pulmonary disease) (HCC)    History of cervical cancer    Menopausal disorder    Osteoporosis    Pneumonia 1960   Spasm of abdominal muscles of right side    intermittent   Stroke ([place]) 2019   TMJ (dislocation of temporomandibular joint)    Wears dentures    partial lower    Past Surgical History:  Procedure Laterality Date   ABDOMINAL HYSTERECTOMY  1970's   bladder botox  2005   BLADDER SUSPENSION  2004   CATARACT EXTRACTION W/PHACO Right 03/09/2018   Procedure: CATARACT EXTRACTION PHACO AND INTRAOCULAR LENS PLACEMENT ([place]) right;  Surgeon: Eulogio Bear, MD;  Location: [place];  Service: Ophthalmology;  Laterality: Right;  CALL CELL 1ST   CATARACT EXTRACTION W/PHACO Left 04/19/2018   Procedure: CATARACT EXTRACTION PHACO AND INTRAOCULAR LENS PLACEMENT ([facility])  LEFT;  Surgeon: Eulogio Bear, MD;  Location: [place];  Service: Ophthalmology;  Laterality: Left;   COLONOSCOPY WITH PROPOFOL N/A 12/13/2015   Procedure: COLONOSCOPY WITH PROPOFOL;  Surgeon: Lucilla Lame, MD;  Location: [place];  Service: Endoscopy;  Laterality: N/A;   COLONOSCOPY WITH PROPOFOL N/A 03/14/2021   Procedure: COLONOSCOPY WITH PROPOFOL;  Surgeon: Lucilla Lame, MD;  Location: [place];  Service: Endoscopy;  Laterality: N/A;  diabetic   LOOP RECORDER INSERTION N/A 01/07/2018   Procedure: LOOP RECORDER INSERTION;  Surgeon: Deboraha Sprang, MD;  Location: [facility];  Service: Cardiovascular;  Laterality: N/A;   POLYPECTOMY N/A 12/13/2015   Procedure: POLYPECTOMY;  Surgeon: Lucilla Lame, MD;  Location: [place];  Service: Endoscopy;  Laterality: N/A;  SIGMOID COLON POLYPS X  5   POLYPECTOMY N/A 03/14/2021   Procedure: POLYPECTOMY;  Surgeon: Lucilla Lame, MD;  Location: [place];  Service: Endoscopy;  Laterality: N/A;   SHOULDER ARTHROSCOPY W/ ROTATOR CUFF REPAIR Right 1998    TEE WITHOUT CARDIOVERSION N/A 01/06/2018   Procedure: TRANSESOPHAGEAL ECHOCARDIOGRAM (TEE);  Surgeon: Minna Merritts, MD;  Location: [facility];  Service: Cardiovascular;  Laterality: N/A;   TONSILLECTOMY AND ADENOIDECTOMY     TOOTH EXTRACTION     July 2023    Current Medications: Current Meds  Medication Sig   acetaminophen (TYLENOL) 500 MG tablet Take 500 mg by mouth every 6 (six) hours as needed.   albuterol (PROAIR HFA) 108 (90 Base) MCG/ACT inhaler Inhale 2 puffs into the lungs every 6 (six) hours as needed for wheezing or shortness of breath.   Blood Glucose Monitoring Suppl (ONETOUCH VERIO) w/Device KIT Utilize to check blood sugar twice a day, fasting in morning with goal < 130 and then 2 hours after a meal with goal <180.  Document and bring to visits.   Budeson-Glycopyrrol-Formoterol (BREZTRI AEROSPHERE) 160-9-4.8 MCG/ACT AERO Inhale 2 puffs into the lungs 2 (two) times daily.   Cholecalciferol 1.25 MG (50000 UT) TABS Take 1 tablet by mouth once a week.   citalopram (CELEXA) 20 MG tablet Take 1 tablet (20 mg total) by mouth as needed.   clopidogrel (PLAVIX) 75 MG tablet TAKE 1 TABLET(75 MG) BY MOUTH DAILY   Crisaborole (EUCRISA) 2 % OINT Apply 1 application. topically  daily. Use for itching and inflammation at left leg   Cyanocobalamin 1000 MCG/ML KIT Inject 1,000 mcg as directed every 30 (thirty) days.   desoximetasone (TOPICORT) 0.05 % cream Apply topically 2 (two) times daily.   glucose blood (ONETOUCH VERIO) test strip UTILIIZE TO CHECK BLOOD SUGAR TWICE DAILY FASTING IN MORNING WITH GOAL<130 AND THAN 2 HOURS AFTER A MEAL WITH GGOAL<180   Lancets (ONETOUCH ULTRASOFT) lancets Utilize to check blood sugar twice a day, fasting in morning with goal < 130 and then 2 hours after a meal with goal <180.  Document and bring to visits.   loratadine (CLARITIN) 10 MG tablet Take 1 tablet (10 mg total) by mouth daily.   pantoprazole (PROTONIX) 40 MG tablet TAKE 1 TABLET(40 MG) BY MOUTH DAILY    penicillin v potassium (VEETID) 500 MG tablet Take 500 mg by mouth 2 (two) times daily as needed.   Current Facility-Administered Medications for the 05/06/22 encounter (Office Visit) with Rise Mu, PA-C  Medication   cyanocobalamin ((VITAMIN B-12)) injection 1,000 mcg    Allergies:   Statins, Dermacerin [aquaphilic], Nsaids, Quinolones, Baclofen, Bactrim [sulfamethoxazole-trimethoprim], Ibuprofen, Lanolin, Latex, Librium [chlordiazepoxide], Naprosyn [naproxen], and Other   Social History   Socioeconomic History   Marital status: Married    Spouse name: Roise Emert   Number of children: 1   Years of education: Not on file   Highest education level: Some college, no degree  Occupational History   Occupation: retired  Tobacco Use   Smoking status: Former    Years: 56.00    Types: Cigarettes    Quit date: 07/14/2016    Years since quitting: 5.8   Smokeless tobacco: Never  Vaping Use   Vaping Use: Former  Substance and Sexual Activity   Alcohol use: Yes    Comment: Ecologist   Drug use: No   Sexual activity: Not Currently    Birth control/protection: Post-menopausal  Other Topics Concern   Not on file  Social History Narrative   Lives with husband, manages farm   Social Determinants of Health   Financial Resource Strain: [place]  (04/19/2021)   Overall Financial Resource Strain (CARDIA)    Difficulty of Paying Living Expenses: Not hard at all  Food Insecurity: No Food Insecurity (04/19/2021)   Hunger Vital Sign    Worried About Running Out of Food in the Last Year: Never true    [place] in the Last Year: Never true  Transportation Needs: No Transportation Needs (04/19/2021)   PRAPARE - Hydrologist (Medical): No    Lack of Transportation (Non-Medical): No  Physical Activity: Inactive (04/19/2021)   Exercise Vital Sign    Days of Exercise per Week: 0 days    Minutes of Exercise per Session: 0 min  Stress: Stress Concern  Present (04/19/2021)   [place]    Feeling of Stress : Rather much  Social Connections: Moderately Integrated (01/14/2018)   Social Connection and Isolation Panel [NHANES]    Frequency of Communication with Friends and Family: More than three times a week    Frequency of Social Gatherings with Friends and Family: Once a week    Attends Religious Services: 1 to 4 times per year    Active Member of [employer] or Organizations: No    Attends Archivist Meetings: Never    Marital Status: Married     Family History:  The patient's family  history includes Diabetes in her mother; Heart disease in her mother; Stroke in her maternal grandmother and mother.  ROS:   12-point review of systems is negative unless otherwise noted in the HPI.   EKGs/Labs/Other Studies Reviewed:    Studies reviewed were summarized above. The additional studies were reviewed today:  AAA ultrasound 02/27/2022 (vascular surgery): Summary:  Abdominal Aorta: The largest aortic measurement is 3.3 cm. Mild dilitation  again seen to the proximal abdominal aorta with normal tapering distally  to the distal aorta. Normal caliber CIA's bilaterally. Normal flow  throughout. The largest aortic diameter   remains essentially unchanged compared to prior exam. Previous diameter  measurement was 3.2 cm obtained on 08/2021.  __________  2D echo 12/25/2021: 1. Left ventricular ejection fraction, by estimation, is 55 to 60%. The  left ventricle has normal function. The left ventricle has no regional  wall motion abnormalities. Left ventricular diastolic parameters are  consistent with Grade II diastolic  dysfunction (pseudonormalization).   2. Right ventricular systolic function is normal. The right ventricular  size is normal.   3. The mitral valve is normal in structure. Trivial mitral valve  regurgitation.   4. The aortic valve was not well visualized.  Aortic valve regurgitation  is not visualized.   5. The inferior vena cava is normal in size with greater than 50%  respiratory variability, suggesting right atrial pressure of 3 mmHg. __________  2D echo 09/2018: - Left ventricle: The cavity size was normal. There was mild    concentric hypertrophy. Systolic function was normal. The    estimated ejection fraction was in the range of 55% to 60%. Wall    motion was normal; there were no regional wall motion    abnormalities. Doppler parameters are consistent with abnormal    left ventricular relaxation (grade 1 diastolic dysfunction).  - Pulmonary arteries: Systolic pressure was at the upper limits of    normal.  - Pericardium, extracardiac: A small pericardial effusion was    identified posterior to the heart. __________   TEE 12/2017: - Left ventricle: Systolic function was normal. The estimated    ejection fraction was in the range of 55% to 65%. Wall motion was    normal; there were no regional wall motion abnormalities.  - Left atrium: No evidence of thrombus in the atrial cavity or    appendage. No evidence of thrombus in the atrial cavity or    appendage.  - Right ventricle: Systolic function was normal.  - Right atrium: No evidence of thrombus in the atrial cavity or    appendage.  - Atrial septum: No defect or patent foramen ovale was identified.    Echo contrast study showed no right-to-left atrial level shunt,    at baseline or with provocation.   Impressions:   - No cardiac source of emboli was indentified.  __________   2D echo 12/2017: - Procedure narrative: Transthoracic echocardiography. Image    quality was suboptimal. The study was technically difficult.  - Left ventricle: The cavity size was normal. Systolic function was    vigorous. The estimated ejection fraction was 70%. Wall motion    was normal; there were no regional wall motion abnormalities.  - Aortic valve: Valve area (Vmax): 2.92 cm^2.    Impressions:   - No cardiac source of emboli was indentified.   EKG:  EKG is ordered today.  The EKG ordered today demonstrates NSR, 87 bpm, low voltage QRS, no acute ST-T changes  Recent Labs: 09/10/2021:  Magnesium 2.1; TSH 0.965 11/26/2021: Hemoglobin 13.5; Platelets 232 12/31/2021: ALT 17; BUN 16; Creatinine, Ser 1.15; Potassium 4.2; Sodium 142  Recent Lipid Panel    Component Value Date/Time   CHOL 203 (H) 12/31/2021 1403   TRIG 160 (H) 12/31/2021 1403   HDL 51 12/31/2021 1403   CHOLHDL 3.5 03/21/2021 1020   VLDL 14 03/21/2021 1020   LDLCALC 124 (H) 12/31/2021 1403   LDLDIRECT 142.3 (H) 03/21/2021 1024    PHYSICAL EXAM:    VS:  BP 130/72 (BP Location: Left Arm, Patient Position: Sitting, Cuff Size: Large)   Pulse 87   Ht 5' 8"  (1.727 m)   Wt 221 lb (100.2 kg)   LMP  (LMP Unknown)   SpO2 98%   BMI 33.60 kg/m   BMI: Body mass index is 33.6 kg/m.  Physical Exam Constitutional:      Appearance: She is well-developed.  HENT:     Head: Normocephalic and atraumatic.  Eyes:     General:        Right eye: No discharge.        Left eye: No discharge.  Neck:     Vascular: No JVD.  Cardiovascular:     Rate and Rhythm: Normal rate and regular rhythm.     Heart sounds: Normal heart sounds, S1 normal and S2 normal. Heart sounds not distant. No midsystolic click and no opening snap. No murmur heard.    No friction rub.  Pulmonary:     Effort: Pulmonary effort is normal. No respiratory distress.     Breath sounds: Normal breath sounds. No decreased breath sounds, wheezing or rales.  Chest:     Chest wall: No tenderness.  Abdominal:     General: There is no distension.  Musculoskeletal:     Cervical back: Normal range of motion.     Right lower leg: Edema present.     Left lower leg: Edema present.     Comments: Mild bilateral lower extremity swelling with chronic hyperpigmentation and woody appearance.  Skin:    General: Skin is warm and dry.     Nails: There is  no clubbing.  Neurological:     Mental Status: She is alert and oriented to person, place, and time.  Psychiatric:        Speech: Speech normal.        Behavior: Behavior normal.        Thought Content: Thought content normal.        Judgment: Judgment normal.     Wt Readings from Last 3 Encounters:  05/06/22 221 lb (100.2 kg)  02/27/22 212 lb 9.6 oz (96.4 kg)  12/31/21 211 lb (95.7 kg)     ASSESSMENT & PLAN:   Coronary artery calcification/aortic atherosclerosis/HLD: She is doing well and is without symptoms concerning for angina or decompensation.  LDL 124 in 12/2021.  Intolerant to statins, ezetimibe, and PCSK9 inhibitor.  Defer bempedoic acid/inclisiran due to concerns with prior medication intolerances and side effect profile.  Focus on lifestyle modification and heart healthy diet.  No indication for further ischemic testing at this time.  Dilated ascending thoracic aorta: Stable on most recent imaging in 07/2021.  This will be followed up on a lung cancer screening CT in 07/2022.  Continue optimal blood pressure control.  AAA: Stable on ultrasound in 02/2022.  Followed by vascular surgery.  History of CVA: No new deficits.  ILR showed no evidence of A-fib and has subsequently been explanted.  She remains on  clopidogrel per PCP.  Diastolic dysfunction: She appears euvolemic and well compensated.  No longer requiring loop diuretic.  Lymphedema: Stable to improved.  Continue compression stockings and pumps.    Disposition: F/u with Dr. Rockey Situ or an APP in 6 months.   Medication Adjustments/Labs and Tests Ordered: Current medicines are reviewed at length with the patient today.  Concerns regarding medicines are outlined above. Medication changes, Labs and Tests ordered today are summarized above and listed in the Patient Instructions accessible in Encounters.   Signed, Christell Faith, PA-C 05/06/2022 4:46 PM     [address] [place], [place] [address] [phone]

## 2022-05-06 ENCOUNTER — Ambulatory Visit (INDEPENDENT_AMBULATORY_CARE_PROVIDER_SITE_OTHER): Payer: Medicare Other | Admitting: Physician Assistant

## 2022-05-06 ENCOUNTER — Encounter: Payer: Self-pay | Admitting: Physician Assistant

## 2022-05-06 VITALS — BP 130/72 | HR 87 | Ht 68.0 in | Wt 221.0 lb

## 2022-05-06 DIAGNOSIS — I890 Lymphedema, not elsewhere classified: Secondary | ICD-10-CM | POA: Diagnosis not present

## 2022-05-06 DIAGNOSIS — I700 Atherosclerosis of aorta: Secondary | ICD-10-CM | POA: Diagnosis not present

## 2022-05-06 DIAGNOSIS — I77819 Aortic ectasia, unspecified site: Secondary | ICD-10-CM | POA: Diagnosis not present

## 2022-05-06 DIAGNOSIS — I2584 Coronary atherosclerosis due to calcified coronary lesion: Secondary | ICD-10-CM | POA: Diagnosis not present

## 2022-05-06 DIAGNOSIS — I714 Abdominal aortic aneurysm, without rupture, unspecified: Secondary | ICD-10-CM

## 2022-05-06 DIAGNOSIS — I2510 Atherosclerotic heart disease of native coronary artery without angina pectoris: Secondary | ICD-10-CM | POA: Diagnosis not present

## 2022-05-06 DIAGNOSIS — Z8673 Personal history of transient ischemic attack (TIA), and cerebral infarction without residual deficits: Secondary | ICD-10-CM

## 2022-05-06 DIAGNOSIS — E785 Hyperlipidemia, unspecified: Secondary | ICD-10-CM

## 2022-05-06 DIAGNOSIS — I5189 Other ill-defined heart diseases: Secondary | ICD-10-CM

## 2022-05-06 NOTE — Patient Instructions (Signed)
Medication Instructions:  No changes at this time.   *If you need a refill on your cardiac medications before your next appointment, please call your pharmacy*   Lab Work: None  If you have labs (blood work) drawn today and your tests are completely normal, you will receive your results only by: [place] (if you have MyChart) OR A paper copy in the mail If you have any lab test that is abnormal or we need to change your treatment, we will call you to review the results.   Testing/Procedures: None   Follow-Up: At [facility], you and your health needs are our priority.  As part of our continuing mission to provide you with exceptional heart care, we have created designated Provider Care Teams.  These Care Teams include your primary Cardiologist (physician) and Advanced Practice Providers (APPs -  Physician Assistants and Nurse Practitioners) who all work together to provide you with the care you need, when you need it.  Your next appointment:   6 month(s)  The format for your next appointment:   In Person  Provider:   Ida Rogue, MD or Christell Faith, PA-C       Important Information About Sugar

## 2022-05-28 ENCOUNTER — Ambulatory Visit: Payer: Self-pay

## 2022-05-28 NOTE — Telephone Encounter (Signed)
Routing to provider to advise.  

## 2022-05-28 NOTE — Telephone Encounter (Signed)
Summary: tick bite / Rx req   The patient found a tick on their right leg behind their knee yesterday 05/27/22   The patient has experienced no symptoms but shares that they have a history of cellulitis concerns   The patient is uncertain of how long the tick has been attached   The patient would like to be prescribed something preventative if needed      Chief Complaint: tick bite  slightly engorged Symptoms: soreness and slight redness where tick was removed Frequency: pt stated she thinks tick was attached for 3 days Pertinent Negatives: Patient denies fever, rash, red ring at site, joint pain or headache Disposition: '[]'$ ED /'[]'$ Urgent Care (no appt availability in office) / '[]'$ Appointment(In office/virtual)/ '[]'$  [facility]/ '[]'$ Home Care/ '[]'$ Refused Recommended Disposition /'[]'$ [facility]/ '[x]'$  Follow-up with PCP Additional Notes: Pt stated she would like to be prescribed a prophylaxis medication to avoid any possibility of infection. Pt stated she has lymphedema  to both legs and stated she is prone to cellulitis   Reason for Disposition  [1] Probable deer tick AND [2] attached 36 hours or more (or tick appears swollen, not flat) AND [3] occurred in an area where Lyme disease is common  Answer Assessment - Initial Assessment Questions 1. ATTACHED:  "Is the tick still on the skin?"  (e.g., yes, no, unsure)    no 2. ONSET - TICK STILL ATTACHED:  "How long do you think the tick has been on your skin?" (e.g., hours, days, unsure)  Note:  Is there a recent activity (camping, hiking) where the caller may have been exposed?     N/a 3. ONSET - TICK NOT STILL ATTACHED: "If the tick has been removed, how long do you think the tick was attached before you removed it?" (e.g., 5 hours, 2 days). "When was this?"     Slightly engorged 4. LOCATION: "Where is the tick bite located?" (e.g., arm, leg)     Right leg behind the knee 5. TYPE of TICK: "Is it a wood tick or a deer tick?"  (e.g., deer tick, wood tick; unsure)     Deer tick 6. SIZE of TICK: "How big is the tick?" (e.g., size of poppy seed, apple seed, watermelon seed; unsure) Note: Deer ticks can be the size of a poppy seed (nymph) or an apple seed (adult).       Very little  7. ENGORGED: "Did the tick look flat or engorged (full, swollen)?" (e.g., flat, engorged; unsure)     Slightly engorged 8. OTHER SYMPTOMS: "Do you have any other symptoms?" (e.g., fever, rash, redness at bite area, red ring around bite)     Sore at place where tick was removed 9. PREGNANCY: "Is there any chance you are pregnant?" "When was your last menstrual period?"     N/a  Protocols used: Tick Bite-A-AH

## 2022-05-29 ENCOUNTER — Ambulatory Visit (INDEPENDENT_AMBULATORY_CARE_PROVIDER_SITE_OTHER): Payer: Medicare Other

## 2022-05-29 DIAGNOSIS — E538 Deficiency of other specified B group vitamins: Secondary | ICD-10-CM | POA: Diagnosis not present

## 2022-05-29 NOTE — Telephone Encounter (Signed)
Lvm asking patient to call back to schedule an appointment 

## 2022-05-29 NOTE — Telephone Encounter (Signed)
Please schedule an office visit per Jolene. Can be with any provider.

## 2022-05-30 ENCOUNTER — Encounter: Payer: Self-pay | Admitting: Physician Assistant

## 2022-05-30 ENCOUNTER — Ambulatory Visit (INDEPENDENT_AMBULATORY_CARE_PROVIDER_SITE_OTHER): Payer: Medicare Other | Admitting: Physician Assistant

## 2022-05-30 VITALS — BP 116/69 | HR 79 | Wt 223.9 lb

## 2022-05-30 DIAGNOSIS — W57XXXA Bitten or stung by nonvenomous insect and other nonvenomous arthropods, initial encounter: Secondary | ICD-10-CM

## 2022-05-30 DIAGNOSIS — S80861A Insect bite (nonvenomous), right lower leg, initial encounter: Secondary | ICD-10-CM

## 2022-05-30 NOTE — Progress Notes (Signed)
Acute Office Visit   Patient: Sharon [name]   DOB: 10-02-1946   76 y.o. Female  MRN: [id number] Visit Date: 05/30/2022  Today's healthcare provider: Dani Gobble Tynesha Free, PA-C  Introduced myself to the patient as a Journalist, newspaper and provided education on APPs in clinical practice.    Chief Complaint  Patient presents with   Insect Bite    Patient states she was bit by a tick on her right leg, behind her knee on 05/27/22. Patient is not sure how long it was attached. Patient states area is sore and there is a red spot. Patient would like area looked at because she has a history of cellulitis and lymphedema.    Subjective    HPI HPI     Insect Bite    Additional comments: Patient states she was bit by a tick on her right leg, behind her knee on 05/27/22. Patient is not sure how long it was attached. Patient states area is sore and there is a red spot. Patient would like area looked at because she has a history of cellulitis and lymphedema.       Last edited by Louanna Raw, [place] on 05/30/2022  3:43 PM.       Tick bite  When was it discovered:Tuesday night- still small but becoming engorged  Removed: yes Any concerns for head still embedded: no  Rash: no, small isolated papule from bite behind knee of right leg Fever: None Flu-like symptoms: she has baseline allergies and emphysema so she has runny nose and coughing at baseline       Medications: Outpatient Medications Prior to Visit  Medication Sig   acetaminophen (TYLENOL) 500 MG tablet Take 500 mg by mouth every 6 (six) hours as needed.   albuterol (PROAIR HFA) 108 (90 Base) MCG/ACT inhaler Inhale 2 puffs into the lungs every 6 (six) hours as needed for wheezing or shortness of breath.   Blood Glucose Monitoring Suppl (ONETOUCH VERIO) w/Device KIT Utilize to check blood sugar twice a day, fasting in morning with goal < 130 and then 2 hours after a meal with goal <180.  Document and bring to visits.    Budeson-Glycopyrrol-Formoterol (BREZTRI AEROSPHERE) 160-9-4.8 MCG/ACT AERO Inhale 2 puffs into the lungs 2 (two) times daily.   Cholecalciferol 1.25 MG (50000 UT) TABS Take 1 tablet by mouth once a week.   citalopram (CELEXA) 20 MG tablet Take 1 tablet (20 mg total) by mouth as needed.   clopidogrel (PLAVIX) 75 MG tablet TAKE 1 TABLET(75 MG) BY MOUTH DAILY   Crisaborole (EUCRISA) 2 % OINT Apply 1 application. topically daily. Use for itching and inflammation at left leg   Cyanocobalamin 1000 MCG/ML KIT Inject 1,000 mcg as directed every 30 (thirty) days.   desoximetasone (TOPICORT) 0.05 % cream Apply topically 2 (two) times daily.   glucose blood (ONETOUCH VERIO) test strip UTILIIZE TO CHECK BLOOD SUGAR TWICE DAILY FASTING IN MORNING WITH GOAL<130 AND THAN 2 HOURS AFTER A MEAL WITH GGOAL<180   Lancets (ONETOUCH ULTRASOFT) lancets Utilize to check blood sugar twice a day, fasting in morning with goal < 130 and then 2 hours after a meal with goal <180.  Document and bring to visits.   loratadine (CLARITIN) 10 MG tablet Take 1 tablet (10 mg total) by mouth daily.   pantoprazole (PROTONIX) 40 MG tablet TAKE 1 TABLET(40 MG) BY MOUTH DAILY   penicillin v potassium (VEETID) 500 MG tablet Take 500 mg by  mouth 2 (two) times daily as needed.   Facility-Administered Medications Prior to Visit  Medication Dose Route Frequency Provider   cyanocobalamin ((VITAMIN B-12)) injection 1,000 mcg  1,000 mcg Intramuscular Q30 days [name], Jolene T, NP    Review of Systems  Constitutional:  Positive for fatigue (chronic). Negative for chills, diaphoresis and fever.  HENT:  Negative for congestion, rhinorrhea and sore throat.   Respiratory:  Positive for cough and shortness of breath (Chronic with COPD). Negative for wheezing.   Gastrointestinal:  Negative for diarrhea, nausea and vomiting.  Musculoskeletal:  Negative for myalgias.  Skin:  Negative for rash.       Tick bite    Neurological:  Negative for  dizziness and headaches.       Objective    BP 116/69   Pulse 79   Wt 223 lb 14.4 oz (101.6 kg)   LMP  (LMP Unknown)   SpO2 96%   BMI 34.04 kg/m    Physical Exam Vitals reviewed.  Constitutional:      General: She is awake.     Appearance: Normal appearance. She is well-developed and well-groomed.  HENT:     Head: Normocephalic and atraumatic.  Pulmonary:     Effort: Pulmonary effort is normal.  Musculoskeletal:     Cervical back: Normal range of motion and neck supple.  Skin:    General: Skin is warm.       Neurological:     General: No focal deficit present.     Mental Status: She is alert and oriented to person, place, and time.     GCS: GCS eye subscore is 4. GCS verbal subscore is 5. GCS motor subscore is 6.     Cranial Nerves: No dysarthria or facial asymmetry.     Motor: No tremor or atrophy.     Gait: Gait is intact.  Psychiatric:        Attention and Perception: Attention and perception normal.        Mood and Affect: Mood and affect normal.        Speech: Speech normal.        Behavior: Behavior normal. Behavior is cooperative.        Cognition and Memory: Cognition and memory normal.        No results found for any visits on 05/30/22.  Assessment & Plan      No follow-ups on file.      Problem List Items Addressed This Visit   None Visit Diagnoses     Tick bite of right lower leg, initial encounter    -  Primary Acute, new concern Reports small raised lesion where tick was attached, no flu-like symptoms or rash development Recommend watchful monitoring for now Reviewed signs and symptoms to look for in regards to tick-borne illness  Follow up as needed for progressing symptoms or concerns         No follow-ups on file.   I, Liam Cammarata E Meilech Virts, PA-C, have reviewed all documentation for this visit. The documentation on 05/30/22 for the exam, diagnosis, procedures, and orders are all accurate and complete.   Talitha Givens, MHS,  PA-C [facility]

## 2022-06-03 ENCOUNTER — Ambulatory Visit (INDEPENDENT_AMBULATORY_CARE_PROVIDER_SITE_OTHER): Payer: Medicare Other | Admitting: *Deleted

## 2022-06-03 DIAGNOSIS — Z1231 Encounter for screening mammogram for malignant neoplasm of breast: Secondary | ICD-10-CM | POA: Diagnosis not present

## 2022-06-03 DIAGNOSIS — Z780 Asymptomatic menopausal state: Secondary | ICD-10-CM | POA: Diagnosis not present

## 2022-06-03 DIAGNOSIS — Z0000 Encounter for general adult medical examination without abnormal findings: Secondary | ICD-10-CM | POA: Diagnosis not present

## 2022-06-03 NOTE — Progress Notes (Signed)
Subjective:   Sharon [name] is a 76 y.o. female who presents for Medicare Annual (Subsequent) preventive examination.  I connected with  [name] on 06/03/22 by a telephone enabled telemedicine application and verified that I am speaking with the correct person using two identifiers.   I discussed the limitations of evaluation and management by telemedicine. The patient expressed understanding and agreed to proceed.  Patient location: home  Provider location: Tele-Health-home    Review of Systems     Cardiac Risk Factors include: advanced age (>56mn, >>6women);family history of premature cardiovascular disease;sedentary lifestyle     Objective:    Today's Vitals   There is no height or weight on file to calculate BMI.     06/03/2022    8:51 AM 10/04/2021    4:32 PM 06/06/2021    2:42 PM 04/19/2021    1:11 PM 03/14/2021    7:08 AM 06/17/2020    9:53 PM 04/09/2020    8:35 AM  Advanced Directives  Does Patient Have a Medical Advance Directive? _0  No No  Would patient like information on creating a medical advance directive? No - Patient declined No - Patient declined   No - Patient declined      Current Medications (verified) Outpatient Encounter Medications as of 06/03/2022  Medication Sig   acetaminophen (TYLENOL) 500 MG tablet Take 500 mg by mouth every 6 (six) hours as needed.   albuterol (PROAIR HFA) 108 (90 Base) MCG/ACT inhaler Inhale 2 puffs into the lungs every 6 (six) hours as needed for wheezing or shortness of breath.   Blood Glucose Monitoring Suppl (ONETOUCH VERIO) w/Device KIT Utilize to check blood sugar twice a day, fasting in morning with goal < 130 and then 2 hours after a meal with goal <180.  Document and bring to visits.   Budeson-Glycopyrrol-Formoterol (BREZTRI AEROSPHERE) 160-9-4.8 MCG/ACT AERO Inhale 2 puffs into the lungs 2 (two) times daily.   Cholecalciferol 1.25 MG (50000 UT) TABS Take 1 tablet by mouth once a week.   citalopram  (CELEXA) 20 MG tablet Take 1 tablet (20 mg total) by mouth as needed.   clopidogrel (PLAVIX) 75 MG tablet TAKE 1 TABLET(75 MG) BY MOUTH DAILY   Crisaborole (EUCRISA) 2 % OINT Apply 1 application. topically daily. Use for itching and inflammation at left leg   Cyanocobalamin 1000 MCG/ML KIT Inject 1,000 mcg as directed every 30 (thirty) days.   desoximetasone (TOPICORT) 0.05 % cream Apply topically 2 (two) times daily.   glucose blood (ONETOUCH VERIO) test strip UTILIIZE TO CHECK BLOOD SUGAR TWICE DAILY FASTING IN MORNING WITH GOAL<130 AND THAN 2 HOURS AFTER A MEAL WITH GGOAL<180   Lancets (ONETOUCH ULTRASOFT) lancets Utilize to check blood sugar twice a day, fasting in morning with goal < 130 and then 2 hours after a meal with goal <180.  Document and bring to visits.   loratadine (CLARITIN) 10 MG tablet Take 1 tablet (10 mg total) by mouth daily.   pantoprazole (PROTONIX) 40 MG tablet TAKE 1 TABLET(40 MG) BY MOUTH DAILY   penicillin v potassium (VEETID) 500 MG tablet Take 500 mg by mouth 2 (two) times daily as needed.   Facility-Administered Encounter Medications as of 06/03/2022  Medication   cyanocobalamin ((VITAMIN B-12)) injection 1,000 mcg    Allergies (verified) Statins, Dermacerin [aquaphilic], Nsaids, Quinolones, Baclofen, Bactrim [sulfamethoxazole-trimethoprim], Ibuprofen, Lanolin, Latex, Librium [chlordiazepoxide], Naprosyn [naproxen], and Other   History: Past Medical History:  Diagnosis Date   Anxiety  Arthritis    hands, upper back   Asthma    COPD (chronic obstructive pulmonary disease) (HCC)    History of cervical cancer    Menopausal disorder    Osteoporosis    Pneumonia 1960   Spasm of abdominal muscles of right side    intermittent   Stroke ([place]) 2019   TMJ (dislocation of temporomandibular joint)    Wears dentures    partial lower   Past Surgical History:  Procedure Laterality Date   ABDOMINAL HYSTERECTOMY  1970's   bladder botox  2005   BLADDER  SUSPENSION  2004   CATARACT EXTRACTION W/PHACO Right 03/09/2018   Procedure: CATARACT EXTRACTION PHACO AND INTRAOCULAR LENS PLACEMENT ([place]) right;  Surgeon: Eulogio Bear, MD;  Location: [place];  Service: Ophthalmology;  Laterality: Right;  CALL CELL 1ST   CATARACT EXTRACTION W/PHACO Left 04/19/2018   Procedure: CATARACT EXTRACTION PHACO AND INTRAOCULAR LENS PLACEMENT (IOC)  LEFT;  Surgeon: Eulogio Bear, MD;  Location: [place];  Service: Ophthalmology;  Laterality: Left;   COLONOSCOPY WITH PROPOFOL N/A 12/13/2015   Procedure: COLONOSCOPY WITH PROPOFOL;  Surgeon: Lucilla Lame, MD;  Location: [place];  Service: Endoscopy;  Laterality: N/A;   COLONOSCOPY WITH PROPOFOL N/A 03/14/2021   Procedure: COLONOSCOPY WITH PROPOFOL;  Surgeon: Lucilla Lame, MD;  Location: [place];  Service: Endoscopy;  Laterality: N/A;  diabetic   LOOP RECORDER INSERTION N/A 01/07/2018   Procedure: LOOP RECORDER INSERTION;  Surgeon: Deboraha Sprang, MD;  Location: [place] [facility];  Service: Cardiovascular;  Laterality: N/A;   POLYPECTOMY N/A 12/13/2015   Procedure: POLYPECTOMY;  Surgeon: Lucilla Lame, MD;  Location: [place];  Service: Endoscopy;  Laterality: N/A;  SIGMOID COLON POLYPS X  5   POLYPECTOMY N/A 03/14/2021   Procedure: POLYPECTOMY;  Surgeon: Lucilla Lame, MD;  Location: [place];  Service: Endoscopy;  Laterality: N/A;   SHOULDER ARTHROSCOPY W/ ROTATOR CUFF REPAIR Right 1998   TEE WITHOUT CARDIOVERSION N/A 01/06/2018   Procedure: TRANSESOPHAGEAL ECHOCARDIOGRAM (TEE);  Surgeon: Minna Merritts, MD;  Location: [facility];  Service: Cardiovascular;  Laterality: N/A;   TONSILLECTOMY AND ADENOIDECTOMY     TOOTH EXTRACTION     July 2023   Family History  Problem Relation Age of Onset   Diabetes Mother    Heart disease Mother    Stroke Mother    Stroke Maternal Grandmother    Hyperlipidemia Neg Hx    Social History    Socioeconomic History   Marital status: Married    Spouse name: Tanekia Ryans   Number of children: 1   Years of education: Not on file   Highest education level: Some college, no degree  Occupational History   Occupation: retired  Tobacco Use   Smoking status: Former    Years: 56.00    Types: Cigarettes    Quit date: 07/14/2016    Years since quitting: 5.8   Smokeless tobacco: Never  Vaping Use   Vaping Use: Former  Substance and Sexual Activity   Alcohol use: Yes    Comment: Ecologist   Drug use: No   Sexual activity: Not Currently    Birth control/protection: Post-menopausal  Other Topics Concern   Not on file  Social History Narrative   Lives with husband, manages farm   Social Determinants of Health   Financial Resource Strain: [place]  (06/03/2022)   Overall Financial Resource Strain (CARDIA)    Difficulty of Paying Living Expenses:  Not hard at all  Food Insecurity: No Food Insecurity (06/03/2022)   Hunger Vital Sign    Worried About Running Out of Food in the Last Year: Never true    Ran Out of Food in the Last Year: Never true  Transportation Needs: No Transportation Needs (06/03/2022)   PRAPARE - Hydrologist (Medical): No    Lack of Transportation (Non-Medical): No  Physical Activity: Inactive (06/03/2022)   Exercise Vital Sign    Days of Exercise per Week: 0 days    Minutes of Exercise per Session: 0 min  Stress: No Stress Concern Present (06/03/2022)   [place]    Feeling of Stress : Only a little  Social Connections: Moderately Isolated (06/03/2022)   Social Connection and Isolation Panel [NHANES]    Frequency of Communication with Friends and Family: More than three times a week    Frequency of Social Gatherings with Friends and Family: Never    Attends Religious Services: Never    Printmaker: No    Attends Programme researcher, broadcasting/film/video: Never    Marital Status: Married    Tobacco Counseling Counseling given: Not Answered   Clinical Intake:  Pre-visit preparation completed: Yes  Pain : No/denies pain     Nutritional Risks: None  How often do you need to have someone help you when you read instructions, pamphlets, or other written materials from your doctor or pharmacy?: 1 - Never  Diabetic?  no  Interpreter Needed?: No  Information entered by :: Leroy Kennedy LPN   Activities of Daily Living    06/03/2022    8:52 AM 10/04/2021    4:32 PM  In your present state of health, do you have any difficulty performing the following activities:  Hearing? 0 0  Vision? 0 0  Difficulty concentrating or making decisions? 0 0  Walking or climbing stairs? 1 0  Dressing or bathing? 0 0  Doing errands, shopping? 0 0  Preparing Food and eating ? N   Using the Toilet? N   In the past six months, have you accidently leaked urine? Y   Do you have problems with loss of bowel control? N   Managing your Medications? N   Managing your Finances? N   Housekeeping or managing your Housekeeping? Y     Patient Care Team: [name], NP as PCP - General (Nurse Practitioner) Minna Merritts, MD as PCP - Cardiology (Cardiology) Deboraha Sprang, MD as PCP - Electrophysiology (Cardiology) Bary Castilla Forest Gleason, MD as Consulting Physician (General Surgery) Kathrine Haddock, NP as Nurse Practitioner (Nurse Practitioner) Minna Merritts, MD as Consulting Physician (Cardiology) Greg Cutter, LCSW as Social Worker (Licensed Clinical Social Worker) Vladimir Faster, [facility] (Inactive) as Pharmacist (Pharmacist)  Indicate any recent Medical Services you may have received from other than [facility] providers in the past year (date may be approximate).     Assessment:   This is a routine wellness examination for [facility].  Hearing/Vision screen Hearing Screening - Comments:: Does not wear hearing  Vision Screening  - Comments:: [place] [facility] Up to date  Dietary issues and exercise activities discussed: Current Exercise Habits: The patient does not participate in regular exercise at present, Intensity: Mild   Goals Addressed   None    Depression Screen    06/03/2022    9:24 AM 06/03/2022    8:57 AM 12/31/2021  1:44 PM 09/10/2021    2:43 PM 04/19/2021    1:12 PM 07/17/2020    1:48 PM 04/09/2020    8:38 AM  PHQ 2/9 Scores  PHQ - 2 Score 0 0 0 0 0 0 0  PHQ- 9 Score   _0 Fall Risk    06/03/2022    8:45 AM 05/30/2022    3:43 PM 04/19/2021    1:12 PM 07/17/2020    1:46 PM 04/09/2020    8:36 AM  Fall Risk   Falls in the past year? 0 0 0 0 0  Number falls in past yr: 0 0  0   Injury with Fall? 0 0  0   Risk for fall due to : Impaired mobility History of fall(s) Medication side effect;Impaired balance/gait No Fall Risks Impaired mobility;Medication side effect  Follow up Falls evaluation completed;Education provided;Falls prevention discussed Falls evaluation completed Falls evaluation completed;Education provided;Falls prevention discussed Falls evaluation completed Falls evaluation completed;Education provided;Falls prevention discussed    FALL RISK PREVENTION PERTAINING TO THE HOME:  Any stairs in or around the home? Yes  If so, are there any without handrails? No  Home free of loose throw rugs in walkways, pet beds, electrical cords, etc? Yes  Adequate lighting in your home to reduce risk of falls? Yes   ASSISTIVE DEVICES UTILIZED TO PREVENT FALLS:  Life alert? No  Use of a cane, walker or w/c? Yes  Grab bars in the bathroom? Yes  Shower chair or bench in shower? Yes  Elevated toilet seat or a handicapped toilet? Yes   TIMED UP AND GO:  Was the test performed? No .    Cognitive Function:        06/03/2022    8:48 AM 04/19/2021    1:20 PM 04/09/2020    8:44 AM 01/14/2018    9:53 AM  6CIT Screen  What Year? 0 points 0 points 0 points 0 points  What month? 0 points 0  points 0 points 0 points  What time? 0 points 0 points 0 points 0 points  Count back from 20 0 points 0 points 0 points 0 points  Months in reverse 0 points 0 points 0 points 0 points  Repeat phrase 0 points 0 points 0 points 0 points  Total Score 0 points 0 points 0 points 0 points    Immunizations Immunization History  Administered Date(s) Administered   Fluad Quad(high Dose 65+) 07/21/2019, 07/17/2020, 08/23/2021   Influenza, High Dose Seasonal PF 06/17/2018   Influenza-Unspecified 08/09/2015, 06/18/2016, 07/17/2017   PFIZER(Purple Top)SARS-COV-2 Vaccination 11/27/2019, 12/27/2019, 08/06/2020   Pneumococcal Conjugate-13 09/25/2014   Pneumococcal-Unspecified 04/07/2012   Td 09/25/2014    TDAP status: Up to date  Flu Vaccine status: Up to date  Pneumococcal vaccine status: Up to date  Covid-19 vaccine status: Information provided on how to obtain vaccines.   Qualifies for Shingles Vaccine? Yes   Zostavax completed No   Shingrix Completed?: No.    Education has been provided regarding the importance of this vaccine. Patient has been advised to call insurance company to determine out of pocket expense if they have not yet received this vaccine. Advised may also receive vaccine at local pharmacy or Health Dept. Verbalized acceptance and understanding.  Screening Tests Health Maintenance  Topic Date Due   FOOT EXAM  01/15/2022   OPHTHALMOLOGY EXAM  03/06/2022   INFLUENZA VACCINE  05/13/2022   Zoster Vaccines- Shingrix (1 of 2) 09/03/2022 (Originally  12/04/1964)   COVID-19 Vaccine (4 - Pfizer risk series) 06/24/2023 (Originally 10/01/2020)   HEMOGLOBIN A1C  07/03/2022   DEXA SCAN  08/05/2022   URINE MICROALBUMIN  01/01/2023   TETANUS/TDAP  09/25/2024   Hepatitis C Screening  Completed   HPV VACCINES  Aged Out   Pneumonia Vaccine 61+ Years old  Discontinued   COLONOSCOPY (Pts 45-93yr Insurance coverage will need to be confirmed)  Discontinued   Fecal DNA (Cologuard)   Discontinued    Health Maintenance  Health Maintenance Due  Topic Date Due   FOOT EXAM  01/15/2022   OPHTHALMOLOGY EXAM  03/06/2022   INFLUENZA VACCINE  05/13/2022    Colorectal cancer screening: No longer required.   Mammogram status: Ordered  . Pt provided with contact info and advised to call to schedule appt.   Bone Density status: Ordered  . Pt provided with contact info and advised to call to schedule appt.  Lung Cancer Screening: (Low Dose CT Chest recommended if Age 76-80years, 30 pack-year currently smoking OR have quit w/in 15years.) does qualify.   Lung Cancer Screening Referral: scheduled   Additional Screening:  Hepatitis C Screening: does not qualify; Completed 2017  Vision Screening: Recommended annual ophthalmology exams for early detection of glaucoma and other disorders of the eye. Is the patient up to date with their annual eye exam?  Yes  Who is the provider or what is the name of the office in which the patient attends annual eye exams[place] is not established with a provider, would they like to be referred to a provider to establish care? No .   Dental Screening: Recommended annual dental exams for proper oral hygiene  Community Resource Referral / Chronic Care Management: CRR required this visit?  No   CCM required this visit?  No      Plan:     I have personally reviewed and noted the following in the patient's chart:   Medical and social history Use of alcohol, tobacco or illicit drugs  Current medications and supplements including opioid prescriptions. Patient is not currently taking opioid prescriptions. Functional ability and status Nutritional status Physical activity Advanced directives List of other physicians Hospitalizations, surgeries, and ER visits in previous 12 months Vitals Screenings to include cognitive, depression, and falls Referrals and appointments  In addition, I have reviewed and discussed with  patient certain preventive protocols, quality metrics, and best practice recommendations. A written personalized care plan for preventive services as well as general preventive health recommendations were provided to patient.     JLeroy Kennedy LPN   [id number]  Nurse Notes:

## 2022-06-03 NOTE — Patient Instructions (Signed)
Sharon [name] , Thank you for taking time to come for your Medicare Wellness Visit. I appreciate your ongoing commitment to your health goals. Please review the following plan we discussed and let me know if I can assist you in the future.   Screening recommendations/referrals: Colonoscopy: no longer indicated Mammogram: Education provided Bone Density: Education provided Recommended yearly ophthalmology/optometry visit for glaucoma screening and checkup Recommended yearly dental visit for hygiene and checkup  Vaccinations: Influenza vaccine: up to date Pneumococcal vaccine: up to date Tdap vaccine: up to date Shingles vaccine: Education provided    Advanced directives: Education provided  Conditions/risks identified:   Next appointment: 07-01-2022 @ 1:00  B12 shot     07-04-2022 @ 1:20  [name]   Preventive Care 65 Years and Older, Female Preventive care refers to lifestyle choices and visits with your health care provider that can promote health and wellness. What does preventive care include? A yearly physical exam. This is also called an annual well check. Dental exams once or twice a year. Routine eye exams. Ask your health care provider how often you should have your eyes checked. Personal lifestyle choices, including: Daily care of your teeth and gums. Regular physical activity. Eating a healthy diet. Avoiding tobacco and drug use. Limiting alcohol use. Practicing safe sex. Taking low-dose aspirin every day. Taking vitamin and mineral supplements as recommended by your health care provider. What happens during an annual well check? The services and screenings done by your health care provider during your annual well check will depend on your age, overall health, lifestyle risk factors, and family history of disease. Counseling  Your health care provider may ask you questions about your: Alcohol use. Tobacco use. Drug use. Emotional well-being. Home and relationship  well-being. Sexual activity. Eating habits. History of falls. Memory and ability to understand (cognition). Work and work Statistician. Reproductive health. Screening  You may have the following tests or measurements: Height, weight, and BMI. Blood pressure. Lipid and cholesterol levels. These may be checked every 5 years, or more frequently if you are over 76 years old. Skin check. Lung cancer screening. You may have this screening every year starting at age 76 if you have a 30-pack-year history of smoking and currently smoke or have quit within the past 15 years. Fecal occult blood test (FOBT) of the stool. You may have this test every year starting at age 76. Flexible sigmoidoscopy or colonoscopy. You may have a sigmoidoscopy every 5 years or a colonoscopy every 10 years starting at age 76. Hepatitis C blood test. Hepatitis B blood test. Sexually transmitted disease (STD) testing. Diabetes screening. This is done by checking your blood sugar (glucose) after you have not eaten for a while (fasting). You may have this done every 1-3 years. Bone density scan. This is done to screen for osteoporosis. You may have this done starting at age 76. Mammogram. This may be done every 1-2 years. Talk to your health care provider about how often you should have regular mammograms. Talk with your health care provider about your test results, treatment options, and if necessary, the need for more tests. Vaccines  Your health care provider may recommend certain vaccines, such as: Influenza vaccine. This is recommended every year. Tetanus, diphtheria, and acellular pertussis (Tdap, Td) vaccine. You may need a Td booster every 10 years. Zoster vaccine. You may need this after age 76. Pneumococcal 13-valent conjugate (PCV13) vaccine. One dose is recommended after age 76. Pneumococcal polysaccharide (PPSV23) vaccine. One dose is recommended  after age 76. Talk to your health care provider about which  screenings and vaccines you need and how often you need them. This information is not intended to replace advice given to you by your health care provider. Make sure you discuss any questions you have with your health care provider. Document Released: 10/26/2015 Document Revised: 06/18/2016 Document Reviewed: 07/31/2015 Elsevier Interactive Patient Education  2017 [employer] Prevention in the Home Falls can cause injuries. They can happen to people of all ages. There are many things you can do to make your home safe and to help prevent falls. What can I do on the outside of my home? Regularly fix the edges of walkways and driveways and fix any cracks. Remove anything that might make you trip as you walk through a door, such as a raised step or threshold. Trim any bushes or trees on the path to your home. Use bright outdoor lighting. Clear any walking paths of anything that might make someone trip, such as rocks or tools. Regularly check to see if handrails are loose or broken. Make sure that both sides of any steps have handrails. Any raised decks and porches should have guardrails on the edges. Have any leaves, snow, or ice cleared regularly. Use sand or salt on walking paths during winter. Clean up any spills in your garage right away. This includes oil or grease spills. What can I do in the bathroom? Use night lights. Install grab bars by the toilet and in the tub and shower. Do not use towel bars as grab bars. Use non-skid mats or decals in the tub or shower. If you need to sit down in the shower, use a plastic, non-slip stool. Keep the floor dry. Clean up any water that spills on the floor as soon as it happens. Remove soap buildup in the tub or shower regularly. Attach bath mats securely with double-sided non-slip rug tape. Do not have throw rugs and other things on the floor that can make you trip. What can I do in the bedroom? Use night lights. Make sure that you have a  light by your bed that is easy to reach. Do not use any sheets or blankets that are too big for your bed. They should not hang down onto the floor. Have a firm chair that has side arms. You can use this for support while you get dressed. Do not have throw rugs and other things on the floor that can make you trip. What can I do in the kitchen? Clean up any spills right away. Avoid walking on wet floors. Keep items that you use a lot in easy-to-reach places. If you need to reach something above you, use a strong step stool that has a grab bar. Keep electrical cords out of the way. Do not use floor polish or wax that makes floors slippery. If you must use wax, use non-skid floor wax. Do not have throw rugs and other things on the floor that can make you trip. What can I do with my stairs? Do not leave any items on the stairs. Make sure that there are handrails on both sides of the stairs and use them. Fix handrails that are broken or loose. Make sure that handrails are as long as the stairways. Check any carpeting to make sure that it is firmly attached to the stairs. Fix any carpet that is loose or worn. Avoid having throw rugs at the top or bottom of the stairs. If you  do have throw rugs, attach them to the floor with carpet tape. Make sure that you have a light switch at the top of the stairs and the bottom of the stairs. If you do not have them, ask someone to add them for you. What else can I do to help prevent falls? Wear shoes that: Do not have high heels. Have rubber bottoms. Are comfortable and fit you well. Are closed at the toe. Do not wear sandals. If you use a stepladder: Make sure that it is fully opened. Do not climb a closed stepladder. Make sure that both sides of the stepladder are locked into place. Ask someone to hold it for you, if possible. Clearly mark and make sure that you can see: Any grab bars or handrails. First and last steps. Where the edge of each step  is. Use tools that help you move around (mobility aids) if they are needed. These include: Canes. Walkers. Scooters. Crutches. Turn on the lights when you go into a dark area. Replace any light bulbs as soon as they burn out. Set up your furniture so you have a clear path. Avoid moving your furniture around. If any of your floors are uneven, fix them. If there are any pets around you, be aware of where they are. Review your medicines with your doctor. Some medicines can make you feel dizzy. This can increase your chance of falling. Ask your doctor what other things that you can do to help prevent falls. This information is not intended to replace advice given to you by your health care provider. Make sure you discuss any questions you have with your health care provider. Document Released: 07/26/2009 Document Revised: 03/06/2016 Document Reviewed: 11/03/2014 Elsevier Interactive Patient Education  2017 [employer].

## 2022-06-19 ENCOUNTER — Encounter: Payer: Self-pay | Admitting: Nurse Practitioner

## 2022-06-19 ENCOUNTER — Telehealth: Payer: Self-pay

## 2022-06-19 NOTE — Telephone Encounter (Signed)
Copied from [place] [phone]. Topic: General - Other >> Jun 19, 2022  1:05 PM Leitha Schuller wrote: Reason for CRM: Caller states pt had a dental procedure and nitro dioxide was used as a Freight forwarder states pt needs a letter from the provider faxed to the dental office stating she is treating pt for anxiety in order for insurance to cover cost of gas  Please assist further  Dr. Alvie Heidelberg Phone [phone] Fax [phone]

## 2022-06-20 NOTE — Telephone Encounter (Signed)
Left message for patient with Sharon [name]'s recommendations. Advised patient to give our office a call back to discuss.    OK for PEC to give note if patient calls back.

## 2022-06-26 ENCOUNTER — Other Ambulatory Visit: Payer: Self-pay

## 2022-06-26 ENCOUNTER — Emergency Department: Payer: Medicare Other

## 2022-06-26 ENCOUNTER — Encounter: Payer: Self-pay | Admitting: Nurse Practitioner

## 2022-06-26 ENCOUNTER — Emergency Department
Admission: EM | Admit: 2022-06-26 | Discharge: 2022-06-26 | Disposition: A | Discharge status: Home / Self Care | Payer: Medicare Other | Attending: Emergency Medicine | Admitting: Emergency Medicine

## 2022-06-26 DIAGNOSIS — R200 Anesthesia of skin: Secondary | ICD-10-CM | POA: Diagnosis not present

## 2022-06-26 DIAGNOSIS — Z7902 Long term (current) use of antithrombotics/antiplatelets: Secondary | ICD-10-CM | POA: Insufficient documentation

## 2022-06-26 DIAGNOSIS — I639 Cerebral infarction, unspecified: Secondary | ICD-10-CM | POA: Diagnosis not present

## 2022-06-26 DIAGNOSIS — H9201 Otalgia, right ear: Secondary | ICD-10-CM | POA: Diagnosis not present

## 2022-06-26 DIAGNOSIS — Z8673 Personal history of transient ischemic attack (TIA), and cerebral infarction without residual deficits: Secondary | ICD-10-CM | POA: Diagnosis not present

## 2022-06-26 DIAGNOSIS — R2990 Unspecified symptoms and signs involving the nervous system: Secondary | ICD-10-CM

## 2022-06-26 DIAGNOSIS — J3489 Other specified disorders of nose and nasal sinuses: Secondary | ICD-10-CM | POA: Diagnosis not present

## 2022-06-26 DIAGNOSIS — R5383 Other fatigue: Secondary | ICD-10-CM | POA: Diagnosis not present

## 2022-06-26 DIAGNOSIS — R29818 Other symptoms and signs involving the nervous system: Secondary | ICD-10-CM | POA: Diagnosis not present

## 2022-06-26 DIAGNOSIS — Z79899 Other long term (current) drug therapy: Secondary | ICD-10-CM | POA: Diagnosis not present

## 2022-06-26 DIAGNOSIS — I6502 Occlusion and stenosis of left vertebral artery: Secondary | ICD-10-CM | POA: Insufficient documentation

## 2022-06-26 DIAGNOSIS — R202 Paresthesia of skin: Secondary | ICD-10-CM | POA: Insufficient documentation

## 2022-06-26 DIAGNOSIS — I672 Cerebral atherosclerosis: Secondary | ICD-10-CM | POA: Diagnosis not present

## 2022-06-26 DIAGNOSIS — R9431 Abnormal electrocardiogram [ECG] [EKG]: Secondary | ICD-10-CM | POA: Diagnosis not present

## 2022-06-26 LAB — DIFFERENTIAL
Abs Immature Granulocytes: 0.04 10*3/uL (ref 0.00–0.07)
Basophils Absolute: 0 10*3/uL (ref 0.0–0.1)
Basophils Relative: 0 %
Eosinophils Absolute: 0.1 10*3/uL (ref 0.0–0.5)
Eosinophils Relative: 1 %
Immature Granulocytes: 1 %
Lymphocytes Relative: 13 %
Lymphs Abs: 1.1 10*3/uL (ref 0.7–4.0)
Monocytes Absolute: 0.7 10*3/uL (ref 0.1–1.0)
Monocytes Relative: 9 %
Neutro Abs: 6.3 10*3/uL (ref 1.7–7.7)
Neutrophils Relative %: 76 %

## 2022-06-26 LAB — URINALYSIS, ROUTINE W REFLEX MICROSCOPIC
Bilirubin Urine: NEGATIVE
Glucose, UA: NEGATIVE mg/dL
Hgb urine dipstick: NEGATIVE
Ketones, ur: NEGATIVE mg/dL
Leukocytes,Ua: NEGATIVE
Nitrite: NEGATIVE
Protein, ur: NEGATIVE mg/dL
Specific Gravity, Urine: 1.017 (ref 1.005–1.030)
pH: 6 (ref 5.0–8.0)

## 2022-06-26 LAB — CBC
HCT: 46.3 % — ABNORMAL HIGH (ref 36.0–46.0)
Hemoglobin: 15.1 g/dL — ABNORMAL HIGH (ref 12.0–15.0)
MCH: 30 pg (ref 26.0–34.0)
MCHC: 32.6 g/dL (ref 30.0–36.0)
MCV: 92 fL (ref 80.0–100.0)
Platelets: 237 10*3/uL (ref 150–400)
RBC: 5.03 MIL/uL (ref 3.87–5.11)
RDW: 13.2 % (ref 11.5–15.5)
WBC: 8.3 10*3/uL (ref 4.0–10.5)
nRBC: 0 % (ref 0.0–0.2)

## 2022-06-26 LAB — COMPREHENSIVE METABOLIC PANEL
ALT: 18 U/L (ref 0–44)
AST: 27 U/L (ref 15–41)
Albumin: 4 g/dL (ref 3.5–5.0)
Alkaline Phosphatase: 73 U/L (ref 38–126)
Anion gap: 9 (ref 5–15)
BUN: 20 mg/dL (ref 8–23)
CO2: 28 mmol/L (ref 22–32)
Calcium: 9.3 mg/dL (ref 8.9–10.3)
Chloride: 104 mmol/L (ref 98–111)
Creatinine, Ser: 1.01 mg/dL — ABNORMAL HIGH (ref 0.44–1.00)
GFR, Estimated: 58 mL/min — ABNORMAL LOW (ref >60)
Glucose, Bld: 99 mg/dL (ref 70–99)
Potassium: 4.7 mmol/L (ref 3.5–5.1)
Sodium: 141 mmol/L (ref 135–145)
Total Bilirubin: 0.9 mg/dL (ref 0.3–1.2)
Total Protein: 7.4 g/dL (ref 6.5–8.1)

## 2022-06-26 LAB — CBG MONITORING, ED: Glucose-Capillary: 147 mg/dL — ABNORMAL HIGH (ref 70–99)

## 2022-06-26 LAB — PROTIME-INR
INR: 1 (ref 0.8–1.2)
Prothrombin Time: 13.4 seconds (ref 11.4–15.2)

## 2022-06-26 LAB — APTT: aPTT: 25 seconds (ref 24–36)

## 2022-06-26 LAB — ETHANOL: Alcohol, Ethyl (B): <10 mg/dL (ref <10)

## 2022-06-26 MED ORDER — SODIUM CHLORIDE 0.9% FLUSH
3.0000 mL | Freq: Once | INTRAVENOUS | Status: AC
  Administered 2022-06-26: 3 mL via INTRAVENOUS

## 2022-06-26 MED ORDER — LORAZEPAM 2 MG/ML IJ SOLN
0.5000 mg | Freq: Once | INTRAMUSCULAR | Status: AC
  Administered 2022-06-26: 0.5 mg via INTRAVENOUS
  Filled 2022-06-26: qty 1

## 2022-06-26 MED ORDER — GADOBUTROL 1 MMOL/ML IV SOLN
10.0000 mL | Freq: Once | INTRAVENOUS | Status: AC | PRN
  Administered 2022-06-26: 10 mL via INTRAVENOUS

## 2022-06-26 NOTE — Progress Notes (Signed)
   06/26/22 1200  Clinical Encounter Type  Visited With Patient and family together  Visit Type Initial;Code  Referral From Nurse  Consult/Referral To Chaplain   Chaplain responded to Code Stroke and provided support to husband as he waited for patient to return from CT.

## 2022-06-26 NOTE — ED Notes (Signed)
Pt up to restroom without assistance. Steady gate noted.

## 2022-06-26 NOTE — ED Provider Notes (Signed)
MRI without acute stroke. MRA showed stenosis of left vertebral artery. Discussed with Dr. Leonel Ramsay with neurology who had seen patient earlier. Felt patient could be discharged to follow up as outpatient.    Nance Pear, MD 06/26/22 2052

## 2022-06-26 NOTE — ED Triage Notes (Signed)
Pt states she started having numbness and tingling to the L side of her face and L hand- pt states it started at 11:15- pt does have a hx of stroke that affects her L side- pt does take plavix

## 2022-06-26 NOTE — Consult Note (Signed)
Neurology Consultation Reason for Consult: Left facial numbness Referring Physician: Ellender Hose, C  CC: Left facial numbness  History is obtained from: Patient  HPI: Sharon [name] is a 76 y.o. female with a history of previous stroke who presents with symptoms identical to her previous presentation.  She states that in March 2019, she had sudden onset of left-sided numbness and tingling.  Due to the symptoms she presented to the emergency department where she was found to have multiple right-sided strokes and was evaluated with MRI brain, MRA head, TEE, carotid ultrasound, loop recorder.  She is currently managed with Plavix.  Of note, she states that she woke up feeling kind of lethargic today, and her right ear has been hurting for a few days.  LKW: 11:15 AM tpa given?: no, mild symptoms  Past Medical History:  Diagnosis Date   Anxiety    Arthritis    hands, upper back   Asthma    COPD (chronic obstructive pulmonary disease) (HCC)    History of cervical cancer    Menopausal disorder    Osteoporosis    Pneumonia 1960   Spasm of abdominal muscles of right side    intermittent   Stroke ([place]) 2019   TMJ (dislocation of temporomandibular joint)    Wears dentures    partial lower     Family History  Problem Relation Age of Onset   Diabetes Mother    Heart disease Mother    Stroke Mother    Stroke Maternal Grandmother    Hyperlipidemia Neg Hx      Social History:  reports that she quit smoking about 5 years ago. Her smoking use included cigarettes. She has never used smokeless tobacco. She reports current alcohol use. She reports that she does not use drugs.   Exam: Current vital signs: BP (!) 157/86 (BP Location: Right Arm)   Pulse 95   Temp 98.2 F (36.8 C) (Oral)   Resp 18   Wt 103.6 kg   LMP  (LMP Unknown)   SpO2 97%   BMI 34.73 kg/m  Vital signs in last 24 hours: Temp:  [98.2 F (36.8 C)] 98.2 F (36.8 C) (09/14 1209) Pulse Rate:  [95] 95 (09/14  1209) Resp:  [18] 18 (09/14 1209) BP: (157)/(86) 157/86 (09/14 1209) SpO2:  [97 %] 97 % (09/14 1209) Weight:  [103.6 kg] 103.6 kg (09/14 1230)   Physical Exam  Constitutional: Appears well-developed and well-nourished.  Psych: Affect appropriate to situation Eyes: No scleral injection HENT: No OP obstruction MSK: no joint deformities.  Cardiovascular: Normal rate and regular rhythm.  Respiratory: Effort normal, non-labored breathing GI: Soft.  No distension. There is no tenderness.  Skin: WDI  Neuro: Mental Status: Patient is awake, alert, oriented to person, place, month, year, and situation. Patient is able to give a clear and coherent history. No signs of aphasia or neglect Cranial Nerves: II: Visual Fields are full. Pupils are equal, round, and reactive to light.   III,IV, VI: EOMI without ptosis or diploplia.  V: Facial sensation is diminished on the left VII: Facial movement is symmetric.  VIII: hearing is intact to voice X: Uvula elevates symmetrically XI: Shoulder shrug is symmetric. XII: tongue is midline without atrophy or fasciculations.  Motor: Tone is normal. Bulk is normal. 5/5 strength was present in all four extremities.  Sensory: Sensation is diminished on the left Cerebellar: No ataxia on finger-nose-finger or humiliation  I have reviewed labs in epic and the results pertinent to this  consultation are: Most recent lipid panel showed an LDL of 124  I have reviewed the images obtained: CT head-negative  Impression: 76 year old female with recurrence of symptoms that she had with her previous stroke.  Possibilities include recurrent infarct versus recrudescence of her previous symptoms.  Given her complaint of feeling lethargic, I think that this is significantly possible.  I would favor further imaging with MRI of the brain and MRA head and neck.  If no acute infarct, then I would favor continuing medical management, but she may need more aggressive statin  therapy.  Recommendations: 1) MRI brain, MRA head and neck 2) would consider reintroducing statin versus PSK9 inhibitor to target LDL less than 70 3) continue Plavix 75 mg daily 4) further recommendations following above imaging   Roland Rack, MD [facility] [phone]  If 7pm- 7am, please page neurology on call as listed in [place].

## 2022-06-26 NOTE — ED Notes (Signed)
Tele neuro chart activated, pt in CT at this time

## 2022-06-26 NOTE — ED Provider Notes (Signed)
[facility] Provider Note    Event Date/Time   First MD Initiated Contact with Patient 06/26/22 1225     (approximate)   History   Code Stroke   HPI  Sharon [name] is a 76 y.o. female with past medical history of anxiety, previous stroke, here with facial numbness and tingling as well as left arm tingling.  The patient states that just prior to arrival, she began to feel numbness and sensation that her arm was falling asleep.  This moved up to her arm and then to her face.  She states that she had similar symptoms when she had a stroke in 2019, so she became very concerned.  She looked in the mirror and did not see any facial asymmetry.  She now feels close to her baseline although she continues to have some mild tingling in her left face.  Patient denies any recent injuries or illness, though she does have some mild fatigue as well as sinus pressure.  She has had some mild pain in her right ear.  She has been taking her Plavix.     Physical Exam   Triage Vital Signs: ED Triage Vitals  Enc Vitals Group     BP 06/26/22 1209 (!) 157/86     Pulse Rate 06/26/22 1209 95     Resp 06/26/22 1209 18     Temp 06/26/22 1209 98.2 F (36.8 C)     Temp Source 06/26/22 1209 Oral     SpO2 06/26/22 1209 97 %     Weight 06/26/22 1230 228 lb 6.3 oz (103.6 kg)     Height --      Head Circumference --      Peak Flow --      Pain Score 06/26/22 1210 0     Pain Loc --      Pain Edu? --      Excl. in [place]? --     Most recent vital signs: Vitals:   06/26/22 1400 06/26/22 1500  BP: 127/68 125/66  Pulse: 89 88  Resp: 20 19  Temp:    SpO2: 93% 94%     General: Awake, no distress.  CV:  Good peripheral perfusion.  Resp:  Normal effort.  Abd:  No distention.  Other:  HEENT exam shows mild sinus tenderness over the maxillary and frontal sinus, serous effusions bilaterally but no erythema.  On neuro exam, cranial nerves II through XII fully intact.  She has subjective  tingling along the left lower jaw but otherwise normal sensation throughout the face.  Strength out of 5 bilateral upper and lower extremities.  Normal sensation light touch.  Gait deferred.   ED Results / Procedures / Treatments   Labs (all labs ordered are listed, but only abnormal results are displayed) Labs Reviewed  CBC - Abnormal; Notable for the following components:      Result Value   Hemoglobin 15.1 (*)    HCT 46.3 (*)    All other components within normal limits  COMPREHENSIVE METABOLIC PANEL - Abnormal; Notable for the following components:   Creatinine, Ser 1.01 (*)    GFR, Estimated 58 (*)    All other components within normal limits  CBG MONITORING, ED - Abnormal; Notable for the following components:   Glucose-Capillary 147 (*)    All other components within normal limits  PROTIME-INR  APTT  DIFFERENTIAL  ETHANOL  URINALYSIS, ROUTINE W REFLEX MICROSCOPIC  I-STAT CREATININE, ED  EKG Normal sinus rhythm, trickle rate 99.  PR 143, QRS 96, QTc 434.  No acute ST elevations or depressions.  No EKG evidence of acute ischemia or infarct.   RADIOLOGY CT head: No acute intracranial process   I also independently reviewed and agree with radiologist interpretations.   PROCEDURES:  Critical Care performed: No   MEDICATIONS ORDERED IN ED: Medications  sodium chloride flush (NS) 0.9 % injection 3 mL (3 mLs Intravenous Given 06/26/22 1234)     IMPRESSION / MDM / ASSESSMENT AND PLAN / ED COURSE  I reviewed the triage vital signs and the nursing notes.                               The patient is on the cardiac monitor to evaluate for evidence of arrhythmia and/or significant heart rate changes.   Ddx:  Differential includes the following, with pertinent life- or limb-threatening emergencies considered:  Recurrent CVA, TIA, recrudescence of old stroke in the setting of occult UTI, metabolic abnormality such as AKI, possible sinus infection, anemia,  anxiety  Patient's presentation is most consistent with acute presentation with potential threat to life or bodily function.  MDM:  76 year old female with history of anxiety, previous stroke, here with symptoms similar to her prior stroke.  Symptoms have largely resolved with the exception of some mild tingling of her left jaw.  Dr. Leonel Ramsay with neurology was immediately at bedside and the patient was activated as a code stroke.  CT head is negative.  Patient does not meet criteria for tPA.  Unclear whether this is recrudescence of old stroke, possible TIA, possibly anxiety related.  The patient does endorse some symptoms concerning for possible sinus infection, no evidence of sepsis.  CT head obtained, reviewed, and is negative.  Lab work very reassuring.  No leukocytosis.  Electrolytes within normal limits.  Alcohol negative.  Urinalysis is pending.  Per Dr. Leonel Ramsay, plan to obtain MRI.  If negative, no apparent indication for admission at this time.  Will plan to follow-up urinalysis as well.   MEDICATIONS GIVEN IN ED: Medications  sodium chloride flush (NS) 0.9 % injection 3 mL (3 mLs Intravenous Given 06/26/22 1234)     Consults:  Neurology Dr. Leonel Ramsay   EMR reviewed       FINAL CLINICAL IMPRESSION(S) / ED DIAGNOSES   Final diagnoses:  Stroke-like symptoms     Rx / DC Orders   ED Discharge Orders     None        Note:  This document was prepared using Dragon voice recognition software and may include unintentional dictation errors.   Duffy Bruce, MD 06/26/22 [phone]

## 2022-06-26 NOTE — ED Notes (Signed)
Pt to MRI

## 2022-06-26 NOTE — Progress Notes (Signed)
Code Stroke activated @ 1610 with pt already in CT.  Paged Kirkpatrick @ [phone].  Kirkpatrick in [place] with pt and on camera on return with pt to room @ 1226.  No TNK per Leonel Ramsay.    Jarold Song BSN, Horticulturist, commercial

## 2022-06-26 NOTE — Code Documentation (Addendum)
Stroke Response Nurse Documentation Code Documentation  Sharon [name] is a 76 y.o. female arriving to [facility] via [facility] on 06/26/2022 with past medical hx of COPD, cervical cancer, stroke. On clopidogrel 75 mg daily. Code stroke was activated by ED.   Patient from home where she was LKW at 11:15 and now complaining of left sided decreased sensation. Pt was eating lunch when she noticed decreased sensation on left side of face, arm, and leg and had her husband bring her to the ED.  Stroke team met patient in CT on arrival.  NIHSS 1, see documentation for details and code stroke times. Patient with left decreased sensation on exam. The following imaging was completed:  CT Head. Patient is not a candidate for IV Thrombolytic due to too mild to treat, per MD. Patient is not a candidate for IR due to no LVO suspected, per MD.   Care Plan: Q30 VS+NIHSS until pt outside window (15:45) followed by Q2 VS+NIHSS.  Bedside handoff with ED RN Philis Kendall.    [name]  Stroke Response RN

## 2022-06-26 NOTE — Discharge Instructions (Signed)
Please seek medical attention for any high fevers, chest pain, shortness of breath, change in behavior, persistent vomiting, bloody stool or any other new or concerning symptoms.  

## 2022-06-27 ENCOUNTER — Encounter: Payer: Self-pay | Admitting: Physician Assistant

## 2022-06-27 ENCOUNTER — Ambulatory Visit (INDEPENDENT_AMBULATORY_CARE_PROVIDER_SITE_OTHER): Payer: Medicare Other | Admitting: Physician Assistant

## 2022-06-27 ENCOUNTER — Ambulatory Visit: Payer: Self-pay

## 2022-06-27 VITALS — BP 120/76 | HR 93 | Temp 98.6°F | Ht 67.99 in | Wt 225.3 lb

## 2022-06-27 DIAGNOSIS — N3000 Acute cystitis without hematuria: Secondary | ICD-10-CM | POA: Diagnosis not present

## 2022-06-27 DIAGNOSIS — R309 Painful micturition, unspecified: Secondary | ICD-10-CM | POA: Diagnosis not present

## 2022-06-27 DIAGNOSIS — E538 Deficiency of other specified B group vitamins: Secondary | ICD-10-CM

## 2022-06-27 LAB — URINALYSIS, ROUTINE W REFLEX MICROSCOPIC
Bilirubin, UA: NEGATIVE
Glucose, UA: NEGATIVE
Nitrite, UA: NEGATIVE
Protein,UA: NEGATIVE
Specific Gravity, UA: >1.03 — ABNORMAL HIGH (ref 1.005–1.030)
Urobilinogen, Ur: 0.2 mg/dL (ref 0.2–1.0)
pH, UA: 5.5 (ref 5.0–7.5)

## 2022-06-27 LAB — MICROSCOPIC EXAMINATION

## 2022-06-27 MED ORDER — PHENAZOPYRIDINE HCL 100 MG PO TABS: 100.0000 mg | ORAL_TABLET | Freq: Three times a day (TID) | ORAL | 0 refills | Status: DC | PRN

## 2022-06-27 MED ORDER — FOSFOMYCIN TROMETHAMINE 3 G PO PACK: 3.0000 g | PACK | Freq: Once | ORAL | 0 refills | Status: AC

## 2022-06-27 NOTE — Progress Notes (Signed)
Acute Office Visit   Patient: Sharon [name]   DOB: May 18, 1946   76 y.o. Female  MRN: [id number] Visit Date: 06/27/2022  Today's healthcare provider: Dani Gobble Leverett Camplin, PA-C  Introduced myself to the patient as a Journalist, newspaper and provided education on APPs in clinical practice.    Chief Complaint  Patient presents with   Painful Urination    Patient started last night, painful urination and lower abd pain   Subjective    HPI HPI     Painful Urination    Additional comments: Patient started last night, painful urination and lower abd pain      Last edited by Jerelene Redden, CMA on 06/27/2022  3:31 PM.       Reports she woke up last night to urinate and had severe burning and hesitancy States she has suprapubic abdominal pain as well States this feels similar to when she had a UTI earlier this year States she has not had a kidney stone in the past   She was seen in the ED last night for potential stroke - UA was normal there  She has not taken any AZO    Medications: Outpatient Medications Prior to Visit  Medication Sig   acetaminophen (TYLENOL) 500 MG tablet Take 500 mg by mouth every 6 (six) hours as needed.   albuterol (PROAIR HFA) 108 (90 Base) MCG/ACT inhaler Inhale 2 puffs into the lungs every 6 (six) hours as needed for wheezing or shortness of breath.   Blood Glucose Monitoring Suppl (ONETOUCH VERIO) w/Device KIT Utilize to check blood sugar twice a day, fasting in morning with goal < 130 and then 2 hours after a meal with goal <180.  Document and bring to visits.   Budeson-Glycopyrrol-Formoterol (BREZTRI AEROSPHERE) 160-9-4.8 MCG/ACT AERO Inhale 2 puffs into the lungs 2 (two) times daily.   Cholecalciferol 1.25 MG (50000 UT) TABS Take 1 tablet by mouth once a week.   citalopram (CELEXA) 20 MG tablet Take 1 tablet (20 mg total) by mouth as needed.   clopidogrel (PLAVIX) 75 MG tablet TAKE 1 TABLET(75 MG) BY MOUTH DAILY   Crisaborole (EUCRISA) 2 % OINT Apply 1  application. topically daily. Use for itching and inflammation at left leg   Cyanocobalamin 1000 MCG/ML KIT Inject 1,000 mcg as directed every 30 (thirty) days.   desoximetasone (TOPICORT) 0.05 % cream Apply topically 2 (two) times daily.   glucose blood (ONETOUCH VERIO) test strip UTILIIZE TO CHECK BLOOD SUGAR TWICE DAILY FASTING IN MORNING WITH GOAL<130 AND THAN 2 HOURS AFTER A MEAL WITH GGOAL<180   Lancets (ONETOUCH ULTRASOFT) lancets Utilize to check blood sugar twice a day, fasting in morning with goal < 130 and then 2 hours after a meal with goal <180.  Document and bring to visits.   loratadine (CLARITIN) 10 MG tablet Take 1 tablet (10 mg total) by mouth daily.   pantoprazole (PROTONIX) 40 MG tablet TAKE 1 TABLET(40 MG) BY MOUTH DAILY   [DISCONTINUED] penicillin v potassium (VEETID) 500 MG tablet Take 500 mg by mouth 2 (two) times daily as needed.   Facility-Administered Medications Prior to Visit  Medication Dose Route Frequency Provider   cyanocobalamin ((VITAMIN B-12)) injection 1,000 mcg  1,000 mcg Intramuscular Q30 days [name], Henrine Screws T, NP    Review of Systems  Constitutional:  Negative for fever.  Gastrointestinal:  Positive for abdominal pain.  Genitourinary:  Positive for difficulty urinating and dysuria. Negative for flank pain, frequency,  hematuria and urgency.       Objective    BP 120/76   Pulse 93   Temp 98.6 F (37 C) (Oral)   Ht 5' 7.99" (1.727 m)   Wt 225 lb 4.8 oz (102.2 kg)   LMP  (LMP Unknown)   SpO2 96%   BMI 34.26 kg/m    Physical Exam Vitals reviewed.  Constitutional:      General: She is awake.     Appearance: Normal appearance. She is well-developed, well-groomed and overweight.  HENT:     Head: Normocephalic and atraumatic.  Eyes:     General: Lids are normal. Gaze aligned appropriately.     Extraocular Movements: Extraocular movements intact.     Conjunctiva/sclera: Conjunctivae normal.  Pulmonary:     Effort: Pulmonary effort is  normal.  Abdominal:     General: Abdomen is flat. Bowel sounds are normal.     Palpations: Abdomen is soft.     Tenderness: There is abdominal tenderness in the suprapubic area.  Musculoskeletal:     Cervical back: Normal range of motion and neck supple.  Neurological:     Mental Status: She is alert.  Psychiatric:        Attention and Perception: Attention and perception normal.        Mood and Affect: Mood and affect normal.        Speech: Speech normal.        Behavior: Behavior normal. Behavior is cooperative.       Results for orders placed or performed in visit on 06/27/22  Microscopic Examination   Urine  Result Value Ref Range   WBC, UA >30W 0 - 5 /hpf   RBC, Urine 0-2 0 - 2 /hpf   Epithelial Cells (non renal) 0-10 0 - 10 /hpf   Mucus, UA Present (A) Not Estab.   Bacteria, UA Few (A) None seen/Few  Urinalysis, Routine w reflex microscopic  Result Value Ref Range   Specific Gravity, UA >1.030 (H) 1.005 - 1.030   pH, UA 5.5 5.0 - 7.5   Color, UA Yellow Yellow   Appearance Ur Cloudy (A) Clear   Leukocytes,UA 2+ (A) Negative   Protein,UA Negative Negative/Trace   Glucose, UA Negative Negative   Ketones, UA Trace (A) Negative   RBC, UA 1+ (A) Negative   Bilirubin, UA Negative Negative   Urobilinogen, Ur 0.2 0.2 - 1.0 mg/dL   Nitrite, UA Negative Negative   Microscopic Examination See below:     Assessment & Plan      No follow-ups on file.       Problem List Items Addressed This Visit   None Visit Diagnoses     Acute cystitis without hematuria    -  Primary  Acute, new concern Reports dysuria, urinary hesitancy, suprapubic abdominal pain  Reviewed results of UA - indicative of acute cystitis  Will send for culture to assist with ID and susceptibility  Will send in fosfomycin 3 g single dose as well as pyridium for discomfort relief Follow up as needed for persistent or progressing symptoms    Relevant Medications   fosfomycin (MONUROL) 3 g PACK    Other Relevant Orders   Urine Culture   Painful urination       Relevant Medications   phenazopyridine (PYRIDIUM) 100 MG tablet   Other Relevant Orders   Urinalysis, Routine w reflex microscopic (Completed)        No follow-ups on file.   I, Dani Gobble  Momoka Stringfield, PA-C, have reviewed all documentation for this visit. The documentation on 06/27/22 for the exam, diagnosis, procedures, and orders are all accurate and complete.   Talitha Givens, MHS, PA-C [facility]

## 2022-06-27 NOTE — Telephone Encounter (Signed)
Pt states she has a bladder or kidney infection and requesting a rx   Please fu w/ pt     Chief Complaint: Pain with urination. Declines visit today, does not have transportation. Asking for medication to be called in. Symptoms: Above Frequency: Today Pertinent Negatives: Patient denies fever Disposition: '[]'$ ED /'[]'$ Urgent Care (no appt availability in office) / '[]'$ Appointment(In office/virtual)/ '[]'$  [place] [facility]/ '[]'$ Home Care/ '[]'$ Refused Recommended Disposition /'[]'$ [place] [facility]/ '[x]'$  Follow-up with PCP Additional Notes: Please advise pt.  Answer Assessment - Initial Assessment Questions 1. SYMPTOM: "What's the main symptom you're concerned about?" (e.g., frequency, incontinence)     Pain  2. ONSET: "When did the    start?"     Today 3. PAIN: "Is there any pain?" If Yes, ask: "How bad is it?" (Scale: 1-10; mild, moderate, severe)     Severe 4. CAUSE: "What do you think is causing the symptoms?"     UTI 5. OTHER SYMPTOMS: "Do you have any other symptoms?" (e.g., blood in urine, fever, flank pain, pain with urination)     No 6. PREGNANCY: "Is there any chance you are pregnant?" "When was your last menstrual period?"     NO  Protocols used: Urinary Symptoms-A-AH

## 2022-06-27 NOTE — Patient Instructions (Signed)
You can use a GoodRx coupon to help bring down the cost of your medication as needed Be sure to drink the entire dose of the Fosfomycin mixture  If you are having recurrent symptoms or persistent symptoms let us know at your next apt next week

## 2022-07-01 ENCOUNTER — Ambulatory Visit: Payer: Medicare Other

## 2022-07-02 ENCOUNTER — Encounter: Payer: Self-pay | Admitting: Emergency Medicine

## 2022-07-02 ENCOUNTER — Other Ambulatory Visit: Payer: Self-pay

## 2022-07-02 ENCOUNTER — Inpatient Hospital Stay
Admission: EM | Admit: 2022-07-02 | Discharge: 2022-07-04 | DRG: 065 | Disposition: A | Discharge status: Home Health | Payer: Medicare Other | Attending: Internal Medicine | Admitting: Internal Medicine

## 2022-07-02 ENCOUNTER — Emergency Department: Payer: Medicare Other

## 2022-07-02 DIAGNOSIS — E785 Hyperlipidemia, unspecified: Secondary | ICD-10-CM | POA: Diagnosis not present

## 2022-07-02 DIAGNOSIS — Z7951 Long term (current) use of inhaled steroids: Secondary | ICD-10-CM

## 2022-07-02 DIAGNOSIS — Z882 Allergy status to sulfonamides status: Secondary | ICD-10-CM | POA: Diagnosis not present

## 2022-07-02 DIAGNOSIS — J432 Centrilobular emphysema: Secondary | ICD-10-CM | POA: Diagnosis present

## 2022-07-02 DIAGNOSIS — Z87891 Personal history of nicotine dependence: Secondary | ICD-10-CM | POA: Diagnosis not present

## 2022-07-02 DIAGNOSIS — Z90710 Acquired absence of both cervix and uterus: Secondary | ICD-10-CM

## 2022-07-02 DIAGNOSIS — Z7902 Long term (current) use of antithrombotics/antiplatelets: Secondary | ICD-10-CM

## 2022-07-02 DIAGNOSIS — Z881 Allergy status to other antibiotic agents status: Secondary | ICD-10-CM | POA: Diagnosis not present

## 2022-07-02 DIAGNOSIS — I63411 Cerebral infarction due to embolism of right middle cerebral artery: Secondary | ICD-10-CM | POA: Diagnosis not present

## 2022-07-02 DIAGNOSIS — I6502 Occlusion and stenosis of left vertebral artery: Secondary | ICD-10-CM | POA: Diagnosis present

## 2022-07-02 DIAGNOSIS — I6782 Cerebral ischemia: Secondary | ICD-10-CM | POA: Diagnosis not present

## 2022-07-02 DIAGNOSIS — R29701 NIHSS score 1: Secondary | ICD-10-CM | POA: Diagnosis present

## 2022-07-02 DIAGNOSIS — Z823 Family history of stroke: Secondary | ICD-10-CM

## 2022-07-02 DIAGNOSIS — G319 Degenerative disease of nervous system, unspecified: Secondary | ICD-10-CM | POA: Diagnosis not present

## 2022-07-02 DIAGNOSIS — R4781 Slurred speech: Secondary | ICD-10-CM | POA: Diagnosis not present

## 2022-07-02 DIAGNOSIS — I2510 Atherosclerotic heart disease of native coronary artery without angina pectoris: Secondary | ICD-10-CM | POA: Diagnosis present

## 2022-07-02 DIAGNOSIS — R471 Dysarthria and anarthria: Secondary | ICD-10-CM | POA: Diagnosis not present

## 2022-07-02 DIAGNOSIS — G8321 Monoplegia of upper limb affecting right dominant side: Secondary | ICD-10-CM | POA: Diagnosis present

## 2022-07-02 DIAGNOSIS — Z8249 Family history of ischemic heart disease and other diseases of the circulatory system: Secondary | ICD-10-CM | POA: Diagnosis not present

## 2022-07-02 DIAGNOSIS — I639 Cerebral infarction, unspecified: Secondary | ICD-10-CM | POA: Diagnosis present

## 2022-07-02 DIAGNOSIS — M810 Age-related osteoporosis without current pathological fracture: Secondary | ICD-10-CM | POA: Diagnosis present

## 2022-07-02 DIAGNOSIS — I63511 Cerebral infarction due to unspecified occlusion or stenosis of right middle cerebral artery: Secondary | ICD-10-CM | POA: Diagnosis not present

## 2022-07-02 DIAGNOSIS — E1169 Type 2 diabetes mellitus with other specified complication: Secondary | ICD-10-CM | POA: Diagnosis present

## 2022-07-02 DIAGNOSIS — Z888 Allergy status to other drugs, medicaments and biological substances status: Secondary | ICD-10-CM | POA: Diagnosis not present

## 2022-07-02 DIAGNOSIS — F419 Anxiety disorder, unspecified: Secondary | ICD-10-CM | POA: Diagnosis present

## 2022-07-02 DIAGNOSIS — Z8541 Personal history of malignant neoplasm of cervix uteri: Secondary | ICD-10-CM

## 2022-07-02 DIAGNOSIS — Z6835 Body mass index (BMI) 35.0-35.9, adult: Secondary | ICD-10-CM

## 2022-07-02 DIAGNOSIS — R7303 Prediabetes: Secondary | ICD-10-CM | POA: Diagnosis not present

## 2022-07-02 DIAGNOSIS — Z91040 Latex allergy status: Secondary | ICD-10-CM

## 2022-07-02 DIAGNOSIS — Z886 Allergy status to analgesic agent status: Secondary | ICD-10-CM

## 2022-07-02 DIAGNOSIS — Z833 Family history of diabetes mellitus: Secondary | ICD-10-CM

## 2022-07-02 DIAGNOSIS — R1312 Dysphagia, oropharyngeal phase: Secondary | ICD-10-CM | POA: Diagnosis present

## 2022-07-02 DIAGNOSIS — E782 Mixed hyperlipidemia: Secondary | ICD-10-CM | POA: Diagnosis not present

## 2022-07-02 DIAGNOSIS — I69354 Hemiplegia and hemiparesis following cerebral infarction affecting left non-dominant side: Secondary | ICD-10-CM

## 2022-07-02 DIAGNOSIS — E669 Obesity, unspecified: Secondary | ICD-10-CM | POA: Diagnosis present

## 2022-07-02 DIAGNOSIS — Z79899 Other long term (current) drug therapy: Secondary | ICD-10-CM

## 2022-07-02 DIAGNOSIS — I5032 Chronic diastolic (congestive) heart failure: Secondary | ICD-10-CM | POA: Diagnosis not present

## 2022-07-02 DIAGNOSIS — I6389 Other cerebral infarction: Secondary | ICD-10-CM | POA: Diagnosis not present

## 2022-07-02 DIAGNOSIS — Z8673 Personal history of transient ischemic attack (TIA), and cerebral infarction without residual deficits: Secondary | ICD-10-CM | POA: Diagnosis not present

## 2022-07-02 LAB — CBC
HCT: 45.1 % (ref 36.0–46.0)
Hemoglobin: 14.8 g/dL (ref 12.0–15.0)
MCH: 30 pg (ref 26.0–34.0)
MCHC: 32.8 g/dL (ref 30.0–36.0)
MCV: 91.3 fL (ref 80.0–100.0)
Platelets: 233 10*3/uL (ref 150–400)
RBC: 4.94 MIL/uL (ref 3.87–5.11)
RDW: 13.1 % (ref 11.5–15.5)
WBC: 10 10*3/uL (ref 4.0–10.5)
nRBC: 0 % (ref 0.0–0.2)

## 2022-07-02 LAB — DIFFERENTIAL
Abs Immature Granulocytes: 0.04 10*3/uL (ref 0.00–0.07)
Basophils Absolute: 0 10*3/uL (ref 0.0–0.1)
Basophils Relative: 0 %
Eosinophils Absolute: 0.1 10*3/uL (ref 0.0–0.5)
Eosinophils Relative: 1 %
Immature Granulocytes: 0 %
Lymphocytes Relative: 15 %
Lymphs Abs: 1.5 10*3/uL (ref 0.7–4.0)
Monocytes Absolute: 0.9 10*3/uL (ref 0.1–1.0)
Monocytes Relative: 9 %
Neutro Abs: 7.4 10*3/uL (ref 1.7–7.7)
Neutrophils Relative %: 75 %

## 2022-07-02 LAB — CBG MONITORING, ED: Glucose-Capillary: 142 mg/dL — ABNORMAL HIGH (ref 70–99)

## 2022-07-02 LAB — COMPREHENSIVE METABOLIC PANEL
ALT: 17 U/L (ref 0–44)
AST: 20 U/L (ref 15–41)
Albumin: 4.1 g/dL (ref 3.5–5.0)
Alkaline Phosphatase: 75 U/L (ref 38–126)
Anion gap: 9 (ref 5–15)
BUN: 23 mg/dL (ref 8–23)
CO2: 24 mmol/L (ref 22–32)
Calcium: 9.3 mg/dL (ref 8.9–10.3)
Chloride: 106 mmol/L (ref 98–111)
Creatinine, Ser: 1.15 mg/dL — ABNORMAL HIGH (ref 0.44–1.00)
GFR, Estimated: 49 mL/min — ABNORMAL LOW (ref >60)
Glucose, Bld: 146 mg/dL — ABNORMAL HIGH (ref 70–99)
Potassium: 4.2 mmol/L (ref 3.5–5.1)
Sodium: 139 mmol/L (ref 135–145)
Total Bilirubin: 0.7 mg/dL (ref 0.3–1.2)
Total Protein: 7.4 g/dL (ref 6.5–8.1)

## 2022-07-02 LAB — PROTIME-INR
INR: 1 (ref 0.8–1.2)
Prothrombin Time: 13.1 seconds (ref 11.4–15.2)

## 2022-07-02 LAB — APTT: aPTT: 26 seconds (ref 24–36)

## 2022-07-02 LAB — ETHANOL: Alcohol, Ethyl (B): <10 mg/dL (ref <10)

## 2022-07-02 MED ORDER — CITALOPRAM HYDROBROMIDE 20 MG PO TABS: 20.0000 mg | ORAL_TABLET | Freq: Every day | ORAL | Status: DC | PRN

## 2022-07-02 MED ORDER — ALBUTEROL SULFATE HFA 108 (90 BASE) MCG/ACT IN AERS: 2.0000 | INHALATION_SPRAY | Freq: Four times a day (QID) | RESPIRATORY_TRACT | Status: DC | PRN

## 2022-07-02 MED ORDER — VITAMIN D (ERGOCALCIFEROL) 1.25 MG (50000 UNIT) PO CAPS
50000.0000 [IU] | ORAL_CAPSULE | ORAL | Status: DC
  Administered 2022-07-03: 50000 [IU] via ORAL
  Filled 2022-07-02: qty 1

## 2022-07-02 MED ORDER — PANTOPRAZOLE SODIUM 40 MG PO TBEC
40.0000 mg | DELAYED_RELEASE_TABLET | Freq: Every day | ORAL | Status: DC
  Administered 2022-07-03 – 2022-07-04 (×2): 40 mg via ORAL
  Filled 2022-07-02 (×2): qty 1

## 2022-07-02 MED ORDER — BUDESON-GLYCOPYRROL-FORMOTEROL 160-9-4.8 MCG/ACT IN AERO: 2.0000 | INHALATION_SPRAY | Freq: Two times a day (BID) | RESPIRATORY_TRACT | Status: DC

## 2022-07-02 MED ORDER — ACETAMINOPHEN 500 MG PO TABS
500.0000 mg | ORAL_TABLET | Freq: Four times a day (QID) | ORAL | Status: DC | PRN
  Administered 2022-07-03: 500 mg via ORAL
  Filled 2022-07-02: qty 1

## 2022-07-02 MED ORDER — LORAZEPAM 2 MG/ML IJ SOLN
0.5000 mg | Freq: Once | INTRAMUSCULAR | Status: AC
  Administered 2022-07-03: 0.5 mg via INTRAVENOUS
  Filled 2022-07-02: qty 1

## 2022-07-02 MED ORDER — STROKE: EARLY STAGES OF RECOVERY BOOK
Freq: Once | Status: AC
  Administered 2022-07-03: 1

## 2022-07-02 MED ORDER — CLOPIDOGREL BISULFATE 75 MG PO TABS
75.0000 mg | ORAL_TABLET | Freq: Every day | ORAL | Status: DC
  Administered 2022-07-03 – 2022-07-04 (×2): 75 mg via ORAL
  Filled 2022-07-02 (×2): qty 1

## 2022-07-02 MED ORDER — ALBUTEROL SULFATE (2.5 MG/3ML) 0.083% IN NEBU: 2.5000 mg | INHALATION_SOLUTION | Freq: Four times a day (QID) | RESPIRATORY_TRACT | Status: DC | PRN

## 2022-07-02 MED ORDER — SODIUM CHLORIDE 0.9% FLUSH: 3.0000 mL | Freq: Once | INTRAVENOUS | Status: DC

## 2022-07-02 NOTE — H&P (Signed)
History and Physical    Patient: Sharon [name] UXL:[id number] DOB: [birth year] DOA: 07/02/2022 DOS: the patient was seen and examined on 07/02/2022 PCP: [name], NP  Patient coming from: Home  Chief Complaint:  Chief Complaint  Patient presents with   Code Stroke   HPI: Sharon [name] is a 76 y.o. female with medical history significant for anxiety disorder, history of CVA with residual left-sided weakness, history of COPD who presents to the ER via private vehicle for symptoms concerning for stroke. Patient's last known well was about 7:30 PM on the night of admission when she developed slurred speech and also weakness involving her right upper extremity.  Per patient she had tried multiple times to plug her phone to the charger but kept dropping the phone.  She also tried to drink some coffee and dribbled the coffee all over herself.  She was brought into the ER by her husband for further evaluation. She was called in as a code stroke upon arrival to the ER and was seen by teleneurology.  Patient not a candidate for tPA due to findings on the CT scan of the head suggestive of an acute infarct.  Her symptoms have improved since her initial presentation. Patient was seen in the ED about a week ago for left-sided numbness and had extensive work-up.  She had an MRI of the brain which was negative for an acute infarct and an MRA which showed stenosis of the left vertebral artery.  She was discharged home to follow-up with neurology as an outpatient. She denies having any difficulty swallowing, no weakness, no headache, no blurred vision, no chest pain, no shortness of breath, no nausea, no vomiting, no abdominal pain, no changes in her bowel habits, no dizziness, no lightheadedness, no focal deficit. CT scan of the head without contrast shows focal hypodensity involving the right insular cortex, suspicious for an evolving acute right MCA distribution infarct. No intracranial hemorrhage.  ASPECTS is 9. Underlying atrophy with chronic small vessel ischemic disease.   Review of Systems: As mentioned in the history of present illness. All other systems reviewed and are negative. Past Medical History:  Diagnosis Date   Anxiety    Arthritis    hands, upper back   Asthma    COPD (chronic obstructive pulmonary disease) ([place])    History of cervical cancer    Menopausal disorder    Osteoporosis    Pneumonia 1960   Spasm of abdominal muscles of right side    intermittent   Stroke ([place]) 2019   TMJ (dislocation of temporomandibular joint)    Wears dentures    partial lower   Past Surgical History:  Procedure Laterality Date   ABDOMINAL HYSTERECTOMY  1970's   bladder botox  2005   BLADDER SUSPENSION  2004   CATARACT EXTRACTION W/PHACO Right 03/09/2018   Procedure: CATARACT EXTRACTION PHACO AND INTRAOCULAR LENS PLACEMENT ([place]) right;  Surgeon: Eulogio Bear, MD;  Location: [facility];  Service: Ophthalmology;  Laterality: Right;  CALL CELL 1ST   CATARACT EXTRACTION W/PHACO Left 04/19/2018   Procedure: CATARACT EXTRACTION PHACO AND INTRAOCULAR LENS PLACEMENT ([facility])  LEFT;  Surgeon: Eulogio Bear, MD;  Location: [place];  Service: Ophthalmology;  Laterality: Left;   COLONOSCOPY WITH PROPOFOL N/A 12/13/2015   Procedure: COLONOSCOPY WITH PROPOFOL;  Surgeon: Lucilla Lame, MD;  Location: [place];  Service: Endoscopy;  Laterality: N/A;   COLONOSCOPY WITH PROPOFOL N/A 03/14/2021   Procedure: COLONOSCOPY WITH PROPOFOL;  Surgeon: Lucilla Lame, MD;  Location: [facility];  Service: Endoscopy;  Laterality: N/A;  diabetic   LOOP RECORDER INSERTION N/A 01/07/2018   Procedure: LOOP RECORDER INSERTION;  Surgeon: Deboraha Sprang, MD;  Location: [facility];  Service: Cardiovascular;  Laterality: N/A;   POLYPECTOMY N/A 12/13/2015   Procedure: POLYPECTOMY;  Surgeon: Lucilla Lame, MD;  Location: [facility];  Service: Endoscopy;   Laterality: N/A;  SIGMOID COLON POLYPS X  5   POLYPECTOMY N/A 03/14/2021   Procedure: POLYPECTOMY;  Surgeon: Lucilla Lame, MD;  Location: [facility] [facility];  Service: Endoscopy;  Laterality: N/A;   SHOULDER ARTHROSCOPY W/ ROTATOR CUFF REPAIR Right 1998   TEE WITHOUT CARDIOVERSION N/A 01/06/2018   Procedure: TRANSESOPHAGEAL ECHOCARDIOGRAM (TEE);  Surgeon: Minna Merritts, MD;  Location: [facility];  Service: Cardiovascular;  Laterality: N/A;   TONSILLECTOMY AND ADENOIDECTOMY     TOOTH EXTRACTION     July 2023   Social History:  reports that she quit smoking about 5 years ago. Her smoking use included cigarettes. She has never used smokeless tobacco. She reports current alcohol use. She reports that she does not use drugs.  Allergies  Allergen Reactions   Statins Other (See Comments)    myalgia   Dermacerin [Aquaphilic]    Nsaids    Quinolones     FLUOROQUINOLONES - Pt reports she was told to NEVER take these.   Baclofen Swelling   Bactrim [Sulfamethoxazole-Trimethoprim] Nausea And Vomiting   Ibuprofen Rash    Mouth swelling   Lanolin Rash    Allergy to triamcinolone    Latex Rash    Some bandaids, some gloves; BLOOD TEST NEGATIVE   Librium [Chlordiazepoxide] Itching    Dizziness    Naprosyn [Naproxen] Rash    Mouth swelling   Other Rash    [place] nuts - mouth swelling    Family History  Problem Relation Age of Onset   Diabetes Mother    Heart disease Mother    Stroke Mother    Stroke Maternal Grandmother    Hyperlipidemia Neg Hx     Prior to Admission medications   Medication Sig Start Date End Date Taking? Authorizing Provider  acetaminophen (TYLENOL) 500 MG tablet Take 500 mg by mouth every 6 (six) hours as needed.    [provider]  albuterol (PROAIR HFA) 108 (90 Base) MCG/ACT inhaler Inhale 2 puffs into the lungs every 6 (six) hours as needed for wheezing or shortness of breath. 11/01/21   [name], Henrine Screws T, NP  Blood Glucose Monitoring Suppl (ONETOUCH  VERIO) w/Device KIT Utilize to check blood sugar twice a day, fasting in morning with goal < 130 and then 2 hours after a meal with goal <180.  Document and bring to visits. 07/31/20   [name], Henrine Screws T, NP  Budeson-Glycopyrrol-Formoterol (BREZTRI AEROSPHERE) 160-9-4.8 MCG/ACT AERO Inhale 2 puffs into the lungs 2 (two) times daily. 12/31/21   [name], Henrine Screws T, NP  Cholecalciferol 1.25 MG (50000 UT) TABS Take 1 tablet by mouth once a week. 11/01/21   [name], Henrine Screws T, NP  citalopram (CELEXA) 20 MG tablet Take 1 tablet (20 mg total) by mouth as needed. 11/01/21   [name], Henrine Screws T, NP  clopidogrel (PLAVIX) 75 MG tablet TAKE 1 TABLET(75 MG) BY MOUTH DAILY 12/23/21   [name], Henrine Screws T, NP  Crisaborole (EUCRISA) 2 % OINT Apply 1 application. topically daily. Use for itching and inflammation at left leg 03/04/22   Brendolyn Patty, MD  Cyanocobalamin 1000 MCG/ML KIT  Inject 1,000 mcg as directed every 30 (thirty) days.    [provider]  desoximetasone (TOPICORT) 0.05 % cream Apply topically 2 (two) times daily. 03/04/22   Brendolyn Patty, MD  glucose blood (ONETOUCH VERIO) test strip UTILIIZE TO CHECK BLOOD SUGAR TWICE DAILY FASTING IN MORNING WITH GOAL<130 AND THAN 2 HOURS AFTER A MEAL WITH GGOAL<180 03/28/22   [name], Henrine Screws T, NP  Lancets (ONETOUCH ULTRASOFT) lancets Utilize to check blood sugar twice a day, fasting in morning with goal < 130 and then 2 hours after a meal with goal <180.  Document and bring to visits. 07/31/20   [name], Henrine Screws T, NP  loratadine (CLARITIN) 10 MG tablet Take 1 tablet (10 mg total) by mouth daily. 11/01/21   [name], Henrine Screws T, NP  pantoprazole (PROTONIX) 40 MG tablet TAKE 1 TABLET(40 MG) BY MOUTH DAILY 11/01/21   [name], Henrine Screws T, NP  phenazopyridine (PYRIDIUM) 100 MG tablet Take 1 tablet (100 mg total) by mouth 3 (three) times daily as needed for pain. 06/27/22   MecumDani Gobble, PA-C    Physical Exam: Vitals:   07/02/22 2215 07/02/22 2230 07/02/22 2245 07/02/22 2300  BP:  (!) 148/77 135/76 125/67 117/61  Pulse: (!) 101 92 85 93  Resp: 19 20 18  (!) 21  Temp:      TempSrc:      SpO2: 96% 97% 97% 95%  Weight:      Height:       Physical Exam Vitals and nursing note reviewed.  Constitutional:      Appearance: Normal appearance.     Comments: Able to move all extremities  HENT:     Head: Normocephalic.     Nose: Nose normal.     Mouth/Throat:     Mouth: Mucous membranes are moist.  Eyes:     Conjunctiva/sclera: Conjunctivae normal.  Cardiovascular:     Rate and Rhythm: Normal rate and regular rhythm.  Pulmonary:     Effort: Pulmonary effort is normal.     Breath sounds: Normal breath sounds.  Abdominal:     General: Abdomen is flat. Bowel sounds are normal.     Palpations: Abdomen is soft.  Musculoskeletal:        General: Normal range of motion.     Cervical back: Normal range of motion and neck supple.  Skin:    General: Skin is warm and dry.  Neurological:     General: No focal deficit present.     Mental Status: She is alert and oriented to person, place, and time.  Psychiatric:        Mood and Affect: Mood normal.        Behavior: Behavior normal.     Data Reviewed: Relevant notes from primary care and specialist visits, past discharge summaries as available in EHR, including Care Everywhere. Prior diagnostic testing as pertinent to current admission diagnoses Updated medications and problem lists for reconciliation ED course, including vitals, labs, imaging, treatment and response to treatment Triage notes, nursing and pharmacy notes and ED provider's notes Notable results as noted in HPI Labs reviewed.  Sodium 139, potassium 4.2, chloride 106, bicarb 24, glucose 146, BUN 23, creatinine 1.15, calcium 9.3, total protein 7.4, albumin 4.1, AST 20, ALT 17, alk phos 75, PT 13.1, INR 1.0, white count 10, hemoglobin 14.8, hematocrit 45, platelet count 233 CT scan of the head without contrast shows focal hypodensity involving the right  insular cortex, suspicious for an evolving acute right MCA distribution infarct. No  intracranial hemorrhage. ASPECTS is 9. Underlying atrophy with chronic small vessel ischemic disease. Twelve-lead EKG reviewed by me shows sinus rhythm with low voltage precordial leads There are no new results to review at this time.  Assessment and Plan: * CVA (cerebral vascular accident) [name]) Patient presents for evaluation of slurred speech and right arm weakness Both symptoms have improved She was called in as a code stroke and initial CT scan of the head without contrast shows a focal hypodensity involving the right insular cortex, suspicious for an evolving acute right MCA distribution infarct. Patient was seen in the ER a week prior to this admission for left-sided numbness which resolved and was discharged home to follow-up with neurology as an outpatient. Continue Plavix Patient not on statins due to allergy Allow for permissive hypertension Obtain MRI of the brain without contrast as well MRA head without contrast as well as MRA neck with and without contrast Obtain 2D echocardiogram to assess LVEF and rule out cardiac thrombus Neurology consult    Obesity BMI 64.31 Complicates overall prognosis and care Lifestyle modification and exercise has been discussed with patient in detail  Type 2 diabetes mellitus with obesity (HCC) Diet controlled Maintain consistent carbohydrate diet Obtain hemoglobin A1c levels  Anxiety Stable Continue Celexa  Centrilobular emphysema (HCC) Stable and not acutely exacerbated Continue as needed bronchodilator therapy      Advance Care Planning:   Code Status: Full Code   Consults: Neurology  Family Communication: Greater than 50% of time was spent discussing patient's condition and plan of care with her and her husband at the bedside.  All questions and concerns have been addressed.  They verbalized understanding and agree with the plan.  Severity  of Illness: The appropriate patient status for this patient is OBSERVATION. Observation status is judged to be reasonable and necessary in order to provide the required intensity of service to ensure the patient's safety. The patient's presenting symptoms, physical exam findings, and initial radiographic and laboratory data in the context of their medical condition is felt to place them at decreased risk for further clinical deterioration. Furthermore, it is anticipated that the patient will be medically stable for discharge from the hospital within 2 midnights of admission.   Author: Collier Bullock, MD 07/02/2022 11:52 PM  For on call review [email or web address].

## 2022-07-02 NOTE — ED Triage Notes (Addendum)
Pt presents via POV with complaints of bilateral hand weakness, slurred speech, that started 2 hours ago. Pt states that she had a few episodes of expressive aphasia and slurred speech that started at [place] while watching tv. Seen here last week for the similar weakness in the left side. Pt takes plavix.  Denies CP or SOB.    CBG - 142

## 2022-07-02 NOTE — ED Notes (Signed)
Pt take to CT by this RN following EDP assessment.

## 2022-07-02 NOTE — Consult Note (Signed)
[facility] TeleNeurology Consult Services   Patient Name:   Sharon [name], Sharon [name] Date of Birth:   Jun 11, 1946 Identification Number:   MRN - [id number] Date of Service:   07/02/2022 21:46:54  Diagnosis:       R47.81 - Slurred speech  Impression:      76 year old female, history of diabetes, stroke in 2019 with residual left sided weakness, who presents with transient slurred speech and generalized weakness, last known well at 7:30 p.m. Head CT today shows a focal hypodensity in the right insular cortex suspicious for an evolving acute right MCA stroke. This hypodensity could very well correlate to her presentation of her left-sided paresthesias 6 days ago so patient is not a candidate for TNK due to concern for hemorrhagic conversion. She will be admitted for further workup. She does not need emergent CTA at this time as her NIHSS is only 1.    PLAN  - MRI brain and MRA head w/o contrast  - MRA neck w/ and w/o contrast  - TTE w/bubble  - check a1c and LDL  - monitor on tele for afib  - neuro to follow  ---  Our recommendations are outlined below.  Recommendations:        Stroke/Telemetry Floor       Neuro Checks       Bedside Swallow Eval       DVT Prophylaxis       IV Fluids, Normal Saline       Head of Bed 30 Degrees       Euglycemia and Avoid Hyperthermia (PRN Acetaminophen)   Sign Out:       Discussed with Emergency Department Provider    ------------------------------------------------------------------------------  Advanced Imaging: Advanced Imaging Deferred because:  Non-disabling symptoms as verified by the patient; no cortical signs so not consistent with LVO   Metrics: Last Known Well: 07/02/2022 19:30:00 [facility] Notification Time: 07/02/2022 15:40:08 Arrival Time: 07/02/2022 21:16:00 Stamp Time: 07/02/2022 21:46:54 Initial Response Time: 07/02/2022 21:48:59 Symptoms: slurred speech, generalized weakness. Initial patient  interaction: 07/02/2022 21:50:51 NIHSS Assessment Completed: 07/02/2022 22:05:09 Patient is not a candidate for Thrombolytic. Thrombolytic Medical Decision: 07/02/2022 22:06:07 Patient was not deemed candidate for Thrombolytic because of following reasons: evolving R MCA stroke on CT raises possibility that last week's presentation was an acute stroke, defer on tnk due to high risk of bleeding.  Primary Provider Notified of Diagnostic Impression and Management Plan on: 07/02/2022 22:21:18    ------------------------------------------------------------------------------  History of Present Illness: Patient is a 76 year old Female.  Patient was brought by private transportation with symptoms of slurred speech, generalized weakness.  76 year old female, history of diabetes, stroke in 2019 with residual left-sided weakness, who was last known well at 7:30 p.m. this evening. She developed new onset slurred speech and generalized weakness. Slurred speech lasted approximately 20 minutes and began to improve. By the time she arrived to the ER it had largely resolved but patient still reports that her speech does not sound clear. Of note, patient came to the ER on September 14th 2023 for complaints of left face and arm numbness and tingling. Her workup in the ER included a CT head, MRI brain, and MRA head and neck which did not show an acute stroke. There was left vertebral origin stenosis on the MRA. She reports that upon discharge from the ER she still had some residual left sided lip tingling but she no longer noticed it the next day. Currently her Stroke Scale is  one for subtle dysarthria. There is no evidence of weakness or numbness on the left side.   Past Medical History:      Diabetes Mellitus      Stroke  No Anticoagulant use  Antiplatelet use: Yes plavix Reviewed EMR for current medications  Allergies:  Reviewed  Social History: Drug Use: No  Family History: There is no family  history of premature cerebrovascular disease pertinent to this consultation  ROS : 14 Points Review of Systems was performed and was negative except mentioned in HPI.  Past Surgical History: There Is No Surgical History Contributory To Today's Visit    Examination: 1A: Level of Consciousness - Alert; keenly responsive + 0 1B: Ask Month and Age - Both Questions Right + 0 1C: Blink Eyes & Squeeze Hands - Performs Both Tasks + 0 2: Test Horizontal Extraocular Movements - Normal + 0 3: Test Visual Fields - No Visual Loss + 0 4: Test Facial Palsy (Use Grimace if Obtunded) - Normal symmetry + 0 5A: Test Left Arm Motor Drift - No Drift for 10 Seconds + 0 5B: Test Right Arm Motor Drift - No Drift for 10 Seconds + 0 6A: Test Left Leg Motor Drift - No Drift for 5 Seconds + 0 6B: Test Right Leg Motor Drift - No Drift for 5 Seconds + 0 7: Test Limb Ataxia (FNF/Heel-Shin) - No Ataxia + 0 8: Test Sensation - Normal; No sensory loss + 0 9: Test Language/Aphasia - Normal; No aphasia + 0 10: Test Dysarthria - Mild-Moderate Dysarthria: Slurring but can be understood + 1 11: Test Extinction/Inattention - No abnormality + 0  NIHSS Score: 1   Pre-Morbid Modified Rankin Scale: 0 Points = No symptoms at all  Spoke with : er doc  Patient/Family was informed the Neurology Consult would occur via TeleHealth consult by way of interactive audio and video telecommunications and consented to receiving care in this manner.   Patient is being evaluated for possible acute neurologic impairment and high probability of imminent or life-threatening deterioration. I spent total of 27 minutes providing care to this patient, including time for face to face visit via telemedicine, review of medical records, imaging studies and discussion of findings with providers, the patient and/or family.   Dr Burtis Junes   [facility] For Inpatient follow-up with [facility] physician please call [facility] [phone]. This  is not an outpatient service. Post hospital discharge, please contact hospital directly.

## 2022-07-02 NOTE — Progress Notes (Addendum)
Code stroke activated at 2145. Patient finishing up in CT.  MRS 0. Dr. Nicoletta Dress connected at 2150. CT results communicated to EDP by rad per report.   Callie Fielding, Telestroke RN

## 2022-07-02 NOTE — Assessment & Plan Note (Signed)
Stable ?Continue Celexa ?

## 2022-07-02 NOTE — ED Notes (Signed)
CODE STROKE called to  [facility] ([facility]) 

## 2022-07-02 NOTE — Assessment & Plan Note (Addendum)
Patient presents for evaluation of slurred speech and right arm weakness Both symptoms have improved, although weakness still present.  Patient found to have what looks to be an evolving acute right MCA distribution infarct.  Recently seen for left-sided numbness last week which resolved and was discharged home to follow-up with neurology as an outpatient.  CT angiogram of head and neck noting severe right MCA branch stenosis, and stroke felt to be atheroembolic from this location.  Patient already on Plavix due to intolerance of aspirin from bruising.  Discussed with patient and she is willing to try 81 mg of aspirin x90 days in addition to continuing Plavix indefinitely.  Echocardiogram unrevealing.  Not on statins due to allergy, will try Zetia.  On Plavix 2019 due to episodes of bruising with aspirin.  MRI of the brain noted confirming above CT findings.  A1c at 6.0.  Patient does not smoke.  Has always had stable blood pressure.  Patient found to still have some residual dysphagia although cleared to take p.o. so she will be followed by speech therapy at home health.  Also seen by PT and OT who are recommending home health.

## 2022-07-02 NOTE — ED Provider Notes (Signed)
[facility] Provider Note    Event Date/Time   First MD Initiated Contact with Patient 07/02/22 2150     (approximate)   History   Code Stroke   HPI  KORINE WINTON is a 76 y.o. female who presents to the emergency department today because of concerns for slurred speech.  Symptoms started around 730 tonight.  At the time my exam patient symptoms had improved.  She denies any headache.  She had been seen in the emergency department roughly 1 week ago for some left-sided weakness.  Had negative MRI at that time.     Physical Exam   Triage Vital Signs: ED Triage Vitals  Enc Vitals Group     BP 07/02/22 2126 (!) 167/84     Pulse Rate 07/02/22 2126 (!) 101     Resp 07/02/22 2126 16     Temp 07/02/22 2126 98.1 F (36.7 C)     Temp Source 07/02/22 2126 Oral     SpO2 07/02/22 2126 98 %     Weight 07/02/22 2142 225 lb 1.4 oz (102.1 kg)     Height 07/02/22 2142 '5\' 7"'$  (1.702 m)     Head Circumference --      Peak Flow --      Pain Score --      Pain Loc --      Pain Edu? --      Excl. in [place]? --     Most recent vital signs: Vitals:   07/02/22 2126  BP: (!) 167/84  Pulse: (!) 101  Resp: 16  Temp: 98.1 F (36.7 C)  SpO2: 98%    General: Awake, alert, oriented. CV:  Good peripheral perfusion. Regular rate and rhythm. Resp:  Normal effort. Lungs clear. Abd:  No distention.  Other:  Slight slurred speech.   ED Results / Procedures / Treatments   Labs (all labs ordered are listed, but only abnormal results are displayed) Labs Reviewed  COMPREHENSIVE METABOLIC PANEL - Abnormal; Notable for the following components:      Result Value   Glucose, Bld 146 (*)    Creatinine, Ser 1.15 (*)    GFR, Estimated 49 (*)    All other components within normal limits  CBG MONITORING, ED - Abnormal; Notable for the following components:   Glucose-Capillary 142 (*)    All other components within normal limits  PROTIME-INR  APTT  CBC  DIFFERENTIAL   ETHANOL  CBG MONITORING, ED     EKG  I, Nance Pear, attending physician, personally viewed and interpreted this EKG  EKG Time: 2149 Rate: 94 Rhythm: sinus rhythm Axis: normal Intervals: qtc 396 QRS: narrow, low voltage precordial leads ST changes: no st elevation Impression: abnormal ekg    RADIOLOGY I independently interpreted and visualized the CT head. My interpretation: No bleed Radiology interpretation:  IMPRESSION:  1. Focal hypodensity involving the right insular cortex, suspicious  for an evolving acute right MCA distribution infarct. No  intracranial hemorrhage.  2. ASPECTS is 9.  3. Underlying atrophy with chronic small vessel ischemic disease.   I, Nance Pear, personally discussed these images (CT head) and results by phone with the on-call radiologist and used this discussion as part of my medical decision making.      PROCEDURES:  Critical Care performed: No  Procedures   MEDICATIONS ORDERED IN ED: Medications  sodium chloride flush (NS) 0.9 % injection 3 mL (has no administration in time range)     IMPRESSION /  MDM / ASSESSMENT AND PLAN / ED COURSE  I reviewed the triage vital signs and the nursing notes.                              Differential diagnosis includes, but is not limited to, CVA, complex migraine.  Patient's presentation is most consistent with acute presentation with potential threat to life or bodily function.  Patient presented to the emergency department today because of concerns for slurred speech.  At the time my exam her speech had improved.  CT head is concerning for a focal lesion.  Patient was called a code stroke and neurology did evaluate.  At this time given they had some concern that lesion might have been from the symptoms patient had 1 week ago they did not recommend thrombolytics.  Discussed with Dr. Francine Graven with the hospitalist service who will plan on admission.   FINAL CLINICAL IMPRESSION(S) / ED  DIAGNOSES   Final diagnoses:  Slurred speech  Cerebrovascular accident (CVA), unspecified mechanism ([place])       Note:  This document was prepared using Dragon voice recognition software and may include unintentional dictation errors.    Nance Pear, MD 07/02/22 [phone]

## 2022-07-02 NOTE — Assessment & Plan Note (Addendum)
Counseled about weight loss.  Patient meets criteria with BMI greater than 35 and comorbidities of CVA

## 2022-07-02 NOTE — Assessment & Plan Note (Signed)
Stable and not acutely exacerbated Continue as needed bronchodilator therapy 

## 2022-07-02 NOTE — Assessment & Plan Note (Addendum)
Diet controlled Maintain consistent carbohydrate diet Obtain hemoglobin A1c levels

## 2022-07-03 ENCOUNTER — Other Ambulatory Visit (HOSPITAL_COMMUNITY): Payer: Self-pay

## 2022-07-03 ENCOUNTER — Observation Stay (HOSPITAL_COMMUNITY)
Admit: 2022-07-03 | Discharge: 2022-07-03 | Disposition: A | Discharge status: Home / Self Care | Payer: Medicare Other | Attending: Internal Medicine | Admitting: Internal Medicine

## 2022-07-03 ENCOUNTER — Inpatient Hospital Stay: Payer: Medicare Other

## 2022-07-03 ENCOUNTER — Observation Stay: Payer: Medicare Other

## 2022-07-03 ENCOUNTER — Telehealth (HOSPITAL_COMMUNITY): Payer: Self-pay | Admitting: Pharmacy Technician

## 2022-07-03 ENCOUNTER — Other Ambulatory Visit: Payer: Self-pay

## 2022-07-03 DIAGNOSIS — E782 Mixed hyperlipidemia: Secondary | ICD-10-CM | POA: Diagnosis not present

## 2022-07-03 DIAGNOSIS — Z8541 Personal history of malignant neoplasm of cervix uteri: Secondary | ICD-10-CM | POA: Diagnosis not present

## 2022-07-03 DIAGNOSIS — I2510 Atherosclerotic heart disease of native coronary artery without angina pectoris: Secondary | ICD-10-CM | POA: Diagnosis present

## 2022-07-03 DIAGNOSIS — Z882 Allergy status to sulfonamides status: Secondary | ICD-10-CM | POA: Diagnosis not present

## 2022-07-03 DIAGNOSIS — I6389 Other cerebral infarction: Secondary | ICD-10-CM

## 2022-07-03 DIAGNOSIS — I671 Cerebral aneurysm, nonruptured: Secondary | ICD-10-CM | POA: Diagnosis not present

## 2022-07-03 DIAGNOSIS — I63411 Cerebral infarction due to embolism of right middle cerebral artery: Secondary | ICD-10-CM | POA: Diagnosis not present

## 2022-07-03 DIAGNOSIS — Z886 Allergy status to analgesic agent status: Secondary | ICD-10-CM | POA: Diagnosis not present

## 2022-07-03 DIAGNOSIS — I69354 Hemiplegia and hemiparesis following cerebral infarction affecting left non-dominant side: Secondary | ICD-10-CM | POA: Diagnosis not present

## 2022-07-03 DIAGNOSIS — I679 Cerebrovascular disease, unspecified: Secondary | ICD-10-CM | POA: Diagnosis not present

## 2022-07-03 DIAGNOSIS — Z888 Allergy status to other drugs, medicaments and biological substances status: Secondary | ICD-10-CM | POA: Diagnosis not present

## 2022-07-03 DIAGNOSIS — F419 Anxiety disorder, unspecified: Secondary | ICD-10-CM | POA: Diagnosis present

## 2022-07-03 DIAGNOSIS — R471 Dysarthria and anarthria: Secondary | ICD-10-CM | POA: Diagnosis present

## 2022-07-03 DIAGNOSIS — I5032 Chronic diastolic (congestive) heart failure: Secondary | ICD-10-CM | POA: Diagnosis not present

## 2022-07-03 DIAGNOSIS — Z87891 Personal history of nicotine dependence: Secondary | ICD-10-CM | POA: Diagnosis not present

## 2022-07-03 DIAGNOSIS — I6502 Occlusion and stenosis of left vertebral artery: Secondary | ICD-10-CM | POA: Diagnosis present

## 2022-07-03 DIAGNOSIS — Z833 Family history of diabetes mellitus: Secondary | ICD-10-CM | POA: Diagnosis not present

## 2022-07-03 DIAGNOSIS — I63511 Cerebral infarction due to unspecified occlusion or stenosis of right middle cerebral artery: Secondary | ICD-10-CM | POA: Diagnosis not present

## 2022-07-03 DIAGNOSIS — R4781 Slurred speech: Secondary | ICD-10-CM | POA: Diagnosis not present

## 2022-07-03 DIAGNOSIS — R1312 Dysphagia, oropharyngeal phase: Secondary | ICD-10-CM | POA: Diagnosis present

## 2022-07-03 DIAGNOSIS — E785 Hyperlipidemia, unspecified: Secondary | ICD-10-CM | POA: Diagnosis present

## 2022-07-03 DIAGNOSIS — I639 Cerebral infarction, unspecified: Secondary | ICD-10-CM | POA: Diagnosis not present

## 2022-07-03 DIAGNOSIS — I63233 Cerebral infarction due to unspecified occlusion or stenosis of bilateral carotid arteries: Secondary | ICD-10-CM | POA: Diagnosis not present

## 2022-07-03 DIAGNOSIS — R7303 Prediabetes: Secondary | ICD-10-CM

## 2022-07-03 DIAGNOSIS — R29701 NIHSS score 1: Secondary | ICD-10-CM | POA: Diagnosis present

## 2022-07-03 DIAGNOSIS — J432 Centrilobular emphysema: Secondary | ICD-10-CM | POA: Diagnosis present

## 2022-07-03 DIAGNOSIS — I6521 Occlusion and stenosis of right carotid artery: Secondary | ICD-10-CM | POA: Diagnosis not present

## 2022-07-03 DIAGNOSIS — I63311 Cerebral infarction due to thrombosis of right middle cerebral artery: Secondary | ICD-10-CM | POA: Diagnosis not present

## 2022-07-03 DIAGNOSIS — Z881 Allergy status to other antibiotic agents status: Secondary | ICD-10-CM | POA: Diagnosis not present

## 2022-07-03 DIAGNOSIS — Z8673 Personal history of transient ischemic attack (TIA), and cerebral infarction without residual deficits: Secondary | ICD-10-CM | POA: Diagnosis not present

## 2022-07-03 DIAGNOSIS — Z8249 Family history of ischemic heart disease and other diseases of the circulatory system: Secondary | ICD-10-CM | POA: Diagnosis not present

## 2022-07-03 DIAGNOSIS — Z91040 Latex allergy status: Secondary | ICD-10-CM | POA: Diagnosis not present

## 2022-07-03 DIAGNOSIS — G8321 Monoplegia of upper limb affecting right dominant side: Secondary | ICD-10-CM | POA: Diagnosis present

## 2022-07-03 LAB — LIPID PANEL
Cholesterol: 171 mg/dL (ref 0–200)
HDL: 47 mg/dL (ref >40)
LDL Cholesterol: 110 mg/dL — ABNORMAL HIGH (ref 0–99)
Total CHOL/HDL Ratio: 3.6 RATIO
Triglycerides: 72 mg/dL (ref <150)
VLDL: 14 mg/dL (ref 0–40)

## 2022-07-03 LAB — ECHOCARDIOGRAM COMPLETE
AR max vel: 2.24 cm2
AV Area VTI: 1.95 cm2
AV Area mean vel: 2.1 cm2
AV Mean grad: 2 mmHg
AV Peak grad: 4.2 mmHg
Ao pk vel: 1.02 m/s
Area-P 1/2: 3.74 cm2
Height: 67 in
S' Lateral: 3.7 cm
Weight: 3601.43 oz

## 2022-07-03 LAB — CBG MONITORING, ED
Glucose-Capillary: 122 mg/dL — ABNORMAL HIGH (ref 70–99)
Glucose-Capillary: 147 mg/dL — ABNORMAL HIGH (ref 70–99)

## 2022-07-03 LAB — HEMOGLOBIN A1C
Hgb A1c MFr Bld: 6.4 % — ABNORMAL HIGH (ref 4.8–5.6)
Mean Plasma Glucose: 136.98 mg/dL

## 2022-07-03 LAB — GLUCOSE, CAPILLARY
Glucose-Capillary: 97 mg/dL (ref 70–99)
Glucose-Capillary: 148 mg/dL — ABNORMAL HIGH (ref 70–99)

## 2022-07-03 MED ORDER — GADOPICLENOL 0.5 MMOL/ML IV SOLN
10.0000 mL | Freq: Once | INTRAVENOUS | Status: AC | PRN
  Administered 2022-07-03: 10 mL via INTRAVENOUS

## 2022-07-03 MED ORDER — IOHEXOL 350 MG/ML SOLN
75.0000 mL | Freq: Once | INTRAVENOUS | Status: AC | PRN
  Administered 2022-07-03: 75 mL via INTRAVENOUS

## 2022-07-03 MED ORDER — UMECLIDINIUM BROMIDE 62.5 MCG/ACT IN AEPB
1.0000 | INHALATION_SPRAY | Freq: Every day | RESPIRATORY_TRACT | Status: DC
  Filled 2022-07-03: qty 7

## 2022-07-03 MED ORDER — MOMETASONE FURO-FORMOTEROL FUM 200-5 MCG/ACT IN AERO
2.0000 | INHALATION_SPRAY | Freq: Two times a day (BID) | RESPIRATORY_TRACT | Status: DC
  Filled 2022-07-03: qty 8.8

## 2022-07-03 MED ORDER — PERFLUTREN LIPID MICROSPHERE
1.0000 mL | INTRAVENOUS | Status: AC | PRN
  Administered 2022-07-03: 2 mL via INTRAVENOUS

## 2022-07-03 MED ORDER — ORAL CARE MOUTH RINSE: 15.0000 mL | OROMUCOSAL | Status: DC | PRN

## 2022-07-03 NOTE — Assessment & Plan Note (Signed)
A1c at 110 with target below 70.  Not able to tolerate statins and has tried different kinds.  We will try Zetia.

## 2022-07-03 NOTE — Telephone Encounter (Signed)
Pharmacy Patient Advocate Encounter  Insurance verification completed.    The patient is insured through [employer]   The patient is currently admitted and ran test claims for the following: Neletol '180mg'$  & Repatha pushtronex.  Both require PA. RPH checking w MD to see how to proceed.   Copays and coinsurance results were relayed to Inpatient clinical team.

## 2022-07-03 NOTE — Evaluation (Signed)
Occupational Therapy Evaluation Patient Details Name: Sharon [name] MRN: [id number] DOB: 05-18-1946 Today's Date: 07/03/2022   History of Present Illness Pt is a 76 year old female presenting to the ED with R arm weakness and slurred speech with MRI showing Acute ischemic right MCA territory infarct involving the right insular cortex, with additional minimal involvement of the overlying  right parietal cortex; PMH significant for history of CVA with residual left-sided weakness, history of COPD   Clinical Impression   Chart reviewed, pt greeted in bed agreeable to OT evaluation. Pt is alert and oriented x4, fair-good safety awareness. Pt presents with mild dysarthria. PTA pt was MOD I in dressing, grooming tasks, assist required for showering, IADL tasks from husband/aid. On this date pt presents with deficits in activity tolerance, balance, FMC/dexterity, strength, affecting safe and optimal ADL completion. Recommend discharge with HHOT to address functional deficits. OT will continue to follow acutely.      Recommendations for follow up therapy are one component of a multi-disciplinary discharge planning process, led by the attending physician.  Recommendations may be updated based on patient status, additional functional criteria and insurance authorization.   Follow Up Recommendations  Home health OT    Assistance Recommended at Discharge Intermittent Supervision/Assistance  Patient can return home with the following A little help with walking and/or transfers;Assistance with cooking/housework;A little help with bathing/dressing/bathroom    Functional Status Assessment  Patient has had a recent decline in their functional status and demonstrates the ability to make significant improvements in function in a reasonable and predictable amount of time.  Equipment Recommendations  None recommended by OT;Other (comment) (pt has recommended equipment)    Recommendations for Other Services        Precautions / Restrictions Precautions Precautions: Fall Restrictions Weight Bearing Restrictions: No      Mobility Bed Mobility Overal bed mobility: Needs Assistance Bed Mobility: Supine to Sit     Supine to sit: Supervision, HOB elevated          Transfers Overall transfer level: Needs assistance Equipment used: Rolling walker (2 wheels), None Transfers: Sit to/from Stand Sit to Stand: Supervision, Min assist           General transfer comment: STS no RW with CGA off bed, STS with RW off toilet with MIN A due to low toilet height      Balance Overall balance assessment: Needs assistance Sitting-balance support: Feet supported Sitting balance-Leahy Scale: Good     Standing balance support: Bilateral upper extremity supported, During functional activity Standing balance-Leahy Scale: Fair                             ADL either performed or assessed with clinical judgement   ADL Overall ADL's : Needs assistance/impaired     Grooming: Wash/dry hands;Sitting;Set up               Lower Body Dressing: Minimal assistance;Sit to/from stand;Maximal assistance Lower Body Dressing Details (indicate cue type and reason): MIN A underwear, MAX A for socks due to IV line location Toilet Transfer: Min guard;Minimal assistance;Regular Toilet;Rolling walker (2 wheels);Ambulation   Toileting- Clothing Manipulation and Hygiene: Minimal assistance;Sit to/from stand       Functional mobility during ADLs: Supervision/safety;Rolling walker (2 wheels) (in hallway to bathroom)       Vision Patient Visual Report: Blurring of vision (blurring worse R>L) Vision Assessment?: Yes Eye Alignment: Within Functional Limits Ocular Range of Motion:  Within Functional Limits Tracking/Visual Pursuits: Able to track stimulus in all quads without difficulty Visual Fields: No apparent deficits Additional Comments: will continue to assess     Perception  Perception Perception: Within Functional Limits   Praxis Praxis Praxis: Intact    Pertinent Vitals/Pain Pain Assessment Pain Assessment: 0-10 Pain Score: 4  Pain Location: L hip Pain Descriptors / Indicators: Aching Pain Intervention(s): Limited activity within patient's tolerance, Monitored during session, Repositioned     Hand Dominance Right   Extremity/Trunk Assessment Upper Extremity Assessment Upper Extremity Assessment: RUE deficits/detail;LUE deficits/detail RUE Deficits / Details: BUE shoulder AROM limited to approx 120 degrees, PROM appears WFL; strength 4/5 throughout; wrist NT due to IV; fair grip strength, pt also has arthritis RUE Sensation: WNL RUE Coordination: decreased fine motor LUE Deficits / Details: BUE shoulder AROM limited to approx 120 degrees, PROM appears WFL; strength 4/5 throughout; wrist NT due to IV; fair grip strength, pt also has arthritis LUE Sensation: WNL LUE Coordination: decreased fine motor   Lower Extremity Assessment Lower Extremity Assessment: Defer to PT evaluation   Cervical / Trunk Assessment Cervical / Trunk Assessment: Normal   Communication Communication Communication: No difficulties   Cognition Arousal/Alertness: Awake/alert Behavior During Therapy: WFL for tasks assessed/performed Overall Cognitive Status: Within Functional Limits for tasks assessed                                 General Comments: alert and oriented x4, intermittent vcs for safe RW use     General Comments  HR up to 118 with mobility, all other vital signs appear stable throughout; Pt with redness on anterior distal BLEs, reports this is ongoing    Exercises Other Exercises Other Exercises: edu re: role of OT, role of rehab, discharge recommendations, home safety, falls prevention, DME use   Shoulder Instructions      Home Living Family/patient expects to be discharged to:: Private residence Living Arrangements: Spouse/significant  other Available Help at Discharge: Family;Available PRN/intermittently;Personal care attendant (PCA 3 days a week ~4 hrs a day) Type of Home: House Home Access: Stairs to enter;Ramped entrance Entrance Stairs-Number of Steps: 7 up to landing, then 5 into house Entrance Stairs-Rails: Can reach both Home Layout: Laundry or work area in basement;One level Alternate Level Stairs-Number of Steps: has basement and main floor   Bathroom Shower/Tub: Walk-in shower   Bathroom Toilet: Handicapped height     Home Equipment: Conservation officer, nature (2 wheels);Cane - single point;Shower seat - built in          Prior Functioning/Environment Prior Level of Function : Needs assist             Mobility Comments: pt furnature walks home distances, reports she does not go out much of the house; ADLs Comments: MOD I with dressing, grooming; supervision from husband for showers, aid/husband assist with IADLs        OT Problem List: Decreased strength;Decreased knowledge of use of DME or AE;Decreased activity tolerance;Impaired balance (sitting and/or standing)      OT Treatment/Interventions: Self-care/ADL training;Patient/family education;Therapeutic exercise;Energy conservation;Balance training;Therapeutic activities;DME and/or AE instruction    OT Goals(Current goals can be found in the care plan section) Acute Rehab OT Goals Patient Stated Goal: get home OT Goal Formulation: With patient Time For Goal Achievement: 07/15/22 Potential to Achieve Goals: Good ADL Goals Pt Will Perform Grooming: with modified independence;sitting;standing Pt Will Perform Lower Body Dressing: with supervision;sit to/from stand  Pt Will Transfer to Toilet: with modified independence;ambulating Pt Will Perform Toileting - Clothing Manipulation and hygiene: with modified independence;sitting/lateral leans  OT Frequency: Min 3X/week    Co-evaluation PT/OT/SLP Co-Evaluation/Treatment: Yes Reason for Co-Treatment: To  address functional/ADL transfers   OT goals addressed during session: ADL's and self-care      AM-PAC OT "6 Clicks" Daily Activity     Outcome Measure Help from another person eating meals?: None Help from another person taking care of personal grooming?: None Help from another person toileting, which includes using toliet, bedpan, or urinal?: A Little Help from another person bathing (including washing, rinsing, drying)?: A Little Help from another person to put on and taking off regular upper body clothing?: None Help from another person to put on and taking off regular lower body clothing?: A Lot 6 Click Score: 20   End of Session Equipment Utilized During Treatment: Gait belt;Rolling walker (2 wheels) Nurse Communication: Mobility status  Activity Tolerance: Patient tolerated treatment well Patient left: in chair;with call bell/phone within reach;Other (comment) (PT/SLP present)  OT Visit Diagnosis: Unsteadiness on feet (R26.81)                Time: 2778-2423 OT Time Calculation (min): 24 min Charges:  OT General Charges $OT Visit: 1 Visit OT Evaluation $OT Eval Low Complexity: [place], OTD OTR/L  07/03/22, 9:44 AM

## 2022-07-03 NOTE — ED Notes (Signed)
Patient failed swallow screen.  Patient started coughing after taking small sip of water.  States she feels like it is hard to swallow

## 2022-07-03 NOTE — Plan of Care (Signed)
  Problem: Education: Goal: Knowledge of General Education information will improve Description: Including pain rating scale, medication(s)/side effects and non-pharmacologic comfort measures Outcome: Progressing   Problem: Health Behavior/Discharge Planning: Goal: Ability to manage health-related needs will improve Outcome: Progressing   Problem: Clinical Measurements: Goal: Ability to maintain clinical measurements within normal limits will improve Outcome: Progressing Goal: Will remain free from infection Outcome: Progressing Goal: Diagnostic test results will improve Outcome: Progressing Goal: Respiratory complications will improve Outcome: Progressing Goal: Cardiovascular complication will be avoided Outcome: Progressing   Problem: Activity: Goal: Risk for activity intolerance will decrease Outcome: Progressing   Problem: Nutrition: Goal: Adequate nutrition will be maintained Outcome: Progressing   Problem: Coping: Goal: Level of anxiety will decrease Outcome: Progressing   Problem: Elimination: Goal: Will not experience complications related to bowel motility Outcome: Progressing Goal: Will not experience complications related to urinary retention Outcome: Progressing   Problem: Pain Managment: Goal: General experience of comfort will improve Outcome: Progressing   Problem: Safety: Goal: Ability to remain free from injury will improve Outcome: Progressing   Problem: Skin Integrity: Goal: Risk for impaired skin integrity will decrease Outcome: Progressing   Problem: Education: Goal: Knowledge of disease or condition will improve Outcome: Progressing Goal: Knowledge of secondary prevention will improve (SELECT ALL) Outcome: Progressing Goal: Knowledge of patient specific risk factors will improve (INDIVIDUALIZE FOR PATIENT) Outcome: Progressing Goal: Individualized Educational Video(s) Outcome: Progressing   Problem: Coping: Goal: Will verbalize  positive feelings about self Outcome: Progressing Goal: Will identify appropriate support needs Outcome: Progressing   Problem: Health Behavior/Discharge Planning: Goal: Ability to manage health-related needs will improve Outcome: Progressing   Problem: Self-Care: Goal: Ability to participate in self-care as condition permits will improve Outcome: Progressing Goal: Verbalization of feelings and concerns over difficulty with self-care will improve Outcome: Progressing Goal: Ability to communicate needs accurately will improve Outcome: Progressing   Problem: Nutrition: Goal: Risk of aspiration will decrease Outcome: Progressing Goal: Dietary intake will improve Outcome: Progressing   Problem: Ischemic Stroke/TIA Tissue Perfusion: Goal: Complications of ischemic stroke/TIA will be minimized Outcome: Progressing

## 2022-07-03 NOTE — Evaluation (Signed)
Clinical/Bedside Swallow Evaluation Patient Details  Name: Sharon [name] MRN: [id number] Date of Birth: Jun 09, 1946  Today's Date: 07/03/2022 Time: SLP Start Time (ACUTE ONLY): 69 SLP Stop Time (ACUTE ONLY): 1050 SLP Time Calculation (min) (ACUTE ONLY): 30 min  Past Medical History:  Past Medical History:  Diagnosis Date   Anxiety    Arthritis    hands, upper back   Asthma    COPD (chronic obstructive pulmonary disease) ([place])    History of cervical cancer    Menopausal disorder    Osteoporosis    Pneumonia 1960   Spasm of abdominal muscles of right side    intermittent   Stroke ([place]) 2019   TMJ (dislocation of temporomandibular joint)    Wears dentures    partial lower   Past Surgical History:  Past Surgical History:  Procedure Laterality Date   ABDOMINAL HYSTERECTOMY  1970's   bladder botox  2005   BLADDER SUSPENSION  2004   CATARACT EXTRACTION W/PHACO Right 03/09/2018   Procedure: CATARACT EXTRACTION PHACO AND INTRAOCULAR LENS PLACEMENT ([place]) right;  Surgeon: Eulogio Bear, MD;  Location: [place];  Service: Ophthalmology;  Laterality: Right;  CALL CELL 1ST   CATARACT EXTRACTION W/PHACO Left 04/19/2018   Procedure: CATARACT EXTRACTION PHACO AND INTRAOCULAR LENS PLACEMENT (IOC)  LEFT;  Surgeon: Eulogio Bear, MD;  Location: [place];  Service: Ophthalmology;  Laterality: Left;   COLONOSCOPY WITH PROPOFOL N/A 12/13/2015   Procedure: COLONOSCOPY WITH PROPOFOL;  Surgeon: Lucilla Lame, MD;  Location: [place];  Service: Endoscopy;  Laterality: N/A;   COLONOSCOPY WITH PROPOFOL N/A 03/14/2021   Procedure: COLONOSCOPY WITH PROPOFOL;  Surgeon: Lucilla Lame, MD;  Location: [place];  Service: Endoscopy;  Laterality: N/A;  diabetic   LOOP RECORDER INSERTION N/A 01/07/2018   Procedure: LOOP RECORDER INSERTION;  Surgeon: Deboraha Sprang, MD;  Location: [facility];  Service: Cardiovascular;  Laterality: N/A;   POLYPECTOMY  N/A 12/13/2015   Procedure: POLYPECTOMY;  Surgeon: Lucilla Lame, MD;  Location: [place];  Service: Endoscopy;  Laterality: N/A;  SIGMOID COLON POLYPS X  5   POLYPECTOMY N/A 03/14/2021   Procedure: POLYPECTOMY;  Surgeon: Lucilla Lame, MD;  Location: [place];  Service: Endoscopy;  Laterality: N/A;   SHOULDER ARTHROSCOPY W/ ROTATOR CUFF REPAIR Right 1998   TEE WITHOUT CARDIOVERSION N/A 01/06/2018   Procedure: TRANSESOPHAGEAL ECHOCARDIOGRAM (TEE);  Surgeon: Minna Merritts, MD;  Location: [facility];  Service: Cardiovascular;  Laterality: N/A;   TONSILLECTOMY AND ADENOIDECTOMY     TOOTH EXTRACTION     July 2023   HPI:  Sharon [name] is a 76 y.o. female with medical history significant for anxiety disorder, history of CVA with residual left-sided weakness, history of COPD who presents to the ER via private vehicle for symptoms concerning for stroke. Pt screened following CVA in 2019, with no follow up services indicated. MRI Brain 07/02/22: Acute ischemic right MCA territory infarct involving the right insular cortex, with additional minimal involvement of the overlying right parietal cortex. No associated hemorrhage or mass effect. Underlying minimal chronic microvascular ischemic disease for age. MRA Head 07/02/22: Interval right M2 branch occlusion, superior division. Finding is consistent with the above described right MCA territory infarct. Otherwise stable and wide patency of the major intracranial arterial vasculature. Pt NPO following failing [facility] swallow screen.    Assessment / Plan / Recommendation  Clinical Impression  Bedside Swallow Assessment  Pt seen for clinical swallow assessment in  the setting of acute CVA. Pt seen with trials of thin liquid (straw and cup), puree, and regular solids. No overt or subtle s/sx pharyngeal dysphagia noted. No change to vocal quality across trials. Pt demonstrated adequate oral phase, as indicated by oral control and clearance across  consistencies. Pt endorsed coughing and challenge with oral containment initially with onset of symptoms, which appears to have resolved. Aspiration precautions introduced, with pt endorsing understanding. Recommend initiation of thin liquids and regular solids with aspiration precautions (slow rate, small bites, elevated HOB, and alert for PO intake). RN aware of recommendations. SLP to follow up to ensure maintained safety with intake and compliance with precautions.    Motor Speech/Cognitive Linguistic Assessment   Pt presents with mild dysarthria characterized by reduced vocal intensity, vocal quality changes (hoarse/strained quality) and reduced articulatory precision. Pt endorsed residual changes in voice from initial CVA in 2019;  therefore, unsure of the chronicity of current symptoms, suspect acute on chronic motor speech deficits. Regarding cognition, pt is oriented x3 (with min verbal cues and use of external aid bolstering identification of date), expressive and receptive language intact, short/long term recall unremarkable, and sustained attention is preserved. Pt denied changes to processing and independently identified and described use of medication chart for independent medication management at home. Given current mild dysarthria and pt reported lack of follow up speech therapy after initial CVA, recommend follow up therapy.     SLP Visit Diagnosis: Dysarthria and anarthria (R47.1);Dysphagia, oropharyngeal phase (R13.12)    Aspiration Risk  Mild aspiration risk    Diet Recommendation   Regular solids and thin liquids   Medication Administration: Whole meds with liquid    Other  Recommendations Oral Care Recommendations: Oral care BID    Recommendations for follow up therapy are one component of a multi-disciplinary discharge planning process, led by the attending physician.  Recommendations may be updated based on patient status, additional functional criteria and insurance  authorization.  Follow up Recommendations Home health SLP      Assistance Recommended at Discharge Intermittent Supervision/Assistance  Functional Status Assessment Patient has had a recent decline in their functional status and demonstrates the ability to make significant improvements in function in a reasonable and predictable amount of time.  Frequency and Duration min 1 x/week  1 week       Prognosis Prognosis for Safe Diet Advancement: Good Barriers to Reach Goals:  (none)      Swallow Study   General Date of Onset: 07/03/22 HPI: HAZLEY DEZEEUW is a 76 y.o. female with medical history significant for anxiety disorder, history of CVA with residual left-sided weakness, history of COPD who presents to the ER via private vehicle for symptoms concerning for stroke. Pt screened following CVA in 2019, with no follow up services indicated. MRI Brain 07/02/22: Acute ischemic right MCA territory infarct involving the right insular cortex, with additional minimal involvement of the overlying right parietal cortex. No associated hemorrhage or mass effect. Underlying minimal chronic microvascular ischemic disease for age. MRA Head 07/02/22: Interval right M2 branch occlusion, superior division. Finding is consistent with the above described right MCA territory infarct. Otherwise stable and wide patency of the major intracranial arterial vasculature. Pt NPO following failing [facility] swallow screen. Type of Study: Bedside Swallow Evaluation Previous Swallow Assessment: screen completed in March 2019 Diet Prior to this Study: NPO Temperature Spikes Noted: No (WBC 10.0) Respiratory Status: Room air History of Recent Intubation: No Behavior/Cognition: Alert;Cooperative Oral Cavity Assessment: Within Functional Limits Oral  Care Completed by SLP: No Oral Cavity - Dentition: Adequate natural dentition Vision: Functional for self-feeding Self-Feeding Abilities: Needs set up Patient Positioning: Upright in  chair Baseline Vocal Quality: Low vocal intensity;Hoarse (Hoarse/strained) Volitional Cough: Strong Volitional Swallow: Able to elicit    Oral/Motor/Sensory Function Overall Oral Motor/Sensory Function: Mild impairment Facial ROM: Reduced left Facial Symmetry: Abnormal symmetry left (upon activation) Facial Strength: Reduced left Facial Sensation: Reduced left Lingual ROM: Within Functional Limits Lingual Symmetry: Within Functional Limits Lingual Strength: Within Functional Limits Lingual Sensation: Within Functional Limits Velum: Within Functional Limits Mandible: Within Functional Limits   Ice Chips Ice chips: Not tested   Thin Liquid Thin Liquid: Within functional limits Presentation: Cup;Straw    Nectar Thick Nectar Thick Liquid: Not tested   Honey Thick Honey Thick Liquid: Not tested   Puree Puree: Within functional limits Presentation: Self Fed;Spoon   Solid    [place] Clapp  MS CCC-SLP Solid: Within functional limits Presentation: Self Fed      [place] J Clapp 07/03/2022,12:26 PM

## 2022-07-03 NOTE — ED Notes (Signed)
Patient taken to MRI at this time.  Report given to Jenny Reichmann, RN in C POD

## 2022-07-03 NOTE — Hospital Course (Addendum)
76 y.o. female with medical history significant for anxiety, history of CVA with residual left-sided weakness and COPD who presented to the ER on 9/20 with slurred speech and weakness of her right upper extremity.  CT scan of the head suggestive of an acute infarct.  Her symptoms have improved since her initial presentation, but still with some persistent weakness.  Patient was seen in the ED about a week ago for left-sided numbness and had extensive work-up, negative at that time for acute CVA.  Admitted for further workup.

## 2022-07-03 NOTE — Consult Note (Addendum)
Neurology Consultation Reason for Consult: Stroke on MRI Requesting Physician: Gevena Barre  CC: Left sided numbness   History is obtained from: Patient and chart review  HPI: Sharon [name] is a 76 y.o. female with a past medical history significant for prior embolic stroke of undetermined source (12/2019, right MCA distribution), coronary artery calcification, aortic atherosclerosis, stable mild abdominal aortic aneurysm (4 cm, following with vascular surgery), former tobacco use (quit 2017.33-pack-year history), lymphedema (left greater than right lower extremity swelling), cervical cancer, asthma, anxiety  She had presented on 9/14 with facial numbness and tingling of the left arm but moves slowly up into her face.  MRI brain was negative and she was discharged with continuation of her home Plavix 25 mg daily and close outpatient follow-up to consider reintroducing statin versus PSK 9.  Of note she did not tolerate even 20 mg of rosuvastatin 3 times weekly and had discomfort even with Praluent and Zetia.  Today she notes her symptoms are much improving but still present  Regarding her stroke in 2019 she presented with slurred speech and weakness of the left hand and stroke work-up was negative including no significant carotid artery atherosclerosis, negative TTE and TEE, and loop recorder implantation (explanted 12/2021 and negative)  Regarding hyperlipidemia, she has been trialed on Praluent before, but this was discontinued due to injection site reaction with flulike symptoms  LKW: 07/02/2022 19:30:00 Thrombolytic given?: No, hypodensity on HCT c/f subacute infarct IA performed?: No, NIH 1  Premorbid modified rankin scale:      0 - No symptoms.  ROS: All other review of systems was negative except as noted in the HPI.   Past Medical History:  Diagnosis Date   Anxiety    Arthritis    hands, upper back   Asthma    COPD (chronic obstructive pulmonary disease) ([place])    History of  cervical cancer    Menopausal disorder    Osteoporosis    Pneumonia 1960   Spasm of abdominal muscles of right side    intermittent   Stroke ([place]) 2019   TMJ (dislocation of temporomandibular joint)    Wears dentures    partial lower   Past Surgical History:  Procedure Laterality Date   ABDOMINAL HYSTERECTOMY  1970's   bladder botox  2005   BLADDER SUSPENSION  2004   CATARACT EXTRACTION W/PHACO Right 03/09/2018   Procedure: CATARACT EXTRACTION PHACO AND INTRAOCULAR LENS PLACEMENT ([place]) right;  Surgeon: Eulogio Bear, MD;  Location: [place];  Service: Ophthalmology;  Laterality: Right;  CALL CELL 1ST   CATARACT EXTRACTION W/PHACO Left 04/19/2018   Procedure: CATARACT EXTRACTION PHACO AND INTRAOCULAR LENS PLACEMENT (IOC)  LEFT;  Surgeon: Eulogio Bear, MD;  Location: [place];  Service: Ophthalmology;  Laterality: Left;   COLONOSCOPY WITH PROPOFOL N/A 12/13/2015   Procedure: COLONOSCOPY WITH PROPOFOL;  Surgeon: Lucilla Lame, MD;  Location: [place];  Service: Endoscopy;  Laterality: N/A;   COLONOSCOPY WITH PROPOFOL N/A 03/14/2021   Procedure: COLONOSCOPY WITH PROPOFOL;  Surgeon: Lucilla Lame, MD;  Location: [place];  Service: Endoscopy;  Laterality: N/A;  diabetic   LOOP RECORDER INSERTION N/A 01/07/2018   Procedure: LOOP RECORDER INSERTION;  Surgeon: Deboraha Sprang, MD;  Location: [place] [facility];  Service: Cardiovascular;  Laterality: N/A;   POLYPECTOMY N/A 12/13/2015   Procedure: POLYPECTOMY;  Surgeon: Lucilla Lame, MD;  Location: [place];  Service: Endoscopy;  Laterality: N/A;  SIGMOID COLON POLYPS X  5  POLYPECTOMY N/A 03/14/2021   Procedure: POLYPECTOMY;  Surgeon: Lucilla Lame, MD;  Location: [place];  Service: Endoscopy;  Laterality: N/A;   SHOULDER ARTHROSCOPY W/ ROTATOR CUFF REPAIR Right 1998   TEE WITHOUT CARDIOVERSION N/A 01/06/2018   Procedure: TRANSESOPHAGEAL ECHOCARDIOGRAM (TEE);  Surgeon:  Minna Merritts, MD;  Location: [facility];  Service: Cardiovascular;  Laterality: N/A;   TONSILLECTOMY AND ADENOIDECTOMY     TOOTH EXTRACTION     July 2023     Family History  Problem Relation Age of Onset   Diabetes Mother    Heart disease Mother    Stroke Mother    Stroke Maternal Grandmother    Hyperlipidemia Neg Hx    Current Outpatient Medications  Medication Instructions   acetaminophen (TYLENOL) 500 mg, Oral, Every 6 hours PRN   albuterol (PROAIR HFA) 108 (90 Base) MCG/ACT inhaler 2 puffs, Inhalation, Every 6 hours PRN   Blood Glucose Monitoring Suppl (ONETOUCH VERIO) w/Device KIT Utilize to check blood sugar twice a day, fasting in morning with goal < 130 and then 2 hours after a meal with goal <180.  Document and bring to visits.   Budeson-Glycopyrrol-Formoterol (BREZTRI AEROSPHERE) 160-9-4.8 MCG/ACT AERO 2 puffs, Inhalation, 2 times daily   Cholecalciferol 1.25 MG (50000 UT) TABS 1 tablet, Oral, Weekly   citalopram (CELEXA) 20 mg, Oral, As needed   clopidogrel (PLAVIX) 75 MG tablet TAKE 1 TABLET(75 MG) BY MOUTH DAILY   Crisaborole (EUCRISA) 2 % OINT 1 application , Topical, Daily, Use for itching and inflammation at left leg   Cyanocobalamin 1,000 mcg, Every 30 days   desoximetasone (TOPICORT) 0.05 % cream Topical, 2 times daily   glucose blood (ONETOUCH VERIO) test strip UTILIIZE TO CHECK BLOOD SUGAR TWICE DAILY FASTING IN MORNING WITH GOAL<130 AND THAN 2 HOURS AFTER A MEAL WITH GGOAL<180   Lancets (ONETOUCH ULTRASOFT) lancets Utilize to check blood sugar twice a day, fasting in morning with goal < 130 and then 2 hours after a meal with goal <180.  Document and bring to visits.   loratadine (CLARITIN) 10 mg, Oral, Daily   pantoprazole (PROTONIX) 40 MG tablet TAKE 1 TABLET(40 MG) BY MOUTH DAILY   phenazopyridine (PYRIDIUM) 100 mg, Oral, 3 times daily PRN     Social History:  reports that she quit smoking about 5 years ago. Her smoking use included cigarettes. She has  never used smokeless tobacco. She reports current alcohol use. She reports that she does not use drugs.   Exam: Current vital signs: BP 121/88   Pulse 83   Temp 98.5 F (36.9 C) (Oral)   Resp 17   Ht 5' 7"  (0.626 m)   Wt 102.1 kg   LMP  (LMP Unknown)   SpO2 95%   BMI 35.25 kg/m  Vital signs in last 24 hours: Temp:  [98 F (36.7 C)-98.5 F (36.9 C)] 98.5 F (36.9 C) (09/21 1034) Pulse Rate:  [69-101] 83 (09/21 1004) Resp:  [16-26] 17 (09/21 1004) BP: (111-167)/(50-88) 121/88 (09/21 1000) SpO2:  [95 %-98 %] 95 % (09/21 1004) Weight:  [102.1 kg] 102.1 kg (09/20 2142)   Physical Exam  Constitutional: Appears well-developed and well-nourished.  Psych: Affect appropriate to situation, pleasant, cooperative, good sense of humor Eyes: No scleral injection HENT: No oropharyngeal obstruction.  MSK: no joint deformities.  Cardiovascular: Normal rate and regular rhythm. Perfusing extremities well Respiratory: Effort normal, non-labored breathing GI: Soft.  No distension. There is no tenderness.  Skin: Warm dry and intact visible skin,  chronic changes of edema in the LLE compared to the right.   Neuro: Mental Status: Patient is awake, alert, oriented to person, place, month, year, and situation Patient is able to give a clear and coherent history. No signs of aphasia or neglect Cranial Nerves: II: Visual Fields are full. Pupils are equal, round, and reactive to light.   III,IV, VI: EOMI without ptosis or diploplia.  V: Facial sensation is symmetric to light touch except for the right V2 distribution which she attributes to excessive earwax in her right ear creating a sensation of fullness VII: Facial movement is symmetric.  VIII: hearing is intact to voice X: Uvula elevates symmetrically XI: Shoulder shrug is symmetric. XII: tongue is midline without atrophy or fasciculations.  Motor: Tone is normal. Bulk is normal. 5/5 strength was present in all four extremities except 4+  HF on the right and 4 on the left, 4+ KE (also somewhat limited by pain) Sensory: Sensation is slightly reduced on the left arm and left leg, improving from prior per patient Deep Tendon Reflexes: 3+ and symmetric in the brachioradialis and patellae.  Cerebellar: FNF and HKS are intact bilaterally Gait:  Able to rise on heels and toes but slightly harder on the left subjectively for patient.  Unable to tandem  NIHSS total 2 Score breakdown: mild dysarthria, mild sensory loss on the left   I have reviewed labs in epic and the results pertinent to this consultation are:  Basic Metabolic Panel: Recent Labs  Lab 07/02/22 2145 07/04/22 0449  NA 139 141  K 4.2 3.8  CL 106 108  CO2 24 25  GLUCOSE 146* 120*  BUN 23 15  CREATININE 1.15* 0.82  CALCIUM 9.3 9.0    CBC: Recent Labs  Lab 07/02/22 2145  WBC 10.0  NEUTROABS 7.4  HGB 14.8  HCT 45.1  MCV 91.3  PLT 233    Coagulation Studies: Recent Labs    07/02/22 2145  LABPROT 13.1  INR 1.0     Lab Results  Component Value Date   HGBA1C 6.4 (H) 07/03/2022    Lab Results  Component Value Date   CHOL 171 07/03/2022   HDL 47 07/03/2022   LDLCALC 110 (H) 07/03/2022   LDLDIRECT 142.3 (H) 03/21/2021   TRIG 72 07/03/2022   CHOLHDL 3.6 07/03/2022     I have reviewed the images obtained:  CT Head 1. Focal hypodensity involving the right insular cortex, suspicious for an evolving acute right MCA distribution infarct. No intracranial hemorrhage. 2. ASPECTS is 9. 3. Underlying atrophy with chronic small vessel ischemic disease.  MRI HEAD IMPRESSION:   1. Acute ischemic right MCA territory infarct involving the right insular cortex, with additional minimal involvement of the overlying right parietal cortex. No associated hemorrhage or mass effect. 2. Underlying minimal chronic microvascular ischemic disease for age.   MRA HEAD IMPRESSION:   1. Interval right M2 branch occlusion, superior division. Finding  is consistent with the above described right MCA territory infarct. 2. Otherwise stable and wide patency of the major intracranial arterial vasculature.   MRA NECK IMPRESSION:   1. Wide patency of both carotid artery systems within the neck. 2. Previously noted stenosis at the origin of the left vertebral artery is more mild to moderate in appearance on today's exam. Otherwise wide patency of both vertebral arteries within the neck. Left vertebral artery is dominant.  Lab Results  Component Value Date   HGBA1C 6.4 (H) 07/03/2022    Lab Results  Component  Value Date   CHOL 171 07/03/2022   HDL 47 07/03/2022   LDLCALC 110 (H) 07/03/2022   LDLDIRECT 142.3 (H) 03/21/2021   TRIG 72 07/03/2022   CHOLHDL 3.6 07/03/2022    Impression: Third episode of recurrent cryptogenic stroke/TIA in the right carotid distribution.  She has had extensive work-up for occult atrial fibrillation in the past including recently explanted loop recorder so I doubt occult atrial fibrillation as the etiology.  However I will repeat an echo to confirm her cardiac function remains stable.  Chronic whether is a rare etiology for cryptogenic stroke and will evaluate for this as it would potentially be treatable surgically  Recommendations: - Repeat ECHO - CTA head and neck to eval for carotid web - Aspirin 81 mg for 21 days, continue home Plavix - Patient counseled on potentially trying Repatha, but she is hesitant due to intolerance of both statin and Praluent in the past, defer to outpatient per her preference - Patient is tolerating normotension with improvement of her symptoms, so goal should be normotension going forward  Lesleigh Noe MD-PhD [facility] [phone] [facility] coverage for [facility] is from 8 AM to 4 AM in-house and 4 PM to 8 PM by telephone/video. 8 PM to 8 AM emergent questions or overnight urgent questions should be addressed to Teleneurology On-call or [facility]  neurohospitalist; contact information can be found on AMION

## 2022-07-03 NOTE — Evaluation (Signed)
Physical Therapy Evaluation Patient Details Name: Sharon [name] MRN: [id number] DOB: 18-Sep-1946 Today's Date: 07/03/2022  History of Present Illness  Pt is a 76 year old female presenting to the ED with R arm weakness and slurred speech with MRI showing Acute ischemic right MCA territory infarct involving the right insular cortex, with additional minimal involvement of the overlying  right parietal cortex; PMH significant for history of CVA with residual left-sided weakness, history of COPD.   Clinical Impression  Patient alert, seated EOB with OT upon PT entrance, OT/PT overlap for mobility and ADLs. Oriented to self, place, situation. Pt reported at baseline she is predominantly homebound due to fear of falling and furniture walks, does have a walker.  The patient was able to perform sit <> stand from EOB without RW CGA, from low commode minA, and then returned to recliner in room with RW and supervision. She ambulated ~72f with RW and CGA-supervision as well. No LOB, just decreased gait velocity and decreased stride length. Pt demonstrated symmetrical BLE strength (except for L hip flexion, pt reported this is a chronic issue). Pt reported and demonstrated near return to baseline level of functioning but would benefit from skilled PT intervention to maximize safety at home, education, and fear of falling. Recommendation at this time is HHPT.        Recommendations for follow up therapy are one component of a multi-disciplinary discharge planning process, led by the attending physician.  Recommendations may be updated based on patient status, additional functional criteria and insurance authorization.  Follow Up Recommendations Home health PT      Assistance Recommended at Discharge Intermittent Supervision/Assistance  Patient can return home with the following  Assistance with cooking/housework;Assist for transportation;Help with stairs or ramp for entrance    Equipment Recommendations  None recommended by PT  Recommendations for Other Services       Functional Status Assessment Patient has had a recent decline in their functional status and demonstrates the ability to make significant improvements in function in a reasonable and predictable amount of time.     Precautions / Restrictions Precautions Precautions: Fall Restrictions Weight Bearing Restrictions: No      Mobility  Bed Mobility               General bed mobility comments: sitting EOB with OT    Transfers Overall transfer level: Needs assistance Equipment used: Rolling walker (2 wheels) Transfers: Sit to/from Stand Sit to Stand: Supervision, Min assist           General transfer comment: STS no RW with CGA off bed, STS with RW off toilet with MIN A due to low toilet height; able to sit in recliner with supervision    Ambulation/Gait   Gait Distance (Feet): 60 Feet Assistive device: Rolling walker (2 wheels)         General Gait Details: no LOB, decreased velocity but grossly WFLs  Stairs            Wheelchair Mobility    Modified Rankin (Stroke Patients Only)       Balance Overall balance assessment: Needs assistance Sitting-balance support: Feet supported Sitting balance-Leahy Scale: Good     Standing balance support: Bilateral upper extremity supported, During functional activity Standing balance-Leahy Scale: Fair Standing balance comment: pericare in standing with CGA                             Pertinent Vitals/Pain Pain  Assessment Pain Assessment: 0-10 Pain Score: 4  Pain Location: L hip Pain Descriptors / Indicators: Aching Pain Intervention(s): Limited activity within patient's tolerance, Monitored during session, Repositioned    Home Living Family/patient expects to be discharged to:: Private residence Living Arrangements: Spouse/significant other Available Help at Discharge: Family;Available PRN/intermittently;Personal care attendant  (PCA 3 days a week ~4 hrs a day) Type of Home: House Home Access: Stairs to enter;Ramped entrance Entrance Stairs-Rails: Can reach both Entrance Stairs-Number of Steps: 7 up to landing, then 5 into house Alternate Level Stairs-Number of Steps: has basement and main floor Home Layout: Laundry or work area in basement;One level Home Equipment: Conservation officer, nature (2 wheels);Cane - single point;Shower seat - built in      Prior Function Prior Level of Function : Needs assist             Mobility Comments: pt furnature walks home distances, reports she does not go out much of the house; ADLs Comments: MOD I with dressing, grooming; supervision from husband for showers, aid/husband assist with IADLs     Hand Dominance   Dominant Hand: Right    Extremity/Trunk Assessment   Upper Extremity Assessment Upper Extremity Assessment: RUE deficits/detail;LUE deficits/detail RUE Deficits / Details: BUE shoulder AROM limited to approx 120 degrees, PROM appears WFL; strength 4/5 throughout; wrist NT due to IV; fair grip strength, pt also has arthritis RUE Sensation: WNL RUE Coordination: decreased fine motor LUE Deficits / Details: BUE shoulder AROM limited to approx 120 degrees, PROM appears WFL; strength 4/5 throughout; wrist NT due to IV; fair grip strength, pt also has arthritis LUE Sensation: WNL LUE Coordination: decreased fine motor    Lower Extremity Assessment Lower Extremity Assessment: RLE deficits/detail;LLE deficits/detail RLE Deficits / Details: 4+/5 grossly RLE Sensation: WNL RLE Coordination: WNL LLE Deficits / Details: 4+/5 grossly 3+/5 hip flexion pt reported this is a chronic issue LLE Sensation: WNL LLE Coordination: WNL    Cervical / Trunk Assessment Cervical / Trunk Assessment: Normal  Communication   Communication: No difficulties  Cognition Arousal/Alertness: Awake/alert Behavior During Therapy: WFL for tasks assessed/performed Overall Cognitive Status: Within  Functional Limits for tasks assessed                                 General Comments: alert and oriented x4, intermittent vcs for safe RW use        General Comments General comments (skin integrity, edema, etc.): HR up to 118 with mobility, all other vital signs appear stable throughout; Pt with redness on anterior distal BLEs, reports this is ongoing    Exercises     Assessment/Plan    PT Assessment Patient needs continued PT services  PT Problem List Decreased strength;Decreased mobility;Decreased activity tolerance;Decreased balance       PT Treatment Interventions DME instruction;Therapeutic exercise;Gait training;Balance training;Stair training;Functional mobility training;Therapeutic activities;Patient/family education;Neuromuscular re-education    PT Goals (Current goals can be found in the Care Plan section)  Acute Rehab PT Goals Patient Stated Goal: to go home PT Goal Formulation: With patient Time For Goal Achievement: 07/17/22 Potential to Achieve Goals: Good    Frequency Min 2X/week     Co-evaluation   Reason for Co-Treatment: To address functional/ADL transfers   OT goals addressed during session: ADL's and self-care       AM-PAC PT "6 Clicks" Mobility  Outcome Measure Help needed turning from your back to your side while in a flat  bed without using bedrails?: None Help needed moving from lying on your back to sitting on the side of a flat bed without using bedrails?: None Help needed moving to and from a bed to a chair (including a wheelchair)?: None Help needed standing up from a chair using your arms (e.g., wheelchair or bedside chair)?: None Help needed to walk in hospital room?: None Help needed climbing 3-5 steps with a railing? : A Little 6 Click Score: 23    End of Session Equipment Utilized During Treatment: Gait belt Activity Tolerance: Patient tolerated treatment well Patient left: in chair;with call bell/phone within reach  (with speech at bedside) Nurse Communication: Mobility status PT Visit Diagnosis: Other abnormalities of gait and mobility (R26.89);Difficulty in walking, not elsewhere classified (R26.2);Muscle weakness (generalized) (M62.81)    Time: 2440-1027 PT Time Calculation (min) (ACUTE ONLY): 14 min   Charges:   PT Evaluation $PT Eval Low Complexity: 1 Low PT Treatments $Therapeutic Activity: 8-22 mins        Lieutenant Diego PT, DPT 9:49 AM,07/03/22

## 2022-07-03 NOTE — Progress Notes (Signed)
[facility] Progress Note  Patient: Sharon [name]    QJJ:[id number]  DOA: 07/02/2022    Date of Service: the patient was seen and examined on 07/03/2022  Brief hospital course: 76 y.o. female with medical history significant for anxiety, history of CVA with residual left-sided weakness and COPD who presented to the ER on 9/20 with slurred speech and weakness of her right upper extremity.  CT scan of the head suggestive of an acute infarct.  Her symptoms have improved since her initial presentation, but still with some persistent weakness.  Patient was seen in the ED about a week ago for left-sided numbness and had extensive work-up, negative at that time for acute CVA.  Admitted for further workup.  Assessment and Plan: Assessment and Plan: * CVA (cerebral vascular accident) [facility]) Patient presents for evaluation of slurred speech and right arm weakness Both symptoms have improved, although weakness still present.  Patient found to have what looks to be an evolving acute right MCA distribution infarct.  Recently seen for left-sided numbness last week which resolved and was discharged home to follow-up with neurology as an outpatient.  CT angiogram of head and neck pending.  Echocardiogram results pending.  Not on statins due to allergy, will try Zetia.  On Plavix 2019 due to episodes of bruising with aspirin.  If felt to be small vessel, patient willing to try aspirin at least 3 times a week and can titrate upward.  MRI of the brain noted confirming above CT findings.  A1c at 6.0.  Patient does not smoke.  Has always had stable blood pressure.  Hyperlipidemia A1c at 110 with target below 70.  Not able to tolerate statins and has tried different kinds.  We will try Zetia.  Prediabetes A1c at 6.4 on admission  Anxiety Stable Continue Celexa  Centrilobular emphysema (HCC) Stable and not acutely exacerbated Continue as needed bronchodilator therapy  Morbid obesity ([place]) Counseled about  weight loss.  Patient meets criteria with BMI greater than 35 and comorbidities of CVA       Body mass index is 35.25 kg/m.        Consultants: Neurology  Procedures: Echocardiogram: Results pending  Antimicrobials: None  Code Status: Full code   Subjective: Patient complains of some clumsiness with her right upper extremity  Objective: Vital signs were reviewed and unremarkable. Vitals:   07/03/22 1500 07/03/22 1556  BP: 121/62 135/67  Pulse: 72 88  Resp: 20 16  Temp:  (!) 97.5 F (36.4 C)  SpO2: 94% 100%    Intake/Output Summary (Last 24 hours) at 07/03/2022 1643 Last data filed at 07/03/2022 1555 Gross per 24 hour  Intake --  Output 1 ml  Net -1 ml   Filed Weights   07/02/22 2142  Weight: 102.1 kg   Body mass index is 35.25 kg/m.  Exam:  General: Alert and oriented x3, no acute distress HEENT: Normocephalic, atraumatic, mucous membranes are moist Cardiovascular: Regular rate and rhythm, S1-S2 Respiratory: Clear to auscultation bilaterally Abdomen: Soft, nontender, nondistended, positive bowel sounds Musculoskeletal: Clubbing or cyanosis or edema Skin: No skin breaks, tears or lesions Psychiatry: Appropriate, no evidence of psychoses Neurology: Patient has some 5 -/5 of right upper extremity in terms of grip, flexion and extension as compared to the left upper extremity.  Lower extremities are symmetric  Data Reviewed: LDL of 110, A1c of 6.4  Disposition:  Status is: Inpatient Remains inpatient appropriate because: Evaluation of echocardiogram and CT angiogram of neck Patient seen by  PT and OT who are recommending home health  Anticipated discharge date: 9/22  Family Communication: Will call husband DVT Prophylaxis: SCD's Start: 07/02/22 2343    Author: Annita Brod ,MD 07/03/2022 4:43 PM  To reach On-call, see care teams to locate the attending and reach out via [email or web address]. Between 7PM-7AM, please contact night-coverage If  you still have difficulty reaching the attending provider, please page the [facility] (Director on Call) for [facility] on amion for assistance.

## 2022-07-03 NOTE — Progress Notes (Signed)
*  PRELIMINARY RESULTS* Echocardiogram 2D Echocardiogram has been performed.  Sharon [name] 07/03/2022, 1:57 PM

## 2022-07-03 NOTE — Progress Notes (Addendum)
   07/03/22 0700  Clinical Encounter Type  Visited With Health care provider  Visit Type Initial  Referral From Nurse  Consult/Referral To Chaplain   Chaplain responded to code stroke. Patient in scans.Chaplain services will follow up.

## 2022-07-04 ENCOUNTER — Telehealth: Payer: Self-pay | Admitting: Cardiovascular Disease

## 2022-07-04 ENCOUNTER — Ambulatory Visit: Payer: Medicare Other | Admitting: Nurse Practitioner

## 2022-07-04 DIAGNOSIS — Z8673 Personal history of transient ischemic attack (TIA), and cerebral infarction without residual deficits: Secondary | ICD-10-CM

## 2022-07-04 DIAGNOSIS — E559 Vitamin D deficiency, unspecified: Secondary | ICD-10-CM

## 2022-07-04 DIAGNOSIS — G720 Drug-induced myopathy: Secondary | ICD-10-CM

## 2022-07-04 DIAGNOSIS — E782 Mixed hyperlipidemia: Secondary | ICD-10-CM | POA: Diagnosis not present

## 2022-07-04 DIAGNOSIS — M85852 Other specified disorders of bone density and structure, left thigh: Secondary | ICD-10-CM

## 2022-07-04 DIAGNOSIS — E538 Deficiency of other specified B group vitamins: Secondary | ICD-10-CM

## 2022-07-04 DIAGNOSIS — I5032 Chronic diastolic (congestive) heart failure: Secondary | ICD-10-CM | POA: Diagnosis not present

## 2022-07-04 DIAGNOSIS — D692 Other nonthrombocytopenic purpura: Secondary | ICD-10-CM

## 2022-07-04 DIAGNOSIS — I7123 Aneurysm of the descending thoracic aorta, without rupture: Secondary | ICD-10-CM

## 2022-07-04 DIAGNOSIS — E1169 Type 2 diabetes mellitus with other specified complication: Secondary | ICD-10-CM

## 2022-07-04 DIAGNOSIS — I63411 Cerebral infarction due to embolism of right middle cerebral artery: Secondary | ICD-10-CM

## 2022-07-04 DIAGNOSIS — J432 Centrilobular emphysema: Secondary | ICD-10-CM

## 2022-07-04 DIAGNOSIS — I700 Atherosclerosis of aorta: Secondary | ICD-10-CM

## 2022-07-04 DIAGNOSIS — E1351 Other specified diabetes mellitus with diabetic peripheral angiopathy without gangrene: Secondary | ICD-10-CM

## 2022-07-04 DIAGNOSIS — I63511 Cerebral infarction due to unspecified occlusion or stenosis of right middle cerebral artery: Secondary | ICD-10-CM

## 2022-07-04 DIAGNOSIS — F419 Anxiety disorder, unspecified: Secondary | ICD-10-CM

## 2022-07-04 LAB — BASIC METABOLIC PANEL
Anion gap: 8 (ref 5–15)
BUN: 15 mg/dL (ref 8–23)
CO2: 25 mmol/L (ref 22–32)
Calcium: 9 mg/dL (ref 8.9–10.3)
Chloride: 108 mmol/L (ref 98–111)
Creatinine, Ser: 0.82 mg/dL (ref 0.44–1.00)
GFR, Estimated: >60 mL/min (ref >60)
Glucose, Bld: 120 mg/dL — ABNORMAL HIGH (ref 70–99)
Potassium: 3.8 mmol/L (ref 3.5–5.1)
Sodium: 141 mmol/L (ref 135–145)

## 2022-07-04 LAB — GLUCOSE, CAPILLARY
Glucose-Capillary: 127 mg/dL — ABNORMAL HIGH (ref 70–99)
Glucose-Capillary: 129 mg/dL — ABNORMAL HIGH (ref 70–99)
Glucose-Capillary: 147 mg/dL — ABNORMAL HIGH (ref 70–99)

## 2022-07-04 MED ORDER — EZETIMIBE 10 MG PO TABS: 10.0000 mg | ORAL_TABLET | Freq: Every day | ORAL | 11 refills | Status: DC

## 2022-07-04 MED ORDER — ASPIRIN 81 MG PO TBEC: 81.0000 mg | DELAYED_RELEASE_TABLET | Freq: Every day | ORAL | 2 refills | Status: AC

## 2022-07-04 NOTE — Progress Notes (Signed)
Physical Therapy Treatment Patient Details Name: Sharon [name] MRN: [id number] DOB: 01/14/1946 Today's Date: 07/04/2022   History of Present Illness Pt is a 76 year old female presenting to the ED with R arm weakness and slurred speech with MRI showing Acute ischemic right MCA territory infarct involving the right insular cortex, with additional minimal involvement of the overlying  right parietal cortex; PMH significant for history of CVA with residual left-sided weakness, history of COPD    PT Comments    Patient alert, agreeable to PT reported some neck pain (chronic issue). She was able to perform bed mobility modI, and sit <> stand with RW and supervision, good hand placement noted. She ambulated ~27f with RW and CGA today, no LOB or standing rest breaks but pt did endorse some fatigue after activity. Educated on activity pacing and DME use as indicated at home, as well as pt dietary recommendations from speech (viewed in chart by PT). Pt returned to bed with all needs in reach reported she had sat up earlier this AM and would sit up for lunch in a bit. The patient would benefit from further skilled PT intervention to continue to progress towards goals. Recommendation remains appropriate.     Recommendations for follow up therapy are one component of a multi-disciplinary discharge planning process, led by the attending physician.  Recommendations may be updated based on patient status, additional functional criteria and insurance authorization.  Follow Up Recommendations  Home health PT     Assistance Recommended at Discharge Intermittent Supervision/Assistance  Patient can return home with the following Assistance with cooking/housework;Assist for transportation;Help with stairs or ramp for entrance   Equipment Recommendations  None recommended by PT    Recommendations for Other Services       Precautions / Restrictions Precautions Precautions: Fall Restrictions Weight Bearing  Restrictions: No     Mobility  Bed Mobility Overal bed mobility: Modified Independent                  Transfers Overall transfer level: Needs assistance   Transfers: Sit to/from Stand Sit to Stand: Supervision                Ambulation/Gait   Gait Distance (Feet): 200 Feet Assistive device: Rolling walker (2 wheels)         General Gait Details: no standing rest breaks, no LOB, did endorse some fatigue after walking   Stairs             Wheelchair Mobility    Modified Rankin (Stroke Patients Only)       Balance Overall balance assessment: Needs assistance Sitting-balance support: Feet supported Sitting balance-Leahy Scale: Good     Standing balance support: Bilateral upper extremity supported, During functional activity Standing balance-Leahy Scale: Fair Standing balance comment: improved confidence and comfort with BUE support                            Cognition Arousal/Alertness: Awake/alert Behavior During Therapy: WFL for tasks assessed/performed Overall Cognitive Status: Within Functional Limits for tasks assessed                                          Exercises      General Comments        Pertinent Vitals/Pain Pain Assessment Pain Assessment: Faces Faces Pain Scale: No  hurt Pain Location: chronic neck pain Pain Descriptors / Indicators: Grimacing, Sore Pain Intervention(s): Limited activity within patient's tolerance, Monitored during session, Repositioned    Home Living                          Prior Function            PT Goals (current goals can now be found in the care plan section) Progress towards PT goals: Progressing toward goals    Frequency    Min 2X/week      PT Plan Current plan remains appropriate    Co-evaluation              AM-PAC PT "6 Clicks" Mobility   Outcome Measure  Help needed turning from your back to your side while in a flat  bed without using bedrails?: None Help needed moving from lying on your back to sitting on the side of a flat bed without using bedrails?: None Help needed moving to and from a bed to a chair (including a wheelchair)?: None Help needed standing up from a chair using your arms (e.g., wheelchair or bedside chair)?: None Help needed to walk in hospital room?: None Help needed climbing 3-5 steps with a railing? : A Little 6 Click Score: 23    End of Session Equipment Utilized During Treatment: Gait belt Activity Tolerance: Patient tolerated treatment well Patient left: in bed;with call bell/phone within reach;with bed alarm set Nurse Communication: Mobility status PT Visit Diagnosis: Other abnormalities of gait and mobility (R26.89);Difficulty in walking, not elsewhere classified (R26.2);Muscle weakness (generalized) (M62.81)     Time: 7591-6384 PT Time Calculation (min) (ACUTE ONLY): 19 min  Charges:  $Therapeutic Activity: 8-22 mins                     Lieutenant Diego PT, DPT 11:02 AM,07/04/22

## 2022-07-04 NOTE — TOC Transition Note (Addendum)
Transition of Care [facility]) - CM/SW Discharge Note   Patient Details  Name: Sharon [name] MRN: [id number] Date of Birth: 02-Dec-1945  Transition of [facility]) CM/SW Contact:  Alberteen Sam, LCSW Phone Number: 07/04/2022, 12:58 PM   Clinical Narrative:     CSW met with patient and husband at bedside, they are in agreement with [facility] PT OT and SLP recommendations, referral sent to [place] with [facility] they report having no staff. CSW sent referral to [facility] with [facility] they are able to accept.   RW ordered via [employer] to be delivered to bedside prior to discharge today, husband transporting at dc. No other needs.   Final next level of care: [place] Barriers to Discharge: No Barriers Identified   Patient Goals and CMS Choice Patient states their goals for this hospitalization and ongoing recovery are:: to go home CMS Medicare.gov Compare Post Acute Care list provided to:: Patient Choice offered to / list presented to : Patient  Discharge Placement                       Discharge Plan and Services                DME Arranged: Walker rolling DME Agency: [employer] Date DME Agency Contacted: 07/04/22 Time DME Agency Contacted: 66 Representative spoke with at DME Agency: Suanne Marker HH Arranged: PT, OT, Speech Therapy HH Agency: [facility] [place] Date [place]: 07/04/22      Social Determinants of Health ([place]) Interventions     Readmission Risk Interventions     No data to display

## 2022-07-04 NOTE — Discharge Summary (Signed)
Physician Discharge Summary   Patient: Sharon [name] MRN: [id number] DOB: [birth year]  Admit date:     07/02/2022  Discharge date: 07/04/22  Discharge Physician: Annita Brod   PCP: [name], NP   Recommendations at discharge:   New medication: Aspirin 81 mg p.o. daily x90 days New medication: Zetia 10 mg p.o. daily Patient going home with home health PT, OT and speech therapy  Discharge Diagnoses: Principal Problem:   Atheroembolic causing CVA (cerebral vascular accident) ([place]) of right MCA distribution Active Problems:   Chronic diastolic CHF (congestive heart failure) (HCC)   Prediabetes   Hyperlipidemia   Anxiety   Centrilobular emphysema ([place])   Morbid obesity ([place])  Resolved Problems:   * No resolved hospital problems. [facility] Course: 76 y.o. female with medical history significant for anxiety, history of CVA with residual left-sided weakness and COPD who presented to the ER on 9/20 with slurred speech and weakness of her right upper extremity.  CT scan of the head suggestive of an acute infarct.  Her symptoms have improved since her initial presentation, but still with some persistent weakness.  Patient was seen in the ED about a week ago for left-sided numbness and had extensive work-up, negative at that time for acute CVA.  Admitted for further workup.  Assessment and Plan: * Atheroembolic causing CVA (cerebral vascular accident) ([place]) of right MCA distribution Patient presents for evaluation of slurred speech and right arm weakness Both symptoms have improved, although weakness still present.  Patient found to have what looks to be an evolving acute right MCA distribution infarct.  Recently seen for left-sided numbness last week which resolved and was discharged home to follow-up with neurology as an outpatient.  CT angiogram of head and neck noting severe right MCA branch stenosis, and stroke felt to be atheroembolic from this location.  Patient already  on Plavix due to intolerance of aspirin from bruising.  Discussed with patient and she is willing to try 81 mg of aspirin x90 days in addition to continuing Plavix indefinitely.  Echocardiogram unrevealing.  Not on statins due to allergy, will try Zetia.  On Plavix 2019 due to episodes of bruising with aspirin.  MRI of the brain noted confirming above CT findings.  A1c at 6.0.  Patient does not smoke.  Has always had stable blood pressure.  Patient found to still have some residual dysphagia although cleared to take p.o. so she will be followed by speech therapy at home health.  Also seen by PT and OT who are recommending home health.  Chronic diastolic CHF (congestive heart failure) (HCC) Incidentally noted on echocardiogram.  Euvolemic.  Hyperlipidemia A1c at 110 with target below 70.  Not able to tolerate statins and has tried different kinds.  We will try Zetia.  Prediabetes A1c at 6.4 on admission  Anxiety Stable Continue Celexa  Centrilobular emphysema (HCC) Stable and not acutely exacerbated Continue as needed bronchodilator therapy  Morbid obesity ([place]) Counseled about weight loss.  Patient meets criteria with BMI greater than 35 and comorbidities of CVA         Consultants: Neurology Procedures performed:  -Echocardiogram noting grade 1 diastolic dysfunction Disposition: Home with home health Diet recommendation:  Discharge Diet Orders (From admission, onward)     Start     Ordered   07/04/22 0000  Diet - low sodium heart healthy        07/04/22 1155  Cardiac diet DISCHARGE MEDICATION: Allergies as of 07/04/2022       Reactions   Statins Other (See Comments)   myalgia   Dermacerin [aquaphilic]    Nsaids    Quinolones    FLUOROQUINOLONES - Pt reports she was told to NEVER take these.   Baclofen Swelling   Bactrim [sulfamethoxazole-trimethoprim] Nausea And Vomiting   Ibuprofen Rash   Mouth swelling   Lanolin Rash   Allergy to triamcinolone     Latex Rash   Some bandaids, some gloves; BLOOD TEST NEGATIVE   Librium [chlordiazepoxide] Itching   Dizziness   Naprosyn [naproxen] Rash   Mouth swelling   Other Rash   [place] nuts - mouth swelling        Medication List     TAKE these medications    acetaminophen 500 MG tablet Commonly known as: TYLENOL Take 500 mg by mouth every 6 (six) hours as needed.   albuterol 108 (90 Base) MCG/ACT inhaler Commonly known as: ProAir HFA Inhale 2 puffs into the lungs every 6 (six) hours as needed for wheezing or shortness of breath.   aspirin EC 81 MG tablet Take 1 tablet (81 mg total) by mouth daily. Swallow whole.   Breztri Aerosphere 160-9-4.8 MCG/ACT Aero Generic drug: Budeson-Glycopyrrol-Formoterol Inhale 2 puffs into the lungs 2 (two) times daily.   Cholecalciferol 1.25 MG (50000 UT) Tabs Take 1 tablet by mouth once a week.   citalopram 20 MG tablet Commonly known as: CELEXA Take 1 tablet (20 mg total) by mouth as needed.   clopidogrel 75 MG tablet Commonly known as: PLAVIX TAKE 1 TABLET(75 MG) BY MOUTH DAILY   Eucrisa 2 % Oint Generic drug: Crisaborole Apply 1 application. topically daily. Use for itching and inflammation at left leg   ezetimibe 10 MG tablet Commonly known as: Zetia Take 1 tablet (10 mg total) by mouth daily.   loratadine 10 MG tablet Commonly known as: CLARITIN Take 1 tablet (10 mg total) by mouth daily.   onetouch ultrasoft lancets Utilize to check blood sugar twice a day, fasting in morning with goal < 130 and then 2 hours after a meal with goal <180.  Document and bring to visits.   OneTouch Verio test strip Generic drug: glucose blood UTILIIZE TO CHECK BLOOD SUGAR TWICE DAILY FASTING IN MORNING WITH GOAL<130 AND THAN 2 HOURS AFTER A MEAL WITH GGOAL<180   OneTouch Verio w/Device Kit Utilize to check blood sugar twice a day, fasting in morning with goal < 130 and then 2 hours after a meal with goal <180.  Document and bring to visits.    pantoprazole 40 MG tablet Commonly known as: PROTONIX TAKE 1 TABLET(40 MG) BY MOUTH DAILY   phenazopyridine 100 MG tablet Commonly known as: Pyridium Take 1 tablet (100 mg total) by mouth 3 (three) times daily as needed for pain.               Durable Medical Equipment  (From admission, onward)           Start     Ordered   07/04/22 1246  For home use only DME Walker rolling  Once       Question Answer Comment  Walker: With 5 Inch Wheels   Patient needs a walker to treat with the following condition CVA (cerebral vascular accident) ([place])      07/04/22 1245            Discharge Exam: Filed Weights   07/02/22 2142  Weight:  102.1 kg   General: Alert and oriented x3, no acute distress Cardiovascular: Regular rate and rhythm, S1-S2 Lungs: Clear to auscultation bilaterally  Condition at discharge: good  The results of significant diagnostics from this hospitalization (including imaging, microbiology, ancillary and laboratory) are listed below for reference.   Imaging Studies: CT ANGIO HEAD NECK W WO CM  Result Date: 07/03/2022 CLINICAL DATA:  Stroke workup. EXAM: CT ANGIOGRAPHY HEAD AND NECK TECHNIQUE: Multidetector CT imaging of the head and neck was performed using the standard protocol during bolus administration of intravenous contrast. Multiplanar CT image reconstructions and MIPs were obtained to evaluate the vascular anatomy. Carotid stenosis measurements (when applicable) are obtained utilizing NASCET criteria, using the distal internal carotid diameter as the denominator. RADIATION DOSE REDUCTION: This exam was performed according to the departmental dose-optimization program which includes automated exposure control, adjustment of the mA and/or kV according to patient size and/or use of iterative reconstruction technique. CONTRAST:  83m OMNIPAQUE IOHEXOL 350 MG/ML SOLN COMPARISON:  MRI and MRA from earlier today FINDINGS: CT HEAD FINDINGS Brain: Known acute  infarct in the right MCA branch distribution, centered on the insula. No progression or hemorrhage. No hydrocephalus or masslike finding Vascular: No hyperdense vessel or unexpected calcification. Skull: Normal. Negative for fracture or focal lesion. Sinuses/Orbits: No acute finding. Review of the MIP images confirms the above findings CTA NECK FINDINGS Aortic arch: Elongation of the arch with dilatation at the isthmus/proximal descending segment measuring 4 cm. No progression when compared to 2020 chest CT Right carotid system: Limited atheromatous changes. No stenosis or ulceration. Negative for web. Left carotid system: Mild for age atherosclerosis mainly at the bifurcation. No flow limiting stenosis or ulceration. Vertebral arteries: Proximal subclavian atherosclerosis. The left vertebral artery is dominant. Both vertebral arteries are smoothly contoured and widely patent to the dura. Skeleton: Ordinary cervical spine degeneration. Other neck: Subcutaneous nodules in the bilateral cheek, likely dermal inclusion cyst. Upper chest: Mild Paraseptal emphysema at the apices. Review of the MIP images confirms the above findings CTA HEAD FINDINGS Anterior circulation: Atheromatous plaque on the carotid siphons without significant stenosis. Right MCA branch attenuation and irregularity correlating with the acute infarct location. Negative for aneurysm. Posterior circulation: Left dominant vertebral artery. Vertebral and basilar arteries are widely patent. No proximal branch occlusion or flow limiting stenosis. Negative for aneurysm Venous sinuses: Asymmetric absent flow in the left sigmoid sinus, long-standing when compared to 2019 MRI. Anatomic variants: As above Review of the MIP images confirms the above findings IMPRESSION: Head CT: No progression or hemorrhagic conversion of the right MCA branch infarct. CTA: 1. Diseased right MCA branch correlating with the acute infarct. There is atherosclerosis without flow  limiting stenosis or embolic source identified in the more proximal vasculature. 2. Chronic absent flow in the left sigmoid dural sinus, present since at least 2019. 3. Thoracic aortic aneurysm. No change when compared to a 2020 chest CT. Electronically Signed   By: JJorje GuildM.D.   On: 07/03/2022 17:44   ECHOCARDIOGRAM COMPLETE  Result Date: 07/03/2022    ECHOCARDIOGRAM REPORT   Patient Name:   MDAZIYA REDMONDDate of Exam: 07/03/2022 Medical Rec #:  [id number]    Height:       67.0 in Accession #:    [id number]   Weight:       225.1 lb Date of Birth:  [birth year]    BSA:          2.126 m Patient Age:  76 years      BP:           121/88 mmHg Patient Gender: F             HR:           82 bpm. Exam Location:  [facility] Procedure: 2D Echo, Color Doppler, Cardiac Doppler and Intracardiac            Opacification Agent Indications:     I63.9 Stroke  History:         Patient has prior history of Echocardiogram examinations, most                  recent 12/25/2021. COPD.  Sonographer:     Charmayne Sheer Referring Phys:  [id number] AGBATA Diagnosing Phys: Ida Rogue MD  Sonographer Comments: Technically difficult study due to poor echo windows. Image acquisition challenging due to patient body habitus and Image acquisition challenging due to COPD. IMPRESSIONS  1. Left ventricular ejection fraction, by estimation, is 55 to 60%. The left ventricle has normal function. The left ventricle has no regional wall motion abnormalities. Left ventricular diastolic parameters are consistent with Grade I diastolic dysfunction (impaired relaxation).  2. Right ventricular systolic function is normal. The right ventricular size is normal.  3. The mitral valve is normal in structure. No evidence of mitral valve regurgitation. No evidence of mitral stenosis.  4. The aortic valve is normal in structure. There is mild calcification of the aortic valve. Aortic valve regurgitation is not visualized. Aortic valve  sclerosis/calcification is present, without any evidence of aortic stenosis.  5. The inferior vena cava is normal in size with greater than 50% respiratory variability, suggesting right atrial pressure of 3 mmHg. FINDINGS  Left Ventricle: Left ventricular ejection fraction, by estimation, is 55 to 60%. The left ventricle has normal function. The left ventricle has no regional wall motion abnormalities. Definity contrast agent was given IV to delineate the left ventricular  endocardial borders. The left ventricular internal cavity size was normal in size. There is no left ventricular hypertrophy. Left ventricular diastolic parameters are consistent with Grade I diastolic dysfunction (impaired relaxation). Right Ventricle: The right ventricular size is normal. No increase in right ventricular wall thickness. Right ventricular systolic function is normal. Left Atrium: Left atrial size was normal in size. Right Atrium: Right atrial size was normal in size. Pericardium: There is no evidence of pericardial effusion. Mitral Valve: The mitral valve is normal in structure. No evidence of mitral valve regurgitation. No evidence of mitral valve stenosis. Tricuspid Valve: The tricuspid valve is normal in structure. Tricuspid valve regurgitation is not demonstrated. No evidence of tricuspid stenosis. Aortic Valve: The aortic valve is normal in structure. There is mild calcification of the aortic valve. Aortic valve regurgitation is not visualized. Aortic valve sclerosis/calcification is present, without any evidence of aortic stenosis. Aortic valve mean gradient measures 2.0 mmHg. Aortic valve peak gradient measures 4.2 mmHg. Aortic valve area, by VTI measures 1.95 cm. Pulmonic Valve: The pulmonic valve was normal in structure. Pulmonic valve regurgitation is not visualized. No evidence of pulmonic stenosis. Aorta: The aortic root is normal in size and structure. Venous: The inferior vena cava is normal in size with greater  than 50% respiratory variability, suggesting right atrial pressure of 3 mmHg. IAS/Shunts: No atrial level shunt detected by color flow Doppler.  LEFT VENTRICLE PLAX 2D LVIDd:         4.90 cm   Diastology LVIDs:  3.70 cm   LV e' medial:    7.51 cm/s LV PW:         0.80 cm   LV E/e' medial:  13.0 LV IVS:        0.90 cm   LV e' lateral:   7.40 cm/s LVOT diam:     2.00 cm   LV E/e' lateral: 13.2 LV SV:         41 LV SV Index:   19 LVOT Area:     3.14 cm  RIGHT VENTRICLE RV Basal diam:  3.10 cm LEFT ATRIUM           Index        RIGHT ATRIUM           Index LA diam:      2.80 cm 1.32 cm/m   RA Area:     14.40 cm LA Vol (A4C): 38.0 ml 17.87 ml/m  RA Volume:   36.30 ml  17.07 ml/m  AORTIC VALVE                    PULMONIC VALVE AV Area (Vmax):    2.24 cm     PV Vmax:       0.96 m/s AV Area (Vmean):   2.10 cm     PV Peak grad:  3.7 mmHg AV Area (VTI):     1.95 cm AV Vmax:           102.00 cm/s AV Vmean:          69.000 cm/s AV VTI:            0.209 m AV Peak Grad:      4.2 mmHg AV Mean Grad:      2.0 mmHg LVOT Vmax:         72.80 cm/s LVOT Vmean:        46.100 cm/s LVOT VTI:          0.130 m LVOT/AV VTI ratio: 0.62  AORTA Ao Root diam: 3.00 cm MITRAL VALVE MV Area (PHT): 3.74 cm     SHUNTS MV Decel Time: 203 msec     Systemic VTI:  0.13 m MV E velocity: 97.50 cm/s   Systemic Diam: 2.00 cm MV A velocity: 122.00 cm/s MV E/A ratio:  0.80 Ida Rogue MD Electronically signed by Ida Rogue MD Signature Date/Time: 07/03/2022/4:49:19 PM    Final    MR BRAIN WO CONTRAST  Result Date: 07/03/2022 CLINICAL DATA:  Initial evaluation for acute stroke. EXAM: MRI HEAD WITHOUT CONTRAST MRA HEAD WITHOUT CONTRAST MRA NECK WITHOUT AND WITH CONTRAST TECHNIQUE: Multiplanar, multi-echo pulse sequences of the brain and surrounding structures were acquired without intravenous contrast. Angiographic images of the Circle of Willis were acquired using MRA technique without intravenous contrast. Angiographic images of the  neck were acquired using MRA technique without and with intravenous contrast. Carotid stenosis measurements (when applicable) are obtained utilizing NASCET criteria, using the distal internal carotid diameter as the denominator. CONTRAST:  10 cc of Vueway COMPARISON:  CT from 07/02/2022. FINDINGS: MRI HEAD FINDINGS Brain: Cerebral volume stable, and remains within normal limits for age. Minimal chronic microvascular ischemic disease for age, also stable. Restricted diffusion seen involving the right insular cortex, consistent with acute right MCA territory infarct (series 5, image 23). Additional minimal patchy involvement of the right parietal cortex posteriorly (series 5, image 28). No associated hemorrhage or mass effect. Otherwise, gray-white matter differentiation maintained with no other areas of acute  or recent ischemia. No encephalomalacia to suggest chronic cortical infarction. No acute or chronic intracranial blood products. No mass lesion, midline shift or mass effect. No hydrocephalus or extra-axial fluid collection. Pituitary gland and suprasellar region normal. Vascular: Major intracranial vascular flow voids are maintained. Skull and upper cervical spine: Craniocervical junction normal. Bone marrow signal intensity normal. No scalp soft tissue abnormality. Sinuses/Orbits: Prior bilateral ocular lens replacement. Paranasal sinuses are largely clear. Small right with trace left mastoid effusions, of doubtful significance. Visualized nasopharynx unremarkable. Other: None. MRA HEAD FINDINGS Anterior circulation: Visualized distal cervical segments of the internal carotid arteries are patent with antegrade flow. Petrous, cavernous, and supraclinoid segments patent without stenosis. A1 segments, anterior communicating artery complex common anterior cerebral arteries patent without abnormality. No M1 stenosis or occlusion. Left MCA branches well perfused. On the right, there is occlusion of a proximal right  M2 branch, superior division, new as compared to recent MRA from 06/26/2022 (series 1, image 137). This is also seen on MIP reconstructions (series 1127, image 10). Remainder of the right MCA branches otherwise remain perfused. Posterior circulation: Both vertebral arteries patent without stenosis. Left vertebral artery dominant. Left PICA grossly patent at its origin. Right PICA not seen. Basilar patent without stenosis. Superior cerebellar arteries patent bilaterally. Both PCAs primarily supplied via the basilar, although a small right posterior communicating arteries noted. PCAs remain widely patent to their distal aspects without proximal stenosis. Anatomic variants: Dominant left vertebral artery.  No aneurysm. MRA NECK FINDINGS Aortic arch: Visualized aortic arch normal in caliber with normal 3 vessel morphology. No stenosis about the origin of the great vessels. Right carotid system: Right common and internal carotid arteries patent without dissection or occlusion. No significant atheromatous irregularity or narrowing about the right carotid bulb. Left carotid system: Left common and internal carotid arteries are patent without dissection or occlusion. No significant atheromatous irregularity or narrowing about the left carotid bulb. Vertebral arteries: Both vertebral arteries arise from the subclavian arteries. No visible proximal subclavian artery stenosis. Left vertebral artery dominant. Previously noted stenosis at the origin of the left vertebral artery is more mild to moderate in appearance on today's exam. Vertebral arteries otherwise patent without stenosis or evidence for dissection. Other: None IMPRESSION: MRI HEAD IMPRESSION: 1. Acute ischemic right MCA territory infarct involving the right insular cortex, with additional minimal involvement of the overlying right parietal cortex. No associated hemorrhage or mass effect. 2. Underlying minimal chronic microvascular ischemic disease for age. MRA HEAD  IMPRESSION: 1. Interval right M2 branch occlusion, superior division. Finding is consistent with the above described right MCA territory infarct. 2. Otherwise stable and wide patency of the major intracranial arterial vasculature. MRA NECK IMPRESSION: 1. Wide patency of both carotid artery systems within the neck. 2. Previously noted stenosis at the origin of the left vertebral artery is more mild to moderate in appearance on today's exam. Otherwise wide patency of both vertebral arteries within the neck. Left vertebral artery is dominant. Electronically Signed   By: Jeannine Boga M.D.   On: 07/03/2022 02:44   MR ANGIO HEAD WO CONTRAST  Result Date: 07/03/2022 CLINICAL DATA:  Initial evaluation for acute stroke. EXAM: MRI HEAD WITHOUT CONTRAST MRA HEAD WITHOUT CONTRAST MRA NECK WITHOUT AND WITH CONTRAST TECHNIQUE: Multiplanar, multi-echo pulse sequences of the brain and surrounding structures were acquired without intravenous contrast. Angiographic images of the Circle of Willis were acquired using MRA technique without intravenous contrast. Angiographic images of the neck were acquired using MRA technique without and  with intravenous contrast. Carotid stenosis measurements (when applicable) are obtained utilizing NASCET criteria, using the distal internal carotid diameter as the denominator. CONTRAST:  10 cc of Vueway COMPARISON:  CT from 07/02/2022. FINDINGS: MRI HEAD FINDINGS Brain: Cerebral volume stable, and remains within normal limits for age. Minimal chronic microvascular ischemic disease for age, also stable. Restricted diffusion seen involving the right insular cortex, consistent with acute right MCA territory infarct (series 5, image 23). Additional minimal patchy involvement of the right parietal cortex posteriorly (series 5, image 28). No associated hemorrhage or mass effect. Otherwise, gray-white matter differentiation maintained with no other areas of acute or recent ischemia. No  encephalomalacia to suggest chronic cortical infarction. No acute or chronic intracranial blood products. No mass lesion, midline shift or mass effect. No hydrocephalus or extra-axial fluid collection. Pituitary gland and suprasellar region normal. Vascular: Major intracranial vascular flow voids are maintained. Skull and upper cervical spine: Craniocervical junction normal. Bone marrow signal intensity normal. No scalp soft tissue abnormality. Sinuses/Orbits: Prior bilateral ocular lens replacement. Paranasal sinuses are largely clear. Small right with trace left mastoid effusions, of doubtful significance. Visualized nasopharynx unremarkable. Other: None. MRA HEAD FINDINGS Anterior circulation: Visualized distal cervical segments of the internal carotid arteries are patent with antegrade flow. Petrous, cavernous, and supraclinoid segments patent without stenosis. A1 segments, anterior communicating artery complex common anterior cerebral arteries patent without abnormality. No M1 stenosis or occlusion. Left MCA branches well perfused. On the right, there is occlusion of a proximal right M2 branch, superior division, new as compared to recent MRA from 06/26/2022 (series 1, image 137). This is also seen on MIP reconstructions (series 1127, image 10). Remainder of the right MCA branches otherwise remain perfused. Posterior circulation: Both vertebral arteries patent without stenosis. Left vertebral artery dominant. Left PICA grossly patent at its origin. Right PICA not seen. Basilar patent without stenosis. Superior cerebellar arteries patent bilaterally. Both PCAs primarily supplied via the basilar, although a small right posterior communicating arteries noted. PCAs remain widely patent to their distal aspects without proximal stenosis. Anatomic variants: Dominant left vertebral artery.  No aneurysm. MRA NECK FINDINGS Aortic arch: Visualized aortic arch normal in caliber with normal 3 vessel morphology. No stenosis  about the origin of the great vessels. Right carotid system: Right common and internal carotid arteries patent without dissection or occlusion. No significant atheromatous irregularity or narrowing about the right carotid bulb. Left carotid system: Left common and internal carotid arteries are patent without dissection or occlusion. No significant atheromatous irregularity or narrowing about the left carotid bulb. Vertebral arteries: Both vertebral arteries arise from the subclavian arteries. No visible proximal subclavian artery stenosis. Left vertebral artery dominant. Previously noted stenosis at the origin of the left vertebral artery is more mild to moderate in appearance on today's exam. Vertebral arteries otherwise patent without stenosis or evidence for dissection. Other: None IMPRESSION: MRI HEAD IMPRESSION: 1. Acute ischemic right MCA territory infarct involving the right insular cortex, with additional minimal involvement of the overlying right parietal cortex. No associated hemorrhage or mass effect. 2. Underlying minimal chronic microvascular ischemic disease for age. MRA HEAD IMPRESSION: 1. Interval right M2 branch occlusion, superior division. Finding is consistent with the above described right MCA territory infarct. 2. Otherwise stable and wide patency of the major intracranial arterial vasculature. MRA NECK IMPRESSION: 1. Wide patency of both carotid artery systems within the neck. 2. Previously noted stenosis at the origin of the left vertebral artery is more mild to moderate in appearance on today's  exam. Otherwise wide patency of both vertebral arteries within the neck. Left vertebral artery is dominant. Electronically Signed   By: Jeannine Boga M.D.   On: 07/03/2022 02:44   MR ANGIO NECK W WO CONTRAST  Result Date: 07/03/2022 CLINICAL DATA:  Initial evaluation for acute stroke. EXAM: MRI HEAD WITHOUT CONTRAST MRA HEAD WITHOUT CONTRAST MRA NECK WITHOUT AND WITH CONTRAST TECHNIQUE:  Multiplanar, multi-echo pulse sequences of the brain and surrounding structures were acquired without intravenous contrast. Angiographic images of the Circle of Willis were acquired using MRA technique without intravenous contrast. Angiographic images of the neck were acquired using MRA technique without and with intravenous contrast. Carotid stenosis measurements (when applicable) are obtained utilizing NASCET criteria, using the distal internal carotid diameter as the denominator. CONTRAST:  10 cc of Vueway COMPARISON:  CT from 07/02/2022. FINDINGS: MRI HEAD FINDINGS Brain: Cerebral volume stable, and remains within normal limits for age. Minimal chronic microvascular ischemic disease for age, also stable. Restricted diffusion seen involving the right insular cortex, consistent with acute right MCA territory infarct (series 5, image 23). Additional minimal patchy involvement of the right parietal cortex posteriorly (series 5, image 28). No associated hemorrhage or mass effect. Otherwise, gray-white matter differentiation maintained with no other areas of acute or recent ischemia. No encephalomalacia to suggest chronic cortical infarction. No acute or chronic intracranial blood products. No mass lesion, midline shift or mass effect. No hydrocephalus or extra-axial fluid collection. Pituitary gland and suprasellar region normal. Vascular: Major intracranial vascular flow voids are maintained. Skull and upper cervical spine: Craniocervical junction normal. Bone marrow signal intensity normal. No scalp soft tissue abnormality. Sinuses/Orbits: Prior bilateral ocular lens replacement. Paranasal sinuses are largely clear. Small right with trace left mastoid effusions, of doubtful significance. Visualized nasopharynx unremarkable. Other: None. MRA HEAD FINDINGS Anterior circulation: Visualized distal cervical segments of the internal carotid arteries are patent with antegrade flow. Petrous, cavernous, and supraclinoid  segments patent without stenosis. A1 segments, anterior communicating artery complex common anterior cerebral arteries patent without abnormality. No M1 stenosis or occlusion. Left MCA branches well perfused. On the right, there is occlusion of a proximal right M2 branch, superior division, new as compared to recent MRA from 06/26/2022 (series 1, image 137). This is also seen on MIP reconstructions (series 1127, image 10). Remainder of the right MCA branches otherwise remain perfused. Posterior circulation: Both vertebral arteries patent without stenosis. Left vertebral artery dominant. Left PICA grossly patent at its origin. Right PICA not seen. Basilar patent without stenosis. Superior cerebellar arteries patent bilaterally. Both PCAs primarily supplied via the basilar, although a small right posterior communicating arteries noted. PCAs remain widely patent to their distal aspects without proximal stenosis. Anatomic variants: Dominant left vertebral artery.  No aneurysm. MRA NECK FINDINGS Aortic arch: Visualized aortic arch normal in caliber with normal 3 vessel morphology. No stenosis about the origin of the great vessels. Right carotid system: Right common and internal carotid arteries patent without dissection or occlusion. No significant atheromatous irregularity or narrowing about the right carotid bulb. Left carotid system: Left common and internal carotid arteries are patent without dissection or occlusion. No significant atheromatous irregularity or narrowing about the left carotid bulb. Vertebral arteries: Both vertebral arteries arise from the subclavian arteries. No visible proximal subclavian artery stenosis. Left vertebral artery dominant. Previously noted stenosis at the origin of the left vertebral artery is more mild to moderate in appearance on today's exam. Vertebral arteries otherwise patent without stenosis or evidence for dissection. Other: None IMPRESSION: MRI  HEAD IMPRESSION: 1. Acute  ischemic right MCA territory infarct involving the right insular cortex, with additional minimal involvement of the overlying right parietal cortex. No associated hemorrhage or mass effect. 2. Underlying minimal chronic microvascular ischemic disease for age. MRA HEAD IMPRESSION: 1. Interval right M2 branch occlusion, superior division. Finding is consistent with the above described right MCA territory infarct. 2. Otherwise stable and wide patency of the major intracranial arterial vasculature. MRA NECK IMPRESSION: 1. Wide patency of both carotid artery systems within the neck. 2. Previously noted stenosis at the origin of the left vertebral artery is more mild to moderate in appearance on today's exam. Otherwise wide patency of both vertebral arteries within the neck. Left vertebral artery is dominant. Electronically Signed   By: Jeannine Boga M.D.   On: 07/03/2022 02:44   CT HEAD CODE STROKE WO CONTRAST  Result Date: 07/02/2022 CLINICAL DATA:  Code stroke. Initial evaluation for neuro deficit, slurred speech. EXAM: CT HEAD WITHOUT CONTRAST TECHNIQUE: Contiguous axial images were obtained from the base of the skull through the vertex without intravenous contrast. RADIATION DOSE REDUCTION: This exam was performed according to the departmental dose-optimization program which includes automated exposure control, adjustment of the mA and/or kV according to patient size and/or use of iterative reconstruction technique. COMPARISON:  Prior studies from 06/26/2022. FINDINGS: Brain: Age-related cerebral atrophy with chronic small vessel ischemic disease. No acute intracranial hemorrhage. Focal hypodensity involving the right insular cortex, suspicious for acute right MCA territory infarct (series 3, image 13). No other acute large vessel territory infarct. No mass lesion, midline shift or mass effect. No hydrocephalus or extra-axial fluid collection. Vascular: No visible hyperdense vessel. Skull: Scalp soft  tissues and calvarium within normal limits. Sinuses/Orbits: Globes orbital soft tissues within normal limits. Paranasal sinuses are clear. No significant mastoid effusion. Other: None. ASPECTS [facility] Stroke Program Early CT Score) - Ganglionic level infarction (caudate, lentiform nuclei, internal capsule, insula, M1-M3 cortex): 6 - Supraganglionic infarction (M4-M6 cortex): 3 Total score (0-10 with 10 being normal): 9 IMPRESSION: 1. Focal hypodensity involving the right insular cortex, suspicious for an evolving acute right MCA distribution infarct. No intracranial hemorrhage. 2. ASPECTS is 9. 3. Underlying atrophy with chronic small vessel ischemic disease. Critical Value/emergent results were called by telephone at the time of interpretation on 07/02/2022 at 9:47 pm to provider [facility] , who verbally acknowledged these results. Electronically Signed   By: Jeannine Boga M.D.   On: 07/02/2022 21:50   MR BRAIN WO CONTRAST  Result Date: 06/26/2022 CLINICAL DATA:  Neuro deficit, acute, stroke suspected. Numbness and tingling involving the left arm and face. EXAM: MRI HEAD WITHOUT CONTRAST MRA HEAD WITHOUT CONTRAST MRA OF THE NECK WITHOUT AND WITH CONTRAST TECHNIQUE: Multiplanar, multi-echo pulse sequences of the brain and surrounding structures were acquired without intravenous contrast. Angiographic images of the Circle of Willis were acquired using MRA technique without intravenous contrast. Angiographic images of the neck were acquired using MRA technique without and with intravenous contrast. Carotid stenosis measurements (when applicable) are obtained utilizing NASCET criteria, using the distal internal carotid diameter as the denominator. CONTRAST:  75m GADAVIST GADOBUTROL 1 MMOL/ML IV SOLN COMPARISON:  Head CT 06/26/2022. Head MRI 01/07/2018. Head MRA 01/05/2018. FINDINGS: MR HEAD FINDINGS Brain: There is a small focus of T2 shine through on diffusion-weighted imaging in the subcortical  white matter of the posterior right frontal lobe which corresponds to an area of acute ischemia in 2019. No acute infarct is evident today. No intracranial hemorrhage, mass,  midline shift, or extra-axial fluid collection is identified. Scattered small T2 hyperintensities in the cerebral white matter bilaterally are similar to the prior MRI and are nonspecific but compatible with minimal chronic small vessel ischemic disease, not considered abnormal for age. The ventricles and sulci are normal. Vascular: Major intracranial vascular flow voids are preserved. Skull and upper cervical spine: Unremarkable bone marrow signal. Sinuses/Orbits: Bilateral cataract extraction. Clear paranasal sinuses. Trace right mastoid effusion. Other: None. MRA HEAD FINDINGS Anterior circulation: The internal carotid arteries are widely patent from skull base to carotid termini. ACAs and MCAs are patent with mild branch vessel irregularity but no evidence of a proximal branch occlusion or significant proximal stenosis. No aneurysm is identified. Posterior circulation: The intracranial vertebral arteries are patent to the basilar with the left being dominant. Patent AICA and SCA origins are seen bilaterally. The basilar artery is widely patent. There is a small right posterior communicating artery. Both PCAs are patent without evidence of a significant proximal stenosis. No aneurysm is identified. Anatomic variants: None. MRA NECK FINDINGS Aortic arch: Standard 3 vessel aortic arch with widely patent arch vessel origins. Right carotid system: Patent without evidence of stenosis or dissection. Left carotid system: Patent without evidence of stenosis or dissection. Vertebral arteries: Patent with antegrade flow bilaterally. Moderately dominant left vertebral artery. Mild-to-moderate stenosis versus artifact in the proximal right V3 segment. Appearance severe stenosis of the left vertebral artery origin. IMPRESSION: 1. No acute intracranial  abnormality. 2. No major intracranial arterial occlusion or significant proximal stenosis. 3. Severe left vertebral artery origin stenosis. 4. Widely patent cervical carotid arteries. Electronically Signed   By: Logan Bores M.D.   On: 06/26/2022 18:22   MR ANGIO HEAD WO CONTRAST  Result Date: 06/26/2022 CLINICAL DATA:  Neuro deficit, acute, stroke suspected. Numbness and tingling involving the left arm and face. EXAM: MRI HEAD WITHOUT CONTRAST MRA HEAD WITHOUT CONTRAST MRA OF THE NECK WITHOUT AND WITH CONTRAST TECHNIQUE: Multiplanar, multi-echo pulse sequences of the brain and surrounding structures were acquired without intravenous contrast. Angiographic images of the Circle of Willis were acquired using MRA technique without intravenous contrast. Angiographic images of the neck were acquired using MRA technique without and with intravenous contrast. Carotid stenosis measurements (when applicable) are obtained utilizing NASCET criteria, using the distal internal carotid diameter as the denominator. CONTRAST:  22m GADAVIST GADOBUTROL 1 MMOL/ML IV SOLN COMPARISON:  Head CT 06/26/2022. Head MRI 01/07/2018. Head MRA 01/05/2018. FINDINGS: MR HEAD FINDINGS Brain: There is a small focus of T2 shine through on diffusion-weighted imaging in the subcortical white matter of the posterior right frontal lobe which corresponds to an area of acute ischemia in 2019. No acute infarct is evident today. No intracranial hemorrhage, mass, midline shift, or extra-axial fluid collection is identified. Scattered small T2 hyperintensities in the cerebral white matter bilaterally are similar to the prior MRI and are nonspecific but compatible with minimal chronic small vessel ischemic disease, not considered abnormal for age. The ventricles and sulci are normal. Vascular: Major intracranial vascular flow voids are preserved. Skull and upper cervical spine: Unremarkable bone marrow signal. Sinuses/Orbits: Bilateral cataract extraction.  Clear paranasal sinuses. Trace right mastoid effusion. Other: None. MRA HEAD FINDINGS Anterior circulation: The internal carotid arteries are widely patent from skull base to carotid termini. ACAs and MCAs are patent with mild branch vessel irregularity but no evidence of a proximal branch occlusion or significant proximal stenosis. No aneurysm is identified. Posterior circulation: The intracranial vertebral arteries are patent to the basilar with  the left being dominant. Patent AICA and SCA origins are seen bilaterally. The basilar artery is widely patent. There is a small right posterior communicating artery. Both PCAs are patent without evidence of a significant proximal stenosis. No aneurysm is identified. Anatomic variants: None. MRA NECK FINDINGS Aortic arch: Standard 3 vessel aortic arch with widely patent arch vessel origins. Right carotid system: Patent without evidence of stenosis or dissection. Left carotid system: Patent without evidence of stenosis or dissection. Vertebral arteries: Patent with antegrade flow bilaterally. Moderately dominant left vertebral artery. Mild-to-moderate stenosis versus artifact in the proximal right V3 segment. Appearance severe stenosis of the left vertebral artery origin. IMPRESSION: 1. No acute intracranial abnormality. 2. No major intracranial arterial occlusion or significant proximal stenosis. 3. Severe left vertebral artery origin stenosis. 4. Widely patent cervical carotid arteries. Electronically Signed   By: Logan Bores M.D.   On: 06/26/2022 18:22   MR ANGIO NECK W WO CONTRAST  Result Date: 06/26/2022 CLINICAL DATA:  Neuro deficit, acute, stroke suspected. Numbness and tingling involving the left arm and face. EXAM: MRI HEAD WITHOUT CONTRAST MRA HEAD WITHOUT CONTRAST MRA OF THE NECK WITHOUT AND WITH CONTRAST TECHNIQUE: Multiplanar, multi-echo pulse sequences of the brain and surrounding structures were acquired without intravenous contrast. Angiographic images  of the Circle of Willis were acquired using MRA technique without intravenous contrast. Angiographic images of the neck were acquired using MRA technique without and with intravenous contrast. Carotid stenosis measurements (when applicable) are obtained utilizing NASCET criteria, using the distal internal carotid diameter as the denominator. CONTRAST:  57m GADAVIST GADOBUTROL 1 MMOL/ML IV SOLN COMPARISON:  Head CT 06/26/2022. Head MRI 01/07/2018. Head MRA 01/05/2018. FINDINGS: MR HEAD FINDINGS Brain: There is a small focus of T2 shine through on diffusion-weighted imaging in the subcortical white matter of the posterior right frontal lobe which corresponds to an area of acute ischemia in 2019. No acute infarct is evident today. No intracranial hemorrhage, mass, midline shift, or extra-axial fluid collection is identified. Scattered small T2 hyperintensities in the cerebral white matter bilaterally are similar to the prior MRI and are nonspecific but compatible with minimal chronic small vessel ischemic disease, not considered abnormal for age. The ventricles and sulci are normal. Vascular: Major intracranial vascular flow voids are preserved. Skull and upper cervical spine: Unremarkable bone marrow signal. Sinuses/Orbits: Bilateral cataract extraction. Clear paranasal sinuses. Trace right mastoid effusion. Other: None. MRA HEAD FINDINGS Anterior circulation: The internal carotid arteries are widely patent from skull base to carotid termini. ACAs and MCAs are patent with mild branch vessel irregularity but no evidence of a proximal branch occlusion or significant proximal stenosis. No aneurysm is identified. Posterior circulation: The intracranial vertebral arteries are patent to the basilar with the left being dominant. Patent AICA and SCA origins are seen bilaterally. The basilar artery is widely patent. There is a small right posterior communicating artery. Both PCAs are patent without evidence of a significant  proximal stenosis. No aneurysm is identified. Anatomic variants: None. MRA NECK FINDINGS Aortic arch: Standard 3 vessel aortic arch with widely patent arch vessel origins. Right carotid system: Patent without evidence of stenosis or dissection. Left carotid system: Patent without evidence of stenosis or dissection. Vertebral arteries: Patent with antegrade flow bilaterally. Moderately dominant left vertebral artery. Mild-to-moderate stenosis versus artifact in the proximal right V3 segment. Appearance severe stenosis of the left vertebral artery origin. IMPRESSION: 1. No acute intracranial abnormality. 2. No major intracranial arterial occlusion or significant proximal stenosis. 3. Severe left vertebral artery  origin stenosis. 4. Widely patent cervical carotid arteries. Electronically Signed   By: Logan Bores M.D.   On: 06/26/2022 18:22   CT HEAD CODE STROKE WO CONTRAST  Result Date: 06/26/2022 CLINICAL DATA:  Code stroke. Neuro deficit, acute, stroke suspected; left facial numbness EXAM: CT HEAD WITHOUT CONTRAST TECHNIQUE: Contiguous axial images were obtained from the base of the skull through the vertex without intravenous contrast. RADIATION DOSE REDUCTION: This exam was performed according to the departmental dose-optimization program which includes automated exposure control, adjustment of the mA and/or kV according to patient size and/or use of iterative reconstruction technique. COMPARISON:  March 2019 FINDINGS: Brain: There is no acute intracranial hemorrhage, mass effect, or edema. No new loss of gray-white differentiation. Prominence of the ventricles and sulci reflects stable minor parenchymal volume loss. No extra-axial collection. Vascular: No hyperdense vessel. There is intracranial atherosclerotic calcification at the skull base. Skull: Unremarkable. Sinuses/Orbits: No acute finding. Other: Mastoid air cells are clear. ASPECTS ([place] Stroke Program Early CT Score) - Ganglionic level infarction  (caudate, lentiform nuclei, internal capsule, insula, M1-M3 cortex): 7 - Supraganglionic infarction (M4-M6 cortex): 3 Total score (0-10 with 10 being normal): 10 IMPRESSION: There is no acute intracranial hemorrhage or evidence of acute infarction. ASPECT score is 10. These results were communicated to Dr. Leonel Ramsay at 12:27 pm on 06/26/2022 by text page via the [facility] messaging system. Electronically Signed   By: Macy Mis M.D.   On: 06/26/2022 12:31    Microbiology: Results for orders placed or performed in visit on 06/27/22  Microscopic Examination     Status: Abnormal   Collection Time: 06/27/22  3:43 PM   Urine  Result Value Ref Range Status   WBC, UA >30W 0 - 5 /hpf Final   RBC, Urine 0-2 0 - 2 /hpf Final   Epithelial Cells (non renal) 0-10 0 - 10 /hpf Final   Mucus, UA Present (A) Not Estab. Final   Bacteria, UA Few (A) None seen/Few Final   *Note: Due to a large number of results and/or encounters for the requested time period, some results have not been displayed. A complete set of results can be found in Results Review.    Labs: CBC: Recent Labs  Lab 07/02/22 2145  WBC 10.0  NEUTROABS 7.4  HGB 14.8  HCT 45.1  MCV 91.3  PLT 102   Basic Metabolic Panel: Recent Labs  Lab 07/02/22 2145 07/04/22 0449  NA 139 141  K 4.2 3.8  CL 106 108  CO2 24 25  GLUCOSE 146* 120*  BUN 23 15  CREATININE 1.15* 0.82  CALCIUM 9.3 9.0   Liver Function Tests: Recent Labs  Lab 07/02/22 2145  AST 20  ALT 17  ALKPHOS 75  BILITOT 0.7  PROT 7.4  ALBUMIN 4.1   CBG: Recent Labs  Lab 07/03/22 1601 07/03/22 2036 07/04/22 0027 07/04/22 0818 07/04/22 1159  GLUCAP 97 148* 147* 129* 127*    Discharge time spent: less than 30 minutes.  Signed: Annita Brod, MD [facility] 07/04/2022

## 2022-07-04 NOTE — Assessment & Plan Note (Signed)
Incidentally noted on echocardiogram.  Euvolemic. 

## 2022-07-04 NOTE — Progress Notes (Signed)
Occupational Therapy Treatment Patient Details Name: Sharon [name] MRN: [id number] DOB: 10-12-1946 Today's Date: 07/04/2022   History of present illness Pt is a 76 year old female presenting to the ED with R arm weakness and slurred speech with MRI showing Acute ischemic right MCA territory infarct involving the right insular cortex, with additional minimal involvement of the overlying  right parietal cortex; PMH significant for history of CVA with residual left-sided weakness, history of COPD   OT comments  Chart reviewed, pt greeted in room agreeable to OT tx session. Tx session targeted improving safe indep completion of ADLs. Improvements noted in ADL tasks completed at sink with RW with supervision, functional amb with RW in room with supervision. Good safety awareness throughout. Discharge recommendation remains appropriate. OT will continue to follow acutely.    Recommendations for follow up therapy are one component of a multi-disciplinary discharge planning process, led by the attending physician.  Recommendations may be updated based on patient status, additional functional criteria and insurance authorization.    Follow Up Recommendations  Home health OT    Assistance Recommended at Discharge Intermittent Supervision/Assistance  Patient can return home with the following  A little help with walking and/or transfers;Assistance with cooking/housework;A little help with bathing/dressing/bathroom   Equipment Recommendations  None recommended by OT    Recommendations for Other Services      Precautions / Restrictions Precautions Precautions: Fall Restrictions Weight Bearing Restrictions: No       Mobility Bed Mobility Overal bed mobility: Modified Independent                  Transfers Overall transfer level: Needs assistance Equipment used: Rolling walker (2 wheels) Transfers: Sit to/from Stand Sit to Stand: Supervision                 Balance Overall  balance assessment: Needs assistance Sitting-balance support: Feet supported Sitting balance-Leahy Scale: Good     Standing balance support: Bilateral upper extremity supported, During functional activity Standing balance-Leahy Scale: Fair                             ADL either performed or assessed with clinical judgement   ADL Overall ADL's : Needs assistance/impaired     Grooming: Wash/dry hands;Wash/dry face;Standing;Supervision/safety Grooming Details (indicate cue type and reason): sink level with RW         Upper Body Dressing : Standing   Lower Body Dressing: Moderate assistance;Sit to/from stand   Toilet Transfer: Supervision/safety;Rolling walker (2 wheels) Toilet Transfer Details (indicate cue type and reason): simulated         Functional mobility during ADLs: Supervision/safety;Rolling walker (2 wheels) General ADL Comments: intermittent vcs for safety    Extremity/Trunk Assessment              Vision       Perception     Praxis      Cognition Arousal/Alertness: Awake/alert Behavior During Therapy: WFL for tasks assessed/performed Overall Cognitive Status: Within Functional Limits for tasks assessed                                          Exercises      Shoulder Instructions       General Comments      Pertinent Vitals/ Pain       Pain Assessment  Pain Assessment: No/denies pain  Home Living                                          Prior Functioning/Environment              Frequency  Min 3X/week        Progress Toward Goals  OT Goals(current goals can now be found in the care plan section)  Progress towards OT goals: Progressing toward goals     Plan Discharge plan remains appropriate    Co-evaluation                 AM-PAC OT "6 Clicks" Daily Activity     Outcome Measure   Help from another person eating meals?: None Help from another person taking  care of personal grooming?: None Help from another person toileting, which includes using toliet, bedpan, or urinal?: A Little Help from another person bathing (including washing, rinsing, drying)?: A Little Help from another person to put on and taking off regular upper body clothing?: None Help from another person to put on and taking off regular lower body clothing?: A Little 6 Click Score: 21    End of Session Equipment Utilized During Treatment: Gait belt;Rolling walker (2 wheels)  OT Visit Diagnosis: Unsteadiness on feet (R26.81)   Activity Tolerance Patient tolerated treatment well   Patient Left in chair;with call bell/phone within reach   Nurse Communication Mobility status        Time: 7322-0254 OT Time Calculation (min): 17 min  Charges: OT General Charges $OT Visit: 1 Visit OT Treatments $Self Care/Home Management : 8-22 mins Shanon Payor, OTD OTR/L  07/04/22, 12:26 PM

## 2022-07-04 NOTE — Progress Notes (Addendum)
Neurology Progress Note  Subjective: -Feels the symptoms are stable, no acute complaints  Exam: Current vital signs: BP 125/63 (BP Location: Right Arm)   Pulse 69   Temp 97.6 F (36.4 C)   Resp 16   Ht 5' 7"$  (1.702 m)   Wt 102.1 kg   LMP  (LMP Unknown)   SpO2 97%   BMI 35.25 kg/m  Vital signs in last 24 hours: Temp:  [97.5 F (36.4 C)-98.2 F (36.8 C)] 97.6 F (36.4 C) (09/22 0755) Pulse Rate:  [69-99] 69 (09/22 0755) Resp:  [16-23] 16 (09/22 0755) BP: (101-135)/(54-81) 125/63 (09/22 0755) SpO2:  [92 %-100 %] 97 % (09/22 0755)   Gen: In bed, comfortable  Resp: non-labored breathing, no grossly audible wheezing Cardiac: Perfusing extremities well  Abd: soft, nt  Neuro: Awake, alert, oriented to person, place, situation, speech is fluent Ambulating with a walker steadily, grossly equal using all 4 extremities, appreciate OT at bedside   Pertinent Data:  Basic Metabolic Panel: Recent Labs  Lab 07/02/22 2145 07/04/22 0449  NA 139 141  K 4.2 3.8  CL 106 108  CO2 24 25  GLUCOSE 146* 120*  BUN 23 15  CREATININE 1.15* 0.82  CALCIUM 9.3 9.0    CBC: Recent Labs  Lab 07/02/22 2145  WBC 10.0  NEUTROABS 7.4  HGB 14.8  HCT 45.1  MCV 91.3  PLT 233    Coagulation Studies: Recent Labs    07/02/22 2145  LABPROT 13.1  INR 1.0     ECHO  1. Left ventricular ejection fraction, by estimation, is 55 to 60%. The  left ventricle has normal function. The left ventricle has no regional  wall motion abnormalities. Left ventricular diastolic parameters are  consistent with Grade I diastolic dysfunction (impaired relaxation).   2. Right ventricular systolic function is normal. The right ventricular  size is normal.   3. The mitral valve is normal in structure. No evidence of mitral valve  regurgitation. No evidence of mitral stenosis.   4. The aortic valve is normal in structure. There is mild calcification  of the aortic valve. Aortic valve regurgitation is not  visualized. Aortic  valve sclerosis/calcification is present, without any evidence of aortic  stenosis.   5. The inferior vena cava is normal in size with greater than 50%  Normal biatrial sizes Respiratory variability, suggesting right atrial pressure of 3 mmHg.  Normal biatrial sizes  CTA personally reviewed and agree with radiology, and reviewed with patient at bedside: 1. Diseased right MCA branch correlating with the acute infarct. There is atherosclerosis without flow limiting stenosis or embolic source identified in the more proximal vasculature. 2. Chronic absent flow in the left sigmoid dural sinus, present since at least 2019. 3. Thoracic aortic aneurysm. No change when compared to a 2020 chest CT.    Impression: Atheroembolic infarct most likely etiology in the setting of severe right MCA branch stenosis.  I do suspect this may have been the etiology of her prior infarct as well given location and lack of prior CTA.  On MRA this appears to be an occlusion secondary to modality --  MRA often shows a severe stenoses as occlusion due to technical aspects of the evaluation  Recommendations: -Continue home Plavix 75 mg daily -Aspirin 81 mg daily for 90-day course due to symptomatic intracranial stenosis  -Appreciate primary team/outpatient teams assistance with trying to optimize the patient's cholesterol -Goal normotension -Neurology will be available as needed moving forward please reach out with  questions or concerns  Lesleigh Noe MD-PhD [facility] [phone]   [facility] coverage for [facility] is from 8 AM to 4 AM in-house and 4 PM to 8 PM by telephone/video. 8 PM to 8 AM emergent questions or overnight urgent questions should be addressed to Teleneurology On-call or [facility] neurohospitalist; contact information can be found on AMION

## 2022-07-04 NOTE — Telephone Encounter (Signed)
Pt c/o medication issue:  1. Name of Medication: ezetimibe (ZETIA) 10 MG tablet  2. How are you currently taking this medication (dosage and times per day)?  Take 1 tablet (10 mg total) by mouth daily.  3. Are you having a reaction (difficulty breathing--STAT)? no  4. What is your medication issue? Patient states she is allergic to statins.  She wants to make sure this medication is safe to take, before she fills the script.

## 2022-07-07 NOTE — Telephone Encounter (Signed)
Medication is not a statin and works in a different way. Should be ok to take but she should call us with any adverse effects

## 2022-07-08 ENCOUNTER — Telehealth: Payer: Self-pay

## 2022-07-08 ENCOUNTER — Telehealth: Payer: Self-pay | Admitting: *Deleted

## 2022-07-08 DIAGNOSIS — J432 Centrilobular emphysema: Secondary | ICD-10-CM | POA: Diagnosis not present

## 2022-07-08 DIAGNOSIS — R7303 Prediabetes: Secondary | ICD-10-CM | POA: Diagnosis not present

## 2022-07-08 DIAGNOSIS — I69354 Hemiplegia and hemiparesis following cerebral infarction affecting left non-dominant side: Secondary | ICD-10-CM | POA: Diagnosis not present

## 2022-07-08 DIAGNOSIS — Z7902 Long term (current) use of antithrombotics/antiplatelets: Secondary | ICD-10-CM | POA: Diagnosis not present

## 2022-07-08 DIAGNOSIS — Z7982 Long term (current) use of aspirin: Secondary | ICD-10-CM | POA: Diagnosis not present

## 2022-07-08 DIAGNOSIS — I69328 Other speech and language deficits following cerebral infarction: Secondary | ICD-10-CM | POA: Diagnosis not present

## 2022-07-08 DIAGNOSIS — E785 Hyperlipidemia, unspecified: Secondary | ICD-10-CM | POA: Diagnosis not present

## 2022-07-08 DIAGNOSIS — I69331 Monoplegia of upper limb following cerebral infarction affecting right dominant side: Secondary | ICD-10-CM | POA: Diagnosis not present

## 2022-07-08 DIAGNOSIS — I5032 Chronic diastolic (congestive) heart failure: Secondary | ICD-10-CM | POA: Diagnosis not present

## 2022-07-08 DIAGNOSIS — Z9181 History of falling: Secondary | ICD-10-CM | POA: Diagnosis not present

## 2022-07-08 NOTE — Telephone Encounter (Signed)
Transition Care Management Follow-up Telephone Call Date of discharge and from where: [facility] 07-04-2022 How have you been since you were released from the hospital?  Still feeling weak Any questions or concerns? No  Items Reviewed: Did the pt receive and understand the discharge instructions provided? Yes  Medications obtained and verified? Yes  Other? No  Any new allergies since your discharge? No  Dietary orders reviewed? No Do you have support at home? Yes   Home Care and Equipment/Supplies: Were home health services ordered? yes If so, what is the name of the agency? [facility]  Has the agency set up a time to come to the patient's home? yes Were any new equipment or medical supplies ordered?  No What is the name of the medical supply agency?  Were you able to get the supplies/equipment? no Do you have any questions related to the use of the equipment or supplies? No  Functional Questionnaire: (I = Independent and D = Dependent) ADLs: I/D  Bathing/Dressing- I/D  Meal Prep- D  Eating- I/D  Maintaining continence- I  Transferring/Ambulation- D  Managing Meds- I  Follow up appointments reviewed:  PCP Hospital f/u appt confirmed? Yes  Scheduled to see 07-16-2022 on 2:40 @ . [facility] f/u appt confirmed? No  . Are transportation arrangements needed? No  If their condition worsens, is the pt aware to call PCP or go to the Emergency Dept.? Yes Was the patient provided with contact information for the PCP's office or ED? Yes Was to pt encouraged to call back with questions or concerns? Yes

## 2022-07-08 NOTE — Telephone Encounter (Signed)
Copied from [place] [id number]. Topic: Quick Communication - Home Health Verbal Orders >> Jul 08, 2022  1:07 PM Leilani Able wrote: Caller/Agency: [facility] Callback Number: [phone] Requesting PT Frequency: 1 time a wk for 8 wks

## 2022-07-08 NOTE — Telephone Encounter (Signed)
Patient stated she has been on zetia for 4 days and has not had a reaction. She wanted to know the medications that R. Dunn said she should avoid. Referred her to the last appointment notes on  Assessment and Plan #1. Reviewed the names of the drugs with patient.

## 2022-07-08 NOTE — Telephone Encounter (Signed)
Spoke with Soni from [facility] and provided him with verbal OK for orders for patient. Soni verbalized understanding and has no further questions.

## 2022-07-11 NOTE — Patient Instructions (Incomplete)
Stroke Prevention Some medical conditions and lifestyle choices can lead to a higher risk for a stroke. You can help to prevent a stroke by eating healthy foods and exercising. It also helps to not smoke and to manage any health problems you may have. How can this condition affect me? A stroke is an emergency. It should be treated right away. A stroke can lead to brain damage or threaten your life. There is a better chance of surviving and getting better after a stroke if you get medical help right away. What can increase my risk? The following medical conditions may increase your risk of a stroke: Diseases of the heart and blood vessels (cardiovascular disease). High blood pressure (hypertension). Diabetes. High cholesterol. Sickle cell disease. Problems with blood clotting. Being very overweight. Sleeping problems (obstructivesleep apnea). Other risk factors include: Being older than age 19. A history of blood clots, stroke, or mini-stroke (TIA). Race, ethnic background, or a family history of stroke. Smoking or using tobacco products. Taking birth control pills, especially if you smoke. Heavy alcohol and drug use. Not being active. What actions can I take to prevent this? Manage your health conditions High cholesterol. Eat a healthy diet. If this is not enough to manage your cholesterol, you may need to take medicines. Take medicines as told by your doctor. High blood pressure. Try to keep your blood pressure below 130/80. If your blood pressure cannot be managed through a healthy diet and regular exercise, you may need to take medicines. Take medicines as told by your doctor. Ask your doctor if you should check your blood pressure at home. Have your blood pressure checked every year. Diabetes. Eat a healthy diet and get regular exercise. If your blood sugar (glucose) cannot be managed through diet and exercise, you may need to take medicines. Take medicines as told by your  doctor. Talk to your doctor about getting checked for sleeping problems. Signs of a problem can include: Snoring a lot. Feeling very tired. Make sure that you manage any other conditions you have. Nutrition  Follow instructions from your doctor about what to eat or drink. You may be told to: Eat and drink fewer calories each day. Limit how much salt (sodium) you use to 1,500 milligrams (mg) each day. Use only healthy fats for cooking, such as olive oil, canola oil, and sunflower oil. Eat healthy foods. To do this: Choose foods that are high in fiber. These include whole grains, and fresh fruits and vegetables. Eat at least 5 servings of fruits and vegetables a day. Try to fill one-half of your plate with fruits and vegetables at each meal. Choose low-fat (lean) proteins. These include low-fat cuts of meat, chicken without skin, fish, tofu, beans, and nuts. Eat low-fat dairy products. Avoid foods that: Are high in salt. Have saturated fat. Have trans fat. Have cholesterol. Are processed or pre-made. Count how many carbohydrates you eat and drink each day. Lifestyle If you drink alcohol: Limit how much you have to: 0-1 drink a day for women who are not pregnant. 0-2 drinks a day for men. Know how much alcohol is in your drink. In the [place] one drink equals one 12 oz bottle of beer (334m), one 5 oz glass of wine (1425m, or one 1 oz glass of hard liquor (445m Do not smoke or use any products that have nicotine or tobacco. If you need help quitting, ask your doctor. Avoid secondhand smoke. Do not use drugs. Activity  Try to stay at a  healthy weight. Get at least 30 minutes of exercise on most days, such as: Fast walking. Biking. Swimming. Medicines Take over-the-counter and prescription medicines only as told by your doctor. Avoid taking birth control pills. Talk to your doctor about the risks of taking birth control pills if: You are over 35 years old. You smoke. You get  very bad headaches. You have had a blood clot. Where to find more information American Stroke Association: www.strokeassociation.org Get help right away if: You or a loved one has any signs of a stroke. "BE FAST" is an easy way to remember the warning signs: B - Balance. Dizziness, sudden trouble walking, or loss of balance. E - Eyes. Trouble seeing or a change in how you see. F - Face. Sudden weakness or loss of feeling of the face. The face or eyelid may droop on one side. A - Arms. Weakness or loss of feeling in an arm. This happens all of a sudden and most often on one side of the body. S - Speech. Sudden trouble speaking, slurred speech, or trouble understanding what people say. T - Time. Time to call emergency services. Write down what time symptoms started. You or a loved one has other signs of a stroke, such as: A sudden, very bad headache with no known cause. Feeling like you may vomit (nausea). Vomiting. A seizure. These symptoms may be an emergency. Get help right away. Call your local emergency services (911 in the [place] Do not wait to see if the symptoms will go away. Do not drive yourself to the hospital. Summary You can help to prevent a stroke by eating healthy, exercising, and not smoking. It also helps to manage any health problems you have. Do not smoke or use any products that contain nicotine or tobacco. Get help right away if you or a loved one has any signs of a stroke. This information is not intended to replace advice given to you by your health care provider. Make sure you discuss any questions you have with your health care provider. Document Revised: 04/30/2020 Document Reviewed: 04/30/2020 Elsevier Patient Education  2023 Elsevier Inc.  

## 2022-07-14 DIAGNOSIS — I69331 Monoplegia of upper limb following cerebral infarction affecting right dominant side: Secondary | ICD-10-CM | POA: Diagnosis not present

## 2022-07-14 DIAGNOSIS — R7303 Prediabetes: Secondary | ICD-10-CM | POA: Diagnosis not present

## 2022-07-14 DIAGNOSIS — I69328 Other speech and language deficits following cerebral infarction: Secondary | ICD-10-CM | POA: Diagnosis not present

## 2022-07-14 DIAGNOSIS — Z7982 Long term (current) use of aspirin: Secondary | ICD-10-CM | POA: Diagnosis not present

## 2022-07-14 DIAGNOSIS — I5032 Chronic diastolic (congestive) heart failure: Secondary | ICD-10-CM | POA: Diagnosis not present

## 2022-07-14 DIAGNOSIS — E785 Hyperlipidemia, unspecified: Secondary | ICD-10-CM | POA: Diagnosis not present

## 2022-07-14 DIAGNOSIS — I69354 Hemiplegia and hemiparesis following cerebral infarction affecting left non-dominant side: Secondary | ICD-10-CM | POA: Diagnosis not present

## 2022-07-14 DIAGNOSIS — Z9181 History of falling: Secondary | ICD-10-CM | POA: Diagnosis not present

## 2022-07-14 DIAGNOSIS — Z7902 Long term (current) use of antithrombotics/antiplatelets: Secondary | ICD-10-CM | POA: Diagnosis not present

## 2022-07-14 DIAGNOSIS — J432 Centrilobular emphysema: Secondary | ICD-10-CM | POA: Diagnosis not present

## 2022-07-16 ENCOUNTER — Encounter: Payer: Self-pay | Admitting: Nurse Practitioner

## 2022-07-16 ENCOUNTER — Ambulatory Visit (INDEPENDENT_AMBULATORY_CARE_PROVIDER_SITE_OTHER): Payer: Medicare Other | Admitting: Nurse Practitioner

## 2022-07-16 DIAGNOSIS — E538 Deficiency of other specified B group vitamins: Secondary | ICD-10-CM | POA: Diagnosis not present

## 2022-07-16 DIAGNOSIS — D692 Other nonthrombocytopenic purpura: Secondary | ICD-10-CM

## 2022-07-16 DIAGNOSIS — I2510 Atherosclerotic heart disease of native coronary artery without angina pectoris: Secondary | ICD-10-CM | POA: Diagnosis not present

## 2022-07-16 DIAGNOSIS — I2583 Coronary atherosclerosis due to lipid rich plaque: Secondary | ICD-10-CM

## 2022-07-16 DIAGNOSIS — I890 Lymphedema, not elsewhere classified: Secondary | ICD-10-CM

## 2022-07-16 DIAGNOSIS — Z23 Encounter for immunization: Secondary | ICD-10-CM

## 2022-07-16 DIAGNOSIS — Z8673 Personal history of transient ischemic attack (TIA), and cerebral infarction without residual deficits: Secondary | ICD-10-CM

## 2022-07-16 DIAGNOSIS — G720 Drug-induced myopathy: Secondary | ICD-10-CM

## 2022-07-16 DIAGNOSIS — E785 Hyperlipidemia, unspecified: Secondary | ICD-10-CM | POA: Diagnosis not present

## 2022-07-16 DIAGNOSIS — I5032 Chronic diastolic (congestive) heart failure: Secondary | ICD-10-CM | POA: Diagnosis not present

## 2022-07-16 DIAGNOSIS — I700 Atherosclerosis of aorta: Secondary | ICD-10-CM

## 2022-07-16 DIAGNOSIS — E1169 Type 2 diabetes mellitus with other specified complication: Secondary | ICD-10-CM

## 2022-07-16 MED ORDER — CEFDINIR 300 MG PO CAPS: 300.0000 mg | ORAL_CAPSULE | Freq: Two times a day (BID) | ORAL | 0 refills | Status: AC

## 2022-07-16 NOTE — Assessment & Plan Note (Signed)
Chronic, stable.  Continue monthly injections.  Check level today.

## 2022-07-16 NOTE — Assessment & Plan Note (Signed)
Chronic, ongoing. Continue Zetia at this time as is tolerating.  Did not tolerate Rosuvastatin 20 MG three days a week or any other statin.  Have tried multiple statins with ongoing myalgias with use.  Praluent and Zetia also caused discomfort.  Check lipid panel and CMP today.  CCM collaboration continues. 

## 2022-07-16 NOTE — Assessment & Plan Note (Signed)
Joint pain with statins on various schedules, Zetia, and Praluent.  Could consider Repatha trial in future.  Continue Zetia at this time as is tolerating.  Lipid panel today.

## 2022-07-16 NOTE — Assessment & Plan Note (Signed)
BMI 35.04 with T2DM, HLD, COPD.  Recommended eating smaller high protein, low fat meals more frequently and exercising 30 mins a day 5 times a week with a goal of 10-15lb weight loss in the next 3 months. Patient voiced their understanding and motivation to adhere to these recommendations.

## 2022-07-16 NOTE — Assessment & Plan Note (Signed)
History of in 2019, most recent even 5/75/05 = atheroembolic causing CVA (cerebral vascular accident) of right MCA distribution.  At this time continue Zetia, has not tolerated statins in past, even on decreased pattern.  Check labs today: Lipid, CMP, CBC, TSH.  Discussed neuro goals: A1c <6.5% (current 6.4%), BP <130/80, and LDL <70. Referral to neurology placed.

## 2022-07-16 NOTE — Assessment & Plan Note (Signed)
Chronic, stable, remains off medication at this time -- did not tolerate Metformin in past.  Could consider trial of Farxiga in future.  Recent A1c in hospital was 6.4%, improved.  At this time continue diet focus, but with history of CVA goal is to maintain A1c <6.5%.  Monitor sugars at home a few days a week and document for provider.

## 2022-07-16 NOTE — Assessment & Plan Note (Signed)
Chronic, stable with no current symptoms.  Continue current medication regimen and collaboration with cardiology.

## 2022-07-16 NOTE — Assessment & Plan Note (Signed)
Recent EF in hospital 55-60%. Euvolemic.  Continue collaboration with cardiology, would benefit from repeat echo in upcoming months.  Recommend: - Reminded to call for an overnight weight gain of >2 pounds or a weekly weight gain of >5 pounds - not adding salt to food and read food labels. Reviewed the importance of keeping daily sodium intake to '2000mg'$  daily. - Avoid Ibuprofen products

## 2022-07-16 NOTE — Assessment & Plan Note (Signed)
On daily Plavix.  Recommend cleansing skin with gentle cleanser and use of daily lotion.  Monitor for skin breakdown and address if present.

## 2022-07-16 NOTE — Assessment & Plan Note (Signed)
Chronic, ongoing.  Continue use of compression hose daily.  Continue collaboration with vascular and dermatology team.  Appreciate their input.  Concern for initial infection beginning, as did not have usual cream routine in hospital.  Will order [facility] which has worked well in past and monitor, recommend she immediately alert provider if any worsening.

## 2022-07-16 NOTE — Progress Notes (Signed)
BP 118/63   Pulse 87   Temp 98 F (36.7 C) (Oral)   Ht _0  (1.702 m)   Wt 223 lb 11.2 oz (101.5 kg)   LMP  (LMP Unknown)   SpO2 97%   BMI 35.04 kg/m    Subjective:    Patient ID: Sharon [name], female    DOB: May 10, 1946, 76 y.o.   MRN: [id number]  HPI: Sharon [name] is a 76 y.o. female  Chief Complaint  Patient presents with   Cerebrovascular Accident    Patient is here for a hospital follow up on 07/02/22. Patient says since the Stroke, her speech has gotten better, but she still has some slurred speech. Patient says she has been using a walker. Patient says she would like to discuss her therapies with provider at today's visit. Patient says she has been drooling a lot and getting strangled easily and having to use bibs.    Medication Management    Patient says she thinks her Zetia prescription may be giving her Diarrhea.    Flu Vaccine    Patient says want to discuss with provider if she should have Flu vaccination with her current health.    Transition of Care Hospital Follow up.  Was admitted to [facility] on 07/02/22 for CVA event, history of CVA in past (2019) -- is having some slurring in speech post CVA.  Was to have OT and PT at home, but reports is not having success with current company -- went for awhile without hearing from anyone.  Did see speech therapy this week, they told her OT not coming yet and they will call her -- but have not called.  Current company is [employer], but they have not been communicating well with her.  Has not been getting consistent therapy.  Recent A1c 6.4% in hospital, trending down.  She reports while in hospital they did not place her cream on both legs and now front of legs are getting red again, she is concerned for infection -- has had in past and treated with Omnicef.  They started her on Zetia in hospital due to strokes and intolerance to statins, she wonders if recent diarrhea is caused by this but is unsure.  Endorses she is having increased  saliva with post CVA.  "Hospital Course: 76 y.o. female with medical history significant for anxiety, history of CVA with residual left-sided weakness and COPD who presented to the ER on 9/20 with slurred speech and weakness of her right upper extremity.  CT scan of the head suggestive of an acute infarct.  Her symptoms have improved since her initial presentation, but still with some persistent weakness.  Patient was seen in the ED about a week ago for left-sided numbness and had extensive work-up, negative at that time for acute CVA.  Admitted for further workup.   Assessment and Plan: * Atheroembolic causing CVA (cerebral vascular accident) ([place]) of right MCA distribution Patient presents for evaluation of slurred speech and right arm weakness Both symptoms have improved, although weakness still present.  Patient found to have what looks to be an evolving acute right MCA distribution infarct.  Recently seen for left-sided numbness last week which resolved and was discharged home to follow-up with neurology as an outpatient.   CT angiogram of head and neck noting severe right MCA branch stenosis, and stroke felt to be atheroembolic from this location.  Patient already on Plavix due to intolerance of aspirin from bruising.  Discussed with patient  and she is willing to try 81 mg of aspirin x90 days in addition to continuing Plavix indefinitely.  Echocardiogram unrevealing.   Not on statins due to allergy, will try Zetia.  On Plavix 2019 due to episodes of bruising with aspirin.  MRI of the brain noted confirming above CT findings.  A1c at 6.0.  Patient does not smoke.  Has always had stable blood pressure.  Patient found to still have some residual dysphagia although cleared to take p.o. so she will be followed by speech therapy at home health.  Also seen by PT and OT who are recommending home health.   Chronic diastolic CHF (congestive heart failure) (HCC) Incidentally noted on echocardiogram.   Euvolemic.   Hyperlipidemia A1c at 110 with target below 70.  Not able to tolerate statins and has tried different kinds.  We will try Zetia.   Prediabetes A1c at 6.4 on admission   Anxiety Stable Continue Celexa   Centrilobular emphysema (HCC) Stable and not acutely exacerbated Continue as needed bronchodilator therapy   Morbid obesity ([place]) Counseled about weight loss.  Patient meets criteria with BMI greater than 35 and comorbidities of CVA"   Hospital/Facility: [facility] D/C Physician: Dr. Laurel Dimmer D/C Date: 07/04/22  Records Requested: 07/16/22 Records Received: 07/16/22 Records Reviewed: 07/16/22  Diagnoses on Discharge: Atheroembolic causing CVA (cerebral vascular accident) ([place]) of right MCA distribution  Date of interactive Contact within 48 hours of discharge:  Contact was through: phone  Date of 7 day or 14 day face-to-face visit:    within 14 days  Outpatient Encounter Medications as of 07/16/2022  Medication Sig   acetaminophen (TYLENOL) 500 MG tablet Take 500 mg by mouth every 6 (six) hours as needed.   albuterol (PROAIR HFA) 108 (90 Base) MCG/ACT inhaler Inhale 2 puffs into the lungs every 6 (six) hours as needed for wheezing or shortness of breath.   aspirin EC 81 MG tablet Take 1 tablet (81 mg total) by mouth daily. Swallow whole.   Blood Glucose Monitoring Suppl (ONETOUCH VERIO) w/Device KIT Utilize to check blood sugar twice a day, fasting in morning with goal < 130 and then 2 hours after a meal with goal <180.  Document and bring to visits.   Budeson-Glycopyrrol-Formoterol (BREZTRI AEROSPHERE) 160-9-4.8 MCG/ACT AERO Inhale 2 puffs into the lungs 2 (two) times daily.   cefdinir (OMNICEF) 300 MG capsule Take 1 capsule (300 mg total) by mouth 2 (two) times daily for 7 days.   Cholecalciferol 1.25 MG (50000 UT) TABS Take 1 tablet by mouth once a week.   citalopram (CELEXA) 20 MG tablet Take 1 tablet (20 mg total) by mouth as needed.   clopidogrel (PLAVIX) 75 MG tablet  TAKE 1 TABLET(75 MG) BY MOUTH DAILY   Crisaborole (EUCRISA) 2 % OINT Apply 1 application. topically daily. Use for itching and inflammation at left leg   ezetimibe (ZETIA) 10 MG tablet Take 1 tablet (10 mg total) by mouth daily.   glucose blood (ONETOUCH VERIO) test strip UTILIIZE TO CHECK BLOOD SUGAR TWICE DAILY FASTING IN MORNING WITH GOAL<130 AND THAN 2 HOURS AFTER A MEAL WITH GGOAL<180   Lancets (ONETOUCH ULTRASOFT) lancets Utilize to check blood sugar twice a day, fasting in morning with goal < 130 and then 2 hours after a meal with goal <180.  Document and bring to visits.   loratadine (CLARITIN) 10 MG tablet Take 1 tablet (10 mg total) by mouth daily.   pantoprazole (PROTONIX) 40 MG tablet TAKE 1 TABLET(40 MG) BY MOUTH  DAILY   phenazopyridine (PYRIDIUM) 100 MG tablet Take 1 tablet (100 mg total) by mouth 3 (three) times daily as needed for pain.   Facility-Administered Encounter Medications as of 07/16/2022  Medication   cyanocobalamin ((VITAMIN B-12)) injection 1,000 mcg   Diagnostic Tests Reviewed/Disposition: Reviewed under labs and imaging in chart  Consults: neurology  Discharge Instructions: Follow-up with PCP and neurology  Disease/illness Education: Reviewed with patient at visit today.  Home Health/Community Services Discussions/Referrals: as above  Establishment or re-establishment of referral orders for community resources: Reviewed with patient at visit today.  Discussion with other health care providers:  Assessment and Support of treatment regimen adherence: Reviewed with patient at visit today.  Appointments Coordinated with: Reviewed with patient at visit today.  Education for self-management, independent living, and ADLs: Reviewed with patient at visit today.   Relevant past medical, surgical, family and social history reviewed and updated as indicated. Interim medical history since our last visit reviewed. Allergies and medications reviewed and  updated.  Review of Systems  Constitutional:  Negative for activity change, appetite change, diaphoresis, fatigue and fever.  Respiratory:  Negative for cough, chest tightness, shortness of breath and wheezing.   Cardiovascular:  Positive for leg swelling. Negative for chest pain and palpitations.  Gastrointestinal: Negative.   Endocrine: Negative for polydipsia, polyphagia and polyuria.  Neurological:  Positive for speech difficulty and weakness. Negative for dizziness, tremors, facial asymmetry, light-headedness, numbness and headaches.  Psychiatric/Behavioral: Negative.      Per HPI unless specifically indicated above     Objective:    BP 118/63   Pulse 87   Temp 98 F (36.7 C) (Oral)   Ht _0  (1.702 m)   Wt 223 lb 11.2 oz (101.5 kg)   LMP  (LMP Unknown)   SpO2 97%   BMI 35.04 kg/m   Wt Readings from Last 3 Encounters:  07/16/22 223 lb 11.2 oz (101.5 kg)  07/02/22 225 lb 1.4 oz (102.1 kg)  06/27/22 225 lb 4.8 oz (102.2 kg)    Physical Exam Vitals and nursing note reviewed.  Constitutional:      General: She is awake. She is not in acute distress.    Appearance: She is well-developed and well-groomed. She is obese. She is not ill-appearing or toxic-appearing.  HENT:     Head: Normocephalic.     Right Ear: Hearing normal.     Left Ear: Hearing normal.  Eyes:     General: Lids are normal.        Right eye: No discharge.        Left eye: No discharge.     Conjunctiva/sclera: Conjunctivae normal.  Cardiovascular:     Rate and Rhythm: Normal rate and regular rhythm.     Pulses:          Dorsalis pedis pulses are 2+ on the right side and 2+ on the left side.       Posterior tibial pulses are 2+ on the right side and 2+ on the left side.     Heart sounds: No murmur heard.    No gallop.  Pulmonary:     Effort: Pulmonary effort is normal. No accessory muscle usage or respiratory distress.     Breath sounds: Normal breath sounds.  Musculoskeletal:     Cervical back:  Normal range of motion.     Right lower leg: 2+ Edema present.     Left lower leg: 2+ Edema present.  Skin:    Findings: Bruising (mild  noted to upper extremities.) present.     Comments: Mild erythema to bilateral shins extending from ankle to just below knee L>R leg.  Baseline edema present.  Mild warmth.  Neurological:     Mental Status: She is alert and oriented to person, place, and time.     Cranial Nerves: Cranial nerves 2-12 are intact.     Motor: Weakness (mild upper extremities) present.     Coordination: Coordination is intact.     Gait: Gait is intact.     Deep Tendon Reflexes: Reflexes are normal and symmetric.     Reflex Scores:      Brachioradialis reflexes are 2+ on the right side and 2+ on the left side.      Patellar reflexes are 2+ on the right side and 2+ on the left side.    Comments: Occasional slurred speech noted.  Psychiatric:        Attention and Perception: Attention normal.        Mood and Affect: Mood normal.        Behavior: Behavior normal. Behavior is cooperative.        Thought Content: Thought content normal.        Judgment: Judgment normal.    Results for orders placed or performed during the hospital encounter of 07/02/22  Protime-INR  Result Value Ref Range   Prothrombin Time 13.1 11.4 - 15.2 seconds   INR 1.0 0.8 - 1.2  APTT  Result Value Ref Range   aPTT 26 24 - 36 seconds  CBC  Result Value Ref Range   WBC 10.0 4.0 - 10.5 K/uL   RBC 4.94 3.87 - 5.11 MIL/uL   Hemoglobin 14.8 12.0 - 15.0 g/dL   HCT 45.1 36.0 - 46.0 %   MCV 91.3 80.0 - 100.0 fL   MCH 30.0 26.0 - 34.0 pg   MCHC 32.8 30.0 - 36.0 g/dL   RDW 13.1 11.5 - 15.5 %   Platelets 233 150 - 400 K/uL   nRBC 0.0 0.0 - 0.2 %  Differential  Result Value Ref Range   Neutrophils Relative % 75 %   Neutro Abs 7.4 1.7 - 7.7 K/uL   Lymphocytes Relative 15 %   Lymphs Abs 1.5 0.7 - 4.0 K/uL   Monocytes Relative 9 %   Monocytes Absolute 0.9 0.1 - 1.0 K/uL   Eosinophils Relative 1 %    Eosinophils Absolute 0.1 0.0 - 0.5 K/uL   Basophils Relative 0 %   Basophils Absolute 0.0 0.0 - 0.1 K/uL   Immature Granulocytes 0 %   Abs Immature Granulocytes 0.04 0.00 - 0.07 K/uL  Comprehensive metabolic panel  Result Value Ref Range   Sodium 139 135 - 145 mmol/L   Potassium 4.2 3.5 - 5.1 mmol/L   Chloride 106 98 - 111 mmol/L   CO2 24 22 - 32 mmol/L   Glucose, Bld 146 (H) 70 - 99 mg/dL   BUN 23 8 - 23 mg/dL   Creatinine, Ser 1.15 (H) 0.44 - 1.00 mg/dL   Calcium 9.3 8.9 - 10.3 mg/dL   Total Protein 7.4 6.5 - 8.1 g/dL   Albumin 4.1 3.5 - 5.0 g/dL   AST 20 15 - 41 U/L   ALT 17 0 - 44 U/L   Alkaline Phosphatase 75 38 - 126 U/L   Total Bilirubin 0.7 0.3 - 1.2 mg/dL   GFR, Estimated 49 (L) >60 mL/min   Anion gap 9 5 - 15  Ethanol  Result Value  Ref Range   Alcohol, Ethyl (B) <10 <10 mg/dL  Lipid panel  Result Value Ref Range   Cholesterol 171 0 - 200 mg/dL   Triglycerides 72 <150 mg/dL   HDL 47 >40 mg/dL   Total CHOL/HDL Ratio 3.6 RATIO   VLDL 14 0 - 40 mg/dL   LDL Cholesterol 110 (H) 0 - 99 mg/dL  Hemoglobin A1c  Result Value Ref Range   Hgb A1c MFr Bld 6.4 (H) 4.8 - 5.6 %   Mean Plasma Glucose 136.98 mg/dL  Glucose, capillary  Result Value Ref Range   Glucose-Capillary 97 70 - 99 mg/dL  Basic metabolic panel  Result Value Ref Range   Sodium 141 135 - 145 mmol/L   Potassium 3.8 3.5 - 5.1 mmol/L   Chloride 108 98 - 111 mmol/L   CO2 25 22 - 32 mmol/L   Glucose, Bld 120 (H) 70 - 99 mg/dL   BUN 15 8 - 23 mg/dL   Creatinine, Ser 0.82 0.44 - 1.00 mg/dL   Calcium 9.0 8.9 - 10.3 mg/dL   GFR, Estimated >60 >60 mL/min   Anion gap 8 5 - 15  Glucose, capillary  Result Value Ref Range   Glucose-Capillary 148 (H) 70 - 99 mg/dL  Glucose, capillary  Result Value Ref Range   Glucose-Capillary 147 (H) 70 - 99 mg/dL  Glucose, capillary  Result Value Ref Range   Glucose-Capillary 129 (H) 70 - 99 mg/dL  Glucose, capillary  Result Value Ref Range   Glucose-Capillary 127 (H) 70  - 99 mg/dL  CBG monitoring, ED  Result Value Ref Range   Glucose-Capillary 142 (H) 70 - 99 mg/dL  CBG monitoring, ED  Result Value Ref Range   Glucose-Capillary 122 (H) 70 - 99 mg/dL  CBG monitoring, ED  Result Value Ref Range   Glucose-Capillary 147 (H) 70 - 99 mg/dL  ECHOCARDIOGRAM COMPLETE  Result Value Ref Range   Weight 3,601.43 oz   Height 67 in   BP 121/88 mmHg   Ao pk vel 1.02 m/s   AV Area VTI 1.95 cm2   AR max vel 2.24 cm2   AV Mean grad 2.0 mmHg   AV Peak grad 4.2 mmHg   S' Lateral 3.70 cm   AV Area mean vel 2.10 cm2   Area-P 1/2 3.74 cm2   *Note: Due to a large number of results and/or encounters for the requested time period, some results have not been displayed. A complete set of results can be found in Results Review.      Assessment & Plan:   Problem List Items Addressed This Visit       Cardiovascular and Mediastinum   Aortic atherosclerosis ([place])   Relevant Orders   Comprehensive metabolic panel   Lipid Panel w/o Chol/HDL Ratio   CAD (coronary artery disease)    Chronic, stable with no current symptoms.  Continue current medication regimen and collaboration with cardiology.      Chronic diastolic CHF (congestive heart failure) (HCC)    Recent EF in hospital 55-60%. Euvolemic.  Continue collaboration with cardiology, would benefit from repeat echo in upcoming months.  Recommend: - Reminded to call for an overnight weight gain of >2 pounds or a weekly weight gain of >5 pounds - not adding salt to food and read food labels. Reviewed the importance of keeping daily sodium intake to '2000mg'$  daily. - Avoid Ibuprofen products      Relevant Orders   CBC with Differential/Platelet   Comprehensive metabolic panel  TSH   Senile purpura (HCC)    On daily Plavix.  Recommend cleansing skin with gentle cleanser and use of daily lotion.  Monitor for skin breakdown and address if present.        Endocrine   Hyperlipidemia associated with type 2 diabetes  mellitus (HCC)    Chronic, ongoing. Continue Zetia at this time as is tolerating.  Did not tolerate Rosuvastatin 20 MG three days a week or any other statin.  Have tried multiple statins with ongoing myalgias with use.  Praluent and Zetia also caused discomfort.  Check lipid panel and CMP today.  CCM collaboration continues.      Type 2 diabetes mellitus with morbid obesity ([place]) - Primary    Chronic, stable, remains off medication at this time -- did not tolerate Metformin in past.  Could consider trial of Farxiga in future.  Recent A1c in hospital was 6.4%, improved.  At this time continue diet focus, but with history of CVA goal is to maintain A1c <6.5%.  Monitor sugars at home a few days a week and document for provider.        Musculoskeletal and Integument   Drug-induced myopathy    Joint pain with statins on various schedules, Zetia, and Praluent.  Could consider Repatha trial in future.  Continue Zetia at this time as is tolerating.  Lipid panel today.        Other   Morbid obesity (HCC) (Chronic)    BMI 35.04 with T2DM, HLD, COPD.  Recommended eating smaller high protein, low fat meals more frequently and exercising 30 mins a day 5 times a week with a goal of 10-15lb weight loss in the next 3 months. Patient voiced their understanding and motivation to adhere to these recommendations.       History of CVA (cerebrovascular accident)    History of in 2019, most recent even 5/64/33 = atheroembolic causing CVA (cerebral vascular accident) of right MCA distribution.  At this time continue Zetia, has not tolerated statins in past, even on decreased pattern.  Check labs today: Lipid, CMP, CBC, TSH.  Discussed neuro goals: A1c <6.5% (current 6.4%), BP <130/80, and LDL <70. Referral to neurology placed.      Relevant Orders   Ambulatory referral to Neurology   Lymphedema    Chronic, ongoing.  Continue use of compression hose daily.  Continue collaboration with vascular and dermatology  team.  Appreciate their input.  Concern for initial infection beginning, as did not have usual cream routine in hospital.  Will order [facility] which has worked well in past and monitor, recommend she immediately alert provider if any worsening.      Relevant Orders   Lipid Panel w/o Chol/HDL Ratio   Vitamin B12 deficiency    Chronic, stable.  Continue monthly injections.  Check level today.      Relevant Orders   Vitamin B12   Other Visit Diagnoses     Flu vaccine need       Wishes to hold off for two weeks until comes in for B12 shot.        Follow up plan: Return in about 4 weeks (around 08/13/2022) for HLD, POST CVA.

## 2022-07-17 DIAGNOSIS — Z7902 Long term (current) use of antithrombotics/antiplatelets: Secondary | ICD-10-CM | POA: Diagnosis not present

## 2022-07-17 DIAGNOSIS — J432 Centrilobular emphysema: Secondary | ICD-10-CM | POA: Diagnosis not present

## 2022-07-17 DIAGNOSIS — Z9181 History of falling: Secondary | ICD-10-CM | POA: Diagnosis not present

## 2022-07-17 DIAGNOSIS — I69331 Monoplegia of upper limb following cerebral infarction affecting right dominant side: Secondary | ICD-10-CM | POA: Diagnosis not present

## 2022-07-17 DIAGNOSIS — I69328 Other speech and language deficits following cerebral infarction: Secondary | ICD-10-CM | POA: Diagnosis not present

## 2022-07-17 DIAGNOSIS — I69354 Hemiplegia and hemiparesis following cerebral infarction affecting left non-dominant side: Secondary | ICD-10-CM | POA: Diagnosis not present

## 2022-07-17 DIAGNOSIS — R7303 Prediabetes: Secondary | ICD-10-CM | POA: Diagnosis not present

## 2022-07-17 DIAGNOSIS — Z7982 Long term (current) use of aspirin: Secondary | ICD-10-CM | POA: Diagnosis not present

## 2022-07-17 DIAGNOSIS — I5032 Chronic diastolic (congestive) heart failure: Secondary | ICD-10-CM | POA: Diagnosis not present

## 2022-07-17 DIAGNOSIS — E785 Hyperlipidemia, unspecified: Secondary | ICD-10-CM | POA: Diagnosis not present

## 2022-07-17 LAB — COMPREHENSIVE METABOLIC PANEL
ALT: 17 IU/L (ref 0–32)
AST: 18 IU/L (ref 0–40)
Albumin/Globulin Ratio: 1.9 (ref 1.2–2.2)
Albumin: 4.3 g/dL (ref 3.8–4.8)
Alkaline Phosphatase: 91 IU/L (ref 44–121)
BUN/Creatinine Ratio: 18 (ref 12–28)
BUN: 20 mg/dL (ref 8–27)
Bilirubin Total: 0.3 mg/dL (ref 0.0–1.2)
CO2: 19 mmol/L — ABNORMAL LOW (ref 20–29)
Calcium: 9.2 mg/dL (ref 8.7–10.3)
Chloride: 103 mmol/L (ref 96–106)
Creatinine, Ser: 1.12 mg/dL — ABNORMAL HIGH (ref 0.57–1.00)
Globulin, Total: 2.3 g/dL (ref 1.5–4.5)
Glucose: 120 mg/dL — ABNORMAL HIGH (ref 70–99)
Potassium: 4.3 mmol/L (ref 3.5–5.2)
Sodium: 139 mmol/L (ref 134–144)
Total Protein: 6.6 g/dL (ref 6.0–8.5)
eGFR: 51 mL/min/{1.73_m2} — ABNORMAL LOW (ref >59)

## 2022-07-17 LAB — CBC WITH DIFFERENTIAL/PLATELET
Basophils Absolute: 0 10*3/uL (ref 0.0–0.2)
Basos: 0 %
EOS (ABSOLUTE): 0.1 10*3/uL (ref 0.0–0.4)
Eos: 1 %
Hematocrit: 42 % (ref 34.0–46.6)
Hemoglobin: 14.3 g/dL (ref 11.1–15.9)
Immature Grans (Abs): 0 10*3/uL (ref 0.0–0.1)
Immature Granulocytes: 0 %
Lymphocytes Absolute: 1.2 10*3/uL (ref 0.7–3.1)
Lymphs: 16 %
MCH: 30.2 pg (ref 26.6–33.0)
MCHC: 34 g/dL (ref 31.5–35.7)
MCV: 89 fL (ref 79–97)
Monocytes Absolute: 0.6 10*3/uL (ref 0.1–0.9)
Monocytes: 8 %
Neutrophils Absolute: 5.8 10*3/uL (ref 1.4–7.0)
Neutrophils: 75 %
Platelets: 234 10*3/uL (ref 150–450)
RBC: 4.73 x10E6/uL (ref 3.77–5.28)
RDW: 12.7 % (ref 11.7–15.4)
WBC: 7.7 10*3/uL (ref 3.4–10.8)

## 2022-07-17 LAB — VITAMIN B12: Vitamin B-12: 840 pg/mL (ref 232–1245)

## 2022-07-17 LAB — LIPID PANEL W/O CHOL/HDL RATIO
Cholesterol, Total: 175 mg/dL (ref 100–199)
HDL: 63 mg/dL (ref >39)
LDL Chol Calc (NIH): 91 mg/dL (ref 0–99)
Triglycerides: 116 mg/dL (ref 0–149)
VLDL Cholesterol Cal: 21 mg/dL (ref 5–40)

## 2022-07-17 LAB — TSH: TSH: 1.84 u[IU]/mL (ref 0.450–4.500)

## 2022-07-17 NOTE — Progress Notes (Signed)
Contacted via MyChart   Good evening Sharon [name], your labs have returned: - Kidney function, creatinine and eGFR, shows mild kidney disease stage 3a -- stable from previous labs with no worsening.  Good news.  Liver function, AST and ALT, is normal. - CBC shows no infection or anemia. - Cholesterol labs are improving with Zetia on board.  Would like to see LDL get less then 70 -- I recommend continue this medication at this time. - Thyroid lab (TSH) and B12 are normal ranges.  Any questions? Keep being amazing!!  Thank you for allowing me to participate in your care.  I appreciate you. Kindest regards, Dominik Lauricella

## 2022-07-18 ENCOUNTER — Telehealth: Payer: Self-pay

## 2022-07-18 ENCOUNTER — Telehealth: Payer: Self-pay | Admitting: Nurse Practitioner

## 2022-07-18 DIAGNOSIS — Z8673 Personal history of transient ischemic attack (TIA), and cerebral infarction without residual deficits: Secondary | ICD-10-CM

## 2022-07-18 DIAGNOSIS — I69328 Other speech and language deficits following cerebral infarction: Secondary | ICD-10-CM

## 2022-07-18 NOTE — Telephone Encounter (Signed)
-----   Message from [name], NP sent at 07/16/2022  3:21 PM EDT ----- Can you call [facility] at some point as she is not getting good communication with them -- has had only one PT visit and one speech visit, should be having OT and PT at least twice a week + speech twice 2 times a week due to slurring of speech and increased saliva post CVA.  She has tried contacting with no success -- if they can not provide care please tell them to alert [place] ASAP and we will place referal for alternate options.  Their number is [phone]

## 2022-07-18 NOTE — Telephone Encounter (Signed)
Spoke with Nurse from [facility] and was made aware that since patient was seen in office she has had another visit with PT and Speech Therapy. Nurse informed me that there was no order for OT. I asked if they could place the orders as patient was set to have them after discharge. Nurse says she would have them placed and sent to provider.

## 2022-07-18 NOTE — Telephone Encounter (Signed)
Pt called asking if Sharon [name] could call her back because she needs someone to come to her home and do speech therapy.  She does not want to go out.  CB  [phone].

## 2022-07-18 NOTE — Telephone Encounter (Signed)
Copied from [place]. Topic: General - Other >> Jul 18, 2022  1:23 PM Ja-Kwan M wrote: Reason for CRM: Soni a PT with [facility] reports that they no longer have a speech therapist so if the provider would like pt to have this service it would have to be referred out. Soni requests call back. Cb# [phone]

## 2022-07-21 ENCOUNTER — Telehealth: Payer: Self-pay | Admitting: Nurse Practitioner

## 2022-07-21 DIAGNOSIS — R7303 Prediabetes: Secondary | ICD-10-CM | POA: Diagnosis not present

## 2022-07-21 DIAGNOSIS — J432 Centrilobular emphysema: Secondary | ICD-10-CM | POA: Diagnosis not present

## 2022-07-21 DIAGNOSIS — I69354 Hemiplegia and hemiparesis following cerebral infarction affecting left non-dominant side: Secondary | ICD-10-CM | POA: Diagnosis not present

## 2022-07-21 DIAGNOSIS — Z9181 History of falling: Secondary | ICD-10-CM | POA: Diagnosis not present

## 2022-07-21 DIAGNOSIS — E785 Hyperlipidemia, unspecified: Secondary | ICD-10-CM | POA: Diagnosis not present

## 2022-07-21 DIAGNOSIS — Z7982 Long term (current) use of aspirin: Secondary | ICD-10-CM | POA: Diagnosis not present

## 2022-07-21 DIAGNOSIS — Z7902 Long term (current) use of antithrombotics/antiplatelets: Secondary | ICD-10-CM | POA: Diagnosis not present

## 2022-07-21 DIAGNOSIS — I69328 Other speech and language deficits following cerebral infarction: Secondary | ICD-10-CM | POA: Diagnosis not present

## 2022-07-21 DIAGNOSIS — I5032 Chronic diastolic (congestive) heart failure: Secondary | ICD-10-CM | POA: Diagnosis not present

## 2022-07-21 DIAGNOSIS — I69331 Monoplegia of upper limb following cerebral infarction affecting right dominant side: Secondary | ICD-10-CM | POA: Diagnosis not present

## 2022-07-21 NOTE — Telephone Encounter (Signed)
Home Health Verbal Orders - Caller/AgencyColletta Maryland [facility]  OT  Callback Number: [phone] Requesting OT/PT/Skilled Nursing/Social Work/Speech Therapy: OT  Frequency:   1w6

## 2022-07-22 DIAGNOSIS — Z7982 Long term (current) use of aspirin: Secondary | ICD-10-CM | POA: Diagnosis not present

## 2022-07-22 DIAGNOSIS — I69328 Other speech and language deficits following cerebral infarction: Secondary | ICD-10-CM | POA: Diagnosis not present

## 2022-07-22 DIAGNOSIS — Z7902 Long term (current) use of antithrombotics/antiplatelets: Secondary | ICD-10-CM | POA: Diagnosis not present

## 2022-07-22 DIAGNOSIS — I69331 Monoplegia of upper limb following cerebral infarction affecting right dominant side: Secondary | ICD-10-CM | POA: Diagnosis not present

## 2022-07-22 DIAGNOSIS — Z9181 History of falling: Secondary | ICD-10-CM | POA: Diagnosis not present

## 2022-07-22 DIAGNOSIS — I5032 Chronic diastolic (congestive) heart failure: Secondary | ICD-10-CM | POA: Diagnosis not present

## 2022-07-22 DIAGNOSIS — I69354 Hemiplegia and hemiparesis following cerebral infarction affecting left non-dominant side: Secondary | ICD-10-CM | POA: Diagnosis not present

## 2022-07-22 DIAGNOSIS — J432 Centrilobular emphysema: Secondary | ICD-10-CM | POA: Diagnosis not present

## 2022-07-22 DIAGNOSIS — R7303 Prediabetes: Secondary | ICD-10-CM | POA: Diagnosis not present

## 2022-07-22 DIAGNOSIS — E785 Hyperlipidemia, unspecified: Secondary | ICD-10-CM | POA: Diagnosis not present

## 2022-07-22 NOTE — Telephone Encounter (Signed)
Spoke with Colletta [place] and provided her with OK for verbal orders for the patient. Colletta [place] verbalized understanding and no further questions.

## 2022-07-24 DIAGNOSIS — I69331 Monoplegia of upper limb following cerebral infarction affecting right dominant side: Secondary | ICD-10-CM | POA: Diagnosis not present

## 2022-07-24 DIAGNOSIS — I69328 Other speech and language deficits following cerebral infarction: Secondary | ICD-10-CM | POA: Diagnosis not present

## 2022-07-24 DIAGNOSIS — Z9181 History of falling: Secondary | ICD-10-CM | POA: Diagnosis not present

## 2022-07-24 DIAGNOSIS — E785 Hyperlipidemia, unspecified: Secondary | ICD-10-CM | POA: Diagnosis not present

## 2022-07-24 DIAGNOSIS — Z7982 Long term (current) use of aspirin: Secondary | ICD-10-CM | POA: Diagnosis not present

## 2022-07-24 DIAGNOSIS — I5032 Chronic diastolic (congestive) heart failure: Secondary | ICD-10-CM | POA: Diagnosis not present

## 2022-07-24 DIAGNOSIS — I69354 Hemiplegia and hemiparesis following cerebral infarction affecting left non-dominant side: Secondary | ICD-10-CM | POA: Diagnosis not present

## 2022-07-24 DIAGNOSIS — J432 Centrilobular emphysema: Secondary | ICD-10-CM | POA: Diagnosis not present

## 2022-07-24 DIAGNOSIS — Z7902 Long term (current) use of antithrombotics/antiplatelets: Secondary | ICD-10-CM | POA: Diagnosis not present

## 2022-07-24 DIAGNOSIS — R7303 Prediabetes: Secondary | ICD-10-CM | POA: Diagnosis not present

## 2022-07-26 ENCOUNTER — Other Ambulatory Visit: Payer: Self-pay | Admitting: Nurse Practitioner

## 2022-07-28 ENCOUNTER — Ambulatory Visit (INDEPENDENT_AMBULATORY_CARE_PROVIDER_SITE_OTHER): Payer: Medicare Other

## 2022-07-28 DIAGNOSIS — E538 Deficiency of other specified B group vitamins: Secondary | ICD-10-CM | POA: Diagnosis not present

## 2022-07-28 DIAGNOSIS — Z23 Encounter for immunization: Secondary | ICD-10-CM | POA: Diagnosis not present

## 2022-07-28 MED ORDER — PANTOPRAZOLE SODIUM 40 MG PO TBEC: DELAYED_RELEASE_TABLET | ORAL | 2 refills | Status: DC

## 2022-07-28 NOTE — Telephone Encounter (Signed)
Refused Protonix 40 mg because being requested too early.

## 2022-07-28 NOTE — Telephone Encounter (Signed)
Requested Prescriptions  Pending Prescriptions Disp Refills  . pantoprazole (PROTONIX) 40 MG tablet 90 tablet 2    Sig: TAKE 1 TABLET(40 MG) BY MOUTH DAILY     Gastroenterology: Proton Pump Inhibitors Passed - 07/28/2022  2:40 PM      Passed - Valid encounter within last 12 months    Recent Outpatient Visits          1 week ago Type 2 diabetes mellitus with morbid obesity ([place])   [place], [place] T, NP   1 month ago Acute cystitis without hematuria   [place], Erin E, PA-C   1 month ago Tick bite of right lower leg, initial encounter   [employer], Erin E, PA-C   6 months ago Type 2 diabetes mellitus with obesity ([place])   [place], Jolene T, NP   7 months ago COPD exacerbation ([place])   [place], [name], NP      Future Appointments            In 3 weeks [name], [name], NP [employer], [facility]           Refused Prescriptions Disp Refills  . pantoprazole (PROTONIX) 40 MG tablet [Pharmacy Med Name: PANTOPRAZOLE '40MG'$  TABLETS] 90 tablet 4    Sig: TAKE 1 TABLET(40 MG) BY MOUTH DAILY     Gastroenterology: Proton Pump Inhibitors Passed - 07/28/2022  2:40 PM      Passed - Valid encounter within last 12 months    Recent Outpatient Visits          1 week ago Type 2 diabetes mellitus with morbid obesity ([place])   [place], Joy T, NP   1 month ago Acute cystitis without hematuria   [place], Erin E, PA-C   1 month ago Tick bite of right lower leg, initial encounter   [employer], Erin E, PA-C   6 months ago Type 2 diabetes mellitus with obesity ([place])   [place], Jolene T, NP   7 months ago COPD exacerbation ([place])   [place], [name], NP      Future Appointments            In 3 weeks [name], [name], NP [employer], [facility]

## 2022-07-28 NOTE — Telephone Encounter (Signed)
Patient called and stated that [facility] said she didn't have any more refills. Patient states that she changed pharmacies a few times this year and is not sure if that is causing the refill issue. Please advise.

## 2022-07-29 DIAGNOSIS — J432 Centrilobular emphysema: Secondary | ICD-10-CM | POA: Diagnosis not present

## 2022-07-29 DIAGNOSIS — I5032 Chronic diastolic (congestive) heart failure: Secondary | ICD-10-CM | POA: Diagnosis not present

## 2022-07-29 DIAGNOSIS — I69354 Hemiplegia and hemiparesis following cerebral infarction affecting left non-dominant side: Secondary | ICD-10-CM | POA: Diagnosis not present

## 2022-07-29 DIAGNOSIS — Z7902 Long term (current) use of antithrombotics/antiplatelets: Secondary | ICD-10-CM | POA: Diagnosis not present

## 2022-07-29 DIAGNOSIS — Z9181 History of falling: Secondary | ICD-10-CM | POA: Diagnosis not present

## 2022-07-29 DIAGNOSIS — E785 Hyperlipidemia, unspecified: Secondary | ICD-10-CM | POA: Diagnosis not present

## 2022-07-29 DIAGNOSIS — Z7982 Long term (current) use of aspirin: Secondary | ICD-10-CM | POA: Diagnosis not present

## 2022-07-29 DIAGNOSIS — I69331 Monoplegia of upper limb following cerebral infarction affecting right dominant side: Secondary | ICD-10-CM | POA: Diagnosis not present

## 2022-07-29 DIAGNOSIS — R7303 Prediabetes: Secondary | ICD-10-CM | POA: Diagnosis not present

## 2022-07-29 DIAGNOSIS — I69328 Other speech and language deficits following cerebral infarction: Secondary | ICD-10-CM | POA: Diagnosis not present

## 2022-07-30 DIAGNOSIS — Z7902 Long term (current) use of antithrombotics/antiplatelets: Secondary | ICD-10-CM | POA: Diagnosis not present

## 2022-07-30 DIAGNOSIS — J432 Centrilobular emphysema: Secondary | ICD-10-CM | POA: Diagnosis not present

## 2022-07-30 DIAGNOSIS — Z7982 Long term (current) use of aspirin: Secondary | ICD-10-CM | POA: Diagnosis not present

## 2022-07-30 DIAGNOSIS — I69328 Other speech and language deficits following cerebral infarction: Secondary | ICD-10-CM | POA: Diagnosis not present

## 2022-07-30 DIAGNOSIS — E785 Hyperlipidemia, unspecified: Secondary | ICD-10-CM | POA: Diagnosis not present

## 2022-07-30 DIAGNOSIS — I69354 Hemiplegia and hemiparesis following cerebral infarction affecting left non-dominant side: Secondary | ICD-10-CM | POA: Diagnosis not present

## 2022-07-30 DIAGNOSIS — Z9181 History of falling: Secondary | ICD-10-CM | POA: Diagnosis not present

## 2022-07-30 DIAGNOSIS — R7303 Prediabetes: Secondary | ICD-10-CM | POA: Diagnosis not present

## 2022-07-30 DIAGNOSIS — I69331 Monoplegia of upper limb following cerebral infarction affecting right dominant side: Secondary | ICD-10-CM | POA: Diagnosis not present

## 2022-07-30 DIAGNOSIS — I5032 Chronic diastolic (congestive) heart failure: Secondary | ICD-10-CM | POA: Diagnosis not present

## 2022-08-01 ENCOUNTER — Ambulatory Visit
Admission: RE | Admit: 2022-08-01 | Discharge: 2022-08-01 | Disposition: A | Discharge status: Home / Self Care | Payer: Medicare Other | Source: Ambulatory Visit | Attending: Acute Care | Admitting: Acute Care

## 2022-08-01 DIAGNOSIS — I69328 Other speech and language deficits following cerebral infarction: Secondary | ICD-10-CM | POA: Diagnosis not present

## 2022-08-01 DIAGNOSIS — I69331 Monoplegia of upper limb following cerebral infarction affecting right dominant side: Secondary | ICD-10-CM | POA: Diagnosis not present

## 2022-08-01 DIAGNOSIS — I5032 Chronic diastolic (congestive) heart failure: Secondary | ICD-10-CM | POA: Diagnosis not present

## 2022-08-01 DIAGNOSIS — Z87891 Personal history of nicotine dependence: Secondary | ICD-10-CM | POA: Insufficient documentation

## 2022-08-01 DIAGNOSIS — Z7902 Long term (current) use of antithrombotics/antiplatelets: Secondary | ICD-10-CM | POA: Diagnosis not present

## 2022-08-01 DIAGNOSIS — J432 Centrilobular emphysema: Secondary | ICD-10-CM | POA: Diagnosis not present

## 2022-08-01 DIAGNOSIS — R7303 Prediabetes: Secondary | ICD-10-CM | POA: Diagnosis not present

## 2022-08-01 DIAGNOSIS — Z9181 History of falling: Secondary | ICD-10-CM | POA: Diagnosis not present

## 2022-08-01 DIAGNOSIS — E785 Hyperlipidemia, unspecified: Secondary | ICD-10-CM | POA: Diagnosis not present

## 2022-08-01 DIAGNOSIS — Z7982 Long term (current) use of aspirin: Secondary | ICD-10-CM | POA: Diagnosis not present

## 2022-08-01 DIAGNOSIS — I69354 Hemiplegia and hemiparesis following cerebral infarction affecting left non-dominant side: Secondary | ICD-10-CM | POA: Diagnosis not present

## 2022-08-04 DIAGNOSIS — I5032 Chronic diastolic (congestive) heart failure: Secondary | ICD-10-CM | POA: Diagnosis not present

## 2022-08-04 DIAGNOSIS — J432 Centrilobular emphysema: Secondary | ICD-10-CM | POA: Diagnosis not present

## 2022-08-04 DIAGNOSIS — Z7902 Long term (current) use of antithrombotics/antiplatelets: Secondary | ICD-10-CM | POA: Diagnosis not present

## 2022-08-04 DIAGNOSIS — Z7982 Long term (current) use of aspirin: Secondary | ICD-10-CM | POA: Diagnosis not present

## 2022-08-04 DIAGNOSIS — R7303 Prediabetes: Secondary | ICD-10-CM | POA: Diagnosis not present

## 2022-08-04 DIAGNOSIS — E785 Hyperlipidemia, unspecified: Secondary | ICD-10-CM | POA: Diagnosis not present

## 2022-08-04 DIAGNOSIS — I69331 Monoplegia of upper limb following cerebral infarction affecting right dominant side: Secondary | ICD-10-CM | POA: Diagnosis not present

## 2022-08-04 DIAGNOSIS — I69354 Hemiplegia and hemiparesis following cerebral infarction affecting left non-dominant side: Secondary | ICD-10-CM | POA: Diagnosis not present

## 2022-08-04 DIAGNOSIS — Z9181 History of falling: Secondary | ICD-10-CM | POA: Diagnosis not present

## 2022-08-04 DIAGNOSIS — I69328 Other speech and language deficits following cerebral infarction: Secondary | ICD-10-CM | POA: Diagnosis not present

## 2022-08-05 ENCOUNTER — Other Ambulatory Visit: Payer: Self-pay | Admitting: Acute Care

## 2022-08-05 DIAGNOSIS — Z122 Encounter for screening for malignant neoplasm of respiratory organs: Secondary | ICD-10-CM

## 2022-08-05 DIAGNOSIS — Z87891 Personal history of nicotine dependence: Secondary | ICD-10-CM

## 2022-08-06 DIAGNOSIS — Z7982 Long term (current) use of aspirin: Secondary | ICD-10-CM | POA: Diagnosis not present

## 2022-08-06 DIAGNOSIS — J432 Centrilobular emphysema: Secondary | ICD-10-CM | POA: Diagnosis not present

## 2022-08-06 DIAGNOSIS — Z7902 Long term (current) use of antithrombotics/antiplatelets: Secondary | ICD-10-CM | POA: Diagnosis not present

## 2022-08-06 DIAGNOSIS — I69328 Other speech and language deficits following cerebral infarction: Secondary | ICD-10-CM | POA: Diagnosis not present

## 2022-08-06 DIAGNOSIS — I69331 Monoplegia of upper limb following cerebral infarction affecting right dominant side: Secondary | ICD-10-CM | POA: Diagnosis not present

## 2022-08-06 DIAGNOSIS — I69354 Hemiplegia and hemiparesis following cerebral infarction affecting left non-dominant side: Secondary | ICD-10-CM | POA: Diagnosis not present

## 2022-08-06 DIAGNOSIS — Z9181 History of falling: Secondary | ICD-10-CM | POA: Diagnosis not present

## 2022-08-06 DIAGNOSIS — I5032 Chronic diastolic (congestive) heart failure: Secondary | ICD-10-CM | POA: Diagnosis not present

## 2022-08-06 DIAGNOSIS — R7303 Prediabetes: Secondary | ICD-10-CM | POA: Diagnosis not present

## 2022-08-06 DIAGNOSIS — E785 Hyperlipidemia, unspecified: Secondary | ICD-10-CM | POA: Diagnosis not present

## 2022-08-07 DIAGNOSIS — J432 Centrilobular emphysema: Secondary | ICD-10-CM | POA: Diagnosis not present

## 2022-08-07 DIAGNOSIS — Z7902 Long term (current) use of antithrombotics/antiplatelets: Secondary | ICD-10-CM | POA: Diagnosis not present

## 2022-08-07 DIAGNOSIS — I69328 Other speech and language deficits following cerebral infarction: Secondary | ICD-10-CM | POA: Diagnosis not present

## 2022-08-07 DIAGNOSIS — Z9181 History of falling: Secondary | ICD-10-CM | POA: Diagnosis not present

## 2022-08-07 DIAGNOSIS — Z7982 Long term (current) use of aspirin: Secondary | ICD-10-CM | POA: Diagnosis not present

## 2022-08-07 DIAGNOSIS — E785 Hyperlipidemia, unspecified: Secondary | ICD-10-CM | POA: Diagnosis not present

## 2022-08-07 DIAGNOSIS — R7303 Prediabetes: Secondary | ICD-10-CM | POA: Diagnosis not present

## 2022-08-07 DIAGNOSIS — I69354 Hemiplegia and hemiparesis following cerebral infarction affecting left non-dominant side: Secondary | ICD-10-CM | POA: Diagnosis not present

## 2022-08-07 DIAGNOSIS — I5032 Chronic diastolic (congestive) heart failure: Secondary | ICD-10-CM | POA: Diagnosis not present

## 2022-08-07 DIAGNOSIS — I69331 Monoplegia of upper limb following cerebral infarction affecting right dominant side: Secondary | ICD-10-CM | POA: Diagnosis not present

## 2022-08-11 ENCOUNTER — Encounter (INDEPENDENT_AMBULATORY_CARE_PROVIDER_SITE_OTHER): Payer: Self-pay

## 2022-08-13 DIAGNOSIS — I5032 Chronic diastolic (congestive) heart failure: Secondary | ICD-10-CM | POA: Diagnosis not present

## 2022-08-13 DIAGNOSIS — I69331 Monoplegia of upper limb following cerebral infarction affecting right dominant side: Secondary | ICD-10-CM | POA: Diagnosis not present

## 2022-08-13 DIAGNOSIS — Z7982 Long term (current) use of aspirin: Secondary | ICD-10-CM | POA: Diagnosis not present

## 2022-08-13 DIAGNOSIS — J432 Centrilobular emphysema: Secondary | ICD-10-CM | POA: Diagnosis not present

## 2022-08-13 DIAGNOSIS — I69354 Hemiplegia and hemiparesis following cerebral infarction affecting left non-dominant side: Secondary | ICD-10-CM | POA: Diagnosis not present

## 2022-08-13 DIAGNOSIS — Z7902 Long term (current) use of antithrombotics/antiplatelets: Secondary | ICD-10-CM | POA: Diagnosis not present

## 2022-08-13 DIAGNOSIS — R7303 Prediabetes: Secondary | ICD-10-CM | POA: Diagnosis not present

## 2022-08-13 DIAGNOSIS — Z9181 History of falling: Secondary | ICD-10-CM | POA: Diagnosis not present

## 2022-08-13 DIAGNOSIS — I69328 Other speech and language deficits following cerebral infarction: Secondary | ICD-10-CM | POA: Diagnosis not present

## 2022-08-13 DIAGNOSIS — E785 Hyperlipidemia, unspecified: Secondary | ICD-10-CM | POA: Diagnosis not present

## 2022-08-14 DIAGNOSIS — Z7902 Long term (current) use of antithrombotics/antiplatelets: Secondary | ICD-10-CM | POA: Diagnosis not present

## 2022-08-14 DIAGNOSIS — Z7982 Long term (current) use of aspirin: Secondary | ICD-10-CM | POA: Diagnosis not present

## 2022-08-14 DIAGNOSIS — I69354 Hemiplegia and hemiparesis following cerebral infarction affecting left non-dominant side: Secondary | ICD-10-CM | POA: Diagnosis not present

## 2022-08-14 DIAGNOSIS — J432 Centrilobular emphysema: Secondary | ICD-10-CM | POA: Diagnosis not present

## 2022-08-14 DIAGNOSIS — I5032 Chronic diastolic (congestive) heart failure: Secondary | ICD-10-CM | POA: Diagnosis not present

## 2022-08-14 DIAGNOSIS — I69328 Other speech and language deficits following cerebral infarction: Secondary | ICD-10-CM | POA: Diagnosis not present

## 2022-08-14 DIAGNOSIS — E785 Hyperlipidemia, unspecified: Secondary | ICD-10-CM | POA: Diagnosis not present

## 2022-08-14 DIAGNOSIS — Z9181 History of falling: Secondary | ICD-10-CM | POA: Diagnosis not present

## 2022-08-14 DIAGNOSIS — I69331 Monoplegia of upper limb following cerebral infarction affecting right dominant side: Secondary | ICD-10-CM | POA: Diagnosis not present

## 2022-08-14 DIAGNOSIS — R7303 Prediabetes: Secondary | ICD-10-CM | POA: Diagnosis not present

## 2022-08-15 DIAGNOSIS — I69331 Monoplegia of upper limb following cerebral infarction affecting right dominant side: Secondary | ICD-10-CM | POA: Diagnosis not present

## 2022-08-15 DIAGNOSIS — Z7982 Long term (current) use of aspirin: Secondary | ICD-10-CM | POA: Diagnosis not present

## 2022-08-15 DIAGNOSIS — E785 Hyperlipidemia, unspecified: Secondary | ICD-10-CM | POA: Diagnosis not present

## 2022-08-15 DIAGNOSIS — R7303 Prediabetes: Secondary | ICD-10-CM | POA: Diagnosis not present

## 2022-08-15 DIAGNOSIS — I5032 Chronic diastolic (congestive) heart failure: Secondary | ICD-10-CM | POA: Diagnosis not present

## 2022-08-15 DIAGNOSIS — J432 Centrilobular emphysema: Secondary | ICD-10-CM | POA: Diagnosis not present

## 2022-08-15 DIAGNOSIS — Z7902 Long term (current) use of antithrombotics/antiplatelets: Secondary | ICD-10-CM | POA: Diagnosis not present

## 2022-08-15 DIAGNOSIS — Z9181 History of falling: Secondary | ICD-10-CM | POA: Diagnosis not present

## 2022-08-15 DIAGNOSIS — I69328 Other speech and language deficits following cerebral infarction: Secondary | ICD-10-CM | POA: Diagnosis not present

## 2022-08-15 DIAGNOSIS — I69354 Hemiplegia and hemiparesis following cerebral infarction affecting left non-dominant side: Secondary | ICD-10-CM | POA: Diagnosis not present

## 2022-08-19 ENCOUNTER — Ambulatory Visit: Payer: Medicare Other | Admitting: Nurse Practitioner

## 2022-08-20 DIAGNOSIS — I69331 Monoplegia of upper limb following cerebral infarction affecting right dominant side: Secondary | ICD-10-CM | POA: Diagnosis not present

## 2022-08-20 DIAGNOSIS — Z7982 Long term (current) use of aspirin: Secondary | ICD-10-CM | POA: Diagnosis not present

## 2022-08-20 DIAGNOSIS — R7303 Prediabetes: Secondary | ICD-10-CM | POA: Diagnosis not present

## 2022-08-20 DIAGNOSIS — E785 Hyperlipidemia, unspecified: Secondary | ICD-10-CM | POA: Diagnosis not present

## 2022-08-20 DIAGNOSIS — I69354 Hemiplegia and hemiparesis following cerebral infarction affecting left non-dominant side: Secondary | ICD-10-CM | POA: Diagnosis not present

## 2022-08-20 DIAGNOSIS — J432 Centrilobular emphysema: Secondary | ICD-10-CM | POA: Diagnosis not present

## 2022-08-20 DIAGNOSIS — Z9181 History of falling: Secondary | ICD-10-CM | POA: Diagnosis not present

## 2022-08-20 DIAGNOSIS — Z7902 Long term (current) use of antithrombotics/antiplatelets: Secondary | ICD-10-CM | POA: Diagnosis not present

## 2022-08-20 DIAGNOSIS — I69328 Other speech and language deficits following cerebral infarction: Secondary | ICD-10-CM | POA: Diagnosis not present

## 2022-08-20 DIAGNOSIS — I5032 Chronic diastolic (congestive) heart failure: Secondary | ICD-10-CM | POA: Diagnosis not present

## 2022-08-22 DIAGNOSIS — I5032 Chronic diastolic (congestive) heart failure: Secondary | ICD-10-CM | POA: Diagnosis not present

## 2022-08-22 DIAGNOSIS — Z7982 Long term (current) use of aspirin: Secondary | ICD-10-CM | POA: Diagnosis not present

## 2022-08-22 DIAGNOSIS — I69328 Other speech and language deficits following cerebral infarction: Secondary | ICD-10-CM | POA: Diagnosis not present

## 2022-08-22 DIAGNOSIS — I69354 Hemiplegia and hemiparesis following cerebral infarction affecting left non-dominant side: Secondary | ICD-10-CM | POA: Diagnosis not present

## 2022-08-22 DIAGNOSIS — R7303 Prediabetes: Secondary | ICD-10-CM | POA: Diagnosis not present

## 2022-08-22 DIAGNOSIS — J432 Centrilobular emphysema: Secondary | ICD-10-CM | POA: Diagnosis not present

## 2022-08-22 DIAGNOSIS — Z9181 History of falling: Secondary | ICD-10-CM | POA: Diagnosis not present

## 2022-08-22 DIAGNOSIS — Z7902 Long term (current) use of antithrombotics/antiplatelets: Secondary | ICD-10-CM | POA: Diagnosis not present

## 2022-08-22 DIAGNOSIS — E785 Hyperlipidemia, unspecified: Secondary | ICD-10-CM | POA: Diagnosis not present

## 2022-08-22 DIAGNOSIS — I69331 Monoplegia of upper limb following cerebral infarction affecting right dominant side: Secondary | ICD-10-CM | POA: Diagnosis not present

## 2022-08-23 ENCOUNTER — Encounter: Payer: Self-pay | Admitting: Nurse Practitioner

## 2022-08-24 NOTE — Patient Instructions (Signed)
Preventing High Cholesterol Cholesterol is a white, waxy substance similar to fat that the human body needs to help build cells. The liver makes all the cholesterol that a person's body needs. Having high cholesterol (hypercholesterolemia) increases your risk for heart disease and stroke. Extra or excess cholesterol comes from the food that you eat. High cholesterol can often be prevented with diet and lifestyle changes. If you already have high cholesterol, you can control it with diet, lifestyle changes, and medicines. How can high cholesterol affect me? If you have high cholesterol, fatty deposits (plaques) may build up on the walls of your blood vessels. The blood vessels that carry blood away from your heart are called arteries. Plaques make the arteries narrower and stiffer. This in turn can: Restrict or block blood flow and cause blood clots to form. Increase your risk for heart attack and stroke. What can increase my risk for high cholesterol? This condition is more likely to develop in people who: Eat foods that are high in saturated fat or cholesterol. Saturated fat is mostly found in foods that come from animal sources. Are overweight. Are not getting enough exercise. Use products that contain nicotine or tobacco, such as cigarettes, e-cigarettes, and chewing tobacco. Have a family history of high cholesterol (familial hypercholesterolemia). What actions can I take to prevent this? Nutrition  Eat less saturated fat. Avoid trans fats (partially hydrogenated oils). These are often found in margarine and in some baked goods, fried foods, and snacks bought in packages. Avoid precooked or cured meat, such as bacon, sausages, or meat loaves. Avoid foods and drinks that have added sugars. Eat more fruits, vegetables, and whole grains. Choose healthy sources of protein, such as fish, poultry, lean cuts of red meat, beans, peas, lentils, and nuts. Choose healthy sources of fat, such  as: Nuts. Vegetable oils, especially olive oil. Fish that have healthy fats, such as omega-3 fatty acids. These fish include mackerel or salmon. Lifestyle Lose weight if you are overweight. Maintaining a healthy body mass index (BMI) can help prevent or control high cholesterol. It can also lower your risk for diabetes and high blood pressure. Ask your health care provider to help you with a diet and exercise plan to lose weight safely. Do not use any products that contain nicotine or tobacco. These products include cigarettes, chewing tobacco, and vaping devices, such as e-cigarettes. If you need help quitting, ask your health care provider. Alcohol use Do not drink alcohol if: Your health care provider tells you not to drink. You are pregnant, may be pregnant, or are planning to become pregnant. If you drink alcohol: Limit how much you have to: 0-1 drink a day for women. 0-2 drinks a day for men. Know how much alcohol is in your drink. In the [place] one drink equals one 12 oz bottle of beer (355 mL), one 5 oz glass of wine (148 mL), or one 1 oz glass of hard liquor (44 mL). Activity  Get enough exercise. Do exercises as told by your health care provider. Each week, do at least 150 minutes of exercise that takes a medium level of effort (moderate-intensity exercise). This kind of exercise: Makes your heart beat faster while allowing you to still be able to talk. Can be done in short sessions several times a day or longer sessions a few times a week. For example, on 5 days each week, you could walk fast or ride your bike 3 times a day for 10 minutes each time. Medicines Your   health care provider may recommend medicines to help lower cholesterol. This may be a medicine to lower the amount of cholesterol that your liver makes. You may need medicine if: Diet and lifestyle changes have not lowered your cholesterol enough. You have high cholesterol and other risk factors for heart disease or  stroke. Take over-the-counter and prescription medicines only as told by your health care provider. General information Manage your risk factors for high cholesterol. Talk with your health care provider about all your risk factors and how to lower your risk. Manage other conditions that you have, such as diabetes or high blood pressure (hypertension). Have blood tests to check your cholesterol levels at regular points in time as told by your health care provider. Keep all follow-up visits. This is important. Where to find more information American Heart Association: www.heart.org National Heart, Lung, and Blood Institute: www.nhlbi.nih.gov Summary High cholesterol increases your risk for heart disease and stroke. By keeping your cholesterol level low, you can reduce your risk for these conditions. High cholesterol can often be prevented with diet and lifestyle changes. Work with your health care provider to manage your risk factors, and have your blood tested regularly. This information is not intended to replace advice given to you by your health care provider. Make sure you discuss any questions you have with your health care provider. Document Revised: 05/02/2022 Document Reviewed: 12/03/2020 Elsevier Patient Education  2023 Elsevier Inc.  

## 2022-08-25 DIAGNOSIS — Z7982 Long term (current) use of aspirin: Secondary | ICD-10-CM | POA: Diagnosis not present

## 2022-08-25 DIAGNOSIS — R7303 Prediabetes: Secondary | ICD-10-CM | POA: Diagnosis not present

## 2022-08-25 DIAGNOSIS — I69331 Monoplegia of upper limb following cerebral infarction affecting right dominant side: Secondary | ICD-10-CM | POA: Diagnosis not present

## 2022-08-25 DIAGNOSIS — Z7902 Long term (current) use of antithrombotics/antiplatelets: Secondary | ICD-10-CM | POA: Diagnosis not present

## 2022-08-25 DIAGNOSIS — I69354 Hemiplegia and hemiparesis following cerebral infarction affecting left non-dominant side: Secondary | ICD-10-CM | POA: Diagnosis not present

## 2022-08-25 DIAGNOSIS — Z9181 History of falling: Secondary | ICD-10-CM | POA: Diagnosis not present

## 2022-08-25 DIAGNOSIS — E785 Hyperlipidemia, unspecified: Secondary | ICD-10-CM | POA: Diagnosis not present

## 2022-08-25 DIAGNOSIS — I5032 Chronic diastolic (congestive) heart failure: Secondary | ICD-10-CM | POA: Diagnosis not present

## 2022-08-25 DIAGNOSIS — J432 Centrilobular emphysema: Secondary | ICD-10-CM | POA: Diagnosis not present

## 2022-08-25 DIAGNOSIS — I69328 Other speech and language deficits following cerebral infarction: Secondary | ICD-10-CM | POA: Diagnosis not present

## 2022-08-26 ENCOUNTER — Encounter: Payer: Self-pay | Admitting: Nurse Practitioner

## 2022-08-26 ENCOUNTER — Ambulatory Visit (INDEPENDENT_AMBULATORY_CARE_PROVIDER_SITE_OTHER): Payer: Medicare Other | Admitting: Nurse Practitioner

## 2022-08-26 VITALS — BP 136/75 | HR 80 | Temp 97.9°F | Ht 67.0 in | Wt 220.0 lb

## 2022-08-26 DIAGNOSIS — E785 Hyperlipidemia, unspecified: Secondary | ICD-10-CM | POA: Diagnosis not present

## 2022-08-26 DIAGNOSIS — Z8673 Personal history of transient ischemic attack (TIA), and cerebral infarction without residual deficits: Secondary | ICD-10-CM

## 2022-08-26 DIAGNOSIS — E538 Deficiency of other specified B group vitamins: Secondary | ICD-10-CM | POA: Diagnosis not present

## 2022-08-26 DIAGNOSIS — E1169 Type 2 diabetes mellitus with other specified complication: Secondary | ICD-10-CM | POA: Diagnosis not present

## 2022-08-26 LAB — MICROSCOPIC EXAMINATION: Bacteria, UA: NONE SEEN

## 2022-08-26 LAB — URINALYSIS, ROUTINE W REFLEX MICROSCOPIC
Bilirubin, UA: NEGATIVE
Glucose, UA: NEGATIVE
Ketones, UA: NEGATIVE
Leukocytes,UA: NEGATIVE
Nitrite, UA: NEGATIVE
Protein,UA: NEGATIVE
Specific Gravity, UA: 1.015 (ref 1.005–1.030)
Urobilinogen, Ur: 0.2 mg/dL (ref 0.2–1.0)
pH, UA: 5 (ref 5.0–7.5)

## 2022-08-26 NOTE — Assessment & Plan Note (Signed)
Chronic, ongoing. Continue Zetia at this time as is tolerating.  Did not tolerate Rosuvastatin 20 MG three days a week or any other statin.  Have tried multiple statins with ongoing myalgias with use.  Praluent and Zetia also caused discomfort.  Check lipid panel and CMP today.  CCM collaboration continues.

## 2022-08-26 NOTE — Assessment & Plan Note (Signed)
History of in 2019, most recent even 10/15/87 = atheroembolic causing CVA (cerebral vascular accident) of right MCA distribution.  At this time continue Zetia, has not tolerated statins in past, even on decreased pattern.  Check labs today: Lipid, CM.  Discussed neuro goals: A1c <6.5% (current 6.4%), BP <130/80, and LDL <70. Scheduled to see neurology 09/11/22 for initial visit. Continue at home therapy, however concern with OT, recommend she reach out to company to discussed her concerns as minimal strengthening noted in right hand.

## 2022-08-26 NOTE — Progress Notes (Signed)
BP 136/75   Pulse 80   Temp 97.9 F (36.6 C) (Oral)   Ht _0  (1.702 m)   Wt 220 lb (99.8 kg)   LMP  (LMP Unknown)   SpO2 97%   BMI 34.46 kg/m    Subjective:    Patient ID: Sharon [name], female    DOB: 07/09/1946, 76 y.o.   MRN: [id number]  HPI: Sharon [name] is a 76 y.o. female  Chief Complaint  Patient presents with   Hyperlipidemia   Cerebrovascular Accident   HYPERLIPIDEMIA Currently taking Zetia.  Has not tolerated any statins and with Praluent had redness from injection (swelling and redness to injection site).  Scheduled to see neurology 09/11/22 for initial visit.  Would like urine checked today as she is concerned she is dehydrated.  Was admitted to [facility] on 07/02/22 for CVA event, history of CVA in past (2019) -- is having some slurring in speech post CVA.   She currently has in home therapy, as difficult to leave house due to husband's house.  They were late starting with her (2 weeks late) -- working with speech therapy + OT/PT.  She feels OT has not offered much value to her -- she continues to have right handed weakness (is right hand dominant).  Speech as been working well with her -- she passed memory testing with her.  PT has been excellent, Jeneen Rinks.  Still struggling with drinking water, was given thickener to go in all liquid products by speech therapy -- showed her exercises to work on. Hyperlipidemia status: good compliance Satisfied with current treatment?  yes Side effects:  no Medication compliance: good compliance Past cholesterol meds: statins multiple and Praluent Supplements: none Aspirin:  no The ASCVD Risk score (Arnett DK, et al., 2019) failed to calculate for the following reasons:   The patient has a prior MI or stroke diagnosis Chest pain:  no Coronary artery disease:  no Family history CAD:  yes Family history early CAD:  no   Relevant past medical, surgical, family and social history reviewed and updated as indicated. Interim medical  history since our last visit reviewed. Allergies and medications reviewed and updated.  Review of Systems  Constitutional:  Negative for activity change, appetite change, diaphoresis, fatigue and fever.  Respiratory:  Negative for cough, chest tightness, shortness of breath and wheezing.   Cardiovascular:  Positive for leg swelling (at baseline). Negative for chest pain and palpitations.  Gastrointestinal: Negative.   Endocrine: Negative for polydipsia, polyphagia and polyuria.  Neurological:  Positive for speech difficulty and weakness (right hand). Negative for dizziness, tremors, facial asymmetry, light-headedness, numbness and headaches.  Psychiatric/Behavioral: Negative.     Per HPI unless specifically indicated above     Objective:    BP 136/75   Pulse 80   Temp 97.9 F (36.6 C) (Oral)   Ht _1  (1.702 m)   Wt 220 lb (99.8 kg)   LMP  (LMP Unknown)   SpO2 97%   BMI 34.46 kg/m   Wt Readings from Last 3 Encounters:  08/26/22 220 lb (99.8 kg)  07/16/22 223 lb 11.2 oz (101.5 kg)  07/02/22 225 lb 1.4 oz (102.1 kg)    Physical Exam Vitals and nursing note reviewed.  Constitutional:      General: She is awake. She is not in acute distress.    Appearance: She is well-developed and well-groomed. She is obese. She is not ill-appearing or toxic-appearing.  HENT:     Head: Normocephalic.  Right Ear: Hearing normal.     Left Ear: Hearing normal.  Eyes:     General: Lids are normal.        Right eye: No discharge.        Left eye: No discharge.     Conjunctiva/sclera: Conjunctivae normal.  Cardiovascular:     Rate and Rhythm: Normal rate and regular rhythm.     Pulses:          Dorsalis pedis pulses are 2+ on the right side and 2+ on the left side.       Posterior tibial pulses are 2+ on the right side and 2+ on the left side.     Heart sounds: No murmur heard.    No gallop.  Pulmonary:     Effort: Pulmonary effort is normal. No accessory muscle usage or respiratory  distress.     Breath sounds: Normal breath sounds.  Musculoskeletal:     Cervical back: Normal range of motion.     Right lower leg: 2+ Edema present.     Left lower leg: 2+ Edema present.  Skin:    Findings: Bruising (mild noted to upper extremities.) present.     Comments: Mild erythema to bilateral shins extending from ankle to just below knee L>R leg.  Baseline edema present.  No warmth or drainage.  Skin intact.  Neurological:     Mental Status: She is alert and oriented to person, place, and time.     Cranial Nerves: Cranial nerves 2-12 are intact.     Motor: Weakness (right hand weakness) present.     Coordination: Coordination is intact.     Gait: Gait is intact.     Deep Tendon Reflexes: Reflexes are normal and symmetric.     Reflex Scores:      Brachioradialis reflexes are 2+ on the right side and 2+ on the left side.      Patellar reflexes are 2+ on the right side and 2+ on the left side.    Comments: Occasional slurred speech noted.  Psychiatric:        Attention and Perception: Attention normal.        Mood and Affect: Mood normal.        Behavior: Behavior normal. Behavior is cooperative.        Thought Content: Thought content normal.        Judgment: Judgment normal.     Results for orders placed or performed in visit on 07/16/22  CBC with Differential/Platelet  Result Value Ref Range   WBC 7.7 3.4 - 10.8 x10E3/uL   RBC 4.73 3.77 - 5.28 x10E6/uL   Hemoglobin 14.3 11.1 - 15.9 g/dL   Hematocrit 42.0 34.0 - 46.6 %   MCV 89 79 - 97 fL   MCH 30.2 26.6 - 33.0 pg   MCHC 34.0 31.5 - 35.7 g/dL   RDW 12.7 11.7 - 15.4 %   Platelets 234 150 - 450 x10E3/uL   Neutrophils 75 Not Estab. %   Lymphs 16 Not Estab. %   Monocytes 8 Not Estab. %   Eos 1 Not Estab. %   Basos 0 Not Estab. %   Neutrophils Absolute 5.8 1.4 - 7.0 x10E3/uL   Lymphocytes Absolute 1.2 0.7 - 3.1 x10E3/uL   Monocytes Absolute 0.6 0.1 - 0.9 x10E3/uL   EOS (ABSOLUTE) 0.1 0.0 - 0.4 x10E3/uL   Basophils  Absolute 0.0 0.0 - 0.2 x10E3/uL   Immature Granulocytes 0 Not Estab. %   Immature  Grans (Abs) 0.0 0.0 - 0.1 x10E3/uL  Comprehensive metabolic panel  Result Value Ref Range   Glucose 120 (H) 70 - 99 mg/dL   BUN 20 8 - 27 mg/dL   Creatinine, Ser 1.12 (H) 0.57 - 1.00 mg/dL   eGFR 51 (L) >59 mL/min/1.73   BUN/Creatinine Ratio 18 12 - 28   Sodium 139 134 - 144 mmol/L   Potassium 4.3 3.5 - 5.2 mmol/L   Chloride 103 96 - 106 mmol/L   CO2 19 (L) 20 - 29 mmol/L   Calcium 9.2 8.7 - 10.3 mg/dL   Total Protein 6.6 6.0 - 8.5 g/dL   Albumin 4.3 3.8 - 4.8 g/dL   Globulin, Total 2.3 1.5 - 4.5 g/dL   Albumin/Globulin Ratio 1.9 1.2 - 2.2   Bilirubin Total 0.3 0.0 - 1.2 mg/dL   Alkaline Phosphatase 91 44 - 121 IU/L   AST 18 0 - 40 IU/L   ALT 17 0 - 32 IU/L  Lipid Panel w/o Chol/HDL Ratio  Result Value Ref Range   Cholesterol, Total 175 100 - 199 mg/dL   Triglycerides 116 0 - 149 mg/dL   HDL 63 >39 mg/dL   VLDL Cholesterol Cal 21 5 - 40 mg/dL   LDL Chol Calc (NIH) 91 0 - 99 mg/dL  TSH  Result Value Ref Range   TSH 1.840 0.450 - 4.500 uIU/mL  Vitamin B12  Result Value Ref Range   Vitamin B-12 840 232 - 1,245 pg/mL   *Note: Due to a large number of results and/or encounters for the requested time period, some results have not been displayed. A complete set of results can be found in Results Review.      Assessment & Plan:   Problem List Items Addressed This Visit       Endocrine   Hyperlipidemia associated with type 2 diabetes mellitus ([place]) - Primary    Chronic, ongoing. Continue Zetia at this time as is tolerating.  Did not tolerate Rosuvastatin 20 MG three days a week or any other statin.  Have tried multiple statins with ongoing myalgias with use.  Praluent and Zetia also caused discomfort.  Check lipid panel and CMP today.  CCM collaboration continues.      Relevant Orders   Urinalysis, Routine w reflex microscopic   Comprehensive metabolic panel   Lipid Panel w/o Chol/HDL Ratio      Other   History of CVA (cerebrovascular accident)    History of in 2019, most recent even 6/96/29 = atheroembolic causing CVA (cerebral vascular accident) of right MCA distribution.  At this time continue Zetia, has not tolerated statins in past, even on decreased pattern.  Check labs today: Lipid, CM.  Discussed neuro goals: A1c <6.5% (current 6.4%), BP <130/80, and LDL <70. Scheduled to see neurology 09/11/22 for initial visit. Continue at home therapy, however concern with OT, recommend she reach out to company to discussed her concerns as minimal strengthening noted in right hand.      Relevant Orders   Urinalysis, Routine w reflex microscopic   Vitamin B12 deficiency    Chronic, stable.  Continue monthly injections.  Level up to date on labs and stable.        Follow up plan: Return in about 6 weeks (around 10/07/2022) for Post CVA -- strength check.

## 2022-08-26 NOTE — Assessment & Plan Note (Signed)
Chronic, stable.  Continue monthly injections.  Level up to date on labs and stable.

## 2022-08-27 DIAGNOSIS — I69354 Hemiplegia and hemiparesis following cerebral infarction affecting left non-dominant side: Secondary | ICD-10-CM | POA: Diagnosis not present

## 2022-08-27 DIAGNOSIS — Z7902 Long term (current) use of antithrombotics/antiplatelets: Secondary | ICD-10-CM | POA: Diagnosis not present

## 2022-08-27 DIAGNOSIS — I69328 Other speech and language deficits following cerebral infarction: Secondary | ICD-10-CM | POA: Diagnosis not present

## 2022-08-27 DIAGNOSIS — Z9181 History of falling: Secondary | ICD-10-CM | POA: Diagnosis not present

## 2022-08-27 DIAGNOSIS — Z7982 Long term (current) use of aspirin: Secondary | ICD-10-CM | POA: Diagnosis not present

## 2022-08-27 DIAGNOSIS — I69331 Monoplegia of upper limb following cerebral infarction affecting right dominant side: Secondary | ICD-10-CM | POA: Diagnosis not present

## 2022-08-27 DIAGNOSIS — E785 Hyperlipidemia, unspecified: Secondary | ICD-10-CM | POA: Diagnosis not present

## 2022-08-27 DIAGNOSIS — R7303 Prediabetes: Secondary | ICD-10-CM | POA: Diagnosis not present

## 2022-08-27 DIAGNOSIS — I5032 Chronic diastolic (congestive) heart failure: Secondary | ICD-10-CM | POA: Diagnosis not present

## 2022-08-27 DIAGNOSIS — J432 Centrilobular emphysema: Secondary | ICD-10-CM | POA: Diagnosis not present

## 2022-08-27 LAB — COMPREHENSIVE METABOLIC PANEL
ALT: 19 IU/L (ref 0–32)
AST: 19 IU/L (ref 0–40)
Albumin/Globulin Ratio: 1.8 (ref 1.2–2.2)
Albumin: 4.2 g/dL (ref 3.8–4.8)
Alkaline Phosphatase: 89 IU/L (ref 44–121)
BUN/Creatinine Ratio: 15 (ref 12–28)
BUN: 14 mg/dL (ref 8–27)
Bilirubin Total: 0.5 mg/dL (ref 0.0–1.2)
CO2: 23 mmol/L (ref 20–29)
Calcium: 9.2 mg/dL (ref 8.7–10.3)
Chloride: 103 mmol/L (ref 96–106)
Creatinine, Ser: 0.96 mg/dL (ref 0.57–1.00)
Globulin, Total: 2.4 g/dL (ref 1.5–4.5)
Glucose: 123 mg/dL — ABNORMAL HIGH (ref 70–99)
Potassium: 4.8 mmol/L (ref 3.5–5.2)
Sodium: 142 mmol/L (ref 134–144)
Total Protein: 6.6 g/dL (ref 6.0–8.5)
eGFR: 61 mL/min/{1.73_m2} (ref >59)

## 2022-08-27 LAB — LIPID PANEL W/O CHOL/HDL RATIO
Cholesterol, Total: 175 mg/dL (ref 100–199)
HDL: 62 mg/dL (ref >39)
LDL Chol Calc (NIH): 99 mg/dL (ref 0–99)
Triglycerides: 74 mg/dL (ref 0–149)
VLDL Cholesterol Cal: 14 mg/dL (ref 5–40)

## 2022-08-27 NOTE — Progress Notes (Signed)
Contacted via [place] [facility], your labs have returned: - Kidney function, creatinine and eGFR, remains normal, as is liver function, AST and ALT.  - Glucose, sugar, a tad elevated, we will monitor. - Cholesterol levels remains stable with Zetia, not at stroke prevention goal as we would like to see LDL <70, but that LDL is better with Zetia on board.  Continue this.  Any questions?  Keep being amazing!!  Thank you for allowing me to participate in your care.  I appreciate you. Kindest regards, Keani Gotcher

## 2022-08-28 ENCOUNTER — Ambulatory Visit: Payer: Medicare Other

## 2022-08-29 ENCOUNTER — Telehealth: Payer: Self-pay | Admitting: Nurse Practitioner

## 2022-08-29 DIAGNOSIS — Z7982 Long term (current) use of aspirin: Secondary | ICD-10-CM | POA: Diagnosis not present

## 2022-08-29 DIAGNOSIS — I5032 Chronic diastolic (congestive) heart failure: Secondary | ICD-10-CM | POA: Diagnosis not present

## 2022-08-29 DIAGNOSIS — I69331 Monoplegia of upper limb following cerebral infarction affecting right dominant side: Secondary | ICD-10-CM | POA: Diagnosis not present

## 2022-08-29 DIAGNOSIS — J432 Centrilobular emphysema: Secondary | ICD-10-CM | POA: Diagnosis not present

## 2022-08-29 DIAGNOSIS — Z7902 Long term (current) use of antithrombotics/antiplatelets: Secondary | ICD-10-CM | POA: Diagnosis not present

## 2022-08-29 DIAGNOSIS — R7303 Prediabetes: Secondary | ICD-10-CM | POA: Diagnosis not present

## 2022-08-29 DIAGNOSIS — I69328 Other speech and language deficits following cerebral infarction: Secondary | ICD-10-CM | POA: Diagnosis not present

## 2022-08-29 DIAGNOSIS — E785 Hyperlipidemia, unspecified: Secondary | ICD-10-CM | POA: Diagnosis not present

## 2022-08-29 DIAGNOSIS — Z9181 History of falling: Secondary | ICD-10-CM | POA: Diagnosis not present

## 2022-08-29 DIAGNOSIS — I69354 Hemiplegia and hemiparesis following cerebral infarction affecting left non-dominant side: Secondary | ICD-10-CM | POA: Diagnosis not present

## 2022-08-29 NOTE — Telephone Encounter (Signed)
Returned [place] call with approval for verbal orders

## 2022-08-29 NOTE — Telephone Encounter (Signed)
Stephanie Calling from [facility] is calling for the below orders.

## 2022-08-29 NOTE — Telephone Encounter (Signed)
Home Health Verbal Orders - Caller/Agency:  Callback Number: [phone] Requesting OT/PT/Skilled Nursing/Social Work/Speech Therapy: OT  Frequency:   1w1 for OT recert

## 2022-09-02 ENCOUNTER — Ambulatory Visit (INDEPENDENT_AMBULATORY_CARE_PROVIDER_SITE_OTHER): Payer: Medicare Other | Admitting: Nurse Practitioner

## 2022-09-02 ENCOUNTER — Encounter (INDEPENDENT_AMBULATORY_CARE_PROVIDER_SITE_OTHER): Payer: Self-pay | Admitting: Nurse Practitioner

## 2022-09-02 VITALS — BP 148/83 | HR 89 | Resp 16 | Ht 69.0 in | Wt 221.0 lb

## 2022-09-02 DIAGNOSIS — I7123 Aneurysm of the descending thoracic aorta, without rupture: Secondary | ICD-10-CM

## 2022-09-02 DIAGNOSIS — I890 Lymphedema, not elsewhere classified: Secondary | ICD-10-CM

## 2022-09-02 DIAGNOSIS — I714 Abdominal aortic aneurysm, without rupture, unspecified: Secondary | ICD-10-CM | POA: Diagnosis not present

## 2022-09-03 ENCOUNTER — Telehealth: Payer: Self-pay | Admitting: Nurse Practitioner

## 2022-09-03 ENCOUNTER — Telehealth: Payer: Self-pay

## 2022-09-03 DIAGNOSIS — I5032 Chronic diastolic (congestive) heart failure: Secondary | ICD-10-CM | POA: Diagnosis not present

## 2022-09-03 DIAGNOSIS — I69331 Monoplegia of upper limb following cerebral infarction affecting right dominant side: Secondary | ICD-10-CM | POA: Diagnosis not present

## 2022-09-03 DIAGNOSIS — I69328 Other speech and language deficits following cerebral infarction: Secondary | ICD-10-CM | POA: Diagnosis not present

## 2022-09-03 DIAGNOSIS — Z7902 Long term (current) use of antithrombotics/antiplatelets: Secondary | ICD-10-CM | POA: Diagnosis not present

## 2022-09-03 DIAGNOSIS — E785 Hyperlipidemia, unspecified: Secondary | ICD-10-CM | POA: Diagnosis not present

## 2022-09-03 DIAGNOSIS — I69354 Hemiplegia and hemiparesis following cerebral infarction affecting left non-dominant side: Secondary | ICD-10-CM | POA: Diagnosis not present

## 2022-09-03 DIAGNOSIS — J432 Centrilobular emphysema: Secondary | ICD-10-CM | POA: Diagnosis not present

## 2022-09-03 DIAGNOSIS — R7303 Prediabetes: Secondary | ICD-10-CM | POA: Diagnosis not present

## 2022-09-03 DIAGNOSIS — Z9181 History of falling: Secondary | ICD-10-CM | POA: Diagnosis not present

## 2022-09-03 DIAGNOSIS — Z7982 Long term (current) use of aspirin: Secondary | ICD-10-CM | POA: Diagnosis not present

## 2022-09-03 NOTE — Telephone Encounter (Signed)
Physician orders have been faxed back to [facility]

## 2022-09-03 NOTE — Telephone Encounter (Signed)
Returned [employer] and she was given Verbal orders

## 2022-09-03 NOTE — Telephone Encounter (Signed)
Home Health Verbal Orders - Caller/Agency: Stephanie Callback Number:[phone]/ Vm can left  Requesting OT Frequency:  1 x a week for 4 weeks

## 2022-09-06 ENCOUNTER — Encounter (INDEPENDENT_AMBULATORY_CARE_PROVIDER_SITE_OTHER): Payer: Self-pay | Admitting: Nurse Practitioner

## 2022-09-06 NOTE — Progress Notes (Signed)
Subjective:    Patient ID: Sharon [name], female    DOB: 08/17/1946, 76 y.o.   MRN: [id number] Chief Complaint  Patient presents with   Follow-up    6 months no studies    Sharon [name] is a 76 year old female who presents today for evaluation of her lymphedema.  The patient has a longstanding history of lymphedema.  She most recently had a stroke several months ago and since that time the patient has had some worsening lower extremity edema in the right lower extremity.  However her right lower extremity is what was most affected during her stroke.  She does continue to use medical grade compression as well as her lymphedema pump.    Review of Systems  Cardiovascular:  Positive for leg swelling.  All other systems reviewed and are negative.      Objective:   Physical Exam Vitals reviewed.  HENT:     Head: Normocephalic.  Cardiovascular:     Rate and Rhythm: Normal rate.     Pulses: Normal pulses.  Pulmonary:     Effort: Pulmonary effort is normal.  Musculoskeletal:     Right lower leg: Edema present.  Skin:    General: Skin is warm and dry.  Neurological:     Mental Status: She is alert and oriented to person, place, and time.  Psychiatric:        Mood and Affect: Mood normal.        Behavior: Behavior normal.        Thought Content: Thought content normal.        Judgment: Judgment normal.     BP (!) 148/83 (BP Location: Left Arm)   Pulse 89   Resp 16   Ht _0  (1.753 m)   Wt 221 lb (100.2 kg)   LMP  (LMP Unknown)   BMI 32.64 kg/m   Past Medical History:  Diagnosis Date   Anxiety    Arthritis    hands, upper back   Asthma    COPD (chronic obstructive pulmonary disease) (HCC)    History of cervical cancer    Menopausal disorder    Osteoporosis    Pneumonia 1960   Spasm of abdominal muscles of right side    intermittent   Stroke ([place]) 2019   TMJ (dislocation of temporomandibular joint)    Wears dentures    partial lower    Social History    Socioeconomic History   Marital status: Married    Spouse name: Anh Bigos   Number of children: 1   Years of education: Not on file   Highest education level: Some college, no degree  Occupational History   Occupation: retired  Tobacco Use   Smoking status: Former    Years: 56.00    Types: Cigarettes    Quit date: 07/14/2016    Years since quitting: 6.1   Smokeless tobacco: Never  Vaping Use   Vaping Use: Former  Substance and Sexual Activity   Alcohol use: Yes    Comment: Ecologist   Drug use: No   Sexual activity: Not Currently    Birth control/protection: Post-menopausal  Other Topics Concern   Not on file  Social History Narrative   Lives with husband, manages farm   Social Determinants of Health   Financial Resource Strain: Low Risk  (06/03/2022)   Overall Financial Resource Strain (CARDIA)    Difficulty of Paying Living Expenses: Not hard at all  Food Insecurity: No Food Insecurity (07/03/2022)  Hunger Vital Sign    Worried About Running Out of Food in the Last Year: Never true    Ran Out of Food in the Last Year: Never true  Transportation Needs: No Transportation Needs (07/03/2022)   PRAPARE - Hydrologist (Medical): No    Lack of Transportation (Non-Medical): No  Physical Activity: Inactive (06/03/2022)   Exercise Vital Sign    Days of Exercise per Week: 0 days    Minutes of Exercise per Session: 0 min  Stress: No Stress Concern Present (06/03/2022)   [place]    Feeling of Stress : Only a little  Social Connections: Moderately Isolated (06/03/2022)   Social Connection and Isolation Panel [NHANES]    Frequency of Communication with Friends and Family: More than three times a week    Frequency of Social Gatherings with Friends and Family: Never    Attends Religious Services: Never    Marine scientist or Organizations: No    Attends Theatre manager Meetings: Never    Marital Status: Married  Human resources officer Violence: Not At Risk (07/03/2022)   Humiliation, Afraid, Rape, and Kick questionnaire    Fear of Current or Ex-Partner: No    Emotionally Abused: No    Physically Abused: No    Sexually Abused: No    Past Surgical History:  Procedure Laterality Date   ABDOMINAL HYSTERECTOMY  1970's   bladder botox  2005   BLADDER SUSPENSION  2004   CATARACT EXTRACTION W/PHACO Right 03/09/2018   Procedure: CATARACT EXTRACTION PHACO AND INTRAOCULAR LENS PLACEMENT ([place]) right;  Surgeon: Eulogio Bear, MD;  Location: [place];  Service: Ophthalmology;  Laterality: Right;  CALL CELL 1ST   CATARACT EXTRACTION W/PHACO Left 04/19/2018   Procedure: CATARACT EXTRACTION PHACO AND INTRAOCULAR LENS PLACEMENT ([facility])  LEFT;  Surgeon: Eulogio Bear, MD;  Location: [place];  Service: Ophthalmology;  Laterality: Left;   COLONOSCOPY WITH PROPOFOL N/A 12/13/2015   Procedure: COLONOSCOPY WITH PROPOFOL;  Surgeon: Lucilla Lame, MD;  Location: [place];  Service: Endoscopy;  Laterality: N/A;   COLONOSCOPY WITH PROPOFOL N/A 03/14/2021   Procedure: COLONOSCOPY WITH PROPOFOL;  Surgeon: Lucilla Lame, MD;  Location: [place];  Service: Endoscopy;  Laterality: N/A;  diabetic   LOOP RECORDER INSERTION N/A 01/07/2018   Procedure: LOOP RECORDER INSERTION;  Surgeon: Deboraha Sprang, MD;  Location: [place] [facility];  Service: Cardiovascular;  Laterality: N/A;   POLYPECTOMY N/A 12/13/2015   Procedure: POLYPECTOMY;  Surgeon: Lucilla Lame, MD;  Location: [place];  Service: Endoscopy;  Laterality: N/A;  SIGMOID COLON POLYPS X  5   POLYPECTOMY N/A 03/14/2021   Procedure: POLYPECTOMY;  Surgeon: Lucilla Lame, MD;  Location: [place];  Service: Endoscopy;  Laterality: N/A;   SHOULDER ARTHROSCOPY W/ ROTATOR CUFF REPAIR Right 1998   TEE WITHOUT CARDIOVERSION N/A 01/06/2018   Procedure:  TRANSESOPHAGEAL ECHOCARDIOGRAM (TEE);  Surgeon: Minna Merritts, MD;  Location: [facility];  Service: Cardiovascular;  Laterality: N/A;   TONSILLECTOMY AND ADENOIDECTOMY     TOOTH EXTRACTION     July 2023    Family History  Problem Relation Age of Onset   Diabetes Mother    Heart disease Mother    Stroke Mother    Stroke Maternal Grandmother    Hyperlipidemia Neg Hx     Allergies  Allergen Reactions   Statins Other (See Comments)  myalgia   Dermacerin [Aquaphilic]    Nsaids    Quinolones     FLUOROQUINOLONES - Pt reports she was told to NEVER take these.   Baclofen Swelling   Bactrim [Sulfamethoxazole-Trimethoprim] Nausea And Vomiting   Ibuprofen Rash    Mouth swelling   Lanolin Rash    Allergy to triamcinolone    Latex Rash    Some bandaids, some gloves; BLOOD TEST NEGATIVE   Librium [Chlordiazepoxide] Itching    Dizziness    Naprosyn [Naproxen] Rash    Mouth swelling   Other Rash    [place] nuts - mouth swelling       Latest Ref Rng & Units 07/16/2022    3:50 PM 07/02/2022    9:45 PM 06/26/2022   12:07 PM  CBC  WBC 3.4 - 10.8 x10E3/uL 7.7  10.0  8.3   Hemoglobin 11.1 - 15.9 g/dL 14.3  14.8  15.1   Hematocrit 34.0 - 46.6 % 42.0  45.1  46.3   Platelets 150 - 450 x10E3/uL 234  233  237       CMP     Component Value Date/Time   NA 142 08/26/2022 1142   K 4.8 08/26/2022 1142   CL 103 08/26/2022 1142   CO2 23 08/26/2022 1142   GLUCOSE 123 (H) 08/26/2022 1142   GLUCOSE 120 (H) 07/04/2022 0449   BUN 14 08/26/2022 1142   CREATININE 0.96 08/26/2022 1142   CALCIUM 9.2 08/26/2022 1142   PROT 6.6 08/26/2022 1142   ALBUMIN 4.2 08/26/2022 1142   AST 19 08/26/2022 1142   ALT 19 08/26/2022 1142   ALKPHOS 89 08/26/2022 1142   BILITOT 0.5 08/26/2022 1142   GFRNONAA >60 07/04/2022 0449   GFRAA 67 07/17/2020 1509     No results found.     Assessment & Plan:   1. Lymphedema Today the patient's lymphedema is slightly worse on her right lower extremity  however given her recent stroke this is not unsurprising as she does have some residual weakness on this side.  Patient is advised to continue with use of medical grade compression stockings as well as elevation and activity as tolerable.  We will reevaluate patient in 6 months or sooner if issues arise.  2. Aneurysm of descending thoracic aorta without rupture (HCC) The patient had a recent CT scan which showed an approximate 4 cm descending thoracic aortic aneurysm which is consistent with the last several years.  This will continue to monitor annually with her CT scans for lung cancer screening.  3. Abdominal aortic aneurysm (AAA) without rupture, unspecified part ([place]) Most recent studies revealed a stable aortic aneurysm less than 4 cm in size.  We will continue to monitor this annually.  Next evaluation for the patient will be in 6 months   Current Outpatient Medications on File Prior to Visit  Medication Sig Dispense Refill   acetaminophen (TYLENOL) 500 MG tablet Take 500 mg by mouth every 6 (six) hours as needed.     albuterol (PROAIR HFA) 108 (90 Base) MCG/ACT inhaler Inhale 2 puffs into the lungs every 6 (six) hours as needed for wheezing or shortness of breath. 18 g 4   aspirin EC 81 MG tablet Take 1 tablet (81 mg total) by mouth daily. Swallow whole. 30 tablet 2   Blood Glucose Monitoring Suppl (ONETOUCH VERIO) w/Device KIT Utilize to check blood sugar twice a day, fasting in morning with goal < 130 and then 2 hours after a meal with goal <180.  Document and bring to visits. 1 kit 0   Budeson-Glycopyrrol-Formoterol (BREZTRI AEROSPHERE) 160-9-4.8 MCG/ACT AERO Inhale 2 puffs into the lungs 2 (two) times daily. 10.7 g 11   Cholecalciferol 1.25 MG (50000 UT) TABS Take 1 tablet by mouth once a week. 14 tablet 4   citalopram (CELEXA) 20 MG tablet Take 1 tablet (20 mg total) by mouth as needed. 90 tablet 4   clopidogrel (PLAVIX) 75 MG tablet TAKE 1 TABLET(75 MG) BY MOUTH DAILY 90 tablet 2    Crisaborole (EUCRISA) 2 % OINT Apply 1 application. topically daily. Use for itching and inflammation at left leg 100 g 1   ezetimibe (ZETIA) 10 MG tablet Take 1 tablet (10 mg total) by mouth daily. 30 tablet 11   glucose blood (ONETOUCH VERIO) test strip UTILIIZE TO CHECK BLOOD SUGAR TWICE DAILY FASTING IN MORNING WITH GOAL<130 AND THAN 2 HOURS AFTER A MEAL WITH GGOAL<180 100 strip 3   Lancets (ONETOUCH ULTRASOFT) lancets Utilize to check blood sugar twice a day, fasting in morning with goal < 130 and then 2 hours after a meal with goal <180.  Document and bring to visits. 100 each 12   loratadine (CLARITIN) 10 MG tablet Take 1 tablet (10 mg total) by mouth daily. 90 tablet 4   pantoprazole (PROTONIX) 40 MG tablet TAKE 1 TABLET(40 MG) BY MOUTH DAILY 90 tablet 2   phenazopyridine (PYRIDIUM) 100 MG tablet Take 1 tablet (100 mg total) by mouth 3 (three) times daily as needed for pain. (Patient not taking: Reported on 09/02/2022) 10 tablet 0   No current facility-administered medications on file prior to visit.    There are no Patient Instructions on file for this visit. No follow-ups on file.   Kris Hartmann, NP

## 2022-09-10 ENCOUNTER — Telehealth: Payer: Self-pay

## 2022-09-10 NOTE — Telephone Encounter (Signed)
Home health care orders faxed back to [facility] 09/09/22

## 2022-09-11 DIAGNOSIS — I639 Cerebral infarction, unspecified: Secondary | ICD-10-CM | POA: Diagnosis not present

## 2022-09-11 DIAGNOSIS — R531 Weakness: Secondary | ICD-10-CM | POA: Diagnosis not present

## 2022-09-11 DIAGNOSIS — H43813 Vitreous degeneration, bilateral: Secondary | ICD-10-CM | POA: Diagnosis not present

## 2022-09-11 DIAGNOSIS — R1310 Dysphagia, unspecified: Secondary | ICD-10-CM | POA: Insufficient documentation

## 2022-09-11 DIAGNOSIS — R4781 Slurred speech: Secondary | ICD-10-CM | POA: Diagnosis not present

## 2022-09-12 DIAGNOSIS — R7303 Prediabetes: Secondary | ICD-10-CM | POA: Diagnosis not present

## 2022-09-12 DIAGNOSIS — E785 Hyperlipidemia, unspecified: Secondary | ICD-10-CM | POA: Diagnosis not present

## 2022-09-12 DIAGNOSIS — Z7902 Long term (current) use of antithrombotics/antiplatelets: Secondary | ICD-10-CM | POA: Diagnosis not present

## 2022-09-12 DIAGNOSIS — I69354 Hemiplegia and hemiparesis following cerebral infarction affecting left non-dominant side: Secondary | ICD-10-CM | POA: Diagnosis not present

## 2022-09-12 DIAGNOSIS — I69328 Other speech and language deficits following cerebral infarction: Secondary | ICD-10-CM | POA: Diagnosis not present

## 2022-09-12 DIAGNOSIS — J432 Centrilobular emphysema: Secondary | ICD-10-CM | POA: Diagnosis not present

## 2022-09-12 DIAGNOSIS — Z7982 Long term (current) use of aspirin: Secondary | ICD-10-CM | POA: Diagnosis not present

## 2022-09-12 DIAGNOSIS — Z9181 History of falling: Secondary | ICD-10-CM | POA: Diagnosis not present

## 2022-09-12 DIAGNOSIS — I5032 Chronic diastolic (congestive) heart failure: Secondary | ICD-10-CM | POA: Diagnosis not present

## 2022-09-17 DIAGNOSIS — I5032 Chronic diastolic (congestive) heart failure: Secondary | ICD-10-CM | POA: Diagnosis not present

## 2022-09-17 DIAGNOSIS — Z7902 Long term (current) use of antithrombotics/antiplatelets: Secondary | ICD-10-CM | POA: Diagnosis not present

## 2022-09-17 DIAGNOSIS — Z7982 Long term (current) use of aspirin: Secondary | ICD-10-CM | POA: Diagnosis not present

## 2022-09-17 DIAGNOSIS — E785 Hyperlipidemia, unspecified: Secondary | ICD-10-CM | POA: Diagnosis not present

## 2022-09-17 DIAGNOSIS — R7303 Prediabetes: Secondary | ICD-10-CM | POA: Diagnosis not present

## 2022-09-17 DIAGNOSIS — I69354 Hemiplegia and hemiparesis following cerebral infarction affecting left non-dominant side: Secondary | ICD-10-CM | POA: Diagnosis not present

## 2022-09-17 DIAGNOSIS — Z9181 History of falling: Secondary | ICD-10-CM | POA: Diagnosis not present

## 2022-09-17 DIAGNOSIS — J432 Centrilobular emphysema: Secondary | ICD-10-CM | POA: Diagnosis not present

## 2022-09-17 DIAGNOSIS — I69328 Other speech and language deficits following cerebral infarction: Secondary | ICD-10-CM | POA: Diagnosis not present

## 2022-09-18 DIAGNOSIS — R7303 Prediabetes: Secondary | ICD-10-CM | POA: Diagnosis not present

## 2022-09-18 DIAGNOSIS — Z7982 Long term (current) use of aspirin: Secondary | ICD-10-CM | POA: Diagnosis not present

## 2022-09-18 DIAGNOSIS — Z9181 History of falling: Secondary | ICD-10-CM | POA: Diagnosis not present

## 2022-09-18 DIAGNOSIS — Z7902 Long term (current) use of antithrombotics/antiplatelets: Secondary | ICD-10-CM | POA: Diagnosis not present

## 2022-09-18 DIAGNOSIS — J432 Centrilobular emphysema: Secondary | ICD-10-CM | POA: Diagnosis not present

## 2022-09-18 DIAGNOSIS — I5032 Chronic diastolic (congestive) heart failure: Secondary | ICD-10-CM | POA: Diagnosis not present

## 2022-09-18 DIAGNOSIS — I69328 Other speech and language deficits following cerebral infarction: Secondary | ICD-10-CM | POA: Diagnosis not present

## 2022-09-18 DIAGNOSIS — I69354 Hemiplegia and hemiparesis following cerebral infarction affecting left non-dominant side: Secondary | ICD-10-CM | POA: Diagnosis not present

## 2022-09-18 DIAGNOSIS — E785 Hyperlipidemia, unspecified: Secondary | ICD-10-CM | POA: Diagnosis not present

## 2022-09-24 DIAGNOSIS — I69328 Other speech and language deficits following cerebral infarction: Secondary | ICD-10-CM | POA: Diagnosis not present

## 2022-09-24 DIAGNOSIS — Z7982 Long term (current) use of aspirin: Secondary | ICD-10-CM | POA: Diagnosis not present

## 2022-09-24 DIAGNOSIS — I5032 Chronic diastolic (congestive) heart failure: Secondary | ICD-10-CM | POA: Diagnosis not present

## 2022-09-24 DIAGNOSIS — R7303 Prediabetes: Secondary | ICD-10-CM | POA: Diagnosis not present

## 2022-09-24 DIAGNOSIS — Z7902 Long term (current) use of antithrombotics/antiplatelets: Secondary | ICD-10-CM | POA: Diagnosis not present

## 2022-09-24 DIAGNOSIS — Z9181 History of falling: Secondary | ICD-10-CM | POA: Diagnosis not present

## 2022-09-24 DIAGNOSIS — I69354 Hemiplegia and hemiparesis following cerebral infarction affecting left non-dominant side: Secondary | ICD-10-CM | POA: Diagnosis not present

## 2022-09-24 DIAGNOSIS — J432 Centrilobular emphysema: Secondary | ICD-10-CM | POA: Diagnosis not present

## 2022-09-24 DIAGNOSIS — E785 Hyperlipidemia, unspecified: Secondary | ICD-10-CM | POA: Diagnosis not present

## 2022-09-25 ENCOUNTER — Ambulatory Visit (INDEPENDENT_AMBULATORY_CARE_PROVIDER_SITE_OTHER): Payer: Medicare Other

## 2022-09-25 DIAGNOSIS — E538 Deficiency of other specified B group vitamins: Secondary | ICD-10-CM

## 2022-09-25 MED ORDER — CYANOCOBALAMIN 1000 MCG/ML IJ SOLN
1000.0000 ug | Freq: Once | INTRAMUSCULAR | Status: AC
  Administered 2022-09-25: 1000 ug via INTRAMUSCULAR

## 2022-09-26 DIAGNOSIS — I69354 Hemiplegia and hemiparesis following cerebral infarction affecting left non-dominant side: Secondary | ICD-10-CM | POA: Diagnosis not present

## 2022-09-26 DIAGNOSIS — Z7902 Long term (current) use of antithrombotics/antiplatelets: Secondary | ICD-10-CM | POA: Diagnosis not present

## 2022-09-26 DIAGNOSIS — I5032 Chronic diastolic (congestive) heart failure: Secondary | ICD-10-CM | POA: Diagnosis not present

## 2022-09-26 DIAGNOSIS — I69328 Other speech and language deficits following cerebral infarction: Secondary | ICD-10-CM | POA: Diagnosis not present

## 2022-09-26 DIAGNOSIS — J432 Centrilobular emphysema: Secondary | ICD-10-CM | POA: Diagnosis not present

## 2022-09-26 DIAGNOSIS — Z7982 Long term (current) use of aspirin: Secondary | ICD-10-CM | POA: Diagnosis not present

## 2022-09-26 DIAGNOSIS — R7303 Prediabetes: Secondary | ICD-10-CM | POA: Diagnosis not present

## 2022-09-26 DIAGNOSIS — Z9181 History of falling: Secondary | ICD-10-CM | POA: Diagnosis not present

## 2022-09-26 DIAGNOSIS — E785 Hyperlipidemia, unspecified: Secondary | ICD-10-CM | POA: Diagnosis not present

## 2022-10-01 ENCOUNTER — Telehealth: Payer: Self-pay | Admitting: Nurse Practitioner

## 2022-10-01 NOTE — Telephone Encounter (Signed)
Noted  

## 2022-10-01 NOTE — Telephone Encounter (Signed)
Sharon [name] called to let provider know that Pt refused OT visit for this week/ visit will move to next week / please advise

## 2022-10-05 NOTE — Patient Instructions (Signed)

## 2022-10-07 ENCOUNTER — Encounter: Payer: Self-pay | Admitting: Nurse Practitioner

## 2022-10-07 ENCOUNTER — Ambulatory Visit (INDEPENDENT_AMBULATORY_CARE_PROVIDER_SITE_OTHER): Payer: Medicare Other | Admitting: Nurse Practitioner

## 2022-10-07 VITALS — BP 126/83 | HR 97 | Temp 97.7°F | Ht 69.02 in | Wt 216.2 lb

## 2022-10-07 DIAGNOSIS — Z8673 Personal history of transient ischemic attack (TIA), and cerebral infarction without residual deficits: Secondary | ICD-10-CM

## 2022-10-07 DIAGNOSIS — E1169 Type 2 diabetes mellitus with other specified complication: Secondary | ICD-10-CM

## 2022-10-07 DIAGNOSIS — E785 Hyperlipidemia, unspecified: Secondary | ICD-10-CM | POA: Diagnosis not present

## 2022-10-07 NOTE — Progress Notes (Signed)
BP 126/83   Pulse 97   Temp 97.7 F (36.5 C) (Oral)   Ht 5' 9.02" (1.753 m)   Wt 216 lb 3.2 oz (98.1 kg)   LMP  (LMP Unknown)   SpO2 97%   BMI 31.91 kg/m    Subjective:    Patient ID: Sharon [name], female    DOB: 11-06-1945, 76 y.o.   MRN: [id number]  HPI: Sharon [name] is a 76 y.o. female  Chief Complaint  Patient presents with   CVA    Here for follow up   HYPERLIPIDEMIA & CVA Was admitted to [facility] on 07/02/22 for CVA event, history of CVA in past (2019) -- is having some slurring in speech post CVA.   Currently taking Zetia.  Has not tolerated any statins and with Praluent had redness from injection (swelling and redness to injection site).  Saw neurology for initial visit on 09/11/22 - to decrease ASA to 3 days a week due to bruising and continue Plavix daily.    Currently with ongoing in home therapy -- physical therapy stopped awhile back she reports Jeneen Rinks).  Had a good OT (Tanya) for a period but she is no longer with her -- right hand weakness is improving (is right hand dominant) -- still can not open jars or bottles on own.  Speech as been working well with her -- she passed memory testing with her last check.  Her swallowing is improving with less use of liquid thickener -- spicy foods continue to cause difficulty. Hyperlipidemia status: good compliance Satisfied with current treatment?  yes Side effects:  no Medication compliance: good compliance Past cholesterol meds: statins multiple and Praluent Supplements: none Aspirin:  no The ASCVD Risk score (Arnett DK, et al., 2019) failed to calculate for the following reasons:   The patient has a prior MI or stroke diagnosis Chest pain:  no Coronary artery disease:  no Family history CAD:  yes Family history early CAD:  no   Relevant past medical, surgical, family and social history reviewed and updated as indicated. Interim medical history since our last visit reviewed. Allergies and medications reviewed and  updated.  Review of Systems  Constitutional:  Negative for activity change, appetite change, diaphoresis, fatigue and fever.  Respiratory:  Negative for cough, chest tightness, shortness of breath and wheezing.   Cardiovascular:  Positive for leg swelling (at baseline). Negative for chest pain and palpitations.  Gastrointestinal: Negative.   Endocrine: Negative for polydipsia, polyphagia and polyuria.  Neurological:  Positive for speech difficulty and weakness (right hand). Negative for dizziness, tremors, facial asymmetry, light-headedness, numbness and headaches.  Psychiatric/Behavioral: Negative.     Per HPI unless specifically indicated above     Objective:    BP 126/83   Pulse 97   Temp 97.7 F (36.5 C) (Oral)   Ht 5' 9.02" (1.753 m)   Wt 216 lb 3.2 oz (98.1 kg)   LMP  (LMP Unknown)   SpO2 97%   BMI 31.91 kg/m   Wt Readings from Last 3 Encounters:  10/07/22 216 lb 3.2 oz (98.1 kg)  09/02/22 221 lb (100.2 kg)  08/26/22 220 lb (99.8 kg)    Physical Exam Vitals and nursing note reviewed.  Constitutional:      General: She is awake. She is not in acute distress.    Appearance: She is well-developed and well-groomed. She is obese. She is not ill-appearing or toxic-appearing.  HENT:     Head: Normocephalic.  Right Ear: Hearing normal.     Left Ear: Hearing normal.  Eyes:     General: Lids are normal.        Right eye: No discharge.        Left eye: No discharge.     Conjunctiva/sclera: Conjunctivae normal.  Cardiovascular:     Rate and Rhythm: Normal rate and regular rhythm.     Pulses:          Dorsalis pedis pulses are 2+ on the right side and 2+ on the left side.       Posterior tibial pulses are 2+ on the right side and 2+ on the left side.     Heart sounds: No murmur heard.    No gallop.  Pulmonary:     Effort: Pulmonary effort is normal. No accessory muscle usage or respiratory distress.     Breath sounds: Normal breath sounds.  Musculoskeletal:      Right hand: No swelling. Normal range of motion. Decreased strength of thumb/finger opposition.     Left hand: Normal.     Cervical back: Normal range of motion.     Right lower leg: 2+ Edema present.     Left lower leg: 2+ Edema present.  Skin:    Findings: Bruising (mild noted to upper extremities.) present.     Comments: Mild erythema to bilateral shins extending from ankle to just below knee L>R leg.  Baseline edema present.  No warmth or drainage.  Skin intact.  Neurological:     Mental Status: She is alert and oriented to person, place, and time.     Cranial Nerves: Cranial nerves 2-12 are intact.     Motor: Weakness (right hand weakness) present.     Coordination: Coordination is intact.     Gait: Gait is intact.     Deep Tendon Reflexes: Reflexes are normal and symmetric.     Reflex Scores:      Brachioradialis reflexes are 2+ on the right side and 2+ on the left side.      Patellar reflexes are 2+ on the right side and 2+ on the left side.    Comments: Occasional slurred speech noted.  Psychiatric:        Attention and Perception: Attention normal.        Mood and Affect: Mood normal.        Behavior: Behavior normal. Behavior is cooperative.        Thought Content: Thought content normal.        Judgment: Judgment normal.     Results for orders placed or performed in visit on 08/26/22  Microscopic Examination   Urine  Result Value Ref Range   WBC, UA 0-5 0 - 5 /hpf   RBC, Urine 0-2 0 - 2 /hpf   Epithelial Cells (non renal) 0-10 0 - 10 /hpf   Bacteria, UA None seen None seen/Few  Urinalysis, Routine w reflex microscopic  Result Value Ref Range   Specific Gravity, UA 1.015 1.005 - 1.030   pH, UA 5.0 5.0 - 7.5   Color, UA Yellow Yellow   Appearance Ur Clear Clear   Leukocytes,UA Negative Negative   Protein,UA Negative Negative/Trace   Glucose, UA Negative Negative   Ketones, UA Negative Negative   RBC, UA Trace (A) Negative   Bilirubin, UA Negative Negative    Urobilinogen, Ur 0.2 0.2 - 1.0 mg/dL   Nitrite, UA Negative Negative   Microscopic Examination See below:   Comprehensive metabolic  panel  Result Value Ref Range   Glucose 123 (H) 70 - 99 mg/dL   BUN 14 8 - 27 mg/dL   Creatinine, Ser 0.96 0.57 - 1.00 mg/dL   eGFR 61 >59 mL/min/1.73   BUN/Creatinine Ratio 15 12 - 28   Sodium 142 134 - 144 mmol/L   Potassium 4.8 3.5 - 5.2 mmol/L   Chloride 103 96 - 106 mmol/L   CO2 23 20 - 29 mmol/L   Calcium 9.2 8.7 - 10.3 mg/dL   Total Protein 6.6 6.0 - 8.5 g/dL   Albumin 4.2 3.8 - 4.8 g/dL   Globulin, Total 2.4 1.5 - 4.5 g/dL   Albumin/Globulin Ratio 1.8 1.2 - 2.2   Bilirubin Total 0.5 0.0 - 1.2 mg/dL   Alkaline Phosphatase 89 44 - 121 IU/L   AST 19 0 - 40 IU/L   ALT 19 0 - 32 IU/L  Lipid Panel w/o Chol/HDL Ratio  Result Value Ref Range   Cholesterol, Total 175 100 - 199 mg/dL   Triglycerides 74 0 - 149 mg/dL   HDL 62 >39 mg/dL   VLDL Cholesterol Cal 14 5 - 40 mg/dL   LDL Chol Calc (NIH) 99 0 - 99 mg/dL   *Note: Due to a large number of results and/or encounters for the requested time period, some results have not been displayed. A complete set of results can be found in Results Review.      Assessment & Plan:   Problem List Items Addressed This Visit       Endocrine   Hyperlipidemia associated with type 2 diabetes mellitus ([place])    Chronic, ongoing. Continue Zetia at this time as is tolerating.  Did not tolerate Rosuvastatin 20 MG three days a week or any other statin.  Have tried multiple statins with ongoing myalgias with use.  Praluent and Zetia also caused discomfort.  Check lipid panel and CMP next visit with goal LDL<70.  CCM collaboration continues.  May need referral to lipid clinic in future if unable to get LDL <70.      Relevant Medications   aspirin 81 MG chewable tablet   Other Relevant Orders   Ambulatory referral to [place]     Other   History of CVA (cerebrovascular accident) - Primary    History of in 2019,  most recent even 2/59/56 = atheroembolic causing CVA (cerebral vascular accident) of right MCA distribution.  At this time continue Zetia, has not tolerated statins in past, even on decreased pattern.  Check labs next visit.  Discussed neuro goals: A1c <6.5% (current 6.4%), BP <130/80, and LDL <70. Continue to collaborate with neurology.  Continue at home therapy, new order to extend speech and OT.      Relevant Orders   Ambulatory referral to [place]     Follow up plan: Return in about 3 months (around 01/06/2023) for HTN/HLD, COPD, PVD, T2DM.

## 2022-10-07 NOTE — Assessment & Plan Note (Signed)
History of in 2019, most recent even 2/50/87 = atheroembolic causing CVA (cerebral vascular accident) of right MCA distribution.  At this time continue Zetia, has not tolerated statins in past, even on decreased pattern.  Check labs next visit.  Discussed neuro goals: A1c <6.5% (current 6.4%), BP <130/80, and LDL <70. Continue to collaborate with neurology.  Continue at home therapy, new order to extend speech and OT.

## 2022-10-07 NOTE — Assessment & Plan Note (Signed)
Chronic, ongoing. Continue Zetia at this time as is tolerating.  Did not tolerate Rosuvastatin 20 MG three days a week or any other statin.  Have tried multiple statins with ongoing myalgias with use.  Praluent and Zetia also caused discomfort.  Check lipid panel and CMP next visit with goal LDL<70.  CCM collaboration continues.  May need referral to lipid clinic in future if unable to get LDL <70.

## 2022-10-08 ENCOUNTER — Encounter: Payer: Self-pay | Admitting: Dermatology

## 2022-10-08 ENCOUNTER — Ambulatory Visit (INDEPENDENT_AMBULATORY_CARE_PROVIDER_SITE_OTHER): Payer: Medicare Other | Admitting: Dermatology

## 2022-10-08 VITALS — BP 115/66 | HR 99

## 2022-10-08 DIAGNOSIS — D1801 Hemangioma of skin and subcutaneous tissue: Secondary | ICD-10-CM

## 2022-10-08 DIAGNOSIS — I872 Venous insufficiency (chronic) (peripheral): Secondary | ICD-10-CM

## 2022-10-08 DIAGNOSIS — L720 Epidermal cyst: Secondary | ICD-10-CM

## 2022-10-08 MED ORDER — EUCRISA 2 % EX OINT: 1.0000 "application " | TOPICAL_OINTMENT | Freq: Every day | CUTANEOUS | 2 refills | Status: DC

## 2022-10-08 NOTE — Patient Instructions (Addendum)
Stasis in the legs causes chronic leg swelling, which may result in itchy or painful rashes, skin discoloration, skin texture changes, and sometimes ulceration.  Recommend daily graduated compression hose/stockings- easiest to put on first thing in morning, remove at bedtime.  Elevate legs as much as possible. Avoid salt/sodium rich foods.   Continue Eucrisa 2% ointment   apply topically to affected areas qd/bid    Cont Juxtalite compression garment daily Cont Lymphedema pump several times a day Cont CeraVe Itch-Relief (Red) and/or CeraVe SA (Green) as needed       Due to recent changes in healthcare laws, you may see results of your pathology and/or laboratory studies on MyChart before the doctors have had a chance to review them. We understand that in some cases there may be results that are confusing or concerning to you. Please understand that not all results are received at the same time and often the doctors may need to interpret multiple results in order to provide you with the best plan of care or course of treatment. Therefore, we ask that you please give [place] 2 business days to thoroughly review all your results before contacting the office for clarification. Should we see a critical lab result, you will be contacted sooner.   If You Need Anything After Your Visit  If you have any questions or concerns for your doctor, please call our main line at [phone] and press option 4 to reach your doctor's medical assistant. If no one answers, please leave a voicemail as directed and we will return your call as soon as possible. Messages left after 4 pm will be answered the following business day.   You may also send [place] a message via [place]. We typically respond to MyChart messages within 1-2 business days.  For prescription refills, please ask your pharmacy to contact our office. Our fax number is [phone].  If you have an urgent issue when the clinic is closed that cannot wait until  the next business day, you can page your doctor at the number below.    Please note that while we do our best to be available for urgent issues outside of office hours, we are not available 24/7.   If you have an urgent issue and are unable to reach [place], you may choose to seek medical care at your doctor's office, retail clinic, urgent care center, or emergency room.  If you have a medical emergency, please immediately call 911 or go to the emergency department.  Pager Numbers  - Dr. Nehemiah Massed: [phone]  - Dr. Laurence Ferrari: [phone]  - Dr. Nicole Kindred: [phone]  In the event of inclement weather, please call our main line at [phone] for an update on the status of any delays or closures.  Dermatology Medication Tips: Please keep the boxes that topical medications come in in order to help keep track of the instructions about where and how to use these. Pharmacies typically print the medication instructions only on the boxes and not directly on the medication tubes.   If your medication is too expensive, please contact our office at [phone] option 4 or send [place] a message through [place].   We are unable to tell what your co-pay for medications will be in advance as this is different depending on your insurance coverage. However, we may be able to find a substitute medication at lower cost or fill out paperwork to get insurance to cover a needed medication.   If a prior authorization is required to get  your medication covered by your insurance company, please allow [place] 1-2 business days to complete this process.  Drug prices often vary depending on where the prescription is filled and some pharmacies may offer cheaper prices.  The website www.goodrx.com contains coupons for medications through different pharmacies. The prices here do not account for what the cost may be with help from insurance (it may be cheaper with your insurance), but the website can give you the price if you did  not use any insurance.  - You can print the associated coupon and take it with your prescription to the pharmacy.  - You may also stop by our office during regular business hours and pick up a GoodRx coupon card.  - If you need your prescription sent electronically to a different pharmacy, notify our office through [facility] or by phone at [phone] option 4.     Si Usted Necesita Algo Despus de Su Visita  Tambin puede enviarnos un mensaje a travs de Pharmacist, community. Por lo general respondemos a los mensajes de MyChart en el transcurso de 1 a 2 das hbiles.  Para renovar recetas, por favor pida a su farmacia que se ponga en contacto con nuestra oficina. Harland Dingwall de fax es [place] [phone].  Si tiene un asunto urgente cuando la clnica est cerrada y que no puede esperar hasta el siguiente da hbil, puede llamar/localizar a su doctor(a) al nmero que aparece a continuacin.   Por favor, tenga en cuenta que aunque hacemos todo lo posible para estar disponibles para asuntos urgentes fuera del horario de [place], no estamos disponibles las 24 horas del da, los 7 das de la [place].   Si tiene un problema urgente y no puede comunicarse con nosotros, puede optar por buscar atencin mdica  en el consultorio de su doctor(a), en una clnica privada, en un centro de atencin urgente o en una sala de emergencias.  Si tiene Engineering geologist, por favor llame inmediatamente al 911 o vaya a la sala de emergencias.  Nmeros de bper  - Dr. Nehemiah Massed: [phone]  - Dra. Moye: [phone]  - Dra. Nicole Kindred: [phone]  En caso de inclemencias del [place], por favor llame a Johnsie Kindred principal al [phone] para una actualizacin sobre el [place] de cualquier retraso o cierre.  Consejos para la medicacin en dermatologa: Por favor, guarde las cajas en las que vienen los medicamentos de uso tpico para ayudarle a seguir las instrucciones sobre dnde y cmo usarlos. Las  farmacias generalmente imprimen las instrucciones del medicamento slo en las cajas y no directamente en los tubos del [place].   Si su medicamento es muy caro, por favor, pngase en contacto con Zigmund Daniel llamando al [phone] y presione la opcin 4 o envenos un mensaje a travs de Pharmacist, community.   No podemos decirle cul ser su copago por los medicamentos por adelantado ya que esto es diferente dependiendo de la cobertura de su seguro. Sin embargo, es posible que podamos encontrar un medicamento sustituto a Electrical engineer un formulario para que el seguro cubra el medicamento que se considera necesario.   Si se requiere una autorizacin previa para que su compaa de seguros [place] su medicamento, por favor permtanos de 1 a 2 das hbiles para completar este proceso.  Los precios de los medicamentos varan con frecuencia dependiendo del Environmental consultant de dnde se surte la receta y alguna farmacias pueden ofrecer precios ms baratos.  El sitio web www.goodrx.com tiene cupones para medicamentos de Airline pilot. Los  precios aqu no tienen en cuenta lo que podra costar con la ayuda del seguro (puede ser ms barato con su seguro), pero el sitio web puede darle el precio si no utiliz Research scientist (physical sciences).  - Puede imprimir el cupn correspondiente y llevarlo con su receta a la farmacia.  - Tambin puede pasar por nuestra oficina durante el horario de atencin regular y Charity fundraiser una tarjeta de cupones de GoodRx.  - Si necesita que su receta se enve electrnicamente a una farmacia diferente, informe a nuestra oficina a travs de MyChart de [place] o por telfono llamando al [phone] y presione la opcin 4.

## 2022-10-08 NOTE — Progress Notes (Signed)
   Follow-Up Visit   Subjective  Sharon [name] is a 76 y.o. female who presents for the following: Rash (Recheck stasis dermatitis. B/L legs. Wears compression stockings. Uses Eucrisa ointment at bedtime as needed, uses lymphedema pump. Reports has been doing well. Needed end of year follow up. Hx of 2 strokes in September ).  Has used desoximetasone 0.05% cream in past (not allergic to it) for flares.    The following portions of the chart were reviewed this encounter and updated as appropriate:      Review of Systems: No other skin or systemic complaints except as noted in HPI or Assessment and Plan.   Objective  Well appearing patient in no apparent distress; mood and affect are within normal limits.  A focused examination was performed including lower extremities, including the legs, feet, toes, and toenails and face. Relevant physical exam findings are noted in the Assessment and Plan.  B/L legs Erythema at lower legs with woody pitting edema  left alar crease, right superior nasolabial 0.6 cm  yellow white firm papule left alar crease  1.3 cm firm subq nodule with overlying erythema and telangiectasias at right superior nasolabial (see photo).  Pt states has been present for years and will sometimes drain stinky white stuff        Assessment & Plan  Stasis dermatitis of both legs B/L legs  Chronic and persistent condition with duration or expected duration over one year. Condition is symptomatic/ bothersome to patient.  Improving but not to goal.     Patient reports skin allergy to elidel, lanolin, tmc, and is unsure of other allergies. Have recommended patch testing in past, in order to determine other allergies if she continues to have problems with skin. She states she is doing better now.   Stasis in the legs causes chronic leg swelling, which may result in itchy or painful rashes, skin discoloration, skin texture changes, and sometimes ulceration.  Recommend daily  graduated compression hose/stockings- easiest to put on first thing in morning, remove at bedtime.  Elevate legs as much as possible. Avoid salt/sodium rich foods.   Continue Eucrisa 2% ointment  (samples given) - apply topically to affected areas qd/bid    Cont Juxtalite compression garment qd Cont Lymphedema pump several times a day Cont CeraVe Itch-Relief and/or CeraVe SA prn         Venous stasis dermatitis of left lower extremity  Related Medications Crisaborole (EUCRISA) 2 % OINT Apply 1 application  topically daily. Use for itching and inflammation at left leg  Epidermal inclusion cyst (2) left alar crease; right superior nasolabial  Benign-appearing. Exam most consistent with an epidermal inclusion cyst. Discussed that a cyst is a benign growth that can grow over time and sometimes get irritated or inflamed. Recommend observation if it is not bothersome. Discussed option of surgical excision to remove it if it is growing, symptomatic, or other changes noted. Please call for new or changing lesions so they can be evaluated.     Hemangiomas - Red papules chest - Discussed benign nature - Observe - Call for any changes   Return for Stasis dermatitis follow up in 3-4 months.  I, Emelia Salisbury, CMA, am acting as scribe for Brendolyn Patty, MD.  Documentation: I have reviewed the above documentation for accuracy and completeness, and I agree with the above.  Brendolyn Patty MD

## 2022-10-09 DIAGNOSIS — R7303 Prediabetes: Secondary | ICD-10-CM | POA: Diagnosis not present

## 2022-10-09 DIAGNOSIS — Z9181 History of falling: Secondary | ICD-10-CM | POA: Diagnosis not present

## 2022-10-09 DIAGNOSIS — Z7982 Long term (current) use of aspirin: Secondary | ICD-10-CM | POA: Diagnosis not present

## 2022-10-09 DIAGNOSIS — J432 Centrilobular emphysema: Secondary | ICD-10-CM | POA: Diagnosis not present

## 2022-10-09 DIAGNOSIS — I69354 Hemiplegia and hemiparesis following cerebral infarction affecting left non-dominant side: Secondary | ICD-10-CM | POA: Diagnosis not present

## 2022-10-09 DIAGNOSIS — Z7902 Long term (current) use of antithrombotics/antiplatelets: Secondary | ICD-10-CM | POA: Diagnosis not present

## 2022-10-09 DIAGNOSIS — I69328 Other speech and language deficits following cerebral infarction: Secondary | ICD-10-CM | POA: Diagnosis not present

## 2022-10-09 DIAGNOSIS — I5032 Chronic diastolic (congestive) heart failure: Secondary | ICD-10-CM | POA: Diagnosis not present

## 2022-10-09 DIAGNOSIS — E785 Hyperlipidemia, unspecified: Secondary | ICD-10-CM | POA: Diagnosis not present

## 2022-10-10 ENCOUNTER — Encounter: Payer: Self-pay | Admitting: Nurse Practitioner

## 2022-10-10 NOTE — Telephone Encounter (Signed)
Sharon [name] from [facility] states wont be able to extend home health speech Therapy because doesnt have room but patient has one more visit before releasing

## 2022-10-15 ENCOUNTER — Emergency Department
Admission: EM | Admit: 2022-10-15 | Discharge: 2022-10-15 | Disposition: A | Discharge status: Home / Self Care | Payer: Medicare Other | Attending: Emergency Medicine | Admitting: Emergency Medicine

## 2022-10-15 ENCOUNTER — Emergency Department: Payer: Medicare Other

## 2022-10-15 ENCOUNTER — Other Ambulatory Visit: Payer: Self-pay

## 2022-10-15 DIAGNOSIS — I69351 Hemiplegia and hemiparesis following cerebral infarction affecting right dominant side: Secondary | ICD-10-CM | POA: Insufficient documentation

## 2022-10-15 DIAGNOSIS — R531 Weakness: Secondary | ICD-10-CM | POA: Diagnosis present

## 2022-10-15 DIAGNOSIS — R42 Dizziness and giddiness: Secondary | ICD-10-CM | POA: Diagnosis not present

## 2022-10-15 DIAGNOSIS — I639 Cerebral infarction, unspecified: Secondary | ICD-10-CM | POA: Diagnosis not present

## 2022-10-15 DIAGNOSIS — Z8673 Personal history of transient ischemic attack (TIA), and cerebral infarction without residual deficits: Secondary | ICD-10-CM | POA: Insufficient documentation

## 2022-10-15 DIAGNOSIS — R404 Transient alteration of awareness: Secondary | ICD-10-CM | POA: Diagnosis not present

## 2022-10-15 DIAGNOSIS — J449 Chronic obstructive pulmonary disease, unspecified: Secondary | ICD-10-CM | POA: Insufficient documentation

## 2022-10-15 DIAGNOSIS — Z743 Need for continuous supervision: Secondary | ICD-10-CM | POA: Diagnosis not present

## 2022-10-15 DIAGNOSIS — R6889 Other general symptoms and signs: Secondary | ICD-10-CM | POA: Diagnosis not present

## 2022-10-15 LAB — CBC
HCT: 47.4 % — ABNORMAL HIGH (ref 36.0–46.0)
Hemoglobin: 15.2 g/dL — ABNORMAL HIGH (ref 12.0–15.0)
MCH: 29.3 pg (ref 26.0–34.0)
MCHC: 32.1 g/dL (ref 30.0–36.0)
MCV: 91.3 fL (ref 80.0–100.0)
Platelets: 203 10*3/uL (ref 150–400)
RBC: 5.19 MIL/uL — ABNORMAL HIGH (ref 3.87–5.11)
RDW: 13.1 % (ref 11.5–15.5)
WBC: 6.6 10*3/uL (ref 4.0–10.5)
nRBC: 0 % (ref 0.0–0.2)

## 2022-10-15 LAB — APTT: aPTT: 25 seconds (ref 24–36)

## 2022-10-15 LAB — ETHANOL: Alcohol, Ethyl (B): <10 mg/dL (ref <10)

## 2022-10-15 LAB — COMPREHENSIVE METABOLIC PANEL
ALT: 26 U/L (ref 0–44)
AST: 27 U/L (ref 15–41)
Albumin: 4.2 g/dL (ref 3.5–5.0)
Alkaline Phosphatase: 80 U/L (ref 38–126)
Anion gap: 9 (ref 5–15)
BUN: 16 mg/dL (ref 8–23)
CO2: 24 mmol/L (ref 22–32)
Calcium: 8.9 mg/dL (ref 8.9–10.3)
Chloride: 105 mmol/L (ref 98–111)
Creatinine, Ser: 0.9 mg/dL (ref 0.44–1.00)
GFR, Estimated: >60 mL/min (ref >60)
Glucose, Bld: 136 mg/dL — ABNORMAL HIGH (ref 70–99)
Potassium: 4.3 mmol/L (ref 3.5–5.1)
Sodium: 138 mmol/L (ref 135–145)
Total Bilirubin: 1 mg/dL (ref 0.3–1.2)
Total Protein: 7.2 g/dL (ref 6.5–8.1)

## 2022-10-15 LAB — DIFFERENTIAL
Abs Immature Granulocytes: 0.02 10*3/uL (ref 0.00–0.07)
Basophils Absolute: 0 10*3/uL (ref 0.0–0.1)
Basophils Relative: 0 %
Eosinophils Absolute: 0 10*3/uL (ref 0.0–0.5)
Eosinophils Relative: 1 %
Immature Granulocytes: 0 %
Lymphocytes Relative: 17 %
Lymphs Abs: 1.1 10*3/uL (ref 0.7–4.0)
Monocytes Absolute: 0.5 10*3/uL (ref 0.1–1.0)
Monocytes Relative: 7 %
Neutro Abs: 5 10*3/uL (ref 1.7–7.7)
Neutrophils Relative %: 75 %

## 2022-10-15 LAB — URINALYSIS, COMPLETE (UACMP) WITH MICROSCOPIC
Bacteria, UA: NONE SEEN
Bilirubin Urine: NEGATIVE
Glucose, UA: NEGATIVE mg/dL
Ketones, ur: NEGATIVE mg/dL
Leukocytes,Ua: NEGATIVE
Nitrite: NEGATIVE
Protein, ur: NEGATIVE mg/dL
Specific Gravity, Urine: 1.011 (ref 1.005–1.030)
Squamous Epithelial / HPF: NONE SEEN /HPF (ref 0–5)
pH: 5 (ref 5.0–8.0)

## 2022-10-15 LAB — PROTIME-INR
INR: 1 (ref 0.8–1.2)
Prothrombin Time: 13.1 seconds (ref 11.4–15.2)

## 2022-10-15 LAB — TROPONIN I (HIGH SENSITIVITY): Troponin I (High Sensitivity): 25 ng/L — ABNORMAL HIGH (ref <18)

## 2022-10-15 MED ORDER — SODIUM CHLORIDE 0.9 % IV BOLUS
1000.0000 mL | Freq: Once | INTRAVENOUS | Status: AC
  Administered 2022-10-15: 1000 mL via INTRAVENOUS

## 2022-10-15 MED ORDER — SODIUM CHLORIDE 0.9% FLUSH: 3.0000 mL | Freq: Once | INTRAVENOUS | Status: DC

## 2022-10-15 MED ORDER — LORAZEPAM 2 MG/ML IJ SOLN
1.0000 mg | Freq: Once | INTRAMUSCULAR | Status: AC
  Administered 2022-10-15: 1 mg via INTRAVENOUS
  Filled 2022-10-15: qty 1

## 2022-10-15 NOTE — ED Triage Notes (Signed)
At home this morning, felt dizzy and weak. States continues to feel "swimmy headed and weak".  Hx of stroke in September, with residual right sided weakness. States symptoms are similar to stroke symptoms in September.  Per patient bp is elevated this morning.  AAOx3. MAE equally and strong.  Speech clear.

## 2022-10-15 NOTE — ED Provider Notes (Signed)
[facility] Provider Note    Event Date/Time   First MD Initiated Contact with Patient 10/15/22 1522     (approximate)  History   Chief Complaint: Weakness  HPI  Sharon [name] is a 77 y.o. female with a past medical history anxiety, arthritis, COPD, prior CVA in September who presents to the emergency department for generalized dizziness.  According to the patient for the past 12 to 24 hours she has been feeling off balance feeling weak and dizzy at times.  Patient states her symptoms are very similar to the symptoms she experienced when she had a stroke back in September 2023.  Patient states she has residual right-sided weakness.  Has a home health aide that comes and sees her 3 times a week including this morning.  Patient states this morning she was having some trouble ambulating feeling lightheaded like she could be dehydrated.  However the patient states her symptoms are similar to the symptoms she had when she had a CVA in September they recommended she come to the emergency department for evaluation.  Here the patient appears well, states no current dizziness.  Denies any increased weakness or numbness does have residual right-sided weakness.  Denies any recent illnesses fever cough congestion vomiting or diarrhea.  No urinary symptoms.  Physical Exam   Triage Vital Signs: ED Triage Vitals  Enc Vitals Group     BP 10/15/22 1102 119/72     Pulse Rate 10/15/22 1102 97     Resp 10/15/22 1102 16     Temp 10/15/22 1102 98.1 F (36.7 C)     Temp Source 10/15/22 1102 Oral     SpO2 10/15/22 1102 96 %     Weight 10/15/22 1059 216 lb 0.8 oz (98 kg)     Height 10/15/22 1059 '5\' 9"'$  (1.753 m)     Head Circumference --      Peak Flow --      Pain Score 10/15/22 1059 0     Pain Loc --      Pain Edu? --      Excl. in [place]? --     Most recent vital signs: Vitals:   10/15/22 1102  BP: 119/72  Pulse: 97  Resp: 16  Temp: 98.1 F (36.7 C)  SpO2: 96%     General: Awake, no distress.  CV:  Good peripheral perfusion.  Regular rate and rhythm  Resp:  Normal effort.  Equal breath sounds bilaterally.  Abd:  No distention.  Soft, nontender.  No rebound or guarding. Other:  Patient has somewhat diminished grip strength the right upper extremity she states is chronic.  No other acute findings on my exam.   ED Results / Procedures / Treatments   EKG  EKG viewed and interpreted by myself shows a normal sinus rhythm at 92 bpm with a narrow QRS, normal axis, normal intervals, no concerning ST changes.  RADIOLOGY  I have reviewed and interpreted the CT head images.  No large mass or bleed seen on my evaluation. Radiology has read the CT scan is negative for hemorrhage or acute infarct.  Positive for chronic infarct.   MEDICATIONS ORDERED IN ED: Medications  sodium chloride flush (NS) 0.9 % injection 3 mL (3 mLs Intravenous Not Given 10/15/22 1510)  sodium chloride 0.9 % bolus 1,000 mL (has no administration in time range)  LORazepam (ATIVAN) injection 1 mg (has no administration in time range)     IMPRESSION / MDM / ASSESSMENT  AND PLAN / ED COURSE  I reviewed the triage vital signs and the nursing notes.  Patient's presentation is most consistent with acute presentation with potential threat to life or bodily function.  Patient presents emergency department for dizziness, lightheadedness feeling off balance.  Patient states symptoms she has had previously with her stroke back in September but also previously when she feels dehydrated.  Patient's workup is thus far reassuring CT shows old infarct but new infarct, chemistry shows no concerning findings with normal anion gap, CBC is largely within normal limits INR is normal.  EKG shows no concerning findings.  Patient has residual right-sided weakness on my exam but denies any new changes.  We will place an IV, IV hydrate the patient.  We will obtain an MRI to evaluate for possible new CVA.   Patient will require Ativan due to claustrophobia for her MRI.  The patient's MRI is negative anticipate discharge home.  Patient agreeable to this plan as well.  MRI has resulted negative for acute infarct.  Patient's labs overall reassuring, slight troponin elevation but not significant.  Patient is feeling better after IV fluids.  Given the patient's reassuring MRI I believe the patient is safe for discharge home with outpatient follow-up.  Patient is agreeable this plan as well.  FINAL CLINICAL IMPRESSION(S) / ED DIAGNOSES   Dizziness    Note:  This document was prepared using Dragon voice recognition software and may include unintentional dictation errors.   Harvest Dark, MD 10/15/22 [phone]

## 2022-10-15 NOTE — ED Provider Triage Note (Signed)
Emergency Medicine Provider Triage Evaluation Note  Sharon [name] , a 77 y.o. female  was evaluated in triage.  Pt complains of feels dizzy and weak.  Had CVA in 9/23.  States that she has only a little residual on the right but feels normal.   Review of Systems  Positive:  Negative:   Physical Exam  Ht '5\' 9"'$  (1.753 m)   Wt 98 kg   LMP  (LMP Unknown)   BMI 31.91 kg/m  Gen:   Awake, no distress  Able to talk in complete sentences.  Resp:  Normal effort  Lungs clear.  MSK:   Moves extremities without difficulty  Other:  Cranial nerves grossly intact.  No upper extremity weakness noted.  Speech normal.    Medical Decision Making  Medically screening exam initiated at 11:00 AM.  Appropriate orders placed.  [name] was informed that the remainder of the evaluation will be completed by another provider, this initial triage assessment does not replace that evaluation, and the importance of remaining in the ED until their evaluation is complete.     Johnn Hai, PA-C 10/15/22 1104

## 2022-10-15 NOTE — ED Notes (Signed)
X2 unsuccessful IV attempts by this RN. Pt wants IV in L arm, not in R arm or hands.

## 2022-10-15 NOTE — ED Notes (Signed)
First Nurse Note: Pt to ED via [facility] from home for dizziness and lightheadedness that started around 0930. Pt denies any other symptoms. Pt reports that dizziness is getting some better since EMS arrival. Pt BP 170/92, with no hx/o same. Pt has hx/o 2 previous CVAs

## 2022-10-20 ENCOUNTER — Telehealth: Payer: Self-pay

## 2022-10-20 NOTE — Telephone Encounter (Signed)
        Patient  visited [place] on 1/3     Telephone encounter attempt :   1st A HIPAA compliant voice message was left requesting a return call.  Instructed patient to call back     [place], [place] Management  [phone] [address], [place], [place] [address] Phone: [phone] Email: [email or web address]

## 2022-10-21 ENCOUNTER — Telehealth: Payer: Self-pay

## 2022-10-21 NOTE — Telephone Encounter (Signed)
        Patient  visited [place] on 1/3     Telephone encounter attempt :  2nd  A HIPAA compliant voice message was left requesting a return call.  Instructed patient to call back .    [place], Care Management  [phone] [address], [place], [place] [address] Phone: [phone] Email: [email or web address]

## 2022-10-23 DIAGNOSIS — Z9181 History of falling: Secondary | ICD-10-CM | POA: Diagnosis not present

## 2022-10-23 DIAGNOSIS — I5032 Chronic diastolic (congestive) heart failure: Secondary | ICD-10-CM | POA: Diagnosis not present

## 2022-10-23 DIAGNOSIS — J432 Centrilobular emphysema: Secondary | ICD-10-CM | POA: Diagnosis not present

## 2022-10-23 DIAGNOSIS — Z7982 Long term (current) use of aspirin: Secondary | ICD-10-CM | POA: Diagnosis not present

## 2022-10-23 DIAGNOSIS — E785 Hyperlipidemia, unspecified: Secondary | ICD-10-CM | POA: Diagnosis not present

## 2022-10-23 DIAGNOSIS — Z7902 Long term (current) use of antithrombotics/antiplatelets: Secondary | ICD-10-CM | POA: Diagnosis not present

## 2022-10-23 DIAGNOSIS — I69328 Other speech and language deficits following cerebral infarction: Secondary | ICD-10-CM | POA: Diagnosis not present

## 2022-10-23 DIAGNOSIS — R7303 Prediabetes: Secondary | ICD-10-CM | POA: Diagnosis not present

## 2022-10-23 DIAGNOSIS — I69354 Hemiplegia and hemiparesis following cerebral infarction affecting left non-dominant side: Secondary | ICD-10-CM | POA: Diagnosis not present

## 2022-10-28 ENCOUNTER — Encounter: Payer: Self-pay | Admitting: Pharmacist

## 2022-10-28 ENCOUNTER — Ambulatory Visit (INDEPENDENT_AMBULATORY_CARE_PROVIDER_SITE_OTHER): Payer: Medicare Other

## 2022-10-28 DIAGNOSIS — E538 Deficiency of other specified B group vitamins: Secondary | ICD-10-CM

## 2022-10-28 MED ORDER — CYANOCOBALAMIN 1000 MCG/ML IJ SOLN
1000.0000 ug | Freq: Once | INTRAMUSCULAR | Status: AC
  Administered 2022-10-28: 1000 ug via INTRAMUSCULAR

## 2022-11-04 ENCOUNTER — Encounter: Payer: Self-pay | Admitting: Nurse Practitioner

## 2022-11-05 NOTE — Telephone Encounter (Signed)
Prescription for glucose meter and supplies placed in folder for signature

## 2022-11-09 ENCOUNTER — Other Ambulatory Visit: Payer: Self-pay | Admitting: Nurse Practitioner

## 2022-11-10 ENCOUNTER — Telehealth: Payer: Self-pay

## 2022-11-10 NOTE — Telephone Encounter (Deleted)
Paperwork faxed back to [employer] in [place]

## 2022-11-10 NOTE — Telephone Encounter (Signed)
Paperwork faxed back to [employer]

## 2022-11-10 NOTE — Telephone Encounter (Signed)
Requested medication (s) are due for refill today -no  Requested medication (s) are on the active medication list -yes  Future visit scheduled -yes  Last refill: 12/31/21 10.7g 11RF  Notes to clinic: medication not assigned protocol- provider review   Requested Prescriptions  Pending Prescriptions Disp [name] [Pharmacy Med Name: BREZTRI AERO SPHERE INHALER 120 INH] 10.7 g 11    Sig: INHALE 2 PUFFS BY MOUTH TWICE DAILY AS DIRECTED     Off-Protocol Failed - 11/09/2022  1:21 AM      Failed - Medication not assigned to a protocol, review manually.      Passed - Valid encounter within last 12 months    Recent Outpatient Visits           1 month ago History of CVA (cerebrovascular accident)   [place], [place] T, NP   2 months ago Hyperlipidemia associated with type 2 diabetes mellitus ([name])   [place], [place] T, NP   3 months ago Type 2 diabetes mellitus with morbid obesity ([name])   [place], [place] T, NP   4 months ago Acute cystitis without hematuria   [place] [facility] Mecum, Erin E, PA-C   5 months ago Tick bite of right lower leg, initial encounter   [place], PA-C       Future Appointments             In 1 month [name], [name], NP [place], [place]   In 3 months Brendolyn Patty, MD [place] 160-9-4.8 MCG/ACT AERO [Pharmacy Med Name: BREZTRI AERO SPHERE INHALER 120 INH] 10.7 g 11    Sig: INHALE 2 PUFFS BY MOUTH TWICE DAILY AS DIRECTED     Off-Protocol Failed - 11/09/2022  1:21 AM      Failed - Medication not assigned to a protocol, review manually.      Passed - Valid encounter within last 12 months    Recent  Outpatient Visits           1 month ago History of CVA (cerebrovascular accident)   [place], [place] T, NP   2 months ago Hyperlipidemia associated with type 2 diabetes mellitus ([employer])   [place], [place] T, NP   3 months ago Type 2 diabetes mellitus with morbid obesity ([place])   [place], [place] T, NP   4 months ago Acute cystitis without hematuria   [place], PA-C   5 months ago Tick bite of right lower leg, initial encounter   [place], PA-C       Future Appointments             In 1 month [name], [name], NP [place], [place]   In 3 months Brendolyn Patty, MD [place]

## 2022-11-10 NOTE — Telephone Encounter (Signed)
Paperwork faxed back to [facility] providers

## 2022-11-18 ENCOUNTER — Encounter: Payer: Self-pay | Admitting: Physician Assistant

## 2022-11-18 ENCOUNTER — Ambulatory Visit: Payer: Medicare Other | Attending: Physician Assistant | Admitting: Physician Assistant

## 2022-11-18 VITALS — BP 125/77 | HR 100 | Ht 69.0 in | Wt 218.2 lb

## 2022-11-18 DIAGNOSIS — I700 Atherosclerosis of aorta: Secondary | ICD-10-CM | POA: Diagnosis not present

## 2022-11-18 DIAGNOSIS — I714 Abdominal aortic aneurysm, without rupture, unspecified: Secondary | ICD-10-CM | POA: Diagnosis not present

## 2022-11-18 DIAGNOSIS — I639 Cerebral infarction, unspecified: Secondary | ICD-10-CM | POA: Diagnosis not present

## 2022-11-18 DIAGNOSIS — I5189 Other ill-defined heart diseases: Secondary | ICD-10-CM | POA: Diagnosis not present

## 2022-11-18 DIAGNOSIS — E785 Hyperlipidemia, unspecified: Secondary | ICD-10-CM | POA: Diagnosis not present

## 2022-11-18 DIAGNOSIS — I890 Lymphedema, not elsewhere classified: Secondary | ICD-10-CM | POA: Diagnosis not present

## 2022-11-18 DIAGNOSIS — I2510 Atherosclerotic heart disease of native coronary artery without angina pectoris: Secondary | ICD-10-CM | POA: Diagnosis not present

## 2022-11-18 DIAGNOSIS — I77819 Aortic ectasia, unspecified site: Secondary | ICD-10-CM

## 2022-11-18 DIAGNOSIS — I2584 Coronary atherosclerosis due to calcified coronary lesion: Secondary | ICD-10-CM | POA: Diagnosis not present

## 2022-11-18 NOTE — Patient Instructions (Addendum)
Medication Instructions:  Please read about Nexletol 180 mg tablets.   *If you need a refill on your cardiac medications before your next appointment, please call your pharmacy*   Lab Work: No labs ordered  If you have labs (blood work) drawn today and your tests are completely normal, you will receive your results only by: [place] (if you have MyChart) OR A paper copy in the mail If you have any lab test that is abnormal or we need to change your treatment, we will call you to review the results.   Testing/Procedures: No testing ordered  Follow-Up: At [facility], you and your health needs are our priority.  As part of our continuing mission to provide you with exceptional heart care, we have created designated Provider Care Teams.  These Care Teams include your primary Cardiologist (physician) and Advanced Practice Providers (APPs -  Physician Assistants and Nurse Practitioners) who all work together to provide you with the care you need, when you need it.  We recommend signing up for the patient portal called "MyChart".  Sign up information is provided on this After Visit Summary.  MyChart is used to connect with patients for Virtual Visits (Telemedicine).  Patients are able to view lab/test results, encounter notes, upcoming appointments, etc.  Non-urgent messages can be sent to your provider as well.   To learn more about what you can do with MyChart, go to [email or web address].    Your next appointment:   4 month(s)  Provider:   You may see Ida Rogue, MD or one of the following Advanced Practice Providers on your designated Care Team:   Murray Hodgkins, NP Christell Faith, PA-C [place] Kathlen Mody, PA-C Gerrie Nordmann, NP   Other Instructions Please schedule an appointment with Dr. Caryl Comes regarding loop monitor.

## 2022-11-18 NOTE — Progress Notes (Signed)
Cardiology Office Note    Date:  11/18/2022   ID:  Jamina, Macbeth June 29, 1946, MRN [id number]  PCP:  [name], NP  Cardiologist:  Ida Rogue, MD  Electrophysiologist:  Virl Axe, MD   Chief Complaint: Follow-up  History of Present Illness:   Sharon [name] is a 77 y.o. 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   She was admitted to the hospital in 12/2017 with slurred speech and weakness involving the left hand with associated facial droop.  She was found to have multiple right cerebral infarcts concerning for embolic phenomena.  Carotid artery ultrasound showed minor carotid artery atherosclerosis with no hemodynamically significant disease.  Surface echo showed an EF of 70% with no regional wall motion abnormalities.  TEE showed an EF of 55 to 65%, no regional wall motion abnormalities, no left atrial appendage thrombus, and no cardiac source of emboli identified with a normal sized aortic root, ascending thoracic aorta, aortic arch, and descending thoracic aorta.  She subsequently underwent ILR implantation with no evidence of A. fib identified.  Echo from 09/2018 showed an EF of 55 to 60%, no regional wall motion abnormalities, grade 1 diastolic dysfunction, upper limit of normal PASP, no significant valvular abnormalities, and a small pericardial effusion identified posterior to the heart.  She was seen in the office in 02/2021 for pre-procedure cardiac risk stratification and was doing well from a cardiac perspective.  She was referred to the lipid clinic for  further management of her hyperlipidemia, and was started on Praluent.  However, this has been discontinued secondary to injection site reaction and influenza-like symptoms.   She was admitted to the hospital in 09/2021 with contact/stasis dermatitis of the left lower extremity.  Venous Doppler was negative for DVT.  Initially, she was treated with antibiotics, though these were discontinued per ID recommendation.  She had notable improvement following initiation of steroid for contact dermatitis.     She was seen in 10/2021 noting improvement in her stasis dermatitis.  With regards to her history of CVA, ILR showed no evidence of A-fib with loop recorder being explanted in 12/2021.   Echo in 12/2021 demonstrated an EF of 55 to 60%, no regional wall motion abnormalities, grade 2 diastolic dysfunction, normal RV systolic function and ventricular cavity size, trivial mitral regurgitation, and an estimated right atrial pressure of 3 mmHg.   AAA ultrasound in 02/2022 demonstrated the largest aortic measurement of 3.3 cm which was essentially unchanged when compared to study from 08/2021, that measured 3.2 cm.  She was last seen in the office in 04/2022 and remained without symptoms of angina or decompensation with stable chronic dyspnea.    Chest CT 07/2022 showed stable aneurysmal dilatation of the descending thoracic aorta measuring 4 cm.  Since we last saw her, she was seen in the ED in 06/2022 left left arm tingling and numbness with radiation to her face.  CT head showed no acute intracranial hemorrhage or evidence of infarct.  MRI of the brain showed no acute intracranial abnormality.  MRA of the head/neck showed no major intracranial arterial occlusion or significant proximal  stenosis with severe left vertebral artery origin stenosis and widely patent cervical carotid arteries.  She was admitted later in 06/2022 with slurred speech with CT of the head showing focal hypodensity involving the right insular  cortex suspicious for an evolving acute right MCA distribution infarct.  MRI of the brain showed an acute ischemic right MCA territory infarct without associated hemorrhage or mass effect.  MRA of the head/neck showed interval right M2 branch occlusion consistent with right MCA territory infarct with wide patency of the carotid artery systems.  The previously noted stenosis at the origin of the left vertebral artery was more mild to moderate in appearance.  Echo during that admission showed an EF of 55 to 60%, no regional wall motion abnormalities, grade 1 diastolic dysfunction, normal RV systolic function and ventricular cavity size, aortic valve sclerosis without evidence of stenosis, and an estimated right atrial pressure of 3 mmHg.  She was most recently seen in the ED in 10/2022 with dizziness and weakness.  CT of the head showed no evidence of hemorrhage or acute infarct.  MRI of the brain showed no acute intracranial process.  She comes in today and is doing well from a cardiac perspective.  She is without symptoms of angina or decompensation.  She does continue to note a change in her speech following her CVA in 06/2022.  She has completed speech therapy.  Otherwise, she does not note any significant residual deficits from her CVA.  She has also been started on ezetimibe and seems to be tolerating this well outside of a headache.  She denies any palpitations, dyspnea, dizziness, presyncope, or syncope.  She continues to be quite hesitant regarding initiation of cholesterol-lowering medication secondary to prior noted intolerance.  She continues to wear compression stockings.   Labs independently reviewed: 10/2022 - potassium 4.3, BUN 16, serum creatinine 0.9, albumin 4.2, AST/ALT normal, Hgb 15.2, PLT 203 08/2022 - TC 175, TG 74, HDL 62, LDL 99 07/2022 - TSH normal 06/2022 - A1c 6.4  Past Medical History:  Diagnosis Date   Anxiety    Arthritis    hands, upper back   Asthma    COPD (chronic  obstructive pulmonary disease) (HCC)    History of cervical cancer    Menopausal disorder    Osteoporosis    Pneumonia 1960   Spasm of abdominal muscles of right side    intermittent   Stroke ([place]) 2019   TMJ (dislocation of temporomandibular joint)    Wears dentures    partial lower    Past Surgical History:  Procedure Laterality Date   ABDOMINAL HYSTERECTOMY  1970's   bladder botox  2005   BLADDER SUSPENSION  2004   CATARACT EXTRACTION W/PHACO Right 03/09/2018   Procedure: CATARACT EXTRACTION PHACO AND INTRAOCULAR LENS PLACEMENT ([place]) right;  Surgeon: Eulogio Bear, MD;  Location: [facility];  Service: Ophthalmology;  Laterality: Right;  CALL CELL 1ST   CATARACT EXTRACTION W/PHACO Left 04/19/2018   Procedure: CATARACT EXTRACTION PHACO AND INTRAOCULAR LENS PLACEMENT ([facility])  LEFT;  Surgeon: Eulogio Bear, MD;  Location: [facility];  Service: Ophthalmology;  Laterality: Left;   COLONOSCOPY WITH PROPOFOL N/A 12/13/2015   Procedure: COLONOSCOPY WITH PROPOFOL;  Surgeon: Lucilla Lame, MD;  Location: [facility];  Service: Endoscopy;  Laterality: N/A;   COLONOSCOPY WITH PROPOFOL N/A 03/14/2021   Procedure: COLONOSCOPY WITH PROPOFOL;  Surgeon: Lucilla Lame, MD;  Location: [facility];  Service: Endoscopy;  Laterality: N/A;  diabetic  LOOP RECORDER INSERTION N/A 01/07/2018   Procedure: LOOP RECORDER INSERTION;  Surgeon: Deboraha Sprang, MD;  Location: [facility];  Service: Cardiovascular;  Laterality: N/A;   POLYPECTOMY N/A 12/13/2015   Procedure: POLYPECTOMY;  Surgeon: Lucilla Lame, MD;  Location: [facility];  Service: Endoscopy;  Laterality: N/A;  SIGMOID COLON POLYPS X  5   POLYPECTOMY N/A 03/14/2021   Procedure: POLYPECTOMY;  Surgeon: Lucilla Lame, MD;  Location: [facility];  Service: Endoscopy;  Laterality: N/A;   SHOULDER ARTHROSCOPY W/ ROTATOR CUFF REPAIR Right 1998   TEE WITHOUT CARDIOVERSION N/A 01/06/2018    Procedure: TRANSESOPHAGEAL ECHOCARDIOGRAM (TEE);  Surgeon: Minna Merritts, MD;  Location: [facility];  Service: Cardiovascular;  Laterality: N/A;   TONSILLECTOMY AND ADENOIDECTOMY     TOOTH EXTRACTION     July 2023    Current Medications: Current Meds  Medication Sig   acetaminophen (TYLENOL) 500 MG tablet Take 500 mg by mouth every 6 (six) hours as needed.   albuterol (PROAIR HFA) 108 (90 Base) MCG/ACT inhaler Inhale 2 puffs into the lungs every 6 (six) hours as needed for wheezing or shortness of breath.   aspirin 81 MG chewable tablet Chew 81 mg by mouth See admin instructions. Take on Mon., Wed., and Friday   Blood Glucose Monitoring Suppl (ONETOUCH VERIO) w/Device KIT Utilize to check blood sugar twice a day, fasting in morning with goal < 130 and then 2 hours after a meal with goal <180.  Document and bring to visits.   BREZTRI AEROSPHERE 160-9-4.8 MCG/ACT AERO INHALE 2 PUFFS BY MOUTH TWICE DAILY AS DIRECTED   Cholecalciferol 1.25 MG (50000 UT) TABS Take 1 tablet by mouth once a week.   citalopram (CELEXA) 20 MG tablet Take 1 tablet (20 mg total) by mouth as needed.   clopidogrel (PLAVIX) 75 MG tablet TAKE 1 TABLET(75 MG) BY MOUTH DAILY   Crisaborole (EUCRISA) 2 % OINT Apply 1 application  topically daily. Use for itching and inflammation at left leg   ezetimibe (ZETIA) 10 MG tablet Take 1 tablet (10 mg total) by mouth daily.   glucose blood (ONETOUCH VERIO) test strip UTILIIZE TO CHECK BLOOD SUGAR TWICE DAILY FASTING IN MORNING WITH GOAL<130 AND THAN 2 HOURS AFTER A MEAL WITH GGOAL<180   Lancets (ONETOUCH ULTRASOFT) lancets Utilize to check blood sugar twice a day, fasting in morning with goal < 130 and then 2 hours after a meal with goal <180.  Document and bring to visits.   loratadine (CLARITIN) 10 MG tablet Take 1 tablet (10 mg total) by mouth daily.   pantoprazole (PROTONIX) 40 MG tablet TAKE 1 TABLET(40 MG) BY MOUTH DAILY    Allergies:   Statins, Dermacerin [aquaphilic],  Nsaids, Praluent [alirocumab], Quinolones, Baclofen, Bactrim [sulfamethoxazole-trimethoprim], Ibuprofen, Lanolin, Latex, Librium [chlordiazepoxide], Naprosyn [naproxen], and Other   Social History   Socioeconomic History   Marital status: Married    Spouse name: Vinette Crites   Number of children: 1   Years of education: Not on file   Highest education level: Some college, no degree  Occupational History   Occupation: retired  Tobacco Use   Smoking status: Former    Years: 56.00    Types: Cigarettes    Quit date: 07/14/2016    Years since quitting: 6.3   Smokeless tobacco: Never  Vaping Use   Vaping Use: Former  Substance and Sexual Activity   Alcohol use: Yes    Comment: Ecologist   Drug use: No  Sexual activity: Not Currently    Birth control/protection: Post-menopausal  Other Topics Concern   Not on file  Social History Narrative   Lives with husband, manages farm   Social Determinants of Health   Financial Resource Strain: Low Risk  (06/03/2022)   Overall Financial Resource Strain (CARDIA)    Difficulty of Paying Living Expenses: Not hard at all  Food Insecurity: No Food Insecurity (07/03/2022)   Hunger Vital Sign    Worried About Running Out of Food in the Last Year: Never true    [place] in the Last Year: Never true  Transportation Needs: No Transportation Needs (07/03/2022)   PRAPARE - Hydrologist (Medical): No    Lack of Transportation (Non-Medical): No  Physical Activity: Inactive (06/03/2022)   Exercise Vital Sign    Days of Exercise per Week: 0 days    Minutes of Exercise per Session: 0 min  Stress: No Stress Concern Present (06/03/2022)   [place]    Feeling of Stress : Only a little  Social Connections: Moderately Isolated (06/03/2022)   Social Connection and Isolation Panel [NHANES]    Frequency of Communication with Friends and Family: More  than three times a week    Frequency of Social Gatherings with Friends and Family: Never    Attends Religious Services: Never    Marine scientist or Organizations: No    Attends Music therapist: Never    Marital Status: Married     Family History:  The patient's family history includes Diabetes in her mother; Heart disease in her mother; Stroke in her maternal grandmother and mother. There is no history of Hyperlipidemia.  ROS:   12-point review of systems is negative unless otherwise noted in the HPI.   EKGs/Labs/Other Studies Reviewed:    Studies reviewed were summarized above. The additional studies were reviewed today:  2D echo 07/03/2022: 1. Left ventricular ejection fraction, by estimation, is 55 to 60%. The  left ventricle has normal function. The left ventricle has no regional  wall motion abnormalities. Left ventricular diastolic parameters are  consistent with Grade I diastolic  dysfunction (impaired relaxation).   2. Right ventricular systolic function is normal. The right ventricular  size is normal.   3. The mitral valve is normal in structure. No evidence of mitral valve  regurgitation. No evidence of mitral stenosis.   4. The aortic valve is normal in structure. There is mild calcification  of the aortic valve. Aortic valve regurgitation is not visualized. Aortic  valve sclerosis/calcification is present, without any evidence of aortic  stenosis.   5. The inferior vena cava is normal in size with greater than 50%  respiratory variability, suggesting right atrial pressure of 3 mmHg.  __________  AAA ultrasound 02/27/2022 (vascular surgery): Summary:  Abdominal Aorta: The largest aortic measurement is 3.3 cm. Mild dilitation  again seen to the proximal abdominal aorta with normal tapering distally  to the distal aorta. Normal caliber CIA's bilaterally. Normal flow  throughout. The largest aortic diameter   remains essentially unchanged  compared to prior exam. Previous diameter  measurement was 3.2 cm obtained on 08/2021.  __________   2D echo 12/25/2021: 1. Left ventricular ejection fraction, by estimation, is 55 to 60%. The  left ventricle has normal function. The left ventricle has no regional  wall motion abnormalities. Left ventricular diastolic parameters are  consistent with Grade II diastolic  dysfunction (  pseudonormalization).   2. Right ventricular systolic function is normal. The right ventricular  size is normal.   3. The mitral valve is normal in structure. Trivial mitral valve  regurgitation.   4. The aortic valve was not well visualized. Aortic valve regurgitation  is not visualized.   5. The inferior vena cava is normal in size with greater than 50%  respiratory variability, suggesting right atrial pressure of 3 mmHg. __________   2D echo 09/2018: - Left ventricle: The cavity size was normal. There was mild    concentric hypertrophy. Systolic function was normal. The    estimated ejection fraction was in the range of 55% to 60%. Wall    motion was normal; there were no regional wall motion    abnormalities. Doppler parameters are consistent with abnormal    left ventricular relaxation (grade 1 diastolic dysfunction).  - Pulmonary arteries: Systolic pressure was at the upper limits of    normal.  - Pericardium, extracardiac: A small pericardial effusion was    identified posterior to the heart. __________   TEE 12/2017: - Left ventricle: Systolic function was normal. The estimated    ejection fraction was in the range of 55% to 65%. Wall motion was    normal; there were no regional wall motion abnormalities.  - Left atrium: No evidence of thrombus in the atrial cavity or    appendage. No evidence of thrombus in the atrial cavity or    appendage.  - Right ventricle: Systolic function was normal.  - Right atrium: No evidence of thrombus in the atrial cavity or    appendage.  - Atrial septum: No  defect or patent foramen ovale was identified.    Echo contrast study showed no right-to-left atrial level shunt,    at baseline or with provocation.   Impressions:   - No cardiac source of emboli was indentified.  __________   2D echo 12/2017: - Procedure narrative: Transthoracic echocardiography. Image    quality was suboptimal. The study was technically difficult.  - Left ventricle: The cavity size was normal. Systolic function was    vigorous. The estimated ejection fraction was 70%. Wall motion    was normal; there were no regional wall motion abnormalities.  - Aortic valve: Valve area (Vmax): 2.92 cm^2.   Impressions:   - No cardiac source of emboli was indentified.   EKG:  EKG is ordered today.  The EKG ordered today demonstrates NSR, 100 bpm, low voltage QRS, baseline artifact with nonspecific ST-T changes  Recent Labs: 07/16/2022: TSH 1.840 10/15/2022: ALT 26; BUN 16; Creatinine, Ser 0.90; Hemoglobin 15.2; Platelets 203; Potassium 4.3; Sodium 138  Recent Lipid Panel    Component Value Date/Time   CHOL 175 08/26/2022 1142   TRIG 74 08/26/2022 1142   HDL 62 08/26/2022 1142   CHOLHDL 3.6 07/03/2022 0609   VLDL 14 07/03/2022 0609   LDLCALC 99 08/26/2022 1142   LDLDIRECT 142.3 (H) 03/21/2021 1024    PHYSICAL EXAM:    VS:  BP 125/77   Pulse 100   Ht '5\' 9"'$  (1.753 m)   Wt 218 lb 3.2 oz (99 kg)   LMP  (LMP Unknown)   SpO2 97%   BMI 32.22 kg/m   BMI: Body mass index is 32.22 kg/m.  Physical Exam Vitals reviewed.  Constitutional:      Appearance: She is well-developed.  HENT:     Head: Normocephalic and atraumatic.  Eyes:     General:  Right eye: No discharge.        Left eye: No discharge.  Neck:     Vascular: No JVD.  Cardiovascular:     Rate and Rhythm: Normal rate and regular rhythm.     Heart sounds: Normal heart sounds, S1 normal and S2 normal. Heart sounds not distant. No midsystolic click and no opening snap. No murmur heard.    No friction  rub.  Pulmonary:     Effort: Pulmonary effort is normal. No respiratory distress.     Breath sounds: Normal breath sounds. No decreased breath sounds, wheezing or rales.  Chest:     Chest wall: No tenderness.  Abdominal:     General: There is no distension.  Musculoskeletal:     Cervical back: Normal range of motion.     Right lower leg: Edema present.     Left lower leg: Edema present.     Comments: Mild bilateral pretibial edema with compression stockings noted.  Skin:    General: Skin is warm and dry.     Nails: There is no clubbing.  Neurological:     Mental Status: She is alert and oriented to person, place, and time.  Psychiatric:        Speech: Speech normal.        Behavior: Behavior normal.        Thought Content: Thought content normal.        Judgment: Judgment normal.     Wt Readings from Last 3 Encounters:  11/18/22 218 lb 3.2 oz (99 kg)  10/15/22 216 lb 0.8 oz (98 kg)  10/07/22 216 lb 3.2 oz (98.1 kg)     ASSESSMENT & PLAN:   Coronary artery calcification/aortic atherosclerosis/HLD: No symptoms concerning for angina or cardiac decompensation.  LDL 99 in 08/2022, now on ezetimibe.  Intolerant to statins and PCSK9 inhibitor.  We did discuss the addition of bempedoic acid, though she has reservations regarding this secondary to prior noted intolerance of other medications.  She would like to read about bempedoic acid and will get back with [place].  No indication for ischemic testing at this time.  Dilated descending thoracic aorta: Stable on CT of the chest in 07/2022 measuring 4 cm with recommendation to be followed up in 07/2023 (follow-up will be included with lung cancer screening CT at that time).  AAA: Stable on ultrasound in 02/2022.  Followed by vascular surgery.  History of recurrent cryptogenic CVA: Prior loop recorder implanted in 2019 through 10/2021 showed no evidence of A-fib.  Now with recurrent CVA involving the right MCA territory.  We will rerefer to EP  for consideration of reimplantation of loop recorder given recurrent cryptogenic stroke in MCA distribution.  She remains on aspirin and clopidogrel.  Continue to revisit escalation of lipid-lowering therapy as outlined above.  Follow-up with neurology and PCP as directed.  Discussed with primary cardiologist.  Diastolic dysfunction: She appears euvolemic and well compensated.  No longer requiring loop diuretic.  Lymphedema: Stable.  Continue compression stockings and lymphedema pumps under the direction of vascular surgery.   Disposition: F/u with Dr. Rockey Situ or an APP in 4 months, and EP as directed.   Medication Adjustments/Labs and Tests Ordered: Current medicines are reviewed at length with the patient today.  Concerns regarding medicines are outlined above. Medication changes, Labs and Tests ordered today are summarized above and listed in the Patient Instructions accessible in Encounters.   Signed, Christell Faith, PA-C 11/18/2022 4:40 PM     [facility] [address] [place], [place] [address] [phone]

## 2022-11-19 ENCOUNTER — Telehealth: Payer: Self-pay | Admitting: Nurse Practitioner

## 2022-11-19 NOTE — Telephone Encounter (Signed)
Called to give patient message below. Unable to leave a voicemail

## 2022-11-19 NOTE — Telephone Encounter (Signed)
Pt states that she had her eyes checked at [facility] and is wanting her results. Pt states that she had a stroke in September and is wanting to see if her PCP was sent her results.  Please advise

## 2022-11-21 ENCOUNTER — Telehealth: Payer: Self-pay

## 2022-11-21 NOTE — Progress Notes (Unsigned)
Care Management & Coordination Services Pharmacy Team  Reason for Encounter: General adherence update   Contacted patient for general health update and medication adherence call.  {US HC Outreach:28874}   Recent office visits:  10/07/22-Jolene T. [name], NP (PCP) Seen for CVA follow up visit. Labs ordered. Ambulatory referral to Home Health. Follow up in 3 months.   Recent consult visits:  11/18/22-Ryan M. Lorrin Jackson PA-C (Cardiology) Seen for general cardiac follow up visit.  F/u with Dr. Rockey Situ or an APP in 4 months  10/08/22-Tara Nicole Kindred, MD (Dermatology) Seen for a follow up visit for a rash.  09/11/22-Zchary Sebastian Ache, MD (Neurology) Seen for a stroke. Decrease aspirin 81 mg.  09/02/22-Fallon E. Owens Shark, NP (Vascular surgery) Seen for a 6 month follow up visit.  08/26/22-Jolene T. Ned Card, NP (PCP) Seen for hyperlipidemia and cerebrovascular accident. Labs ordered. Follow up in 6 weeks. 07/16/22-Jolene T. Ned Card, NP (PCP) Seen for a general follow up visit. Labs ordered. Ambulatory referral to Neurology. Follow up in 4 weeks. 06/27/22-Erin E. Mecum, PA-C. Seen for painful urination. Start on fosfomycin (MONUROL) 3 g PACK.   Hospital visits:  Medication Reconciliation was completed by comparing discharge summary, patient's EMR and Pharmacy list, and upon discussion with patient.  Admitted to the hospital on 10/15/22 due to Weakness. Discharge date was 10/15/22. Discharged from [place]?Medications Started at [facility] Discharge:?? -started none noted  Medication Changes at Hospital Discharge: -Changed none noted  Medications Discontinued at Hospital Discharge: -Stopped none noted  Medications that remain the same after Hospital Discharge:??  -All other medications will remain the same.     Medication Reconciliation was completed by comparing discharge summary, patient's EMR and Pharmacy list, and upon discussion with patient.  Admitted to the hospital on  07/02/22 due to Code stroke. Discharge date was 07/04/22. Discharged from [place]?Medications Started at [facility] Discharge:?? -started none noted  Medication Changes at Hospital Discharge: -Changed none noted  Medications Discontinued at Hospital Discharge: -Stopped none noted  Medications that remain the same after Hospital Discharge:??  -All other medications will remain the same.   Medication Reconciliation was completed by comparing discharge summary, patient's EMR and Pharmacy list, and upon discussion with patient.  Admitted to the hospital on 06/26/22 due to Code stroke. Discharge date was 07/04/22. Discharged from [place]?Medications Started at [facility] Discharge:?? -started none noted  Medication Changes at Hospital Discharge: -Changed none noted  Medications Discontinued at Hospital Discharge: -Stopped none noted  Medications that remain the same after Hospital Discharge:??  -All other medications will remain the same.  Medications: Outpatient Encounter Medications as of 11/21/2022  Medication Sig   acetaminophen (TYLENOL) 500 MG tablet Take 500 mg by mouth every 6 (six) hours as needed.   albuterol (PROAIR HFA) 108 (90 Base) MCG/ACT inhaler Inhale 2 puffs into the lungs every 6 (six) hours as needed for wheezing or shortness of breath.   aspirin 81 MG chewable tablet Chew 81 mg by mouth See admin instructions. Take on Mon., Wed., and Friday   Blood Glucose Monitoring Suppl (ONETOUCH VERIO) w/Device KIT Utilize to check blood sugar twice a day, fasting in morning with goal < 130 and then 2 hours after a meal with goal <180.  Document and bring to visits.   BREZTRI AEROSPHERE 160-9-4.8 MCG/ACT AERO INHALE 2 PUFFS BY MOUTH TWICE DAILY AS DIRECTED   Cholecalciferol 1.25 MG (50000 UT) TABS Take 1 tablet by mouth once  a week.   citalopram (CELEXA) 20 MG tablet Take 1 tablet (20 mg total) by mouth as needed.   clopidogrel  (PLAVIX) 75 MG tablet TAKE 1 TABLET(75 MG) BY MOUTH DAILY   Crisaborole (EUCRISA) 2 % OINT Apply 1 application  topically daily. Use for itching and inflammation at left leg   ezetimibe (ZETIA) 10 MG tablet Take 1 tablet (10 mg total) by mouth daily.   glucose blood (ONETOUCH VERIO) test strip UTILIIZE TO CHECK BLOOD SUGAR TWICE DAILY FASTING IN MORNING WITH GOAL<130 AND THAN 2 HOURS AFTER A MEAL WITH GGOAL<180   Lancets (ONETOUCH ULTRASOFT) lancets Utilize to check blood sugar twice a day, fasting in morning with goal < 130 and then 2 hours after a meal with goal <180.  Document and bring to visits.   loratadine (CLARITIN) 10 MG tablet Take 1 tablet (10 mg total) by mouth daily.   pantoprazole (PROTONIX) 40 MG tablet TAKE 1 TABLET(40 MG) BY MOUTH DAILY   phenazopyridine (PYRIDIUM) 100 MG tablet Take 1 tablet (100 mg total) by mouth 3 (three) times daily as needed for pain. (Patient not taking: Reported on 09/02/2022)   No facility-administered encounter medications on file as of 11/21/2022.    Recent vitals BP Readings from Last 3 Encounters:  11/18/22 125/77  10/15/22 (!) 122/59  10/08/22 115/66   Pulse Readings from Last 3 Encounters:  11/18/22 100  10/15/22 79  10/08/22 99   Wt Readings from Last 3 Encounters:  11/18/22 218 lb 3.2 oz (99 kg)  10/15/22 216 lb 0.8 oz (98 kg)  10/07/22 216 lb 3.2 oz (98.1 kg)   BMI Readings from Last 3 Encounters:  11/18/22 32.22 kg/m  10/15/22 31.91 kg/m  10/07/22 31.91 kg/m    Recent lab results    Component Value Date/Time   NA 138 10/15/2022 1103   NA 142 08/26/2022 1142   K 4.3 10/15/2022 1103   CL 105 10/15/2022 1103   CO2 24 10/15/2022 1103   GLUCOSE 136 (H) 10/15/2022 1103   BUN 16 10/15/2022 1103   BUN 14 08/26/2022 1142   CREATININE 0.90 10/15/2022 1103   CALCIUM 8.9 10/15/2022 1103    Lab Results  Component Value Date   CREATININE 0.90 10/15/2022   EGFR 61 08/26/2022   GFRNONAA >60 10/15/2022   GFRAA 67 07/17/2020    Lab Results  Component Value Date/Time   HGBA1C 6.4 (H) 07/03/2022 06:09 AM   HGBA1C 5.9 (H) 12/31/2021 01:55 PM   HGBA1C 6.6 (H) 10/04/2021 10:26 AM   HGBA1C 6.2 (H) 09/10/2021 03:07 PM   MICROALBUR 80 (H) 12/31/2021 01:55 PM   MICROALBUR 10 09/10/2021 03:07 PM    Lab Results  Component Value Date   CHOL 175 08/26/2022   HDL 62 08/26/2022   LDLCALC 99 08/26/2022   LDLDIRECT 142.3 (H) 03/21/2021   TRIG 74 08/26/2022   CHOLHDL 3.6 07/03/2022   What concerns do you have about your medications?  The patient {denies/reports:25180} side effects with their medications.   How often do you forget or accidentally miss a dose? {missed doses:25554}  Do you use a pillbox? {yes/no:20286}  Are you having any problems getting your medications from your pharmacy? {yes/no:20286}  Has the cost of your medications been a concern? {yes/no:20286} If yes, what medication and is patient assistance available or has it been applied for?  Since last visit with PharmD, {no/thefollowing:25210} interventions have been made.   The patient has had an ED visit since last contact.   The  patient {denies/reports:25180} problems with their health.   Patient {denies/reports:25180} concerns or questions for ***, PharmD at this time.   Counseled patient on: {GENERALCOUNSELING:28686}  Care Gaps: Annual wellness visit in last year? Yes  Star Rating Drugs:  None noted  Corrie Mckusick, RMA

## 2022-11-25 ENCOUNTER — Telehealth: Payer: Self-pay | Admitting: Internal Medicine

## 2022-11-25 NOTE — Telephone Encounter (Signed)
Patient would like a call back to discuss upcoming procedure. She states she needs the instructions on meds.

## 2022-11-25 NOTE — Telephone Encounter (Signed)
The patient called back to the office- call routed directly to me from the call center.   I spoke with the patient regarding the pre-procedure instructions for the possible re-implantation of her ILR as listed below.  The patient voices understanding and is agreeable.

## 2022-11-25 NOTE — Telephone Encounter (Signed)
Reviewed the patient's chart.  She has not seen Dr. Caryl Comes since 12/26/21.  At that time, her previous loop recorder was explanted.   She saw Christell Faith, PA on 11/18/22.  Per Ryan's note from that visit: History of recurrent cryptogenic CVA: Prior loop recorder implanted in 2019 through 10/2021 showed no evidence of A-fib.  Now with recurrent CVA involving the right MCA territory.  We will rerefer to EP for consideration of reimplantation of loop recorder given recurrent cryptogenic stroke in MCA distribution.  She remains on aspirin and clopidogrel.  Continue to revisit escalation of lipid-lowering therapy as outlined above.  Follow-up with neurology and PCP as directed.  Discussed with primary cardiologist.    Instructions for pre-procedure loop implant: Your physician has recommended that you have an Implantable Loop Recorder (LINQ device) placed.   1) Please shower the night before and morning of your procedure with an antibacterial soap (any brand). 2) You may eat a light breakfast/ lunch prior to coming in. 3) You may take all of your regular medications as normal the day of your procedure 4) You may drive yourself home from the procedure 5) If you have someone with you, they will be asked to step out of the room during the implant   Attempted to call the patient to discuss possible upcoming re-implantation of her loop recorder and the above instructions.     No answer- I left  a message to please call back.

## 2022-11-27 ENCOUNTER — Other Ambulatory Visit: Payer: Self-pay | Admitting: Nurse Practitioner

## 2022-11-27 NOTE — Telephone Encounter (Signed)
Requested Prescriptions  Pending Prescriptions Disp Refills   loratadine (CLARITIN) 10 MG tablet [Pharmacy Med Name: LORATADINE 10MG TABLETS] 90 tablet 4    Sig: TAKE 1 TABLET BY MOUTH EVERY DAY     Ear, Nose, and Throat:  Antihistamines 2 Passed - 11/27/2022 12:13 PM      Passed - Cr in normal range and within 360 days    Creatinine, Ser  Date Value Ref Range Status  10/15/2022 0.90 0.44 - 1.00 mg/dL Final         Passed - Valid encounter within last 12 months    Recent Outpatient Visits           1 month ago History of CVA (cerebrovascular accident)   [place], Jolene T, NP   3 months ago Hyperlipidemia associated with type 2 diabetes mellitus ([place])   [place], Niederwald T, NP   4 months ago Type 2 diabetes mellitus with morbid obesity ([place])   [place], Trilla T, NP   5 months ago Acute cystitis without hematuria   [place] [facility] Mecum, Erin E, PA-C   6 months ago Tick bite of right lower leg, initial encounter   [place], PA-C       Future Appointments             In 1 month [name], [name], NP [place], [place]   In 3 months Brendolyn Patty, MD [place]   In 3 months Dunn, Areta Haber, PA-C [place] at [facility]

## 2022-11-28 ENCOUNTER — Ambulatory Visit (INDEPENDENT_AMBULATORY_CARE_PROVIDER_SITE_OTHER): Payer: Medicare Other

## 2022-11-28 DIAGNOSIS — E538 Deficiency of other specified B group vitamins: Secondary | ICD-10-CM | POA: Diagnosis not present

## 2022-11-28 MED ORDER — CYANOCOBALAMIN 1000 MCG/ML IJ SOLN
1000.0000 ug | Freq: Once | INTRAMUSCULAR | Status: AC
  Administered 2022-11-28: 1000 ug via INTRAMUSCULAR

## 2022-12-01 NOTE — Telephone Encounter (Addendum)
Patient states that current rx expired on 11/01/22. Patient did not get medication refilled before the expiration date. Patient states that she has 2 pills left and the pharmacy will not refill. Please send new rx to  [facility] [place], [place] [address] Phone: [phone]  Fax: [phone]

## 2022-12-02 ENCOUNTER — Ambulatory Visit: Payer: Medicare Other | Attending: Internal Medicine | Admitting: Internal Medicine

## 2022-12-02 ENCOUNTER — Ambulatory Visit: Payer: Medicare Other

## 2022-12-02 ENCOUNTER — Encounter: Payer: Self-pay | Admitting: Internal Medicine

## 2022-12-02 VITALS — BP 137/67 | HR 80 | Ht 69.0 in | Wt 215.5 lb

## 2022-12-02 DIAGNOSIS — E785 Hyperlipidemia, unspecified: Secondary | ICD-10-CM | POA: Diagnosis not present

## 2022-12-02 DIAGNOSIS — I639 Cerebral infarction, unspecified: Secondary | ICD-10-CM | POA: Diagnosis not present

## 2022-12-02 MED ORDER — CLOPIDOGREL BISULFATE 75 MG PO TABS: 75.0000 mg | ORAL_TABLET | Freq: Every day | ORAL | 0 refills | Status: DC

## 2022-12-02 NOTE — Patient Instructions (Signed)
Medication Instructions:  - Your physician recommends that you continue on your current medications as directed. Please refer to the Current Medication list given to you today.  *If you need a refill on your cardiac medications before your next appointment, please call your pharmacy*   Lab Work: - none ordered  If you have labs (blood work) drawn today and your tests are completely normal, you will receive your results only by: [place] (if you have MyChart) OR A paper copy in the mail If you have any lab test that is abnormal or we need to change your treatment, we will call you to review the results.   Testing/Procedures: - You have been referred to: [facility] team will reach out to you for an appointment We will request that this is a virtual visit as the initial consult    Follow-Up: At [facility], you and your health needs are our priority.  As part of our continuing mission to provide you with exceptional heart care, we have created designated Provider Care Teams.  These Care Teams include your primary Cardiologist (physician) and Advanced Practice Providers (APPs -  Physician Assistants and Nurse Practitioners) who all work together to provide you with the care you need, when you need it.  We recommend signing up for the patient portal called "MyChart".  Sign up information is provided on this After Visit Summary.  MyChart is used to connect with patients for Virtual Visits (Telemedicine).  Patients are able to view lab/test results, encounter notes, upcoming appointments, etc.  Non-urgent messages can be sent to your provider as well.   To learn more about what you can do with MyChart, go to [email or web address].    Your next appointment:   1) As needed with Dr. Caryl Comes   2) As scheduled in June with Christell Faith, PA  Provider:   As above     Other Instructions N/a

## 2022-12-02 NOTE — Progress Notes (Signed)
Normal neurology thinks about that when they are in the house    Patient Care Team: [name], NP as PCP - General (Nurse Practitioner) Minna Merritts, MD as PCP - Cardiology (Cardiology) Deboraha Sprang, MD as PCP - Electrophysiology (Cardiology) Bary Castilla [place] Gleason, MD as Consulting Physician (General Surgery) Kathrine Haddock, NP as Nurse Practitioner (Nurse Practitioner) Minna Merritts, MD as Consulting Physician (Cardiology) Greg Cutter, LCSW as Social Worker (Licensed Clinical Social Worker) Vladimir Faster, [facility] (Inactive) as Pharmacist (Pharmacist)   HPI  Sharon [name] is a 77 y.o. female in follow-up for cryptogenic stroke.  Has had a prior cryptogenic stroke for which she had underwent Linq implantation which was removed 3/23    Normal  LV function, most recently confirmed 1  /23  Coronary artery calcifications treated with clopidogrel, but has been intolerant of statins and PCSK9 therapy  History of a another stroke 9/23; neurology evaluation was strongly suspicious not of a cardioembolic event but an atheroembolic event related to the severe right MCA branch stenosis 90 days of aspirin and ongoing Plavix were recommended Interestingly, "I do suspect that this may have been the etiology of her prior infarct as well given location "  Has struggled with swallowing some coughing while eating but this has gotten better.  Now able to drink water.  Has been very diligent with her physical therapy  DATE TEST EF   9/23 Echo   60 % LA size normal              Date Cr K Hgb LDL  11/23     99  1/24 0.9 4.3 15.2        Records and Results Reviewed   Past Medical History:  Diagnosis Date   Anxiety    Arthritis    hands, upper back   Asthma    COPD (chronic obstructive pulmonary disease) ([place])    History of cervical cancer    Menopausal disorder    Osteoporosis    Pneumonia 1960   Spasm of abdominal muscles of right side    intermittent   Stroke  ([place]) 2019   TMJ (dislocation of temporomandibular joint)    Wears dentures    partial lower    Past Surgical History:  Procedure Laterality Date   ABDOMINAL HYSTERECTOMY  1970's   bladder botox  2005   BLADDER SUSPENSION  2004   CATARACT EXTRACTION W/PHACO Right 03/09/2018   Procedure: CATARACT EXTRACTION PHACO AND INTRAOCULAR LENS PLACEMENT ([place]) right;  Surgeon: Eulogio Bear, MD;  Location: [place];  Service: Ophthalmology;  Laterality: Right;  CALL CELL 1ST   CATARACT EXTRACTION W/PHACO Left 04/19/2018   Procedure: CATARACT EXTRACTION PHACO AND INTRAOCULAR LENS PLACEMENT (IOC)  LEFT;  Surgeon: Eulogio Bear, MD;  Location: [place];  Service: Ophthalmology;  Laterality: Left;   COLONOSCOPY WITH PROPOFOL N/A 12/13/2015   Procedure: COLONOSCOPY WITH PROPOFOL;  Surgeon: Lucilla Lame, MD;  Location: [place];  Service: Endoscopy;  Laterality: N/A;   COLONOSCOPY WITH PROPOFOL N/A 03/14/2021   Procedure: COLONOSCOPY WITH PROPOFOL;  Surgeon: Lucilla Lame, MD;  Location: [place];  Service: Endoscopy;  Laterality: N/A;  diabetic   LOOP RECORDER INSERTION N/A 01/07/2018   Procedure: LOOP RECORDER INSERTION;  Surgeon: Deboraha Sprang, MD;  Location: [place] [facility];  Service: Cardiovascular;  Laterality: N/A;   POLYPECTOMY N/A 12/13/2015   Procedure: POLYPECTOMY;  Surgeon: Lucilla Lame, MD;  Location: [place]  [facility];  Service: Endoscopy;  Laterality: N/A;  SIGMOID COLON POLYPS X  5   POLYPECTOMY N/A 03/14/2021   Procedure: POLYPECTOMY;  Surgeon: Lucilla Lame, MD;  Location: [place];  Service: Endoscopy;  Laterality: N/A;   SHOULDER ARTHROSCOPY W/ ROTATOR CUFF REPAIR Right 1998   TEE WITHOUT CARDIOVERSION N/A 01/06/2018   Procedure: TRANSESOPHAGEAL ECHOCARDIOGRAM (TEE);  Surgeon: Minna Merritts, MD;  Location: [facility];  Service: Cardiovascular;  Laterality: N/A;   TONSILLECTOMY AND ADENOIDECTOMY     TOOTH EXTRACTION      July 2023    Current Meds  Medication Sig   acetaminophen (TYLENOL) 500 MG tablet Take 500 mg by mouth every 6 (six) hours as needed.   albuterol (PROAIR HFA) 108 (90 Base) MCG/ACT inhaler Inhale 2 puffs into the lungs every 6 (six) hours as needed for wheezing or shortness of breath.   aspirin 81 MG chewable tablet Chew 81 mg by mouth See admin instructions. Take on Mon., Wed., and Friday   Blood Glucose Monitoring Suppl (ONETOUCH VERIO) w/Device KIT Utilize to check blood sugar twice a day, fasting in morning with goal < 130 and then 2 hours after a meal with goal <180.  Document and bring to visits.   BREZTRI AEROSPHERE 160-9-4.8 MCG/ACT AERO INHALE 2 PUFFS BY MOUTH TWICE DAILY AS DIRECTED   Cholecalciferol 1.25 MG (50000 UT) TABS Take 1 tablet by mouth once a week.   citalopram (CELEXA) 20 MG tablet Take 1 tablet (20 mg total) by mouth as needed.   clopidogrel (PLAVIX) 75 MG tablet TAKE 1 TABLET(75 MG) BY MOUTH DAILY   Crisaborole (EUCRISA) 2 % OINT Apply 1 application  topically daily. Use for itching and inflammation at left leg   ezetimibe (ZETIA) 10 MG tablet Take 1 tablet (10 mg total) by mouth daily.   glucose blood (ONETOUCH VERIO) test strip UTILIIZE TO CHECK BLOOD SUGAR TWICE DAILY FASTING IN MORNING WITH GOAL<130 AND THAN 2 HOURS AFTER A MEAL WITH GGOAL<180   Lancets (ONETOUCH ULTRASOFT) lancets Utilize to check blood sugar twice a day, fasting in morning with goal < 130 and then 2 hours after a meal with goal <180.  Document and bring to visits.   loratadine (CLARITIN) 10 MG tablet Take 1 tablet (10 mg total) by mouth daily.   pantoprazole (PROTONIX) 40 MG tablet TAKE 1 TABLET(40 MG) BY MOUTH DAILY   phenazopyridine (PYRIDIUM) 100 MG tablet Take 1 tablet (100 mg total) by mouth 3 (three) times daily as needed for pain.    Allergies  Allergen Reactions   Statins Other (See Comments)    myalgia   Dermacerin [Aquaphilic]    Nsaids    Praluent [Alirocumab]     Flu like  symptoms   Quinolones     FLUOROQUINOLONES - Pt reports she was told to NEVER take these.   Baclofen Swelling   Bactrim [Sulfamethoxazole-Trimethoprim] Nausea And Vomiting   Ibuprofen Rash    Mouth swelling   Lanolin Rash    Allergy to triamcinolone    Latex Rash    Some bandaids, some gloves; BLOOD TEST NEGATIVE   Librium [Chlordiazepoxide] Itching    Dizziness    Naprosyn [Naproxen] Rash    Mouth swelling   Other Rash    [place] nuts - mouth swelling      Review of Systems negative except from HPI and PMH  Physical Exam BP 137/67 (BP Location: Right Arm, Patient Position: Sitting, Cuff Size: Normal)   Pulse 80   Ht  $5' 9"o$  (1.753 m)   Wt 215 lb 8 oz (97.8 kg)   LMP  (LMP Unknown)   SpO2 98%   BMI 31.82 kg/m  Well developed and nourished in no acute distress HENT normal Neck supple with JVP-  flat  Clear Regular rate and rhythm, no murmurs or gallops Abd-soft with active BS No Clubbing cyanosis edema Skin-warm and dry A & Oriented  Grossly normal sensory and motor function mildly distorted speech  ECG sinus at 80 Intervals 15/08/3 5  CrCl cannot be calculated (Patient's most recent lab result is older than the maximum 21 days allowed.).   Assessment and  Plan Cryptogenic stroke and repeat stroke thought by neuro 9/23 to be atheroembolic from M2 branch stenosis   Prior loop recorder insertion with explant 2/23  Hyperlipidemia   Coronary artery calcifications intolerant of statins and PCSK9  Her stroke was thought by neurology to be atheroembolic.  From the stroke surveillance data, we noted on looking at what the population is 30 to 40% of patients will be found to have A-fib w Surveillance over 3-4 years.  Hence, I am not sure that the specificity of the finding would be more useful than the clinical expertise related to the imaging studies. Antiplatelet therapy at discharge was recommended is aspirin x 90 days and Plavix long-term; per neurologist at  [facility] has continued her on Plavix but has maintained on aspirin 3 days/week.  I am trying to sort out the data which would guide a coherent recommendation.  Importantly in secondary prevention is her lipids.  She has not been able to tolerate statins in PCSK9's, we will reach out to the lipid clinic to see whether she would be a candidate for inclisiran               Current medicines are reviewed at length with the patient today .  The patient does not  have concerns regarding medicines.

## 2022-12-03 ENCOUNTER — Other Ambulatory Visit: Payer: Self-pay | Admitting: Nurse Practitioner

## 2022-12-03 MED ORDER — CITALOPRAM HYDROBROMIDE 20 MG PO TABS: 20.0000 mg | ORAL_TABLET | ORAL | 4 refills | Status: AC | PRN

## 2022-12-03 NOTE — Telephone Encounter (Signed)
Requested Prescriptions  Pending Prescriptions Disp Refills   loratadine (CLARITIN) 10 MG tablet [Pharmacy Med Name: LORATADINE 10MG TABLETS] 90 tablet 0    Sig: TAKE 1 TABLET BY MOUTH EVERY DAY     Ear, Nose, and Throat:  Antihistamines 2 Passed - 12/03/2022  9:54 AM      Passed - Cr in normal range and within 360 days    Creatinine, Ser  Date Value Ref Range Status  10/15/2022 0.90 0.44 - 1.00 mg/dL Final         Passed - Valid encounter within last 12 months    Recent Outpatient Visits           1 month ago History of CVA (cerebrovascular accident)   [place], Jolene T, NP   3 months ago Hyperlipidemia associated with type 2 diabetes mellitus ([place])   [place], Axis T, NP   4 months ago Type 2 diabetes mellitus with morbid obesity ([place])   [place], [place] T, NP   5 months ago Acute cystitis without hematuria   [place] [facility] Mecum, Erin E, PA-C   6 months ago Tick bite of right lower leg, initial encounter   [place], PA-C       Future Appointments             In 1 month [name], [name], NP [place], [place]   In 3 months Brendolyn Patty, MD [place]   In 3 months Dunn, Areta Haber, PA-C [place] at [facility]

## 2022-12-10 ENCOUNTER — Encounter: Payer: Self-pay | Admitting: Nurse Practitioner

## 2022-12-12 DIAGNOSIS — Z8673 Personal history of transient ischemic attack (TIA), and cerebral infarction without residual deficits: Secondary | ICD-10-CM | POA: Diagnosis not present

## 2022-12-15 ENCOUNTER — Emergency Department: Payer: Medicare Other

## 2022-12-15 ENCOUNTER — Other Ambulatory Visit: Payer: Self-pay

## 2022-12-15 ENCOUNTER — Emergency Department
Admission: EM | Admit: 2022-12-15 | Discharge: 2022-12-15 | Disposition: A | Discharge status: Home / Self Care | Payer: Medicare Other | Attending: Emergency Medicine | Admitting: Emergency Medicine

## 2022-12-15 ENCOUNTER — Encounter: Payer: Self-pay | Admitting: Emergency Medicine

## 2022-12-15 DIAGNOSIS — J45909 Unspecified asthma, uncomplicated: Secondary | ICD-10-CM | POA: Insufficient documentation

## 2022-12-15 DIAGNOSIS — R110 Nausea: Secondary | ICD-10-CM | POA: Diagnosis not present

## 2022-12-15 DIAGNOSIS — K8020 Calculus of gallbladder without cholecystitis without obstruction: Secondary | ICD-10-CM | POA: Diagnosis not present

## 2022-12-15 DIAGNOSIS — J449 Chronic obstructive pulmonary disease, unspecified: Secondary | ICD-10-CM | POA: Diagnosis not present

## 2022-12-15 DIAGNOSIS — I2510 Atherosclerotic heart disease of native coronary artery without angina pectoris: Secondary | ICD-10-CM | POA: Diagnosis not present

## 2022-12-15 DIAGNOSIS — I700 Atherosclerosis of aorta: Secondary | ICD-10-CM | POA: Diagnosis not present

## 2022-12-15 DIAGNOSIS — I5032 Chronic diastolic (congestive) heart failure: Secondary | ICD-10-CM | POA: Insufficient documentation

## 2022-12-15 DIAGNOSIS — E119 Type 2 diabetes mellitus without complications: Secondary | ICD-10-CM | POA: Diagnosis not present

## 2022-12-15 DIAGNOSIS — R1011 Right upper quadrant pain: Secondary | ICD-10-CM | POA: Insufficient documentation

## 2022-12-15 DIAGNOSIS — R109 Unspecified abdominal pain: Secondary | ICD-10-CM | POA: Diagnosis not present

## 2022-12-15 LAB — COMPREHENSIVE METABOLIC PANEL
ALT: 25 U/L (ref 0–44)
AST: 26 U/L (ref 15–41)
Albumin: 4 g/dL (ref 3.5–5.0)
Alkaline Phosphatase: 68 U/L (ref 38–126)
Anion gap: 10 (ref 5–15)
BUN: 15 mg/dL (ref 8–23)
CO2: 26 mmol/L (ref 22–32)
Calcium: 9.1 mg/dL (ref 8.9–10.3)
Chloride: 103 mmol/L (ref 98–111)
Creatinine, Ser: 0.88 mg/dL (ref 0.44–1.00)
GFR, Estimated: >60 mL/min (ref >60)
Glucose, Bld: 127 mg/dL — ABNORMAL HIGH (ref 70–99)
Potassium: 4.1 mmol/L (ref 3.5–5.1)
Sodium: 139 mmol/L (ref 135–145)
Total Bilirubin: 0.5 mg/dL (ref 0.3–1.2)
Total Protein: 6.9 g/dL (ref 6.5–8.1)

## 2022-12-15 LAB — URINALYSIS, ROUTINE W REFLEX MICROSCOPIC
Bilirubin Urine: NEGATIVE
Glucose, UA: NEGATIVE mg/dL
Hgb urine dipstick: NEGATIVE
Ketones, ur: NEGATIVE mg/dL
Leukocytes,Ua: NEGATIVE
Nitrite: NEGATIVE
Protein, ur: NEGATIVE mg/dL
Specific Gravity, Urine: 1.004 — ABNORMAL LOW (ref 1.005–1.030)
pH: 6 (ref 5.0–8.0)

## 2022-12-15 LAB — CBC
HCT: 45.2 % (ref 36.0–46.0)
Hemoglobin: 14.6 g/dL (ref 12.0–15.0)
MCH: 29.7 pg (ref 26.0–34.0)
MCHC: 32.3 g/dL (ref 30.0–36.0)
MCV: 91.9 fL (ref 80.0–100.0)
Platelets: 215 10*3/uL (ref 150–400)
RBC: 4.92 MIL/uL (ref 3.87–5.11)
RDW: 13.3 % (ref 11.5–15.5)
WBC: 10.1 10*3/uL (ref 4.0–10.5)
nRBC: 0 % (ref 0.0–0.2)

## 2022-12-15 LAB — LIPASE, BLOOD: Lipase: 25 U/L (ref 11–51)

## 2022-12-15 LAB — TROPONIN I (HIGH SENSITIVITY)
Troponin I (High Sensitivity): 6 ng/L (ref <18)
Troponin I (High Sensitivity): 11 ng/L (ref <18)

## 2022-12-15 MED ORDER — ONDANSETRON HCL 4 MG/2ML IJ SOLN
INTRAMUSCULAR | Status: AC
  Administered 2022-12-15: 4 mg via INTRAVENOUS
  Filled 2022-12-15: qty 2

## 2022-12-15 MED ORDER — FAMOTIDINE 20 MG PO TABS
20.0000 mg | ORAL_TABLET | Freq: Once | ORAL | Status: AC
  Administered 2022-12-15: 20 mg via ORAL
  Filled 2022-12-15: qty 1

## 2022-12-15 MED ORDER — IOHEXOL 300 MG/ML  SOLN
100.0000 mL | Freq: Once | INTRAMUSCULAR | Status: AC | PRN
  Administered 2022-12-15: 100 mL via INTRAVENOUS

## 2022-12-15 MED ORDER — ALUM & MAG HYDROXIDE-SIMETH 200-200-20 MG/5ML PO SUSP
30.0000 mL | Freq: Once | ORAL | Status: AC
  Administered 2022-12-15: 30 mL via ORAL
  Filled 2022-12-15: qty 30

## 2022-12-15 MED ORDER — ONDANSETRON HCL 4 MG/2ML IJ SOLN: 4.0000 mg | Freq: Once | INTRAMUSCULAR | Status: AC

## 2022-12-15 MED ORDER — MORPHINE SULFATE (PF) 2 MG/ML IV SOLN
2.0000 mg | Freq: Once | INTRAVENOUS | Status: AC
  Administered 2022-12-15: 2 mg via INTRAVENOUS
  Filled 2022-12-15: qty 1

## 2022-12-15 MED ORDER — ACETAMINOPHEN 500 MG PO TABS
1000.0000 mg | ORAL_TABLET | Freq: Once | ORAL | Status: AC
  Administered 2022-12-15: 1000 mg via ORAL
  Filled 2022-12-15: qty 2

## 2022-12-15 NOTE — Discharge Instructions (Signed)
We discussed further workup for your gallbladder and or talking to surgical team to have her gallbladder potentially removed but at this time would like to monitor your symptoms at home and return to the ER if things are worsening develop any fevers or any other concerns.  Otherwise follow-up outpatient with the surgical team on Thursday.  Please call their office to confirm the appointment time tomorrow.

## 2022-12-15 NOTE — ED Triage Notes (Signed)
Patient to ED via POV for RUQ abd pain. Denies V/D- felt nauseous PTA. Hx of AAA. Currently take Plavix.

## 2022-12-15 NOTE — ED Provider Notes (Signed)
4:23 PM Assumed care for off going team.   Blood pressure 126/69, pulse 81, temperature 98.1 F (36.7 C), resp. rate 17, SpO2 95 %.  See their HPI for full report but in brief pending trop and dc  11am trop negative.   Discussed with patient she still has a little bit of point tenderness in her right upper abdomen.  She reports this started sometime between 8 and 9 AM therefore we will get a repeat troponin just to ensure no evidence of heart attack.  Will give her some Tylenol for the pain.  Trop  6-11 but this was a 5 hour delta so not significantly rising to suggest ACS  IMPRESSION: 1. No acute process in the abdomen or pelvis. 2. Colonic diverticulosis without evidence of diverticulitis.    IMPRESSION: Cholelithiasis without sonographic evidence of acute cholecystitis.  On repeat evaluation patient continues to have some upper abdominal discomfort.  We discussed admission for surgical eval possible cholecystectomy but can have negative ultrasounds but still have issues with the gallbladder or cystic duct stones.  Patient does not want to jump to getting her gallbladder out.  She reports her pain is improving although not gone completely she would like to try to avoid any type of surgery.  We discussed different options including HIDA scans however unfortunately that cannot be done tonight given after 5 PM or MRI which patient really does not want to have done to rule out a cholecystitis that is being missed on ultrasound.  Patient after discussion of all these options would prefer to go home.  I did message Dr. Peyton Najjar who can see her in office on Thursday.  She understands that if her abdominal pain is worse she develops any fevers that she should return to the ER immediately to have this reevaluated but at this time she would like to be discharged home          Vanessa Jay, MD 12/15/22 1749

## 2022-12-15 NOTE — ED Provider Notes (Signed)
[facility] Provider Note    Event Date/Time   First MD Initiated Contact with Patient 12/15/22 1139     (approximate)   History   Abdominal Pain   HPI  Sharon [name] is a 77 y.o. female past medical history of AAA, diastolic heart failure, COPD, stroke who presents because of abdominal pain.  Symptoms started this morning after eating breakfast.  Pain is located in right upper quadrant has been relatively constant since onset.  Has had some nausea but no vomiting no diarrhea.  No fevers chills.  Denies chest pain or shortness of breath.  Denies history of similar pain.  Denies radiation pain to the back.     Past Medical History:  Diagnosis Date   Anxiety    Arthritis    hands, upper back   Asthma    COPD (chronic obstructive pulmonary disease) (HCC)    History of cervical cancer    Menopausal disorder    Osteoporosis    Pneumonia 1960   Spasm of abdominal muscles of right side    intermittent   Stroke ([place]) 2019   TMJ (dislocation of temporomandibular joint)    Wears dentures    partial lower    Patient Active Problem List   Diagnosis Date Noted   Dysphagia 09/11/2022   Chronic diastolic CHF (congestive heart failure) ([place]) 07/04/2022   Personal history of colonic polyps    Rectal polyp    Peripheral vascular disease due to secondary diabetes ([place]) 01/11/2021   Osteopenia of neck of left femur 01/11/2021   Drug-induced myopathy 09/13/2020   Neuropathy of both feet 05/24/2020   Hyperlipidemia associated with type 2 diabetes mellitus ([place]) 02/14/2020   Morbid obesity ([place]) 02/14/2020   Vitamin D deficiency 06/28/2019   Type 2 diabetes mellitus with morbid obesity ([place]) 06/28/2019   GERD (gastroesophageal reflux disease) 12/07/2018   Vitamin B12 deficiency 12/05/2018   Senile purpura ([place]) 07/28/2018   Lymphedema 07/14/2018   Aneurysm of descending thoracic aorta ([place]) 05/04/2018   Allergic rhinitis 02/16/2018   Aortic atherosclerosis  ([place]) 01/13/2018   History of CVA (cerebrovascular accident)    CAD (coronary artery disease) 11/25/2017   Caregiver stress 11/25/2017   Stress incontinence 06/19/2017   Personal history of tobacco use, presenting hazards to health 04/01/2017   Anxiety 03/13/2017   Advanced care planning/counseling discussion 10/17/2016   Centrilobular emphysema ([place]) 10/17/2015     Physical Exam  Triage Vital Signs: ED Triage Vitals  Enc Vitals Group     BP 12/15/22 1117 (!) 140/99     Pulse Rate 12/15/22 1117 93     Resp 12/15/22 1117 18     Temp 12/15/22 1117 98.1 F (36.7 C)     Temp src --      SpO2 12/15/22 1117 93 %     Weight --      Height --      Head Circumference --      Peak Flow --      Pain Score 12/15/22 1115 4     Pain Loc --      Pain Edu? --      Excl. in [place]? --     Most recent vital signs: Vitals:   12/15/22 1230 12/15/22 1300  BP: (!) 127/102 130/84  Pulse: 85 79  Resp: (!) 22 20  Temp:    SpO2: 97% 94%     General: Awake, no distress.  CV:  Good peripheral perfusion.  Resp:  Normal effort.  Abd:  No distention.  Tenderness to palpation the right upper quadrant with no guarding, mild tenderness palpation epigastric region and right mid abdomen Neuro:             Awake, Alert, Oriented x 3  Other:     ED Results / Procedures / Treatments  Labs (all labs ordered are listed, but only abnormal results are displayed) Labs Reviewed  COMPREHENSIVE METABOLIC PANEL - Abnormal; Notable for the following components:      Result Value   Glucose, Bld 127 (*)    All other components within normal limits  URINALYSIS, ROUTINE W REFLEX MICROSCOPIC - Abnormal; Notable for the following components:   Color, Urine STRAW (*)    APPearance CLEAR (*)    Specific Gravity, Urine 1.004 (*)    All other components within normal limits  LIPASE, BLOOD  CBC  TROPONIN I (HIGH SENSITIVITY)     EKG  EKG interpretation performed by myself: NSR, nml axis, nml intervals,  slight ST depression V5 V6   RADIOLOGY I reviewed and interpreted the CT of the abdomen pelvis which is negative for acute pathology   PROCEDURES:  Critical Care performed: No  Procedures  The patient is on the cardiac monitor to evaluate for evidence of arrhythmia and/or significant heart rate changes.   MEDICATIONS ORDERED IN ED: Medications  alum & mag hydroxide-simeth (MAALOX/MYLANTA) 200-200-20 MG/5ML suspension 30 mL (has no administration in time range)  famotidine (PEPCID) tablet 20 mg (has no administration in time range)  morphine (PF) 2 MG/ML injection 2 mg (2 mg Intravenous Given 12/15/22 1224)  ondansetron (ZOFRAN) injection 4 mg (4 mg Intravenous Given 12/15/22 1224)  iohexol (OMNIPAQUE) 300 MG/ML solution 100 mL (100 mLs Intravenous Contrast Given 12/15/22 1330)     IMPRESSION / MDM / ASSESSMENT AND PLAN / ED COURSE  I reviewed the triage vital signs and the nursing notes.                              Patient's presentation is most consistent with acute presentation with potential threat to life or bodily function.  Differential diagnosis includes, but is not limited to, cholecystitis, biliary colic, pancreatitis, GERD, gastritis, symptomatic AAA  The patient is 77 year old female presents with right upper quadrant pain and nausea since this morning.  Pain is associated with nausea but no vomiting no fevers or chills.  She has not had similar pain in the past.  It is rather constant.  On exam she does have tenderness in the right upper quadrant, and mild tenderness in the right mid abdomen but exam is overall benign.  Due to history of aneurysm we will start with CT of the abdomen pelvis and proceed with right upper quadrant ultrasound if this is negative.  Will obtain labs and give morphine for pain.  Labs overall reassuring lipase and LFTs are normal.  Urinalysis is clear.  On repeat assessment patient is still having ongoing abdominal pain.  Will obtain right upper  quadrant ultrasound.  Will give Maalox and Pepcid.  Will add on troponin patient's EKG did have some subtle ST depression V5 V6 although she is not having any chest pains feel that ACS is less likely.  Right upper quadrant ultrasound without findings to suggest cholecystitis.  Patient signed out to oncoming rider pending troponin.  If negative I think the patient can safely be discharged.       FINAL  CLINICAL IMPRESSION(S) / ED DIAGNOSES   Final diagnoses:  RUQ pain     Rx / DC Orders   ED Discharge Orders     None        Note:  This document was prepared using Dragon voice recognition software and may include unintentional dictation errors.   Rada Hay, MD 12/16/22 [phone]

## 2022-12-18 DIAGNOSIS — K8010 Calculus of gallbladder with chronic cholecystitis without obstruction: Secondary | ICD-10-CM | POA: Diagnosis not present

## 2022-12-19 ENCOUNTER — Telehealth: Payer: Self-pay

## 2022-12-19 NOTE — Telephone Encounter (Signed)
        Patient  visited [place] on 3/4  Telephone encounter attempt :  1st  A HIPAA compliant voice message was left requesting a return call.  Instructed patient to call back .    Aron Needles [facility] Guide, [place] [phone] [address], [place], [place] [address] Phone: [phone] Email: [email or web address]       

## 2022-12-20 ENCOUNTER — Other Ambulatory Visit: Payer: Self-pay | Admitting: Nurse Practitioner

## 2022-12-22 NOTE — Telephone Encounter (Signed)
Unable to refill per protocol, Rx request is too soon. Last refill 12/02/22 for 90 days.  Requested Prescriptions  Pending Prescriptions Disp Refills   clopidogrel (PLAVIX) 75 MG tablet [Pharmacy Med Name: CLOPIDOGREL '75MG'$  TABLETS] 90 tablet 0    Sig: TAKE 1 TABLET BY MOUTH EVERY DAY     Hematology: Antiplatelets - clopidogrel Passed - 12/20/2022  3:17 PM      Passed - HCT in normal range and within 180 days    HCT  Date Value Ref Range Status  12/15/2022 45.2 36.0 - 46.0 % Final   Hematocrit  Date Value Ref Range Status  07/16/2022 42.0 34.0 - 46.6 % Final         Passed - HGB in normal range and within 180 days    Hemoglobin  Date Value Ref Range Status  12/15/2022 14.6 12.0 - 15.0 g/dL Final  07/16/2022 14.3 11.1 - 15.9 g/dL Final         Passed - PLT in normal range and within 180 days    Platelets  Date Value Ref Range Status  12/15/2022 215 150 - 400 K/uL Final  07/16/2022 234 150 - 450 x10E3/uL Final         Passed - Cr in normal range and within 360 days    Creatinine, Ser  Date Value Ref Range Status  12/15/2022 0.88 0.44 - 1.00 mg/dL Final         Passed - Valid encounter within last 6 months    Recent Outpatient Visits           2 months ago History of CVA (cerebrovascular accident)   [place] Star[place]rick, Henrine Screws T, NP   3 months ago Hyperlipidemia associated with type 2 diabetes mellitus ([place])   [place] Xenia, Lakin T, NP   5 months ago Type 2 diabetes mellitus with morbid obesity ([place])   [place], [place] T, NP   5 months ago Acute cystitis without hematuria   [facility] Mecum, Erin E, PA-C   6 months ago Tick bite of right lower leg, initial encounter   [place], Dani Gobble, PA-C       Future Appointments             In 2 weeks  [place] at [facility]   In 3 weeks [place], [name],  NP [place], [place]   In 2 months Brendolyn Patty, MD [place]   In 3 months Dunn, Areta Haber, PA-C [place] at [facility]

## 2022-12-25 DIAGNOSIS — K8010 Calculus of gallbladder with chronic cholecystitis without obstruction: Secondary | ICD-10-CM | POA: Diagnosis not present

## 2022-12-30 ENCOUNTER — Ambulatory Visit (INDEPENDENT_AMBULATORY_CARE_PROVIDER_SITE_OTHER): Payer: Medicare Other

## 2022-12-30 DIAGNOSIS — E538 Deficiency of other specified B group vitamins: Secondary | ICD-10-CM | POA: Diagnosis not present

## 2022-12-30 MED ORDER — CYANOCOBALAMIN 1000 MCG/ML IJ SOLN
1000.0000 ug | Freq: Once | INTRAMUSCULAR | Status: AC
  Administered 2022-12-30: 1000 ug via INTRAMUSCULAR

## 2022-12-31 ENCOUNTER — Telehealth: Payer: Self-pay | Admitting: Internal Medicine

## 2022-12-31 NOTE — Telephone Encounter (Signed)
Sharon [name],  You saw this patient on 11/18/2022. Will you please comment on clearance for cholecystectomy?  Please route your response to P CV DIV Preop. I will communicate with requesting office once you have given recommendations.   Thank you!  Mayra Reel, NP

## 2022-12-31 NOTE — Telephone Encounter (Signed)
   Pre-operative Risk Assessment    Patient Name: Sharon [name]  DOB: 05/06/1946 MRN: [id number]      Request for Surgical Clearance    Procedure:  Cholecystectomy  Date of Surgery:  Clearance TBD                                 Surgeon:  Dr. Bartholomew Boards Group or Practice Name:  [facility] Phone number:  [phone] Fax number:  [phone]   Type of Clearance Requested:  Pharmacy, Plavix 5 days    Type of Anesthesia:  Not Indicated   Additional requests/questions:    Signed, Maxwell Caul   12/31/2022, 1:33 PM

## 2022-12-31 NOTE — Telephone Encounter (Signed)
   Name: TIFFANYAMBER SZYMANSKI  DOB: January 14, 1946  MRN: [id number]  Primary Cardiologist: Ida Rogue, MD   Preoperative team, please contact this patient and set up a phone call appointment for further preoperative risk assessment. Please obtain consent and complete medication review. Thank you for your help.  I confirm that guidance regarding antiplatelet and oral anticoagulation therapy has been completed and, if necessary, noted below.  Patient is on Plavix and aspirin for history of stroke. It appears, per Dr. Olin Pia last note, that neurology is managing her Plavix and aspirin.    Mayra Reel, NP 12/31/2022, 4:59 PM [place]

## 2022-12-31 NOTE — Telephone Encounter (Signed)
When she was last seen, she was without symptoms of angina or cardiac decompensation.  As long as there are no new symptoms when preop contacts or, she should be acceptable risk for noncardiac surgery.

## 2023-01-01 ENCOUNTER — Telehealth: Payer: Self-pay | Admitting: *Deleted

## 2023-01-01 NOTE — Telephone Encounter (Signed)
Pt has been scheduled a tele visit, 01/09/23 2:00.  Consent on file / medications reconciled.

## 2023-01-01 NOTE — Telephone Encounter (Signed)
Pt has been scheduled a tele visit, 01/09/23 2:00.  Consent on file / medications reconciled.    Patient Consent for Virtual Visit        Sharon [name] has provided verbal consent on 01/01/2023 for a virtual visit (video or telephone).   CONSENT FOR VIRTUAL VISIT FOR:  Sharon [name]  By participating in this virtual visit I agree to the following:  I hereby voluntarily request, consent and authorize [place] and its employed or contracted physicians, physician assistants, nurse practitioners or other licensed health care professionals (the Practitioner), to provide me with telemedicine health care services (the "Services") as deemed necessary by the treating Practitioner. I acknowledge and consent to receive the Services by the Practitioner via telemedicine. I understand that the telemedicine visit will involve communicating with the Practitioner through live audiovisual communication technology and the disclosure of certain medical information by electronic transmission. I acknowledge that I have been given the opportunity to request an in-person assessment or other available alternative prior to the telemedicine visit and am voluntarily participating in the telemedicine visit.  I understand that I have the right to withhold or withdraw my consent to the use of telemedicine in the course of my care at any time, without affecting my right to future care or treatment, and that the Practitioner or I may terminate the telemedicine visit at any time. I understand that I have the right to inspect all information obtained and/or recorded in the course of the telemedicine visit and may receive copies of available information for a reasonable fee.  I understand that some of the potential risks of receiving the Services via telemedicine include:  Delay or interruption in medical evaluation due to technological equipment failure or disruption; Information transmitted may not be sufficient (e.g.  poor resolution of images) to allow for appropriate medical decision making by the Practitioner; and/or  In rare instances, security protocols could fail, causing a breach of personal health information.  Furthermore, I acknowledge that it is my responsibility to provide information about my medical history, conditions and care that is complete and accurate to the best of my ability. I acknowledge that Practitioner's advice, recommendations, and/or decision may be based on factors not within their control, such as incomplete or inaccurate data provided by me or distortions of diagnostic images or specimens that may result from electronic transmissions. I understand that the practice of medicine is not an exact science and that Practitioner makes no warranties or guarantees regarding treatment outcomes. I acknowledge that a copy of this consent can be made available to me via my patient portal ([place] [facility]), or I can request a printed copy by calling the office of [facility].    I understand that my insurance will be billed for this visit.   I have read or had this consent read to me. I understand the contents of this consent, which adequately explains the benefits and risks of the Services being provided via telemedicine.  I have been provided ample opportunity to ask questions regarding this consent and the Services and have had my questions answered to my satisfaction. I give my informed consent for the services to be provided through the use of telemedicine in my medical care

## 2023-01-02 ENCOUNTER — Telehealth: Payer: Self-pay | Admitting: Internal Medicine

## 2023-01-02 NOTE — Telephone Encounter (Signed)
Patient wants a call back directly from Dr. Olin Pia RN.

## 2023-01-02 NOTE — Telephone Encounter (Signed)
Returned call to patient and informed her that Dr. Olin Pia nurse is out of the office until next week.  Patient states she is upset and frustrated because she requested her appt with the Pharmacist for the [facility] to be virtual but was told this visit had to be in the office. Patient states she gets very anxious having to ride through [place] traffic, and being on the road in general causes her to have terrible anxiety.  Patient would like to know if it is at all possible to complete consultation with Pharm D for [facility] virtually or via telephone visit. She would like to discuss her options for cholesterol management, but refuses to come in to the office due to issue stated above.  Will forward to Dr. Olin Pia nurse to follow-up with patient on this.  Patient expressed appreciation for assistance.

## 2023-01-06 ENCOUNTER — Ambulatory Visit: Payer: Medicare Other | Admitting: Nurse Practitioner

## 2023-01-06 NOTE — Telephone Encounter (Signed)
Spoke with pt and advised per pharmacist, Jinny Blossom, [facility] pt may have a virtual visit for hyperlipidemia to discuss medication options.  A virtual appointment has been rescheduled for pt to 01/09/2023 at 930am.  Pt verbalizes understanding and thanked Therapist, sports for the call.

## 2023-01-07 ENCOUNTER — Ambulatory Visit: Payer: Medicare Other | Admitting: Nurse Practitioner

## 2023-01-07 ENCOUNTER — Ambulatory Visit: Payer: Medicare Other

## 2023-01-09 ENCOUNTER — Ambulatory Visit: Payer: Self-pay | Admitting: General Surgery

## 2023-01-09 ENCOUNTER — Other Ambulatory Visit: Payer: Self-pay | Admitting: Nurse Practitioner

## 2023-01-09 ENCOUNTER — Ambulatory Visit: Payer: Medicare Other | Attending: Cardiovascular Disease | Admitting: Nurse Practitioner

## 2023-01-09 ENCOUNTER — Ambulatory Visit (INDEPENDENT_AMBULATORY_CARE_PROVIDER_SITE_OTHER): Payer: Medicare Other | Admitting: Pharmacist

## 2023-01-09 DIAGNOSIS — Z01810 Encounter for preprocedural cardiovascular examination: Secondary | ICD-10-CM | POA: Diagnosis not present

## 2023-01-09 DIAGNOSIS — E782 Mixed hyperlipidemia: Secondary | ICD-10-CM

## 2023-01-09 MED ORDER — ROSUVASTATIN CALCIUM 5 MG PO TABS: 5.0000 mg | ORAL_TABLET | Freq: Every day | ORAL | 5 refills | Status: DC

## 2023-01-09 NOTE — Telephone Encounter (Signed)
Requested medications are due for refill today.  Provider to determine  Requested medications are on the active medications list.  yes  Last refill. 11/01/2021 #14 4rf  Future visit scheduled.   yes  Notes to clinic.  Refill not delegated.    Requested Prescriptions  Pending Prescriptions Disp Refills   VITAMIN D3 1.25 MG (50000 UT) capsule [Pharmacy Med Name: VITAMIN D3 50,000 IU (CHOLE) CAP] 14 capsule     Sig: TAKE ONE CAPSULE BY MOUTH ONCE A WEEK     Endocrinology:  Vitamins - Vitamin D Supplementation 2 Failed - 01/09/2023  1:51 PM      Failed - Manual Review: Route requests for 50,000 IU strength to the provider      Failed - Vitamin D in normal range and within 360 days    Vit D, 25-Hydroxy  Date Value Ref Range Status  09/10/2021 49.4 30.0 - 100.0 ng/mL Final    Comment:    Vitamin D deficiency has been defined by the Institute of Medicine and an Endocrine Society practice guideline as a level of serum 25-OH vitamin D less than 20 ng/mL (1,2). The Endocrine Society went on to further define vitamin D insufficiency as a level between 21 and 29 ng/mL (2). 1. IOM (Institute of Medicine). 2010. Dietary reference    intakes for calcium and D. [place]: The    [employer]. 2. Holick MF, Binkley [place], Bischoff-Ferrari HA, et al.    Evaluation, treatment, and prevention of vitamin D    deficiency: an Endocrine Society clinical practice    guideline. JCEM. 2011 Jul; 96(7):1911-30.          Passed - Ca in normal range and within 360 days    Calcium  Date Value Ref Range Status  12/15/2022 9.1 8.9 - 10.3 mg/dL Final         Passed - Valid encounter within last 12 months    Recent Outpatient Visits           3 months ago History of CVA (cerebrovascular accident)   [place], Jolene T, NP   4 months ago Hyperlipidemia associated with type 2 diabetes mellitus ([place])   [place], [place]  T, NP   5 months ago Type 2 diabetes mellitus with morbid obesity ([place])   [place] Williams, [place]ask[place] T, NP   6 months ago Acute cystitis without hematuria   [place], PA-C   7 months ago Tick bite of right lower leg, initial encounter   [place] Mecum, Dani Gobble, PA-C       Future Appointments             In 4 days [name], NP [place], [place]   In 1 month Brendolyn Patty, [place]   In 2 months Dunn, Areta Haber, PA-C [place] at [facility]

## 2023-01-09 NOTE — Progress Notes (Signed)
Virtual Visit via Telephone Note   Because of Sharon [name]'s co-morbid illnesses, she is at least at moderate risk for complications without adequate follow up.  This format is felt to be most appropriate for this patient at this time.  The patient did not have access to video technology/had technical difficulties with video requiring transitioning to audio format only (telephone).  All issues noted in this document were discussed and addressed.  No physical exam could be performed with this format.  Please refer to the patient's chart for her consent to telehealth for [facility]  Evaluation Performed:  Preoperative cardiovascular risk assessment _____________   Date:  01/09/2023   Patient ID:  Sharon [name], DOB [birth year], MRN [id number] Patient Location:  Home Provider location:   Office  Primary Care Provider:  [name], NP Primary Cardiologist:  Ida Rogue, MD  Chief Complaint / Patient Profile   77 y.o. y/o female with a h/o coronary artery calcification, arctic atherosclerosis, relation of ascending aorta, able mild AAA by ultrasound in 02/2022, followed by vascular surgery, COPD secondary to prior tobacco use, lymphedema, cervical cancer, asthma, and anxiety cryptogenic stroke who is pending cholecystectomy with Dr. Peyton Najjar of the [facility] and presents today for telephonic preoperative cardiovascular risk assessment.  History of Present Illness    Sharon [name] is a 77 y.o. female who presents via audio/video conferencing for a telehealth visit today.  Pt was last seen in cardiology clinic on 12/02/2022 by Dr. Caryl Comes.  At that time Sharon [name] was doing well.  The patient is now pending procedure as outlined above. Since her last visit, she has done well from a cardiac standpoint.  She denies chest pain, palpitations, dyspnea, pnd, orthopnea, n, v, dizziness, syncope, edema, weight gain, or early satiety. All other systems reviewed and are otherwise  negative except as noted above.   Past Medical History    Past Medical History:  Diagnosis Date   Anxiety    Arthritis    hands, upper back   Asthma    COPD (chronic obstructive pulmonary disease) ([place])    History of cervical cancer    Menopausal disorder    Osteoporosis    Pneumonia 1960   Spasm of abdominal muscles of right side    intermittent   Stroke ([place]) 2019   TMJ (dislocation of temporomandibular joint)    Wears dentures    partial lower   Past Surgical History:  Procedure Laterality Date   ABDOMINAL HYSTERECTOMY  1970's   bladder botox  2005   BLADDER SUSPENSION  2004   CATARACT EXTRACTION W/PHACO Right 03/09/2018   Procedure: CATARACT EXTRACTION PHACO AND INTRAOCULAR LENS PLACEMENT ([place]) right;  Surgeon: Eulogio Bear, MD;  Location: [place];  Service: Ophthalmology;  Laterality: Right;  CALL CELL 1ST   CATARACT EXTRACTION W/PHACO Left 04/19/2018   Procedure: CATARACT EXTRACTION PHACO AND INTRAOCULAR LENS PLACEMENT ([facility])  LEFT;  Surgeon: Eulogio Bear, MD;  Location: [place];  Service: Ophthalmology;  Laterality: Left;   COLONOSCOPY WITH PROPOFOL N/A 12/13/2015   Procedure: COLONOSCOPY WITH PROPOFOL;  Surgeon: Lucilla Lame, MD;  Location: [place];  Service: Endoscopy;  Laterality: N/A;   COLONOSCOPY WITH PROPOFOL N/A 03/14/2021   Procedure: COLONOSCOPY WITH PROPOFOL;  Surgeon: Lucilla Lame, MD;  Location: [place];  Service: Endoscopy;  Laterality: N/A;  diabetic   LOOP RECORDER INSERTION N/A 01/07/2018   Procedure: LOOP RECORDER INSERTION;  Surgeon: Deboraha Sprang,  MD;  Location: [place] [facility];  Service: Cardiovascular;  Laterality: N/A;   POLYPECTOMY N/A 12/13/2015   Procedure: POLYPECTOMY;  Surgeon: Lucilla Lame, MD;  Location: [place];  Service: Endoscopy;  Laterality: N/A;  SIGMOID COLON POLYPS X  5   POLYPECTOMY N/A 03/14/2021   Procedure: POLYPECTOMY;  Surgeon: Lucilla Lame, MD;   Location: [place];  Service: Endoscopy;  Laterality: N/A;   SHOULDER ARTHROSCOPY W/ ROTATOR CUFF REPAIR Right 1998   TEE WITHOUT CARDIOVERSION N/A 01/06/2018   Procedure: TRANSESOPHAGEAL ECHOCARDIOGRAM (TEE);  Surgeon: Minna Merritts, MD;  Location: [facility];  Service: Cardiovascular;  Laterality: N/A;   TONSILLECTOMY AND ADENOIDECTOMY     TOOTH EXTRACTION     July 2023    Allergies  Allergies  Allergen Reactions   Statins Other (See Comments)    Myalgias and joint pain on atorvastatin 40mg  and rosuvastatin 20mg    Dermacerin [Aquaphilic]    Nsaids    Praluent [Alirocumab]     Flu like symptoms, leg swelling, rash   Quinolones     FLUOROQUINOLONES - Pt reports she was told to NEVER take these.   Baclofen Swelling   Bactrim [Sulfamethoxazole-Trimethoprim] Nausea And Vomiting   Ibuprofen Rash    Mouth swelling   Lanolin Rash    Allergy to triamcinolone    Latex Rash    Some bandaids, some gloves; BLOOD TEST NEGATIVE   Librium [Chlordiazepoxide] Itching    Dizziness    Naprosyn [Naproxen] Rash    Mouth swelling   Other Rash    Bol[place] nuts - mouth swelling    Home Medications    Prior to Admission medications   Medication Sig Start Date End Date Taking? Authorizing Provider  acetaminophen (TYLENOL) 500 MG tablet Take 500 mg by mouth every 6 (six) hours as needed.    [provider]  albuterol (PROAIR HFA) 108 (90 Base) MCG/ACT inhaler Inhale 2 puffs into the lungs every 6 (six) hours as needed for wheezing or shortness of breath. 11/01/21   [name] T, NP  aspirin 81 MG chewable tablet Chew 81 mg by mouth See admin instructions. Take on Mon., Wed., and Friday    [provider]  Blood Glucose Monitoring Suppl (ONETOUCH VERIO) w/Device KIT Utilize to check blood sugar twice a day, fasting in morning with goal < 130 and then 2 hours after a meal with goal <180.  Document and bring to visits. 07/31/20   [name], Jolene T, NP  BREZTRI  AEROSPHERE 160-9-4.8 MCG/ACT AERO INHALE 2 PUFFS BY MOUTH TWICE DAILY AS DIRECTED 11/10/22   [name], Henrine Screws T, NP  Cholecalciferol 1.25 MG (50000 UT) TABS Take 1 tablet by mouth once a week. 11/01/21   [name], Henrine Screws T, NP  citalopram (CELEXA) 20 MG tablet Take 1 tablet (20 mg total) by mouth as needed. 12/03/22   [name] T, NP  clopidogrel (PLAVIX) 75 MG tablet Take 1 tablet (75 mg total) by mouth daily. 12/02/22   Deboraha Sprang, MD  Crisaborole (EUCRISA) 2 % OINT Apply 1 application  topically daily. Use for itching and inflammation at left leg 10/08/22   Brendolyn Patty, MD  Cyanocobalamin 1000 MCG/ML KIT Inject 1 vial as directed every 30 (thirty) days.    [provider]  ezetimibe (ZETIA) 10 MG tablet Take 1 tablet (10 mg total) by mouth daily. 07/04/22 07/04/23  Annita Brod, MD  glucose blood (ONETOUCH VERIO) test strip UTILIIZE TO CHECK BLOOD SUGAR TWICE DAILY FASTING IN  MORNING WITH GOAL<130 AND THAN 2 HOURS AFTER A MEAL WITH GGOAL<180 03/28/22   [name], Henrine Screws T, NP  Lancets (ONETOUCH ULTRASOFT) lancets Utilize to check blood sugar twice a day, fasting in morning with goal < 130 and then 2 hours after a meal with goal <180.  Document and bring to visits. 07/31/20   [name], Henrine Screws T, NP  loratadine (CLARITIN) 10 MG tablet TAKE 1 TABLET BY MOUTH EVERY DAY 12/03/22   [name], Jolene T, NP  pantoprazole (PROTONIX) 40 MG tablet TAKE 1 TABLET(40 MG) BY MOUTH DAILY 07/28/22   [name], Jolene T, NP  rosuvastatin (CRESTOR) 5 MG tablet Take 1 tablet (5 mg total) by mouth daily. 01/09/23   Deboraha Sprang, MD    Physical Exam    Vital Signs:  [name] does not have vital signs available for review today.  Given telephonic nature of communication, physical exam is limited. AAOx3. NAD. Normal affect.  Speech and respirations are unlabored.  Accessory Clinical Findings    None  Assessment & Plan    1.  Preoperative Cardiovascular Risk Assessment:  According to the  Revised Cardiac Risk Index (RCRI), her Perioperative Risk of Major Cardiac Event is (%): 0.9. Her Functional Capacity in METs is: 4.3 according to the Duke Activity Status Index (DASI). Therefore, based on ACC/AHA guidelines, patient would be at acceptable risk for the planned procedure without further cardiovascular testing.   The patient was advised that if she develops new symptoms prior to surgery to contact our office to arrange for a follow-up visit, and she verbalized understanding.  Patient states she was originally prescribed aspirin and Plavix per Dr. Rockey Situ, primary cardiologist.  More recently, is on Plavix and aspirin for history of stroke. It appears, per Dr. Olin Pia last note, that neurology is managing her Plavix and aspirin.,  Cardiac perspective, patient may hold Plavix for 5 days prior to procedure, and aspirin for 5 to 7 days prior to procedure.  However, final recommendations for holding aspirin and Plavix prior to procedure should come from additional managing provider (neurology-Dr. Melrose Nakayama).    A copy of this note will be routed to requesting surgeon.  Time:   Today, I have spent 10 minutes with the patient with telehealth technology discussing medical history, symptoms, and management plan.     Lenna Sciara, NP  01/09/2023, 2:18 PM

## 2023-01-09 NOTE — H&P (Signed)
PATIENT PROFILE: Sharon [name] is a 77 y.o. female who presents to the Clinic for re evaluation of cholelithiasis with recent episode of cholecystitis.  PCP: Sharon Herrlich, NP  HISTORY OF PRESENT ILLNESS: Sharon [name] reports she has been feeling better this week. She endorses that the right upper quadrant pain has significantly improved. She has been taking oral antibiotic therapy and is doing well. She is able to tolerate a diet.  Patient with recent episode of cholecystitis last week. She went to the ED with severe right upper quadrant pain. Pain radiated to her back. Pain improved with pain medication at the ED. She cannot identify any alleviating or aggravating factors. She was found with cholelithiasis with gallbladder wall thickening. She did not wanted to pursue surgery and since the pain improved she was discharged with follow-up with surgery.  PROBLEM LIST: Problem List Date Reviewed: 09/23/2022   Noted  Cerebrovascular accident (CVA) ([place]) 09/11/2022  Dysphagia 09/11/2022   GENERAL REVIEW OF SYSTEMS:   General ROS: negative for - chills, fatigue, fever, weight gain or weight loss Allergy and Immunology ROS: negative for - hives  Hematological and Lymphatic ROS: negative for - bleeding problems or bruising, negative for palpable nodes Endocrine ROS: negative for - heat or cold intolerance, hair changes Respiratory ROS: negative for - cough, shortness of breath or wheezing Cardiovascular ROS: no chest pain or palpitations GI ROS: negative for nausea, vomiting, diarrhea, constipation. Positive abdominal pain Musculoskeletal ROS: negative for - joint swelling or muscle pain Neurological ROS: negative for - confusion, syncope Dermatological ROS: negative for pruritus and rash Psychiatric: negative for anxiety, depression, difficulty sleeping and memory loss  MEDICATIONS: Current Outpatient Medications  Medication Sig Dispense Refill  ACCU-CHEK GUIDE GLUCOSE METER Misc  once daily  ACCU-CHEK GUIDE TEST STRIPS test strip once daily  acetaminophen (TYLENOL) 500 MG tablet Take 500 mg by mouth every 6 (six) hours as needed  albuterol 90 mcg/actuation inhaler Inhale 2 inhalations into the lungs  amoxicillin-clavulanate (AUGMENTIN) 875-125 mg tablet Take 1 tablet (875 mg total) by mouth every 12 (twelve) hours for 10 days 20 tablet 0  aspirin 81 MG EC tablet Take 81 mg by mouth once daily  budesonide-glycopyrrolate-formoterol (BREZTRI AEROSPHERE) 160-9-4.8 mcg/actuation inhaler Inhale 2 inhalations into the lungs 2 (two) times daily  citalopram (CELEXA) 20 MG tablet Take 20 mg by mouth once daily  clopidogreL (PLAVIX) 75 mg tablet Take 75 mg by mouth once daily  ergocalciferol, vitamin D2, 1,250 mcg (50,000 unit) capsule Take 50,000 Units by mouth once a week  ezetimibe (ZETIA) 10 mg tablet Take 10 mg by mouth once daily  loratadine (CLARITIN) 10 mg tablet Take 10 mg by mouth once daily  pantoprazole (PROTONIX) 40 MG DR tablet Take 40 mg by mouth once daily  penicillin v potassium 250 MG tablet  crisaborole (EUCRISA) 2 % Oint Apply topically (Patient not taking: Reported on 12/18/2022)   No current facility-administered medications for this visit.   ALLERGIES: Alirocumab, Baclofen, Bactrim [sulfamethoxazole-trimethoprim], Ibuprofen, Lanolin, Latex, Librium [chlordiazepoxide hcl], Quinolones, Statins-hmg-coa reductase inhibitors, and Triamcinolone (bulk)  PAST MEDICAL HISTORY: History reviewed. No pertinent past medical history.  PAST SURGICAL HISTORY: History reviewed. No pertinent surgical history.   FAMILY HISTORY: History reviewed. No pertinent family history.   SOCIAL HISTORY: Social History   Socioeconomic History  Marital status: Married  Tobacco Use  Smoking status: Former  Types: Cigarettes  Quit date: 08/13/2014  Years since quitting: 8.3  Substance and Sexual Activity  Alcohol use: Defer  Drug  use: Defer   PHYSICAL EXAM: Vitals:   12/25/22 1544  BP: 133/78  Pulse: 84   Body mass index is 31.77 kg/m. Weight: 96.2 kg (212 lb)   GENERAL: Alert, active, oriented x3  HEENT: Pupils equal reactive to light. Extraocular movements are intact. Sclera clear. Palpebral conjunctiva normal red color.Pharynx clear.  NECK: Supple with no palpable mass and no adenopathy.  LUNGS: Sound clear with no rales rhonchi or wheezes.  HEART: Regular rhythm S1 and S2 without murmur.  ABDOMEN: Soft and depressible, nontender with no palpable mass, no hepatomegaly.   EXTREMITIES: Well-developed well-nourished symmetrical with no dependent edema.  NEUROLOGICAL: Awake alert oriented, facial expression symmetrical, moving all extremities.  REVIEW OF DATA: I have reviewed the following data today: No visits with results within 3 Month(s) from this visit.  Latest known visit with results is:  No results found for any previous visit.    ASSESSMENT: Sharon [name] is a 77 y.o. female presenting for consultation for cholelithiasis with recent episode of cholecystitis.  Patient improved with oral antibiotic therapy. Patient eating healthy diet. Significant improvement of pain without any sign of recurrent biliary colic. I again discussed with patient about recommendation of cholecystectomy due to episode of cholecystitis. I discussed with patient that it would be a better idea to have elective cholecystectomy instead of having another episode of acute cholecystitis when she is on Plavix and aspirin and needing emergent surgery.  I discussed with patient surgical intervention, the benefit and its risks. Discussed with patient increased risk of stroke due to previous episode of stroke. Her episode of stroke was in September 2023. We are 6 months out of that episode. She has minimal retrieval weakness on the right side.  Surgical technique (open vs laparoscopic) was discussed. It was also discussed the goals of the surgery (decrease the pain  episodes and avoid the risk of cholecystitis) and the risk of surgery including: bleeding, infection, common bile duct injury, stone retention, injury to other organs such as bowel, liver, stomach, other complications such as hernia, bowel obstruction among others. Also discussed with patient about anesthesia and its complications such as: reaction to medications, pneumonia, heart complications, death, among others.   CCC (chronic calculous cholecystitis) [K80.10]  PLAN: Robotic assisted laparoscopic cholecystectomy  Patient and and her husband verbalized understanding, all questions were answered, and were agreeable with the plan outlined above.   Herbert Pun, MD  Electronically signed by Herbert Pun, MD

## 2023-01-09 NOTE — Progress Notes (Signed)
Patient ID: Sharon [name]                 DOB: Mar 09, 1946                    MRN: [id number]     HPI: Sharon [name] is a 77 y.o. female patient referred to lipid clinic by Dr Caryl Comes. PMH is significant for cryptogenic stroke, coronary artery calcifications, recurrent stroke 06/2022 thought to be atheroembolic from M2 branch stenosis, and statin/PCSK9i intolerance.  Visit today conducted via telephone per pt preference. She is tolerating ezetimibe well, was prescribed this after her second stroke. Had muscle and joint pain on statins but looks like doses prescribed have always been higher. She doesn't recall ever taking a statin every other day or a few days a week. Also experienced muscle and joint pain on metformin. Reports flu like symptoms, leg swelling, and rash on Praluent. Has upcoming gallbladder surgery on 4/17. Sees PCP next Tuesday.  Current Medications: ezetimibe 10mg  daily Intolerances: atorvastatin 40mg  daily, rosuvastatin 20mg  daily - muscle pain and joint pain, Praluent - flu like symptoms, leg swelling, rash Risk Factors: recurrent stroke, CAD, age LDL goal: 55mg /dL  Diet: diet changes since gallbladder issues, hasn't been able to eat salads. Likes grilled chicken and salmon. No fried food or fast food.  Exercise: unable to since 2nd stroke, walks as able.  Family History: Mother with heart disease, DM, and stroke.  Social History: Former tobacco use, quit in 2017, occasional alcohol, no drug use.  Labs: 08/26/22: TC 175, TG 74, HDL 62, LDL 99 - ezetimibe 10mg  daily  Past Medical History:  Diagnosis Date   Anxiety    Arthritis    hands, upper back   Asthma    COPD (chronic obstructive pulmonary disease) ([facility])    History of cervical cancer    Menopausal disorder    Osteoporosis    Pneumonia 1960   Spasm of abdominal muscles of right side    intermittent   Stroke ([place]) 2019   TMJ (dislocation of temporomandibular joint)    Wears dentures    partial lower     Current Outpatient Medications on File Prior to Visit  Medication Sig Dispense Refill   acetaminophen (TYLENOL) 500 MG tablet Take 500 mg by mouth every 6 (six) hours as needed.     albuterol (PROAIR HFA) 108 (90 Base) MCG/ACT inhaler Inhale 2 puffs into the lungs every 6 (six) hours as needed for wheezing or shortness of breath. 18 g 4   aspirin 81 MG chewable tablet Chew 81 mg by mouth See admin instructions. Take on Mon., Wed., and Friday     Blood Glucose Monitoring Suppl (ONETOUCH VERIO) w/Device KIT Utilize to check blood sugar twice a day, fasting in morning with goal < 130 and then 2 hours after a meal with goal <180.  Document and bring to visits. 1 kit 0   BREZTRI AEROSPHERE 160-9-4.8 MCG/ACT AERO INHALE 2 PUFFS BY MOUTH TWICE DAILY AS DIRECTED 10.7 g 11   Cholecalciferol 1.25 MG (50000 UT) TABS Take 1 tablet by mouth once a week. 14 tablet 4   citalopram (CELEXA) 20 MG tablet Take 1 tablet (20 mg total) by mouth as needed. 90 tablet 4   clopidogrel (PLAVIX) 75 MG tablet Take 1 tablet (75 mg total) by mouth daily. 90 tablet 0   Crisaborole (EUCRISA) 2 % OINT Apply 1 application  topically daily. Use for itching and inflammation at left leg 100  g 2   Cyanocobalamin 1000 MCG/ML KIT Inject 1 vial as directed every 30 (thirty) days.     ezetimibe (ZETIA) 10 MG tablet Take 1 tablet (10 mg total) by mouth daily. 30 tablet 11   glucose blood (ONETOUCH VERIO) test strip UTILIIZE TO CHECK BLOOD SUGAR TWICE DAILY FASTING IN MORNING WITH GOAL<130 AND THAN 2 HOURS AFTER A MEAL WITH GGOAL<180 100 strip 3   Lancets (ONETOUCH ULTRASOFT) lancets Utilize to check blood sugar twice a day, fasting in morning with goal < 130 and then 2 hours after a meal with goal <180.  Document and bring to visits. 100 each 12   loratadine (CLARITIN) 10 MG tablet TAKE 1 TABLET BY MOUTH EVERY DAY 90 tablet 0   pantoprazole (PROTONIX) 40 MG tablet TAKE 1 TABLET(40 MG) BY MOUTH DAILY 90 tablet 2   No current  facility-administered medications on file prior to visit.    Allergies  Allergen Reactions   Statins Other (See Comments)    myalgia   Dermacerin [Aquaphilic]    Nsaids    Praluent [Alirocumab]     Flu like symptoms   Quinolones     FLUOROQUINOLONES - Pt reports she was told to NEVER take these.   Baclofen Swelling   Bactrim [Sulfamethoxazole-Trimethoprim] Nausea And Vomiting   Ibuprofen Rash    Mouth swelling   Lanolin Rash    Allergy to triamcinolone    Latex Rash    Some bandaids, some gloves; BLOOD TEST NEGATIVE   Librium [Chlordiazepoxide] Itching    Dizziness    Naprosyn [Naproxen] Rash    Mouth swelling   Other Rash    B[place] nuts - mouth swelling    Assessment/Plan:  1. Hyperlipidemia - LDL 99 on ezetimibe 10mg  daily above goal < 55 given recurrent stroke. Intolerant to high doses of atorvastatin and rosuvastatin, as well as Praluent. Insurance does not cover Leqvio well. Will rechallenge with much lower dose of rosuvastatin 5mg  daily which should bring her LDL to goal. Advised pt if she experiences side effects on this to decrease frequency to every other day. Will continue her ezetimibe. She will have PCP recheck her labs in a few months.  Sharon [name] E. Markell Sciascia, PharmD, BCACP, [place]. [address] [place], [place] [address] Phone: [phone]; Fax: [phone] 01/09/2023 9:52 AM

## 2023-01-11 NOTE — Patient Instructions (Signed)
Diabetes Mellitus Basics  Diabetes mellitus, or diabetes, is a long-term (chronic) disease. It occurs when the body does not properly use sugar (glucose) that is released from food after you eat. Diabetes mellitus may be caused by one or both of these problems: Your pancreas does not make enough of a hormone called insulin. Your body does not react in a normal way to the insulin that it makes. Insulin lets glucose enter cells in your body. This gives you energy. If you have diabetes, glucose cannot get into cells. This causes high blood glucose (hyperglycemia). How to treat and manage diabetes You may need to take insulin or other diabetes medicines daily to keep your glucose in balance. If you are prescribed insulin, you will learn how to give yourself insulin by injection. You may need to adjust the amount of insulin you take based on the foods that you eat. You will need to check your blood glucose levels using a glucose monitor as told by your health care provider. The readings can help determine if you have low or high blood glucose. Generally, you should have these blood glucose levels: Before meals (preprandial): 80-130 mg/dL (4.4-7.2 mmol/L). After meals (postprandial): below 180 mg/dL (10 mmol/L). Hemoglobin A1c (HbA1c) level: less than 7%. Your health care provider will set treatment goals for you. Keep all follow-up visits. This is important. Follow these instructions at home: Diabetes medicines Take your diabetes medicines every day as told by your health care provider. List your diabetes medicines here: Name of medicine: ______________________________ Amount (dose): _______________ Time (a.m./p.m.): _______________ Notes: ___________________________________ Name of medicine: ______________________________ Amount (dose): _______________ Time (a.m./p.m.): _______________ Notes: ___________________________________ Name of medicine: ______________________________ Amount (dose):  _______________ Time (a.m./p.m.): _______________ Notes: ___________________________________ Insulin If you use insulin, list the types of insulin you use here: Insulin type: ______________________________ Amount (dose): _______________ Time (a.m./p.m.): _______________Notes: ___________________________________ Insulin type: ______________________________ Amount (dose): _______________ Time (a.m./p.m.): _______________ Notes: ___________________________________ Insulin type: ______________________________ Amount (dose): _______________ Time (a.m./p.m.): _______________ Notes: ___________________________________ Insulin type: ______________________________ Amount (dose): _______________ Time (a.m./p.m.): _______________ Notes: ___________________________________ Insulin type: ______________________________ Amount (dose): _______________ Time (a.m./p.m.): _______________ Notes: ___________________________________ Managing blood glucose  Check your blood glucose levels using a glucose monitor as told by your health care provider. Write down the times that you check your glucose levels here: Time: _______________ Notes: ___________________________________ Time: _______________ Notes: ___________________________________ Time: _______________ Notes: ___________________________________ Time: _______________ Notes: ___________________________________ Time: _______________ Notes: ___________________________________ Time: _______________ Notes: ___________________________________  Low blood glucose Low blood glucose (hypoglycemia) is when glucose is at or below 70 mg/dL (3.9 mmol/L). Symptoms may include: Feeling: Hungry. Sweaty and clammy. Irritable or easily upset. Dizzy. Sleepy. Having: A fast heartbeat. A headache. A change in your vision. Numbness around the mouth, lips, or tongue. Having trouble with: Moving (coordination). Sleeping. Treating low blood glucose To treat low blood  glucose, eat or drink something containing sugar right away. If you can think clearly and swallow safely, follow the 15:15 rule: Take 15 grams of a fast-acting carb (carbohydrate), as told by your health care provider. Some fast-acting carbs are: Glucose tablets: take 3-4 tablets. Hard candy: eat 3-5 pieces. Fruit juice: drink 4 oz (120 mL). Regular (not diet) soda: drink 4-6 oz (120-180 mL). Honey or sugar: eat 1 Tbsp (15 mL). Check your blood glucose levels 15 minutes after you take the carb. If your glucose is still at or below 70 mg/dL (3.9 mmol/L), take 15 grams of a carb again. If your glucose does not go above 70 mg/dL (3.9 mmol/L) after   3 tries, get help right away. After your glucose goes back to normal, eat a meal or a snack within 1 hour. Treating very low blood glucose If your glucose is at or below 54 mg/dL (3 mmol/L), you have very low blood glucose (severe hypoglycemia). This is an emergency. Do not wait to see if the symptoms will go away. Get medical help right away. Call your local emergency services (911 in the [place] Do not drive yourself to the hospital. Questions to ask your health care provider Should I talk with a diabetes educator? What equipment will I need to care for myself at home? What diabetes medicines do I need? When should I take them? How often do I need to check my blood glucose levels? What number can I call if I have questions? When is my follow-up visit? Where can I find a support group for people with diabetes? Where to find more information American Diabetes Association: www.diabetes.org Association of Diabetes Care and Education Specialists: www.diabeteseducator.org Contact a health care provider if: Your blood glucose is at or above 240 mg/dL (13.3 mmol/L) for 2 days in a row. You have been sick or have had a fever for 2 days or more, and you are not getting better. You have any of these problems for more than 6 hours: You cannot eat or  drink. You feel nauseous. You vomit. You have diarrhea. Get help right away if: Your blood glucose is lower than 54 mg/dL (3 mmol/L). You get confused. You have trouble thinking clearly. You have trouble breathing. These symptoms may represent a serious problem that is an emergency. Do not wait to see if the symptoms will go away. Get medical help right away. Call your local emergency services (911 in the [place] Do not drive yourself to the hospital. Summary Diabetes mellitus is a chronic disease that occurs when the body does not properly use sugar (glucose) that is released from food after you eat. Take insulin and diabetes medicines as told. Check your blood glucose every day, as often as told. Keep all follow-up visits. This is important. This information is not intended to replace advice given to you by your health care provider. Make sure you discuss any questions you have with your health care provider. Document Revised: 01/31/2020 Document Reviewed: 01/31/2020 Else[employer] Patient Education  2023 [employer]  

## 2023-01-12 ENCOUNTER — Telehealth: Payer: Self-pay | Admitting: Pharmacist

## 2023-01-12 NOTE — Telephone Encounter (Signed)
Pt called clinic and left a message. Reports being up all night with a headache after starting rosuvastatin. Took her rosuvastatin at night when she takes her ezetimibe.   Called pt back. Has had a headache the last 3 nights since she's been prescribed rosuvastatin. Does also have bad allergies and a runny nose, taking Claritin which isn't super effective. Was outside a lot more this past weekend. She previously took higher dose of rosuvastatin 20mg  daily (on 5mg  now) and didn't have a headache, did experience myalgias though.  Discussed that headache with rosuvastatin is possible and the timing does seem suspicious, but could also be related to her allergies especially since she didn't experience the same side effect on prior higher rosuvastatin dose. She would like to try taking rosuvastatin during the day to see if she tolerates it better. Advised her this is just fine and that she can call back with a tolerability update in the next few weeks.

## 2023-01-13 ENCOUNTER — Encounter: Payer: Self-pay | Admitting: Nurse Practitioner

## 2023-01-13 ENCOUNTER — Ambulatory Visit (INDEPENDENT_AMBULATORY_CARE_PROVIDER_SITE_OTHER): Payer: Medicare Other | Admitting: Nurse Practitioner

## 2023-01-13 DIAGNOSIS — J432 Centrilobular emphysema: Secondary | ICD-10-CM

## 2023-01-13 DIAGNOSIS — E559 Vitamin D deficiency, unspecified: Secondary | ICD-10-CM | POA: Diagnosis not present

## 2023-01-13 DIAGNOSIS — E785 Hyperlipidemia, unspecified: Secondary | ICD-10-CM | POA: Diagnosis not present

## 2023-01-13 DIAGNOSIS — E1169 Type 2 diabetes mellitus with other specified complication: Secondary | ICD-10-CM

## 2023-01-13 DIAGNOSIS — I7123 Aneurysm of the descending thoracic aorta, without rupture: Secondary | ICD-10-CM

## 2023-01-13 DIAGNOSIS — D692 Other nonthrombocytopenic purpura: Secondary | ICD-10-CM

## 2023-01-13 DIAGNOSIS — I2583 Coronary atherosclerosis due to lipid rich plaque: Secondary | ICD-10-CM

## 2023-01-13 DIAGNOSIS — I5032 Chronic diastolic (congestive) heart failure: Secondary | ICD-10-CM

## 2023-01-13 DIAGNOSIS — I2510 Atherosclerotic heart disease of native coronary artery without angina pectoris: Secondary | ICD-10-CM | POA: Diagnosis not present

## 2023-01-13 DIAGNOSIS — G720 Drug-induced myopathy: Secondary | ICD-10-CM | POA: Diagnosis not present

## 2023-01-13 DIAGNOSIS — F419 Anxiety disorder, unspecified: Secondary | ICD-10-CM

## 2023-01-13 DIAGNOSIS — E538 Deficiency of other specified B group vitamins: Secondary | ICD-10-CM

## 2023-01-13 DIAGNOSIS — I700 Atherosclerosis of aorta: Secondary | ICD-10-CM | POA: Diagnosis not present

## 2023-01-13 DIAGNOSIS — I890 Lymphedema, not elsewhere classified: Secondary | ICD-10-CM

## 2023-01-13 DIAGNOSIS — M85852 Other specified disorders of bone density and structure, left thigh: Secondary | ICD-10-CM

## 2023-01-13 DIAGNOSIS — Z8673 Personal history of transient ischemic attack (TIA), and cerebral infarction without residual deficits: Secondary | ICD-10-CM | POA: Diagnosis not present

## 2023-01-13 DIAGNOSIS — E1351 Other specified diabetes mellitus with diabetic peripheral angiopathy without gangrene: Secondary | ICD-10-CM

## 2023-01-13 DIAGNOSIS — E6609 Other obesity due to excess calories: Secondary | ICD-10-CM

## 2023-01-13 DIAGNOSIS — Z6830 Body mass index (BMI) 30.0-30.9, adult: Secondary | ICD-10-CM

## 2023-01-13 LAB — MICROALBUMIN, URINE WAIVED
Creatinine, Urine Waived: 100 mg/dL (ref 10–300)
Microalb, Ur Waived: 30 mg/L — ABNORMAL HIGH (ref 0–19)
Microalb/Creat Ratio: <30 mg/g (ref <30)

## 2023-01-13 LAB — BAYER DCA HB A1C WAIVED: HB A1C (BAYER DCA - WAIVED): 6.5 % — ABNORMAL HIGH (ref 4.8–5.6)

## 2023-01-13 MED ORDER — PREDNISONE 20 MG PO TABS: 40.0000 mg | ORAL_TABLET | Freq: Every day | ORAL | 0 refills | Status: DC

## 2023-01-13 MED ORDER — AMOXICILLIN-POT CLAVULANATE 875-125 MG PO TABS: 1.0000 | ORAL_TABLET | Freq: Two times a day (BID) | ORAL | 0 refills | Status: DC

## 2023-01-13 NOTE — Assessment & Plan Note (Signed)
Chronic, stable, only uses PRN.  Continue daily Citalopram and monitor.  Adjust dose or medication as needed, do not increase Celexa to 40 MG due to patient age >65.  Denies SI/HI. ?

## 2023-01-13 NOTE — Assessment & Plan Note (Signed)
Chronic, stable, remains off medication at this time -- did not tolerate Metformin in past.  Could consider trial of Farxiga in future.  A1c 6.5% today, stable, and urine ALB 10 February 2023.  At this time continue diet focus, but with history of CVA goal is to maintain A1c <6.5%.  Monitor sugars at home a few days a week and document for provider.

## 2023-01-13 NOTE — Assessment & Plan Note (Signed)
Followed by vascular, last CT scan showed no significant increase in size.  Due for CT scan lung yearly.  Continue to collaborate with vascular. 

## 2023-01-13 NOTE — Assessment & Plan Note (Signed)
Chronic, stable.  Continue monthly injections.  Level up to date on labs and stable. 

## 2023-01-13 NOTE — Assessment & Plan Note (Addendum)
Chronic, ongoing.  Continue use of compression hose daily and pumps.  Continue collaboration with vascular and dermatology team.  Appreciate their input.

## 2023-01-13 NOTE — Assessment & Plan Note (Signed)
Chronic, ongoing.  Noted on CT scan 04/19/2019. Continue Zetia and Crestor for prevention.

## 2023-01-13 NOTE — Assessment & Plan Note (Signed)
Chronic, ongoing with lymphedema and PVD.  Continue to collaborate with vascular and current medication regimen.  A1c today remains stable at 6.5%.  Continue diet focus for diabetes control.

## 2023-01-13 NOTE — Assessment & Plan Note (Addendum)
Chronic, exacerbated at this time.  Continue Breztri, for triple therapy, is tolerating.  Start Prednisone 40 MG daily for 5 days (aware to monitor sugars with this) and Augmentin BID for 7 days.  Continue to collaborate with CCM team.  Continue annual lung screening.  Return in 3 months.

## 2023-01-13 NOTE — Assessment & Plan Note (Signed)
Chronic, ongoing. Continue Zetia at this time as is tolerating + Rosuvastatin at low dose. Have tried multiple statins with ongoing myalgias with use.  Praluent and Zetia also caused discomfort.  Check lipid panel and CMP today with goal LDL<70.  CCM collaboration continues.  May need referral to lipid clinic in future if unable to get LDL <70.

## 2023-01-13 NOTE — Assessment & Plan Note (Addendum)
Recent EF 55-60% on 07/03/22. Euvolemic.  Continue collaboration with cardiology.  Recommend: - Reminded to call for an overnight weight gain of >2 pounds or a weekly weight gain of >5 pounds - not adding salt to food and read food labels. Reviewed the importance of keeping daily sodium intake to 2000mg  daily. - Avoid Ibuprofen products

## 2023-01-13 NOTE — Progress Notes (Signed)
BP 108/63   Pulse 89   Temp 98.9 F (37.2 C) (Oral)   Ht 5' 9.02" (1.753 m)   Wt 209 lb 1.6 oz (94.8 kg)   LMP  (LMP Unknown)   SpO2 99%   BMI 30.86 kg/m    Subjective:    Patient ID: Sharon [name], female    DOB: 1946/10/02, 77 y.o.   MRN: [id number]  HPI: Sharon [name] is a 77 y.o. female  Chief Complaint  Patient presents with   Diabetes   Hypertension   COPD   Centrilobular Emphysema   H/O CVA   PVD   Cough    Congestion, runny nose for past 2 days, Patient believes it is her allergies.   Going for gall bladder removal on 01/28/23.  DIABETES A1c in September was 6.4%.  No current medications, was on Metformin which caused discomfort.  Continues diet focus.    Continues on monthly injections for B12. Has Vitamin D deficiency, continues supplement with recent level remaining stable. Hypoglycemic episodes:no Polydipsia/polyuria: no Visual disturbance: no Chest pain: no Paresthesias: no Glucose Monitoring: yes             Accucheck frequency: Daily             Fasting glucose: 80 to 100 range             Post prandial:             Evening: 155 or 160 if eats spaghetti             Before meals: Taking Insulin?: no             Long acting insulin:             Short acting insulin: Blood Pressure Monitoring: not checking Retinal Examination: Up to Date Foot Exam: Up to Date Pneumovax: Up to Date Influenza: Up To Date Aspirin: no    COPD Using Breztri daily and Albuterol PRN.  In past has taken [employer] and Symbicort.  Started with hoarseness and increased nasal drainage + coughing Thursday last week.  Currently more dry cough.    Goes for annual lung screening annually with last on 08/01/22 noting mild centrilobular emphysema and aortic atherosclerosis.   COPD status: exacerbated Satisfied with current treatment?: yes Oxygen use: no Dyspnea frequency: occasional Cough frequency: intermittent Rescue inhaler frequency:  daily with current  symptoms Limitation of activity: no Productive cough: none Last Spirometry: unknown Pneumovax: Up to Date Influenza: Up to Date    CHRONIC KIDNEY DISEASE Recent labs stable. CKD status: stable Medications renally dose: yes Previous renal evaluation: no Pneumovax:  Up to Date Influenza Vaccine:  Up to Date    HYPERLIPIDEMIA Last saw cardiology 3/39/24, she reports pharmacist with Dr. Caryl Comes recently placed her on Rosuvastatin 5 MG to trial to take along with her Zetia.  Initially had some headaches with this, but is trying to change to taking in morning and so far no headache today.  Has an abdominal aortic ectatic that is < 4 cm, which is being monitored by vascular, last visit 09/02/22.  Has pacemaker in place and recent had check remotely on 01/09/23.    Tried Praluent, which caused worsening joint pain -- same with statins was unable to take these nor Zetia in past, but currently tolerating.  History of CVA in 2019 and then recent 07/02/22.  Had visit with neurology last on 09/11/22. Hyperlipidemia status: good compliance Satisfied with current treatment?  yes Side  effects:  no Medication compliance: good compliance Supplements: none Aspirin:  no The ASCVD Risk score (Arnett DK, et al., 2019) failed to calculate for the following reasons:   The patient has a prior MI or stroke diagnosis Chest pain:  no Coronary artery disease:  no Family history CAD:  no Family history early CAD:  no    LYMPHEDEMA WITH CHRONIC VENOUS INSUFFICIENCY: Has not returned to massage therapist in months due to being discharged from previous lymphedema specialist.  Does endorses difficulty being off feet, due to caring for husband who receives dialysis 3 times a week.  Does take Tylenol for pain, which benefits at times.  Continues to use lymphedema pumps at home.  Had visit to dermatology on 10/08/22, returns in May.  Previous treatments for cellulitis. Duration: months Pain: yes Quality: dull, aching,  and throbbing Frequency: intermittent Context:  fluctuating Decreased function/range of motion: at times Erythema: none Swelling: yes Heat or warmth: none Morning stiffness: yes unsure how long this lasts  DEPRESSION Continues on Celexa 20 MG as needed for mood, uses for when driving down highway -- reports this helps on a PRN basis. Mood status: stable Satisfied with current treatment?: yes Symptom severity: mild  Duration of current treatment : chronic Side effects: no Medication compliance: good compliance Psychotherapy/counseling: none Depressed mood: no Anxious mood: no Anhedonia: no Significant weight loss or gain: no Insomnia: none Fatigue: no Feelings of worthlessness or guilt: no Impaired concentration/indecisiveness: no Suicidal ideations: no Hopelessness: no Crying spells: no    10/07/2022    1:11 PM 06/03/2022    9:24 AM 06/03/2022    8:57 AM 12/31/2021    1:44 PM 09/10/2021    2:43 PM  Depression screen PHQ 2/9  Decreased Interest 0 0 0 0 0  Down, Depressed, Hopeless 0 0 0 0 0  PHQ - 2 Score 0 0 0 0 0  Altered sleeping 1   0 0  Tired, decreased energy 1   1 1   Change in appetite 0   0 0  Feeling bad or failure about yourself  0   0 0  Trouble concentrating 0   0 0  Moving slowly or fidgety/restless 1   0 0  Suicidal thoughts 0   0 0  PHQ-9 Score 3   1 1   Difficult doing work/chores Not difficult at all   Not difficult at all Not difficult at all       01/15/2021    1:37 PM 12/07/2018    1:22 PM  GAD 7 : Generalized Anxiety Score  Nervous, Anxious, on Edge 3 0  Control/stop worrying 3 0  Worry too much - different things 3 0  Trouble relaxing 0 0  Restless 0 0  Easily annoyed or irritable 1 0  Afraid - awful might happen 0 0  Total GAD 7 Score 10 0    Relevant past medical, surgical, family and social history reviewed and updated as indicated. Interim medical history since our last visit reviewed. Allergies and medications reviewed and  updated.  Review of Systems  Constitutional:  Positive for fatigue. Negative for activity change, appetite change, diaphoresis and fever.  HENT:  Positive for congestion, ear pain (R>L), postnasal drip, rhinorrhea, sinus pressure, sinus pain, sore throat and voice change. Negative for ear discharge and sneezing.   Respiratory:  Positive for cough and wheezing. Negative for chest tightness and shortness of breath.   Cardiovascular:  Positive for leg swelling. Negative for chest pain and palpitations.  Gastrointestinal: Negative.   Neurological: Negative.   Psychiatric/Behavioral: Negative.      Per HPI unless specifically indicated above     Objective:    BP 108/63   Pulse 89   Temp 98.9 F (37.2 C) (Oral)   Ht 5' 9.02" (1.753 m)   Wt 209 lb 1.6 oz (94.8 kg)   LMP  (LMP Unknown)   SpO2 99%   BMI 30.86 kg/m   Wt Readings from Last 3 Encounters:  01/13/23 209 lb 1.6 oz (94.8 kg)  12/02/22 215 lb 8 oz (97.8 kg)  11/18/22 218 lb 3.2 oz (99 kg)    Physical Exam Vitals and nursing note reviewed.  Constitutional:      General: She is awake. She is not in acute distress.    Appearance: She is well-developed and well-groomed. She is not ill-appearing or toxic-appearing.  HENT:     Head: Normocephalic.     Right Ear: Hearing normal.     Left Ear: Hearing normal.  Eyes:     General: Lids are normal.        Right eye: No discharge.        Left eye: No discharge.     Conjunctiva/sclera: Conjunctivae normal.  Cardiovascular:     Rate and Rhythm: Normal rate and regular rhythm.     Heart sounds: No murmur heard.    No gallop.  Pulmonary:     Effort: Pulmonary effort is normal. No accessory muscle usage or respiratory distress.     Breath sounds: Normal breath sounds.  Musculoskeletal:     Cervical back: Normal range of motion.     Right lower leg: 2+ Edema present.     Left lower leg: 2+ Edema present.  Skin:    Findings: Bruising (mild noted to upper extremities.) present.   Neurological:     Mental Status: She is alert and oriented to person, place, and time.  Psychiatric:        Attention and Perception: Attention normal.        Mood and Affect: Mood normal.        Behavior: Behavior normal. Behavior is cooperative.        Thought Content: Thought content normal.        Judgment: Judgment normal.     Results for orders placed or performed during the hospital encounter of 12/15/22  Lipase, blood  Result Value Ref Range   Lipase 25 11 - 51 U/L  Comprehensive metabolic panel  Result Value Ref Range   Sodium 139 135 - 145 mmol/L   Potassium 4.1 3.5 - 5.1 mmol/L   Chloride 103 98 - 111 mmol/L   CO2 26 22 - 32 mmol/L   Glucose, Bld 127 (H) 70 - 99 mg/dL   BUN 15 8 - 23 mg/dL   Creatinine, Ser 0.88 0.44 - 1.00 mg/dL   Calcium 9.1 8.9 - 10.3 mg/dL   Total Protein 6.9 6.5 - 8.1 g/dL   Albumin 4.0 3.5 - 5.0 g/dL   AST 26 15 - 41 U/L   ALT 25 0 - 44 U/L   Alkaline Phosphatase 68 38 - 126 U/L   Total Bilirubin 0.5 0.3 - 1.2 mg/dL   GFR, Estimated >60 >60 mL/min   Anion gap 10 5 - 15  CBC  Result Value Ref Range   WBC 10.1 4.0 - 10.5 K/uL   RBC 4.92 3.87 - 5.11 MIL/uL   Hemoglobin 14.6 12.0 - 15.0 g/dL   HCT 45.2  36.0 - 46.0 %   MCV 91.9 80.0 - 100.0 fL   MCH 29.7 26.0 - 34.0 pg   MCHC 32.3 30.0 - 36.0 g/dL   RDW 13.3 11.5 - 15.5 %   Platelets 215 150 - 400 K/uL   nRBC 0.0 0.0 - 0.2 %  Urinalysis, Routine w reflex microscopic -Urine, Clean Catch  Result Value Ref Range   Color, Urine STRAW (A) YELLOW   APPearance CLEAR (A) CLEAR   Specific Gravity, Urine 1.004 (L) 1.005 - 1.030   pH 6.0 5.0 - 8.0   Glucose, UA NEGATIVE NEGATIVE mg/dL   Hgb urine dipstick NEGATIVE NEGATIVE   Bilirubin Urine NEGATIVE NEGATIVE   Ketones, ur NEGATIVE NEGATIVE mg/dL   Protein, ur NEGATIVE NEGATIVE mg/dL   Nitrite NEGATIVE NEGATIVE   Leukocytes,Ua NEGATIVE NEGATIVE  Troponin I (High Sensitivity)  Result Value Ref Range   Troponin I (High Sensitivity) 6 <18  ng/L  Troponin I (High Sensitivity)  Result Value Ref Range   Troponin I (High Sensitivity) 11 <18 ng/L   *Note: Due to a large number of results and/or encounters for the requested time period, some results have not been displayed. A complete set of results can be found in Results Review.      Assessment & Plan:   Problem List Items Addressed This Visit       Cardiovascular and Mediastinum   Aneurysm of descending thoracic aorta    Followed by vascular, last CT scan showed no significant increase in size.  Due for CT scan lung yearly.  Continue to collaborate with vascular.      Relevant Orders   Comprehensive metabolic panel   Lipid Panel w/o Chol/HDL Ratio   Aortic atherosclerosis    Chronic, ongoing.  Noted on CT scan 04/19/2019. Continue Zetia and Crestor for prevention.      Relevant Orders   Comprehensive metabolic panel   Lipid Panel w/o Chol/HDL Ratio   CAD (coronary artery disease)    Chronic, stable with no current symptoms.  Continue current medication regimen and collaboration with cardiology.      Relevant Orders   Comprehensive metabolic panel   Lipid Panel w/o Chol/HDL Ratio   Chronic diastolic CHF (congestive heart failure)    Recent EF 55-60% on 07/03/22. Euvolemic.  Continue collaboration with cardiology.  Recommend: - Reminded to call for an overnight weight gain of >2 pounds or a weekly weight gain of >5 pounds - not adding salt to food and read food labels. Reviewed the importance of keeping daily sodium intake to 2000mg  daily. - Avoid Ibuprofen products      Relevant Orders   Comprehensive metabolic panel   Lipid Panel w/o Chol/HDL Ratio   TSH   Peripheral vascular disease due to secondary diabetes    Chronic, ongoing with lymphedema and PVD.  Continue to collaborate with vascular and current medication regimen.  A1c today remains stable at 6.5%.  Continue diet focus for diabetes control.      Relevant Orders   Comprehensive metabolic panel    Lipid Panel w/o Chol/HDL Ratio   Senile purpura    On daily Plavix.  Recommend cleansing skin with gentle cleanser and use of daily lotion.  Monitor for skin breakdown and address if present.      Relevant Orders   CBC with Differential/Platelet     Respiratory   Centrilobular emphysema    Chronic, exacerbated at this time.  Continue Breztri, for triple therapy, is tolerating.  Start Prednisone 40  MG daily for 5 days (aware to monitor sugars with this) and Augmentin BID for 7 days.  Continue to collaborate with CCM team.  Continue annual lung screening.  Return in 3 months.      Relevant Medications   predniSONE (DELTASONE) 20 MG tablet   Other Relevant Orders   CBC with Differential/Platelet     Endocrine   Hyperlipidemia associated with type 2 diabetes mellitus    Chronic, ongoing. Continue Zetia at this time as is tolerating + Rosuvastatin at low dose. Have tried multiple statins with ongoing myalgias with use.  Praluent and Zetia also caused discomfort.  Check lipid panel and CMP today with goal LDL<70.  CCM collaboration continues.  May need referral to lipid clinic in future if unable to get LDL <70.      Relevant Orders   Bayer DCA Hb A1c Waived   Comprehensive metabolic panel   Lipid Panel w/o Chol/HDL Ratio   Type 2 diabetes mellitus with morbid obesity - Primary    Chronic, stable, remains off medication at this time -- did not tolerate Metformin in past.  Could consider trial of Farxiga in future.  A1c 6.5% today, stable, and urine ALB 10 February 2023.  At this time continue diet focus, but with history of CVA goal is to maintain A1c <6.5%.  Monitor sugars at home a few days a week and document for provider.      Relevant Orders   Bayer DCA Hb A1c Waived   Microalbumin, Urine Waived   Comprehensive metabolic panel     Musculoskeletal and Integument   Drug-induced myopathy    Refer to HLD plan of care.        Other   Anxiety    Chronic, stable, only uses PRN.   Continue daily Citalopram and monitor.  Adjust dose or medication as needed, do not increase Celexa to 40 MG due to patient age >10.  Denies SI/HI.      History of CVA (cerebrovascular accident)    History of in 2019, most recent even 123XX123 = atheroembolic causing CVA (cerebral vascular accident) of right MCA distribution.  At this time continue Zetia and Crestor at low dose, has not tolerated statins in past, even on decreased pattern.  Check labs today.  Discussed neuro goals: A1c <6.5% (current 6.4%), BP <130/80, and LDL <70. Continue to collaborate with neurology.        Lymphedema    Chronic, ongoing.  Continue use of compression hose daily and pumps.  Continue collaboration with vascular and dermatology team.  Appreciate their input.        Obesity    BMI 30.86 with T2DM, HLD, COPD.  Recommended eating smaller high protein, low fat meals more frequently and exercising 30 mins a day 5 times a week with a goal of 10-15lb weight loss in the next 3 months. Patient voiced their understanding and motivation to adhere to these recommendations.       Vitamin B12 deficiency    Chronic, stable.  Continue monthly injections.  Level up to date on labs and stable.      Relevant Orders   Vitamin B12   Vitamin D deficiency    Ongoing.  Recheck Vitamin D level today.  Recommend continue daily Vitamin D3 supplement, 2000 units.      Relevant Orders   VITAMIN D 25 Hydroxy (Vit-D Deficiency, Fractures)     Follow up plan: Return in about 3 months (around 04/14/2023) for T2DM, HTN/HLD, LYMPHEDEMA, COPD,  OSTEOPENIA.

## 2023-01-13 NOTE — Assessment & Plan Note (Signed)
BMI 30.86 with T2DM, HLD, COPD.  Recommended eating smaller high protein, low fat meals more frequently and exercising 30 mins a day 5 times a week with a goal of 10-15lb weight loss in the next 3 months. Patient voiced their understanding and motivation to adhere to these recommendations.

## 2023-01-13 NOTE — Assessment & Plan Note (Addendum)
Ongoing.  Recheck Vitamin D level today.  Recommend continue daily Vitamin D3 supplement, 2000 units.

## 2023-01-13 NOTE — Assessment & Plan Note (Signed)
History of in 2019, most recent even 123XX123 = atheroembolic causing CVA (cerebral vascular accident) of right MCA distribution.  At this time continue Zetia and Crestor at low dose, has not tolerated statins in past, even on decreased pattern.  Check labs today.  Discussed neuro goals: A1c <6.5% (current 6.4%), BP <130/80, and LDL <70. Continue to collaborate with neurology.

## 2023-01-13 NOTE — Assessment & Plan Note (Signed)
Refer to HLD plan of care.

## 2023-01-13 NOTE — Assessment & Plan Note (Signed)
On daily Plavix.  Recommend cleansing skin with gentle cleanser and use of daily lotion.  Monitor for skin breakdown and address if present. 

## 2023-01-13 NOTE — Assessment & Plan Note (Signed)
Chronic, stable with no current symptoms.  Continue current medication regimen and collaboration with cardiology. 

## 2023-01-14 ENCOUNTER — Ambulatory Visit: Payer: Self-pay | Admitting: *Deleted

## 2023-01-14 LAB — COMPREHENSIVE METABOLIC PANEL
ALT: 16 IU/L (ref 0–32)
AST: 19 IU/L (ref 0–40)
Albumin/Globulin Ratio: 1.9 (ref 1.2–2.2)
Albumin: 4.1 g/dL (ref 3.8–4.8)
Alkaline Phosphatase: 85 IU/L (ref 44–121)
BUN/Creatinine Ratio: 14 (ref 12–28)
BUN: 14 mg/dL (ref 8–27)
Bilirubin Total: 0.5 mg/dL (ref 0.0–1.2)
CO2: 25 mmol/L (ref 20–29)
Calcium: 8.9 mg/dL (ref 8.7–10.3)
Chloride: 101 mmol/L (ref 96–106)
Creatinine, Ser: 1 mg/dL (ref 0.57–1.00)
Globulin, Total: 2.2 g/dL (ref 1.5–4.5)
Glucose: 105 mg/dL — ABNORMAL HIGH (ref 70–99)
Potassium: 4.2 mmol/L (ref 3.5–5.2)
Sodium: 139 mmol/L (ref 134–144)
Total Protein: 6.3 g/dL (ref 6.0–8.5)
eGFR: 58 mL/min/{1.73_m2} — ABNORMAL LOW (ref >59)

## 2023-01-14 LAB — CBC WITH DIFFERENTIAL/PLATELET
Basophils Absolute: 0 10*3/uL (ref 0.0–0.2)
Basos: 0 %
EOS (ABSOLUTE): 0 10*3/uL (ref 0.0–0.4)
Eos: 0 %
Hematocrit: 43.1 % (ref 34.0–46.6)
Hemoglobin: 13.9 g/dL (ref 11.1–15.9)
Immature Grans (Abs): 0 10*3/uL (ref 0.0–0.1)
Immature Granulocytes: 0 %
Lymphocytes Absolute: 1.3 10*3/uL (ref 0.7–3.1)
Lymphs: 16 %
MCH: 29.5 pg (ref 26.6–33.0)
MCHC: 32.3 g/dL (ref 31.5–35.7)
MCV: 92 fL (ref 79–97)
Monocytes Absolute: 1.1 10*3/uL — ABNORMAL HIGH (ref 0.1–0.9)
Monocytes: 14 %
Neutrophils Absolute: 5.7 10*3/uL (ref 1.4–7.0)
Neutrophils: 70 %
Platelets: 180 10*3/uL (ref 150–450)
RBC: 4.71 x10E6/uL (ref 3.77–5.28)
RDW: 12.9 % (ref 11.7–15.4)
WBC: 8.1 10*3/uL (ref 3.4–10.8)

## 2023-01-14 LAB — LIPID PANEL W/O CHOL/HDL RATIO
Cholesterol, Total: 111 mg/dL (ref 100–199)
HDL: 44 mg/dL (ref >39)
LDL Chol Calc (NIH): 51 mg/dL (ref 0–99)
Triglycerides: 82 mg/dL (ref 0–149)
VLDL Cholesterol Cal: 16 mg/dL (ref 5–40)

## 2023-01-14 LAB — TSH: TSH: 0.658 u[IU]/mL (ref 0.450–4.500)

## 2023-01-14 LAB — VITAMIN B12: Vitamin B-12: 1097 pg/mL (ref 232–1245)

## 2023-01-14 LAB — VITAMIN D 25 HYDROXY (VIT D DEFICIENCY, FRACTURES): Vit D, 25-Hydroxy: 101 ng/mL — ABNORMAL HIGH (ref 30.0–100.0)

## 2023-01-14 MED ORDER — NIRMATRELVIR/RITONAVIR (PAXLOVID) TABLET (RENAL DOSING): 2.0000 | ORAL_TABLET | Freq: Two times a day (BID) | ORAL | 0 refills | Status: AC

## 2023-01-14 NOTE — Addendum Note (Signed)
Addended by: [name] T on: 01/14/2023 12:18 PM   Modules accepted: Orders

## 2023-01-14 NOTE — Telephone Encounter (Signed)
Summary: Covid+   Patient was seen in office yesterday and is being treated with amoxicillin-clavulanate (AUGMENTIN) 875-125 MG tablet and predniSONE (DELTASONE) 20 MG tablet for a sinus infection. Patient said she took 2 Covid tests this morning and they were both positive. Patient would like to know if provider wants her to change medications.         Reason for Disposition  [1] HIGH RISK patient (e.g., weak immune system, age > 18 years, obesity with BMI 30 or higher, pregnant, chronic lung disease or other chronic medical condition) AND [2] COVID symptoms (e.g., cough, fever)  (Exceptions: Already seen by PCP and no new or worsening symptoms.)  Answer Assessment - Initial Assessment Questions 1. COVID-19 DIAGNOSIS: "How do you know that you have COVID?" (e.g., positive lab test or self-test, diagnosed by doctor or NP/PA, symptoms after exposure).     Positive home test today 2. COVID-19 EXPOSURE: "Was there any known exposure to COVID before the symptoms began?" CDC Definition of close contact: within 6 feet (2 meters) for a total of 15 minutes or more over a 24-hour period.      unknown 3. ONSET: "When did the COVID-19 symptoms start?"      Over the weekend- Friday evening 4. WORST SYMPTOM: "What is your worst symptom?" (e.g., cough, fever, shortness of breath, muscle aches)     Cough, congestion 5. COUGH: "Do you have a cough?" If Yes, ask: "How bad is the cough?"       Cough- dry 6. FEVER: "Do you have a fever?" If Yes, ask: "What is your temperature, how was it measured, and when did it start?"     no 7. RESPIRATORY STATUS: "Describe your breathing?" (e.g., normal; shortness of breath, wheezing, unable to speak)      Breathing- normal at this time- patient did used albuterol last night 8. BETTER-SAME-WORSE: "Are you getting better, staying the same or getting worse compared to yesterday?"  If getting worse, ask, "In what way?"     Worse- weaker, not hungry 9. OTHER SYMPTOMS: "Do you  have any other symptoms?"  (e.g., chills, fatigue, headache, loss of smell or taste, muscle pain, sore throat)     weakness 10. HIGH RISK DISEASE: "Do you have any chronic medical problems?" (e.g., asthma, heart or lung disease, weak immune system, obesity, etc.)       Age, lung disease  Protocols used: Coronavirus (COVID-19) Diagnosed or Suspected-A-AH

## 2023-01-14 NOTE — Progress Notes (Signed)
Contacted via [place] [facility] [place], your labs have returned.  Sorry you have Covid:( UGH.  Rest and take medication I sent in.  - CBC overall stable with no anemia or infection. - Kidney function shows mild reduction in eGFR at 58 which we will monitor, creatinine is normal.  Liver function, AST and ALT, are normal. - Vitamin D level is too high now, I recommend cut back and return to taking Vitamin D3 2000 units daily -- stop weekly supplement. - Cholesterol labs looking much better!!  Continue Rosuvastatin and Zetia if you are tolerating this regimen. - Remainder of labs stable. Any questions? Keep being amazing!!  Thank you for allowing me to participate in your care.  I appreciate you. Kindest regards, Cydney Alvarenga

## 2023-01-14 NOTE — Telephone Encounter (Signed)
  Chief Complaint: + COVID home test x 2 Symptoms: cough, congestion, fatigue, decreased appetite  Frequency: symptoms started Saturday Pertinent Negatives: Patient denies SOB Disposition: [] ED /[] Urgent Care (no appt availability in office) / [] Appointment(In office/virtual)/ []  [facility]/ [] Home Care/ [] Refused Recommended Disposition /[] [facility]/ [x]  Follow-up with PCP Additional Notes: Patient was in office yesterday- she was given treatment for sinus infection- she has now tested + COVID. Patient would like advised form provider as far as continuing treatment given or change to antiviral. Please let her know. COVID protocol reviewed: treatment/isolation

## 2023-01-14 NOTE — Telephone Encounter (Signed)
Patient made aware of Provider's recommendations and verbalized understanding.   

## 2023-01-15 ENCOUNTER — Other Ambulatory Visit: Payer: Self-pay | Admitting: Family Medicine

## 2023-01-15 ENCOUNTER — Encounter: Payer: Self-pay | Admitting: Nurse Practitioner

## 2023-01-16 ENCOUNTER — Telehealth: Payer: Self-pay | Admitting: Pharmacist

## 2023-01-16 NOTE — Telephone Encounter (Signed)
Defer to treating provider.

## 2023-01-16 NOTE — Telephone Encounter (Signed)
Pt called to say that she has tested positive for COVID. Her PCP gave her Paxlovid. She read she isn't supposed to take with plavix or statin. She called her PCP and she told her to hold plavix and rosuvastatin. She has procedure 4/17 (she called the surgeons office who told her to call Wed with an update on how she is feeing). Which means she will have to start holding plavix on the 9th. She will be off of plavix from 4/3-4/18 I expect. On plavix for hx of stroke.  I will let Eula Listen know.

## 2023-01-19 ENCOUNTER — Inpatient Hospital Stay: Admission: RE | Admit: 2023-01-19 | Payer: Medicare Other | Source: Ambulatory Visit

## 2023-01-19 HISTORY — DX: Lymphedema, not elsewhere classified: I89.0

## 2023-01-19 HISTORY — DX: Heart failure, unspecified: I50.9

## 2023-01-19 HISTORY — DX: Atherosclerotic heart disease of native coronary artery without angina pectoris: I25.10

## 2023-01-19 HISTORY — DX: Polyneuropathy, unspecified: G62.9

## 2023-01-19 HISTORY — DX: Peripheral vascular disease, unspecified: I73.9

## 2023-01-19 HISTORY — DX: Gastro-esophageal reflux disease without esophagitis: K21.9

## 2023-01-19 HISTORY — DX: Aneurysm of the descending thoracic aorta, without rupture: I71.23

## 2023-01-19 HISTORY — DX: Hyperlipidemia, unspecified: E78.5

## 2023-01-19 NOTE — Telephone Encounter (Signed)
Patient is requesting a callback from provider or nurse to discuss medications.

## 2023-01-20 ENCOUNTER — Ambulatory Visit (INDEPENDENT_AMBULATORY_CARE_PROVIDER_SITE_OTHER): Payer: Medicare Other | Admitting: Nurse Practitioner

## 2023-01-20 ENCOUNTER — Ambulatory Visit
Admission: RE | Admit: 2023-01-20 | Discharge: 2023-01-20 | Disposition: A | Discharge status: Home / Self Care | Payer: Medicare Other | Attending: Nurse Practitioner | Admitting: Nurse Practitioner

## 2023-01-20 ENCOUNTER — Ambulatory Visit: Payer: Self-pay

## 2023-01-20 ENCOUNTER — Ambulatory Visit
Admission: RE | Admit: 2023-01-20 | Discharge: 2023-01-20 | Disposition: A | Discharge status: Home / Self Care | Payer: Medicare Other | Source: Ambulatory Visit | Attending: Nurse Practitioner | Admitting: Nurse Practitioner

## 2023-01-20 ENCOUNTER — Encounter: Payer: Self-pay | Admitting: Nurse Practitioner

## 2023-01-20 VITALS — BP 118/64 | HR 68 | Temp 98.0°F | Resp 18 | Ht 69.02 in | Wt 206.8 lb

## 2023-01-20 DIAGNOSIS — U071 COVID-19: Secondary | ICD-10-CM | POA: Diagnosis not present

## 2023-01-20 DIAGNOSIS — R059 Cough, unspecified: Secondary | ICD-10-CM | POA: Diagnosis not present

## 2023-01-20 MED ORDER — ERYTHROMYCIN 5 MG/GM OP OINT: 1.0000 | TOPICAL_OINTMENT | Freq: Every day | OPHTHALMIC | 0 refills | Status: DC

## 2023-01-20 NOTE — Assessment & Plan Note (Signed)
Started treatment with Paxlovid on 01/15/23.  Had to hold Plavix during that time.  Still is not 100%, cough remains and fatigue + stye to left lower eye.  Order for CXR to ensure no PNA present post Covid, higher risk for this.  Recommend she reschedule her upcoming surgery out a little to ensure is feeling better prior to this + to ensure we can hold Plavix for less time due to recent CVA 07/02/22.  Erythromycin ointment sent for stye to left eye.  Recommend: - Increased rest - Increasing Fluids - Acetaminophen as needed for fever/pain.  - Salt water gargling, chloraseptic spray and throat lozenges - Mucinex.  - Humidifying the air.  - Restart Plavix at this time.  Will plan to treat with abx if PNA present.  Return in 2 weeks for lung check.

## 2023-01-20 NOTE — Telephone Encounter (Signed)
  Chief Complaint: Pt is unsure whether sh should restart Plavix or not. Also pt is still quite weak from COVID. Symptoms: above Frequency: Ongoing Pertinent Negatives: Patient denies  Disposition: [] ED /[] Urgent Care (no appt availability in office) / [x] Appointment(In office/virtual)/ []  [facility]/ [] Home Care/ [] Refused Recommended Disposition /[] [facility]/ []  Follow-up with PCP Additional Notes: Pt was advised to stop Plavix while taking paxlovid for COVID. PT has completed paxlovid. Pt is supposed to stop plavix today in preparation for Sx. PT has been unable to get directions regarding re-starting plavix or not.  (Pt has called Dr. Daisy Blossom office and was told that they would not make any recommendation.)  Additionally pt is still feeling very weak and has lingering cough from COVID. And is unsure if she should reschedule her sx.  Appt for this afternoon.     Summary: med ?   Pt called in says had to stop plavix while she was on the covid, and wants to know when she can start back on the plavix.     Reason for Disposition  [1] Caller has URGENT medicine question about med that PCP or specialist prescribed AND [2] triager unable to answer question  Answer Assessment - Initial Assessment Questions 1. NAME of MEDICINE: "What medicine(s) are you calling about?"     Plavix 2. QUESTION: "What is your question?" (e.g., double dose of medicine, side effect)     When should she stop this medication 3. PRESCRIBER: "Who prescribed the medicine?" Reason: if prescribed by specialist, call should be referred to that group.     2019 - Dr. Eddie North and Dr. Lenard Galloway 4. SYMPTOMS: "Do you have any symptoms?" If Yes, ask: "What symptoms are you having?"  "How bad are the symptoms (e.g., mild, moderate, severe)     none  Protocols used: Medication Question Call-A-AH

## 2023-01-20 NOTE — Telephone Encounter (Signed)
Patient has appt. This afternoon.

## 2023-01-20 NOTE — Progress Notes (Signed)
BP 118/64 (BP Location: Left Arm, Patient Position: Sitting, Cuff Size: Normal)   Pulse 68   Temp 98 F (36.7 C) (Oral)   Resp 18   Ht 5' 9.02" (1.753 m)   Wt 206 lb 12.8 oz (93.8 kg)   LMP  (LMP Unknown)   SpO2 97%   BMI 30.52 kg/m    Subjective:    Patient ID: Sharon [name], female    DOB: Jan 17, 1946, 77 y.o.   MRN: [id number]  HPI: CAMBRA ADWELL is a 77 y.o. female  Chief Complaint  Patient presents with   Covid    Here for follow up    Medication Assistance    Wants to know what to do with her Plavix    Stye   COVID Diagnosed on 01/14/23 via home test and started on Paxlovid, had to hold Plavix during time of taking this.  Last CVA was 07/02/22.  She is scheduled for cholecystectomy on 01/28/23 and would need to hold Plavix for this as well -- she is concerned with holding Plavix for this long due to recent CVA.  Have recommended she look into rescheduling surgery to when overall feeling better and able to hold Plavix for shorter period of time -- has currently been off Plavix since the 4th of April.    She currently continues to not feel well post-Covid -- low energy and cough remains + nausea.  Has stye presenting to left eye since being sick. Fever: no Cough: yes Shortness of breath: no Wheezing: occasional Chest pain: no Chest tightness: no Chest congestion: yes Nasal congestion: no Runny nose: no Post nasal drip: yes Sneezing: no Sore throat: yes -- improving Swollen glands: no Sinus pressure: yes Headache: yes Face pain: yes Toothache: no Ear pain: none Ear pressure: none Eyes red/itching:no Eye drainage/crusting: no  Vomiting:  occasional nausea Rash: no Fatigue: yes Sick contacts: no Strep contacts: no  Context: fluctuating Recurrent sinusitis: no Relief with OTC cold/cough medications: yes  Treatments attempted: Paxlovid    Relevant past medical, surgical, family and social history reviewed and updated as indicated. Interim medical history  since our last visit reviewed. Allergies and medications reviewed and updated.  Review of Systems  Constitutional:  Positive for fatigue. Negative for activity change, appetite change, chills and fever.  HENT:  Positive for postnasal drip, sinus pressure and sore throat. Negative for congestion, ear discharge, ear pain, facial swelling, rhinorrhea, sinus pain, sneezing and voice change.   Eyes:  Negative for pain and visual disturbance.  Respiratory:  Positive for cough and chest tightness. Negative for shortness of breath and wheezing.   Cardiovascular:  Negative for chest pain, palpitations and leg swelling.  Gastrointestinal:  Negative for abdominal distention, abdominal pain, constipation, diarrhea, nausea and vomiting.  Endocrine: Negative.   Musculoskeletal:  Negative for myalgias.  Neurological:  Positive for headaches. Negative for dizziness and numbness.  Psychiatric/Behavioral: Negative.      Per HPI unless specifically indicated above     Objective:    BP 118/64 (BP Location: Left Arm, Patient Position: Sitting, Cuff Size: Normal)   Pulse 68   Temp 98 F (36.7 C) (Oral)   Resp 18   Ht 5' 9.02" (1.753 m)   Wt 206 lb 12.8 oz (93.8 kg)   LMP  (LMP Unknown)   SpO2 97%   BMI 30.52 kg/m   Wt Readings from Last 3 Encounters:  01/20/23 206 lb 12.8 oz (93.8 kg)  01/13/23 209 lb 1.6 oz (94.8  kg)  12/02/22 215 lb 8 oz (97.8 kg)    Physical Exam Vitals and nursing note reviewed.  Constitutional:      General: She is awake. She is not in acute distress.    Appearance: She is well-developed and well-groomed. She is not ill-appearing or toxic-appearing.  HENT:     Head: Normocephalic.     Right Ear: Hearing, tympanic membrane, ear canal and external ear normal.     Left Ear: Hearing, tympanic membrane, ear canal and external ear normal.     Nose: No rhinorrhea.     Right Sinus: No maxillary sinus tenderness or frontal sinus tenderness.     Left Sinus: No maxillary sinus  tenderness or frontal sinus tenderness.     Mouth/Throat:     Mouth: Mucous membranes are moist.     Pharynx: Posterior oropharyngeal erythema (mild) present. No pharyngeal swelling or oropharyngeal exudate.  Eyes:     General: Lids are normal.        Right eye: No discharge or hordeolum.        Left eye: Hordeolum (to inner lower lid) present.No discharge.     Extraocular Movements: Extraocular movements intact.     Conjunctiva/sclera: Conjunctivae normal.     Visual Fields: Right eye visual fields normal and left eye visual fields normal.  Cardiovascular:     Rate and Rhythm: Normal rate and regular rhythm.     Heart sounds: No murmur heard.    No gallop.  Pulmonary:     Effort: Pulmonary effort is normal. No accessory muscle usage or respiratory distress.     Breath sounds: No decreased breath sounds, wheezing or rhonchi.  Musculoskeletal:     Cervical back: Normal range of motion.     Right lower leg: 2+ Edema present.     Left lower leg: 2+ Edema present.  Skin:    Findings: Bruising (mild noted to upper extremities.) present.  Neurological:     Mental Status: She is alert and oriented to person, place, and time.  Psychiatric:        Attention and Perception: Attention normal.        Mood and Affect: Mood normal.        Behavior: Behavior normal. Behavior is cooperative.        Thought Content: Thought content normal.        Judgment: Judgment normal.    Results for orders placed or performed in visit on 01/13/23  Bayer DCA Hb A1c Waived  Result Value Ref Range   HB A1C (BAYER DCA - WAIVED) 6.5 (H) 4.8 - 5.6 %  Microalbumin, Urine Waived  Result Value Ref Range   Microalb, Ur Waived 30 (H) 0 - 19 mg/L   Creatinine, Urine Waived 100 10 - 300 mg/dL   Microalb/Creat Ratio <30 <30 mg/g  CBC with Differential/Platelet  Result Value Ref Range   WBC 8.1 3.4 - 10.8 x10E3/uL   RBC 4.71 3.77 - 5.28 x10E6/uL   Hemoglobin 13.9 11.1 - 15.9 g/dL   Hematocrit 16.143.1 09.634.0 - 46.6 %    MCV 92 79 - 97 fL   MCH 29.5 26.6 - 33.0 pg   MCHC 32.3 31.5 - 35.7 g/dL   RDW 04.512.9 40.911.7 - 81.115.4 %   Platelets 180 150 - 450 x10E3/uL   Neutrophils 70 Not Estab. %   Lymphs 16 Not Estab. %   Monocytes 14 Not Estab. %   Eos 0 Not Estab. %   Basos 0 Not  Estab. %   Neutrophils Absolute 5.7 1.4 - 7.0 x10E3/uL   Lymphocytes Absolute 1.3 0.7 - 3.1 x10E3/uL   Monocytes Absolute 1.1 (H) 0.1 - 0.9 x10E3/uL   EOS (ABSOLUTE) 0.0 0.0 - 0.4 x10E3/uL   Basophils Absolute 0.0 0.0 - 0.2 x10E3/uL   Immature Granulocytes 0 Not Estab. %   Immature Grans (Abs) 0.0 0.0 - 0.1 x10E3/uL  Comprehensive metabolic panel  Result Value Ref Range   Glucose 105 (H) 70 - 99 mg/dL   BUN 14 8 - 27 mg/dL   Creatinine, Ser 8.98 0.57 - 1.00 mg/dL   eGFR 58 (L) >42 JI/ZXY/8.11   BUN/Creatinine Ratio 14 12 - 28   Sodium 139 134 - 144 mmol/L   Potassium 4.2 3.5 - 5.2 mmol/L   Chloride 101 96 - 106 mmol/L   CO2 25 20 - 29 mmol/L   Calcium 8.9 8.7 - 10.3 mg/dL   Total Protein 6.3 6.0 - 8.5 g/dL   Albumin 4.1 3.8 - 4.8 g/dL   Globulin, Total 2.2 1.5 - 4.5 g/dL   Albumin/Globulin Ratio 1.9 1.2 - 2.2   Bilirubin Total 0.5 0.0 - 1.2 mg/dL   Alkaline Phosphatase 85 44 - 121 IU/L   AST 19 0 - 40 IU/L   ALT 16 0 - 32 IU/L  Lipid Panel w/o Chol/HDL Ratio  Result Value Ref Range   Cholesterol, Total 111 100 - 199 mg/dL   Triglycerides 82 0 - 149 mg/dL   HDL 44 >88 mg/dL   VLDL Cholesterol Cal 16 5 - 40 mg/dL   LDL Chol Calc (NIH) 51 0 - 99 mg/dL  TSH  Result Value Ref Range   TSH 0.658 0.450 - 4.500 uIU/mL  Vitamin B12  Result Value Ref Range   Vitamin B-12 1,097 232 - 1,245 pg/mL  VITAMIN D 25 Hydroxy (Vit-D Deficiency, Fractures)  Result Value Ref Range   Vit D, 25-Hydroxy 101.0 (H) 30.0 - 100.0 ng/mL   *Note: Due to a large number of results and/or encounters for the requested time period, some results have not been displayed. A complete set of results can be found in Results Review.      Assessment &  Plan:   Problem List Items Addressed This Visit       Other   COVID-19 - Primary    Started treatment with Paxlovid on 01/15/23.  Had to hold Plavix during that time.  Still is not 100%, cough remains and fatigue + stye to left lower eye.  Order for CXR to ensure no PNA present post Covid, higher risk for this.  Recommend she reschedule her upcoming surgery out a little to ensure is feeling better prior to this + to ensure we can hold Plavix for less time due to recent CVA 07/02/22.  Erythromycin ointment sent for stye to left eye.  Recommend: - Increased rest - Increasing Fluids - Acetaminophen as needed for fever/pain.  - Salt water gargling, chloraseptic spray and throat lozenges - Mucinex.  - Humidifying the air.  - Restart Plavix at this time.  Will plan to treat with abx if PNA present.  Return in 2 weeks for lung check.      Relevant Orders   DG Chest 2 View     Follow up plan: Return in about 2 weeks (around 02/03/2023) for COVID.

## 2023-01-20 NOTE — Patient Instructions (Signed)
COVID-19 COVID-19 is an infection caused by a virus called SARS-CoV-2. Most people who get COVID-19 have mild to moderate symptoms. Some have little to no symptoms. In others, the virus may cause a severe infection. What are the causes? COVID-19 is caused by a coronavirus. The virus may be in the air as droplets or as tiny specks of fluid (aerosols). It may also be on surfaces. You may catch the virus if you: Breathe in droplets when a person with COVID-19 breathes, speaks, sings, coughs, or sneezes. Touch something that has the virus on it and then touch your mouth, nose, or eyes. What increases the risk? Risk for infection: You are more likely to get COVID-19 if: You are within 6 ft (1.8 m) of a person who has COVID-19 for 15 minutes or longer. You provide care to a person who has COVID-19. You are in close contact with others. This includes hugging, kissing, or sharing utensils. Risk for serious illness caused by COVID-19: You are more likely to get very ill from COVID-19 if: You have cancer. You have a long-term (chronic) disease. This may be: A chronic lung disease, such as pulmonary embolism, chronic obstructive pulmonary disease (COPD), or cystic fibrosis. A disease that affects your body's defense system (immune system). If you have a weak immune system, you are said to be immunocompromised. A serious heart condition, such as heart failure, coronary artery disease, or cardiomyopathy. Diabetes. Chronic kidney disease. A liver disease, such as cirrhosis, nonalcoholic fatty liver disease, alcoholic liver disease, or autoimmune hepatitis. You are obese. You are pregnant or were just pregnant. You have sickle cell disease. What are the signs or symptoms? Symptoms of COVID-19 can range from mild to severe. They may appear any time from 2 to 14 days after you are exposed. They include: Fever or chills. Shortness of breath or trouble breathing. Feeling tired. Headaches, body aches, or  muscle aches. A runny or stuffy nose. Sneezing, coughing, or a sore throat. New loss of taste or smell. You may also have stomach problems, such as nausea, vomiting, or diarrhea. In some cases, you may not have any symptoms. How is this diagnosed? COVID-19 may be diagnosed by testing a sample to check for the virus. The most common tests are the PCR test and the antigen test. Tests may be done in the lab or at home. They include: Using a swab to take a sample of fluid from your nose. Testing a sample of saliva from your mouth. Testing a sample of mucus from your lungs (sputum). How is this treated? Treatment for COVID-19 depends on how severe your condition is. Mild symptoms can be treated at home. You should rest, drink fluids, and take over-the-counter medicine. If you have symptoms and risk factors, you may be prescribed a medicine that fights viruses (antiviral). Severe symptoms may be treated in a hospital intensive care unit (ICU). Treatment may include: Extra oxygen given through a tube in the nose, a face mask, or a hood. Medicines. These may include: Antivirals, such as remdesivir. Anti-inflammatories, such as corticosteroids. These help reduce inflammation. Antithrombotics. These help prevent or treat blood clots. Convalescent plasma. This helps boost your immune system. Prone positioning. This is when you are laid on your stomach to help oxygen get into your lungs. Infection control measures. If you are at risk for a more serious illness, your health care provider may prescribe two medicines to help your immune system protect you. These are called long-acting monoclonal antibodies. They are given together   every 6 months. How is this prevented? To protect yourself: Get the vaccine or vaccine series if you meet the guidelines. You can even get the vaccine while you are pregnant or making breast milk (lactating). Get an added dose of the vaccine if you are immunocompromised. This  applies if you have had an organ transplant or if you have a condition that affects your immune system. You should get the added dose 4 weeks after you got the first one. If you get an mRNA vaccine, you will need to get 3 doses. Talk to your provider about getting experimental monoclonal antibodies. This treatment can help prevent severe illness. It may be given to you if: You are immunocompromised. You cannot get the vaccine. You may not get the vaccine if you have a severe allergic reaction to it or to what it is made of. You are not fully vaccinated. You are in a place where there is COVID-19 and: You are in close contact with someone who has COVID-19. You are at high risk of being exposed. You are at risk of illness from new variants of the virus. To protect others: If you have symptoms of COVID-19, take steps to stop the virus from spreading. Stay home. Leave your house only to get medical care. Do not use public transit. Do not travel while you are sick. Wash your hands often with soap and water for at least 20 seconds. If soap and water are not available, use alcohol-based hand sanitizer. Make sure that all people in your household wash their hands well and often. Cough or sneeze into a tissue or your sleeve or elbow. Do not cough or sneeze into your hand or into the air. Where to find more information Centers for Disease Control and Prevention (CDC): cdc.gov World Health Organization (WHO): who.int Get help right away if: You have trouble breathing. You have pain or pressure in your chest. You are confused. Your lips or fingernails turn blue. You have trouble waking from sleep. Your symptoms get worse. These symptoms may be an emergency. Get help right away. Call 911. Do not wait to see if the symptoms will go away. Do not drive yourself to the hospital. This information is not intended to replace advice given to you by your health care provider. Make sure you discuss any  questions you have with your health care provider. Document Revised: 06/13/2022 Document Reviewed: 06/13/2022 Elsevier Patient Education  2023 [employer]  

## 2023-01-21 NOTE — Progress Notes (Signed)
Contacted via MyChart   Good afternoon Gerrica, your imaging has returned and no current pneumonia is seen, but there is a little infection still remaining, most likely from Covid -- however if you have Augmentin left I recommend you restart and complete this to be on safe side since you are still not 100%.  Any questions on this? Keep being amazing!!  Thank you for allowing me to participate in your care.  I appreciate you. Kindest regards, Giavana Rooke

## 2023-01-26 ENCOUNTER — Other Ambulatory Visit: Payer: Medicare Other

## 2023-01-26 ENCOUNTER — Encounter: Payer: Self-pay | Admitting: Nurse Practitioner

## 2023-01-26 MED ORDER — ERYTHROMYCIN 5 MG/GM OP OINT: 1.0000 | TOPICAL_OINTMENT | Freq: Every day | OPHTHALMIC | 0 refills | Status: AC

## 2023-01-30 ENCOUNTER — Ambulatory Visit (INDEPENDENT_AMBULATORY_CARE_PROVIDER_SITE_OTHER): Payer: Medicare Other

## 2023-01-30 DIAGNOSIS — E538 Deficiency of other specified B group vitamins: Secondary | ICD-10-CM

## 2023-01-30 MED ORDER — CYANOCOBALAMIN 1000 MCG/ML IJ SOLN
1000.0000 ug | Freq: Once | INTRAMUSCULAR | Status: AC
Start: 2023-01-30 — End: 2023-01-30
  Administered 2023-01-30: 1000 ug via INTRAMUSCULAR

## 2023-02-08 NOTE — Patient Instructions (Signed)
COVID-19 COVID-19 is an infection caused by a virus called SARS-CoV-2. Most people who get COVID-19 have mild to moderate symptoms. Some have little to no symptoms. In others, the virus may cause a severe infection. What are the causes? COVID-19 is caused by a coronavirus. The virus may be in the air as droplets or as tiny specks of fluid (aerosols). It may also be on surfaces. You may catch the virus if you: Breathe in droplets when a person with COVID-19 breathes, speaks, sings, coughs, or sneezes. Touch something that has the virus on it and then touch your mouth, nose, or eyes. What increases the risk? Risk for infection: You are more likely to get COVID-19 if: You are within 6 ft (1.8 m) of a person who has COVID-19 for 15 minutes or longer. You provide care to a person who has COVID-19. You are in close contact with others. This includes hugging, kissing, or sharing utensils. Risk for serious illness caused by COVID-19: You are more likely to get very ill from COVID-19 if: You have cancer. You have a long-term (chronic) disease. This may be: A chronic lung disease, such as pulmonary embolism, chronic obstructive pulmonary disease (COPD), or cystic fibrosis. A disease that affects your body's defense system (immune system). If you have a weak immune system, you are said to be immunocompromised. A serious heart condition, such as heart failure, coronary artery disease, or cardiomyopathy. Diabetes. Chronic kidney disease. A liver disease, such as cirrhosis, nonalcoholic fatty liver disease, alcoholic liver disease, or autoimmune hepatitis. You are obese. You are pregnant or were just pregnant. You have sickle cell disease. What are the signs or symptoms? Symptoms of COVID-19 can range from mild to severe. They may appear any time from 2 to 14 days after you are exposed. They include: Fever or chills. Shortness of breath or trouble breathing. Feeling tired. Headaches, body aches, or  muscle aches. A runny or stuffy nose. Sneezing, coughing, or a sore throat. New loss of taste or smell. You may also have stomach problems, such as nausea, vomiting, or diarrhea. In some cases, you may not have any symptoms. How is this diagnosed? COVID-19 may be diagnosed by testing a sample to check for the virus. The most common tests are the PCR test and the antigen test. Tests may be done in the lab or at home. They include: Using a swab to take a sample of fluid from your nose. Testing a sample of saliva from your mouth. Testing a sample of mucus from your lungs (sputum). How is this treated? Treatment for COVID-19 depends on how severe your condition is. Mild symptoms can be treated at home. You should rest, drink fluids, and take over-the-counter medicine. If you have symptoms and risk factors, you may be prescribed a medicine that fights viruses (antiviral). Severe symptoms may be treated in a hospital intensive care unit (ICU). Treatment may include: Extra oxygen given through a tube in the nose, a face mask, or a hood. Medicines. These may include: Antivirals, such as remdesivir. Anti-inflammatories, such as corticosteroids. These help reduce inflammation. Antithrombotics. These help prevent or treat blood clots. Convalescent plasma. This helps boost your immune system. Prone positioning. This is when you are laid on your stomach to help oxygen get into your lungs. Infection control measures. If you are at risk for a more serious illness, your health care provider may prescribe two medicines to help your immune system protect you. These are called long-acting monoclonal antibodies. They are given together   every 6 months. How is this prevented? To protect yourself: Get the vaccine or vaccine series if you meet the guidelines. You can even get the vaccine while you are pregnant or making breast milk (lactating). Get an added dose of the vaccine if you are immunocompromised. This  applies if you have had an organ transplant or if you have a condition that affects your immune system. You should get the added dose 4 weeks after you got the first one. If you get an mRNA vaccine, you will need to get 3 doses. Talk to your provider about getting experimental monoclonal antibodies. This treatment can help prevent severe illness. It may be given to you if: You are immunocompromised. You cannot get the vaccine. You may not get the vaccine if you have a severe allergic reaction to it or to what it is made of. You are not fully vaccinated. You are in a place where there is COVID-19 and: You are in close contact with someone who has COVID-19. You are at high risk of being exposed. You are at risk of illness from new variants of the virus. To protect others: If you have symptoms of COVID-19, take steps to stop the virus from spreading. Stay home. Leave your house only to get medical care. Do not use public transit. Do not travel while you are sick. Wash your hands often with soap and water for at least 20 seconds. If soap and water are not available, use alcohol-based hand sanitizer. Make sure that all people in your household wash their hands well and often. Cough or sneeze into a tissue or your sleeve or elbow. Do not cough or sneeze into your hand or into the air. Where to find more information Centers for Disease Control and Prevention (CDC): cdc.gov World Health Organization (WHO): who.int Get help right away if: You have trouble breathing. You have pain or pressure in your chest. You are confused. Your lips or fingernails turn blue. You have trouble waking from sleep. Your symptoms get worse. These symptoms may be an emergency. Get help right away. Call 911. Do not wait to see if the symptoms will go away. Do not drive yourself to the hospital. This information is not intended to replace advice given to you by your health care provider. Make sure you discuss any  questions you have with your health care provider. Document Revised: 06/13/2022 Document Reviewed: 06/13/2022 Elsevier Patient Education  2023 [employer]  

## 2023-02-10 ENCOUNTER — Ambulatory Visit (INDEPENDENT_AMBULATORY_CARE_PROVIDER_SITE_OTHER): Payer: Medicare Other | Admitting: Nurse Practitioner

## 2023-02-10 ENCOUNTER — Encounter: Payer: Self-pay | Admitting: Nurse Practitioner

## 2023-02-10 VITALS — BP 111/74 | HR 87 | Temp 97.9°F | Ht 69.02 in | Wt 205.0 lb

## 2023-02-10 DIAGNOSIS — U071 COVID-19: Secondary | ICD-10-CM | POA: Diagnosis not present

## 2023-02-10 NOTE — Assessment & Plan Note (Signed)
Acute and improved at this time.  Discussed ongoing fatigue and recommend increase fluid intake and multivitamin daily.  Educated on Covid and period it takes for full recovery.

## 2023-02-10 NOTE — Progress Notes (Signed)
BP 111/74   Pulse 87   Temp 97.9 F (36.6 C) (Oral)   Ht 5' 9.02" (1.753 m)   Wt 205 lb (93 kg)   LMP  (LMP Unknown)   SpO2 97%   BMI 30.26 kg/m    Subjective:    Patient ID: Sharon [name], female    DOB: 1945-12-08, 77 y.o.   MRN: [id number]  HPI: Sharon [name] is a 77 y.o. female  Chief Complaint  Patient presents with   Covid    Here for follow up Still has a little cough and drainage   COVID Diagnosed on 01/14/23 via home test and started on Paxlovid, had to hold Plavix during time of taking this.  Last CVA was 07/02/22.  Was provided erythromycin at visit on 01/20/23 for stye to eye post Covid, this is finally going away.  Imaging at recent visit showed no PNA post Covid.  She reports very slowly is improving, some drainage remains and cough.  Continues to have low energy.   Fever: no Cough: yes Shortness of breath: no Wheezing: no Chest pain: no Chest tightness: no Chest congestion: no Nasal congestion: no Runny nose: yes Post nasal drip: yes Sneezing: no Sore throat: no Swollen glands: no Sinus pressure: no Headache: no Face pain: no Toothache: no Ear pain: no Ear pressure: no Eyes red/itching:no Eye drainage/crusting: no  Vomiting: no Rash: no Fatigue: yes Sick contacts: no Strep contacts: no  Context: improving Recurrent sinusitis: no Relief with OTC cold/cough medications: yes  Treatments attempted: Paxlovid    Relevant past medical, surgical, family and social history reviewed and updated as indicated. Interim medical history since our last visit reviewed. Allergies and medications reviewed and updated.  Review of Systems  Constitutional:  Positive for fatigue. Negative for activity change, appetite change, chills and fever.  HENT: Negative.    Eyes:  Negative for pain and visual disturbance.  Respiratory:  Negative for cough, chest tightness, shortness of breath and wheezing.   Cardiovascular:  Negative for chest pain, palpitations and leg  swelling.  Gastrointestinal:  Negative for abdominal distention, abdominal pain, constipation, diarrhea, nausea and vomiting.  Endocrine: Negative.   Musculoskeletal:  Negative for myalgias.  Neurological:  Negative for dizziness, weakness, numbness and headaches.  Psychiatric/Behavioral: Negative.      Per HPI unless specifically indicated above     Objective:    BP 111/74   Pulse 87   Temp 97.9 F (36.6 C) (Oral)   Ht 5' 9.02" (1.753 m)   Wt 205 lb (93 kg)   LMP  (LMP Unknown)   SpO2 97%   BMI 30.26 kg/m   Wt Readings from Last 3 Encounters:  02/10/23 205 lb (93 kg)  01/20/23 206 lb 12.8 oz (93.8 kg)  01/13/23 209 lb 1.6 oz (94.8 kg)    Physical Exam Vitals and nursing note reviewed.  Constitutional:      General: She is awake. She is not in acute distress.    Appearance: She is well-developed and well-groomed. She is not ill-appearing or toxic-appearing.  HENT:     Head: Normocephalic.     Right Ear: Hearing, tympanic membrane, ear canal and external ear normal.     Left Ear: Hearing, tympanic membrane, ear canal and external ear normal.     Nose: No rhinorrhea.     Right Sinus: No maxillary sinus tenderness or frontal sinus tenderness.     Left Sinus: No maxillary sinus tenderness or frontal sinus tenderness.  Mouth/Throat:     Mouth: Mucous membranes are moist.     Pharynx: No pharyngeal swelling, oropharyngeal exudate or posterior oropharyngeal erythema.  Eyes:     General: Lids are normal.        Right eye: No discharge or hordeolum.        Left eye: No discharge or hordeolum.     Extraocular Movements: Extraocular movements intact.     Conjunctiva/sclera: Conjunctivae normal.     Visual Fields: Right eye visual fields normal and left eye visual fields normal.  Cardiovascular:     Rate and Rhythm: Normal rate and regular rhythm.     Heart sounds: No murmur heard.    No gallop.  Pulmonary:     Effort: Pulmonary effort is normal. No accessory muscle  usage or respiratory distress.     Breath sounds: No decreased breath sounds, wheezing or rhonchi.  Musculoskeletal:     Cervical back: Normal range of motion.     Right lower leg: 1+ Edema present.     Left lower leg: 1+ Edema present.  Skin:    Findings: Bruising (mild noted to upper extremities.) present.  Neurological:     Mental Status: She is alert and oriented to person, place, and time.  Psychiatric:        Attention and Perception: Attention normal.        Mood and Affect: Mood normal.        Behavior: Behavior normal. Behavior is cooperative.        Thought Content: Thought content normal.        Judgment: Judgment normal.    Results for orders placed or performed in visit on 01/13/23  Bayer DCA Hb A1c Waived  Result Value Ref Range   HB A1C (BAYER DCA - WAIVED) 6.5 (H) 4.8 - 5.6 %  Microalbumin, Urine Waived  Result Value Ref Range   Microalb, Ur Waived 30 (H) 0 - 19 mg/L   Creatinine, Urine Waived 100 10 - 300 mg/dL   Microalb/Creat Ratio <30 <30 mg/g  CBC with Differential/Platelet  Result Value Ref Range   WBC 8.1 3.4 - 10.8 x10E3/uL   RBC 4.71 3.77 - 5.28 x10E6/uL   Hemoglobin 13.9 11.1 - 15.9 g/dL   Hematocrit 53.6 64.4 - 46.6 %   MCV 92 79 - 97 fL   MCH 29.5 26.6 - 33.0 pg   MCHC 32.3 31.5 - 35.7 g/dL   RDW 03.4 74.2 - 59.5 %   Platelets 180 150 - 450 x10E3/uL   Neutrophils 70 Not Estab. %   Lymphs 16 Not Estab. %   Monocytes 14 Not Estab. %   Eos 0 Not Estab. %   Basos 0 Not Estab. %   Neutrophils Absolute 5.7 1.4 - 7.0 x10E3/uL   Lymphocytes Absolute 1.3 0.7 - 3.1 x10E3/uL   Monocytes Absolute 1.1 (H) 0.1 - 0.9 x10E3/uL   EOS (ABSOLUTE) 0.0 0.0 - 0.4 x10E3/uL   Basophils Absolute 0.0 0.0 - 0.2 x10E3/uL   Immature Granulocytes 0 Not Estab. %   Immature Grans (Abs) 0.0 0.0 - 0.1 x10E3/uL  Comprehensive metabolic panel  Result Value Ref Range   Glucose 105 (H) 70 - 99 mg/dL   BUN 14 8 - 27 mg/dL   Creatinine, Ser 6.38 0.57 - 1.00 mg/dL   eGFR 58  (L) >75 IE/PPI/9.51   BUN/Creatinine Ratio 14 12 - 28   Sodium 139 134 - 144 mmol/L   Potassium 4.2 3.5 - 5.2 mmol/L  Chloride 101 96 - 106 mmol/L   CO2 25 20 - 29 mmol/L   Calcium 8.9 8.7 - 10.3 mg/dL   Total Protein 6.3 6.0 - 8.5 g/dL   Albumin 4.1 3.8 - 4.8 g/dL   Globulin, Total 2.2 1.5 - 4.5 g/dL   Albumin/Globulin Ratio 1.9 1.2 - 2.2   Bilirubin Total 0.5 0.0 - 1.2 mg/dL   Alkaline Phosphatase 85 44 - 121 IU/L   AST 19 0 - 40 IU/L   ALT 16 0 - 32 IU/L  Lipid Panel w/o Chol/HDL Ratio  Result Value Ref Range   Cholesterol, Total 111 100 - 199 mg/dL   Triglycerides 82 0 - 149 mg/dL   HDL 44 >13 mg/dL   VLDL Cholesterol Cal 16 5 - 40 mg/dL   LDL Chol Calc (NIH) 51 0 - 99 mg/dL  TSH  Result Value Ref Range   TSH 0.658 0.450 - 4.500 uIU/mL  Vitamin B12  Result Value Ref Range   Vitamin B-12 1,097 232 - 1,245 pg/mL  VITAMIN D 25 Hydroxy (Vit-D Deficiency, Fractures)  Result Value Ref Range   Vit D, 25-Hydroxy 101.0 (H) 30.0 - 100.0 ng/mL   *Note: Due to a large number of results and/or encounters for the requested time period, some results have not been displayed. A complete set of results can be found in Results Review.      Assessment & Plan:   Problem List Items Addressed This Visit       Other   COVID-19 - Primary    Acute and improved at this time.  Discussed ongoing fatigue and recommend increase fluid intake and multivitamin daily.  Educated on Covid and period it takes for full recovery.        Follow up plan: Return for should be scheduled for 04/14/23 or around there for 3 month follow-up.

## 2023-02-12 ENCOUNTER — Other Ambulatory Visit: Payer: Self-pay

## 2023-02-12 ENCOUNTER — Encounter
Admission: RE | Admit: 2023-02-12 | Discharge: 2023-02-12 | Disposition: A | Discharge status: Home / Self Care | Payer: Medicare Other | Source: Ambulatory Visit | Attending: General Surgery | Admitting: General Surgery

## 2023-02-12 HISTORY — DX: COVID-19: U07.1

## 2023-02-12 HISTORY — DX: Malignant neoplasm of cervix uteri, unspecified: C53.9

## 2023-02-12 HISTORY — DX: Prediabetes: R73.03

## 2023-02-12 NOTE — Patient Instructions (Signed)
Your procedure is scheduled on: Wednesday 02/18/23 To find out your arrival time, please call [phone] between 1PM - 3PM on:   Tuesday 02/17/23 Report to the Registration Desk on the 1st floor of the [facility]. Valet parking is available.  If your arrival time is 6:00 am, do not arrive before that time as the [facility] entrance doors do not open until 6:00 am.  REMEMBER: Instructions that are not followed completely may result in serious medical risk, up to and including death; or upon the discretion of your surgeon and anesthesiologist your surgery may need to be rescheduled.  Do not eat food or drink any liquids after midnight the night before surgery.  No gum chewing or hard candies.  One week prior to surgery: Stop Anti-inflammatories (NSAIDS) such as Advil, Aleve, Ibuprofen, Motrin, Naproxen, Naprosyn and Aspirin based products such as Excedrin, Goody's Powder, BC Powder. You may however, continue to take Tylenol if needed for pain up until the day of surgery.  Stop ANY OVER THE COUNTER supplements until after surgery.  Continue taking all prescribed medications with the exception of the following: Aspirin and Plavix as you were previously instructed.  TAKE ONLY THESE MEDICATIONS THE MORNING OF SURGERY WITH A SIP OF WATER:  pantoprazole (PROTONIX) 40 MG tablet Antacid (take one the night before and one on the morning of surgery - helps to prevent nausea after surgery.) rosuvastatin (CRESTOR) 5 MG tablet  citalopram (CELEXA) 20 MG tablet if needed   Use inhalers on the day of surgery and bring to the hospital.  Fleets enema or bowel prep as directed.  No Alcohol for 24 hours before or after surgery.  No Smoking including e-cigarettes for 24 hours before surgery.  No chewable tobacco products for at least 6 hours before surgery.  No nicotine patches on the day of surgery.  Do not use any "recreational" drugs for at least a week (preferably 2 weeks) before your surgery.   Please be advised that the combination of cocaine and anesthesia may have negative outcomes, up to and including death. If you test positive for cocaine, your surgery will be cancelled.  On the morning of surgery brush your teeth with toothpaste and water, you may rinse your mouth with mouthwash if you wish. Do not swallow any toothpaste or mouthwash.  Use CHG Soap or wipes as directed on instruction sheet.  Do not wear lotions, powders, or perfumes.   Do not shave body hair from the neck down 48 hours before surgery.  Wear comfortable clothing (specific to your surgery type) to the hospital.  Do not wear jewelry, make-up, hairpins, clips or nail polish.  Contact lenses, hearing aids and dentures may not be worn into surgery.  Do not bring valuables to the hospital. [facility] is not responsible for any missing/lost belongings or valuables.   Notify your doctor if there is any change in your medical condition (cold, fever, infection).  If you are being discharged the day of surgery, you will not be allowed to drive home. You will need a responsible individual to drive you home and stay with you for 24 hours after surgery.   If you are taking public transportation, you will need to have a responsible individual with you.  If you are being admitted to the hospital overnight, leave your suitcase in the car. After surgery it may be brought to your room.  In case of increased patient census, it may be necessary for you, the patient, to continue  your postoperative care in the Same Day Surgery department.  After surgery, you can help prevent lung complications by doing breathing exercises.  Take deep breaths and cough every 1-2 hours. Your doctor may order a device called an Incentive Spirometer to help you take deep breaths. When coughing or sneezing, hold a pillow firmly against your incision with both hands. This is called "splinting." Doing this helps protect your incision. It also  decreases belly discomfort.  Surgery Visitation Policy:  Patients undergoing a surgery or procedure may have two family members or support persons with them as long as the person is not COVID-19 positive or experiencing its symptoms.   Inpatient Visitation:    Visiting hours are 7 a.m. to 8 p.m. Up to four visitors are allowed at one time in a patient room. The visitors may rotate out with other people during the day. One designated support person (adult) may remain overnight.  Please call the Pre-admissions Testing Dept. at [phone] if you have any questions about these instructions.     Preparing for Surgery with CHLORHEXIDINE GLUCONATE (CHG) Soap  Chlorhexidine Gluconate (CHG) Soap  o An antiseptic cleaner that kills germs and bonds with the skin to continue killing germs even after washing  o Used for showering the night before surgery and morning of surgery  Before surgery, you can play an important role by reducing the number of germs on your skin.  CHG (Chlorhexidine gluconate) soap is an antiseptic cleanser which kills germs and bonds with the skin to continue killing germs even after washing.  Please do not use if you have an allergy to CHG or antibacterial soaps. If your skin becomes reddened/irritated stop using the CHG.  1. Shower the NIGHT BEFORE SURGERY and the MORNING OF SURGERY with CHG soap.  2. If you choose to wash your hair, wash your hair first as usual with your normal shampoo.  3. After shampooing, rinse your hair and body thoroughly to remove the shampoo.  4. Use CHG as you would any other liquid soap. You can apply CHG directly to the skin and wash gently with a scrungie or a clean washcloth.  5. Apply the CHG soap to your body only from the neck down. Do not use on open wounds or open sores. Avoid contact with your eyes, ears, mouth, and genitals (private parts). Wash face and genitals (private parts) with your normal soap.  6. Wash thoroughly,  paying special attention to the area where your surgery will be performed.  7. Thoroughly rinse your body with warm water.  8. Do not shower/wash with your normal soap after using and rinsing off the CHG soap.  9. Pat yourself dry with a clean towel.  10. Wear clean pajamas to bed the night before surgery.  12. Place clean sheets on your bed the night of your first shower and do not sleep with pets.  13. Shower again with the CHG soap on the day of surgery prior to arriving at the hospital.  14. Do not apply any deodorants/lotions/powders.  15. Please wear clean clothes to the hospital.

## 2023-02-13 ENCOUNTER — Telehealth: Payer: Self-pay

## 2023-02-13 NOTE — Progress Notes (Cosign Needed)
Care Management & Coordination Services Pharmacy Team  Reason for Encounter: General adherence update   Contacted patient for general health update and medication adherence call.  Spoke with patient on 02/25/2023    Recent office visits:  02/10/23-Jolene T. Harvest Dark, NP (PCP) Seen for COVID. Return for should be scheduled for 04/14/23 or around there for 3 month follow-up.  01/20/23-Jolene T. Harvest Dark, NP (PCP) Seen for general follow up. Chest x-ray ordered. Follow up in 2 weeks.  01/13/23-Jolene T. Harvest Dark, NP (PCP) General follow up visit. Labs ordered. Follow up in 3 months.   Recent consult visits:  12/25/22-Edgardo Jae Dire Diax, MD (General surgery) Seen for re evaluation of cholelithiasis with recent episode of cholecystitis.  03/0/24-Edgardo Jose Cintron Diax, MD (General surgery) Seen for initial consult for abdominal pain.  12/02/22-Steven C. Klein, MD (Cardiology) Seen for follow-up for cryptogenic stroke. 11/18/22-Ryan M. Shea Evans, PA-C (Cardiology) Seen for follow up visit.  10/08/22-Tara Roseanne Reno, MD (Dermatology) Seen for a follow up visit for a rash.  09/11/22-Zchary Adrian Saran, MD (Neurology) Seen for a stroke. Decrease aspirin 81 mg.  09/02/22-Fallon E. Manson Passey, NP (Vascular surgery) Seen for a 6 month follow up visit.  08/26/22-Jolene T. Harvest Dark, NP (PCP) Seen for hyperlipidemia and cerebrovascular accident. Labs ordered. Follow up in 6 weeks. 07/16/22-Jolene T. Harvest Dark, NP (PCP) Seen for a general follow up visit. Labs ordered. Ambulatory referral to Neurology. Follow up in 4 weeks. 06/27/22-Erin E. Mecum, PA-C. Seen for painful urination. Start on fosfomycin (MONUROL) 3 g PACK.   Hospital visits:  Medication Reconciliation was completed by comparing discharge summary, patient's EMR and Pharmacy list, and upon discussion with patient.  Admitted to the hospital on 12/15/22 due to Abdominal pain. Discharge date was 12/15/22. Discharged from [facility].     New?Medications Started at [facility] Discharge:?? -started none noted  Medication Changes at Hospital Discharge: -Changed none noted  Medications Discontinued at Hospital Discharge: -Stopped none noted  Medications that remain the same after Hospital Discharge:??  -All other medications will remain the same.     Medication Reconciliation was completed by comparing discharge summary, patient's EMR and Pharmacy list, and upon discussion with patient.   Admitted to the hospital on 07/02/22 due to Code stroke. Discharge date was 07/04/22. Discharged from [facility].     New?Medications Started at [facility] Discharge:?? -started none noted   Medication Changes at Hospital Discharge: -Changed none noted   Medications Discontinued at Hospital Discharge: -Stopped none noted   Medications that remain the same after Hospital Discharge:??  -All other medications will remain the same.     Medication Reconciliation was completed by comparing discharge summary, patient's EMR and Pharmacy list, and upon discussion with patient.   Admitted to the hospital on 06/26/22 due to Code stroke. Discharge date was 07/04/22. Discharged from [facility].     New?Medications Started at [facility] Discharge:?? -started none noted   Medication Changes at Hospital Discharge: -Changed none noted   Medications Discontinued at Hospital Discharge: -Stopped none noted   Medications that remain the same after Hospital Discharge:??  -All other medications will remain the same.  Medications: Outpatient Encounter Medications as of 02/13/2023  Medication Sig   acetaminophen (TYLENOL) 500 MG tablet Take 500 mg by mouth every 6 (six) hours as needed.   albuterol (PROAIR HFA) 108 (90 Base) MCG/ACT inhaler Inhale 2 puffs into the lungs every 6 (six) hours as needed for wheezing or shortness of breath.   aspirin EC 81 MG tablet Take 81  mg by mouth 3 (three) times a week. Swallow  whole. M-W-Fr   Blood Glucose Monitoring Suppl (ONETOUCH VERIO) w/Device KIT Utilize to check blood sugar twice a day, fasting in morning with goal < 130 and then 2 hours after a meal with goal <180.  Document and bring to visits.   BREZTRI AEROSPHERE 160-9-4.8 MCG/ACT AERO INHALE 2 PUFFS BY MOUTH TWICE DAILY AS DIRECTED   Cholecalciferol (VITAMIN D3) 50 MCG (2000 UT) CAPS Take 1 capsule by mouth daily.   citalopram (CELEXA) 20 MG tablet Take 1 tablet (20 mg total) by mouth as needed. (Patient taking differently: Take 20 mg by mouth as needed.)   clopidogrel (PLAVIX) 75 MG tablet Take 1 tablet (75 mg total) by mouth daily.   Crisaborole (EUCRISA) 2 % OINT Apply 1 application  topically daily. Use for itching and inflammation at left leg   Cyanocobalamin 1000 MCG/ML KIT Inject 1 vial as directed every 30 (thirty) days.   ezetimibe (ZETIA) 10 MG tablet Take 1 tablet (10 mg total) by mouth daily. (Patient taking differently: Take 10 mg by mouth at bedtime.)   glucose blood (ONETOUCH VERIO) test strip UTILIIZE TO CHECK BLOOD SUGAR TWICE DAILY FASTING IN MORNING WITH GOAL<130 AND THAN 2 HOURS AFTER A MEAL WITH GGOAL<180   Lancets (ONETOUCH ULTRASOFT) lancets Utilize to check blood sugar twice a day, fasting in morning with goal < 130 and then 2 hours after a meal with goal <180.  Document and bring to visits.   loratadine (CLARITIN) 10 MG tablet TAKE 1 TABLET BY MOUTH EVERY DAY (Patient taking differently: Take 10 mg by mouth at bedtime.)   pantoprazole (PROTONIX) 40 MG tablet TAKE 1 TABLET(40 MG) BY MOUTH DAILY   rosuvastatin (CRESTOR) 5 MG tablet Take 1 tablet (5 mg total) by mouth daily.   No facility-administered encounter medications on file as of 02/13/2023.    Recent vitals BP Readings from Last 3 Encounters:  02/10/23 111/74  01/20/23 118/64  01/13/23 108/63   Pulse Readings from Last 3 Encounters:  02/10/23 87  01/20/23 68  01/13/23 89   Wt Readings from Last 3 Encounters:  02/12/23  205 lb (93 kg)  02/10/23 205 lb (93 kg)  01/20/23 206 lb 12.8 oz (93.8 kg)   BMI Readings from Last 3 Encounters:  02/12/23 30.27 kg/m  02/10/23 30.26 kg/m  01/20/23 30.52 kg/m    Recent lab results    Component Value Date/Time   NA 139 01/13/2023 1441   K 4.2 01/13/2023 1441   CL 101 01/13/2023 1441   CO2 25 01/13/2023 1441   GLUCOSE 105 (H) 01/13/2023 1441   GLUCOSE 127 (H) 12/15/2022 1119   BUN 14 01/13/2023 1441   CREATININE 1.00 01/13/2023 1441   CALCIUM 8.9 01/13/2023 1441    Lab Results  Component Value Date   CREATININE 1.00 01/13/2023   EGFR 58 (L) 01/13/2023   GFRNONAA >60 12/15/2022   GFRAA 67 07/17/2020   Lab Results  Component Value Date/Time   HGBA1C 6.5 (H) 01/13/2023 02:39 PM   HGBA1C 6.4 (H) 07/03/2022 06:09 AM   HGBA1C 5.9 (H) 12/31/2021 01:55 PM   MICROALBUR 30 (H) 01/13/2023 02:39 PM   MICROALBUR 80 (H) 12/31/2021 01:55 PM    Lab Results  Component Value Date   CHOL 111 01/13/2023   HDL 44 01/13/2023   LDLCALC 51 01/13/2023   LDLDIRECT 142.3 (H) 03/21/2021   TRIG 82 01/13/2023   CHOLHDL 3.6 07/03/2022   What concerns do you have  about your medications? Patient states she recently had stopped taking all her mediations due to her recently having gallbladder surgery and has recently started back on taking her mediations again.  The patient denies side effects with their medications.   How often do you forget or accidentally miss a dose? Never  Do you use a pillbox? Yes  Are you having any problems getting your medications from your pharmacy? No  Has the cost of your medications been a concern? No If yes, what medication and is patient assistance available or has it been applied for?  Since last visit with PharmD, no interventions have been made.   The patient has had an ED visit since last contact.   The patient denies problems with their health.   Patient denies concerns or questions for Artelia Laroche, PharmD at this time.    Counseled patient on: Great job taking medications  Care Gaps: Annual wellness visit in last year? Yes  If Diabetic: Last eye exam / retinopathy screening:09/11/22 Last diabetic foot exam:01/15/22 Last UACR: N/a  Star Rating Drugs:  Rosuvastatin 5 mg Last filled:01/09/23 90 DS   Myriam Carolin Coy, RMA

## 2023-02-18 ENCOUNTER — Encounter: Payer: Self-pay | Admitting: General Surgery

## 2023-02-18 ENCOUNTER — Ambulatory Visit: Payer: Medicare Other | Admitting: Urgent Care

## 2023-02-18 ENCOUNTER — Ambulatory Visit
Admission: RE | Admit: 2023-02-18 | Discharge: 2023-02-18 | Disposition: A | Discharge status: Home / Self Care | Payer: Medicare Other | Attending: General Surgery | Admitting: General Surgery

## 2023-02-18 ENCOUNTER — Other Ambulatory Visit: Payer: Self-pay

## 2023-02-18 ENCOUNTER — Encounter
Admission: RE | Disposition: A | Discharge status: Home / Self Care | Payer: Self-pay | Source: Home / Self Care | Attending: General Surgery

## 2023-02-18 DIAGNOSIS — K8010 Calculus of gallbladder with chronic cholecystitis without obstruction: Secondary | ICD-10-CM | POA: Diagnosis not present

## 2023-02-18 DIAGNOSIS — R7303 Prediabetes: Secondary | ICD-10-CM | POA: Diagnosis not present

## 2023-02-18 DIAGNOSIS — I739 Peripheral vascular disease, unspecified: Secondary | ICD-10-CM | POA: Insufficient documentation

## 2023-02-18 DIAGNOSIS — E785 Hyperlipidemia, unspecified: Secondary | ICD-10-CM | POA: Diagnosis not present

## 2023-02-18 DIAGNOSIS — Z87891 Personal history of nicotine dependence: Secondary | ICD-10-CM | POA: Insufficient documentation

## 2023-02-18 DIAGNOSIS — J449 Chronic obstructive pulmonary disease, unspecified: Secondary | ICD-10-CM | POA: Diagnosis not present

## 2023-02-18 DIAGNOSIS — K219 Gastro-esophageal reflux disease without esophagitis: Secondary | ICD-10-CM | POA: Diagnosis not present

## 2023-02-18 DIAGNOSIS — I2510 Atherosclerotic heart disease of native coronary artery without angina pectoris: Secondary | ICD-10-CM | POA: Insufficient documentation

## 2023-02-18 DIAGNOSIS — Z8673 Personal history of transient ischemic attack (TIA), and cerebral infarction without residual deficits: Secondary | ICD-10-CM | POA: Insufficient documentation

## 2023-02-18 HISTORY — PX: CHOLECYSTECTOMY, LAPAROSCOPIC: SHX56

## 2023-02-18 SURGERY — CHOLECYSTECTOMY, ROBOT-ASSISTED, LAPAROSCOPIC
Anesthesia: General | Site: Abdomen

## 2023-02-18 MED ORDER — GLYCOPYRROLATE 0.2 MG/ML IJ SOLN
INTRAMUSCULAR | Status: DC | PRN
  Administered 2023-02-18: .2 mg via INTRAVENOUS

## 2023-02-18 MED ORDER — IPRATROPIUM-ALBUTEROL 0.5-2.5 (3) MG/3ML IN SOLN
RESPIRATORY_TRACT | Status: AC
  Filled 2023-02-18: qty 3

## 2023-02-18 MED ORDER — FENTANYL CITRATE (PF) 100 MCG/2ML IJ SOLN
INTRAMUSCULAR | Status: AC
  Filled 2023-02-18: qty 2

## 2023-02-18 MED ORDER — FENTANYL CITRATE (PF) 100 MCG/2ML IJ SOLN
25.0000 ug | INTRAMUSCULAR | Status: DC | PRN
  Administered 2023-02-18: 50 ug via INTRAVENOUS
  Administered 2023-02-18: 25 ug via INTRAVENOUS
  Administered 2023-02-18: 50 ug via INTRAVENOUS
  Administered 2023-02-18: 25 ug via INTRAVENOUS

## 2023-02-18 MED ORDER — LACTATED RINGERS IV SOLN: INTRAVENOUS | Status: DC

## 2023-02-18 MED ORDER — IPRATROPIUM-ALBUTEROL 0.5-2.5 (3) MG/3ML IN SOLN
3.0000 mL | Freq: Once | RESPIRATORY_TRACT | Status: AC
  Administered 2023-02-18: 3 mL via RESPIRATORY_TRACT

## 2023-02-18 MED ORDER — CHLORHEXIDINE GLUCONATE 0.12 % MT SOLN
OROMUCOSAL | Status: AC
  Filled 2023-02-18: qty 15

## 2023-02-18 MED ORDER — ROCURONIUM BROMIDE 100 MG/10ML IV SOLN
INTRAVENOUS | Status: DC | PRN
  Administered 2023-02-18: 50 mg via INTRAVENOUS

## 2023-02-18 MED ORDER — ONDANSETRON HCL 4 MG/2ML IJ SOLN
INTRAMUSCULAR | Status: DC | PRN
  Administered 2023-02-18 (×2): 4 mg via INTRAVENOUS

## 2023-02-18 MED ORDER — SUGAMMADEX SODIUM 200 MG/2ML IV SOLN
INTRAVENOUS | Status: DC | PRN
  Administered 2023-02-18: 300 mg via INTRAVENOUS

## 2023-02-18 MED ORDER — DROPERIDOL 2.5 MG/ML IJ SOLN
INTRAMUSCULAR | Status: AC
  Filled 2023-02-18: qty 2

## 2023-02-18 MED ORDER — BUPIVACAINE HCL 0.25 % IJ SOLN
INTRAMUSCULAR | Status: DC | PRN
  Administered 2023-02-18: 30 mL

## 2023-02-18 MED ORDER — DEXAMETHASONE SODIUM PHOSPHATE 10 MG/ML IJ SOLN
INTRAMUSCULAR | Status: DC | PRN
  Administered 2023-02-18: 10 mg via INTRAVENOUS

## 2023-02-18 MED ORDER — ONDANSETRON HCL 4 MG/2ML IJ SOLN
4.0000 mg | Freq: Once | INTRAMUSCULAR | Status: AC | PRN
  Administered 2023-02-18: 4 mg via INTRAVENOUS

## 2023-02-18 MED ORDER — ORAL CARE MOUTH RINSE: 15.0000 mL | Freq: Once | OROMUCOSAL | Status: AC

## 2023-02-18 MED ORDER — BUPIVACAINE HCL (PF) 0.25 % IJ SOLN
INTRAMUSCULAR | Status: AC
  Filled 2023-02-18: qty 30

## 2023-02-18 MED ORDER — CHLORHEXIDINE GLUCONATE 0.12 % MT SOLN
15.0000 mL | Freq: Once | OROMUCOSAL | Status: AC
  Administered 2023-02-18: 15 mL via OROMUCOSAL

## 2023-02-18 MED ORDER — CEFAZOLIN SODIUM-DEXTROSE 2-4 GM/100ML-% IV SOLN
INTRAVENOUS | Status: AC
  Filled 2023-02-18: qty 100

## 2023-02-18 MED ORDER — DROPERIDOL 2.5 MG/ML IJ SOLN
0.6250 mg | Freq: Once | INTRAMUSCULAR | Status: AC
  Administered 2023-02-18: 0.625 mg via INTRAVENOUS

## 2023-02-18 MED ORDER — OXYCODONE HCL 5 MG PO TABS: 5.0000 mg | ORAL_TABLET | Freq: Once | ORAL | Status: DC

## 2023-02-18 MED ORDER — 0.9 % SODIUM CHLORIDE (POUR BTL) OPTIME
TOPICAL | Status: DC | PRN
  Administered 2023-02-18: 500 mL

## 2023-02-18 MED ORDER — DEXMEDETOMIDINE HCL IN NACL 200 MCG/50ML IV SOLN
INTRAVENOUS | Status: DC | PRN
  Administered 2023-02-18: 16 ug via INTRAVENOUS
  Administered 2023-02-18: 4 ug via INTRAVENOUS

## 2023-02-18 MED ORDER — CEFAZOLIN SODIUM-DEXTROSE 2-4 GM/100ML-% IV SOLN
2.0000 g | INTRAVENOUS | Status: AC
  Administered 2023-02-18: 2 g via INTRAVENOUS

## 2023-02-18 MED ORDER — ALBUTEROL SULFATE HFA 108 (90 BASE) MCG/ACT IN AERS
INHALATION_SPRAY | RESPIRATORY_TRACT | Status: DC | PRN
  Administered 2023-02-18 (×2): 2 via RESPIRATORY_TRACT

## 2023-02-18 MED ORDER — LIDOCAINE HCL (CARDIAC) PF 100 MG/5ML IV SOSY
PREFILLED_SYRINGE | INTRAVENOUS | Status: DC | PRN
  Administered 2023-02-18: 100 mg via INTRAVENOUS

## 2023-02-18 MED ORDER — ACETAMINOPHEN 10 MG/ML IV SOLN
INTRAVENOUS | Status: DC | PRN
  Administered 2023-02-18: 1000 mg via INTRAVENOUS

## 2023-02-18 MED ORDER — HYDROCODONE-ACETAMINOPHEN 5-325 MG PO TABS: 1.0000 | ORAL_TABLET | ORAL | 0 refills | Status: AC | PRN

## 2023-02-18 MED ORDER — INDOCYANINE GREEN 25 MG IV SOLR
1.2500 mg | Freq: Once | INTRAVENOUS | Status: AC
  Administered 2023-02-18: 1.25 mg via INTRAVENOUS
  Filled 2023-02-18: qty 0.5

## 2023-02-18 MED ORDER — ACETAMINOPHEN 10 MG/ML IV SOLN
INTRAVENOUS | Status: AC
  Filled 2023-02-18: qty 100

## 2023-02-18 MED ORDER — OXYCODONE HCL 5 MG PO TABS
ORAL_TABLET | ORAL | Status: AC
  Filled 2023-02-18: qty 1

## 2023-02-18 MED ORDER — ONDANSETRON HCL 4 MG/2ML IJ SOLN
INTRAMUSCULAR | Status: AC
  Filled 2023-02-18: qty 2

## 2023-02-18 MED ORDER — FENTANYL CITRATE (PF) 100 MCG/2ML IJ SOLN
INTRAMUSCULAR | Status: DC | PRN
  Administered 2023-02-18 (×2): 50 ug via INTRAVENOUS
  Administered 2023-02-18: 100 ug via INTRAVENOUS

## 2023-02-18 MED ORDER — PROPOFOL 10 MG/ML IV BOLUS
INTRAVENOUS | Status: DC | PRN
  Administered 2023-02-18: 200 mg via INTRAVENOUS

## 2023-02-18 SURGICAL SUPPLY — 52 items
ADH SKN CLS APL DERMABOND .7 (GAUZE/BANDAGES/DRESSINGS) ×2
BAG PRESSURE INF REUSE 1000 (BAG) IMPLANT
BLADE SURG SZ11 CARB STEEL (BLADE) ×2 IMPLANT
CANNULA REDUCER 12-8 DVNC XI (CANNULA) ×2 IMPLANT
CATH REDDICK CHOLANGI 4FR 50CM (CATHETERS) IMPLANT
CAUTERY HOOK MNPLR 1.6 DVNC XI (INSTRUMENTS) ×2 IMPLANT
CLIP LIGATING HEM O LOK PURPLE (MISCELLANEOUS) IMPLANT
CLIP LIGATING HEMO O LOK GREEN (MISCELLANEOUS) ×2 IMPLANT
DERMABOND ADVANCED .7 DNX12 (GAUZE/BANDAGES/DRESSINGS) ×2 IMPLANT
DRAPE ARM DVNC X/XI (DISPOSABLE) ×8 IMPLANT
DRAPE C-ARM XRAY 36X54 (DRAPES) IMPLANT
DRAPE COLUMN DVNC XI (DISPOSABLE) ×2 IMPLANT
ELECT REM PT RETURN 9FT ADLT (ELECTROSURGICAL) ×2
ELECTRODE REM PT RTRN 9FT ADLT (ELECTROSURGICAL) ×2 IMPLANT
FORCEPS BPLR 8 MD DVNC XI (FORCEP) ×2 IMPLANT
FORCEPS BPLR R/ABLATION 8 DVNC (INSTRUMENTS) ×2 IMPLANT
FORCEPS PROGRASP DVNC XI (FORCEP) ×2 IMPLANT
GLOVE BIO SURGEON STRL SZ 6.5 (GLOVE) ×4 IMPLANT
GLOVE BIOGEL PI IND STRL 6.5 (GLOVE) ×4 IMPLANT
GOWN STRL REUS W/ TWL LRG LVL3 (GOWN DISPOSABLE) ×6 IMPLANT
GOWN STRL REUS W/TWL LRG LVL3 (GOWN DISPOSABLE) ×6
GRASPER SUT TROCAR 14GX15 (MISCELLANEOUS) ×2 IMPLANT
IRRIGATOR SUCT 8 DISP DVNC XI (IRRIGATION / IRRIGATOR) IMPLANT
IV CATH ANGIO 12GX3 LT BLUE (NEEDLE) IMPLANT
IV NS 1000ML (IV SOLUTION)
IV NS 1000ML BAXH (IV SOLUTION) IMPLANT
KIT PINK PAD W/HEAD ARE REST (MISCELLANEOUS) ×2
KIT PINK PAD W/HEAD ARM REST (MISCELLANEOUS) ×2 IMPLANT
LABEL OR SOLS (LABEL) ×2 IMPLANT
MANIFOLD NEPTUNE II (INSTRUMENTS) ×2 IMPLANT
NDL HYPO 22X1.5 SAFETY MO (MISCELLANEOUS) ×2 IMPLANT
NDL INSUFFLATION 14GA 120MM (NEEDLE) ×2 IMPLANT
NEEDLE HYPO 22X1.5 SAFETY MO (MISCELLANEOUS) ×2 IMPLANT
NEEDLE INSUFFLATION 14GA 120MM (NEEDLE) ×2 IMPLANT
NS IRRIG 500ML POUR BTL (IV SOLUTION) ×2 IMPLANT
OBTURATOR OPTICAL STND 8 DVNC (TROCAR) ×2
OBTURATOR OPTICALSTD 8 DVNC (TROCAR) ×2 IMPLANT
PACK LAP CHOLECYSTECTOMY (MISCELLANEOUS) ×2 IMPLANT
SEAL UNIV 5-12 XI (MISCELLANEOUS) ×8 IMPLANT
SET TUBE SMOKE EVAC HIGH FLOW (TUBING) ×2 IMPLANT
SOL ELECTROSURG ANTI STICK (MISCELLANEOUS) ×2
SOLUTION ELECTROSURG ANTI STCK (MISCELLANEOUS) ×2 IMPLANT
SPIKE FLUID TRANSFER (MISCELLANEOUS) ×4 IMPLANT
SPONGE T-LAP 4X18 ~~LOC~~+RFID (SPONGE) IMPLANT
SUT MNCRL 4-0 (SUTURE) ×4
SUT MNCRL 4-0 27XMFL (SUTURE) ×4
SUT VICRYL 0 UR6 27IN ABS (SUTURE) ×2 IMPLANT
SUTURE MNCRL 4-0 27XMF (SUTURE) ×2 IMPLANT
SYS BAG RETRIEVAL 10MM (BASKET) ×2
SYSTEM BAG RETRIEVAL 10MM (BASKET) ×2 IMPLANT
TRAP FLUID SMOKE EVACUATOR (MISCELLANEOUS) ×2 IMPLANT
WATER STERILE IRR 500ML POUR (IV SOLUTION) ×2 IMPLANT

## 2023-02-18 NOTE — Op Note (Signed)
Preoperative diagnosis: Chronic cholecystitis  Postoperative diagnosis: Same  Procedure: Robotic Assisted Laparoscopic Cholecystectomy.   Anesthesia: GETA   Surgeon: Dr. Hazle Quant  Wound Classification: Clean Contaminated  Indications: Patient is a 77 y.o. female developed right upper quadrant pain and history of cholecystitis. Robotic Assisted Laparoscopic cholecystectomy was elected.  Findings: Stone in the cystic duct, removed.  Critical view of safety achieved Cystic duct and artery identified, ligated and divided Adequate hemostasis    Description of procedure: The patient was placed on the operating table in the supine position. General anesthesia was induced. A time-out was completed verifying correct patient, procedure, site, positioning, and implant(s) and/or special equipment prior to beginning this procedure. An orogastric tube was placed. The abdomen was prepped and draped in the usual sterile fashion.  An incision was made in a natural skin line below the umbilicus.  The fascia was elevated and the Veress needle inserted. Proper position was confirmed by aspiration and saline meniscus test.  The abdomen was insufflated with carbon dioxide to a pressure of 15 mmHg. The patient tolerated insufflation well. A 8-mm trocar was then inserted in optiview fashion.  The laparoscope was inserted and the abdomen inspected. No injuries from initial trocar placement were noted. Additional trocars were then inserted in the following locations: an 8-mm trocar in the left lateral abdomen, and another two 8-mm trocars to the right side of the abdomen 5 cm appart. The umbilical trocar was changed to a 12 mm trocar all under direct visualization. The abdomen was inspected and no abnormalities were found. The table was placed in the reverse Trendelenburg position with the right side up. The robotic arms were docked and target anatomy identified. Instrument inserted under direct visualization.   Filmy adhesions between the gallbladder and omentum, duodenum and transverse colon were lysed with electrocautery. The dome of the gallbladder was grasped with a prograsp and retracted over the dome of the liver. The infundibulum was also grasped with an atraumatic grasper and retracted toward the right lower quadrant. This maneuver exposed Calot's triangle. The peritoneum overlying the gallbladder infundibulum was then incised and the cystic duct and cystic artery identified and circumferentially dissected. Critical view of safety reviewed before ligating any structure. Firefly images taken to visualize biliary ducts. Cystic duct opening was done and a stone was removed. The cystic duct and cystic artery were then doubly clipped and divided close to the gallbladder.  The gallbladder was then dissected from its peritoneal attachments by electrocautery. Hemostasis was checked and the gallbladder and contained stones were removed using an endoscopic retrieval bag. The gallbladder was passed off the table as a specimen. There was no evidence of bleeding from the gallbladder fossa or cystic artery or leakage of the bile from the cystic duct stump. Secondary trocars were removed under direct vision. No bleeding was noted. The robotic arms were undoked. The scope was withdrawn and the umbilical trocar removed. The abdomen was allowed to collapse. The fascia of the 12mm trocar sites was closed with figure-of-eight 0 vicryl sutures. The skin was closed with subcuticular sutures of 4-0 monocryl and topical skin adhesive. The orogastric tube was removed.  The patient tolerated the procedure well and was taken to the postanesthesia care unit in stable condition.   Specimen: Gallbladder  Complications: None  EBL: 5 mL

## 2023-02-18 NOTE — Anesthesia Procedure Notes (Signed)
Procedure Name: Intubation Date/Time: 02/18/2023 7:35 AM  Performed by: Mohammed Kindle, CRNAPre-anesthesia Checklist: Patient identified, Emergency Drugs available, Suction available and Patient being monitored Patient Re-evaluated:Patient Re-evaluated prior to induction Oxygen Delivery Method: Circle system utilized Preoxygenation: Pre-oxygenation with 100% oxygen Induction Type: IV induction Ventilation: Mask ventilation without difficulty Laryngoscope Size: McGraph and 3 Grade View: Grade I Tube type: Oral Tube size: 6.5 mm Number of attempts: 1 Airway Equipment and Method: Stylet and Oral airway Placement Confirmation: ETT inserted through vocal cords under direct vision, positive ETCO2, breath sounds checked- equal and bilateral and CO2 detector Secured at: 21 cm Tube secured with: Tape Dental Injury: Teeth and Oropharynx as per pre-operative assessment

## 2023-02-18 NOTE — H&P (Signed)
PATIENT PROFILE: Sharon [name] is a 77 y.o. female who presents to the OR today for treatment of cholelithiasis with previous episode of cholecystitis.  PCP: Ouida Sills, NP  HISTORY OF PRESENT ILLNESS: Sharon [name] had an episode of cholecystitis earlier this year in March.  She was treated with antibiotic therapy and was able to resolve.  She then elected to proceed with cholecystectomy but due to COVID infection she needed to reschedule the surgery.  Patient denies any new episode of right upper quadrant pain.  She has been tolerating diet.  Denies chest pain or shortness of breath.  PROBLEM LIST: Problem List Date Reviewed: 09/23/2022   Noted  Cerebrovascular accident (CVA) (CMS-HCC) 09/11/2022  Dysphagia 09/11/2022   GENERAL REVIEW OF SYSTEMS:   General ROS: negative for - chills, fatigue, fever, weight gain or weight loss Allergy and Immunology ROS: negative for - hives  Hematological and Lymphatic ROS: negative for - bleeding problems or bruising, negative for palpable nodes Endocrine ROS: negative for - heat or cold intolerance, hair changes Respiratory ROS: negative for - cough, shortness of breath or wheezing Cardiovascular ROS: no chest pain or palpitations GI ROS: negative for nausea, vomiting, diarrhea, constipation. Positive abdominal pain Musculoskeletal ROS: negative for - joint swelling or muscle pain Neurological ROS: negative for - confusion, syncope Dermatological ROS: negative for pruritus and rash Psychiatric: negative for anxiety, depression, difficulty sleeping and memory loss  MEDICATIONS: Current Outpatient Medications  Medication Sig Dispense Refill  ACCU-CHEK GUIDE GLUCOSE METER Misc once daily  ACCU-CHEK GUIDE TEST STRIPS test strip once daily  acetaminophen (TYLENOL) 500 MG tablet Take 500 mg by mouth every 6 (six) hours as needed  albuterol 90 mcg/actuation inhaler Inhale 2 inhalations into the lungs  amoxicillin-clavulanate (AUGMENTIN)  875-125 mg tablet Take 1 tablet (875 mg total) by mouth every 12 (twelve) hours for 10 days 20 tablet 0  aspirin 81 MG EC tablet Take 81 mg by mouth once daily  budesonide-glycopyrrolate-formoterol (BREZTRI AEROSPHERE) 160-9-4.8 mcg/actuation inhaler Inhale 2 inhalations into the lungs 2 (two) times daily  citalopram (CELEXA) 20 MG tablet Take 20 mg by mouth once daily  clopidogreL (PLAVIX) 75 mg tablet Take 75 mg by mouth once daily  ergocalciferol, vitamin D2, 1,250 mcg (50,000 unit) capsule Take 50,000 Units by mouth once a week  ezetimibe (ZETIA) 10 mg tablet Take 10 mg by mouth once daily  loratadine (CLARITIN) 10 mg tablet Take 10 mg by mouth once daily  pantoprazole (PROTONIX) 40 MG DR tablet Take 40 mg by mouth once daily  penicillin v potassium 250 MG tablet  crisaborole (EUCRISA) 2 % Oint Apply topically (Patient not taking: Reported on 12/18/2022)   No current facility-administered medications for this visit.   ALLERGIES: Alirocumab, Baclofen, Bactrim [sulfamethoxazole-trimethoprim], Ibuprofen, Lanolin, Latex, Librium [chlordiazepoxide hcl], Quinolones, Statins-hmg-coa reductase inhibitors, and Triamcinolone (bulk)  PAST MEDICAL HISTORY: History reviewed. No pertinent past medical history.  PAST SURGICAL HISTORY: History reviewed. No pertinent surgical history.   FAMILY HISTORY: History reviewed. No pertinent family history.   SOCIAL HISTORY: Social History   Socioeconomic History  Marital status: Married  Tobacco Use  Smoking status: Former  Types: Cigarettes  Quit date: 08/13/2014  Years since quitting: 8.3  Substance and Sexual Activity  Alcohol use: Defer  Drug use: Defer   PHYSICAL EXAM: Vitals:  12/25/22 1544  BP: 133/78  Pulse: 84   Body mass index is 31.77 kg/m. Weight: 96.2 kg (212 lb)   GENERAL: Alert, active, oriented x3  HEENT: Pupils equal  reactive to light. Extraocular movements are intact. Sclera clear. Palpebral conjunctiva normal red  color.Pharynx clear.  NECK: Supple with no palpable mass and no adenopathy.  LUNGS: Sound clear with no rales rhonchi or wheezes.  HEART: Regular rhythm S1 and S2 without murmur.  ABDOMEN: Soft and depressible, nontender with no palpable mass, no hepatomegaly.   EXTREMITIES: Well-developed well-nourished symmetrical with no dependent edema.  NEUROLOGICAL: Awake alert oriented, facial expression symmetrical, moving all extremities.   ASSESSMENT: Sharon [name] is a 77 y.o. female presenting for cholecystectomy.  Today without any signs of COVID infection.  She was again oriented about surgical intervention of cholecystectomy.  Risks were reviewed again.  She understood she understood and agreed to proceed with planned robotic assisted laparoscopic cholecystectomy.  CCC (chronic calculous cholecystitis) [K80.10]  PLAN: Robotic assisted laparoscopic cholecystectomy today  Patient and and her husband verbalized understanding, all questions were answered, and were agreeable with the plan outlined above.   Carolan Shiver, MD

## 2023-02-18 NOTE — Transfer of Care (Signed)
Immediate Anesthesia Transfer of Care Note  Patient: Sharon [name]  Procedure(s) Performed: XI ROBOTIC ASSISTED LAPAROSCOPIC CHOLECYSTECTOMY (Abdomen) INDOCYANINE GREEN FLUORESCENCE IMAGING (ICG)  Patient Location: PACU  Anesthesia Type:General  Level of Consciousness: awake, drowsy, and patient cooperative  Airway & Oxygen Therapy: Patient Spontanous Breathing and Patient connected to face mask oxygen  Post-op Assessment: Report given to RN and Post -op Vital signs reviewed and stable  Post vital signs: Reviewed and stable  Last Vitals:  Vitals Value Taken Time  BP 156/80 02/18/23 0843  Temp    Pulse 77 02/18/23 0848  Resp 19 02/18/23 0848  SpO2 94 % 02/18/23 0848  Vitals shown include unvalidated device data.  Last Pain:  Vitals:   02/18/23 1610  TempSrc: Temporal  PainSc: 0-No pain         Complications: No notable events documented.

## 2023-02-18 NOTE — Discharge Instructions (Addendum)
  Diet: Resume home heart healthy regular diet.   Activity: No heavy lifting >20 pounds (children, pets, laundry, garbage) or strenuous activity until follow-up, but light activity and walking are encouraged. Do not drive or drink alcohol if taking narcotic pain medications.  Wound care: May shower with soapy water and pat dry (do not rub incisions), but no baths or submerging incision underwater until follow-up. (no swimming)   Medications: Resume all home medications. For mild to moderate pain: acetaminophen (Tylenol) or ibuprofen (if no kidney disease). Combining Tylenol with alcohol can substantially increase your risk of causing liver disease. Narcotic pain medications, if prescribed, can be used for severe pain, though may cause nausea, constipation, and drowsiness. Do not combine Tylenol and Norco within a 6 hour period as Norco contains Tylenol. If you do not need the narcotic pain medication, you do not need to fill the prescription.  Call office ([phone]) at any time if any questions, worsening pain, fevers/chills, bleeding, drainage from incision site, or other concerns.  

## 2023-02-18 NOTE — Anesthesia Preprocedure Evaluation (Signed)
Anesthesia Evaluation  Patient identified by MRN, date of birth, ID band Patient awake    Reviewed: Allergy & Precautions, H&P , NPO status , Patient's Chart, lab work & pertinent test results, reviewed documented beta blocker date and time   History of Anesthesia Complications Negative for: history of anesthetic complications  Airway Mallampati: III  TM Distance: >3 FB Neck ROM: full    Dental  (+) Dental Advidsory Given, Caps, Partial Lower, Teeth Intact   Pulmonary neg shortness of breath, asthma , neg sleep apnea, COPD,  COPD inhaler, neg recent URI, former smoker   Pulmonary exam normal breath sounds clear to auscultation       Cardiovascular Exercise Tolerance: Good (-) hypertension(-) angina + CAD and + Peripheral Vascular Disease  (-) Past MI, (-) Cardiac Stents and (-) CHF Normal cardiovascular exam(-) dysrhythmias (-) Valvular Problems/Murmurs Rhythm:regular Rate:Normal     Neuro/Psych neg Seizures PSYCHIATRIC DISORDERS Anxiety      Neuromuscular disease CVA, Residual Symptoms    GI/Hepatic Neg liver ROS,GERD  ,,  Endo/Other  diabetes (borderline), Well Controlled    Renal/GU negative Renal ROS  negative genitourinary   Musculoskeletal   Abdominal   Peds  Hematology negative hematology ROS (+)   Anesthesia Other Findings Past Medical History: No date: Aneurysm of descending thoracic aorta (HCC) No date: Anxiety No date: Arthritis     Comment:  hands, upper back No date: Asthma No date: Cervical cancer (HCC)     Comment:  in situ 1970's No date: CHF (congestive heart failure) (HCC) No date: COPD (chronic obstructive pulmonary disease) (HCC) No date: Coronary artery disease No date: COVID No date: GERD (gastroesophageal reflux disease) No date: History of cervical cancer No date: HLD (hyperlipidemia) No date: Lymphedema No date: Menopausal disorder No date: Neuropathy No date: Osteoporosis 1960:  Pneumonia No date: Pre-diabetes No date: PVD (peripheral vascular disease) (HCC) No date: Spasm of abdominal muscles of right side     Comment:  intermittent 2019: Stroke (HCC) No date: TMJ (dislocation of temporomandibular joint) No date: Wears dentures     Comment:  partial lower   Reproductive/Obstetrics negative OB ROS                             Anesthesia Physical Anesthesia Plan  ASA: 3  Anesthesia Plan: General   Post-op Pain Management:    Induction: Intravenous  PONV Risk Score and Plan: 3 and Ondansetron, Dexamethasone and Treatment may vary due to age or medical condition  Airway Management Planned: Oral ETT  Additional Equipment:   Intra-op Plan:   Post-operative Plan: Extubation in OR  Informed Consent: I have reviewed the patients History and Physical, chart, labs and discussed the procedure including the risks, benefits and alternatives for the proposed anesthesia with the patient or authorized representative who has indicated his/her understanding and acceptance.     Dental Advisory Given  Plan Discussed with: Anesthesiologist, CRNA and Surgeon  Anesthesia Plan Comments:         Anesthesia Quick Evaluation

## 2023-02-19 LAB — SURGICAL PATHOLOGY

## 2023-02-21 NOTE — Anesthesia Postprocedure Evaluation (Signed)
Anesthesia Post Note  Patient: Sharon [name]  Procedure(s) Performed: XI ROBOTIC ASSISTED LAPAROSCOPIC CHOLECYSTECTOMY (Abdomen) INDOCYANINE GREEN FLUORESCENCE IMAGING (ICG)  Patient location during evaluation: PACU Anesthesia Type: General Level of consciousness: awake and alert Pain management: pain level controlled Vital Signs Assessment: post-procedure vital signs reviewed and stable Respiratory status: spontaneous breathing, nonlabored ventilation, respiratory function stable and patient connected to nasal cannula oxygen Cardiovascular status: blood pressure returned to baseline and stable Postop Assessment: no apparent nausea or vomiting Anesthetic complications: no   No notable events documented.   Last Vitals:  Vitals:   02/18/23 1030 02/18/23 1039  BP: (!) 112/59   Pulse: 83 78  Resp: (!) 25 16  Temp:    SpO2: 94% 96%    Last Pain:  Vitals:   02/19/23 1004  TempSrc:   PainSc: 3                  Lenard Simmer

## 2023-02-27 ENCOUNTER — Ambulatory Visit (INDEPENDENT_AMBULATORY_CARE_PROVIDER_SITE_OTHER): Payer: Medicare Other

## 2023-02-27 DIAGNOSIS — E538 Deficiency of other specified B group vitamins: Secondary | ICD-10-CM

## 2023-02-27 MED ORDER — CYANOCOBALAMIN 1000 MCG/ML IJ SOLN
1000.0000 ug | Freq: Once | INTRAMUSCULAR | Status: AC
Start: 2023-02-27 — End: 2023-02-27
  Administered 2023-02-27: 1000 ug via INTRAMUSCULAR

## 2023-02-27 NOTE — Progress Notes (Signed)
Patient presents today for B-12 Injection, patient received in Left  deltoid, patient tolerated well.

## 2023-03-03 ENCOUNTER — Ambulatory Visit: Payer: Medicare Other | Admitting: Dermatology

## 2023-03-03 ENCOUNTER — Ambulatory Visit (INDEPENDENT_AMBULATORY_CARE_PROVIDER_SITE_OTHER): Payer: Medicare Other | Admitting: Nurse Practitioner

## 2023-03-03 ENCOUNTER — Other Ambulatory Visit (INDEPENDENT_AMBULATORY_CARE_PROVIDER_SITE_OTHER): Payer: Medicare Other

## 2023-03-12 DIAGNOSIS — I639 Cerebral infarction, unspecified: Secondary | ICD-10-CM | POA: Diagnosis not present

## 2023-03-12 DIAGNOSIS — E1169 Type 2 diabetes mellitus with other specified complication: Secondary | ICD-10-CM | POA: Diagnosis not present

## 2023-03-12 DIAGNOSIS — R1310 Dysphagia, unspecified: Secondary | ICD-10-CM | POA: Diagnosis not present

## 2023-03-12 DIAGNOSIS — E785 Hyperlipidemia, unspecified: Secondary | ICD-10-CM | POA: Diagnosis not present

## 2023-03-12 DIAGNOSIS — R531 Weakness: Secondary | ICD-10-CM | POA: Diagnosis not present

## 2023-03-12 DIAGNOSIS — R4781 Slurred speech: Secondary | ICD-10-CM | POA: Diagnosis not present

## 2023-03-18 ENCOUNTER — Other Ambulatory Visit (INDEPENDENT_AMBULATORY_CARE_PROVIDER_SITE_OTHER): Payer: Self-pay | Admitting: Nurse Practitioner

## 2023-03-18 DIAGNOSIS — I714 Abdominal aortic aneurysm, without rupture, unspecified: Secondary | ICD-10-CM

## 2023-03-20 ENCOUNTER — Other Ambulatory Visit: Payer: Self-pay | Admitting: Internal Medicine

## 2023-03-20 ENCOUNTER — Telehealth: Payer: Self-pay | Admitting: Internal Medicine

## 2023-03-20 NOTE — Telephone Encounter (Signed)
Spoke to patient and informed her that the clopidogrel (PLAVIX) 75 MG tablet needs to be prescribed by her PCP Aura Dials, NP or recommend reaching out to Dr. Malvin Johns as well with neurology since it is prescribed for post stroke. Patient stated she always got it from her cardiologist but she will reach out to those other for refills of it.

## 2023-03-20 NOTE — Telephone Encounter (Signed)
Pt c/o medication issue:  1. Name of Medication: clopidogrel (PLAVIX) 75 MG tablet   2. How are you currently taking this medication (dosage and times per day)?  Take 1 tablet (75 mg total) by mouth daily  3. Are you having a reaction (difficulty breathing--STAT)? No   4. What is your medication issue? Patient said that she was going to pick up medication and it was denied. Please call back to discuss

## 2023-03-25 NOTE — Progress Notes (Signed)
Cardiology Office Note    Date:  03/26/2023   ID:  Sharon [name], Sharon [name] [birth year], MRN [id number]  PCP:  Marjie Skiff, NP  Cardiologist:  Julien Nordmann, MD  Electrophysiologist:  Sherryl Manges, MD   Chief Complaint: Follow up  History of Present Illness:   Sharon [name] is a 77 y.o. 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   She was admitted to the hospital in 12/2017 with slurred speech and weakness involving the left hand with associated facial droop.  She was found to have multiple right cerebral infarcts concerning for embolic phenomena.  Carotid artery ultrasound showed minor carotid artery atherosclerosis with no hemodynamically significant disease.  Surface echo showed an EF of 70% with no regional wall motion abnormalities.  TEE showed an EF of 55 to 65%, no regional wall motion abnormalities, no left atrial appendage thrombus, and no cardiac source of emboli identified with a normal sized aortic root, ascending thoracic aorta, aortic arch, and descending thoracic aorta.  She subsequently underwent ILR implantation with no evidence of A. fib identified.  Echo from 09/2018 showed an EF of 55 to 60%, no regional wall motion abnormalities, grade 1 diastolic dysfunction, upper limit of normal PASP, no significant valvular abnormalities, and a small pericardial effusion identified posterior to the heart.  She was seen in the office in 02/2021 for pre-procedure cardiac risk stratification and was doing well from a cardiac perspective.  She was referred to the lipid clinic for  further management of her hyperlipidemia, and was started on Praluent.  However, this has been discontinued secondary to injection site reaction and influenza-like symptoms.   She was admitted to the hospital in 09/2021 with contact/stasis dermatitis of the left lower extremity.  Venous Doppler was negative for DVT.  Initially, she was treated with antibiotics, though these were discontinued per ID recommendation.  She had notable improvement following initiation of steroid for contact dermatitis.     She was seen in 10/2021 noting improvement in her stasis dermatitis.  With regards to her history of CVA, ILR showed no evidence of A-fib with loop recorder being explanted in 12/2021.   Echo in 12/2021 demonstrated an EF of 55 to 60%, no regional wall motion abnormalities, grade 2 diastolic dysfunction, normal RV systolic function and ventricular cavity size, trivial mitral regurgitation, and an estimated right atrial pressure of 3 mmHg.    Chest CT 07/2022 showed stable aneurysmal dilatation of the descending thoracic aorta measuring 4 cm.   She was seen in the ED in 06/2022 left left arm tingling and numbness with radiation to her face.  CT head showed no acute intracranial hemorrhage or evidence of infarct.  MRI of the brain showed no acute intracranial abnormality.  MRA of the head/neck showed no major intracranial arterial occlusion or significant proximal stenosis with severe left vertebral artery origin stenosis and widely patent cervical carotid arteries.  She was admitted later in 06/2022 with slurred speech with CT of the head showing focal hypodensity involving the right insular cortex suspicious for an evolving acute right MCA distribution infarct.  MRI of the brain showed an  acute ischemic right MCA territory infarct without associated hemorrhage or mass effect.  MRA of the head/neck showed interval right M2 branch occlusion consistent with right MCA territory infarct with wide patency of the carotid  artery systems.  The previously noted stenosis at the origin of the left vertebral artery was more mild to moderate in appearance.  Echo during that admission showed an EF of 55 to 60%, no regional wall motion abnormalities, grade 1 diastolic dysfunction, normal RV systolic function and ventricular cavity size, aortic valve sclerosis without evidence of stenosis, and an estimated right atrial pressure of 3 mmHg.  She was seen in the ED in 10/2022 with dizziness and weakness.  CT of the head showed no evidence of hemorrhage or acute infarct.  MRI of the brain showed no acute intracranial process.  I saw her in 11/2022, at which time she was without symptoms of angina or cardiac decompensation.  She was referred to EP for consideration of reimplantation of loop recorder given recurrent cryptogenic stroke with no further plans for EP evaluation.  She was referred to the lipid clinic given documented intolerance to atorvastatin, rosuvastatin, and Praluent.  Insurance would not cover Leqvio.  She was rechallenged on low-dose rosuvastatin, though was concerned this may have been contributing to headache.  AAA ultrasound in 03/2023 demonstrated the largest aortic measurement of 3.3 cm which was unchanged when compared to study from 02/2022.  She underwent robotic assisted cholecystectomy in 02/2023 without adverse cardiac event.  She comes in to go from a cardiac perspective and is without symptoms of angina or cardiac decompensation.  She continues to note a change in her speech following her CVA in 06/2022 and does feel like this is a little worse following intubation with her cholecystectomy.  Wearing compression hose.  No progressive orthopnea.  She has cut out some carbs, and in this setting, her weight is down 14 pounds when compared to her last visit in 11/2022.  No dizziness, presyncope, or syncope.   Labs independently reviewed: 01/2023 - TSH normal, TC 111, TG 82, HDL 44, LDL 51 BUN 14, serum creatinine 1.00,  potassium 4.2, albumin 4.1, AST/ALT normal, Hgb 13.9, PLT 180, A1c 6.5  Past Medical History:  Diagnosis Date   Aneurysm of descending thoracic aorta (HCC)    Anxiety    Arthritis    hands, upper back   Asthma    Cervical cancer (HCC)    in situ 1970's   CHF (congestive heart failure) (HCC)    COPD (chronic obstructive pulmonary disease) (HCC)    Coronary artery disease    COVID    GERD (gastroesophageal reflux disease)    History of cervical cancer    HLD (hyperlipidemia)    Lymphedema    Menopausal disorder    Neuropathy    Osteoporosis    Pneumonia 1960   Pre-diabetes    PVD (peripheral vascular disease) (HCC)    Spasm of abdominal muscles of right side    intermittent   Stroke (HCC) 2019   TMJ (dislocation of temporomandibular joint)    Wears dentures    partial lower    Past Surgical History:  Procedure Laterality Date   ABDOMINAL HYSTERECTOMY  1970's   bladder botox  2005   BLADDER SUSPENSION  2004   CATARACT EXTRACTION W/PHACO Right 03/09/2018   Procedure: CATARACT EXTRACTION PHACO AND INTRAOCULAR LENS PLACEMENT (IOC) right;  Surgeon: Nevada Crane, MD;  Location: [facility];  Service: Ophthalmology;  Laterality: Right;  CALL  CELL 1ST   CATARACT EXTRACTION W/PHACO Left 04/19/2018   Procedure: CATARACT EXTRACTION PHACO AND INTRAOCULAR LENS PLACEMENT (IOC)  LEFT;  Surgeon: Nevada Crane, MD;  Location: [facility];  Service: Ophthalmology;  Laterality: Left;   CHOLECYSTECTOMY, LAPAROSCOPIC  02/18/2023   COLONOSCOPY WITH PROPOFOL N/A 12/13/2015   Procedure: COLONOSCOPY WITH PROPOFOL;  Surgeon: Midge Minium, MD;  Location: [facility];  Service: Endoscopy;  Laterality: N/A;   COLONOSCOPY WITH PROPOFOL N/A 03/14/2021   Procedure: COLONOSCOPY WITH PROPOFOL;  Surgeon: Midge Minium, MD;  Location: [facility];  Service: Endoscopy;  Laterality: N/A;  diabetic   LOOP RECORDER INSERTION N/A 01/07/2018   Procedure: LOOP RECORDER  INSERTION;  Surgeon: Duke Salvia, MD;  Location: [facility] INVASIVE CV LAB;  Service: Cardiovascular;  Laterality: N/A;   LOOP RECORDER REMOVAL     POLYPECTOMY N/A 12/13/2015   Procedure: POLYPECTOMY;  Surgeon: Midge Minium, MD;  Location: [facility];  Service: Endoscopy;  Laterality: N/A;  SIGMOID COLON POLYPS X  5   POLYPECTOMY N/A 03/14/2021   Procedure: POLYPECTOMY;  Surgeon: Midge Minium, MD;  Location: [facility];  Service: Endoscopy;  Laterality: N/A;   SHOULDER ARTHROSCOPY W/ ROTATOR CUFF REPAIR Right 1998   TEE WITHOUT CARDIOVERSION N/A 01/06/2018   Procedure: TRANSESOPHAGEAL ECHOCARDIOGRAM (TEE);  Surgeon: Antonieta Iba, MD;  Location: [facility] ORS;  Service: Cardiovascular;  Laterality: N/A;   TONSILLECTOMY AND ADENOIDECTOMY     TOOTH EXTRACTION     July 2023    Current Medications: Current Meds  Medication Sig   acetaminophen (TYLENOL) 500 MG tablet Take 500 mg by mouth every 6 (six) hours as needed.   albuterol (PROAIR HFA) 108 (90 Base) MCG/ACT inhaler Inhale 2 puffs into the lungs every 6 (six) hours as needed for wheezing or shortness of breath.   aspirin EC 81 MG tablet Take 81 mg by mouth 3 (three) times a week. Swallow whole. M-W-Fr   Blood Glucose Monitoring Suppl (ONETOUCH VERIO) w/Device KIT Utilize to check blood sugar twice a day, fasting in morning with goal < 130 and then 2 hours after a meal with goal <180.  Document and bring to visits.   BREZTRI AEROSPHERE 160-9-4.8 MCG/ACT AERO INHALE 2 PUFFS BY MOUTH TWICE DAILY AS DIRECTED   Cholecalciferol (VITAMIN D3) 50 MCG (2000 UT) CAPS Take 1 capsule by mouth daily.   citalopram (CELEXA) 20 MG tablet Take 1 tablet (20 mg total) by mouth as needed. (Patient taking differently: Take 20 mg by mouth as needed.)   clopidogrel (PLAVIX) 75 MG tablet Take 1 tablet (75 mg total) by mouth daily.   Crisaborole (EUCRISA) 2 % OINT Apply 1 application  topically daily. Use for itching and inflammation at left leg    Cyanocobalamin 1000 MCG/ML KIT Inject 1 vial as directed every 30 (thirty) days.   ezetimibe (ZETIA) 10 MG tablet Take 1 tablet (10 mg total) by mouth daily. (Patient taking differently: Take 10 mg by mouth at bedtime.)   glucose blood (ONETOUCH VERIO) test strip UTILIIZE TO CHECK BLOOD SUGAR TWICE DAILY FASTING IN MORNING WITH GOAL<130 AND THAN 2 HOURS AFTER A MEAL WITH GGOAL<180   Lancets (ONETOUCH ULTRASOFT) lancets Utilize to check blood sugar twice a day, fasting in morning with goal < 130 and then 2 hours after a meal with goal <180.  Document and bring to visits.   loratadine (CLARITIN) 10 MG tablet TAKE 1 TABLET BY MOUTH EVERY DAY (Patient taking differently: Take 10 mg by mouth  at bedtime.)   pantoprazole (PROTONIX) 40 MG tablet TAKE 1 TABLET(40 MG) BY MOUTH DAILY   rosuvastatin (CRESTOR) 5 MG tablet Take 1 tablet (5 mg total) by mouth daily.    Allergies:   Statins, Dermacerin [aquaphilic], Nsaids, Pineapple, Praluent [alirocumab], Quinolones, Baclofen, Bactrim [sulfamethoxazole-trimethoprim], Ibuprofen, Lanolin, Latex, Librium [chlordiazepoxide], Naprosyn [naproxen], and Other   Social History   Socioeconomic History   Marital status: Married    Spouse name: Cyani Kallstrom   Number of children: 1   Years of education: Not on file   Highest education level: Some college, no degree  Occupational History   Occupation: retired  Tobacco Use   Smoking status: Former    Years: 56    Types: Cigarettes    Quit date: 07/14/2016    Years since quitting: 6.7   Smokeless tobacco: Never  Vaping Use   Vaping Use: Former  Substance and Sexual Activity   Alcohol use: Yes    Comment: Ambulance person   Drug use: No   Sexual activity: Not Currently    Birth control/protection: Post-menopausal  Other Topics Concern   Not on file  Social History Narrative   Lives with husband, manages farm   Social Determinants of Health   Financial Resource Strain: Low Risk  (06/03/2022)   Overall  Financial Resource Strain (CARDIA)    Difficulty of Paying Living Expenses: Not hard at all  Food Insecurity: No Food Insecurity (07/03/2022)   Hunger Vital Sign    Worried About Running Out of Food in the Last Year: Never true    Ran Out of Food in the Last Year: Never true  Transportation Needs: No Transportation Needs (07/03/2022)   PRAPARE - Administrator, Civil Service (Medical): No    Lack of Transportation (Non-Medical): No  Physical Activity: Inactive (06/03/2022)   Exercise Vital Sign    Days of Exercise per Week: 0 days    Minutes of Exercise per Session: 0 min  Stress: No Stress Concern Present (06/03/2022)   [employer] of Occupational Health - Occupational Stress Questionnaire    Feeling of Stress : Only a little  Social Connections: Moderately Isolated (06/03/2022)   Social Connection and Isolation Panel [NHANES]    Frequency of Communication with Friends and Family: More than three times a week    Frequency of Social Gatherings with Friends and Family: Never    Attends Religious Services: Never    Database administrator or Organizations: No    Attends Engineer, structural: Never    Marital Status: Married     Family History:  The patient's family history includes Diabetes in her mother; Heart disease in her mother; Stroke in her maternal grandmother and mother. There is no history of Hyperlipidemia.  ROS:   12-point review of systems is negative unless otherwise noted in the HPI.   EKGs/Labs/Other Studies Reviewed:    Studies reviewed were summarized above. The additional studies were reviewed today:  2D echo 07/03/2022: 1. Left ventricular ejection fraction, by estimation, is 55 to 60%. The  left ventricle has normal function. The left ventricle has no regional  wall motion abnormalities. Left ventricular diastolic parameters are  consistent with Grade I diastolic  dysfunction (impaired relaxation).   2. Right ventricular systolic  function is normal. The right ventricular  size is normal.   3. The mitral valve is normal in structure. No evidence of mitral valve  regurgitation. No evidence of mitral stenosis.   4. The aortic  valve is normal in structure. There is mild calcification  of the aortic valve. Aortic valve regurgitation is not visualized. Aortic  valve sclerosis/calcification is present, without any evidence of aortic  stenosis.   5. The inferior vena cava is normal in size with greater than 50%  respiratory variability, suggesting right atrial pressure of 3 mmHg.  __________   AAA ultrasound 02/27/2022 (vascular surgery): Summary:  Abdominal Aorta: The largest aortic measurement is 3.3 cm. Mild dilitation  again seen to the proximal abdominal aorta with normal tapering distally  to the distal aorta. Normal caliber CIA's bilaterally. Normal flow  throughout. The largest aortic diameter   remains essentially unchanged compared to prior exam. Previous diameter  measurement was 3.2 cm obtained on 08/2021.  __________   2D echo 12/25/2021: 1. Left ventricular ejection fraction, by estimation, is 55 to 60%. The  left ventricle has normal function. The left ventricle has no regional  wall motion abnormalities. Left ventricular diastolic parameters are  consistent with Grade II diastolic  dysfunction (pseudonormalization).   2. Right ventricular systolic function is normal. The right ventricular  size is normal.   3. The mitral valve is normal in structure. Trivial mitral valve  regurgitation.   4. The aortic valve was not well visualized. Aortic valve regurgitation  is not visualized.   5. The inferior vena cava is normal in size with greater than 50%  respiratory variability, suggesting right atrial pressure of 3 mmHg. __________   2D echo 09/2018: - Left ventricle: The cavity size was normal. There was mild    concentric hypertrophy. Systolic function was normal. The    estimated ejection fraction  was in the range of 55% to 60%. Wall    motion was normal; there were no regional wall motion    abnormalities. Doppler parameters are consistent with abnormal    left ventricular relaxation (grade 1 diastolic dysfunction).  - Pulmonary arteries: Systolic pressure was at the upper limits of    normal.  - Pericardium, extracardiac: A small pericardial effusion was    identified posterior to the heart. __________   TEE 12/2017: - Left ventricle: Systolic function was normal. The estimated    ejection fraction was in the range of 55% to 65%. Wall motion was    normal; there were no regional wall motion abnormalities.  - Left atrium: No evidence of thrombus in the atrial cavity or    appendage. No evidence of thrombus in the atrial cavity or    appendage.  - Right ventricle: Systolic function was normal.  - Right atrium: No evidence of thrombus in the atrial cavity or    appendage.  - Atrial septum: No defect or patent foramen ovale was identified.    Echo contrast study showed no right-to-left atrial level shunt,    at baseline or with provocation.   Impressions:   - No cardiac source of emboli was indentified.  __________   2D echo 12/2017: - Procedure narrative: Transthoracic echocardiography. Image    quality was suboptimal. The study was technically difficult.  - Left ventricle: The cavity size was normal. Systolic function was    vigorous. The estimated ejection fraction was 70%. Wall motion    was normal; there were no regional wall motion abnormalities.  - Aortic valve: Valve area (Vmax): 2.92 cm^2.   Impressions:   - No cardiac source of emboli was indentified.   EKG:  EKG is not ordered today.    Recent Labs: 01/13/2023: ALT 16; BUN 14;  Creatinine, Ser 1.00; Hemoglobin 13.9; Platelets 180; Potassium 4.2; Sodium 139; TSH 0.658  Recent Lipid Panel    Component Value Date/Time   CHOL 111 01/13/2023 1441   TRIG 82 01/13/2023 1441   HDL 44 01/13/2023 1441   CHOLHDL  3.6 07/03/2022 0609   VLDL 14 07/03/2022 0609   LDLCALC 51 01/13/2023 1441   LDLDIRECT 142.3 (H) 03/21/2021 1024    PHYSICAL EXAM:    VS:  BP (!) 110/54 (BP Location: Left Arm, Patient Position: Sitting, Cuff Size: Normal)   Pulse 92   Ht 5\' 8"  (1.727 m)   Wt 201 lb (91.2 kg)   LMP  (LMP Unknown)   SpO2 98%   BMI 30.56 kg/m   BMI: Body mass index is 30.56 kg/m.  Physical Exam Vitals reviewed.  Constitutional:      Appearance: She is well-developed.  HENT:     Head: Normocephalic and atraumatic.  Eyes:     General:        Right eye: No discharge.        Left eye: No discharge.  Neck:     Vascular: No JVD.  Cardiovascular:     Rate and Rhythm: Normal rate and regular rhythm.     Heart sounds: Normal heart sounds, S1 normal and S2 normal. Heart sounds not distant. No midsystolic click and no opening snap. No murmur heard.    No friction rub.  Pulmonary:     Effort: Pulmonary effort is normal. No respiratory distress.     Breath sounds: Normal breath sounds. No decreased breath sounds, wheezing or rales.  Chest:     Chest wall: No tenderness.  Abdominal:     General: There is no distension.  Musculoskeletal:     Cervical back: Normal range of motion.     Right lower leg: Edema present.     Left lower leg: Edema present.     Comments: Bilateral compression socks noted.  Skin:    General: Skin is warm and dry.     Nails: There is no clubbing.  Neurological:     Mental Status: She is alert and oriented to person, place, and time.  Psychiatric:        Speech: Speech normal.        Behavior: Behavior normal.        Thought Content: Thought content normal.        Judgment: Judgment normal.     Wt Readings from Last 3 Encounters:  03/26/23 201 lb (91.2 kg)  03/26/23 200 lb (90.7 kg)  02/18/23 203 lb (92.1 kg)     ASSESSMENT & PLAN:   Coronary artery calcification/aortic atherosclerosis/HLD: No symptoms concerning for angina or cardiac decompensation.  LDL 51 in  01/2023 on ezetimibe and rosuvastatin 5 mg daily.  She does not tolerate higher doses of rosuvastatin.  Otherwise, she remains on aspirin for primary prevention.  No indication for ischemic testing at this time.  Dilated descending thoracic aorta: Stable by CT in 07/2022 with recommendation to be 02/2023 (lung cancer screening CT at time, as ordered by pulmonology).  AAA: Stable, measuring 3.3 cm on ultrasound in 03/2023. Followed by vascular surgery.   History of recurrent cryptogenic CVA: Prior loop recorder implanted in 2019 through 10/2021 showed no evidence of A-fib. Recurrent CVA involving the right MCA territory in 06/2022.  She was reevaluated by EP without recommendation for further monitoring.  Followed by neurology with recommendation to take clopidogrel 75 mg daily and aspirin 81 mg  Monday, Wednesday, and Friday.  No new deficits.  Diastolic dysfunction: Euvolemic and well compensated.  No longer requiring standing loop diuretic.  Continue optimal blood pressure control.  Lymphedema: Stable.  Continue with compression stockings and lymphedema pumps under the direction of vascular surgery.    Disposition: F/u with Dr. Mariah Milling or an APP in 6 months, and EP as directed.   Medication Adjustments/Labs and Tests Ordered: Current medicines are reviewed at length with the patient today.  Concerns regarding medicines are outlined above. Medication changes, Labs and Tests ordered today are summarized above and listed in the Patient Instructions accessible in Encounters.   Signed, Eula Listen, PA-C 03/26/2023 2:25 PM     [facility] [address] [place], [place] [address] [phone]

## 2023-03-26 ENCOUNTER — Encounter: Payer: Self-pay | Admitting: Physician Assistant

## 2023-03-26 ENCOUNTER — Encounter (INDEPENDENT_AMBULATORY_CARE_PROVIDER_SITE_OTHER): Payer: Self-pay | Admitting: Nurse Practitioner

## 2023-03-26 ENCOUNTER — Ambulatory Visit (INDEPENDENT_AMBULATORY_CARE_PROVIDER_SITE_OTHER): Payer: Medicare Other

## 2023-03-26 ENCOUNTER — Ambulatory Visit: Payer: Medicare Other | Attending: Physician Assistant | Admitting: Physician Assistant

## 2023-03-26 ENCOUNTER — Ambulatory Visit (INDEPENDENT_AMBULATORY_CARE_PROVIDER_SITE_OTHER): Payer: Medicare Other | Admitting: Nurse Practitioner

## 2023-03-26 VITALS — BP 126/74 | HR 88 | Resp 17 | Ht 68.0 in | Wt 200.0 lb

## 2023-03-26 VITALS — BP 110/54 | HR 92 | Ht 68.0 in | Wt 201.0 lb

## 2023-03-26 DIAGNOSIS — E785 Hyperlipidemia, unspecified: Secondary | ICD-10-CM | POA: Diagnosis not present

## 2023-03-26 DIAGNOSIS — I2510 Atherosclerotic heart disease of native coronary artery without angina pectoris: Secondary | ICD-10-CM | POA: Diagnosis not present

## 2023-03-26 DIAGNOSIS — I639 Cerebral infarction, unspecified: Secondary | ICD-10-CM

## 2023-03-26 DIAGNOSIS — I714 Abdominal aortic aneurysm, without rupture, unspecified: Secondary | ICD-10-CM

## 2023-03-26 DIAGNOSIS — I5189 Other ill-defined heart diseases: Secondary | ICD-10-CM

## 2023-03-26 DIAGNOSIS — I890 Lymphedema, not elsewhere classified: Secondary | ICD-10-CM

## 2023-03-26 DIAGNOSIS — E1169 Type 2 diabetes mellitus with other specified complication: Secondary | ICD-10-CM | POA: Diagnosis not present

## 2023-03-26 DIAGNOSIS — I700 Atherosclerosis of aorta: Secondary | ICD-10-CM | POA: Diagnosis not present

## 2023-03-26 DIAGNOSIS — I77819 Aortic ectasia, unspecified site: Secondary | ICD-10-CM

## 2023-03-26 DIAGNOSIS — I2584 Coronary atherosclerosis due to calcified coronary lesion: Secondary | ICD-10-CM

## 2023-03-26 NOTE — Patient Instructions (Signed)
Medication Instructions:  No changes  *If you need a refill on your cardiac medications before your next appointment, please call your pharmacy*   Lab Work: none If you have labs (blood work) drawn today and your tests are completely normal, you will receive your results only by: MyChart Message (if you have MyChart) OR A paper copy in the mail If you have any lab test that is abnormal or we need to change your treatment, we will call you to review the results.   Testing/Procedures: none  Follow-Up: At [facility], you and your health needs are our priority.  As part of our continuing mission to provide you with exceptional heart care, we have created designated Provider Care Teams.  These Care Teams include your primary Cardiologist (physician) and Advanced Practice Providers (APPs -  Physician Assistants and Nurse Practitioners) who all work together to provide you with the care you need, when you need it.  We recommend signing up for the patient portal called "MyChart".  Sign up information is provided on this After Visit Summary.  MyChart is used to connect with patients for Virtual Visits (Telemedicine).  Patients are able to view lab/test results, encounter notes, upcoming appointments, etc.  Non-urgent messages can be sent to your provider as well.   To learn more about what you can do with MyChart, go to [email or web address].    Your next appointment:   6 month(s)  Provider:   Eula Listen, PA-C

## 2023-03-27 ENCOUNTER — Encounter (INDEPENDENT_AMBULATORY_CARE_PROVIDER_SITE_OTHER): Payer: Self-pay | Admitting: Nurse Practitioner

## 2023-03-27 NOTE — Progress Notes (Signed)
Subjective:    Patient ID: Sharon [name], female    DOB: May 25, 1946, 77 y.o.   MRN: [id number] Chief Complaint  Patient presents with   Follow-up    Sharon [name] is a 77 year old female that returns to the office for surveillance of a known abdominal aortic aneurysm. Patient denies abdominal pain or back pain, no other abdominal complaints. No changes suggesting embolic episodes.    There have been no interval changes in the patient's overall health care since his last visit.  She has known lymphedema as well and her swelling is generally well controlled currently.  No recurrent episodes of cellulitis   Patient denies amaurosis fugax or TIA symptoms. There is no history of claudication or rest pain symptoms of the lower extremities. The patient denies angina or shortness of breath.   The patient has a longstanding history of lymphedema with multiple episodes of cellulitis.  Lately her swelling has been under fairly good control.  There is some concern that she may have some neuropathy and will be having upcoming testing soon.   Duplex US of the aorta and iliac arteries shows an AAA measured 3.29.  The previous measurement on 02/27/2022 showed 3.28 cm.  Size essentially unchanged.    Review of Systems  Cardiovascular:  Positive for leg swelling.  All other systems reviewed and are negative.      Objective:   Physical Exam Vitals reviewed.  HENT:     Head: Normocephalic.  Cardiovascular:     Rate and Rhythm: Normal rate.     Pulses: Normal pulses.  Pulmonary:     Effort: Pulmonary effort is normal.  Skin:    General: Skin is warm and dry.  Neurological:     Mental Status: She is alert and oriented to person, place, and time.  Psychiatric:        Mood and Affect: Mood normal.        Behavior: Behavior normal.        Thought Content: Thought content normal.        Judgment: Judgment normal.     BP 126/74 (BP Location: Left Arm)   Pulse 88   Resp 17   Ht 5\' 8"  (1.727 m)    Wt 200 lb (90.7 kg)   LMP  (LMP Unknown)   BMI 30.41 kg/m   Past Medical History:  Diagnosis Date   Aneurysm of descending thoracic aorta (HCC)    Anxiety    Arthritis    hands, upper back   Asthma    Cervical cancer (HCC)    in situ 1970's   CHF (congestive heart failure) (HCC)    COPD (chronic obstructive pulmonary disease) (HCC)    Coronary artery disease    COVID    GERD (gastroesophageal reflux disease)    History of cervical cancer    HLD (hyperlipidemia)    Lymphedema    Menopausal disorder    Neuropathy    Osteoporosis    Pneumonia 1960   Pre-diabetes    PVD (peripheral vascular disease) (HCC)    Spasm of abdominal muscles of right side    intermittent   Stroke (HCC) 2019   TMJ (dislocation of temporomandibular joint)    Wears dentures    partial lower    Social History   Socioeconomic History   Marital status: Married    Spouse name: Garna Rotundo   Number of children: 1   Years of education: Not on file   Highest education level: Some  college, no degree  Occupational History   Occupation: retired  Tobacco Use   Smoking status: Former    Years: 56    Types: Cigarettes    Quit date: 07/14/2016    Years since quitting: 6.7   Smokeless tobacco: Never  Vaping Use   Vaping Use: Former  Substance and Sexual Activity   Alcohol use: Yes    Comment: Ambulance person   Drug use: No   Sexual activity: Not Currently    Birth control/protection: Post-menopausal  Other Topics Concern   Not on file  Social History Narrative   Lives with husband, manages farm   Social Determinants of Health   Financial Resource Strain: Low Risk  (06/03/2022)   Overall Financial Resource Strain (CARDIA)    Difficulty of Paying Living Expenses: Not hard at all  Food Insecurity: No Food Insecurity (07/03/2022)   Hunger Vital Sign    Worried About Running Out of Food in the Last Year: Never true    Ran Out of Food in the Last Year: Never true  Transportation Needs: No  Transportation Needs (07/03/2022)   PRAPARE - Administrator, Civil Service (Medical): No    Lack of Transportation (Non-Medical): No  Physical Activity: Inactive (06/03/2022)   Exercise Vital Sign    Days of Exercise per Week: 0 days    Minutes of Exercise per Session: 0 min  Stress: No Stress Concern Present (06/03/2022)   [employer] of Occupational Health - Occupational Stress Questionnaire    Feeling of Stress : Only a little  Social Connections: Moderately Isolated (06/03/2022)   Social Connection and Isolation Panel [NHANES]    Frequency of Communication with Friends and Family: More than three times a week    Frequency of Social Gatherings with Friends and Family: Never    Attends Religious Services: Never    Database administrator or Organizations: No    Attends Banker Meetings: Never    Marital Status: Married  Catering manager Violence: Not At Risk (07/03/2022)   Humiliation, Afraid, Rape, and Kick questionnaire    Fear of Current or Ex-Partner: No    Emotionally Abused: No    Physically Abused: No    Sexually Abused: No    Past Surgical History:  Procedure Laterality Date   ABDOMINAL HYSTERECTOMY  1970's   bladder botox  2005   BLADDER SUSPENSION  2004   CATARACT EXTRACTION W/PHACO Right 03/09/2018   Procedure: CATARACT EXTRACTION PHACO AND INTRAOCULAR LENS PLACEMENT (IOC) right;  Surgeon: Nevada Crane, MD;  Location: [facility];  Service: Ophthalmology;  Laterality: Right;  CALL CELL 1ST   CATARACT EXTRACTION W/PHACO Left 04/19/2018   Procedure: CATARACT EXTRACTION PHACO AND INTRAOCULAR LENS PLACEMENT (IOC)  LEFT;  Surgeon: Nevada Crane, MD;  Location: [facility];  Service: Ophthalmology;  Laterality: Left;   CHOLECYSTECTOMY, LAPAROSCOPIC  02/18/2023   COLONOSCOPY WITH PROPOFOL N/A 12/13/2015   Procedure: COLONOSCOPY WITH PROPOFOL;  Surgeon: Midge Minium, MD;  Location: [facility];  Service: Endoscopy;   Laterality: N/A;   COLONOSCOPY WITH PROPOFOL N/A 03/14/2021   Procedure: COLONOSCOPY WITH PROPOFOL;  Surgeon: Midge Minium, MD;  Location: [facility];  Service: Endoscopy;  Laterality: N/A;  diabetic   LOOP RECORDER INSERTION N/A 01/07/2018   Procedure: LOOP RECORDER INSERTION;  Surgeon: Duke Salvia, MD;  Location: [facility];  Service: Cardiovascular;  Laterality: N/A;   LOOP RECORDER REMOVAL     POLYPECTOMY N/A 12/13/2015  Procedure: POLYPECTOMY;  Surgeon: Midge Minium, MD;  Location: [facility];  Service: Endoscopy;  Laterality: N/A;  SIGMOID COLON POLYPS X  5   POLYPECTOMY N/A 03/14/2021   Procedure: POLYPECTOMY;  Surgeon: Midge Minium, MD;  Location: [facility];  Service: Endoscopy;  Laterality: N/A;   SHOULDER ARTHROSCOPY W/ ROTATOR CUFF REPAIR Right 1998   TEE WITHOUT CARDIOVERSION N/A 01/06/2018   Procedure: TRANSESOPHAGEAL ECHOCARDIOGRAM (TEE);  Surgeon: Antonieta Iba, MD;  Location: [facility];  Service: Cardiovascular;  Laterality: N/A;   TONSILLECTOMY AND ADENOIDECTOMY     TOOTH EXTRACTION     July 2023    Family History  Problem Relation Age of Onset   Diabetes Mother    Heart disease Mother    Stroke Mother    Stroke Maternal Grandmother    Hyperlipidemia Neg Hx     Allergies  Allergen Reactions   Statins Other (See Comments)    Myalgias and joint pain on atorvastatin 40mg  and rosuvastatin 20mg    Dermacerin [Aquaphilic]    Nsaids    Pineapple Itching    throat   Praluent [Alirocumab]     Flu like symptoms, leg swelling, rash   Quinolones     FLUOROQUINOLONES - Pt reports she was told to NEVER take these.   Baclofen Swelling   Bactrim [Sulfamethoxazole-Trimethoprim] Nausea And Vomiting   Ibuprofen Itching and Rash    Mouth swelling and itching   Lanolin Rash    Allergy to triamcinolone    Latex Rash    Some bandaids, some gloves; BLOOD TEST NEGATIVE   Librium [Chlordiazepoxide] Itching    Dizziness    Naprosyn  [Naproxen] Rash    Mouth swelling and swelling   Other Rash    Estonia nuts - mouth swelling       Latest Ref Rng & Units 01/13/2023    2:41 PM 12/15/2022   11:19 AM 10/15/2022   11:03 AM  CBC  WBC 3.4 - 10.8 x10E3/uL 8.1  10.1  6.6   Hemoglobin 11.1 - 15.9 g/dL 16.1  09.6  04.5   Hematocrit 34.0 - 46.6 % 43.1  45.2  47.4   Platelets 150 - 450 x10E3/uL 180  215  203       CMP     Component Value Date/Time   NA 139 01/13/2023 1441   K 4.2 01/13/2023 1441   CL 101 01/13/2023 1441   CO2 25 01/13/2023 1441   GLUCOSE 105 (H) 01/13/2023 1441   GLUCOSE 127 (H) 12/15/2022 1119   BUN 14 01/13/2023 1441   CREATININE 1.00 01/13/2023 1441   CALCIUM 8.9 01/13/2023 1441   PROT 6.3 01/13/2023 1441   ALBUMIN 4.1 01/13/2023 1441   AST 19 01/13/2023 1441   ALT 16 01/13/2023 1441   ALKPHOS 85 01/13/2023 1441   BILITOT 0.5 01/13/2023 1441   GFRNONAA >60 12/15/2022 1119   GFRAA 67 07/17/2020 1509     No results found.     Assessment & Plan:   1. Abdominal aortic aneurysm (AAA) without rupture, unspecified part (HCC) No surgery or intervention at this time. The patient has an asymptomatic abdominal aortic aneurysm that is less than 4 cm in maximal diameter.  I have discussed the natural history of abdominal aortic aneurysm and the small risk of rupture for aneurysm less than 5 cm in size.  However, as these small aneurysms tend to enlarge over time, continued surveillance with ultrasound or CT scan is mandatory.  I have also discussed optimizing medical  management with hypertension and lipid control and the importance of abstinence from tobacco.  The patient is also encouraged to exercise a minimum of 30 minutes 4 times a week.  Should the patient develop new onset abdominal or back pain or signs of peripheral embolization they are instructed to seek medical attention immediately and to alert the physician providing care that they have an aneurysm.  The patient voices their  understanding. The patient will return in 12 months with an aortic duplex per patient preference.  2. Lymphedema The patient's lymphedema is improved and she has been doing well.  I suspect that the burning stinging sensation she has is more so related to neuropathic symptoms.  She will continue to follow-up with neurology as scheduled.  3. Type 2 diabetes mellitus with obesity (HCC) Continue hypoglycemic medications as already ordered, these medications have been reviewed and there are no changes at this time.  Hgb A1C to be monitored as already arranged by primary service    Current Outpatient Medications on File Prior to Visit  Medication Sig Dispense Refill   acetaminophen (TYLENOL) 500 MG tablet Take 500 mg by mouth every 6 (six) hours as needed.     albuterol (PROAIR HFA) 108 (90 Base) MCG/ACT inhaler Inhale 2 puffs into the lungs every 6 (six) hours as needed for wheezing or shortness of breath. 18 g 4   aspirin EC 81 MG tablet Take 81 mg by mouth 3 (three) times a week. Swallow whole. M-W-Fr     Blood Glucose Monitoring Suppl (ONETOUCH VERIO) w/Device KIT Utilize to check blood sugar twice a day, fasting in morning with goal < 130 and then 2 hours after a meal with goal <180.  Document and bring to visits. 1 kit 0   BREZTRI AEROSPHERE 160-9-4.8 MCG/ACT AERO INHALE 2 PUFFS BY MOUTH TWICE DAILY AS DIRECTED 10.7 g 11   Cholecalciferol (VITAMIN D3) 50 MCG (2000 UT) CAPS Take 1 capsule by mouth daily.     citalopram (CELEXA) 20 MG tablet Take 1 tablet (20 mg total) by mouth as needed. (Patient taking differently: Take 20 mg by mouth as needed.) 90 tablet 4   clopidogrel (PLAVIX) 75 MG tablet Take 1 tablet (75 mg total) by mouth daily. 90 tablet 0   Crisaborole (EUCRISA) 2 % OINT Apply 1 application  topically daily. Use for itching and inflammation at left leg 100 g 2   Cyanocobalamin 1000 MCG/ML KIT Inject 1 vial as directed every 30 (thirty) days.     ezetimibe (ZETIA) 10 MG tablet Take  1 tablet (10 mg total) by mouth daily. (Patient taking differently: Take 10 mg by mouth at bedtime.) 30 tablet 11   glucose blood (ONETOUCH VERIO) test strip UTILIIZE TO CHECK BLOOD SUGAR TWICE DAILY FASTING IN MORNING WITH GOAL<130 AND THAN 2 HOURS AFTER A MEAL WITH GGOAL<180 100 strip 3   Lancets (ONETOUCH ULTRASOFT) lancets Utilize to check blood sugar twice a day, fasting in morning with goal < 130 and then 2 hours after a meal with goal <180.  Document and bring to visits. 100 each 12   loratadine (CLARITIN) 10 MG tablet TAKE 1 TABLET BY MOUTH EVERY DAY (Patient taking differently: Take 10 mg by mouth at bedtime.) 90 tablet 0   pantoprazole (PROTONIX) 40 MG tablet TAKE 1 TABLET(40 MG) BY MOUTH DAILY 90 tablet 2   rosuvastatin (CRESTOR) 5 MG tablet Take 1 tablet (5 mg total) by mouth daily. 30 tablet 5   No current facility-administered medications on  file prior to visit.    There are no Patient Instructions on file for this visit. No follow-ups on file.   Kris Hartmann, NP

## 2023-03-31 ENCOUNTER — Ambulatory Visit (INDEPENDENT_AMBULATORY_CARE_PROVIDER_SITE_OTHER): Payer: Medicare Other

## 2023-03-31 DIAGNOSIS — E538 Deficiency of other specified B group vitamins: Secondary | ICD-10-CM

## 2023-03-31 MED ORDER — CYANOCOBALAMIN 1000 MCG/ML IJ SOLN
1000.0000 ug | INTRAMUSCULAR | Status: AC
Start: 2023-03-31 — End: 2023-09-04
  Administered 2023-03-31 – 2023-09-04 (×6): 1000 ug via INTRAMUSCULAR

## 2023-04-02 ENCOUNTER — Other Ambulatory Visit: Payer: Self-pay | Admitting: *Deleted

## 2023-04-02 ENCOUNTER — Encounter: Payer: Self-pay | Admitting: Nurse Practitioner

## 2023-04-02 MED ORDER — LORATADINE 10 MG PO TABS: 10.0000 mg | ORAL_TABLET | Freq: Every evening | ORAL | 4 refills | Status: DC

## 2023-04-02 MED ORDER — ROSUVASTATIN CALCIUM 5 MG PO TABS: 5.0000 mg | ORAL_TABLET | Freq: Every day | ORAL | 3 refills | Status: DC

## 2023-04-02 NOTE — Telephone Encounter (Signed)
Patient is requesting a refill of her Claritin

## 2023-04-07 DIAGNOSIS — R202 Paresthesia of skin: Secondary | ICD-10-CM | POA: Diagnosis not present

## 2023-04-07 DIAGNOSIS — R200 Anesthesia of skin: Secondary | ICD-10-CM | POA: Diagnosis not present

## 2023-04-10 ENCOUNTER — Ambulatory Visit: Payer: Self-pay

## 2023-04-10 ENCOUNTER — Ambulatory Visit (INDEPENDENT_AMBULATORY_CARE_PROVIDER_SITE_OTHER): Payer: Medicare Other | Admitting: Physician Assistant

## 2023-04-10 ENCOUNTER — Encounter: Payer: Self-pay | Admitting: Physician Assistant

## 2023-04-10 VITALS — BP 121/73 | HR 85 | Temp 97.9°F | Wt 197.8 lb

## 2023-04-10 DIAGNOSIS — E1169 Type 2 diabetes mellitus with other specified complication: Secondary | ICD-10-CM

## 2023-04-10 DIAGNOSIS — J301 Allergic rhinitis due to pollen: Secondary | ICD-10-CM

## 2023-04-10 DIAGNOSIS — R42 Dizziness and giddiness: Secondary | ICD-10-CM | POA: Diagnosis not present

## 2023-04-10 LAB — URINALYSIS, ROUTINE W REFLEX MICROSCOPIC
Bilirubin, UA: NEGATIVE
Glucose, UA: NEGATIVE
Nitrite, UA: NEGATIVE
Protein,UA: NEGATIVE
RBC, UA: NEGATIVE
Specific Gravity, UA: <1.005 — ABNORMAL LOW (ref 1.005–1.030)
Urobilinogen, Ur: 0.2 mg/dL (ref 0.2–1.0)
pH, UA: 5 (ref 5.0–7.5)

## 2023-04-10 LAB — MICROSCOPIC EXAMINATION
Bacteria, UA: NONE SEEN
RBC, Urine: NONE SEEN /hpf (ref 0–2)

## 2023-04-10 LAB — WET PREP FOR TRICH, YEAST, CLUE
Clue Cell Exam: NEGATIVE
Trichomonas Exam: NEGATIVE
Yeast Exam: NEGATIVE

## 2023-04-10 MED ORDER — CETIRIZINE HCL 10 MG PO TABS
10.0000 mg | ORAL_TABLET | Freq: Every day | ORAL | 3 refills | Status: DC
Start: 2023-04-10 — End: 2024-05-16

## 2023-04-10 NOTE — Progress Notes (Unsigned)
Acute Office Visit   Patient: Sharon [name]   DOB: [birth year]   77 y.o. Female  MRN: [id number] Visit Date: 04/10/2023  Today's healthcare provider: Oswaldo Conroy Wardell Pokorski, PA-C  Introduced myself to the patient as a Secondary school teacher and provided education on APPs in clinical practice.    Chief Complaint  Patient presents with   Hyperglycemia    Pt states yesterday her BS read in the 190's, states it was 119 when she got up this morning and then 144 after eating some oatmeal with a quarter cup of milk. States she then ate a boiled egg and it was 156. States she has been feeling weak as well.    Subjective    HPI HPI     Hyperglycemia    Additional comments: Pt states yesterday her BS read in the 190's, states it was 119 when she got up this morning and then 144 after eating some oatmeal with a quarter cup of milk. States she then ate a boiled egg and it was 156. States she has been feeling weak as well.       Last edited by Pablo Ledger, CMA on 04/10/2023  1:15 PM.       Hyperglycemia Patient's most recent A1c was 6.5% in April 2024 - too early for recheck today She is not currently on medications for management but does check her glucose daily  She is worried because her sugars spiked to 190 yesterday after eating a bacon and egg croissant, oatmeal with raisins and milk in it, coffee.  She states this AM when she woke up it was 119 fasting and it is usually <110   She reports she felt dizzy after eating a boiled egg and her sugar jumped to 156  She reports she has felt dizzy yesterday and today only  She states she has only had two episodes of dizziness - one yesterday and one today- she did check her BP both times and they were 120s/70s  She reports she has been drinking half and half tea lately   She denies alcohol consumption in the last year  She reports some stinging with urination lately and states her urine color is different - she reports it looks cloudy        Medications: Outpatient Medications Prior to Visit  Medication Sig   acetaminophen (TYLENOL) 500 MG tablet Take 500 mg by mouth every 6 (six) hours as needed.   albuterol (PROAIR HFA) 108 (90 Base) MCG/ACT inhaler Inhale 2 puffs into the lungs every 6 (six) hours as needed for wheezing or shortness of breath.   aspirin EC 81 MG tablet Take 81 mg by mouth 3 (three) times a week. Swallow whole. M-W-Fr   Blood Glucose Monitoring Suppl (ONETOUCH VERIO) w/Device KIT Utilize to check blood sugar twice a day, fasting in morning with goal < 130 and then 2 hours after a meal with goal <180.  Document and bring to visits.   BREZTRI AEROSPHERE 160-9-4.8 MCG/ACT AERO INHALE 2 PUFFS BY MOUTH TWICE DAILY AS DIRECTED   Cholecalciferol (VITAMIN D3) 50 MCG (2000 UT) CAPS Take 1 capsule by mouth daily.   citalopram (CELEXA) 20 MG tablet Take 1 tablet (20 mg total) by mouth as needed. (Patient taking differently: Take 20 mg by mouth as needed.)   clopidogrel (PLAVIX) 75 MG tablet Take 1 tablet (75 mg total) by mouth daily.   Crisaborole (EUCRISA) 2 % OINT Apply 1 application  topically daily. Use for itching and inflammation at left leg   Cyanocobalamin 1000 MCG/ML KIT Inject 1 vial as directed every 30 (thirty) days.   ezetimibe (ZETIA) 10 MG tablet Take 1 tablet (10 mg total) by mouth daily. (Patient taking differently: Take 10 mg by mouth at bedtime.)   glucose blood (ONETOUCH VERIO) test strip UTILIIZE TO CHECK BLOOD SUGAR TWICE DAILY FASTING IN MORNING WITH GOAL<130 AND THAN 2 HOURS AFTER A MEAL WITH GGOAL<180   Lancets (ONETOUCH ULTRASOFT) lancets Utilize to check blood sugar twice a day, fasting in morning with goal < 130 and then 2 hours after a meal with goal <180.  Document and bring to visits.   pantoprazole (PROTONIX) 40 MG tablet TAKE 1 TABLET(40 MG) BY MOUTH DAILY   rosuvastatin (CRESTOR) 5 MG tablet Take 1 tablet (5 mg total) by mouth daily.   [DISCONTINUED] loratadine (CLARITIN) 10 MG  tablet Take 1 tablet (10 mg total) by mouth at bedtime.   Facility-Administered Medications Prior to Visit  Medication Dose Route Frequency Provider   cyanocobalamin (VITAMIN B12) injection 1,000 mcg  1,000 mcg Intramuscular Q30 days [name], Jolene T, NP    Review of Systems  Respiratory:  Negative for shortness of breath and wheezing.   Gastrointestinal:  Negative for diarrhea, nausea and vomiting.  Endocrine: Negative for polydipsia, polyphagia and polyuria.  Genitourinary:  Negative for dysuria.  Neurological:  Positive for dizziness, weakness and light-headedness. Negative for syncope.    {Labs  Heme  Chem  Endocrine  Serology  Results Review (optional):23779}   Objective    BP 121/73   Pulse 85   Temp 97.9 F (36.6 C) (Oral)   Wt 197 lb 12.8 oz (89.7 kg)   LMP  (LMP Unknown)   SpO2 98%   BMI 30.08 kg/m  {Show previous vital signs (optional):23777}  Physical Exam Vitals reviewed.  Constitutional:      General: She is awake.     Appearance: Normal appearance. She is well-developed and well-groomed.  HENT:     Head: Normocephalic and atraumatic.     Right Ear: Hearing, tympanic membrane and ear canal normal.     Left Ear: Hearing, tympanic membrane and ear canal normal.  Eyes:     General: Lids are normal. Gaze aligned appropriately.     Extraocular Movements: Extraocular movements intact.     Right eye: Normal extraocular motion and no nystagmus.     Left eye: Normal extraocular motion and no nystagmus.     Conjunctiva/sclera: Conjunctivae normal.     Pupils: Pupils are equal, round, and reactive to light.  Musculoskeletal:     Cervical back: Normal range of motion.  Neurological:     Mental Status: She is alert.  Psychiatric:        Behavior: Behavior is cooperative.       No results found for any visits on 04/10/23.  Assessment & Plan      No follow-ups on file.      Problem List Items Addressed This Visit       Respiratory   Allergic  rhinitis   Relevant Medications   cetirizine (ZYRTEC) 10 MG tablet     Endocrine   Type 2 diabetes mellitus with morbid obesity (HCC)    She reports concerns for elevated blood sugars after eating over       Other Visit Diagnoses     Dizziness    -  Primary   Relevant Orders   CBC w/Diff  Comp Met (CMET)   Urinalysis, Routine w reflex microscopic   WET PREP FOR TRICH, YEAST, CLUE   Lipase   Amylase        No follow-ups on file.   I, Ernesha Ramone E Jakki Doughty, PA-C, have reviewed all documentation for this visit. The documentation on 04/10/23 for the exam, diagnosis, procedures, and orders are all accurate and complete.   Jacquelin Hawking, MHS, PA-C [facility]

## 2023-04-10 NOTE — Assessment & Plan Note (Signed)
She reports concerns for elevated blood sugars after eating over the past few days We reviewed her recent meals- blood sugar elevations are consistent with the amount of carbs and sugar likely present in those foods- reassured her that this is normal response to intake of these foods We discussed that it is too early to recheck her A1c today but we can get blood work to check electrolytes, CBC and check pancreas  Results to dictate further management Follow up as needed for persistent or progressing symptoms

## 2023-04-10 NOTE — Telephone Encounter (Addendum)
      Chief Complaint: Blood glucose higher than normal x 2 days. This morning fasting 119. Usually 109. "I'm really concerned and I feel weak." Drinking fluids well. Asks to be seen today. Does not take medication for diabetes. Highest reading 190 yesterday. Symptoms: Above Frequency: 2 days Pertinent Negatives: Patient denies  Disposition: [] ED /[] Urgent Care (no appt availability in office) / [x] Appointment(In office/virtual)/ []  [facility]/ [] Home Care/ [] Refused Recommended Disposition /[]  Mobile Bus/ []  Follow-up with PCP Additional Notes:   Reason for Disposition  Blood glucose 70-240 mg/dL (3.9 -91.4 mmol/L)  Answer Assessment - Initial Assessment Questions 1. BLOOD GLUCOSE: "What is your blood glucose level?"      Fasting - 119 2. ONSET: "When did you check the blood glucose?"     Today 3. USUAL RANGE: "What is your glucose level usually?" (e.g., usual fasting morning value, usual evening value)     Fasting 4. KETONES: "Do you check for ketones (urine or blood test strips)?" If Yes, ask: "What does the test show now?"      No 5. TYPE 1 or 2:  "Do you know what type of diabetes you have?"  (e.g., Type 1, Type 2, Gestational; doesn't know)      Type 2 6. INSULIN: "Do you take insulin?" "What type of insulin(s) do you use? What is the mode of delivery? (syringe, pen; injection or pump)?"      No 7. DIABETES PILLS: "Do you take any pills for your diabetes?" If Yes, ask: "Have you missed taking any pills recently?"     No 8. OTHER SYMPTOMS: "Do you have any symptoms?" (e.g., fever, frequent urination, difficulty breathing, dizziness, weakness, vomiting)     Weak 9. PREGNANCY: "Is there any chance you are pregnant?" "When was your last menstrual period?"     No  Protocols used: Diabetes - High Blood Sugar-A-AH

## 2023-04-11 LAB — COMPREHENSIVE METABOLIC PANEL
ALT: 20 IU/L (ref 0–32)
AST: 21 IU/L (ref 0–40)
Albumin: 4.5 g/dL (ref 3.8–4.8)
Alkaline Phosphatase: 89 IU/L (ref 44–121)
BUN/Creatinine Ratio: 18 (ref 12–28)
BUN: 17 mg/dL (ref 8–27)
Bilirubin Total: 0.6 mg/dL (ref 0.0–1.2)
CO2: 22 mmol/L (ref 20–29)
Calcium: 9.3 mg/dL (ref 8.7–10.3)
Chloride: 103 mmol/L (ref 96–106)
Creatinine, Ser: 0.92 mg/dL (ref 0.57–1.00)
Globulin, Total: 2.2 g/dL (ref 1.5–4.5)
Glucose: 102 mg/dL — ABNORMAL HIGH (ref 70–99)
Potassium: 4.3 mmol/L (ref 3.5–5.2)
Sodium: 141 mmol/L (ref 134–144)
Total Protein: 6.7 g/dL (ref 6.0–8.5)
eGFR: 64 mL/min/{1.73_m2} (ref >59)

## 2023-04-11 LAB — CBC WITH DIFFERENTIAL/PLATELET
Basophils Absolute: 0 10*3/uL (ref 0.0–0.2)
Basos: 0 %
EOS (ABSOLUTE): 0 10*3/uL (ref 0.0–0.4)
Eos: 0 %
Hematocrit: 45.4 % (ref 34.0–46.6)
Hemoglobin: 15 g/dL (ref 11.1–15.9)
Immature Grans (Abs): 0 10*3/uL (ref 0.0–0.1)
Immature Granulocytes: 0 %
Lymphocytes Absolute: 1.3 10*3/uL (ref 0.7–3.1)
Lymphs: 14 %
MCH: 30.1 pg (ref 26.6–33.0)
MCHC: 33 g/dL (ref 31.5–35.7)
MCV: 91 fL (ref 79–97)
Monocytes Absolute: 0.6 10*3/uL (ref 0.1–0.9)
Monocytes: 6 %
Neutrophils Absolute: 7.4 10*3/uL — ABNORMAL HIGH (ref 1.4–7.0)
Neutrophils: 80 %
Platelets: 225 10*3/uL (ref 150–450)
RBC: 4.98 x10E6/uL (ref 3.77–5.28)
RDW: 12.8 % (ref 11.7–15.4)
WBC: 9.4 10*3/uL (ref 3.4–10.8)

## 2023-04-11 LAB — LIPASE: Lipase: 13 U/L — ABNORMAL LOW (ref 14–85)

## 2023-04-11 LAB — AMYLASE: Amylase: 29 U/L — ABNORMAL LOW (ref 31–110)

## 2023-04-13 DIAGNOSIS — R42 Dizziness and giddiness: Secondary | ICD-10-CM | POA: Diagnosis not present

## 2023-04-13 NOTE — Assessment & Plan Note (Signed)
Chronic, ongoing concern She reports new dizzy spells over the past few days along with some runny nose.  Recommend switching her antihistamine to Zyrtec from Claritin to provide further coverage. Follow up as needed for persistent or progressing symptoms

## 2023-04-14 ENCOUNTER — Ambulatory Visit (INDEPENDENT_AMBULATORY_CARE_PROVIDER_SITE_OTHER): Payer: Medicare Other | Admitting: Dermatology

## 2023-04-14 ENCOUNTER — Ambulatory Visit: Payer: Medicare Other | Admitting: Nurse Practitioner

## 2023-04-14 VITALS — BP 133/66 | HR 75

## 2023-04-14 DIAGNOSIS — I872 Venous insufficiency (chronic) (peripheral): Secondary | ICD-10-CM | POA: Diagnosis not present

## 2023-04-14 DIAGNOSIS — D692 Other nonthrombocytopenic purpura: Secondary | ICD-10-CM

## 2023-04-14 DIAGNOSIS — I83893 Varicose veins of bilateral lower extremities with other complications: Secondary | ICD-10-CM | POA: Diagnosis not present

## 2023-04-14 DIAGNOSIS — I8311 Varicose veins of right lower extremity with inflammation: Secondary | ICD-10-CM

## 2023-04-14 MED ORDER — EUCRISA 2 % EX OINT: 1.0000 | TOPICAL_OINTMENT | Freq: Every day | CUTANEOUS | 2 refills | Status: DC

## 2023-04-14 NOTE — Progress Notes (Signed)
   Follow-Up Visit   Subjective  Sharon [name] is a 77 y.o. female who presents for the following: follow up stasis dermatitis. Patient continue using eucrisa ointment to affected area, wearing compression stockings. Reports some burning and tingling when applying eucrisa would like to discuss.    The following portions of the chart were reviewed this encounter and updated as appropriate: medications, allergies, medical history  Review of Systems:  No other skin or systemic complaints except as noted in HPI or Assessment and Plan.  Objective  Well appearing patient in no apparent distress; mood and affect are within normal limits.   A focused examination was performed of the following areas: B/l lower legs  Relevant exam findings are noted in the Assessment and Plan.    Assessment & Plan   Stasis dermatitis of both legs B/L legs  Exam :  erythema and pitting edema at lower legs, worse at left than right   Chronic and persistent condition with duration or expected duration over one year. Condition is symptomatic/ bothersome to patient.  Improving but not to goal.     Patient reports skin allergy to elidel, lanolin, tmc, and is unsure of other allergies.    Stasis in the legs causes chronic leg swelling, which may result in itchy or painful rashes, skin discoloration, skin texture changes, and sometimes ulceration.  Recommend daily graduated compression hose/stockings- easiest to put on first thing in morning, remove at bedtime.  Elevate legs as much as possible. Avoid salt/sodium rich foods.   Continue Eucrisa 2% ointment  apply topically to affected areas qd/bid. Can store in fridge to help decrease burning after applying Cont compression hose qd Cont Lymphedema pump prn flares Cont CeraVe Itch-Relief and/or CeraVe SA prn Continue oral diuretic as prescribed   Varicose Veins/Spider Veins Continue follow up with [facility]  - Dilated blue, purple or red  veins at the lower extremities - Reassured - Smaller vessels can be treated by sclerotherapy (a procedure to inject a medicine into the veins to make them disappear) if desired, but the treatment is not covered by insurance. Larger vessels may be covered if symptomatic and Pt may address with her vascular surgeon if treatment desired.  Venous stasis dermatitis of left lower extremity  Related Medications Crisaborole (EUCRISA) 2 % OINT Apply 1 application  topically daily. Use for itching and inflammation at left leg  Purpura - Chronic; persistent and recurrent.  Treatable, but not curable. At b/l arms  - Violaceous macules and patches - Benign - Related to trauma, age, sun damage and/or use of blood thinners, chronic use of topical and/or oral steroids - Observe - Can use OTC arnica containing moisturizer such as Dermend Bruise Formula if desired - Call for worsening or other concerns   Return for 6 month stasis follow up.  I, Asher Muir, CMA, am acting as scribe for Willeen Niece, MD.   Documentation: I have reviewed the above documentation for accuracy and completeness, and I agree with the above.  Willeen Niece, MD

## 2023-04-14 NOTE — Patient Instructions (Addendum)
Dermend Bruise Formula if desired    Stasis in the legs causes chronic leg swelling, which may result in itchy or painful rashes, skin discoloration, skin texture changes, and sometimes ulceration.  Recommend daily graduated compression hose/stockings- easiest to put on first thing in morning, remove at bedtime.  Elevate legs as much as possible. Avoid salt/sodium rich foods.   Continue Eucrisa 2% ointment apply topically to affected areas daily to twice daily to b/l legs. Can place ointment in fridge to help with burning sensation to see if that helps.    Continue Juxtalite compression garment daily Continue Lymphedema pump several times a day Continue CeraVe Itch-Relief and/or CeraVe SA as needed      Due to recent changes in healthcare laws, you may see results of your pathology and/or laboratory studies on MyChart before the doctors have had a chance to review them. We understand that in some cases there may be results that are confusing or concerning to you. Please understand that not all results are received at the same time and often the doctors may need to interpret multiple results in order to provide you with the best plan of care or course of treatment. Therefore, we ask that you please give [place] 2 business days to thoroughly review all your results before contacting the office for clarification. Should we see a critical lab result, you will be contacted sooner.   If You Need Anything After Your Visit  If you have any questions or concerns for your doctor, please call our main line at [phone] and press option 4 to reach your doctor's medical assistant. If no one answers, please leave a voicemail as directed and we will return your call as soon as possible. Messages left after 4 pm will be answered the following business day.   You may also send [place] a message via MyChart. We typically respond to MyChart messages within 1-2 business days.  For prescription refills, please ask your  pharmacy to contact our office. Our fax number is [phone].  If you have an urgent issue when the clinic is closed that cannot wait until the next business day, you can page your doctor at the number below.    Please note that while we do our best to be available for urgent issues outside of office hours, we are not available 24/7.   If you have an urgent issue and are unable to reach [place], you may choose to seek medical care at your doctor's office, retail clinic, urgent care center, or emergency room.  If you have a medical emergency, please immediately call 911 or go to the emergency department.  Pager Numbers  - Dr. Gwen Pounds: [phone]  - Dr. Neale Burly: [phone]  - Dr. Roseanne Reno: [phone]  In the event of inclement weather, please call our main line at [phone] for an update on the status of any delays or closures.  Dermatology Medication Tips: Please keep the boxes that topical medications come in in order to help keep track of the instructions about where and how to use these. Pharmacies typically print the medication instructions only on the boxes and not directly on the medication tubes.   If your medication is too expensive, please contact our office at [phone] option 4 or send [place] a message through MyChart.   We are unable to tell what your co-pay for medications will be in advance as this is different depending on your insurance coverage. However, we may be able to find a substitute medication at  lower cost or fill out paperwork to get insurance to cover a needed medication.   If a prior authorization is required to get your medication covered by your insurance company, please allow [place] 1-2 business days to complete this process.  Drug prices often vary depending on where the prescription is filled and some pharmacies may offer cheaper prices.  The website www.goodrx.com contains coupons for medications through different pharmacies. The prices here do not  account for what the cost may be with help from insurance (it may be cheaper with your insurance), but the website can give you the price if you did not use any insurance.  - You can print the associated coupon and take it with your prescription to the pharmacy.  - You may also stop by our office during regular business hours and pick up a GoodRx coupon card.  - If you need your prescription sent electronically to a different pharmacy, notify our office through [facility] or by phone at [phone] option 4.     Si Usted Necesita Algo Despus de Su Visita  Tambin puede enviarnos un mensaje a travs de Clinical cytogeneticist. Por lo general respondemos a los mensajes de MyChart en el transcurso de 1 a 2 das hbiles.  Para renovar recetas, por favor pida a su farmacia que se ponga en contacto con nuestra oficina. Annie Sable de fax es [place] [phone].  Si tiene un asunto urgente cuando la clnica est cerrada y que no puede esperar hasta el siguiente da hbil, puede llamar/localizar a su doctor(a) al nmero que aparece a continuacin.   Por favor, tenga en cuenta que aunque hacemos todo lo posible para estar disponibles para asuntos urgentes fuera del horario de [place], no estamos disponibles las 24 horas del da, los 7 [address]  [address] de la [place].   Si tiene un problema urgente y no puede comunicarse con nosotros, puede optar por buscar atencin mdica  en el consultorio de su doctor(a), en una clnica privada, en un centro de atencin urgente o en una sala de emergencias.  Si tiene Engineer, drilling, por favor llame inmediatamente al 911 o vaya a la sala de emergencias.  Nmeros de bper  - Dr. Gwen Pounds: [phone]  - Dra. Moye: [phone]  - Dra. Roseanne Reno: [phone]  En caso de inclemencias del [place], por favor llame a Lacy Duverney principal al [phone] para una actualizacin sobre el [place] de cualquier retraso o cierre.  Consejos para la medicacin en dermatologa: Por  favor, guarde las cajas en las que vienen los medicamentos de uso tpico para ayudarle a seguir las instrucciones sobre dnde y cmo usarlos. Las farmacias generalmente imprimen las instrucciones del medicamento slo en las cajas y no directamente en los tubos del [place].   Si su medicamento es muy caro, por favor, pngase en contacto con Rolm Gala llamando al [phone] y presione la opcin 4 o envenos un mensaje a travs de Clinical cytogeneticist.   No podemos decirle cul ser su copago por los medicamentos por adelantado ya que esto es diferente dependiendo de la cobertura de su seguro. Sin embargo, es posible que podamos encontrar un medicamento sustituto a Audiological scientist un formulario para que el seguro cubra el medicamento que se considera necesario.   Si se requiere una autorizacin previa para que su compaa de seguros [place] su medicamento, por favor permtanos de 1 a 2 das hbiles para completar [address]  Los precios de los medicamentos varan con frecuencia dependiendo del Environmental consultant de dnde se  surte la receta y alguna farmacias pueden ofrecer precios ms baratos.  El sitio web www.goodrx.com tiene cupones para medicamentos de Airline pilot. Los precios aqu no tienen en cuenta lo que podra costar con la ayuda del seguro (puede ser ms barato con su seguro), pero el sitio web puede darle el precio si no utiliz Research scientist (physical sciences).  - Puede imprimir el cupn correspondiente y llevarlo con su receta a la farmacia.  - Tambin puede pasar por nuestra oficina durante el horario de atencin regular y Charity fundraiser una tarjeta de cupones de GoodRx.  - Si necesita que su receta se enve electrnicamente a una farmacia diferente, informe a nuestra oficina a travs de MyChart de [place] o por telfono llamando al [phone] y presione la opcin 4.

## 2023-04-14 NOTE — Progress Notes (Signed)
Your labs are back Your electrolytes, liver and kidney function were in normal limits There does not appear to be any concerning findings regarding your pancreas.  Your blood count looks normal- no signs of anemia Your urine did have some trace white blood cells but no bacteria or blood. We can repeat this if you are still having concerns for a UTI.  Your cervicovaginal swab did not show signs of bacterial vaginosis, yeast or trichomonas.  I think your dizziness may have been caused by the inner ear fluid- please proceed with the recommendations made at your apt and let us know if you have further questions or concerns.

## 2023-04-15 LAB — URINE CULTURE

## 2023-04-19 DIAGNOSIS — E1142 Type 2 diabetes mellitus with diabetic polyneuropathy: Secondary | ICD-10-CM | POA: Insufficient documentation

## 2023-04-19 NOTE — Patient Instructions (Signed)
Be Involved in Caring For Your Health:  Taking Medications When medications are taken as directed, they can greatly improve your health. But if they are not taken as prescribed, they may not work. In some cases, not taking them correctly can be harmful. To help ensure your treatment remains effective and safe, understand your medications and how to take them. Bring your medications to each visit for review by your provider.  Your lab results, notes, and after visit summary will be available on My Chart. We strongly encourage you to use this feature. If lab results are abnormal the clinic will contact you with the appropriate steps. If the clinic does not contact you assume the results are satisfactory. You can always view your results on My Chart. If you have questions regarding your health or results, please contact the clinic during office hours. You can also ask questions on My Chart.  We at [facility] are grateful that you chose us to provide your care. We strive to provide evidence-based and compassionate care and are always looking for feedback. If you get a survey from the clinic please complete this so we can hear your opinions.  Diabetes Mellitus Basics  Diabetes mellitus, or diabetes, is a long-term (chronic) disease. It occurs when the body does not properly use sugar (glucose) that is released from food after you eat. Diabetes mellitus may be caused by one or both of these problems: Your pancreas does not make enough of a hormone called insulin. Your body does not react in a normal way to the insulin that it makes. Insulin lets glucose enter cells in your body. This gives you energy. If you have diabetes, glucose cannot get into cells. This causes high blood glucose (hyperglycemia). How to treat and manage diabetes You may need to take insulin or other diabetes medicines daily to keep your glucose in balance. If you are prescribed insulin, you will learn how to give  yourself insulin by injection. You may need to adjust the amount of insulin you take based on the foods that you eat. You will need to check your blood glucose levels using a glucose monitor as told by your health care provider. The readings can help determine if you have low or high blood glucose. Generally, you should have these blood glucose levels: Before meals (preprandial): 80-130 mg/dL (4.4-7.2 mmol/L). After meals (postprandial): below 180 mg/dL (10 mmol/L). Hemoglobin A1c (HbA1c) level: less than 7%. Your health care provider will set treatment goals for you. Keep all follow-up visits. This is important. Follow these instructions at home: Diabetes medicines Take your diabetes medicines every day as told by your health care provider. List your diabetes medicines here: Name of medicine: ______________________________ Amount (dose): _______________ Time (a.m./p.m.): _______________ Notes: ___________________________________ Name of medicine: ______________________________ Amount (dose): _______________ Time (a.m./p.m.): _______________ Notes: ___________________________________ Name of medicine: ______________________________ Amount (dose): _______________ Time (a.m./p.m.): _______________ Notes: ___________________________________ Insulin If you use insulin, list the types of insulin you use here: Insulin type: ______________________________ Amount (dose): _______________ Time (a.m./p.m.): _______________Notes: ___________________________________ Insulin type: ______________________________ Amount (dose): _______________ Time (a.m./p.m.): _______________ Notes: ___________________________________ Insulin type: ______________________________ Amount (dose): _______________ Time (a.m./p.m.): _______________ Notes: ___________________________________ Insulin type: ______________________________ Amount (dose): _______________ Time (a.m./p.m.): _______________ Notes:  ___________________________________ Insulin type: ______________________________ Amount (dose): _______________ Time (a.m./p.m.): _______________ Notes: ___________________________________ Managing blood glucose  Check your blood glucose levels using a glucose monitor as told by your health care provider. Write down the times that you check your glucose levels here: Time: _______________ Notes: ___________________________________   Time: _______________ Notes: ___________________________________ Time: _______________ Notes: ___________________________________ Time: _______________ Notes: ___________________________________ Time: _______________ Notes: ___________________________________ Time: _______________ Notes: ___________________________________  Low blood glucose Low blood glucose (hypoglycemia) is when glucose is at or below 70 mg/dL (3.9 mmol/L). Symptoms may include: Feeling: Hungry. Sweaty and clammy. Irritable or easily upset. Dizzy. Sleepy. Having: A fast heartbeat. A headache. A change in your vision. Numbness around the mouth, lips, or tongue. Having trouble with: Moving (coordination). Sleeping. Treating low blood glucose To treat low blood glucose, eat or drink something containing sugar right away. If you can think clearly and swallow safely, follow the 15:15 rule: Take 15 grams of a fast-acting carb (carbohydrate), as told by your health care provider. Some fast-acting carbs are: Glucose tablets: take 3-4 tablets. Hard candy: eat 3-5 pieces. Fruit juice: drink 4 oz (120 mL). Regular (not diet) soda: drink 4-6 oz (120-180 mL). Honey or sugar: eat 1 Tbsp (15 mL). Check your blood glucose levels 15 minutes after you take the carb. If your glucose is still at or below 70 mg/dL (3.9 mmol/L), take 15 grams of a carb again. If your glucose does not go above 70 mg/dL (3.9 mmol/L) after 3 tries, get help right away. After your glucose goes back to normal, eat a meal  or a snack within 1 hour. Treating very low blood glucose If your glucose is at or below 54 mg/dL (3 mmol/L), you have very low blood glucose (severe hypoglycemia). This is an emergency. Do not wait to see if the symptoms will go away. Get medical help right away. Call your local emergency services (911 in the [place] Do not drive yourself to the hospital. Questions to ask your health care provider Should I talk with a diabetes educator? What equipment will I need to care for myself at home? What diabetes medicines do I need? When should I take them? How often do I need to check my blood glucose levels? What number can I call if I have questions? When is my follow-up visit? Where can I find a support group for people with diabetes? Where to find more information American Diabetes Association: www.diabetes.org Association of Diabetes Care and Education Specialists: www.diabeteseducator.org Contact a health care provider if: Your blood glucose is at or above 240 mg/dL (13.3 mmol/L) for 2 days in a row. You have been sick or have had a fever for 2 days or more, and you are not getting better. You have any of these problems for more than 6 hours: You cannot eat or drink. You feel nauseous. You vomit. You have diarrhea. Get help right away if: Your blood glucose is lower than 54 mg/dL (3 mmol/L). You get confused. You have trouble thinking clearly. You have trouble breathing. These symptoms may represent a serious problem that is an emergency. Do not wait to see if the symptoms will go away. Get medical help right away. Call your local emergency services (911 in the [place] Do not drive yourself to the hospital. Summary Diabetes mellitus is a chronic disease that occurs when the body does not properly use sugar (glucose) that is released from food after you eat. Take insulin and diabetes medicines as told. Check your blood glucose every day, as often as told. Keep all follow-up visits. This  is important. This information is not intended to replace advice given to you by your health care provider. Make sure you discuss any questions you have with your health care provider. Document Revised: 01/31/2020 Document Reviewed: 01/31/2020 Elsevier Patient Education    2024 Elsevier Inc.  

## 2023-04-21 ENCOUNTER — Ambulatory Visit (INDEPENDENT_AMBULATORY_CARE_PROVIDER_SITE_OTHER): Payer: Medicare Other | Admitting: Nurse Practitioner

## 2023-04-21 ENCOUNTER — Encounter: Payer: Self-pay | Admitting: Nurse Practitioner

## 2023-04-21 DIAGNOSIS — I7123 Aneurysm of the descending thoracic aorta, without rupture: Secondary | ICD-10-CM | POA: Diagnosis not present

## 2023-04-21 DIAGNOSIS — J432 Centrilobular emphysema: Secondary | ICD-10-CM

## 2023-04-21 DIAGNOSIS — I5032 Chronic diastolic (congestive) heart failure: Secondary | ICD-10-CM | POA: Diagnosis not present

## 2023-04-21 DIAGNOSIS — E1169 Type 2 diabetes mellitus with other specified complication: Secondary | ICD-10-CM | POA: Diagnosis not present

## 2023-04-21 DIAGNOSIS — I890 Lymphedema, not elsewhere classified: Secondary | ICD-10-CM | POA: Diagnosis not present

## 2023-04-21 DIAGNOSIS — E1351 Other specified diabetes mellitus with diabetic peripheral angiopathy without gangrene: Secondary | ICD-10-CM

## 2023-04-21 DIAGNOSIS — E559 Vitamin D deficiency, unspecified: Secondary | ICD-10-CM

## 2023-04-21 DIAGNOSIS — E1142 Type 2 diabetes mellitus with diabetic polyneuropathy: Secondary | ICD-10-CM

## 2023-04-21 DIAGNOSIS — F419 Anxiety disorder, unspecified: Secondary | ICD-10-CM

## 2023-04-21 DIAGNOSIS — E538 Deficiency of other specified B group vitamins: Secondary | ICD-10-CM | POA: Diagnosis not present

## 2023-04-21 DIAGNOSIS — E6609 Other obesity due to excess calories: Secondary | ICD-10-CM

## 2023-04-21 DIAGNOSIS — Z8673 Personal history of transient ischemic attack (TIA), and cerebral infarction without residual deficits: Secondary | ICD-10-CM

## 2023-04-21 DIAGNOSIS — D692 Other nonthrombocytopenic purpura: Secondary | ICD-10-CM | POA: Diagnosis not present

## 2023-04-21 DIAGNOSIS — E785 Hyperlipidemia, unspecified: Secondary | ICD-10-CM | POA: Diagnosis not present

## 2023-04-21 MED ORDER — EZETIMIBE 10 MG PO TABS: 10.0000 mg | ORAL_TABLET | Freq: Every day | ORAL | 4 refills | Status: DC

## 2023-04-21 MED ORDER — ONETOUCH ULTRASOFT LANCETS MISC: 12 refills | Status: AC

## 2023-04-21 NOTE — Assessment & Plan Note (Signed)
Recent EF 55-60% on 07/03/22. Euvolemic.  Continue collaboration with cardiology.  Recommend: - Reminded to call for an overnight weight gain of >2 pounds or a weekly weight gain of >5 pounds - not adding salt to food and read food labels. Reviewed the importance of keeping daily sodium intake to <2000mg daily. - Avoid Ibuprofen products 

## 2023-04-21 NOTE — Assessment & Plan Note (Signed)
Chronic, stable.  Continue monthly injections.  Check level today. 

## 2023-04-21 NOTE — Assessment & Plan Note (Signed)
Chronic, stable, only uses PRN.  Continue daily Citalopram and monitor.  Adjust dose or medication as needed, do not increase Celexa to 40 MG due to patient age >65.  Denies SI/HI. ?

## 2023-04-21 NOTE — Progress Notes (Signed)
BP 120/60   Pulse 81   Temp 98 F (36.7 C)   Ht 5' 7.99" (1.727 m)   Wt 198 lb 9.6 oz (90.1 kg)   LMP  (LMP Unknown)   SpO2 98%   BMI 30.20 kg/m    Subjective:    Patient ID: Sharon [name], female    DOB: May 19, 1946, 77 y.o.   MRN: [id number]  HPI: Sharon [name] is a 77 y.o. female  Chief Complaint  Patient presents with   Diabetes   Hypertension   Hyperlipidemia   COPD   lymphedema   Ear Pain   DIABETES A1c April 6.5%.  No current medications, was on Metformin which caused discomfort.  Continues diet focus.  Has cut out white bread at this time and bacon.  Taking monthly injections for B12.  Vitamin D deficiency, continues supplement with levels remaining stable. Hypoglycemic episodes:no Polydipsia/polyuria: no Visual disturbance: no Chest pain: no Paresthesias: no Glucose Monitoring: yes             Accucheck frequency: Daily -- had one 190 level recently             Fasting glucose: 107 to 100 range -- 112 to 119 last few mornings             Post prandial:             Evening: 140 range             Before meals: Taking Insulin?: no             Long acting insulin:             Short acting insulin: Blood Pressure Monitoring: not checking Retinal Examination: Up to Date Foot Exam: Up to Date Pneumovax: Up to Date Influenza: Up To Date Aspirin: no    COPD Using Breztri daily and Albuterol PRN.  In past has taken [employer] and Symbicort.  Reports her right ear has been bothering her, has seen ENT in past for this and reassuring findings.  Annual lung screening last on 08/01/22 noting mild centrilobular emphysema and aortic atherosclerosis.   COPD status: exacerbated Satisfied with current treatment?: yes Oxygen use: no Dyspnea frequency: occasional Cough frequency: occasional Rescue inhaler frequency: every morning during this heat Limitation of activity: no Productive cough: none Last Spirometry: unknown Pneumovax: Up to Date Influenza: Up to Date     CHRONIC KIDNEY DISEASE Recent labs were stable. CKD status: stable Medications renally dose: yes Previous renal evaluation: no Pneumovax:  Up to Date Influenza Vaccine:  Up to Date    HYPERLIPIDEMIA Last saw cardiology 03/26/23.  Continues to tolerate Rosuvastatin 5 MG and Zetia with benefit. Tried Praluent in past, which caused worsening joint pain -- same with statins was unable to take these. Has an abdominal aortic ectatic that is < 4 cm, which is being monitored by vascular, last visit 09/02/22.  Has pacemaker in place and has checks with cardiology.  Is bruising easily -- continues on Plavix and ASA daily due to CVA history.  History of CVA in 2019 and then 07/02/22.  Had visit with neurology last on 03/12/23, was prescribed Gabapentin but has not taken yet due to concerns for side effects.  Had testing recently noticing chronic severe sensory polyneuropathy.  Hyperlipidemia status: good compliance Satisfied with current treatment?  yes Side effects:  no Medication compliance: good compliance Supplements: none Aspirin:  no The ASCVD Risk score (Arnett DK, et al., 2019) failed to calculate  for the following reasons:   The patient has a prior MI or stroke diagnosis Chest pain:  no Coronary artery disease:  no Family history CAD:  no Family history early CAD:  no    LYMPHEDEMA WITH CHRONIC VENOUS INSUFFICIENCY: Does take Tylenol for pain, which benefits at times.  Continues to use lymphedema pumps at home. Dermatology last 04/14/23 and vascular 03/26/23.  Previous treatments for cellulitis. Duration: months Pain: yes Quality: dull, aching, and throbbing Frequency: intermittent Context:  fluctuating Decreased function/range of motion: at times Erythema: none Swelling: yes, stable Heat or warmth: none Morning stiffness: yes unsure how long this lasts  DEPRESSION Continues on Celexa 20 MG as needed for mood, uses for when driving down highway -- reports this helps on a PRN  basis. Mood status: stable Satisfied with current treatment?: yes Symptom severity: mild  Duration of current treatment : chronic Side effects: no Medication compliance: good compliance Psychotherapy/counseling: none Depressed mood: no Anxious mood: no Anhedonia: no Significant weight loss or gain: no Insomnia: none Fatigue: no Feelings of worthlessness or guilt: no Impaired concentration/indecisiveness: no Suicidal ideations: no Hopelessness: no Crying spells: no    04/21/2023    4:37 PM 10/07/2022    1:11 PM 06/03/2022    9:24 AM 06/03/2022    8:57 AM 12/31/2021    1:44 PM  Depression screen PHQ 2/9  Decreased Interest 1 0 0 0 0  Down, Depressed, Hopeless 1 0 0 0 0  PHQ - 2 Score 2 0 0 0 0  Altered sleeping 0 1   0  Tired, decreased energy 0 1   1  Change in appetite 0 0   0  Feeling bad or failure about yourself  0 0   0  Trouble concentrating 0 0   0  Moving slowly or fidgety/restless 0 1   0  Suicidal thoughts 0 0   0  PHQ-9 Score 2 3   1   Difficult doing work/chores  Not difficult at all   Not difficult at all       04/21/2023    4:37 PM 01/15/2021    1:37 PM 12/07/2018    1:22 PM  GAD 7 : Generalized Anxiety Score  Nervous, Anxious, on Edge 1 3 0  Control/stop worrying 1 3 0  Worry too much - different things 1 3 0  Trouble relaxing 0 0 0  Restless 0 0 0  Easily annoyed or irritable 0 1 0  Afraid - awful might happen 1 0 0  Total GAD 7 Score 4 10 0  Anxiety Difficulty Not difficult at all      Relevant past medical, surgical, family and social history reviewed and updated as indicated. Interim medical history since our last visit reviewed. Allergies and medications reviewed and updated.  Review of Systems  Constitutional:  Negative for activity change, appetite change, diaphoresis, fatigue and fever.  HENT:  Positive for ear pain.   Respiratory:  Negative for cough, chest tightness, shortness of breath and wheezing.   Cardiovascular:  Positive for leg  swelling (at baseline). Negative for chest pain and palpitations.  Gastrointestinal: Negative.   Endocrine: Negative for cold intolerance, heat intolerance, polydipsia, polyphagia and polyuria.  Neurological:  Positive for numbness (to lower extremities). Negative for dizziness, syncope and weakness.  Psychiatric/Behavioral: Negative.     Per HPI unless specifically indicated above     Objective:    BP 120/60   Pulse 81   Temp 98 F (36.7 C)  Ht 5' 7.99" (1.727 m)   Wt 198 lb 9.6 oz (90.1 kg)   LMP  (LMP Unknown)   SpO2 98%   BMI 30.20 kg/m   Wt Readings from Last 3 Encounters:  04/21/23 198 lb 9.6 oz (90.1 kg)  04/10/23 197 lb 12.8 oz (89.7 kg)  03/26/23 201 lb (91.2 kg)    Physical Exam Vitals and nursing note reviewed.  Constitutional:      General: She is awake. She is not in acute distress.    Appearance: She is well-developed and well-groomed. She is obese. She is not ill-appearing or toxic-appearing.  HENT:     Head: Normocephalic.     Right Ear: Hearing, tympanic membrane, ear canal and external ear normal. No drainage. There is no impacted cerumen. Tympanic membrane is not injected.     Left Ear: Hearing, tympanic membrane, ear canal and external ear normal. No drainage. There is no impacted cerumen. Tympanic membrane is not injected.     Nose: Nose normal.     Mouth/Throat:     Mouth: Mucous membranes are moist.  Eyes:     General: Lids are normal.        Right eye: No discharge.        Left eye: No discharge.     Conjunctiva/sclera: Conjunctivae normal.  Neck:     Thyroid: No thyromegaly.     Vascular: No carotid bruit.  Cardiovascular:     Rate and Rhythm: Normal rate and regular rhythm.     Heart sounds: No murmur heard.    No gallop.  Pulmonary:     Effort: Pulmonary effort is normal. No accessory muscle usage or respiratory distress.     Breath sounds: Normal breath sounds. No decreased breath sounds, wheezing or rhonchi.  Musculoskeletal:      Cervical back: Normal range of motion.     Right lower leg: 1+ Edema present.     Left lower leg: 1+ Edema present.  Lymphadenopathy:     Cervical: No cervical adenopathy.  Skin:    Findings: Bruising (moderate noted to upper extremities.) present.  Neurological:     Mental Status: She is alert and oriented to person, place, and time.  Psychiatric:        Attention and Perception: Attention normal.        Mood and Affect: Mood normal.        Speech: Speech normal.        Behavior: Behavior normal. Behavior is cooperative.        Thought Content: Thought content normal.    Diabetic Foot Exam - Simple   Simple Foot Form Visual Inspection See comments: Yes Sensation Testing See comments: Yes Pulse Check Posterior Tibialis and Dorsalis pulse intact bilaterally: Yes Comments Slightly dry skin bilateral.  Sensation reduced bilaterally.      Results for orders placed or performed in visit on 04/10/23  WET PREP FOR TRICH, YEAST, CLUE   Specimen: Urine   Urine  Result Value Ref Range   Trichomonas Exam Negative Negative   Yeast Exam Negative Negative   Clue Cell Exam Negative Negative  Urine Culture   Specimen: Urine   UR  Result Value Ref Range   Urine Culture, Routine Final report    Organism ID, Bacteria Comment   Microscopic Examination   Urine  Result Value Ref Range   WBC, UA 0-5 0 - 5 /hpf   RBC, Urine None seen 0 - 2 /hpf   Epithelial Cells (non  renal) 0-10 0 - 10 /hpf   Bacteria, UA None seen None seen/Few  CBC w/Diff  Result Value Ref Range   WBC 9.4 3.4 - 10.8 x10E3/uL   RBC 4.98 3.77 - 5.28 x10E6/uL   Hemoglobin 15.0 11.1 - 15.9 g/dL   Hematocrit 09.8 11.9 - 46.6 %   MCV 91 79 - 97 fL   MCH 30.1 26.6 - 33.0 pg   MCHC 33.0 31.5 - 35.7 g/dL   RDW 14.7 82.9 - 56.2 %   Platelets 225 150 - 450 x10E3/uL   Neutrophils 80 Not Estab. %   Lymphs 14 Not Estab. %   Monocytes 6 Not Estab. %   Eos 0 Not Estab. %   Basos 0 Not Estab. %   Neutrophils Absolute  7.4 (H) 1.4 - 7.0 x10E3/uL   Lymphocytes Absolute 1.3 0.7 - 3.1 x10E3/uL   Monocytes Absolute 0.6 0.1 - 0.9 x10E3/uL   EOS (ABSOLUTE) 0.0 0.0 - 0.4 x10E3/uL   Basophils Absolute 0.0 0.0 - 0.2 x10E3/uL   Immature Granulocytes 0 Not Estab. %   Immature Grans (Abs) 0.0 0.0 - 0.1 x10E3/uL  Comp Met (CMET)  Result Value Ref Range   Glucose 102 (H) 70 - 99 mg/dL   BUN 17 8 - 27 mg/dL   Creatinine, Ser 1.30 0.57 - 1.00 mg/dL   eGFR 64 >86 VH/QIO/9.62   BUN/Creatinine Ratio 18 12 - 28   Sodium 141 134 - 144 mmol/L   Potassium 4.3 3.5 - 5.2 mmol/L   Chloride 103 96 - 106 mmol/L   CO2 22 20 - 29 mmol/L   Calcium 9.3 8.7 - 10.3 mg/dL   Total Protein 6.7 6.0 - 8.5 g/dL   Albumin 4.5 3.8 - 4.8 g/dL   Globulin, Total 2.2 1.5 - 4.5 g/dL   Bilirubin Total 0.6 0.0 - 1.2 mg/dL   Alkaline Phosphatase 89 44 - 121 IU/L   AST 21 0 - 40 IU/L   ALT 20 0 - 32 IU/L  Urinalysis, Routine w reflex microscopic  Result Value Ref Range   Specific Gravity, UA <1.005 (L) 1.005 - 1.030   pH, UA 5.0 5.0 - 7.5   Color, UA Yellow Yellow   Appearance Ur Clear Clear   Leukocytes,UA Trace (A) Negative   Protein,UA Negative Negative/Trace   Glucose, UA Negative Negative   Ketones, UA Trace (A) Negative   RBC, UA Negative Negative   Bilirubin, UA Negative Negative   Urobilinogen, Ur 0.2 0.2 - 1.0 mg/dL   Nitrite, UA Negative Negative   Microscopic Examination See below:   Lipase  Result Value Ref Range   Lipase 13 (L) 14 - 85 U/L  Amylase  Result Value Ref Range   Amylase 29 (L) 31 - 110 U/L   *Note: Due to a large number of results and/or encounters for the requested time period, some results have not been displayed. A complete set of results can be found in Results Review.      Assessment & Plan:   Problem List Items Addressed This Visit       Cardiovascular and Mediastinum   Aneurysm of descending thoracic aorta (HCC)    Followed by vascular, last CT scan showed no significant increase in size.   Due for CT scan lung yearly.  Continue to collaborate with vascular.      Relevant Medications   ezetimibe (ZETIA) 10 MG tablet   Other Relevant Orders   Comprehensive metabolic panel   Lipid Panel w/o Chol/HDL  Ratio   Chronic diastolic CHF (congestive heart failure) (HCC)    Recent EF 55-60% on 07/03/22. Euvolemic.  Continue collaboration with cardiology.  Recommend: - Reminded to call for an overnight weight gain of >2 pounds or a weekly weight gain of >5 pounds - not adding salt to food and read food labels. Reviewed the importance of keeping daily sodium intake to 2000mg  daily. - Avoid Ibuprofen products      Relevant Medications   ezetimibe (ZETIA) 10 MG tablet   Other Relevant Orders   Comprehensive metabolic panel   Peripheral vascular disease due to secondary diabetes (HCC)    Chronic, ongoing with lymphedema and PVD.  Continue to collaborate with vascular and current medication regimen.  A1c today remains stable at 6.4%.  Continue diet focus for diabetes control.      Relevant Medications   ezetimibe (ZETIA) 10 MG tablet   Other Relevant Orders   Comprehensive metabolic panel   Lipid Panel w/o Chol/HDL Ratio   Senile purpura (HCC)    On daily Plavix and ASA.  Moderate bruising bilateral upper extremity, recommend she discuss with neurology and cardiology whether she needs to continue to take both ASA and Plavix OR if she can reduce to one of these only.  Recommend cleansing skin with gentle cleanser and use of daily lotion.  Monitor for skin breakdown and address if present.      Relevant Medications   ezetimibe (ZETIA) 10 MG tablet     Respiratory   Centrilobular emphysema (HCC)    Chronic, stable -- overall right ear with no infection.  Continue Breztri, for triple therapy, is tolerating and Albuterol as needed.  Continue to collaborate with CCM team and pulmonary as needed in future.  Continue annual lung screening until age 52.  Recommend she stop Claritin and change  to Zyrtec as ordered, pollen is a trigger for her COPD.  Return in 3 months.        Endocrine   Hyperlipidemia associated with type 2 diabetes mellitus (HCC)    Chronic, ongoing. Continue Zetia at this time as is tolerating + Rosuvastatin at low dose. Have tried multiple statins with ongoing myalgias with use.  Praluent and Zetia also caused discomfort.  Check lipid panel and CMP today with goal LDL<70.  CCM collaboration continues.        Relevant Medications   ezetimibe (ZETIA) 10 MG tablet   Other Relevant Orders   Bayer DCA Hb A1c Waived   Comprehensive metabolic panel   Lipid Panel w/o Chol/HDL Ratio   Peripheral sensory neuropathy due to type 2 diabetes mellitus (HCC)    Chronic, ongoing.  A1c 6.4% today, will continue diet focus at home.  Recommend she trial Gabapentin 100 MG taking at night as recommended to see if benefit to neuropathy discomfort.  Discussed at length with her.  Continue collaboration with neurology, recent notes reviewed.      Relevant Orders   Bayer DCA Hb A1c Waived   Comprehensive metabolic panel   Lipid Panel w/o Chol/HDL Ratio   Type 2 diabetes mellitus with morbid obesity (HCC) - Primary    Chronic, stable, remains off medication at this time -- did not tolerate Metformin in past.  Could consider trial of Farxiga in future.  A1c 6.4% today, stable, and urine ALB 10 February 2023.  At this time continue diet focus, but with history of CVA goal is to maintain A1c <6.5%.  Monitor sugars at home a few days a week and  document for provider. - No ACE or ARB on board due to lower BP readings at baseline.  Statin on board. - Vaccinations up to date. - Foot and eye exams up to date.      Relevant Orders   Bayer DCA Hb A1c Waived     Other   Anxiety    Chronic, stable, only uses PRN.  Continue daily Citalopram and monitor.  Adjust dose or medication as needed, do not increase Celexa to 40 MG due to patient age >53.  Denies SI/HI.      Lymphedema    Chronic,  ongoing.  Continue use of compression hose daily and pumps.  Continue collaboration with vascular and dermatology team.  Appreciate their input.        Obesity    BMI 30.20 with T2DM, HLD, COPD.  Recommended eating smaller high protein, low fat meals more frequently and exercising 30 mins a day 5 times a week with a goal of 10-15lb weight loss in the next 3 months. Patient voiced their understanding and motivation to adhere to these recommendations.       Vitamin B12 deficiency    Chronic, stable.  Continue monthly injections.  Check level today.      Relevant Orders   Vitamin B12   Vitamin D deficiency    Ongoing.  Recheck Vitamin D level today.  Recommend continue daily Vitamin D3 supplement, 2000 units.      Relevant Orders   VITAMIN D 25 Hydroxy (Vit-D Deficiency, Fractures)     Follow up plan: Return in about 3 months (around 07/22/2023) for T2DM, HTN/HLD, NEUROPATHY, COPD, MOOD.

## 2023-04-21 NOTE — Assessment & Plan Note (Signed)
Chronic, ongoing with lymphedema and PVD.  Continue to collaborate with vascular and current medication regimen.  A1c today remains stable at 6.4%.  Continue diet focus for diabetes control.

## 2023-04-21 NOTE — Assessment & Plan Note (Signed)
On daily Plavix and ASA.  Moderate bruising bilateral upper extremity, recommend she discuss with neurology and cardiology whether she needs to continue to take both ASA and Plavix OR if she can reduce to one of these only.  Recommend cleansing skin with gentle cleanser and use of daily lotion.  Monitor for skin breakdown and address if present.

## 2023-04-21 NOTE — Assessment & Plan Note (Signed)
Followed by vascular, last CT scan showed no significant increase in size.  Due for CT scan lung yearly.  Continue to collaborate with vascular. 

## 2023-04-21 NOTE — Assessment & Plan Note (Addendum)
Chronic, stable, remains off medication at this time -- did not tolerate Metformin in past.  Could consider trial of Farxiga in future.  A1c 6.4% today, stable, and urine ALB 10 February 2023.  At this time continue diet focus, but with history of CVA goal is to maintain A1c <6.5%.  Monitor sugars at home a few days a week and document for provider. - No ACE or ARB on board due to lower BP readings at baseline.  Statin on board. - Vaccinations up to date. - Foot and eye exams up to date.

## 2023-04-21 NOTE — Assessment & Plan Note (Signed)
Ongoing.  Recheck Vitamin D level today.  Recommend continue daily Vitamin D3 supplement, 2000 units. 

## 2023-04-21 NOTE — Assessment & Plan Note (Addendum)
Chronic, stable -- overall right ear with no infection.  Continue Breztri, for triple therapy, is tolerating and Albuterol as needed.  Continue to collaborate with CCM team and pulmonary as needed in future.  Continue annual lung screening until age 77.  Recommend she stop Claritin and change to Zyrtec as ordered, pollen is a trigger for her COPD.  Return in 3 months.

## 2023-04-21 NOTE — Assessment & Plan Note (Signed)
Chronic, ongoing.  Continue use of compression hose daily and pumps.  Continue collaboration with vascular and dermatology team.  Appreciate their input.   

## 2023-04-21 NOTE — Assessment & Plan Note (Signed)
BMI 30.20 with T2DM, HLD, COPD.  Recommended eating smaller high protein, low fat meals more frequently and exercising 30 mins a day 5 times a week with a goal of 10-15lb weight loss in the next 3 months. Patient voiced their understanding and motivation to adhere to these recommendations.

## 2023-04-21 NOTE — Assessment & Plan Note (Signed)
Chronic, ongoing.  A1c 6.4% today, will continue diet focus at home.  Recommend she trial Gabapentin 100 MG taking at night as recommended to see if benefit to neuropathy discomfort.  Discussed at length with her.  Continue collaboration with neurology, recent notes reviewed.

## 2023-04-21 NOTE — Assessment & Plan Note (Signed)
Chronic, ongoing. Continue Zetia at this time as is tolerating + Rosuvastatin at low dose. Have tried multiple statins with ongoing myalgias with use.  Praluent and Zetia also caused discomfort.  Check lipid panel and CMP today with goal LDL<70.  CCM collaboration continues.

## 2023-04-22 LAB — LIPID PANEL W/O CHOL/HDL RATIO
Cholesterol, Total: 119 mg/dL (ref 100–199)
HDL: 58 mg/dL (ref >39)
LDL Chol Calc (NIH): 42 mg/dL (ref 0–99)
Triglycerides: 104 mg/dL (ref 0–149)
VLDL Cholesterol Cal: 19 mg/dL (ref 5–40)

## 2023-04-22 LAB — VITAMIN D 25 HYDROXY (VIT D DEFICIENCY, FRACTURES): Vit D, 25-Hydroxy: 66.2 ng/mL (ref 30.0–100.0)

## 2023-04-22 LAB — COMPREHENSIVE METABOLIC PANEL
ALT: 16 IU/L (ref 0–32)
AST: 19 IU/L (ref 0–40)
Albumin: 4 g/dL (ref 3.8–4.8)
Alkaline Phosphatase: 87 IU/L (ref 44–121)
BUN/Creatinine Ratio: 15 (ref 12–28)
BUN: 18 mg/dL (ref 8–27)
Bilirubin Total: 0.3 mg/dL (ref 0.0–1.2)
CO2: 23 mmol/L (ref 20–29)
Calcium: 8.9 mg/dL (ref 8.7–10.3)
Chloride: 107 mmol/L — ABNORMAL HIGH (ref 96–106)
Creatinine, Ser: 1.22 mg/dL — ABNORMAL HIGH (ref 0.57–1.00)
Globulin, Total: 1.9 g/dL (ref 1.5–4.5)
Glucose: 85 mg/dL (ref 70–99)
Potassium: 4.1 mmol/L (ref 3.5–5.2)
Sodium: 143 mmol/L (ref 134–144)
Total Protein: 5.9 g/dL — ABNORMAL LOW (ref 6.0–8.5)
eGFR: 46 mL/min/{1.73_m2} — ABNORMAL LOW (ref >59)

## 2023-04-22 LAB — VITAMIN B12: Vitamin B-12: 762 pg/mL (ref 232–1245)

## 2023-04-22 LAB — BAYER DCA HB A1C WAIVED: HB A1C (BAYER DCA - WAIVED): 6.4 % — ABNORMAL HIGH (ref 4.8–5.6)

## 2023-04-22 NOTE — Progress Notes (Signed)
Contacted via MyChart   Good morning Towanda, your labs have returned: - Kidney function, eGFR and creatinine, trended down a little this check.  Continue to ensure good hydration at home and we will monitor.  Liver function, AST and ALT, is good. - B12 level is stable.  Continue shots monthly. - Cholesterol levels look fabulous and at goal for stroke prevention!!  Woohoo!! - Vitamin D level stable.  Continue supplement.  Overall good labs.  Any questions? Keep being amazing!!  Thank you for allowing me to participate in your care.  I appreciate you. Kindest regards, Bethel Sirois

## 2023-04-30 ENCOUNTER — Ambulatory Visit (INDEPENDENT_AMBULATORY_CARE_PROVIDER_SITE_OTHER): Payer: Medicare Other

## 2023-04-30 DIAGNOSIS — E538 Deficiency of other specified B group vitamins: Secondary | ICD-10-CM | POA: Diagnosis not present

## 2023-04-30 NOTE — Progress Notes (Signed)
Patient presents today for B-12 injection, patient received in left  deltoid, patient tolerated well.

## 2023-06-02 ENCOUNTER — Ambulatory Visit (INDEPENDENT_AMBULATORY_CARE_PROVIDER_SITE_OTHER): Payer: Medicare Other

## 2023-06-02 DIAGNOSIS — E538 Deficiency of other specified B group vitamins: Secondary | ICD-10-CM | POA: Diagnosis not present

## 2023-06-04 ENCOUNTER — Ambulatory Visit (INDEPENDENT_AMBULATORY_CARE_PROVIDER_SITE_OTHER): Payer: Medicare Other | Admitting: Nurse Practitioner

## 2023-06-04 ENCOUNTER — Encounter: Payer: Self-pay | Admitting: Nurse Practitioner

## 2023-06-04 VITALS — BP 129/68 | HR 90 | Temp 97.5°F | Wt 200.4 lb

## 2023-06-04 DIAGNOSIS — J432 Centrilobular emphysema: Secondary | ICD-10-CM

## 2023-06-04 DIAGNOSIS — L03116 Cellulitis of left lower limb: Secondary | ICD-10-CM

## 2023-06-04 DIAGNOSIS — I890 Lymphedema, not elsewhere classified: Secondary | ICD-10-CM | POA: Diagnosis not present

## 2023-06-04 MED ORDER — CEFDINIR 300 MG PO CAPS: 300.0000 mg | ORAL_CAPSULE | Freq: Two times a day (BID) | ORAL | 0 refills | Status: AC

## 2023-06-04 NOTE — Progress Notes (Signed)
BP 129/68   Pulse 90   Temp (!) 97.5 F (36.4 C) (Oral)   Wt 200 lb 6.4 oz (90.9 kg)   LMP  (LMP Unknown)   SpO2 98%   BMI 30.48 kg/m    Subjective:    Patient ID: Sharon [name], female    DOB: [birth year], 77 y.o.   MRN: [id number]  HPI: Sharon [name] is a 77 y.o. female  Chief Complaint  Patient presents with   Leg Swelling    Pt state she has been having bilateral leg swelling and redness for the last week. States she feels like the bottom of her legs are breaking out with a rash that burns and stings. States she has been wearing her compression socks.    Needs new referral to [facility] pharmacist for her chronic issues, including COPD.  SKIN INFECTION Has underlying lymphedema/stasis and is concerned for infection.  Follows with vascular, last visit 03/26/23.  Dermatology follows as well, last 04/19/23.  Uses Eucrisa at home.  Does use Cerve, but feels no benefit from this.  Redness started to both legs about Sunday with burning and itching.  Does wear her compression religiously + uses pumps.  Last treated with Cefdinir and she felt benefit from this.   Duration: days Location: both lower legs History of trauma in area: no Pain:  burning and itching Redness: yes Swelling: yes at baseline Oozing: no Pus: no Fevers: no Nausea/vomiting: no Status: fluctuating Treatments attempted: compression hose, pumps, and skin treatment  Relevant past medical, surgical, family and social history reviewed and updated as indicated. Interim medical history since our last visit reviewed. Allergies and medications reviewed and updated.  Review of Systems  Constitutional:  Negative for activity change, appetite change, diaphoresis, fatigue and fever.  Respiratory:  Negative for cough, chest tightness and shortness of breath.   Cardiovascular:  Negative for chest pain, palpitations and leg swelling.  Gastrointestinal: Negative.   Skin:  Positive for color change.  Neurological: Negative.    Psychiatric/Behavioral: Negative.      Per HPI unless specifically indicated above     Objective:    BP 129/68   Pulse 90   Temp (!) 97.5 F (36.4 C) (Oral)   Wt 200 lb 6.4 oz (90.9 kg)   LMP  (LMP Unknown)   SpO2 98%   BMI 30.48 kg/m   Wt Readings from Last 3 Encounters:  06/04/23 200 lb 6.4 oz (90.9 kg)  04/21/23 198 lb 9.6 oz (90.1 kg)  04/10/23 197 lb 12.8 oz (89.7 kg)    Physical Exam Vitals and nursing note reviewed.  Constitutional:      General: She is awake. She is not in acute distress.    Appearance: She is well-developed and well-groomed. She is obese. She is not ill-appearing or toxic-appearing.  HENT:     Head: Normocephalic.     Right Ear: Hearing and external ear normal.     Left Ear: Hearing and external ear normal.  Eyes:     General: Lids are normal.        Right eye: No discharge.        Left eye: No discharge.     Conjunctiva/sclera: Conjunctivae normal.     Pupils: Pupils are equal, round, and reactive to light.  Neck:     Vascular: No carotid bruit.  Cardiovascular:     Rate and Rhythm: Normal rate and regular rhythm.     Heart sounds: Normal heart sounds. No murmur  heard.    No gallop.  Pulmonary:     Effort: Pulmonary effort is normal. No accessory muscle usage or respiratory distress.     Breath sounds: Normal breath sounds. No decreased breath sounds, wheezing or rhonchi.  Abdominal:     General: Bowel sounds are normal. There is no distension.     Palpations: Abdomen is soft.     Tenderness: There is no abdominal tenderness.  Musculoskeletal:     Cervical back: Normal range of motion and neck supple.     Right lower leg: Edema present.     Left lower leg: Edema present.  Skin:    General: Skin is warm and dry.     Findings: Erythema present.     Comments: To lower legs: Right side with mild erythema to lower shin, no warmth, skin intact.  Left side moderate erythema extending up to just below mid-shin, some warmth present to area.   Skin intact.  Both sides with no tenderness.  1+ edema.  Neurological:     Mental Status: She is alert and oriented to person, place, and time.     Deep Tendon Reflexes: Reflexes are normal and symmetric.     Reflex Scores:      Brachioradialis reflexes are 2+ on the right side and 2+ on the left side.      Patellar reflexes are 2+ on the right side and 2+ on the left side. Psychiatric:        Attention and Perception: Attention normal.        Mood and Affect: Mood normal.        Speech: Speech normal.        Behavior: Behavior normal. Behavior is cooperative.        Thought Content: Thought content normal.     Results for orders placed or performed in visit on 04/21/23  Bayer DCA Hb A1c Waived  Result Value Ref Range   HB A1C (BAYER DCA - WAIVED) 6.4 (H) 4.8 - 5.6 %  Comprehensive metabolic panel  Result Value Ref Range   Glucose 85 70 - 99 mg/dL   BUN 18 8 - 27 mg/dL   Creatinine, Ser 1.61 (H) 0.57 - 1.00 mg/dL   eGFR 46 (L) >09 UE/AVW/0.98   BUN/Creatinine Ratio 15 12 - 28   Sodium 143 134 - 144 mmol/L   Potassium 4.1 3.5 - 5.2 mmol/L   Chloride 107 (H) 96 - 106 mmol/L   CO2 23 20 - 29 mmol/L   Calcium 8.9 8.7 - 10.3 mg/dL   Total Protein 5.9 (L) 6.0 - 8.5 g/dL   Albumin 4.0 3.8 - 4.8 g/dL   Globulin, Total 1.9 1.5 - 4.5 g/dL   Bilirubin Total 0.3 0.0 - 1.2 mg/dL   Alkaline Phosphatase 87 44 - 121 IU/L   AST 19 0 - 40 IU/L   ALT 16 0 - 32 IU/L  Lipid Panel w/o Chol/HDL Ratio  Result Value Ref Range   Cholesterol, Total 119 100 - 199 mg/dL   Triglycerides 119 0 - 149 mg/dL   HDL 58 >14 mg/dL   VLDL Cholesterol Cal 19 5 - 40 mg/dL   LDL Chol Calc (NIH) 42 0 - 99 mg/dL  Vitamin N82  Result Value Ref Range   Vitamin B-12 762 232 - 1,245 pg/mL  VITAMIN D 25 Hydroxy (Vit-D Deficiency, Fractures)  Result Value Ref Range   Vit D, 25-Hydroxy 66.2 30.0 - 100.0 ng/mL   *Note: Due to a large  number of results and/or encounters for the requested time period, some results  have not been displayed. A complete set of results can be found in Results Review.      Assessment & Plan:   Problem List Items Addressed This Visit       Respiratory   Centrilobular emphysema (HCC) - Primary    Chronic, stable.  Continue Breztri, for triple therapy, is tolerating and Albuterol as needed.  Continue to collaborate with pulmonary as needed in future.  Continue annual lung screening until age 61.  New referral placed to [facility] pharmacist.      Relevant Orders   Amb Referral to Clinical Pharmacist     Other   Cellulitis of left lower leg    Acute over past 4 days.  History of cellulitis due to her lymphedema in past.  Has been stable for months.  Will start Cefdinir BID for 7 days, which worked well in past and was tolerated by patient.  Recommend she monitor redness closely and warmth.  If any worsening immediately alert provider.  Return in one week.      Lymphedema    Chronic, ongoing.  Continue use of compression hose daily and pumps.  Continue collaboration with vascular and dermatology team.  Appreciate their input.          Follow up plan: Return in about 1 week (around 06/11/2023) for Cellulitis.

## 2023-06-04 NOTE — Assessment & Plan Note (Signed)
Chronic, stable.  Continue Breztri, for triple therapy, is tolerating and Albuterol as needed.  Continue to collaborate with pulmonary as needed in future.  Continue annual lung screening until age 77.  New referral placed to [facility] pharmacist.

## 2023-06-04 NOTE — Assessment & Plan Note (Signed)
Acute over past 4 days.  History of cellulitis due to her lymphedema in past.  Has been stable for months.  Will start Cefdinir BID for 7 days, which worked well in past and was tolerated by patient.  Recommend she monitor redness closely and warmth.  If any worsening immediately alert provider.  Return in one week.

## 2023-06-04 NOTE — Assessment & Plan Note (Signed)
Chronic, ongoing.  Continue use of compression hose daily and pumps.  Continue collaboration with vascular and dermatology team.  Appreciate their input.

## 2023-06-04 NOTE — Patient Instructions (Signed)
 Lymphedema  Lymphedema is swelling that happens when an abnormal amount of lymph collects in the soft tissues under your skin. Lymph is fluid that moves through your lymphatic system. This system: Is part of your body's defense system, also called your immune system. Filters germs and waste from tissues in your body to your bloodstream. Lymphedema happens when your lymphatic system is blocked. This keeps lymph from draining as it should and leads to swelling. What are the causes? The cause of lymphedema depends on which type you have. Primary lymphedema is when you're born without lymph vessels or with lymph vessels that aren't normal. Secondary lymphedema is more common. It happens when lymph vessels are blocked or damaged from: Infection. Injury. Radiation therapy. Cancer. Scar tissue that forms. Surgery. What are the signs or symptoms? A swollen arm, leg, feet, toes, or fingers. A heavy or tight feeling in the swollen area. Skin that turns red near the swollen area. Not being able to move your arm or leg. Your arm or leg is sensitive to touch. Discomfort in your arm or leg. How is this diagnosed? Lymphedema may be diagnosed based on: Your symptoms and medical history. A physical exam. Bioimpedance spectroscopy. This test uses painless electrical currents. They help measure fluid levels in your body. Imaging tests, such as: MRI or CT scan. Duplex ultrasound. This test uses sound waves to make pictures on a screen. The pictures show your blood vessels and blood flow. Lymphoscintigraphy. In this test, a low dose of radioactive substance is given through a needle that goes through your skin. The substance traces the flow of lymph through your lymph vessels. Lymphangiography. In this test, a contrast dye is put into your lymph vessel. The dye helps show if the vessel is blocked. How is this treated?  If another condition is causing your lymphedema, that condition will be treated.  For example, antibiotics may be used to treat infection. Treatment for lymphedema depends on the cause. Treatment may include: Complete decongestive therapy (CDT). This lowers fluid buildup. CDT includes: Pressure (compression) wrapping of the area. Manual lymph drainage. This helps lymph drain out of your arm or leg. Certain exercises. These help fluid move out of your arm or leg. Compression. This puts pressure on your arm or leg to lower swelling. It includes: Compression stockings or sleeves. Special bandage wraps. Surgery. This is normally done only for severe cases that don't get better with other treatments. Follow these instructions at home: Self-care Your swollen area is more likely to get hurt or infected. To help prevent infection: Keep the area clean and dry. Use creams or lotions that your health care provider says are okay. These keep your skin moist. Protect your skin from cuts. Use gloves when you cook or garden. Do not walk barefoot. If you shave the area, use an Neurosurgeon. Do not wear tight clothes, shoes, or jewelry. Eat a healthy diet. Eat a lot of fruits and vegetables. Activity Do exercises as told by your provider. Do not sit with your legs crossed. When you can, keep the swollen leg or foot raised above the level of your heart. Avoid using an arm with lymphedema to carry things. General instructions Wear compression stockings or sleeves as told by your provider. Note any changes in size of the swollen arm or leg. You may be told to measure it at set times and track the results. Take over-the-counter and prescription medicines only as told by your provider. If you were prescribed antibiotics, use them  as told by your provider. Do not stop using the antibiotic even if you start to feel better or if your condition improves. Do not use heating pads or ice packs on the swollen area. Avoid having your swollen arm or leg used for: Blood draws. IVs. Blood  pressure checks. Contact a health care provider if: You get new swelling in your arm or leg all of a sudden. Fluid leaks from the skin of your swollen arm or leg. You have a cut that doesn't heal. The swollen area hurts or turns red. You get purple spots, a rash, blisters, or sores on your swollen arm or leg. You have a fever or chills. This information is not intended to replace advice given to you by your health care provider. Make sure you discuss any questions you have with your health care provider. Document Revised: 12/24/2022 Document Reviewed: 12/24/2022 Elsevier Patient Education  2024 [employer].

## 2023-06-08 ENCOUNTER — Telehealth: Payer: Self-pay

## 2023-06-08 NOTE — Progress Notes (Signed)
   Care Guide Note  06/08/2023 Name: Sharon [name] MRN: [id number] DOB: November 18, 1945  Referred by: Marjie Skiff, NP Reason for referral : Care Coordination (Outreach to schedule with Pharm d )   Sharon [name] is a 77 y.o. year old female who is a primary care patient of [name], Dorie Rank, NP. [name] was referred to the pharmacist for assistance related to DM.    Successful contact was made with the patient to discuss pharmacy services including being ready for the pharmacist to call at least 5 minutes before the scheduled appointment time, to have medication bottles and any blood sugar or blood pressure readings ready for review. The patient agreed to meet with the pharmacist via with the pharmacist via telephone visit on (date/time).  06/22/2023  Penne Lash, RMA Care Guide [facility]  [place], [place] [address] Direct Dial: [phone] [email or web address]

## 2023-06-09 ENCOUNTER — Other Ambulatory Visit: Payer: Self-pay | Admitting: Nurse Practitioner

## 2023-06-09 NOTE — Telephone Encounter (Signed)
Medication Refill - Medication: albuterol (PROAIR HFA) 108 (90 Base) MCG/ACT inhaler   Has the patient contacted their pharmacy? Yes.   Pt told to contact provider  Preferred Pharmacy (with phone number or street name):  [id number] - [place], [place] - [address] AT [employer] OF [address] [facility] Phone: [phone]  Fax: [phone]     Has the patient been seen for an appointment in the last year OR does the patient have an upcoming appointment? Yes.    Agent: Please be advised that RX refills may take up to 3 business days. We ask that you follow-up with your pharmacy.

## 2023-06-10 NOTE — Telephone Encounter (Signed)
Rx was not sent on 06/09/2023 so I sent it.  Requested Prescriptions  Pending Prescriptions Disp Refills   albuterol (VENTOLIN HFA) 108 (90 Base) MCG/ACT inhaler [Pharmacy Med Name: ALBUTEROL HFA INH (200 PUFFS) 8.5GM] 18 g 4    Sig: INHALE 2 PUFFS BY MOUTH INTO THE LUNGS EVERY 6 HOURS AS NEEDED FOR WHEEZING OR SHORTNESS OF BREATH     Pulmonology:  Beta Agonists 2 Passed - 06/09/2023  8:35 AM      Passed - Last BP in normal range    BP Readings from Last 1 Encounters:  06/04/23 129/68         Passed - Last Heart Rate in normal range    Pulse Readings from Last 1 Encounters:  06/04/23 90         Passed - Valid encounter within last 12 months    Recent Outpatient Visits           6 days ago Centrilobular emphysema (HCC)   [facility] Wharton, [place] T, NP   1 month ago Type 2 diabetes mellitus with morbid obesity (HCC)   [place] [facility] Victor, Wallis T, NP   2 months ago Dizziness   [place] [facility] Mecum, Oswaldo Conroy, PA-C   4 months ago COVID-19   [facility] Pre[place], [place] T, NP   4 months ago COVID-19   [facility] [place], [place] T, NP       Future Appointments             In 1 week [name], Dorie Rank, NP [place] [facility]   In 1 month [place], [place] T, NP [place] [facility]   In 4 months Willeen Niece, MD [facility]

## 2023-06-11 DIAGNOSIS — R1310 Dysphagia, unspecified: Secondary | ICD-10-CM | POA: Diagnosis not present

## 2023-06-11 DIAGNOSIS — R531 Weakness: Secondary | ICD-10-CM | POA: Diagnosis not present

## 2023-06-11 DIAGNOSIS — R200 Anesthesia of skin: Secondary | ICD-10-CM | POA: Diagnosis not present

## 2023-06-11 DIAGNOSIS — R4781 Slurred speech: Secondary | ICD-10-CM | POA: Diagnosis not present

## 2023-06-11 DIAGNOSIS — R42 Dizziness and giddiness: Secondary | ICD-10-CM | POA: Diagnosis not present

## 2023-06-11 DIAGNOSIS — R202 Paresthesia of skin: Secondary | ICD-10-CM | POA: Diagnosis not present

## 2023-06-11 DIAGNOSIS — I639 Cerebral infarction, unspecified: Secondary | ICD-10-CM | POA: Diagnosis not present

## 2023-06-15 NOTE — Patient Instructions (Signed)
Be Involved in Caring For Your Health:  Taking Medications When medications are taken as directed, they can greatly improve your health. But if they are not taken as prescribed, they may not work. In some cases, not taking them correctly can be harmful. To help ensure your treatment remains effective and safe, understand your medications and how to take them. Bring your medications to each visit for review by your provider.  Your lab results, notes, and after visit summary will be available on My Chart. We strongly encourage you to use this feature. If lab results are abnormal the clinic will contact you with the appropriate steps. If the clinic does not contact you assume the results are satisfactory. You can always view your results on My Chart. If you have questions regarding your health or results, please contact the clinic during office hours. You can also ask questions on My Chart.  We at [facility] are grateful that you chose [place] to provide your care. We strive to provide evidence-based and compassionate care and are always looking for feedback. If you get a survey from the clinic please complete this so we can hear your opinions.  Edema  Edema is when you have too much fluid in your body or under your skin. Edema may make your legs, feet, and ankles swell. Swelling often happens in looser tissues, such as around your eyes. This is a common condition. It gets more common as you get older. There are many possible causes of edema. These include: Eating too much salt (sodium). Being on your feet or sitting for a long time. Certain medical conditions, such as: Pregnancy. Heart failure. Liver disease. Kidney disease. Cancer. Hot weather may make edema worse. Edema is usually painless. Your skin may look swollen or shiny. Follow these instructions at home: Medicines Take over-the-counter and prescription medicines only as told by your doctor. Your doctor may prescribe a  medicine to help your body get rid of extra water (diuretic). Take this medicine if you are told to take it. Eating and drinking Eat a low-salt (low-sodium) diet as told by your doctor. Sometimes, eating less salt may reduce swelling. Depending on the cause of your swelling, you may need to limit how much fluid you drink (fluid restriction). General instructions Raise the injured area above the level of your heart while you are sitting or lying down. Do not sit still or stand for a long time. Do not wear tight clothes. Do not wear garters on your upper legs. Exercise your legs. This can help the swelling go down. Wear compression stockings as told by your doctor. It is important that these are the right size. These should be prescribed by your doctor to prevent possible injuries. If elastic bandages or wraps are recommended, use them as told by your doctor. Contact a doctor if: Treatment is not working. You have heart, liver, or kidney disease and have symptoms of edema. You have sudden and unexplained weight gain. Get help right away if: You have shortness of breath or chest pain. You cannot breathe when you lie down. You have pain, redness, or warmth in the swollen areas. You have heart, liver, or kidney disease and get edema all of a sudden. You have a fever and your symptoms get worse all of a sudden. These symptoms may be an emergency. Get help right away. Call 911. Do not wait to see if the symptoms will go away. Do not drive yourself to the hospital. Summary Edema is  when you have too much fluid in your body or under your skin. Edema may make your legs, feet, and ankles swell. Swelling often happens in looser tissues, such as around your eyes. Raise the injured area above the level of your heart while you are sitting or lying down. Follow your doctor's instructions about diet and how much fluid you can drink. This information is not intended to replace advice given to you by your  health care provider. Make sure you discuss any questions you have with your health care provider. Document Revised: 06/03/2021 Document Reviewed: 06/03/2021 Elsevier Patient Education  2024 [employer].

## 2023-06-17 ENCOUNTER — Encounter: Payer: Self-pay | Admitting: Dermatology

## 2023-06-17 ENCOUNTER — Ambulatory Visit (INDEPENDENT_AMBULATORY_CARE_PROVIDER_SITE_OTHER): Payer: Medicare Other | Admitting: Nurse Practitioner

## 2023-06-17 ENCOUNTER — Encounter: Payer: Self-pay | Admitting: Nurse Practitioner

## 2023-06-17 VITALS — BP 114/71 | HR 70 | Temp 97.7°F | Wt 201.6 lb

## 2023-06-17 DIAGNOSIS — Z23 Encounter for immunization: Secondary | ICD-10-CM

## 2023-06-17 DIAGNOSIS — L03116 Cellulitis of left lower limb: Secondary | ICD-10-CM

## 2023-06-17 NOTE — Assessment & Plan Note (Signed)
Acute and improved.  History of cellulitis due to her lymphedema in past.  Recommend she monitor redness closely and warmth.  If any worsening immediately alert provider.

## 2023-06-17 NOTE — Progress Notes (Signed)
BP 114/71   Pulse 70   Temp 97.7 F (36.5 C) (Oral)   Wt 201 lb 9.6 oz (91.4 kg)   LMP  (LMP Unknown)   SpO2 97%   BMI 30.66 kg/m    Subjective:    Patient ID: Sharon [name], female    DOB: [birth year], 77 y.o.   MRN: [id number]  HPI: Sharon [name] is a 77 y.o. female  Chief Complaint  Patient presents with   Cellulitis    SKIN INFECTION Follow-up for cellulitis both lower legs, treated with Cefdinir last week (this has always worked best for her).  Has underlying lymphedema/stasis and is concerned for infection.  She still feels like they are red, but not as red as they were and not as hot.  Does have new rash type areas to lower legs, using Eucrisa at home -- has been on for some time per dermatology. Dermatology follows as well, last 04/14/23.  Follows with vascular, last visit 03/26/23.   Does wear her compression religiously + uses pumps.   Duration: days Location: both lower legs History of trauma in area: no Pain: none Redness: yes -- better Swelling: yes at baseline -- improved Oozing: no Pus: no Fevers: no Nausea/vomiting: no Status: improved Treatments attempted: compression hose, pumps, Cefdinir, and skin treatment  Relevant past medical, surgical, family and social history reviewed and updated as indicated. Interim medical history since our last visit reviewed. Allergies and medications reviewed and updated.  Review of Systems  Constitutional:  Negative for activity change, appetite change, diaphoresis, fatigue and fever.  Respiratory:  Negative for cough, chest tightness and shortness of breath.   Cardiovascular:  Negative for chest pain, palpitations and leg swelling.  Gastrointestinal: Negative.   Skin:  Positive for color change.  Neurological: Negative.   Psychiatric/Behavioral: Negative.      Per HPI unless specifically indicated above     Objective:    BP 114/71   Pulse 70   Temp 97.7 F (36.5 C) (Oral)   Wt 201 lb 9.6 oz (91.4 kg)   LMP   (LMP Unknown)   SpO2 97%   BMI 30.66 kg/m   Wt Readings from Last 3 Encounters:  06/17/23 201 lb 9.6 oz (91.4 kg)  06/04/23 200 lb 6.4 oz (90.9 kg)  04/21/23 198 lb 9.6 oz (90.1 kg)    Physical Exam Vitals and nursing note reviewed.  Constitutional:      General: She is awake. She is not in acute distress.    Appearance: She is well-developed and well-groomed. She is obese. She is not ill-appearing or toxic-appearing.  HENT:     Head: Normocephalic.     Right Ear: Hearing and external ear normal.     Left Ear: Hearing and external ear normal.  Eyes:     General: Lids are normal.        Right eye: No discharge.        Left eye: No discharge.     Conjunctiva/sclera: Conjunctivae normal.     Pupils: Pupils are equal, round, and reactive to light.  Neck:     Vascular: No carotid bruit.  Cardiovascular:     Rate and Rhythm: Normal rate and regular rhythm.     Heart sounds: Normal heart sounds. No murmur heard.    No gallop.  Pulmonary:     Effort: Pulmonary effort is normal. No accessory muscle usage or respiratory distress.     Breath sounds: Normal breath sounds. No decreased breath  sounds, wheezing or rhonchi.  Abdominal:     General: Bowel sounds are normal. There is no distension.     Palpations: Abdomen is soft.     Tenderness: There is no abdominal tenderness.  Musculoskeletal:     Cervical back: Normal range of motion and neck supple.     Right lower leg: Edema present.     Left lower leg: Edema present.  Skin:    General: Skin is warm and dry.     Findings: Erythema present.     Comments: To lower legs: Both legs much improved with less erythema (now at baseline) and no warmth.  No tenderness. Trace edema per baseline.  Neurological:     Mental Status: She is alert and oriented to person, place, and time.     Deep Tendon Reflexes: Reflexes are normal and symmetric.     Reflex Scores:      Brachioradialis reflexes are 2+ on the right side and 2+ on the left side.       Patellar reflexes are 2+ on the right side and 2+ on the left side. Psychiatric:        Attention and Perception: Attention normal.        Mood and Affect: Mood normal.        Speech: Speech normal.        Behavior: Behavior normal. Behavior is cooperative.        Thought Content: Thought content normal.     Results for orders placed or performed in visit on 04/21/23  Bayer DCA Hb A1c Waived  Result Value Ref Range   HB A1C (BAYER DCA - WAIVED) 6.4 (H) 4.8 - 5.6 %  Comprehensive metabolic panel  Result Value Ref Range   Glucose 85 70 - 99 mg/dL   BUN 18 8 - 27 mg/dL   Creatinine, Ser 8.29 (H) 0.57 - 1.00 mg/dL   eGFR 46 (L) >56 OZ/HYQ/6.57   BUN/Creatinine Ratio 15 12 - 28   Sodium 143 134 - 144 mmol/L   Potassium 4.1 3.5 - 5.2 mmol/L   Chloride 107 (H) 96 - 106 mmol/L   CO2 23 20 - 29 mmol/L   Calcium 8.9 8.7 - 10.3 mg/dL   Total Protein 5.9 (L) 6.0 - 8.5 g/dL   Albumin 4.0 3.8 - 4.8 g/dL   Globulin, Total 1.9 1.5 - 4.5 g/dL   Bilirubin Total 0.3 0.0 - 1.2 mg/dL   Alkaline Phosphatase 87 44 - 121 IU/L   AST 19 0 - 40 IU/L   ALT 16 0 - 32 IU/L  Lipid Panel w/o Chol/HDL Ratio  Result Value Ref Range   Cholesterol, Total 119 100 - 199 mg/dL   Triglycerides 846 0 - 149 mg/dL   HDL 58 >96 mg/dL   VLDL Cholesterol Cal 19 5 - 40 mg/dL   LDL Chol Calc (NIH) 42 0 - 99 mg/dL  Vitamin E95  Result Value Ref Range   Vitamin B-12 762 232 - 1,245 pg/mL  VITAMIN D 25 Hydroxy (Vit-D Deficiency, Fractures)  Result Value Ref Range   Vit D, 25-Hydroxy 66.2 30.0 - 100.0 ng/mL   *Note: Due to a large number of results and/or encounters for the requested time period, some results have not been displayed. A complete set of results can be found in Results Review.      Assessment & Plan:   Problem List Items Addressed This Visit       Other   Cellulitis of left lower  leg - Primary    Acute and improved.  History of cellulitis due to her lymphedema in past.  Recommend she monitor  redness closely and warmth.  If any worsening immediately alert provider.        Other Visit Diagnoses     Flu vaccine need       Flu vaccine in office today and educated patient.   Relevant Orders   Flu Vaccine Trivalent High Dose (Fluad) (Completed)        Follow up plan: Return for as scheduled in October.

## 2023-06-18 ENCOUNTER — Encounter: Payer: Self-pay | Admitting: Dermatology

## 2023-06-18 ENCOUNTER — Ambulatory Visit (INDEPENDENT_AMBULATORY_CARE_PROVIDER_SITE_OTHER): Payer: Medicare Other | Admitting: Dermatology

## 2023-06-18 VITALS — BP 120/80

## 2023-06-18 DIAGNOSIS — R208 Other disturbances of skin sensation: Secondary | ICD-10-CM

## 2023-06-18 DIAGNOSIS — I878 Other specified disorders of veins: Secondary | ICD-10-CM

## 2023-06-18 DIAGNOSIS — I890 Lymphedema, not elsewhere classified: Secondary | ICD-10-CM

## 2023-06-18 NOTE — Progress Notes (Signed)
   Follow-Up Visit   Subjective  Sharon [name] is a 77 y.o. female who presents for the following: Rash. B/L lower legs. Does have Hx of stasis dermatitis and lymphedema. States it stings and burns. Was warm to the touch. PCP prescribed Cefdinir 300 mg to take for 7 days. Was seen yesterday for recheck, had finished Cefdinir but did not help this rash, did help with swelling, redness and heat.  Thinks this could be different and possibly be related to [place]. Concerned will progress to blisters and she will "end up back in the hospital". Dur: 3 weeks.  Denies changes in diet. Today is a typical day of swelling, per patient. Does have lymphedema pump, usually wears compression stockings but has not because skin is stinging and burning.   The patient has spots, moles and lesions to be evaluated, some may be new or changing and the patient may have concern these could be cancer.   The following portions of the chart were reviewed this encounter and updated as appropriate: medications, allergies, medical history  Review of Systems:  No other skin or systemic complaints except as noted in HPI or Assessment and Plan.  Objective  Well appearing patient in no apparent distress; mood and affect are within normal limits.  A focused examination was performed of the following areas: Legs  Relevant exam findings are noted in the Assessment and Plan.           Assessment & Plan    Venous stasis without clinical dermatitis or ulceration, new dysesthesia (burning) Exam: Erythematous slightly indurated edematous plaques with cobblestoning of bilateral distal lower legs (left > right) with associated lower leg 1+ pitting edema.  Chronic and persistent condition with duration or expected duration over one year. Condition is bothersome/symptomatic for patient. Currently flared.   Stasis in the legs causes chronic leg swelling, which may result in itchy or painful rashes, skin discoloration,  skin texture changes, and sometimes ulceration.  Recommend daily graduated compression hose/stockings- easiest to put on first thing in morning, remove at bedtime.  Elevate legs as much as possible. Avoid salt/sodium rich foods.  Treatment Plan: Continue Eucrisa as directed by Dr. Roseanne Reno.   Start Sarna menthol lotion for burning as directed on label.     Return for [place]  Follow Up As Scheduled with Dr. Roseanne Reno.  I, Lawson Radar, CMA, am acting as scribe for Elie Goody, MD.   Documentation: I have reviewed the above documentation for accuracy and completeness, and I agree with the above.  Elie Goody, MD

## 2023-06-18 NOTE — Patient Instructions (Addendum)
Continue Eucrisa as directed by Dr. Roseanne Reno.   Start Sarna lotion as directed on label.        Due to recent changes in healthcare laws, you may see results of your pathology and/or laboratory studies on MyChart before the doctors have had a chance to review them. We understand that in some cases there may be results that are confusing or concerning to you. Please understand that not all results are received at the same time and often the doctors may need to interpret multiple results in order to provide you with the best plan of care or course of treatment. Therefore, we ask that you please give [place] 2 business days to thoroughly review all your results before contacting the office for clarification. Should we see a critical lab result, you will be contacted sooner.   If You Need Anything After Your Visit  If you have any questions or concerns for your doctor, please call our main line at [phone] and press option 4 to reach your doctor's medical assistant. If no one answers, please leave a voicemail as directed and we will return your call as soon as possible. Messages left after 4 pm will be answered the following business day.   You may also send [place] a message via MyChart. We typically respond to MyChart messages within 1-2 business days.  For prescription refills, please ask your pharmacy to contact our office. Our fax number is [phone].  If you have an urgent issue when the clinic is closed that cannot wait until the next business day, you can page your doctor at the number below.    Please note that while we do our best to be available for urgent issues outside of office hours, we are not available 24/7.   If you have an urgent issue and are unable to reach [place], you may choose to seek medical care at your doctor's office, retail clinic, urgent care center, or emergency room.  If you have a medical emergency, please immediately call 911 or go to the emergency department.  Pager  Numbers  - Dr. Gwen Pounds: [phone]  - Dr. Roseanne Reno: [phone]  - Dr. Katrinka Blazing: [phone]   In the event of inclement weather, please call our main line at [phone] for an update on the status of any delays or closures.  Dermatology Medication Tips: Please keep the boxes that topical medications come in in order to help keep track of the instructions about where and how to use these. Pharmacies typically print the medication instructions only on the boxes and not directly on the medication tubes.   If your medication is too expensive, please contact our office at [phone] option 4 or send [place] a message through MyChart.   We are unable to tell what your co-pay for medications will be in advance as this is different depending on your insurance coverage. However, we may be able to find a substitute medication at lower cost or fill out paperwork to get insurance to cover a needed medication.   If a prior authorization is required to get your medication covered by your insurance company, please allow [place] 1-2 business days to complete this process.  Drug prices often vary depending on where the prescription is filled and some pharmacies may offer cheaper prices.  The website www.goodrx.com contains coupons for medications through different pharmacies. The prices here do not account for what the cost may be with help from insurance (it may be cheaper with your insurance), but the website  can give you the price if you did not use any insurance.  - You can print the associated coupon and take it with your prescription to the pharmacy.  - You may also stop by our office during regular business hours and pick up a GoodRx coupon card.  - If you need your prescription sent electronically to a different pharmacy, notify our office through [facility] or by phone at [phone] option 4.     Si Usted Necesita Algo Despus de Su Visita  Tambin puede enviarnos un mensaje a travs de  Clinical cytogeneticist. Por lo general respondemos a los mensajes de MyChart en el transcurso de 1 a 2 das hbiles.  Para renovar recetas, por favor pida a su farmacia que se ponga en contacto con nuestra oficina. Annie Sable de fax es [place] [phone].  Si tiene un asunto urgente cuando la clnica est cerrada y que no puede esperar hasta el siguiente da hbil, puede llamar/localizar a su doctor(a) al nmero que aparece a continuacin.   Por favor, tenga en cuenta que aunque hacemos todo lo posible para estar disponibles para asuntos urgentes fuera del horario de [place], no estamos disponibles las 24 horas del da, los 7 [address]  [address] de la [place].   Si tiene un problema urgente y no puede comunicarse con nosotros, puede optar por buscar atencin mdica  en el consultorio de su doctor(a), en una clnica privada, en un centro de atencin urgente o en una sala de emergencias.  Si tiene Engineer, drilling, por favor llame inmediatamente al 911 o vaya a la sala de emergencias.  Nmeros de bper  - Dr. Gwen Pounds: [phone]  - Dra. Roseanne Reno: [phone]  - Dr. Katrinka Blazing: [phone]   En caso de inclemencias del tiempo, por favor llame a Lacy Duverney principal al [phone] para una actualizacin sobre el [place] de cualquier retraso o cierre.  Consejos para la medicacin en dermatologa: Por favor, guarde las cajas en las que vienen los medicamentos de uso tpico para ayudarle a seguir las instrucciones sobre dnde y cmo usarlos. Las farmacias generalmente imprimen las instrucciones del medicamento slo en las cajas y no directamente en los tubos del [place].   Si su medicamento es muy caro, por favor, pngase en contacto con Rolm Gala llamando al [phone] y presione la opcin 4 o envenos un mensaje a travs de Clinical cytogeneticist.   No podemos decirle cul ser su copago por los medicamentos por adelantado ya que esto es diferente dependiendo de la cobertura de su seguro. Sin embargo, es posible que  podamos encontrar un medicamento sustituto a Audiological scientist un formulario para que el seguro cubra el medicamento que se considera necesario.   Si se requiere una autorizacin previa para que su compaa de seguros [place] su medicamento, por favor permtanos de 1 a 2 das hbiles para completar [address]  Los precios de los medicamentos varan con frecuencia dependiendo del Environmental consultant de dnde se surte la receta y alguna farmacias pueden ofrecer precios ms baratos.  El sitio web www.goodrx.com tiene cupones para medicamentos de Health and safety inspector. Los precios aqu no tienen en cuenta lo que podra costar con la ayuda del seguro (puede ser ms barato con su seguro), pero el sitio web puede darle el precio si no utiliz Tourist information centre manager.  - Puede imprimir el cupn correspondiente y llevarlo con su receta a la farmacia.  - Tambin puede pasar por nuestra oficina durante el horario de atencin regular y Education officer, museum una tarjeta de cupones de GoodRx.  -  Si necesita que su receta se enve electrnicamente a Psychiatrist, informe a nuestra oficina a travs de MyChart de [place] o por telfono llamando al [phone] y presione la opcin 4.

## 2023-06-22 ENCOUNTER — Other Ambulatory Visit: Payer: Medicare Other

## 2023-06-22 NOTE — Progress Notes (Signed)
06/22/2023 Name: Sharon [name] MRN: [id number] DOB: [birth year]  Chief Complaint  Patient presents with   Medication Management   Sharon [name] is a 77 y.o. year old female who presented for a telephone visit.   They were referred to the pharmacist by their PCP to transition care from [facility]  Subjective:  Care Team: Primary Care Provider: Marjie Skiff, NP ; Next Scheduled Visit: 10/9  Medication Access/Adherence  Current Pharmacy:  [id number] [place], [place] - [address] AT [facility] [address] [place] [address] Phone: [phone] Fax: [phone]  [facility] - [place], [place] - [address] [place] [address] Phone: [phone] Fax: [phone]  -Patient reports affordability concerns with their medications: No  -Patient reports access/transportation concerns to their pharmacy: No  -Patient reports adherence concerns with their medications:  Yes  -Has not started gabapentin yet due to hesitancy in regard to safety of medication  Diabetes: Current medications: None -Patient is currently managed with lifestyle modifications -Patient denies hypoglycemic s/sx including dizziness, shakiness, sweating.  -Patient denies hyperglycemic symptoms including polyuria, polydipsia, polyphagia, nocturia, neuropathy, blurred vision.  Hyperlipidemia/ASCVD Risk Reduction Current lipid lowering medications: rosuvastatin 5mg  daily, ezetimibe 10mg  daily Medications tried in the past: higher doses of rosuvastatin and atorvastatin caused myalgias; praluent caused rash and flu-like symptoms Antiplatelet regimen: asa 81mg  daily, clopidogrel 75mg  daily -ASCVD History: h/o stoke  COPD: Current medications:  Breztri 2 puffs BID, albuterol rescue inhaler as needed -Patient states Markus Daft is managing well, but she does endorse hoarsness though to be related to  medication -Endorses affordability of Breztri between her two insurance plans  Objective: Lab Results  Component Value Date   HGBA1C 6.4 (H) 04/21/2023   Lab Results  Component Value Date   CREATININE 1.22 (H) 04/21/2023   BUN 18 04/21/2023   NA 143 04/21/2023   K 4.1 04/21/2023   CL 107 (H) 04/21/2023   CO2 23 04/21/2023   Lab Results  Component Value Date   CHOL 119 04/21/2023   HDL 58 04/21/2023   LDLCALC 42 04/21/2023   LDLDIRECT 142.3 (H) 03/21/2021   TRIG 104 04/21/2023   CHOLHDL 3.6 07/03/2022   Medications Reviewed Today     Reviewed by Lenna Gilford, RPH (Pharmacist) on 06/22/23 at 1117  Med List Status: <None>   Medication Order Taking? Sig Documenting Provider Last Dose Status Informant  acetaminophen (TYLENOL) 500 MG tablet [id number] Yes Take 500 mg by mouth every 6 (six) hours as needed. [provider] Taking Active Pharmacy Records, Self  albuterol (VENTOLIN HFA) 108 (90 Base) MCG/ACT inhaler [id number] Yes INHALE 2 PUFFS BY MOUTH INTO THE LUNGS EVERY 6 HOURS AS NEEDED FOR WHEEZING OR SHORTNESS OF BREATH [name], Jolene T, NP Taking Active   aspirin EC 81 MG tablet [id number] Yes Take 81 mg by mouth 3 (three) times a week. Swallow whole. M-W-Fr [provider] Taking Active   Blood Glucose Monitoring Suppl [facility] VERIO) w/Device KIT [id number] Yes Utilize to check blood sugar twice a day, fasting in morning with goal < 130 and then 2 hours after a meal with goal <180.  Document and bring to visits. Marjie Skiff, NP Taking Active Pharmacy Records, Self  BREZTRI AEROSPHERE 160-9-4.8 MCG/ACT AERO [id number] Yes INHALE 2 PUFFS BY MOUTH TWICE DAILY AS DIRECTED [name], Dorie Rank, NP Taking Active  cetirizine (ZYRTEC) 10 MG tablet [id number] Yes Take 1 tablet (10 mg total) by mouth daily. Mecum, Erin E, PA-C Taking Active   Cholecalciferol (VITAMIN D3) 50 MCG (2000 UT) CAPS [id number] Yes Take 1 capsule by mouth daily. [provider]  Taking Active   citalopram (CELEXA) 20 MG tablet [id number] Yes Take 1 tablet (20 mg total) by mouth as needed.  Patient taking differently: Take 20 mg by mouth as needed.   Aura Dials T, NP Taking Active Self  clopidogrel (PLAVIX) 75 MG tablet [id number] Yes Take 1 tablet (75 mg total) by mouth daily. Duke Salvia, MD Taking Active   Crisaborole [facility] An) 2 % OINT [id number] Yes Apply 1 application  topically daily. Use for itching and inflammation at left leg Willeen Niece, MD Taking Active   cyanocobalamin (VITAMIN B12) injection 1,000 mcg [id number]   Aura Dials T, NP  Active   Cyanocobalamin 1000 MCG/ML KIT [id number] Yes Inject 1 vial as directed every 30 (thirty) days. [provider] Taking Active   ezetimibe (ZETIA) 10 MG tablet [id number] Yes Take 1 tablet (10 mg total) by mouth daily. Aura Dials T, NP Taking Active   gabapentin (NEURONTIN) 100 MG capsule [id number] No Take 200 mg by mouth 2 (two) times daily.  Patient not taking: Reported on 06/22/2023   [provider] Not Taking Active   glucose blood (ONETOUCH VERIO) test strip [id number] Yes UTILIIZE TO CHECK BLOOD SUGAR TWICE DAILY FASTING IN MORNING WITH GOAL<130 AND THAN 2 HOURS AFTER A MEAL WITH GGOAL<180 [name], Jolene T, NP Taking Active Self  Lancets (ONETOUCH ULTRASOFT) lancets [id number] Yes Utilize to check blood sugar twice a day, fasting in morning with goal < 130 and then 2 hours after a meal with goal <180.  Document and bring to visits. Aura Dials T, NP Taking Active   pantoprazole (PROTONIX) 40 MG tablet [id number] Yes TAKE 1 TABLET(40 MG) BY MOUTH DAILY [name], Jolene T, NP Taking Active   rosuvastatin (CRESTOR) 5 MG tablet [id number] Yes Take 1 tablet (5 mg total) by mouth daily. Sondra Barges, PA-C Taking Active            Assessment/Plan:   Medication Access/Adherence - Current adherence strategy is sufficient - Suggested patient try gabapentin starting with night-time dosing  prescribed of 200mg  at bedtime and increasing to 200mg  BID (how prescribed) if needed for neuropathy.  This is a relatively low dose and would be safe for her based on other medications and diagnoses.    Diabetes: - Currently controlled - Continue lifestyle modifications and regular follow-up with PCP  Hyperlipidemia/ASCVD Risk Reduction: - Currently controlled based on recent calculated LDL <70 - Recommend f/u direct LDL at next PCP visit; if elevated, could consider Nexlizet or Nexletol therapy  COPD: - Currently controlled.  - Reviewed rinsing/gargling water after use to help prevent thrush and possibly assist with hoarseness - Recommended sugar free hard candy or cough drops to help with hoarseness, too - Continue current regimen and regular PCP follow-up  Follow Up Plan: 3 months, but patient was given my direct number in case questions/needs arise prior  Lenna Gilford, PharmD, DPLA

## 2023-07-03 ENCOUNTER — Encounter: Payer: Self-pay | Admitting: Nurse Practitioner

## 2023-07-03 ENCOUNTER — Ambulatory Visit (INDEPENDENT_AMBULATORY_CARE_PROVIDER_SITE_OTHER): Payer: Medicare Other

## 2023-07-03 DIAGNOSIS — E538 Deficiency of other specified B group vitamins: Secondary | ICD-10-CM | POA: Diagnosis not present

## 2023-07-03 NOTE — Telephone Encounter (Signed)
Attempted to reach patient, LVM to call office back to be scheduled for her AWV.  Put in CRM.

## 2023-07-13 NOTE — Telephone Encounter (Signed)
Pt is wanting to do her AWV after or during the appointment that she has with you on Oct. 9.  Does she need to schedule with Gunnar Fusi or will you be able to do it during that visit?  Please advise.

## 2023-07-15 ENCOUNTER — Other Ambulatory Visit: Payer: Self-pay

## 2023-07-15 ENCOUNTER — Other Ambulatory Visit: Payer: Self-pay | Admitting: Nurse Practitioner

## 2023-07-15 MED ORDER — PANTOPRAZOLE SODIUM 40 MG PO TBEC: DELAYED_RELEASE_TABLET | ORAL | 0 refills | Status: DC

## 2023-07-15 NOTE — Progress Notes (Signed)
07/15/2023  Patient ID: Sharon [name], female   DOB: December 19, 1945, 77 y.o.   MRN: [id number]  Patient calling requesting refill on pantoprazole 40mg  daily.  Patient is out of medication, and previous prescription expired/is out of refills.  Patient sees PCP later this month, so I am pending a 1 month prescription to get patient through until seen.  Lenna Gilford, PharmD, DPLA

## 2023-07-16 NOTE — Telephone Encounter (Signed)
Pharmacy received refill 07/15/23. Requested Prescriptions  Refused Prescriptions Disp Refills   pantoprazole (PROTONIX) 40 MG tablet [Pharmacy Med Name: PANTOPRAZOLE 40MG  TABLETS] 90 tablet     Sig: TAKE 1 TABLET(40 MG) BY MOUTH DAILY     There is no refill protocol information for this order

## 2023-07-19 NOTE — Patient Instructions (Incomplete)
Please call to schedule your mammogram and/or bone density: [facility]  Address: [address], [place], [place] [address] Phone: [phone]  [facility] [address] [place],  [place]  [address] Phone: [phone]    Be Involved in Caring For Your Health:  Taking Medications When medications are taken as directed, they can greatly improve your health. But if they are not taken as prescribed, they may not work. In some cases, not taking them correctly can be harmful. To help ensure your treatment remains effective and safe, understand your medications and how to take them. Bring your medications to each visit for review by your provider.  Your lab results, notes, and after visit summary will be available on My Chart. We strongly encourage you to use this feature. If lab results are abnormal the clinic will contact you with the appropriate steps. If the clinic does not contact you assume the results are satisfactory. You can always view your results on My Chart. If you have questions regarding your health or results, please contact the clinic during office hours. You can also ask questions on My Chart.  We at [facility] are grateful that you chose [place] to provide your care. We strive to provide evidence-based and compassionate care and are always looking for feedback. If you get a survey from the clinic please complete this so we can hear your opinions.  COPD and Physical Activity Chronic obstructive pulmonary disease (COPD) is a long-term, or chronic, condition that affects the lungs. COPD is a general term that can be used to describe many problems that cause inflammation of the lungs and limit airflow. These conditions include chronic bronchitis and emphysema. The main symptom of COPD is shortness of breath, which makes it harder to do even simple tasks. This can also make it harder to exercise and  stay active. Talk with your health care provider about treatments to help you breathe better and actions you can take to prevent breathing problems during physical activity. What are the benefits of exercising when you have COPD? Exercising regularly is an important part of a healthy lifestyle. You can still exercise and do physical activities even though you have COPD. Exercise and physical activity improve your shortness of breath by increasing blood flow (circulation). This causes your heart to pump more oxygen through your body. Moderate exercise can: Improve oxygen use. Increase your energy level. Help with shortness of breath. Strengthen your breathing muscles. Improve heart health. Help with sleep. Improve your self-esteem and feelings of self-worth. Lower depression, stress, and anxiety. Exercise can benefit everyone with COPD. The severity of your disease may affect how hard you can exercise, especially at first, but everyone can benefit. Talk with your health care provider about how much exercise is safe for you, and which activities and exercises are safe for you. What actions can I take to prevent breathing problems during physical activity? Sign up for a pulmonary rehabilitation program. This type of program may include: Education about lung diseases. Exercise classes that teach you how to exercise and be more active while improving your breathing. This usually involves: Exercise using your lower extremities, such as a stationary bicycle. About 30 minutes of exercise, 2 to 5 times per week, for 6 to 12 weeks. Strength training, such as push-ups or leg lifts. Nutrition education. Group classes in which you can talk with others who also have COPD and learn ways to  manage stress. If you use an oxygen tank, you should use it while you exercise. Work with your health care provider to adjust your oxygen for your physical activity. Your resting flow rate is different from your flow rate  during physical activity. How to manage your breathing while exercising While you are exercising: Take slow breaths. Pace yourself, and do nottry to go too fast. Purse your lips while breathing out. Pursing your lips is similar to a kissing or whistling position. If doing exercise that uses a quick burst of effort, such as weight lifting: Breathe in before starting the exercise. Breathe out during the hardest part of the exercise, such as raising the weights. Where to find support You can find support for exercising with COPD from: Your health care provider. A pulmonary rehabilitation program. Your local health department or community health programs. Support groups, either online or in-person. Your health care provider may be able to recommend support groups. Where to find more information You can find more information about exercising with COPD from: American Lung Association: lung.org COPD Foundation: copdfoundation.org Contact a health care provider if: Your symptoms get worse. You have nausea. You have a fever. You want to start a new exercise program or a new activity. Get help right away if: You have chest pain. You cannot breathe. These symptoms may represent a serious problem that is an emergency. Do not wait to see if the symptoms will go away. Get medical help right away. Call your local emergency services (911 in the [place] Do not drive yourself to the hospital. Summary COPD is a general term that can be used to describe many different lung problems that cause lung inflammation and limit airflow. This includes chronic bronchitis and emphysema. Exercise and physical activity improve your shortness of breath by increasing blood flow (circulation). This causes your heart to provide more oxygen to your body. Contact your health care provider before starting any exercise program or new activity. Ask your health care provider what exercises and activities are safe for you. This  information is not intended to replace advice given to you by your health care provider. Make sure you discuss any questions you have with your health care provider. Document Revised: 08/07/2020 Document Reviewed: 08/07/2020 Elsevier Patient Education  2024 [employer].

## 2023-07-21 ENCOUNTER — Ambulatory Visit (INDEPENDENT_AMBULATORY_CARE_PROVIDER_SITE_OTHER): Payer: Medicare Other | Admitting: Dermatology

## 2023-07-21 ENCOUNTER — Encounter: Payer: Self-pay | Admitting: Dermatology

## 2023-07-21 DIAGNOSIS — I872 Venous insufficiency (chronic) (peripheral): Secondary | ICD-10-CM

## 2023-07-21 DIAGNOSIS — L723 Sebaceous cyst: Secondary | ICD-10-CM

## 2023-07-21 DIAGNOSIS — L821 Other seborrheic keratosis: Secondary | ICD-10-CM | POA: Diagnosis not present

## 2023-07-21 DIAGNOSIS — L820 Inflamed seborrheic keratosis: Secondary | ICD-10-CM | POA: Diagnosis not present

## 2023-07-21 DIAGNOSIS — D2371 Other benign neoplasm of skin of right lower limb, including hip: Secondary | ICD-10-CM | POA: Diagnosis not present

## 2023-07-21 DIAGNOSIS — L720 Epidermal cyst: Secondary | ICD-10-CM

## 2023-07-21 DIAGNOSIS — L729 Follicular cyst of the skin and subcutaneous tissue, unspecified: Secondary | ICD-10-CM | POA: Diagnosis not present

## 2023-07-21 NOTE — Patient Instructions (Addendum)
 Cryotherapy Aftercare  Wash gently with soap and water everyday.   Apply Vaseline and Band-Aid daily until healed.     Due to recent changes in healthcare laws, you may see results of your pathology and/or laboratory studies on MyChart before the doctors have had a chance to review them. We understand that in some cases there may be results that are confusing or concerning to you. Please understand that not all results are received at the same time and often the doctors may need to interpret multiple results in order to provide you with the best plan of care or course of treatment. Therefore, we ask that you please give [place] 2 business days to thoroughly review all your results before contacting the office for clarification. Should we see a critical lab result, you will be contacted sooner.   If You Need Anything After Your Visit  If you have any questions or concerns for your doctor, please call our main line at [phone] and press option 4 to reach your doctor's medical assistant. If no one answers, please leave a voicemail as directed and we will return your call as soon as possible. Messages left after 4 pm will be answered the following business day.   You may also send [place] a message via MyChart. We typically respond to MyChart messages within 1-2 business days.  For prescription refills, please ask your pharmacy to contact our office. Our fax number is [phone].  If you have an urgent issue when the clinic is closed that cannot wait until the next business day, you can page your doctor at the number below.    Please note that while we do our best to be available for urgent issues outside of office hours, we are not available 24/7.   If you have an urgent issue and are unable to reach [place], you may choose to seek medical care at your doctor's office, retail clinic, urgent care center, or emergency room.  If you have a medical emergency, please immediately call 911 or go to the  emergency department.  Pager Numbers  - Dr. Gwen Pounds: [phone]  - Dr. Roseanne Reno: [phone]  - Dr. Katrinka Blazing: [phone]   In the event of inclement weather, please call our main line at [phone] for an update on the status of any delays or closures.  Dermatology Medication Tips: Please keep the boxes that topical medications come in in order to help keep track of the instructions about where and how to use these. Pharmacies typically print the medication instructions only on the boxes and not directly on the medication tubes.   If your medication is too expensive, please contact our office at [phone] option 4 or send [place] a message through MyChart.   We are unable to tell what your co-pay for medications will be in advance as this is different depending on your insurance coverage. However, we may be able to find a substitute medication at lower cost or fill out paperwork to get insurance to cover a needed medication.   If a prior authorization is required to get your medication covered by your insurance company, please allow [place] 1-2 business days to complete this process.  Drug prices often vary depending on where the prescription is filled and some pharmacies may offer cheaper prices.  The website www.goodrx.com contains coupons for medications through different pharmacies. The prices here do not account for what the cost may be with help from insurance (it may be cheaper with your insurance), but the website  can give you the price if you did not use any insurance.  - You can print the associated coupon and take it with your prescription to the pharmacy.  - You may also stop by our office during regular business hours and pick up a GoodRx coupon card.  - If you need your prescription sent electronically to a different pharmacy, notify our office through [facility] or by phone at [phone] option 4.     Si Usted Necesita Algo Despus de Su Visita  Tambin puede  enviarnos un mensaje a travs de Clinical cytogeneticist. Por lo general respondemos a los mensajes de MyChart en el transcurso de 1 a 2 das hbiles.  Para renovar recetas, por favor pida a su farmacia que se ponga en contacto con nuestra oficina. Annie Sable de fax es [place] [phone].  Si tiene un asunto urgente cuando la clnica est cerrada y que no puede esperar hasta el siguiente da hbil, puede llamar/localizar a su doctor(a) al nmero que aparece a continuacin.   Por favor, tenga en cuenta que aunque hacemos todo lo posible para estar disponibles para asuntos urgentes fuera del horario de [place], no estamos disponibles las 24 horas del da, los 7 [address]  [address] de la [place].   Si tiene un problema urgente y no puede comunicarse con nosotros, puede optar por buscar atencin mdica  en el consultorio de su doctor(a), en una clnica privada, en un centro de atencin urgente o en una sala de emergencias.  Si tiene Engineer, drilling, por favor llame inmediatamente al 911 o vaya a la sala de emergencias.  Nmeros de bper  - Dr. Gwen Pounds: [phone]  - Dra. Roseanne Reno: [phone]  - Dr. Katrinka Blazing: [phone]   En caso de inclemencias del tiempo, por favor llame a Lacy Duverney principal al [phone] para una actualizacin sobre el [place] de cualquier retraso o cierre.  Consejos para la medicacin en dermatologa: Por favor, guarde las cajas en las que vienen los medicamentos de uso tpico para ayudarle a seguir las instrucciones sobre dnde y cmo usarlos. Las farmacias generalmente imprimen las instrucciones del medicamento slo en las cajas y no directamente en los tubos del [place].   Si su medicamento es muy caro, por favor, pngase en contacto con Rolm Gala llamando al [phone] y presione la opcin 4 o envenos un mensaje a travs de Clinical cytogeneticist.   No podemos decirle cul ser su copago por los medicamentos por adelantado ya que esto es diferente dependiendo de la cobertura de su  seguro. Sin embargo, es posible que podamos encontrar un medicamento sustituto a Audiological scientist un formulario para que el seguro cubra el medicamento que se considera necesario.   Si se requiere una autorizacin previa para que su compaa de seguros [place] su medicamento, por favor permtanos de 1 a 2 das hbiles para completar [address]  Los precios de los medicamentos varan con frecuencia dependiendo del Environmental consultant de dnde se surte la receta y alguna farmacias pueden ofrecer precios ms baratos.  El sitio web www.goodrx.com tiene cupones para medicamentos de Health and safety inspector. Los precios aqu no tienen en cuenta lo que podra costar con la ayuda del seguro (puede ser ms barato con su seguro), pero el sitio web puede darle el precio si no utiliz Tourist information centre manager.  - Puede imprimir el cupn correspondiente y llevarlo con su receta a la farmacia.  - Tambin puede pasar por nuestra oficina durante el horario de atencin regular y Education officer, museum una tarjeta de cupones de GoodRx.  -  Si necesita que su receta se enve electrnicamente a Psychiatrist, informe a nuestra oficina a travs de MyChart de [place] o por telfono llamando al [phone] y presione la opcin 4.

## 2023-07-21 NOTE — Progress Notes (Signed)
Follow-Up Visit   Subjective  Sharon [name] is a 77 y.o. female who presents for the following: spot at left forearm, came up recently and is a little sore. Also an irritated spot at left chest that gets irritated with bra strap. She has itchy spot in scalp she would like removed.  Patient using Eucrisa and CeraVe at legs for stasis and wearing compression. She saw Dr. Katrinka [name] last month and he recommend patient start using Sarna. Patient doesn't think it helped so she went back to CeraVe. She is concerned about the redness at lower legs.  The patient has spots, moles and lesions to be evaluated, some may be new or changing and the patient may have concern these could be cancer.   The following portions of the chart were reviewed this encounter and updated as appropriate: medications, allergies, medical history  Review of Systems:  No other skin or systemic complaints except as noted in HPI or Assessment and Plan.  Objective  Well appearing patient in no apparent distress; mood and affect are within normal limits.   A focused examination was performed of the following areas: Scalp, face, legs, arms, chest  Relevant exam findings are noted in the Assessment and Plan.  R frontal scalp x 1 Erythematous stuck-on, waxy papule   left chest, left forearm Smooth firm white papules, larger at left forearm    Assessment & Plan   SEBORRHEIC KERATOSIS - Stuck-on, waxy, tan-brown papules and/or plaques  - Benign-appearing - Discussed benign etiology and prognosis. - Observe - Call for any changes    Inflamed seborrheic keratosis R frontal scalp x 1  Symptomatic, irritating, patient would like treated.  Benign-appearing.  Call clinic for new or changing lesions.    Destruction of lesion - R frontal scalp x 1  Destruction method: cryotherapy   Informed consent: discussed and consent obtained   Lesion destroyed using liquid nitrogen: Yes   Region frozen until ice ball  extended beyond lesion: Yes   Outcome: patient tolerated procedure well with no complications   Post-procedure details: wound care instructions given   Additional details:  Prior to procedure, discussed risks of blister formation, small wound, skin dyspigmentation, or rare scar following cryotherapy. Recommend Vaseline ointment to treated areas while healing.   Milia left chest, left forearm  Irritated Cyst, Symptomatic, irritating, patient would like treated.   Acne/Milia surgery - left chest, left forearm Procedure risks and benefits were discussed with the patient and verbal consent was obtained. Following prep of the skin on the left chest, left forearm with an alcohol swab and local anesthesia injection, extraction of milia was performed with a comedone extractor following superficial incision made over their surfaces with a #11 surgical blade. Sharp debridement of cyst wall performed on L forearm.  Capillary hemostasis was achieved with 20% aluminum chloride solution. Vaseline ointment was applied to each site. The patient tolerated the procedure well.  EPIDERMAL INCLUSION CYST Exam:  - 1.5 cm firm subq nodule with overlying erythema and telangiectasias at right superior nasolabial (see photo). Pt states has been present for years and will sometimes drain stinky white stuff  - 0.5 cm yellow white firm papule left alar crease   Benign-appearing. Exam most consistent with an epidermal inclusion cyst. Discussed that a cyst is a benign growth that can grow over time and sometimes get irritated or inflamed. Recommend observation if it is not bothersome. Discussed option of surgical excision to remove it if it is growing, symptomatic, or other  changes noted. Please call for new or changing lesions so they can be evaluated.  Cyst with symptoms and/or recent change of R sup NL fold.  Discussed surgical excision to remove, including resulting scar and possible recurrence.  Patient will schedule for  surgery.   Stasis dermatitis of both legs B/L legs   Exam :  erythema and pitting edema at lower legs, worse at left than right   Chronic and persistent condition with duration or expected duration over one year. Condition is symptomatic/ bothersome to patient.  Improving but not to goal.   Patient reports skin allergy to elidel, lanolin, tmc, and is unsure of other allergies.    Stasis in the legs causes chronic leg swelling, which may result in itchy or painful rashes, skin discoloration, skin texture changes, and sometimes ulceration.  Recommend daily graduated compression hose/stockings- easiest to put on first thing in morning, remove at bedtime.  Elevate legs as much as possible. Avoid salt/sodium rich foods.   Sample of Opzelura cream given to patient to try at one leg twice daily. If able to tolerate (no allergy), will send in to pharmacy. Continue Eucrisa 2% ointment  apply topically to affected areas qd/bid. Can store in fridge to help decrease burning after applying Cont compression hose qd Cont Lymphedema pump prn flares Cont CeraVe Itch-Relief and/or CeraVe SA prn Continue oral diuretic as prescribed  DERMATOFIBROMA Exam: Firm pink/brown papulenodule with dimple sign at right medial lower knee. Treatment Plan: A dermatofibroma is a benign growth possibly related to trauma, such as an insect bite, cut from shaving, or inflamed acne-type bump.  Treatment options to remove include shave or excision with resulting scar and risk of recurrence.  Since benign-appearing and not bothersome, will observe for now.   Return for Surgery, with Dr. Roseanne [name].  Sharon [name], RMA, am acting as scribe for Sharon Niece, MD .   Documentation: I have reviewed the above documentation for accuracy and completeness, and I agree with the above.  Sharon Niece, MD

## 2023-07-22 ENCOUNTER — Ambulatory Visit: Payer: Medicare Other | Admitting: Nurse Practitioner

## 2023-07-22 ENCOUNTER — Encounter: Payer: Self-pay | Admitting: Nurse Practitioner

## 2023-07-22 VITALS — BP 130/72 | HR 76 | Temp 98.2°F | Ht 68.0 in | Wt 202.0 lb

## 2023-07-22 DIAGNOSIS — E669 Obesity, unspecified: Secondary | ICD-10-CM

## 2023-07-22 DIAGNOSIS — Z8673 Personal history of transient ischemic attack (TIA), and cerebral infarction without residual deficits: Secondary | ICD-10-CM

## 2023-07-22 DIAGNOSIS — Z0000 Encounter for general adult medical examination without abnormal findings: Secondary | ICD-10-CM | POA: Diagnosis not present

## 2023-07-22 DIAGNOSIS — E1169 Type 2 diabetes mellitus with other specified complication: Secondary | ICD-10-CM | POA: Diagnosis not present

## 2023-07-22 DIAGNOSIS — I5032 Chronic diastolic (congestive) heart failure: Secondary | ICD-10-CM | POA: Diagnosis not present

## 2023-07-22 DIAGNOSIS — F419 Anxiety disorder, unspecified: Secondary | ICD-10-CM

## 2023-07-22 DIAGNOSIS — E1142 Type 2 diabetes mellitus with diabetic polyneuropathy: Secondary | ICD-10-CM

## 2023-07-22 DIAGNOSIS — Z6830 Body mass index (BMI) 30.0-30.9, adult: Secondary | ICD-10-CM

## 2023-07-22 DIAGNOSIS — E785 Hyperlipidemia, unspecified: Secondary | ICD-10-CM | POA: Diagnosis not present

## 2023-07-22 DIAGNOSIS — E6609 Other obesity due to excess calories: Secondary | ICD-10-CM

## 2023-07-22 DIAGNOSIS — E1351 Other specified diabetes mellitus with diabetic peripheral angiopathy without gangrene: Secondary | ICD-10-CM

## 2023-07-22 DIAGNOSIS — J432 Centrilobular emphysema: Secondary | ICD-10-CM

## 2023-07-22 DIAGNOSIS — G720 Drug-induced myopathy: Secondary | ICD-10-CM

## 2023-07-22 DIAGNOSIS — M85852 Other specified disorders of bone density and structure, left thigh: Secondary | ICD-10-CM | POA: Diagnosis not present

## 2023-07-22 MED ORDER — PANTOPRAZOLE SODIUM 40 MG PO TBEC: DELAYED_RELEASE_TABLET | ORAL | 4 refills | Status: DC

## 2023-07-22 NOTE — Assessment & Plan Note (Signed)
BMI 30.71 with T2DM, HLD, COPD.  Recommended eating smaller high protein, low fat meals more frequently and exercising 30 mins a day 5 times a week with a goal of 10-15lb weight loss in the next 3 months. Patient voiced their understanding and motivation to adhere to these recommendations.

## 2023-07-22 NOTE — Assessment & Plan Note (Signed)
Chronic, stable.  Continue Breztri, for triple therapy, is tolerating and Albuterol as needed.  Continue to collaborate with pulmonary as needed in future.  Continue annual lung screening until age 77.  Will continue to collaborate with [facility] pharmacist for assistance on medications.

## 2023-07-22 NOTE — Assessment & Plan Note (Signed)
Chronic, stable, only uses PRN.  Continue daily Citalopram and monitor.  Adjust dose or medication as needed, do not increase Celexa to 40 MG due to patient age >57.  Denies SI/HI. ?

## 2023-07-22 NOTE — Progress Notes (Signed)
BP 130/72   Pulse 76   Temp 98.2 F (36.8 C) (Oral)   Ht 5\' 8"  (1.727 m)   Wt 202 lb (91.6 kg)   LMP  (LMP Unknown)   SpO2 97%   BMI 30.71 kg/m    Subjective:    Patient ID: Sharon [name], female    DOB: 1946-02-21, 77 y.o.   MRN: [id number]  HPI: Sharon [name] is a 77 y.o. female presenting on 07/22/2023 for Medicare Wellness and follow-up visit. Current medical complaints include:none  She currently lives with: husband Menopausal Symptoms: no  DIABETES A1c last in July was 6.4%.  No current medications, was on Metformin which caused discomfort.  Continues diet focus. Hypoglycemic episodes:no Polydipsia/polyuria: no Visual disturbance: no Chest pain: no Paresthesias: no Glucose Monitoring: yes  Accucheck frequency:  twice a week  Fasting glucose: often <100  Post prandial: 144 to 150  Evening:  Before meals: Taking Insulin?: no  Long acting insulin:  Short acting insulin: Blood Pressure Monitoring: rarely Retinal Examination: Up to Date Foot Exam: Up to Date Diabetic Education: Not Completed Pneumovax: Up to Date Influenza: Up to Date Aspirin: yes   COPD Using Breztri daily and Albuterol PRN.  In past took [employer] and Symbicort.   Last lung screening was 08/01/22 noting mild centrilobular emphysema and aortic atherosclerosis.  Next scheduled 08/03/23. COPD status: stable Satisfied with current treatment?: yes Oxygen use: no Dyspnea frequency: none Cough frequency: none Rescue inhaler frequency:  every morning before takes Breztri Limitation of activity: no Productive cough: none Last Spirometry: unknown Pneumovax: Up to Date Influenza: Up to Date   HYPERLIPIDEMIA Last visit with cardiology was 03/26/23.  Continues to tolerate Rosuvastatin 5 MG and Zetia with benefit. Tried Praluent in past, which caused worsening joint pain -- same multiple statins was unable to take these. Abdominal aortic ectatic that is < 4 cm, which is being monitored by vascular,  last visit 03/26/23.   Bruises easily -- continues on Plavix and ASA daily due to CVA history. History of CVA in 2019 and then 07/02/22.  Had visit with neurology last on 06/11/23, prescribed Gabapentin but has not taken yet due to concerns for side effects.  Her testing showed chronic severe sensory polyneuropathy.  Hyperlipidemia status: good compliance Satisfied with current treatment?  yes Side effects:  no Medication compliance: good compliance Past cholesterol meds: all statins and Praluent Supplements: none Aspirin:  yes The ASCVD Risk score (Arnett DK, et al., 2019) failed to calculate for the following reasons:   The patient has a prior MI or stroke diagnosis Chest pain:  no Coronary artery disease:  no Family history CAD:  yes Family history early CAD:  no   LYMPHEDEMA WITH CHRONIC VENOUS INSUFFICIENCY: Does take Tylenol for pain, occasionally offers benefit  Continues to use lymphedema pumps at home. Dermatology last 07/21/23 and vascular 03/26/23.  Previous treatments for cellulitis, last treated 06/17/23. Duration: chronic Location: BLE History of trauma in area: no Pain:  occasional Quality: yes Severity: mild Redness:  occasional Swelling: occasional Oozing: no Pus: no Fevers: no Nausea/vomiting: no Status: stable Treatments attempted:none   DEPRESSION Continues on Celexa 20 MG as needed for mood, uses for when driving down highway -- reports this helps on a PRN basis.  Mood status: stable Satisfied with current treatment?: yes Symptom severity: mild  Duration of current treatment : chronic Side effects: no Medication compliance: good compliance Depressed mood: no Anxious mood: no Anhedonia: no Significant weight loss or gain: no  Insomnia: no Fatigue: yes Feelings of worthlessness or guilt: no Impaired concentration/indecisiveness: no Suicidal ideations: no Hopelessness: no Crying spells: no    07/22/2023    1:32 PM 04/21/2023    4:37 PM 10/07/2022     1:11 PM 06/03/2022    9:24 AM 06/03/2022    8:57 AM  Depression screen PHQ 2/9  Decreased Interest 0 1 0 0 0  Down, Depressed, Hopeless 0 1 0 0 0  PHQ - 2 Score 0 2 0 0 0  Altered sleeping 0 0 1    Tired, decreased energy 1 0 1    Change in appetite 0 0 0    Feeling bad or failure about yourself  0 0 0    Trouble concentrating 0 0 0    Moving slowly or fidgety/restless 0 0 1    Suicidal thoughts 0 0 0    PHQ-9 Score 1 2 3     Difficult doing work/chores Not difficult at all  Not difficult at all        07/22/2023    1:32 PM 04/21/2023    4:37 PM 01/15/2021    1:37 PM 12/07/2018    1:22 PM  GAD 7 : Generalized Anxiety Score  Nervous, Anxious, on Edge 1 1 3  0  Control/stop worrying 0 1 3 0  Worry too much - different things 0 1 3 0  Trouble relaxing 0 0 0 0  Restless 0 0 0 0  Easily annoyed or irritable 0 0 1 0  Afraid - awful might happen 0 1 0 0  Total GAD 7 Score 1 4 10  0  Anxiety Difficulty Not difficult at all Not difficult at all        07/03/2022    4:00 PM 07/03/2022    7:40 PM 10/07/2022    1:11 PM 10/15/2022   11:01 AM 07/22/2023    1:31 PM  Fall Risk  Falls in the past year?   0  0  Was there an injury with Fall?   0  0  Fall Risk Category Calculator   0  0  Fall Risk Category (Retired)   Low    (RETIRED) Patient Fall Risk Level Low fall risk Low fall risk  Low fall risk   Patient at Risk for Falls Due to   No Fall Risks  No Fall Risks  Fall risk Follow up   Falls evaluation completed  Falls evaluation completed    Functional Status Survey: Is the patient deaf or have difficulty hearing?: No Does the patient have difficulty seeing, even when wearing glasses/contacts?: No Does the patient have difficulty concentrating, remembering, or making decisions?: No Does the patient have difficulty walking or climbing stairs?: No Does the patient have difficulty dressing or bathing?: No Does the patient have difficulty doing errands alone such as visiting a doctor's office or  shopping?: No  Past Medical History:  Past Medical History:  Diagnosis Date   Aneurysm of descending thoracic aorta (HCC)    Anxiety    Arthritis    hands, upper back   Asthma    Cervical cancer (HCC)    in situ 1970's   CHF (congestive heart failure) (HCC)    COPD (chronic obstructive pulmonary disease) (HCC)    Coronary artery disease    COVID    GERD (gastroesophageal reflux disease)    History of cervical cancer    HLD (hyperlipidemia)    Lymphedema    Menopausal disorder    Neuropathy  Osteoporosis    Pneumonia 1960   Pre-diabetes    PVD (peripheral vascular disease) (HCC)    Spasm of abdominal muscles of right side    intermittent   Stroke (HCC) 2019   TMJ (dislocation of temporomandibular joint)    Wears dentures    partial lower    Surgical History:  Past Surgical History:  Procedure Laterality Date   ABDOMINAL HYSTERECTOMY  1970's   bladder botox  2005   BLADDER SUSPENSION  2004   CATARACT EXTRACTION W/PHACO Right 03/09/2018   Procedure: CATARACT EXTRACTION PHACO AND INTRAOCULAR LENS PLACEMENT (IOC) right;  Surgeon: Nevada Crane, MD;  Location: [facility];  Service: Ophthalmology;  Laterality: Right;  CALL CELL 1ST   CATARACT EXTRACTION W/PHACO Left 04/19/2018   Procedure: CATARACT EXTRACTION PHACO AND INTRAOCULAR LENS PLACEMENT (IOC)  LEFT;  Surgeon: Nevada Crane, MD;  Location: [facility];  Service: Ophthalmology;  Laterality: Left;   CHOLECYSTECTOMY, LAPAROSCOPIC  02/18/2023   COLONOSCOPY WITH PROPOFOL N/A 12/13/2015   Procedure: COLONOSCOPY WITH PROPOFOL;  Surgeon: Midge Minium, MD;  Location: [facility];  Service: Endoscopy;  Laterality: N/A;   COLONOSCOPY WITH PROPOFOL N/A 03/14/2021   Procedure: COLONOSCOPY WITH PROPOFOL;  Surgeon: Midge Minium, MD;  Location: [facility];  Service: Endoscopy;  Laterality: N/A;  diabetic   LOOP RECORDER INSERTION N/A 01/07/2018   Procedure: LOOP RECORDER INSERTION;   Surgeon: Duke Salvia, MD;  Location: [facility] INVASIVE CV LAB;  Service: Cardiovascular;  Laterality: N/A;   LOOP RECORDER REMOVAL     POLYPECTOMY N/A 12/13/2015   Procedure: POLYPECTOMY;  Surgeon: Midge Minium, MD;  Location: [facility];  Service: Endoscopy;  Laterality: N/A;  SIGMOID COLON POLYPS X  5   POLYPECTOMY N/A 03/14/2021   Procedure: POLYPECTOMY;  Surgeon: Midge Minium, MD;  Location: [facility];  Service: Endoscopy;  Laterality: N/A;   SHOULDER ARTHROSCOPY W/ ROTATOR CUFF REPAIR Right 1998   TEE WITHOUT CARDIOVERSION N/A 01/06/2018   Procedure: TRANSESOPHAGEAL ECHOCARDIOGRAM (TEE);  Surgeon: Antonieta Iba, MD;  Location: [facility] ORS;  Service: Cardiovascular;  Laterality: N/A;   TONSILLECTOMY AND ADENOIDECTOMY     TOOTH EXTRACTION     July 2023    Medications:  Current Outpatient Medications on File Prior to Visit  Medication Sig   acetaminophen (TYLENOL) 500 MG tablet Take 500 mg by mouth every 6 (six) hours as needed.   albuterol (VENTOLIN HFA) 108 (90 Base) MCG/ACT inhaler INHALE 2 PUFFS BY MOUTH INTO THE LUNGS EVERY 6 HOURS AS NEEDED FOR WHEEZING OR SHORTNESS OF BREATH   aspirin EC 81 MG tablet Take 81 mg by mouth 3 (three) times a week. Swallow whole. M-W-Fr   Blood Glucose Monitoring Suppl (ONETOUCH VERIO) w/Device KIT Utilize to check blood sugar twice a day, fasting in morning with goal < 130 and then 2 hours after a meal with goal <180.  Document and bring to visits.   BREZTRI AEROSPHERE 160-9-4.8 MCG/ACT AERO INHALE 2 PUFFS BY MOUTH TWICE DAILY AS DIRECTED   cetirizine (ZYRTEC) 10 MG tablet Take 1 tablet (10 mg total) by mouth daily.   Cholecalciferol (VITAMIN D3) 50 MCG (2000 UT) CAPS Take 1 capsule by mouth daily.   citalopram (CELEXA) 20 MG tablet Take 1 tablet (20 mg total) by mouth as needed. (Patient taking differently: Take 20 mg by mouth as needed.)   clopidogrel (PLAVIX) 75 MG tablet Take 1 tablet (75 mg total) by mouth daily.   Crisaborole  (EUCRISA) 2 %  OINT Apply 1 application  topically daily. Use for itching and inflammation at left leg   Cyanocobalamin 1000 MCG/ML KIT Inject 1 vial as directed every 30 (thirty) days.   ezetimibe (ZETIA) 10 MG tablet Take 1 tablet (10 mg total) by mouth daily.   glucose blood (ONETOUCH VERIO) test strip UTILIIZE TO CHECK BLOOD SUGAR TWICE DAILY FASTING IN MORNING WITH GOAL<130 AND THAN 2 HOURS AFTER A MEAL WITH GGOAL<180   Lancets (ONETOUCH ULTRASOFT) lancets Utilize to check blood sugar twice a day, fasting in morning with goal < 130 and then 2 hours after a meal with goal <180.  Document and bring to visits.   rosuvastatin (CRESTOR) 5 MG tablet Take 1 tablet (5 mg total) by mouth daily.   Current Facility-Administered Medications on File Prior to Visit  Medication   cyanocobalamin (VITAMIN B12) injection 1,000 mcg    Allergies:  Allergies  Allergen Reactions   Statins Other (See Comments)    Myalgias and joint pain on atorvastatin 40mg  and rosuvastatin 20mg    Dermacerin [Aquaphilic]    Elidel [Pimecrolimus]     Patient reports allergy   Nsaids    Pineapple Itching    throat   Praluent [Alirocumab]     Flu like symptoms, leg swelling, rash   Quinolones     FLUOROQUINOLONES - Pt reports she was told to NEVER take these.   Baclofen Swelling   Bactrim [Sulfamethoxazole-Trimethoprim] Nausea And Vomiting   Ibuprofen Itching and Rash    Mouth swelling and itching   Lanolin Rash    Allergy to triamcinolone    Latex Rash    Some bandaids, some gloves; BLOOD TEST NEGATIVE   Librium [Chlordiazepoxide] Itching    Dizziness    Naprosyn [Naproxen] Rash    Mouth swelling and swelling   Other Rash    [place] nuts - mouth swelling    Social History:  Social History   Socioeconomic History   Marital status: Married    Spouse name: Veleda Harnois   Number of children: 1   Years of education: Not on file   Highest education level: Some college, no degree  Occupational History    Occupation: retired  Tobacco Use   Smoking status: Former    Current packs/day: 0.00    Types: Cigarettes    Start date: 07/14/1960    Quit date: 07/14/2016    Years since quitting: 7.0   Smokeless tobacco: Never  Vaping Use   Vaping status: Former  Substance and Sexual Activity   Alcohol use: Yes    Comment: Ambulance person   Drug use: No   Sexual activity: Not Currently    Birth control/protection: Post-menopausal  Other Topics Concern   Not on file  Social History Narrative   Lives with husband, manages farm   Social Determinants of Health   Financial Resource Strain: Low Risk  (07/22/2023)   Overall Financial Resource Strain (CARDIA)    Difficulty of Paying Living Expenses: Not hard at all  Food Insecurity: No Food Insecurity (07/22/2023)   Hunger Vital Sign    Worried About Running Out of Food in the Last Year: Never true    Ran Out of Food in the Last Year: Never true  Transportation Needs: No Transportation Needs (07/22/2023)   PRAPARE - Administrator, Civil Service (Medical): No    Lack of Transportation (Non-Medical): No  Physical Activity: Insufficiently Active (07/22/2023)   Exercise Vital Sign    Days of Exercise per Week: 3  days    Minutes of Exercise per Session: 30 min  Stress: No Stress Concern Present (07/22/2023)   [employer] of Occupational Health - Occupational Stress Questionnaire    Feeling of Stress : Only a little  Social Connections: Moderately Isolated (07/22/2023)   Social Connection and Isolation Panel [NHANES]    Frequency of Communication with Friends and Family: Twice a week    Frequency of Social Gatherings with Friends and Family: Twice a week    Attends Religious Services: Never    Database administrator or Organizations: No    Attends Banker Meetings: Never    Marital Status: Married  Catering manager Violence: Not At Risk (07/22/2023)   Humiliation, Afraid, Rape, and Kick questionnaire    Fear of  Current or Ex-Partner: No    Emotionally Abused: No    Physically Abused: No    Sexually Abused: No   Social History   Tobacco Use  Smoking Status Former   Current packs/day: 0.00   Types: Cigarettes   Start date: 07/14/1960   Quit date: 07/14/2016   Years since quitting: 7.0  Smokeless Tobacco Never   Social History   Substance and Sexual Activity  Alcohol Use Yes   Comment: Ambulance person    Family History:  Family History  Problem Relation Age of Onset   Diabetes Mother    Heart disease Mother    Stroke Mother    Stroke Maternal Grandmother    Hyperlipidemia Neg Hx     Past medical history, surgical history, medications, allergies, family history and social history reviewed with patient today and changes made to appropriate areas of the chart.   ROS All other ROS negative except what is listed above and in the HPI.      Objective:    BP 130/72   Pulse 76   Temp 98.2 F (36.8 C) (Oral)   Ht 5\' 8"  (1.727 m)   Wt 202 lb (91.6 kg)   LMP  (LMP Unknown)   SpO2 97%   BMI 30.71 kg/m   Wt Readings from Last 3 Encounters:  07/22/23 202 lb (91.6 kg)  06/17/23 201 lb 9.6 oz (91.4 kg)  06/04/23 200 lb 6.4 oz (90.9 kg)    Physical Exam Vitals and nursing note reviewed. Exam conducted with a chaperone present.  Constitutional:      General: She is awake. She is not in acute distress.    Appearance: She is well-developed and well-groomed. She is obese. She is not ill-appearing or toxic-appearing.  HENT:     Head: Normocephalic and atraumatic.     Right Ear: Hearing, tympanic membrane, ear canal and external ear normal. No drainage.     Left Ear: Hearing, tympanic membrane, ear canal and external ear normal. No drainage.     Nose: Nose normal.     Right Sinus: No maxillary sinus tenderness or frontal sinus tenderness.     Left Sinus: No maxillary sinus tenderness or frontal sinus tenderness.     Mouth/Throat:     Mouth: Mucous membranes are moist.      Pharynx: Oropharynx is clear. Uvula midline. No pharyngeal swelling, oropharyngeal exudate or posterior oropharyngeal erythema.  Eyes:     General: Lids are normal.        Right eye: No discharge.        Left eye: No discharge.     Extraocular Movements: Extraocular movements intact.     Conjunctiva/sclera: Conjunctivae normal.  Pupils: Pupils are equal, round, and reactive to light.     Visual Fields: Right eye visual fields normal and left eye visual fields normal.  Neck:     Thyroid: No thyromegaly.     Vascular: No carotid bruit.     Trachea: Trachea normal.  Cardiovascular:     Rate and Rhythm: Normal rate and regular rhythm.     Heart sounds: Normal heart sounds. No murmur heard.    No gallop.  Pulmonary:     Effort: Pulmonary effort is normal. No accessory muscle usage or respiratory distress.     Breath sounds: Normal breath sounds.  Chest:  Breasts:    Right: Normal.     Left: Normal.  Abdominal:     General: Bowel sounds are normal.     Palpations: Abdomen is soft. There is no hepatomegaly or splenomegaly.     Tenderness: There is no abdominal tenderness.  Musculoskeletal:        General: Normal range of motion.     Cervical back: Normal range of motion and neck supple.     Right lower leg: No edema.     Left lower leg: No edema.  Lymphadenopathy:     Head:     Right side of head: No submental, submandibular, tonsillar, preauricular or posterior auricular adenopathy.     Left side of head: No submental, submandibular, tonsillar, preauricular or posterior auricular adenopathy.     Cervical: No cervical adenopathy.     Upper Body:     Right upper body: No supraclavicular, axillary or pectoral adenopathy.     Left upper body: No supraclavicular, axillary or pectoral adenopathy.  Skin:    General: Skin is warm and dry.     Capillary Refill: Capillary refill takes less than 2 seconds.     Findings: No rash.  Neurological:     Mental Status: She is alert and  oriented to person, place, and time.     Gait: Gait is intact.     Deep Tendon Reflexes: Reflexes are normal and symmetric.     Reflex Scores:      Brachioradialis reflexes are 2+ on the right side and 2+ on the left side.      Patellar reflexes are 2+ on the right side and 2+ on the left side. Psychiatric:        Attention and Perception: Attention normal.        Mood and Affect: Mood normal.        Speech: Speech normal.        Behavior: Behavior normal. Behavior is cooperative.        Thought Content: Thought content normal.        Judgment: Judgment normal.       07/22/2023    2:00 PM 06/03/2022    8:48 AM 04/19/2021    1:20 PM 04/09/2020    8:44 AM 01/14/2018    9:53 AM  6CIT Screen  What Year? 0 points 0 points 0 points 0 points 0 points  What month? 0 points 0 points 0 points 0 points 0 points  What time? 0 points 0 points 0 points 0 points 0 points  Count back from 20 0 points 0 points 0 points 0 points 0 points  Months in reverse 0 points 0 points 0 points 0 points 0 points  Repeat phrase 0 points 0 points 0 points 0 points 0 points  Total Score 0 points 0 points 0 points 0 points 0  points     Results for orders placed or performed in visit on 04/21/23  Bayer DCA Hb A1c Waived  Result Value Ref Range   HB A1C (BAYER DCA - WAIVED) 6.4 (H) 4.8 - 5.6 %  Comprehensive metabolic panel  Result Value Ref Range   Glucose 85 70 - 99 mg/dL   BUN 18 8 - 27 mg/dL   Creatinine, Ser 1.61 (H) 0.57 - 1.00 mg/dL   eGFR 46 (L) >09 UE/AVW/0.98   BUN/Creatinine Ratio 15 12 - 28   Sodium 143 134 - 144 mmol/L   Potassium 4.1 3.5 - 5.2 mmol/L   Chloride 107 (H) 96 - 106 mmol/L   CO2 23 20 - 29 mmol/L   Calcium 8.9 8.7 - 10.3 mg/dL   Total Protein 5.9 (L) 6.0 - 8.5 g/dL   Albumin 4.0 3.8 - 4.8 g/dL   Globulin, Total 1.9 1.5 - 4.5 g/dL   Bilirubin Total 0.3 0.0 - 1.2 mg/dL   Alkaline Phosphatase 87 44 - 121 IU/L   AST 19 0 - 40 IU/L   ALT 16 0 - 32 IU/L  Lipid Panel w/o Chol/HDL Ratio   Result Value Ref Range   Cholesterol, Total 119 100 - 199 mg/dL   Triglycerides 119 0 - 149 mg/dL   HDL 58 >14 mg/dL   VLDL Cholesterol Cal 19 5 - 40 mg/dL   LDL Chol Calc (NIH) 42 0 - 99 mg/dL  Vitamin N82  Result Value Ref Range   Vitamin B-12 762 232 - 1,245 pg/mL  VITAMIN D 25 Hydroxy (Vit-D Deficiency, Fractures)  Result Value Ref Range   Vit D, 25-Hydroxy 66.2 30.0 - 100.0 ng/mL   *Note: Due to a large number of results and/or encounters for the requested time period, some results have not been displayed. A complete set of results can be found in Results Review.      Assessment & Plan:   Problem List Items Addressed This Visit       Cardiovascular and Mediastinum   Chronic diastolic CHF (congestive heart failure) (HCC)    Chronic, stable.  EF 55-60% on 07/03/22. Euvolemic.  Continue collaboration with cardiology.  Recommend: - Reminded to call for an overnight weight gain of >2 pounds or a weekly weight gain of >5 pounds - not adding salt to food and read food labels. Reviewed the importance of keeping daily sodium intake to 2000mg  daily. - Avoid Ibuprofen products      Relevant Orders   Comprehensive metabolic panel   Lipid Panel w/o Chol/HDL Ratio   Magnesium   Peripheral vascular disease due to secondary diabetes (HCC)    Chronic, ongoing with lymphedema and PVD.  Continue to collaborate with vascular and current medication regimen.  A1c remains stable at 6.4% on check last visit, recheck today.  Continue diet focus for diabetes control.      Relevant Orders   Comprehensive metabolic panel   Lipid Panel w/o Chol/HDL Ratio     Respiratory   Centrilobular emphysema (HCC)    Chronic, stable.  Continue Breztri, for triple therapy, is tolerating and Albuterol as needed.  Continue to collaborate with pulmonary as needed in future.  Continue annual lung screening until age 43.  Will continue to collaborate with [facility] pharmacist for assistance on medications.       Relevant Orders   CBC with Differential/Platelet     Endocrine   Hyperlipidemia associated with type 2 diabetes mellitus (HCC)    Chronic, ongoing. Continue Zetia  at this time as is tolerating + Rosuvastatin at low dose. Have tried multiple statins with ongoing myalgias with use.  Praluent and Zetia also caused discomfort.  Check lipid panel and CMP today with goal LDL<70.  CCM collaboration continues.        Relevant Orders   HgB A1c   Comprehensive metabolic panel   Lipid Panel w/o Chol/HDL Ratio   Peripheral sensory neuropathy due to type 2 diabetes mellitus (HCC)    Chronic, ongoing.  A1c 6.4% last visit, will continue diet focus at home. Recheck A1c today. Recommend she trial Gabapentin 100 MG taking at night as recommended to see if benefit to neuropathy discomfort.  Discussed at length with her.  Continue collaboration with neurology, recent notes reviewed.      Relevant Orders   HgB A1c   Type 2 diabetes mellitus with morbid obesity (HCC)    Chronic, stable, remains off medication at this time -- did not tolerate Metformin in past.  Could consider trial of Farxiga in future.  A1c 6.4% last visit, stable, and urine ALB 10 February 2023. Recheck A1c today, sugars stable at home.  At this time continue diet focus, but with history of CVA goal is to maintain A1c <6.5%.  Monitor sugars at home a few days a week and document for provider. - No ACE or ARB on board due to lower BP readings at baseline.  Statin on board. - Vaccinations up to date. - Foot and eye exams up to date.      Relevant Orders   HgB A1c   Comprehensive metabolic panel     Musculoskeletal and Integument   Drug-induced myopathy    Refer to HLD plan of care.      Osteopenia of neck of left femur    Noted on DEXA 08/05/2017.  Will plan repeat in October -- discussed with her and ordered, she will schedule.  Continue Vitamin D supplement daily and adequate calcium intake.  Check Vit D next visit.      Relevant  Orders   DG Bone Density     Other   Anxiety    Chronic, stable, only uses PRN.  Continue daily Citalopram and monitor.  Adjust dose or medication as needed, do not increase Celexa to 40 MG due to patient age >94.  Denies SI/HI.      History of CVA (cerebrovascular accident)    History of in 2019, most recent even 07/02/22 = atheroembolic causing CVA (cerebral vascular accident) of right MCA distribution.  At this time continue Zetia and Crestor at low dose, has not tolerated statins in past, even on decreased pattern.  Check labs today.  Discussed neuro goals: A1c <6.5% (current 6.4%), BP <130/80, and LDL <70. Continue to collaborate with neurology.        Obesity    BMI 30.71 with T2DM, HLD, COPD.  Recommended eating smaller high protein, low fat meals more frequently and exercising 30 mins a day 5 times a week with a goal of 10-15lb weight loss in the next 3 months. Patient voiced their understanding and motivation to adhere to these recommendations.       Other Visit Diagnoses     Medicare annual wellness visit, subsequent    -  Primary   Due for Medicare Wellness, performed today with patient.        Follow up plan: Return in about 3 months (around 10/22/2023).   LABORATORY TESTING:  - Pap smear: not applicable  IMMUNIZATIONS:   -  Tdap: Tetanus vaccination status reviewed: last tetanus booster within 10 years. - Influenza: Up to date - Pneumovax: Up to date - Prevnar: Up to date - COVID: Up to date - HPV: Not applicable - Shingrix vaccine: Refused  SCREENING: -Mammogram: Not applicable  - Colonoscopy: Up to date  - Bone Density: Ordered today  -Hearing Test: Not applicable  -Spirometry: Not applicable   PATIENT COUNSELING:   Advised to take 1 mg of folate supplement per day if capable of pregnancy.   Sexuality: Discussed sexually transmitted diseases, partner selection, use of condoms, avoidance of unintended pregnancy  and contraceptive alternatives.   Advised  to avoid cigarette smoking.  I discussed with the patient that most people either abstain from alcohol or drink within safe limits (<=14/week and <=4 drinks/occasion for males, <=7/weeks and <= 3 drinks/occasion for females) and that the risk for alcohol disorders and other health effects rises proportionally with the number of drinks per week and how often a drinker exceeds daily limits.  Discussed cessation/primary prevention of drug use and availability of treatment for abuse.   Diet: Encouraged to adjust caloric intake to maintain  or achieve ideal body weight, to reduce intake of dietary saturated fat and total fat, to limit sodium intake by avoiding high sodium foods and not adding table salt, and to maintain adequate dietary potassium and calcium preferably from fresh fruits, vegetables, and low-fat dairy products.    Stressed the importance of regular exercise  Injury prevention: Discussed safety belts, safety helmets, smoke detector, smoking near bedding or upholstery.   Dental health: Discussed importance of regular tooth brushing, flossing, and dental visits.    NEXT PREVENTATIVE PHYSICAL DUE IN 1 YEAR. Return in about 3 months (around 10/22/2023).

## 2023-07-22 NOTE — Assessment & Plan Note (Signed)
Refer to [facility] plan of care. 

## 2023-07-22 NOTE — Assessment & Plan Note (Signed)
Chronic, ongoing with lymphedema and PVD.  Continue to collaborate with vascular and current medication regimen.  A1c remains stable at 6.4% on check last visit, recheck today.  Continue diet focus for diabetes control.

## 2023-07-22 NOTE — Assessment & Plan Note (Signed)
Noted on DEXA 08/05/2017.  Will plan repeat in October -- discussed with her and ordered, she will schedule.  Continue Vitamin D supplement daily and adequate calcium intake.  Check Vit D next visit.

## 2023-07-22 NOTE — Assessment & Plan Note (Signed)
Chronic, stable, remains off medication at this time -- did not tolerate Metformin in past.  Could consider trial of Farxiga in future.  A1c 6.4% last visit, stable, and urine ALB 10 February 2023. Recheck A1c today, sugars stable at home.  At this time continue diet focus, but with history of CVA goal is to maintain A1c <6.5%.  Monitor sugars at home a few days a week and document for provider. - No ACE or ARB on board due to lower BP readings at baseline.  Statin on board. - Vaccinations up to date. - Foot and eye exams up to date.

## 2023-07-22 NOTE — Assessment & Plan Note (Signed)
Chronic, ongoing. Continue Zetia at this time as is tolerating + Rosuvastatin at low dose. Have tried multiple statins with ongoing myalgias with use.  Praluent and Zetia also caused discomfort.  Check lipid panel and CMP today with goal LDL<70.  CCM collaboration continues.

## 2023-07-22 NOTE — Assessment & Plan Note (Signed)
History of in 2019, most recent even 123XX123 = atheroembolic causing CVA (cerebral vascular accident) of right MCA distribution.  At this time continue Zetia and Crestor at low dose, has not tolerated statins in past, even on decreased pattern.  Check labs today.  Discussed neuro goals: A1c <6.5% (current 6.4%), BP <130/80, and LDL <70. Continue to collaborate with neurology.

## 2023-07-22 NOTE — Assessment & Plan Note (Signed)
Chronic, stable.  EF 55-60% on 07/03/22. Euvolemic.  Continue collaboration with cardiology.  Recommend: - Reminded to call for an overnight weight gain of >2 pounds or a weekly weight gain of >5 pounds - not adding salt to food and read food labels. Reviewed the importance of keeping daily sodium intake to 2000mg  daily. - Avoid Ibuprofen products

## 2023-07-22 NOTE — Assessment & Plan Note (Signed)
Chronic, ongoing.  A1c 6.4% last visit, will continue diet focus at home. Recheck A1c today. Recommend she trial Gabapentin 100 MG taking at night as recommended to see if benefit to neuropathy discomfort.  Discussed at length with her.  Continue collaboration with neurology, recent notes reviewed.

## 2023-07-23 LAB — COMPREHENSIVE METABOLIC PANEL
ALT: 16 [IU]/L (ref 0–32)
AST: 20 [IU]/L (ref 0–40)
Albumin: 4.4 g/dL (ref 3.8–4.8)
Alkaline Phosphatase: 93 [IU]/L (ref 44–121)
BUN/Creatinine Ratio: 16 (ref 12–28)
BUN: 14 mg/dL (ref 8–27)
Bilirubin Total: 0.6 mg/dL (ref 0.0–1.2)
CO2: 22 mmol/L (ref 20–29)
Calcium: 9.3 mg/dL (ref 8.7–10.3)
Chloride: 101 mmol/L (ref 96–106)
Creatinine, Ser: 0.9 mg/dL (ref 0.57–1.00)
Globulin, Total: 2.3 g/dL (ref 1.5–4.5)
Glucose: 92 mg/dL (ref 70–99)
Potassium: 4.2 mmol/L (ref 3.5–5.2)
Sodium: 141 mmol/L (ref 134–144)
Total Protein: 6.7 g/dL (ref 6.0–8.5)
eGFR: 66 mL/min/{1.73_m2} (ref >59)

## 2023-07-23 LAB — CBC WITH DIFFERENTIAL/PLATELET
Basophils Absolute: 0 10*3/uL (ref 0.0–0.2)
Basos: 0 %
EOS (ABSOLUTE): 0.1 10*3/uL (ref 0.0–0.4)
Eos: 1 %
Hematocrit: 45.7 % (ref 34.0–46.6)
Hemoglobin: 14.1 g/dL (ref 11.1–15.9)
Immature Grans (Abs): 0 10*3/uL (ref 0.0–0.1)
Immature Granulocytes: 0 %
Lymphocytes Absolute: 1.7 10*3/uL (ref 0.7–3.1)
Lymphs: 24 %
MCH: 28.9 pg (ref 26.6–33.0)
MCHC: 30.9 g/dL — ABNORMAL LOW (ref 31.5–35.7)
MCV: 94 fL (ref 79–97)
Monocytes Absolute: 0.6 10*3/uL (ref 0.1–0.9)
Monocytes: 8 %
Neutrophils Absolute: 4.8 10*3/uL (ref 1.4–7.0)
Neutrophils: 67 %
Platelets: 201 10*3/uL (ref 150–450)
RBC: 4.88 x10E6/uL (ref 3.77–5.28)
RDW: 12.3 % (ref 11.7–15.4)
WBC: 7.2 10*3/uL (ref 3.4–10.8)

## 2023-07-23 LAB — LIPID PANEL W/O CHOL/HDL RATIO
Cholesterol, Total: 135 mg/dL (ref 100–199)
HDL: 66 mg/dL (ref >39)
LDL Chol Calc (NIH): 52 mg/dL (ref 0–99)
Triglycerides: 93 mg/dL (ref 0–149)
VLDL Cholesterol Cal: 17 mg/dL (ref 5–40)

## 2023-07-23 LAB — MAGNESIUM: Magnesium: 2 mg/dL (ref 1.6–2.3)

## 2023-07-23 LAB — HEMOGLOBIN A1C
Est. average glucose Bld gHb Est-mCnc: 140 mg/dL
Hgb A1c MFr Bld: 6.5 % — ABNORMAL HIGH (ref 4.8–5.6)

## 2023-07-23 NOTE — Progress Notes (Signed)
Contacted via MyChart   Good evening Sharon [name], your labs have returned: - A1c is stable at 6.5%, did creep up a little from 6.4%, but we can continue diet focus at home. - CBC is stable with no anemia or infection. - Kidney function, creatinine and eGFR, remains normal, as is liver function, AST and ALT.  - Cholesterol levels look fabulous, continue current regimen as it is working. - Magnesium normal.  Any questions? Keep being excellent!!  Thank you for allowing me to participate in your care.  I appreciate you. Kindest regards, Edyn Popoca

## 2023-07-30 ENCOUNTER — Telehealth: Payer: Self-pay

## 2023-07-30 NOTE — Telephone Encounter (Signed)
Patient is calling to let Dr Roseanne Reno know she could not use the Opzelura cream sample because it make her skin burn and also Eucrisa ointment price has increased from $180 to $260. She still have some  [place] but she would like to know what to do when she run out of [place]

## 2023-08-03 ENCOUNTER — Ambulatory Visit
Admission: RE | Admit: 2023-08-03 | Discharge: 2023-08-03 | Disposition: A | Discharge status: Home / Self Care | Payer: Medicare Other | Source: Ambulatory Visit | Attending: Nurse Practitioner | Admitting: Nurse Practitioner

## 2023-08-03 DIAGNOSIS — Z122 Encounter for screening for malignant neoplasm of respiratory organs: Secondary | ICD-10-CM | POA: Insufficient documentation

## 2023-08-03 DIAGNOSIS — Z87891 Personal history of nicotine dependence: Secondary | ICD-10-CM | POA: Diagnosis not present

## 2023-08-04 ENCOUNTER — Ambulatory Visit (INDEPENDENT_AMBULATORY_CARE_PROVIDER_SITE_OTHER): Payer: Medicare Other

## 2023-08-04 DIAGNOSIS — E538 Deficiency of other specified B group vitamins: Secondary | ICD-10-CM

## 2023-08-04 MED ORDER — MOMETASONE FUROATE 0.1 % EX CREA: 1.0000 | TOPICAL_CREAM | Freq: Two times a day (BID) | CUTANEOUS | 0 refills | Status: DC

## 2023-08-04 NOTE — Addendum Note (Signed)
Addended by: Dorathy Daft R on: 08/04/2023 12:40 PM   Modules accepted: Orders

## 2023-08-04 NOTE — Telephone Encounter (Signed)
Left message with information per Dr. Roseanne Reno and RX sent in. aw

## 2023-08-04 NOTE — Telephone Encounter (Signed)
Patient advised of information per Dr. Roseanne Reno.  She has already tried Tacrolimus and it burned her legs as well.

## 2023-08-12 ENCOUNTER — Telehealth: Payer: Self-pay

## 2023-08-12 NOTE — Progress Notes (Signed)
08/12/2023  Patient ID: Sharon [name], female   DOB: 1946/06/12, 77 y.o.   MRN: [id number]  Returning missed call/voicemail from Sharon [name].  Patient's copay for Sharon [name] recently increased from $47 to almost $200.  Upon discussing further with pharmacist, it was discovered Sharon [name] was not being billed.  Pharmacy was able to correct claim and refund patient for most recent refill picked up.  She also states Sharon [name] has gone up to >$200, which will not be affordable moving forward.  Patient is gong to verify this claim is being billed correctly; and if copay does not end up affordable, she will contact me.  It does appear there is a PAP for Eucrisa we can see if she would be eligible for.  Lenna Gilford, PharmD, DPLA

## 2023-09-04 ENCOUNTER — Ambulatory Visit (INDEPENDENT_AMBULATORY_CARE_PROVIDER_SITE_OTHER): Payer: Medicare Other

## 2023-09-04 DIAGNOSIS — E538 Deficiency of other specified B group vitamins: Secondary | ICD-10-CM

## 2023-09-07 ENCOUNTER — Other Ambulatory Visit: Payer: Self-pay | Admitting: Nurse Practitioner

## 2023-09-08 NOTE — Telephone Encounter (Signed)
Requested medication (s) are due for refill today -no  Requested medication (s) are on the active medication list -yes  Future visit scheduled -yes  Last refill: 11/10/22 10.7g 11RF  Notes to clinic: off protocol-provider review   Requested Prescriptions  Pending Prescriptions Disp Refills   BREZTRI AEROSPHERE 160-9-4.8 MCG/ACT AERO [Pharmacy Med Name: BREZTRI AERO SPHERE INHALER 120 INH] 10.7 g 11    Sig: INHALE 2 PUFFS BY MOUTH TWICE DAILY AS DIRECTED     Off-Protocol Failed - 09/07/2023 10:09 AM      Failed - Medication not assigned to a protocol, review manually.      Passed - Valid encounter within last 12 months    Recent Outpatient Visits           1 month ago Medicare annual wellness visit, subsequent   [place] [facility] [place], [place] T, NP   2 months ago Cellulitis of left lower leg   [place] [facility] [place], [place] T, NP   3 months ago Centrilobular emphysema (HCC)   [place] [facility] [place], Eunice T, NP   4 months ago Type 2 diabetes mellitus with morbid obesity (HCC)   [place] [facility] [place], Dorie Rank, NP   5 months ago Dizziness   [place] [facility] [place], Oswaldo Conroy, PA-C       Future Appointments             In 1 month [name], Dorie Rank, NP [place] [facility]   In 1 month Willeen Niece, MD [facility]               Requested Prescriptions  Pending Prescriptions Disp Refills   BREZTRI AEROSPHERE 160-9-4.8 MCG/ACT AERO [Pharmacy Med Name: BREZTRI AERO SPHERE INHALER 120 INH] 10.7 g 11    Sig: INHALE 2 PUFFS BY MOUTH TWICE DAILY AS DIRECTED     Off-Protocol Failed - 09/07/2023 10:09 AM      Failed - Medication not assigned to a protocol, review manually.      Passed - Valid encounter within last 12 months    Recent Outpatient Visits           1 month ago Medicare annual wellness visit, subsequent    [place] [facility] [place], [place] T, NP   2 months ago Cellulitis of left lower leg   [place] [facility] [place], [place] T, NP   3 months ago Centrilobular emphysema (HCC)   [place] [facility] [place], [place] T, NP   4 months ago Type 2 diabetes mellitus with morbid obesity (HCC)   [place] [facility] [place], Corrie Dandy T, NP   5 months ago Dizziness   [place] [facility] [place], Oswaldo Conroy, PA-C       Future Appointments             In 1 month [name], Dorie Rank, NP [place] [facility]   In 1 month Willeen Niece, MD [facility]

## 2023-09-21 ENCOUNTER — Other Ambulatory Visit: Payer: Self-pay

## 2023-09-21 MED ORDER — "SYRINGE/NEEDLE (DISP) 22G X 1-1/2"" 1 ML MISC": 3 refills | Status: AC

## 2023-09-21 MED ORDER — CYANOCOBALAMIN 1000 MCG/ML IJ KIT: PACK | INTRAMUSCULAR | 3 refills | Status: AC

## 2023-09-21 NOTE — Progress Notes (Signed)
   09/21/2023  Patient ID: Sharon [name], female   DOB: 11-24-1945, 77 y.o.   MRN: [id number]  S/O Patient outreach to check on access/adherence to medications  Medication Management -Patient has not had any further issues with both insurance plans not being billed for prescription refills.  Pharmacy is aware of both plans and how to bill to both plans successfully.  Patient and husband also have override code provided by insurance to provide to pharmacy if issues occur in the future. -Endorses having an appointment 10/05/23 at [facility] for her monthly B12 injection.  After this dose, she would like to begin filling the medication/syringe/needle at [facility] and giving to herself at home.  A/P  Medication Management -Patient has my direct number and will contact me if there are future billing or cost concerns with medications -Pending prescriptions for b12 and syringe/needle to go to [facility] and be placed on hold until patient requests fills.  Consulting PCP to make sure she is in agreement with this plan based on patient's desire to self-administer moving forward  Lenna Gilford, PharmD, DPLA

## 2023-09-23 ENCOUNTER — Telehealth: Payer: Self-pay

## 2023-09-23 NOTE — Progress Notes (Signed)
   09/23/2023  Patient ID: Sharon [name], female   DOB: 1946/07/15, 77 y.o.   MRN: [id number]  Returning missed call/voicemail from patient, and she is stating [facility] filled her B12 but not syringes/needles to inject with.  She is wanting clarification, because she was not expecting to get this medication yet, either.  I informed her the prescriptions for B12 and needles/syringe were sent to [facility] with notes to place on hold until she requested since she will get next injection this month at [facility].  She will hold on to the B12 until she is due for an injection in January, and she will request that [facility] also fill prescription for needle/syringe that was also sent on 12/9.  Patient states she has had several issues with [facility] in the past few months, so I did advise that I am happy to assist with medication transfers if there is another pharmacy contacted with [facility] she would like to switch to.  She will contact me if there are future needs.  Lenna Gilford, PharmD, DPLA

## 2023-09-25 ENCOUNTER — Ambulatory Visit (INDEPENDENT_AMBULATORY_CARE_PROVIDER_SITE_OTHER): Payer: Medicare Other | Admitting: Nurse Practitioner

## 2023-09-25 VITALS — BP 122/69 | HR 79 | Resp 16 | Wt 200.0 lb

## 2023-09-25 DIAGNOSIS — I890 Lymphedema, not elsewhere classified: Secondary | ICD-10-CM

## 2023-09-25 DIAGNOSIS — E1169 Type 2 diabetes mellitus with other specified complication: Secondary | ICD-10-CM | POA: Diagnosis not present

## 2023-09-25 DIAGNOSIS — I714 Abdominal aortic aneurysm, without rupture, unspecified: Secondary | ICD-10-CM | POA: Diagnosis not present

## 2023-09-28 ENCOUNTER — Encounter: Payer: Self-pay | Admitting: Physician Assistant

## 2023-09-28 ENCOUNTER — Ambulatory Visit: Payer: Medicare Other | Attending: Physician Assistant | Admitting: Physician Assistant

## 2023-09-28 VITALS — BP 115/53 | HR 65 | Ht 69.0 in | Wt 202.6 lb

## 2023-09-28 DIAGNOSIS — I890 Lymphedema, not elsewhere classified: Secondary | ICD-10-CM | POA: Diagnosis not present

## 2023-09-28 DIAGNOSIS — E785 Hyperlipidemia, unspecified: Secondary | ICD-10-CM

## 2023-09-28 DIAGNOSIS — I5189 Other ill-defined heart diseases: Secondary | ICD-10-CM

## 2023-09-28 DIAGNOSIS — I700 Atherosclerosis of aorta: Secondary | ICD-10-CM | POA: Diagnosis not present

## 2023-09-28 DIAGNOSIS — I639 Cerebral infarction, unspecified: Secondary | ICD-10-CM | POA: Diagnosis not present

## 2023-09-28 DIAGNOSIS — I77819 Aortic ectasia, unspecified site: Secondary | ICD-10-CM

## 2023-09-28 DIAGNOSIS — I2510 Atherosclerotic heart disease of native coronary artery without angina pectoris: Secondary | ICD-10-CM | POA: Diagnosis not present

## 2023-09-28 DIAGNOSIS — I714 Abdominal aortic aneurysm, without rupture, unspecified: Secondary | ICD-10-CM

## 2023-09-28 DIAGNOSIS — R490 Dysphonia: Secondary | ICD-10-CM | POA: Diagnosis not present

## 2023-09-28 NOTE — Patient Instructions (Signed)
 Medication Instructions:  Your Physician recommend you continue on your current medication as directed.    *If you need a refill on your cardiac medications before your next appointment, please call your pharmacy*   Lab Work: None ordered at this time     Follow-Up: At [facility], you and your health needs are our priority.  As part of our continuing mission to provide you with exceptional heart care, we have created designated Provider Care Teams.  These Care Teams include your primary Cardiologist (physician) and Advanced Practice Providers (APPs -  Physician Assistants and Nurse Practitioners) who all work together to provide you with the care you need, when you need it.  We recommend signing up for the patient portal called "MyChart".  Sign up information is provided on this After Visit Summary.  MyChart is used to connect with patients for Virtual Visits (Telemedicine).  Patients are able to view lab/test results, encounter notes, upcoming appointments, etc.  Non-urgent messages can be sent to your provider as well.   To learn more about what you can do with MyChart, go to [email or web address].    Your next appointment:   6 month(s)  Provider:   You may see Julien Nordmann, MD or one of the following Advanced Practice Providers on your designated Care Team:   Eula Listen, [place]

## 2023-09-28 NOTE — Progress Notes (Signed)
Cardiology Office Note    Date:  09/28/2023   ID:  [name], Sharon Apr 03, 1946, MRN [id number]  PCP:  Marjie Skiff, NP  Cardiologist:  Julien Nordmann, MD  Electrophysiologist:  Sherryl Manges, MD   Chief Complaint: Follow up  History of Present Illness:   Sharon [name] is a 77 y.o. 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   She was admitted to the hospital in 12/2017 with slurred speech and weakness involving the left hand with associated facial droop.  She was found to have multiple right cerebral infarcts concerning for embolic phenomena.  Carotid artery ultrasound showed minor carotid artery atherosclerosis with no hemodynamically significant disease.  Surface echo showed an EF of 70% with no regional wall motion abnormalities.  TEE showed an EF of 55 to 65%, no regional wall motion abnormalities, no left atrial appendage thrombus, and no cardiac source of emboli identified with a normal sized aortic root, ascending thoracic aorta, aortic arch, and descending thoracic aorta.  She subsequently underwent ILR implantation with no evidence of A. fib identified.  Echo from 09/2018 showed an EF of 55 to 60%, no regional wall motion abnormalities, grade 1 diastolic dysfunction, upper limit of normal PASP, no significant valvular abnormalities, and a small pericardial effusion identified posterior to the heart.  She was seen in the office in 02/2021 for pre-procedure cardiac risk stratification and was doing well from a cardiac perspective.  She was referred to the lipid clinic for  further management of her hyperlipidemia, and was started on Praluent.  However, this has been discontinued secondary to injection site reaction and influenza-like symptoms.   She was admitted to the hospital in 09/2021 with contact/stasis dermatitis of the left lower extremity.  Venous Doppler was negative for DVT.  Initially, she was treated with antibiotics, though these were discontinued per ID recommendation.  She had notable improvement following initiation of steroid for contact dermatitis.     She was seen in 10/2021 noting improvement in her stasis dermatitis.  With regards to her history of CVA, ILR showed no evidence of A-fib with loop recorder being explanted in 12/2021.   Echo in 12/2021 demonstrated an EF of 55 to 60%, no regional wall motion abnormalities, grade 2 diastolic dysfunction, normal RV systolic function and ventricular cavity size, trivial mitral regurgitation, and an estimated right atrial pressure of 3 mmHg.    Chest CT 07/2022 showed stable aneurysmal dilatation of the descending thoracic aorta measuring 4 cm.   She was seen in the ED in 06/2022 left left arm tingling and numbness with radiation to her face.  CT head showed no acute intracranial hemorrhage or evidence of infarct.  MRI of the brain showed no acute intracranial abnormality.  MRA of the head/neck showed no major intracranial arterial occlusion or significant proximal stenosis with severe left vertebral artery origin stenosis and widely patent cervical carotid arteries.  She was admitted later in 06/2022 with slurred speech with CT of the head showing focal hypodensity involving the right insular cortex suspicious for an evolving acute right MCA distribution infarct.  MRI of the brain showed an  acute ischemic right MCA territory infarct without associated hemorrhage or mass effect.  MRA of the head/neck showed interval right M2 branch occlusion consistent with right MCA territory infarct with wide patency of the carotid  artery systems.  The previously noted stenosis at the origin of the left vertebral artery was more mild to moderate in appearance.  Echo during that admission showed an EF of 55 to 60%, no regional wall motion abnormalities, grade 1 diastolic dysfunction, normal RV systolic function and ventricular cavity size, aortic valve sclerosis without evidence of stenosis, and an estimated right atrial pressure of 3 mmHg.  She was seen in the ED in 10/2022 with dizziness and weakness.  CT of the head showed no evidence of hemorrhage or acute infarct.  MRI of the brain showed no acute intracranial process.  I saw her in 11/2022, at which time she was without symptoms of angina or cardiac decompensation.  She was referred to EP for consideration of reimplantation of loop recorder given recurrent cryptogenic stroke with no further plans for EP evaluation.  She was referred to the lipid clinic given documented intolerance to atorvastatin, rosuvastatin, and Praluent.  Insurance would not cover Leqvio.  She was rechallenged on low-dose rosuvastatin, though was concerned this may have been contributing to headache.   AAA ultrasound in 03/2023 demonstrated the largest aortic measurement of 3.3 cm which was unchanged when compared to study from 02/2022.  She underwent robotic assisted cholecystectomy in 02/2023 without adverse cardiac event.  She was last in the office in 03/2023 and remained without symptoms of angina or cardiac decompensation.  She had cut out carbs and in this setting her weight was down 14 pounds when compared to revisit in 11/2022.  She comes in doing well from a cardiac perspective and is without symptoms of angina or cardiac decompensation.  No dizziness, presyncope, or syncope.  No falls or symptoms concerning for bleeding.  She continues to note some intermittent hoarseness of her voice.  Weight stable.  Tries to follow a heart healthy diet.  Under increased stress surrounding the health of her husband.   Overall, feels like she is doing well from a cardiac perspective.   Labs independently reviewed: 07/2023 - magnesium 2.0, Hgb 14.1, PLT 201, TC 135, TG 93, HDL 66, LDL 52, BUN 14, serum creatinine 0.9, potassium 4.2, BUN 4.4, AST/ALT normal, A1c 6.5 01/2023 - TSH normal  Past Medical History:  Diagnosis Date   Aneurysm of descending thoracic aorta (HCC)    Anxiety    Arthritis    hands, upper back   Asthma    Cervical cancer (HCC)    in situ 1970's   CHF (congestive heart failure) (HCC)    COPD (chronic obstructive pulmonary disease) (HCC)    Coronary artery disease    COVID    GERD (gastroesophageal reflux disease)    History of cervical cancer    HLD (hyperlipidemia)    Lymphedema    Menopausal disorder    Neuropathy    Osteoporosis    Pneumonia 1960   Pre-diabetes    PVD (peripheral vascular disease) (HCC)    Spasm of abdominal muscles of right side    intermittent   Stroke (HCC) 2019   TMJ (dislocation of temporomandibular joint)    Wears dentures    partial lower    Past Surgical History:  Procedure Laterality Date   ABDOMINAL HYSTERECTOMY  1970's   bladder botox  2005   BLADDER SUSPENSION  2004   CATARACT  EXTRACTION W/PHACO Right 03/09/2018   Procedure: CATARACT EXTRACTION PHACO AND INTRAOCULAR LENS PLACEMENT (IOC) right;  Surgeon: Nevada Crane, MD;  Location: [facility];  Service: Ophthalmology;  Laterality: Right;  CALL CELL 1ST   CATARACT EXTRACTION W/PHACO Left 04/19/2018   Procedure: CATARACT EXTRACTION PHACO AND INTRAOCULAR LENS PLACEMENT (IOC)  LEFT;  Surgeon: Nevada Crane, MD;  Location: [facility];  Service: Ophthalmology;  Laterality: Left;   CHOLECYSTECTOMY, LAPAROSCOPIC  02/18/2023   COLONOSCOPY WITH PROPOFOL N/A 12/13/2015   Procedure: COLONOSCOPY WITH PROPOFOL;  Surgeon: Midge Minium, MD;  Location: [facility];  Service: Endoscopy;  Laterality: N/A;   COLONOSCOPY WITH PROPOFOL N/A 03/14/2021   Procedure:  COLONOSCOPY WITH PROPOFOL;  Surgeon: Midge Minium, MD;  Location: [facility];  Service: Endoscopy;  Laterality: N/A;  diabetic   LOOP RECORDER INSERTION N/A 01/07/2018   Procedure: LOOP RECORDER INSERTION;  Surgeon: Duke Salvia, MD;  Location: [facility] INVASIVE CV LAB;  Service: Cardiovascular;  Laterality: N/A;   LOOP RECORDER REMOVAL     POLYPECTOMY N/A 12/13/2015   Procedure: POLYPECTOMY;  Surgeon: Midge Minium, MD;  Location: [facility];  Service: Endoscopy;  Laterality: N/A;  SIGMOID COLON POLYPS X  5   POLYPECTOMY N/A 03/14/2021   Procedure: POLYPECTOMY;  Surgeon: Midge Minium, MD;  Location: [facility];  Service: Endoscopy;  Laterality: N/A;   SHOULDER ARTHROSCOPY W/ ROTATOR CUFF REPAIR Right 1998   TEE WITHOUT CARDIOVERSION N/A 01/06/2018   Procedure: TRANSESOPHAGEAL ECHOCARDIOGRAM (TEE);  Surgeon: Antonieta Iba, MD;  Location: [facility] ORS;  Service: Cardiovascular;  Laterality: N/A;   TONSILLECTOMY AND ADENOIDECTOMY     TOOTH EXTRACTION     July 2023    Current Medications: Current Meds  Medication Sig   acetaminophen (TYLENOL) 500 MG tablet Take 500 mg by mouth every 6 (six) hours as needed.   albuterol (VENTOLIN HFA) 108 (90 Base) MCG/ACT inhaler INHALE 2 PUFFS BY MOUTH INTO THE LUNGS EVERY 6 HOURS AS NEEDED FOR WHEEZING OR SHORTNESS OF BREATH   aspirin EC 81 MG tablet Take 81 mg by mouth 3 (three) times a week. Swallow whole. M-W-Fr   Blood Glucose Monitoring Suppl (ONETOUCH VERIO) w/Device KIT Utilize to check blood sugar twice a day, fasting in morning with goal < 130 and then 2 hours after a meal with goal <180.  Document and bring to visits.   BREZTRI AEROSPHERE 160-9-4.8 MCG/ACT AERO INHALE 2 PUFFS BY MOUTH TWICE DAILY AS DIRECTED   cetirizine (ZYRTEC) 10 MG tablet Take 1 tablet (10 mg total) by mouth daily.   Cholecalciferol (VITAMIN D3) 50 MCG (2000 UT) CAPS Take 1 capsule by mouth daily.   citalopram (CELEXA) 20 MG tablet Take 1 tablet (20 mg  total) by mouth as needed. (Patient taking differently: Take 20 mg by mouth as needed.)   clopidogrel (PLAVIX) 75 MG tablet Take 1 tablet (75 mg total) by mouth daily.   Crisaborole (EUCRISA) 2 % OINT Apply 1 application  topically daily. Use for itching and inflammation at left leg   Cyanocobalamin 1000 MCG/ML KIT Inject 1mL into the muscle once a month   ezetimibe (ZETIA) 10 MG tablet Take 1 tablet (10 mg total) by mouth daily.   glucose blood (ONETOUCH VERIO) test strip UTILIIZE TO CHECK BLOOD SUGAR TWICE DAILY FASTING IN MORNING WITH GOAL<130 AND THAN 2 HOURS AFTER A MEAL WITH GGOAL<180   Lancets (ONETOUCH ULTRASOFT) lancets Utilize to check blood sugar twice a day, fasting in morning with  goal < 130 and then 2 hours after a meal with goal <180.  Document and bring to visits.   mometasone (ELOCON) 0.1 % cream Apply 1 Application topically 2 (two) times daily. As needed for flares up to two weeks only   pantoprazole (PROTONIX) 40 MG tablet TAKE 1 TABLET(40 MG) BY MOUTH DAILY   rosuvastatin (CRESTOR) 5 MG tablet Take 1 tablet (5 mg total) by mouth daily.   Syringe/Needle, Disp, 22G X 1-1/2" 1 ML MISC Use to inject b12 monthly    Allergies:   Statins, Dermacerin [aquaphilic], Elidel [pimecrolimus], Nsaids, Pineapple, Praluent [alirocumab], Quinolones, Baclofen, Bactrim [sulfamethoxazole-trimethoprim], Ibuprofen, Lanolin, Latex, Librium [chlordiazepoxide], Naprosyn [naproxen], and Other   Social History   Socioeconomic History   Marital status: Married    Spouse name: Penelopi Vanhorne   Number of children: 1   Years of education: Not on file   Highest education level: Some college, no degree  Occupational History   Occupation: retired  Tobacco Use   Smoking status: Former    Current packs/day: 0.00    Types: Cigarettes    Start date: 07/14/1960    Quit date: 07/14/2016    Years since quitting: 7.2   Smokeless tobacco: Never  Vaping Use   Vaping status: Former  Substance and Sexual  Activity   Alcohol use: Yes    Comment: Ambulance person   Drug use: No   Sexual activity: Not Currently    Birth control/protection: Post-menopausal  Other Topics Concern   Not on file  Social History Narrative   Lives with husband, manages farm   Social Drivers of Health   Financial Resource Strain: Low Risk  (07/22/2023)   Overall Financial Resource Strain (CARDIA)    Difficulty of Paying Living Expenses: Not hard at all  Food Insecurity: No Food Insecurity (07/22/2023)   Hunger Vital Sign    Worried About Running Out of Food in the Last Year: Never true    Ran Out of Food in the Last Year: Never true  Transportation Needs: No Transportation Needs (07/22/2023)   PRAPARE - Administrator, Civil Service (Medical): No    Lack of Transportation (Non-Medical): No  Physical Activity: Insufficiently Active (07/22/2023)   Exercise Vital Sign    Days of Exercise per Week: 3 days    Minutes of Exercise per Session: 30 min  Stress: No Stress Concern Present (07/22/2023)   [employer] of Occupational Health - Occupational Stress Questionnaire    Feeling of Stress : Only a little  Social Connections: Moderately Isolated (07/22/2023)   Social Connection and Isolation Panel [NHANES]    Frequency of Communication with Friends and Family: Twice a week    Frequency of Social Gatherings with Friends and Family: Twice a week    Attends Religious Services: Never    Database administrator or Organizations: No    Attends Engineer, structural: Never    Marital Status: Married     Family History:  The patient's family history includes Diabetes in her mother; Heart disease in her mother; Stroke in her maternal grandmother and mother. There is no history of Hyperlipidemia.  ROS:   12-point review of systems is negative unless otherwise noted in the HPI.   EKGs/Labs/Other Studies Reviewed:    Studies reviewed were summarized above. The additional studies were  reviewed today:  2D echo 07/03/2022: 1. Left ventricular ejection fraction, by estimation, is 55 to 60%. The  left ventricle has normal function. The left ventricle  has no regional  wall motion abnormalities. Left ventricular diastolic parameters are  consistent with Grade I diastolic  dysfunction (impaired relaxation).   2. Right ventricular systolic function is normal. The right ventricular  size is normal.   3. The mitral valve is normal in structure. No evidence of mitral valve  regurgitation. No evidence of mitral stenosis.   4. The aortic valve is normal in structure. There is mild calcification  of the aortic valve. Aortic valve regurgitation is not visualized. Aortic  valve sclerosis/calcification is present, without any evidence of aortic  stenosis.   5. The inferior vena cava is normal in size with greater than 50%  respiratory variability, suggesting right atrial pressure of 3 mmHg.  __________   AAA ultrasound 02/27/2022 (vascular surgery): Summary:  Abdominal Aorta: The largest aortic measurement is 3.3 cm. Mild dilitation  again seen to the proximal abdominal aorta with normal tapering distally  to the distal aorta. Normal caliber CIA's bilaterally. Normal flow  throughout. The largest aortic diameter   remains essentially unchanged compared to prior exam. Previous diameter  measurement was 3.2 cm obtained on 08/2021.  __________   2D echo 12/25/2021: 1. Left ventricular ejection fraction, by estimation, is 55 to 60%. The  left ventricle has normal function. The left ventricle has no regional  wall motion abnormalities. Left ventricular diastolic parameters are  consistent with Grade II diastolic  dysfunction (pseudonormalization).   2. Right ventricular systolic function is normal. The right ventricular  size is normal.   3. The mitral valve is normal in structure. Trivial mitral valve  regurgitation.   4. The aortic valve was not well visualized. Aortic valve  regurgitation  is not visualized.   5. The inferior vena cava is normal in size with greater than 50%  respiratory variability, suggesting right atrial pressure of 3 mmHg. __________   2D echo 09/2018: - Left ventricle: The cavity size was normal. There was mild    concentric hypertrophy. Systolic function was normal. The    estimated ejection fraction was in the range of 55% to 60%. Wall    motion was normal; there were no regional wall motion    abnormalities. Doppler parameters are consistent with abnormal    left ventricular relaxation (grade 1 diastolic dysfunction).  - Pulmonary arteries: Systolic pressure was at the upper limits of    normal.  - Pericardium, extracardiac: A small pericardial effusion was    identified posterior to the heart. __________   TEE 12/2017: - Left ventricle: Systolic function was normal. The estimated    ejection fraction was in the range of 55% to 65%. Wall motion was    normal; there were no regional wall motion abnormalities.  - Left atrium: No evidence of thrombus in the atrial cavity or    appendage. No evidence of thrombus in the atrial cavity or    appendage.  - Right ventricle: Systolic function was normal.  - Right atrium: No evidence of thrombus in the atrial cavity or    appendage.  - Atrial septum: No defect or patent foramen ovale was identified.    Echo contrast study showed no right-to-left atrial level shunt,    at baseline or with provocation.   Impressions:   - No cardiac source of emboli was indentified.  __________   2D echo 12/2017: - Procedure narrative: Transthoracic echocardiography. Image    quality was suboptimal. The study was technically difficult.  - Left ventricle: The cavity size was normal. Systolic function was  vigorous. The estimated ejection fraction was 70%. Wall motion    was normal; there were no regional wall motion abnormalities.  - Aortic valve: Valve area (Vmax): 2.92 cm^2.   Impressions:   -  No cardiac source of emboli was indentified.   EKG:  EKG is ordered today.  The EKG ordered today demonstrates NSR, 65 bpm, no acute ST-T changes  Recent Labs: 01/13/2023: TSH 0.658 07/22/2023: ALT 16; BUN 14; Creatinine, Ser 0.90; Hemoglobin 14.1; Magnesium 2.0; Platelets 201; Potassium 4.2; Sodium 141  Recent Lipid Panel    Component Value Date/Time   CHOL 135 07/22/2023 1415   TRIG 93 07/22/2023 1415   HDL 66 07/22/2023 1415   CHOLHDL 3.6 07/03/2022 0609   VLDL 14 07/03/2022 0609   LDLCALC 52 07/22/2023 1415   LDLDIRECT 142.3 (H) 03/21/2021 1024    PHYSICAL EXAM:    VS:  BP (!) 115/53 (BP Location: Left Arm, Patient Position: Sitting)   Pulse 65   Ht 5\' 9"  (1.753 m)   Wt 202 lb 9.6 oz (91.9 kg)   LMP  (LMP Unknown)   SpO2 98%   BMI 29.92 kg/m   BMI: Body mass index is 29.92 kg/m.  Physical Exam Vitals reviewed.  Constitutional:      Appearance: She is well-developed.  HENT:     Head: Normocephalic and atraumatic.  Eyes:     General:        Right eye: No discharge.        Left eye: No discharge.  Neck:     Vascular: No JVD.  Cardiovascular:     Rate and Rhythm: Normal rate and regular rhythm.     Heart sounds: Normal heart sounds, S1 normal and S2 normal. Heart sounds not distant. No midsystolic click and no opening snap. No murmur heard.    No friction rub.  Pulmonary:     Effort: Pulmonary effort is normal. No respiratory distress.     Breath sounds: Normal breath sounds. No decreased breath sounds, wheezing, rhonchi or rales.  Chest:     Chest wall: No tenderness.  Abdominal:     General: There is no distension.  Musculoskeletal:     Cervical back: Normal range of motion.     Comments: Bilateral compression socks.  Skin:    General: Skin is warm and dry.     Nails: There is no clubbing.  Neurological:     Mental Status: She is alert and oriented to person, place, and time.  Psychiatric:        Speech: Speech normal.        Behavior: Behavior normal.         Thought Content: Thought content normal.        Judgment: Judgment normal.     Wt Readings from Last 3 Encounters:  09/28/23 202 lb 9.6 oz (91.9 kg)  09/25/23 200 lb (90.7 kg)  08/03/23 198 lb (89.8 kg)     ASSESSMENT & PLAN:   Coronary artery calcification/aortic atherosclerosis/HLD: No symptoms suggestive of angina or cardiac decompensation.  She remains on aspirin for primary prevention as outlined below along with ezetimibe and low-dose rosuvastatin.  She does not tolerate higher doses of statin.  LDL at goal.  No indication for ischemic testing at this time.  Dilated descending thoracic aorta: Stable, measuring up to 4.0 cm by CT chest in 08/2023 with recommendation to follow-up in 08/2024 during lung cancer screening chest CT. continue optimal blood pressure control.  AAA: Stable, measuring  3.3 cm on ultrasound in 03/2023.  Followed by vascular surgery.  Followed by vascular surgery.  History of recurrent cryptogenic CVA: Prior loop recorder implanted in 2019 through 10/2021 showed no evidence of A-fib. Recurrent CVA involving the right MCA territory in 06/2022.  She was reevaluated by EP without recommendation for further monitoring.  Followed by neurology with recommendation to take clopidogrel 75 mg daily and aspirin 81 mg Monday, Wednesday, and Friday.  No new deficits.  Diastolic dysfunction: Euvolemic and well compensated.  No longer requiring standing loop diuretic.  Continue optimal blood pressure control.  Lymphedema: Stable.  Continue with compression stockings and lymphedema pumps under the direction of vascular surgery.  Hoarseness: May benefit from ENT evaluation.      Disposition: F/u with Dr. Mariah Milling or an APP in 6 months, and EP as directed.    Medication Adjustments/Labs and Tests Ordered: Current medicines are reviewed at length with the patient today.  Concerns regarding medicines are outlined above. Medication changes, Labs and Tests ordered today are  summarized above and listed in the Patient Instructions accessible in Encounters.   Signed, Eula Listen, PA-C 09/28/2023 4:11 PM     [facility] [place] [address] [place], [place] [address] [phone]

## 2023-09-30 ENCOUNTER — Other Ambulatory Visit: Payer: Self-pay | Admitting: Nurse Practitioner

## 2023-09-30 NOTE — Telephone Encounter (Signed)
Requested Prescriptions  Pending Prescriptions Disp Refills   albuterol (VENTOLIN HFA) 108 (90 Base) MCG/ACT inhaler [Pharmacy Med Name: ALBUTEROL HFA INH (200 PUFFS) 8.5GM] 8.5 g 4    Sig: INHALE 2 PUFFS BY MOUTH EVERY 6 HOURS AS NEEDED FOR WHEEZING OR SHORTNESS OF BREATH     Pulmonology:  Beta Agonists 2 Passed - 09/30/2023  2:03 PM      Passed - Last BP in normal range    BP Readings from Last 1 Encounters:  09/28/23 (!) 115/53         Passed - Last Heart Rate in normal range    Pulse Readings from Last 1 Encounters:  09/28/23 65         Passed - Valid encounter within last 12 months    Recent Outpatient Visits           2 months ago Medicare annual wellness visit, subsequent   [facility] [place], [place] T, NP   3 months ago Cellulitis of left lower leg   [facility] [place], [place] T, NP   3 months ago Centrilobular emphysema (HCC)   [place] [facility] [place], [place] T, NP   5 months ago Type 2 diabetes mellitus with morbid obesity (HCC)   [place] [facility] [place], Corrie Dandy T, NP   5 months ago Dizziness   [facility] [place], Oswaldo Conroy, PA-C       Future Appointments             In 3 weeks [name], Dorie Rank, NP [facility]   In 3 weeks Willeen Niece, MD [facility]

## 2023-10-04 ENCOUNTER — Encounter (INDEPENDENT_AMBULATORY_CARE_PROVIDER_SITE_OTHER): Payer: Self-pay | Admitting: Nurse Practitioner

## 2023-10-04 NOTE — Progress Notes (Addendum)
 Subjective:    Patient ID: Sharon [name], female    DOB: [birth year], 77 y.o.   MRN: [id number] Chief Complaint  Patient presents with   Follow-up    6 month follow up    The patient returns today for follow-up evaluation of her lower extremity edema.  Her lower extremity edema has been fairly stable.  She continues to use compression and elevation and notes that she still has swelling present but is not out of control like it was initially.  She denies any open wounds or ulcerations.  She still has some stasis dermatitis noted bilaterally.    Review of Systems  Cardiovascular:  Positive for leg swelling.  All other systems reviewed and are negative.      Objective:   Physical Exam Vitals reviewed.  HENT:     Head: Normocephalic.  Cardiovascular:     Rate and Rhythm: Normal rate.     Pulses:          Dorsalis pedis pulses are 2+ on the right side and 2+ on the left side.  Pulmonary:     Effort: Pulmonary effort is normal.  Skin:    General: Skin is warm and dry.  Neurological:     Mental Status: She is alert and oriented to person, place, and time.  Psychiatric:        Mood and Affect: Mood normal.        Behavior: Behavior normal.        Thought Content: Thought content normal.        Judgment: Judgment normal.     BP 122/69   Pulse 79   Resp 16   Wt 200 lb (90.7 kg)   LMP  (LMP Unknown)   BMI 29.97 kg/m   Past Medical History:  Diagnosis Date   Aneurysm of descending thoracic aorta (HCC)    Anxiety    Arthritis    hands, upper back   Asthma    Cervical cancer (HCC)    in situ 1970's   CHF (congestive heart failure) (HCC)    COPD (chronic obstructive pulmonary disease) (HCC)    Coronary artery disease    COVID    GERD (gastroesophageal reflux disease)    History of cervical cancer    HLD (hyperlipidemia)    Lymphedema    Menopausal disorder    Neuropathy    Osteoporosis    Pneumonia 1960   Pre-diabetes    PVD (peripheral vascular disease)  (HCC)    Spasm of abdominal muscles of right side    intermittent   Stroke (HCC) 2019   TMJ (dislocation of temporomandibular joint)    Wears dentures    partial lower    Social History   Socioeconomic History   Marital status: Married    Spouse name: Mera Gunkel   Number of children: 1   Years of education: Not on file   Highest education level: Some college, no degree  Occupational History   Occupation: retired  Tobacco Use   Smoking status: Former    Current packs/day: 0.00    Types: Cigarettes    Start date: 07/14/1960    Quit date: 07/14/2016    Years since quitting: 7.2   Smokeless tobacco: Never  Vaping Use   Vaping status: Former  Substance and Sexual Activity   Alcohol use: Yes    Comment: Ambulance person   Drug use: No   Sexual activity: Not Currently    Birth control/protection: Post-menopausal  Other Topics Concern   Not on file  Social History Narrative   Lives with husband, manages farm   Social Drivers of Health   Financial Resource Strain: Low Risk  (07/22/2023)   Overall Financial Resource Strain (CARDIA)    Difficulty of Paying Living Expenses: Not hard at all  Food Insecurity: No Food Insecurity (07/22/2023)   Hunger Vital Sign    Worried About Running Out of Food in the Last Year: Never true    Ran Out of Food in the Last Year: Never true  Transportation Needs: No Transportation Needs (07/22/2023)   PRAPARE - Administrator, Civil Service (Medical): No    Lack of Transportation (Non-Medical): No  Physical Activity: Insufficiently Active (07/22/2023)   Exercise Vital Sign    Days of Exercise per Week: 3 days    Minutes of Exercise per Session: 30 min  Stress: No Stress Concern Present (07/22/2023)   [employer] of Occupational Health - Occupational Stress Questionnaire    Feeling of Stress : Only a little  Social Connections: Moderately Isolated (07/22/2023)   Social Connection and Isolation Panel [NHANES]    Frequency of  Communication with Friends and Family: Twice a week    Frequency of Social Gatherings with Friends and Family: Twice a week    Attends Religious Services: Never    Database administrator or Organizations: No    Attends Banker Meetings: Never    Marital Status: Married  Catering manager Violence: Not At Risk (07/22/2023)   Humiliation, Afraid, Rape, and Kick questionnaire    Fear of Current or Ex-Partner: No    Emotionally Abused: No    Physically Abused: No    Sexually Abused: No    Past Surgical History:  Procedure Laterality Date   ABDOMINAL HYSTERECTOMY  1970's   bladder botox  2005   BLADDER SUSPENSION  2004   CATARACT EXTRACTION W/PHACO Right 03/09/2018   Procedure: CATARACT EXTRACTION PHACO AND INTRAOCULAR LENS PLACEMENT (IOC) right;  Surgeon: Nevada Crane, MD;  Location: [facility];  Service: Ophthalmology;  Laterality: Right;  CALL CELL 1ST   CATARACT EXTRACTION W/PHACO Left 04/19/2018   Procedure: CATARACT EXTRACTION PHACO AND INTRAOCULAR LENS PLACEMENT (IOC)  LEFT;  Surgeon: Nevada Crane, MD;  Location: [facility];  Service: Ophthalmology;  Laterality: Left;   CHOLECYSTECTOMY, LAPAROSCOPIC  02/18/2023   COLONOSCOPY WITH PROPOFOL N/A 12/13/2015   Procedure: COLONOSCOPY WITH PROPOFOL;  Surgeon: Midge Minium, MD;  Location: [facility];  Service: Endoscopy;  Laterality: N/A;   COLONOSCOPY WITH PROPOFOL N/A 03/14/2021   Procedure: COLONOSCOPY WITH PROPOFOL;  Surgeon: Midge Minium, MD;  Location: [facility];  Service: Endoscopy;  Laterality: N/A;  diabetic   LOOP RECORDER INSERTION N/A 01/07/2018   Procedure: LOOP RECORDER INSERTION;  Surgeon: Duke Salvia, MD;  Location: [facility] INVASIVE CV LAB;  Service: Cardiovascular;  Laterality: N/A;   LOOP RECORDER REMOVAL     POLYPECTOMY N/A 12/13/2015   Procedure: POLYPECTOMY;  Surgeon: Midge Minium, MD;  Location: [facility];  Service: Endoscopy;  Laterality: N/A;   SIGMOID COLON POLYPS X  5   POLYPECTOMY N/A 03/14/2021   Procedure: POLYPECTOMY;  Surgeon: Midge Minium, MD;  Location: [facility];  Service: Endoscopy;  Laterality: N/A;   SHOULDER ARTHROSCOPY W/ ROTATOR CUFF REPAIR Right 1998   TEE WITHOUT CARDIOVERSION N/A 01/06/2018   Procedure: TRANSESOPHAGEAL ECHOCARDIOGRAM (TEE);  Surgeon: Antonieta Iba, MD;  Location: [facility] ORS;  Service: Cardiovascular;  Laterality: N/A;   TONSILLECTOMY AND ADENOIDECTOMY     TOOTH EXTRACTION     July 2023    Family History  Problem Relation Age of Onset   Diabetes Mother    Heart disease Mother    Stroke Mother    Stroke Maternal Grandmother    Hyperlipidemia Neg Hx     Allergies  Allergen Reactions   Statins Other (See Comments)    Myalgias and joint pain on atorvastatin 40mg  and rosuvastatin 20mg    Dermacerin [Aquaphilic]    Elidel [Pimecrolimus]     Patient reports allergy   Nsaids    Pineapple Itching    throat   Praluent [Alirocumab]     Flu like symptoms, leg swelling, rash   Quinolones     FLUOROQUINOLONES - Pt reports she was told to NEVER take these.   Baclofen Swelling   Bactrim [Sulfamethoxazole-Trimethoprim] Nausea And Vomiting   Ibuprofen Itching and Rash    Mouth swelling and itching   Lanolin Rash    Allergy to triamcinolone    Latex Rash    Some bandaids, some gloves; BLOOD TEST NEGATIVE   Librium [Chlordiazepoxide] Itching    Dizziness    Naprosyn [Naproxen] Rash    Mouth swelling and swelling   Other Rash    Estonia nuts - mouth swelling       Latest Ref Rng & Units 07/22/2023    2:15 PM 04/10/2023    1:55 PM 01/13/2023    2:41 PM  CBC  WBC 3.4 - 10.8 x10E3/uL 7.2  9.4  8.1   Hemoglobin 11.1 - 15.9 g/dL 16.1  09.6  04.5   Hematocrit 34.0 - 46.6 % 45.7  45.4  43.1   Platelets 150 - 450 x10E3/uL 201  225  180       CMP     Component Value Date/Time   NA 141 07/22/2023 1415   K 4.2 07/22/2023 1415   CL 101 07/22/2023 1415   CO2 22 07/22/2023 1415    GLUCOSE 92 07/22/2023 1415   GLUCOSE 127 (H) 12/15/2022 1119   BUN 14 07/22/2023 1415   CREATININE 0.90 07/22/2023 1415   CALCIUM 9.3 07/22/2023 1415   PROT 6.7 07/22/2023 1415   ALBUMIN 4.4 07/22/2023 1415   AST 20 07/22/2023 1415   ALT 16 07/22/2023 1415   ALKPHOS 93 07/22/2023 1415   BILITOT 0.6 07/22/2023 1415   EGFR 66 07/22/2023 1415   GFRNONAA >60 12/15/2022 1119     No results found.     Assessment & Plan:   1. Abdominal aortic aneurysm (AAA) without rupture, unspecified part (HCC) (Primary) The patient had an ectatic abdominal aortic aneurysm of 3.3 cm on March 26, 2023.  Will follow this annually.  She will follow-up in 6 months for reevaluation.  2. Lymphedema The patient has had a basic lymphedema pump [id number] for some time.  This lymphedema pump has only been minimally helpful controlling her lymphedema.  I believe that a more advanced pulm will be helpful for her.  We will work on seeing about getting her Flexitouch, as I believe that will help her ongoing lymphedema.  She will also continue with conservative therapy such as medical grade compression, elevation and activity.  Addendum : 12/05/2023 The patient has engaged in conservative therapy consistently, including 20-30 mmHG medical compression socks, elevation and exercise for more than 4 weeks (approximately 5 years).Despite her strict adhearance to conservative therapy as well as consistent use of her basic pump (2 times  a day for over four weeks) she continues to have swelling with worsening hyperpigmentation, increased pain and fibrosis.  In addition, she has begun to experience truncal swelling as well despite use of conservative therapy and the basic pump.  3. Type 2 diabetes mellitus with morbid obesity (HCC) Continue hypoglycemic medications as already ordered, these medications have been reviewed and there are no changes at this time.  Hgb A1C to be monitored as already arranged by primary  service   Current Outpatient Medications on File Prior to Visit  Medication Sig Dispense Refill   acetaminophen (TYLENOL) 500 MG tablet Take 500 mg by mouth every 6 (six) hours as needed.     aspirin EC 81 MG tablet Take 81 mg by mouth 3 (three) times a week. Swallow whole. M-W-Fr     Blood Glucose Monitoring Suppl (ONETOUCH VERIO) w/Device KIT Utilize to check blood sugar twice a day, fasting in morning with goal < 130 and then 2 hours after a meal with goal <180.  Document and bring to visits. 1 kit 0   BREZTRI AEROSPHERE 160-9-4.8 MCG/ACT AERO INHALE 2 PUFFS BY MOUTH TWICE DAILY AS DIRECTED 10.7 g 11   cetirizine (ZYRTEC) 10 MG tablet Take 1 tablet (10 mg total) by mouth daily. 90 tablet 3   Cholecalciferol (VITAMIN D3) 50 MCG (2000 UT) CAPS Take 1 capsule by mouth daily.     citalopram (CELEXA) 20 MG tablet Take 1 tablet (20 mg total) by mouth as needed. (Patient taking differently: Take 20 mg by mouth as needed.) 90 tablet 4   clopidogrel (PLAVIX) 75 MG tablet Take 1 tablet (75 mg total) by mouth daily. 90 tablet 0   Crisaborole (EUCRISA) 2 % OINT Apply 1 application  topically daily. Use for itching and inflammation at left leg 100 g 2   Cyanocobalamin 1000 MCG/ML KIT Inject 1mL into the muscle once a month 3 kit 3   ezetimibe (ZETIA) 10 MG tablet Take 1 tablet (10 mg total) by mouth daily. 90 tablet 4   glucose blood (ONETOUCH VERIO) test strip UTILIIZE TO CHECK BLOOD SUGAR TWICE DAILY FASTING IN MORNING WITH GOAL<130 AND THAN 2 HOURS AFTER A MEAL WITH GGOAL<180 100 strip 3   Lancets (ONETOUCH ULTRASOFT) lancets Utilize to check blood sugar twice a day, fasting in morning with goal < 130 and then 2 hours after a meal with goal <180.  Document and bring to visits. 100 each 12   mometasone (ELOCON) 0.1 % cream Apply 1 Application topically 2 (two) times daily. As needed for flares up to two weeks only 45 g 0   pantoprazole (PROTONIX) 40 MG tablet TAKE 1 TABLET(40 MG) BY MOUTH DAILY 90 tablet  4   rosuvastatin (CRESTOR) 5 MG tablet Take 1 tablet (5 mg total) by mouth daily. 90 tablet 3   Syringe/Needle, Disp, 22G X 1-1/2" 1 ML MISC Use to inject b12 monthly 3 each 3   No current facility-administered medications on file prior to visit.    There are no Patient Instructions on file for this visit. No follow-ups on file.   Georgiana Spinner, NP

## 2023-10-05 ENCOUNTER — Ambulatory Visit (INDEPENDENT_AMBULATORY_CARE_PROVIDER_SITE_OTHER): Payer: Medicare Other | Admitting: Nurse Practitioner

## 2023-10-05 VITALS — BP 130/68 | HR 80

## 2023-10-05 DIAGNOSIS — E538 Deficiency of other specified B group vitamins: Secondary | ICD-10-CM

## 2023-10-05 MED ORDER — CYANOCOBALAMIN 1000 MCG/ML IJ SOLN
1000.0000 ug | Freq: Once | INTRAMUSCULAR | Status: AC
  Administered 2023-10-05: 1000 ug via INTRAMUSCULAR

## 2023-10-23 ENCOUNTER — Ambulatory Visit: Payer: Medicare Other | Admitting: Nurse Practitioner

## 2023-10-23 DIAGNOSIS — E1351 Other specified diabetes mellitus with diabetic peripheral angiopathy without gangrene: Secondary | ICD-10-CM

## 2023-10-23 DIAGNOSIS — I5032 Chronic diastolic (congestive) heart failure: Secondary | ICD-10-CM

## 2023-10-23 DIAGNOSIS — J432 Centrilobular emphysema: Secondary | ICD-10-CM

## 2023-10-23 DIAGNOSIS — F419 Anxiety disorder, unspecified: Secondary | ICD-10-CM

## 2023-10-23 DIAGNOSIS — G720 Drug-induced myopathy: Secondary | ICD-10-CM

## 2023-10-23 DIAGNOSIS — I7123 Aneurysm of the descending thoracic aorta, without rupture: Secondary | ICD-10-CM

## 2023-10-23 DIAGNOSIS — E6609 Other obesity due to excess calories: Secondary | ICD-10-CM

## 2023-10-23 DIAGNOSIS — D692 Other nonthrombocytopenic purpura: Secondary | ICD-10-CM

## 2023-10-23 DIAGNOSIS — I890 Lymphedema, not elsewhere classified: Secondary | ICD-10-CM

## 2023-10-23 DIAGNOSIS — E1142 Type 2 diabetes mellitus with diabetic polyneuropathy: Secondary | ICD-10-CM

## 2023-10-23 DIAGNOSIS — E1169 Type 2 diabetes mellitus with other specified complication: Secondary | ICD-10-CM

## 2023-10-27 ENCOUNTER — Ambulatory Visit: Payer: Medicare Other | Admitting: Dermatology

## 2023-11-02 ENCOUNTER — Ambulatory Visit: Payer: Medicare Other | Admitting: Nurse Practitioner

## 2023-11-02 ENCOUNTER — Encounter: Payer: Medicare Other | Admitting: Dermatology

## 2023-11-08 NOTE — Patient Instructions (Signed)

## 2023-11-13 ENCOUNTER — Ambulatory Visit (INDEPENDENT_AMBULATORY_CARE_PROVIDER_SITE_OTHER): Payer: Medicare Other | Admitting: Nurse Practitioner

## 2023-11-13 ENCOUNTER — Encounter: Payer: Self-pay | Admitting: Nurse Practitioner

## 2023-11-13 DIAGNOSIS — E1142 Type 2 diabetes mellitus with diabetic polyneuropathy: Secondary | ICD-10-CM

## 2023-11-13 DIAGNOSIS — F419 Anxiety disorder, unspecified: Secondary | ICD-10-CM

## 2023-11-13 DIAGNOSIS — J432 Centrilobular emphysema: Secondary | ICD-10-CM | POA: Diagnosis not present

## 2023-11-13 DIAGNOSIS — I7123 Aneurysm of the descending thoracic aorta, without rupture: Secondary | ICD-10-CM | POA: Diagnosis not present

## 2023-11-13 DIAGNOSIS — E1169 Type 2 diabetes mellitus with other specified complication: Secondary | ICD-10-CM | POA: Diagnosis not present

## 2023-11-13 DIAGNOSIS — Z7984 Long term (current) use of oral hypoglycemic drugs: Secondary | ICD-10-CM

## 2023-11-13 DIAGNOSIS — I700 Atherosclerosis of aorta: Secondary | ICD-10-CM

## 2023-11-13 DIAGNOSIS — E6609 Other obesity due to excess calories: Secondary | ICD-10-CM

## 2023-11-13 DIAGNOSIS — E1151 Type 2 diabetes mellitus with diabetic peripheral angiopathy without gangrene: Secondary | ICD-10-CM

## 2023-11-13 DIAGNOSIS — H9311 Tinnitus, right ear: Secondary | ICD-10-CM

## 2023-11-13 DIAGNOSIS — E1351 Other specified diabetes mellitus with diabetic peripheral angiopathy without gangrene: Secondary | ICD-10-CM

## 2023-11-13 DIAGNOSIS — D692 Other nonthrombocytopenic purpura: Secondary | ICD-10-CM

## 2023-11-13 DIAGNOSIS — I5032 Chronic diastolic (congestive) heart failure: Secondary | ICD-10-CM

## 2023-11-13 DIAGNOSIS — E785 Hyperlipidemia, unspecified: Secondary | ICD-10-CM | POA: Diagnosis not present

## 2023-11-13 DIAGNOSIS — I890 Lymphedema, not elsewhere classified: Secondary | ICD-10-CM

## 2023-11-13 LAB — BAYER DCA HB A1C WAIVED: HB A1C (BAYER DCA - WAIVED): 6.2 % — ABNORMAL HIGH (ref 4.8–5.6)

## 2023-11-13 LAB — MICROALBUMIN, URINE WAIVED
Creatinine, Urine Waived: 200 mg/dL (ref 10–300)
Microalb, Ur Waived: 30 mg/L — ABNORMAL HIGH (ref 0–19)
Microalb/Creat Ratio: <30 mg/g (ref <30)

## 2023-11-13 MED ORDER — PREDNISONE 20 MG PO TABS: 40.0000 mg | ORAL_TABLET | Freq: Every day | ORAL | 0 refills | Status: AC

## 2023-11-13 MED ORDER — FUROSEMIDE 20 MG PO TABS: 10.0000 mg | ORAL_TABLET | Freq: Every day | ORAL | 4 refills | Status: AC | PRN

## 2023-11-13 NOTE — Assessment & Plan Note (Signed)
Chronic, stable.  Continue Breztri, for triple therapy, is tolerating and Albuterol as needed.  Continue to collaborate with pulmonary as needed in future.  Continue annual lung screening until age 78.  Will continue to collaborate with [facility] pharmacist for assistance on medications.

## 2023-11-13 NOTE — Assessment & Plan Note (Signed)
Chronic, stable, only uses PRN.  Continue daily Citalopram and monitor.  Adjust dose or medication as needed, do not increase Celexa to 40 MG due to patient age >57.  Denies SI/HI. ?

## 2023-11-13 NOTE — Assessment & Plan Note (Signed)
Chronic, ongoing with lymphedema and PVD.  Continue to collaborate with vascular and current medication regimen.  A1c remains stable at 6.2% today.  Continue diet focus for diabetes control.

## 2023-11-13 NOTE — Assessment & Plan Note (Signed)
Chronic, ongoing.  Noted on CT scan 04/19/2019. Continue Zetia and Crestor for prevention.

## 2023-11-13 NOTE — Assessment & Plan Note (Signed)
Chronic, ongoing. Continue Zetia at this time as is tolerating + Rosuvastatin at low dose. Have tried multiple statins with ongoing myalgias with use.  Praluent and Zetia also caused discomfort.  Check lipid panel and CMP today with goal LDL<70.  CCM collaboration continues.

## 2023-11-13 NOTE — Assessment & Plan Note (Signed)
BMI 29.92 with T2DM, HLD, COPD.  Recommended eating smaller high protein, low fat meals more frequently and exercising 30 mins a day 5 times a week with a goal of 10-15lb weight loss in the next 3 months. Patient voiced their understanding and motivation to adhere to these recommendations.

## 2023-11-13 NOTE — Assessment & Plan Note (Signed)
Chronic, stable, remains off medication at this time -- did not tolerate Metformin in past.  Could consider trial of Farxiga in future.  A1c 6.2% today and urine ALB 12 November 2023.  At this time continue diet focus, but with history of CVA goal is to maintain A1c <6.5%.  Monitor sugars at home a few days a week and document for provider. - No ACE or ARB on board due to lower BP readings at baseline.  Statin on board. - Vaccinations up to date. - Foot exam up to date, needs eye exam.

## 2023-11-13 NOTE — Assessment & Plan Note (Signed)
On daily Plavix and ASA.  Moderate bruising bilateral upper extremity, recommend she discuss with neurology and cardiology whether she needs to continue to take both ASA and Plavix OR if she can reduce to one of these only.  Recommend cleansing skin with gentle cleanser and use of daily lotion.  Monitor for skin breakdown and address if present.

## 2023-11-13 NOTE — Assessment & Plan Note (Signed)
Followed by vascular, last CT scan showed no significant increase in size.  Due for CT scan lung yearly where this can be assessed.  Continue to collaborate with vascular.

## 2023-11-13 NOTE — Assessment & Plan Note (Signed)
Chronic, stable.  EF 55-60% on 07/03/22. Euvolemic.  Continue collaboration with cardiology.  Recommend: - Reminded to call for an overnight weight gain of >2 pounds or a weekly weight gain of >5 pounds - not adding salt to food and read food labels. Reviewed the importance of keeping daily sodium intake to 2000mg  daily. - Avoid Ibuprofen products

## 2023-11-13 NOTE — Progress Notes (Signed)
BP 116/65   Pulse 76   Temp 97.9 F (36.6 C) (Oral)   Ht 5\' 9"  (1.753 m)   LMP  (LMP Unknown)   SpO2 98%   BMI 29.92 kg/m    Subjective:    Patient ID: Sharon [name], female    DOB: 09-Aug-1946, 78 y.o.   MRN: [id number]  HPI: Sharon [name] is a 78 y.o. female  Chief Complaint  Patient presents with   Diabetes   Hyperlipidemia   Hypertension   DIABETES A1c 6.5% October. No current medications, was on Metformin which caused discomfort. Continues diet focus. Taking monthly injections for B12.   Hypoglycemic episodes:no Polydipsia/polyuria: no Visual disturbance: no Chest pain: no Paresthesias: no Glucose Monitoring: yes             Accucheck frequency: not checking             Fasting glucose:              Post prandial:             Evening:              Before meals: Taking Insulin?: no             Long acting insulin:             Short acting insulin: Blood Pressure Monitoring: not checking Retinal Examination: Not Up to Date Foot Exam: Up to Date Pneumovax: Up to Date Influenza: Up To Date Aspirin: yes  HYPERLIPIDEMIA Taking Crestor and Zetia, Plavix. Saw cardiology last 09/28/23. Hyperlipidemia status: good compliance Satisfied with current treatment?  yes Side effects:  no Medication compliance: good compliance Past cholesterol meds: multiple Supplements: none Aspirin:  yes The ASCVD Risk score (Arnett DK, et al., 2019) failed to calculate for the following reasons:   Risk score cannot be calculated because patient has a medical history suggesting prior/existing ASCVD Chest pain:  no Coronary artery disease:  no Family history CAD:  yes Family history early CAD:  no    COPD Using Breztri daily and Albuterol PRN.  In past took [employer] and Symbicort.   Lung screening last 08/03/23 noting mild centrilobular emphysema and aortic atherosclerosis.  COPD status: stable Satisfied with current treatment?: yes Oxygen use: no Dyspnea frequency:  occasional Cough frequency: occasional Rescue inhaler frequency: occasional Limitation of activity: no Productive cough: none Last Spirometry: unknown Pneumovax: Up to Date Influenza: Up to Date    CHRONIC KIDNEY DISEASE CKD status: stable Medications renally dose: yes Previous renal evaluation: no Pneumovax:  Up to Date Influenza Vaccine:  Up to Date   LYMPHEDEMA WITH CHRONIC VENOUS INSUFFICIENCY: Takes Tylenol for pain, which benefits at times.  Continues to use lymphedema pumps at home. Dermatology last 07/21/23.  Has been treated for cellulitis in past multiple times, last 06/17/23.  Last saw vascular on 09/25/23.  Has abdominal aortic aneurysm followed with them, last measured 4 cm on imaging 07/25/23. Duration: months Pain: yes Quality: dull, aching, and throbbing Frequency: intermittent Context:  fluctuating Decreased function/range of motion: at times Erythema: none Swelling: yes, stable Heat or warmth: none Morning stiffness: yes unsure how long this lasts  DEPRESSION Continues on Celexa 20 MG as needed for mood, has been using this PRN for years for while driving -- started by previous PCP. Mood status: stable Satisfied with current treatment?: yes Symptom severity: mild  Duration of current treatment : chronic Side effects: no Medication compliance: good compliance Psychotherapy/counseling: none Depressed mood: occasional  Anxious mood: occasional Anhedonia: no Significant weight loss or gain: no Insomnia: no, gets 6 hours sleep Fatigue: no Feelings of worthlessness or guilt: worthlessness sometimes Impaired concentration/indecisiveness: no Suicidal ideations: no Hopelessness: no Crying spells: occasional    11/13/2023    3:03 PM 07/22/2023    1:32 PM 04/21/2023    4:37 PM 10/07/2022    1:11 PM 06/03/2022    9:24 AM  Depression screen PHQ 2/9  Decreased Interest 0 0 1 0 0  Down, Depressed, Hopeless 1 0 1 0 0  PHQ - 2 Score 1 0 2 0 0  Altered sleeping 0 0  0 1   Tired, decreased energy 1 1 0 1   Change in appetite 0 0 0 0   Feeling bad or failure about yourself  1 0 0 0   Trouble concentrating 0 0 0 0   Moving slowly or fidgety/restless 0 0 0 1   Suicidal thoughts 0 0 0 0   PHQ-9 Score 3 1 2 3    Difficult doing work/chores  Not difficult at all  Not difficult at all        11/13/2023    3:05 PM 07/22/2023    1:32 PM 04/21/2023    4:37 PM 01/15/2021    1:37 PM  GAD 7 : Generalized Anxiety Score  Nervous, Anxious, on Edge 1 1 1 3   Control/stop worrying 0 0 1 3  Worry too much - different things 1 0 1 3  Trouble relaxing 0 0 0 0  Restless 0 0 0 0  Easily annoyed or irritable 1 0 0 1  Afraid - awful might happen 2 0 1 0  Total GAD 7 Score 5 1 4 10   Anxiety Difficulty Somewhat difficult Not difficult at all Not difficult at all    Relevant past medical, surgical, family and social history reviewed and updated as indicated. Interim medical history since our last visit reviewed. Allergies and medications reviewed and updated.  Review of Systems  Constitutional:  Negative for activity change, appetite change, diaphoresis, fatigue and fever.  HENT:  Positive for tinnitus.   Respiratory:  Negative for cough, chest tightness and shortness of breath.   Cardiovascular:  Negative for chest pain, palpitations and leg swelling.  Gastrointestinal: Negative.   Neurological: Negative.   Psychiatric/Behavioral: Negative.        Objective:    BP 116/65   Pulse 76   Temp 97.9 F (36.6 C) (Oral)   Ht 5\' 9"  (1.753 m)   LMP  (LMP Unknown)   SpO2 98%   BMI 29.92 kg/m   Wt Readings from Last 3 Encounters:  09/28/23 202 lb 9.6 oz (91.9 kg)  09/25/23 200 lb (90.7 kg)  08/03/23 198 lb (89.8 kg)    Physical Exam Vitals and nursing note reviewed.  Constitutional:      General: She is awake. She is not in acute distress.    Appearance: She is well-developed and well-groomed. She is obese. She is not ill-appearing or toxic-appearing.  HENT:      Head: Normocephalic.     Right Ear: Hearing, tympanic membrane, ear canal and external ear normal. No drainage. No middle ear effusion. There is no impacted cerumen. Tympanic membrane is not injected.     Left Ear: Hearing, tympanic membrane, ear canal and external ear normal. No drainage.  No middle ear effusion. There is no impacted cerumen. Tympanic membrane is not injected.     Nose: Nose normal.  Mouth/Throat:     Mouth: Mucous membranes are moist.  Eyes:     General: Lids are normal.        Right eye: No discharge.        Left eye: No discharge.     Conjunctiva/sclera: Conjunctivae normal.  Neck:     Thyroid: No thyromegaly.     Vascular: No carotid bruit.  Cardiovascular:     Rate and Rhythm: Normal rate and regular rhythm.     Heart sounds: No murmur heard.    No gallop.  Pulmonary:     Effort: Pulmonary effort is normal. No accessory muscle usage or respiratory distress.     Breath sounds: Normal breath sounds. No decreased breath sounds, wheezing or rhonchi.  Musculoskeletal:     Cervical back: Normal range of motion.     Right lower leg: 1+ Edema present.     Left lower leg: 1+ Edema present.  Lymphadenopathy:     Cervical: No cervical adenopathy.  Skin:    Findings: Bruising (moderate noted to upper extremities.) present.  Neurological:     Mental Status: She is alert and oriented to person, place, and time.  Psychiatric:        Attention and Perception: Attention normal.        Mood and Affect: Mood normal.        Speech: Speech normal.        Behavior: Behavior normal. Behavior is cooperative.        Thought Content: Thought content normal.    Results for orders placed or performed in visit on 11/13/23  Bayer DCA Hb A1c Waived   Collection Time: 11/13/23  2:38 PM  Result Value Ref Range   HB A1C (BAYER DCA - WAIVED) 6.2 (H) 4.8 - 5.6 %  Microalbumin, Urine Waived   Collection Time: 11/13/23  2:38 PM  Result Value Ref Range   Microalb, Ur Waived 30 (H) 0  - 19 mg/L   Creatinine, Urine Waived 200 10 - 300 mg/dL   Microalb/Creat Ratio <30 <30 mg/g   *Note: Due to a large number of results and/or encounters for the requested time period, some results have not been displayed. A complete set of results can be found in Results Review.      Assessment & Plan:   Problem List Items Addressed This Visit       Cardiovascular and Mediastinum   Aneurysm of descending thoracic aorta (HCC)   Followed by vascular, last CT scan showed no significant increase in size.  Due for CT scan lung yearly where this can be assessed.  Continue to collaborate with vascular.      Relevant Medications   furosemide (LASIX) 20 MG tablet   Aortic atherosclerosis (HCC)   Chronic, ongoing.  Noted on CT scan 04/19/2019. Continue Zetia and Crestor for prevention.      Relevant Medications   furosemide (LASIX) 20 MG tablet   Chronic diastolic CHF (congestive heart failure) (HCC)   Chronic, stable.  EF 55-60% on 07/03/22. Euvolemic.  Continue collaboration with cardiology.  Recommend: - Reminded to call for an overnight weight gain of >2 pounds or a weekly weight gain of >5 pounds - not adding salt to food and read food labels. Reviewed the importance of keeping daily sodium intake to 2000mg  daily. - Avoid Ibuprofen products      Relevant Medications   furosemide (LASIX) 20 MG tablet   Peripheral vascular disease due to secondary diabetes (HCC)   Chronic,  ongoing with lymphedema and PVD.  Continue to collaborate with vascular and current medication regimen.  A1c remains stable at 6.2% today.  Continue diet focus for diabetes control.      Relevant Medications   furosemide (LASIX) 20 MG tablet   Senile purpura (HCC)   On daily Plavix and ASA.  Moderate bruising bilateral upper extremity, recommend she discuss with neurology and cardiology whether she needs to continue to take both ASA and Plavix OR if she can reduce to one of these only.  Recommend cleansing skin with  gentle cleanser and use of daily lotion.  Monitor for skin breakdown and address if present.      Relevant Medications   furosemide (LASIX) 20 MG tablet     Respiratory   Centrilobular emphysema (HCC)   Chronic, stable.  Continue Breztri, for triple therapy, is tolerating and Albuterol as needed.  Continue to collaborate with pulmonary as needed in future.  Continue annual lung screening until age 39.  Will continue to collaborate with [facility] pharmacist for assistance on medications.      Relevant Medications   predniSONE (DELTASONE) 20 MG tablet   Other Relevant Orders   CBC with Differential/Platelet     Endocrine   Hyperlipidemia associated with type 2 diabetes mellitus (HCC)   Chronic, ongoing. Continue Zetia at this time as is tolerating + Rosuvastatin at low dose. Have tried multiple statins with ongoing myalgias with use.  Praluent and Zetia also caused discomfort.  Check lipid panel and CMP today with goal LDL<70.  CCM collaboration continues.        Relevant Medications   furosemide (LASIX) 20 MG tablet   Other Relevant Orders   Bayer DCA Hb A1c Waived (Completed)   Comprehensive metabolic panel   Lipid Panel w/o Chol/HDL Ratio   Peripheral sensory neuropathy due to type 2 diabetes mellitus (HCC)   Chronic, ongoing.  A1c 6.2% today and urine ALB 12 November 2023. Currently no medications for this.  Refer to morbid obesity plan of care for further.      Relevant Orders   Bayer DCA Hb A1c Waived (Completed)   Microalbumin, Urine Waived (Completed)   CBC with Differential/Platelet   Type 2 diabetes mellitus with morbid obesity (HCC) - Primary   Chronic, stable, remains off medication at this time -- did not tolerate Metformin in past.  Could consider trial of Farxiga in future.  A1c 6.2% today and urine ALB 12 November 2023.  At this time continue diet focus, but with history of CVA goal is to maintain A1c <6.5%.  Monitor sugars at home a few days a week and document for  provider. - No ACE or ARB on board due to lower BP readings at baseline.  Statin on board. - Vaccinations up to date. - Foot exam up to date, needs eye exam.      Relevant Orders   Bayer DCA Hb A1c Waived (Completed)   Microalbumin, Urine Waived (Completed)     Other   Anxiety   Chronic, stable, only uses PRN.  Continue daily Citalopram and monitor.  Adjust dose or medication as needed, do not increase Celexa to 40 MG due to patient age >28.  Denies SI/HI.      Obesity   BMI 29.92 with T2DM, HLD, COPD.  Recommended eating smaller high protein, low fat meals more frequently and exercising 30 mins a day 5 times a week with a goal of 10-15lb weight loss in the next 3 months. Patient voiced  their understanding and motivation to adhere to these recommendations.       Other Visit Diagnoses       Objective tinnitus of right ear       Referral to ENT.   Relevant Orders   Ambulatory referral to ENT        Follow up plan: Return in about 6 months (around 05/12/2024) for T2DM, HLD, MOOD, PVD.

## 2023-11-13 NOTE — Assessment & Plan Note (Signed)
Chronic, ongoing.  A1c 6.2% today and urine ALB 12 November 2023. Currently no medications for this.  Refer to morbid obesity plan of care for further.

## 2023-11-14 LAB — LIPID PANEL W/O CHOL/HDL RATIO
Cholesterol, Total: 124 mg/dL (ref 100–199)
HDL: 63 mg/dL (ref >39)
LDL Chol Calc (NIH): 42 mg/dL (ref 0–99)
Triglycerides: 106 mg/dL (ref 0–149)
VLDL Cholesterol Cal: 19 mg/dL (ref 5–40)

## 2023-11-14 LAB — CBC WITH DIFFERENTIAL/PLATELET
Basophils Absolute: 0 10*3/uL (ref 0.0–0.2)
Basos: 0 %
EOS (ABSOLUTE): 0 10*3/uL (ref 0.0–0.4)
Eos: 1 %
Hematocrit: 41.3 % (ref 34.0–46.6)
Hemoglobin: 13.8 g/dL (ref 11.1–15.9)
Immature Grans (Abs): 0 10*3/uL (ref 0.0–0.1)
Immature Granulocytes: 0 %
Lymphocytes Absolute: 1.4 10*3/uL (ref 0.7–3.1)
Lymphs: 21 %
MCH: 30.7 pg (ref 26.6–33.0)
MCHC: 33.4 g/dL (ref 31.5–35.7)
MCV: 92 fL (ref 79–97)
Monocytes Absolute: 0.5 10*3/uL (ref 0.1–0.9)
Monocytes: 7 %
Neutrophils Absolute: 4.7 10*3/uL (ref 1.4–7.0)
Neutrophils: 71 %
Platelets: 191 10*3/uL (ref 150–450)
RBC: 4.49 x10E6/uL (ref 3.77–5.28)
RDW: 12.1 % (ref 11.7–15.4)
WBC: 6.7 10*3/uL (ref 3.4–10.8)

## 2023-11-14 LAB — COMPREHENSIVE METABOLIC PANEL
ALT: 15 [IU]/L (ref 0–32)
AST: 17 [IU]/L (ref 0–40)
Albumin: 4.1 g/dL (ref 3.8–4.8)
Alkaline Phosphatase: 91 [IU]/L (ref 44–121)
BUN/Creatinine Ratio: 15 (ref 12–28)
BUN: 14 mg/dL (ref 8–27)
Bilirubin Total: 0.4 mg/dL (ref 0.0–1.2)
CO2: 23 mmol/L (ref 20–29)
Calcium: 9.1 mg/dL (ref 8.7–10.3)
Chloride: 105 mmol/L (ref 96–106)
Creatinine, Ser: 0.95 mg/dL (ref 0.57–1.00)
Globulin, Total: 2.1 g/dL (ref 1.5–4.5)
Glucose: 185 mg/dL — ABNORMAL HIGH (ref 70–99)
Potassium: 4.3 mmol/L (ref 3.5–5.2)
Sodium: 141 mmol/L (ref 134–144)
Total Protein: 6.2 g/dL (ref 6.0–8.5)
eGFR: 62 mL/min/{1.73_m2} (ref >59)

## 2023-11-15 ENCOUNTER — Encounter: Payer: Self-pay | Admitting: Nurse Practitioner

## 2023-11-15 NOTE — Progress Notes (Signed)
Contacted via MyChart   Good morning [name], your labs have returned and overall remain stable.  No changes needed.  Great job!! Any questions? Keep being amazing!!  Thank you for allowing me to participate in your care.  I appreciate you. Kindest regards, [name]

## 2023-11-17 DIAGNOSIS — R531 Weakness: Secondary | ICD-10-CM | POA: Diagnosis not present

## 2023-11-17 DIAGNOSIS — E1169 Type 2 diabetes mellitus with other specified complication: Secondary | ICD-10-CM | POA: Diagnosis not present

## 2023-11-17 DIAGNOSIS — R1310 Dysphagia, unspecified: Secondary | ICD-10-CM | POA: Diagnosis not present

## 2023-11-17 DIAGNOSIS — I639 Cerebral infarction, unspecified: Secondary | ICD-10-CM | POA: Diagnosis not present

## 2023-11-17 DIAGNOSIS — R202 Paresthesia of skin: Secondary | ICD-10-CM | POA: Diagnosis not present

## 2023-11-17 DIAGNOSIS — R42 Dizziness and giddiness: Secondary | ICD-10-CM | POA: Diagnosis not present

## 2023-11-17 DIAGNOSIS — R4781 Slurred speech: Secondary | ICD-10-CM | POA: Diagnosis not present

## 2023-11-17 DIAGNOSIS — R200 Anesthesia of skin: Secondary | ICD-10-CM | POA: Diagnosis not present

## 2023-11-20 ENCOUNTER — Telehealth: Payer: Medicare Other | Admitting: Nurse Practitioner

## 2023-11-20 ENCOUNTER — Encounter: Payer: Self-pay | Admitting: Nurse Practitioner

## 2023-11-20 VITALS — Temp 97.9°F

## 2023-11-20 DIAGNOSIS — Z20828 Contact with and (suspected) exposure to other viral communicable diseases: Secondary | ICD-10-CM | POA: Insufficient documentation

## 2023-11-20 MED ORDER — OSELTAMIVIR PHOSPHATE 75 MG PO CAPS: 75.0000 mg | ORAL_CAPSULE | Freq: Two times a day (BID) | ORAL | 0 refills | Status: AC

## 2023-11-20 MED ORDER — AMOXICILLIN-POT CLAVULANATE 875-125 MG PO TABS
1.0000 | ORAL_TABLET | Freq: Two times a day (BID) | ORAL | 0 refills | Status: DC
Start: 2023-11-20 — End: 2024-01-29

## 2023-11-20 NOTE — Assessment & Plan Note (Signed)
 Acute symptoms starting <48 hours ago.  Since had direct exposure and is symptomatic will treat with Tamiflu  BID for 5 days, recent CrCl 72.  Discussed at length with patient and educated her.  Augmentin  sent in if cough ongoing next week, Tuesday, as she is higher risk for PNA development.  Recommend: - Increased rest - Increasing Fluids - Acetaminophen  as needed for fever/pain.  - Salt water  gargling, chloraseptic spray and throat lozenges - Mucinex .  - Humidifying the air.

## 2023-11-20 NOTE — Progress Notes (Signed)
 Temp 97.9 F (36.6 C) (Oral) Comment: taking Tylenol   LMP  (LMP Unknown)    Subjective:    Patient ID: Sharon [name], female    DOB: 09-24-1946, 78 y.o.   MRN: [id number]  HPI: Sharon [name] is a 78 y.o. female  Chief Complaint  Patient presents with   Influenza   Virtual Visit via Video Note  I connected with Sharon [name] on 11/20/23 at  1:40 PM EST by a video enabled telemedicine application and verified that I am speaking with the correct person using two identifiers.  Location: Patient: home Provider: work   I discussed the limitations of evaluation and management by telemedicine and the availability of in person appointments. The patient expressed understanding and agreed to proceed.  I discussed the assessment and treatment plan with the patient. The patient was provided an opportunity to ask questions and all were answered. The patient agreed with the plan and demonstrated an understanding of the instructions.   The patient was advised to call back or seek an in-person evaluation if the symptoms worsen or if the condition fails to improve as anticipated.  I provided 25 minutes of non-face-to-face time during this encounter.   Jatara Huettner T Milana Salay, NP   UPPER RESPIRATORY TRACT INFECTION Was exposed to flu after home care aide came into home, she had symptoms and her so was diagnosed with flu.  Patient started with symptoms Wednesday night. Fever: no Cough: yes Shortness of breath: no Wheezing: no Chest pain: no Chest tightness: yes Chest congestion: no Nasal congestion: yes Runny nose: yes Post nasal drip: yes Sneezing: no Sore throat: yes Swollen glands: no Sinus pressure:yes Headache: yes Face pain: no Toothache: no Ear pain: yes bilateral Ear pressure: yes bilateral Eyes red/itching:no Eye drainage/crusting: no  Vomiting: no Rash: no Fatigue: yes Sick contacts: yes Strep contacts: no  Context: fluctuating Recurrent sinusitis: no Relief with OTC  cold/cough medications: no  Treatments attempted: cold/sinus    Relevant past medical, surgical, family and social history reviewed and updated as indicated. Interim medical history since our last visit reviewed. Allergies and medications reviewed and updated.  Review of Systems  Constitutional:  Positive for fatigue. Negative for activity change, appetite change, chills and fever.  HENT:  Positive for congestion, ear pain, postnasal drip, rhinorrhea, sinus pressure, sinus pain and sore throat. Negative for ear discharge, facial swelling, sneezing and voice change.   Eyes:  Negative for pain and visual disturbance.  Respiratory:  Positive for cough and chest tightness. Negative for shortness of breath and wheezing.   Cardiovascular:  Negative for chest pain, palpitations and leg swelling.  Gastrointestinal: Negative.   Endocrine: Negative.   Neurological:  Positive for headaches. Negative for dizziness and numbness.  Psychiatric/Behavioral: Negative.      Per HPI unless specifically indicated above     Objective:    Temp 97.9 F (36.6 C) (Oral) Comment: taking Tylenol   LMP  (LMP Unknown)   Wt Readings from Last 3 Encounters:  09/28/23 202 lb 9.6 oz (91.9 kg)  09/25/23 200 lb (90.7 kg)  08/03/23 198 lb (89.8 kg)    Physical Exam Vitals and nursing note reviewed.  Constitutional:      General: She is awake. She is not in acute distress.    Appearance: She is well-developed. She is ill-appearing. She is not toxic-appearing.  HENT:     Head: Normocephalic.     Right Ear: Hearing normal.     Left Ear: Hearing normal.  Eyes:     General: Lids are normal.        Right eye: No discharge.        Left eye: No discharge.     Conjunctiva/sclera: Conjunctivae normal.  Pulmonary:     Effort: Pulmonary effort is normal. No accessory muscle usage or respiratory distress.  Musculoskeletal:     Cervical back: Normal range of motion.  Neurological:     Mental Status: She is alert and  oriented to person, place, and time.  Psychiatric:        Attention and Perception: Attention normal.        Mood and Affect: Mood normal.        Behavior: Behavior normal. Behavior is cooperative.        Thought Content: Thought content normal.        Judgment: Judgment normal.    Results for orders placed or performed in visit on 11/13/23  Bayer DCA Hb A1c Waived   Collection Time: 11/13/23  2:38 PM  Result Value Ref Range   HB A1C (BAYER DCA - WAIVED) 6.2 (H) 4.8 - 5.6 %  Microalbumin, Urine Waived   Collection Time: 11/13/23  2:38 PM  Result Value Ref Range   Microalb, Ur Waived 30 (H) 0 - 19 mg/L   Creatinine, Urine Waived 200 10 - 300 mg/dL   Microalb/Creat Ratio <30 <30 mg/g  CBC with Differential/Platelet   Collection Time: 11/13/23  2:39 PM  Result Value Ref Range   WBC 6.7 3.4 - 10.8 x10E3/uL   RBC 4.49 3.77 - 5.28 x10E6/uL   Hemoglobin 13.8 11.1 - 15.9 g/dL   Hematocrit 58.6 65.9 - 46.6 %   MCV 92 79 - 97 fL   MCH 30.7 26.6 - 33.0 pg   MCHC 33.4 31.5 - 35.7 g/dL   RDW 87.8 88.2 - 84.5 %   Platelets 191 150 - 450 x10E3/uL   Neutrophils 71 Not Estab. %   Lymphs 21 Not Estab. %   Monocytes 7 Not Estab. %   Eos 1 Not Estab. %   Basos 0 Not Estab. %   Neutrophils Absolute 4.7 1.4 - 7.0 x10E3/uL   Lymphocytes Absolute 1.4 0.7 - 3.1 x10E3/uL   Monocytes Absolute 0.5 0.1 - 0.9 x10E3/uL   EOS (ABSOLUTE) 0.0 0.0 - 0.4 x10E3/uL   Basophils Absolute 0.0 0.0 - 0.2 x10E3/uL   Immature Granulocytes 0 Not Estab. %   Immature Grans (Abs) 0.0 0.0 - 0.1 x10E3/uL  Comprehensive metabolic panel   Collection Time: 11/13/23  2:39 PM  Result Value Ref Range   Glucose 185 (H) 70 - 99 mg/dL   BUN 14 8 - 27 mg/dL   Creatinine, Ser 9.04 0.57 - 1.00 mg/dL   eGFR 62 >40 fO/fpw/8.26   BUN/Creatinine Ratio 15 12 - 28   Sodium 141 134 - 144 mmol/L   Potassium 4.3 3.5 - 5.2 mmol/L   Chloride 105 96 - 106 mmol/L   CO2 23 20 - 29 mmol/L   Calcium  9.1 8.7 - 10.3 mg/dL   Total Protein  6.2 6.0 - 8.5 g/dL   Albumin 4.1 3.8 - 4.8 g/dL   Globulin, Total 2.1 1.5 - 4.5 g/dL   Bilirubin Total 0.4 0.0 - 1.2 mg/dL   Alkaline Phosphatase 91 44 - 121 IU/L   AST 17 0 - 40 IU/L   ALT 15 0 - 32 IU/L  Lipid Panel w/o Chol/HDL Ratio   Collection Time: 11/13/23  2:39 PM  Result  Value Ref Range   Cholesterol, Total 124 100 - 199 mg/dL   Triglycerides 893 0 - 149 mg/dL   HDL 63 >60 mg/dL   VLDL Cholesterol Cal 19 5 - 40 mg/dL   LDL Chol Calc (NIH) 42 0 - 99 mg/dL   *Note: Due to a large number of results and/or encounters for the requested time period, some results have not been displayed. A complete set of results can be found in Results Review.      Assessment & Plan:   Problem List Items Addressed This Visit       Other   Exposure to the flu - Primary   Acute symptoms starting <48 hours ago.  Since had direct exposure and is symptomatic will treat with Tamiflu  BID for 5 days, recent CrCl 72.  Discussed at length with patient and educated her.  Augmentin  sent in if cough ongoing next week, Tuesday, as she is higher risk for PNA development.  Recommend: - Increased rest - Increasing Fluids - Acetaminophen  as needed for fever/pain.  - Salt water  gargling, chloraseptic spray and throat lozenges - Mucinex .  - Humidifying the air.         Follow up plan: Return if symptoms worsen or fail to improve.

## 2023-11-20 NOTE — Patient Instructions (Signed)

## 2023-12-02 MED ORDER — PREDNISONE 20 MG PO TABS: 40.0000 mg | ORAL_TABLET | Freq: Every day | ORAL | 0 refills | Status: AC

## 2023-12-10 DIAGNOSIS — H6121 Impacted cerumen, right ear: Secondary | ICD-10-CM | POA: Diagnosis not present

## 2023-12-10 DIAGNOSIS — M26641 Arthritis of right temporomandibular joint: Secondary | ICD-10-CM | POA: Diagnosis not present

## 2023-12-10 DIAGNOSIS — R42 Dizziness and giddiness: Secondary | ICD-10-CM | POA: Diagnosis not present

## 2023-12-10 DIAGNOSIS — R490 Dysphonia: Secondary | ICD-10-CM | POA: Diagnosis not present

## 2023-12-11 DIAGNOSIS — I890 Lymphedema, not elsewhere classified: Secondary | ICD-10-CM | POA: Diagnosis not present

## 2024-01-08 ENCOUNTER — Encounter: Payer: Self-pay | Admitting: Nurse Practitioner

## 2024-01-24 ENCOUNTER — Other Ambulatory Visit: Payer: Self-pay | Admitting: Nurse Practitioner

## 2024-01-26 NOTE — Telephone Encounter (Signed)
 Requested Prescriptions  Pending Prescriptions Disp Refills   albuterol (VENTOLIN HFA) 108 (90 Base) MCG/ACT inhaler [Pharmacy Med Name: ALBUTEROL HFA INH (200 PUFFS) 8.5GM] 8.5 g 3    Sig: INHALE 2 PUFFS BY MOUTH EVERY 6 HOURS AS NEEDED FOR WHEEZING OR SHORTNESS OF BREATH     Pulmonology:  Beta [facility]onists 2 Passed - 01/26/2024  8:44 AM      Passed - Last BP in normal range    BP Readings from Last 1 Encounters:  11/13/23 116/65         Passed - Last Heart Rate in normal range    Pulse Readings from Last 1 Encounters:  11/13/23 76         Passed - Valid encounter within last 12 months    Recent Outpatient Visits           2 months ago Exposure to the flu   [facility] [place], Lavelle Posey, NP       Future Appointments             In 3 months [name], Jolene T, NP  [facility]

## 2024-01-29 ENCOUNTER — Encounter: Payer: Self-pay | Admitting: Nurse Practitioner

## 2024-01-29 ENCOUNTER — Ambulatory Visit (INDEPENDENT_AMBULATORY_CARE_PROVIDER_SITE_OTHER): Admitting: Nurse Practitioner

## 2024-01-29 VITALS — BP 114/66 | HR 81 | Temp 97.6°F | Ht 69.0 in | Wt 202.0 lb

## 2024-01-29 DIAGNOSIS — H00022 Hordeolum internum right lower eyelid: Secondary | ICD-10-CM | POA: Insufficient documentation

## 2024-01-29 DIAGNOSIS — E538 Deficiency of other specified B group vitamins: Secondary | ICD-10-CM

## 2024-01-29 DIAGNOSIS — J432 Centrilobular emphysema: Secondary | ICD-10-CM

## 2024-01-29 MED ORDER — CYANOCOBALAMIN 1000 MCG/ML IJ SOLN
1000.0000 ug | Freq: Once | INTRAMUSCULAR | Status: AC
  Administered 2024-01-29: 1000 ug via INTRAMUSCULAR

## 2024-01-29 MED ORDER — AMOXICILLIN-POT CLAVULANATE 875-125 MG PO TABS: 1.0000 | ORAL_TABLET | Freq: Two times a day (BID) | ORAL | 0 refills | Status: DC

## 2024-01-29 MED ORDER — PREDNISONE 20 MG PO TABS: 40.0000 mg | ORAL_TABLET | Freq: Every day | ORAL | 0 refills | Status: AC

## 2024-01-29 MED ORDER — ERYTHROMYCIN 5 MG/GM OP OINT: 1.0000 | TOPICAL_OINTMENT | Freq: Every day | OPHTHALMIC | 0 refills | Status: DC

## 2024-01-29 NOTE — Progress Notes (Signed)
 BP 114/66   Pulse 81   Temp 97.6 F (36.4 C) (Oral)   Ht 5\' 9"  (1.753 m)   Wt 202 lb (91.6 kg)   LMP  (LMP Unknown)   SpO2 96%   BMI 29.83 kg/m    Subjective:    Patient ID: Sharon [name], female    DOB: [birth year], 78 y.o.   MRN: [id number]  HPI: Sharon [name] is a 78 y.o. female  Chief Complaint  Patient presents with   Eye Problem   EYE ISSUES This week started to have redness to eye area, under right eye. She has started coughing as well, her caregiver came to house sick this week.  Has underlying COPD.  Coughing up green phlegm. Duration:  days Involved eye:  right Onset: sudden Foreign body sensation:yes Visual impairment: no Eye redness: yes Discharge: no Crusting or matting of eyelids: no Swelling: yes Photophobia: no Itching: yes Tearing: yes Headache: no Floaters: no URI symptoms: yes Contact lens use: no Close contacts with similar problems: no Eye trauma: no Aggravating factors: unknown Alleviating factors: stye treatment from over the counter Status: stable Treatments attempted: Stye treatment over the counter  Relevant past medical, surgical, family and social history reviewed and updated as indicated. Interim medical history since our last visit reviewed. Allergies and medications reviewed and updated.  Review of Systems  Constitutional:  Positive for fatigue. Negative for activity change, appetite change, chills and fever.  HENT:  Positive for congestion, postnasal drip, rhinorrhea and sinus pressure. Negative for ear discharge, ear pain, facial swelling, sinus pain, sneezing, sore throat and voice change.   Eyes:  Positive for redness and itching. Negative for pain, discharge and visual disturbance.  Respiratory:  Positive for cough, chest tightness and wheezing. Negative for shortness of breath.   Cardiovascular:  Negative for chest pain, palpitations and leg swelling.  Endocrine: Negative.   Neurological:  Negative for dizziness, numbness  and headaches.  Psychiatric/Behavioral: Negative.     Per HPI unless specifically indicated above     Objective:    BP 114/66   Pulse 81   Temp 97.6 F (36.4 C) (Oral)   Ht 5\' 9"  (1.753 m)   Wt 202 lb (91.6 kg)   LMP  (LMP Unknown)   SpO2 96%   BMI 29.83 kg/m   Wt Readings from Last 3 Encounters:  01/29/24 202 lb (91.6 kg)  09/28/23 202 lb 9.6 oz (91.9 kg)  09/25/23 200 lb (90.7 kg)    Physical Exam Vitals and nursing note reviewed.  Constitutional:      General: She is awake. She is not in acute distress.    Appearance: She is well-developed and well-groomed. She is not ill-appearing or toxic-appearing.  HENT:     Head: Normocephalic.     Right Ear: Hearing, tympanic membrane, ear canal and external ear normal.     Left Ear: Hearing, tympanic membrane, ear canal and external ear normal.     Nose: Nose normal. No rhinorrhea.     Mouth/Throat:     Mouth: Mucous membranes are moist.  Eyes:     General: Lids are normal.        Right eye: Hordeolum (to inner lower lid lateral aspect with mild edema externally) present. No foreign body or discharge.        Left eye: No foreign body, discharge or hordeolum.     Extraocular Movements: Extraocular movements intact.     Conjunctiva/sclera:     Right eye:  Right conjunctiva is injected. No exudate.    Left eye: Left conjunctiva is not injected. No exudate.    Pupils: Pupils are equal, round, and reactive to light.  Neck:     Thyroid : No thyromegaly.     Vascular: No carotid bruit.  Cardiovascular:     Rate and Rhythm: Normal rate and regular rhythm.     Heart sounds: Normal heart sounds. No murmur heard.    No gallop.  Pulmonary:     Effort: Pulmonary effort is normal. No accessory muscle usage or respiratory distress.     Breath sounds: Wheezing present. No decreased breath sounds or rales.     Comments: Intermittent wheezes noted throughout and intermittent productive cough. Abdominal:     General: Bowel sounds are  normal. There is no distension.     Palpations: Abdomen is soft.     Tenderness: There is no abdominal tenderness.  Musculoskeletal:     Cervical back: Normal range of motion and neck supple.     Right lower leg: No edema.     Left lower leg: No edema.  Lymphadenopathy:     Cervical: No cervical adenopathy.  Skin:    General: Skin is warm and dry.  Neurological:     Mental Status: She is alert and oriented to person, place, and time.     Deep Tendon Reflexes: Reflexes are normal and symmetric.     Reflex Scores:      Brachioradialis reflexes are 2+ on the right side and 2+ on the left side.      Patellar reflexes are 2+ on the right side and 2+ on the left side. Psychiatric:        Attention and Perception: Attention normal.        Mood and Affect: Mood normal.        Speech: Speech normal.        Behavior: Behavior normal. Behavior is cooperative.        Thought Content: Thought content normal.     Results for orders placed or performed in visit on 11/13/23  Bayer DCA Hb A1c Waived   Collection Time: 11/13/23  2:38 PM  Result Value Ref Range   HB A1C (BAYER DCA - WAIVED) 6.2 (H) 4.8 - 5.6 %  Microalbumin, Urine Waived   Collection Time: 11/13/23  2:38 PM  Result Value Ref Range   Microalb, Ur Waived 30 (H) 0 - 19 mg/L   Creatinine, Urine Waived 200 10 - 300 mg/dL   Microalb/Creat Ratio <30 <30 mg/g  CBC with Differential/Platelet   Collection Time: 11/13/23  2:39 PM  Result Value Ref Range   WBC 6.7 3.4 - 10.8 x10E3/uL   RBC 4.49 3.77 - 5.28 x10E6/uL   Hemoglobin 13.8 11.1 - 15.9 g/dL   Hematocrit 81.1 91.4 - 46.6 %   MCV 92 79 - 97 fL   MCH 30.7 26.6 - 33.0 pg   MCHC 33.4 31.5 - 35.7 g/dL   RDW 78.2 95.6 - 21.3 %   Platelets 191 150 - 450 x10E3/uL   Neutrophils 71 Not Estab. %   Lymphs 21 Not Estab. %   Monocytes 7 Not Estab. %   Eos 1 Not Estab. %   Basos 0 Not Estab. %   Neutrophils Absolute 4.7 1.4 - 7.0 x10E3/uL   Lymphocytes Absolute 1.4 0.7 - 3.1  x10E3/uL   Monocytes Absolute 0.5 0.1 - 0.9 x10E3/uL   EOS (ABSOLUTE) 0.0 0.0 - 0.4 x10E3/uL   Basophils  Absolute 0.0 0.0 - 0.2 x10E3/uL   Immature Granulocytes 0 Not Estab. %   Immature Grans (Abs) 0.0 0.0 - 0.1 x10E3/uL  Comprehensive metabolic panel   Collection Time: 11/13/23  2:39 PM  Result Value Ref Range   Glucose 185 (H) 70 - 99 mg/dL   BUN 14 8 - 27 mg/dL   Creatinine, Ser 1.61 0.57 - 1.00 mg/dL   eGFR 62 >09 UE/AVW/0.98   BUN/Creatinine Ratio 15 12 - 28   Sodium 141 134 - 144 mmol/L   Potassium 4.3 3.5 - 5.2 mmol/L   Chloride 105 96 - 106 mmol/L   CO2 23 20 - 29 mmol/L   Calcium  9.1 8.7 - 10.3 mg/dL   Total Protein 6.2 6.0 - 8.5 g/dL   Albumin 4.1 3.8 - 4.8 g/dL   Globulin, Total 2.1 1.5 - 4.5 g/dL   Bilirubin Total 0.4 0.0 - 1.2 mg/dL   Alkaline Phosphatase 91 44 - 121 IU/L   AST 17 0 - 40 IU/L   ALT 15 0 - 32 IU/L  Lipid Panel w/o Chol/HDL Ratio   Collection Time: 11/13/23  2:39 PM  Result Value Ref Range   Cholesterol, Total 124 100 - 199 mg/dL   Triglycerides 119 0 - 149 mg/dL   HDL 63 >14 mg/dL   VLDL Cholesterol Cal 19 5 - 40 mg/dL   LDL Chol Calc (NIH) 42 0 - 99 mg/dL   *Note: Due to a large number of results and/or encounters for the requested time period, some results have not been displayed. A complete set of results can be found in Results Review.      Assessment & Plan:   Problem List Items Addressed This Visit       Respiratory   Centrilobular emphysema (HCC) - Primary   Chronic, with mild exacerbation at present. Going into holiday weekend, will send in Augmentin  and Prednisone  to take if worsening or ongoing symptoms present. Continue Breztri , for triple therapy, is tolerating and Albuterol  as needed.  Continue to collaborate with pulmonary as needed in future.  Continue annual lung screening until age 65.  Will continue to collaborate with [facility] pharmacist for assistance on medications.      Relevant Medications   predniSONE  (DELTASONE ) 20 MG  tablet     Musculoskeletal and Integument   Hordeolum internum of right lower eyelid   Acute, noted to right lateral lower eyelid with mild edema externally as well.  Start erythromycin  gel to eye nightly for 7 days.  Recommend placing warm compresses on eye 3-4 times a day to help reduce swelling and offer comfort.        Other   Vitamin B12 deficiency   Chronic, stable.  Continue monthly injections.  Check level future visit.        Follow up plan: Return if symptoms worsen or fail to improve.

## 2024-01-29 NOTE — Assessment & Plan Note (Signed)
 Chronic, stable.  Continue monthly injections.  Check level future visit.

## 2024-01-29 NOTE — Patient Instructions (Signed)
 Stye A stye, also known as a hordeolum, is a bump that forms on an eyelid. It may look like a pimple next to the eyelash. A stye can form inside the eyelid (internal stye) or outside the eyelid (external stye). A stye can cause redness, swelling, and pain

## 2024-01-29 NOTE — Assessment & Plan Note (Addendum)
 Chronic, with mild exacerbation at present. Going into holiday weekend, will send in Augmentin  and Prednisone  to take if worsening or ongoing symptoms present. Continue Breztri , for triple therapy, is tolerating and Albuterol  as needed.  Continue to collaborate with pulmonary as needed in future.  Continue annual lung screening until age 78.  Will continue to collaborate with [facility] pharmacist for assistance on medications.

## 2024-01-29 NOTE — Assessment & Plan Note (Signed)
 Acute, noted to right lateral lower eyelid with mild edema externally as well.  Start erythromycin  gel to eye nightly for 7 days.  Recommend placing warm compresses on eye 3-4 times a day to help reduce swelling and offer comfort.

## 2024-02-02 ENCOUNTER — Ambulatory Visit

## 2024-02-04 ENCOUNTER — Encounter: Payer: Self-pay | Admitting: Nurse Practitioner

## 2024-02-04 ENCOUNTER — Ambulatory Visit
Admission: RE | Admit: 2024-02-04 | Discharge: 2024-02-04 | Disposition: A | Discharge status: Home / Self Care | Attending: Nurse Practitioner | Admitting: Nurse Practitioner

## 2024-02-04 ENCOUNTER — Ambulatory Visit: Payer: Self-pay

## 2024-02-04 ENCOUNTER — Ambulatory Visit
Admission: RE | Admit: 2024-02-04 | Discharge: 2024-02-04 | Disposition: A | Discharge status: Home / Self Care | Source: Ambulatory Visit | Attending: Nurse Practitioner | Admitting: Nurse Practitioner

## 2024-02-04 ENCOUNTER — Ambulatory Visit (INDEPENDENT_AMBULATORY_CARE_PROVIDER_SITE_OTHER): Admitting: Nurse Practitioner

## 2024-02-04 VITALS — BP 129/75 | HR 96 | Temp 98.6°F | Ht 69.0 in | Wt 202.0 lb

## 2024-02-04 DIAGNOSIS — J432 Centrilobular emphysema: Secondary | ICD-10-CM | POA: Diagnosis not present

## 2024-02-04 DIAGNOSIS — Z1231 Encounter for screening mammogram for malignant neoplasm of breast: Secondary | ICD-10-CM | POA: Diagnosis not present

## 2024-02-04 DIAGNOSIS — J449 Chronic obstructive pulmonary disease, unspecified: Secondary | ICD-10-CM | POA: Diagnosis not present

## 2024-02-04 DIAGNOSIS — R058 Other specified cough: Secondary | ICD-10-CM | POA: Diagnosis not present

## 2024-02-04 MED ORDER — PREDNISONE 20 MG PO TABS: 40.0000 mg | ORAL_TABLET | Freq: Every day | ORAL | 0 refills | Status: AC

## 2024-02-04 MED ORDER — AMOXICILLIN-POT CLAVULANATE 875-125 MG PO TABS: 1.0000 | ORAL_TABLET | Freq: Two times a day (BID) | ORAL | 0 refills | Status: AC

## 2024-02-04 NOTE — Telephone Encounter (Signed)
 Appt scheduled with you for 11:20 am this morning 02/04/2024.

## 2024-02-04 NOTE — Telephone Encounter (Signed)
 Noted.

## 2024-02-04 NOTE — Progress Notes (Signed)
 BP 129/75   Pulse 96   Temp 98.6 F (37 C) (Oral)   Ht 5\' 9"  (1.753 m)   Wt 202 lb (91.6 kg)   LMP  (LMP Unknown)   SpO2 96%   BMI 29.83 kg/m    Subjective:    Patient ID: Sharon [name], female    DOB: 1946-02-02, 78 y.o.   MRN: [id number]  HPI: Sharon [name] is a 78 y.o. female  Chief Complaint  Patient presents with   URI   UPPER RESPIRATORY TRACT INFECTION Presents today for cough follow-up.  She started Augmentin  on Saturday, took one Prednisone  today (just started).  Has been using nebulizer treatment at home and her inhaler regimen for COPD. Fever: no Cough: yes Shortness of breath: yes Wheezing: yes Chest pain: no Chest tightness: no Chest congestion: yes Nasal congestion: no Runny nose: yes Post nasal drip: yes Sneezing: no Sore throat: no Swollen glands: no Sinus pressure: yes Headache: yes Face pain: no Toothache: no Ear pain: yes "right Ear pressure: yes "right Eyes red/itching:no Eye drainage/crusting: no  Vomiting: no Rash: no Fatigue: yes Sick contacts: no Strep contacts: no  Context: stable Recurrent sinusitis: no Relief with OTC cold/cough medications: no  Treatments attempted: Augmentin , Prednisone  (just started), cough drops    Relevant past medical, surgical, family and social history reviewed and updated as indicated. Interim medical history since our last visit reviewed. Allergies and medications reviewed and updated.  Review of Systems  Constitutional:  Positive for fatigue. Negative for activity change, appetite change, chills and fever.  HENT:  Positive for congestion, postnasal drip, rhinorrhea and sinus pressure. Negative for ear discharge, ear pain, facial swelling, sinus pain, sneezing, sore throat and voice change.   Respiratory:  Positive for cough, chest tightness and wheezing. Negative for shortness of breath.   Cardiovascular:  Negative for chest pain, palpitations and leg swelling.  Endocrine: Negative.    Neurological:  Negative for dizziness, numbness and headaches.  Psychiatric/Behavioral: Negative.      Per HPI unless specifically indicated above     Objective:    BP 129/75   Pulse 96   Temp 98.6 F (37 C) (Oral)   Ht 5\' 9"  (1.753 m)   Wt 202 lb (91.6 kg)   LMP  (LMP Unknown)   SpO2 96%   BMI 29.83 kg/m   Wt Readings from Last 3 Encounters:  02/04/24 202 lb (91.6 kg)  01/29/24 202 lb (91.6 kg)  09/28/23 202 lb 9.6 oz (91.9 kg)    Physical Exam Vitals and nursing note reviewed.  Constitutional:      General: She is awake. She is not in acute distress.    Appearance: She is well-developed and well-groomed. She is not ill-appearing or toxic-appearing.  HENT:     Head: Normocephalic.     Right Ear: Hearing, tympanic membrane, ear canal and external ear normal.     Left Ear: Hearing, tympanic membrane, ear canal and external ear normal.     Nose: No rhinorrhea.     Right Sinus: Frontal sinus tenderness present. No maxillary sinus tenderness.     Left Sinus: Frontal sinus tenderness present. No maxillary sinus tenderness.     Mouth/Throat:     Mouth: Mucous membranes are moist.  Eyes:     General: Lids are normal.     Pupils: Pupils are equal, round, and reactive to light.  Neck:     Thyroid : No thyromegaly.     Vascular: No carotid bruit.  Cardiovascular:     Rate and Rhythm: Normal rate and regular rhythm.     Heart sounds: Normal heart sounds. No murmur heard.    No gallop.  Pulmonary:     Effort: Pulmonary effort is normal. No accessory muscle usage or respiratory distress.     Breath sounds: Wheezing present. No decreased breath sounds or rales.     Comments: Intermittent wheezes noted throughout and intermittent productive cough. Abdominal:     General: Bowel sounds are normal. There is no distension.     Palpations: Abdomen is soft.     Tenderness: There is no abdominal tenderness.  Musculoskeletal:     Cervical back: Normal range of motion and neck  supple.     Right lower leg: No edema.     Left lower leg: No edema.  Lymphadenopathy:     Cervical: No cervical adenopathy.  Skin:    General: Skin is warm and dry.  Neurological:     Mental Status: She is alert and oriented to person, place, and time.     Deep Tendon Reflexes: Reflexes are normal and symmetric.     Reflex Scores:      Brachioradialis reflexes are 2+ on the right side and 2+ on the left side.      Patellar reflexes are 2+ on the right side and 2+ on the left side. Psychiatric:        Attention and Perception: Attention normal.        Mood and Affect: Mood normal.        Speech: Speech normal.        Behavior: Behavior normal. Behavior is cooperative.        Thought Content: Thought content normal.     Results for orders placed or performed in visit on 11/13/23  Bayer DCA Hb A1c Waived   Collection Time: 11/13/23  2:38 PM  Result Value Ref Range   HB A1C (BAYER DCA - WAIVED) 6.2 (H) 4.8 - 5.6 %  Microalbumin, Urine Waived   Collection Time: 11/13/23  2:38 PM  Result Value Ref Range   Microalb, Ur Waived 30 (H) 0 - 19 mg/L   Creatinine, Urine Waived 200 10 - 300 mg/dL   Microalb/Creat Ratio <30 <30 mg/g  CBC with Differential/Platelet   Collection Time: 11/13/23  2:39 PM  Result Value Ref Range   WBC 6.7 3.4 - 10.8 x10E3/uL   RBC 4.49 3.77 - 5.28 x10E6/uL   Hemoglobin 13.8 11.1 - 15.9 g/dL   Hematocrit 09.8 11.9 - 46.6 %   MCV 92 79 - 97 fL   MCH 30.7 26.6 - 33.0 pg   MCHC 33.4 31.5 - 35.7 g/dL   RDW 14.7 82.9 - 56.2 %   Platelets 191 150 - 450 x10E3/uL   Neutrophils 71 Not Estab. %   Lymphs 21 Not Estab. %   Monocytes 7 Not Estab. %   Eos 1 Not Estab. %   Basos 0 Not Estab. %   Neutrophils Absolute 4.7 1.4 - 7.0 x10E3/uL   Lymphocytes Absolute 1.4 0.7 - 3.1 x10E3/uL   Monocytes Absolute 0.5 0.1 - 0.9 x10E3/uL   EOS (ABSOLUTE) 0.0 0.0 - 0.4 x10E3/uL   Basophils Absolute 0.0 0.0 - 0.2 x10E3/uL   Immature Granulocytes 0 Not Estab. %   Immature  Grans (Abs) 0.0 0.0 - 0.1 x10E3/uL  Comprehensive metabolic panel   Collection Time: 11/13/23  2:39 PM  Result Value Ref Range   Glucose 185 (H) 70 -  99 mg/dL   BUN 14 8 - 27 mg/dL   Creatinine, Ser 6.21 0.57 - 1.00 mg/dL   eGFR 62 >30 QM/VHQ/4.69   BUN/Creatinine Ratio 15 12 - 28   Sodium 141 134 - 144 mmol/L   Potassium 4.3 3.5 - 5.2 mmol/L   Chloride 105 96 - 106 mmol/L   CO2 23 20 - 29 mmol/L   Calcium  9.1 8.7 - 10.3 mg/dL   Total Protein 6.2 6.0 - 8.5 g/dL   Albumin 4.1 3.8 - 4.8 g/dL   Globulin, Total 2.1 1.5 - 4.5 g/dL   Bilirubin Total 0.4 0.0 - 1.2 mg/dL   Alkaline Phosphatase 91 44 - 121 IU/L   AST 17 0 - 40 IU/L   ALT 15 0 - 32 IU/L  Lipid Panel w/o Chol/HDL Ratio   Collection Time: 11/13/23  2:39 PM  Result Value Ref Range   Cholesterol, Total 124 100 - 199 mg/dL   Triglycerides 629 0 - 149 mg/dL   HDL 63 >52 mg/dL   VLDL Cholesterol Cal 19 5 - 40 mg/dL   LDL Chol Calc (NIH) 42 0 - 99 mg/dL   *Note: Due to a large number of results and/or encounters for the requested time period, some results have not been displayed. A complete set of results can be found in Results Review.      Assessment & Plan:   Problem List Items Addressed This Visit       Respiratory   Centrilobular emphysema (HCC) - Primary   Chronic, with mild exacerbation at present. Will extend Augmentin  by 3 days and Prednisone . Obtain chest imaging to further assess.  Continue Breztri , for triple therapy, is tolerating and Albuterol  as needed.  Continue to collaborate with pulmonary as needed in future.  Continue annual lung screening until age 45.  Will continue to collaborate with [facility] pharmacist for assistance on medications.      Relevant Medications   predniSONE  (DELTASONE ) 20 MG tablet   Other Relevant Orders   DG Chest 2 View   Other Visit Diagnoses       Encounter for screening mammogram for malignant neoplasm of breast       Mammogram ordered   Relevant Orders   MM 3D SCREENING  MAMMOGRAM BILATERAL BREAST        Follow up plan: Return in about 1 week (around 02/11/2024) for COPD exacerbation.

## 2024-02-04 NOTE — Telephone Encounter (Signed)
 Chief Complaint: cough Symptoms: cough  Frequency: since 4/17 Pertinent Negatives: Patient denies sob and fever Disposition: [] ED /[] Urgent Care (no appt availability in office) / [x] Appointment(In office/virtual)/ []  [facility]/ [] Home Care/ [] Refused Recommended Disposition /[] [facility]/ [x]  Follow-up with PCP Additional Notes: states that she started amoxicillin  on 01/30/24 but has not started prednisone  yet. States that her cough is getting worse. Patient is wanting a chest x-ray.  States that she does the  for cough. States that she want to know do you want her to start the prednisone  also with the two days of amoxicillin  she has left.  Pt is questioning a steroid injection to help.  Instructed to start prednisone  and scheduled appt for today.  Copied from CRM [id number]. Topic: Clinical - Red Word Triage >> Feb 04, 2024  9:14 AM Zipporah Him wrote: Red Word that prompted transfer to Nurse Triage: Worsening cough and congestion, recently started meds from [name]. She has not started prednisone . Reason for Disposition  Wheezing is present  Answer Assessment - Initial Assessment Questions 1. ONSET: "When did the cough begin?"      4/17 2. SEVERITY: "How bad is the cough today?"      bad 3. SPUTUM: "Describe the color of your sputum" (none, dry cough; clear, white, yellow, green)     gree 4. HEMOPTYSIS: "Are you coughing up any blood?" If so ask: "How much?" (flecks, streaks, tablespoons, etc.)     no 5. DIFFICULTY BREATHING: "Are you having difficulty breathing?" If Yes, ask: "How bad is it?" (e.g., mild, moderate, severe)    - MILD: No SOB at rest, mild SOB with walking, speaks normally in sentences, can lie down, no retractions, pulse < 100.    - MODERATE: SOB at rest, SOB with minimal exertion and prefers to sit, cannot lie down flat, speaks in phrases, mild retractions, audible wheezing, pulse 100-120.    - SEVERE: Very SOB at rest, speaks in single words,  struggling to breathe, sitting hunched forward, retractions, pulse > 120      no 6. FEVER: "Do you have a fever?" If Yes, ask: "What is your temperature, how was it measured, and when did it start?"     no  8. LUNG HISTORY: "Do you have any history of lung disease?"  (e.g., pulmonary embolus, asthma, emphysema)     emphysema  10. OTHER SYMPTOMS: "Do you have any other symptoms?" (e.g., runny nose, wheezing, chest pain)       wheezing  Protocols used: Cough - Acute Productive-A-AH

## 2024-02-04 NOTE — Assessment & Plan Note (Signed)
 Chronic, with mild exacerbation at present. Will extend Augmentin  by 3 days and Prednisone . Obtain chest imaging to further assess.  Continue Breztri , for triple therapy, is tolerating and Albuterol  as needed.  Continue to collaborate with pulmonary as needed in future.  Continue annual lung screening until age 78.  Will continue to collaborate with [facility] pharmacist for assistance on medications.

## 2024-02-04 NOTE — Patient Instructions (Signed)
 COPD Exacerbation This video will teach you what types of triggers can make COPD worse, and how to avoid them. To view the content, go to this web address: [email or web address]  This video will expire on: 09/23/2025. If you need access to this video following this date, please reach out to the healthcare provider who assigned it to you. This information is not intended to replace advice given to you by your health care provider. Make sure you discuss any questions you have with your health care provider. Elsevier Patient Education  2024 [employer].

## 2024-02-04 NOTE — Progress Notes (Signed)
 Contacted via MyChart   Overall chest imaging is stable:)

## 2024-02-07 NOTE — Patient Instructions (Addendum)
 Please call to schedule your mammogram and/or bone density: [facility] [facility]  Address: [address], [place], [place] [address] Phone: [phone]  [facility] [address] [place],  [place]  [address] Phone: [phone]   Be Involved in Caring For Your Health:  Taking Medications When medications are taken as directed, they can greatly improve your health. But if they are not taken as prescribed, they may not work. In some cases, not taking them correctly can be harmful. To help ensure your treatment remains effective and safe, understand your medications and how to take them. Bring your medications to each visit for review by your provider.  Your lab results, notes, and after visit summary will be available on My Chart. We strongly encourage you to use this feature. If lab results are abnormal the clinic will contact you with the appropriate steps. If the clinic does not contact you assume the results are satisfactory. You can always view your results on My Chart. If you have questions regarding your health or results, please contact the clinic during office hours. You can also ask questions on My Chart.  We at [facility] are grateful that you chose us  to provide your care. We strive to provide evidence-based and compassionate care and are always looking for feedback. If you get a survey from the clinic please complete this so we can hear your opinions.  COPD Exacerbation This video will teach you what types of triggers can make COPD worse, and how to avoid them. To view the content, go to this web address: [email or web address]  This video will expire on: 09/23/2025. If you need access to this video following this date, please reach out to the healthcare provider who assigned it to you. This information is not intended to replace advice given to you by your health care provider. Make  sure you discuss any questions you have with your health care provider. Elsevier Patient Education  2024 [employer].

## 2024-02-12 ENCOUNTER — Encounter: Payer: Self-pay | Admitting: Nurse Practitioner

## 2024-02-12 ENCOUNTER — Ambulatory Visit (INDEPENDENT_AMBULATORY_CARE_PROVIDER_SITE_OTHER): Admitting: Nurse Practitioner

## 2024-02-12 VITALS — BP 123/68 | HR 71 | Temp 98.5°F | Resp 18 | Ht 69.0 in | Wt 202.0 lb

## 2024-02-12 DIAGNOSIS — J432 Centrilobular emphysema: Secondary | ICD-10-CM

## 2024-02-12 NOTE — Progress Notes (Signed)
 BP 123/68 (BP Location: Left Arm, Patient Position: Sitting, Cuff Size: Normal)   Pulse 71   Temp 98.5 F (36.9 C) (Oral)   Resp 18   Ht 5\' 9"  (1.753 m)   Wt 202 lb (91.6 kg)   LMP  (LMP Unknown)   SpO2 96%   BMI 29.83 kg/m    Subjective:    Patient ID: Sharon [name], female    DOB: 06/03/1946, 78 y.o.   MRN: [id number]  HPI: Sharon [name] is a 78 y.o. female  Chief Complaint  Patient presents with   COPD    States URI from caregiver. Still has the cough and allergies with coughing. Still green phlegm.    UPPER RESPIRATORY TRACT INFECTION Presents today for cough follow-up. Was treated with Augmentin  and Prednisone .  Has been using nebulizer treatment at home and her inhaler regimen for COPD.  She has completed Augmentin  and has a dose of Prednisone  left.  Chest imaging was reassuring on 02/04/24.  Uses Breztri  and Albuterol  at home for emphysema. Fever: no Cough: yes but is improving Shortness of breath: no Wheezing: a little continues Chest pain: no Chest tightness: no Chest congestion: no Nasal congestion: no Runny nose: yes Post nasal drip: yes Sneezing: no Sore throat: no Swollen glands: no Sinus pressure: no Headache: no Face pain: no Toothache: no Ear pain: no Ear pressure: no Eyes red/itching:no Eye drainage/crusting: no  Vomiting: no Rash: no Fatigue: yes Sick contacts: no Strep contacts: no  Context: stable Recurrent sinusitis: no Relief with OTC cold/cough medications: no  Treatments attempted: Augmentin , Prednisone  (just started), cough drops    Relevant past medical, surgical, family and social history reviewed and updated as indicated. Interim medical history since our last visit reviewed. Allergies and medications reviewed and updated.  Review of Systems  Constitutional:  Negative for activity change, appetite change, chills, fatigue and fever.  HENT:  Positive for congestion, postnasal drip and rhinorrhea. Negative for ear discharge, ear  pain, facial swelling, sinus pressure, sinus pain, sneezing, sore throat and voice change.   Respiratory:  Positive for cough and wheezing. Negative for chest tightness and shortness of breath.   Cardiovascular:  Negative for chest pain, palpitations and leg swelling.  Endocrine: Negative.   Neurological:  Negative for dizziness, numbness and headaches.  Psychiatric/Behavioral: Negative.      Per HPI unless specifically indicated above     Objective:    BP 123/68 (BP Location: Left Arm, Patient Position: Sitting, Cuff Size: Normal)   Pulse 71   Temp 98.5 F (36.9 C) (Oral)   Resp 18   Ht 5\' 9"  (1.753 m)   Wt 202 lb (91.6 kg)   LMP  (LMP Unknown)   SpO2 96%   BMI 29.83 kg/m   Wt Readings from Last 3 Encounters:  02/12/24 202 lb (91.6 kg)  02/04/24 202 lb (91.6 kg)  01/29/24 202 lb (91.6 kg)    Physical Exam Vitals and nursing note reviewed.  Constitutional:      General: She is awake. She is not in acute distress.    Appearance: She is well-developed and well-groomed. She is not ill-appearing or toxic-appearing.  HENT:     Head: Normocephalic.     Right Ear: Hearing, tympanic membrane, ear canal and external ear normal.     Left Ear: Hearing, tympanic membrane, ear canal and external ear normal.     Nose: No rhinorrhea.     Right Sinus: No maxillary sinus tenderness or frontal sinus  tenderness.     Left Sinus: No maxillary sinus tenderness or frontal sinus tenderness.     Mouth/Throat:     Mouth: Mucous membranes are moist.  Eyes:     General: Lids are normal.     Pupils: Pupils are equal, round, and reactive to light.  Neck:     Thyroid : No thyromegaly.     Vascular: No carotid bruit.  Cardiovascular:     Rate and Rhythm: Normal rate and regular rhythm.     Heart sounds: Normal heart sounds. No murmur heard.    No gallop.  Pulmonary:     Effort: Pulmonary effort is normal. No accessory muscle usage or respiratory distress.     Breath sounds: No decreased breath  sounds, wheezing or rales.     Comments: Overall clear lung sounds on exam today. Abdominal:     General: Bowel sounds are normal. There is no distension.     Palpations: Abdomen is soft.     Tenderness: There is no abdominal tenderness.  Musculoskeletal:     Cervical back: Normal range of motion and neck supple.     Right lower leg: No edema.     Left lower leg: No edema.  Lymphadenopathy:     Cervical: No cervical adenopathy.  Skin:    General: Skin is warm and dry.  Neurological:     Mental Status: She is alert and oriented to person, place, and time.     Deep Tendon Reflexes: Reflexes are normal and symmetric.     Reflex Scores:      Brachioradialis reflexes are 2+ on the right side and 2+ on the left side.      Patellar reflexes are 2+ on the right side and 2+ on the left side. Psychiatric:        Attention and Perception: Attention normal.        Mood and Affect: Mood normal.        Speech: Speech normal.        Behavior: Behavior normal. Behavior is cooperative.        Thought Content: Thought content normal.     Results for orders placed or performed in visit on 11/13/23  Bayer DCA Hb A1c Waived   Collection Time: 11/13/23  2:38 PM  Result Value Ref Range   HB A1C (BAYER DCA - WAIVED) 6.2 (H) 4.8 - 5.6 %  Microalbumin, Urine Waived   Collection Time: 11/13/23  2:38 PM  Result Value Ref Range   Microalb, Ur Waived 30 (H) 0 - 19 mg/L   Creatinine, Urine Waived 200 10 - 300 mg/dL   Microalb/Creat Ratio <30 <30 mg/g  CBC with Differential/Platelet   Collection Time: 11/13/23  2:39 PM  Result Value Ref Range   WBC 6.7 3.4 - 10.8 x10E3/uL   RBC 4.49 3.77 - 5.28 x10E6/uL   Hemoglobin 13.8 11.1 - 15.9 g/dL   Hematocrit 40.9 81.1 - 46.6 %   MCV 92 79 - 97 fL   MCH 30.7 26.6 - 33.0 pg   MCHC 33.4 31.5 - 35.7 g/dL   RDW 91.4 78.2 - 95.6 %   Platelets 191 150 - 450 x10E3/uL   Neutrophils 71 Not Estab. %   Lymphs 21 Not Estab. %   Monocytes 7 Not Estab. %   Eos 1 Not  Estab. %   Basos 0 Not Estab. %   Neutrophils Absolute 4.7 1.4 - 7.0 x10E3/uL   Lymphocytes Absolute 1.4 0.7 - 3.1 x10E3/uL  Monocytes Absolute 0.5 0.1 - 0.9 x10E3/uL   EOS (ABSOLUTE) 0.0 0.0 - 0.4 x10E3/uL   Basophils Absolute 0.0 0.0 - 0.2 x10E3/uL   Immature Granulocytes 0 Not Estab. %   Immature Grans (Abs) 0.0 0.0 - 0.1 x10E3/uL  Comprehensive metabolic panel   Collection Time: 11/13/23  2:39 PM  Result Value Ref Range   Glucose 185 (H) 70 - 99 mg/dL   BUN 14 8 - 27 mg/dL   Creatinine, Ser 0.98 0.57 - 1.00 mg/dL   eGFR 62 >11 BJ/YNW/2.95   BUN/Creatinine Ratio 15 12 - 28   Sodium 141 134 - 144 mmol/L   Potassium 4.3 3.5 - 5.2 mmol/L   Chloride 105 96 - 106 mmol/L   CO2 23 20 - 29 mmol/L   Calcium  9.1 8.7 - 10.3 mg/dL   Total Protein 6.2 6.0 - 8.5 g/dL   Albumin 4.1 3.8 - 4.8 g/dL   Globulin, Total 2.1 1.5 - 4.5 g/dL   Bilirubin Total 0.4 0.0 - 1.2 mg/dL   Alkaline Phosphatase 91 44 - 121 IU/L   AST 17 0 - 40 IU/L   ALT 15 0 - 32 IU/L  Lipid Panel w/o Chol/HDL Ratio   Collection Time: 11/13/23  2:39 PM  Result Value Ref Range   Cholesterol, Total 124 100 - 199 mg/dL   Triglycerides 621 0 - 149 mg/dL   HDL 63 >30 mg/dL   VLDL Cholesterol Cal 19 5 - 40 mg/dL   LDL Chol Calc (NIH) 42 0 - 99 mg/dL   *Note: Due to a large number of results and/or encounters for the requested time period, some results have not been displayed. A complete set of results can be found in Results Review.      Assessment & Plan:   Problem List Items Addressed This Visit       Respiratory   Centrilobular emphysema (HCC) - Primary   Chronic, improved exacerbation. Continue Breztri , for triple therapy, is tolerating and Albuterol  as needed.  Continue to collaborate with pulmonary as needed in future.  Continue annual lung screening until age 30.  Will continue to collaborate with [facility] pharmacist for assistance on medications.         Follow up plan: Return if symptoms worsen or fail to  improve.

## 2024-02-12 NOTE — Assessment & Plan Note (Signed)
 Chronic, improved exacerbation. Continue Breztri , for triple therapy, is tolerating and Albuterol  as needed.  Continue to collaborate with pulmonary as needed in future.  Continue annual lung screening until age 78.  Will continue to collaborate with [facility] pharmacist for assistance on medications.

## 2024-02-22 ENCOUNTER — Encounter (HOSPITAL_COMMUNITY): Payer: Self-pay

## 2024-02-29 ENCOUNTER — Ambulatory Visit

## 2024-02-29 DIAGNOSIS — E538 Deficiency of other specified B group vitamins: Secondary | ICD-10-CM

## 2024-02-29 MED ORDER — CYANOCOBALAMIN 1000 MCG/ML IJ SOLN
1000.0000 ug | INTRAMUSCULAR | Status: AC
  Administered 2024-02-29 – 2024-06-30 (×4): 1000 ug via INTRAMUSCULAR

## 2024-02-29 NOTE — Progress Notes (Signed)
 Patient is in office today for a nurse visit for B12 Injection. Patient Injection was given in the  Left deltoid. Patient tolerated injection well.

## 2024-03-01 ENCOUNTER — Encounter (INDEPENDENT_AMBULATORY_CARE_PROVIDER_SITE_OTHER): Payer: Self-pay

## 2024-03-10 DIAGNOSIS — Z8673 Personal history of transient ischemic attack (TIA), and cerebral infarction without residual deficits: Secondary | ICD-10-CM | POA: Diagnosis not present

## 2024-03-10 DIAGNOSIS — Z961 Presence of intraocular lens: Secondary | ICD-10-CM | POA: Diagnosis not present

## 2024-03-10 DIAGNOSIS — H43813 Vitreous degeneration, bilateral: Secondary | ICD-10-CM | POA: Diagnosis not present

## 2024-03-10 DIAGNOSIS — H02831 Dermatochalasis of right upper eyelid: Secondary | ICD-10-CM | POA: Diagnosis not present

## 2024-03-11 ENCOUNTER — Other Ambulatory Visit: Payer: Self-pay | Admitting: Physician Assistant

## 2024-03-15 ENCOUNTER — Encounter: Payer: Self-pay | Admitting: Nurse Practitioner

## 2024-03-18 ENCOUNTER — Encounter: Payer: Self-pay | Admitting: Pediatrics

## 2024-03-18 ENCOUNTER — Telehealth: Admitting: Pediatrics

## 2024-03-18 DIAGNOSIS — J441 Chronic obstructive pulmonary disease with (acute) exacerbation: Secondary | ICD-10-CM

## 2024-03-18 MED ORDER — PREDNISONE 20 MG PO TABS
20.0000 mg | ORAL_TABLET | Freq: Every day | ORAL | 0 refills | Status: AC
Start: 2024-03-18 — End: 2024-03-23

## 2024-03-18 MED ORDER — AMOXICILLIN-POT CLAVULANATE 875-125 MG PO TABS: 1.0000 | ORAL_TABLET | Freq: Two times a day (BID) | ORAL | 0 refills | Status: AC

## 2024-03-18 NOTE — Patient Instructions (Signed)
 Augementin for 7 days Prednisone  for 5 days  Please seek medical attention in the Emergency Department immediately if you develop persistant or increased chest pain, shortness of breath, fainting spells, sudden sweatiness, pain radiating to your back, coughing up blood, leg swelling, fevers greater than 100.38F that do not resolve with Tylenol , or any other symptoms that concern you.

## 2024-03-18 NOTE — Progress Notes (Signed)
 Telehealth Visit  I connected with  Sharon [name] on 03/18/24 by a video enabled telemedicine application and verified that I am speaking with the correct person using two identifiers.   I discussed the limitations of evaluation and management by telemedicine. The patient expressed understanding and agreed to proceed.  Subjective:    Patient ID: Sharon [name], female    DOB: 1945/12/07, 78 y.o.   MRN: [id number]  HPI: Sharon [name] is a 78 y.o. female  Chief Complaint  Patient presents with   URI    Started the beginning of the week    Discussed the use of AI scribe software for clinical note transcription with the patient, who gave verbal consent to proceed.  History of Present Illness   Sharon [name] is a 78 year old female with COPD who presents with worsening respiratory symptoms and cough.  Her respiratory symptoms began worsening around Tuesday, with significant coughing and production of sputum. Initially, the sputum was described as a 'neon mess' but has since changed to a greenish-yellow color. She has been using her emergency inhaler, albuterol , more frequently due to these symptoms.  She recalls a similar episode in April when she became ill after exposure to a sick care worker. During that time, she was treated with prednisone  and Augmentin , which she tolerated well. She mentions a previous adverse reaction to doxycycline , which is not listed in her allergy chart.  She has been experiencing a headache and has been using Tylenol  as recommended.  She is scheduled for a B12 shot on the nineteenth.      Relevant past medical, surgical, family and social history reviewed and updated as indicated. Interim medical history since our last visit reviewed. Allergies and medications reviewed and updated.  ROS per HPI unless specifically indicated above     Objective:     LMP  (LMP Unknown)   Wt Readings from Last 3 Encounters:  02/12/24 202 lb (91.6 kg)  02/04/24 202 lb  (91.6 kg)  01/29/24 202 lb (91.6 kg)     Physical Exam Constitutional:      General: She is not in acute distress.    Appearance: Normal appearance.     Comments: Sounds congested  Neurological:     General: No focal deficit present.     Mental Status: She is alert. Mental status is at baseline.      LIMITED EXAM GIVEN VIDEO VISIT     Assessment & Plan:  Assessment & Plan   COPD exacerbation (HCC) Limited evaluation given video visit but has COPD hx. Suspect cause of sx due to COPD exacerbation, supported by increased cough, greenish-yellow sputum, and increased albuterol  use. Per patient, does not tolerate doxy and Augmentin  preferred.  - Prescribe prednisone  for 5 days. - Prescribe Augmentin  for 7 days. - Advise continued use of albuterol  inhaler as needed. - Instruct to follow up if symptoms do not improve. -     Amoxicillin -Pot Clavulanate; Take 1 tablet by mouth 2 (two) times daily for 7 days.  Dispense: 14 tablet; Refill: 0 -     predniSONE ; Take 1 tablet (20 mg total) by mouth daily with breakfast for 5 days.  Dispense: 5 tablet; Refill: 0  Follow up plan: No follow-ups on file.  Sharon Letters, MD   This visit was completed via video visit through MyChart due to the restrictions of the COVID-19 pandemic. All issues as above were discussed and addressed. Physical exam was done as above  through visual confirmation on video through MyChart. If it was felt that the patient should be evaluated in the office, they were directed there. The patient verbally consented to this visit.  Location of the patient: home Location of the provider: work Those involved with this call:  Provider: Geraldine Kling, MD CMA: Mancil Seat, CMA Time spent on call: 15 minutes with patient face to face via video conference. More than 50% of this time was spent in counseling and coordination of care. 15 minutes total spent in review of patient's record and preparation of their chart. Total time  spent on this encounter: 30 minutes.

## 2024-03-23 DIAGNOSIS — R1310 Dysphagia, unspecified: Secondary | ICD-10-CM | POA: Diagnosis not present

## 2024-03-23 DIAGNOSIS — R531 Weakness: Secondary | ICD-10-CM | POA: Diagnosis not present

## 2024-03-23 DIAGNOSIS — R202 Paresthesia of skin: Secondary | ICD-10-CM | POA: Diagnosis not present

## 2024-03-23 DIAGNOSIS — R4781 Slurred speech: Secondary | ICD-10-CM | POA: Diagnosis not present

## 2024-03-23 DIAGNOSIS — K117 Disturbances of salivary secretion: Secondary | ICD-10-CM | POA: Diagnosis not present

## 2024-03-23 DIAGNOSIS — R42 Dizziness and giddiness: Secondary | ICD-10-CM | POA: Diagnosis not present

## 2024-03-23 DIAGNOSIS — I639 Cerebral infarction, unspecified: Secondary | ICD-10-CM | POA: Diagnosis not present

## 2024-03-23 DIAGNOSIS — R200 Anesthesia of skin: Secondary | ICD-10-CM | POA: Diagnosis not present

## 2024-03-29 ENCOUNTER — Ambulatory Visit (INDEPENDENT_AMBULATORY_CARE_PROVIDER_SITE_OTHER): Admitting: Nurse Practitioner

## 2024-03-29 ENCOUNTER — Encounter: Payer: Self-pay | Admitting: Nurse Practitioner

## 2024-03-29 VITALS — BP 111/65 | HR 87 | Temp 97.6°F | Resp 16 | Ht 69.0 in | Wt 201.0 lb

## 2024-03-29 DIAGNOSIS — J432 Centrilobular emphysema: Secondary | ICD-10-CM | POA: Diagnosis not present

## 2024-03-29 NOTE — Patient Instructions (Signed)
 COPD and Physical Activity Chronic obstructive pulmonary disease (COPD) is a long-term, or chronic, condition that affects the lungs. COPD is a general term that can be used to describe many problems that cause inflammation of the lungs and limit airflow. These conditions include chronic bronchitis and emphysema. The main symptom of COPD is shortness of breath, which makes it harder to do even simple tasks. This can also make it harder to exercise and stay active. Talk with your health care provider about treatments to help you breathe better and actions you can take to prevent breathing problems during physical activity. What are the benefits of exercising when you have COPD? Exercising regularly is an important part of a healthy lifestyle. You can still exercise and do physical activities even though you have COPD. Exercise and physical activity improve your shortness of breath by increasing blood flow (circulation). This causes your heart to pump more oxygen through your body. Moderate exercise can: Improve oxygen use. Increase your energy level. Help with shortness of breath. Strengthen your breathing muscles. Improve heart health. Help with sleep. Improve your self-esteem and feelings of self-worth. Lower depression, stress, and anxiety. Exercise can benefit everyone with COPD. The severity of your disease may affect how hard you can exercise, especially at first, but everyone can benefit. Talk with your health care provider about how much exercise is safe for you, and which activities and exercises are safe for you. What actions can I take to prevent breathing problems during physical activity? Sign up for a pulmonary rehabilitation program. This type of program may include: Education about lung diseases. Exercise classes that teach you how to exercise and be more active while improving your breathing. This usually involves: Exercise using your lower extremities, such as a stationary  bicycle. About 30 minutes of exercise, 2 to 5 times per week, for 6 to 12 weeks. Strength training, such as push-ups or leg lifts. Nutrition education. Group classes in which you can talk with others who also have COPD and learn ways to manage stress. If you use an oxygen tank, you should use it while you exercise. Work with your health care provider to adjust your oxygen for your physical activity. Your resting flow rate is different from your flow rate during physical activity. How to manage your breathing while exercising While you are exercising: Take slow breaths. Pace yourself, and do nottry to go too fast. Purse your lips while breathing out. Pursing your lips is similar to a kissing or whistling position. If doing exercise that uses a quick burst of effort, such as weight lifting: Breathe in before starting the exercise. Breathe out during the hardest part of the exercise, such as raising the weights. Where to find support You can find support for exercising with COPD from: Your health care provider. A pulmonary rehabilitation program. Your local health department or community health programs. Support groups, either online or in-person. Your health care provider may be able to recommend support groups. Where to find more information You can find more information about exercising with COPD from: American Lung Association: lung.org COPD Foundation: copdfoundation.org Contact a health care provider if: Your symptoms get worse. You have nausea. You have a fever. You want to start a new exercise program or a new activity. Get help right away if: You have chest pain. You cannot breathe. These symptoms may represent a serious problem that is an emergency. Do not wait to see if the symptoms will go away. Get medical help right away. Call  your local emergency services (911 in the [place] Do not drive yourself to the hospital. Summary COPD is a general term that can be used to describe  many different lung problems that cause lung inflammation and limit airflow. This includes chronic bronchitis and emphysema. Exercise and physical activity improve your shortness of breath by increasing blood flow (circulation). This causes your heart to provide more oxygen to your body. Contact your health care provider before starting any exercise program or new activity. Ask your health care provider what exercises and activities are safe for you. This information is not intended to replace advice given to you by your health care provider. Make sure you discuss any questions you have with your health care provider. Document Revised: 08/14/2023 Document Reviewed: 08/14/2023 Elsevier Patient Education  2024 [employer].

## 2024-03-29 NOTE — Assessment & Plan Note (Signed)
 Chronic, ongoing with more mucus. Continue Breztri , for triple therapy, is tolerating and Albuterol  as needed. Referral to pulmonary placed for further assessment.  Check Alpha 1 today. Continue annual lung screening until age 78. Will continue to collaborate with [facility] pharmacist for assistance on medications. We discussed adding Singulair  due to underlying allergies, educated on this and BLACK BOX warning.  She prefers not to start.

## 2024-03-29 NOTE — Progress Notes (Signed)
 BP 111/65 (BP Location: Left Arm)   Pulse 87   Temp 97.6 F (36.4 C) (Oral)   Resp 16   Ht 5' 9 (1.753 m)   Wt 201 lb (91.2 kg)   LMP  (LMP Unknown)   SpO2 96%   BMI 29.68 kg/m    Subjective:    Patient ID: Sharon [name], female    DOB: 1945/11/09, 78 y.o.   MRN: [id number]  HPI: Sharon [name] is a 78 y.o. female  Chief Complaint  Patient presents with   Medical Management of Chronic Issues   COPD Follow-up today for COPD exacerbation, was treated on 03/18/24 with Augmentin  and Prednisone .  Prior to this last exacerbation was on 02/04/24.  Uses Breztri , Albuterol , and OTC allergy medication (Claritin ), does not tolerate nasal sprays. Has not seen pulmonary.  Continues to have lot of phlegm.  Has seen ENT (Dr. Celso College), they were assessing inner ear with dizziness and checked vocal cords due to hoarseness.   COPD status: uncontrolled Satisfied with current treatment?: yes Oxygen use: no Dyspnea frequency: no Cough frequency: ongoing with lots of mucus Rescue inhaler frequency:  uses it every morning before Breztri , but does not do this at night Limitation of activity: no Productive cough: as above Last Spirometry: years ago Pneumovax: Up to Date Influenza: Up to Date   Relevant past medical, surgical, family and social history reviewed and updated as indicated. Interim medical history since our last visit reviewed. Allergies and medications reviewed and updated.  Review of Systems  Constitutional:  Negative for activity change, appetite change, diaphoresis, fatigue and fever.  Respiratory:  Positive for cough and wheezing. Negative for chest tightness and shortness of breath.   Cardiovascular:  Negative for chest pain, palpitations and leg swelling.  Gastrointestinal: Negative.   Endocrine: Negative for polydipsia, polyphagia and polyuria.  Neurological: Negative.   Psychiatric/Behavioral: Negative.      Per HPI unless specifically indicated above     Objective:     BP 111/65 (BP Location: Left Arm)   Pulse 87   Temp 97.6 F (36.4 C) (Oral)   Resp 16   Ht 5' 9 (1.753 m)   Wt 201 lb (91.2 kg)   LMP  (LMP Unknown)   SpO2 96%   BMI 29.68 kg/m   Wt Readings from Last 3 Encounters:  03/29/24 201 lb (91.2 kg)  02/12/24 202 lb (91.6 kg)  02/04/24 202 lb (91.6 kg)    Physical Exam Vitals and nursing note reviewed.  Constitutional:      General: She is awake. She is not in acute distress.    Appearance: She is well-developed and well-groomed. She is obese. She is not ill-appearing or toxic-appearing.  HENT:     Head: Normocephalic.     Right Ear: Hearing and external ear normal.     Left Ear: Hearing and external ear normal.   Eyes:     General: Lids are normal.        Right eye: No discharge.        Left eye: No discharge.     Conjunctiva/sclera: Conjunctivae normal.     Pupils: Pupils are equal, round, and reactive to light.   Neck:     Thyroid : No thyromegaly.     Vascular: No carotid bruit.   Cardiovascular:     Rate and Rhythm: Normal rate and regular rhythm.     Heart sounds: Normal heart sounds. No murmur heard.    No gallop.  Pulmonary:  Effort: Pulmonary effort is normal. No accessory muscle usage or respiratory distress.     Breath sounds: Wheezing (occasional expiratory wheezes noted throughout) present. No decreased breath sounds or rales.     Comments: Baseline hoarseness present Abdominal:     General: Bowel sounds are normal. There is no distension.     Palpations: Abdomen is soft.     Tenderness: There is no abdominal tenderness.   Musculoskeletal:     Cervical back: Normal range of motion and neck supple.     Right lower leg: No edema.     Left lower leg: No edema.  Lymphadenopathy:     Cervical: No cervical adenopathy.   Skin:    General: Skin is warm and dry.   Neurological:     Mental Status: She is alert and oriented to person, place, and time.     Deep Tendon Reflexes: Reflexes are normal and  symmetric.     Reflex Scores:      Brachioradialis reflexes are 2+ on the right side and 2+ on the left side.      Patellar reflexes are 2+ on the right side and 2+ on the left side.  Psychiatric:        Attention and Perception: Attention normal.        Mood and Affect: Mood normal.        Speech: Speech normal.        Behavior: Behavior normal. Behavior is cooperative.        Thought Content: Thought content normal.    Results for orders placed or performed in visit on 11/13/23  Bayer DCA Hb A1c Waived   Collection Time: 11/13/23  2:38 PM  Result Value Ref Range   HB A1C (BAYER DCA - WAIVED) 6.2 (H) 4.8 - 5.6 %  Microalbumin, Urine Waived   Collection Time: 11/13/23  2:38 PM  Result Value Ref Range   Microalb, Ur Waived 30 (H) 0 - 19 mg/L   Creatinine, Urine Waived 200 10 - 300 mg/dL   Microalb/Creat Ratio <30 <30 mg/g  CBC with Differential/Platelet   Collection Time: 11/13/23  2:39 PM  Result Value Ref Range   WBC 6.7 3.4 - 10.8 x10E3/uL   RBC 4.49 3.77 - 5.28 x10E6/uL   Hemoglobin 13.8 11.1 - 15.9 g/dL   Hematocrit 32.4 40.1 - 46.6 %   MCV 92 79 - 97 fL   MCH 30.7 26.6 - 33.0 pg   MCHC 33.4 31.5 - 35.7 g/dL   RDW 02.7 25.3 - 66.4 %   Platelets 191 150 - 450 x10E3/uL   Neutrophils 71 Not Estab. %   Lymphs 21 Not Estab. %   Monocytes 7 Not Estab. %   Eos 1 Not Estab. %   Basos 0 Not Estab. %   Neutrophils Absolute 4.7 1.4 - 7.0 x10E3/uL   Lymphocytes Absolute 1.4 0.7 - 3.1 x10E3/uL   Monocytes Absolute 0.5 0.1 - 0.9 x10E3/uL   EOS (ABSOLUTE) 0.0 0.0 - 0.4 x10E3/uL   Basophils Absolute 0.0 0.0 - 0.2 x10E3/uL   Immature Granulocytes 0 Not Estab. %   Immature Grans (Abs) 0.0 0.0 - 0.1 x10E3/uL  Comprehensive metabolic panel   Collection Time: 11/13/23  2:39 PM  Result Value Ref Range   Glucose 185 (H) 70 - 99 mg/dL   BUN 14 8 - 27 mg/dL   Creatinine, Ser 4.03 0.57 - 1.00 mg/dL   eGFR 62 >47 QQ/VZD/6.38   BUN/Creatinine Ratio 15 12 - 28  Sodium 141 134 - 144  mmol/L   Potassium 4.3 3.5 - 5.2 mmol/L   Chloride 105 96 - 106 mmol/L   CO2 23 20 - 29 mmol/L   Calcium  9.1 8.7 - 10.3 mg/dL   Total Protein 6.2 6.0 - 8.5 g/dL   Albumin 4.1 3.8 - 4.8 g/dL   Globulin, Total 2.1 1.5 - 4.5 g/dL   Bilirubin Total 0.4 0.0 - 1.2 mg/dL   Alkaline Phosphatase 91 44 - 121 IU/L   AST 17 0 - 40 IU/L   ALT 15 0 - 32 IU/L  Lipid Panel w/o Chol/HDL Ratio   Collection Time: 11/13/23  2:39 PM  Result Value Ref Range   Cholesterol, Total 124 100 - 199 mg/dL   Triglycerides 147 0 - 149 mg/dL   HDL 63 >82 mg/dL   VLDL Cholesterol Cal 19 5 - 40 mg/dL   LDL Chol Calc (NIH) 42 0 - 99 mg/dL   *Note: Due to a large number of results and/or encounters for the requested time period, some results have not been displayed. A complete set of results can be found in Results Review.      Assessment & Plan:   Problem List Items Addressed This Visit       Respiratory   Centrilobular emphysema (HCC) - Primary   Chronic, ongoing with more mucus. Continue Breztri , for triple therapy, is tolerating and Albuterol  as needed. Referral to pulmonary placed for further assessment.  Check Alpha 1 today. Continue annual lung screening until age 35. Will continue to collaborate with [facility] pharmacist for assistance on medications. We discussed adding Singulair  due to underlying allergies, educated on this and BLACK BOX warning.  She prefers not to start.      Relevant Orders   Alpha-1-Antitrypsin Deficiency   CBC with Differential/Platelet   Ambulatory referral to Pulmonology     Follow up plan: Return for as scheduled on August 4th for routine visit.

## 2024-03-30 ENCOUNTER — Ambulatory Visit: Payer: Self-pay | Admitting: Nurse Practitioner

## 2024-03-30 ENCOUNTER — Other Ambulatory Visit (INDEPENDENT_AMBULATORY_CARE_PROVIDER_SITE_OTHER): Payer: Self-pay | Admitting: Nurse Practitioner

## 2024-03-30 DIAGNOSIS — I714 Abdominal aortic aneurysm, without rupture, unspecified: Secondary | ICD-10-CM

## 2024-03-30 LAB — CBC WITH DIFFERENTIAL/PLATELET
Basos: 0 %
EOS (ABSOLUTE): 0.1 10*3/uL (ref 0.0–0.4)
Hematocrit: 44.1 % (ref 34.0–46.6)
Lymphs: 18 %
MCHC: 32 g/dL (ref 31.5–35.7)
Monocytes: 8 %
Platelets: 219 10*3/uL (ref 150–450)
RBC: 4.56 x10E6/uL (ref 3.77–5.28)
RDW: 12.6 % (ref 11.7–15.4)

## 2024-03-30 NOTE — Progress Notes (Signed)
 Contacted via MyChart  Blood counts all nice and normal.  Great news!!  Waiting on Alpha 1 testing to return.  Any questions? Keep being incredible!!  Thank you for allowing me to participate in your care.  I appreciate you. Kindest regards, Jovani Flury

## 2024-03-31 ENCOUNTER — Ambulatory Visit

## 2024-03-31 ENCOUNTER — Ambulatory Visit (INDEPENDENT_AMBULATORY_CARE_PROVIDER_SITE_OTHER): Payer: Medicare Other

## 2024-03-31 ENCOUNTER — Encounter (INDEPENDENT_AMBULATORY_CARE_PROVIDER_SITE_OTHER): Payer: Self-pay | Admitting: Nurse Practitioner

## 2024-03-31 ENCOUNTER — Ambulatory Visit (INDEPENDENT_AMBULATORY_CARE_PROVIDER_SITE_OTHER): Payer: Medicare Other | Admitting: Nurse Practitioner

## 2024-03-31 VITALS — BP 120/71 | HR 74 | Ht 69.0 in | Wt 200.4 lb

## 2024-03-31 DIAGNOSIS — I890 Lymphedema, not elsewhere classified: Secondary | ICD-10-CM

## 2024-03-31 DIAGNOSIS — E1169 Type 2 diabetes mellitus with other specified complication: Secondary | ICD-10-CM

## 2024-03-31 DIAGNOSIS — I7123 Aneurysm of the descending thoracic aorta, without rupture: Secondary | ICD-10-CM | POA: Diagnosis not present

## 2024-03-31 DIAGNOSIS — I714 Abdominal aortic aneurysm, without rupture, unspecified: Secondary | ICD-10-CM

## 2024-03-31 NOTE — Progress Notes (Signed)
 Subjective:    Patient ID: Sharon [name], female    DOB: 18-Aug-1946, 78 y.o.   MRN: [id number] Chief Complaint  Patient presents with   AAA    C/o leg swelling  Leg pain-3     Sharon [name] is a 78 year old female that returns to the office for surveillance of a known abdominal aortic aneurysm. Patient denies abdominal pain or back pain, no other abdominal complaints. No changes suggesting embolic episodes.    There have been no interval changes in the patient's overall health care since his last visit.  She has known lymphedema as well and her swelling is generally well controlled currently.  No recurrent episodes of cellulitis   Patient denies amaurosis fugax or TIA symptoms. There is no history of claudication or rest pain symptoms of the lower extremities. The patient denies angina or shortness of breath.   The patient has a longstanding history of lymphedema with multiple episodes of cellulitis.  She recently received a new lymphedema pump which has been working well but it is somewhat more difficult to use than her previous pump given the abdominal attachment.  She notes that she and her husband have both been sick recently and it is made it difficult to fit.  However she has been getting better and has been utilizing it more.  She also notes that she has moved to a stronger compression of 30 to 40 mmHg.  She does endorse some discomfort around her right ankle after rolling it recently and it does have a little bit more swelling.  Duplex US  of the aorta and iliac arteries shows an AAA measured 3.3.  The previous measurement on 03/26/2023 showed 3.3 cm.  Size essentially unchanged.    Review of Systems  Cardiovascular:  Positive for leg swelling.  Musculoskeletal:  Positive for arthralgias.  All other systems reviewed and are negative.      Objective:   Physical Exam Vitals reviewed.  HENT:     Head: Normocephalic.   Cardiovascular:     Rate and Rhythm: Normal rate.      Pulses: Normal pulses.  Pulmonary:     Effort: Pulmonary effort is normal.   Skin:    General: Skin is warm and dry.   Neurological:     Mental Status: She is alert and oriented to person, place, and time.   Psychiatric:        Mood and Affect: Mood normal.        Behavior: Behavior normal.        Thought Content: Thought content normal.        Judgment: Judgment normal.     BP 120/71   Pulse 74   Ht 5' 9 (1.753 m)   Wt 200 lb 6 oz (90.9 kg)   LMP  (LMP Unknown)   BMI 29.59 kg/m   Past Medical History:  Diagnosis Date   Aneurysm of descending thoracic aorta (HCC)    Anxiety    Arthritis    hands, upper back   Asthma    Cervical cancer (HCC)    in situ 1970's   CHF (congestive heart failure) (HCC)    COPD (chronic obstructive pulmonary disease) (HCC)    Coronary artery disease    COVID    GERD (gastroesophageal reflux disease)    History of cervical cancer    HLD (hyperlipidemia)    Lymphedema    Menopausal disorder    Neuropathy    Osteoporosis    Pneumonia 1960  Pre-diabetes    PVD (peripheral vascular disease) (HCC)    Spasm of abdominal muscles of right side    intermittent   Stroke (HCC) 2019   TMJ (dislocation of temporomandibular joint)    Wears dentures    partial lower    Social History   Socioeconomic History   Marital status: Married    Spouse name: Puanani Gene   Number of children: 1   Years of education: Not on file   Highest education level: Some college, no degree  Occupational History   Occupation: retired  Tobacco Use   Smoking status: Former    Current packs/day: 0.00    Average packs/day: 0.5 packs/day for 56.0 years (28.0 ttl pk-yrs)    Types: Cigarettes    Start date: 07/14/1960    Quit date: 07/14/2016    Years since quitting: 7.7   Smokeless tobacco: Never  Vaping Use   Vaping status: Never Used  Substance and Sexual Activity   Alcohol use: Yes    Comment: occassional Margarita   Drug use: No   Sexual activity:  Not Currently    Birth control/protection: Post-menopausal  Other Topics Concern   Not on file  Social History Narrative   Lives with husband, manages farm   Social Drivers of Health   Financial Resource Strain: Low Risk  (07/22/2023)   Overall Financial Resource Strain (CARDIA)    Difficulty of Paying Living Expenses: Not hard at all  Food Insecurity: No Food Insecurity (07/22/2023)   Hunger Vital Sign    Worried About Running Out of Food in the Last Year: Never true    Ran Out of Food in the Last Year: Never true  Transportation Needs: No Transportation Needs (07/22/2023)   PRAPARE - Administrator, Civil Service (Medical): No    Lack of Transportation (Non-Medical): No  Physical Activity: Insufficiently Active (07/22/2023)   Exercise Vital Sign    Days of Exercise per Week: 3 days    Minutes of Exercise per Session: 30 min  Stress: No Stress Concern Present (07/22/2023)   [employer] of Occupational Health - Occupational Stress Questionnaire    Feeling of Stress : Only a little  Social Connections: Moderately Isolated (07/22/2023)   Social Connection and Isolation Panel    Frequency of Communication with Friends and Family: Twice a week    Frequency of Social Gatherings with Friends and Family: Twice a week    Attends Religious Services: Never    Database administrator or Organizations: No    Attends Banker Meetings: Never    Marital Status: Married  Catering manager Violence: Not At Risk (07/22/2023)   Humiliation, Afraid, Rape, and Kick questionnaire    Fear of Current or Ex-Partner: No    Emotionally Abused: No    Physically Abused: No    Sexually Abused: No    Past Surgical History:  Procedure Laterality Date   ABDOMINAL HYSTERECTOMY  1970's   bladder botox  2005   BLADDER SUSPENSION  2004   CATARACT EXTRACTION W/PHACO Right 03/09/2018   Procedure: CATARACT EXTRACTION PHACO AND INTRAOCULAR LENS PLACEMENT (IOC) right;  Surgeon: Rosa College, MD;  Location: [facility];  Service: Ophthalmology;  Laterality: Right;  CALL CELL 1ST   CATARACT EXTRACTION W/PHACO Left 04/19/2018   Procedure: CATARACT EXTRACTION PHACO AND INTRAOCULAR LENS PLACEMENT (IOC)  LEFT;  Surgeon: Rosa College, MD;  Location: [facility];  Service: Ophthalmology;  Laterality: Left;   CHOLECYSTECTOMY,  LAPAROSCOPIC  02/18/2023   COLONOSCOPY WITH PROPOFOL  N/A 12/13/2015   Procedure: COLONOSCOPY WITH PROPOFOL ;  Surgeon: Marnee Sink, MD;  Location: [facility];  Service: Endoscopy;  Laterality: N/A;   COLONOSCOPY WITH PROPOFOL  N/A 03/14/2021   Procedure: COLONOSCOPY WITH PROPOFOL ;  Surgeon: Marnee Sink, MD;  Location: [facility];  Service: Endoscopy;  Laterality: N/A;  diabetic   LOOP RECORDER INSERTION N/A 01/07/2018   Procedure: LOOP RECORDER INSERTION;  Surgeon: Verona Goodwill, MD;  Location: [facility] INVASIVE CV LAB;  Service: Cardiovascular;  Laterality: N/A;   LOOP RECORDER REMOVAL     POLYPECTOMY N/A 12/13/2015   Procedure: POLYPECTOMY;  Surgeon: Marnee Sink, MD;  Location: [facility];  Service: Endoscopy;  Laterality: N/A;  SIGMOID COLON POLYPS X  5   POLYPECTOMY N/A 03/14/2021   Procedure: POLYPECTOMY;  Surgeon: Marnee Sink, MD;  Location: [facility];  Service: Endoscopy;  Laterality: N/A;   SHOULDER ARTHROSCOPY W/ ROTATOR CUFF REPAIR Right 1998   TEE WITHOUT CARDIOVERSION N/A 01/06/2018   Procedure: TRANSESOPHAGEAL ECHOCARDIOGRAM (TEE);  Surgeon: Devorah Fonder, MD;  Location: [facility] ORS;  Service: Cardiovascular;  Laterality: N/A;   TONSILLECTOMY AND ADENOIDECTOMY     TOOTH EXTRACTION     July 2023    Family History  Problem Relation Age of Onset   Diabetes Mother    Heart disease Mother    Stroke Mother    Stroke Maternal Grandmother    Hyperlipidemia Neg Hx     Allergies  Allergen Reactions   Statins Other (See Comments)    Myalgias and joint pain on atorvastatin  40mg  and  rosuvastatin  20mg    Dermacerin [Aquaphilic]    Elidel  [Pimecrolimus ]     Patient reports allergy   Nsaids    Pineapple Itching    throat   Praluent  [Alirocumab ]     Flu like symptoms, leg swelling, rash   Quinolones     FLUOROQUINOLONES - Pt reports she was told to NEVER take these.   Baclofen  Swelling   Bactrim  [Sulfamethoxazole -Trimethoprim ] Nausea And Vomiting   Ibuprofen Itching and Rash    Mouth swelling and itching   Lanolin Rash    Allergy to triamcinolone     Latex Rash    Some bandaids, some gloves; BLOOD TEST NEGATIVE   Librium [Chlordiazepoxide] Itching    Dizziness    Naprosyn [Naproxen] Rash    Mouth swelling and swelling   Other Rash    Estonia nuts - mouth swelling       Latest Ref Rng & Units 03/29/2024   11:50 AM 11/13/2023    2:39 PM 07/22/2023    2:15 PM  CBC  WBC 3.4 - 10.8 x10E3/uL 9.3  6.7  7.2   Hemoglobin 11.1 - 15.9 g/dL 65.7  84.6  96.2   Hematocrit 34.0 - 46.6 % 44.1  41.3  45.7   Platelets 150 - 450 x10E3/uL 219  191  201       CMP     Component Value Date/Time   NA 141 11/13/2023 1439   K 4.3 11/13/2023 1439   CL 105 11/13/2023 1439   CO2 23 11/13/2023 1439   GLUCOSE 185 (H) 11/13/2023 1439   GLUCOSE 127 (H) 12/15/2022 1119   BUN 14 11/13/2023 1439   CREATININE 0.95 11/13/2023 1439   CALCIUM  9.1 11/13/2023 1439   PROT 6.2 11/13/2023 1439   ALBUMIN 4.1 11/13/2023 1439   AST 17 11/13/2023 1439   ALT 15 11/13/2023 1439   ALKPHOS 91 11/13/2023 1439  BILITOT 0.4 11/13/2023 1439   GFRNONAA >60 12/15/2022 1119   GFRAA 67 07/17/2020 1509     No results found.     Assessment & Plan:   1. Abdominal aortic aneurysm (AAA) without rupture, unspecified part (HCC) No surgery or intervention at this time. The patient has an asymptomatic abdominal aortic aneurysm that is less than 4 cm in maximal diameter.  I have discussed the natural history of abdominal aortic aneurysm and the small risk of rupture for aneurysm less than 5 cm in size.   However, as these small aneurysms tend to enlarge over time, continued surveillance with ultrasound or CT scan is mandatory.  I have also discussed optimizing medical management with hypertension and lipid control and the importance of abstinence from tobacco.  The patient is also encouraged to exercise a minimum of 30 minutes 4 times a week.  Should the patient develop new onset abdominal or back pain or signs of peripheral embolization they are instructed to seek medical attention immediately and to alert the physician providing care that they have an aneurysm.  The patient voices their understanding. The patient will return in 12 months with an aortic duplex per patient preference.  2. Lymphedema The patient's lymphedema is improved and she has been doing well.  I believe that once she is able to begin using her pump on a more regular and consistent basis her swelling will maintain and she will not have any more significant issues.  3. Type 2 diabetes mellitus with obesity (HCC) Continue hypoglycemic medications as already ordered, these medications have been reviewed and there are no changes at this time.  Hgb A1C to be monitored as already arranged by primary service   4. Aneurysm of descending thoracic aorta without rupture (HCC) Also has known descending thoracic aortic aneurysm of 4 cm.  She has not had any growth over the last several years.  This will continue to be followed with her annual lung CT scan.  Current Outpatient Medications on File Prior to Visit  Medication Sig Dispense Refill   acetaminophen  (TYLENOL ) 500 MG tablet Take 500 mg by mouth every 6 (six) hours as needed.     albuterol  (VENTOLIN  HFA) 108 (90 Base) MCG/ACT inhaler INHALE 2 PUFFS BY MOUTH EVERY 6 HOURS AS NEEDED FOR WHEEZING OR SHORTNESS OF BREATH 8.5 g 3   aspirin  EC 81 MG tablet Take 81 mg by mouth 3 (three) times a week. Swallow whole. M-W-Fr     Blood Glucose Monitoring Suppl (ONETOUCH VERIO) w/Device KIT  Utilize to check blood sugar twice a day, fasting in morning with goal < 130 and then 2 hours after a meal with goal <180.  Document and bring to visits. 1 kit 0   BREZTRI  AEROSPHERE 160-9-4.8 MCG/ACT AERO INHALE 2 PUFFS BY MOUTH TWICE DAILY AS DIRECTED 10.7 g 11   cetirizine  (ZYRTEC ) 10 MG tablet Take 1 tablet (10 mg total) by mouth daily. 90 tablet 3   Cholecalciferol  (VITAMIN D3) 50 MCG (2000 UT) CAPS Take 1 capsule by mouth daily.     citalopram  (CELEXA ) 20 MG tablet Take 1 tablet (20 mg total) by mouth as needed. (Patient taking differently: Take 20 mg by mouth as needed.) 90 tablet 4   clopidogrel  (PLAVIX ) 75 MG tablet Take 1 tablet (75 mg total) by mouth daily. 90 tablet 0   Crisaborole  (EUCRISA ) 2 % OINT Apply 1 application  topically daily. Use for itching and inflammation at left leg 100 g 2   Cyanocobalamin   1000 MCG/ML KIT Inject 1mL into the muscle once a month 3 kit 3   erythromycin  ophthalmic ointment Place 1 Application into the right eye at bedtime. 30 g 0   ezetimibe  (ZETIA ) 10 MG tablet Take 1 tablet (10 mg total) by mouth daily. 90 tablet 4   furosemide  (LASIX ) 20 MG tablet Take 0.5 tablets (10 mg total) by mouth daily as needed. 90 tablet 4   glucose blood (ONETOUCH VERIO) test strip UTILIIZE TO CHECK BLOOD SUGAR TWICE DAILY FASTING IN MORNING WITH GOAL<130 AND THAN 2 HOURS AFTER A MEAL WITH GGOAL<180 100 strip 3   Lancets (ONETOUCH ULTRASOFT) lancets Utilize to check blood sugar twice a day, fasting in morning with goal < 130 and then 2 hours after a meal with goal <180.  Document and bring to visits. 100 each 12   meclizine  (ANTIVERT ) 12.5 MG tablet Take 12.5 mg by mouth 3 (three) times daily.     mometasone  (ELOCON ) 0.1 % cream Apply 1 Application topically 2 (two) times daily. As needed for flares up to two weeks only 45 g 0   pantoprazole  (PROTONIX ) 40 MG tablet TAKE 1 TABLET(40 MG) BY MOUTH DAILY 90 tablet 4   rosuvastatin  (CRESTOR ) 5 MG tablet TAKE 1 TABLET(5 MG) BY MOUTH  DAILY 90 tablet 2   Syringe/Needle, Disp, 22G X 1-1/2 1 ML MISC Use to inject b12 monthly 3 each 3   Current Facility-Administered Medications on File Prior to Visit  Medication Dose Route Frequency Provider Last Rate Last Admin   cyanocobalamin  (VITAMIN B12) injection 1,000 mcg  1,000 mcg Intramuscular Q30 days [name], Jolene T, NP   1,000 mcg at 02/29/24 1308    There are no Patient Instructions on file for this visit. No follow-ups on file.   Sharon Gibbon E Lenda Baratta, NP

## 2024-04-01 DIAGNOSIS — H02831 Dermatochalasis of right upper eyelid: Secondary | ICD-10-CM | POA: Diagnosis not present

## 2024-04-01 DIAGNOSIS — H524 Presbyopia: Secondary | ICD-10-CM | POA: Diagnosis not present

## 2024-04-01 DIAGNOSIS — H02834 Dermatochalasis of left upper eyelid: Secondary | ICD-10-CM | POA: Diagnosis not present

## 2024-04-04 LAB — CBC WITH DIFFERENTIAL/PLATELET
Basophils Absolute: 0 10*3/uL (ref 0.0–0.2)
Eos: 1 %
Hemoglobin: 14.1 g/dL (ref 11.1–15.9)
Immature Grans (Abs): 0.1 10*3/uL (ref 0.0–0.1)
Immature Granulocytes: 1 %
Lymphocytes Absolute: 1.7 10*3/uL (ref 0.7–3.1)
MCH: 30.9 pg (ref 26.6–33.0)
MCV: 97 fL (ref 79–97)
Monocytes Absolute: 0.7 10*3/uL (ref 0.1–0.9)
Neutrophils Absolute: 6.8 10*3/uL (ref 1.4–7.0)
Neutrophils: 72 %
WBC: 9.3 10*3/uL (ref 3.4–10.8)

## 2024-04-04 LAB — ALPHA-1-ANTITRYPSIN DEFICIENCY

## 2024-04-18 ENCOUNTER — Ambulatory Visit (INDEPENDENT_AMBULATORY_CARE_PROVIDER_SITE_OTHER): Admitting: Dermatology

## 2024-04-18 DIAGNOSIS — I872 Venous insufficiency (chronic) (peripheral): Secondary | ICD-10-CM | POA: Diagnosis not present

## 2024-04-18 DIAGNOSIS — Z79899 Other long term (current) drug therapy: Secondary | ICD-10-CM | POA: Diagnosis not present

## 2024-04-18 MED ORDER — CEFDINIR 300 MG PO CAPS: 300.0000 mg | ORAL_CAPSULE | Freq: Two times a day (BID) | ORAL | 0 refills | Status: AC

## 2024-04-18 MED ORDER — DESOXIMETASONE 0.05 % EX CREA: TOPICAL_CREAM | CUTANEOUS | 0 refills | Status: AC

## 2024-04-18 NOTE — Patient Instructions (Addendum)
 For stasis dermatitis rash at legs  Start Cefdnir 300 mg capsule Take 1 capsule (300 mg total) by mouth 2 (two) times daily for 7 days   Start desoximetasone  0.05 % cream -  apply daily to twice daily to affected areas of lower legs for 2 weeks. Can use for a maximum of 4 weeks if rash has not improved at 2 weeks. Avoid applying to face, groin, and axilla. Use as directed.   Topical steroids (such as triamcinolone , fluocinolone, fluocinonide, mometasone , clobetasol, halobetasol, betamethasone , hydrocortisone) can cause thinning and lightening of the skin if they are used for too long in the same area. Your physician has selected the right strength medicine for your problem and area affected on the body. Please use your medication only as directed by your physician to prevent side effects.   After rash has gone away can continue Eucrisa  2 % ointment apply topically to affected areas daily to twice daily. Can store in fridge to help decrease burning after applying  Continue Compression hose daily  Continue Lymphedema pump as needed flares  Continue Cerave Itch Relief and or Cerave Sa as needed  Continue lasix  (furosemide ) as prescribed     Due to recent changes in healthcare laws, you may see results of your pathology and/or laboratory studies on MyChart before the doctors have had a chance to review them. We understand that in some cases there may be results that are confusing or concerning to you. Please understand that not all results are received at the same time and often the doctors may need to interpret multiple results in order to provide you with the best plan of care or course of treatment. Therefore, we ask that you please give us  2 business days to thoroughly review all your results before contacting the office for clarification. Should we see a critical lab result, you will be contacted sooner.   If You Need Anything After Your Visit  If you have any questions or concerns for  your doctor, please call our main line at [phone] and press option 4 to reach your doctor's medical assistant. If no one answers, please leave a voicemail as directed and we will return your call as soon as possible. Messages left after 4 pm will be answered the following business day.   You may also send us  a message via MyChart. We typically respond to MyChart messages within 1-2 business days.  For prescription refills, please ask your pharmacy to contact our office. Our fax number is [phone].  If you have an urgent issue when the clinic is closed that cannot wait until the next business day, you can page your doctor at the number below.    Please note that while we do our best to be available for urgent issues outside of office hours, we are not available 24/7.   If you have an urgent issue and are unable to reach us , you may choose to seek medical care at your doctor's office, retail clinic, urgent care center, or emergency room.  If you have a medical emergency, please immediately call 911 or go to the emergency department.  Pager Numbers  - Dr. Hester: [phone]  - Dr. Jackquline: [phone]  - Dr. Claudene: [phone]   In the event of inclement weather, please call our main line at [phone] for an update on the status of any delays or closures.  Dermatology Medication Tips: Please keep the boxes that topical medications come in in order to help keep track of  the instructions about where and how to use these. Pharmacies typically print the medication instructions only on the boxes and not directly on the medication tubes.   If your medication is too expensive, please contact our office at [phone] option 4 or send us  a message through MyChart.   We are unable to tell what your co-pay for medications will be in advance as this is different depending on your insurance coverage. However, we may be able to find a substitute medication at lower cost or fill out  paperwork to get insurance to cover a needed medication.   If a prior authorization is required to get your medication covered by your insurance company, please allow us  1-2 business days to complete this process.  Drug prices often vary depending on where the prescription is filled and some pharmacies may offer cheaper prices.  The website www.goodrx.com contains coupons for medications through different pharmacies. The prices here do not account for what the cost may be with help from insurance (it may be cheaper with your insurance), but the website can give you the price if you did not use any insurance.  - You can print the associated coupon and take it with your prescription to the pharmacy.  - You may also stop by our office during regular business hours and pick up a GoodRx coupon card.  - If you need your prescription sent electronically to a different pharmacy, notify our office through [facility] or by phone at [phone] option 4.     Si Usted Necesita Algo Despus de Su Visita  Tambin puede enviarnos un mensaje a travs de Clinical cytogeneticist. Por lo general respondemos a los mensajes de MyChart en el transcurso de 1 a 2 das hbiles.  Para renovar recetas, por favor pida a su farmacia que se ponga en contacto con nuestra oficina. Randi lakes de fax es [place] [phone].  Si tiene un asunto urgente cuando la clnica est cerrada y que no puede esperar hasta el siguiente da hbil, puede llamar/localizar a su doctor(a) al nmero que aparece a continuacin.   Por favor, tenga en cuenta que aunque hacemos todo lo posible para estar disponibles para asuntos urgentes fuera del horario de [place], no estamos disponibles las 24 horas del da, los 7 [address]  [address] de la [place].   Si tiene un problema urgente y no puede comunicarse con nosotros, puede optar por buscar atencin mdica  en el consultorio de su doctor(a), en una clnica privada, en un centro de atencin urgente o en una sala de  emergencias.  Si tiene Engineer, drilling, por favor llame inmediatamente al 911 o vaya a la sala de emergencias.  Nmeros de bper  - Dr. Hester: [phone]  - Dra. Jackquline: [phone]  - Dr. Claudene: [phone]   En caso de inclemencias del tiempo, por favor llame a landry capes principal al [phone] para una actualizacin sobre el [place] de cualquier retraso o cierre.  Consejos para la medicacin en dermatologa: Por favor, guarde las cajas en las que vienen los medicamentos de uso tpico para ayudarle a seguir las instrucciones sobre dnde y cmo usarlos. Las farmacias generalmente imprimen las instrucciones del medicamento slo en las cajas y no directamente en los tubos del [place].   Si su medicamento es muy caro, por favor, pngase en contacto con landry rieger llamando al [phone] y presione la opcin 4 o envenos un mensaje a travs de Clinical cytogeneticist.   No podemos decirle cul ser su copago por los medicamentos por  adelantado ya que esto es diferente dependiendo de la cobertura de su seguro. Sin embargo, es posible que podamos encontrar un medicamento sustituto a Audiological scientist un formulario para que el seguro cubra el medicamento que se considera necesario.   Si se requiere una autorizacin previa para que su compaa de seguros malta su medicamento, por favor permtanos de 1 a 2 das hbiles para completar este proceso.  Los precios de los medicamentos varan con frecuencia dependiendo del Environmental consultant de dnde se surte la receta y alguna farmacias pueden ofrecer precios ms baratos.  El sitio web www.goodrx.com tiene cupones para medicamentos de Health and safety inspector. Los precios aqu no tienen en cuenta lo que podra costar con la ayuda del seguro (puede ser ms barato con su seguro), pero el sitio web puede darle el precio si no utiliz Tourist information centre manager.  - Puede imprimir el cupn correspondiente y llevarlo con su receta a la farmacia.  - Tambin puede pasar por  nuestra oficina durante el horario de atencin regular y Education officer, museum una tarjeta de cupones de GoodRx.  - Si necesita que su receta se enve electrnicamente a una farmacia diferente, informe a nuestra oficina a travs de MyChart de [place] o por telfono llamando al [phone] y presione la opcin 4.

## 2024-04-18 NOTE — Progress Notes (Signed)
 Follow-Up Visit   Subjective  Sharon [name] is a 78 y.o. female who presents for the following: patient has history of stasis dermatitis at lower legs, patient reports flared for about a month, has been using lymphedema pump but stopped for about a week when she was sick, and legs flared after that, eucrisa  2 % ointment , compression hose daily. She is also continuing her lasix  diuretic. Pt has done well in the past taking Cefdinir  and using desoximethasone cream.  She has worsening with Opzelura and TMC cream   The following portions of the chart were reviewed this encounter and updated as appropriate: medications, allergies, medical history  Review of Systems:  No other skin or systemic complaints except as noted in HPI or Assessment and Plan.  Objective  Well appearing patient in no apparent distress; mood and affect are within normal limits.   A focused examination was performed of the following areas: Lower legs b/l   Relevant exam findings are noted in the Assessment and Plan.    Assessment & Plan   STASIS DERMATITIS Exam:  Erythema and edema at left> right lower leg with 2 + pitting edema   Chronic and persistent condition with duration or expected duration over one year. Condition is bothersome/symptomatic for patient. Currently flared.   Stasis in the legs causes chronic leg swelling, which may result in itchy or painful rashes, skin discoloration, skin texture changes, and sometimes ulceration.  Recommend daily graduated compression hose/stockings- easiest to put on first thing in morning, remove at bedtime.  Elevate legs as much as possible. Avoid salt/sodium rich foods.  Treatment Plan:  Start Cefdnir 300 mg capsule Take 1 capsule (300 mg total) by mouth 2 (two) times daily for 7 days   Start desoximetasone  0.05 % cream -  apply daily to twice daily to affected areas of lower legs for 2 weeks. Can use for a maximum of 4 weeks if rash has not improved at 2  weeks. Avoid applying to face, groin, and axilla. Use as directed.   Topical steroids (such as triamcinolone , fluocinolone, fluocinonide, mometasone , clobetasol, halobetasol, betamethasone , hydrocortisone) can cause thinning and lightening of the skin if they are used for too long in the same area. Your physician has selected the right strength medicine for your problem and area affected on the body. Please use your medication only as directed by your physician to prevent side effects.   After rash has gone away can continue Eucrisa  2 % ointment apply topically to affected areas daily to twice daily. Can store in fridge to help decrease burning after applying  Continue Compression hose daily, put on in AM, remove at night  Continue Lymphedema pump daily  Continue Cerave Itch Relief and or Cerave Sa as needed  Continue lasix  (furosemide ) as prescribed  STASIS DERMATITIS OF BOTH LEGS   Related Medications desoximetasone  (TOPICORT ) 0.05 % cream Apply qd/bid daily to affected areas of lower legs for  2 weeks. Can use for a max of 4 weeks if rash has not improved at 2 weeks Avoid applying to face, groin, and axilla. Use as directed. cefdinir  (OMNICEF ) 300 MG capsule Take 1 capsule (300 mg total) by mouth 2 (two) times daily for 7 days.  Return for 4 - 6 week statsis dermatitis follow up.  I, Eleanor Blush, CMA, am acting as scribe for Rexene Rattler, MD.   Documentation: I have reviewed the above documentation for accuracy and completeness, and I agree with the above.  Rexene Rattler,  MD

## 2024-04-22 ENCOUNTER — Other Ambulatory Visit: Payer: Self-pay | Admitting: Nurse Practitioner

## 2024-04-22 ENCOUNTER — Encounter: Payer: Self-pay | Admitting: Physician Assistant

## 2024-04-22 ENCOUNTER — Ambulatory Visit: Attending: Physician Assistant | Admitting: Physician Assistant

## 2024-04-22 VITALS — BP 132/68 | HR 75 | Ht 69.0 in | Wt 203.0 lb

## 2024-04-22 DIAGNOSIS — E785 Hyperlipidemia, unspecified: Secondary | ICD-10-CM | POA: Diagnosis not present

## 2024-04-22 DIAGNOSIS — I5189 Other ill-defined heart diseases: Secondary | ICD-10-CM | POA: Diagnosis not present

## 2024-04-22 DIAGNOSIS — I77819 Aortic ectasia, unspecified site: Secondary | ICD-10-CM

## 2024-04-22 DIAGNOSIS — I2510 Atherosclerotic heart disease of native coronary artery without angina pectoris: Secondary | ICD-10-CM

## 2024-04-22 DIAGNOSIS — I639 Cerebral infarction, unspecified: Secondary | ICD-10-CM | POA: Diagnosis not present

## 2024-04-22 DIAGNOSIS — I890 Lymphedema, not elsewhere classified: Secondary | ICD-10-CM

## 2024-04-22 DIAGNOSIS — I700 Atherosclerosis of aorta: Secondary | ICD-10-CM | POA: Diagnosis not present

## 2024-04-22 DIAGNOSIS — I714 Abdominal aortic aneurysm, without rupture, unspecified: Secondary | ICD-10-CM

## 2024-04-22 MED ORDER — CLOPIDOGREL BISULFATE 75 MG PO TABS: 75.0000 mg | ORAL_TABLET | Freq: Every day | ORAL | 3 refills | Status: AC

## 2024-04-22 NOTE — Patient Instructions (Signed)
 Medication Instructions:  Your physician recommends that you continue on your current medications as directed. Please refer to the Current Medication list given to you today.    *If you need a refill on your cardiac medications before your next appointment, please call your pharmacy*  Lab Work: No labs ordered today    Testing/Procedures: No test ordered today   Follow-Up: At [facility], you and your health needs are our priority.  As part of our continuing mission to provide you with exceptional heart care, our providers are all part of one team.  This team includes your primary Cardiologist (physician) and Advanced Practice Providers or APPs (Physician Assistants and Nurse Practitioners) who all work together to provide you with the care you need, when you need it.  Your next appointment:   6 month(s)  Provider:   You may see Timothy Gollan, MD or one of the following Advanced Practice Providers on your designated Care Team:   Lonni Meager, NP Lesley Maffucci, PA-C Bernardino Bring, PA-C Cadence Chauncey, PA-C Tylene Lunch, NP Barnie Hila, NP

## 2024-04-22 NOTE — Progress Notes (Signed)
 Cardiology Office Note    Date:  04/22/2024   ID:  KRYSTINE PABST, DOB 24-Sep-1946, MRN [id number]  PCP:  Valerio Melanie DASEN, NP  Cardiologist:  Evalene Lunger, MD  Electrophysiologist:  Elspeth Sage, MD   Chief Complaint: Follow up  History of Present Illness:   Sharon [name] is a 78 y.o. 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   She was admitted to the hospital in 12/2017 with slurred speech and weakness involving the left hand with associated facial droop.  She was found to have multiple right cerebral infarcts concerning for embolic phenomena.  Carotid artery ultrasound showed minor carotid artery atherosclerosis with no hemodynamically significant disease.  Surface echo showed an EF of 70% with no regional wall motion abnormalities.  TEE showed an EF of 55 to 65%, no regional wall motion abnormalities, no left atrial appendage thrombus, and no cardiac source of emboli identified with a normal sized aortic root, ascending thoracic aorta, aortic arch, and descending thoracic aorta.  She subsequently underwent ILR implantation with no evidence of A. fib identified.  Echo from 09/2018 showed an EF of 55 to 60%, no regional wall motion abnormalities, grade 1 diastolic dysfunction, upper limit of normal PASP, no significant valvular abnormalities, and a small pericardial effusion identified posterior to the heart.  She was seen in the office in 02/2021 for pre-procedure cardiac risk stratification and was doing well from a cardiac perspective.  She was referred to the lipid clinic for further  management of her hyperlipidemia, and was started on Praluent .  However, this has been discontinued secondary to injection site reaction and influenza-like symptoms.   She was admitted to the hospital in 09/2021 with contact/stasis dermatitis of the left lower extremity.  Venous Doppler was negative for DVT.  Initially, she was treated with antibiotics, though these were discontinued per ID recommendation.  She had notable improvement following initiation of steroid for contact dermatitis.     She was seen in 10/2021 noting improvement in her stasis dermatitis.  With regards to her history of CVA, ILR showed no evidence of A-fib with loop recorder being explanted in 12/2021.   Echo in 12/2021 demonstrated an EF of 55 to 60%, no regional wall motion abnormalities, grade 2 diastolic dysfunction, normal RV systolic function and ventricular cavity size, trivial mitral regurgitation, and an estimated right atrial pressure of 3 mmHg.    Chest CT 07/2022 showed stable aneurysmal dilatation of the descending thoracic aorta measuring 4 cm.   She was seen in the ED in 06/2022 left left arm tingling and numbness with radiation to her face.  CT head showed no acute intracranial hemorrhage or evidence of infarct.  MRI of the brain showed no acute intracranial abnormality.  MRA of the head/neck showed no major intracranial arterial occlusion or significant proximal stenosis with severe left vertebral artery origin stenosis and widely patent cervical carotid arteries.  She was admitted later in 06/2022 with slurred speech with CT of the head showing focal hypodensity involving the right insular cortex suspicious for an evolving acute right MCA distribution infarct.  MRI of the brain showed an acute  ischemic right MCA territory infarct without associated hemorrhage or mass effect.  MRA of the head/neck showed interval right M2 branch occlusion consistent with right MCA territory infarct with wide patency of the carotid artery  systems.  The previously noted stenosis at the origin of the left vertebral artery was more mild to moderate in appearance.  Echo during that admission showed an EF of 55 to 60%, no regional wall motion abnormalities, grade 1 diastolic dysfunction, normal RV systolic function and ventricular cavity size, aortic valve sclerosis without evidence of stenosis, and an estimated right atrial pressure of 3 mmHg.  She was seen in the ED in 10/2022 with dizziness and weakness.  CT of the head showed no evidence of hemorrhage or acute infarct.  MRI of the brain showed no acute intracranial process.  She was referred to EP for consideration of reimplantation of loop recorder given recurrent cryptogenic stroke with no further plans for EP evaluation.  She was referred to the lipid clinic given documented intolerance to atorvastatin , rosuvastatin , and Praluent .  Insurance would not cover Leqvio.  She was rechallenged on low-dose rosuvastatin , though was concerned this may have been contributing to headache.   AAA ultrasound in 03/2024 demonstrated the largest aortic measurement of 3.3 cm which was unchanged when compared to study from 03/2023 and 02/2022.    She was last seen in the office in 09/2023 and was doing well from a cardiac perspective.  No changes in pharmacotherapy were indicated at that time.  She comes in doing well from a cardiac perspective and remains without symptoms of angina or cardiac decompensation.  No dizziness, presyncope, or syncope.  Wearing compression hose.  No progressive orthopnea.  No falls, hematochezia, or melena.  No new stroke deficits.  Blood pressure remains well-controlled at home.  Overall feels like she is doing well from a cardiac perspective and does not have any acute cardiac concerns at this time.   Labs independently reviewed: 03/2024 - Hgb 14.1, PLT 219 10/2023 - TC 124, TG 106, HDL 63, LDL 42, BUN 14, serum creatinine 0.95, potassium 4.3, albumin 4.1, AST/ALT normal, A1c  6.2 10/2022 - magnesium 2.0 01/2023 - TSH normal  Past Medical History:  Diagnosis Date   Aneurysm of descending thoracic aorta (HCC)    Anxiety    Arthritis    hands, upper back   Asthma    Cervical cancer (HCC)    in situ 1970's   CHF (congestive heart failure) (HCC)    COPD (chronic obstructive pulmonary disease) (HCC)    Coronary artery disease    COVID    GERD (gastroesophageal reflux disease)    History of cervical cancer    HLD (hyperlipidemia)    Lymphedema    Menopausal disorder    Neuropathy    Osteoporosis    Pneumonia 1960   Pre-diabetes    PVD (peripheral vascular disease) (HCC)    Spasm of abdominal muscles of right side    intermittent   Stroke (HCC) 2019   TMJ (dislocation of temporomandibular joint)    Wears dentures    partial lower    Past Surgical History:  Procedure Laterality Date   ABDOMINAL HYSTERECTOMY  1970's   bladder botox  2005   BLADDER SUSPENSION  2004   CATARACT EXTRACTION W/PHACO Right 03/09/2018   Procedure: CATARACT EXTRACTION PHACO AND INTRAOCULAR LENS PLACEMENT (IOC) right;  Surgeon: Myrna Adine Anes, MD;  Location: [facility];  Service: Ophthalmology;  Laterality: Right;  CALL CELL 1ST  CATARACT EXTRACTION W/PHACO Left 04/19/2018   Procedure: CATARACT EXTRACTION PHACO AND INTRAOCULAR LENS PLACEMENT (IOC)  LEFT;  Surgeon: Myrna Adine Anes, MD;  Location: [facility];  Service: Ophthalmology;  Laterality: Left;   CHOLECYSTECTOMY, LAPAROSCOPIC  02/18/2023   COLONOSCOPY WITH PROPOFOL  N/A 12/13/2015   Procedure: COLONOSCOPY WITH PROPOFOL ;  Surgeon: Rogelia Copping, MD;  Location: [facility];  Service: Endoscopy;  Laterality: N/A;   COLONOSCOPY WITH PROPOFOL  N/A 03/14/2021   Procedure: COLONOSCOPY WITH PROPOFOL ;  Surgeon: Copping Rogelia, MD;  Location: [facility];  Service: Endoscopy;  Laterality: N/A;  diabetic   LOOP RECORDER INSERTION N/A 01/07/2018   Procedure: LOOP RECORDER INSERTION;  Surgeon:  Fernande Elspeth BROCKS, MD;  Location: [facility] INVASIVE CV LAB;  Service: Cardiovascular;  Laterality: N/A;   LOOP RECORDER REMOVAL     POLYPECTOMY N/A 12/13/2015   Procedure: POLYPECTOMY;  Surgeon: Rogelia Copping, MD;  Location: [facility];  Service: Endoscopy;  Laterality: N/A;  SIGMOID COLON POLYPS X  5   POLYPECTOMY N/A 03/14/2021   Procedure: POLYPECTOMY;  Surgeon: Copping Rogelia, MD;  Location: [facility];  Service: Endoscopy;  Laterality: N/A;   SHOULDER ARTHROSCOPY W/ ROTATOR CUFF REPAIR Right 1998   TEE WITHOUT CARDIOVERSION N/A 01/06/2018   Procedure: TRANSESOPHAGEAL ECHOCARDIOGRAM (TEE);  Surgeon: Gollan, Timothy J, MD;  Location: [facility] ORS;  Service: Cardiovascular;  Laterality: N/A;   TONSILLECTOMY AND ADENOIDECTOMY     TOOTH EXTRACTION     July 2023    Current Medications: Current Meds  Medication Sig   acetaminophen  (TYLENOL ) 500 MG tablet Take 500 mg by mouth every 6 (six) hours as needed.   albuterol  (VENTOLIN  HFA) 108 (90 Base) MCG/ACT inhaler INHALE 2 PUFFS BY MOUTH EVERY 6 HOURS AS NEEDED FOR WHEEZING OR SHORTNESS OF BREATH   aspirin  EC 81 MG tablet Take 81 mg by mouth 3 (three) times a week. Swallow whole. M-W-Fr   Blood Glucose Monitoring Suppl (ONETOUCH VERIO) w/Device KIT Utilize to check blood sugar twice a day, fasting in morning with goal < 130 and then 2 hours after a meal with goal <180.  Document and bring to visits.   BREZTRI  AEROSPHERE 160-9-4.8 MCG/ACT AERO INHALE 2 PUFFS BY MOUTH TWICE DAILY AS DIRECTED   cefdinir  (OMNICEF ) 300 MG capsule Take 1 capsule (300 mg total) by mouth 2 (two) times daily for 7 days.   cetirizine  (ZYRTEC ) 10 MG tablet Take 1 tablet (10 mg total) by mouth daily.   Cholecalciferol  (VITAMIN D3) 50 MCG (2000 UT) CAPS Take 1 capsule by mouth daily.   citalopram  (CELEXA ) 20 MG tablet Take 1 tablet (20 mg total) by mouth as needed. (Patient taking differently: Take 20 mg by mouth as needed.)   Crisaborole  (EUCRISA ) 2 % OINT Apply 1  application  topically daily. Use for itching and inflammation at left leg   Cyanocobalamin  1000 MCG/ML KIT Inject 1mL into the muscle once a month   desoximetasone  (TOPICORT ) 0.05 % cream Apply qd/bid daily to affected areas of lower legs for  2 weeks. Can use for a max of 4 weeks if rash has not improved at 2 weeks Avoid applying to face, groin, and axilla. Use as directed.   ezetimibe  (ZETIA ) 10 MG tablet Take 1 tablet (10 mg total) by mouth daily.   furosemide  (LASIX ) 20 MG tablet Take 0.5 tablets (10 mg total) by mouth daily as needed.   glucose blood (ONETOUCH VERIO) test strip UTILIIZE TO CHECK BLOOD SUGAR TWICE DAILY FASTING IN MORNING WITH  GOAL<130 AND THAN 2 HOURS AFTER A MEAL WITH GGOAL<180   Lancets (ONETOUCH ULTRASOFT) lancets Utilize to check blood sugar twice a day, fasting in morning with goal < 130 and then 2 hours after a meal with goal <180.  Document and bring to visits.   meclizine  (ANTIVERT ) 12.5 MG tablet Take 12.5 mg by mouth 3 (three) times daily. (Patient taking differently: Take 12.5 mg by mouth as needed.)   mometasone  (ELOCON ) 0.1 % cream Apply 1 Application topically 2 (two) times daily. As needed for flares up to two weeks only   pantoprazole  (PROTONIX ) 40 MG tablet TAKE 1 TABLET(40 MG) BY MOUTH DAILY   rosuvastatin  (CRESTOR ) 5 MG tablet TAKE 1 TABLET(5 MG) BY MOUTH DAILY   Syringe/Needle, Disp, 22G X 1-1/2 1 ML MISC Use to inject b12 monthly   [DISCONTINUED] clopidogrel  (PLAVIX ) 75 MG tablet Take 1 tablet (75 mg total) by mouth daily.   Current Facility-Administered Medications for the 04/22/24 encounter (Office Visit) with Abigail Bernardino HERO, PA-C  Medication   cyanocobalamin  (VITAMIN B12) injection 1,000 mcg    Allergies:   Statins, Dermacerin [aquaphilic], Elidel  [pimecrolimus ], Nsaids, Pineapple, Praluent  [alirocumab ], Quinolones, Baclofen , Bactrim  [sulfamethoxazole -trimethoprim ], Ibuprofen, Lanolin, Latex, Librium [chlordiazepoxide], Naprosyn [naproxen], and Other    Social History   Socioeconomic History   Marital status: Married    Spouse name: Asley Baskerville   Number of children: 1   Years of education: Not on file   Highest education level: Some college, no degree  Occupational History   Occupation: retired  Tobacco Use   Smoking status: Former    Current packs/day: 0.00    Average packs/day: 0.5 packs/day for 56.0 years (28.0 ttl pk-yrs)    Types: Cigarettes    Start date: 07/14/1960    Quit date: 07/14/2016    Years since quitting: 7.7   Smokeless tobacco: Never  Vaping Use   Vaping status: Never Used  Substance and Sexual Activity   Alcohol use: Yes    Comment: occassional Margarita   Drug use: No   Sexual activity: Not Currently    Birth control/protection: Post-menopausal  Other Topics Concern   Not on file  Social History Narrative   Lives with husband, manages farm   Social Drivers of Health   Financial Resource Strain: Low Risk  (07/22/2023)   Overall Financial Resource Strain (CARDIA)    Difficulty of Paying Living Expenses: Not hard at all  Food Insecurity: No Food Insecurity (07/22/2023)   Hunger Vital Sign    Worried About Running Out of Food in the Last Year: Never true    Ran Out of Food in the Last Year: Never true  Transportation Needs: No Transportation Needs (07/22/2023)   PRAPARE - Administrator, Civil Service (Medical): No    Lack of Transportation (Non-Medical): No  Physical Activity: Insufficiently Active (07/22/2023)   Exercise Vital Sign    Days of Exercise per Week: 3 days    Minutes of Exercise per Session: 30 min  Stress: No Stress Concern Present (07/22/2023)   [employer] of Occupational Health - Occupational Stress Questionnaire    Feeling of Stress : Only a little  Social Connections: Moderately Isolated (07/22/2023)   Social Connection and Isolation Panel    Frequency of Communication with Friends and Family: Twice a week    Frequency of Social Gatherings with Friends and  Family: Twice a week    Attends Religious Services: Never    Database administrator or Organizations: No  Attends Banker Meetings: Never    Marital Status: Married     Family History:  The patient's family history includes Diabetes in her mother; Heart disease in her mother; Stroke in her maternal grandmother and mother. There is no history of Hyperlipidemia.  ROS:   12-point review of systems is negative unless otherwise noted in the HPI.   EKGs/Labs/Other Studies Reviewed:    Studies reviewed were summarized above. The additional studies were reviewed today:  2D echo 07/03/2022: 1. Left ventricular ejection fraction, by estimation, is 55 to 60%. The  left ventricle has normal function. The left ventricle has no regional  wall motion abnormalities. Left ventricular diastolic parameters are  consistent with Grade I diastolic  dysfunction (impaired relaxation).   2. Right ventricular systolic function is normal. The right ventricular  size is normal.   3. The mitral valve is normal in structure. No evidence of mitral valve  regurgitation. No evidence of mitral stenosis.   4. The aortic valve is normal in structure. There is mild calcification  of the aortic valve. Aortic valve regurgitation is not visualized. Aortic  valve sclerosis/calcification is present, without any evidence of aortic  stenosis.   5. The inferior vena cava is normal in size with greater than 50%  respiratory variability, suggesting right atrial pressure of 3 mmHg.  __________   AAA ultrasound 02/27/2022 (vascular surgery): Summary:  Abdominal Aorta: The largest aortic measurement is 3.3 cm. Mild dilitation  again seen to the proximal abdominal aorta with normal tapering distally  to the distal aorta. Normal caliber CIA's bilaterally. Normal flow  throughout. The largest aortic diameter   remains essentially unchanged compared to prior exam. Previous diameter  measurement was 3.2 cm obtained  on 08/2021.  __________   2D echo 12/25/2021: 1. Left ventricular ejection fraction, by estimation, is 55 to 60%. The  left ventricle has normal function. The left ventricle has no regional  wall motion abnormalities. Left ventricular diastolic parameters are  consistent with Grade II diastolic  dysfunction (pseudonormalization).   2. Right ventricular systolic function is normal. The right ventricular  size is normal.   3. The mitral valve is normal in structure. Trivial mitral valve  regurgitation.   4. The aortic valve was not well visualized. Aortic valve regurgitation  is not visualized.   5. The inferior vena cava is normal in size with greater than 50%  respiratory variability, suggesting right atrial pressure of 3 mmHg. __________   2D echo 09/2018: - Left ventricle: The cavity size was normal. There was mild    concentric hypertrophy. Systolic function was normal. The    estimated ejection fraction was in the range of 55% to 60%. Wall    motion was normal; there were no regional wall motion    abnormalities. Doppler parameters are consistent with abnormal    left ventricular relaxation (grade 1 diastolic dysfunction).  - Pulmonary arteries: Systolic pressure was at the upper limits of    normal.  - Pericardium, extracardiac: A small pericardial effusion was    identified posterior to the heart. __________   TEE 12/2017: - Left ventricle: Systolic function was normal. The estimated    ejection fraction was in the range of 55% to 65%. Wall motion was    normal; there were no regional wall motion abnormalities.  - Left atrium: No evidence of thrombus in the atrial cavity or    appendage. No evidence of thrombus in the atrial cavity or    appendage.  -  Right ventricle: Systolic function was normal.  - Right atrium: No evidence of thrombus in the atrial cavity or    appendage.  - Atrial septum: No defect or patent foramen ovale was identified.    Echo contrast study  showed no right-to-left atrial level shunt,    at baseline or with provocation.   Impressions:   - No cardiac source of emboli was indentified.  __________   2D echo 12/2017: - Procedure narrative: Transthoracic echocardiography. Image    quality was suboptimal. The study was technically difficult.  - Left ventricle: The cavity size was normal. Systolic function was    vigorous. The estimated ejection fraction was 70%. Wall motion    was normal; there were no regional wall motion abnormalities.  - Aortic valve: Valve area (Vmax): 2.92 cm^2.   Impressions:   - No cardiac source of emboli was indentified.   EKG:  EKG is ordered today.  The EKG ordered today demonstrates NSR, 75 bpm, rare PAC, no acute ST-T changes  Recent Labs: 07/22/2023: Magnesium 2.0 11/13/2023: ALT 15; BUN 14; Creatinine, Ser 0.95; Potassium 4.3; Sodium 141 03/29/2024: Hemoglobin 14.1; Platelets 219  Recent Lipid Panel    Component Value Date/Time   CHOL 124 11/13/2023 1439   TRIG 106 11/13/2023 1439   HDL 63 11/13/2023 1439   CHOLHDL 3.6 07/03/2022 0609   VLDL 14 07/03/2022 0609   LDLCALC 42 11/13/2023 1439   LDLDIRECT 142.3 (H) 03/21/2021 1024    PHYSICAL EXAM:    VS:  BP 132/68 (BP Location: Left Arm, Patient Position: Sitting, Cuff Size: Normal)   Pulse 75   Ht 5' 9 (1.753 m)   Wt 203 lb (92.1 kg)   LMP  (LMP Unknown)   SpO2 98%   BMI 29.98 kg/m   BMI: Body mass index is 29.98 kg/m.  Physical Exam Vitals reviewed.  Constitutional:      Appearance: She is well-developed.  HENT:     Head: Normocephalic and atraumatic.  Eyes:     General:        Right eye: No discharge.        Left eye: No discharge.  Cardiovascular:     Rate and Rhythm: Normal rate and regular rhythm.     Heart sounds: Normal heart sounds, S1 normal and S2 normal. Heart sounds not distant. No midsystolic click and no opening snap. No murmur heard.    No friction rub.  Pulmonary:     Effort: Pulmonary effort is  normal. No respiratory distress.     Breath sounds: Normal breath sounds. No decreased breath sounds, wheezing, rhonchi or rales.  Chest:     Chest wall: No tenderness.  Musculoskeletal:     Cervical back: Normal range of motion.     Comments: Bilateral compression socks.  Skin:    General: Skin is warm and dry.     Nails: There is no clubbing.  Neurological:     Mental Status: She is alert and oriented to person, place, and time.  Psychiatric:        Speech: Speech normal.        Behavior: Behavior normal.        Thought Content: Thought content normal.        Judgment: Judgment normal.     Wt Readings from Last 3 Encounters:  04/22/24 203 lb (92.1 kg)  03/31/24 200 lb 6 oz (90.9 kg)  03/29/24 201 lb (91.2 kg)     ASSESSMENT & PLAN:  Coronary artery calcification/aortic atherosclerosis/HLD: She is doing well without symptoms suggestive of angina or cardiac decompensation.  She remains on aspirin  81 mg, rosuvastatin  5 mg, and ezetimibe  10 mg for primary prevention.  She does not tolerate higher doses of statin.  LDL at goal.  No indication for ischemic testing at this time.  Dilated ascending thoracic aorta: Stable, measuring 4.0 cm by CT chest in 08/2023 with recommendation to follow-up imaging in 08/2024 during lung cancer screening CT chest.  Continue optimal blood pressure control.  AAA: Stable, measuring 3.3 cm on ultrasound in 03/2024.  Continue optimal blood pressure control.  Avoid fluoroquinolones.  Followed by vascular surgery.   History of recurrent cryptogenic CVA: Prior loop recorder implanted in 2019 through 10/2021 showed no evidence of A-fib. Recurrent CVA involving the right MCA territory in 06/2022. She was reevaluated by EP without recommendation for further monitoring.  Followed by neurology with recommendation to take clopidogrel  75 mg daily and aspirin  81 mg Monday, Wednesday, and Friday.  No new deficits.  Diastolic dysfunction: Euvolemic and well compensated.   Not requiring loop diuretic.  Lymphedema: Continue with compression stockings and leg elevation.  Followed by vascular surgery.     Disposition: F/u with Dr. Gollan or an APP in 6 months, and EP as directed.    Medication Adjustments/Labs and Tests Ordered: Current medicines are reviewed at length with the patient today.  Concerns regarding medicines are outlined above. Medication changes, Labs and Tests ordered today are summarized above and listed in the Patient Instructions accessible in Encounters.   Signed, Bernardino Bring, PA-C 04/22/2024 5:31 PM     [facility] [place] [address] [place], [place] [address] [phone]

## 2024-04-25 ENCOUNTER — Encounter: Payer: Self-pay | Admitting: Nurse Practitioner

## 2024-04-25 MED ORDER — LORATADINE 10 MG PO TABS: 10.0000 mg | ORAL_TABLET | Freq: Every day | ORAL | 11 refills | Status: DC

## 2024-04-25 NOTE — Telephone Encounter (Signed)
 Unable to refill per protocol, Rx discontinued 04/10/23, change in therapy.  Requested Prescriptions  Pending Prescriptions Disp Refills   loratadine  (CLARITIN ) 10 MG tablet [Pharmacy Med Name: ALLERGY RELF (LORATADINE ) 10MG  TABS] 90 tablet 4    Sig: TAKE 1 TABLET(10 MG) BY MOUTH AT BEDTIME     Ear, Nose, and Throat:  Antihistamines 2 Passed - 04/25/2024  8:23 AM      Passed - Cr in normal range and within 360 days    Creatinine, Ser  Date Value Ref Range Status  11/13/2023 0.95 0.57 - 1.00 mg/dL Final         Passed - Valid encounter within last 12 months    Recent Outpatient Visits           3 weeks ago Centrilobular emphysema (HCC)   [place] [facility] Sl[place], [place] T, NP   1 month ago COPD exacerbation [facility])   [place] [facility] [place] SQUIBB, MD   2 months ago Centrilobular emphysema [facility])   [place] [facility] [place], Melanie T, NP   2 months ago Centrilobular emphysema (HCC)   [place] [facility] [place], Melanie T, NP   2 months ago Centrilobular emphysema [facility])   [place] [facility] [place], Melanie DASEN, NP       Future Appointments             In 3 weeks [name], Jolene T, NP [place] [facility]   In 1 month Jackquline Sawyer, MD [facility]

## 2024-04-29 ENCOUNTER — Other Ambulatory Visit: Payer: Self-pay | Admitting: Nurse Practitioner

## 2024-04-29 ENCOUNTER — Ambulatory Visit

## 2024-04-29 DIAGNOSIS — E538 Deficiency of other specified B group vitamins: Secondary | ICD-10-CM | POA: Diagnosis not present

## 2024-04-29 NOTE — Progress Notes (Signed)
 Patient is in office today for a nurse visit for B12 Injection. Patient Injection was given in the  Left deltoid. Patient tolerated injection well.

## 2024-05-02 NOTE — Telephone Encounter (Signed)
 Requested Prescriptions  Pending Prescriptions Disp Refills   albuterol  (VENTOLIN  HFA) 108 (90 Base) MCG/ACT inhaler [Pharmacy Med Name: ALBUTEROL  HFA INH (200 PUFFS) 8.5GM] 8 g 1    Sig: INHALE 2 PUFFS BY MOUTH EVERY 6 HOURS AS NEEDED FOR WHEEZING OR SHORTNESS OF BREATH     Pulmonology:  Beta Agonists 2 Passed - 05/02/2024 11:16 AM      Passed - Last BP in normal range    BP Readings from Last 1 Encounters:  04/22/24 132/68         Passed - Last Heart Rate in normal range    Pulse Readings from Last 1 Encounters:  04/22/24 75         Passed - Valid encounter within last 12 months    Recent Outpatient Visits           1 month ago Centrilobular emphysema (HCC)   [place] [facility] Chamberino, Higden T, NP   1 month ago COPD exacerbation [facility])   [place] [facility] Herold Hadassah SQUIBB, MD   2 months ago Centrilobular emphysema [facility])   [place] [facility] [place], Melanie T, NP   2 months ago Centrilobular emphysema (HCC)   [place] [facility] Gerrard, Melanie T, NP   3 months ago Centrilobular emphysema [facility])   [place] [facility] Gan[place], Melanie DASEN, NP       Future Appointments             In 2 weeks [name], Jolene T, NP  [facility], [facility]   In 4 weeks Jackquline Sawyer, MD [facility]

## 2024-05-03 ENCOUNTER — Ambulatory Visit

## 2024-05-04 ENCOUNTER — Encounter: Payer: Self-pay | Admitting: Cardiovascular Disease

## 2024-05-13 NOTE — Patient Instructions (Signed)
 Be Involved in Caring For Your Health:  Taking Medications When medications are taken as directed, they can greatly improve your health. But if they are not taken as prescribed, they may not work. In some cases, not taking them correctly can be harmful. To help ensure your treatment remains effective and safe, understand your medications and how to take them. Bring your medications to each visit for review by your provider.  Your lab results, notes, and after visit summary will be available on My Chart. We strongly encourage you to use this feature. If lab results are abnormal the clinic will contact you with the appropriate steps. If the clinic does not contact you assume the results are satisfactory. You can always view your results on My Chart. If you have questions regarding your health or results, please contact the clinic during office hours. You can also ask questions on My Chart.  We at [facility] are grateful that you chose us  to provide your care. We strive to provide evidence-based and compassionate care and are always looking for feedback. If you get a survey from the clinic please complete this so we can hear your opinions.  Heart Failure: Eating Plan Heart failure is a long-term condition where the heart can't pump enough blood through the body. When this happens, parts of the body don't get the blood and oxygen they need. Living with heart failure can be hard. But a healthy lifestyle and choosing the right foods may help to improve your symptoms. If you have heart failure, your eating plan will include limiting the amount of salt, also called sodium, and unhealthy fats you eat. What are tips for following this plan? Reading food labels Check food labels for the amount of sodium per serving. Choose foods that have less than 140 mg (milligrams) of sodium in each serving. Check food labels for the number of calories per serving. This is important if you need to limit your  daily calorie intake to lose weight. Check food labels for the serving size. If you eat more than one serving, you'll be eating more sodium and calories than what's listed on the label. Look for foods with the words sodium-free, very low sodium, or low sodium on the package. Foods labeled as reduced sodium, lightly salted, or no salt added may still have more sodium than what's recommended for you. Cooking Avoid adding salt when cooking. Before using any salt substitutes, talk with your health care provider or an expert in healthy eating called a dietitian. Season food with salt-free seasonings, spices, or herbs. Check the label of seasoning mixes to make sure they don't contain salt. Cook with heart-healthy oils, such as olive, canola, soybean, or sunflower oil. Do not fry foods. Cook foods using low-fat methods, like baking, boiling, grilling, and broiling. Limit unhealthy fats when cooking. To do this: Remove the skin from poultry, such as chicken. Remove all the fat you can see on meats. Skim the fat off from stews, soups, and gravies before serving them. Meal planning  Limit your intake of: Processed, canned, or prepackaged foods. Foods that are high in trans fat, such as fried foods. Sweets, desserts, sugary drinks, and other foods with added sugar. Full-fat dairy products, such as whole milk. Eat a balanced diet. This may include: 4-5 servings of fruit each day and 4-5 servings of vegetables each day. At each meal, try to fill one-half of your plate with fruits and vegetables. Up to 6-8 servings of whole grains each day.  Up to 2 servings of lean meat, poultry, or fish each day. One serving of meat is equal to 3 oz (85 g). This is about the same size as a deck of cards. 2 servings of low-fat dairy each day. Heart-healthy fats. Healthy fats called omega-3 fatty acids are a good choice. They're found in foods such as flaxseed and cold-water  fish like sardines, salmon, and  mackerel. Aim to eat 25-35 g (grams) of fiber a day. Foods that are high in fiber include apples, broccoli, carrots, beans, peas, and whole grains. Do not add salt or condiments that contain salt (such as soy sauce) to foods before eating. When eating at a restaurant, ask that your food be prepared with less salt or no salt, if possible. Try to eat 2 or more plant-based or meat-free meals each week. Cook more meals at home and eat less at restaurants, buffets, and fast food places. General information Do not eat more than 2,300 mg of sodium a day. The amount of sodium that's recommended for you may be lower, depending on your condition. Stay at a healthy weight as told. Ask your provider what a healthy weight is for you. Check your weight every day. Work with your provider and dietitian to make a plan that will help you lose weight or stay at your current weight. Limit how much fluid you drink. Ask your provider or dietitian how much fluid you can have each day. Limit or avoid alcohol as told. Recommended foods Fruits All fresh, frozen, and canned fruits. Dried fruits, such as raisins, prunes, and cranberries. Vegetables All fresh vegetables. Vegetables that are frozen without sauce or added salt. Low-sodium or sodium-free canned vegetables. Grains Bread with less than 80 mg of sodium per slice. Whole-wheat pasta, quinoa, and brown rice. Oats and oatmeal. Barley. Millet. Grits and cream of wheat. Whole-grain and whole-wheat cold cereal. Meats and other protein foods Lean cuts of meat. Skinless chicken and [place]. Fish with high omega-3 fatty acids, such as salmon, sardines, and other cold-water  fishes. Eggs. Dried beans, peas, and edamame. Unsalted nuts and nut butters. Dairy Low-fat or nonfat (skim) milk and dried milk. Rice milk, soy milk, and almond milk. Low-fat or nonfat yogurt. Small amounts of reduced-sodium block cheese. Low-sodium cottage cheese. Fats and oils Olive, canola,  soybean, flaxseed, avocado, or sunflower oil. Sweets and desserts Applesauce. Granola bars. Sugar-free pudding and gelatin. Frozen fruit bars. Seasoning and other foods Fresh and dried herbs. Lemon or lime juice. Vinegar. Low-sodium ketchup. Salt-free marinades, salad dressings, sauces, and seasonings. The items listed above may not be all the foods and drinks you can have. Talk with a dietitian to learn more. Foods to avoid Fruits Fruits that are dried with preservatives that contain sodium. Vegetables Canned vegetables. Frozen vegetables with sauce or seasonings. Creamed vegetables. [place] fries. Onion rings. Pickled vegetables and sauerkraut. Grains Bread with more than 80 mg of sodium per slice. Hot or cold cereal with more than 140 mg sodium per serving. Salted pretzels and crackers. Prepackaged breadcrumbs. Bagels, croissants, and biscuits. Meats and other protein foods Ribs and chicken wings. Bacon, ham, pepperoni, bologna, salami, and packaged luncheon meats. Hot dogs, bratwurst, and sausage. Canned meat. Smoked meat and fish. Salted nuts and seeds. Dairy Whole milk, half-and-half, and cream. Buttermilk. Processed cheese, cheese spreads, and cheese curds. Regular cottage cheese. Feta cheese. Shredded cheese. String cheese. Fats and oils Butter, lard, shortening, ghee, and bacon fat. Canned and packaged gravies. Seasoning and other foods Onion salt, garlic salt,  table salt, and sea salt. Marinades. Regular salad dressings. Relishes, pickles, and olives. Meat flavorings and tenderizers, and bouillon cubes. Horseradish, ketchup, and mustard. Worcestershire sauce. Teriyaki sauce, soy sauce (including reduced sodium). Hot sauce and Tabasco sauce. Steak sauce, fish sauce, oyster sauce, and cocktail sauce. Taco seasonings. Barbecue sauce. Tartar sauce. The items listed above may not be all the foods and drinks you should avoid. Talk with a dietitian to learn more. This information is not  intended to replace advice given to you by your health care provider. Make sure you discuss any questions you have with your health care provider. Document Revised: 05/25/2023 Document Reviewed: 05/14/2023 Elsevier Patient Education  2024 [employer].

## 2024-05-16 ENCOUNTER — Ambulatory Visit: Payer: Medicare Other | Admitting: Nurse Practitioner

## 2024-05-16 ENCOUNTER — Encounter: Payer: Self-pay | Admitting: Nurse Practitioner

## 2024-05-16 DIAGNOSIS — E1169 Type 2 diabetes mellitus with other specified complication: Secondary | ICD-10-CM

## 2024-05-16 DIAGNOSIS — I5032 Chronic diastolic (congestive) heart failure: Secondary | ICD-10-CM | POA: Diagnosis not present

## 2024-05-16 DIAGNOSIS — D692 Other nonthrombocytopenic purpura: Secondary | ICD-10-CM | POA: Diagnosis not present

## 2024-05-16 DIAGNOSIS — E1142 Type 2 diabetes mellitus with diabetic polyneuropathy: Secondary | ICD-10-CM

## 2024-05-16 DIAGNOSIS — E538 Deficiency of other specified B group vitamins: Secondary | ICD-10-CM | POA: Diagnosis not present

## 2024-05-16 DIAGNOSIS — E1351 Other specified diabetes mellitus with diabetic peripheral angiopathy without gangrene: Secondary | ICD-10-CM

## 2024-05-16 DIAGNOSIS — E559 Vitamin D deficiency, unspecified: Secondary | ICD-10-CM

## 2024-05-16 DIAGNOSIS — Z8673 Personal history of transient ischemic attack (TIA), and cerebral infarction without residual deficits: Secondary | ICD-10-CM

## 2024-05-16 DIAGNOSIS — J432 Centrilobular emphysema: Secondary | ICD-10-CM | POA: Diagnosis not present

## 2024-05-16 DIAGNOSIS — I7123 Aneurysm of the descending thoracic aorta, without rupture: Secondary | ICD-10-CM

## 2024-05-16 DIAGNOSIS — E785 Hyperlipidemia, unspecified: Secondary | ICD-10-CM | POA: Diagnosis not present

## 2024-05-16 DIAGNOSIS — E66811 Obesity, class 1: Secondary | ICD-10-CM

## 2024-05-16 LAB — BAYER DCA HB A1C WAIVED: HB A1C (BAYER DCA - WAIVED): 6.6 % — ABNORMAL HIGH (ref 4.8–5.6)

## 2024-05-16 MED ORDER — CETIRIZINE HCL 10 MG PO TABS: 10.0000 mg | ORAL_TABLET | Freq: Every day | ORAL | 3 refills | Status: DC

## 2024-05-16 MED ORDER — EZETIMIBE 10 MG PO TABS: 10.0000 mg | ORAL_TABLET | Freq: Every day | ORAL | 4 refills | Status: AC

## 2024-05-16 NOTE — Assessment & Plan Note (Signed)
 Followed by vascular, last CT scan showed no significant increase in size. CT scan lung yearly where this can be assessed.  Continue to collaborate with vascular.

## 2024-05-16 NOTE — Assessment & Plan Note (Signed)
Chronic, ongoing. Continue Zetia at this time as is tolerating + Rosuvastatin at low dose. Have tried multiple statins with ongoing myalgias with use.  Praluent and Zetia also caused discomfort.  Check lipid panel and CMP today with goal LDL<70.  CCM collaboration continues.

## 2024-05-16 NOTE — Assessment & Plan Note (Signed)
Ongoing.  Recheck Vitamin D level today.  Recommend continue daily Vitamin D3 supplement, 2000 units. 

## 2024-05-16 NOTE — Assessment & Plan Note (Signed)
 Chronic, stable.  Continue monthly injections.  Check level.

## 2024-05-16 NOTE — Assessment & Plan Note (Signed)
History of in 2019, most recent even 123XX123 = atheroembolic causing CVA (cerebral vascular accident) of right MCA distribution.  At this time continue Zetia and Crestor at low dose, has not tolerated statins in past, even on decreased pattern.  Check labs today.  Discussed neuro goals: A1c <6.5% (current 6.4%), BP <130/80, and LDL <70. Continue to collaborate with neurology.

## 2024-05-16 NOTE — Assessment & Plan Note (Signed)
Chronic, stable.  EF 55-60% on 07/03/22. Euvolemic.  Continue collaboration with cardiology.  Recommend: - Reminded to call for an overnight weight gain of >2 pounds or a weekly weight gain of >5 pounds - not adding salt to food and read food labels. Reviewed the importance of keeping daily sodium intake to 2000mg  daily. - Avoid Ibuprofen products

## 2024-05-16 NOTE — Assessment & Plan Note (Signed)
On daily Plavix and ASA.  Moderate bruising bilateral upper extremity, recommend she discuss with neurology and cardiology whether she needs to continue to take both ASA and Plavix OR if she can reduce to one of these only.  Recommend cleansing skin with gentle cleanser and use of daily lotion.  Monitor for skin breakdown and address if present.

## 2024-05-16 NOTE — Progress Notes (Addendum)
 BP 122/71   Pulse 92   Temp 97.9 F (36.6 C) (Oral)   Ht 5' 9 (1.753 m)   Wt 202 lb 6.4 oz (91.8 kg)   LMP  (LMP Unknown)   SpO2 96%   BMI 29.89 kg/m    Subjective:    Patient ID: [name], female    DOB: [birth year], 78 y.o.   MRN: [id number]  HPI: BIONCA MCKEY is a 78 y.o. female  Chief Complaint  Patient presents with   Diabetes   Hyperlipidemia   Depression   DIABETES A1c 6.2% in January. No current medications. Metformin  caused discomfort. Continues diet focus. Takes monthly injections for B12 + Vitamin D  for low levels. Hypoglycemic episodes:no Polydipsia/polyuria: no Visual disturbance: no Chest pain: no Paresthesias: no Glucose Monitoring: yes             Accucheck frequency: not checking             Fasting glucose:              Post prandial:             Evening:              Before meals: Taking Insulin ?: no             Long acting insulin :             Short acting insulin : Blood Pressure Monitoring: not checking Retinal Examination: Up to Date Foot Exam: Up to Date Pneumovax: Up to Date Influenza: Up To Date Aspirin : yes  HYPERLIPIDEMIA/HF Continues Crestor  and Zetia , Plavix . Last cardiology visit 04/22/24, has history of CVA in September 2023. EF 55-60% in September 2023. Hyperlipidemia status: good compliance Satisfied with current treatment?  yes Side effects:  no Medication compliance: good compliance Past cholesterol meds: multiple Supplements: none Aspirin :  yes The ASCVD Risk score (Arnett DK, et al., 2019) failed to calculate for the following reasons:   Risk score cannot be calculated because patient has a medical history suggesting prior/existing ASCVD Chest pain:  no Coronary artery disease:  no Family history CAD:  yes Family history early CAD:  no    COPD Uses Breztri  daily and Albuterol  PRN.  In past took Stiolto and Symbicort .   Lung screening last 08/03/23 continues to note mild centrilobular emphysema and aortic  atherosclerosis.  Goes to pulmonary for initial visit.  COPD status: stable Satisfied with current treatment?: yes Oxygen use: no Dyspnea frequency: occasional Cough frequency: improved Rescue inhaler frequency: occasional Limitation of activity: no Productive cough: none Last Spirometry: unknown Pneumovax: Up to Date Influenza: Up to Date    CHRONIC KIDNEY DISEASE (3a)  CKD status: stable Medications renally dose: yes Previous renal evaluation: no Pneumovax:  Up to Date Influenza Vaccine:  Up to Date   LYMPHEDEMA WITH CHRONIC VENOUS INSUFFICIENCY: Continues Tylenol  and lymphedema pumps at home. Has been treated for cellulitis in past multiple times, last 06/17/23. Saw vascular on 03/31/24.  Has abdominal aortic aneurysm, last measured 4 cm on imaging 07/25/23. Duration: months Pain: yes Quality: dull, aching, and throbbing Frequency: intermittent Context:  fluctuating Decreased function/range of motion: at times Erythema: none Swelling: yes, stable Heat or warmth: none Morning stiffness: yes unsure how long this lasts     02/12/2024    1:21 PM 11/13/2023    3:03 PM 07/22/2023    1:32 PM 04/21/2023    4:37 PM 10/07/2022    1:11 PM  Depression screen PHQ 2/9  Decreased  Interest 0 0 0 1 0  Down, Depressed, Hopeless 0 1 0 1 0  PHQ - 2 Score 0 1 0 2 0  Altered sleeping 0 0 0 0 1  Tired, decreased energy 1 1 1  0 1  Change in appetite 0 0 0 0 0  Feeling bad or failure about yourself  0 1 0 0 0  Trouble concentrating 0 0 0 0 0  Moving slowly or fidgety/restless 0 0 0 0 1  Suicidal thoughts  0 0 0 0  PHQ-9 Score 1 3 1 2 3   Difficult doing work/chores   Not difficult at all  Not difficult at all       02/12/2024    1:21 PM 11/13/2023    3:05 PM 07/22/2023    1:32 PM 04/21/2023    4:37 PM  GAD 7 : Generalized Anxiety Score  Nervous, Anxious, on Edge 0 1 1 1   Control/stop worrying 0 0 0 1  Worry too much - different things 0 1 0 1  Trouble relaxing 0 0 0 0  Restless 0 0 0 0   Easily annoyed or irritable 0 1 0 0  Afraid - awful might happen 0 2 0 1  Total GAD 7 Score 0 5 1 4   Anxiety Difficulty  Somewhat difficult Not difficult at all Not difficult at all   Relevant past medical, surgical, family and social history reviewed and updated as indicated. Interim medical history since our last visit reviewed. Allergies and medications reviewed and updated.  Review of Systems  Constitutional:  Negative for activity change, appetite change, diaphoresis, fatigue and fever.  Respiratory:  Negative for cough, chest tightness, shortness of breath and wheezing.   Cardiovascular:  Negative for chest pain, palpitations and leg swelling.  Gastrointestinal: Negative.   Endocrine: Negative.   Neurological: Negative.   Psychiatric/Behavioral: Negative.        Objective:    BP 122/71   Pulse 92   Temp 97.9 F (36.6 C) (Oral)   Ht 5' 9 (1.753 m)   Wt 202 lb 6.4 oz (91.8 kg)   LMP  (LMP Unknown)   SpO2 96%   BMI 29.89 kg/m   Wt Readings from Last 3 Encounters:  05/16/24 202 lb 6.4 oz (91.8 kg)  04/22/24 203 lb (92.1 kg)  03/31/24 200 lb 6 oz (90.9 kg)    Physical Exam Vitals and nursing note reviewed.  Constitutional:      General: She is awake. She is not in acute distress.    Appearance: She is well-developed and well-groomed. She is not ill-appearing or toxic-appearing.  HENT:     Head: Normocephalic.     Right Ear: Hearing and external ear normal.     Left Ear: Hearing and external ear normal.  Eyes:     General: Lids are normal.        Right eye: No discharge.        Left eye: No discharge.     Conjunctiva/sclera: Conjunctivae normal.     Pupils: Pupils are equal, round, and reactive to light.  Neck:     Thyroid : No thyromegaly.     Vascular: No carotid bruit.  Cardiovascular:     Rate and Rhythm: Normal rate and regular rhythm.     Heart sounds: Normal heart sounds. No murmur heard.    No gallop.     Comments: Compression hose on. Pulmonary:      Effort: Pulmonary effort is normal. No accessory muscle usage  or respiratory distress.     Breath sounds: Normal breath sounds.  Abdominal:     General: Bowel sounds are normal. There is no distension.     Palpations: Abdomen is soft.     Tenderness: There is no abdominal tenderness.  Musculoskeletal:     Cervical back: Normal range of motion and neck supple.     Right lower leg: 1+ Edema present.     Left lower leg: 1+ Edema present.  Lymphadenopathy:     Cervical: No cervical adenopathy.  Skin:    General: Skin is warm and dry.     Findings: Bruising present.     Comments: Bruising to bilateral upper extremities.   Neurological:     Mental Status: She is alert and oriented to person, place, and time.     Deep Tendon Reflexes: Reflexes are normal and symmetric.     Reflex Scores:      Brachioradialis reflexes are 2+ on the right side and 2+ on the left side.      Patellar reflexes are 2+ on the right side and 2+ on the left side. Psychiatric:        Attention and Perception: Attention normal.        Mood and Affect: Mood normal.        Speech: Speech normal.        Behavior: Behavior normal. Behavior is cooperative.        Thought Content: Thought content normal.    Diabetic Foot Exam - Simple   Simple Foot Form Visual Inspection No deformities, no ulcerations, no other skin breakdown bilaterally: Yes Sensation Testing Intact to touch and monofilament testing bilaterally: Yes Pulse Check Posterior Tibialis and Dorsalis pulse intact bilaterally: Yes Comments Edema to both feet and legs per baseline. Compression.     Results for orders placed or performed in visit on 03/29/24  Alpha-1-Antitrypsin Deficiency   Collection Time: 03/29/24 11:50 AM  Result Value Ref Range   AAT, DNA Analysis Comment    Additional Information: Comment    Electronically Signed by: Comment   CBC with Differential/Platelet   Collection Time: 03/29/24 11:50 AM  Result Value Ref Range   WBC  9.3 3.4 - 10.8 x10E3/uL   RBC 4.56 3.77 - 5.28 x10E6/uL   Hemoglobin 14.1 11.1 - 15.9 g/dL   Hematocrit 55.8 65.9 - 46.6 %   MCV 97 79 - 97 fL   MCH 30.9 26.6 - 33.0 pg   MCHC 32.0 31.5 - 35.7 g/dL   RDW 87.3 88.2 - 84.5 %   Platelets 219 150 - 450 x10E3/uL   Neutrophils 72 Not Estab. %   Lymphs 18 Not Estab. %   Monocytes 8 Not Estab. %   Eos 1 Not Estab. %   Basos 0 Not Estab. %   Neutrophils Absolute 6.8 1.4 - 7.0 x10E3/uL   Lymphocytes Absolute 1.7 0.7 - 3.1 x10E3/uL   Monocytes Absolute 0.7 0.1 - 0.9 x10E3/uL   EOS (ABSOLUTE) 0.1 0.0 - 0.4 x10E3/uL   Basophils Absolute 0.0 0.0 - 0.2 x10E3/uL   Immature Granulocytes 1 Not Estab. %   Immature Grans (Abs) 0.1 0.0 - 0.1 x10E3/uL   *Note: Due to a large number of results and/or encounters for the requested time period, some results have not been displayed. A complete set of results can be found in Results Review.      Assessment & Plan:   Problem List Items Addressed This Visit  Cardiovascular and Mediastinum   Senile purpura (HCC)   On daily Plavix  and ASA.  Moderate bruising bilateral upper extremity, recommend she discuss with neurology and cardiology whether she needs to continue to take both ASA and Plavix  OR if she can reduce to one of these only.  Recommend cleansing skin with gentle cleanser and use of daily lotion.  Monitor for skin breakdown and address if present.      Relevant Medications   ezetimibe  (ZETIA ) 10 MG tablet   Peripheral vascular disease due to secondary diabetes (HCC)   Chronic, ongoing with lymphedema and PVD.  Continue to collaborate with vascular and current medication regimen.  A1c remains stable at 6.6% today.  Continue diet focus for diabetes control.      Relevant Medications   ezetimibe  (ZETIA ) 10 MG tablet   Chronic diastolic CHF (congestive heart failure) (HCC)   Chronic, stable.  EF 55-60% on 07/03/22. Euvolemic. Continue collaboration with cardiology.  Recommend: - Reminded to  call for an overnight weight gain of >2 pounds or a weekly weight gain of >5 pounds - not adding salt to food and read food labels. Reviewed the importance of keeping daily sodium intake to 2000mg  daily. - Avoid Ibuprofen products      Relevant Medications   ezetimibe  (ZETIA ) 10 MG tablet   Aneurysm of descending thoracic aorta (HCC)   Followed by vascular, last CT scan showed no significant increase in size. CT scan lung yearly where this can be assessed.  Continue to collaborate with vascular.      Relevant Medications   ezetimibe  (ZETIA ) 10 MG tablet     Respiratory   Centrilobular emphysema (HCC)   Chronic, ongoing. Continue Breztri , for triple therapy, is tolerating and Albuterol  as needed. Sees pulmonary tomorrow for initial visit. Continue annual lung screening until age 78. Will continue to collaborate with [facility] pharmacist for assistance on medications. We discussed adding Singulair  due to underlying allergies, educated on this and BLACK BOX warning.  She prefers not to start.      Relevant Medications   cetirizine  (ZYRTEC ) 10 MG tablet     Endocrine   Type 2 diabetes mellitus with morbid obesity (HCC) - Primary   Chronic, stable, remains off medication at this time -- did not tolerate Metformin  in past.  Could consider trial of Farxiga in future.  A1c 6.6% today and urine ALB 12 November 2023.  At this time continue diet focus, but with history of CVA goal is to maintain A1c <6.5%.  Monitor sugars at home a few days a week and document for provider. - No ACE or ARB on board due to lower BP readings at baseline.  Statin on board. - Vaccinations up to date. - Foot and eye exam up to date.      Relevant Orders   Bayer DCA Hb A1c Waived   TSH   Peripheral sensory neuropathy due to type 2 diabetes mellitus (HCC)   Chronic, ongoing.  A1c 6.6% today and urine ALB 12 November 2023. Currently no medications for this.  Refer to morbid obesity plan of care for further.      Relevant  Orders   Bayer DCA Hb A1c Waived   Hyperlipidemia associated with type 2 diabetes mellitus (HCC)   Chronic, ongoing. Continue Zetia  at this time as is tolerating + Rosuvastatin  at low dose. Have tried multiple statins with ongoing myalgias with use.  Praluent  and Zetia  also caused discomfort.  Check lipid panel and CMP today with goal  LDL<70.  CCM collaboration continues.        Relevant Medications   ezetimibe  (ZETIA ) 10 MG tablet   Other Relevant Orders   Bayer DCA Hb A1c Waived   Comprehensive metabolic panel with GFR   Lipid Panel w/o Chol/HDL Ratio     Other   Vitamin D  deficiency   Ongoing.  Recheck Vitamin D  level today.  Recommend continue daily Vitamin D3 supplement, 2000 units.      Relevant Orders   VITAMIN D  25 Hydroxy (Vit-D Deficiency, Fractures)   Vitamin B12 deficiency   Chronic, stable.  Continue monthly injections.  Check level.      Relevant Orders   Vitamin B12   Obesity   BMI 29.89 with T2DM, HLD, COPD.  Recommended eating smaller high protein, low fat meals more frequently and exercising 30 mins a day 5 times a week with a goal of 10-15lb weight loss in the next 3 months. Patient voiced their understanding and motivation to adhere to these recommendations.       History of CVA (cerebrovascular accident)   History of in 2019, most recent even 07/02/22 = atheroembolic causing CVA (cerebral vascular accident) of right MCA distribution.  At this time continue Zetia  and Crestor  at low dose, has not tolerated statins in past, even on decreased pattern.  Check labs today.  Discussed neuro goals: A1c <6.5% (current 6.4%), BP <130/80, and LDL <70. Continue to collaborate with neurology.          Follow up plan: Return in about 3 months (around 08/16/2024) for Annual Physical after 07/21/24.

## 2024-05-16 NOTE — Assessment & Plan Note (Signed)
Chronic, stable, only uses PRN.  Continue daily Citalopram and monitor.  Adjust dose or medication as needed, do not increase Celexa to 40 MG due to patient age >57.  Denies SI/HI. ?

## 2024-05-16 NOTE — Assessment & Plan Note (Signed)
 Chronic, ongoing with lymphedema and PVD.  Continue to collaborate with vascular and current medication regimen.  A1c remains stable at 6.6% today.  Continue diet focus for diabetes control.

## 2024-05-16 NOTE — Assessment & Plan Note (Signed)
 Chronic, ongoing. Continue Breztri , for triple therapy, is tolerating and Albuterol  as needed. Sees pulmonary tomorrow for initial visit. Continue annual lung screening until age 78. Will continue to collaborate with [facility] pharmacist for assistance on medications. We discussed adding Singulair  due to underlying allergies, educated on this and BLACK BOX warning.  She prefers not to start.

## 2024-05-16 NOTE — Assessment & Plan Note (Signed)
 Chronic, ongoing.  A1c 6.6% today and urine ALB 12 November 2023. Currently no medications for this.  Refer to morbid obesity plan of care for further.

## 2024-05-16 NOTE — Assessment & Plan Note (Signed)
 BMI 29.89 with T2DM, HLD, COPD.  Recommended eating smaller high protein, low fat meals more frequently and exercising 30 mins a day 5 times a week with a goal of 10-15lb weight loss in the next 3 months. Patient voiced their understanding and motivation to adhere to these recommendations.

## 2024-05-16 NOTE — Assessment & Plan Note (Addendum)
 Chronic, stable, remains off medication at this time -- did not tolerate Metformin  in past.  Could consider trial of Farxiga in future.  A1c 6.6% today and urine ALB 12 November 2023.  At this time continue diet focus, but with history of CVA goal is to maintain A1c <6.5%.  Monitor sugars at home a few days a week and document for provider. - No ACE or ARB on board due to lower BP readings at baseline.  Statin on board. - Vaccinations up to date. - Foot and eye exam up to date.

## 2024-05-17 ENCOUNTER — Ambulatory Visit (INDEPENDENT_AMBULATORY_CARE_PROVIDER_SITE_OTHER): Admitting: Pulmonary Disease

## 2024-05-17 ENCOUNTER — Encounter: Payer: Self-pay | Admitting: Pulmonary Disease

## 2024-05-17 ENCOUNTER — Ambulatory Visit: Payer: Self-pay | Admitting: Nurse Practitioner

## 2024-05-17 VITALS — BP 114/66 | HR 78 | Temp 98.2°F | Ht 69.0 in | Wt 201.0 lb

## 2024-05-17 DIAGNOSIS — J440 Chronic obstructive pulmonary disease with acute lower respiratory infection: Secondary | ICD-10-CM | POA: Diagnosis not present

## 2024-05-17 DIAGNOSIS — Z1231 Encounter for screening mammogram for malignant neoplasm of breast: Secondary | ICD-10-CM | POA: Diagnosis not present

## 2024-05-17 DIAGNOSIS — R058 Other specified cough: Secondary | ICD-10-CM

## 2024-05-17 DIAGNOSIS — J209 Acute bronchitis, unspecified: Secondary | ICD-10-CM | POA: Diagnosis not present

## 2024-05-17 DIAGNOSIS — J449 Chronic obstructive pulmonary disease, unspecified: Secondary | ICD-10-CM

## 2024-05-17 LAB — COMPREHENSIVE METABOLIC PANEL WITH GFR
ALT: 15 IU/L (ref 0–32)
AST: 20 IU/L (ref 0–40)
Albumin: 4.4 g/dL (ref 3.8–4.8)
Alkaline Phosphatase: 84 IU/L (ref 44–121)
BUN/Creatinine Ratio: 16 (ref 12–28)
BUN: 14 mg/dL (ref 8–27)
Bilirubin Total: 0.5 mg/dL (ref 0.0–1.2)
CO2: 20 mmol/L (ref 20–29)
Calcium: 9.3 mg/dL (ref 8.7–10.3)
Chloride: 102 mmol/L (ref 96–106)
Creatinine, Ser: 0.89 mg/dL (ref 0.57–1.00)
Globulin, Total: 2 g/dL (ref 1.5–4.5)
Glucose: 101 mg/dL — ABNORMAL HIGH (ref 70–99)
Potassium: 4.1 mmol/L (ref 3.5–5.2)
Sodium: 138 mmol/L (ref 134–144)
Total Protein: 6.4 g/dL (ref 6.0–8.5)
eGFR: 66 mL/min/1.73 (ref >59)

## 2024-05-17 LAB — TSH: TSH: 1.08 u[IU]/mL (ref 0.450–4.500)

## 2024-05-17 LAB — LIPID PANEL W/O CHOL/HDL RATIO
Cholesterol, Total: 132 mg/dL (ref 100–199)
HDL: 67 mg/dL (ref >39)
LDL Chol Calc (NIH): 51 mg/dL (ref 0–99)
Triglycerides: 70 mg/dL (ref 0–149)
VLDL Cholesterol Cal: 14 mg/dL (ref 5–40)

## 2024-05-17 LAB — HM MAMMOGRAPHY

## 2024-05-17 LAB — VITAMIN D 25 HYDROXY (VIT D DEFICIENCY, FRACTURES): Vit D, 25-Hydroxy: 52.6 ng/mL (ref 30.0–100.0)

## 2024-05-17 LAB — VITAMIN B12: Vitamin B-12: 672 pg/mL (ref 232–1245)

## 2024-05-17 MED ORDER — AZITHROMYCIN 250 MG PO TABS: ORAL_TABLET | ORAL | 0 refills | Status: AC

## 2024-05-17 NOTE — Progress Notes (Signed)
 Contacted via MyChart  Good news, your labs have returned and overall look great. I have no concerns on these.  Any questions? Continue all current medications.   Keep being amazing!!  Thank you for allowing me to participate in your care.  I appreciate you. Kindest regards, Gilverto Dileonardo

## 2024-05-17 NOTE — Progress Notes (Signed)
 Subjective:    Patient ID: Sharon [name], female    DOB: January 18, 1946, 78 y.o.   MRN: [id number]  Patient Care Team: Valerio Melanie DASEN, NP as PCP - General (Nurse Practitioner) Perla Evalene PARAS, MD as PCP - Cardiology (Cardiology) Fernande Elspeth BROCKS, MD as PCP - Electrophysiology (Cardiology) Dessa Reyes ORN, MD as Consulting Physician (General Surgery) Daphane Rosella, NP (Inactive) as Nurse Practitioner (Nurse Practitioner) Perla Evalene PARAS, MD as Consulting Physician (Cardiology) Merlynn Lyle CROME, LCSW as Social Worker (Licensed Clinical Social Worker) Deanna Rosella LABOR, [facility] (Pharmacist)  Chief Complaint  Patient presents with   Consult    BACKGROUND: Patient is a 78 year old former smoker (28 PY), with a history as noted below, who presents for evaluation and management of COPD and recent issues with cough after an upper respiratory infection.  She is kindly referred by Jolene [name], NP.  HPI Discussed the use of AI scribe software for clinical note transcription with the patient, who gave verbal consent to proceed.  History of Present Illness   Sharon [name] is a 78 year old female with COPD and emphysema who presents with a cough and shortness of breath. She was referred by Jolene [name], NP for evaluation of cough which is now pretty much resolved.  Her cough began after a respiratory infection in April, which she attributes to exposure from a care worker who was ill. Despite treatment, the cough has persisted, and she is now experiencing allergy symptoms. She notes 'yucky looking yellow' sputum in the mornings, which she associates with bronchitis. She reports that she does not really have much trouble breathing and does not get tired when walking.    She has a history of smoking, which she quit in 2017. She describes herself as a light smoker, often lighting cigarettes and then setting them aside. She has a history of asthma in childhood, which she outgrew. She undergoes  annual lung cancer screenings and had a chest x-ray in April, which was consistent with COPD changes.  She is on Breztri  and as needed albuterol  for COPD management.  She mentions having lymphedema and dermatitis, which cause swelling, and she has had strokes in the past, affecting her memory. She has undergone pulmonary function tests in the past but has not had any recent ones since a change in staff at her previous provider's office.   She is retired, no prior significant occupational exposure.     Review of Systems A 10 point review of systems was performed and it is as noted above otherwise negative.   Past Medical History:  Diagnosis Date   Aneurysm of descending thoracic aorta (HCC)    Anxiety    Arthritis    hands, upper back   Asthma    Cervical cancer (HCC)    in situ 1970's   CHF (congestive heart failure) (HCC)    COPD (chronic obstructive pulmonary disease) (HCC)    Coronary artery disease    COVID    GERD (gastroesophageal reflux disease)    History of cervical cancer    HLD (hyperlipidemia)    Lymphedema    Menopausal disorder    Neuropathy    Osteoporosis    Pneumonia 1960   Pre-diabetes    PVD (peripheral vascular disease) (HCC)    Spasm of abdominal muscles of right side    intermittent   Stroke (HCC) 2019   TMJ (dislocation of temporomandibular joint)    Wears dentures    partial lower  Past Surgical History:  Procedure Laterality Date   bladder botox  2005   BLADDER SUSPENSION  2004   CATARACT EXTRACTION W/PHACO Right 03/09/2018   Procedure: CATARACT EXTRACTION PHACO AND INTRAOCULAR LENS PLACEMENT (IOC) right;  Surgeon: Myrna Adine Anes, MD;  Location: [facility];  Service: Ophthalmology;  Laterality: Right;  CALL CELL 1ST   CATARACT EXTRACTION W/PHACO Left 04/19/2018   Procedure: CATARACT EXTRACTION PHACO AND INTRAOCULAR LENS PLACEMENT (IOC)  LEFT;  Surgeon: Myrna Adine Anes, MD;  Location: [facility];  Service:  Ophthalmology;  Laterality: Left;   CHOLECYSTECTOMY, LAPAROSCOPIC  02/18/2023   COLONOSCOPY WITH PROPOFOL  N/A 12/13/2015   Procedure: COLONOSCOPY WITH PROPOFOL ;  Surgeon: Rogelia Copping, MD;  Location: [facility];  Service: Endoscopy;  Laterality: N/A;   COLONOSCOPY WITH PROPOFOL  N/A 03/14/2021   Procedure: COLONOSCOPY WITH PROPOFOL ;  Surgeon: Copping Rogelia, MD;  Location: [facility];  Service: Endoscopy;  Laterality: N/A;  diabetic   LOOP RECORDER INSERTION N/A 01/07/2018   Procedure: LOOP RECORDER INSERTION;  Surgeon: Fernande Elspeth BROCKS, MD;  Location: [facility];  Service: Cardiovascular;  Laterality: N/A;   LOOP RECORDER REMOVAL     POLYPECTOMY N/A 12/13/2015   Procedure: POLYPECTOMY;  Surgeon: Rogelia Copping, MD;  Location: [facility];  Service: Endoscopy;  Laterality: N/A;  SIGMOID COLON POLYPS X  5   POLYPECTOMY N/A 03/14/2021   Procedure: POLYPECTOMY;  Surgeon: Copping Rogelia, MD;  Location: [facility];  Service: Endoscopy;  Laterality: N/A;   SHOULDER ARTHROSCOPY W/ ROTATOR CUFF REPAIR Right 1998   TEE WITHOUT CARDIOVERSION N/A 01/06/2018   Procedure: TRANSESOPHAGEAL ECHOCARDIOGRAM (TEE);  Surgeon: Perla Evalene PARAS, MD;  Location: [facility];  Service: Cardiovascular;  Laterality: N/A;   TONSILLECTOMY AND ADENOIDECTOMY     TOOTH EXTRACTION     July 2023   VAGINAL HYSTERECTOMY  1970's    Patient Active Problem List   Diagnosis Date Noted   Exposure to the flu 11/20/2023   Peripheral sensory neuropathy due to type 2 diabetes mellitus (HCC) 04/19/2023   Chronic diastolic CHF (congestive heart failure) (HCC) 07/04/2022   History of colonic polyps    Rectal polyp    Peripheral vascular disease due to secondary diabetes (HCC) 01/11/2021   Osteopenia of neck of left femur 01/11/2021   Drug-induced myopathy 09/13/2020   Hyperlipidemia associated with type 2 diabetes mellitus (HCC) 02/14/2020   Obesity 02/14/2020   Vitamin D  deficiency 06/28/2019    Type 2 diabetes mellitus with morbid obesity (HCC) 06/28/2019   GERD (gastroesophageal reflux disease) 12/07/2018   Vitamin B12 deficiency 12/05/2018   Senile purpura (HCC) 07/28/2018   Lymphedema 07/14/2018   Aneurysm of descending thoracic aorta (HCC) 05/04/2018   Allergic rhinitis 02/16/2018   Aortic atherosclerosis (HCC) 01/13/2018   History of CVA (cerebrovascular accident)    CAD (coronary artery disease) 11/25/2017   Caregiver stress 11/25/2017   Stress incontinence 06/19/2017   Personal history of tobacco use, presenting hazards to health 04/01/2017   Anxiety 03/13/2017   Advanced care planning/counseling discussion 10/17/2016   Centrilobular emphysema (HCC) 10/17/2015    Family History  Problem Relation Age of Onset   Diabetes Mother    Heart disease Mother    Stroke Mother    Stroke Maternal Grandmother    Hyperlipidemia Neg Hx     Social History   Tobacco Use   Smoking status: Former    Current packs/day: 0.00    Average packs/day: 0.5 packs/day for 56.0 years (28.0 ttl  pk-yrs)    Types: Cigarettes    Start date: 07/14/1960    Quit date: 07/14/2016    Years since quitting: 7.8   Smokeless tobacco: Never  Substance Use Topics   Alcohol use: Yes    Comment: occassional Margarita    Allergies  Allergen Reactions   Statins Other (See Comments)    Myalgias and joint pain on atorvastatin  40mg  and rosuvastatin  20mg    Dermacerin [Aquaphilic]    Elidel  [Pimecrolimus ]     Patient reports allergy   Nsaids    Pineapple Itching    throat   Praluent  [Alirocumab ]     Flu like symptoms, leg swelling, rash   Quinolones     FLUOROQUINOLONES - Pt reports she was told to NEVER take these.   Baclofen  Swelling   Bactrim  [Sulfamethoxazole -Trimethoprim ] Nausea And Vomiting   Ibuprofen Itching and Rash    Mouth swelling and itching   Lanolin Rash    Allergy to triamcinolone     Latex Rash    Some bandaids, some gloves; BLOOD TEST NEGATIVE   Librium [Chlordiazepoxide]  Itching    Dizziness    Naprosyn [Naproxen] Rash    Mouth swelling and swelling   Other Rash    Estonia nuts - mouth swelling    Current Meds  Medication Sig   acetaminophen  (TYLENOL ) 500 MG tablet Take 500 mg by mouth every 6 (six) hours as needed.   albuterol  (VENTOLIN  HFA) 108 (90 Base) MCG/ACT inhaler INHALE 2 PUFFS BY MOUTH EVERY 6 HOURS AS NEEDED FOR WHEEZING OR SHORTNESS OF BREATH (Patient taking differently: Inhale 2 puffs into the lungs every 6 (six) hours as needed for wheezing or shortness of breath.)   aspirin  EC 81 MG tablet Take 81 mg by mouth 3 (three) times a week. Swallow whole. M-W-Fr   azithromycin  (ZITHROMAX ) 250 MG tablet Take 2 tablets (500 mg) on  Day 1,  followed by 1 tablet (250 mg) once daily on Days 2 through 5.   Blood Glucose Monitoring Suppl (ONETOUCH VERIO) w/Device KIT Utilize to check blood sugar twice a day, fasting in morning with goal < 130 and then 2 hours after a meal with goal <180.  Document and bring to visits.   BREZTRI  AEROSPHERE 160-9-4.8 MCG/ACT AERO INHALE 2 PUFFS BY MOUTH TWICE DAILY AS DIRECTED (Patient taking differently: Inhale 2 puffs into the lungs 2 (two) times daily.)   cetirizine  (ZYRTEC ) 10 MG tablet Take 1 tablet (10 mg total) by mouth daily.   Cholecalciferol  (VITAMIN D3) 50 MCG (2000 UT) CAPS Take 1 capsule by mouth daily.   citalopram  (CELEXA ) 20 MG tablet Take 1 tablet (20 mg total) by mouth as needed.   clopidogrel  (PLAVIX ) 75 MG tablet Take 1 tablet (75 mg total) by mouth daily.   Crisaborole  (EUCRISA ) 2 % OINT Apply 1 application  topically daily. Use for itching and inflammation at left leg   Cyanocobalamin  1000 MCG/ML KIT Inject 1mL into the muscle once a month (Patient taking differently: Inject 1,000 mcg into the muscle every 30 (thirty) days. Inject 1mL into the muscle once a month)   desoximetasone  (TOPICORT ) 0.05 % cream Apply qd/bid daily to affected areas of lower legs for  2 weeks. Can use for a max of 4 weeks if rash has  not improved at 2 weeks Avoid applying to face, groin, and axilla. Use as directed.   ezetimibe  (ZETIA ) 10 MG tablet Take 1 tablet (10 mg total) by mouth daily.   gabapentin (NEURONTIN) 100 MG tablet Take  100 mg by mouth 2 (two) times daily.   glucose blood (ONETOUCH VERIO) test strip UTILIIZE TO CHECK BLOOD SUGAR TWICE DAILY FASTING IN MORNING WITH GOAL<130 AND THAN 2 HOURS AFTER A MEAL WITH GGOAL<180   Glycerin-Polysorbate 80 (REFRESH DRY EYE THERAPY OP) Apply to eye.   Lancets (ONETOUCH ULTRASOFT) lancets Utilize to check blood sugar twice a day, fasting in morning with goal < 130 and then 2 hours after a meal with goal <180.  Document and bring to visits.   loratadine  (CLARITIN ) 10 MG tablet Take 1 tablet (10 mg total) by mouth daily.   meclizine  (ANTIVERT ) 12.5 MG tablet Take 12.5 mg by mouth 3 (three) times daily. (Patient taking differently: Take 12.5 mg by mouth 3 (three) times daily. PATIENT TAKING AS NEEDED)   mometasone  (ELOCON ) 0.1 % cream Apply 1 Application topically 2 (two) times daily. As needed for flares up to two weeks only   pantoprazole  (PROTONIX ) 40 MG tablet TAKE 1 TABLET(40 MG) BY MOUTH DAILY   rosuvastatin  (CRESTOR ) 5 MG tablet TAKE 1 TABLET(5 MG) BY MOUTH DAILY   Syringe/Needle, Disp, 22G X 1-1/2 1 ML MISC Use to inject b12 monthly   Current Facility-Administered Medications for the 05/17/24 encounter (Office Visit) with Tamea Dedra CROME, MD  Medication   cyanocobalamin  (VITAMIN B12) injection 1,000 mcg    Immunization History  Administered Date(s) Administered   Fluad Quad(high Dose 65+) 07/21/2019, 07/17/2020, 08/23/2021, 07/28/2022   Fluad Trivalent(High Dose 65+) 06/17/2023   Influenza, High Dose Seasonal PF 06/17/2018   Influenza-Unspecified 08/09/2015, 06/18/2016, 07/17/2017   PFIZER Comirnaty(Gray Top)Covid-19 Tri-Sucrose Vaccine 05/02/2021   PFIZER(Purple Top)SARS-COV-2 Vaccination 11/27/2019, 12/27/2019, 08/06/2020   Pneumococcal Conjugate-13 09/25/2014    Pneumococcal-Unspecified 04/07/2012   Td 09/25/2014        Objective:     BP 114/66 (BP Location: Left Arm, Patient Position: Sitting, Cuff Size: Normal)   Pulse 78   Temp 98.2 F (36.8 C) (Oral)   Ht 5' 9 (1.753 m)   Wt 201 lb (91.2 kg)   LMP  (LMP Unknown)   SpO2 98%   BMI 29.68 kg/m   SpO2: 98 %  GENERAL: Well-developed, well-nourished woman, no acute distress.  Fully ambulatory.  No conversational dyspnea. HEAD: Normocephalic, atraumatic.  EYES: Pupils equal, round, reactive to light.  No scleral icterus.  MOUTH: Multiple caps.  Partial dentures lower.  Otherwise teeth intact.  Oral mucosa moist.  No thrush. NECK: Supple. No thyromegaly. Trachea midline. No JVD.  No adenopathy. PULMONARY: Good air entry bilaterally.  No adventitious sounds. CARDIOVASCULAR: S1 and S2. Regular rate and rhythm.  No rubs, murmurs or gallops heard. ABDOMEN: Benign. MUSCULOSKELETAL: No joint deformity, no clubbing, no edema.  NEUROLOGIC: No overt focal deficit, no gait disturbance, speech is fluent. SKIN: Intact,warm,dry. PSYCH: Mood and behavior normal.  Chest x-ray PA and lateral 04 February 2024 independently reviewed consistent with COPD:    Assessment & Plan:     ICD-10-CM   1. COPD suggested by initial evaluation (HCC)  J44.9 Pulmonary function test    2. Acute bronchitis with COPD (HCC)  J44.0    J20.9     3. Post-viral cough syndrome  R05.8      Orders Placed This Encounter  Procedures   Pulmonary function test    Standing Status:   Future    Expected Date:   08/17/2024    Expiration Date:   05/17/2025    Where should this test be performed?:   Outpatient Pulmonary  What type of PFT is being ordered?:   Full PFT   Meds ordered this encounter  Medications   azithromycin  (ZITHROMAX ) 250 MG tablet    Sig: Take 2 tablets (500 mg) on  Day 1,  followed by 1 tablet (250 mg) once daily on Days 2 through 5.    Dispense:  6 each    Refill:  0   Discussion    Chronic  obstructive pulmonary disease (COPD) with acute bronchitis COPD with acute exacerbation characterized by yellow sputum production, indicative of acute bronchitis. Currently on Breztri  and uses albuterol  as needed. Reports no significant dyspnea or wheezing. A chest x-ray in April was unremarkable. - Prescribe azithromycin  (Z-Pak) and send to [facility] in [place] for its antibiotic and anti-inflammatory properties. - Continue Breztri  and albuterol  as needed. - Schedule pulmonary function tests to establish a baseline.  Post-viral cough syndrome Experienced a respiratory infection in April with a lingering cough consistent with post-viral cough syndrome. Cough has persisted for several weeks but is expected to resolve spontaneously. History of childhood asthma but no current wheezing. - Monitor symptoms and expect resolution of cough over time.     Will see the patient in follow-up in 3 months time she is to contact us  prior to that time should any new difficulties arise.   Advised if symptoms do not improve or worsen, to please contact office for sooner follow up or seek emergency care.    I spent 60 minutes of dedicated to the care of this patient on the date of this encounter to include pre-visit review of records, face-to-face time with the patient discussing conditions above, post visit ordering of testing, clinical documentation with the electronic health record, making appropriate referrals as documented, and communicating necessary findings to members of the patients care team.   C. Leita Sanders, MD Advanced Bronchoscopy PCCM [facility]    *This note was dictated using voice recognition software/Dragon.  Despite best efforts to proofread, errors can occur which can change the meaning. Any transcriptional errors that result from this process are unintentional and may not be fully corrected at the time of dictation.

## 2024-05-17 NOTE — Patient Instructions (Signed)
 VISIT SUMMARY:  Today, we discussed your persistent cough, which began after a respiratory infection in April. You have been experiencing yellow sputum in the mornings and allergy symptoms. We reviewed your history of COPD, emphysema, and past smoking, as well as your normal chest x-ray from April.  YOUR PLAN:  -CHRONIC OBSTRUCTIVE PULMONARY DISEASE (COPD) WITH ACUTE BRONCHITIS: COPD is a chronic lung condition that makes it hard to breathe, and acute bronchitis is an infection of the airways. You are experiencing an exacerbation of COPD with yellow sputum, indicating bronchitis. We have prescribed azithromycin  (Z-Pak) to help with the infection and inflammation. Please continue using Breztri  and albuterol  as needed. We will also schedule pulmonary function tests to establish a baseline.  -POST-VIRAL COUGH SYNDROME: Post-viral cough syndrome is a lingering cough that follows a respiratory infection. Your cough has persisted since April but is expected to resolve on its own. We will monitor your symptoms and expect the cough to improve over time.  INSTRUCTIONS:  Please pick up your prescription for azithromycin  (Z-Pak) at [facility] in [place]. Continue using Breztri  and albuterol  as needed. We will schedule pulmonary function tests to establish a baseline for your lung function. Monitor your symptoms, and if your cough does not improve or worsens, please contact our office.

## 2024-05-21 ENCOUNTER — Encounter: Payer: Self-pay | Admitting: Pulmonary Disease

## 2024-05-30 ENCOUNTER — Ambulatory Visit (INDEPENDENT_AMBULATORY_CARE_PROVIDER_SITE_OTHER)

## 2024-05-30 DIAGNOSIS — E538 Deficiency of other specified B group vitamins: Secondary | ICD-10-CM

## 2024-05-30 NOTE — Progress Notes (Signed)
 Patient is in office today for a nurse visit for B12 Injection. Patient Injection was given in the  Left deltoid. Patient tolerated injection well.

## 2024-05-31 ENCOUNTER — Ambulatory Visit (INDEPENDENT_AMBULATORY_CARE_PROVIDER_SITE_OTHER): Admitting: Dermatology

## 2024-05-31 ENCOUNTER — Ambulatory Visit

## 2024-05-31 DIAGNOSIS — Z79899 Other long term (current) drug therapy: Secondary | ICD-10-CM

## 2024-05-31 DIAGNOSIS — L309 Dermatitis, unspecified: Secondary | ICD-10-CM | POA: Diagnosis not present

## 2024-05-31 DIAGNOSIS — I872 Venous insufficiency (chronic) (peripheral): Secondary | ICD-10-CM | POA: Diagnosis not present

## 2024-05-31 MED ORDER — OPZELURA 1.5 % EX CREA: TOPICAL_CREAM | CUTANEOUS | 6 refills | Status: AC

## 2024-05-31 MED ORDER — OPZELURA 1.5 % EX CREA: TOPICAL_CREAM | CUTANEOUS | 6 refills | Status: DC

## 2024-05-31 NOTE — Patient Instructions (Addendum)
 For Legs  Stop desoximetasone  0.05 % cream   Start opzelura  cream - (sample given ) - apply topically to legs twice daily   If not get opzelura  is not covered or too expensive  can continue Eucrisa  2 % ointment apply topically to affected areas daily to twice daily. Can store in fridge to help decrease burning after applying  Continue Compression hose daily, put on in AM, remove at night  Continue Lymphedema pump daily   Continue Cerave Itch Relief and or Cerave Sa as needed   Continue lasix  (furosemide ) as prescribed     Due to recent changes in healthcare laws, you may see results of your pathology and/or laboratory studies on MyChart before the doctors have had a chance to review them. We understand that in some cases there may be results that are confusing or concerning to you. Please understand that not all results are received at the same time and often the doctors may need to interpret multiple results in order to provide you with the best plan of care or course of treatment. Therefore, we ask that you please give us  2 business days to thoroughly review all your results before contacting the office for clarification. Should we see a critical lab result, you will be contacted sooner.   If You Need Anything After Your Visit  If you have any questions or concerns for your doctor, please call our main line at [phone] and press option 4 to reach your doctor's medical assistant. If no one answers, please leave a voicemail as directed and we will return your call as soon as possible. Messages left after 4 pm will be answered the following business day.   You may also send us  a message via MyChart. We typically respond to MyChart messages within 1-2 business days.  For prescription refills, please ask your pharmacy to contact our office. Our fax number is [phone].  If you have an urgent issue when the clinic is closed that cannot wait until the next business day, you can page  your doctor at the number below.    Please note that while we do our best to be available for urgent issues outside of office hours, we are not available 24/7.   If you have an urgent issue and are unable to reach us , you may choose to seek medical care at your doctor's office, retail clinic, urgent care center, or emergency room.  If you have a medical emergency, please immediately call 911 or go to the emergency department.  Pager Numbers  - Dr. Hester: [phone]  - Dr. Jackquline: [phone]  - Dr. Claudene: [phone]   - Dr. Raymund: [phone]  In the event of inclement weather, please call our main line at [phone] for an update on the status of any delays or closures.  Dermatology Medication Tips: Please keep the boxes that topical medications come in in order to help keep track of the instructions about where and how to use these. Pharmacies typically print the medication instructions only on the boxes and not directly on the medication tubes.   If your medication is too expensive, please contact our office at [phone] option 4 or send us  a message through MyChart.   We are unable to tell what your co-pay for medications will be in advance as this is different depending on your insurance coverage. However, we may be able to find a substitute medication at lower cost or fill out paperwork to get insurance to cover a needed  medication.   If a prior authorization is required to get your medication covered by your insurance company, please allow us  1-2 business days to complete this process.  Drug prices often vary depending on where the prescription is filled and some pharmacies may offer cheaper prices.  The website www.goodrx.com contains coupons for medications through different pharmacies. The prices here do not account for what the cost may be with help from insurance (it may be cheaper with your insurance), but the website can give you the price if you did not  use any insurance.  - You can print the associated coupon and take it with your prescription to the pharmacy.  - You may also stop by our office during regular business hours and pick up a GoodRx coupon card.  - If you need your prescription sent electronically to a different pharmacy, notify our office through [facility] or by phone at [phone] option 4.     Si Usted Necesita Algo Despus de Su Visita  Tambin puede enviarnos un mensaje a travs de Clinical cytogeneticist. Por lo general respondemos a los mensajes de MyChart en el transcurso de 1 a 2 das hbiles.  Para renovar recetas, por favor pida a su farmacia que se ponga en contacto con nuestra oficina. Randi lakes de fax es [place] [phone].  Si tiene un asunto urgente cuando la clnica est cerrada y que no puede esperar hasta el siguiente da hbil, puede llamar/localizar a su doctor(a) al nmero que aparece a continuacin.   Por favor, tenga en cuenta que aunque hacemos todo lo posible para estar disponibles para asuntos urgentes fuera del horario de [place], no estamos disponibles las 24 horas del da, los 7 [address]  [address] de la [place].   Si tiene un problema urgente y no puede comunicarse con nosotros, puede optar por buscar atencin mdica  en el consultorio de su doctor(a), en una clnica privada, en un centro de atencin urgente o en una sala de emergencias.  Si tiene Engineer, drilling, por favor llame inmediatamente al 911 o vaya a la sala de emergencias.  Nmeros de bper  - Dr. Hester: [phone]  - Dra. Jackquline: [phone]  - Dr. Claudene: [phone]  - Dra. Kitts: [phone]  En caso de inclemencias del [place], por favor llame a nuestra lnea principal al [phone] para una actualizacin sobre el estado de cualquier retraso o cierre.  Consejos para la medicacin en dermatologa: Por favor, guarde las cajas en las que vienen los medicamentos de uso tpico para ayudarle a seguir las instrucciones sobre dnde  y cmo usarlos. Las farmacias generalmente imprimen las instrucciones del medicamento slo en las cajas y no directamente en los tubos del [place].   Si su medicamento es muy caro, por favor, pngase en contacto con landry rieger llamando al [phone] y presione la opcin 4 o envenos un mensaje a travs de Clinical cytogeneticist.   No podemos decirle cul ser su copago por los medicamentos por adelantado ya que esto es diferente dependiendo de la cobertura de su seguro. Sin embargo, es posible que podamos encontrar un medicamento sustituto a Audiological scientist un formulario para que el seguro cubra el medicamento que se considera necesario.   Si se requiere una autorizacin previa para que su compaa de seguros malta su medicamento, por favor permtanos de 1 a 2 das hbiles para completar este proceso.  Los precios de los medicamentos varan con frecuencia dependiendo del Environmental consultant de dnde se surte la receta y Careers adviser pueden ofrecer  precios ms baratos.  El sitio web www.goodrx.com tiene cupones para medicamentos de Health and safety inspector. Los precios aqu no tienen en cuenta lo que podra costar con la ayuda del seguro (puede ser ms barato con su seguro), pero el sitio web puede darle el precio si no utiliz Tourist information centre manager.  - Puede imprimir el cupn correspondiente y llevarlo con su receta a la farmacia.  - Tambin puede pasar por nuestra oficina durante el horario de atencin regular y Education officer, museum una tarjeta de cupones de GoodRx.  - Si necesita que su receta se enve electrnicamente a una farmacia diferente, informe a nuestra oficina a travs de MyChart de [place] o por telfono llamando al [phone] y presione la opcin 4.

## 2024-05-31 NOTE — Progress Notes (Signed)
   Follow-Up Visit   Subjective  Sharon [name] is a 78 y.o. female who presents for the following:  b/l stasis dermatitis at lower legs, redness has gone down some with topical steroid desoximethasone 0.05% cream once daily, but hasn't been using lymphedema pumps this past week due to husband's illness.  Pt has used tacrolimus , Eucrisa , allergic rxn to Elidel  cream in past     The following portions of the chart were reviewed this encounter and updated as appropriate: medications, allergies, medical history  Review of Systems:  No other skin or systemic complaints except as noted in HPI or Assessment and Plan.  Objective  Well appearing patient in no apparent distress; mood and affect are within normal limits.   A focused examination was performed of the following areas: legs  Relevant exam findings are noted in the Assessment and Plan.    Assessment & Plan   STASIS DERMATITIS Exam: pitting edema at ankles. Erythema L> R lower legs, no scale  Chronic and persistent condition with duration or expected duration over one year. Condition is improving with treatment but not currently at goal.    Stasis in the legs causes chronic leg swelling, which may result in itchy or painful rashes, skin discoloration, skin texture changes, and sometimes ulceration.  Recommend daily graduated compression hose/stockings- easiest to put on first thing in morning, remove at bedtime.  Elevate legs as much as possible. Avoid salt/sodium rich foods.   Treatment Plan: D/C desoximethasone cream, may restart every day prn flares up to 4 weeks, Caution skin atrophy with long-term use.  Start Opzelura  cream - apply topically to aas both legs twice daily, 2 samples given  If opzelura  too expensive Can continue Eucrisa  2 % ointment apply topically to affected areas daily to twice daily. Can store in fridge to help decrease burning after applying  Compression hose daily, put on in AM, remove at  night Continue Lymphedema pump daily   Continue Cerave Itch Relief cream prn   Continue lasix  (furosemide ) as prescribed   Topical steroids (such as triamcinolone , fluocinolone, fluocinonide, mometasone , clobetasol, halobetasol, betamethasone , hydrocortisone) can cause thinning and lightening of the skin if they are used for too long in the same area. Your physician has selected the right strength medicine for your problem and area affected on the body. Please use your medication only as directed by your physician to prevent side effects.     DERMATITIS   Related Medications Ruxolitinib Phosphate  (OPZELURA ) 1.5 % CREA Apply topically to legs twice daily for dermatitis  Return for 2 to 3 month stasis dermatitis follow up.  I, Eleanor Blush, CMA, am acting as scribe for Rexene Rattler, MD.   Documentation: I have reviewed the above documentation for accuracy and completeness, and I agree with the above.  Rexene Rattler, MD

## 2024-06-14 ENCOUNTER — Encounter: Payer: Self-pay | Admitting: Dermatology

## 2024-06-20 ENCOUNTER — Encounter: Payer: Self-pay | Admitting: Emergency Medicine

## 2024-06-20 ENCOUNTER — Other Ambulatory Visit: Payer: Self-pay

## 2024-06-20 ENCOUNTER — Emergency Department
Admission: EM | Admit: 2024-06-20 | Discharge: 2024-06-20 | Disposition: A | Discharge status: Home / Self Care | Attending: Emergency Medicine | Admitting: Emergency Medicine

## 2024-06-20 DIAGNOSIS — R29818 Other symptoms and signs involving the nervous system: Secondary | ICD-10-CM | POA: Diagnosis not present

## 2024-06-20 DIAGNOSIS — R200 Anesthesia of skin: Secondary | ICD-10-CM | POA: Diagnosis not present

## 2024-06-20 DIAGNOSIS — Z7902 Long term (current) use of antithrombotics/antiplatelets: Secondary | ICD-10-CM | POA: Diagnosis not present

## 2024-06-20 DIAGNOSIS — R531 Weakness: Secondary | ICD-10-CM | POA: Insufficient documentation

## 2024-06-20 DIAGNOSIS — R2990 Unspecified symptoms and signs involving the nervous system: Secondary | ICD-10-CM

## 2024-06-20 NOTE — ED Triage Notes (Signed)
 Patient reports sudden onset of RUE weakness at 4:45am. She reports her right arm felt weak and cold along with trouble swallowing and nausea. Symptoms resolved after 5 minutes. Hx of strokes - on blood thinners.

## 2024-06-20 NOTE — Discharge Instructions (Signed)
 As we discussed, your symptoms have completely resolved and your examination is reassuring.  However, it is possible that you had a transient ischemic attack (TIA), or mini stroke.  We discussed doing a full workup including an MRI and even admission to the hospital, but since your symptoms have completely resolved and you already take all of your medications from your previous strokes, her preference is to hold off on additional evaluation at this time.  We think that is very reasonable and appropriate.  However, it is important that you follow-up with your neurologist and/or your primary care provider at the next available opportunity and return immediately to the emergency department if you develop new or worsening symptoms that concern you.

## 2024-06-20 NOTE — ED Provider Notes (Signed)
 [facility] Provider Note    Event Date/Time   First MD Initiated Contact with Patient 06/20/24 [id number]     (approximate)   History   Extremity Weakness   HPI Sharon [name] is a 78 y.o. female who has a prior history of multiple strokes and who is on medical therapy including Plavix .  Her neurologist is Dr. Lane.  She presents this morning by private vehicle for evaluation of an acute episode that occurred at 4:45 AM.  She was helping her husband get ready to go to dialysis when she said that her right arm suddenly became very cold, as if she was standing under an air conditioner vent.  It did not feel weak and she did not have problems moving it, but it felt cold and a bit numb.  She also felt like her throat was quite dry and she was not sure if she was having difficulty swallowing.    The symptoms completely resolved after 5 minutes.  She and her husband talked about it and given her history she decided to come to the emergency department for evaluation.  She is ambulatory at her baseline and is able to walk.  She is not having any balance difficulties.  No visual changes.  No new word finding difficulties; she said that since her last stroke in 2023 she sometimes has to think hard about specific words before she can bring them up, but that it has not changed tonight.  She is not having any difficulty understanding anyone, speaking, nor swallowing.  She has no chest pain or shortness of breath and has not had a recent fever.  No recent falls.  No nausea or vomiting.     Physical Exam   Triage Vital Signs: ED Triage Vitals  Encounter Vitals Group     BP 06/20/24 0611 (!) 153/89     Girls Systolic BP Percentile --      Girls Diastolic BP Percentile --      Boys Systolic BP Percentile --      Boys Diastolic BP Percentile --      Pulse Rate 06/20/24 0611 77     Resp 06/20/24 0611 20     Temp 06/20/24 0628 98.5 F (36.9 C)     Temp src --      SpO2 06/20/24  0611 100 %     Weight --      Height --      Head Circumference --      Peak Flow --      Pain Score 06/20/24 0558 0     Pain Loc --      Pain Education --      Exclude from Growth Chart --     Most recent vital signs: Vitals:   06/20/24 0630 06/20/24 0632  BP:  128/77  Pulse: 69 70  Resp:  18  Temp:    SpO2: 98% 98%    General: Awake, no distress.  Well-appearing, conversant and pleasant.   CV:  Good peripheral perfusion.  Regular rate and rhythm.  She has easily palpable radial pulses bilaterally. Resp:  Normal effort. Speaking easily and comfortably, no accessory muscle usage nor intercostal retractions.   Abd:  No distention.  Neuro:  Pupils are equal and reactive.  No aphasia nor dysarthria.  Patient was able to drink water  from a cup without any difficulty, thus passing a stroke swallow screen study administered by me.  She is having no acute  word finding difficulties and is alert and oriented x 3.  She has good bilateral grip strength and major muscle group strength in upper and lower extremities.  She is able to stand and ambulate on her own.  Negative Romberg, no pronator drift.  No dysmetria on finger-to-nose testing.  NIH stroke scale is 0.   ED Results / Procedures / Treatments   Labs (all labs ordered are listed, but only abnormal results are displayed) Labs Reviewed - No data to display    PROCEDURES:  Critical Care performed: No  Procedures    IMPRESSION / MDM / ASSESSMENT AND PLAN / ED COURSE  I reviewed the triage vital signs and the nursing notes.                              Differential diagnosis includes, but is not limited to, transient neuropathy, transient vascular disturbance, arterial or venous occlusion, CVA, TIA, recrudescence of CVA symptoms in the setting of an acute infectious process.  Patient's presentation is most consistent with acute presentation with potential threat to life or bodily function.    Vital signs are stable and  all within normal limits.  Patient has a very reassuring physical/neurological exam with NIH stroke scale of 0.  Her symptoms were very brief and had resolved by the time she and her husband left home.  I had an extensive conversation with the patient about the plan of care tonight.  My recommendation was for a full ED stroke workup including the labs and an MR brain.  I explained that there was a very little benefit to a CT of the head without contrast, because I think that there is virtually no chance that she has an acute hemorrhage or other abnormality identifiable on CT.  I explained about the difference between a TIA and a CVA in terms of strokelike symptoms that have completely resolved and are without MRI findings.  I explained that lab work was also very unlikely to be beneficial particularly because she is compliant with her medications, eating and drinking normally, etc.  The patient is extremely claustrophobic and has had to be sedated with an open MRI in the past.  When I started talking to her about an MRI, she became extremely anxious, her heart rate went up, and she began shaking.  I offered to administer Versed  to help her relax but even so she was extremely upset at the prospect.  After we talked for an extended period of time, she expressed her desire to avoid the MRI.  She understands that I cannot prove to her that she has not had a CVA.  However we also discussed the fact that even if the MRI came back slightly positive for a small CVA, given that she has no deficits, there is very little that can or should be done, given that she is already on maximal medical therapy.  Her husband is in agreement as well and the patient wishes to forego any evaluation at this time given that her symptoms have resolved.  She has the capacity to make this decision even though it is against my medical advice, but I think it is very reasonable under the circumstances.  She knows to follow-up with her  neurologist at the next available opportunity and to return immediately should she develop new or worsening symptoms.  Patient is asymptomatic and hemodynamically stable at time of discharge and she knows that she can  return at any time for additional workup.       FINAL CLINICAL IMPRESSION(S) / ED DIAGNOSES   Final diagnoses:  Stroke-like symptoms     Rx / DC Orders   ED Discharge Orders     None        Note:  This document was prepared using Dragon voice recognition software and may include unintentional dictation errors.   Gordan Huxley, MD 06/20/24 [phone]

## 2024-06-23 ENCOUNTER — Other Ambulatory Visit: Payer: Self-pay | Admitting: Nurse Practitioner

## 2024-06-24 NOTE — Telephone Encounter (Signed)
 Requested Prescriptions  Pending Prescriptions Disp Refills   albuterol  (VENTOLIN  HFA) 108 (90 Base) MCG/ACT inhaler [Pharmacy Med Name: ALBUTEROL  HFA INH (200 PUFFS) 8.5GM] 8.5 g 2    Sig: INHALE 2 PUFFS BY MOUTH EVERY 6 HOURS AS NEEDED FOR WHEEZING OR SHORTNESS OF BREATH     Pulmonology:  Beta Agonists 2 Passed - 06/24/2024  9:56 AM      Passed - Last BP in normal range    BP Readings from Last 1 Encounters:  06/20/24 128/77         Passed - Last Heart Rate in normal range    Pulse Readings from Last 1 Encounters:  06/20/24 70         Passed - Valid encounter within last 12 months    Recent Outpatient Visits           1 month ago Type 2 diabetes mellitus with morbid obesity (HCC)   [place] [facility] [place], Bug Tussle T, NP   2 months ago Centrilobular emphysema (HCC)   [place] [facility] [place], Melanie T, NP   3 months ago COPD exacerbation [facility])   [place] [facility] Herold Hadassah SQUIBB, MD   4 months ago Centrilobular emphysema [facility])   [place] [facility] [place], Melanie T, NP   4 months ago Centrilobular emphysema [facility])   [place] [facility] [place] Melanie DASEN, NP       Future Appointments             In 3 weeks Jackquline Sawyer, MD [facility]

## 2024-06-29 ENCOUNTER — Encounter: Payer: Self-pay | Admitting: Nurse Practitioner

## 2024-06-29 ENCOUNTER — Telehealth: Payer: Self-pay

## 2024-06-29 NOTE — Telephone Encounter (Unsigned)
 Copied from CRM #[id number]. Topic: General - Other >> Jun 29, 2024  9:58 AM Sharon [name] wrote: Reason for CRM: Patient is calling in because she wants to know if she can have her Flu Shot done with her B12 shot tomorrow.

## 2024-06-30 ENCOUNTER — Ambulatory Visit

## 2024-06-30 ENCOUNTER — Telehealth: Payer: Self-pay

## 2024-06-30 DIAGNOSIS — Z23 Encounter for immunization: Secondary | ICD-10-CM

## 2024-06-30 DIAGNOSIS — E538 Deficiency of other specified B group vitamins: Secondary | ICD-10-CM

## 2024-06-30 NOTE — Progress Notes (Signed)
 Patient is in office today for a nurse visit for B12 Injection. Patient Injection was given in the  Right deltoid. Patient tolerated injection well.  Patient also received her high dose flu vaccine to the left deltiod. Patient tolerated well with no concerns.

## 2024-06-30 NOTE — Telephone Encounter (Signed)
 Completed.

## 2024-06-30 NOTE — Telephone Encounter (Signed)
 Good Morning  I returned patients call in regards to scheduling her annual Lung CT. She turned 68 in February and is no longer eligible for our program. I have discussed this with her. I told her if a yearly CT is still warranted she could discuss this with her PCP or Pulmonologist.   Dr. KANDICE, you will see patient on 08/23/2024 for an OV and she will complete a PFT. If the patient needs a CT prior to her appt, please let me know and I will be happy to place the order for you.   Jolene, I told the patient I would message you as well as I know she had reached out to you yesterday about scheduling her CT.   Thanks and have a great rest of your day.   Isaiah, RN

## 2024-07-10 ENCOUNTER — Encounter: Payer: Self-pay | Admitting: Pulmonary Disease

## 2024-07-11 ENCOUNTER — Encounter: Payer: Self-pay | Admitting: Pulmonary Disease

## 2024-07-11 NOTE — Telephone Encounter (Signed)
 She did not show any significant changes on the CTs that were done before for lung cancer screening.  She should be okay without a scan this year.  For the pulmonary function testing if she has difficulty with the lung volumes part we can always accommodate and do testing taking into account her issues with claustrophobia.

## 2024-07-19 ENCOUNTER — Ambulatory Visit: Admitting: Dermatology

## 2024-07-19 DIAGNOSIS — B070 Plantar wart: Secondary | ICD-10-CM

## 2024-07-19 DIAGNOSIS — I872 Venous insufficiency (chronic) (peripheral): Secondary | ICD-10-CM

## 2024-07-19 DIAGNOSIS — L089 Local infection of the skin and subcutaneous tissue, unspecified: Secondary | ICD-10-CM | POA: Diagnosis not present

## 2024-07-19 DIAGNOSIS — D1801 Hemangioma of skin and subcutaneous tissue: Secondary | ICD-10-CM | POA: Diagnosis not present

## 2024-07-19 DIAGNOSIS — D485 Neoplasm of uncertain behavior of skin: Secondary | ICD-10-CM | POA: Diagnosis not present

## 2024-07-19 NOTE — Patient Instructions (Addendum)
 Recommend using Curad Mediplast pads. Cut to fit wart or callus. Cover with Elastoplast waterproof tape or any waterproof band-aid. Change every 3 to 4 days, or sooner if necessary.  Treatment may require several months of regular use before results are seen.   Cryotherapy Aftercare  Wash gently with soap and water  everyday.   Apply Vaseline and Band-Aid daily until healed.    Wound Care Instructions  Cleanse wound gently with soap and water  once a day then pat dry with clean gauze. Apply a thin coat of Petrolatum (petroleum jelly, Vaseline) over the wound (unless you have an allergy to this). We recommend that you use a new, sterile tube of Vaseline. Do not pick or remove scabs. Do not remove the yellow or white healing tissue from the base of the wound.  Cover the wound with fresh, clean, nonstick gauze and secure with paper tape. You may use Band-Aids in place of gauze and tape if the wound is small enough, but would recommend trimming much of the tape off as there is often too much. Sometimes Band-Aids can irritate the skin.  You should call the office for your biopsy report after 1 week if you have not already been contacted.  If you experience any problems, such as abnormal amounts of bleeding, swelling, significant bruising, significant pain, or evidence of infection, please call the office immediately.  FOR ADULT SURGERY PATIENTS: If you need something for pain relief you may take 1 extra strength Tylenol  (acetaminophen ) AND 2 Ibuprofen (200mg  each) together every 4 hours as needed for pain. (do not take these if you are allergic to them or if you have a reason you should not take them.) Typically, you may only need pain medication for 1 to 3 days.    Due to recent changes in healthcare laws, you may see results of your pathology and/or laboratory studies on MyChart before the doctors have had a chance to review them. We understand that in some cases there may be results that are  confusing or concerning to you. Please understand that not all results are received at the same time and often the doctors may need to interpret multiple results in order to provide you with the best plan of care or course of treatment. Therefore, we ask that you please give us  2 business days to thoroughly review all your results before contacting the office for clarification. Should we see a critical lab result, you will be contacted sooner.   If You Need Anything After Your Visit  If you have any questions or concerns for your doctor, please call our main line at [phone] and press option 4 to reach your doctor's medical assistant. If no one answers, please leave a voicemail as directed and we will return your call as soon as possible. Messages left after 4 pm will be answered the following business day.   You may also send us  a message via MyChart. We typically respond to MyChart messages within 1-2 business days.  For prescription refills, please ask your pharmacy to contact our office. Our fax number is [phone].  If you have an urgent issue when the clinic is closed that cannot wait until the next business day, you can page your doctor at the number below.    Please note that while we do our best to be available for urgent issues outside of office hours, we are not available 24/7.   If you have an urgent issue and are unable to reach us , you  may choose to seek medical care at your doctor's office, retail clinic, urgent care center, or emergency room.  If you have a medical emergency, please immediately call 911 or go to the emergency department.  Pager Numbers  - Dr. Hester: [phone]  - Dr. Jackquline: [phone]  - Dr. Claudene: [phone]   - Dr. Raymund: [phone]  In the event of inclement weather, please call our main line at [phone] for an update on the status of any delays or closures.  Dermatology Medication Tips: Please keep the boxes that topical  medications come in in order to help keep track of the instructions about where and how to use these. Pharmacies typically print the medication instructions only on the boxes and not directly on the medication tubes.   If your medication is too expensive, please contact our office at [phone] option 4 or send us  a message through MyChart.   We are unable to tell what your co-pay for medications will be in advance as this is different depending on your insurance coverage. However, we may be able to find a substitute medication at lower cost or fill out paperwork to get insurance to cover a needed medication.   If a prior authorization is required to get your medication covered by your insurance company, please allow us  1-2 business days to complete this process.  Drug prices often vary depending on where the prescription is filled and some pharmacies may offer cheaper prices.  The website www.goodrx.com contains coupons for medications through different pharmacies. The prices here do not account for what the cost may be with help from insurance (it may be cheaper with your insurance), but the website can give you the price if you did not use any insurance.  - You can print the associated coupon and take it with your prescription to the pharmacy.  - You may also stop by our office during regular business hours and pick up a GoodRx coupon card.  - If you need your prescription sent electronically to a different pharmacy, notify our office through [facility] or by phone at [phone] option 4.     Si Usted Necesita Algo Despus de Su Visita  Tambin puede enviarnos un mensaje a travs de Clinical cytogeneticist. Por lo general respondemos a los mensajes de MyChart en el transcurso de 1 a 2 das hbiles.  Para renovar recetas, por favor pida a su farmacia que se ponga en contacto con nuestra oficina. Randi lakes de fax es [place] [phone].  Si tiene un asunto urgente cuando la clnica est  cerrada y que no puede esperar hasta el siguiente da hbil, puede llamar/localizar a su doctor(a) al nmero que aparece a continuacin.   Por favor, tenga en cuenta que aunque hacemos todo lo posible para estar disponibles para asuntos urgentes fuera del horario de [place], no estamos disponibles las 24 horas del da, los 7 [address]  [address] de la [place].   Si tiene un problema urgente y no puede comunicarse con nosotros, puede optar por buscar atencin mdica  en el consultorio de su doctor(a), en una clnica privada, en un centro de atencin urgente o en una sala de emergencias.  Si tiene Engineer, drilling, por favor llame inmediatamente al 911 o vaya a la sala de emergencias.  Nmeros de bper  - Dr. Hester: [phone]  - Dra. Jackquline: [phone]  - Dr. Claudene: [phone]  - Dra. Kitts: [phone]  En caso de inclemencias del [place], por favor llame a nuestra lnea principal al [phone]  para [employer] de cualquier retraso o cierre.  Consejos para la medicacin en dermatologa: Por favor, guarde las cajas en las que vienen los medicamentos de uso tpico para ayudarle a seguir las instrucciones sobre dnde y cmo usarlos. Las farmacias generalmente imprimen las instrucciones del medicamento slo en las cajas y no directamente en los tubos del [place].   Si su medicamento es muy caro, por favor, pngase en contacto con landry rieger llamando al [phone] y presione la opcin 4 o envenos un mensaje a travs de Clinical cytogeneticist.   No podemos decirle cul ser su copago por los medicamentos por adelantado ya que esto es diferente dependiendo de la cobertura de su seguro. Sin embargo, es posible que podamos encontrar un medicamento sustituto a Audiological scientist un formulario para que el seguro cubra el medicamento que se considera necesario.   Si se requiere una autorizacin previa para que su compaa de seguros malta su medicamento, por favor permtanos de 1  a 2 das hbiles para completar este proceso.  Los precios de los medicamentos varan con frecuencia dependiendo del Environmental consultant de dnde se surte la receta y alguna farmacias pueden ofrecer precios ms baratos.  El sitio web www.goodrx.com tiene cupones para medicamentos de Health and safety inspector. Los precios aqu no tienen en cuenta lo que podra costar con la ayuda del seguro (puede ser ms barato con su seguro), pero el sitio web puede darle el precio si no utiliz Tourist information centre manager.  - Puede imprimir el cupn correspondiente y llevarlo con su receta a la farmacia.  - Tambin puede pasar por nuestra oficina durante el horario de atencin regular y Education officer, museum una tarjeta de cupones de GoodRx.  - Si necesita que su receta se enve electrnicamente a una farmacia diferente, informe a nuestra oficina a travs de MyChart de [place] o por telfono llamando al [phone] y presione la opcin 4.

## 2024-07-19 NOTE — Progress Notes (Signed)
 Follow-Up Visit   Subjective  Sharon [name] is a 78 y.o. female who presents for the following: 2 month follow-up Stasis Dermatitis. She is using Opzelura  cream and Eucrisa  ointment as needed. She wears compression daily and lymphedema pump. She has a couple of spots on the right lateral foot to check today, painful; new bump on the left foot toe.   The patient has spots, moles and lesions to be evaluated, some may be new or changing.  The following portions of the chart were reviewed this encounter and updated as appropriate: medications, allergies, medical history  Review of Systems:  No other skin or systemic complaints except as noted in HPI or Assessment and Plan.  Objective  Well appearing patient in no apparent distress; mood and affect are within normal limits.  A focused examination was performed of the following areas: Legs, feet  Relevant physical exam findings are noted in the Assessment and Plan.  L 4th dorsal toe 5 mm firm red papule   Right Lateral Plantar Foot x 2 (2) Firm keratotic papules x 2 lateral R plantar foot -- Discussed viral etiology and contagion.   Assessment & Plan  STASIS DERMATITIS Exam: Erythema with pitting edema at L> R lower legs.   Chronic and persistent condition with duration or expected duration over one year. Condition is improving with treatment but not currently at goal.    Stasis in the legs causes chronic leg swelling, which may result in itchy or painful rashes, skin discoloration, skin texture changes, and sometimes ulceration.  Recommend daily graduated compression hose/stockings- easiest to put on first thing in morning, remove at bedtime.  Elevate legs as much as possible. Avoid salt/sodium rich foods.   Treatment Plan: Continue Opzelura  cream - apply topically to aas both legs twice daily, pt has Rx  Can continue Eucrisa  2 % ointment apply topically to affected areas daily to twice daily. Can store in fridge to help decrease  burning after applying   Compression hose daily, put on in AM, remove at night.  Continue Lymphedema pump daily   Continue Cerave Itch Relief cream prn   Continue lasix  (furosemide ) as prescribed   Topical steroids (such as triamcinolone , fluocinolone, fluocinonide, mometasone , clobetasol, halobetasol, betamethasone , hydrocortisone) can cause thinning and lightening of the skin if they are used for too long in the same area. Your physician has selected the right strength medicine for your problem and area affected on the body. Please use your medication only as directed by your physician to prevent side effects.      NEOPLASM OF UNCERTAIN BEHAVIOR OF SKIN L 4th dorsal toe Epidermal / dermal shaving  Lesion diameter (cm):  0.5 Informed consent: discussed and consent obtained   Patient was prepped and draped in usual sterile fashion: Area prepped with alcohol. Anesthesia: the lesion was anesthetized in a standard fashion   Anesthetic:  1% lidocaine  w/ epinephrine  1-100,000 buffered w/ 8.4% NaHCO3 Instrument used: flexible razor blade   Hemostasis achieved with: pressure, aluminum chloride and electrodesiccation   Outcome: patient tolerated procedure well   Post-procedure details: wound care instructions given   Post-procedure details comment:  Ointment and small bandage applied  Specimen 1 - Surgical pathology Differential Diagnosis: Digital Mucous Cyst vs Pyogenic Granuloma vs Blood Blister R/O BCC Check Margins: No PLANTAR WART (2) Right Lateral Plantar Foot x 2 (2) Viral Wart (HPV) Counseling  Discussed viral / HPV (Human Papilloma Virus) etiology and risk of spread /infectivity to other areas of body as  well as to other people.  Multiple treatments and methods may be required to clear warts and it is possible treatment may not be successful.  Treatment risks include discoloration; scarring and there is still potential for wart recurrence. Destruction of lesion - Right Lateral  Plantar Foot x 2 (2)  Destruction method: cryotherapy   Informed consent: discussed and consent obtained   Lesion destroyed using liquid nitrogen: Yes   Region frozen until ice ball extended beyond lesion: Yes   Outcome: patient tolerated procedure well with no complications   Post-procedure details: wound care instructions given   Additional details:  Prior to procedure, discussed risks of blister formation, small wound, skin dyspigmentation, or rare scar following cryotherapy. Recommend Vaseline ointment to treated areas while healing.     Return 2-3 months, for Stasis Derm, Wart; also check spot on the left popliteal area.  IAndrea Kerns, CMA, am acting as scribe for Rexene Rattler, MD .   Documentation: I have reviewed the above documentation for accuracy and completeness, and I agree with the above.  Rexene Rattler, MD

## 2024-07-20 LAB — SURGICAL PATHOLOGY

## 2024-07-23 ENCOUNTER — Ambulatory Visit: Payer: Self-pay | Admitting: Dermatology

## 2024-07-25 NOTE — Telephone Encounter (Signed)
 Advised patient biopsy of the L 4th dorsal toe was benign trauma related to blood blister. No further treatment needed.

## 2024-07-25 NOTE — Telephone Encounter (Signed)
-----   Message from Rexene Rattler sent at 07/23/2024 12:44 PM EDT ----- 1. Skin, L 4th dorsal toe :       PROBABLE OLD THROMBOSED ANGIOMA/INFARCTED BLISTER, SEE DESCRIPTION   Benign, probable trauma related blood blister - please call patient ----- Message ----- From: Interface, Lab In Three Zero One Sent: 07/20/2024   3:07 PM EDT To: Rexene Rattler, MD

## 2024-07-29 ENCOUNTER — Ambulatory Visit

## 2024-07-29 DIAGNOSIS — E538 Deficiency of other specified B group vitamins: Secondary | ICD-10-CM

## 2024-07-29 MED ORDER — CYANOCOBALAMIN 1000 MCG/ML IJ SOLN
1000.0000 ug | Freq: Once | INTRAMUSCULAR | Status: AC
  Administered 2024-07-29: 1000 ug via INTRAMUSCULAR

## 2024-07-29 NOTE — Progress Notes (Signed)
 Patient is in office today for a nurse visit for B12 Injection. Patient Injection was given in the  Right deltoid. Patient tolerated injection well.

## 2024-08-11 ENCOUNTER — Other Ambulatory Visit: Payer: Self-pay | Admitting: Nurse Practitioner

## 2024-08-12 ENCOUNTER — Encounter: Payer: Self-pay | Admitting: Nurse Practitioner

## 2024-08-12 ENCOUNTER — Other Ambulatory Visit: Payer: Self-pay | Admitting: Nurse Practitioner

## 2024-08-12 ENCOUNTER — Telehealth: Payer: Self-pay | Admitting: Nurse Practitioner

## 2024-08-12 NOTE — Telephone Encounter (Signed)
 Copied from CRM #[id number]. Topic: Clinical - Medication Refill >> Aug 12, 2024  1:18 PM Amy B wrote: Medication: BREZTRI  AEROSPHERE 160-9-4.8 MCG/ACT AERO  Has the patient contacted their pharmacy? Yes (Agent: If no, request that the patient contact the pharmacy for the refill. If patient does not wish to contact the pharmacy document the reason why and proceed with request.) (Agent: If yes, when and what did the pharmacy advise?)  This is the patient's preferred pharmacy:  [id number] [place], [place] - [address] AT [facility] [address] [place] [address] [place] [address] Phone: [phone] Fax: [phone]  Is this the correct pharmacy for this prescription? Yes If no, delete pharmacy and type the correct one.   Has the prescription been filled recently? No  Is the patient out of the medication? No  Has the patient been seen for an appointment in the last year OR does the patient have an upcoming appointment? Yes  Can we respond through MyChart? No  Agent: Please be advised that Rx refills may take up to 3 business days. We ask that you follow-up with your pharmacy.

## 2024-08-12 NOTE — Telephone Encounter (Signed)
 Requested medication (s) are due for refill today: na   Requested medication (s) are on the active medication list: yes  Last refill:  09/08/23 #10.7g 11 refills  Future visit scheduled: yes 08/17/24  Notes to clinic:  medication not assigned to a protocol. Do you want to refill Rx?     Requested Prescriptions  Pending Prescriptions Disp Refills   BREZTRI  AEROSPHERE 160-9-4.8 MCG/ACT AERO inhaler [Pharmacy Med Name: BREZTRI  AERO SPHERE INHALER 120 INH] 10.7 g 11    Sig: INHALE 2 PUFFS BY MOUTH TWICE DAILY AS DIRECTED     Off-Protocol Failed - 08/12/2024  3:41 PM      Failed - Medication not assigned to a protocol, review manually.      Passed - Valid encounter within last 12 months    Recent Outpatient Visits           2 months ago Type 2 diabetes mellitus with morbid obesity (HCC)   [place] [facility] [place], Everet[place] T, NP   4 months ago Centrilobular emphysema (HCC)   [place] [facility] Val[place], Melanie T, NP   4 months ago COPD exacerbation [facility])   [place] [facility] Herold Hadassah SQUIBB, MD   6 months ago Centrilobular emphysema [facility])   [place] [facility] [place], Melanie T, NP   6 months ago Centrilobular emphysema [facility])   [place] [facility] Valerio Melanie DASEN, NP       Future Appointments             In 1 month Dunn, Bernardino HERO, PA-C [facility] [place]   In 2 months Jackquline Sawyer, MD [facility]

## 2024-08-13 NOTE — Patient Instructions (Signed)

## 2024-08-15 NOTE — Telephone Encounter (Signed)
 Requested medication (s) are due for refill today: yes  Requested medication (s) are on the active medication list: yes  Last refill:  09/08/23  Future visit scheduled: yes  Notes to clinic:  Medication not assigned to a protocol, review manually.      Requested Prescriptions  Pending Prescriptions Disp Refills   budesonide -glycopyrrolate -formoterol  (BREZTRI  AEROSPHERE) 160-9-4.8 MCG/ACT AERO inhaler 10.7 g 11     Off-Protocol Failed - 08/15/2024  8:37 AM      Failed - Medication not assigned to a protocol, review manually.      Passed - Valid encounter within last 12 months    Recent Outpatient Visits           3 months ago Type 2 diabetes mellitus with morbid obesity (HCC)   [place] [facility] [place], [place] T, NP   4 months ago Centrilobular emphysema (HCC)   [place] [facility] [place], Melanie T, NP   5 months ago COPD exacerbation [facility])   [place] [facility] Herold Hadassah SQUIBB, MD   6 months ago Centrilobular emphysema [facility])   [place] [facility], Melanie T, NP   6 months ago Centrilobular emphysema [facility])   [place] [facility] Valerio Melanie DASEN, NP       Future Appointments             In 1 month Dunn, Bernardino HERO, PA-C [place] [facility] [place]   In 2 months Jackquline Sawyer, MD [facility]

## 2024-08-16 NOTE — Telephone Encounter (Signed)
Refill has been completed.

## 2024-08-17 ENCOUNTER — Telehealth: Payer: Self-pay

## 2024-08-17 ENCOUNTER — Ambulatory Visit (INDEPENDENT_AMBULATORY_CARE_PROVIDER_SITE_OTHER): Admitting: Nurse Practitioner

## 2024-08-17 ENCOUNTER — Encounter: Payer: Self-pay | Admitting: Nurse Practitioner

## 2024-08-17 DIAGNOSIS — E1142 Type 2 diabetes mellitus with diabetic polyneuropathy: Secondary | ICD-10-CM | POA: Diagnosis not present

## 2024-08-17 DIAGNOSIS — K219 Gastro-esophageal reflux disease without esophagitis: Secondary | ICD-10-CM

## 2024-08-17 DIAGNOSIS — E1169 Type 2 diabetes mellitus with other specified complication: Secondary | ICD-10-CM

## 2024-08-17 DIAGNOSIS — J432 Centrilobular emphysema: Secondary | ICD-10-CM | POA: Diagnosis not present

## 2024-08-17 DIAGNOSIS — E1351 Other specified diabetes mellitus with diabetic peripheral angiopathy without gangrene: Secondary | ICD-10-CM

## 2024-08-17 DIAGNOSIS — I5032 Chronic diastolic (congestive) heart failure: Secondary | ICD-10-CM

## 2024-08-17 DIAGNOSIS — I7123 Aneurysm of the descending thoracic aorta, without rupture: Secondary | ICD-10-CM

## 2024-08-17 DIAGNOSIS — Z0000 Encounter for general adult medical examination without abnormal findings: Secondary | ICD-10-CM

## 2024-08-17 DIAGNOSIS — E66811 Obesity, class 1: Secondary | ICD-10-CM

## 2024-08-17 DIAGNOSIS — E785 Hyperlipidemia, unspecified: Secondary | ICD-10-CM

## 2024-08-17 DIAGNOSIS — F419 Anxiety disorder, unspecified: Secondary | ICD-10-CM

## 2024-08-17 DIAGNOSIS — G720 Drug-induced myopathy: Secondary | ICD-10-CM

## 2024-08-17 DIAGNOSIS — I890 Lymphedema, not elsewhere classified: Secondary | ICD-10-CM

## 2024-08-17 LAB — BAYER DCA HB A1C WAIVED: HB A1C (BAYER DCA - WAIVED): 6 % — ABNORMAL HIGH (ref 4.8–5.6)

## 2024-08-17 MED ORDER — PANTOPRAZOLE SODIUM 40 MG PO TBEC: DELAYED_RELEASE_TABLET | ORAL | 4 refills | Status: AC

## 2024-08-17 MED ORDER — ERYTHROMYCIN 5 MG/GM OP OINT: 1.0000 | TOPICAL_OINTMENT | Freq: Every day | OPHTHALMIC | 0 refills | Status: DC

## 2024-08-17 NOTE — Assessment & Plan Note (Signed)
 Chronic, ongoing with lymphedema and PVD.  Continue to collaborate with vascular and current medication regimen.  A1c remains stable at 6.6% last visit, will recheck today.  Continue diet focus for diabetes control.

## 2024-08-17 NOTE — Assessment & Plan Note (Signed)
Chronic, stable, only uses PRN.  Continue daily Citalopram and monitor.  Adjust dose or medication as needed, do not increase Celexa to 40 MG due to patient age >57.  Denies SI/HI. ?

## 2024-08-17 NOTE — Assessment & Plan Note (Signed)
Chronic, ongoing.  Continue Protonix as ordered and adjust as needed.  Mag level today.

## 2024-08-17 NOTE — Assessment & Plan Note (Signed)
 BMI 28.80 with T2DM, HLD, COPD.  Recommended eating smaller high protein, low fat meals more frequently and exercising 30 mins a day 5 times a week with a goal of 10-15lb weight loss in the next 3 months. Patient voiced their understanding and motivation to adhere to these recommendations.

## 2024-08-17 NOTE — Addendum Note (Signed)
 Addended by: Jarion Hawthorne T on: 08/17/2024 02:44 PM   Modules accepted: Orders

## 2024-08-17 NOTE — Assessment & Plan Note (Signed)
Chronic, ongoing. Continue Zetia at this time as is tolerating + Rosuvastatin at low dose. Have tried multiple statins with ongoing myalgias with use.  Praluent and Zetia also caused discomfort.  Check lipid panel and CMP today with goal LDL<70.  CCM collaboration continues.

## 2024-08-17 NOTE — Progress Notes (Addendum)
 BP 109/62   Pulse 76   Temp 97.9 F (36.6 C) (Oral)   Ht 5' 9 (1.753 m)   Wt 195 lb (88.5 kg)   LMP  (LMP Unknown)   SpO2 98%   BMI 28.80 kg/m    Subjective:    Patient ID: Sharon [name], female    DOB: 1946/07/07, 78 y.o.   MRN: [id number]  HPI: Sharon [name] is a 78 y.o. female presenting on 08/17/2024 for comprehensive medical exam. Current medical complaints include:none  She currently lives with: husband Menopausal Symptoms: no  DIABETES August A1c 6.6%.  No current medications, diet focused.  Tried Metformin  in past but this caused GI upset. Hypoglycemic episodes:no Polydipsia/polyuria: no Visual disturbance: no Chest pain: no Paresthesias: no Glucose Monitoring: yes  Accucheck frequency: not often  Fasting glucose:   Post prandial:   Evening:  Before meals: Taking Insulin ?: no  Long acting insulin :  Short acting insulin : Blood Pressure Monitoring: rarely Retinal Examination: Up to Date Foot Exam: Up to Date Diabetic Education: Not Completed Pneumovax: Up to Date Influenza: Up to Date Aspirin : yes   COPD Continues Breztri  daily and Albuterol  PRN.  In past took Stiolto and Symbicort  without benefit. Saw pulmonary on 05/17/24 last -- goes for breathing tests next week with Dr. Tamea. Continues on Protonix  for GERD. SABRA   Last lung screening was 08/03/23 noting mild centrilobular emphysema and aortic atherosclerosis.  She has aged out of this program.  COPD status: stable Satisfied with current treatment?: yes Oxygen use: no Dyspnea frequency: none Cough frequency: occasional Rescue inhaler frequency: every morning Limitation of activity: no Productive cough: none Last Spirometry: with pulmonary Pneumovax: Up to Date Influenza: Up to Date   HYPERLIPIDEMIA Continues Rosuvastatin  5 MG and Zetia  with benefit. Tried Praluent  in past, which caused worsening joint pain, this was similar to multiple statins tried. Abdominal aortic ectatic that is 4 cm, which  is being monitored by vascular, imaging on 08/03/23 showed stability of this.  Saw vascular last 03/31/24 and cardiology 04/22/24.  Continues on Plavix  and ASA daily due to CVA history. History of CVA in 2019 and then 07/02/22.  Saw neurology last on 11/17/23 for chronic severe sensory polyneuropathy and her CVA history.  Hyperlipidemia status: good compliance Satisfied with current treatment?  yes Side effects:  no Medication compliance: good compliance Past cholesterol meds: all statins and Praluent  Supplements: none Aspirin :  yes The ASCVD Risk score (Arnett DK, et al., 2019) failed to calculate for the following reasons:   Risk score cannot be calculated because patient has a medical history suggesting prior/existing ASCVD Chest pain:  no Coronary artery disease:  no Family history CAD:  yes Family history early CAD:  no   LYMPHEDEMA WITH CHRONIC VENOUS INSUFFICIENCY: Takes Tylenol  on occasion for pain.  Uses lymphedema pumps at home. Dermatology last 07/19/24.  Previous treatments for cellulitis, last treated several months back. Duration: chronic Location: BLE  DEPRESSION Takes Celexa  20 MG as needed for mood, uses for when driving down highway -- reports this helps on a PRN basis.  Mood status: stable Satisfied with current treatment?: yes Symptom severity: mild  Duration of current treatment : chronic Side effects: no Medication compliance: good compliance Depressed mood: no Anxious mood: no Anhedonia: no Significant weight loss or gain: no Insomnia: no Fatigue: yes Feelings of worthlessness or guilt: no Impaired concentration/indecisiveness: no Suicidal ideations: no Hopelessness: no Crying spells: no    08/17/2024    2:20 PM 02/12/2024  1:21 PM 11/13/2023    3:03 PM 07/22/2023    1:32 PM 04/21/2023    4:37 PM  Depression screen PHQ 2/9  Decreased Interest 0 0 0 0 1  Down, Depressed, Hopeless 1 0 1 0 1  PHQ - 2 Score 1 0 1 0 2  Altered sleeping 0 0 0 0 0  Tired,  decreased energy 0 1 1 1  0  Change in appetite 0 0 0 0 0  Feeling bad or failure about yourself  0 0 1 0 0  Trouble concentrating 0 0 0 0 0  Moving slowly or fidgety/restless 0 0 0 0 0  Suicidal thoughts 0  0 0 0  PHQ-9 Score 1 1 3 1 2   Difficult doing work/chores Not difficult at all   Not difficult at all       08/17/2024    2:21 PM 02/12/2024    1:21 PM 11/13/2023    3:05 PM 07/22/2023    1:32 PM  GAD 7 : Generalized Anxiety Score  Nervous, Anxious, on Edge 1 0 1 1  Control/stop worrying 0 0 0 0  Worry too much - different things 1 0 1 0  Trouble relaxing 0 0 0 0  Restless 0 0 0 0  Easily annoyed or irritable 1 0 1 0  Afraid - awful might happen 0 0 2 0  Total GAD 7 Score 3 0 5 1  Anxiety Difficulty Not difficult at all  Somewhat difficult Not difficult at all      07/03/2022    7:40 PM 10/07/2022    1:11 PM 10/15/2022   11:01 AM 07/22/2023    1:31 PM 02/12/2024    1:21 PM  Fall Risk  Falls in the past year?  0  0 0  Was there an injury with Fall?  0  0 0  Fall Risk Category Calculator  0  0 0  Fall Risk Category (Retired)  Low      (RETIRED) Patient Fall Risk Level Low fall risk   Low fall risk     Patient at Risk for Falls Due to  No Fall Risks  No Fall Risks No Fall Risks  Fall risk Follow up  Falls evaluation completed   Falls evaluation completed Falls evaluation completed     Data saved with a previous flowsheet row definition    Functional Status Survey: Is the patient deaf or have difficulty hearing?: No Does the patient have difficulty seeing, even when wearing glasses/contacts?: No Does the patient have difficulty concentrating, remembering, or making decisions?: No Does the patient have difficulty walking or climbing stairs?: No Does the patient have difficulty dressing or bathing?: No Does the patient have difficulty doing errands alone such as visiting a doctor's office or shopping?: No   Past Medical History:  Past Medical History:  Diagnosis Date    Aneurysm of descending thoracic aorta    Anxiety    Arthritis    hands, upper back   Asthma    Cervical cancer (HCC)    in situ 1970's   CHF (congestive heart failure) (HCC)    Claustrophobia    COPD (chronic obstructive pulmonary disease) (HCC)    Coronary artery disease    COVID    GERD (gastroesophageal reflux disease)    History of cervical cancer    HLD (hyperlipidemia)    Lymphedema    Menopausal disorder    Neuropathy    Osteoporosis    Pneumonia 1960  Pre-diabetes    PVD (peripheral vascular disease)    Spasm of abdominal muscles of right side    intermittent   Stroke (HCC) 2019   TMJ (dislocation of temporomandibular joint)    Wears dentures    partial lower    Surgical History:  Past Surgical History:  Procedure Laterality Date   bladder botox  2005   BLADDER SUSPENSION  2004   CATARACT EXTRACTION W/PHACO Right 03/09/2018   Procedure: CATARACT EXTRACTION PHACO AND INTRAOCULAR LENS PLACEMENT (IOC) right;  Surgeon: Myrna Adine Anes, MD;  Location: [facility];  Service: Ophthalmology;  Laterality: Right;  CALL CELL 1ST   CATARACT EXTRACTION W/PHACO Left 04/19/2018   Procedure: CATARACT EXTRACTION PHACO AND INTRAOCULAR LENS PLACEMENT (IOC)  LEFT;  Surgeon: Myrna Adine Anes, MD;  Location: [facility];  Service: Ophthalmology;  Laterality: Left;   CHOLECYSTECTOMY, LAPAROSCOPIC  02/18/2023   COLONOSCOPY WITH PROPOFOL  N/A 12/13/2015   Procedure: COLONOSCOPY WITH PROPOFOL ;  Surgeon: Rogelia Copping, MD;  Location: [facility];  Service: Endoscopy;  Laterality: N/A;   COLONOSCOPY WITH PROPOFOL  N/A 03/14/2021   Procedure: COLONOSCOPY WITH PROPOFOL ;  Surgeon: Copping Rogelia, MD;  Location: [facility];  Service: Endoscopy;  Laterality: N/A;  diabetic   LOOP RECORDER INSERTION N/A 01/07/2018   Procedure: LOOP RECORDER INSERTION;  Surgeon: Fernande Elspeth BROCKS, MD;  Location: [name] IN[facility];  Service: Cardiovascular;  Laterality: N/A;   LOOP  RECORDER REMOVAL     POLYPECTOMY N/A 12/13/2015   Procedure: POLYPECTOMY;  Surgeon: Rogelia Copping, MD;  Location: [facility];  Service: Endoscopy;  Laterality: N/A;  SIGMOID COLON POLYPS X  5   POLYPECTOMY N/A 03/14/2021   Procedure: POLYPECTOMY;  Surgeon: Copping Rogelia, MD;  Location: [facility];  Service: Endoscopy;  Laterality: N/A;   SHOULDER ARTHROSCOPY W/ ROTATOR CUFF REPAIR Right 1998   TEE WITHOUT CARDIOVERSION N/A 01/06/2018   Procedure: TRANSESOPHAGEAL ECHOCARDIOGRAM (TEE);  Surgeon: Gollan, Timothy J, MD;  Location: [facility] OR[facility];  Service: Cardiovascular;  Laterality: N/A;   TONSILLECTOMY AND ADENOIDECTOMY     TOOTH EXTRACTION     July 2023   VAGINAL HYSTERECTOMY  1970's    Medications:  Current Outpatient Medications on File Prior to Visit  Medication Sig   acetaminophen  (TYLENOL ) 500 MG tablet Take 500 mg by mouth every 6 (six) hours as needed.   albuterol  (VENTOLIN  HFA) 108 (90 Base) MCG/ACT inhaler INHALE 2 PUFFS BY MOUTH EVERY 6 HOURS AS NEEDED FOR WHEEZING OR SHORTNESS OF BREATH   aspirin  EC 81 MG tablet Take 81 mg by mouth 3 (three) times a week. Swallow whole. M-W-Fr   Blood Glucose Monitoring Suppl (ONETOUCH VERIO) w/Device KIT Utilize to check blood sugar twice a day, fasting in morning with goal < 130 and then 2 hours after a meal with goal <180.  Document and bring to visits.   BREZTRI  AEROSPHERE 160-9-4.8 MCG/ACT AERO inhaler INHALE 2 PUFFS BY MOUTH TWICE DAILY AS DIRECTED   cetirizine  (ZYRTEC ) 10 MG tablet Take 1 tablet (10 mg total) by mouth daily.   Cholecalciferol  (VITAMIN D3) 50 MCG (2000 UT) CAPS Take 1 capsule by mouth daily.   citalopram  (CELEXA ) 20 MG tablet Take 1 tablet (20 mg total) by mouth as needed.   clopidogrel  (PLAVIX ) 75 MG tablet Take 1 tablet (75 mg total) by mouth daily.   Crisaborole  (EUCRISA ) 2 % OINT Apply 1 application  topically daily. Use for itching and inflammation at left leg   Cyanocobalamin  1000 MCG/ML KIT  Inject 1mL into  the muscle once a month (Patient taking differently: Inject 1,000 mcg into the muscle every 30 (thirty) days. Inject 1mL into the muscle once a month)   desoximetasone  (TOPICORT ) 0.05 % cream Apply qd/bid daily to affected areas of lower legs for  2 weeks. Can use for a max of 4 weeks if rash has not improved at 2 weeks Avoid applying to face, groin, and axilla. Use as directed.   ezetimibe  (ZETIA ) 10 MG tablet Take 1 tablet (10 mg total) by mouth daily.   gabapentin (NEURONTIN) 100 MG tablet Take 100 mg by mouth 2 (two) times daily.   glucose blood (ONETOUCH VERIO) test strip UTILIIZE TO CHECK BLOOD SUGAR TWICE DAILY FASTING IN MORNING WITH GOAL<130 AND THAN 2 HOURS AFTER A MEAL WITH GGOAL<180   Glycerin-Polysorbate 80 (REFRESH DRY EYE THERAPY OP) Apply to eye.   Lancets (ONETOUCH ULTRASOFT) lancets Utilize to check blood sugar twice a day, fasting in morning with goal < 130 and then 2 hours after a meal with goal <180.  Document and bring to visits.   loratadine  (CLARITIN ) 10 MG tablet Take 1 tablet (10 mg total) by mouth daily.   meclizine  (ANTIVERT ) 12.5 MG tablet Take 12.5 mg by mouth 3 (three) times daily. (Patient taking differently: Take 12.5 mg by mouth 3 (three) times daily. PATIENT TAKING AS NEEDED)   mometasone  (ELOCON ) 0.1 % cream Apply 1 Application topically 2 (two) times daily. As needed for flares up to two weeks only   rosuvastatin  (CRESTOR ) 5 MG tablet TAKE 1 TABLET(5 MG) BY MOUTH DAILY   Ruxolitinib Phosphate  (OPZELURA ) 1.5 % CREA Apply topically to legs twice daily for dermatitis   Syringe/Needle, Disp, 22G X 1-1/2 1 ML MISC Use to inject b12 monthly   furosemide  (LASIX ) 20 MG tablet Take 0.5 tablets (10 mg total) by mouth daily as needed. (Patient not taking: Reported on 08/17/2024)   No current facility-administered medications on file prior to visit.    Allergies:  Allergies  Allergen Reactions   Statins Other (See Comments)    Myalgias and joint pain on atorvastatin   40mg  and rosuvastatin  20mg    Dermacerin [Aquaphilic]    Elidel  [Pimecrolimus ]     Patient reports allergy   Nsaids    Pineapple Itching    throat   Praluent  [Alirocumab ]     Flu like symptoms, leg swelling, rash   Quinolones     FLUOROQUINOLONES - Pt reports she was told to NEVER take these.   Baclofen  Swelling   Bactrim  [Sulfamethoxazole -Trimethoprim ] Nausea And Vomiting   Ibuprofen Itching and Rash    Mouth swelling and itching   Lanolin Rash    Allergy to triamcinolone     Latex Rash    Some bandaids, some gloves; BLOOD TEST NEGATIVE   Librium [Chlordiazepoxide] Itching    Dizziness    Naprosyn [Naproxen] Rash    Mouth swelling and swelling   Other Rash    Brazil nuts - mouth swelling    Social History:  Social History   Socioeconomic History   Marital status: Married    Spouse name: Cortni Tays   Number of children: 1   Years of education: Not on file   Highest education level: Some college, no degree  Occupational History   Occupation: retired  Tobacco Use   Smoking status: Former    Current packs/day: 0.00    Average packs/day: 0.5 packs/day for 56.0 years (28.0 ttl pk-yrs)    Types: Cigarettes    Start  date: 07/14/1960    Quit date: 07/14/2016    Years since quitting: 8.0   Smokeless tobacco: Never  Vaping Use   Vaping status: Never Used  Substance and Sexual Activity   Alcohol use: Yes    Comment: occassional Margarita   Drug use: No   Sexual activity: Not Currently    Birth control/protection: Post-menopausal  Other Topics Concern   Not on file  Social History Narrative   Lives with husband, manages farm   Social Drivers of Health   Financial Resource Strain: Low Risk  (07/22/2023)   Overall Financial Resource Strain (CARDIA)    Difficulty of Paying Living Expenses: Not hard at all  Food Insecurity: No Food Insecurity (07/22/2023)   Hunger Vital Sign    Worried About Running Out of Food in the Last Year: Never true    Ran Out of Food in the  Last Year: Never true  Transportation Needs: No Transportation Needs (07/22/2023)   PRAPARE - Administrator, Civil Service (Medical): No    Lack of Transportation (Non-Medical): No  Physical Activity: Insufficiently Active (07/22/2023)   Exercise Vital Sign    Days of Exercise per Week: 3 days    Minutes of Exercise per Session: 30 min  Stress: No Stress Concern Present (07/22/2023)   [employer] of Occupational Health - Occupational Stress Questionnaire    Feeling of Stress : Only a little  Social Connections: Moderately Isolated (07/22/2023)   Social Connection and Isolation Panel    Frequency of Communication with Friends and Family: Twice a week    Frequency of Social Gatherings with Friends and Family: Twice a week    Attends Religious Services: Never    Database Administrator or Organizations: No    Attends Banker Meetings: Never    Marital Status: Married  Catering Manager Violence: Not At Risk (07/22/2023)   Humiliation, Afraid, Rape, and Kick questionnaire    Fear of Current or Ex-Partner: No    Emotionally Abused: No    Physically Abused: No    Sexually Abused: No   Social History   Tobacco Use  Smoking Status Former   Current packs/day: 0.00   Average packs/day: 0.5 packs/day for 56.0 years (28.0 ttl pk-yrs)   Types: Cigarettes   Start date: 07/14/1960   Quit date: 07/14/2016   Years since quitting: 8.0  Smokeless Tobacco Never   Social History   Substance and Sexual Activity  Alcohol Use Yes   Comment: Ambulance Person    Family History:  Family History  Problem Relation Age of Onset   Diabetes Mother    Heart disease Mother    Stroke Mother    Stroke Maternal Grandmother    Hyperlipidemia Neg Hx     Past medical history, surgical history, medications, allergies, family history and social history reviewed with patient today and changes made to appropriate areas of the chart.   ROS All other ROS negative except what  is listed above and in the HPI.      Objective:    BP 109/62   Pulse 76   Temp 97.9 F (36.6 C) (Oral)   Ht 5' 9 (1.753 m)   Wt 195 lb (88.5 kg)   LMP  (LMP Unknown)   SpO2 98%   BMI 28.80 kg/m   Wt Readings from Last 3 Encounters:  08/17/24 195 lb (88.5 kg)  05/17/24 201 lb (91.2 kg)  05/16/24 202 lb 6.4 oz (91.8 kg)  Physical Exam Vitals and nursing note reviewed. Exam conducted with a chaperone present.  Constitutional:      General: She is awake. She is not in acute distress.    Appearance: She is well-developed and well-groomed. She is not ill-appearing or toxic-appearing.  HENT:     Head: Normocephalic and atraumatic.     Right Ear: Hearing, tympanic membrane, ear canal and external ear normal. No drainage.     Left Ear: Hearing, tympanic membrane, ear canal and external ear normal. No drainage.     Nose: Nose normal.     Right Sinus: No maxillary sinus tenderness or frontal sinus tenderness.     Left Sinus: No maxillary sinus tenderness or frontal sinus tenderness.     Mouth/Throat:     Mouth: Mucous membranes are moist.     Pharynx: Oropharynx is clear. Uvula midline. No pharyngeal swelling, oropharyngeal exudate or posterior oropharyngeal erythema.  Eyes:     General: Lids are normal.        Right eye: No discharge.        Left eye: Hordeolum present.No discharge.     Extraocular Movements: Extraocular movements intact.     Conjunctiva/sclera: Conjunctivae normal.     Pupils: Pupils are equal, round, and reactive to light.     Visual Fields: Right eye visual fields normal and left eye visual fields normal.  Neck:     Thyroid : No thyromegaly.     Vascular: No carotid bruit.     Trachea: Trachea normal.  Cardiovascular:     Rate and Rhythm: Normal rate and regular rhythm.     Heart sounds: Normal heart sounds. No murmur heard.    No gallop.  Pulmonary:     Effort: Pulmonary effort is normal. No accessory muscle usage or respiratory distress.     Breath  sounds: Normal breath sounds.  Chest:  Breasts:    Right: Normal.     Left: Normal.  Abdominal:     General: Bowel sounds are normal.     Palpations: Abdomen is soft. There is no hepatomegaly or splenomegaly.     Tenderness: There is no abdominal tenderness.  Musculoskeletal:        General: Normal range of motion.     Cervical back: Normal range of motion and neck supple.     Right lower leg: No edema.     Left lower leg: No edema.  Lymphadenopathy:     Head:     Right side of head: No submental, submandibular, tonsillar, preauricular or posterior auricular adenopathy.     Left side of head: No submental, submandibular, tonsillar, preauricular or posterior auricular adenopathy.     Cervical: No cervical adenopathy.     Upper Body:     Right upper body: No supraclavicular, axillary or pectoral adenopathy.     Left upper body: No supraclavicular, axillary or pectoral adenopathy.  Skin:    General: Skin is warm and dry.     Capillary Refill: Capillary refill takes less than 2 seconds.     Findings: No rash.  Neurological:     Mental Status: She is alert and oriented to person, place, and time.     Gait: Gait is intact.     Deep Tendon Reflexes: Reflexes are normal and symmetric.     Reflex Scores:      Brachioradialis reflexes are 2+ on the right side and 2+ on the left side.      Patellar reflexes are 2+ on the  right side and 2+ on the left side. Psychiatric:        Attention and Perception: Attention normal.        Mood and Affect: Mood normal.        Speech: Speech normal.        Behavior: Behavior normal. Behavior is cooperative.        Thought Content: Thought content normal.        Judgment: Judgment normal.       07/22/2023    2:00 PM 06/03/2022    8:48 AM 04/19/2021    1:20 PM 04/09/2020    8:44 AM 01/14/2018    9:53 AM  6CIT Screen  What Year? 0 points 0 points 0 points 0 points 0 points  What month? 0 points 0 points 0 points 0 points 0 points  What time? 0 points  0 points 0 points 0 points 0 points  Count back from 20 0 points 0 points 0 points 0 points 0 points  Months in reverse 0 points 0 points 0 points 0 points 0 points  Repeat phrase 0 points 0 points 0 points 0 points 0 points  Total Score 0 points 0 points 0 points 0 points 0 points     Results for orders placed or performed in visit on 07/19/24  Surgical pathology   Collection Time: 07/19/24 12:00 AM  Result Value Ref Range   SURGICAL PATHOLOGY      SURGICAL PATHOLOGY [facility] [address] [place], [place] [address] Telephone [phone] or [phone] Fax [phone]  REPORT OF DERMATOPATHOLOGY   Accession #: [id number] Patient Name: NARCISSA, MELDER Visit # : [id number]  MRN: [id number] Cytotechnologist: Ephriam Rolla Edelman, Dermatopathologist, Electronic Signature DOB/Age Dec 26, 1945 (Age: 55) Gender: F Collected Date: 07/19/2024 Received Date: 07/19/2024  FINAL DIAGNOSIS       1. Skin, L 4th dorsal toe :       PROBABLE OLD THROMBOSED ANGIOMA/INFARCTED BLISTER, SEE DESCRIPTION       DATE SIGNED OUT: 07/20/2024 ELECTRONIC SIGNATURE : Depcik-Smith Md, Natalie, Dermatopathologist, Electronic Signature  MICROSCOPIC DESCRIPTION 1. The epidermis is focally denuded. Subjacent to this, in the dermis is hemorrhage which is beginning to organize in a Masson's-like configuration.  Chronic inflammation and acute inflammation is also seen.  I do not see a distinct fibr ous capsule nor well defined mucin and favor this is an old hemosiderotic/blood blister or organizing angioma.  (NDS:kh 07/20/24)  CASE COMMENTS STAINS USED IN DIAGNOSIS: H&E    CLINICAL HISTORY  SPECIMEN(S) OBTAINED 1. Skin, L 4th Dorsal Toe  SPECIMEN COMMENTS: 1. 5 mm firm red papule SPECIMEN CLINICAL INFORMATION: 1. Neoplasm of uncertain behavior of skin, digital mucous cyst vs pyogenic granuloma vs blood blister R/O BCC    Gross Description 1. Formalin  fixed specimen received:  5 X 5 X 1 MM, TOTO (2 P) (1 B) ( mm )        Report signed out from the following location(s) [place] [address] [place], [place] [address] CLIA #: [id number]  [facility] [address] [place], [place] [address] CLIA #: [id number]    *Note: Due to a large number of results and/or encounters for the requested time period, some results have not been displayed. A complete set of results can be found in Results Review.      Assessment & Plan:   Problem List Items Addressed This Visit  Cardiovascular and Mediastinum   Peripheral vascular disease due to secondary diabetes (HCC)   Chronic, ongoing with lymphedema and PVD.  Continue to collaborate with vascular and current medication regimen.  A1c remains stable at 6.6% last visit, will recheck today.  Continue diet focus for diabetes control.      Chronic diastolic CHF (congestive heart failure) (HCC)   Chronic, stable.  EF 55-60% on 07/03/22. Euvolemic. Continue collaboration with cardiology.  Recommend: - Reminded to call for an overnight weight gain of >2 pounds or a weekly weight gain of >5 pounds - not adding salt to food and read food labels. Reviewed the importance of keeping daily sodium intake to 2000mg  daily. - Avoid Ibuprofen products      Aneurysm of descending thoracic aorta   Followed by vascular, last CT scan showed no significant increase in size. Last imaging in October 2024. Continue to collaborate with vascular.        Respiratory   Centrilobular emphysema (HCC)   Chronic, ongoing. Continue Breztri , for triple therapy, is tolerating and Albuterol  as needed. Continue to collaborate with pulmonary. Will continue to collaborate with [facility] pharmacist for assistance on medications. We discussed adding Singulair  due to underlying allergies, educated on this and BLACK BOX warning.  She prefers not to start.      Relevant Orders   CBC with  Differential/Platelet     Digestive   GERD (gastroesophageal reflux disease)   Chronic, ongoing.  Continue Protonix  as ordered and adjust as needed.  Mag level today.      Relevant Medications   pantoprazole  (PROTONIX ) 40 MG tablet   Other Relevant Orders   Magnesium     Endocrine   Type 2 diabetes mellitus with morbid obesity (HCC) - Primary   Chronic, stable, remains off medication at this time. Diet focused. A1c 6.6% last visit, will recheck today, and urine ALB 12 November 2023.  At this time continue diet focus, but with history of CVA goal is to maintain A1c <6.5%.  Monitor sugars at home a few days a week and document for provider. - No ACE or ARB on board due to lower BP readings at baseline.  Statin on board. - Vaccinations up to date. - Foot and eye exam up to date.      Relevant Orders   Bayer DCA Hb A1c Waived   Peripheral sensory neuropathy due to type 2 diabetes mellitus (HCC)   Chronic, ongoing.  A1c 6.6% last visit, recheck today, and urine ALB 12 November 2023. Currently no medications for this.  Continue to collaborate with neurology.      Relevant Orders   Bayer DCA Hb A1c Waived   Hyperlipidemia associated with type 2 diabetes mellitus (HCC)   Chronic, ongoing. Continue Zetia  at this time as is tolerating + Rosuvastatin  at low dose. Have tried multiple statins with ongoing myalgias with use.  Praluent  and Zetia  also caused discomfort.  Check lipid panel and CMP today with goal LDL<70.  CCM collaboration continues.        Relevant Orders   Bayer DCA Hb A1c Waived   Comprehensive metabolic panel with GFR   Lipid Panel w/o Chol/HDL Ratio     Musculoskeletal and Integument   Drug-induced myopathy   Refer to HLD plan of care.        Other   Obesity   BMI 28.80 with T2DM, HLD, COPD.  Recommended eating smaller high protein, low fat meals more frequently and exercising 30 mins a day 5  times a week with a goal of 10-15lb weight loss in the next 3 months. Patient  voiced their understanding and motivation to adhere to these recommendations.       Lymphedema   Chronic, ongoing.  Continue use of compression hose daily and pumps.  Continue collaboration with vascular and dermatology team.  Appreciate their input.        Anxiety   Chronic, stable, only uses PRN.  Continue daily Citalopram  and monitor.  Adjust dose or medication as needed, do not increase Celexa  to 40 MG due to patient age >32.  Denies SI/HI.      Other Visit Diagnoses       Encounter for annual physical exam       Annual physical today with labs and health maintenance reviewed, discussed with patient.        Follow up plan: Return in about 3 months (around 11/17/2024) for T2DM, HTN/HLD, ANXIETY, COPD.   LABORATORY TESTING:  - Pap smear: not applicable  IMMUNIZATIONS:   - Tdap: Tetanus vaccination status reviewed: last tetanus booster within 10 years. - Influenza: Up to date - Pneumovax: Up to date - Prevnar: Up to date - COVID: Up to date - HPV: Not applicable - Shingrix vaccine: Refused  SCREENING: -Mammogram: Not applicable  - Colonoscopy: Up to date  - Bone Density: Ordered today  -Hearing Test: Not applicable  -Spirometry: Not applicable   PATIENT COUNSELING:   Advised to take 1 mg of folate supplement per day if capable of pregnancy.   Sexuality: Discussed sexually transmitted diseases, partner selection, use of condoms, avoidance of unintended pregnancy  and contraceptive alternatives.   Advised to avoid cigarette smoking.  I discussed with the patient that most people either abstain from alcohol or drink within safe limits (<=14/week and <=4 drinks/occasion for males, <=7/weeks and <= 3 drinks/occasion for females) and that the risk for alcohol disorders and other health effects rises proportionally with the number of drinks per week and how often a drinker exceeds daily limits.  Discussed cessation/primary prevention of drug use and availability of  treatment for abuse.   Diet: Encouraged to adjust caloric intake to maintain  or achieve ideal body weight, to reduce intake of dietary saturated fat and total fat, to limit sodium intake by avoiding high sodium foods and not adding table salt, and to maintain adequate dietary potassium and calcium  preferably from fresh fruits, vegetables, and low-fat dairy products.    Stressed the importance of regular exercise  Injury prevention: Discussed safety belts, safety helmets, smoke detector, smoking near bedding or upholstery.   Dental health: Discussed importance of regular tooth brushing, flossing, and dental visits.    NEXT PREVENTATIVE PHYSICAL DUE IN 1 YEAR. Return in about 3 months (around 11/17/2024) for T2DM, HTN/HLD, ANXIETY, COPD.

## 2024-08-17 NOTE — Assessment & Plan Note (Signed)
Refer to [facility] plan of care. 

## 2024-08-17 NOTE — Assessment & Plan Note (Signed)
Chronic, ongoing.  Continue use of compression hose daily and pumps.  Continue collaboration with vascular and dermatology team.  Appreciate their input.

## 2024-08-17 NOTE — Assessment & Plan Note (Signed)
 Chronic, stable, remains off medication at this time. Diet focused. A1c 6.6% last visit, will recheck today, and urine ALB 12 November 2023.  At this time continue diet focus, but with history of CVA goal is to maintain A1c <6.5%.  Monitor sugars at home a few days a week and document for provider. - No ACE or ARB on board due to lower BP readings at baseline.  Statin on board. - Vaccinations up to date. - Foot and eye exam up to date.

## 2024-08-17 NOTE — Telephone Encounter (Signed)
 Copied from CRM [id number]. Topic: Clinical - Medication Question >> Aug 17, 2024  4:01 PM Geneva B wrote: Reason for CRM: [facility] have question about rxerythromycin ophthalmic ointment  please call [phone]

## 2024-08-17 NOTE — Assessment & Plan Note (Signed)
Chronic, stable.  EF 55-60% on 07/03/22. Euvolemic.  Continue collaboration with cardiology.  Recommend: - Reminded to call for an overnight weight gain of >2 pounds or a weekly weight gain of >5 pounds - not adding salt to food and read food labels. Reviewed the importance of keeping daily sodium intake to 2000mg  daily. - Avoid Ibuprofen products

## 2024-08-17 NOTE — Assessment & Plan Note (Signed)
 Followed by vascular, last CT scan showed no significant increase in size. Last imaging in October 2024. Continue to collaborate with vascular.

## 2024-08-17 NOTE — Assessment & Plan Note (Signed)
 Chronic, ongoing. Continue Breztri , for triple therapy, is tolerating and Albuterol  as needed. Continue to collaborate with pulmonary. Will continue to collaborate with [facility] pharmacist for assistance on medications. We discussed adding Singulair  due to underlying allergies, educated on this and BLACK BOX warning.  She prefers not to start.

## 2024-08-17 NOTE — Assessment & Plan Note (Signed)
 Chronic, ongoing.  A1c 6.6% last visit, recheck today, and urine ALB 12 November 2023. Currently no medications for this.  Continue to collaborate with neurology.

## 2024-08-18 ENCOUNTER — Ambulatory Visit: Payer: Self-pay | Admitting: Nurse Practitioner

## 2024-08-18 LAB — MAGNESIUM: Magnesium: 2.1 mg/dL (ref 1.6–2.3)

## 2024-08-18 LAB — CBC WITH DIFFERENTIAL/PLATELET
Basophils Absolute: 0 x10E3/uL (ref 0.0–0.2)
Basos: 0 %
EOS (ABSOLUTE): 0.1 x10E3/uL (ref 0.0–0.4)
Eos: 1 %
Hematocrit: 43.1 % (ref 34.0–46.6)
Hemoglobin: 14 g/dL (ref 11.1–15.9)
Immature Grans (Abs): 0 x10E3/uL (ref 0.0–0.1)
Immature Granulocytes: 0 %
Lymphocytes Absolute: 1.5 x10E3/uL (ref 0.7–3.1)
Lymphs: 19 %
MCH: 30.3 pg (ref 26.6–33.0)
MCHC: 32.5 g/dL (ref 31.5–35.7)
MCV: 93 fL (ref 79–97)
Monocytes Absolute: 0.6 x10E3/uL (ref 0.1–0.9)
Monocytes: 8 %
Neutrophils Absolute: 5.5 x10E3/uL (ref 1.4–7.0)
Neutrophils: 72 %
Platelets: 216 x10E3/uL (ref 150–450)
RBC: 4.62 x10E6/uL (ref 3.77–5.28)
RDW: 12.7 % (ref 11.7–15.4)
WBC: 7.6 x10E3/uL (ref 3.4–10.8)

## 2024-08-18 LAB — COMPREHENSIVE METABOLIC PANEL WITH GFR
ALT: 13 IU/L (ref 0–32)
AST: 18 IU/L (ref 0–40)
Albumin: 4.4 g/dL (ref 3.8–4.8)
Alkaline Phosphatase: 88 IU/L (ref 49–135)
BUN/Creatinine Ratio: 16 (ref 12–28)
BUN: 17 mg/dL (ref 8–27)
Bilirubin Total: 0.2 mg/dL (ref 0.0–1.2)
CO2: 22 mmol/L (ref 20–29)
Calcium: 9.5 mg/dL (ref 8.7–10.3)
Chloride: 104 mmol/L (ref 96–106)
Creatinine, Ser: 1.08 mg/dL — ABNORMAL HIGH (ref 0.57–1.00)
Globulin, Total: 2 g/dL (ref 1.5–4.5)
Glucose: 113 mg/dL — ABNORMAL HIGH (ref 70–99)
Potassium: 4.2 mmol/L (ref 3.5–5.2)
Sodium: 139 mmol/L (ref 134–144)
Total Protein: 6.4 g/dL (ref 6.0–8.5)
eGFR: 53 mL/min/1.73 — ABNORMAL LOW (ref >59)

## 2024-08-18 LAB — LIPID PANEL W/O CHOL/HDL RATIO
Cholesterol, Total: 132 mg/dL (ref 100–199)
HDL: 69 mg/dL (ref >39)
LDL Chol Calc (NIH): 37 mg/dL (ref 0–99)
Triglycerides: 163 mg/dL — ABNORMAL HIGH (ref 0–149)
VLDL Cholesterol Cal: 26 mg/dL (ref 5–40)

## 2024-08-18 NOTE — Progress Notes (Signed)
 Contacted via MyChart  Good afternoon Jesyca, your labs have returned and overall remain stable. - Kidneys, creatinine and eGFR, shows very mild kidney disease Stage 3a. Continue to hydrate well at home and avoid Ibuprofen products. - Everything else is stable. No medication changes needed.  Any questions? Keep being amazing!!  Thank you for allowing me to participate in your care.  I appreciate you. Kindest regards, Rosalin Buster

## 2024-08-18 NOTE — Telephone Encounter (Signed)
 1st call to pharmacy, left message asking them to call back.

## 2024-08-19 ENCOUNTER — Telehealth: Payer: Self-pay | Admitting: Nurse Practitioner

## 2024-08-19 MED ORDER — ERYTHROMYCIN 5 MG/GM OP OINT: 1.0000 | TOPICAL_OINTMENT | Freq: Every day | OPHTHALMIC | 0 refills | Status: AC

## 2024-08-19 NOTE — Telephone Encounter (Signed)
 Copied from CRM #[id number]. Topic: Clinical - Prescription Issue >> Aug 19, 2024  8:24 AM Sharon [name] wrote: Reason for CRM: [facility] called in to report that cream erythromycin  was ordered but was ordered as a 30 MG. Pharmacist Lamar says they only provide the 3.5mg  tube that would be 9 tubes they provide to the pt.   Wanted clarification   Pls follow up [phone]

## 2024-08-23 ENCOUNTER — Encounter: Payer: Self-pay | Admitting: Pulmonary Disease

## 2024-08-23 ENCOUNTER — Ambulatory Visit (INDEPENDENT_AMBULATORY_CARE_PROVIDER_SITE_OTHER): Admitting: Pulmonary Disease

## 2024-08-23 ENCOUNTER — Ambulatory Visit

## 2024-08-23 VITALS — BP 104/60 | HR 89 | Temp 97.9°F | Ht 69.0 in | Wt 194.2 lb

## 2024-08-23 DIAGNOSIS — R0989 Other specified symptoms and signs involving the circulatory and respiratory systems: Secondary | ICD-10-CM | POA: Diagnosis not present

## 2024-08-23 DIAGNOSIS — R058 Other specified cough: Secondary | ICD-10-CM

## 2024-08-23 DIAGNOSIS — J449 Chronic obstructive pulmonary disease, unspecified: Secondary | ICD-10-CM

## 2024-08-23 LAB — PULMONARY FUNCTION TEST
DL/VA % pred: 115 %
DL/VA: 4.6 ml/min/mmHg/L
DLCO cor % pred: 99 %
DLCO cor: 21.81 ml/min/mmHg
DLCO unc % pred: 101 %
DLCO unc: 22.2 ml/min/mmHg
FEF 25-75 Post: 2.46 L/s
FEF 25-75 Pre: 1.66 L/s
FEF2575-%Change-Post: 48 %
FEF2575-%Pred-Post: 136 %
FEF2575-%Pred-Pre: 91 %
FEV1-%Change-Post: 9 %
FEV1-%Pred-Post: 87 %
FEV1-%Pred-Pre: 80 %
FEV1-Post: 2.19 L
FEV1-Pre: 2 L
FEV1FVC-%Change-Post: 0 %
FEV1FVC-%Pred-Pre: 103 %
FEV6-%Change-Post: 7 %
FEV6-%Pred-Post: 88 %
FEV6-%Pred-Pre: 82 %
FEV6-Post: 2.82 L
FEV6-Pre: 2.62 L
FEV6FVC-%Change-Post: 0 %
FEV6FVC-%Pred-Post: 104 %
FEV6FVC-%Pred-Pre: 105 %
FVC-%Change-Post: 8 %
FVC-%Pred-Post: 85 %
FVC-%Pred-Pre: 78 %
FVC-Post: 2.84 L
FVC-Pre: 2.62 L
Post FEV1/FVC ratio: 77 %
Post FEV6/FVC ratio: 99 %
Pre FEV1/FVC ratio: 77 %
Pre FEV6/FVC Ratio: 100 %

## 2024-08-23 NOTE — Patient Instructions (Signed)
 Full PFT completed today without pleth.

## 2024-08-23 NOTE — Progress Notes (Unsigned)
 Subjective:    Patient ID: Sharon [name], female    DOB: 07-May-1946, 78 y.o.   MRN: [id number]  Patient Care Team: Valerio Melanie DASEN, NP as PCP - General (Nurse Practitioner) Perla Evalene PARAS, MD as PCP - Cardiology (Cardiology) Fernande Elspeth BROCKS, MD (Inactive) as PCP - Electrophysiology (Cardiology) Dessa Reyes ORN, MD as Consulting Physician (General Surgery) Daphane Rosella, NP (Inactive) as Nurse Practitioner (Nurse Practitioner) Perla Evalene PARAS, MD as Consulting Physician (Cardiology) Merlynn Lyle CROME, LCSW as Social Worker (Licensed Clinical Social Worker) Deanna Rosella LABOR, [facility] (Pharmacist)  Chief Complaint  Patient presents with   COPD    BACKGROUND/INTERVAL:Patient is a 78 year old former smoker (28 PY), with a history as noted below, who presents for follow-up of COPD.  She was initially seen here on 17 May 2024 she had PFTs today.  HPI Discussed the use of AI scribe software for clinical note transcription with the patient, who gave verbal consent to proceed.  History of Present Illness   Sharon [name] is a 78 year old female who presents with shortness of breath.  She experiences shortness of breath and attributes her stable lung function, currently at 80-87%, to quitting smoking over eight years ago. She continues to use Breztri  and albuterol  for her respiratory condition. She is concerned about respiratory infections due to changing weather and exposure to care workers and her husband, who is on dialysis.  She has a 'snotty nose' which she attributes to seasonal changes, noting the presence of mold in leaves and fluctuating temperatures. She has received her flu shot but has not yet received the RSV shot, expressing concern about potential side effects after hearing from others who felt unwell post-vaccination.  She experiences occasional chest tightness and wonders about the cause. She follows up with her cardiologist regularly and is scheduled to see her next  month.  She does note that albuterol  relieves the chest tightness.  Cardiology has excluded cardiac etiology.  Pulmonary function testing today showed an FEV1 of 2.0 L or 80% predicted, FVC of 2.68 L or 78% predicted, FEV1/FVC of 77%, patient was unable to perform lung volumes due to claustrophobia.  Diffusion capacity was normal.   Review of Systems A 10 point review of systems was performed and it is as noted above otherwise negative.   Patient Active Problem List   Diagnosis Date Noted   Peripheral sensory neuropathy due to type 2 diabetes mellitus (HCC) 04/19/2023   Chronic diastolic CHF (congestive heart failure) (HCC) 07/04/2022   History of colonic polyps    Rectal polyp    Peripheral vascular disease due to secondary diabetes (HCC) 01/11/2021   Osteopenia of neck of left femur 01/11/2021   Drug-induced myopathy 09/13/2020   Hyperlipidemia associated with type 2 diabetes mellitus (HCC) 02/14/2020   Obesity 02/14/2020   Vitamin D  deficiency 06/28/2019   Type 2 diabetes mellitus with morbid obesity (HCC) 06/28/2019   GERD (gastroesophageal reflux disease) 12/07/2018   Vitamin B12 deficiency 12/05/2018   Senile purpura 07/28/2018   Lymphedema 07/14/2018   Aneurysm of descending thoracic aorta 05/04/2018   Allergic rhinitis 02/16/2018   Aortic atherosclerosis 01/13/2018   History of CVA (cerebrovascular accident)    CAD (coronary artery disease) 11/25/2017   Caregiver stress 11/25/2017   Stress incontinence 06/19/2017   Personal history of tobacco use, presenting hazards to health 04/01/2017   Anxiety 03/13/2017   Advanced care planning/counseling discussion 10/17/2016   Centrilobular emphysema (HCC) 10/17/2015    Social History  Tobacco Use   Smoking status: Former    Current packs/day: 0.00    Average packs/day: 0.5 packs/day for 56.0 years (28.0 ttl pk-yrs)    Types: Cigarettes    Start date: 07/14/1960    Quit date: 07/14/2016    Years since quitting: 8.1    Smokeless tobacco: Never  Substance Use Topics   Alcohol use: Yes    Comment: occassional Margarita    Allergies  Allergen Reactions   Statins Other (See Comments)    Myalgias and joint pain on atorvastatin  40mg  and rosuvastatin  20mg    Dermacerin [Aquaphilic]    Elidel  [Pimecrolimus ]     Patient reports allergy   Nsaids    Pineapple Itching    throat   Praluent  [Alirocumab ]     Flu like symptoms, leg swelling, rash   Quinolones     FLUOROQUINOLONES - Pt reports she was told to NEVER take these.   Baclofen  Swelling   Bactrim  [Sulfamethoxazole -Trimethoprim ] Nausea And Vomiting   Ibuprofen Itching and Rash    Mouth swelling and itching   Lanolin Rash    Allergy to triamcinolone     Latex Rash    Some bandaids, some gloves; BLOOD TEST NEGATIVE   Librium [Chlordiazepoxide] Itching    Dizziness    Naprosyn [Naproxen] Rash    Mouth swelling and swelling   Other Rash    Brazil nuts - mouth swelling    Current Meds  Medication Sig   acetaminophen  (TYLENOL ) 500 MG tablet Take 500 mg by mouth every 6 (six) hours as needed.   albuterol  (VENTOLIN  HFA) 108 (90 Base) MCG/ACT inhaler INHALE 2 PUFFS BY MOUTH EVERY 6 HOURS AS NEEDED FOR WHEEZING OR SHORTNESS OF BREATH   aspirin  EC 81 MG tablet Take 81 mg by mouth 3 (three) times a week. Swallow whole. M-W-Fr   Blood Glucose Monitoring Suppl (ONETOUCH VERIO) w/Device KIT Utilize to check blood sugar twice a day, fasting in morning with goal < 130 and then 2 hours after a meal with goal <180.  Document and bring to visits.   BREZTRI  AEROSPHERE 160-9-4.8 MCG/ACT AERO inhaler INHALE 2 PUFFS BY MOUTH TWICE DAILY AS DIRECTED   Cholecalciferol  (VITAMIN D3) 50 MCG (2000 UT) CAPS Take 1 capsule by mouth daily.   citalopram  (CELEXA ) 20 MG tablet Take 1 tablet (20 mg total) by mouth as needed.   clopidogrel  (PLAVIX ) 75 MG tablet Take 1 tablet (75 mg total) by mouth daily.   Crisaborole  (EUCRISA ) 2 % OINT Apply 1 application  topically daily. Use for  itching and inflammation at left leg   Cyanocobalamin  1000 MCG/ML KIT Inject 1mL into the muscle once a month (Patient taking differently: Inject 1,000 mcg into the muscle every 30 (thirty) days. Inject 1mL into the muscle once a month)   erythromycin  ophthalmic ointment Place 1 Application into the left eye at bedtime for 7 days.   ezetimibe  (ZETIA ) 10 MG tablet Take 1 tablet (10 mg total) by mouth daily.   gabapentin (NEURONTIN) 100 MG tablet Take 100 mg by mouth 2 (two) times daily.   glucose blood (ONETOUCH VERIO) test strip UTILIIZE TO CHECK BLOOD SUGAR TWICE DAILY FASTING IN MORNING WITH GOAL<130 AND THAN 2 HOURS AFTER A MEAL WITH GGOAL<180   Glycerin-Polysorbate 80 (REFRESH DRY EYE THERAPY OP) Apply to eye.   Lancets (ONETOUCH ULTRASOFT) lancets Utilize to check blood sugar twice a day, fasting in morning with goal < 130 and then 2 hours after a meal with goal <180.  Document and  bring to visits.   loratadine  (CLARITIN ) 10 MG tablet Take 1 tablet (10 mg total) by mouth daily.   meclizine  (ANTIVERT ) 12.5 MG tablet Take 12.5 mg by mouth 3 (three) times daily. (Patient taking differently: Take 12.5 mg by mouth 3 (three) times daily. PATIENT TAKING AS NEEDED)   mometasone  (ELOCON ) 0.1 % cream Apply 1 Application topically 2 (two) times daily. As needed for flares up to two weeks only   pantoprazole  (PROTONIX ) 40 MG tablet TAKE 1 TABLET(40 MG) BY MOUTH DAILY   rosuvastatin  (CRESTOR ) 5 MG tablet TAKE 1 TABLET(5 MG) BY MOUTH DAILY   Ruxolitinib Phosphate  (OPZELURA ) 1.5 % CREA Apply topically to legs twice daily for dermatitis   Syringe/Needle, Disp, 22G X 1-1/2 1 ML MISC Use to inject b12 monthly    Immunization History  Administered Date(s) Administered   Fluad Quad(high Dose 65+) 07/21/2019, 07/17/2020, 08/23/2021, 07/28/2022   Fluad Trivalent(High Dose 65+) 06/17/2023   INFLUENZA, HIGH DOSE SEASONAL PF 06/17/2018, 06/30/2024   Influenza-Unspecified 08/09/2015, 06/18/2016, 07/17/2017    PFIZER Comirnaty(Gray Top)Covid-19 Tri-Sucrose Vaccine 05/02/2021   PFIZER(Purple Top)SARS-COV-2 Vaccination 11/27/2019, 12/27/2019, 08/06/2020   Pneumococcal Conjugate-13 09/25/2014   Pneumococcal-Unspecified 04/07/2012   Td 09/25/2014        Objective:     BP 104/60   Pulse 89   Temp 97.9 F (36.6 C) (Temporal)   Ht 5' 9 (1.753 m)   Wt 194 lb 3.2 oz (88.1 kg)   LMP  (LMP Unknown)   SpO2 97%   BMI 28.68 kg/m   SpO2: 97 %  GENERAL: Well-developed, well-nourished woman, no acute distress.  Fully ambulatory.  No conversational dyspnea. HEAD: Normocephalic, atraumatic.  EYES: Pupils equal, round, reactive to light.  No scleral icterus.  MOUTH: Multiple caps.  Partial dentures lower.  Otherwise teeth intact.  Oral mucosa moist.  No thrush. NECK: Supple. No thyromegaly. Trachea midline. No JVD.  No adenopathy. PULMONARY: Good air entry bilaterally.  No adventitious sounds. CARDIOVASCULAR: S1 and S2. Regular rate and rhythm.  No rubs, murmurs or gallops heard. ABDOMEN: Benign. MUSCULOSKELETAL: No joint deformity, no clubbing, no edema.  NEUROLOGIC: No overt focal deficit, no gait disturbance, speech is fluent. SKIN: Intact,warm,dry. PSYCH: Mood and behavior normal.  Recent Results   Pulmonary function test     Collection Time: 08/23/24  1:45 PM  Result Value Ref Range   FVC-Pre 2.62 L   FVC-%Pred-Pre 78 %   FVC-Post 2.84 L   FVC-%Pred-Post 85 %   FVC-%Change-Post 8 %   FEV1-Pre 2.00 L   FEV1-%Pred-Pre 80 %   FEV1-Post 2.19 L   FEV1-%Pred-Post 87 %   FEV1-%Change-Post 9 %   FEV6-Pre 2.62 L   FEV6-%Pred-Pre 82 %   FEV6-Post 2.82 L   FEV6-%Pred-Post 88 %   FEV6-%Change-Post 7 %   Pre FEV1/FVC ratio 77 %   FEV1FVC-%Pred-Pre 103 %   Post FEV1/FVC ratio 77 %   FEV1FVC-%Change-Post 0 %   Pre FEV6/FVC Ratio 100 %   FEV6FVC-%Pred-Pre 105 %   Post FEV6/FVC ratio 99 %   FEV6FVC-%Pred-Post 104 %   FEV6FVC-%Change-Post 0 %   FEF 25-75 Pre 1.66 L/sec    FEF2575-%Pred-Pre 91 %   FEF 25-75 Post 2.46 L/sec   FEF2575-%Pred-Post 136 %   FEF2575-%Change-Post 48 %   DLCO unc 22.20 ml/min/mmHg   DLCO unc % pred 101 %   DLCO cor 21.81 ml/min/mmHg   DLCO cor % pred 99 %   DL/VA 5.39 ml/min/mmHg/L   DL/VA % pred 884 %  *  PFTs discussed with patient.       Assessment & Plan:     ICD-10-CM   1. Stage 1 mild COPD by GOLD classification (HCC)  J44.9     2. Post-viral cough syndrome  R05.8      Discussion:    Chronic obstructive pulmonary disease (COPD) COPD is well-managed with lung function at 80-87%. No current exacerbations. Occasional chest tightness, possibly stress-related. Continues Breztri  and albuterol . Concerns about RSV vaccination due to potential malaise, but advised it is generally well-tolerated and not required annually. - Continue Breztri  and albuterol  as prescribed. - Consider RSV vaccination, spacing it 3-4 weeks apart from the flu shot. - Follow up with cardiologist next month for chest tightness evaluation. - Scheduled follow-up appointment in 4 months.    Postviral cough syndrome resolved.  Advised if symptoms do not improve or worsen, to please contact office for sooner follow up or seek emergency care.    I spent 30 minutes of dedicated to the care of this patient on the date of this encounter to include pre-visit review of records, face-to-face time with the patient discussing conditions above, post visit ordering of testing, clinical documentation with the electronic health record, making appropriate referrals as documented, and communicating necessary findings to members of the patients care team.     C. Leita Sanders, MD Advanced Bronchoscopy PCCM [facility]    *This note was generated using voice recognition software/Dragon and/or AI transcription program.  Despite best efforts to proofread, errors can occur which can change the meaning. Any transcriptional errors that result from this  process are unintentional and may not be fully corrected at the time of dictation.

## 2024-08-23 NOTE — Patient Instructions (Signed)
 VISIT SUMMARY:  Today, we discussed your respiratory health, including your COPD management, concerns about respiratory infections, and occasional chest tightness. We also reviewed your recent flu shot and discussed the RSV vaccination.  YOUR PLAN:  -CHRONIC OBSTRUCTIVE PULMONARY DISEASE (COPD): COPD is a chronic lung condition that makes it hard to breathe. Your lung function is stable at 80-87%, and you are managing well with Breztri  and albuterol . Continue using these medications as prescribed. We discussed your concerns about the RSV vaccination, and I advised that it is generally well-tolerated. You may consider getting it, spacing it 3-4 weeks apart from your flu shot. Follow up with your cardiologist next month to evaluate the occasional chest tightness.  INSTRUCTIONS:  Please continue using Breztri  and albuterol  as prescribed. Consider getting the RSV vaccination, spacing it 3-4 weeks apart from your flu shot. Follow up with your cardiologist next month for the chest tightness. We will see you again in 4 months for a follow-up appointment.

## 2024-08-23 NOTE — Progress Notes (Signed)
 Full PFT completed today without pleth due claustrophobia.

## 2024-08-29 ENCOUNTER — Ambulatory Visit

## 2024-08-29 DIAGNOSIS — E538 Deficiency of other specified B group vitamins: Secondary | ICD-10-CM | POA: Diagnosis not present

## 2024-08-29 MED ORDER — CYANOCOBALAMIN 1000 MCG/ML IJ SOLN
1000.0000 ug | Freq: Once | INTRAMUSCULAR | Status: AC
  Administered 2024-08-29: 1000 ug via INTRAMUSCULAR

## 2024-08-30 ENCOUNTER — Ambulatory Visit: Payer: Self-pay | Admitting: Pulmonary Disease

## 2024-09-05 ENCOUNTER — Other Ambulatory Visit: Payer: Self-pay | Admitting: Nurse Practitioner

## 2024-09-06 NOTE — Telephone Encounter (Signed)
 Requested medication (s) are due for refill today: Yes  Requested medication (s) are on the active medication list: Yes   Last refill:  06/24/2024  Future visit scheduled:Yes  Notes to clinic:

## 2024-09-10 ENCOUNTER — Other Ambulatory Visit: Payer: Self-pay | Admitting: Dermatology

## 2024-09-10 DIAGNOSIS — I872 Venous insufficiency (chronic) (peripheral): Secondary | ICD-10-CM

## 2024-09-13 MED ORDER — CYANOCOBALAMIN 1000 MCG/ML IJ SOLN
1000.0000 ug | Freq: Once | INTRAMUSCULAR | Status: AC
  Administered 2024-08-29: 1000 ug via INTRAMUSCULAR

## 2024-09-13 NOTE — Addendum Note (Signed)
 Addended by: Quitman Norberto T on: 09/13/2024 02:37 PM   Modules accepted: Orders

## 2024-09-16 ENCOUNTER — Ambulatory Visit: Admitting: Physician Assistant

## 2024-09-16 NOTE — Progress Notes (Signed)
 Patient is in office today for a nurse visit for B12 Injection. Patient Injection was given in the  Left deltoid. Patient tolerated injection well.

## 2024-09-22 NOTE — Progress Notes (Unsigned)
 Cardiology Office Note    Date:  09/29/2024   ID:  Sharon, [name] Apr 22, 1946, MRN [id number]  PCP:  Valerio Melanie DASEN, NP  Cardiologist:  Evalene Lunger, MD  Electrophysiologist:  Elspeth Sage, MD (Inactive)   Chief Complaint: Follow up  History of Present Illness:   Sharon [name] is a 78 y.o. 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  She was found to have multiple right cerebral infarcts concerning for embolic phenomena.  Carotid artery ultrasound showed minor carotid artery atherosclerosis with no hemodynamically significant disease.  Surface echo showed an EF of 70% with no regional wall motion abnormalities.  TEE showed an EF of 55 to 65%, no regional wall motion abnormalities, no left atrial appendage thrombus, and no cardiac source of emboli identified, negative bubble study, normal sized aortic root, ascending thoracic aorta, aortic arch, and descending thoracic aorta.  She subsequently underwent ILR implantation with no evidence of A. fib identified.  Echo from 09/2018 showed an EF of 55 to 60%, no regional wall motion abnormalities, grade 1 diastolic dysfunction, upper limit of normal PASP, no significant valvular abnormalities, and a small pericardial effusion identified posterior to the heart.  She was seen in the office in 02/2021 for pre-procedure cardiac risk stratification and was doing well from a cardiac perspective.  She was referred to the  lipid clinic for further management of her hyperlipidemia, and was started on Praluent .  However, this has been discontinued secondary to injection site reaction and influenza-like symptoms.   She was admitted to the hospital in 09/2021 with contact/stasis dermatitis of the left lower extremity.  Venous Doppler was negative for DVT.  Initially, she was treated with antibiotics, though these were discontinued per ID recommendation.  She had notable improvement following initiation of steroid for contact dermatitis.     She was seen in 10/2021 noting improvement in her stasis dermatitis.  With regards to her history of CVA, ILR showed no evidence of A-fib with loop recorder being explanted in 12/2021.   Echo in 12/2021 demonstrated an EF of 55 to 60%, no regional wall motion abnormalities, grade 2 diastolic dysfunction, normal RV systolic function and ventricular cavity size, trivial mitral regurgitation, and an estimated right atrial pressure of 3 mmHg.    Chest CT 07/2022 showed stable aneurysmal dilatation of the descending thoracic aorta measuring 4 cm.   She was seen in the ED in 06/2022 left left arm tingling and numbness with radiation to her face.  CT head showed no acute intracranial hemorrhage or evidence of infarct.  MRI of the brain showed no acute intracranial abnormality.  MRA of the head/neck showed no major intracranial arterial occlusion or significant proximal stenosis with severe left vertebral artery origin stenosis and widely patent cervical carotid arteries.  She was admitted later in 06/2022 with slurred speech with CT of the head showing focal hypodensity involving the right insular cortex suspicious for an evolving acute right MCA distribution infarct.  MRI of the brain showed an  acute ischemic right MCA territory infarct without associated hemorrhage or mass effect.  MRA of the head/neck showed interval right M2 branch occlusion consistent with right MCA territory infarct with wide  patency of the carotid artery systems.  The previously noted stenosis at the origin of the left vertebral artery was more mild to moderate in appearance.  Echo during that admission showed an EF of 55 to 60%, no regional wall motion abnormalities, grade 1 diastolic dysfunction, normal RV systolic function and ventricular cavity size, aortic valve sclerosis without evidence of stenosis, and an estimated right atrial pressure of 3 mmHg.  She was seen in the ED in 10/2022 with dizziness and weakness.  CT of the head showed no evidence of hemorrhage or acute infarct.  MRI of the brain showed no acute intracranial process.  She was referred to EP for consideration of reimplantation of loop recorder given recurrent cryptogenic stroke with no further plans for EP evaluation.  She was referred to the lipid clinic given documented intolerance to atorvastatin , rosuvastatin , and Praluent .  Insurance would not cover Leqvio.  She was rechallenged on low-dose rosuvastatin , though was concerned this may have been contributing to headache.   AAA ultrasound in 03/2024 demonstrated the largest aortic measurement of 3.3 cm which was unchanged when compared to study from 03/2023 and 02/2022.     She was last seen in the office in 04/2024 and was doing well from a cardiac perspective.  She comes in doing well from a cardiac perspective and is without symptoms of angina or cardiac decompensation.  She is under increased stress at home surrounding the health of her husband.  No dizziness, presyncope, or syncope.  Continues to wear compression socks.  No progressive orthopnea.  No falls or symptoms concerning for bleeding.  No new stroke deficits.   Labs independently reviewed: 08/2024 - TC 132, TG 163, HDL 69, LDL 37, magnesium 2.1, BUN 17, serum creatinine 1.08, potassium 4.2, albumin 4.4, AST/ALT normal, Hgb 14.0, PLT 216, A1c 6.0 05/2024 - TSH normal  Past Medical History:  Diagnosis Date   Aneurysm of descending thoracic  aorta    Anxiety    Arthritis    hands, upper back   Asthma    Cervical cancer (HCC)    in situ 1970's   CHF (congestive heart failure) (HCC)    Claustrophobia    COPD (chronic obstructive pulmonary disease) (HCC)    Coronary artery disease    COVID    GERD (gastroesophageal reflux disease)    History of cervical cancer    HLD (hyperlipidemia)    Lymphedema    Menopausal disorder    Neuropathy    Osteoporosis    Pneumonia 1960   Pre-diabetes    PVD (peripheral vascular disease)    Spasm of abdominal muscles of right side    intermittent   Stroke (HCC) 2019   TMJ (dislocation of temporomandibular joint)    Wears dentures    partial lower    Past Surgical History:  Procedure Laterality Date   bladder botox  2005   BLADDER SUSPENSION  2004   CATARACT EXTRACTION W/PHACO Right 03/09/2018   Procedure: CATARACT EXTRACTION PHACO AND INTRAOCULAR LENS PLACEMENT (IOC) right;  Surgeon: Myrna Adine Anes, MD;  Location: [facility];  Service: Ophthalmology;  Laterality: Right;  CALL CELL 1ST   CATARACT EXTRACTION W/PHACO Left 04/19/2018   Procedure: CATARACT EXTRACTION PHACO AND INTRAOCULAR LENS PLACEMENT (IOC)  LEFT;  Surgeon: Myrna Adine Anes, MD;  Location: [facility];  Service: Ophthalmology;  Laterality: Left;   CHOLECYSTECTOMY, LAPAROSCOPIC  02/18/2023   COLONOSCOPY WITH PROPOFOL  N/A 12/13/2015   Procedure: COLONOSCOPY WITH PROPOFOL ;  Surgeon: Rogelia Copping, MD;  Location: [facility];  Service: Endoscopy;  Laterality: N/A;   COLONOSCOPY WITH PROPOFOL  N/A 03/14/2021   Procedure: COLONOSCOPY WITH PROPOFOL ;  Surgeon: Copping Rogelia, MD;  Location: [facility];  Service: Endoscopy;  Laterality: N/A;  diabetic   LOOP RECORDER INSERTION N/A 01/07/2018   Procedure: LOOP RECORDER INSERTION;  Surgeon: Fernande Elspeth BROCKS, MD;  Location: [facility];  Service: Cardiovascular;  Laterality: N/A;   LOOP RECORDER REMOVAL     POLYPECTOMY N/A 12/13/2015    Procedure: POLYPECTOMY;  Surgeon: Rogelia Copping, MD;  Location: [facility];  Service: Endoscopy;  Laterality: N/A;  SIGMOID COLON POLYPS X  5   POLYPECTOMY N/A 03/14/2021   Procedure: POLYPECTOMY;  Surgeon: Copping Rogelia, MD;  Location: [facility];  Service: Endoscopy;  Laterality: N/A;   SHOULDER ARTHROSCOPY W/ ROTATOR CUFF REPAIR Right 1998   TEE WITHOUT CARDIOVERSION N/A 01/06/2018   Procedure: TRANSESOPHAGEAL ECHOCARDIOGRAM (TEE);  Surgeon: Gollan, Timothy J, MD;  Location: [facility];  Service: Cardiovascular;  Laterality: N/A;   TONSILLECTOMY AND ADENOIDECTOMY     TOOTH EXTRACTION     July 2023   VAGINAL HYSTERECTOMY  1970's    Current Medications: Active Medications[1]  Allergies:   Statins, Dermacerin [aquaphilic], Elidel  [pimecrolimus ], Nsaids, Pineapple, Praluent  [alirocumab ], Quinolones, Baclofen , Bactrim  [sulfamethoxazole -trimethoprim ], Ibuprofen, Lanolin, Latex, Librium [chlordiazepoxide], Naprosyn [naproxen], and Other   Social History   Socioeconomic History   Marital status: Married    Spouse name: Deshunda Thackston   Number of children: 1   Years of education: Not on file   Highest education level: Some college, no degree  Occupational History   Occupation: retired  Tobacco Use   Smoking status: Former    Current packs/day: 0.00    Average packs/day: 0.5 packs/day for 56.0 years (28.0 ttl pk-yrs)    Types: Cigarettes    Start date: 07/14/1960    Quit date: 07/14/2016    Years since quitting: 8.2   Smokeless tobacco: Never  Vaping Use   Vaping status: Never Used  Substance and Sexual Activity   Alcohol use: Yes    Comment: occassional Margarita   Drug use: No   Sexual activity: Not Currently    Birth control/protection: Post-menopausal  Other Topics Concern   Not on file  Social History Narrative   Lives with husband, manages farm   Social Drivers of Health   Tobacco Use: Medium Risk (09/29/2024)   Patient History    Smoking Tobacco Use:  Former    Smokeless Tobacco Use: Never    Passive Exposure: Not on Actuary Strain: Low Risk (07/22/2023)   Overall Financial Resource Strain (CARDIA)    Difficulty of Paying Living Expenses: Not hard at all  Food Insecurity: No Food Insecurity (07/22/2023)   Hunger Vital Sign    Worried About Running Out of Food in the Last Year: Never true    Ran Out of Food in the Last Year: Never true  Transportation Needs: No Transportation Needs (07/22/2023)   PRAPARE - Administrator, Civil Service (Medical): No    Lack of Transportation (Non-Medical): No  Physical Activity: Insufficiently Active (07/22/2023)   Exercise Vital Sign    Days of Exercise per Week: 3 days    Minutes of Exercise per Session: 30 min  Stress:  No Stress Concern Present (07/22/2023)   [employer] of Occupational Health - Occupational Stress Questionnaire    Feeling of Stress : Only a little  Social Connections: Moderately Isolated (07/22/2023)   Social Connection and Isolation Panel    Frequency of Communication with Friends and Family: Twice a week    Frequency of Social Gatherings with Friends and Family: Twice a week    Attends Religious Services: Never    Database Administrator or Organizations: No    Attends Banker Meetings: Never    Marital Status: Married  Depression (PHQ2-9): Low Risk (08/17/2024)   Depression (PHQ2-9)    PHQ-2 Score: 1  Alcohol Screen: Low Risk (07/22/2023)   Alcohol Screen    Last Alcohol Screening Score (AUDIT): 0  Housing: Unknown (11/17/2023)   Received from [facility]   Epic    Unable to Pay for Housing in the Last Year: Not on file    Number of Times Moved in the Last Year: Not on file    At any time in the past 12 months, were you homeless or living in a shelter (including now)?: No  Utilities: Not At Risk (07/22/2023)   AHC Utilities    Threatened with loss of utilities: No  Health Literacy: Adequate Health Literacy  (07/22/2023)   B1300 Health Literacy    Frequency of need for help with medical instructions: Never     Family History:  The patient's family history includes Diabetes in her mother; Heart disease in her mother; Stroke in her maternal grandmother and mother. There is no history of Hyperlipidemia.  ROS:   12-point review of systems is negative unless otherwise noted in the HPI.   EKGs/Labs/Other Studies Reviewed:    Studies reviewed were summarized above. The additional studies were reviewed today:  2D echo 07/03/2022: 1. Left ventricular ejection fraction, by estimation, is 55 to 60%. The  left ventricle has normal function. The left ventricle has no regional  wall motion abnormalities. Left ventricular diastolic parameters are  consistent with Grade I diastolic  dysfunction (impaired relaxation).   2. Right ventricular systolic function is normal. The right ventricular  size is normal.   3. The mitral valve is normal in structure. No evidence of mitral valve  regurgitation. No evidence of mitral stenosis.   4. The aortic valve is normal in structure. There is mild calcification  of the aortic valve. Aortic valve regurgitation is not visualized. Aortic  valve sclerosis/calcification is present, without any evidence of aortic  stenosis.   5. The inferior vena cava is normal in size with greater than 50%  respiratory variability, suggesting right atrial pressure of 3 mmHg.  __________   AAA ultrasound 02/27/2022 (vascular surgery): Summary:  Abdominal Aorta: The largest aortic measurement is 3.3 cm. Mild dilitation  again seen to the proximal abdominal aorta with normal tapering distally  to the distal aorta. Normal caliber CIA's bilaterally. Normal flow  throughout. The largest aortic diameter   remains essentially unchanged compared to prior exam. Previous diameter  measurement was 3.2 cm obtained on 08/2021.  __________   2D echo 12/25/2021: 1. Left ventricular ejection  fraction, by estimation, is 55 to 60%. The  left ventricle has normal function. The left ventricle has no regional  wall motion abnormalities. Left ventricular diastolic parameters are  consistent with Grade II diastolic  dysfunction (pseudonormalization).   2. Right ventricular systolic function is normal. The right ventricular  size is normal.   3. The  mitral valve is normal in structure. Trivial mitral valve  regurgitation.   4. The aortic valve was not well visualized. Aortic valve regurgitation  is not visualized.   5. The inferior vena cava is normal in size with greater than 50%  respiratory variability, suggesting right atrial pressure of 3 mmHg. __________   2D echo 09/2018: - Left ventricle: The cavity size was normal. There was mild    concentric hypertrophy. Systolic function was normal. The    estimated ejection fraction was in the range of 55% to 60%. Wall    motion was normal; there were no regional wall motion    abnormalities. Doppler parameters are consistent with abnormal    left ventricular relaxation (grade 1 diastolic dysfunction).  - Pulmonary arteries: Systolic pressure was at the upper limits of    normal.  - Pericardium, extracardiac: A small pericardial effusion was    identified posterior to the heart. __________   TEE 12/2017: - Left ventricle: Systolic function was normal. The estimated    ejection fraction was in the range of 55% to 65%. Wall motion was    normal; there were no regional wall motion abnormalities.  - Left atrium: No evidence of thrombus in the atrial cavity or    appendage. No evidence of thrombus in the atrial cavity or    appendage.  - Right ventricle: Systolic function was normal.  - Right atrium: No evidence of thrombus in the atrial cavity or    appendage.  - Atrial septum: No defect or patent foramen ovale was identified.    Echo contrast study showed no right-to-left atrial level shunt,    at baseline or with provocation.    Impressions:   - No cardiac source of emboli was indentified.  __________   2D echo 12/2017: - Procedure narrative: Transthoracic echocardiography. Image    quality was suboptimal. The study was technically difficult.  - Left ventricle: The cavity size was normal. Systolic function was    vigorous. The estimated ejection fraction was 70%. Wall motion    was normal; there were no regional wall motion abnormalities.  - Aortic valve: Valve area (Vmax): 2.92 cm^2.   Impressions:   - No cardiac source of emboli was indentified.   EKG:  EKG is ordered today.  The EKG ordered today demonstrates NSR, 75 bpm, rare PAC, low voltage QRS, no acute ST-T changes  Recent Labs: 05/16/2024: TSH 1.080 08/17/2024: ALT 13; BUN 17; Creatinine, Ser 1.08; Hemoglobin 14.0; Magnesium 2.1; Platelets 216; Potassium 4.2; Sodium 139  Recent Lipid Panel    Component Value Date/Time   CHOL 132 08/17/2024 1422   TRIG 163 (H) 08/17/2024 1422   HDL 69 08/17/2024 1422   CHOLHDL 3.6 07/03/2022 0609   VLDL 14 07/03/2022 0609   LDLCALC 37 08/17/2024 1422   LDLDIRECT 142.3 (H) 03/21/2021 1024    PHYSICAL EXAM:    VS:  BP 126/72 (BP Location: Left Arm, Patient Position: Sitting, Cuff Size: Normal)   Pulse 75   Ht 5' 9 (1.753 m)   Wt 193 lb (87.5 kg)   LMP  (LMP Unknown)   SpO2 95%   BMI 28.50 kg/m   BMI: Body mass index is 28.5 kg/m.  Physical Exam Vitals reviewed.  Constitutional:      Appearance: She is well-developed.  HENT:     Head: Normocephalic and atraumatic.  Eyes:     General:        Right eye: No discharge.  Left eye: No discharge.  Cardiovascular:     Rate and Rhythm: Normal rate and regular rhythm.     Heart sounds: Normal heart sounds, S1 normal and S2 normal. Heart sounds not distant. No midsystolic click and no opening snap. No murmur heard.    No friction rub.  Pulmonary:     Effort: Pulmonary effort is normal. No respiratory distress.     Breath sounds: Normal breath  sounds. No decreased breath sounds, wheezing, rhonchi or rales.  Musculoskeletal:     Cervical back: Normal range of motion.     Comments: Bilateral compression socks noted.  Skin:    General: Skin is warm and dry.     Nails: There is no clubbing.  Neurological:     Mental Status: She is alert and oriented to person, place, and time.  Psychiatric:        Speech: Speech normal.        Behavior: Behavior normal.        Thought Content: Thought content normal.        Judgment: Judgment normal.     Wt Readings from Last 3 Encounters:  09/29/24 193 lb (87.5 kg)  08/23/24 194 lb 3.2 oz (88.1 kg)  08/23/24 194 lb 3.2 oz (88.1 kg)     ASSESSMENT & PLAN:   Coronary artery calcification/aortic atherosclerosis/HLD: She is doing well and without symptoms concerning for angina or cardiac decompensation.  LDL 37 in 08/2024 with normal AST/ALT at that time.  Remains on rosuvastatin  5 mg daily, does not tolerate higher doses and ezetimibe  10 mg.  No indication for ischemic testing at this time.  Dilated ascending thoracic aorta: Stable, measuring 4.0 cm by CT chest in 08/2023.  Schedule CT chest, has aged out of lung cancer screening.  Prefers to avoid contrast.  AAA: Stable, measuring 3.3 cm on ultrasound in 03/2024.  Continue optimal blood pressure control.  Avoid fluoroquinolones.  Followed by vascular surgery.  History of recurrent cryptogenic CVA: Prior loop recorder implanted in 2019 through 10/2021 showed no evidence of A-fib.  Negative bubble study on TEE in 2019.  Carotid artery ultrasound in 2019 with minor carotid atherosclerosis with no hemodynamically significant ICA stenosis.  Recurrent CVA involving the right MCA territory in 06/2022. CTA head neck in 2023 showed no significant stenosis or ulceration in the right ICA with for age atherosclerosis of the left ICA.  She was reevaluated by EP without recommendation for further monitoring.  Remains on aspirin  81 mg, Monday, Wednesday, and  Friday along with clopidogrel  75 mg daily as previously directed by neurology.  Diastolic dysfunction: Euvolemic and well compensated.  Her weight is down 10 pounds today when compared to her visit in 04/2024, she indicates this is in the setting of increased stress surrounding the health of her husband.  Remains on furosemide  10 mg daily as needed.  Continue optimal blood pressure control.  Lymphedema: Continue with compression stockings and leg elevation. Followed by vascular surgery       Disposition: F/u with Dr. Gollan or an APP in 6 months.   Medication Adjustments/Labs and Tests Ordered: Current medicines are reviewed at length with the patient today.  Concerns regarding medicines are outlined above. Medication changes, Labs and Tests ordered today are summarized above and listed in the Patient Instructions accessible in Encounters.   Signed, Bernardino Bring, PA-C 09/29/2024 3:11 PM     [facility] [place] [address] [place], [place] [address] [phone]     [  1]  Current Meds  Medication Sig   acetaminophen  (TYLENOL ) 500 MG tablet Take 500 mg by mouth every 6 (six) hours as needed.   albuterol  (VENTOLIN  HFA) 108 (90 Base) MCG/ACT inhaler INHALE 2 PUFFS BY MOUTH EVERY 6 HOURS AS NEEDED FOR WHEEZING OR SHORTNESS OF BREATH   aspirin  EC 81 MG tablet Take 81 mg by mouth 3 (three) times a week. Swallow whole. M-W-Fr   Blood Glucose Monitoring Suppl (ONETOUCH VERIO) w/Device KIT Utilize to check blood sugar twice a day, fasting in morning with goal < 130 and then 2 hours after a meal with goal <180.  Document and bring to visits.   BREZTRI  AEROSPHERE 160-9-4.8 MCG/ACT AERO inhaler INHALE 2 PUFFS BY MOUTH TWICE DAILY AS DIRECTED   Cholecalciferol  (VITAMIN D3) 50 MCG (2000 UT) CAPS Take 1 capsule by mouth daily.   citalopram  (CELEXA ) 20 MG tablet Take 1 tablet (20 mg total) by mouth as needed.   clopidogrel  (PLAVIX ) 75 MG tablet Take 1 tablet (75 mg total)  by mouth daily.   Cyanocobalamin  1000 MCG/ML KIT Inject 1mL into the muscle once a month   desoximetasone  (TOPICORT ) 0.05 % cream Apply qd/bid daily to affected areas of lower legs for  2 weeks. Can use for a max of 4 weeks if rash has not improved at 2 weeks Avoid applying to face, groin, and axilla. Use as directed.   EUCRISA  2 % OINT APPLY 1 APPLICATION TOPICALLY DAILY   ezetimibe  (ZETIA ) 10 MG tablet Take 1 tablet (10 mg total) by mouth daily.   furosemide  (LASIX ) 20 MG tablet Take 0.5 tablets (10 mg total) by mouth daily as needed.   gabapentin (NEURONTIN) 100 MG tablet Take 100 mg by mouth 2 (two) times daily. (Patient taking differently: Take 100 mg by mouth as needed.)   glucose blood (ONETOUCH VERIO) test strip UTILIIZE TO CHECK BLOOD SUGAR TWICE DAILY FASTING IN MORNING WITH GOAL<130 AND THAN 2 HOURS AFTER A MEAL WITH GGOAL<180   Glycerin-Polysorbate 80 (REFRESH DRY EYE THERAPY OP) Apply to eye. (Patient taking differently: Apply to eye as needed.)   Lancets (ONETOUCH ULTRASOFT) lancets Utilize to check blood sugar twice a day, fasting in morning with goal < 130 and then 2 hours after a meal with goal <180.  Document and bring to visits.   loratadine  (CLARITIN ) 10 MG tablet Take 1 tablet (10 mg total) by mouth daily.   meclizine  (ANTIVERT ) 12.5 MG tablet Take 12.5 mg by mouth 3 (three) times daily.   pantoprazole  (PROTONIX ) 40 MG tablet TAKE 1 TABLET(40 MG) BY MOUTH DAILY   rosuvastatin  (CRESTOR ) 5 MG tablet TAKE 1 TABLET(5 MG) BY MOUTH DAILY   Ruxolitinib Phosphate  (OPZELURA ) 1.5 % CREA Apply topically to legs twice daily for dermatitis   Syringe/Needle, Disp, 22G X 1-1/2 1 ML MISC Use to inject b12 monthly

## 2024-09-28 ENCOUNTER — Ambulatory Visit

## 2024-09-29 ENCOUNTER — Encounter: Payer: Self-pay | Admitting: Physician Assistant

## 2024-09-29 ENCOUNTER — Ambulatory Visit: Admitting: Physician Assistant

## 2024-09-29 VITALS — BP 126/72 | HR 75 | Ht 69.0 in | Wt 193.0 lb

## 2024-09-29 DIAGNOSIS — E785 Hyperlipidemia, unspecified: Secondary | ICD-10-CM

## 2024-09-29 DIAGNOSIS — I714 Abdominal aortic aneurysm, without rupture, unspecified: Secondary | ICD-10-CM

## 2024-09-29 DIAGNOSIS — I2510 Atherosclerotic heart disease of native coronary artery without angina pectoris: Secondary | ICD-10-CM | POA: Diagnosis not present

## 2024-09-29 DIAGNOSIS — I700 Atherosclerosis of aorta: Secondary | ICD-10-CM | POA: Diagnosis not present

## 2024-09-29 DIAGNOSIS — I639 Cerebral infarction, unspecified: Secondary | ICD-10-CM

## 2024-09-29 DIAGNOSIS — I890 Lymphedema, not elsewhere classified: Secondary | ICD-10-CM

## 2024-09-29 DIAGNOSIS — I5189 Other ill-defined heart diseases: Secondary | ICD-10-CM | POA: Diagnosis not present

## 2024-09-29 DIAGNOSIS — I77810 Thoracic aortic ectasia: Secondary | ICD-10-CM | POA: Diagnosis not present

## 2024-09-29 DIAGNOSIS — I77819 Aortic ectasia, unspecified site: Secondary | ICD-10-CM

## 2024-09-29 NOTE — Addendum Note (Signed)
 Addended by: HANNAH MARCUS KIDD on: 09/29/2024 04:24 PM   Modules accepted: Orders

## 2024-09-29 NOTE — Patient Instructions (Signed)
 Medication Instructions:  Your physician recommends that you continue on your current medications as directed. Please refer to the Current Medication list given to you today.   *If you need a refill on your cardiac medications before your next appointment, please call your pharmacy*  Lab Work: None ordered at this time   Testing/Procedures: CT-scan of the chest without contrast - please call central scheduling [phone]  Does the patient have:  Hypertension: NO Diabetes: NO Contrast allergy: NO Recent labs: NO Any medical devices (spinal stimulator, bladder stimulator, pain pump, pacemaker)? YES History of Ovarian or Colon cancer or Lymphoma: NO Abdominal Surgery in the past 6 weeks: NO   Follow-Up: At [facility], you and your health needs are our priority.  As part of our continuing mission to provide you with exceptional heart care, our providers are all part of one team.  This team includes your primary Cardiologist (physician) and Advanced Practice Providers or APPs (Physician Assistants and Nurse Practitioners) who all work together to provide you with the care you need, when you need it.  Your next appointment:   6 month(s)   Provider:   You may see Timothy Gollan, MD or Bernardino Bring, PA-C

## 2024-09-30 ENCOUNTER — Ambulatory Visit (INDEPENDENT_AMBULATORY_CARE_PROVIDER_SITE_OTHER): Admitting: Nurse Practitioner

## 2024-09-30 ENCOUNTER — Encounter (INDEPENDENT_AMBULATORY_CARE_PROVIDER_SITE_OTHER): Payer: Self-pay | Admitting: Nurse Practitioner

## 2024-09-30 VITALS — BP 129/76 | HR 88 | Resp 17 | Ht 69.0 in | Wt 192.0 lb

## 2024-09-30 DIAGNOSIS — I714 Abdominal aortic aneurysm, without rupture, unspecified: Secondary | ICD-10-CM | POA: Diagnosis not present

## 2024-09-30 DIAGNOSIS — I890 Lymphedema, not elsewhere classified: Secondary | ICD-10-CM

## 2024-09-30 DIAGNOSIS — E1169 Type 2 diabetes mellitus with other specified complication: Secondary | ICD-10-CM

## 2024-10-03 ENCOUNTER — Encounter (INDEPENDENT_AMBULATORY_CARE_PROVIDER_SITE_OTHER): Payer: Self-pay | Admitting: Nurse Practitioner

## 2024-10-03 NOTE — Progress Notes (Signed)
 "  Subjective:    Patient ID: Sharon [name], female    DOB: 20-Feb-1946, 78 y.o.   MRN: [id number] Chief Complaint  Patient presents with   Follow-up     6 months no studies     HPI  Discussed the use of AI scribe software for clinical note transcription with the patient, who gave verbal consent to proceed.  History of Present Illness Sharon [name] is a 78 year old female with chronic lower extremity lymphedema who presents for follow-up regarding persistent leg swelling and management of compression therapy.  She has persistent and severe lower extremity edema, which has worsened over the past several months. She utilizes a pneumatic compression device at home but has difficulty operating it independently due to improper fitting and complexity, requiring caregiver assistance. Swelling is exacerbated when unable to use the device properly. Recent adjustments to the device, including changes in pressure and fit, resulted in significant improvement in swelling and comfort, with increased diuresis following use. Despite these improvements, she continues to find the equipment cumbersome and difficult to store in her small home.  Approximately one and a half weeks ago, she sustained a large ecchymosis with a palpable lump on her leg after striking it on a box. The area is now erythematous, warm, and swollen. She has a history of easy bruising and is cautious to avoid further trauma. The affected leg remains more swollen than the contralateral side, which is typical for her. She also has neuropathy in her feet and decreased peripheral vision, contributing to increased risk of injury and impaired mobility.  She occasionally experiences a twinge in the right upper quadrant, in the region of her prior cholecystectomy. She denies postprandial abdominal pain, nausea, vomiting, or weight loss. She is able to eat without significant issues, though she avoids certain foods since her gallbladder removal. She  denies constipation and reports regular bowel movements. She has a history of hysterectomy and is unsure if her appendix was removed.  She denies symptoms of arterial insufficiency, including postprandial abdominal pain, significant weight loss, or vomiting. She is aware of a known abdominal aortic aneurysm that is being monitored.    Results     Review of Systems  Cardiovascular:  Positive for leg swelling.  Hematological:  Bruises/bleeds easily.  All other systems reviewed and are negative.      Objective:   Physical Exam Vitals reviewed.  Cardiovascular:     Rate and Rhythm: Normal rate.  Pulmonary:     Effort: Pulmonary effort is normal.  Musculoskeletal:     Left lower leg: Edema present.  Skin:    General: Skin is warm and dry.  Neurological:     Mental Status: She is alert and oriented to person, place, and time.  Psychiatric:        Mood and Affect: Mood normal.        Behavior: Behavior normal.        Thought Content: Thought content normal.        Judgment: Judgment normal.     Physical Exam    BP 129/76   Pulse 88   Resp 17   Ht 5' 9 (1.753 m)   Wt 192 lb (87.1 kg)   LMP  (LMP Unknown)   BMI 28.35 kg/m   Past Medical History:  Diagnosis Date   Aneurysm of descending thoracic aorta    Anxiety    Arthritis    hands, upper back   Asthma  Cervical cancer (HCC)    in situ 1970's   CHF (congestive heart failure) (HCC)    Claustrophobia    COPD (chronic obstructive pulmonary disease) (HCC)    Coronary artery disease    COVID    GERD (gastroesophageal reflux disease)    History of cervical cancer    HLD (hyperlipidemia)    Lymphedema    Menopausal disorder    Neuropathy    Osteoporosis    Pneumonia 1960   Pre-diabetes    PVD (peripheral vascular disease)    Spasm of abdominal muscles of right side    intermittent   Stroke (HCC) 2019   TMJ (dislocation of temporomandibular joint)    Wears dentures    partial lower    Social  History   Socioeconomic History   Marital status: Married    Spouse name: Garielle Mroz   Number of children: 1   Years of education: Not on file   Highest education level: Some college, no degree  Occupational History   Occupation: retired  Tobacco Use   Smoking status: Former    Current packs/day: 0.00    Average packs/day: 0.5 packs/day for 56.0 years (28.0 ttl pk-yrs)    Types: Cigarettes    Start date: 07/14/1960    Quit date: 07/14/2016    Years since quitting: 8.2   Smokeless tobacco: Never  Vaping Use   Vaping status: Never Used  Substance and Sexual Activity   Alcohol use: Yes    Comment: occassional Margarita   Drug use: No   Sexual activity: Not Currently    Birth control/protection: Post-menopausal  Other Topics Concern   Not on file  Social History Narrative   Lives with husband, manages farm   Social Drivers of Health   Tobacco Use: Medium Risk (10/03/2024)   Patient History    Smoking Tobacco Use: Former    Smokeless Tobacco Use: Never    Passive Exposure: Not on Actuary Strain: Low Risk (07/22/2023)   Overall Financial Resource Strain (CARDIA)    Difficulty of Paying Living Expenses: Not hard at all  Food Insecurity: No Food Insecurity (07/22/2023)   Hunger Vital Sign    Worried About Running Out of Food in the Last Year: Never true    Ran Out of Food in the Last Year: Never true  Transportation Needs: No Transportation Needs (07/22/2023)   PRAPARE - Administrator, Civil Service (Medical): No    Lack of Transportation (Non-Medical): No  Physical Activity: Insufficiently Active (07/22/2023)   Exercise Vital Sign    Days of Exercise per Week: 3 days    Minutes of Exercise per Session: 30 min  Stress: No Stress Concern Present (07/22/2023)   [employer] of Occupational Health - Occupational Stress Questionnaire    Feeling of Stress : Only a little  Social Connections: Moderately Isolated (07/22/2023)   Social Connection  and Isolation Panel    Frequency of Communication with Friends and Family: Twice a week    Frequency of Social Gatherings with Friends and Family: Twice a week    Attends Religious Services: Never    Database Administrator or Organizations: No    Attends Banker Meetings: Never    Marital Status: Married  Catering Manager Violence: Not At Risk (07/22/2023)   Humiliation, Afraid, Rape, and Kick questionnaire    Fear of Current or Ex-Partner: No    Emotionally Abused: No    Physically Abused: No  Sexually Abused: No  Depression (PHQ2-9): Low Risk (08/17/2024)   Depression (PHQ2-9)    PHQ-2 Score: 1  Alcohol Screen: Low Risk (07/22/2023)   Alcohol Screen    Last Alcohol Screening Score (AUDIT): 0  Housing: Unknown (11/17/2023)   Received from [facility]   Epic    Unable to Pay for Housing in the Last Year: Not on file    Number of Times Moved in the Last Year: Not on file    At any time in the past 12 months, were you homeless or living in a shelter (including now)?: No  Utilities: Not At Risk (07/22/2023)   [facility] Utilities    Threatened with loss of utilities: No  Health Literacy: Adequate Health Literacy (07/22/2023)   B1300 Health Literacy    Frequency of need for help with medical instructions: Never    Past Surgical History:  Procedure Laterality Date   bladder botox  2005   BLADDER SUSPENSION  2004   CATARACT EXTRACTION W/PHACO Right 03/09/2018   Procedure: CATARACT EXTRACTION PHACO AND INTRAOCULAR LENS PLACEMENT (IOC) right;  Surgeon: Myrna Adine Anes, MD;  Location[facility];  Service: Ophthalmology;  Laterality: Right;  CALL CELL 1ST   CATARACT EXTRACTION W/PHACO Left 04/19/2018   Procedure: CATARACT EXTRACTION PHACO AND INTRAOCULAR LENS PLACEMENT (IOC)  LEFT;  Surgeon: Myrna Adine Anes, MD;  Location: [facility];  Service: Ophthalmology;  Laterality: Left;   CHOLECYSTECTOMY, LAPAROSCOPIC  02/18/2023   COLONOSCOPY WITH  PROPOFOL  N/A 12/13/2015   Procedure: COLONOSCOPY WITH PROPOFOL ;  Surgeon: Rogelia Copping, MD;  Location: [facility];  Service: Endoscopy;  Laterality: N/A;   COLONOSCOPY WITH PROPOFOL  N/A 03/14/2021   Procedure: COLONOSCOPY WITH PROPOFOL ;  Surgeon: Copping Rogelia, MD;  Location: [facility];  Service: Endoscopy;  Laterality: N/A;  diabetic   LOOP RECORDER INSERTION N/A 01/07/2018   Procedure: LOOP RECORDER INSERTION;  Surgeon: Fernande Elspeth BROCKS, MD;  Location: [facility] INVASIVE CV LAB;  Service: Cardiovascular;  Laterality: N/A;   LOOP RECORDER REMOVAL     POLYPECTOMY N/A 12/13/2015   Procedure: POLYPECTOMY;  Surgeon: Rogelia Copping, MD;  Location: [facility];  Service: Endoscopy;  Laterality: N/A;  SIGMOID COLON POLYPS X  5   POLYPECTOMY N/A 03/14/2021   Procedure: POLYPECTOMY;  Surgeon: Copping Rogelia, MD;  Location: [facility];  Service: Endoscopy;  Laterality: N/A;   SHOULDER ARTHROSCOPY W/ ROTATOR CUFF REPAIR Right 1998   TEE WITHOUT CARDIOVERSION N/A 01/06/2018   Procedure: TRANSESOPHAGEAL ECHOCARDIOGRAM (TEE);  Surgeon: Perla Evalene PARAS, MD;  Location: [facility] ORS;  Service: Cardiovascular;  Laterality: N/A;   TONSILLECTOMY AND ADENOIDECTOMY     TOOTH EXTRACTION     July 2023   VAGINAL HYSTERECTOMY  1970's    Family History  Problem Relation Age of Onset   Diabetes Mother    Heart disease Mother    Stroke Mother    Stroke Maternal Grandmother    Hyperlipidemia Neg Hx     Allergies[1]     Latest Ref Rng & Units 08/17/2024    2:22 PM 03/29/2024   11:50 AM 11/13/2023    2:39 PM  CBC  WBC 3.4 - 10.8 x10E3/uL 7.6  9.3  6.7   Hemoglobin 11.1 - 15.9 g/dL 85.9  85.8  86.1   Hematocrit 34.0 - 46.6 % 43.1  44.1  41.3   Platelets 150 - 450 x10E3/uL 216  219  191       CMP     Component  Value Date/Time   NA 139 08/17/2024 1422   K 4.2 08/17/2024 1422   CL 104 08/17/2024 1422   CO2 22 08/17/2024 1422   GLUCOSE 113 (H) 08/17/2024 1422   GLUCOSE 127 (H)  12/15/2022 1119   BUN 17 08/17/2024 1422   CREATININE 1.08 (H) 08/17/2024 1422   CALCIUM  9.5 08/17/2024 1422   PROT 6.4 08/17/2024 1422   ALBUMIN 4.4 08/17/2024 1422   AST 18 08/17/2024 1422   ALT 13 08/17/2024 1422   ALKPHOS 88 08/17/2024 1422   BILITOT 0.2 08/17/2024 1422   EGFR 53 (L) 08/17/2024 1422   GFRNONAA >60 12/15/2022 1119     No results found.     Assessment & Plan:   1. Abdominal aortic aneurysm (AAA) without rupture, unspecified part (Primary) Abdominal aortic aneurysm without rupture Abdominal aortic aneurysm under surveillance. No symptoms of expansion or vascular compromise. Mild abdominal twinge not consistent with aneurysm pathology. - Ordered follow-up imaging in six months. - Reassured regarding absence of concerning symptoms and stability. - Provided education on silent nature of aneurysms and warning signs. - VAS US  AAA DUPLEX; Future  2. Lymphedema Lymphedema Chronic lower extremity lymphedema with recent exacerbation. Concerns for cellulitis versus resolving hematoma due to persistent pain, bruising, erythema, and warmth. Equipment logistics and home environment challenge effective therapy. - Contacted equipment provider for alternative compression pump (Nimble) with abdominal attachment. - Instructed to continue compression therapy with adjusted settings. - Advised to monitor for infection signs and report if symptoms persist or worsen. - Discussed empiric antibiotics if infection is suspected; reassess if no improvement. - Provided education on hematoma risk and limb protection. - Arranged equipment provider follow-up for alternative pump options.  3. Type 2 diabetes mellitus with morbid obesity (HCC) Continue hypoglycemic medications as already ordered, these medications have been reviewed and there are no changes at this time.  Hgb A1C to be monitored as already arranged by primary service   Assessment and Plan Assessment &  Plan        Medications Ordered Prior to Encounter[2]  There are no Patient Instructions on file for this visit. Return in about 6 months (around 03/31/2025) for AAA see FB.   Orvin FORBES Daring, NP      [1]  Allergies Allergen Reactions   Statins Other (See Comments)    Myalgias and joint pain on atorvastatin  40mg  and rosuvastatin  20mg    Dermacerin [Aquaphilic]    Elidel  [Pimecrolimus ]     Patient reports allergy   Nsaids    Pineapple Itching    throat   Praluent  [Alirocumab ]     Flu like symptoms, leg swelling, rash   Quinolones     FLUOROQUINOLONES - Pt reports she was told to NEVER take these.   Baclofen  Swelling   Bactrim  [Sulfamethoxazole -Trimethoprim ] Nausea And Vomiting   Ibuprofen Itching and Rash    Mouth swelling and itching   Lanolin Rash    Allergy to triamcinolone     Latex Rash    Some bandaids, some gloves; BLOOD TEST NEGATIVE   Librium [Chlordiazepoxide] Itching    Dizziness    Naprosyn [Naproxen] Rash    Mouth swelling and swelling   Other Rash    Brazil nuts - mouth swelling  [2]  Current Outpatient Medications on File Prior to Visit  Medication Sig Dispense Refill   acetaminophen  (TYLENOL ) 500 MG tablet Take 500 mg by mouth every 6 (six) hours as needed.     albuterol  (VENTOLIN  HFA) 108 (90 Base) MCG/ACT inhaler INHALE  2 PUFFS BY MOUTH EVERY 6 HOURS AS NEEDED FOR WHEEZING OR SHORTNESS OF BREATH 8.5 g 2   aspirin  EC 81 MG tablet Take 81 mg by mouth 3 (three) times a week. Swallow whole. M-W-Fr     Blood Glucose Monitoring Suppl (ONETOUCH VERIO) w/Device KIT Utilize to check blood sugar twice a day, fasting in morning with goal < 130 and then 2 hours after a meal with goal <180.  Document and bring to visits. 1 kit 0   BREZTRI  AEROSPHERE 160-9-4.8 MCG/ACT AERO inhaler INHALE 2 PUFFS BY MOUTH TWICE DAILY AS DIRECTED 10.7 g 11   Cholecalciferol  (VITAMIN D3) 50 MCG (2000 UT) CAPS Take 1 capsule by mouth daily.     citalopram  (CELEXA ) 20 MG tablet  Take 1 tablet (20 mg total) by mouth as needed. 90 tablet 4   clopidogrel  (PLAVIX ) 75 MG tablet Take 1 tablet (75 mg total) by mouth daily. 90 tablet 3   Cyanocobalamin  1000 MCG/ML KIT Inject 1mL into the muscle once a month 3 kit 3   desoximetasone  (TOPICORT ) 0.05 % cream Apply qd/bid daily to affected areas of lower legs for  2 weeks. Can use for a max of 4 weeks if rash has not improved at 2 weeks Avoid applying to face, groin, and axilla. Use as directed. 100 g 0   EUCRISA  2 % OINT APPLY 1 APPLICATION TOPICALLY DAILY 100 g 2   ezetimibe  (ZETIA ) 10 MG tablet Take 1 tablet (10 mg total) by mouth daily. 90 tablet 4   furosemide  (LASIX ) 20 MG tablet Take 0.5 tablets (10 mg total) by mouth daily as needed. 90 tablet 4   gabapentin (NEURONTIN) 100 MG tablet Take 100 mg by mouth 2 (two) times daily. (Patient taking differently: Take 100 mg by mouth as needed.)     glucose blood (ONETOUCH VERIO) test strip UTILIIZE TO CHECK BLOOD SUGAR TWICE DAILY FASTING IN MORNING WITH GOAL<130 AND THAN 2 HOURS AFTER A MEAL WITH GGOAL<180 100 strip 3   Glycerin-Polysorbate 80 (REFRESH DRY EYE THERAPY OP) Apply to eye. (Patient taking differently: Apply to eye as needed.)     Lancets (ONETOUCH ULTRASOFT) lancets Utilize to check blood sugar twice a day, fasting in morning with goal < 130 and then 2 hours after a meal with goal <180.  Document and bring to visits. 100 each 12   loratadine  (CLARITIN ) 10 MG tablet Take 1 tablet (10 mg total) by mouth daily. 30 tablet 11   meclizine  (ANTIVERT ) 12.5 MG tablet Take 12.5 mg by mouth 3 (three) times daily.     pantoprazole  (PROTONIX ) 40 MG tablet TAKE 1 TABLET(40 MG) BY MOUTH DAILY 90 tablet 4   rosuvastatin  (CRESTOR ) 5 MG tablet TAKE 1 TABLET(5 MG) BY MOUTH DAILY 90 tablet 2   Ruxolitinib Phosphate  (OPZELURA ) 1.5 % CREA Apply topically to legs twice daily for dermatitis 60 g 6   Syringe/Needle, Disp, 22G X 1-1/2 1 ML MISC Use to inject b12 monthly 3 each 3   No current  facility-administered medications on file prior to visit.   "

## 2024-10-04 ENCOUNTER — Encounter: Payer: Self-pay | Admitting: Nurse Practitioner

## 2024-10-04 ENCOUNTER — Ambulatory Visit
Admission: RE | Admit: 2024-10-04 | Discharge: 2024-10-04 | Disposition: A | Discharge status: Home / Self Care | Source: Ambulatory Visit | Attending: Physician Assistant | Admitting: Physician Assistant

## 2024-10-04 ENCOUNTER — Ambulatory Visit: Payer: Self-pay | Admitting: Pulmonary Disease

## 2024-10-04 DIAGNOSIS — I714 Abdominal aortic aneurysm, without rupture, unspecified: Secondary | ICD-10-CM | POA: Insufficient documentation

## 2024-10-04 DIAGNOSIS — I77819 Aortic ectasia, unspecified site: Secondary | ICD-10-CM | POA: Insufficient documentation

## 2024-10-04 NOTE — Telephone Encounter (Signed)
 Needs to COVID test. Recommend mucinex  OTC, flonase nasal spray 2 sprays each nostril daily, and saline nasal rinses 1-2 times a day. Given she is not having any shortness of breath, wheezing, etc, would hold off on abx as this is likely viral in nature. UC for worsening symptoms.

## 2024-10-04 NOTE — Telephone Encounter (Signed)
 FYI Only or Action Required?: Action required by provider: clinical question for provider, update on patient condition, and requesting recommendations from provider for her symptoms.  Patient is followed in Pulmonology for COPD, last seen on 08/23/2024 by Tamea Dedra CROME, MD.  Called Nurse Triage reporting Cough.  Symptoms began today.  Interventions attempted: Rescue inhaler, Maintenance inhaler, and Increased fluids/rest.  Symptoms are: unchanged.  Triage Disposition: See Physician Within 24 Hours  Patient/caregiver understands and will follow disposition?: No, wishes to speak with PCP  E2C2 Pulmonary Triage - Initial Assessment Questions Chief Complaint (e.g., cough, sob, wheezing, fever, chills, sweat or additional symptoms) *Go to specific symptom protocol after initial questions. Patient reports productive cough with green sputum that started this morning. Endorses coughing up green chunks. Reports she was sneezing yesterday but no coughing. Patient denies shortness of breath, wheezing, fever, chills, sweats or chest pain. Good use of inhalers.  Patient is requesting recommendations from provider. Patient is running errands with her husband today-asking to leave a detailed message if she is unable to answer.  How long have symptoms been present? Started this morning  Have you tested for COVID or Flu? Note: If not, ask patient if a home test can be taken. If so, instruct patient to call back for positive results. No  MEDICINES:   Have you used any OTC meds to help with symptoms? No If yes, ask What medications?   Have you used your inhalers/maintenance medication? Yes If yes, What medications? Albuterol  Breztri   If inhaler, ask How many puffs and how often? Note: Review instructions on medication in the chart. Albuterol  2 puffs Q6H PRN Breztri  2 Puffs BID  OXYGEN: Do you wear supplemental oxygen? No If yes, How many liters are you supposed to  use?   Do you monitor your oxygen levels? Yes If yes, What is your reading (oxygen level) today? 95%  What is your usual oxygen saturation reading?  (Note: Pulmonary O2 sats should be 90% or greater) 93-95%   Copied from CRM #[id number]. Topic: Clinical - Red Word Triage >> Oct 04, 2024  9:12 AM Isabell A wrote: Red Word that prompted transfer to Nurse Triage:  Patient states she coughing up green phlegm , she has a cold - was told to call in if she needed us . Reason for Disposition  SEVERE coughing spells (e.g., whooping sound after coughing, vomiting after coughing)  Answer Assessment - Initial Assessment Questions 7. CARDIAC HISTORY: Do you have any history of heart disease? (e.g., heart attack, congestive heart failure)      no 8. LUNG HISTORY: Do you have any history of lung disease?  (e.g., pulmonary embolus, asthma, emphysema)     yes 12. TRAVEL: Have you traveled out of the country in the last month? (e.g., travel history, exposures)       no  Protocols used: Cough - Acute Productive-A-AH

## 2024-10-04 NOTE — Telephone Encounter (Signed)
 NA. LMTCB. Sent fpl group.

## 2024-10-05 ENCOUNTER — Ambulatory Visit

## 2024-10-05 ENCOUNTER — Ambulatory Visit: Payer: Self-pay

## 2024-10-05 VITALS — BP 125/56 | HR 80 | Temp 98.1°F | Resp 15 | Ht 69.02 in | Wt 193.0 lb

## 2024-10-05 DIAGNOSIS — R051 Acute cough: Secondary | ICD-10-CM

## 2024-10-05 DIAGNOSIS — J449 Chronic obstructive pulmonary disease, unspecified: Secondary | ICD-10-CM | POA: Diagnosis not present

## 2024-10-05 DIAGNOSIS — J069 Acute upper respiratory infection, unspecified: Secondary | ICD-10-CM

## 2024-10-05 DIAGNOSIS — J029 Acute pharyngitis, unspecified: Secondary | ICD-10-CM

## 2024-10-05 LAB — POC COVID19 BINAXNOW: SARS Coronavirus 2 Ag: NEGATIVE

## 2024-10-05 MED ORDER — PREDNISONE 20 MG PO TABS: 40.0000 mg | ORAL_TABLET | Freq: Every day | ORAL | 0 refills | Status: AC

## 2024-10-05 NOTE — Progress Notes (Signed)
 "  BP (!) 125/56 (BP Location: Left Arm, Patient Position: Sitting, Cuff Size: Normal)   Pulse 80   Temp 98.1 F (36.7 C) (Oral)   Resp 15   Ht 5' 9.02 (1.753 m)   Wt 193 lb (87.5 kg)   LMP  (LMP Unknown)   SpO2 98%   BMI 28.49 kg/m    Subjective:    Patient ID: Sharon [name], female    DOB: 22-Oct-1945, 78 y.o.   MRN: [id number]  HPI: ADALYNN CORNE is a 78 y.o. female complaining of sore throat, cough, congestion, fever tmax 100, chills, onset 1 day ago.  She reports coughing up green phlegm. Denies wheezing. She is taking tylenol , gargling vinegar and drinking water  as she is able.  Last dose tylenol  this am.  Hx of COPD taking Breztri  and albuterol  normally - not having to take albuterol  more the last 2 days.  Pharmacy - [facility]  Chief Complaint  Patient presents with   URI    Started yesterday morning.     Relevant past medical, surgical, family and social history reviewed and updated as indicated. Interim medical history since our last visit reviewed. Allergies and medications reviewed and updated.  Review of Systems  Constitutional:  Positive for appetite change, chills, diaphoresis, fatigue and fever.  HENT:  Positive for postnasal drip, rhinorrhea, sinus pressure, sinus pain and sore throat. Negative for ear discharge and ear pain.   Eyes:  Negative for pain.  Respiratory:  Positive for cough and choking. Negative for shortness of breath and wheezing.   Cardiovascular:  Negative for chest pain and palpitations.  Gastrointestinal:  Negative for abdominal pain, diarrhea and nausea.  Musculoskeletal:  Positive for myalgias.  Neurological:  Positive for headaches.    Per HPI unless specifically indicated above     Objective:    BP (!) 125/56 (BP Location: Left Arm, Patient Position: Sitting, Cuff Size: Normal)   Pulse 80   Temp 98.1 F (36.7 C) (Oral)   Resp 15   Ht 5' 9.02 (1.753 m)   Wt 193 lb (87.5 kg)   LMP  (LMP Unknown)   SpO2 98%   BMI 28.49  kg/m   Wt Readings from Last 3 Encounters:  10/05/24 193 lb (87.5 kg)  09/30/24 192 lb (87.1 kg)  09/29/24 193 lb (87.5 kg)    Physical Exam Constitutional:      Appearance: Normal appearance. She is obese.  HENT:     Head: Normocephalic and atraumatic.     Right Ear: Tympanic membrane normal.     Left Ear: Tympanic membrane normal.     Nose: Congestion present.     Mouth/Throat:     Mouth: Mucous membranes are moist.     Pharynx: Posterior oropharyngeal erythema present. No oropharyngeal exudate.  Eyes:     Conjunctiva/sclera: Conjunctivae normal.  Cardiovascular:     Rate and Rhythm: Normal rate and regular rhythm.     Pulses: Normal pulses.     Heart sounds: Normal heart sounds.  Pulmonary:     Effort: Pulmonary effort is normal.     Breath sounds: Normal breath sounds.  Musculoskeletal:     Cervical back: Normal range of motion and neck supple.  Skin:    General: Skin is warm and dry.  Neurological:     Mental Status: She is alert and oriented to person, place, and time.     Results for orders placed or performed in visit on 10/05/24  POC COVID-19  Collection Time: 10/05/24 10:35 AM  Result Value Ref Range   SARS Coronavirus 2 Ag Negative Negative   *Note: Due to a large number of results and/or encounters for the requested time period, some results have not been displayed. A complete set of results can be found in Results Review.      Assessment & Plan:   Assessment & Plan URI with cough and congestion URI with coughing, congestion, and hx of COPD. Lungs CTA, will send steroid burst and follow up in 5-7 days for recheck/reevaluate if COPD exac develops- antibiotics pending reassessment.  Recommended to continue with supportive treatments: continue taking tylenol  q4-6h for fever and chills, mucinex  OTC for cough and congestion as directed.  Orders:   predniSONE  (DELTASONE ) 20 MG tablet; Take 2 tablets (40 mg total) by mouth daily for 5 days.  Sore  throat Flu/Covid and strep negative. Orders:   Veritor Flu A/B Waived   Rapid Strep Screen ([facility] ONLY)   POC COVID-19  Acute cough  Orders:   Veritor Flu A/B Waived   Rapid Strep Screen ([facility] ONLY)   POC COVID-19   Follow up plan: Return in about 5 days (around 10/10/2024), or 5-7 days for recheck -afternoon preferrably with me or Jolene, NP.      "

## 2024-10-05 NOTE — Telephone Encounter (Signed)
 Scheduled for this morning with Jenn Bailey.

## 2024-10-08 LAB — RAPID STREP SCREEN (MED CTR MEBANE ONLY): Strep Gp A Ag, IA W/Reflex: NEGATIVE

## 2024-10-08 LAB — VERITOR FLU A/B WAIVED
Influenza A: NEGATIVE
Influenza B: NEGATIVE

## 2024-10-08 LAB — CULTURE, GROUP A STREP: Strep A Culture: NEGATIVE

## 2024-10-10 MED ORDER — AMOXICILLIN-POT CLAVULANATE 875-125 MG PO TABS: 1.0000 | ORAL_TABLET | Freq: Two times a day (BID) | ORAL | 0 refills | Status: AC

## 2024-10-10 NOTE — Addendum Note (Signed)
 Addended by: Kenji Mapel T on: 10/10/2024 12:06 PM   Modules accepted: Orders

## 2024-10-15 NOTE — Patient Instructions (Signed)
 COPD Action Plan A COPD action plan is a description of what to do when you have a flare (exacerbation) of chronic obstructive pulmonary disease (COPD). Your action plan is a color-coded plan that lists the symptoms that indicate whether your condition is under control and what actions to take. If you have symptoms in the green zone, it means you are doing well that day. If you have symptoms in the yellow zone, it means you are having a bad day or an exacerbation. If you have symptoms in the red zone, you need urgent medical care. Follow the plan that you and your health care provider developed. Review your plan with your health care provider at each visit. Red zone Symptoms in this zone mean that you should get medical help right away. They include: Feeling very short of breath, even when you are resting. Not being able to do any activities because of poor breathing. Not being able to sleep because of poor breathing. Fever or shaking chills. Feeling confused or very sleepy. Chest pain. Coughing up blood. If you have any of these symptoms, call emergency services (911 in the [place] or go to the nearest emergency room. Yellow zone Symptoms in this zone mean that your condition may be getting worse. They include: Feeling more short of breath than usual. Having less energy for daily activities than usual. Phlegm or mucus that is thicker than usual. Needing to use your rescue inhaler or nebulizer more often than usual. More ankle swelling than usual. Coughing more than usual. Feeling like you have a chest cold. Trouble sleeping due to COPD symptoms. Decreased appetite. COPD medicines not helping as much as usual. If you experience any yellow symptoms: Keep taking your daily medicines as directed. Use your quick-relief inhaler as told by your health care provider. If you were prescribed steroid medicine to take by mouth (oral medicine), start taking it as told by your health care  provider. If you were prescribed an antibiotic medicine, start taking it as told by your health care provider. Do not stop taking the antibiotic even if you start to feel better. Use oxygen as told by your health care provider. Get more rest. Do your pursed-lip breathing exercises. Do not smoke. Avoid any irritants in the air. If your signs and symptoms do not improve after taking these steps, call your health care provider right away. Green zone Symptoms in this zone mean that you are doing well. They include: Being able to do your usual activities and exercise. Having the usual amount of coughing, including the same amount of phlegm or mucus. Being able to sleep well. Having a good appetite. Where to find more information: You can find more information about COPD from: American Lung Association, My COPD Action Plan: www.lung.org COPD Foundation: www.copdfoundation.org National Heart, Lung, & Blood Institute: popsteam.is Follow these instructions at home: Continue taking your daily medicines as told by your health care provider. Make sure you receive all the immunizations that your health care provider recommends, especially the pneumococcal and influenza vaccines. Wash your hands often with soap and water . Have family members wash their hands too. Regular hand washing can help prevent infections. Follow your usual exercise and diet plan. Avoid irritants in the air, such as smoke. Do not use any products that contain nicotine or tobacco. These products include cigarettes, chewing tobacco, and vaping devices, such as e-cigarettes. If you need help quitting, ask your health care provider. Summary A COPD action plan tells you what to do when  you have a flare (exacerbation) of chronic obstructive pulmonary disease (COPD). Follow each action plan for your symptoms. If you have any symptoms in the red zone, call emergency services (911 in the [place] or go to the nearest emergency  room. This information is not intended to replace advice given to you by your health care provider. Make sure you discuss any questions you have with your health care provider. Document Revised: 08/13/2023 Document Reviewed: 08/13/2023 Elsevier Patient Education  2024 [employer].

## 2024-10-18 ENCOUNTER — Ambulatory Visit: Admitting: Dermatology

## 2024-10-18 ENCOUNTER — Ambulatory Visit: Admitting: Nurse Practitioner

## 2024-10-18 ENCOUNTER — Encounter: Payer: Self-pay | Admitting: Nurse Practitioner

## 2024-10-18 VITALS — BP 126/73 | HR 73 | Temp 97.7°F | Resp 18 | Ht 69.02 in | Wt 191.0 lb

## 2024-10-18 DIAGNOSIS — J432 Centrilobular emphysema: Secondary | ICD-10-CM | POA: Diagnosis not present

## 2024-10-18 DIAGNOSIS — R399 Unspecified symptoms and signs involving the genitourinary system: Secondary | ICD-10-CM | POA: Diagnosis not present

## 2024-10-18 LAB — URINALYSIS, ROUTINE W REFLEX MICROSCOPIC
Glucose, UA: NEGATIVE
Ketones, UA: NEGATIVE
Leukocytes,UA: NEGATIVE
Nitrite, UA: NEGATIVE
Protein,UA: NEGATIVE
RBC, UA: NEGATIVE
Specific Gravity, UA: 1.025 (ref 1.005–1.030)
Urobilinogen, Ur: 0.2 mg/dL (ref 0.2–1.0)
pH, UA: 5.5 (ref 5.0–7.5)

## 2024-10-18 MED ORDER — LORATADINE 10 MG PO TABS: 10.0000 mg | ORAL_TABLET | Freq: Every day | ORAL | 11 refills | Status: AC

## 2024-10-18 NOTE — Assessment & Plan Note (Signed)
 Chronic, acute exacerbation now improved. Continue Breztri , for triple therapy, is tolerating and Albuterol  as needed. Continue to collaborate with pulmonary. Will continue to collaborate with [facility] pharmacist for assistance on medications. We discussed adding Singulair  due to underlying allergies, educated on this and BLACK BOX warning.  She prefers not to start.

## 2024-10-18 NOTE — Progress Notes (Signed)
 "  BP 126/73 (BP Location: Left Arm, Patient Position: Sitting, Cuff Size: Normal)   Pulse 73   Temp 97.7 F (36.5 C) (Oral)   Resp 18   Ht 5' 9.02 (1.753 m)   Wt 191 lb (86.6 kg)   LMP  (LMP Unknown)   SpO2 96%   BMI 28.19 kg/m    Subjective:    Patient ID: Sharon [name], female    DOB: [birth year], 78 y.o.   MRN: [id number]  HPI: Sharon [name] is a 79 y.o. female  Chief Complaint  Patient presents with   Follow-up    Follow up from flu on 10/04/24. Still coughing up thick green phlegm. Sinuses are all over the place and chest still hurts when coughing.   Urinary Tract Infection    Possible UTI? Discomfort when going to bathroom and urine has been dark   COPD Uses Breztri  daily and Albuterol  PRN.  In past took Stiolto and Symbicort  without benefit. Last visit with pulmonary was on 08/23/24. Was seen on 10/05/24 for URI symptoms and treated with Prednisone  and Augmentin . She reports cough is improving, but still some green phlegm present. Has completed treatment. .   Last lung screening was 08/03/23 noting mild centrilobular emphysema and aortic atherosclerosis.  She has aged out of this program.  COPD status: stable Satisfied with current treatment?: yes Oxygen use: no Dyspnea frequency: occasional if coughs a lot Cough frequency: improving Rescue inhaler frequency:  used more often while sick Limitation of activity: no Productive cough: yes Last Spirometry: with pulmonary Pneumovax: Up to Date Influenza: Up to Date  URINARY SYMPTOMS Urine dark recently. Dysuria: it did initially Urinary frequency: no Urgency: no Small volume voids: no Symptom severity: yes Urinary incontinence: no Foul odor: no Hematuria: no Abdominal pain: no Back pain: no Suprapubic pain/pressure: yes Flank pain: no Fever:  no Vomiting: no Status: stable Previous urinary tract infection: yes Recurrent urinary tract infection: no Sexual activity: No sexually active/monogomous History of  sexually transmitted disease: no Treatments attempted: increasing fluids, vinegar/water  mix -- recently had abx    Relevant past medical, surgical, family and social history reviewed and updated as indicated. Interim medical history since our last visit reviewed. Allergies and medications reviewed and updated.  Review of Systems  Constitutional:  Negative for activity change, appetite change, diaphoresis, fatigue and fever.  Respiratory:  Positive for cough (improving) and shortness of breath (improved). Negative for chest tightness and wheezing.   Cardiovascular:  Negative for chest pain, palpitations and leg swelling.  Gastrointestinal: Negative.   Genitourinary:  Positive for dysuria. Negative for decreased urine volume, difficulty urinating, frequency, hematuria and urgency.  Neurological: Negative.   Psychiatric/Behavioral: Negative.      Per HPI unless specifically indicated above     Objective:    BP 126/73 (BP Location: Left Arm, Patient Position: Sitting, Cuff Size: Normal)   Pulse 73   Temp 97.7 F (36.5 C) (Oral)   Resp 18   Ht 5' 9.02 (1.753 m)   Wt 191 lb (86.6 kg)   LMP  (LMP Unknown)   SpO2 96%   BMI 28.19 kg/m   Wt Readings from Last 3 Encounters:  10/18/24 191 lb (86.6 kg)  10/05/24 193 lb (87.5 kg)  09/30/24 192 lb (87.1 kg)    Physical Exam Vitals and nursing note reviewed.  Constitutional:      General: She is awake. She is not in acute distress.    Appearance: She is well-developed and well-groomed. She  is not ill-appearing or toxic-appearing.  HENT:     Head: Normocephalic.     Right Ear: Hearing, tympanic membrane, ear canal and external ear normal.     Left Ear: Hearing, tympanic membrane, ear canal and external ear normal.     Nose: Nose normal.     Right Sinus: No maxillary sinus tenderness or frontal sinus tenderness.     Left Sinus: No maxillary sinus tenderness or frontal sinus tenderness.     Mouth/Throat:     Mouth: Mucous membranes  are moist.     Pharynx: Postnasal drip present. No pharyngeal swelling, oropharyngeal exudate or posterior oropharyngeal erythema.  Eyes:     General: Lids are normal.        Right eye: No discharge.        Left eye: No discharge.     Conjunctiva/sclera: Conjunctivae normal.     Pupils: Pupils are equal, round, and reactive to light.  Neck:     Thyroid : No thyromegaly.     Vascular: No carotid bruit.  Cardiovascular:     Rate and Rhythm: Normal rate and regular rhythm.     Heart sounds: Normal heart sounds. No murmur heard.    No gallop.  Pulmonary:     Effort: Pulmonary effort is normal. No accessory muscle usage or respiratory distress.     Breath sounds: Normal breath sounds. No decreased breath sounds, wheezing or rales.  Abdominal:     General: Bowel sounds are normal. There is no distension.     Palpations: Abdomen is soft.     Tenderness: There is no abdominal tenderness.  Musculoskeletal:     Cervical back: Normal range of motion and neck supple.     Right lower leg: No edema.     Left lower leg: No edema.  Lymphadenopathy:     Cervical: No cervical adenopathy.  Skin:    General: Skin is warm and dry.  Neurological:     Mental Status: She is alert and oriented to person, place, and time.     Deep Tendon Reflexes: Reflexes are normal and symmetric.     Reflex Scores:      Brachioradialis reflexes are 2+ on the right side and 2+ on the left side.      Patellar reflexes are 2+ on the right side and 2+ on the left side. Psychiatric:        Attention and Perception: Attention normal.        Mood and Affect: Mood normal.        Speech: Speech normal.        Behavior: Behavior normal. Behavior is cooperative.        Thought Content: Thought content normal.     Results for orders placed or performed in visit on 10/05/24  Rapid Strep Screen ([facility] ONLY)   Collection Time: 10/05/24 10:32 AM   Specimen: Nasopharyngeal   Naso  Release to patie  Result Value Ref  Range   Strep Gp A Ag, IA W/Reflex Negative Negative  Culture, Group A Strep   Collection Time: 10/05/24 10:32 AM   Naso  Release to patie  Result Value Ref Range   Strep A Culture Negative   Veritor Flu A/B Waived   Collection Time: 10/05/24 10:32 AM  Result Value Ref Range   Influenza A Negative Negative   Influenza B Negative Negative  POC COVID-19   Collection Time: 10/05/24 10:35 AM  Result Value Ref Range   SARS Coronavirus  2 Ag Negative Negative   *Note: Due to a large number of results and/or encounters for the requested time period, some results have not been displayed. A complete set of results can be found in Results Review.      Assessment & Plan:   Problem List Items Addressed This Visit       Respiratory   Centrilobular emphysema (HCC) - Primary   Chronic, acute exacerbation now improved. Continue Breztri , for triple therapy, is tolerating and Albuterol  as needed. Continue to collaborate with pulmonary. Will continue to collaborate with [facility] pharmacist for assistance on medications. We discussed adding Singulair  due to underlying allergies, educated on this and BLACK BOX warning.  She prefers not to start.      Relevant Medications   loratadine  (CLARITIN ) 10 MG tablet   Other Visit Diagnoses       Urinary symptom or sign       UA overall reassuring.   Relevant Orders   Urinalysis, Routine w reflex microscopic        Follow up plan: Return for as scheduled February 6th.      "

## 2024-10-21 ENCOUNTER — Encounter: Payer: Self-pay | Admitting: Nurse Practitioner

## 2024-10-21 ENCOUNTER — Ambulatory Visit: Payer: Self-pay | Admitting: Physician Assistant

## 2024-10-21 DIAGNOSIS — R911 Solitary pulmonary nodule: Secondary | ICD-10-CM | POA: Insufficient documentation

## 2024-10-21 DIAGNOSIS — Z87898 Personal history of other specified conditions: Secondary | ICD-10-CM

## 2024-10-26 ENCOUNTER — Encounter: Payer: Self-pay | Admitting: Nurse Practitioner

## 2024-10-27 ENCOUNTER — Ambulatory Visit: Admitting: Emergency Medicine

## 2024-10-27 VITALS — Ht 68.5 in | Wt 187.0 lb

## 2024-10-27 DIAGNOSIS — Z780 Asymptomatic menopausal state: Secondary | ICD-10-CM

## 2024-10-27 DIAGNOSIS — Z0000 Encounter for general adult medical examination without abnormal findings: Secondary | ICD-10-CM

## 2024-10-27 NOTE — Progress Notes (Signed)
 "  Chief Complaint  Patient presents with   Medicare Wellness     Subjective:   Sharon [name] is a 79 y.o. female who presents for a Medicare Annual Wellness Visit.  Visit info / Clinical Intake: Medicare Wellness Visit Type:: Subsequent Annual Wellness Visit Persons participating in visit and providing information:: patient Medicare Wellness Visit Mode:: Telephone If telephone:: video declined Since this visit was completed virtually, some vitals may be partially provided or unavailable. Missing vitals are due to the limitations of the virtual format.: Documented vitals are patient reported If Telephone or Video please confirm:: I connected with patient using audio/video enable telemedicine. I verified patient identity with two identifiers, discussed telehealth limitations, and patient agreed to proceed. Patient Location:: home Provider Location:: clinic office Interpreter Needed?: No Pre-visit prep was completed: yes AWV questionnaire completed by patient prior to visit?: no Living arrangements:: lives with spouse/significant other Patient's Overall Health Status Rating: good Typical amount of pain: (!) a lot Does pain affect daily life?: no Are you currently prescribed opioids?: no  Dietary Habits and Nutritional Risks How many meals a day?: 3 Eats fruit and vegetables daily?: yes Most meals are obtained by: preparing own meals; eating out (50/50) In the last 2 weeks, have you had any of the following?: (!) nausea, vomiting, diarrhea (diarrhea due to gallbladder removal) Diabetic:: (!) yes Any non-healing wounds?: no How often do you check your BS?: as needed Would you like to be referred to a Nutritionist or for Diabetic Management? : no  Functional Status Activities of Daily Living (to include ambulation/medication): Independent Ambulation: Independent with device- listed below Home Assistive Devices/Equipment: Walker (specify Type); Eyeglasses (rolling walker (uses  prn)) Medication Administration: Independent Home Management (perform basic housework or laundry): Independent Manage your own finances?: (!) no (husband manages finances) Primary transportation is: family / friends Concerns about vision?: no *vision screening is required for WTM* Concerns about hearing?: (!) yes (has some hearing loss) Uses hearing aids?: no  Fall Screening Falls in the past year?: 0 Number of falls in past year: 0 Was there an injury with Fall?: 0 Fall Risk Category Calculator: 0 Patient Fall Risk Level: Low Fall Risk  Fall Risk Patient at Risk for Falls Due to: Impaired balance/gait; Impaired mobility Fall risk Follow up: Falls evaluation completed; Education provided  Home and Transportation Safety: All rugs have non-skid backing?: yes All stairs or steps have railings?: yes Grab bars in the bathtub or shower?: yes Have non-skid surface in bathtub or shower?: yes Good home lighting?: yes Regular seat belt use?: yes Hospital stays in the last year:: no  Cognitive Assessment Difficulty concentrating, remembering, or making decisions? : no Will 6CIT or Mini Cog be Completed: yes What year is it?: 0 points What month is it?: 0 points Give patient an address phrase to remember (5 components): [address] [place] About what time is it?: 0 points Count backwards from 20 to 1: 0 points Say the months of the year in reverse: 0 points Repeat the address phrase from earlier: 0 points 6 CIT Score: 0 points  Advance Directives (For Healthcare) Does Patient Have a Medical Advance Directive?: No Would patient like information on creating a medical advance directive?: Yes (MAU/Ambulatory/Procedural Areas - Information given)  Reviewed/Updated  Reviewed/Updated: Reviewed All (Medical, Surgical, Family, Medications, Allergies, Care Teams, Patient Goals)    Allergies (verified) Statins, Dermacerin [aquaphilic], Elidel  [pimecrolimus ], Nsaids, Pineapple,  Praluent  [alirocumab ], Quinolones, Baclofen , Bactrim  [sulfamethoxazole -trimethoprim ], Ibuprofen, Lanolin, Latex, Librium [chlordiazepoxide], Naprosyn [naproxen],  and Other   Current Medications (verified) Outpatient Encounter Medications as of 10/27/2024  Medication Sig   acetaminophen  (TYLENOL ) 500 MG tablet Take 500 mg by mouth every 6 (six) hours as needed.   albuterol  (VENTOLIN  HFA) 108 (90 Base) MCG/ACT inhaler INHALE 2 PUFFS BY MOUTH EVERY 6 HOURS AS NEEDED FOR WHEEZING OR SHORTNESS OF BREATH   aluminum-magnesium hydroxide 200-200 MG/5ML suspension Take by mouth every 6 (six) hours as needed for indigestion.   aspirin  EC 81 MG tablet Take 81 mg by mouth 3 (three) times a week. Swallow whole. M-W-Fr   Blood Glucose Monitoring Suppl (ONETOUCH VERIO) w/Device KIT Utilize to check blood sugar twice a day, fasting in morning with goal < 130 and then 2 hours after a meal with goal <180.  Document and bring to visits.   BREZTRI  AEROSPHERE 160-9-4.8 MCG/ACT AERO inhaler INHALE 2 PUFFS BY MOUTH TWICE DAILY AS DIRECTED   Cholecalciferol  (VITAMIN D3) 50 MCG (2000 UT) CAPS Take 1 capsule by mouth daily.   citalopram  (CELEXA ) 20 MG tablet Take 1 tablet (20 mg total) by mouth as needed.   clopidogrel  (PLAVIX ) 75 MG tablet Take 1 tablet (75 mg total) by mouth daily.   Cyanocobalamin  1000 MCG/ML KIT Inject 1mL into the muscle once a month   EUCRISA  2 % OINT APPLY 1 APPLICATION TOPICALLY DAILY   ezetimibe  (ZETIA ) 10 MG tablet Take 1 tablet (10 mg total) by mouth daily.   furosemide  (LASIX ) 20 MG tablet Take 0.5 tablets (10 mg total) by mouth daily as needed.   gabapentin (NEURONTIN) 100 MG tablet Take 100 mg by mouth 2 (two) times daily. (Patient taking differently: Take 100 mg by mouth 2 (two) times daily. PRN)   glucose blood (ONETOUCH VERIO) test strip UTILIIZE TO CHECK BLOOD SUGAR TWICE DAILY FASTING IN MORNING WITH GOAL<130 AND THAN 2 HOURS AFTER A MEAL WITH GGOAL<180   Glycerin-Polysorbate 80 (REFRESH  DRY EYE THERAPY OP) Apply to eye. (Patient taking differently: Apply to eye. PRN)   Lancets (ONETOUCH ULTRASOFT) lancets Utilize to check blood sugar twice a day, fasting in morning with goal < 130 and then 2 hours after a meal with goal <180.  Document and bring to visits.   loratadine  (CLARITIN ) 10 MG tablet Take 1 tablet (10 mg total) by mouth daily.   meclizine  (ANTIVERT ) 12.5 MG tablet Take 12.5 mg by mouth 3 (three) times daily. (Patient taking differently: Take 12.5 mg by mouth 3 (three) times daily. PRN)   pantoprazole  (PROTONIX ) 40 MG tablet TAKE 1 TABLET(40 MG) BY MOUTH DAILY   rosuvastatin  (CRESTOR ) 5 MG tablet TAKE 1 TABLET(5 MG) BY MOUTH DAILY   Ruxolitinib Phosphate  (OPZELURA ) 1.5 % CREA Apply topically to legs twice daily for dermatitis   Syringe/Needle, Disp, 22G X 1-1/2 1 ML MISC Use to inject b12 monthly   desoximetasone  (TOPICORT ) 0.05 % cream Apply qd/bid daily to affected areas of lower legs for  2 weeks. Can use for a max of 4 weeks if rash has not improved at 2 weeks Avoid applying to face, groin, and axilla. Use as directed. (Patient not taking: Reported on 10/27/2024)   No facility-administered encounter medications on file as of 10/27/2024.    History: Past Medical History:  Diagnosis Date   Aneurysm of descending thoracic aorta    Anxiety    Arthritis    hands, upper back   Asthma    Cervical cancer (HCC)    in situ 1970's   CHF (congestive heart failure) (HCC)  Claustrophobia    COPD (chronic obstructive pulmonary disease) (HCC)    Coronary artery disease    COVID    GERD (gastroesophageal reflux disease)    History of cervical cancer    HLD (hyperlipidemia)    Lymphedema    Menopausal disorder    Neuropathy    Osteoporosis    Pneumonia 1960   Pre-diabetes    PVD (peripheral vascular disease)    Spasm of abdominal muscles of right side    intermittent   Stroke (HCC) 2019   TMJ (dislocation of temporomandibular joint)    Wears dentures    partial  lower   Past Surgical History:  Procedure Laterality Date   bladder botox  2005   BLADDER SUSPENSION  2004   CATARACT EXTRACTION W/PHACO Right 03/09/2018   Procedure: CATARACT EXTRACTION PHACO AND INTRAOCULAR LENS PLACEMENT (IOC) right;  Surgeon: Myrna Adine Anes, MD;  Location: [facility];  Service: Ophthalmology;  Laterality: Right;  CALL CELL 1ST   CATARACT EXTRACTION W/PHACO Left 04/19/2018   Procedure: CATARACT EXTRACTION PHACO AND INTRAOCULAR LENS PLACEMENT (IOC)  LEFT;  Surgeon: Myrna Adine Anes, MD;  Location: [facility];  Service: Ophthalmology;  Laterality: Left;   CHOLECYSTECTOMY, LAPAROSCOPIC  02/18/2023   COLONOSCOPY WITH PROPOFOL  N/A 12/13/2015   Procedure: COLONOSCOPY WITH PROPOFOL ;  Surgeon: Rogelia Copping, MD;  Location: [facility];  Service: Endoscopy;  Laterality: N/A;   COLONOSCOPY WITH PROPOFOL  N/A 03/14/2021   Procedure: COLONOSCOPY WITH PROPOFOL ;  Surgeon: Copping Rogelia, MD;  Location: [facility];  Service: Endoscopy;  Laterality: N/A;  diabetic   LOOP RECORDER INSERTION N/A 01/07/2018   Procedure: LOOP RECORDER INSERTION;  Surgeon: Fernande Elspeth BROCKS, MD;  Location: [facility] INVASIVE CV LAB;  Service: Cardiovascular;  Laterality: N/A;   LOOP RECORDER REMOVAL     POLYPECTOMY N/A 12/13/2015   Procedure: POLYPECTOMY;  Surgeon: Rogelia Copping, MD;  Location: [facility];  Service: Endoscopy;  Laterality: N/A;  SIGMOID COLON POLYPS X  5   POLYPECTOMY N/A 03/14/2021   Procedure: POLYPECTOMY;  Surgeon: Copping Rogelia, MD;  Location: [facility];  Service: Endoscopy;  Laterality: N/A;   SHOULDER ARTHROSCOPY W/ ROTATOR CUFF REPAIR Right 1998   TEE WITHOUT CARDIOVERSION N/A 01/06/2018   Procedure: TRANSESOPHAGEAL ECHOCARDIOGRAM (TEE);  Surgeon: Perla Evalene PARAS, MD;  Location: [facility] ORS;  Service: Cardiovascular;  Laterality: N/A;   TONSILLECTOMY AND ADENOIDECTOMY     TOOTH EXTRACTION     July 2023   VAGINAL HYSTERECTOMY  1970's    Family History  Problem Relation Age of Onset   Diabetes Mother    Heart disease Mother    Stroke Mother    Stroke Maternal Grandmother    Hyperlipidemia Neg Hx    Social History   Occupational History   Occupation: retired  Tobacco Use   Smoking status: Former    Current packs/day: 0.00    Average packs/day: 0.5 packs/day for 56.0 years (28.0 ttl pk-yrs)    Types: Cigarettes    Start date: 07/14/1960    Quit date: 07/14/2016    Years since quitting: 8.2   Smokeless tobacco: Never  Vaping Use   Vaping status: Never Used  Substance and Sexual Activity   Alcohol use: Yes    Alcohol/week: 1.0 - 2.0 standard drink of alcohol    Types: 1 - 2 Glasses of wine per week    Comment: 1 glass of wine 1-2 times per week   Drug use: No   Sexual activity: Not  Currently    Birth control/protection: Post-menopausal   Tobacco Counseling Counseling given: Not Answered  SDOH Screenings   Food Insecurity: No Food Insecurity (10/27/2024)  Housing: Low Risk (10/27/2024)  Transportation Needs: No Transportation Needs (10/27/2024)  Utilities: Not At Risk (10/27/2024)  Alcohol Screen: Low Risk (07/22/2023)  Depression (PHQ2-9): Medium Risk (10/27/2024)  Financial Resource Strain: Low Risk (07/22/2023)  Physical Activity: Insufficiently Active (10/27/2024)  Social Connections: Moderately Isolated (10/27/2024)  Stress: Stress Concern Present (10/27/2024)  Tobacco Use: Medium Risk (10/27/2024)  Health Literacy: Adequate Health Literacy (10/27/2024)   See flowsheets for full screening details  Depression Screen PHQ 2 & 9 Depression Scale- Over the past 2 weeks, how often have you been bothered by any of the following problems? Little interest or pleasure in doing things: 0 Feeling down, depressed, or hopeless (PHQ Adolescent also includes...irritable): 1 PHQ-2 Total Score: 1 Trouble falling or staying asleep, or sleeping too much: 3 Feeling tired or having little energy: 1 Poor appetite or  overeating (PHQ Adolescent also includes...weight loss): 1 Feeling bad about yourself - or that you are a failure or have let yourself or your family down: 0 Trouble concentrating on things, such as reading the newspaper or watching television (PHQ Adolescent also includes...like school work): 0 Moving or speaking so slowly that other people could have noticed. Or the opposite - being so fidgety or restless that you have been moving around a lot more than usual: 0 Thoughts that you would be better off dead, or of hurting yourself in some way: 0 PHQ-9 Total Score: 6 If you checked off any problems, how difficult have these problems made it for you to do your work, take care of things at home, or get along with other people?: Not difficult at all  Depression Treatment Depression Interventions/Treatment : Currently on Treatment     Goals Addressed               This Visit's Progress     lose weight (pt-stated)               Objective:    Today's Vitals   10/27/24 1438  Weight: 187 lb (84.8 kg)  Height: 5' 8.5 (1.74 m)   Body mass index is 28.02 kg/m.  Hearing/Vision screen Hearing Screening - Comments:: Some hearing loss Vision Screening - Comments:: UTD w/ Dr. Adine Novak [facility] [name] Immunizations and Health Maintenance Health Maintenance  Topic Date Due   Diabetic kidney evaluation - Urine ACR  11/12/2024   COVID-19 Vaccine (5 - 2025-26 season) 10/31/2024 (Originally 06/13/2024)   Bone Density Scan  11/12/2024 (Originally 08/05/2022)   Zoster Vaccines- Shingrix (1 of 2) 11/13/2024 (Originally 12/04/1964)   DTaP/Tdap/Td (2 - Tdap) 10/15/2025 (Originally 09/25/2024)   HEMOGLOBIN A1C  02/14/2025   OPHTHALMOLOGY EXAM  03/10/2025   FOOT EXAM  05/16/2025   Mammogram  05/17/2025   Diabetic kidney evaluation - eGFR measurement  08/17/2025   Medicare Annual Wellness (AWV)  10/27/2025   Colonoscopy  03/15/2031   Influenza Vaccine  Completed   Hepatitis C  Screening  Completed   Meningococcal B Vaccine  Aged Out   Lung Cancer Screening  Discontinued   Pneumococcal Vaccine: 50+ Years  Discontinued   Fecal DNA (Cologuard)  Discontinued        Assessment/Plan:  This is a routine wellness examination for [facility].  Patient Care Team: [name], Jolene T, NP as PCP - General (Nurse Practitioner) Gollan, Timothy J, MD as Consulting Physician (Cardiology) Abigail,  Bernardino HERO, PA-C as Physician Assistant (Cardiology) Myrna Adine Anes, MD as Consulting Physician (Ophthalmology) Tamea Dedra CROME, MD as Consulting Physician (Pulmonary Disease) Jackquline Sawyer, MD (Dermatology) Delores Orvin BRAVO, NP as Nurse Practitioner (Vascular Surgery) Blair Mt, MD as Referring Physician (Otolaryngology) Lane Arthea BRAVO, MD as Referring Physician (Neurology)  I have personally reviewed and noted the following in the patients chart:   Medical and social history Use of alcohol, tobacco or illicit drugs  Current medications and supplements including opioid prescriptions. Functional ability and status Nutritional status Physical activity Advanced directives List of other physicians Hospitalizations, surgeries, and ER visits in previous 12 months Vitals Screenings to include cognitive, depression, and falls Referrals and appointments  Orders Placed This Encounter  Procedures   HM MAMMOGRAPHY    This external order was created through the Results Console.   DG Bone Density    Standing Status:   Future    Expected Date:   10/28/2024    Expiration Date:   10/27/2025    Scheduling Instructions:     [facility]    Reason for Exam (SYMPTOM  OR DIAGNOSIS REQUIRED):   postmenopausal estrogen deficiency    Preferred imaging location?:   External   In addition, I have reviewed and discussed with patient certain preventive protocols, quality metrics, and best practice recommendations. A written personalized care plan for preventive services as well as  general preventive health recommendations were provided to patient.   Vina Ned, CMA   10/27/2024   Return in 53 weeks (on 11/02/2025) for Medicare Annual Wellness Visit.  After Visit Summary: (MyChart) Due to this being a telephonic visit, the after visit summary with patients personalized plan was offered to patient via MyChart   Nurse Notes:  Abstracted results of last MMG from 05/17/24 [facility]) Placed order for DEXA [facility]) Needs Covid vaccine at next OV on 11/18/24 Needs Tdap vaccine (pharmacy) Declined shingles vaccine Declined DM & Nutrition education referral Screening colonoscopy no longer recommended due to age.  "

## 2024-10-27 NOTE — Patient Instructions (Signed)
 Ms. Toppins,  Thank you for taking the time for your Medicare Wellness Visit. I appreciate your continued commitment to your health goals. Please review the care plan we discussed, and feel free to reach out if I can assist you further.  Please note that Annual Wellness Visits do not include a physical exam. Some assessments may be limited, especially if the visit was conducted virtually. If needed, we may recommend an in-person follow-up with your provider.  Ongoing Care Seeing your primary care provider every 3 to 6 months helps us  monitor your health and provide consistent, personalized care.   Referrals If a referral was made during today's visit and you haven't received any updates within two weeks, please contact the referred provider directly to check on the status.  Recommended Screenings:  You may get the covid vaccine at your next OV on 11/18/24.  You may get a tetanus vaccine at your local pharmacy at your convenience.  Please call to schedule your bone density scan at [facility] at your convenience. Their ph# is [phone].    Health Maintenance  Topic Date Due   Medicare Annual Wellness Visit  07/21/2024   Kidney health urinalysis for diabetes  11/12/2024   COVID-19 Vaccine (5 - 2025-26 season) 10/31/2024*   Osteoporosis screening with Bone Density Scan  11/12/2024*   Zoster (Shingles) Vaccine (1 of 2) 11/13/2024*   Breast Cancer Screening  02/11/2025*   DTaP/Tdap/Td vaccine (2 - Tdap) 10/15/2025*   Hemoglobin A1C  02/14/2025   Eye exam for diabetics  03/10/2025   Complete foot exam   05/16/2025   Yearly kidney function blood test for diabetes  08/17/2025   Colon Cancer Screening  03/15/2031   Flu Shot  Completed   Hepatitis C Screening  Completed   Meningitis B Vaccine  Aged Out   Screening for Lung Cancer  Discontinued   Pneumococcal Vaccine for age over 44  Discontinued   Cologuard (Stool DNA test)  Discontinued  *Topic was postponed. The date shown is  not the original due date.       10/27/2024    2:49 PM  Advanced Directives  Does Patient Have a Medical Advance Directive? No  Would patient like information on creating a medical advance directive? Yes (MAU/Ambulatory/Procedural Areas - Information given)  Information on Advanced Care Planning can be found at [place]  Secretary of [facility] Advance Health Care Directives Advance Health Care Directives ([email or web address]  You may also get the forms at your doctor's office.  Vision: Annual vision screenings are recommended for early detection of glaucoma, cataracts, and diabetic retinopathy. These exams can also reveal signs of chronic conditions such as diabetes and high blood pressure.  Dental: Annual dental screenings help detect early signs of oral cancer, gum disease, and other conditions linked to overall health, including heart disease and diabetes.  Please see the attached documents for additional preventive care recommendations.

## 2024-10-28 ENCOUNTER — Other Ambulatory Visit: Payer: Self-pay | Admitting: Nurse Practitioner

## 2024-10-28 ENCOUNTER — Ambulatory Visit (INDEPENDENT_AMBULATORY_CARE_PROVIDER_SITE_OTHER)

## 2024-10-28 DIAGNOSIS — E538 Deficiency of other specified B group vitamins: Secondary | ICD-10-CM | POA: Diagnosis not present

## 2024-10-28 MED ORDER — AZITHROMYCIN 250 MG PO TABS: ORAL_TABLET | ORAL | 0 refills | Status: AC

## 2024-10-28 MED ORDER — CYANOCOBALAMIN 1000 MCG/ML IJ SOLN
1000.0000 ug | Freq: Once | INTRAMUSCULAR | Status: AC
  Administered 2024-10-28: 1000 ug via INTRAMUSCULAR

## 2024-10-28 NOTE — Progress Notes (Signed)
 Patient is in office today for a nurse visit for B12 Injection. Patient Injection was given in the  Left deltoid. Patient tolerated injection well.

## 2024-10-28 NOTE — Progress Notes (Signed)
 Husband in hospital, end of life, she is requesting Zpack just in case as she is around the people in the hospital.

## 2024-11-04 MED ORDER — CEFDINIR 300 MG PO CAPS: 300.0000 mg | ORAL_CAPSULE | Freq: Two times a day (BID) | ORAL | 0 refills | Status: AC

## 2024-11-04 NOTE — Addendum Note (Signed)
 Addended by: Deylan Canterbury T on: 11/04/2024 12:07 PM   Modules accepted: Orders

## 2024-11-15 ENCOUNTER — Ambulatory Visit: Admitting: Nurse Practitioner

## 2024-11-15 ENCOUNTER — Telehealth: Payer: Self-pay | Admitting: Nurse Practitioner

## 2024-11-15 NOTE — Telephone Encounter (Signed)
 Called patient to get her rescheduled. She said she will call back to schedule.

## 2024-11-18 ENCOUNTER — Ambulatory Visit: Admitting: Nurse Practitioner

## 2024-11-29 ENCOUNTER — Ambulatory Visit

## 2024-12-22 ENCOUNTER — Ambulatory Visit: Admitting: Pulmonary Disease

## 2024-12-27 ENCOUNTER — Ambulatory Visit

## 2025-01-27 ENCOUNTER — Ambulatory Visit

## 2025-02-27 ENCOUNTER — Ambulatory Visit

## 2025-04-04 ENCOUNTER — Ambulatory Visit (INDEPENDENT_AMBULATORY_CARE_PROVIDER_SITE_OTHER): Admitting: Nurse Practitioner

## 2025-04-04 ENCOUNTER — Other Ambulatory Visit (INDEPENDENT_AMBULATORY_CARE_PROVIDER_SITE_OTHER)

## 2025-04-04 ENCOUNTER — Ambulatory Visit: Admitting: Physician Assistant

## 2025-11-02 ENCOUNTER — Ambulatory Visit
